# Patient Record
Sex: Female | Born: 1955
Health system: Southern US, Community
[De-identification: ages and names within clinical notes are randomized; demographics above are authoritative.]

## PROBLEM LIST (undated history)

## (undated) DIAGNOSIS — F32A Depression, unspecified: Secondary | ICD-10-CM

## (undated) DIAGNOSIS — I10 Essential (primary) hypertension: Secondary | ICD-10-CM

## (undated) DIAGNOSIS — R519 Headache, unspecified: Secondary | ICD-10-CM

## (undated) DIAGNOSIS — R011 Cardiac murmur, unspecified: Secondary | ICD-10-CM

## (undated) DIAGNOSIS — R0600 Dyspnea, unspecified: Secondary | ICD-10-CM

## (undated) DIAGNOSIS — F1910 Other psychoactive substance abuse, uncomplicated: Secondary | ICD-10-CM

## (undated) DIAGNOSIS — K219 Gastro-esophageal reflux disease without esophagitis: Secondary | ICD-10-CM

## (undated) DIAGNOSIS — T7840XA Allergy, unspecified, initial encounter: Secondary | ICD-10-CM

## (undated) DIAGNOSIS — M1990 Unspecified osteoarthritis, unspecified site: Secondary | ICD-10-CM

## (undated) DIAGNOSIS — F1010 Alcohol abuse, uncomplicated: Secondary | ICD-10-CM

## (undated) DIAGNOSIS — E785 Hyperlipidemia, unspecified: Secondary | ICD-10-CM

## (undated) DIAGNOSIS — D649 Anemia, unspecified: Secondary | ICD-10-CM

## (undated) DIAGNOSIS — G629 Polyneuropathy, unspecified: Secondary | ICD-10-CM

## (undated) DIAGNOSIS — R0902 Hypoxemia: Secondary | ICD-10-CM

## (undated) DIAGNOSIS — T884XXA Failed or difficult intubation, initial encounter: Secondary | ICD-10-CM

## (undated) DIAGNOSIS — R000 Tachycardia, unspecified: Secondary | ICD-10-CM

## (undated) DIAGNOSIS — G709 Myoneural disorder, unspecified: Secondary | ICD-10-CM

## (undated) DIAGNOSIS — Z9689 Presence of other specified functional implants: Secondary | ICD-10-CM

## (undated) DIAGNOSIS — H269 Unspecified cataract: Secondary | ICD-10-CM

## (undated) DIAGNOSIS — F419 Anxiety disorder, unspecified: Secondary | ICD-10-CM

## (undated) DIAGNOSIS — J449 Chronic obstructive pulmonary disease, unspecified: Secondary | ICD-10-CM

## (undated) DIAGNOSIS — M542 Cervicalgia: Secondary | ICD-10-CM

## (undated) DIAGNOSIS — F329 Major depressive disorder, single episode, unspecified: Secondary | ICD-10-CM

## (undated) HISTORY — DX: Depression, unspecified: F32.A

## (undated) HISTORY — DX: Tachycardia, unspecified: R00.0

## (undated) HISTORY — PX: ANTERIOR CERVICAL DECOMP/DISCECTOMY FUSION: SHX1161

## (undated) HISTORY — DX: Anemia, unspecified: D64.9

## (undated) HISTORY — PX: OTHER SURGICAL HISTORY: SHX169

## (undated) HISTORY — DX: Hyperlipidemia, unspecified: E78.5

## (undated) HISTORY — DX: Unspecified cataract: H26.9

## (undated) HISTORY — DX: Cervicalgia: M54.2

## (undated) HISTORY — DX: Myoneural disorder, unspecified: G70.9

## (undated) HISTORY — DX: Other psychoactive substance abuse, uncomplicated: F19.10

## (undated) HISTORY — PX: JOINT REPLACEMENT: SHX530

## (undated) HISTORY — DX: Major depressive disorder, single episode, unspecified: F32.9

## (undated) HISTORY — PX: SPINE SURGERY: SHX786

## (undated) HISTORY — DX: Gastro-esophageal reflux disease without esophagitis: K21.9

## (undated) HISTORY — PX: ESOPHAGOGASTRODUODENOSCOPY: SHX1529

## (undated) HISTORY — DX: Allergy, unspecified, initial encounter: T78.40XA

## (undated) HISTORY — DX: Hypoxemia: R09.02

## (undated) HISTORY — PX: ROTATOR CUFF REPAIR: SHX139

---
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
## 1979-08-18 HISTORY — PX: BREAST ENHANCEMENT SURGERY: SHX7

## 1980-08-17 HISTORY — PX: AUGMENTATION MAMMAPLASTY: SUR837

## 1992-08-17 DIAGNOSIS — K7460 Unspecified cirrhosis of liver: Secondary | ICD-10-CM

## 1992-08-17 HISTORY — DX: Unspecified cirrhosis of liver: K74.60

## 2004-05-26 ENCOUNTER — Ambulatory Visit: Payer: Self-pay | Admitting: Internal Medicine

## 2004-06-05 ENCOUNTER — Ambulatory Visit: Payer: Self-pay | Admitting: Internal Medicine

## 2005-04-03 ENCOUNTER — Ambulatory Visit: Payer: Self-pay

## 2006-06-17 ENCOUNTER — Ambulatory Visit: Payer: Self-pay

## 2007-06-26 DIAGNOSIS — F32A Depression, unspecified: Secondary | ICD-10-CM | POA: Insufficient documentation

## 2007-06-26 DIAGNOSIS — B001 Herpesviral vesicular dermatitis: Secondary | ICD-10-CM | POA: Insufficient documentation

## 2007-06-26 DIAGNOSIS — G4700 Insomnia, unspecified: Secondary | ICD-10-CM | POA: Insufficient documentation

## 2007-06-26 DIAGNOSIS — M19049 Primary osteoarthritis, unspecified hand: Secondary | ICD-10-CM | POA: Insufficient documentation

## 2007-06-26 DIAGNOSIS — F329 Major depressive disorder, single episode, unspecified: Secondary | ICD-10-CM

## 2007-06-26 DIAGNOSIS — N951 Menopausal and female climacteric states: Secondary | ICD-10-CM | POA: Insufficient documentation

## 2007-06-26 DIAGNOSIS — F1011 Alcohol abuse, in remission: Secondary | ICD-10-CM | POA: Insufficient documentation

## 2007-06-26 DIAGNOSIS — K5900 Constipation, unspecified: Secondary | ICD-10-CM | POA: Insufficient documentation

## 2007-06-26 DIAGNOSIS — R000 Tachycardia, unspecified: Secondary | ICD-10-CM | POA: Insufficient documentation

## 2007-09-07 ENCOUNTER — Ambulatory Visit: Payer: Self-pay | Admitting: Family Medicine

## 2007-09-09 DIAGNOSIS — E539 Vitamin B deficiency, unspecified: Secondary | ICD-10-CM | POA: Insufficient documentation

## 2007-10-03 ENCOUNTER — Ambulatory Visit: Payer: Self-pay | Admitting: Orthopaedic Surgery

## 2008-07-18 ENCOUNTER — Ambulatory Visit: Payer: Self-pay | Admitting: Anesthesiology

## 2008-07-31 ENCOUNTER — Ambulatory Visit: Payer: Self-pay | Admitting: Anesthesiology

## 2008-08-21 ENCOUNTER — Ambulatory Visit: Payer: Self-pay | Admitting: Anesthesiology

## 2008-09-19 ENCOUNTER — Ambulatory Visit: Payer: Self-pay | Admitting: Anesthesiology

## 2008-10-12 ENCOUNTER — Ambulatory Visit: Payer: Self-pay | Admitting: Anesthesiology

## 2008-12-05 ENCOUNTER — Ambulatory Visit: Payer: Self-pay | Admitting: Anesthesiology

## 2009-01-02 ENCOUNTER — Ambulatory Visit: Payer: Self-pay | Admitting: Anesthesiology

## 2009-03-04 ENCOUNTER — Ambulatory Visit (HOSPITAL_COMMUNITY): Admission: RE | Admit: 2009-03-04 | Discharge: 2009-03-04 | Payer: Self-pay | Admitting: Neurosurgery

## 2010-04-10 ENCOUNTER — Encounter: Admission: RE | Admit: 2010-04-10 | Discharge: 2010-04-10 | Payer: Self-pay | Admitting: Neurosurgery

## 2010-07-24 ENCOUNTER — Ambulatory Visit: Payer: Self-pay

## 2010-08-27 ENCOUNTER — Encounter: Payer: Self-pay | Admitting: Cardiovascular Disease

## 2010-08-27 ENCOUNTER — Encounter: Payer: Self-pay | Admitting: Internal Medicine

## 2010-08-28 ENCOUNTER — Encounter: Payer: Self-pay | Admitting: Cardiovascular Disease

## 2010-09-22 ENCOUNTER — Ambulatory Visit (INDEPENDENT_AMBULATORY_CARE_PROVIDER_SITE_OTHER): Payer: BC Managed Care – PPO | Admitting: Cardiovascular Disease

## 2010-09-22 ENCOUNTER — Encounter: Payer: Self-pay | Admitting: Cardiovascular Disease

## 2010-09-22 DIAGNOSIS — R9431 Abnormal electrocardiogram [ECG] [EKG]: Secondary | ICD-10-CM | POA: Insufficient documentation

## 2010-09-22 DIAGNOSIS — F17200 Nicotine dependence, unspecified, uncomplicated: Secondary | ICD-10-CM | POA: Insufficient documentation

## 2010-09-22 DIAGNOSIS — R000 Tachycardia, unspecified: Secondary | ICD-10-CM

## 2010-09-22 DIAGNOSIS — I471 Supraventricular tachycardia: Secondary | ICD-10-CM | POA: Insufficient documentation

## 2010-09-22 DIAGNOSIS — R079 Chest pain, unspecified: Secondary | ICD-10-CM | POA: Insufficient documentation

## 2010-09-22 DIAGNOSIS — E785 Hyperlipidemia, unspecified: Secondary | ICD-10-CM

## 2010-10-02 NOTE — Assessment & Plan Note (Signed)
Summary: NP6/CP/AMD   Visit Type:  Initial Consult Primary Provider:  Dr. Nilda Simmer  CC:  c/o fast heart rate and SOB 3 weeks ago. Denies chest pain and pt has not had any symptoms since.Marland Kitchen  History of Present Illness: Gabrielle Pineda is a very pleasant 55 old woman, patient of Dr. Katrinka Blazing and Sharen Hint, with a long history of smoking since age 55, hyperlipidemia, history of tachycardia arrhythmia who presents by referral for her chest pain.  She reports that she has had 3 episodes of tachyarrhythmia in the past. They are not frequent. The most recent episode occurred several weeks ago lasting for 3-1/2 hours. 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 She was able to see her heart beating out of her chest and difficulty breathing with chest pain radiating to her left arm.It went away without intervention.  In the past, she has had episodes are similar requiring an IV medication that I believe is adenosine given by EMTs. The shot helped to break her rhythm back to normal rhythm. Last episode was 6 years ago.  She is active at work though does not perform an exercise program.  The chest pain was described as a tightness in her left chest, radiating into his left arm, while she was having her fast rhythm. Since then she has felt well.  EKG shows normal sinus rhythm with a rate of 67 beats per minute with old septal infarct  Current Medications (verified): 1)  Klor-Con M20 20 Meq Cr-Tabs (Potassium Chloride Crys Cr) .Marland Kitchen.. 1 Tablet Once Daily 2)  Trazodone Hcl 50 Mg Tabs (Trazodone Hcl) .Marland Kitchen.. 1-2 Tablets At Bedtime 3)  Folic Acid 1 Mg Tabs (Folic Acid) .Marland Kitchen.. 1 Tablet Once Daily 4)  Multivitamins  Tabs (Multiple Vitamin) .Marland Kitchen.. 1 Tablet Once Daily 5)  Effexor Xr 75 Mg Xr24h-Cap (Venlafaxine Hcl) .... 3 Tablets Three Times A Day 6)  B Complex  Tabs (B Complex Vitamins) .Marland Kitchen.. 1 Tablet Once Daily 7)  Vitamins A & D 10000-400 Unit Caps (Vitamins  A & D) .Marland Kitchen.. 1 Tablet Once Daily 8)  Vegetable Laxative 63 % Powd (Psyllium) .... Every Day 9)  Fluticasone Propionate 50 Mcg/act Susp (Fluticasone Propionate) .... 2 Nasal Sprays Once Daily 10)  Xanax 0.5 Mg Tabs (Alprazolam) .Marland Kitchen.. 1 Tablet Every Six Hours As Needed For Muscle Spasms 11)  Hydrocodone-Acetaminophen 10-325 Mg Tabs (Hydrocodone-Acetaminophen) .Marland Kitchen.. 1 1/2 Tablet Every 4 To 6 Hours As Needed 12)  Valtrex 1 Gm Tabs (Valacyclovir Hcl) .... 2 Tablets Two Times A Day For 5 Days As Needed 13)  Mucinex Dm 30-600 Mg Xr12h-Tab (Dextromethorphan-Guaifenesin) .Marland Kitchen.. 1 Daily As Needed 14)  Acid Reducer 150mg  .... 1 Tablet As Needed 15)  Advil 200 Mg Tabs (Ibuprofen) .... As Needed 16)  Extra Strength Acetaminophen 500 Mg Caps (Acetaminophen) .... As Needed  Allergies (verified): No Known Drug Allergies  Past History:  Past Medical History: Last updated: 10-19-2010 Cervicalgia Depression GERD Anemia Tachycardia  Past Surgical History: Last updated: 2010-10-19 Neck Disc Surgery-plates and screws in neck Breast augmentation-1981 EGD Tonsils and adenoids removed-1973  Family History: Last updated: 10/19/2010 Father: deceased-pancreatic cancer, substance abuse-67 Mother: deceased-suicide,substance abuse, bipolar,drug overdose-38  Social History: Last updated: 10/19/10 Full Time Married  Tobacco Use -yes-smoked for 30-35 years Alcohol Use - no-recovering alcoholic-since 1994 Regular Exercise - no Drug Use - no  Risk Factors: Exercise: no (October 19, 2010)  Risk Factors: Smoking Status: quit (10/19/2010)  Social History: Full Time Married  Tobacco Use -yes-smoked for 30-35 years Alcohol Use - no-recovering alcoholic-since 1994 Regular Exercise - no Drug Use - no  Review of Systems       The patient complains of chest pain.  The patient denies fever, weight loss, weight gain, vision loss, decreased hearing, hoarseness, syncope, dyspnea on exertion, peripheral edema,  prolonged cough, abdominal pain, incontinence, muscle weakness, depression, and enlarged lymph nodes.         tachycardia  Vital Signs:  Patient profile:   55 year old female Height:      69 inches Weight:      145.50 pounds BMI:     21.56 Pulse rate:   67 / minute BP sitting:   148 / 86  (left arm) Cuff size:   regular  Vitals Entered By: Lysbeth Galas CMA (September 22, 2010 2:32 PM)  Physical Exam  General:  Well developed, well nourished, in no acute distress. Head:  normocephalic and atraumatic Neck:  Neck supple, no JVD. No masses, thyromegaly or abnormal cervical nodes. Lungs:  Clear bilaterally to auscultation and percussion. Heart:  Non-displaced PMI, chest non-tender; regular rate and rhythm, S1, S2 without murmurs, rubs or gallops. Carotid upstroke normal, no bruit. Pedals normal pulses. No edema, no varicosities. Abdomen:  Bowel sounds positive; abdomen soft and non-tender without masses Msk:  Back normal, normal gait. Muscle strength and tone normal. Pulses:  pulses normal in all 4 extremities Extremities:  No clubbing or cyanosis. Neurologic:  Alert and oriented x 3. Skin:  Intact without lesions or rashes. Psych:  Normal affect.   Impression & Recommendations:  Problem # 1:  CHEST PAIN UNSPECIFIED (ICD-786.50)  etiology for chest pain is concerning for underlying coronary artery disease. It is uncertain if this was angina exacerbated by her rapid heart rate. She may have underlying coronary artery disease as she does have significant risk factors with close to 40 years of smoking and very elevated cholesterol. Only recently started on a cholesterol medication.  She has chest pain, abnormal EKG, hyperlipidemia and long smoking history. we will add aspirin 81 mg daily.  We will schedule her for a treadmill Myoview to be done at her convenience. She would like to do this on the week that she is off in March. I suggested that she contact us if she has further  symptoms and we would set up the test prior to then.  Her updated medication list for this problem includes:    Aspir-low 81 Mg Tbec (Aspirin) .Marland Kitchen... Take one tablet once daily  Her updated medication list for this problem includes:    Aspir-low 81 Mg Tbec (Aspirin) .Marland Kitchen... Take one tablet once daily  Orders: Nuclear Stress Test (Nuc Stress Test)  Problem # 2:  TACHYCARDIA (ICD-785) I suspect her tachycardia could be secondary to an SVT or atrial tachycardia. She reports previous episodes terminated by injection of the medication which I suspect is the adenosine.  She does not want to start on a medication for rhythm control at this time. We did teach her maneuvers such as carotid sinus massage and Valsalva to help break her rhythm. I suggested she try to capture her rhythm on a telemetry strip either by EMTs, fire department or emergency room the next time she has arrhythmia.  Problem # 3:  HYPERLIPIDEMIA (ICD-272.4)  I suggested that she stay on her cholesterol medication, recheck her cholesterol in 2 months time with further medication adjustment. Ideal goal LDL should be less than 100.  Orders: Nuclear  Stress Test (Nuc Stress Test)  Problem # 4:  SMOKER (ICD-305.1) we have talked to her about her smoking. I suggested she followup with Dr. Katrinka Blazing. Options include Chantix, nicotine patches, electronic cigarette. I did discuss the complications of smoking, including higher heart attack and stroke risk.  Other Orders: EKG w/ Interpretation (93000)  Patient Instructions: 1)  Your physician recommends that you schedule a follow-up appointment in: You will be called with Myoview results. 2)  Your physician has requested that you have an exercise stress myoview.  For further information please visit [email or web address]  Please follow instruction sheet, as given. 3)  Your physician recommends that you continue on your current medications as directed. Please refer to the Current Medication  list given to you today.

## 2010-10-28 ENCOUNTER — Telehealth (INDEPENDENT_AMBULATORY_CARE_PROVIDER_SITE_OTHER): Payer: Self-pay | Admitting: *Deleted

## 2010-10-28 NOTE — Letter (Signed)
Summary: [facility] Office Visit Note   [facility] Office Visit Note   Imported By: Roderic Ovens 10/20/2010 15:13:55  _____________________________________________________________________  External Attachment:    Type:   Image     Comment:   External Document

## 2010-10-29 ENCOUNTER — Ambulatory Visit (HOSPITAL_COMMUNITY): Payer: BC Managed Care – PPO | Attending: Cardiology

## 2010-10-29 ENCOUNTER — Encounter: Payer: Self-pay | Admitting: *Deleted

## 2010-10-29 ENCOUNTER — Encounter: Payer: Self-pay | Admitting: Cardiology

## 2010-10-29 ENCOUNTER — Encounter (INDEPENDENT_AMBULATORY_CARE_PROVIDER_SITE_OTHER): Payer: Self-pay | Admitting: *Deleted

## 2010-10-29 DIAGNOSIS — R0609 Other forms of dyspnea: Secondary | ICD-10-CM

## 2010-10-29 DIAGNOSIS — R9431 Abnormal electrocardiogram [ECG] [EKG]: Secondary | ICD-10-CM | POA: Insufficient documentation

## 2010-10-29 DIAGNOSIS — R0989 Other specified symptoms and signs involving the circulatory and respiratory systems: Secondary | ICD-10-CM

## 2010-10-29 DIAGNOSIS — R079 Chest pain, unspecified: Secondary | ICD-10-CM | POA: Insufficient documentation

## 2010-10-29 DIAGNOSIS — I498 Other specified cardiac arrhythmias: Secondary | ICD-10-CM | POA: Insufficient documentation

## 2010-10-29 DIAGNOSIS — E785 Hyperlipidemia, unspecified: Secondary | ICD-10-CM | POA: Insufficient documentation

## 2010-10-29 DIAGNOSIS — R0602 Shortness of breath: Secondary | ICD-10-CM

## 2010-11-04 NOTE — Assessment & Plan Note (Addendum)
Summary: Cardiology Nuclear Testing  Nuclear Med Background Indications for Stress Test: Evaluation for Ischemia, Abnormal EKG     Symptoms: Chest Pain, Chest Tightness, DOE, Fatigue, Palpitations, Rapid HR    Nuclear Pre-Procedure Cardiac Risk Factors: Lipids, Smoker Caffeine/Decaff Intake: None NPO After: 8:30 PM Lungs: clear IV 0.9% NS with Angio Cath: 18g     IV Site: R Antecubital IV Started by: Stanton Kidney, EMT-P Chest Size (in) 38     Cup Size B     Height (in): 69 Weight (lb): 136 BMI: 20.16  Nuclear Med Study 1 or 2 day study:  1 day     Stress Test Type:  Stress Reading MD:  Cassell Clement, MD     Referring MD:  T.Gollan Resting Radionuclide:  Technetium 56m Tetrofosmin     Resting Radionuclide Dose:  11 mCi  Stress Radionuclide:  Technetium 37m Tetrofosmin     Stress Radionuclide Dose:  33 mCi   Stress Protocol Exercise Time (min):  5:30 min     Max HR:  150 bpm     Predicted Max HR:  166 bpm  Max Systolic BP: 175 mm Hg     Percent Max HR:  90.36 %     METS: 7.0 Rate Pressure Product:  16109    Stress Test Technologist:  Milana Na, EMT-P     Nuclear Technologist:  Domenic Polite, CNMT  Rest Procedure  Myocardial perfusion imaging was performed at rest 45 minutes following the intravenous administration of Technetium 68m Tetrofosmin.  Stress Procedure  The patient exercised for 5:30. The patient stopped due to fatigue and denied any chest pain.  There were non specific ST-T wave changes.  Technetium 69m Tetrofosmin was injected at peak exercise and myocardial perfusion imaging was performed after a brief delay.  QPS Raw Data Images:  Normal; no motion artifact; normal heart/lung ratio. Stress Images:  Normal homogeneous uptake in all areas of the myocardium. Rest Images:  Normal homogeneous uptake in all areas of the myocardium. Subtraction (SDS):  No evidence of ischemia. Transient Ischemic Dilatation:  1.08  (Normal <1.22)  Lung/Heart Ratio:  .31   (Normal <0.45)  Quantitative Gated Spect Images QGS EDV:  68 ml QGS ESV:  31 ml QGS EF:  55 % QGS cine images:  Normal wall motion.  Findings Normal nuclear study      Overall Impression  Exercise Capacity: Fair exercise capacity. BP Response: Normal blood pressure response. Clinical Symptoms: No chest pain ECG Impression: No significant ST segment change suggestive of ischemia. Overall Impression: Normal stress nuclear study.  Appended Document: Cardiology Nuclear Testing Normal stress test  Appended Document: Cardiology Nuclear Testing [facility] TCB/sab  Appended Document: Cardiology Nuclear Testing pt notified/sab

## 2010-11-04 NOTE — Progress Notes (Signed)
Summary: Nuclear Pre-Procedure  Phone Note Outgoing Call Call back at [facility] Phone [phone]   Call placed by: Stanton Kidney, EMT-P,  October 28, 2010 2:48 PM Call placed to: Patient Action Taken: Phone Call Completed Summary of Call: Reviewed information on Myoview Information Sheet (see scanned document for further details).  Spoke with the patient. Stanton Kidney, EMT-P  October 28, 2010 2:51 PM      Nuclear Med Background Indications for Stress Test: Evaluation for Ischemia, Abnormal EKG     Symptoms: Chest Pain, Chest Tightness, Rapid HR    Nuclear Pre-Procedure Cardiac Risk Factors: Lipids, Smoker Height (in): 69

## 2011-02-12 ENCOUNTER — Ambulatory Visit: Payer: Self-pay | Admitting: Family Medicine

## 2011-03-02 ENCOUNTER — Encounter: Payer: Self-pay | Admitting: Cardiovascular Disease

## 2011-03-24 ENCOUNTER — Ambulatory Visit: Payer: Self-pay | Admitting: Family Medicine

## 2011-03-26 ENCOUNTER — Ambulatory Visit: Payer: Self-pay | Admitting: Family Medicine

## 2011-04-17 ENCOUNTER — Ambulatory Visit: Payer: Self-pay | Admitting: Unknown Physician Specialty

## 2011-04-23 LAB — PATHOLOGY REPORT

## 2011-05-21 ENCOUNTER — Ambulatory Visit: Payer: Self-pay | Admitting: Family Medicine

## 2011-08-18 HISTORY — PX: CARPAL TUNNEL RELEASE: SHX101

## 2011-11-11 ENCOUNTER — Other Ambulatory Visit: Payer: Self-pay

## 2011-11-11 ENCOUNTER — Encounter (HOSPITAL_COMMUNITY): Payer: Self-pay | Admitting: *Deleted

## 2011-11-11 ENCOUNTER — Encounter (HOSPITAL_COMMUNITY): Payer: Self-pay | Admitting: Pharmacy Technician

## 2011-11-11 ENCOUNTER — Ambulatory Visit (HOSPITAL_COMMUNITY)
Admission: AD | Admit: 2011-11-11 | Discharge: 2011-11-11 | Disposition: A | Discharge status: Home / Self Care | Payer: BC Managed Care – PPO | Source: Ambulatory Visit | Attending: Neurosurgery | Admitting: Neurosurgery

## 2011-11-11 ENCOUNTER — Encounter (HOSPITAL_COMMUNITY)
Admission: AD | Disposition: A | Discharge status: Home / Self Care | Payer: Self-pay | Source: Ambulatory Visit | Attending: Neurosurgery

## 2011-11-11 ENCOUNTER — Ambulatory Visit (HOSPITAL_COMMUNITY): Payer: BC Managed Care – PPO | Admitting: Anesthesiology

## 2011-11-11 ENCOUNTER — Other Ambulatory Visit: Payer: Self-pay | Admitting: Neurosurgery

## 2011-11-11 ENCOUNTER — Encounter (HOSPITAL_COMMUNITY): Payer: Self-pay | Admitting: Anesthesiology

## 2011-11-11 ENCOUNTER — Encounter (HOSPITAL_COMMUNITY): Payer: BC Managed Care – PPO

## 2011-11-11 DIAGNOSIS — G5600 Carpal tunnel syndrome, unspecified upper limb: Secondary | ICD-10-CM | POA: Insufficient documentation

## 2011-11-11 DIAGNOSIS — Z01812 Encounter for preprocedural laboratory examination: Secondary | ICD-10-CM | POA: Insufficient documentation

## 2011-11-11 DIAGNOSIS — Z01810 Encounter for preprocedural cardiovascular examination: Secondary | ICD-10-CM | POA: Insufficient documentation

## 2011-11-11 DIAGNOSIS — F329 Major depressive disorder, single episode, unspecified: Secondary | ICD-10-CM | POA: Insufficient documentation

## 2011-11-11 DIAGNOSIS — K219 Gastro-esophageal reflux disease without esophagitis: Secondary | ICD-10-CM | POA: Insufficient documentation

## 2011-11-11 DIAGNOSIS — F3289 Other specified depressive episodes: Secondary | ICD-10-CM | POA: Insufficient documentation

## 2011-11-11 DIAGNOSIS — Z01818 Encounter for other preprocedural examination: Secondary | ICD-10-CM | POA: Insufficient documentation

## 2011-11-11 DIAGNOSIS — J449 Chronic obstructive pulmonary disease, unspecified: Secondary | ICD-10-CM | POA: Insufficient documentation

## 2011-11-11 DIAGNOSIS — I499 Cardiac arrhythmia, unspecified: Secondary | ICD-10-CM | POA: Insufficient documentation

## 2011-11-11 DIAGNOSIS — J4489 Other specified chronic obstructive pulmonary disease: Secondary | ICD-10-CM | POA: Insufficient documentation

## 2011-11-11 HISTORY — PX: CARPAL TUNNEL RELEASE: SHX101

## 2011-11-11 HISTORY — DX: Cardiac murmur, unspecified: R01.1

## 2011-11-11 LAB — CBC
HCT: 34.5 % — ABNORMAL LOW (ref 36.0–46.0)
Hemoglobin: 11.5 g/dL — ABNORMAL LOW (ref 12.0–15.0)
MCHC: 33.3 g/dL (ref 30.0–36.0)
MCV: 97.7 fL (ref 78.0–100.0)
RDW: 12.3 % (ref 11.5–15.5)
WBC: 7.1 10*3/uL (ref 4.0–10.5)

## 2011-11-11 LAB — BASIC METABOLIC PANEL
BUN: 5 mg/dL — ABNORMAL LOW (ref 6–23)
CO2: 24 mEq/L (ref 19–32)
Chloride: 108 mEq/L (ref 96–112)
Creatinine, Ser: 0.62 mg/dL (ref 0.50–1.10)

## 2011-11-11 SURGERY — CARPAL TUNNEL RELEASE
Anesthesia: Monitor Anesthesia Care | Site: Arm Lower | Laterality: Right | Wound class: Clean

## 2011-11-11 MED ORDER — CEFAZOLIN SODIUM 1-5 GM-% IV SOLN: 1.0000 g | INTRAVENOUS | Status: DC

## 2011-11-11 MED ORDER — CEFAZOLIN SODIUM 1-5 GM-% IV SOLN
INTRAVENOUS | Status: AC
  Filled 2011-11-11: qty 50

## 2011-11-11 MED ORDER — MORPHINE SULFATE 2 MG/ML IJ SOLN: 0.0500 mg/kg | INTRAMUSCULAR | Status: DC | PRN

## 2011-11-11 MED ORDER — MIDAZOLAM HCL 5 MG/5ML IJ SOLN
INTRAMUSCULAR | Status: DC | PRN
  Administered 2011-11-11: 2 mg via INTRAVENOUS

## 2011-11-11 MED ORDER — PROPOFOL 10 MG/ML IV EMUL
INTRAVENOUS | Status: DC | PRN
  Administered 2011-11-11: 80 mg via INTRAVENOUS

## 2011-11-11 MED ORDER — BUPIVACAINE-EPINEPHRINE 0.5% -1:200000 IJ SOLN
INTRAMUSCULAR | Status: DC | PRN
  Administered 2011-11-11: 20 mL

## 2011-11-11 MED ORDER — LACTATED RINGERS IV SOLN
INTRAVENOUS | Status: DC | PRN
  Administered 2011-11-11: 15:00:00 via INTRAVENOUS

## 2011-11-11 MED ORDER — CEFAZOLIN SODIUM 1-5 GM-% IV SOLN
INTRAVENOUS | Status: DC | PRN
  Administered 2011-11-11: 1 g via INTRAVENOUS

## 2011-11-11 MED ORDER — FENTANYL CITRATE 0.05 MG/ML IJ SOLN
INTRAMUSCULAR | Status: DC | PRN
  Administered 2011-11-11 (×2): 50 ug via INTRAVENOUS

## 2011-11-11 MED ORDER — MUPIROCIN 2 % EX OINT
TOPICAL_OINTMENT | Freq: Two times a day (BID) | CUTANEOUS | Status: DC
  Administered 2011-11-11: 1 via NASAL
  Filled 2011-11-11: qty 22

## 2011-11-11 MED ORDER — LIDOCAINE HCL (CARDIAC) 20 MG/ML IV SOLN
INTRAVENOUS | Status: DC | PRN
  Administered 2011-11-11: 80 mg via INTRAVENOUS

## 2011-11-11 MED ORDER — BACITRACIN ZINC 500 UNIT/GM EX OINT
TOPICAL_OINTMENT | CUTANEOUS | Status: DC | PRN
  Administered 2011-11-11: 1 via TOPICAL

## 2011-11-11 MED ORDER — MUPIROCIN 2 % EX OINT
TOPICAL_OINTMENT | CUTANEOUS | Status: AC
  Administered 2011-11-11: 1 via NASAL
  Filled 2011-11-11: qty 22

## 2011-11-11 MED ORDER — HYDROMORPHONE HCL PF 1 MG/ML IJ SOLN: 0.2500 mg | INTRAMUSCULAR | Status: DC | PRN

## 2011-11-11 MED ORDER — LACTATED RINGERS IV SOLN: INTRAVENOUS | Status: DC

## 2011-11-11 SURGICAL SUPPLY — 48 items
BANDAGE GAUZE ELAST BULKY 4 IN (GAUZE/BANDAGES/DRESSINGS) ×2 IMPLANT
BLADE SURG 15 STRL LF DISP TIS (BLADE) ×1 IMPLANT
BLADE SURG 15 STRL SS (BLADE) ×1
CLOTH BEACON ORANGE TIMEOUT ST (SAFETY) ×2 IMPLANT
CORDS BIPOLAR (ELECTRODE) ×2 IMPLANT
DECANTER SPIKE VIAL GLASS SM (MISCELLANEOUS) ×2 IMPLANT
DRAPE EXTREMITY T 121X128X90 (DRAPE) ×2 IMPLANT
DURAPREP 26ML APPLICATOR (WOUND CARE) ×4 IMPLANT
GAUZE SPONGE 4X4 16PLY XRAY LF (GAUZE/BANDAGES/DRESSINGS) ×2 IMPLANT
GLOVE BIO SURGEON STRL SZ 6.5 (GLOVE) IMPLANT
GLOVE BIO SURGEON STRL SZ7 (GLOVE) IMPLANT
GLOVE BIO SURGEON STRL SZ7.5 (GLOVE) IMPLANT
GLOVE BIO SURGEON STRL SZ8 (GLOVE) IMPLANT
GLOVE BIO SURGEON STRL SZ8.5 (GLOVE) IMPLANT
GLOVE BIOGEL M 8.0 STRL (GLOVE) ×2 IMPLANT
GLOVE BIOGEL PI IND STRL 8 (GLOVE) ×1 IMPLANT
GLOVE BIOGEL PI INDICATOR 8 (GLOVE) ×1
GLOVE ECLIPSE 6.5 STRL STRAW (GLOVE) IMPLANT
GLOVE ECLIPSE 7.0 STRL STRAW (GLOVE) IMPLANT
GLOVE ECLIPSE 7.5 STRL STRAW (GLOVE) ×4 IMPLANT
GLOVE ECLIPSE 8.0 STRL XLNG CF (GLOVE) IMPLANT
GLOVE ECLIPSE 8.5 STRL (GLOVE) IMPLANT
GLOVE EXAM NITRILE LRG STRL (GLOVE) IMPLANT
GLOVE EXAM NITRILE MD LF STRL (GLOVE) IMPLANT
GLOVE EXAM NITRILE XL STR (GLOVE) IMPLANT
GLOVE EXAM NITRILE XS STR PU (GLOVE) IMPLANT
GLOVE INDICATOR 6.5 STRL GRN (GLOVE) IMPLANT
GLOVE INDICATOR 7.0 STRL GRN (GLOVE) IMPLANT
GLOVE INDICATOR 7.5 STRL GRN (GLOVE) IMPLANT
GLOVE INDICATOR 8.0 STRL GRN (GLOVE) IMPLANT
GLOVE INDICATOR 8.5 STRL (GLOVE) IMPLANT
GLOVE OPTIFIT SS 8.0 STRL (GLOVE) IMPLANT
GLOVE SURG SS PI 6.5 STRL IVOR (GLOVE) IMPLANT
GOWN BRE IMP SLV AUR LG STRL (GOWN DISPOSABLE) ×2 IMPLANT
GOWN BRE IMP SLV AUR XL STRL (GOWN DISPOSABLE) IMPLANT
GOWN STRL REIN 2XL LVL4 (GOWN DISPOSABLE) ×2 IMPLANT
KIT BASIN OR (CUSTOM PROCEDURE TRAY) ×2 IMPLANT
KIT ROOM TURNOVER OR (KITS) ×2 IMPLANT
NEEDLE HYPO 25X1 1.5 SAFETY (NEEDLE) ×2 IMPLANT
NS IRRIG 1000ML POUR BTL (IV SOLUTION) ×2 IMPLANT
PACK SURGICAL SETUP 50X90 (CUSTOM PROCEDURE TRAY) ×2 IMPLANT
PAD ARMBOARD 7.5X6 YLW CONV (MISCELLANEOUS) ×2 IMPLANT
SPONGE GAUZE 4X4 12PLY (GAUZE/BANDAGES/DRESSINGS) ×2 IMPLANT
SUT ETHILON 3 0 PS 1 (SUTURE) ×2 IMPLANT
SYR BULB 3OZ (MISCELLANEOUS) ×2 IMPLANT
SYR CONTROL 10ML LL (SYRINGE) ×2 IMPLANT
TOWEL OR 17X24 6PK STRL BLUE (TOWEL DISPOSABLE) ×6 IMPLANT
WATER STERILE IRR 1000ML POUR (IV SOLUTION) ×2 IMPLANT

## 2011-11-11 NOTE — OR Nursing (Signed)
Patient's ring unable to come off.  Patient explained risks associated with keeping ring on.

## 2011-11-11 NOTE — Progress Notes (Signed)
DR Jeral Fruit IN TO SEE CLIENT AND NOTIFIED RIGHT HAND NUMB AND PAIN RIGHT UPPER ARM AND NO NEW ORDERS NOTED AND OK TO D/C HOME PER DR Jeral Fruit

## 2011-11-11 NOTE — Anesthesia Postprocedure Evaluation (Signed)
  Anesthesia Post-op Note  Patient: Gabrielle Pineda  Procedure(s) Performed: Procedure(s) (LRB): CARPAL TUNNEL RELEASE (Right)  Patient Location: PACU  Anesthesia Type: General  Level of Consciousness: awake  Airway and Oxygen Therapy: Patient Spontanous Breathing  Post-op Pain: mild  Post-op Assessment: Post-op Vital signs reviewed  Post-op Vital Signs: stable  Complications: No apparent anesthesia complications

## 2011-11-11 NOTE — Discharge Summary (Signed)
  Pre and post surgery dx: CTS  PROCEDURE decompression of right median nerve. MEDICATION PERCOCET. Diet normal. Condition at dc, stable. Follow up in two weeks. Activity, no restrictions.

## 2011-11-11 NOTE — Anesthesia Postprocedure Evaluation (Signed)
  Anesthesia Post-op Note  Patient: Gabrielle Pineda  Procedure(s) Performed: Procedure(s) (LRB): CARPAL TUNNEL RELEASE (Right)  Patient Location: PACU  Anesthesia Type: MAC  Level of Consciousness: awake  Airway and Oxygen Therapy: Patient Spontanous Breathing  Post-op Pain: mild  Post-op Assessment: Post-op Vital signs reviewed  Post-op Vital Signs: stable  Complications: No apparent anesthesia complications

## 2011-11-11 NOTE — Transfer of Care (Signed)
Immediate Anesthesia Transfer of Care Note  Patient: Gabrielle Pineda  Procedure(s) Performed: Procedure(s) (LRB): CARPAL TUNNEL RELEASE (Right)  Patient Location: PACU  Anesthesia Type: MAC  Level of Consciousness: awake, alert  and oriented  Airway & Oxygen Therapy: Patient Spontanous Breathing  Post-op Assessment: Report given to PACU RN  Post vital signs: Reviewed and stable  Complications: No apparent anesthesia complications

## 2011-11-11 NOTE — H&P (Signed)
Gabrielle Pineda is an 55 y.o. female.   Chief Complaint:pain right hand. HPI  Pain and weakness in right hand associated with tingling . Past Medical History  Diagnosis Date  . Cervicalgia   . Depression   . GERD (gastroesophageal reflux disease)   . Anemia   . Tachycardia     d/t questionable anxiety happens every 5- 10 years, sees [facility]  . Heart murmur     on heard when pt is lying    Past Surgical History  Procedure Date  . Neck disc surgery     plates and screws in neck   . Breast enhancement surgery 1981  . Esophagogastroduodenoscopy   . Tonsillectomy and adenoidectomy 1973     Family History  Problem Relation Age of Onset  . Anesthesia problems Neg Hx    Social History:  reports that she has been smoking.  She does not have any smokeless tobacco history on file. She reports that she does not drink alcohol or use illicit drugs.  Allergies: No Known Allergies  Medications Prior to Admission  Medication Dose Route Frequency Provider Last Rate Last Dose  . ceFAZolin (ANCEF) IVPB 1 g/50 mL premix  1 g Intravenous 60 min Pre-Op [name], MD      . mupirocin ointment (BACTROBAN) 2 %   Nasal BID [name], MD   1 application at 11/11/11 1350   Medications Prior to Admission  Medication Sig Dispense Refill  . ALPRAZolam (XANAX) 0.5 MG tablet Take 0.5 mg by mouth every 6 (six) hours as needed. For anxiety      . aspirin 81 MG EC tablet Take 81 mg by mouth daily.        Marland Kitchen b complex vitamins tablet Take 1 tablet by mouth daily.        . fluticasone (FLONASE) 50 MCG/ACT nasal spray Place 2 sprays into the nose daily.        Marland Kitchen ibuprofen (ADVIL,MOTRIN) 200 MG tablet Take 200 mg by mouth every 6 (six) hours as needed. For pain      . Multiple Vitamin (MULTIVITAMIN) tablet Take 1 tablet by mouth daily.        . ranitidine (ZANTAC) 150 MG tablet Take 150 mg by mouth as needed. For indegestion      . rosuvastatin (CRESTOR) 5 MG tablet Take 5 mg by  mouth daily.        . traZODone (DESYREL) 50 MG tablet Take 25-100 mg by mouth at bedtime.       Marland Kitchen venlafaxine (EFFEXOR-XR) 75 MG 24 hr capsule Take 225 mg by mouth 3 (three) times daily.        . Vitamins A & D (VITAMIN A & D) 10000-400 UNITS CAPS Take 1 capsule by mouth daily.          Results for orders placed during the hospital encounter of 11/11/11 (from the past 48 hour(s))  BASIC METABOLIC PANEL     Status: Abnormal   Collection Time   11/11/11  1:40 PM      Component Value Range Comment   Sodium 141  135 - 145 (mEq/L)    Potassium 3.6  3.5 - 5.1 (mEq/L)    Chloride 108  96 - 112 (mEq/L)    CO2 24  19 - 32 (mEq/L)    Glucose, Bld 87  70 - 99 (mg/dL)    BUN 5 (*) 6 - 23 (mg/dL)    Creatinine, Ser 2.95  0.50 -  1.10 (mg/dL)    Calcium 9.5  8.4 - 10.5 (mg/dL)    GFR calc non Af Amer >90  >90 (mL/min)    GFR calc Af Amer >90  >90 (mL/min)   CBC     Status: Abnormal   Collection Time   11/11/11  1:40 PM      Component Value Range Comment   WBC 7.1  4.0 - 10.5 (K/uL)    RBC 3.53 (*) 3.87 - 5.11 (MIL/uL)    Hemoglobin 11.5 (*) 12.0 - 15.0 (g/dL)    HCT 45.4 (*) 09.8 - 46.0 (%)    MCV 97.7  78.0 - 100.0 (fL)    MCH 32.6  26.0 - 34.0 (pg)    MCHC 33.3  30.0 - 36.0 (g/dL)    RDW 11.9  14.7 - 82.9 (%)    Platelets 221  150 - 400 (K/uL)    Dg Chest 2 View  11/11/2011  *RADIOLOGY REPORT*  Clinical Data: Tachycardia.  CHEST - 2 VIEW  Comparison: None.  Findings: Trachea is midline.  Heart size normal.  Lungs are hyperinflated but clear.  No pleural fluid.  IMPRESSION: No acute findings.  Original Report Authenticated By: Reyes Ivan, M.D.    Review of Systems  Constitutional: Negative.   HENT: Positive for neck pain.   Eyes: Negative.   Respiratory: Negative.   Cardiovascular: Negative.   Gastrointestinal: Negative.   Genitourinary: Negative.   Skin: Negative.   Neurological: Positive for tingling.  Endo/Heme/Allergies: Negative.   Psychiatric/Behavioral: Negative.      Blood pressure 122/77, pulse 59, temperature 97.8 F (36.6 C), temperature source Oral, resp. rate 20, SpO2 97.00%. Physical Exam hent, nl. Neck, anterior scar. Lungs, nl. Cv, nl. Abdomen, nl. Extremities  Right hand with tinnel sign positive, weakness of thenar muscle. ncv posirtive for CTS  Assessment/Plan  DECOMPRESSION OF RIGHT MEDIAN NERVE  Rohil Lesch M 11/11/2011, 3:01 PM

## 2011-11-11 NOTE — Discharge Instructions (Signed)
Carpal Tunnel Release (Repair) Care After Refer to this sheet in the next few weeks. These discharge instructions provide you with general information on caring for yourself after you leave the hospital. Your caregiver may also give you specific instructions. Your treatment has been planned according to the most current medical practices available, but unavoidable complications sometimes occur. If you have any problems or questions after discharge, please call your caregiver. HOME CARE INSTRUCTIONS   Have a responsible person with you for 24 hours.   Do not drive a car or take public transportation for 24 hours.   Only take over-the-counter or prescription medicines for pain, discomfort, or fever as directed by your caregiver. Take them as directed.   You may put ice on the palm side of the affected wrist.   Put ice in a plastic bag.   Place a towel between your skin and the bag.   Leave the ice on for 15 to 20 minutes, 3 to 4 times per day.   If you were given a splint to keep your wrist from bending, use it as directed. It is important to wear the splint at night or as directed. Use the splint for as long as you have pain or numbness in your hand, arm, or wrist. This may take 1 to 2 months.   Keep your hand raised (elevated) above the level of your heart as much as possible. This keeps swelling down and helps with discomfort.   Change bandages (dressings) as directed.   Keep the wound clean and dry.  SEEK MEDICAL CARE IF:   You develop pain not relieved with medicines.   You develop numbness of your hand.   You develop bleeding from your surgical site.   You have an oral temperature above 102 F (38.9 C).   You develop redness or swelling of the surgical site.   You develop new, unexplained problems.  SEEK IMMEDIATE MEDICAL CARE IF:   You develop a rash.   You have difficulty breathing.   You develop any reaction or side effects to medicines given.  MAKE SURE YOU:    Understand these instructions.   Will watch your condition.   Will get help right away if you are not doing well or get worse.  Document Released: 02/20/2005 Document Revised: 07/23/2011 Document Reviewed: 06/09/2007 [facility] Patient Information 2012 [place].

## 2011-11-11 NOTE — Progress Notes (Signed)
Patient ID: Gabrielle Pineda, female   DOB: 03-15-1956, 56 y.o.   MRN: [id number] Decompression of the median nerve was done. Op note [phone]

## 2011-11-11 NOTE — Anesthesia Preprocedure Evaluation (Addendum)
Anesthesia Evaluation  Patient identified by MRN, date of birth, ID band Patient awake    Airway Mallampati: II      Dental   Pulmonary COPD breath sounds clear to auscultation        Cardiovascular + dysrhythmias - Valvular Problems/MurmursRhythm:Regular Rate:Normal  Patient history of tachycardia in past. Asymtomatic with no symtoms recently. Work up negative as per patient by a cardiologist that included a stress test.   Neuro/Psych Depression negative neurological ROS     GI/Hepatic negative GI ROS, Neg liver ROS, GERD-  ,  Endo/Other  negative endocrine ROS  Renal/GU negative Renal ROS     Musculoskeletal   Abdominal   Peds  Hematology negative hematology ROS (+)   Anesthesia Other Findings   Reproductive/Obstetrics                           Anesthesia Physical Anesthesia Plan  ASA: II  Anesthesia Plan: MAC and Regional   Post-op Pain Management:    Induction: Intravenous  Airway Management Planned: LMA and Mask  Additional Equipment:   Intra-op Plan:   Post-operative Plan: Extubation in OR  Informed Consent: I have reviewed the patients History and Physical, chart, labs and discussed the procedure including the risks, benefits and alternatives for the proposed anesthesia with the patient or authorized representative who has indicated his/her understanding and acceptance.     Plan Discussed with: CRNA  Anesthesia Plan Comments:        Anesthesia Quick Evaluation

## 2011-11-12 ENCOUNTER — Encounter (HOSPITAL_COMMUNITY): Payer: Self-pay | Admitting: Neurosurgery

## 2011-11-12 NOTE — Op Note (Signed)
NAMEKYLEIGH, NANNINI             ACCOUNT NO.:  [id number]  MEDICAL RECORD NO.:  [id number]  LOCATION:  [facility]                         FACILITY:  [facility]  PHYSICIAN:  Hilda Lias, M.D.   DATE OF BIRTH:  1955-09-22  DATE OF PROCEDURE:  11/11/2011 DATE OF DISCHARGE:  11/11/2011                              OPERATIVE REPORT   PREOPERATIVE DIAGNOSIS:  Right carpal tunnel syndrome.  POSTOPERATIVE DIAGNOSIS:  Right carpal tunnel syndrome.  PROCEDURE:  Decompression of the right median nerve.  SURGEON:  Hilda Lias, MD  CLINICAL HISTORY:  Ms. Mozingo has been complaining of weakness of the right hand associated with pain and _numbness_________.  The patient had nerve conduction test which showed right carpal tunnel syndrome _in the right_________ secondary to compression of the right median nerve.  Because the pain is getting worse and keeping her awake at nighttime, she wants to proceed with surgery.  The surgery was fully explained to her including the__________ risk of no improvement whatsoever and need for more surgery__________.  PROCEDURE:  The patient was taken to the OR and the right hand was prepped with DuraPrep.  Drapes were applied.  Infiltration with__________ Xylocaine ___along_______ base of the thumb was done__________.  Then, incision _following thwe _________ base of the thumb __thru________ skin, subcutaneous tissue, volar ligament _and a_________ thick calcified ligament was done.  Decompression was _achieved_________.  Because of the calcification of the ligament, we _resected part of the _________ the ligament _to prevent_________ reattachment.  At the end, we had plenty of room for the median nerve.  Hemostasis was done__________ bipolar. The area was irrigated and the wound was closed with nylon.  The patient is going to go to PACU _and discharge when stable_________.          ______________________________ Hilda Lias, M.D.     EB/MEDQ  D:   11/11/2011  T:  11/12/2011  Job:  [id number]

## 2011-12-18 ENCOUNTER — Encounter: Payer: Self-pay | Admitting: *Deleted

## 2011-12-24 ENCOUNTER — Ambulatory Visit (INDEPENDENT_AMBULATORY_CARE_PROVIDER_SITE_OTHER): Payer: BC Managed Care – PPO | Admitting: Cardiovascular Disease

## 2011-12-24 ENCOUNTER — Encounter: Payer: Self-pay | Admitting: Cardiovascular Disease

## 2011-12-24 DIAGNOSIS — R0989 Other specified symptoms and signs involving the circulatory and respiratory systems: Secondary | ICD-10-CM

## 2011-12-24 DIAGNOSIS — E785 Hyperlipidemia, unspecified: Secondary | ICD-10-CM

## 2011-12-24 DIAGNOSIS — F17200 Nicotine dependence, unspecified, uncomplicated: Secondary | ICD-10-CM

## 2011-12-24 DIAGNOSIS — R079 Chest pain, unspecified: Secondary | ICD-10-CM

## 2011-12-24 NOTE — Assessment & Plan Note (Signed)
We have encouraged her to continue to work on weaning her cigarettes and smoking cessation. She will continue to work on this and does not want any assistance with chantix.  

## 2011-12-24 NOTE — Assessment & Plan Note (Signed)
No recent chest pain  

## 2011-12-24 NOTE — Progress Notes (Signed)
Patient ID: Gabrielle Pineda, female    DOB: November 22, 1955, 56 y.o.   MRN: [id number]  HPI Comments: Gabrielle Pineda is a very pleasant 56 old woman, patient of Dr. Nilda Pineda and Gabrielle Pineda, with a long history of smoking since age 22, hyperlipidemia, history of tachycardia/possible SVT  at times requiring adenosine? (details not available), and chest pain who presents for routine follow up.   She denies any recent tachycardia or chest pain. She feels well with no complaints. She is trying to quit smoking and has bought something over the counter to help her quit. She still smokes 1 ppd. She has had recent carpel Tunnel release surgery on the right. She is active at work though does not perform an exercise program.   EKG shows normal sinus rhythm with a rate of 64 beats per minute with old septal infarct       Outpatient Encounter Prescriptions as of 12/24/2011  Medication Sig Dispense Refill  . ALPRAZolam (XANAX) 0.5 MG tablet Take 0.5 mg by mouth every 6 (six) hours as needed. For anxiety      . aspirin 81 MG EC tablet Take 81 mg by mouth daily.        Marland Kitchen b complex vitamins tablet Take 1 tablet by mouth daily.        . diazepam (VALIUM) 5 MG tablet Take 5 mg by mouth every 6 (six) hours as needed.      . fluticasone (FLONASE) 50 MCG/ACT nasal spray Place 2 sprays into the nose daily.        Marland Kitchen guaiFENesin (MUCINEX) 600 MG 12 hr tablet Take 1,200 mg by mouth 2 (two) times daily.      Marland Kitchen ibuprofen (ADVIL,MOTRIN) 200 MG tablet Take 200 mg by mouth every 6 (six) hours as needed. For pain      . Multiple Vitamin (MULTIVITAMIN) tablet Take 1 tablet by mouth daily.        . Nutritional Supplements (ESTROVEN ENERGY PO) Take by mouth daily.      Marland Kitchen omeprazole (PRILOSEC) 20 MG capsule Take 20 mg by mouth daily.      . phenazopyridine (PYRIDIUM) 95 MG tablet Take 95 mg by mouth 2 (two) times daily.      . rosuvastatin (CRESTOR) 5 MG tablet Take 5 mg by mouth daily.        . traZODone (DESYREL) 50  MG tablet Take 25-100 mg by mouth at bedtime.       . ValACYclovir HCl (VALTREX PO) Take 1 g by mouth.      . venlafaxine (EFFEXOR-XR) 75 MG 24 hr capsule Take 225 mg by mouth 3 (three) times daily.        . Vitamins A & D (VITAMIN A & D) 10000-400 UNITS CAPS Take 1 capsule by mouth daily.         Review of Systems  Constitutional: Negative.   HENT: Negative.   Eyes: Negative.   Respiratory: Negative.   Cardiovascular: Negative.   Gastrointestinal: Negative.   Musculoskeletal: Positive for arthralgias.  Skin: Negative.   Neurological: Negative.   Hematological: Negative.   Psychiatric/Behavioral: Negative.   All other systems reviewed and are negative.     BP 124/79  Pulse 67  Ht 5\' 9"  (1.753 m)  Wt 140 lb (63.504 kg)  BMI 20.67 kg/m2  Physical Exam  Nursing note and vitals reviewed. Constitutional: She is oriented to person, place, and time. She appears well-developed and well-nourished.  HENT:  Head: Normocephalic.  Nose: Nose normal.  Mouth/Throat: Oropharynx is clear and moist.  Eyes: Conjunctivae are normal. Pupils are equal, round, and reactive to light.  Neck: Normal range of motion. Neck supple. No JVD present.  Cardiovascular: Normal rate, regular rhythm, S1 normal, S2 normal, normal heart sounds and intact distal pulses.  Exam reveals no gallop and no friction rub.   No murmur heard. Pulmonary/Chest: Effort normal and breath sounds normal. No respiratory distress. She has no wheezes. She has no rales. She exhibits no tenderness.  Abdominal: Soft. Bowel sounds are normal. She exhibits no distension. There is no tenderness.  Musculoskeletal: Normal range of motion. She exhibits no edema and no tenderness.  Lymphadenopathy:    She has no cervical adenopathy.  Neurological: She is alert and oriented to person, place, and time. Coordination normal.  Skin: Skin is warm and dry. No rash noted. No erythema.  Psychiatric: She has a normal mood and affect. Her behavior  is normal. Judgment and thought content normal.         Assessment and Plan

## 2011-12-24 NOTE — Patient Instructions (Signed)
You are doing well. No medication changes were made.  Please call us if you have new issues that need to be addressed before your next appt.  Your physician wants you to follow-up in: 12 months.  You will receive a reminder letter in the mail two months in advance. If you don't receive a letter, please call our office to schedule the follow-up appointment. 

## 2011-12-24 NOTE — Assessment & Plan Note (Signed)
Encouraged her to stay on crestor. If lipitor is less expensive, she could possible change over.

## 2011-12-24 NOTE — Assessment & Plan Note (Signed)
No recent tachycardia over the past year. Rare short runs. No further workup at this time. I have suggested she call the office if symptoms get worse.

## 2012-12-13 ENCOUNTER — Other Ambulatory Visit: Payer: Self-pay | Admitting: Neurosurgery

## 2012-12-13 DIAGNOSIS — M542 Cervicalgia: Secondary | ICD-10-CM

## 2012-12-13 DIAGNOSIS — M5410 Radiculopathy, site unspecified: Secondary | ICD-10-CM

## 2012-12-20 ENCOUNTER — Ambulatory Visit
Admission: RE | Admit: 2012-12-20 | Discharge: 2012-12-20 | Disposition: A | Discharge status: Home / Self Care | Payer: BC Managed Care – PPO | Source: Ambulatory Visit | Attending: Neurosurgery | Admitting: Neurosurgery

## 2012-12-20 VITALS — BP 142/84 | HR 66

## 2012-12-20 DIAGNOSIS — M542 Cervicalgia: Secondary | ICD-10-CM

## 2012-12-20 DIAGNOSIS — M5410 Radiculopathy, site unspecified: Secondary | ICD-10-CM

## 2012-12-20 MED ORDER — ONDANSETRON HCL 4 MG/2ML IJ SOLN
4.0000 mg | Freq: Once | INTRAMUSCULAR | Status: AC
  Administered 2012-12-20: 4 mg via INTRAMUSCULAR

## 2012-12-20 MED ORDER — DIAZEPAM 5 MG PO TABS
10.0000 mg | ORAL_TABLET | Freq: Once | ORAL | Status: AC
  Administered 2012-12-20: 10 mg via ORAL

## 2012-12-20 MED ORDER — IOHEXOL 300 MG/ML  SOLN
10.0000 mL | Freq: Once | INTRAMUSCULAR | Status: AC | PRN
  Administered 2012-12-20: 10 mL via INTRATHECAL

## 2012-12-20 MED ORDER — HYDROMORPHONE HCL PF 1 MG/ML IJ SOLN
1.0000 mg | Freq: Once | INTRAMUSCULAR | Status: AC
  Administered 2012-12-20: 1 mg via INTRAMUSCULAR

## 2012-12-20 NOTE — Progress Notes (Signed)
Pt states she has been off effexor and trazodone for the past 3 days.

## 2013-02-21 ENCOUNTER — Encounter: Payer: Self-pay | Admitting: Cardiovascular Disease

## 2013-02-21 ENCOUNTER — Ambulatory Visit (INDEPENDENT_AMBULATORY_CARE_PROVIDER_SITE_OTHER): Payer: BC Managed Care – PPO | Admitting: Cardiovascular Disease

## 2013-02-21 VITALS — BP 128/80 | HR 71 | Ht 69.0 in | Wt 135.5 lb

## 2013-02-21 DIAGNOSIS — F17200 Nicotine dependence, unspecified, uncomplicated: Secondary | ICD-10-CM

## 2013-02-21 DIAGNOSIS — I471 Supraventricular tachycardia: Secondary | ICD-10-CM

## 2013-02-21 DIAGNOSIS — R9431 Abnormal electrocardiogram [ECG] [EKG]: Secondary | ICD-10-CM

## 2013-02-21 DIAGNOSIS — E785 Hyperlipidemia, unspecified: Secondary | ICD-10-CM

## 2013-02-21 NOTE — Progress Notes (Signed)
Patient ID: Gabrielle Pineda, female    DOB: 1956/04/21, 57 y.o.   MRN: [id number]  HPI Comments: [name] is a very pleasant 57 old woman with a long history of smoking since age 57, hyperlipidemia, history of tachycardia/possible SVT  at times requiring adenosine? (details not available), and chest pain who presents for routine follow up.  She reports that she continues to smoke one pack per day. She denies any recent tachycardia or palpitations. She is scheduled for neck fusion with plate placement in several days' time in [place]. She has had similar surgery in the past but she has recurrent pain. She is active, works at [employer] without any chest pain or shortness of breath. She is trying to quit smoking and has bought something over the counter to help her quit.  EKG shows normal sinus rhythm with a rate of 71 beats per minute with no significant ST or T wave changes Lab work from January 2014 shows total cholesterol 196, LDL 114, HDL 59    Outpatient Encounter Prescriptions as of 02/21/2013  Medication Sig Dispense Refill  . b complex vitamins tablet Take 1 tablet by mouth daily.        . diazepam (VALIUM) 5 MG tablet Take 5 mg by mouth every 6 (six) hours as needed.      Marland Kitchen guaiFENesin (MUCINEX) 600 MG 12 hr tablet Take 1,200 mg by mouth 2 (two) times daily.      Marland Kitchen ibuprofen (ADVIL,MOTRIN) 200 MG tablet Take 200 mg by mouth every 6 (six) hours as needed. For pain      . Multiple Vitamin (MULTIVITAMIN) tablet Take 1 tablet by mouth daily.        . Nutritional Supplements (ESTROVEN ENERGY PO) Take by mouth daily.      Marland Kitchen omeprazole (PRILOSEC) 20 MG capsule Take 20 mg by mouth daily.      Marland Kitchen oxyCODONE (ROXICODONE) 15 MG immediate release tablet Take 15 mg by mouth every 4 (four) hours as needed.       . rosuvastatin (CRESTOR) 5 MG tablet Take 5 mg by mouth daily.        . traZODone (DESYREL) 50 MG tablet Take 25-100 mg by mouth at bedtime.       . ValACYclovir HCl (VALTREX PO)  Take 1 g by mouth.      . venlafaxine (EFFEXOR-XR) 75 MG 24 hr capsule Take 225 mg by mouth 3 (three) times daily.        . VENTOLIN HFA 108 (90 BASE) MCG/ACT inhaler Inhale 2 puffs into the lungs every 4 (four) hours as needed.       . Vitamins A & D (VITAMIN A & D) 10000-400 UNITS CAPS Take 1 capsule by mouth daily.          Review of Systems  Constitutional: Negative.   HENT: Negative.   Eyes: Negative.   Respiratory: Negative.   Cardiovascular: Negative.   Gastrointestinal: Negative.   Musculoskeletal: Positive for arthralgias.  Skin: Negative.   Neurological: Negative.   Psychiatric/Behavioral: Negative.   All other systems reviewed and are negative.     BP 128/80  Pulse 71  Ht 5\' 9"  (1.753 m)  Wt 135 lb 8 oz (61.462 kg)  BMI 20 kg/m2  Physical Exam  Nursing note and vitals reviewed. Constitutional: She is oriented to person, place, and time. She appears well-developed and well-nourished.  HENT:  Head: Normocephalic.  Nose: Nose normal.  Mouth/Throat: Oropharynx is clear and moist.  Eyes: Conjunctivae are normal. Pupils are equal, round, and reactive to light.  Neck: Normal range of motion. Neck supple. No JVD present.  Cardiovascular: Normal rate, regular rhythm, S1 normal, S2 normal, normal heart sounds and intact distal pulses.  Exam reveals no gallop and no friction rub.   No murmur heard. Pulmonary/Chest: Effort normal and breath sounds normal. No respiratory distress. She has no wheezes. She has no rales. She exhibits no tenderness.  Abdominal: Soft. Bowel sounds are normal. She exhibits no distension. There is no tenderness.  Musculoskeletal: Normal range of motion. She exhibits no edema and no tenderness.  Lymphadenopathy:    She has no cervical adenopathy.  Neurological: She is alert and oriented to person, place, and time. Coordination normal.  Skin: Skin is warm and dry. No rash noted. No erythema.  Psychiatric: She has a normal mood and affect. Her  behavior is normal. Judgment and thought content normal.    Assessment and Plan

## 2013-02-21 NOTE — Patient Instructions (Addendum)
You are doing well. No medication changes were made.  Please call us if you have new issues that need to be addressed before your next appt.  Your physician wants you to follow-up in: 12 months.  You will receive a reminder letter in the mail two months in advance. If you don't receive a letter, please call our office to schedule the follow-up appointment. 

## 2013-02-21 NOTE — Assessment & Plan Note (Signed)
We have encouraged her to stay on Crestor given she is high risk for coronary artery disease and PAD. Ideally we would push the LDL lower given her continued heavy smoking.

## 2013-02-21 NOTE — Assessment & Plan Note (Signed)
No recent episodes of tachycardia. No medication changes made. No further testing.

## 2013-02-21 NOTE — Assessment & Plan Note (Signed)
We have encouraged her to continue to work on weaning her cigarettes and smoking cessation. She will continue to work on this and does not want any assistance with chantix.  

## 2013-02-27 ENCOUNTER — Encounter: Payer: Self-pay | Admitting: *Deleted

## 2013-06-08 ENCOUNTER — Ambulatory Visit: Payer: Self-pay | Admitting: Internal Medicine

## 2013-06-16 ENCOUNTER — Ambulatory Visit: Payer: Self-pay | Admitting: Internal Medicine

## 2013-06-16 LAB — LACTATE DEHYDROGENASE: LDH: 195 U/L (ref 81–246)

## 2013-06-16 LAB — CBC CANCER CENTER
Basophil: 1 %
Comment - H1-Com1: NORMAL
Comment - H1-Com2: NORMAL
HCT: 41.4 % (ref 35.0–47.0)
HGB: 13.8 g/dL (ref 12.0–16.0)
Lymphocytes: 28 %
MCH: 32.7 pg (ref 26.0–34.0)
MCHC: 33.2 g/dL (ref 32.0–36.0)
MCV: 99 fL (ref 80–100)
Platelet: 276 x10 3/mm (ref 150–440)
RBC: 4.2 10*6/uL (ref 3.80–5.20)
RDW: 13.1 % (ref 11.5–14.5)
Segmented Neutrophils: 61 %
WBC: 5.8 x10 3/mm (ref 3.6–11.0)

## 2013-06-16 LAB — RETICULOCYTES: Reticulocyte: 1.09 % (ref 0.4–3.1)

## 2013-06-17 ENCOUNTER — Ambulatory Visit: Payer: Self-pay | Admitting: Internal Medicine

## 2013-06-19 LAB — PROT IMMUNOELECTROPHORES(ARMC)

## 2013-06-20 LAB — URINE IEP, RANDOM

## 2013-06-22 ENCOUNTER — Other Ambulatory Visit: Payer: Self-pay

## 2013-07-06 ENCOUNTER — Ambulatory Visit: Payer: Self-pay | Admitting: Unknown Physician Specialty

## 2013-07-10 ENCOUNTER — Ambulatory Visit: Payer: Self-pay | Admitting: Family Medicine

## 2013-07-11 LAB — PATHOLOGY REPORT

## 2013-08-09 ENCOUNTER — Encounter: Payer: Self-pay | Admitting: Cardiology

## 2013-09-28 ENCOUNTER — Ambulatory Visit: Payer: Self-pay | Admitting: Unknown Physician Specialty

## 2013-09-28 LAB — HM COLONOSCOPY

## 2013-10-02 LAB — PATHOLOGY REPORT

## 2013-12-22 ENCOUNTER — Ambulatory Visit (INDEPENDENT_AMBULATORY_CARE_PROVIDER_SITE_OTHER): Payer: BC Managed Care – PPO | Admitting: Cardiovascular Disease

## 2013-12-22 ENCOUNTER — Encounter: Payer: Self-pay | Admitting: Cardiovascular Disease

## 2013-12-22 VITALS — BP 100/60 | HR 76 | Ht 69.0 in | Wt 136.2 lb

## 2013-12-22 DIAGNOSIS — R0602 Shortness of breath: Secondary | ICD-10-CM | POA: Insufficient documentation

## 2013-12-22 DIAGNOSIS — I471 Supraventricular tachycardia: Secondary | ICD-10-CM

## 2013-12-22 DIAGNOSIS — F17200 Nicotine dependence, unspecified, uncomplicated: Secondary | ICD-10-CM

## 2013-12-22 DIAGNOSIS — E785 Hyperlipidemia, unspecified: Secondary | ICD-10-CM

## 2013-12-22 DIAGNOSIS — R079 Chest pain, unspecified: Secondary | ICD-10-CM

## 2013-12-22 NOTE — Patient Instructions (Signed)
You are doing well. No medication changes were made.  Please call us if you have new issues that need to be addressed before your next appt.  Your physician wants you to follow-up in: 12 months.  You will receive a reminder letter in the mail two months in advance. If you don't receive a letter, please call our office to schedule the follow-up appointment. 

## 2013-12-22 NOTE — Assessment & Plan Note (Signed)
No recent chest pain symptoms.

## 2013-12-22 NOTE — Assessment & Plan Note (Signed)
We have encouraged her to continue to work on weaning her cigarettes and smoking cessation. She will continue to work on this and does not want any assistance with chantix.  

## 2013-12-22 NOTE — Assessment & Plan Note (Signed)
She has chronic cough, likely chronic bronchitis. Worsening shortness of breath last week I suspect from possible COPD exacerbation. She has a mild wheeze. No productive sputum. She is taking albuterol as needed for her symptoms. We have suggested she followup with Dr. Caryn Section as she may need other inhalers such as Symbicort. Suggested she call our office or Dr. Caryn Section if symptoms get worse. She might need prednisone taper

## 2013-12-22 NOTE — Progress Notes (Signed)
Patient ID: Gabrielle Pineda, female    DOB: 10-26-1955, 58 y.o.   MRN: [id number]  HPI Comments: Ms. Melendrez is a very pleasant 58 yo old woman with a long history of smoking since age 41, hyperlipidemia, history of tachycardia/possible SVT  at times requiring adenosine? (details not available), and chest pain who presents for routine follow up. She has chronic neck and shoulder pain. Prior surgeries.  In followup today, she reports that she has had recent episodes of shortness of breath sometimes associated with fast heart rate. She has taken albuterol with mild relief of her symptoms. She reports a light wheeze in her upper airway. Symptoms mildly better this week. She has a chronic cough, no significant change, no productive sputum. Denies having any chest pain with exertion.  She does report having chronic neck and shoulder pain. She requested a cortisone shot from Dr. Jefm Bryant. She was unable to get an appointment She reports that she continues to smoke one pack per day. She did not like the electronic cigarette  She is active, works at [employer]   EKG shows normal sinus rhythm with a rate of 76 beats per minute with no significant ST or T wave changes Lab work from January 2014 shows total cholesterol 196, LDL 114, HDL 59 Lab work from February 2015 shows total cholesterol 212, LDL 136, HDL 66   Outpatient Encounter Prescriptions as of 12/22/2013  Medication Sig  . b complex vitamins tablet Take 1 tablet by mouth daily.    Marland Kitchen guaiFENesin (MUCINEX) 600 MG 12 hr tablet Take 1,200 mg by mouth 2 (two) times daily.  Marland Kitchen HYDROXYCHLOROQUINE SULFATE PO Take 2 tablets daily.  Marland Kitchen ibuprofen (ADVIL,MOTRIN) 200 MG tablet Take 200 mg by mouth every 6 (six) hours as needed. For pain  . meloxicam (MOBIC) 7.5 MG tablet Take 7.5 mg by mouth daily.  . Multiple Vitamin (MULTIVITAMIN) tablet Take 1 tablet by mouth daily.    . Nutritional Supplements (ESTROVEN ENERGY PO) Take by mouth daily.  Marland Kitchen omeprazole  (PRILOSEC) 20 MG capsule Take 20 mg by mouth daily.  Marland Kitchen PRAVASTATIN SODIUM PO Take by mouth.  . sertraline (ZOLOFT) 100 MG tablet Take 100 mg by mouth daily.  . traZODone (DESYREL) 50 MG tablet Take 25-100 mg by mouth at bedtime.   . ValACYclovir HCl (VALTREX PO) Take 1 g by mouth.  . venlafaxine (EFFEXOR-XR) 75 MG 24 hr capsule Take 225 mg by mouth daily.   . VENTOLIN HFA 108 (90 BASE) MCG/ACT inhaler Inhale 2 puffs into the lungs every 4 (four) hours as needed.    Review of Systems  Constitutional: Negative.   HENT: Negative.   Eyes: Negative.   Respiratory: Positive for cough and shortness of breath.   Cardiovascular: Negative.   Gastrointestinal: Negative.   Endocrine: Negative.   Musculoskeletal: Positive for arthralgias and neck pain.  Skin: Negative.   Allergic/Immunologic: Negative.   Neurological: Negative.   Hematological: Negative.   Psychiatric/Behavioral: Negative.   All other systems reviewed and are negative.    BP 100/60  Pulse 76  Ht 5\' 9"  (1.753 m)  Wt 136 lb 4 oz (61.803 kg)  BMI 20.11 kg/m2  Physical Exam  Nursing note and vitals reviewed. Constitutional: She is oriented to person, place, and time. She appears well-developed and well-nourished.  HENT:  Head: Normocephalic.  Nose: Nose normal.  Mouth/Throat: Oropharynx is clear and moist.  Eyes: Conjunctivae are normal. Pupils are equal, round, and reactive to light.  Neck: Normal range  of motion. Neck supple. No JVD present.  Cardiovascular: Normal rate, regular rhythm, S1 normal, S2 normal, normal heart sounds and intact distal pulses.  Exam reveals no gallop and no friction rub.   No murmur heard. Pulmonary/Chest: Effort normal. No respiratory distress. She has decreased breath sounds. She has no wheezes. She has no rales. She exhibits no tenderness.  Abdominal: Soft. Bowel sounds are normal. She exhibits no distension. There is no tenderness.  Musculoskeletal: Normal range of motion. She exhibits no  edema and no tenderness.  Lymphadenopathy:    She has no cervical adenopathy.  Neurological: She is alert and oriented to person, place, and time. Coordination normal.  Skin: Skin is warm and dry. No rash noted. No erythema.  Psychiatric: She has a normal mood and affect. Her behavior is normal. Judgment and thought content normal.    Assessment and Plan

## 2013-12-22 NOTE — Assessment & Plan Note (Signed)
Recommend that she stay on her pravastatin. Most recent lipid panel after she started pravastatin not available. Prior lipids she reports were without any statin

## 2013-12-22 NOTE — Assessment & Plan Note (Signed)
She does report symptoms of fast heart rate. This is likely a sinus tachycardia, secondary to underlying shortness of breath. No changes in her medications made.

## 2014-01-30 DIAGNOSIS — M1990 Unspecified osteoarthritis, unspecified site: Secondary | ICD-10-CM | POA: Insufficient documentation

## 2014-01-30 DIAGNOSIS — M069 Rheumatoid arthritis, unspecified: Secondary | ICD-10-CM | POA: Insufficient documentation

## 2014-02-12 ENCOUNTER — Ambulatory Visit: Payer: Self-pay | Admitting: Rheumatology

## 2014-08-08 ENCOUNTER — Emergency Department: Payer: Self-pay | Admitting: Emergency Medicine

## 2014-08-15 DIAGNOSIS — S62523A Displaced fracture of distal phalanx of unspecified thumb, initial encounter for closed fracture: Secondary | ICD-10-CM | POA: Insufficient documentation

## 2014-10-11 DIAGNOSIS — K219 Gastro-esophageal reflux disease without esophagitis: Secondary | ICD-10-CM | POA: Insufficient documentation

## 2014-12-31 ENCOUNTER — Ambulatory Visit
Admission: RE | Admit: 2014-12-31 | Discharge: 2014-12-31 | Disposition: A | Discharge status: Home / Self Care | Payer: BLUE CROSS/BLUE SHIELD | Source: Ambulatory Visit | Attending: Physician Assistant | Admitting: Physician Assistant

## 2014-12-31 ENCOUNTER — Other Ambulatory Visit: Payer: Self-pay | Admitting: Physician Assistant

## 2014-12-31 DIAGNOSIS — J440 Chronic obstructive pulmonary disease with acute lower respiratory infection: Secondary | ICD-10-CM | POA: Insufficient documentation

## 2015-01-22 ENCOUNTER — Other Ambulatory Visit: Payer: Self-pay | Admitting: *Deleted

## 2015-01-22 MED ORDER — VENLAFAXINE HCL ER 75 MG PO CP24: 225.0000 mg | ORAL_CAPSULE | Freq: Every day | ORAL | Status: DC

## 2015-01-22 NOTE — Telephone Encounter (Signed)
Refill request for venlafaxine er 75mg  Last filled by MD on- 08/15/2014 #90 x4 Last Appt: 12/31/2014-[facility]  Next Appt: 04/04/2015-[facility] Please advise refill?

## 2015-01-30 ENCOUNTER — Other Ambulatory Visit: Payer: Self-pay

## 2015-02-01 ENCOUNTER — Encounter: Payer: Self-pay | Admitting: Cardiovascular Disease

## 2015-02-01 ENCOUNTER — Ambulatory Visit (INDEPENDENT_AMBULATORY_CARE_PROVIDER_SITE_OTHER): Payer: BLUE CROSS/BLUE SHIELD | Admitting: Cardiovascular Disease

## 2015-02-01 VITALS — BP 124/68 | HR 89 | Ht 69.0 in | Wt 139.0 lb

## 2015-02-01 DIAGNOSIS — I471 Supraventricular tachycardia, unspecified: Secondary | ICD-10-CM

## 2015-02-01 DIAGNOSIS — Z720 Tobacco use: Secondary | ICD-10-CM

## 2015-02-01 DIAGNOSIS — F17200 Nicotine dependence, unspecified, uncomplicated: Secondary | ICD-10-CM

## 2015-02-01 DIAGNOSIS — M069 Rheumatoid arthritis, unspecified: Secondary | ICD-10-CM | POA: Diagnosis not present

## 2015-02-01 NOTE — Assessment & Plan Note (Signed)
She has follow-up with rheumatology

## 2015-02-01 NOTE — Assessment & Plan Note (Signed)
No significant arrhythmia on today's visit. No changes to her medications If she has recurrent arrhythmia, could prescribe diltiazem 30 mg when necessary or propranolol when necessary. This was discussed with her

## 2015-02-01 NOTE — Progress Notes (Signed)
Patient ID: Gabrielle Pineda, female    DOB: 01-03-1956, 59 y.o.   MRN: [id number]  HPI Comments: Gabrielle Pineda is a very pleasant 59 yo old woman with a long history of smoking since age 62, hyperlipidemia, history of tachycardia/possible SVT  at times requiring adenosine? (details not available), and chest pain who presents for routine follow up of her tachycardia. She has chronic neck and shoulder pain. Prior surgeries.  In follow up today, she denies any recent episodes of tachycardia. She continues to smoke one half pack per day Works at [employer] She has follow-up with rheumatology for rheumatoid arthritis. Currently on methotrexate No further episodes of shortness of breath Denies having any chest pain with exertion.  EKG on today's visit shows normal sinus rhythm with rate 89 bpm, no significant ST or T-wave changes  Other past medical history  She does report having chronic neck and shoulder pain.   EKG shows normal sinus rhythm with a rate of 76 beats per minute with no significant ST or T wave changes Lab work from January 2014 shows total cholesterol 196, LDL 114, HDL 59 Lab work from February 2015 shows total cholesterol 212, LDL 136, HDL 66   Allergies  Allergen Reactions  . Hydroxychloroquine Sulfate Rash    Current Outpatient Prescriptions on File Prior to Visit  Medication Sig Dispense Refill  . b complex vitamins tablet Take 1 tablet by mouth daily.      . folic acid (FOLVITE) 1 MG tablet Take 1 tablet by mouth daily.    Marland Kitchen guaiFENesin (MUCINEX) 600 MG 12 hr tablet Take 1,200 mg by mouth 2 (two) times daily.    Marland Kitchen HYDROXYCHLOROQUINE SULFATE PO Take 2 tablets daily.    Marland Kitchen ibuprofen (ADVIL,MOTRIN) 200 MG tablet Take 200 mg by mouth every 6 (six) hours as needed. For pain    . meloxicam (MOBIC) 7.5 MG tablet Take 7.5 mg by mouth daily.    . Multiple Vitamin (MULTIVITAMIN) tablet Take 1 tablet by mouth daily.      . Nutritional Supplements (ESTROVEN ENERGY PO) Take by  mouth daily.    Marland Kitchen omeprazole (PRILOSEC) 20 MG capsule Take 20 mg by mouth daily.    Marland Kitchen PRAVASTATIN SODIUM PO Take by mouth.    . sertraline (ZOLOFT) 100 MG tablet Take 100 mg by mouth daily.    . traZODone (DESYREL) 50 MG tablet Take 25-100 mg by mouth at bedtime.     Marland Kitchen venlafaxine XR (EFFEXOR-XR) 75 MG 24 hr capsule Take 3 capsules (225 mg total) by mouth daily. 90 capsule 1  . [DISCONTINUED] potassium chloride SA (K-DUR,KLOR-CON) 20 MEQ tablet Take 20 mEq by mouth daily.       No current facility-administered medications on file prior to visit.    Past Medical History  Diagnosis Date  . Cervicalgia   . Depression   . GERD (gastroesophageal reflux disease)   . Anemia   . Tachycardia     d/t questionable anxiety happens every 5- 10 years, sees [facility]  . Heart murmur     on heard when pt is lying  . Other and unspecified hyperlipidemia     Past Surgical History  Procedure Laterality Date  . Neck disc surgery      plates and screws in neck x 2  . Breast enhancement surgery  1981  . Esophagogastroduodenoscopy    . Tonsillectomy and adenoidectomy  1973   . Carpal tunnel release  11/11/2011    Procedure: CARPAL TUNNEL  RELEASE;  Surgeon: Floyce Stakes, MD;  Location: [facility];  Service: Neurosurgery;  Laterality: Right;  Right Median Nerve Decompression  . Carpal tunnel release  2013    Social History  reports that she has been smoking.  She does not have any smokeless tobacco history on file. She reports that she does not drink alcohol or use illicit drugs.  Family History family history includes Alcohol abuse in her father and mother; Bipolar disorder in her mother; Pancreatic cancer in her father; Suicidality in her mother. There is no history of Anesthesia problems.       Review of Systems  Constitutional: Negative.   HENT: Negative.   Eyes: Negative.   Respiratory: Negative.   Cardiovascular: Negative.   Gastrointestinal: Negative.   Endocrine:  Negative.   Musculoskeletal: Positive for arthralgias and neck pain.  Skin: Negative.   Allergic/Immunologic: Negative.   Neurological: Negative.   Hematological: Negative.   Psychiatric/Behavioral: Negative.   All other systems reviewed and are negative.    BP 124/68 mmHg  Pulse 89  Ht 5\' 9"  (1.753 m)  Wt 139 lb (63.05 kg)  BMI 20.52 kg/m2  Physical Exam  Constitutional: She is oriented to person, place, and time. She appears well-developed and well-nourished.  HENT:  Head: Normocephalic.  Nose: Nose normal.  Mouth/Throat: Oropharynx is clear and moist.  Eyes: Conjunctivae are normal. Pupils are equal, round, and reactive to light.  Neck: Normal range of motion. Neck supple. No JVD present.  Cardiovascular: Normal rate, regular rhythm, S1 normal, S2 normal, normal heart sounds and intact distal pulses.  Exam reveals no gallop and no friction rub.   No murmur heard. Pulmonary/Chest: Effort normal. No respiratory distress. She has decreased breath sounds. She has no wheezes. She has no rales. She exhibits no tenderness.  Abdominal: Soft. Bowel sounds are normal. She exhibits no distension. There is no tenderness.  Musculoskeletal: Normal range of motion. She exhibits no edema or tenderness.  Lymphadenopathy:    She has no cervical adenopathy.  Neurological: She is alert and oriented to person, place, and time. Coordination normal.  Skin: Skin is warm and dry. No rash noted. No erythema.  Psychiatric: She has a normal mood and affect. Her behavior is normal. Judgment and thought content normal.    Assessment and Plan  Nursing note and vitals reviewed.

## 2015-02-01 NOTE — Assessment & Plan Note (Signed)
We have encouraged her to continue to work on weaning her cigarettes and smoking cessation. She will continue to work on this and does not want any assistance with chantix.  

## 2015-02-01 NOTE — Patient Instructions (Signed)
You are doing well. No medication changes were made.  Please call us if you have new issues that need to be addressed before your next appt.  Your physician wants you to follow-up in: 12 months.  You will receive a reminder letter in the mail two months in advance. If you don't receive a letter, please call our office to schedule the follow-up appointment. 

## 2015-02-18 ENCOUNTER — Other Ambulatory Visit: Payer: Self-pay | Admitting: Family Medicine

## 2015-03-19 ENCOUNTER — Other Ambulatory Visit: Payer: Self-pay | Admitting: Family Medicine

## 2015-03-28 ENCOUNTER — Other Ambulatory Visit: Payer: Self-pay | Admitting: Neurosurgery

## 2015-03-28 DIAGNOSIS — M542 Cervicalgia: Secondary | ICD-10-CM

## 2015-03-29 DIAGNOSIS — G5600 Carpal tunnel syndrome, unspecified upper limb: Secondary | ICD-10-CM | POA: Insufficient documentation

## 2015-04-03 ENCOUNTER — Other Ambulatory Visit: Payer: Self-pay

## 2015-04-03 DIAGNOSIS — R3911 Hesitancy of micturition: Secondary | ICD-10-CM | POA: Insufficient documentation

## 2015-04-03 DIAGNOSIS — E7800 Pure hypercholesterolemia, unspecified: Secondary | ICD-10-CM | POA: Insufficient documentation

## 2015-04-03 DIAGNOSIS — R634 Abnormal weight loss: Secondary | ICD-10-CM | POA: Insufficient documentation

## 2015-04-03 DIAGNOSIS — Z800 Family history of malignant neoplasm of digestive organs: Secondary | ICD-10-CM | POA: Insufficient documentation

## 2015-04-03 DIAGNOSIS — E28319 Asymptomatic premature menopause: Secondary | ICD-10-CM | POA: Insufficient documentation

## 2015-04-04 ENCOUNTER — Ambulatory Visit: Payer: Self-pay | Admitting: Physician Assistant

## 2015-04-04 ENCOUNTER — Ambulatory Visit
Admission: RE | Admit: 2015-04-04 | Discharge: 2015-04-04 | Disposition: A | Discharge status: Home / Self Care | Payer: BLUE CROSS/BLUE SHIELD | Source: Ambulatory Visit | Attending: Neurosurgery | Admitting: Neurosurgery

## 2015-04-04 DIAGNOSIS — M542 Cervicalgia: Secondary | ICD-10-CM

## 2015-04-04 MED ORDER — MEPERIDINE HCL 100 MG/ML IJ SOLN
75.0000 mg | Freq: Once | INTRAMUSCULAR | Status: AC
  Administered 2015-04-04: 75 mg via INTRAMUSCULAR

## 2015-04-04 MED ORDER — ONDANSETRON HCL 4 MG/2ML IJ SOLN
4.0000 mg | Freq: Once | INTRAMUSCULAR | Status: AC
  Administered 2015-04-04: 4 mg via INTRAMUSCULAR

## 2015-04-04 MED ORDER — DIAZEPAM 5 MG PO TABS
10.0000 mg | ORAL_TABLET | Freq: Once | ORAL | Status: AC
  Administered 2015-04-04: 10 mg via ORAL

## 2015-04-04 MED ORDER — IOHEXOL 300 MG/ML  SOLN
10.0000 mL | Freq: Once | INTRAMUSCULAR | Status: DC | PRN
  Administered 2015-04-04: 10 mL via INTRATHECAL

## 2015-04-04 NOTE — Progress Notes (Signed)
Pt states she has been off Sertraline, Trazodone and Effexor for the past 2 days. Discharge instructions explained to pt,

## 2015-04-04 NOTE — Discharge Instructions (Signed)

## 2015-04-08 ENCOUNTER — Ambulatory Visit: Payer: Self-pay | Admitting: Physician Assistant

## 2015-04-10 ENCOUNTER — Encounter: Payer: Self-pay | Admitting: Physician Assistant

## 2015-04-10 ENCOUNTER — Ambulatory Visit (INDEPENDENT_AMBULATORY_CARE_PROVIDER_SITE_OTHER): Payer: BLUE CROSS/BLUE SHIELD | Admitting: Physician Assistant

## 2015-04-10 VITALS — BP 116/70 | HR 83 | Temp 97.9°F | Resp 16 | Wt 140.2 lb

## 2015-04-10 DIAGNOSIS — Z87891 Personal history of nicotine dependence: Secondary | ICD-10-CM

## 2015-04-10 DIAGNOSIS — Z720 Tobacco use: Secondary | ICD-10-CM

## 2015-04-10 DIAGNOSIS — F17200 Nicotine dependence, unspecified, uncomplicated: Secondary | ICD-10-CM

## 2015-04-10 DIAGNOSIS — J441 Chronic obstructive pulmonary disease with (acute) exacerbation: Secondary | ICD-10-CM | POA: Insufficient documentation

## 2015-04-10 DIAGNOSIS — J418 Mixed simple and mucopurulent chronic bronchitis: Secondary | ICD-10-CM | POA: Diagnosis not present

## 2015-04-10 DIAGNOSIS — J449 Chronic obstructive pulmonary disease, unspecified: Secondary | ICD-10-CM | POA: Insufficient documentation

## 2015-04-10 MED ORDER — ALBUTEROL SULFATE HFA 108 (90 BASE) MCG/ACT IN AERS: 2.0000 | INHALATION_SPRAY | Freq: Four times a day (QID) | RESPIRATORY_TRACT | Status: DC | PRN

## 2015-04-10 NOTE — Patient Instructions (Signed)

## 2015-04-10 NOTE — Progress Notes (Signed)
Patient ID: Gabrielle Pineda, female   DOB: [birth year], 59 y.o.   MRN: [id number]    COPD, Follow up:  She was last seen for this 3 months ago. Changes made include adding spiriva inhaler.   She reports poor compliance with treatment.  She is not using spiriva as it caused her to have severe fatigue.  She also has not been using her albuterol inhaler, nor has she needed it. She is not having side effects.  she is/isnot using rescue inhaler. Typically 0 per days. She IS experiencing cough. She is NOT experiencing dyspnea, wheezing, fatigue, weight increase, weight loss, chills, fever, increased sputum or colored sputum. she report breathing is Improved.  Pulmonary Functions Testing Results:  No results found for: FEV1, FVC, FEV1FVC, TLC   REVIEW OF SYSTEMS: Negative for shortness of breath, wheezing, chest pain, palpitations, weight loss, fatigue, fever, chills, nausea, vomiting, headache, lightheadedness, syncope  PHYSICAL EXAM:  Right tympanic membrane is pearly gray with visual bony landmarks. Left tympanic membrane is pearly gray with a fluid level seen. No bulging or erythema. External ear canals are clear. Nasal mucosa is normal. Mucous membranes are moist and pink. Mouth\oropharynx is clear. Mucous membranes are moist and pink. Neck is supple. No thyromegaly or lymphadenopathy. Lungs were clear to auscultation bilaterally throughout all lung fields. No wheezes, rales or rhonchi heard. Heart regular rate and rhythm. No murmurs, gallops or rubs heard.  ASSESSMENT/PLAN: 1. Mixed simple and mucopurulent chronic bronchitis She has improved since May 2016. She is currently not using any inhalers or treatment for COPD. She does continue to use her Allegra and Mucinex daily. I did advise that I felt it would be best for her to keep albuterol inhaler on her in case she were to have an exacerbation. I refilled her albuterol as below. She is to call the office if she has any worsening  symptoms. If not I will see her back in 6 months to recheck. - albuterol (PROVENTIL HFA;VENTOLIN HFA) 108 (90 BASE) MCG/ACT inhaler; Inhale 2 puffs into the lungs every 6 (six) hours as needed for wheezing or shortness of breath.  Dispense: 1 Inhaler; Refill: 6  2. SMOKER We did discuss smoking cessation today in the office. She is not interested in quitting smoking but states she is trying to cut back on how many cigarettes she does smoke a day. She states she does smoke about one pack and a half a day. She is trying to cut back to one pack a day.  ------------------------------------------------------------------------

## 2015-04-10 NOTE — Progress Notes (Deleted)
Patient ID: Gabrielle Pineda, female   DOB: 08-28-1955, 59 y.o.   MRN: [id number] Patient: Gabrielle Pineda, Female    DOB: September 10, 1955, 59 y.o.   MRN: [id number] Visit Date: 04/10/2015  Today's Provider: [name], PA-C   Chief Complaint  Patient presents with  . Follow-up    COPD   Subjective:    Annual wellness visit Gabrielle Pineda is a 59 y.o. female who presents today for her Subsequent Annual Wellness Visit. She feels {DESC; WELL/FAIRLY WELL/POORLY:18703}. She reports exercising ***. She reports she is sleeping {DESC; WELL/FAIRLY WELL/POORLY:18703}.  -----------------------------------------------------------   Review of Systems  Social History   Social History  . Marital Status: Married    Spouse Name: N/A  . Number of Children: N/A  . Years of Education: N/A   Occupational History  . Not on file.   Social History Main Topics  . Smoking status: Current Every Day Smoker -- 0.50 packs/day for 40 years  . Smokeless tobacco: Not on file  . Alcohol Use: No     Comment: recovering alcoholic aince [id number]   . Drug Use: No  . Sexual Activity: Not on file   Other Topics Concern  . Not on file   Social History Narrative   Married; full time; does not get regular exercise.     Patient Active Problem List   Diagnosis Date Noted  . Family history of malignant neoplasm of pancreas 04/03/2015  . Hypercholesteremia 04/03/2015  . Disorder of iron metabolism 04/03/2015  . Premature menopause 04/03/2015  . Weight loss 04/03/2015  . Delayed onset of urination 04/03/2015  . Acid reflux 10/11/2014  . Closed fracture of distal phalanx of thumb 08/15/2014  . Arthritis, degenerative 01/30/2014  . Arthritis or polyarthritis, rheumatoid 01/30/2014  . Shortness of breath 12/22/2013  . Other and unspecified hyperlipidemia 09/22/2010  . SMOKER 09/22/2010  . Paroxysmal supraventricular tachycardia 09/22/2010  . CHEST PAIN UNSPECIFIED 09/22/2010  . ELECTROCARDIOGRAM,  ABNORMAL 09/22/2010  . B-complex deficiency 09/09/2007  . CN (constipation) 06/26/2007  . Clinical depression 06/26/2007  . Cold sore 06/26/2007  . H/O alcohol abuse 06/26/2007  . Cannot sleep 06/26/2007  . Localized osteoarthrosis, hand 06/26/2007  . Menopausal symptom 06/26/2007  . Fast heart beat 06/26/2007    Past Surgical History  Procedure Laterality Date  . Neck disc surgery      plates and screws in neck x 2  . Breast enhancement surgery  1981  . Esophagogastroduodenoscopy    . Tonsillectomy and adenoidectomy  1973   . Carpal tunnel release  11/11/2011    Procedure: CARPAL TUNNEL RELEASE;  Surgeon: Floyce Stakes, MD;  Location: [facility];  Service: Neurosurgery;  Laterality: Right;  Right Median Nerve Decompression  . Carpal tunnel release  2013    Her family history includes Alcohol abuse in her father and mother; Bipolar disorder in her mother; Pancreatic cancer in her father; Suicidality in her mother. There is no history of Anesthesia problems.    Previous Medications   B COMPLEX VITAMINS TABLET    Take 1 tablet by mouth daily.     FOLIC ACID (FOLVITE) 1 MG TABLET    Take 1 tablet by mouth daily.   GUAIFENESIN (MUCINEX) 600 MG 12 HR TABLET    Take 1,200 mg by mouth 2 (two) times daily.   IBUPROFEN (ADVIL,MOTRIN) 200 MG TABLET    Take 200 mg by mouth every 6 (six) hours as needed. For pain   MELOXICAM (MOBIC) 7.5 MG  TABLET    Take 7.5 mg by mouth daily.   METHOTREXATE (RHEUMATREX) 2.5 MG TABLET       MULTIPLE VITAMIN (MULTIVITAMIN) TABLET    Take 1 tablet by mouth daily.     NUTRITIONAL SUPPLEMENTS (ESTROVEN ENERGY PO)    Take by mouth daily.   OMEPRAZOLE (PRILOSEC) 20 MG CAPSULE    Take 20 mg by mouth daily.   PRAVASTATIN SODIUM PO    Take by mouth.   SERTRALINE (ZOLOFT) 100 MG TABLET    TAKE ONE TABLET BY MOUTH ONCE DAILY   TRAMADOL (ULTRAM) 50 MG TABLET       TRAZODONE (DESYREL) 50 MG TABLET    TAKE ONE TO TWO TABLETS BY MOUTH AT BEDTIME   VALACYCLOVIR  (VALTREX) 1000 MG TABLET    Take 2 tablets by mouth 2 (two) times daily. FOR 5 DAYS AS NEEDED   VENLAFAXINE XR (EFFEXOR-XR) 75 MG 24 HR CAPSULE    TAKE THREE CAPSULES BY MOUTH ONCE DAILY    Patient Care Team: Birdie Sons, MD as PCP - General (Family Medicine)     Objective:   Vitals: BP 116/70 mmHg  Pulse 83  Temp(Src) 97.9 F (36.6 C) (Oral)  Resp 16  Wt 140 lb 3.2 oz (63.594 kg)  SpO2 98%  Physical Exam  Activities of Daily Living No flowsheet data found.  Fall Risk Assessment No flowsheet data found.   Depression Screen No flowsheet data found.  Cognitive Testing - 6-CIT  Correct? Score   What year is it? {yes no:22349} {0-4:31231} 0 or 4  What month is it? {yes no:22349} {0-3:21082} 0 or 3  Memorize:    Pia Mau,  42,  [address]  [place],      What time is it? (within 1 hour) {yes no:22349} {0-3:21082} 0 or 3  Count backwards from 20 {yes no:22349} {0-4:31231} 0, 2, or 4  Name the months of the year {yes no:22349} {0-4:31231} 0, 2, or 4  Repeat name & address above {yes no:22349} {0-10:5044} 0, 2, 4, 6, 8, or 10       TOTAL SCORE  ***/28   Interpretation:  {normal/abnormal:11317::"Normal"}  Normal (0-7) Abnormal (8-28)       Assessment & Plan:     Annual Wellness Visit  Reviewed patient's Family Medical History Reviewed and updated list of patient's medical providers Assessment of cognitive impairment was done Assessed patient's functional ability Established a written schedule for health screening [place] Completed and Reviewed  Exercise Activities and Dietary recommendations Goals    None       There is no immunization history on file for this patient.  Health Maintenance  Topic Date Due  . Hepatitis C Screening  Apr 20, 1956  . HIV Screening  08/10/1971  . TETANUS/TDAP  08/10/1975  . MAMMOGRAM  08/09/2006  . COLONOSCOPY  08/09/2006  . INFLUENZA VACCINE  03/18/2015      Discussed health benefits of  physical activity, and encouraged her to engage in regular exercise appropriate for her age and condition.    ------------------------------------------------------------------------------------------------------------

## 2015-04-22 ENCOUNTER — Other Ambulatory Visit: Payer: Self-pay | Admitting: Family Medicine

## 2015-05-10 ENCOUNTER — Other Ambulatory Visit: Payer: Self-pay | Admitting: Family Medicine

## 2015-05-10 DIAGNOSIS — G4700 Insomnia, unspecified: Secondary | ICD-10-CM

## 2015-05-21 ENCOUNTER — Other Ambulatory Visit: Payer: Self-pay | Admitting: Family Medicine

## 2015-06-19 ENCOUNTER — Other Ambulatory Visit: Payer: Self-pay | Admitting: Family Medicine

## 2015-09-02 ENCOUNTER — Other Ambulatory Visit: Payer: Self-pay | Admitting: *Deleted

## 2015-09-02 MED ORDER — VALACYCLOVIR HCL 1 G PO TABS: 2000.0000 mg | ORAL_TABLET | Freq: Two times a day (BID) | ORAL | Status: DC

## 2015-09-17 ENCOUNTER — Other Ambulatory Visit: Payer: Self-pay | Admitting: Family Medicine

## 2015-10-11 ENCOUNTER — Ambulatory Visit (INDEPENDENT_AMBULATORY_CARE_PROVIDER_SITE_OTHER): Payer: BLUE CROSS/BLUE SHIELD | Admitting: Physician Assistant

## 2015-10-11 ENCOUNTER — Encounter: Payer: Self-pay | Admitting: Physician Assistant

## 2015-10-11 VITALS — BP 140/82 | HR 79 | Temp 98.0°F | Resp 16 | Wt 140.2 lb

## 2015-10-11 DIAGNOSIS — J418 Mixed simple and mucopurulent chronic bronchitis: Secondary | ICD-10-CM | POA: Diagnosis not present

## 2015-10-11 MED ORDER — ALBUTEROL SULFATE HFA 108 (90 BASE) MCG/ACT IN AERS: 2.0000 | INHALATION_SPRAY | Freq: Four times a day (QID) | RESPIRATORY_TRACT | Status: DC | PRN

## 2015-10-11 NOTE — Progress Notes (Signed)
Patient: Gabrielle Pineda Female    DOB: 1955/11/12   60 y.o.   MRN: [id number] Visit Date: 10/11/2015  Today's Provider: [name], PA-C   Chief Complaint  Patient presents with  . Follow-up    COPD   Subjective:    HPI COPD: Patient here for her 6 months follow-upCOPD. Symptoms cough productive of yellow sputum in small amounts does not worsen with exertion. Patient uses 2 pillows at night. Patient currently is not on home oxygen therapy.Marland Kitchen Respiratory history: COPD In the last office visit patient was not using any treatment or inhaler for her COPD, but was advised and given a refill on her Albuterol Inhaler in case she were to have an exacerbation. She had never had a spirometry per her memory.    Allergies  Allergen Reactions  . Plaquenil  [Hydroxychloroquine] Rash   Previous Medications   ALBUTEROL (PROVENTIL HFA;VENTOLIN HFA) 108 (90 BASE) MCG/ACT INHALER    Inhale 2 puffs into the lungs every 6 (six) hours as needed for wheezing or shortness of breath.   B COMPLEX VITAMINS TABLET    Take 1 tablet by mouth daily.     GUAIFENESIN (MUCINEX) 600 MG 12 HR TABLET    Take 1,200 mg by mouth 2 (two) times daily.   IBUPROFEN (ADVIL,MOTRIN) 200 MG TABLET    Take 200 mg by mouth every 6 (six) hours as needed. For pain   MELOXICAM (MOBIC) 7.5 MG TABLET    Take 7.5 mg by mouth daily.   METHOTREXATE (RHEUMATREX) 2.5 MG TABLET       MULTIPLE VITAMIN (MULTIVITAMIN) TABLET    Take 1 tablet by mouth daily.     NUTRITIONAL SUPPLEMENTS (ESTROVEN ENERGY PO)    Take by mouth daily.   OMEPRAZOLE (PRILOSEC) 20 MG CAPSULE    Take 20 mg by mouth daily.   PRAVASTATIN (PRAVACHOL) 40 MG TABLET    Take 1 tablet (40 mg total) by mouth daily.   SERTRALINE (ZOLOFT) 100 MG TABLET    TAKE ONE TABLET BY MOUTH ONCE DAILY   TRAMADOL (ULTRAM) 50 MG TABLET       TRAZODONE (DESYREL) 50 MG TABLET    TAKE ONE TO TWO TABLETS BY MOUTH AT BEDTIME   VALACYCLOVIR (VALTREX) 1000 MG TABLET    Take 2  tablets (2,000 mg total) by mouth 2 (two) times daily. FOR 5 DAYS AS NEEDED   VENLAFAXINE XR (EFFEXOR-XR) 75 MG 24 HR CAPSULE    TAKE THREE CAPSULES BY MOUTH ONCE DAILY    Review of Systems  Constitutional: Negative.   HENT: Positive for congestion. Negative for ear pain, nosebleeds, postnasal drip, rhinorrhea, sinus pressure, sneezing and sore throat.   Eyes: Negative.   Respiratory: Positive for cough. Negative for chest tightness, shortness of breath and wheezing.   Cardiovascular: Negative for chest pain, palpitations and leg swelling.  Gastrointestinal: Negative for nausea, vomiting and abdominal pain.    Social History  Substance Use Topics  . Smoking status: Current Every Day Smoker -- 0.50 packs/day for 40 years  . Smokeless tobacco: Not on file  . Alcohol Use: No     Comment: recovering alcoholic aince [id number]    Objective:   BP 140/82 mmHg  Pulse 79  Temp(Src) 98 F (36.7 C) (Oral)  Resp 16  Wt 140 lb 3.2 oz (63.594 kg)  SpO2 97%  Physical Exam  Constitutional: She appears well-developed and well-nourished. No distress.  Neck: Normal range of motion. Neck  supple. No JVD present. No tracheal deviation present. No thyromegaly present.  Cardiovascular: Normal rate, regular rhythm and normal heart sounds.  Exam reveals no gallop and no friction rub.   No murmur heard. Pulmonary/Chest: Effort normal and breath sounds normal. No respiratory distress. She has no wheezes. She has no rales.  Lymphadenopathy:    She has no cervical adenopathy.  Skin: She is not diaphoretic.  Vitals reviewed.       Assessment & Plan:     1. Mixed simple and mucopurulent chronic bronchitis (HCC) Spirometry showed mild restriction with FVC 81%, FEV1 71% and FVC/FEV1 ratio of 90%. Since she is stable and only using her rescue inhaler once weekly to every other week will continue current medical treatment plan.  Albuterol inhaler refilled as below. Advised to start allergy medications to prevent  infection. Discussed smoking cessation but she does not wish to quit at this time. Advised her to call if she develops any URI symptoms or exacerbations that cause her to significantly use her albuterol inhaler more. Also if no infection but increase use is noted she is to call.  At that time will add daily inhaler. I will see her back in 6 months for recheck as well as for her CPE.  - Spirometry with graph - albuterol (PROVENTIL HFA;VENTOLIN HFA) 108 (90 Base) MCG/ACT inhaler; Inhale 2 puffs into the lungs every 6 (six) hours as needed for wheezing or shortness of breath.  Dispense: 1 Inhaler; Refill: [place], PA-C  [facility]

## 2015-10-11 NOTE — Patient Instructions (Signed)
Chronic Obstructive Pulmonary Disease Chronic obstructive pulmonary disease (COPD) is a common lung condition in which airflow from the lungs is limited. COPD is a general term that can be used to describe many different lung problems that limit airflow, including both chronic bronchitis and emphysema. If you have COPD, your lung function will probably never return to normal, but there are measures you can take to improve lung function and make yourself feel better. CAUSES   Smoking (common).  Exposure to secondhand smoke.  Genetic problems.  Chronic inflammatory lung diseases or recurrent infections. SYMPTOMS  Shortness of breath, especially with physical activity.  Deep, persistent (chronic) cough with a large amount of thick mucus.  Wheezing.  Rapid breaths (tachypnea).  Gray or bluish discoloration (cyanosis) of the skin, especially in your fingers, toes, or lips.  Fatigue.  Weight loss.  Frequent infections or episodes when breathing symptoms become much worse (exacerbations).  Chest tightness. DIAGNOSIS Your health care provider will take a medical history and perform a physical examination to diagnose COPD. Additional tests for COPD may include:  Lung (pulmonary) function tests.  Chest X-ray.  CT scan.  Blood tests. TREATMENT  Treatment for COPD may include:  Inhaler and nebulizer medicines. These help manage the symptoms of COPD and make your breathing more comfortable.  Supplemental oxygen. Supplemental oxygen is only helpful if you have a low oxygen level in your blood.  Exercise and physical activity. These are beneficial for nearly all people with COPD.  Lung surgery or transplant.  Nutrition therapy to gain weight, if you are underweight.  Pulmonary rehabilitation. This may involve working with a team of health care providers and specialists, such as respiratory, occupational, and physical therapists. HOME CARE INSTRUCTIONS  Take all medicines  (inhaled or pills) as directed by your health care provider.  Avoid over-the-counter medicines or cough syrups that dry up your airway (such as antihistamines) and slow down the elimination of secretions unless instructed otherwise by your health care provider.  If you are a smoker, the most important thing that you can do is stop smoking. Continuing to smoke will cause further lung damage and breathing trouble. Ask your health care provider for help with quitting smoking. He or she can direct you to community resources or hospitals that provide support.  Avoid exposure to irritants such as smoke, chemicals, and fumes that aggravate your breathing.  Use oxygen therapy and pulmonary rehabilitation if directed by your health care provider. If you require home oxygen therapy, ask your health care provider whether you should purchase a pulse oximeter to measure your oxygen level at home.  Avoid contact with individuals who have a contagious illness.  Avoid extreme temperature and humidity changes.  Eat healthy foods. Eating smaller, more frequent meals and resting before meals may help you maintain your strength.  Stay active, but balance activity with periods of rest. Exercise and physical activity will help you maintain your ability to do things you want to do.  Preventing infection and hospitalization is very important when you have COPD. Make sure to receive all the vaccines your health care provider recommends, especially the pneumococcal and influenza vaccines. Ask your health care provider whether you need a pneumonia vaccine.  Learn and use relaxation techniques to manage stress.  Learn and use controlled breathing techniques as directed by your health care provider. Controlled breathing techniques include:  Pursed lip breathing. Start by breathing in (inhaling) through your nose for 1 second. Then, purse your lips as if you were   going to whistle and breathe out (exhale) through the  pursed lips for 2 seconds.  Diaphragmatic breathing. Start by putting one hand on your abdomen just above your waist. Inhale slowly through your nose. The hand on your abdomen should move out. Then purse your lips and exhale slowly. You should be able to feel the hand on your abdomen moving in as you exhale.  Learn and use controlled coughing to clear mucus from your lungs. Controlled coughing is a series of short, progressive coughs. The steps of controlled coughing are: 1. Lean your head slightly forward. 2. Breathe in deeply using diaphragmatic breathing. 3. Try to hold your breath for 3 seconds. 4. Keep your mouth slightly open while coughing twice. 5. Spit any mucus out into a tissue. 6. Rest and repeat the steps once or twice as needed. SEEK MEDICAL CARE IF:  You are coughing up more mucus than usual.  There is a change in the color or thickness of your mucus.  Your breathing is more labored than usual.  Your breathing is faster than usual. SEEK IMMEDIATE MEDICAL CARE IF:  You have shortness of breath while you are resting.  You have shortness of breath that prevents you from:  Being able to talk.  Performing your usual physical activities.  You have chest pain lasting longer than 5 minutes.  Your skin color is more cyanotic than usual.  You measure low oxygen saturations for longer than 5 minutes with a pulse oximeter. MAKE SURE YOU:  Understand these instructions.  Will watch your condition.  Will get help right away if you are not doing well or get worse.   This information is not intended to replace advice given to you by your health care provider. Make sure you discuss any questions you have with your health care provider.   Document Released: 05/13/2005 Document Revised: 08/24/2014 Document Reviewed: 03/30/2013 Elsevier Interactive Patient Education 2016 [employer]  

## 2016-01-24 ENCOUNTER — Other Ambulatory Visit: Payer: Self-pay | Admitting: *Deleted

## 2016-01-24 MED ORDER — VALACYCLOVIR HCL 1 G PO TABS: 2000.0000 mg | ORAL_TABLET | Freq: Two times a day (BID) | ORAL | Status: DC

## 2016-02-19 ENCOUNTER — Other Ambulatory Visit: Payer: Self-pay | Admitting: *Deleted

## 2016-02-19 MED ORDER — VENLAFAXINE HCL ER 75 MG PO CP24: 225.0000 mg | ORAL_CAPSULE | Freq: Every day | ORAL | Status: DC

## 2016-02-19 NOTE — Telephone Encounter (Signed)
Patient has ov appt with Fenton Malling 04/08/2016.

## 2016-03-04 ENCOUNTER — Other Ambulatory Visit: Payer: Self-pay | Admitting: Neurosurgery

## 2016-03-04 DIAGNOSIS — M5414 Radiculopathy, thoracic region: Secondary | ICD-10-CM

## 2016-03-10 ENCOUNTER — Ambulatory Visit
Admission: RE | Admit: 2016-03-10 | Discharge: 2016-03-10 | Disposition: A | Discharge status: Home / Self Care | Payer: BLUE CROSS/BLUE SHIELD | Source: Ambulatory Visit | Attending: Neurosurgery | Admitting: Neurosurgery

## 2016-03-10 DIAGNOSIS — M5414 Radiculopathy, thoracic region: Secondary | ICD-10-CM

## 2016-03-10 MED ORDER — OXYCODONE-ACETAMINOPHEN 5-325 MG PO TABS: 1.0000 | ORAL_TABLET | Freq: Once | ORAL | Status: DC

## 2016-03-10 MED ORDER — DIAZEPAM 5 MG PO TABS
10.0000 mg | ORAL_TABLET | Freq: Once | ORAL | Status: AC
  Administered 2016-03-10: 5 mg via ORAL

## 2016-03-10 MED ORDER — IOPAMIDOL (ISOVUE-M 300) INJECTION 61%
10.0000 mL | Freq: Once | INTRAMUSCULAR | Status: AC | PRN
  Administered 2016-03-10: 10 mL via INTRATHECAL

## 2016-03-10 MED ORDER — MEPERIDINE HCL 100 MG/ML IJ SOLN
75.0000 mg | Freq: Once | INTRAMUSCULAR | Status: AC
  Administered 2016-03-10: 75 mg via INTRAMUSCULAR

## 2016-03-10 MED ORDER — ONDANSETRON HCL 4 MG/2ML IJ SOLN
4.0000 mg | Freq: Once | INTRAMUSCULAR | Status: AC
  Administered 2016-03-10: 4 mg via INTRAMUSCULAR

## 2016-03-10 NOTE — Progress Notes (Signed)
Pt states she has been off Zoloft, Ultram, Deseryl, and Effexor for the past 2 days.

## 2016-03-10 NOTE — Discharge Instructions (Signed)
Myelogram Discharge Instructions  1. Go home and rest quietly for the next 24 hours.  It is important to lie flat for the next 24 hours.  Get up only to go to the restroom.  You may lie in the bed or on a couch on your back, your stomach, your left side or your right side.  You may have one pillow under your head.  You may have pillows between your knees while you are on your side or under your knees while you are on your back.  2. DO NOT drive today.  Recline the seat as far back as it will go, while still wearing your seat belt, on the way home.  3. You may get up to go to the bathroom as needed.  You may sit up for 10 minutes to eat.  You may resume your normal diet and medications unless otherwise indicated.  Drink lots of extra fluids today and tomorrow.  4. The incidence of headache, nausea, or vomiting is about 5% (one in 20 patients).  If you develop a headache, lie flat and drink plenty of fluids until the headache goes away.  Caffeinated beverages may be helpful.  If you develop severe nausea and vomiting or a headache that does not go away with flat bed rest, call [phone].  5. You may resume normal activities after your 24 hours of bed rest is over; however, do not exert yourself strongly or do any heavy lifting tomorrow. If when you get up you have a headache when standing, go back to bed and force fluids for another 24 hours.  6. Call your physician for a follow-up appointment.  The results of your myelogram will be sent directly to your physician by the following day.  7. If you have any questions or if complications develop after you arrive home, please call [phone].  Discharge instructions have been explained to the patient.  The patient, or the person responsible for the patient, fully understands these instructions.      May resume Zoloft, Ultram, Deseryl  and Effexor on March 11, 2016, after 1:00 pm

## 2016-04-08 ENCOUNTER — Encounter: Payer: Self-pay | Admitting: Physician Assistant

## 2016-04-08 ENCOUNTER — Ambulatory Visit (INDEPENDENT_AMBULATORY_CARE_PROVIDER_SITE_OTHER): Payer: BLUE CROSS/BLUE SHIELD | Admitting: Physician Assistant

## 2016-04-08 VITALS — BP 122/78 | HR 80 | Temp 98.1°F | Ht 67.5 in | Wt 137.2 lb

## 2016-04-08 DIAGNOSIS — E7800 Pure hypercholesterolemia, unspecified: Secondary | ICD-10-CM | POA: Diagnosis not present

## 2016-04-08 DIAGNOSIS — Z1159 Encounter for screening for other viral diseases: Secondary | ICD-10-CM | POA: Diagnosis not present

## 2016-04-08 DIAGNOSIS — Z833 Family history of diabetes mellitus: Secondary | ICD-10-CM

## 2016-04-08 DIAGNOSIS — Z0000 Encounter for general adult medical examination without abnormal findings: Secondary | ICD-10-CM

## 2016-04-08 DIAGNOSIS — Z124 Encounter for screening for malignant neoplasm of cervix: Secondary | ICD-10-CM

## 2016-04-08 DIAGNOSIS — Z1239 Encounter for other screening for malignant neoplasm of breast: Secondary | ICD-10-CM | POA: Diagnosis not present

## 2016-04-08 NOTE — Progress Notes (Addendum)
Patient: Gabrielle Pineda, Female    DOB: 1956/03/12, 60 y.o.   MRN: [id number] Visit Date: 04/08/2016  Today's Provider: [name], PA-C   Chief Complaint  Patient presents with  . Annual Exam   Subjective:    Annual physical exam Gabrielle Pineda is a 60 y.o. female who presents today for health maintenance and complete physical. She feels fairly well. She reports exercising none. She reports she is sleeping well (average 7-8 hours per night).  All review of systems that are positive are chronic. She did just recently undergo another surgery for her cervical spine approximately 2 weeks ago. She does follow-up with her surgeon next week. -----------------------------------------------------------------  Colonoscopy: 09/2015- high grade dysplasia polyp site; repeat in 3 years  Review of Systems  Constitutional: Positive for fatigue.  HENT: Positive for congestion, rhinorrhea, sinus pressure and trouble swallowing.   Eyes: Negative.   Respiratory: Positive for shortness of breath and wheezing.   Cardiovascular: Negative.   Gastrointestinal: Positive for constipation.  Endocrine: Positive for polydipsia.  Genitourinary: Negative.   Musculoskeletal: Positive for arthralgias, back pain, joint swelling, myalgias, neck pain and neck stiffness.  Allergic/Immunologic: Positive for environmental allergies.  Neurological: Positive for weakness, numbness and headaches.  Hematological: Negative.   Psychiatric/Behavioral: Positive for sleep disturbance. The patient is nervous/anxious.     Social History      She  reports that she has been smoking.  She has a 20.00 pack-year smoking history. She has never used smokeless tobacco. She reports that she does not drink alcohol or use drugs.       Social History   Social History  . Marital status: Married    Spouse name: N/A  . Number of children: N/A  . Years of education: N/A   Social History Main Topics  . Smoking  status: Current Every Day Smoker    Packs/day: 0.50    Years: 40.00  . Smokeless tobacco: Never Used  . Alcohol use No     Comment: recovering alcoholic aince [id number]   . Drug use: No  . Sexual activity: No   Other Topics Concern  . None   Social History Narrative   Married; full time; does not get regular exercise.     Past Medical History:  Diagnosis Date  . Anemia   . Cervicalgia   . Depression   . GERD (gastroesophageal reflux disease)   . Heart murmur    on heard when pt is lying  . Other and unspecified hyperlipidemia   . Tachycardia    d/t questionable anxiety happens every 5- 10 years, sees [facility]     Patient Active Problem List   Diagnosis Date Noted  . COPD (chronic obstructive pulmonary disease) ([place]) 04/10/2015  . Family history of malignant neoplasm of pancreas 04/03/2015  . Hypercholesteremia 04/03/2015  . Disorder of iron metabolism 04/03/2015  . Premature menopause 04/03/2015  . Weight loss 04/03/2015  . Delayed onset of urination 04/03/2015  . Acid reflux 10/11/2014  . Closed fracture of distal phalanx of thumb 08/15/2014  . Arthritis, degenerative 01/30/2014  . Arthritis or polyarthritis, rheumatoid ([place]) 01/30/2014  . Shortness of breath 12/22/2013  . Other and unspecified hyperlipidemia 09/22/2010  . SMOKER 09/22/2010  . Paroxysmal supraventricular tachycardia ([place]) 09/22/2010  . CHEST PAIN UNSPECIFIED 09/22/2010  . ELECTROCARDIOGRAM, ABNORMAL 09/22/2010  . B-complex deficiency 09/09/2007  . CN (constipation) 06/26/2007  . Clinical depression 06/26/2007  . Cold sore 06/26/2007  . H/O  alcohol abuse 06/26/2007  . Cannot sleep 06/26/2007  . Localized osteoarthrosis, hand 06/26/2007  . Menopausal symptom 06/26/2007  . Fast heart beat 06/26/2007    Past Surgical History:  Procedure Laterality Date  . BREAST ENHANCEMENT SURGERY  1981  . CARPAL TUNNEL RELEASE  11/11/2011   Procedure: CARPAL TUNNEL RELEASE;  Surgeon: Floyce Stakes, MD;  Location: [facility];  Service: Neurosurgery;  Laterality: Right;  Right Median Nerve Decompression  . CARPAL TUNNEL RELEASE  2013  . ESOPHAGOGASTRODUODENOSCOPY    . neck disc surgery     plates and screws in neck x 2  . TONSILLECTOMY AND ADENOIDECTOMY  1973     Family History        Family Status  Relation Status  . Father Deceased at age 52  . Mother Deceased at age 24   suicide, drug OD   . Neg Hx         Her family history includes Alcohol abuse in her father and mother; Bipolar disorder in her mother; Pancreatic cancer in her father; Suicidality in her mother.    Allergies  Allergen Reactions  . Plaquenil  [Hydroxychloroquine] Rash    Current Meds  Medication Sig  . diazepam (VALIUM) 5 MG tablet     Patient Care Team: Birdie Sons, MD as PCP - General (Family Medicine)     Objective:   Vitals: BP 122/78 (BP Location: Right Arm, Patient Position: Sitting, Cuff Size: Normal)   Pulse 80   Temp 98.1 F (36.7 C) (Oral)   Ht 5' 7.5" (1.715 m)   Wt 137 lb 3.2 oz (62.2 kg)   BMI 21.17 kg/m    Physical Exam  Constitutional: She is oriented to person, place, and time. She appears well-developed and well-nourished. No distress.  HENT:  Head: Normocephalic and atraumatic.  Right Ear: Hearing, tympanic membrane, external ear and ear canal normal.  Left Ear: Hearing, tympanic membrane, external ear and ear canal normal.  Nose: Nose normal.  Mouth/Throat: Uvula is midline, oropharynx is clear and moist and mucous membranes are normal. No oropharyngeal exudate.  Eyes: Conjunctivae and EOM are normal. Pupils are equal, round, and reactive to light. Right eye exhibits no discharge. Left eye exhibits no discharge. No scleral icterus.  Neck: Neck supple. No JVD present. No spinous process tenderness and no muscular tenderness present. Carotid bruit is not present. Decreased range of motion present. No tracheal deviation present. No thyromegaly present.     Cardiovascular: Normal rate, regular rhythm, normal heart sounds and intact distal pulses.  Exam reveals no gallop and no friction rub.   No murmur heard. Pulmonary/Chest: Effort normal and breath sounds normal. No respiratory distress. She has no wheezes. She has no rales. She exhibits no tenderness. Right breast exhibits no inverted nipple, no mass, no nipple discharge, no skin change and no tenderness. Left breast exhibits no inverted nipple, no mass, no nipple discharge, no skin change and no tenderness. Breasts are symmetrical.  Abdominal: Soft. Bowel sounds are normal. She exhibits no distension and no mass. There is no tenderness. There is no rebound and no guarding. Hernia confirmed negative in the right inguinal area and confirmed negative in the left inguinal area.  Genitourinary: Rectum normal, vagina normal and uterus normal. No breast swelling, tenderness, discharge or bleeding. Pelvic exam was performed with patient supine. There is no rash, tenderness, lesion or injury on the right labia. There is no rash, tenderness, lesion or injury on the left labia. Cervix  exhibits no motion tenderness, no discharge and no friability. Right adnexum displays no mass, no tenderness and no fullness. Left adnexum displays no mass, no tenderness and no fullness. No erythema, tenderness or bleeding in the vagina. No signs of injury around the vagina. No vaginal discharge found.  Musculoskeletal: She exhibits no edema or tenderness.  Lymphadenopathy:    She has no cervical adenopathy.       Right: No inguinal adenopathy present.       Left: No inguinal adenopathy present.  Neurological: She is alert and oriented to person, place, and time. She has normal reflexes. No cranial nerve deficit. Coordination normal.  Skin: Skin is warm and dry. No rash noted. She is not diaphoretic.  Psychiatric: She has a normal mood and affect. Her behavior is normal. Judgment and thought content normal.  Vitals  reviewed.  Depression Screen No flowsheet data found.  Assessment & Plan:     Routine Health Maintenance and Physical Exam  Exercise Activities and Dietary recommendations Goals    None      Immunization History  Administered Date(s) Administered  . Influenza,inj,Quad PF,36+ Mos 11/18/2015  . Influenza-Unspecified 09/24/2014  . Pneumococcal Polysaccharide-23 09/24/2014  . Tdap 11/15/2015    Health Maintenance  Topic Date Due  . Hepatitis C Screening  [birth year]1955  . HIV Screening  08/10/1971  . PAP SMEAR  08/09/1977  . MAMMOGRAM  08/09/2006  . COLONOSCOPY  08/09/2006  . INFLUENZA VACCINE  03/17/2016  . TETANUS/TDAP  11/14/2025      Discussed health benefits of physical activity, and encouraged her to engage in regular exercise appropriate for her age and condition.   1. Annual physical exam Normal physical exam today with exception of surgical scar and stiffness from RA. Will check labs as below and f/u pending lab results. If labs are stable and WNL she will not need to have these rechecked for one year at her next annual physical exam. She is to call the office in the meantime if she has any acute issue, questions or concerns. - CBC with Differential - Comprehensive metabolic panel - TSH  2. Breast cancer screening Breast exam today was normal. There is no family history of breast cancer. She does perform regular self breast exams. Mammogram was ordered as below. Information for [facility] was given to patient so she may schedule her mammogram at her convenience. - Mammogram Digital Screening; Future  3. Cervical cancer screening Pap collected today. Will send as below and f/u pending results. - Pap IG and HPV (high risk) DNA detection ([facility])  4. Hypercholesterolemia Will check labs as below and f/u pending results. - Lipid panel  5. Family history of diabetes mellitus (DM) Will check labs as below and f/u pending results. - Hemoglobin  A1c  6. Need for hepatitis C screening test - Hepatitis C antibody screen  --------------------------------------------------------------------    [name], PA-C  [facility]

## 2016-04-08 NOTE — Patient Instructions (Signed)
Health Maintenance, Female Adopting a healthy lifestyle and getting preventive care can go a long way to promote health and wellness. Talk with your health care provider about what schedule of regular examinations is right for you. This is a good chance for you to check in with your provider about disease prevention and staying healthy. In between checkups, there are plenty of things you can do on your own. Experts have done a lot of research about which lifestyle changes and preventive measures are most likely to keep you healthy. Ask your health care provider for more information. WEIGHT AND DIET  Eat a healthy diet  Be sure to include plenty of vegetables, fruits, low-fat dairy products, and lean protein.  Do not eat a lot of foods high in solid fats, added sugars, or salt.  Get regular exercise. This is one of the most important things you can do for your health.  Most adults should exercise for at least 150 minutes each week. The exercise should increase your heart rate and make you sweat (moderate-intensity exercise).  Most adults should also do strengthening exercises at least twice a week. This is in addition to the moderate-intensity exercise.  Maintain a healthy weight  Body mass index (BMI) is a measurement that can be used to identify possible weight problems. It estimates body fat based on height and weight. Your health care provider can help determine your BMI and help you achieve or maintain a healthy weight.  For females 60 years of age and older:   A BMI below 18.5 is considered underweight.  A BMI of 18.5 to 24.9 is normal.  A BMI of 25 to 29.9 is considered overweight.  A BMI of 30 and above is considered obese.  Watch levels of cholesterol and blood lipids  You should start having your blood tested for lipids and cholesterol at 60 years of age, then have this test every 5 years.  You may need to have your cholesterol levels checked more often if:  Your lipid  or cholesterol levels are high.  You are older than 60 years of age.  You are at high risk for heart disease.  CANCER SCREENING   Lung Cancer  Lung cancer screening is recommended for adults 60-66 years old who are at high risk for lung cancer because of a history of smoking.  A yearly low-dose CT scan of the lungs is recommended for people who:  Currently smoke.  Have quit within the past 15 years.  Have at least a 30-pack-year history of smoking. A pack year is smoking an average of one pack of cigarettes a day for 1 year.  Yearly screening should continue until it has been 15 years since you quit.  Yearly screening should stop if you develop a health problem that would prevent you from having lung cancer treatment.  Breast Cancer  Practice breast self-awareness. This means understanding how your breasts normally appear and feel.  It also means doing regular breast self-exams. Let your health care provider know about any changes, no matter how small.  If you are in your 60s or 30s, you should have a clinical breast exam (CBE) by a health care provider every 1-3 years as part of a regular health exam.  If you are 60 or older, have a CBE every year. Also consider having a breast X-ray (mammogram) every year.  If you have a family history of breast cancer, talk to your health care provider about genetic screening.  If you  are at high risk for breast cancer, talk to your health care provider about having an MRI and a mammogram every year.  Breast cancer gene (BRCA) assessment is recommended for women who have family members with BRCA-related cancers. BRCA-related cancers include:  Breast.  Ovarian.  Tubal.  Peritoneal cancers.  Results of the assessment will determine the need for genetic counseling and BRCA1 and BRCA2 testing. Cervical Cancer Your health care provider may recommend that you be screened regularly for cancer of the pelvic organs (ovaries, uterus, and  vagina). This screening involves a pelvic examination, including checking for microscopic changes to the surface of your cervix (Pap test). You may be encouraged to have this screening done every 3 years, beginning at age 21.  For women ages 30-65, health care providers may recommend pelvic exams and Pap testing every 3 years, or they may recommend the Pap and pelvic exam, combined with testing for human papilloma virus (HPV), every 5 years. Some types of HPV increase your risk of cervical cancer. Testing for HPV may also be done on women of any age with unclear Pap test results.  Other health care providers may not recommend any screening for nonpregnant women who are considered low risk for pelvic cancer and who do not have symptoms. Ask your health care provider if a screening pelvic exam is right for you.  If you have had past treatment for cervical cancer or a condition that could lead to cancer, you need Pap tests and screening for cancer for at least 20 years after your treatment. If Pap tests have been discontinued, your risk factors (such as having a new sexual partner) need to be reassessed to determine if screening should resume. Some women have medical problems that increase the chance of getting cervical cancer. In these cases, your health care provider may recommend more frequent screening and Pap tests. Colorectal Cancer  This type of cancer can be detected and often prevented.  Routine colorectal cancer screening usually begins at 60 years of age and continues through 60 years of age.  Your health care provider may recommend screening at an earlier age if you have risk factors for colon cancer.  Your health care provider may also recommend using home test kits to check for hidden blood in the stool.  A small camera at the end of a tube can be used to examine your colon directly (sigmoidoscopy or colonoscopy). This is done to check for the earliest forms of colorectal  cancer.  Routine screening usually begins at age 50.  Direct examination of the colon should be repeated every 5-10 years through 60 years of age. However, you may need to be screened more often if early forms of precancerous polyps or small growths are found. Skin Cancer  Check your skin from head to toe regularly.  Tell your health care provider about any new moles or changes in moles, especially if there is a change in a mole's shape or color.  Also tell your health care provider if you have a mole that is larger than the size of a pencil eraser.  Always use sunscreen. Apply sunscreen liberally and repeatedly throughout the day.  Protect yourself by wearing long sleeves, pants, a wide-brimmed hat, and sunglasses whenever you are outside. HEART DISEASE, DIABETES, AND HIGH BLOOD PRESSURE   High blood pressure causes heart disease and increases the risk of stroke. High blood pressure is more likely to develop in:  People who have blood pressure in the high end   of the normal range (130-139/85-89 mm Hg).  People who are overweight or obese.  People who are African American.  If you are 38-23 years of age, have your blood pressure checked every 3-5 years. If you are 61 years of age or older, have your blood pressure checked every year. You should have your blood pressure measured twice--once when you are at a hospital or clinic, and once when you are not at a hospital or clinic. Record the average of the two measurements. To check your blood pressure when you are not at a hospital or clinic, you can use:  An automated blood pressure machine at a pharmacy.  A home blood pressure monitor.  If you are between 45 years and 39 years old, ask your health care provider if you should take aspirin to prevent strokes.  Have regular diabetes screenings. This involves taking a blood sample to check your fasting blood sugar level.  If you are at a normal weight and have a low risk for diabetes,  have this test once every three years after 60 years of age.  If you are overweight and have a high risk for diabetes, consider being tested at a younger age or more often. PREVENTING INFECTION  Hepatitis B  If you have a higher risk for hepatitis B, you should be screened for this virus. You are considered at high risk for hepatitis B if:  You were born in a country where hepatitis B is common. Ask your health care provider which countries are considered high risk.  Your parents were born in a high-risk country, and you have not been immunized against hepatitis B (hepatitis B vaccine).  You have HIV or AIDS.  You use needles to inject street drugs.  You live with someone who has hepatitis B.  You have had sex with someone who has hepatitis B.  You get hemodialysis treatment.  You take certain medicines for conditions, including cancer, organ transplantation, and autoimmune conditions. Hepatitis C  Blood testing is recommended for:  Everyone born from [birth year] through 1965.  Anyone with known risk factors for hepatitis C. Sexually transmitted infections (STIs)  You should be screened for sexually transmitted infections (STIs) including gonorrhea and chlamydia if:  You are sexually active and are younger than 60 years of age.  You are older than 60 years of age and your health care provider tells you that you are at risk for this type of infection.  Your sexual activity has changed since you were last screened and you are at an increased risk for chlamydia or gonorrhea. Ask your health care provider if you are at risk.  If you do not have HIV, but are at risk, it may be recommended that you take a prescription medicine daily to prevent HIV infection. This is called pre-exposure prophylaxis (PrEP). You are considered at risk if:  You are sexually active and do not regularly use condoms or know the HIV status of your partner(s).  You take drugs by injection.  You are sexually  active with a partner who has HIV. Talk with your health care provider about whether you are at high risk of being infected with HIV. If you choose to begin PrEP, you should first be tested for HIV. You should then be tested every 3 months for as long as you are taking PrEP.  PREGNANCY   If you are premenopausal and you may become pregnant, ask your health care provider about preconception counseling.  If you may  become pregnant, take 400 to 800 micrograms (mcg) of folic acid every day.  If you want to prevent pregnancy, talk to your health care provider about birth control (contraception). OSTEOPOROSIS AND MENOPAUSE   Osteoporosis is a disease in which the bones lose minerals and strength with aging. This can result in serious bone fractures. Your risk for osteoporosis can be identified using a bone density scan.  If you are 50 years of age or older, or if you are at risk for osteoporosis and fractures, ask your health care provider if you should be screened.  Ask your health care provider whether you should take a calcium or vitamin D supplement to lower your risk for osteoporosis.  Menopause may have certain physical symptoms and risks.  Hormone replacement therapy may reduce some of these symptoms and risks. Talk to your health care provider about whether hormone replacement therapy is right for you.  HOME CARE INSTRUCTIONS   Schedule regular health, dental, and eye exams.  Stay current with your immunizations.   Do not use any tobacco products including cigarettes, chewing tobacco, or electronic cigarettes.  If you are pregnant, do not drink alcohol.  If you are breastfeeding, limit how much and how often you drink alcohol.  Limit alcohol intake to no more than 1 drink per day for nonpregnant women. One drink equals 12 ounces of beer, 5 ounces of wine, or 1 ounces of hard liquor.  Do not use street drugs.  Do not share needles.  Ask your health care provider for help if  you need support or information about quitting drugs.  Tell your health care provider if you often feel depressed.  Tell your health care provider if you have ever been abused or do not feel safe at home.   This information is not intended to replace advice given to you by your health care provider. Make sure you discuss any questions you have with your health care provider.   Document Released: 02/16/2011 Document Revised: 08/24/2014 Document Reviewed: 07/05/2013 Elsevier Interactive Patient Education [employer].

## 2016-04-09 ENCOUNTER — Telehealth: Payer: Self-pay

## 2016-04-09 LAB — CBC WITH DIFFERENTIAL/PLATELET
BASOS ABS: 0 10*3/uL (ref 0.0–0.2)
Basos: 1 %
EOS (ABSOLUTE): 0.1 10*3/uL (ref 0.0–0.4)
Eos: 2 %
Hematocrit: 33.4 % — ABNORMAL LOW (ref 34.0–46.6)
Hemoglobin: 10.9 g/dL — ABNORMAL LOW (ref 11.1–15.9)
IMMATURE GRANULOCYTES: 0 %
Immature Grans (Abs): 0 10*3/uL (ref 0.0–0.1)
LYMPHS ABS: 1.8 10*3/uL (ref 0.7–3.1)
Lymphs: 25 %
MCH: 35 pg — ABNORMAL HIGH (ref 26.6–33.0)
MCHC: 32.6 g/dL (ref 31.5–35.7)
MCV: 107 fL — ABNORMAL HIGH (ref 79–97)
MONOS ABS: 0.6 10*3/uL (ref 0.1–0.9)
Monocytes: 9 %
NEUTROS PCT: 63 %
Neutrophils Absolute: 4.6 10*3/uL (ref 1.4–7.0)
PLATELETS: 372 10*3/uL (ref 150–379)
RBC: 3.11 x10E6/uL — AB (ref 3.77–5.28)
RDW: 15.3 % (ref 12.3–15.4)
WBC: 7.3 10*3/uL (ref 3.4–10.8)

## 2016-04-09 LAB — TSH: TSH: 2.13 u[IU]/mL (ref 0.450–4.500)

## 2016-04-09 LAB — COMPREHENSIVE METABOLIC PANEL
ALK PHOS: 98 IU/L (ref 39–117)
ALT: 22 IU/L (ref 0–32)
AST: 20 IU/L (ref 0–40)
Albumin/Globulin Ratio: 1.8 (ref 1.2–2.2)
Albumin: 4.2 g/dL (ref 3.5–5.5)
BUN / CREAT RATIO: 7 — AB (ref 9–23)
BUN: 5 mg/dL — AB (ref 6–24)
Bilirubin Total: <0.2 mg/dL (ref 0.0–1.2)
CALCIUM: 9.9 mg/dL (ref 8.7–10.2)
CO2: 24 mmol/L (ref 18–29)
CREATININE: 0.69 mg/dL (ref 0.57–1.00)
Chloride: 101 mmol/L (ref 96–106)
GFR calc Af Amer: 110 mL/min/{1.73_m2} (ref >59)
GFR, EST NON AFRICAN AMERICAN: 96 mL/min/{1.73_m2} (ref >59)
GLOBULIN, TOTAL: 2.3 g/dL (ref 1.5–4.5)
GLUCOSE: 73 mg/dL (ref 65–99)
Potassium: 3.9 mmol/L (ref 3.5–5.2)
SODIUM: 142 mmol/L (ref 134–144)
Total Protein: 6.5 g/dL (ref 6.0–8.5)

## 2016-04-09 LAB — LIPID PANEL
CHOL/HDL RATIO: 5 ratio — AB (ref 0.0–4.4)
Cholesterol, Total: 248 mg/dL — ABNORMAL HIGH (ref 100–199)
HDL: 50 mg/dL (ref >39)
LDL CALC: 160 mg/dL — AB (ref 0–99)
TRIGLYCERIDES: 188 mg/dL — AB (ref 0–149)
VLDL Cholesterol Cal: 38 mg/dL (ref 5–40)

## 2016-04-09 LAB — HEPATITIS C ANTIBODY: Hep C Virus Ab: <0.1 s/co ratio (ref 0.0–0.9)

## 2016-04-09 LAB — HEMOGLOBIN A1C
Est. average glucose Bld gHb Est-mCnc: 100 mg/dL
HEMOGLOBIN A1C: 5.1 % (ref 4.8–5.6)

## 2016-04-09 NOTE — Telephone Encounter (Signed)
-----   Message from [name], [place] sent at 04/09/2016 12:41 PM EDT ----- Slight anemia noted. May benefit from ferrous sulfate (iron supp) 325mg  once daily with breakfast (may cause nausea initially but normally subsides, and will darken stools). Also cholesterol is elevated. Recommend working on healthy diet (limiting fats and carbs from diet and increasing exercise as tolerated with neck and arthritis). Will recheck in 6 months and if still elevated will recommend a cholesterol lowering medication to reduce cardiovascular event risks. All other labs are WNL and stable.

## 2016-04-09 NOTE — Telephone Encounter (Signed)
Pt is returning call.  CB#[phone]/MW

## 2016-04-09 NOTE — Telephone Encounter (Signed)
LMTCB

## 2016-04-10 ENCOUNTER — Other Ambulatory Visit: Payer: Self-pay | Admitting: Family Medicine

## 2016-04-10 NOTE — Telephone Encounter (Signed)
Had wellness 04/08/2016. Renaldo Fiddler, CMA

## 2016-04-10 NOTE — Telephone Encounter (Signed)
Patient advised as directed below. Patient verbalized understanding.  

## 2016-04-13 LAB — PAP IG AND HPV HIGH-RISK
HPV, high-risk: NEGATIVE
PAP SMEAR COMMENT: 0

## 2016-04-17 ENCOUNTER — Telehealth: Payer: Self-pay

## 2016-04-17 NOTE — Telephone Encounter (Signed)
Patient advised as directed below. Voiced understanding.  Thanks,  -Daliah Chaudoin 

## 2016-04-17 NOTE — Telephone Encounter (Signed)
-----   Message from [name], [place] sent at 04/17/2016 12:15 PM EDT ----- Pap was unsatisfactory but HPV negative. No need to repeat for 3 years per ACOG guidelines.

## 2016-05-13 ENCOUNTER — Telehealth: Payer: Self-pay | Admitting: Family Medicine

## 2016-05-13 DIAGNOSIS — F329 Major depressive disorder, single episode, unspecified: Secondary | ICD-10-CM

## 2016-05-13 DIAGNOSIS — F32A Depression, unspecified: Secondary | ICD-10-CM

## 2016-05-13 MED ORDER — VENLAFAXINE HCL 75 MG PO TABS: 75.0000 mg | ORAL_TABLET | Freq: Three times a day (TID) | ORAL | 1 refills | Status: DC

## 2016-05-13 NOTE — Telephone Encounter (Signed)
Please advise 

## 2016-05-13 NOTE — Telephone Encounter (Signed)
Patient advised.

## 2016-05-13 NOTE — Telephone Encounter (Signed)
It was in error. New rx sent and corrected. I apologize for the error and inconvenience.

## 2016-05-13 NOTE — Telephone Encounter (Signed)
Please review. It looks like patient was previously taking Venlafaxine 75mg , 3 pills daily. The directions were changed during the last refill on 04/10/2016. Please advise whether this change was made in error. Thanks

## 2016-05-13 NOTE — Telephone Encounter (Signed)
Pt states the Rx for venlafaxine XR (EFFEXOR-XR) 75 MG 24 hr capsule was sent in for 1 pill a day.  Pt states she takes 3 pills a day.  Pt is requesting a new Rx sent in.  [facility]  CB#[phone]/MW

## 2016-05-25 ENCOUNTER — Telehealth: Payer: Self-pay | Admitting: Family Medicine

## 2016-05-25 DIAGNOSIS — F3341 Major depressive disorder, recurrent, in partial remission: Secondary | ICD-10-CM

## 2016-05-25 MED ORDER — VENLAFAXINE HCL ER 75 MG PO TB24
75.0000 mg | ORAL_TABLET | Freq: Three times a day (TID) | ORAL | 1 refills | Status: DC
Start: 2016-05-25 — End: 2016-11-16

## 2016-05-25 NOTE — Telephone Encounter (Signed)
Pt call saying the last rx she picked up for the venlafaxine was different than the one she has been taking all along.    Please advise if this rx is correct or did Dr. Caryn Section change it .  The last Rx read venlafaxine 75mg  but before she had taken the ER.  Thank sTeri

## 2016-05-25 NOTE — Telephone Encounter (Signed)
Patient has been advised. KW 

## 2016-05-25 NOTE — Telephone Encounter (Signed)
Looking over medication list patient was previously on Effexor XR and on 05/15/16 you prescibed it again to patient put only Effexor, was there a reason for change? KW

## 2016-05-25 NOTE — Telephone Encounter (Signed)
It should be xr. I must have accidentally clicked the wrong one when changing it last time to change quantity. Updated and sent to [facility]

## 2016-05-28 ENCOUNTER — Telehealth: Payer: Self-pay | Admitting: Cardiovascular Disease

## 2016-05-28 NOTE — Telephone Encounter (Signed)
Received cardiac clearance request from [facility].   Name or date of procedure is not documented on form received.  They are requesting most recent physical exam, EKG, stress test, ECHO & cardiac clearance to be faxed to [phone]. Pt is overdue for 1 year f/u w/ Dr. Rockey Situ - sched for 06/05/16.

## 2016-06-02 ENCOUNTER — Ambulatory Visit: Payer: BLUE CROSS/BLUE SHIELD | Admitting: Cardiovascular Disease

## 2016-06-04 ENCOUNTER — Encounter: Payer: Self-pay | Admitting: Physician Assistant

## 2016-06-04 ENCOUNTER — Ambulatory Visit (INDEPENDENT_AMBULATORY_CARE_PROVIDER_SITE_OTHER): Payer: BLUE CROSS/BLUE SHIELD | Admitting: Physician Assistant

## 2016-06-04 VITALS — BP 130/82 | HR 76 | Ht 69.0 in | Wt 141.0 lb

## 2016-06-04 DIAGNOSIS — I471 Supraventricular tachycardia: Secondary | ICD-10-CM | POA: Diagnosis not present

## 2016-06-04 DIAGNOSIS — Z720 Tobacco use: Secondary | ICD-10-CM | POA: Diagnosis not present

## 2016-06-04 DIAGNOSIS — E782 Mixed hyperlipidemia: Secondary | ICD-10-CM | POA: Diagnosis not present

## 2016-06-04 DIAGNOSIS — R002 Palpitations: Secondary | ICD-10-CM

## 2016-06-04 DIAGNOSIS — Z01810 Encounter for preprocedural cardiovascular examination: Secondary | ICD-10-CM

## 2016-06-04 MED ORDER — ATORVASTATIN CALCIUM 40 MG PO TABS
40.0000 mg | ORAL_TABLET | Freq: Every day | ORAL | 3 refills | Status: DC
Start: 2016-06-04 — End: 2017-07-06

## 2016-06-04 NOTE — Patient Instructions (Addendum)
Medication Instructions: - Your physician has recommended you make the following change in your medication:  1) Start lipitor 40 mg once daily  Labwork: - Your physician recommends that you return for a FASTING lipid/ liver profile: in 6 weeks  Procedures/Testing: - none ordered  Follow-Up: - Your physician recommends that you schedule a follow-up appointment in: early December with Thurmond Butts, [place]  Any Additional Special Instructions Will Be Listed Below (If Applicable).     If you need a refill on your cardiac medications before your next appointment, please call your pharmacy.

## 2016-06-04 NOTE — Progress Notes (Signed)
Cardiology Office Note Date:  06/04/2016  Patient ID:  Gabrielle Pineda, Gabrielle Pineda 1956-06-15, MRN [id number] PCP:  Lelon Huh, MD  Cardiologist:  Dr. Rockey Situ, MD    Chief Complaint: Routine follow up for palpitations   History of Present Illness: Gabrielle Pineda is a 60 y.o. female with history of tachycardia/possible SVT at times requiring adenosine, long standing tobacco abuse since age 74, RA on methotrexate, HLD, and chronic neck and shoulder pain s/p multiple surgeries who presents for pre-operative evaluation for rotator cuff surgery.   She previously underwent a treadmill Myoview in 10/2010 in which she exercised for 5 minutes and 30 seconds without chest pain and acheieved 7.0 METs, exhibited a hypertenisve response with a peak SBP of 166 mmHg. Nuclear report not on file. She was most recently seen by Dr. Rockey Situ, MD on 02/01/2015 for routine follow up and was doing well at that time without any further episodes of tachycardia. The details regarding her tachycardia are not known as there are no records to review in Epic at time of her office visit. It was recommended shoud she have recurrent palpitations she could try prn short-acting diltiazem or propranolol.   She comes in today doing well. She is planning to have right-sided rotator cuff surgery on 06/16/2016 for long-standing shoulder pain. She has not had any chest pain, SOB, palpitations, nausea, vomiting, diaphoresis, dizziness, presyncope, or syncope. She recently underwent cervical spine fusion in August 2017 without any issues. She reports not having any episodes of palpitations for at least 10 years now. She continues to smoke at 1 pack daily. She is not yet ready to quit. She does not have any concerns today.    Past Medical History:  Diagnosis Date  . Anemia   . Cervicalgia   . Depression   . GERD (gastroesophageal reflux disease)   . Heart murmur    on heard when pt is lying  . Other and unspecified hyperlipidemia   .  Tachycardia    d/t questionable anxiety happens every 5- 10 years, sees [facility]    Past Surgical History:  Procedure Laterality Date  . BREAST ENHANCEMENT SURGERY  1981  . CARPAL TUNNEL RELEASE  11/11/2011   Procedure: CARPAL TUNNEL RELEASE;  Surgeon: Floyce Stakes, MD;  Location: [facility];  Service: Neurosurgery;  Laterality: Right;  Right Median Nerve Decompression  . CARPAL TUNNEL RELEASE  2013  . ESOPHAGOGASTRODUODENOSCOPY    . neck disc surgery     plates and screws in neck x 2  . TONSILLECTOMY AND ADENOIDECTOMY  1973     Current Outpatient Prescriptions  Medication Sig Dispense Refill  . albuterol (PROVENTIL HFA;VENTOLIN HFA) 108 (90 Base) MCG/ACT inhaler Inhale 2 puffs into the lungs every 6 (six) hours as needed for wheezing or shortness of breath. 1 Inhaler 6  . b complex vitamins tablet Take 1 tablet by mouth daily.      . cyclobenzaprine (FLEXERIL) 10 MG tablet     . diazepam (VALIUM) 5 MG tablet     . FOLIC ACID PO Take 10 mg tablet twice a day.    Marland Kitchen guaiFENesin (MUCINEX) 600 MG 12 hr tablet Take 1,200 mg by mouth 2 (two) times daily.    Marland Kitchen ibuprofen (ADVIL,MOTRIN) 200 MG tablet Take 200 mg by mouth every 6 (six) hours as needed. For pain    . meloxicam (MOBIC) 7.5 MG tablet Take 7.5 mg by mouth daily.    . Methotrexate, PF, 25 MG/0.5ML SOAJ 0.46mL  once weekly 0.5 mL 11  . Multiple Vitamin (MULTIVITAMIN) tablet Take 1 tablet by mouth daily.      . Nutritional Supplements (ESTROVEN ENERGY PO) Take by mouth daily.    Marland Kitchen omeprazole (PRILOSEC) 20 MG capsule Take 20 mg by mouth daily.    . Oxycodone HCl 10 MG TABS     . sertraline (ZOLOFT) 100 MG tablet TAKE ONE TABLET BY MOUTH ONCE DAILY 90 tablet 1  . traZODone (DESYREL) 50 MG tablet TAKE ONE TO TWO TABLETS BY MOUTH AT BEDTIME 60 tablet 5  . valACYclovir (VALTREX) 1000 MG tablet Take 2 tablets (2,000 mg total) by mouth 2 (two) times daily. FOR 5 DAYS AS NEEDED 40 tablet 3  . Venlafaxine HCl 75 MG TB24 Take 1  tablet (75 mg total) by mouth 3 (three) times daily. 270 each 1   No current facility-administered medications for this visit.     Allergies:   Plaquenil  [hydroxychloroquine]   Social History:  The patient  reports that she has been smoking.  She has a 20.00 pack-year smoking history. She has never used smokeless tobacco. She reports that she does not drink alcohol or use drugs.   Family History:  The patient's family history includes Alcohol abuse in her father and mother; Bipolar disorder in her mother; Pancreatic cancer in her father; Suicidality in her mother.  ROS:   Review of Systems  Constitutional: Negative for chills, diaphoresis, fever, malaise/fatigue and weight loss.  HENT: Negative for congestion.   Eyes: Negative for discharge and redness.  Respiratory: Negative for cough, hemoptysis, sputum production, shortness of breath and wheezing.   Cardiovascular: Negative for chest pain, palpitations, orthopnea, claudication, leg swelling and PND.  Gastrointestinal: Negative for abdominal pain, blood in stool, heartburn, melena, nausea and vomiting.  Genitourinary: Negative for hematuria.  Musculoskeletal: Positive for joint pain. Negative for falls and myalgias.  Skin: Negative for rash.  Neurological: Negative for dizziness, tingling, tremors, sensory change, speech change, focal weakness, loss of consciousness and weakness.  Endo/Heme/Allergies: Does not bruise/bleed easily.  Psychiatric/Behavioral: Negative for substance abuse. The patient is not nervous/anxious.   All other systems reviewed and are negative.    PHYSICAL EXAM:  VS:  BP 130/82 (BP Location: Left Arm, Patient Position: Sitting, Cuff Size: Normal)   Pulse 76   Ht 5\' 9"  (1.753 m)   Wt 141 lb (64 kg)   BMI 20.82 kg/m  BMI: Body mass index is 20.82 kg/m.  Physical Exam  Constitutional: She is oriented to person, place, and time. She appears well-developed and well-nourished.  HENT:  Head: Normocephalic and  atraumatic.  Eyes: Right eye exhibits no discharge. Left eye exhibits no discharge.  Neck: Normal range of motion. No JVD present.  Cardiovascular: Normal rate, regular rhythm, S1 normal, S2 normal and normal heart sounds.  Exam reveals no distant heart sounds, no friction rub, no midsystolic click and no opening snap.   No murmur heard. Pulmonary/Chest: Effort normal and breath sounds normal. No respiratory distress. She has no decreased breath sounds. She has no wheezes. She has no rales. She exhibits no tenderness.  Abdominal: Soft. She exhibits no distension. There is no tenderness.  Musculoskeletal: She exhibits no edema.  Neurological: She is alert and oriented to person, place, and time.  Skin: Skin is warm and dry. No cyanosis. Nails show no clubbing.  Psychiatric: She has a normal mood and affect. Her speech is normal and behavior is normal. Judgment and thought content normal.  EKG:  Was ordered and interpreted by me today. Shows NSR, 75 bpm, no significant st/t changes   Recent Labs: 04/08/2016: ALT 22; BUN 5; Creatinine, Ser 0.69; Platelets 372; Potassium 3.9; Sodium 142; TSH 2.130  04/08/2016: Chol/HDL Ratio 5.0; Cholesterol, Total 248; HDL 50; LDL Calculated 160; Triglycerides 188   CrCl cannot be calculated (Patient's most recent lab result is older than the maximum 21 days allowed.).   Wt Readings from Last 3 Encounters:  06/04/16 141 lb (64 kg)  04/08/16 137 lb 3.2 oz (62.2 kg)  10/11/15 140 lb 3.2 oz (63.6 kg)     Other studies reviewed: Additional studies/records reviewed today include: summarized above  ASSESSMENT AND PLAN:  1. Palpitations: No issues in at least 10 years. Potassium 3.9 in 03/2016, recommend goal of 4.0. Would benefit from foods high in potassium. TSH normal 03/2016. Recommend checking magnesium and replete to a goal of 2.0.   2. Pre-operative evaluation: No symptoms concerning for angina. She is cleared for non-cardiac surgery at a low-risk.  Limit peri-operative IV fluids given her history of presumed SVT. Monitor electrolytes as above.   3. Tobacco abuse: Cessation is advised. She is not yet ready to quit.   4. HLD: Followed by PCP. Lipids checked in August 2017 showed LDL of 160. Start Lipitor 40 mg daily with a recheck fasting lipid and LFT in 6 weeks.   5. Macrocytic anemia: Per PCP.   Disposition: F/u with me in 6 weeks.   Current medicines are reviewed at length with the patient today.  The patient did not have any concerns regarding medicines.  Melvern Banker PA-C 06/04/2016 3:19 PM     [address] [place], [place] [address] [phone]

## 2016-06-05 ENCOUNTER — Ambulatory Visit: Payer: BLUE CROSS/BLUE SHIELD | Admitting: Cardiovascular Disease

## 2016-06-12 ENCOUNTER — Other Ambulatory Visit: Payer: Self-pay | Admitting: Family Medicine

## 2016-06-12 DIAGNOSIS — G4700 Insomnia, unspecified: Secondary | ICD-10-CM

## 2016-06-15 ENCOUNTER — Other Ambulatory Visit: Payer: Self-pay

## 2016-06-15 MED ORDER — VALACYCLOVIR HCL 1 G PO TABS: 2000.0000 mg | ORAL_TABLET | Freq: Two times a day (BID) | ORAL | 3 refills | Status: DC

## 2016-06-15 NOTE — Telephone Encounter (Signed)
Pharmacy sent fax requesting refill.

## 2016-06-16 HISTORY — PX: ROTATOR CUFF REPAIR: SHX139

## 2016-06-18 ENCOUNTER — Ambulatory Visit: Payer: BLUE CROSS/BLUE SHIELD | Admitting: Cardiovascular Disease

## 2016-07-16 ENCOUNTER — Other Ambulatory Visit (INDEPENDENT_AMBULATORY_CARE_PROVIDER_SITE_OTHER): Payer: BLUE CROSS/BLUE SHIELD | Admitting: *Deleted

## 2016-07-16 ENCOUNTER — Other Ambulatory Visit: Payer: Self-pay | Admitting: *Deleted

## 2016-07-16 DIAGNOSIS — E782 Mixed hyperlipidemia: Secondary | ICD-10-CM | POA: Diagnosis not present

## 2016-07-16 DIAGNOSIS — Z1239 Encounter for other screening for malignant neoplasm of breast: Secondary | ICD-10-CM

## 2016-07-17 LAB — LIPID PANEL
CHOL/HDL RATIO: 3.5 ratio (ref 0.0–4.4)
CHOLESTEROL TOTAL: 168 mg/dL (ref 100–199)
HDL: 48 mg/dL (ref >39)
LDL Calculated: 90 mg/dL (ref 0–99)
TRIGLYCERIDES: 151 mg/dL — AB (ref 0–149)
VLDL Cholesterol Cal: 30 mg/dL (ref 5–40)

## 2016-07-17 LAB — HEPATIC FUNCTION PANEL
ALK PHOS: 97 IU/L (ref 39–117)
ALT: 12 IU/L (ref 0–32)
AST: 17 IU/L (ref 0–40)
Albumin: 4.2 g/dL (ref 3.5–5.5)
Bilirubin Total: 0.2 mg/dL (ref 0.0–1.2)
Bilirubin, Direct: 0.08 mg/dL (ref 0.00–0.40)
TOTAL PROTEIN: 6.2 g/dL (ref 6.0–8.5)

## 2016-07-23 ENCOUNTER — Ambulatory Visit (INDEPENDENT_AMBULATORY_CARE_PROVIDER_SITE_OTHER): Payer: BLUE CROSS/BLUE SHIELD | Admitting: Cardiovascular Disease

## 2016-07-23 ENCOUNTER — Encounter: Payer: Self-pay | Admitting: Cardiovascular Disease

## 2016-07-23 VITALS — BP 122/80 | HR 71 | Ht 69.0 in | Wt 144.5 lb

## 2016-07-23 DIAGNOSIS — Z23 Encounter for immunization: Secondary | ICD-10-CM | POA: Diagnosis not present

## 2016-07-23 DIAGNOSIS — I471 Supraventricular tachycardia: Secondary | ICD-10-CM | POA: Diagnosis not present

## 2016-07-23 DIAGNOSIS — R002 Palpitations: Secondary | ICD-10-CM | POA: Diagnosis not present

## 2016-07-23 DIAGNOSIS — F17200 Nicotine dependence, unspecified, uncomplicated: Secondary | ICD-10-CM

## 2016-07-23 DIAGNOSIS — E7800 Pure hypercholesterolemia, unspecified: Secondary | ICD-10-CM

## 2016-07-23 NOTE — Progress Notes (Signed)
Cardiology Office Note  Date:  07/23/2016   ID:  Gabrielle Pineda, Gabrielle Pineda 1955-10-13, MRN [id number]  PCP:  Lelon Huh, MD   Chief Complaint  Patient presents with  . other    1 month follow up. Meds reviewed by the pt. verbally. "doing well." Pt. has started physical therapy s/p neck and shoulder surgery.      HPI:  Gabrielle Pineda is a very pleasant 60 yo old woman with a long history of smoking since age 50, hyperlipidemia, history of tachycardia/possible SVT  at times requiring adenosine? (details not available), and chest pain who presents for routine follow up of her tachycardia. She has chronic neck and shoulder pain. Prior surgeries.  In follow-up she reports that she is doing well Since her last clinic visit she has had repeat Neck surgery for cervical disk  Also with Right rotator cuff surgery October 2017 Started PT on her shoulder Not working right now Was told by her neck surgeon that she should not work anymore  Denies any significant tachycardia, palpitations Denies any significant shortness of breath or chest discomfort Recently seen for preoperative evaluation by our clinic, started on cholesterol medication, tolerating Lipitor 40 mg daily Continues to smoke, not interested in Chantix at this time  Lab work reviewed with her HBA1C 5.1, Total chol 248 down to 168  Previously seen by rheumatology for rheumatoid arthritis. Currently on methotrexate  No EKG on today's visit  Other past medical history  On previous office visits, reported having chronic neck and shoulder pain.   Previous EKG shows normal sinus rhythm with a rate of 76 beats per minute with no significant ST or T wave changes  Lab work from January 2014 shows total cholesterol 196, LDL 114, HDL 59 Lab work from February 2015 shows total cholesterol 212, LDL 136, HDL 66  PMH:   has a past medical history of Anemia; Cervicalgia; Depression; GERD (gastroesophageal reflux disease); Heart murmur;  Other and unspecified hyperlipidemia; and Tachycardia.  PSH:    Past Surgical History:  Procedure Laterality Date  . BREAST ENHANCEMENT SURGERY  1981  . CARPAL TUNNEL RELEASE  11/11/2011   Procedure: CARPAL TUNNEL RELEASE;  Surgeon: Floyce Stakes, MD;  Location: [facility];  Service: Neurosurgery;  Laterality: Right;  Right Median Nerve Decompression  . CARPAL TUNNEL RELEASE  2013  . ESOPHAGOGASTRODUODENOSCOPY    . neck disc surgery     plates and screws in neck x 2  . ROTATOR CUFF REPAIR  06/16/2016   right shoulder   . TONSILLECTOMY AND ADENOIDECTOMY  1973     Current Outpatient Prescriptions  Medication Sig Dispense Refill  . albuterol (PROVENTIL HFA;VENTOLIN HFA) 108 (90 Base) MCG/ACT inhaler Inhale 2 puffs into the lungs every 6 (six) hours as needed for wheezing or shortness of breath. 1 Inhaler 6  . atorvastatin (LIPITOR) 40 MG tablet Take 1 tablet (40 mg total) by mouth daily. 90 tablet 3  . b complex vitamins tablet Take 1 tablet by mouth daily.      Marland Kitchen FOLIC ACID PO Take 10 mg tablet twice a day.    Marland Kitchen guaiFENesin (MUCINEX) 600 MG 12 hr tablet Take 1,200 mg by mouth 2 (two) times daily.    Marland Kitchen ibuprofen (ADVIL,MOTRIN) 200 MG tablet Take 200 mg by mouth every 6 (six) hours as needed. For pain    . meloxicam (MOBIC) 7.5 MG tablet Take 7.5 mg by mouth daily.    . Methotrexate, PF, 25 MG/0.5ML SOAJ 0.6mL  once weekly 0.5 mL 11  . Multiple Vitamin (MULTIVITAMIN) tablet Take 1 tablet by mouth daily.      . Nutritional Supplements (ESTROVEN ENERGY PO) Take by mouth daily.    Marland Kitchen omeprazole (PRILOSEC) 20 MG capsule Take 20 mg by mouth daily.    . sertraline (ZOLOFT) 100 MG tablet TAKE ONE TABLET BY MOUTH ONCE DAILY 90 tablet 1  . traMADol (ULTRAM) 50 MG tablet Take by mouth every 6 (six) hours as needed.    . traZODone (DESYREL) 50 MG tablet TAKE ONE TO TWO TABLETS BY MOUTH AT BEDTIME 180 tablet 1  . valACYclovir (VALTREX) 1000 MG tablet Take 2 tablets (2,000 mg total) by mouth 2  (two) times daily. FOR 5 DAYS AS NEEDED 40 tablet 3  . Venlafaxine HCl 75 MG TB24 Take 1 tablet (75 mg total) by mouth 3 (three) times daily. 270 each 1   No current facility-administered medications for this visit.      Allergies:   Plaquenil  [hydroxychloroquine]   Social History:  The patient  reports that she has been smoking.  She has a 20.00 pack-year smoking history. She has never used smokeless tobacco. She reports that she does not drink alcohol or use drugs.   Family History:   family history includes Alcohol abuse in her father and mother; Bipolar disorder in her mother; Pancreatic cancer in her father; Suicidality in her mother.    Review of Systems: Review of Systems  Constitutional: Negative.   Respiratory: Negative.   Cardiovascular: Negative.   Gastrointestinal: Negative.   Musculoskeletal: Positive for joint pain.  Neurological: Negative.   Psychiatric/Behavioral: Negative.   All other systems reviewed and are negative.    PHYSICAL EXAM: VS:  BP 122/80 (BP Location: Left Arm, Patient Position: Sitting, Cuff Size: Normal)   Pulse 71   Ht 5\' 9"  (1.753 m)   Wt 144 lb 8 oz (65.5 kg)   BMI 21.34 kg/m  , BMI Body mass index is 21.34 kg/m. GEN: Well nourished, well developed, in no acute distress  HEENT: normal  Neck: no JVD, carotid bruits, or masses Cardiac: RRR; no murmurs, rubs, or gallops,no edema  Respiratory:  clear to auscultation bilaterally, normal work of breathing GI: soft, nontender, nondistended, + BS MS: no deformity or atrophy  Skin: warm and dry, no rash Neuro:  Strength and sensation are intact Psych: euthymic mood, full affect    Recent Labs: 04/08/2016: BUN 5; Creatinine, Ser 0.69; Platelets 372; Potassium 3.9; Sodium 142; TSH 2.130 07/16/2016: ALT 12    Lipid Panel Lab Results  Component Value Date   CHOL 168 07/16/2016   HDL 48 07/16/2016   LDLCALC 90 07/16/2016   TRIG 151 (H) 07/16/2016      Wt Readings from Last 3  Encounters:  07/23/16 144 lb 8 oz (65.5 kg)  06/04/16 141 lb (64 kg)  04/08/16 137 lb 3.2 oz (62.2 kg)       ASSESSMENT AND PLAN:  Palpitations - Plan: CANCELED: EKG 12-Lead Denies any recent episodes of palpitations. No further workup needed  Paroxysmal supraventricular tachycardia ([place]) - Plan: CANCELED: EKG 12-Lead Denies any recent episodes on today's visit, also on her last clinic visit with myself No further workup No other active cardiac issues  Encounter for immunization - Plan: Flu Vaccine QUAD 36+ mos IM Flu shot provided left arm  Hypercholesteremia Cholesterol is at goal on the current lipid regimen. No changes to the medications were made.  SMOKER We have encouraged her to  continue to work on weaning her cigarettes and smoking cessation. She will continue to work on this and does not want any assistance with chantix.    Total encounter time more than 15 minutes  Greater than 50% was spent in counseling and coordination of care with the patient   Disposition:   F/U  12 months if needed/when necessary   Orders Placed This Encounter  Procedures  . Flu Vaccine QUAD 36+ mos IM     Signed, Esmond Plants, M.D., Ph.D. 07/23/2016  [place], [place]

## 2016-07-23 NOTE — Patient Instructions (Addendum)
Medication Instructions:   No medication changes made  Labwork:  No new labs needed  Testing/Procedures:  No further testing at this time   I recommend watching educational videos on topics of interest to you at:       [email or web address]  Enter code: [email or web address]    Follow-Up: It was a pleasure seeing you in the office today. Please call us if you have new issues that need to be addressed before your next appt.  [phone]  Your physician wants you to follow-up in: 12 months.  You will receive a reminder letter in the mail two months in advance. If you don't receive a letter, please call our office to schedule the follow-up appointment.  If you need a refill on your cardiac medications before your next appointment, please call your pharmacy.     Steps to Quit Smoking Smoking tobacco can be bad for your health. It can also affect almost every organ in your body. Smoking puts you and people around you at risk for many serious long-lasting (chronic) diseases. Quitting smoking is hard, but it is one of the best things that you can do for your health. It is never too late to quit. What are the benefits of quitting smoking? When you quit smoking, you lower your risk for getting serious diseases and conditions. They can include:  Lung cancer or lung disease.  Heart disease.  Stroke.  Heart attack.  Not being able to have children (infertility).  Weak bones (osteoporosis) and broken bones (fractures). If you have coughing, wheezing, and shortness of breath, those symptoms may get better when you quit. You may also get sick less often. If you are pregnant, quitting smoking can help to lower your chances of having a baby of low birth weight. What can I do to help me quit smoking? Talk with your doctor about what can help you quit smoking. Some things you can do (strategies) include:  Quitting smoking totally, instead of slowly cutting back how much you smoke over a period of  time.  Going to in-person counseling. You are more likely to quit if you go to many counseling sessions.  Using resources and support systems, such as:  Online chats with a Social worker.  Phone quitlines.  Printed Furniture conservator/restorer.  Support groups or group counseling.  Text messaging programs.  Mobile phone apps or applications.  Taking medicines. Some of these medicines may have nicotine in them. If you are pregnant or breastfeeding, do not take any medicines to quit smoking unless your doctor says it is okay. Talk with your doctor about counseling or other things that can help you. Talk with your doctor about using more than one strategy at the same time, such as taking medicines while you are also going to in-person counseling. This can help make quitting easier. What things can I do to make it easier to quit? Quitting smoking might feel very hard at first, but there is a lot that you can do to make it easier. Take these steps:  Talk to your family and friends. Ask them to support and encourage you.  Call phone quitlines, reach out to support groups, or work with a Social worker.  Ask people who smoke to not smoke around you.  Avoid places that make you want (trigger) to smoke, such as:  Bars.  Parties.  Smoke-break areas at work.  Spend time with people who do not smoke.  Lower the stress in your life. Stress can make you want to smoke.  Try these things to help your stress:  Getting regular exercise.  Deep-breathing exercises.  Yoga.  Meditating.  Doing a body scan. To do this, close your eyes, focus on one area of your body at a time from head to toe, and notice which parts of your body are tense. Try to relax the muscles in those areas.  Download or buy apps on your mobile phone or tablet that can help you stick to your quit plan. There are many free apps, such as QuitGuide from the [employer] Office manager for Disease Control and Prevention). You can find more support from  smokefree.gov and other websites. This information is not intended to replace advice given to you by your health care provider. Make sure you discuss any questions you have with your health care provider. Document Released: 05/30/2009 Document Revised: 03/31/2016 Document Reviewed: 12/18/2014 Elsevier Interactive Patient Education  2017 [employer].  Smoking Hazards Smoking cigarettes is extremely bad for your health. Tobacco smoke has over 200 known poisons in it. It contains the poisonous gases nitrogen oxide and carbon monoxide. There are over 60 chemicals in tobacco smoke that cause cancer. Some of the chemicals found in cigarette smoke include:   Cyanide.   Benzene.   Formaldehyde.   Methanol (wood alcohol).   Acetylene (fuel used in welding torches).   Ammonia.  Even smoking lightly shortens your life expectancy by several years. You can greatly reduce the risk of medical problems for you and your family by stopping now. Smoking is the most preventable cause of death and disease in our society. Within days of quitting smoking, your circulation improves, you decrease the risk of having a heart attack, and your lung capacity improves. There may be some increased phlegm in the first few days after quitting, and it may take months for your lungs to clear up completely. Quitting for 10 years reduces your risk of developing lung cancer to almost that of a nonsmoker.  WHAT ARE THE RISKS OF SMOKING? Cigarette smokers have an increased risk of many serious medical problems, including:  Lung cancer.   Lung disease (such as pneumonia, bronchitis, and emphysema).   Heart attack and chest pain due to the heart not getting enough oxygen (angina).   Heart disease and peripheral blood vessel disease.   Hypertension.   Stroke.   Oral cancer (cancer of the lip, mouth, or voice box).   Bladder cancer.   Pancreatic cancer.   Cervical cancer.   Pregnancy complications,  including premature birth.   Stillbirths and smaller newborn babies, birth defects, and genetic damage to sperm.   Early menopause.   Lower estrogen level for women.   Infertility.   Facial wrinkles.   Blindness.   Increased risk of broken bones (fractures).   Senile dementia.   Stomach ulcers and internal bleeding.   Delayed wound healing and increased risk of complications during surgery. Because of secondhand smoke exposure, children of smokers have an increased risk of the following:   Sudden infant death syndrome (SIDS).   Respiratory infections.   Lung cancer.   Heart disease.   Ear infections.  WHY IS SMOKING ADDICTIVE? Nicotine is the chemical agent in tobacco that is capable of causing addiction or dependence. When you smoke and inhale, nicotine is absorbed rapidly into the bloodstream through your lungs. Both inhaled and noninhaled nicotine may be addictive.  WHAT ARE THE BENEFITS OF QUITTING?  There are many health benefits to quitting smoking. Some are:   The likelihood of developing  cancer and heart disease decreases. Health improvements are seen almost immediately.   Blood pressure, pulse rate, and breathing patterns start returning to normal soon after quitting.   People who quit may see an improvement in their overall quality of life.  HOW DO YOU QUIT SMOKING? Smoking is an addiction with both physical and psychological effects, and longtime habits can be hard to change. Your health care provider can recommend:  Programs and community resources, which may include group support, education, or therapy.  Replacement products, such as patches, gum, and nasal sprays. Use these products only as directed. Do not replace cigarette smoking with electronic cigarettes (commonly called e-cigarettes). The safety of e-cigarettes is unknown, and some may contain harmful chemicals. FOR MORE INFORMATION  American Lung Association:  www.lung.org  American Cancer Society: www.cancer.org This information is not intended to replace advice given to you by your health care provider. Make sure you discuss any questions you have with your health care provider. Document Released: 09/10/2004 Document Revised: 11/25/2015 Document Reviewed: 01/23/2013 Elsevier Interactive Patient Education  2017 [place] WHAT IS SECONDHAND SMOKE? Secondhand smoke is smoke that comes from burning tobacco. It could be the smoke from a cigarette, a pipe, or a cigar. Even if you are not the one smoking, secondhand smoke exposes you to the dangers of smoking. This is called involuntary, or passive, smoking. There are two types of secondhand smoke:  Sidestream smoke is the smoke that comes off the lighted end of a cigarette, pipe, or cigar.  This type of smoke has the highest amount of cancer-causing agents (carcinogens).  The particles in sidestream smoke are smaller. They get into your lungs more easily.  Mainstream smoke is the smoke that is exhaled by a person who is smoking.  This type of smoke is also dangerous to your health. HOW CAN SECONDHAND SMOKE AFFECT MY HEALTH? Studies show that there is no safe level of secondhand smoke. This smoke contains thousands of chemicals. At least 7 of them are known to cause cancer. Secondhand smoke can also cause many other health problems. It has been linked to:  Lung cancer.  Cancer of the voice box (larynx) or throat.  Cancer of the sinuses.  Brain cancer.  Bladder cancer.  Stomach cancer.  Breast cancer.  White blood cell cancers (lymphoma and leukemia).  Brain and liver tumors in children.  Heart disease and stroke in adults.  Pregnancy loss (miscarriage).  Diseases in children, such as:  Asthma.  Lung infections.  Ear infections.  Sudden infant death syndrome (SIDS).  Slow growth. WHERE CAN I BE AT RISK FOR EXPOSURE TO SECONDHAND SMOKE?  For adults,  the workplace is the main source of exposure to secondhand smoke.  Your workplace should have a policy separating smoking areas from nonsmoking areas.  Smoking areas should have a system for ventilating and cleaning the air.  For children, the home may be the most dangerous place for exposure to secondhand smoke.  Children who live in apartment buildings may be at risk from smoke drifting from hallways or other people's homes.  For everyone, many public places are possible sources of exposure to secondhand smoke.  These places include restaurants, shopping centers, and parks. HOW CAN I REDUCE MY RISK FOR EXPOSURE TO SECONDHAND SMOKE? The most important thing you can do is not smoke. Discourage family members from smoking. Other ways to reduce exposure for you and your family include the following:  Keep your home smoke free.  Make sure  your child care providers do not smoke.  Warn your child about the dangers of smoking and secondhand smoke.  Do not allow smoking in your car. When someone smokes in a car, all the damaging chemicals from the smoke are confined in a small area.  Avoid public places where smoking is allowed. This information is not intended to replace advice given to you by your health care provider. Make sure you discuss any questions you have with your health care provider. Document Released: 09/10/2004 Document Revised: 03/12/2016 Document Reviewed: 11/17/2013 Elsevier Interactive Patient Education  2017 [employer].

## 2016-09-23 ENCOUNTER — Ambulatory Visit (INDEPENDENT_AMBULATORY_CARE_PROVIDER_SITE_OTHER): Payer: BLUE CROSS/BLUE SHIELD | Admitting: Physician Assistant

## 2016-09-23 ENCOUNTER — Encounter: Payer: Self-pay | Admitting: Physician Assistant

## 2016-09-23 VITALS — BP 122/84 | HR 84 | Temp 98.6°F | Resp 20 | Wt 143.0 lb

## 2016-09-23 DIAGNOSIS — J441 Chronic obstructive pulmonary disease with (acute) exacerbation: Secondary | ICD-10-CM

## 2016-09-23 MED ORDER — AZITHROMYCIN 250 MG PO TABS
ORAL_TABLET | ORAL | 0 refills | Status: DC
Start: 2016-09-23 — End: 2017-04-09

## 2016-09-23 MED ORDER — PREDNISONE 20 MG PO TABS: 40.0000 mg | ORAL_TABLET | Freq: Every day | ORAL | 0 refills | Status: DC

## 2016-09-23 MED ORDER — IPRATROPIUM-ALBUTEROL 0.5-2.5 (3) MG/3ML IN SOLN: 3.0000 mL | Freq: Once | RESPIRATORY_TRACT | Status: DC

## 2016-09-23 NOTE — Patient Instructions (Signed)
Chronic Obstructive Pulmonary Disease Chronic obstructive pulmonary disease (COPD) is a common lung problem. In COPD, the flow of air from the lungs is limited. The way your lungs work will probably never return to normal, but there are things you can do to improve your lungs and make yourself feel better. Your doctor may treat your condition with:  Medicines.  Oxygen.  Lung surgery.  Changes to your diet.  Rehabilitation. This may involve a team of specialists. Follow these instructions at home:  Take all medicines as told by your doctor.  Avoid medicines or cough syrups that dry up your airway (such as antihistamines) and do not allow you to get rid of thick spit. You do not need to avoid them if told differently by your doctor.  If you smoke, stop. Smoking makes the problem worse.  Avoid being around things that make your breathing worse (like smoke, chemicals, and fumes).  Use oxygen therapy and therapy to help improve your lungs (pulmonary rehabilitation) if told by your doctor. If you need home oxygen therapy, ask your doctor if you should buy a tool to measure your oxygen level (oximeter).  Avoid people who have a sickness you can catch (contagious).  Avoid going outside when it is very hot, cold, or humid.  Eat healthy foods. Eat smaller meals more often. Rest before meals.  Stay active, but remember to also rest.  Make sure to get all the shots (vaccines) your doctor recommends. Ask your doctor if you need a pneumonia shot.  Learn and use tips on how to relax.  Learn and use tips on how to control your breathing as told by your doctor. Try: 1. Breathing in (inhaling) through your nose for 1 second. Then, pucker your lips and breath out (exhale) through your lips for 2 seconds. 2. Putting one hand on your belly (abdomen). Breathe in slowly through your nose for 1 second. Your hand on your belly should move out. Pucker your lips and breathe out slowly through your lips.  Your hand on your belly should move in as you breathe out.  Learn and use controlled coughing to clear thick spit from your lungs. The steps are: 1. Lean your head a little forward. 2. Breathe in deeply. 3. Try to hold your breath for 3 seconds. 4. Keep your mouth slightly open while coughing 2 times. 5. Spit any thick spit out into a tissue. 6. Rest and do the steps again 1 or 2 times as needed. Contact a doctor if:  You cough up more thick spit than usual.  There is a change in the color or thickness of the spit.  It is harder to breathe than usual.  Your breathing is faster than usual. Get help right away if:  You have shortness of breath while resting.  You have shortness of breath that stops you from:  Being able to talk.  Doing normal activities.  You chest hurts for longer than 5 minutes.  Your skin color is more blue than usual.  Your pulse oximeter shows that you have low oxygen for longer than 5 minutes. This information is not intended to replace advice given to you by your health care provider. Make sure you discuss any questions you have with your health care provider. Document Released: 01/20/2008 Document Revised: 01/09/2016 Document Reviewed: 03/30/2013 Elsevier Interactive Patient Education  2017 Elsevier Inc.  

## 2016-09-23 NOTE — Progress Notes (Signed)
[facility]  Chief Complaint  Patient presents with  . URI    Started a week ago.  . Sinusitis    Subjective:    Patient ID: Gabrielle Pineda, female    DOB: [birth year], 61 y.o.   MRN: [id number]  Upper Respiratory Infection: Gabrielle Pineda is a 61 y.o. female with a past medical history significant for COPD c/o symptoms of a URI, possible sinusitis. Symptoms include bilateral ear pain, congestion, cough and sore throat. Onset of symptoms was 1 week ago, gradually worsening since that time. She is having some SOB, increased sputum and sputum purulence. She also c/o congestion, cough described as productive and nasal congestion for the past 1 week .  She is drinking plenty of fluids. Evaluation to date: none. Treatment to date: antihistamines, cough suppressants and decongestants. The treatment has provided no relief.   Review of Systems  Constitutional: Positive for chills, diaphoresis, fatigue and fever. Negative for activity change, appetite change and unexpected weight change.  HENT: Positive for congestion, ear pain, nosebleeds, postnasal drip, rhinorrhea, sinus pain, sinus pressure, sore throat, trouble swallowing and voice change. Negative for ear discharge, hearing loss and tinnitus.   Eyes: Negative.   Respiratory: Positive for cough, chest tightness, shortness of breath and wheezing. Negative for apnea, choking and stridor.   Gastrointestinal: Positive for diarrhea. Negative for abdominal distention, abdominal pain, anal bleeding, blood in stool, constipation, nausea, rectal pain and vomiting.  Neurological: Positive for headaches. Negative for dizziness and light-headedness.       Objective:   BP 122/84 (BP Location: Right Arm, Patient Position: Sitting, Cuff Size: Normal)   Pulse 84   Temp 98.6 F (37 C) (Oral)   Resp 20   Wt 143 lb (64.9 kg)   SpO2 95%   BMI 21.12 kg/m   Patient Active Problem List   Diagnosis Date Noted   . COPD (chronic obstructive pulmonary disease) ([place]) 04/10/2015  . Family history of malignant neoplasm of pancreas 04/03/2015  . Hypercholesteremia 04/03/2015  . Disorder of iron metabolism 04/03/2015  . Premature menopause 04/03/2015  . Weight loss 04/03/2015  . Delayed onset of urination 04/03/2015  . Acid reflux 10/11/2014  . Closed fracture of distal phalanx of thumb 08/15/2014  . Arthritis, degenerative 01/30/2014  . Arthritis or polyarthritis, rheumatoid ([place]) 01/30/2014  . Shortness of breath 12/22/2013  . Other and unspecified hyperlipidemia 09/22/2010  . SMOKER 09/22/2010  . Paroxysmal supraventricular tachycardia ([place]) 09/22/2010  . CHEST PAIN UNSPECIFIED 09/22/2010  . ELECTROCARDIOGRAM, ABNORMAL 09/22/2010  . B-complex deficiency 09/09/2007  . CN (constipation) 06/26/2007  . Clinical depression 06/26/2007  . Cold sore 06/26/2007  . H/O alcohol abuse 06/26/2007  . Cannot sleep 06/26/2007  . Localized osteoarthrosis, hand 06/26/2007  . Menopausal symptom 06/26/2007  . Fast heart beat 06/26/2007    Outpatient Encounter Prescriptions as of 09/23/2016  Medication Sig  . albuterol (PROVENTIL HFA;VENTOLIN HFA) 108 (90 Base) MCG/ACT inhaler Inhale 2 puffs into the lungs every 6 (six) hours as needed for wheezing or shortness of breath.  Marland Kitchen atorvastatin (LIPITOR) 40 MG tablet Take 1 tablet (40 mg total) by mouth daily.  Marland Kitchen azithromycin (ZITHROMAX) 250 MG tablet 2 tabs on day 1, 1 tab on following 4 days  . b complex vitamins tablet Take 1 tablet by mouth daily.    Marland Kitchen FOLIC ACID PO Take 10 mg tablet twice a day.  Marland Kitchen guaiFENesin (MUCINEX) 600 MG 12 hr tablet Take 1,200 mg by mouth 2 (  two) times daily.  Marland Kitchen ibuprofen (ADVIL,MOTRIN) 200 MG tablet Take 200 mg by mouth every 6 (six) hours as needed. For pain  . meloxicam (MOBIC) 7.5 MG tablet Take 7.5 mg by mouth daily.  . Methotrexate, PF, 25 MG/0.5ML SOAJ 0.44mL once weekly  . Multiple Vitamin (MULTIVITAMIN) tablet Take 1 tablet by  mouth daily.    . Nutritional Supplements (ESTROVEN ENERGY PO) Take by mouth daily.  Marland Kitchen omeprazole (PRILOSEC) 20 MG capsule Take 20 mg by mouth daily.  . predniSONE (DELTASONE) 20 MG tablet Take 2 tablets (40 mg total) by mouth daily with breakfast.  . sertraline (ZOLOFT) 100 MG tablet TAKE ONE TABLET BY MOUTH ONCE DAILY  . traMADol (ULTRAM) 50 MG tablet Take by mouth every 6 (six) hours as needed.  . traZODone (DESYREL) 50 MG tablet TAKE ONE TO TWO TABLETS BY MOUTH AT BEDTIME  . valACYclovir (VALTREX) 1000 MG tablet Take 2 tablets (2,000 mg total) by mouth 2 (two) times daily. FOR 5 DAYS AS NEEDED  . Venlafaxine HCl 75 MG TB24 Take 1 tablet (75 mg total) by mouth 3 (three) times daily.   Facility-Administered Encounter Medications as of 09/23/2016  Medication  . ipratropium-albuterol (DUONEB) 0.5-2.5 (3) MG/3ML nebulizer solution 3 mL    Allergies  Allergen Reactions  . Plaquenil  [Hydroxychloroquine] Rash       Physical Exam  Constitutional: She appears well-developed and well-nourished. She appears ill.  HENT:  Right Ear: Tympanic membrane normal.  Left Ear: Tympanic membrane normal.  Mouth/Throat: Oropharynx is clear and moist. No oropharyngeal exudate.  Neck: Neck supple.  Cardiovascular: Normal rate and regular rhythm.   Pulmonary/Chest: Effort normal. No respiratory distress. She has wheezes.  Diffuse wheezes and ronchi in bilateral lung fields.  Lymphadenopathy:    She has no cervical adenopathy.  Skin: Skin is warm and dry.  Psychiatric: She has a normal mood and affect.       Assessment & Plan:   1. COPD exacerbation ([place])  Administered duoneb in office with marked resolution of wheezing upon re-examination. Patient will take abx and steroids as below and follow up in two days with Dr. Caryn Section before the weekend. Should use albuterol inhaler Q6H.  - ipratropium-albuterol (DUONEB) 0.5-2.5 (3) MG/3ML nebulizer solution 3 mL; Take 3 mLs by nebulization once. -  predniSONE (DELTASONE) 20 MG tablet; Take 2 tablets (40 mg total) by mouth daily with breakfast.  Dispense: 10 tablet; Refill: 0 - azithromycin (ZITHROMAX) 250 MG tablet; 2 tabs on day 1, 1 tab on following 4 days  Dispense: 6 tablet; Refill: 0  Return in about 2 days (around 09/25/2016) for COPD exacerbation w/ Dr. Caryn Section.  Patient Instructions  Chronic Obstructive Pulmonary Disease Chronic obstructive pulmonary disease (COPD) is a common lung problem. In COPD, the flow of air from the lungs is limited. The way your lungs work will probably never return to normal, but there are things you can do to improve your lungs and make yourself feel better. Your doctor may treat your condition with:  Medicines.  Oxygen.  Lung surgery.  Changes to your diet.  Rehabilitation. This may involve a team of specialists. Follow these instructions at home:  Take all medicines as told by your doctor.  Avoid medicines or cough syrups that dry up your airway (such as antihistamines) and do not allow you to get rid of thick spit. You do not need to avoid them if told differently by your doctor.  If you smoke, stop. Smoking makes the problem  worse.  Avoid being around things that make your breathing worse (like smoke, chemicals, and fumes).  Use oxygen therapy and therapy to help improve your lungs (pulmonary rehabilitation) if told by your doctor. If you need home oxygen therapy, ask your doctor if you should buy a tool to measure your oxygen level (oximeter).  Avoid people who have a sickness you can catch (contagious).  Avoid going outside when it is very hot, cold, or humid.  Eat healthy foods. Eat smaller meals more often. Rest before meals.  Stay active, but remember to also rest.  Make sure to get all the shots (vaccines) your doctor recommends. Ask your doctor if you need a pneumonia shot.  Learn and use tips on how to relax.  Learn and use tips on how to control your breathing as told by  your doctor. Try: 1. Breathing in (inhaling) through your nose for 1 second. Then, pucker your lips and breath out (exhale) through your lips for 2 seconds. 2. Putting one hand on your belly (abdomen). Breathe in slowly through your nose for 1 second. Your hand on your belly should move out. Pucker your lips and breathe out slowly through your lips. Your hand on your belly should move in as you breathe out.  Learn and use controlled coughing to clear thick spit from your lungs. The steps are: 1. Lean your head a little forward. 2. Breathe in deeply. 3. Try to hold your breath for 3 seconds. 4. Keep your mouth slightly open while coughing 2 times. 5. Spit any thick spit out into a tissue. 6. Rest and do the steps again 1 or 2 times as needed. Contact a doctor if:  You cough up more thick spit than usual.  There is a change in the color or thickness of the spit.  It is harder to breathe than usual.  Your breathing is faster than usual. Get help right away if:  You have shortness of breath while resting.  You have shortness of breath that stops you from:  Being able to talk.  Doing normal activities.  You chest hurts for longer than 5 minutes.  Your skin color is more blue than usual.  Your pulse oximeter shows that you have low oxygen for longer than 5 minutes. This information is not intended to replace advice given to you by your health care provider. Make sure you discuss any questions you have with your health care provider. Document Released: 01/20/2008 Document Revised: 01/09/2016 Document Reviewed: 03/30/2013 Elsevier Interactive Patient Education  2017 [employer].     The entirety of the information documented in the History of Present Illness, Review of Systems and Physical Exam were personally obtained by me. Portions of this information were initially documented by Ashley Royalty, CMA and reviewed by me for thoroughness and accuracy.

## 2016-09-25 ENCOUNTER — Ambulatory Visit (INDEPENDENT_AMBULATORY_CARE_PROVIDER_SITE_OTHER): Payer: BLUE CROSS/BLUE SHIELD | Admitting: Family Medicine

## 2016-09-25 ENCOUNTER — Encounter: Payer: Self-pay | Admitting: Family Medicine

## 2016-09-25 DIAGNOSIS — J441 Chronic obstructive pulmonary disease with (acute) exacerbation: Secondary | ICD-10-CM

## 2016-09-25 MED ORDER — HYDROCODONE-HOMATROPINE 5-1.5 MG/5ML PO SYRP: 5.0000 mL | ORAL_SOLUTION | Freq: Three times a day (TID) | ORAL | 0 refills | Status: DC | PRN

## 2016-09-25 MED ORDER — PREDNISONE 20 MG PO TABS: 40.0000 mg | ORAL_TABLET | Freq: Every day | ORAL | 0 refills | Status: AC

## 2016-09-25 NOTE — Progress Notes (Signed)
Patient: Gabrielle Pineda Female    DOB: 18-Jul-1956   61 y.o.   MRN: [id number] Visit Date: 09/25/2016  Today's Provider: Lelon Huh, MD   Chief Complaint  Patient presents with  . Follow-up    COPD exacerbation   Subjective:    HPI Pt is here today for a 2 day follow up per Adriana. Pt was seen on 09/23/16 for COPD exacerbation administered Nebulizer in the office. Was prescribed Z-pak and prednisone. Pt advised to use albuterol inhaler every 6 hours. Pt reports that she is feeling about 70% better. She still has sweats, shortness of breath and cough. She has also started having a headache across her forehead that starts in the evening and last until she goes to bed, nothing seems to help this.     Allergies  Allergen Reactions  . Plaquenil  [Hydroxychloroquine] Rash     Current Outpatient Prescriptions:  .  albuterol (PROVENTIL HFA;VENTOLIN HFA) 108 (90 Base) MCG/ACT inhaler, Inhale 2 puffs into the lungs every 6 (six) hours as needed for wheezing or shortness of breath., Disp: 1 Inhaler, Rfl: 6 .  azithromycin (ZITHROMAX) 250 MG tablet, 2 tabs on day 1, 1 tab on following 4 days, Disp: 6 tablet, Rfl: 0 .  b complex vitamins tablet, Take 1 tablet by mouth daily.  , Disp: , Rfl:  .  FOLIC ACID PO, Take 10 mg tablet twice a day., Disp: , Rfl:  .  guaiFENesin (MUCINEX) 600 MG 12 hr tablet, Take 1,200 mg by mouth 2 (two) times daily., Disp: , Rfl:  .  ibuprofen (ADVIL,MOTRIN) 200 MG tablet, Take 200 mg by mouth every 6 (six) hours as needed. For pain, Disp: , Rfl:  .  meloxicam (MOBIC) 7.5 MG tablet, Take 7.5 mg by mouth daily., Disp: , Rfl:  .  Methotrexate, PF, 25 MG/0.5ML SOAJ, 0.55mL once weekly, Disp: 0.5 mL, Rfl: 11 .  Multiple Vitamin (MULTIVITAMIN) tablet, Take 1 tablet by mouth daily.  , Disp: , Rfl:  .  Nutritional Supplements (ESTROVEN ENERGY PO), Take by mouth daily., Disp: , Rfl:  .  omeprazole (PRILOSEC) 20 MG capsule, Take 20 mg by mouth daily., Disp: , Rfl:    .  predniSONE (DELTASONE) 20 MG tablet, Take 2 tablets (40 mg total) by mouth daily with breakfast., Disp: 10 tablet, Rfl: 0 .  sertraline (ZOLOFT) 100 MG tablet, TAKE ONE TABLET BY MOUTH ONCE DAILY, Disp: 90 tablet, Rfl: 1 .  traMADol (ULTRAM) 50 MG tablet, Take by mouth every 6 (six) hours as needed., Disp: , Rfl:  .  traZODone (DESYREL) 50 MG tablet, TAKE ONE TO TWO TABLETS BY MOUTH AT BEDTIME, Disp: 180 tablet, Rfl: 1 .  valACYclovir (VALTREX) 1000 MG tablet, Take 2 tablets (2,000 mg total) by mouth 2 (two) times daily. FOR 5 DAYS AS NEEDED, Disp: 40 tablet, Rfl: 3 .  Venlafaxine HCl 75 MG TB24, Take 1 tablet (75 mg total) by mouth 3 (three) times daily., Disp: 270 each, Rfl: 1 .  atorvastatin (LIPITOR) 40 MG tablet, Take 1 tablet (40 mg total) by mouth daily., Disp: 90 tablet, Rfl: 3  Current Facility-Administered Medications:  .  ipratropium-albuterol (DUONEB) 0.5-2.5 (3) MG/3ML nebulizer solution 3 mL, 3 mL, Nebulization, Once, Adriana M Pollak, PA-C  Review of Systems  Constitutional: Positive for diaphoresis and fatigue.  HENT: Positive for congestion.   Eyes: Negative.   Respiratory: Positive for cough and shortness of breath.   Cardiovascular: Negative.   Gastrointestinal:  Negative.   Endocrine: Negative.   Genitourinary: Negative.   Musculoskeletal: Negative.   Skin: Negative.   Allergic/Immunologic: Negative.   Neurological: Positive for headaches.  Hematological: Negative.   Psychiatric/Behavioral: Negative.     Social History  Substance Use Topics  . Smoking status: Current Every Day Smoker    Packs/day: 0.50    Years: 40.00  . Smokeless tobacco: Never Used  . Alcohol use No     Comment: recovering alcoholic Since [id number]    Objective:   BP 128/72 (BP Location: Left Arm, Patient Position: Sitting, Cuff Size: Normal)   Pulse 82   Temp 97.8 F (36.6 C) (Oral)   Resp 18   Wt 140 lb (63.5 kg)   SpO2 94%   BMI 20.67 kg/m   Physical Exam  General Appearance:     Alert, cooperative, no distress  HENT:   bilateral TM normal without fluid or infection, neck without nodes, sinuses nontender and nasal mucosa congested  Eyes:    PERRL, conjunctiva/corneas clear, EOM's intact       Lungs:     Occasional expiratory wheeze, no rales, , respirations unlabored  Heart:    Regular rate and rhythm  Neurologic:   Awake, alert, oriented x 3. No apparent focal neurological           defect.           Assessment & Plan:     1. COPD exacerbation ([place]) Improved since starting azithromycin, but is wheezing and cough is disrupting sleep.  - HYDROcodone-homatropine (HYCODAN) 5-1.5 MG/5ML syrup; Take 5 mLs by mouth every 8 (eight) hours as needed for cough.  Dispense: 120 mL; Refill: 0 - predniSONE (DELTASONE) 20 MG tablet; Take 2 tablets (40 mg total) by mouth daily with breakfast.  Dispense: 10 tablet; Refill: 0       Lelon Huh, MD  [facility]

## 2016-10-15 ENCOUNTER — Other Ambulatory Visit: Payer: Self-pay | Admitting: Neurosurgery

## 2016-10-15 DIAGNOSIS — M5416 Radiculopathy, lumbar region: Secondary | ICD-10-CM

## 2016-10-15 DIAGNOSIS — M5412 Radiculopathy, cervical region: Secondary | ICD-10-CM

## 2016-10-20 ENCOUNTER — Other Ambulatory Visit: Payer: Self-pay | Admitting: Physician Assistant

## 2016-10-22 ENCOUNTER — Ambulatory Visit
Admission: RE | Admit: 2016-10-22 | Discharge: 2016-10-22 | Disposition: A | Discharge status: Home / Self Care | Payer: BLUE CROSS/BLUE SHIELD | Source: Ambulatory Visit | Attending: Neurosurgery | Admitting: Neurosurgery

## 2016-10-22 DIAGNOSIS — M5416 Radiculopathy, lumbar region: Secondary | ICD-10-CM

## 2016-10-22 DIAGNOSIS — M5412 Radiculopathy, cervical region: Secondary | ICD-10-CM

## 2016-10-22 MED ORDER — IOPAMIDOL (ISOVUE-M 300) INJECTION 61%
10.0000 mL | Freq: Once | INTRAMUSCULAR | Status: AC | PRN
  Administered 2016-10-22: 10 mL via INTRATHECAL

## 2016-10-22 MED ORDER — HYDROXYZINE HCL 50 MG/ML IM SOLN
25.0000 mg | Freq: Once | INTRAMUSCULAR | Status: AC
  Administered 2016-10-22: 25 mg via INTRAMUSCULAR

## 2016-10-22 MED ORDER — MEPERIDINE HCL 100 MG/ML IJ SOLN
75.0000 mg | Freq: Once | INTRAMUSCULAR | Status: AC
  Administered 2016-10-22: 75 mg via INTRAMUSCULAR

## 2016-10-22 MED ORDER — DIAZEPAM 5 MG PO TABS
10.0000 mg | ORAL_TABLET | Freq: Once | ORAL | Status: AC
  Administered 2016-10-22: 10 mg via ORAL

## 2016-10-22 NOTE — Discharge Instructions (Signed)
Myelogram Discharge Instructions  1. Go home and rest quietly for the next 24 hours.  It is important to lie flat for the next 24 hours.  Get up only to go to the restroom.  You may lie in the bed or on a couch on your back, your stomach, your left side or your right side.  You may have one pillow under your head.  You may have pillows between your knees while you are on your side or under your knees while you are on your back.  2. DO NOT drive today.  Recline the seat as far back as it will go, while still wearing your seat belt, on the way home.  3. You may get up to go to the bathroom as needed.  You may sit up for 10 minutes to eat.  You may resume your normal diet and medications unless otherwise indicated.  Drink plenty of extra fluids today and tomorrow.  4. The incidence of a spinal headache with nausea and/or vomiting is about 5% (one in 20 patients).  If you develop a headache, lie flat and drink plenty of fluids until the headache goes away.  Caffeinated beverages may be helpful.  If you develop severe nausea and vomiting or a headache that does not go away with flat bed rest, call [phone].  5. You may resume normal activities after your 24 hours of bed rest is over; however, do not exert yourself strongly or do any heavy lifting tomorrow.  6. Call your physician for a follow-up appointment.    You may resume Sertraline, Trazodone and Venlafaxine on Friday, October 23, 2016 after 1:00p.m.

## 2016-11-16 ENCOUNTER — Other Ambulatory Visit: Payer: Self-pay | Admitting: Physician Assistant

## 2016-11-16 DIAGNOSIS — F3341 Major depressive disorder, recurrent, in partial remission: Secondary | ICD-10-CM

## 2016-12-15 ENCOUNTER — Other Ambulatory Visit: Payer: Self-pay | Admitting: Physician Assistant

## 2016-12-15 DIAGNOSIS — G4700 Insomnia, unspecified: Secondary | ICD-10-CM

## 2016-12-30 ENCOUNTER — Other Ambulatory Visit: Payer: Self-pay | Admitting: Family Medicine

## 2017-02-12 ENCOUNTER — Telehealth: Payer: Self-pay | Admitting: Cardiovascular Disease

## 2017-02-12 NOTE — Telephone Encounter (Signed)
Received records request Disability Determination Services, forwarded to [employer] for processing.  

## 2017-04-09 ENCOUNTER — Ambulatory Visit (INDEPENDENT_AMBULATORY_CARE_PROVIDER_SITE_OTHER): Payer: BLUE CROSS/BLUE SHIELD | Admitting: Physician Assistant

## 2017-04-09 ENCOUNTER — Encounter: Payer: Self-pay | Admitting: Physician Assistant

## 2017-04-09 ENCOUNTER — Encounter: Payer: Self-pay | Admitting: Family Medicine

## 2017-04-09 VITALS — BP 130/80 | HR 78 | Temp 98.2°F | Resp 16 | Ht 69.0 in | Wt 145.8 lb

## 2017-04-09 DIAGNOSIS — Z833 Family history of diabetes mellitus: Secondary | ICD-10-CM

## 2017-04-09 DIAGNOSIS — F331 Major depressive disorder, recurrent, moderate: Secondary | ICD-10-CM

## 2017-04-09 DIAGNOSIS — M069 Rheumatoid arthritis, unspecified: Secondary | ICD-10-CM | POA: Diagnosis not present

## 2017-04-09 DIAGNOSIS — J418 Mixed simple and mucopurulent chronic bronchitis: Secondary | ICD-10-CM

## 2017-04-09 DIAGNOSIS — Z1239 Encounter for other screening for malignant neoplasm of breast: Secondary | ICD-10-CM

## 2017-04-09 DIAGNOSIS — E7800 Pure hypercholesterolemia, unspecified: Secondary | ICD-10-CM | POA: Diagnosis not present

## 2017-04-09 DIAGNOSIS — I471 Supraventricular tachycardia: Secondary | ICD-10-CM | POA: Diagnosis not present

## 2017-04-09 DIAGNOSIS — Z0000 Encounter for general adult medical examination without abnormal findings: Secondary | ICD-10-CM | POA: Diagnosis not present

## 2017-04-09 DIAGNOSIS — G4701 Insomnia due to medical condition: Secondary | ICD-10-CM | POA: Diagnosis not present

## 2017-04-09 DIAGNOSIS — Z1231 Encounter for screening mammogram for malignant neoplasm of breast: Secondary | ICD-10-CM

## 2017-04-09 DIAGNOSIS — Z1211 Encounter for screening for malignant neoplasm of colon: Secondary | ICD-10-CM | POA: Diagnosis not present

## 2017-04-09 NOTE — Progress Notes (Signed)
Patient: Gabrielle Pineda, Female    DOB: July 08, 1956, 61 y.o.   MRN: [id number] Visit Date: 04/09/2017  Today's Provider: [name], PA-C   Chief Complaint  Patient presents with  . Annual Exam   Subjective:    Annual physical exam Gabrielle Pineda is a 61 y.o. female who presents today for health maintenance and complete physical. She feels fairly well. She reports exercising daily (walk 60 mins). She reports she is sleeping fairly well.  04/08/16 CPE 04/08/16 Pap-neg; HPV-neg -----------------------------------------------------------------   Review of Systems  Constitutional: Positive for fatigue.       Crying, irritability  HENT: Positive for congestion, ear pain, mouth sores, sinus pressure, sneezing, tinnitus and trouble swallowing.   Eyes: Negative.   Respiratory: Positive for shortness of breath and wheezing.   Cardiovascular: Positive for palpitations.  Gastrointestinal: Positive for abdominal distention and abdominal pain.  Endocrine: Positive for polydipsia.  Genitourinary: Positive for difficulty urinating.  Musculoskeletal: Positive for arthralgias, back pain, joint swelling, myalgias, neck pain and neck stiffness.  Skin: Negative.   Allergic/Immunologic: Positive for environmental allergies.  Neurological: Positive for headaches.  Hematological: Negative.   Psychiatric/Behavioral: Positive for decreased concentration and sleep disturbance.    Social History      She  reports that she has been smoking.  She has a 20.00 pack-year smoking history. She has never used smokeless tobacco. She reports that she does not drink alcohol or use drugs.       Social History   Social History  . Marital status: Married    Spouse name: N/A  . Number of children: N/A  . Years of education: N/A   Social History Main Topics  . Smoking status: Current Every Day Smoker    Packs/day: 0.50    Years: 40.00  . Smokeless tobacco: Never Used  . Alcohol use  No     Comment: recovering alcoholic Since 5462   . Drug use: No  . Sexual activity: No   Other Topics Concern  . None   Social History Narrative   Married; full time; does not get regular exercise.     Past Medical History:  Diagnosis Date  . Anemia   . Cervicalgia   . Depression   . GERD (gastroesophageal reflux disease)   . Heart murmur    on heard when pt is lying  . Other and unspecified hyperlipidemia   . Tachycardia    d/t questionable anxiety happens every 5- 10 years, sees [facility]     Patient Active Problem List   Diagnosis Date Noted  . COPD (chronic obstructive pulmonary disease) ([place]) 04/10/2015  . Family history of malignant neoplasm of pancreas 04/03/2015  . Hypercholesteremia 04/03/2015  . Disorder of iron metabolism 04/03/2015  . Premature menopause 04/03/2015  . Weight loss 04/03/2015  . Delayed onset of urination 04/03/2015  . Acid reflux 10/11/2014  . Closed fracture of distal phalanx of thumb 08/15/2014  . Arthritis, degenerative 01/30/2014  . Arthritis or polyarthritis, rheumatoid ([place]) 01/30/2014  . Shortness of breath 12/22/2013  . Other and unspecified hyperlipidemia 09/22/2010  . SMOKER 09/22/2010  . Paroxysmal supraventricular tachycardia ([place]) 09/22/2010  . CHEST PAIN UNSPECIFIED 09/22/2010  . ELECTROCARDIOGRAM, ABNORMAL 09/22/2010  . B-complex deficiency 09/09/2007  . CN (constipation) 06/26/2007  . Clinical depression 06/26/2007  . Cold sore 06/26/2007  . H/O alcohol abuse 06/26/2007  . Cannot sleep 06/26/2007  . Localized osteoarthrosis, hand 06/26/2007  . Menopausal symptom 06/26/2007  .  Fast heart beat 06/26/2007    Past Surgical History:  Procedure Laterality Date  . BREAST ENHANCEMENT SURGERY  1981  . CARPAL TUNNEL RELEASE  11/11/2011   Procedure: CARPAL TUNNEL RELEASE;  Surgeon: Floyce Stakes, MD;  Location: [facility];  Service: Neurosurgery;  Laterality: Right;  Right Median Nerve Decompression  . CARPAL  TUNNEL RELEASE  2013  . ESOPHAGOGASTRODUODENOSCOPY    . neck disc surgery     plates and screws in neck x 2  . ROTATOR CUFF REPAIR  06/16/2016   right shoulder   . TONSILLECTOMY AND ADENOIDECTOMY  1973     Family History        Family Status  Relation Status  . Father Deceased at age 59  . Mother Deceased at age 97       suicide, drug OD   . Neg Hx (Not Specified)        Her family history includes Alcohol abuse in her father and mother; Bipolar disorder in her mother; Pancreatic cancer in her father; Suicidality in her mother.     Allergies  Allergen Reactions  . Plaquenil [Hydroxychloroquine] Rash     Current Outpatient Prescriptions:  .  albuterol (PROVENTIL HFA;VENTOLIN HFA) 108 (90 Base) MCG/ACT inhaler, Inhale 2 puffs into the lungs every 6 (six) hours as needed for wheezing or shortness of breath., Disp: 1 Inhaler, Rfl: 6 .  b complex vitamins tablet, Take 1 tablet by mouth daily. B12, Disp: , Rfl:  .  FOLIC ACID PO, Take 10 mg tablet twice a day., Disp: , Rfl:  .  guaiFENesin (MUCINEX) 600 MG 12 hr tablet, Take 1,200 mg by mouth 2 (two) times daily., Disp: , Rfl:  .  ibuprofen (ADVIL,MOTRIN) 200 MG tablet, Take 200 mg by mouth every 6 (six) hours as needed. For pain, Disp: , Rfl:  .  meloxicam (MOBIC) 7.5 MG tablet, Take 7.5 mg by mouth daily., Disp: , Rfl:  .  Methotrexate, PF, 25 MG/0.5ML SOAJ, 0.51mL once weekly, Disp: 0.5 mL, Rfl: 11 .  Multiple Vitamin (MULTIVITAMIN) tablet, Take 1 tablet by mouth daily.  , Disp: , Rfl:  .  Nutritional Supplements (ESTROVEN ENERGY PO), Take by mouth daily., Disp: , Rfl:  .  omeprazole (PRILOSEC) 20 MG capsule, Take 20 mg by mouth daily., Disp: , Rfl:  .  sertraline (ZOLOFT) 100 MG tablet, TAKE ONE TABLET BY MOUTH ONCE DAILY, Disp: 90 tablet, Rfl: 1 .  traZODone (DESYREL) 50 MG tablet, TAKE ONE TO TWO TABLETS BY MOUTH AT BEDTIME, Disp: 180 tablet, Rfl: 1 .  valACYclovir (VALTREX) 1000 MG tablet, TAKE TWO TABLETS BY MOUTH TWICE DAILY  FOR 5 DAYS AS NEEDED, Disp: 40 tablet, Rfl: 3 .  Venlafaxine HCl 75 MG TB24, TAKE ONE TABLET BY MOUTH THREE TIMES DAILY, Disp: 270 tablet, Rfl: 1 .  atorvastatin (LIPITOR) 40 MG tablet, Take 1 tablet (40 mg total) by mouth daily., Disp: 90 tablet, Rfl: 3  Current Facility-Administered Medications:  .  ipratropium-albuterol (DUONEB) 0.5-2.5 (3) MG/3ML nebulizer solution 3 mL, 3 mL, Nebulization, Once, Terrilee Croak, Adriana M, PA-C   Patient Care Team: Birdie Sons, MD as PCP - General (Family Medicine)      Objective:   Vitals: BP 130/80 (BP Location: Left Arm, Patient Position: Sitting, Cuff Size: Large)   Pulse 78   Temp 98.2 F (36.8 C) (Oral)   Resp 16   Ht 5\' 9"  (1.753 m)   Wt 145 lb 12.8 oz (66.1 kg)  SpO2 98%   BMI 21.53 kg/m    Vitals:   04/09/17 1416  BP: 130/80  Pulse: 78  Resp: 16  Temp: 98.2 F (36.8 C)  TempSrc: Oral  SpO2: 98%  Weight: 145 lb 12.8 oz (66.1 kg)  Height: 5\' 9"  (1.753 m)     Physical Exam  Constitutional: She is oriented to person, place, and time. She appears well-developed and well-nourished. No distress.  HENT:  Head: Normocephalic and atraumatic.  Right Ear: Hearing, tympanic membrane, external ear and ear canal normal.  Left Ear: Hearing, tympanic membrane, external ear and ear canal normal.  Nose: Nose normal.  Mouth/Throat: Uvula is midline, oropharynx is clear and moist and mucous membranes are normal. No oropharyngeal exudate.  Eyes: Pupils are equal, round, and reactive to light. Conjunctivae and EOM are normal. Right eye exhibits no discharge. Left eye exhibits no discharge. No scleral icterus.  Neck: Normal range of motion. Neck supple. No JVD present. No tracheal deviation present. No thyromegaly present.  Cardiovascular: Normal rate, regular rhythm, normal heart sounds and intact distal pulses.  Exam reveals no gallop and no friction rub.   No murmur heard. Pulmonary/Chest: Effort normal and breath sounds normal. No respiratory  distress. She has no wheezes. She has no rales. She exhibits no tenderness. Right breast exhibits no inverted nipple, no mass, no nipple discharge, no skin change and no tenderness. Left breast exhibits no inverted nipple, no mass, no nipple discharge, no skin change and no tenderness. Breasts are symmetrical.  Implants bilaterally  Abdominal: Soft. Bowel sounds are normal. She exhibits no distension and no mass. There is no tenderness. There is no rebound and no guarding.  Musculoskeletal: Normal range of motion. She exhibits no edema or tenderness.  Lymphadenopathy:    She has no cervical adenopathy.  Neurological: She is alert and oriented to person, place, and time.  Skin: Skin is warm and dry. No rash noted. She is not diaphoretic.  Psychiatric: She has a normal mood and affect. Her behavior is normal. Judgment and thought content normal.  Vitals reviewed.    Depression Screen PHQ 2/9 Scores 04/09/2017  PHQ - 2 Score 4  PHQ- 9 Score 15      Assessment & Plan:     Routine Health Maintenance and Physical Exam  Exercise Activities and Dietary recommendations Goals    None      Immunization History  Administered Date(s) Administered  . Influenza,inj,Quad PF,6+ Mos 11/18/2015, 07/23/2016  . Influenza-Unspecified 09/24/2014  . Pneumococcal Polysaccharide-23 09/24/2014  . Tdap 11/15/2015    Health Maintenance  Topic Date Due  . HIV Screening  08/10/1971  . MAMMOGRAM  08/09/2006  . INFLUENZA VACCINE  03/17/2017  . COLONOSCOPY  09/17/2018  . PAP SMEAR  04/09/2019  . TETANUS/TDAP  11/14/2025  . Hepatitis C Screening  Completed     Discussed health benefits of physical activity, and encouraged her to engage in regular exercise appropriate for her age and condition.    1. Annual physical exam Normal physical exam today. Will check labs as below and f/u pending lab results. If labs are stable and WNL she will not need to have these rechecked for one year at her next  annual physical exam. She is to call the office in the meantime if she has any acute issue, questions or concerns. - CBC with Differential/Platelet - Comprehensive metabolic panel - Hemoglobin A1c - TSH  2. Breast cancer screening Breast exam today was normal. There is no family history  of breast cancer. She does perform regular self breast exams. Mammogram was ordered as below. Information for [facility] clinic was given to patient so she may schedule her mammogram at her convenience. - MM DIGITAL SCREENING BILATERAL; Future  3. Colon cancer screening Due for repeat colonoscopy. Referral placed as below. - Ambulatory referral to Gastroenterology  4. Family history of diabetes mellitus (DM) Will check labs as below and f/u pending results. - Hemoglobin A1c  5. Hypercholesterolemia Stable on Atorvastatin 40mg . Will check labs as below and f/u pending results. - CBC with Differential/Platelet - Comprehensive metabolic panel - Lipid Panel With LDL/HDL Ratio  6. Mixed simple and mucopurulent chronic bronchitis (HCC) Currently stable. Continue Albuterol prn.  7. Moderate episode of recurrent major depressive disorder ([place]) Exacerbated currently. Discussed coping and counseling. She feels she is maintaining ok at this time and has her niece with whom she feels close to and confident to share with. She has made a pact with her niece to not commit suicide. She reports she has thoughts some days about death but would never act and has no plan. She reports she has lived through her mother's suicide and would not want that for anyone. She reports she will reach out if symptoms worsen. Continue venlafaxine, sertraline and trazodone.   8. Insomnia due to medical condition Patient has trouble sleeping secondary to arthritic pains. She is using trazodone 100mg  q hs prn. She does not use nightly.   9. Paroxysmal supraventricular tachycardia (HCC) Stable.   10. Rheumatoid arthritis involving  multiple sites, unspecified rheumatoid factor presence ([place]) Followed by rheumatology.  --------------------------------------------------------------------    [name], PA-C  [facility]

## 2017-04-09 NOTE — Patient Instructions (Signed)
Health Maintenance for Postmenopausal Women Menopause is a normal process in which your reproductive ability comes to an end. This process happens gradually over a span of months to years, usually between the ages of 22 and 9. Menopause is complete when you have missed 12 consecutive menstrual periods. It is important to talk with your health care provider about some of the most common conditions that affect postmenopausal women, such as heart disease, cancer, and bone loss (osteoporosis). Adopting a healthy lifestyle and getting preventive care can help to promote your health and wellness. Those actions can also lower your chances of developing some of these common conditions. What should I know about menopause? During menopause, you may experience a number of symptoms, such as:  Moderate-to-severe hot flashes.  Night sweats.  Decrease in sex drive.  Mood swings.  Headaches.  Tiredness.  Irritability.  Memory problems.  Insomnia.  Choosing to treat or not to treat menopausal changes is an individual decision that you make with your health care provider. What should I know about hormone replacement therapy and supplements? Hormone therapy products are effective for treating symptoms that are associated with menopause, such as hot flashes and night sweats. Hormone replacement carries certain risks, especially as you become older. If you are thinking about using estrogen or estrogen with progestin treatments, discuss the benefits and risks with your health care provider. What should I know about heart disease and stroke? Heart disease, heart attack, and stroke become more likely as you age. This may be due, in part, to the hormonal changes that your body experiences during menopause. These can affect how your body processes dietary fats, triglycerides, and cholesterol. Heart attack and stroke are both medical emergencies. There are many things that you can do to help prevent heart disease  and stroke:  Have your blood pressure checked at least every 1-2 years. High blood pressure causes heart disease and increases the risk of stroke.  If you are 53-22 years old, ask your health care provider if you should take aspirin to prevent a heart attack or a stroke.  Do not use any tobacco products, including cigarettes, chewing tobacco, or electronic cigarettes. If you need help quitting, ask your health care provider.  It is important to eat a healthy diet and maintain a healthy weight. ? Be sure to include plenty of vegetables, fruits, low-fat dairy products, and lean protein. ? Avoid eating foods that are high in solid fats, added sugars, or salt (sodium).  Get regular exercise. This is one of the most important things that you can do for your health. ? Try to exercise for at least 150 minutes each week. The type of exercise that you do should increase your heart rate and make you sweat. This is known as moderate-intensity exercise. ? Try to do strengthening exercises at least twice each week. Do these in addition to the moderate-intensity exercise.  Know your numbers.Ask your health care provider to check your cholesterol and your blood glucose. Continue to have your blood tested as directed by your health care provider.  What should I know about cancer screening? There are several types of cancer. Take the following steps to reduce your risk and to catch any cancer development as early as possible. Breast Cancer  Practice breast self-awareness. ? This means understanding how your breasts normally appear and feel. ? It also means doing regular breast self-exams. Let your health care provider know about any changes, no matter how small.  If you are 40  or older, have a clinician do a breast exam (clinical breast exam or CBE) every year. Depending on your age, family history, and medical history, it may be recommended that you also have a yearly breast X-ray (mammogram).  If you  have a family history of breast cancer, talk with your health care provider about genetic screening.  If you are at high risk for breast cancer, talk with your health care provider about having an MRI and a mammogram every year.  Breast cancer (BRCA) gene test is recommended for women who have family members with BRCA-related cancers. Results of the assessment will determine the need for genetic counseling and BRCA1 and for BRCA2 testing. BRCA-related cancers include these types: ? Breast. This occurs in males or females. ? Ovarian. ? Tubal. This may also be called fallopian tube cancer. ? Cancer of the abdominal or pelvic lining (peritoneal cancer). ? Prostate. ? Pancreatic.  Cervical, Uterine, and Ovarian Cancer Your health care provider may recommend that you be screened regularly for cancer of the pelvic organs. These include your ovaries, uterus, and vagina. This screening involves a pelvic exam, which includes checking for microscopic changes to the surface of your cervix (Pap test).  For women ages 21-65, health care providers may recommend a pelvic exam and a Pap test every three years. For women ages 79-65, they may recommend the Pap test and pelvic exam, combined with testing for human papilloma virus (HPV), every five years. Some types of HPV increase your risk of cervical cancer. Testing for HPV may also be done on women of any age who have unclear Pap test results.  Other health care providers may not recommend any screening for nonpregnant women who are considered low risk for pelvic cancer and have no symptoms. Ask your health care provider if a screening pelvic exam is right for you.  If you have had past treatment for cervical cancer or a condition that could lead to cancer, you need Pap tests and screening for cancer for at least 20 years after your treatment. If Pap tests have been discontinued for you, your risk factors (such as having a new sexual partner) need to be  reassessed to determine if you should start having screenings again. Some women have medical problems that increase the chance of getting cervical cancer. In these cases, your health care provider may recommend that you have screening and Pap tests more often.  If you have a family history of uterine cancer or ovarian cancer, talk with your health care provider about genetic screening.  If you have vaginal bleeding after reaching menopause, tell your health care provider.  There are currently no reliable tests available to screen for ovarian cancer.  Lung Cancer Lung cancer screening is recommended for adults 69-62 years old who are at high risk for lung cancer because of a history of smoking. A yearly low-dose CT scan of the lungs is recommended if you:  Currently smoke.  Have a history of at least 30 pack-years of smoking and you currently smoke or have quit within the past 15 years. A pack-year is smoking an average of one pack of cigarettes per day for one year.  Yearly screening should:  Continue until it has been 15 years since you quit.  Stop if you develop a health problem that would prevent you from having lung cancer treatment.  Colorectal Cancer  This type of cancer can be detected and can often be prevented.  Routine colorectal cancer screening usually begins at  age 42 and continues through age 45.  If you have risk factors for colon cancer, your health care provider may recommend that you be screened at an earlier age.  If you have a family history of colorectal cancer, talk with your health care provider about genetic screening.  Your health care provider may also recommend using home test kits to check for hidden blood in your stool.  A small camera at the end of a tube can be used to examine your colon directly (sigmoidoscopy or colonoscopy). This is done to check for the earliest forms of colorectal cancer.  Direct examination of the colon should be repeated every  5-10 years until age 71. However, if early forms of precancerous polyps or small growths are found or if you have a family history or genetic risk for colorectal cancer, you may need to be screened more often.  Skin Cancer  Check your skin from head to toe regularly.  Monitor any moles. Be sure to tell your health care provider: ? About any new moles or changes in moles, especially if there is a change in a mole's shape or color. ? If you have a mole that is larger than the size of a pencil eraser.  If any of your family members has a history of skin cancer, especially at a young age, talk with your health care provider about genetic screening.  Always use sunscreen. Apply sunscreen liberally and repeatedly throughout the day.  Whenever you are outside, protect yourself by wearing long sleeves, pants, a wide-brimmed hat, and sunglasses.  What should I know about osteoporosis? Osteoporosis is a condition in which bone destruction happens more quickly than new bone creation. After menopause, you may be at an increased risk for osteoporosis. To help prevent osteoporosis or the bone fractures that can happen because of osteoporosis, the following is recommended:  If you are 46-71 years old, get at least 1,000 mg of calcium and at least 600 mg of vitamin D per day.  If you are older than age 55 but younger than age 65, get at least 1,200 mg of calcium and at least 600 mg of vitamin D per day.  If you are older than age 54, get at least 1,200 mg of calcium and at least 800 mg of vitamin D per day.  Smoking and excessive alcohol intake increase the risk of osteoporosis. Eat foods that are rich in calcium and vitamin D, and do weight-bearing exercises several times each week as directed by your health care provider. What should I know about how menopause affects my mental health? Depression may occur at any age, but it is more common as you become older. Common symptoms of depression  include:  Low or sad mood.  Changes in sleep patterns.  Changes in appetite or eating patterns.  Feeling an overall lack of motivation or enjoyment of activities that you previously enjoyed.  Frequent crying spells.  Talk with your health care provider if you think that you are experiencing depression. What should I know about immunizations? It is important that you get and maintain your immunizations. These include:  Tetanus, diphtheria, and pertussis (Tdap) booster vaccine.  Influenza every year before the flu season begins.  Pneumonia vaccine.  Shingles vaccine.  Your health care provider may also recommend other immunizations. This information is not intended to replace advice given to you by your health care provider. Make sure you discuss any questions you have with your health care provider. Document Released: 09/25/2005  Document Revised: 02/21/2016 Document Reviewed: 05/07/2015 Elsevier Interactive Patient Education  2018 [employer]  

## 2017-04-17 LAB — COMPREHENSIVE METABOLIC PANEL
ALBUMIN: 4.3 g/dL (ref 3.6–4.8)
ALK PHOS: 99 IU/L (ref 39–117)
ALT: 12 IU/L (ref 0–32)
AST: 18 IU/L (ref 0–40)
Albumin/Globulin Ratio: 2 (ref 1.2–2.2)
BILIRUBIN TOTAL: 0.3 mg/dL (ref 0.0–1.2)
BUN / CREAT RATIO: 14 (ref 12–28)
BUN: 10 mg/dL (ref 8–27)
CHLORIDE: 110 mmol/L — AB (ref 96–106)
CO2: 21 mmol/L (ref 20–29)
Calcium: 9.3 mg/dL (ref 8.7–10.3)
Creatinine, Ser: 0.73 mg/dL (ref 0.57–1.00)
GFR calc Af Amer: 104 mL/min/{1.73_m2} (ref >59)
GFR calc non Af Amer: 90 mL/min/{1.73_m2} (ref >59)
GLOBULIN, TOTAL: 2.2 g/dL (ref 1.5–4.5)
GLUCOSE: 89 mg/dL (ref 65–99)
Potassium: 3.8 mmol/L (ref 3.5–5.2)
SODIUM: 144 mmol/L (ref 134–144)
Total Protein: 6.5 g/dL (ref 6.0–8.5)

## 2017-04-17 LAB — CBC WITH DIFFERENTIAL/PLATELET
BASOS ABS: 0 10*3/uL (ref 0.0–0.2)
Basos: 0 %
EOS (ABSOLUTE): 0.1 10*3/uL (ref 0.0–0.4)
Eos: 3 %
HEMATOCRIT: 35.1 % (ref 34.0–46.6)
HEMOGLOBIN: 12 g/dL (ref 11.1–15.9)
Immature Grans (Abs): 0 10*3/uL (ref 0.0–0.1)
Immature Granulocytes: 0 %
LYMPHS ABS: 1.6 10*3/uL (ref 0.7–3.1)
Lymphs: 37 %
MCH: 35.3 pg — AB (ref 26.6–33.0)
MCHC: 34.2 g/dL (ref 31.5–35.7)
MCV: 103 fL — ABNORMAL HIGH (ref 79–97)
MONOCYTES: 5 %
MONOS ABS: 0.2 10*3/uL (ref 0.1–0.9)
NEUTROS ABS: 2.4 10*3/uL (ref 1.4–7.0)
Neutrophils: 55 %
Platelets: 297 10*3/uL (ref 150–379)
RBC: 3.4 x10E6/uL — ABNORMAL LOW (ref 3.77–5.28)
RDW: 14.8 % (ref 12.3–15.4)
WBC: 4.4 10*3/uL (ref 3.4–10.8)

## 2017-04-17 LAB — LIPID PANEL WITH LDL/HDL RATIO
Cholesterol, Total: 195 mg/dL (ref 100–199)
HDL: 46 mg/dL (ref >39)
LDL Calculated: 121 mg/dL — ABNORMAL HIGH (ref 0–99)
LDL/HDL RATIO: 2.6 ratio (ref 0.0–3.2)
Triglycerides: 138 mg/dL (ref 0–149)
VLDL Cholesterol Cal: 28 mg/dL (ref 5–40)

## 2017-04-17 LAB — HEMOGLOBIN A1C
Est. average glucose Bld gHb Est-mCnc: 103 mg/dL
HEMOGLOBIN A1C: 5.2 % (ref 4.8–5.6)

## 2017-04-17 LAB — TSH: TSH: 2.16 u[IU]/mL (ref 0.450–4.500)

## 2017-04-20 ENCOUNTER — Telehealth: Payer: Self-pay

## 2017-04-20 NOTE — Telephone Encounter (Signed)
-----   Message from [name], [place] sent at 04/20/2017  8:24 AM EDT ----- All labs are within normal limits and/or stable.  Thanks! -JB

## 2017-04-20 NOTE — Telephone Encounter (Signed)
Patient advised as directed below.  Thanks,  -Marlys Stegmaier 

## 2017-05-03 ENCOUNTER — Other Ambulatory Visit: Payer: Self-pay | Admitting: Physician Assistant

## 2017-05-03 DIAGNOSIS — F3341 Major depressive disorder, recurrent, in partial remission: Secondary | ICD-10-CM

## 2017-05-04 NOTE — Telephone Encounter (Signed)
Please review for [employer], RMA

## 2017-05-05 ENCOUNTER — Encounter: Payer: Self-pay | Admitting: Family Medicine

## 2017-05-30 ENCOUNTER — Other Ambulatory Visit: Payer: Self-pay | Admitting: Physician Assistant

## 2017-05-30 DIAGNOSIS — G4700 Insomnia, unspecified: Secondary | ICD-10-CM

## 2017-06-01 ENCOUNTER — Encounter: Payer: Self-pay | Admitting: Physician Assistant

## 2017-07-06 ENCOUNTER — Other Ambulatory Visit: Payer: Self-pay | Admitting: Physician Assistant

## 2017-07-07 ENCOUNTER — Other Ambulatory Visit: Payer: Self-pay | Admitting: Internal Medicine

## 2017-07-07 ENCOUNTER — Other Ambulatory Visit: Payer: Self-pay

## 2017-07-07 ENCOUNTER — Encounter: Payer: Self-pay | Admitting: Physician Assistant

## 2017-07-07 ENCOUNTER — Ambulatory Visit (INDEPENDENT_AMBULATORY_CARE_PROVIDER_SITE_OTHER): Payer: BLUE CROSS/BLUE SHIELD | Admitting: Physician Assistant

## 2017-07-07 VITALS — BP 140/82 | HR 87 | Temp 98.7°F | Resp 16 | Wt 149.8 lb

## 2017-07-07 DIAGNOSIS — Z87898 Personal history of other specified conditions: Secondary | ICD-10-CM | POA: Diagnosis not present

## 2017-07-07 DIAGNOSIS — F331 Major depressive disorder, recurrent, moderate: Secondary | ICD-10-CM

## 2017-07-07 DIAGNOSIS — M8448XD Pathological fracture, other site, subsequent encounter for fracture with routine healing: Secondary | ICD-10-CM

## 2017-07-07 DIAGNOSIS — S32050A Wedge compression fracture of fifth lumbar vertebra, initial encounter for closed fracture: Secondary | ICD-10-CM | POA: Insufficient documentation

## 2017-07-07 DIAGNOSIS — J418 Mixed simple and mucopurulent chronic bronchitis: Secondary | ICD-10-CM

## 2017-07-07 DIAGNOSIS — M150 Primary generalized (osteo)arthritis: Secondary | ICD-10-CM

## 2017-07-07 DIAGNOSIS — S32050D Wedge compression fracture of fifth lumbar vertebra, subsequent encounter for fracture with routine healing: Secondary | ICD-10-CM

## 2017-07-07 DIAGNOSIS — M069 Rheumatoid arthritis, unspecified: Secondary | ICD-10-CM | POA: Diagnosis not present

## 2017-07-07 DIAGNOSIS — M159 Polyosteoarthritis, unspecified: Secondary | ICD-10-CM

## 2017-07-07 DIAGNOSIS — J449 Chronic obstructive pulmonary disease, unspecified: Secondary | ICD-10-CM

## 2017-07-07 DIAGNOSIS — F1011 Alcohol abuse, in remission: Secondary | ICD-10-CM

## 2017-07-07 MED ORDER — ALBUTEROL SULFATE HFA 108 (90 BASE) MCG/ACT IN AERS: 2.0000 | INHALATION_SPRAY | Freq: Four times a day (QID) | RESPIRATORY_TRACT | 6 refills | Status: DC | PRN

## 2017-07-07 MED ORDER — CYCLOBENZAPRINE HCL 10 MG PO TABS: 10.0000 mg | ORAL_TABLET | Freq: Three times a day (TID) | ORAL | 0 refills | Status: DC | PRN

## 2017-07-07 MED ORDER — GABAPENTIN 300 MG PO CAPS: ORAL_CAPSULE | ORAL | 0 refills | Status: DC

## 2017-07-07 MED ORDER — OXYCODONE HCL 5 MG PO TABS: ORAL_TABLET | ORAL | 0 refills | Status: DC

## 2017-07-07 NOTE — Addendum Note (Signed)
Addended by: [name] on: 07/07/2017 11:58 AM   Modules accepted: Level of Service

## 2017-07-07 NOTE — Patient Instructions (Signed)
Spinal Compression Fracture A spinal compression fracture is a collapse of the bones that form the spine (vertebrae). With this type of fracture, the vertebrae become squashed (compressed) into a wedge shape. Most compression fractures happen in the middle or lower part of the spine. What are the causes? This condition may be caused by:  Thinning and loss of density in the bones (osteoporosis). This is the most common cause.  A fall.  A car or motorcycle accident.  Cancer.  Trauma, such as a heavy, direct hit to the head.  What increases the risk? You may be at greater risk for a spinal compression fracture if you:  Are 31 years old or older.  Have osteoporosis.  Have certain types of cancer, including: ? Multiple myeloma. ? Lymphoma. ? Prostate cancer. ? Lung cancer. ? Breast cancer.  What are the signs or symptoms? Symptoms of this condition include:  Severe pain.  Pain that gets worse over time.  Pain that is worse when you stand, walk, sit, or bend.  Sudden pain that is so bad that it is hard for you to move.  Bending or humping of the spine.  Gradual loss of height.  Numbness, tingling, or weakness in the back and legs.  Trouble walking.  Your symptoms will depend on the cause of the fracture and how quickly it develops. For example, fractures that are caused by osteoporosis can cause few symptoms, no symptoms, or symptoms that develop slowly over time. How is this diagnosed? This condition may be diagnosed based on symptoms, medical history, and a physical exam. During the physical exam, your health care provider may tap along the length of your spine to check for tenderness. Tests may be done to confirm the diagnosis. They may include:  A bone density test to check for osteoporosis.  Imaging tests, such as a spine X-ray, a CT scan, or MRI.  How is this treated? Treatment for this condition depends on the cause and severity of the condition.Some  fractures, such as those that are caused by osteoporosis, may heal on their own with supportive care. This may include:  Pain medicine.  Rest.  A back brace.  Physical therapy exercises.  Medicine that reduces bone pain.  Calcium and vitamin D supplements.  Fractures that cause the back to become misshapen, cause nerve pain or weakness, or do not respond to other treatment may be treated with a surgical procedure, such as:  Vertebroplasty. In this procedure, bone cement is injected into the collapsed vertebrae to stabilize them.  Balloon kyphoplasty. In this procedure, the collapsed vertebrae are expanded with a balloon and then bone cement is injected into them.  Spinal fusion. In this procedure, the collapsed vertebrae are connected (fused) to normal vertebrae.  Follow these instructions at home: General instructions  Take medicines only as directed by your health care provider.  Do not drive or operate heavy machinery while taking pain medicine.  If directed, apply ice to the injured area: ? Put ice in a plastic bag. ? Place a towel between your skin and the bag. ? Leave the ice on for 30 minutes every two hours at first. Then apply the ice as needed.  Wear your neck brace or back brace as directed by your health care provider.  Do not drink alcohol. Alcohol can interfere with your treatment.  Keep all follow-up visits as directed by your health care provider. This is important. It can help to prevent permanent injury, disability, and long-lasting (chronic) pain.  Activity  Stay in bed (on bed rest) only as directed by your health care provider. Being on bed rest for too long can make your condition worse.  Return to your normal activities as directed by your health care provider. Ask what activities are safe for you.  Do exercises to improve motion and strength in your back (physical therapy), as recommended by your health care provider.  Exercise regularly as  directed by your health care provider. Contact a health care provider if:  You have a fever.  You develop a cough that makes your pain worse.  Your pain medicine is not helping.  Your pain does not get better over time.  You cannot return to your normal activities as planned or expected. Get help right away if:  Your pain is very bad and it suddenly gets worse.  You are unable to move any body part (paralysis) that is below the level of your injury.  You have numbness, tingling, or weakness in any body part that is below the level of your injury.  You cannot control your bladder or bowels. This information is not intended to replace advice given to you by your health care provider. Make sure you discuss any questions you have with your health care provider. Document Released: 08/03/2005 Document Revised: 03/31/2016 Document Reviewed: 08/07/2014 Elsevier Interactive Patient Education  [employer].

## 2017-07-07 NOTE — Progress Notes (Signed)
Patient: Gabrielle Pineda Female    DOB: 03/16/1956   61 y.o.   MRN: [id number] Visit Date: 07/07/2017  Today's Provider: [name], PA-C   Chief Complaint  Patient presents with  . Medication Refill   Subjective:    HPI  Depression: Patient here to follow-up Depression. She complains of depressed mood, difficulty concentrating, fatigue, feelings of worthlessness/guilt, hopelessness and insomnia.   She denies current suicidal and homicidal plan or intent.   Family history significant for alcoholism and substance abuse.  Risk factors: positive family history in  mother Previous treatment includes Zoloft and Venlafaxine and Trazadone.    Follow up Hospitalization Patient went to [facility] ED in [facility] for Hip pain. Patient was admitted to  on 06/29/2017 and discharged on 07/02/2017. She was treated for Right Hip pain and back pain. Treatment for this included imaging-Right Sacral ala insufficiency fracture and mild L5 compression Fracture. Patient was also started on Vitamin D due to her increased fractures and history of prednisone use.She also reports that she was given a B12 injection.  She was prescribed/instructed to start taking these medications   cholecalciferol (vitamin D3) 1,000 unit tablet  Take 1 tablet (1,000 Units total) by mouth daily.   cyclobenzaprine 5 MG tablet  Commonly known as: FLEXERIL  Take 1 tablet (5 mg total) by mouth two (2) times a day as needed for muscle spasms.   folic acid 008 MCG tablet  Commonly known as: FOLVITE  Take 1 tablet (400 mcg total) by mouth daily.   oxyCODONE 5 MG immediate release tablet  Commonly known as: ROXICODONE  1-2 tabs every 6 hours as needed   polyethylene glycol 17 gram packet  Commonly known as: MIRALAX  Take 17 g by mouth daily as needed. for up to 3 days  Telephone follow up was done on 07/04/2017 She reports excellent compliance with treatment. She reports this condition is Improved. She  reports that she has a follow-up with a Psychologist, sport and exercise on the [facility] clinic in [place]. Appointment scheduled 07/18/2017 ------------------------------------------------------------------------------------  Patient reports she received the Flu Vaccine at [facility] while she was admitted.     Allergies  Allergen Reactions  . Plaquenil [Hydroxychloroquine] Rash     Current Outpatient Medications:  .  albuterol (PROVENTIL HFA;VENTOLIN HFA) 108 (90 Base) MCG/ACT inhaler, Inhale 2 puffs into the lungs every 6 (six) hours as needed for wheezing or shortness of breath., Disp: 1 Inhaler, Rfl: 6 .  atorvastatin (LIPITOR) 40 MG tablet, TAKE ONE TABLET BY MOUTH ONCE DAILY, Disp: 90 tablet, Rfl: 0 .  b complex vitamins tablet, Take 1 tablet by mouth daily. B12, Disp: , Rfl:  .  FOLIC ACID PO, Take 10 mg tablet twice a day., Disp: , Rfl:  .  guaiFENesin (MUCINEX) 600 MG 12 hr tablet, Take 1,200 mg by mouth 2 (two) times daily., Disp: , Rfl:  .  ibuprofen (ADVIL,MOTRIN) 200 MG tablet, Take 200 mg by mouth every 6 (six) hours as needed. For pain, Disp: , Rfl:  .  Methotrexate, PF, 25 MG/0.5ML SOAJ, 0.44mL once weekly, Disp: 0.5 mL, Rfl: 11 .  Multiple Vitamin (MULTIVITAMIN) tablet, Take 1 tablet by mouth daily.  , Disp: , Rfl:  .  Nutritional Supplements (ESTROVEN ENERGY PO), Take by mouth daily., Disp: , Rfl:  .  omeprazole (PRILOSEC) 20 MG capsule, Take 20 mg by mouth daily., Disp: , Rfl:  .  oxyCODONE (OXY IR/ROXICODONE) 5 MG immediate release tablet, 1-2 tabs  every 6 hours as needed, Disp: , Rfl:  .  sertraline (ZOLOFT) 100 MG tablet, TAKE 1 TABLET BY MOUTH ONCE DAILY, Disp: 90 tablet, Rfl: 1 .  traZODone (DESYREL) 50 MG tablet, TAKE 1 TO 2 TABLETS BY MOUTH AT BEDTIME, Disp: 180 tablet, Rfl: 1 .  valACYclovir (VALTREX) 1000 MG tablet, TAKE TWO TABLETS BY MOUTH TWICE DAILY FOR 5 DAYS AS NEEDED, Disp: 40 tablet, Rfl: 3 .  Venlafaxine HCl 75 MG TB24, TAKE 1 TABLET BY MOUTH THREE TIMES DAILY, Disp: 270  tablet, Rfl: 3 .  meloxicam (MOBIC) 7.5 MG tablet, Take 7.5 mg by mouth daily., Disp: , Rfl:   Current Facility-Administered Medications:  .  ipratropium-albuterol (DUONEB) 0.5-2.5 (3) MG/3ML nebulizer solution 3 mL, 3 mL, Nebulization, Once, Pollak, Adriana M, PA-C  Review of Systems  Constitutional: Positive for activity change. Negative for appetite change and fever.  HENT: Negative.   Respiratory: Negative for cough, chest tightness, shortness of breath and wheezing.   Cardiovascular: Negative for chest pain, palpitations and leg swelling.  Gastrointestinal: Negative for abdominal pain, constipation and diarrhea.  Musculoskeletal: Positive for arthralgias, back pain, gait problem and myalgias. Negative for joint swelling.  Neurological: Positive for weakness (right leg) and numbness (in right leg).  Psychiatric/Behavioral: Positive for agitation, decreased concentration, dysphoric mood and sleep disturbance. Negative for self-injury and suicidal ideas. The patient is nervous/anxious.     Social History   Tobacco Use  . Smoking status: Current Every Day Smoker    Packs/day: 0.50    Years: 40.00    Pack years: 20.00  . Smokeless tobacco: Never Used  Substance Use Topics  . Alcohol use: No    Comment: recovering alcoholic Since [id number]    Objective:   BP 140/82 (BP Location: Left Arm, Patient Position: Sitting, Cuff Size: Normal)   Pulse 87   Temp 98.7 F (37.1 C) (Oral)   Resp 16   Wt 149 lb 12.8 oz (67.9 kg)   SpO2 94%   BMI 22.12 kg/m    Physical Exam  Constitutional: She appears well-developed and well-nourished. No distress.  Patient unable to sit in one position, had to keep sitting then standing.  Neck: Normal range of motion. Neck supple.  Cardiovascular: Normal rate, regular rhythm and normal heart sounds. Exam reveals no gallop and no friction rub.  No murmur heard. Pulmonary/Chest: Effort normal and breath sounds normal. No respiratory distress. She has no  wheezes. She has no rales.  Skin: She is not diaphoretic.  Vitals reviewed.      Assessment & Plan:     1. Moderate episode of recurrent major depressive disorder (HCC) Continue Venlafaxine, zoloft, and trazodone. Will be seeing counseling at facility in [place] as well.   2. Closed compression fracture of L5 lumbar vertebra with routine healing, subsequent encounter Will continue oxycodone as below (initially given in hospital), flexeril and add gabapentin for nerve pain. Had a very long discussion on the risk of these medications and for her to not take at the same time. She voiced understanding. Hope is to control nerve pain more with gabapentin and decrease use of oxycodone over time. Patient in agreement. Discussed addictive capabilities of these medications and with her history of alcohol abuse she is higher risk to develop addiction. She is agreeable to pain management after all treatments are completed at the facility in [place] if pain still hard to control. I will see her back in 3-4 weeks so we can discuss what the plan is from  the treatment facility in [place].  - oxyCODONE (OXY IR/ROXICODONE) 5 MG immediate release tablet; 1-2 tabs every 6 hours as needed  Dispense: 240 tablet; Refill: 0 - cyclobenzaprine (FLEXERIL) 10 MG tablet; Take 1 tablet (10 mg total) by mouth 3 (three) times daily as needed for muscle spasms.  Dispense: 90 tablet; Refill: 0 - gabapentin (NEURONTIN) 300 MG capsule; Start with one capsule PO q hs x 1 week, then increase to 2 capsules PO q hs x 1 week, and if needed may increase to 3 capsules PO q hs  Dispense: 90 capsule; Refill: 0  3. Sacral insufficiency fracture with routine healing See above medical treatment plan. - oxyCODONE (OXY IR/ROXICODONE) 5 MG immediate release tablet; 1-2 tabs every 6 hours as needed  Dispense: 240 tablet; Refill: 0 - cyclobenzaprine (FLEXERIL) 10 MG tablet; Take 1 tablet (10 mg total) by mouth 3 (three) times daily as needed for  muscle spasms.  Dispense: 90 tablet; Refill: 0 - gabapentin (NEURONTIN) 300 MG capsule; Start with one capsule PO q hs x 1 week, then increase to 2 capsules PO q hs x 1 week, and if needed may increase to 3 capsules PO q hs  Dispense: 90 capsule; Refill: 0  4. Rheumatoid arthritis involving multiple sites, unspecified rheumatoid factor presence (Bernie) See above medical treatment plan. - oxyCODONE (OXY IR/ROXICODONE) 5 MG immediate release tablet; 1-2 tabs every 6 hours as needed  Dispense: 240 tablet; Refill: 0 - cyclobenzaprine (FLEXERIL) 10 MG tablet; Take 1 tablet (10 mg total) by mouth 3 (three) times daily as needed for muscle spasms.  Dispense: 90 tablet; Refill: 0 - gabapentin (NEURONTIN) 300 MG capsule; Start with one capsule PO q hs x 1 week, then increase to 2 capsules PO q hs x 1 week, and if needed may increase to 3 capsules PO q hs  Dispense: 90 capsule; Refill: 0  5. Primary osteoarthritis involving multiple joints See above medical treatment plan. - oxyCODONE (OXY IR/ROXICODONE) 5 MG immediate release tablet; 1-2 tabs every 6 hours as needed  Dispense: 240 tablet; Refill: 0 - cyclobenzaprine (FLEXERIL) 10 MG tablet; Take 1 tablet (10 mg total) by mouth 3 (three) times daily as needed for muscle spasms.  Dispense: 90 tablet; Refill: 0 - gabapentin (NEURONTIN) 300 MG capsule; Start with one capsule PO q hs x 1 week, then increase to 2 capsules PO q hs x 1 week, and if needed may increase to 3 capsules PO q hs  Dispense: 90 capsule; Refill: 0  6. H/O alcohol abuse See above medical treatment plan.       [name], PA-C  [facility]

## 2017-07-22 ENCOUNTER — Encounter: Payer: Self-pay | Admitting: Physician Assistant

## 2017-07-22 DIAGNOSIS — J0140 Acute pansinusitis, unspecified: Secondary | ICD-10-CM

## 2017-07-23 ENCOUNTER — Other Ambulatory Visit: Payer: Self-pay | Admitting: Physician Assistant

## 2017-07-23 DIAGNOSIS — J0140 Acute pansinusitis, unspecified: Secondary | ICD-10-CM

## 2017-07-23 MED ORDER — DOXYCYCLINE HYCLATE 100 MG PO TABS: 100.0000 mg | ORAL_TABLET | Freq: Two times a day (BID) | ORAL | 0 refills | Status: DC

## 2017-07-23 NOTE — Progress Notes (Signed)
Resent Doxycycline to [facility] since not previously received.

## 2017-07-26 ENCOUNTER — Ambulatory Visit: Payer: BLUE CROSS/BLUE SHIELD | Admitting: Cardiovascular Disease

## 2017-07-27 NOTE — Progress Notes (Signed)
Cardiology Office Note  Date:  07/28/2017   ID:  Gabrielle Pineda, Gabrielle Pineda [birth year], MRN [id number]  PCP:  Birdie Sons, MD   Chief Complaint  Patient presents with  . other    12 month f/u c/o hacking coughing causing chest discomfort. Meds reviewed verbally with pt.    HPI:  Gabrielle Pineda is a very pleasant 61 yo old woman with a  long history of smoking since age 59,  hyperlipidemia,  history of tachycardia/possible SVT  at times requiring adenosine? (details not available), and chest pain  chronic neck and shoulder pain. Prior surgeries. who presents for routine follow up of her tachycardia.   Tailbone broke, severe pain Neck cervical fusion, "did not take" "bones are soft  Chest pain with coughing, PND  Trying to quit smoking Blood pressure running high, was high in [place]  Right rotator cuff surgery October 2017  Denies any significant tachycardia, palpitations Denies any significant shortness of breath or chest discomfort   tolerating Lipitor 40 mg daily Continues to smoke, interested in Chantix at this time Nicotine patches did not work for her  Lab work reviewed with her HBA1C 5.1, Total chol  168  Previously seen by rheumatology for rheumatoid arthritis. Currently on methotrexate  No EKG on today's visit  Other past medical history  On previous office visits, reported having chronic neck and shoulder pain.   Previous EKG shows normal sinus rhythm with a rate of 85 beats per minute with no significant ST or T wave changes  Lab work from January 2014 shows total cholesterol 196, LDL 114, HDL 59 Lab work from February 2015 shows total cholesterol 212, LDL 136, HDL 66  PMH:   has a past medical history of Anemia, Cervicalgia, Depression, GERD (gastroesophageal reflux disease), Heart murmur, Other and unspecified hyperlipidemia, and Tachycardia.  PSH:    Past Surgical History:  Procedure Laterality Date  . BREAST ENHANCEMENT SURGERY  1981  .  CARPAL TUNNEL RELEASE  11/11/2011   Procedure: CARPAL TUNNEL RELEASE;  Surgeon: Floyce Stakes, MD;  Location: [facility];  Service: Neurosurgery;  Laterality: Right;  Right Median Nerve Decompression  . CARPAL TUNNEL RELEASE  2013  . ESOPHAGOGASTRODUODENOSCOPY    . neck disc surgery     plates and screws in neck x 2  . ROTATOR CUFF REPAIR  06/16/2016   right shoulder   . ROTATOR CUFF REPAIR    . TONSILLECTOMY AND ADENOIDECTOMY  1973     Current Outpatient Medications  Medication Sig Dispense Refill  . albuterol (PROVENTIL HFA;VENTOLIN HFA) 108 (90 Base) MCG/ACT inhaler Inhale 2 puffs into the lungs every 6 (six) hours as needed for wheezing or shortness of breath. 1 Inhaler 6  . atorvastatin (LIPITOR) 40 MG tablet TAKE ONE TABLET BY MOUTH ONCE DAILY 90 tablet 0  . b complex vitamins tablet Take 1 tablet by mouth daily. B12    . cyclobenzaprine (FLEXERIL) 10 MG tablet Take 1 tablet (10 mg total) by mouth 3 (three) times daily as needed for muscle spasms. 90 tablet 0  . doxycycline (VIBRA-TABS) 100 MG tablet Take 1 tablet (100 mg total) by mouth 2 (two) times daily. 20 tablet 0  . FOLIC ACID PO Take 10 mg tablet twice a day.    . gabapentin (NEURONTIN) 300 MG capsule Start with one capsule PO q hs x 1 week, then increase to 2 capsules PO q hs x 1 week, and if needed may increase to 3 capsules PO q  hs 90 capsule 0  . guaiFENesin (MUCINEX) 600 MG 12 hr tablet Take 1,200 mg by mouth 2 (two) times daily.    Marland Kitchen ibuprofen (ADVIL,MOTRIN) 200 MG tablet Take 200 mg by mouth every 6 (six) hours as needed. For pain    . meloxicam (MOBIC) 7.5 MG tablet Take 7.5 mg by mouth daily.    . Methotrexate, PF, 25 MG/0.5ML SOAJ 0.33mL once weekly 0.5 mL 11  . Multiple Vitamin (MULTIVITAMIN) tablet Take 1 tablet by mouth daily.      . Nutritional Supplements (ESTROVEN ENERGY PO) Take by mouth daily.    Marland Kitchen omeprazole (PRILOSEC) 20 MG capsule Take 20 mg by mouth daily.    Marland Kitchen oxyCODONE (OXY IR/ROXICODONE) 5 MG  immediate release tablet 1-2 tabs every 6 hours as needed 240 tablet 0  . sertraline (ZOLOFT) 100 MG tablet TAKE 1 TABLET BY MOUTH ONCE DAILY 90 tablet 1  . traZODone (DESYREL) 50 MG tablet TAKE 1 TO 2 TABLETS BY MOUTH AT BEDTIME 180 tablet 1  . valACYclovir (VALTREX) 1000 MG tablet TAKE TWO TABLETS BY MOUTH TWICE DAILY FOR 5 DAYS AS NEEDED 40 tablet 3  . Venlafaxine HCl 75 MG TB24 TAKE 1 TABLET BY MOUTH THREE TIMES DAILY (Patient taking differently: TAKE 3 TABLET BY MOUTH DAILY) 270 tablet 3   Current Facility-Administered Medications  Medication Dose Route Frequency Provider Last Rate Last Dose  . ipratropium-albuterol (DUONEB) 0.5-2.5 (3) MG/3ML nebulizer solution 3 mL  3 mL Nebulization Once Pollak, Adriana M, PA-C         Allergies:   Plaquenil [hydroxychloroquine]   Social History:  The patient  reports that she has been smoking.  She has a 20.00 pack-year smoking history. she has never used smokeless tobacco. She reports that she does not drink alcohol or use drugs.   Family History:   family history includes Alcohol abuse in her father and mother; Bipolar disorder in her mother; Pancreatic cancer in her father; Suicidality in her mother.    Review of Systems: Review of Systems  Constitutional: Negative.   Respiratory: Negative.   Cardiovascular: Negative.   Gastrointestinal: Negative.   Musculoskeletal: Positive for back pain, joint pain and neck pain.  Neurological: Negative.   Psychiatric/Behavioral: Negative.   All other systems reviewed and are negative.    PHYSICAL EXAM: VS:  BP (!) 146/90 (BP Location: Left Arm, Patient Position: Sitting, Cuff Size: Normal)   Pulse 85   Ht 5\' 7"  (1.702 m)   Wt 149 lb (67.6 kg)   BMI 23.34 kg/m  , BMI Body mass index is 23.34 kg/m. GEN: Well nourished, well developed, in no acute distress  HEENT: normal  Neck: no JVD, carotid bruits, or masses Cardiac: RRR; no murmurs, rubs, or gallops,no edema  Respiratory:  clear to  auscultation bilaterally, normal work of breathing GI: soft, nontender, nondistended, + BS MS: no deformity or atrophy  Skin: warm and dry, no rash Neuro:  Strength and sensation are intact Psych: euthymic mood, full affect    Recent Labs: 04/16/2017: ALT 12; BUN 10; Creatinine, Ser 0.73; Hemoglobin 12.0; Platelets 297; Potassium 3.8; Sodium 144; TSH 2.160    Lipid Panel Lab Results  Component Value Date   CHOL 195 04/16/2017   HDL 46 04/16/2017   LDLCALC 121 (H) 04/16/2017   TRIG 138 04/16/2017      Wt Readings from Last 3 Encounters:  07/28/17 149 lb (67.6 kg)  07/07/17 149 lb 12.8 oz (67.9 kg)  04/09/17 145 lb 12.8  oz (66.1 kg)       ASSESSMENT AND PLAN:  Palpitations -  Denies any recent episodes of palpitations. No further workup needed No further workup  Hypertension She will monitor blood pressures at home for now and call us with numbers If blood pressure does continue to run high we could consider starting diltiazem which would assist with arrhythmia and blood pressure control  Paroxysmal supraventricular tachycardia (HCC) - No further workup Will consider diltiazem if needed for hypertension  Hypercholesteremia Cholesterol is at goal on the current lipid regimen. No changes to the medications were made. Encourage compliance with her Lipitor  SMOKER Prescription for Chantix provided   Total encounter time more than 25 minutes  Greater than 50% was spent in counseling and coordination of care with the patient   Disposition:   F/U  12 months    Orders Placed This Encounter  Procedures  . EKG 12-Lead     Signed, Esmond Plants, M.D., Ph.D. 07/28/2017  [place], [place]

## 2017-07-28 ENCOUNTER — Encounter: Payer: Self-pay | Admitting: Cardiovascular Disease

## 2017-07-28 ENCOUNTER — Ambulatory Visit (INDEPENDENT_AMBULATORY_CARE_PROVIDER_SITE_OTHER): Payer: BLUE CROSS/BLUE SHIELD | Admitting: Cardiovascular Disease

## 2017-07-28 VITALS — BP 146/90 | HR 85 | Ht 67.0 in | Wt 149.0 lb

## 2017-07-28 DIAGNOSIS — J418 Mixed simple and mucopurulent chronic bronchitis: Secondary | ICD-10-CM

## 2017-07-28 DIAGNOSIS — E782 Mixed hyperlipidemia: Secondary | ICD-10-CM

## 2017-07-28 DIAGNOSIS — F17200 Nicotine dependence, unspecified, uncomplicated: Secondary | ICD-10-CM | POA: Diagnosis not present

## 2017-07-28 DIAGNOSIS — I471 Supraventricular tachycardia, unspecified: Secondary | ICD-10-CM

## 2017-07-28 DIAGNOSIS — R0602 Shortness of breath: Secondary | ICD-10-CM | POA: Diagnosis not present

## 2017-07-28 DIAGNOSIS — E7800 Pure hypercholesterolemia, unspecified: Secondary | ICD-10-CM

## 2017-07-28 DIAGNOSIS — I10 Essential (primary) hypertension: Secondary | ICD-10-CM

## 2017-07-28 MED ORDER — VARENICLINE TARTRATE 1 MG PO TABS: 1.0000 mg | ORAL_TABLET | Freq: Two times a day (BID) | ORAL | 4 refills | Status: DC

## 2017-07-28 NOTE — Patient Instructions (Addendum)
Please monitor BP at home Call with numbers  Ideally <140/<90 130s/80s good   Medication Instructions:   We will call in a script for chantix  Labwork:  No new labs needed  Testing/Procedures:  No further testing at this time   Follow-Up: It was a pleasure seeing you in the office today. Please call us if you have new issues that need to be addressed before your next appt.  [phone]  Your physician wants you to follow-up in: 12 months.  You will receive a reminder letter in the mail two months in advance. If you don't receive a letter, please call our office to schedule the follow-up appointment.  If you need a refill on your cardiac medications before your next appointment, please call your pharmacy.

## 2017-07-29 ENCOUNTER — Ambulatory Visit: Payer: Disability Insurance | Attending: Internal Medicine

## 2017-07-29 DIAGNOSIS — J449 Chronic obstructive pulmonary disease, unspecified: Secondary | ICD-10-CM

## 2017-08-02 ENCOUNTER — Ambulatory Visit (INDEPENDENT_AMBULATORY_CARE_PROVIDER_SITE_OTHER): Payer: BLUE CROSS/BLUE SHIELD | Admitting: Physician Assistant

## 2017-08-02 ENCOUNTER — Encounter: Payer: Self-pay | Admitting: Physician Assistant

## 2017-08-02 VITALS — BP 120/80 | HR 76 | Temp 98.4°F | Resp 16 | Wt 151.0 lb

## 2017-08-02 DIAGNOSIS — M5412 Radiculopathy, cervical region: Secondary | ICD-10-CM | POA: Insufficient documentation

## 2017-08-02 DIAGNOSIS — M8448XD Pathological fracture, other site, subsequent encounter for fracture with routine healing: Secondary | ICD-10-CM

## 2017-08-02 DIAGNOSIS — G629 Polyneuropathy, unspecified: Secondary | ICD-10-CM

## 2017-08-02 DIAGNOSIS — S32050D Wedge compression fracture of fifth lumbar vertebra, subsequent encounter for fracture with routine healing: Secondary | ICD-10-CM

## 2017-08-02 DIAGNOSIS — J0140 Acute pansinusitis, unspecified: Secondary | ICD-10-CM | POA: Diagnosis not present

## 2017-08-02 MED ORDER — AMOXICILLIN-POT CLAVULANATE 875-125 MG PO TABS: 1.0000 | ORAL_TABLET | Freq: Two times a day (BID) | ORAL | 0 refills | Status: DC

## 2017-08-02 MED ORDER — GABAPENTIN 600 MG PO TABS: 1200.0000 mg | ORAL_TABLET | Freq: Every day | ORAL | 0 refills | Status: DC

## 2017-08-02 NOTE — Progress Notes (Signed)
Patient: Gabrielle Pineda Female    DOB: 1956-02-24   61 y.o.   MRN: [id number] Visit Date: 08/02/2017  Today's Provider: [name], PA-C   No chief complaint on file.  Subjective:    HPI  Depression, Follow-up  She  was last seen for this 1 months ago. Changes made at last visit include no changes.   She reports excellent compliance with treatment. She is not having side effects.   She reports excellent tolerance of treatment. Current symptoms include: difficulty concentrating She feels she is Unchanged since last visit.  ------------------------------------------------------------------------   Follow up for neck pain  The patient was last seen for this 1 months ago. Changes made at last visit include start Gabapentin.  She reports excellent compliance with treatment. She feels that condition is Unchanged. Patient reports she is taking 3 capsules of Gabapentin every night, reports mild improvement. She is not having side effects.  Patient reports taking Oxycodone a couple a tablets every day. Patient reports that at times she will take 2 tablets first in the morning and then one tablet later on the day. ------------------------------------------------------------------------------------  Patient C/O persistent URI symptoms. Patient was started on Doxy on 07/22/17 pt reports good tolerance and compliance with medication. Patient reports that symptoms did get better, but have not completely resolved.  Patient reports taking Trazodone 50 mg 1 tablet every night to help with sleep. Patient reports that since starting Gabapentin she has decreased to one tablet at bed time.      Allergies  Allergen Reactions  . Plaquenil [Hydroxychloroquine] Rash     Current Outpatient Medications:  .  albuterol (PROVENTIL HFA;VENTOLIN HFA) 108 (90 Base) MCG/ACT inhaler, Inhale 2 puffs into the lungs every 6 (six) hours as needed for wheezing or shortness of breath.,  Disp: 1 Inhaler, Rfl: 6 .  atorvastatin (LIPITOR) 40 MG tablet, TAKE ONE TABLET BY MOUTH ONCE DAILY, Disp: 90 tablet, Rfl: 0 .  b complex vitamins tablet, Take 1 tablet by mouth daily. B12, Disp: , Rfl:  .  cyclobenzaprine (FLEXERIL) 10 MG tablet, Take 1 tablet (10 mg total) by mouth 3 (three) times daily as needed for muscle spasms., Disp: 90 tablet, Rfl: 0 .  FOLIC ACID PO, Take 10 mg tablet twice a day., Disp: , Rfl:  .  gabapentin (NEURONTIN) 300 MG capsule, Start with one capsule PO q hs x 1 week, then increase to 2 capsules PO q hs x 1 week, and if needed may increase to 3 capsules PO q hs, Disp: 90 capsule, Rfl: 0 .  guaiFENesin (MUCINEX) 600 MG 12 hr tablet, Take 1,200 mg by mouth 2 (two) times daily., Disp: , Rfl:  .  ibuprofen (ADVIL,MOTRIN) 200 MG tablet, Take 200 mg by mouth every 6 (six) hours as needed. For pain, Disp: , Rfl:  .  meloxicam (MOBIC) 7.5 MG tablet, Take 7.5 mg by mouth daily., Disp: , Rfl:  .  Methotrexate, PF, 25 MG/0.5ML SOAJ, 0.29mL once weekly, Disp: 0.5 mL, Rfl: 11 .  Multiple Vitamin (MULTIVITAMIN) tablet, Take 1 tablet by mouth daily.  , Disp: , Rfl:  .  Nutritional Supplements (ESTROVEN ENERGY PO), Take by mouth daily., Disp: , Rfl:  .  omeprazole (PRILOSEC) 20 MG capsule, Take 20 mg by mouth daily., Disp: , Rfl:  .  oxyCODONE (OXY IR/ROXICODONE) 5 MG immediate release tablet, 1-2 tabs every 6 hours as needed, Disp: 240 tablet, Rfl: 0 .  sertraline (ZOLOFT) 100 MG tablet,  TAKE 1 TABLET BY MOUTH ONCE DAILY, Disp: 90 tablet, Rfl: 1 .  traZODone (DESYREL) 50 MG tablet, TAKE 1 TO 2 TABLETS BY MOUTH AT BEDTIME, Disp: 180 tablet, Rfl: 1 .  valACYclovir (VALTREX) 1000 MG tablet, TAKE TWO TABLETS BY MOUTH TWICE DAILY FOR 5 DAYS AS NEEDED, Disp: 40 tablet, Rfl: 3 .  Venlafaxine HCl 75 MG TB24, TAKE 1 TABLET BY MOUTH THREE TIMES DAILY (Patient taking differently: TAKE 3 TABLET BY MOUTH DAILY), Disp: 270 tablet, Rfl: 3 .  varenicline (CHANTIX CONTINUING MONTH PAK) 1 MG  tablet, Take 1 tablet (1 mg total) by mouth 2 (two) times daily. (Patient not taking: Reported on 08/02/2017), Disp: 60 tablet, Rfl: 4  Current Facility-Administered Medications:  .  ipratropium-albuterol (DUONEB) 0.5-2.5 (3) MG/3ML nebulizer solution 3 mL, 3 mL, Nebulization, Once, Pollak, Adriana M, PA-C  Review of Systems  HENT: Positive for congestion, postnasal drip, rhinorrhea, sinus pressure, sinus pain and sore throat. Negative for ear pain and trouble swallowing.   Respiratory: Positive for cough, shortness of breath and wheezing. Negative for chest tightness.   Cardiovascular: Negative for chest pain, palpitations and leg swelling.  Gastrointestinal: Negative for abdominal pain and nausea.  Musculoskeletal: Positive for arthralgias, neck pain and neck stiffness.  Neurological: Positive for weakness, numbness and headaches. Negative for dizziness and light-headedness.  Psychiatric/Behavioral: Positive for sleep disturbance.    Social History   Tobacco Use  . Smoking status: Current Every Day Smoker    Packs/day: 0.50    Years: 40.00    Pack years: 20.00  . Smokeless tobacco: Never Used  Substance Use Topics  . Alcohol use: No    Comment: recovering alcoholic Since 1610    Objective:   BP 120/80 (BP Location: Left Arm, Patient Position: Sitting, Cuff Size: Normal)   Pulse 76   Temp 98.4 F (36.9 C) (Oral)   Resp 16   Wt 151 lb (68.5 kg)   SpO2 95%   BMI 23.65 kg/m  Vitals:   08/02/17 1129  BP: 120/80  Pulse: 76  Resp: 16  Temp: 98.4 F (36.9 C)  TempSrc: Oral  SpO2: 95%  Weight: 151 lb (68.5 kg)     Physical Exam  Constitutional: She appears well-developed and well-nourished. No distress.  HENT:  Head: Normocephalic and atraumatic.  Right Ear: Hearing, tympanic membrane, external ear and ear canal normal.  Left Ear: Hearing, tympanic membrane, external ear and ear canal normal.  Nose: Mucosal edema present. No rhinorrhea. Right sinus exhibits maxillary  sinus tenderness and frontal sinus tenderness. Left sinus exhibits maxillary sinus tenderness and frontal sinus tenderness.  Mouth/Throat: Uvula is midline and mucous membranes are normal. Posterior oropharyngeal erythema present. No oropharyngeal exudate or posterior oropharyngeal edema.  Eyes: Conjunctivae are normal. Pupils are equal, round, and reactive to light. Right eye exhibits no discharge. Left eye exhibits no discharge. No scleral icterus.  Neck: Neck supple. Spinous process tenderness and muscular tenderness present. Decreased range of motion present. No tracheal deviation present. No thyromegaly present.  Cardiovascular: Normal rate, regular rhythm and normal heart sounds. Exam reveals no gallop and no friction rub.  No murmur heard. Pulmonary/Chest: Effort normal and breath sounds normal. No stridor. No respiratory distress. She has no wheezes. She has no rales.  Lymphadenopathy:    She has no cervical adenopathy.  Skin: Skin is warm and dry. She is not diaphoretic.  Vitals reviewed.      Assessment & Plan:     1. Cervical radiculopathy Somewhat improved with  gabapentin. Has been taking 900mg  nightly. Will increase to 1200mg  nightly as below. Referral also placed to pain clinic. Had ESI at [facility] in [place] through worker's comp claim just this month that has been effective. She is interested in establishing with a pain clinic that performs ESIs to hopefully be able to continue to get prn. Referral has been placed as below. - gabapentin (NEURONTIN) 600 MG tablet; Take 2 tablets (1,200 mg total) by mouth at bedtime.  Dispense: 60 tablet; Refill: 0 - Ambulatory referral to Pain Clinic  2. Neuropathy See above medical treatment plan. - gabapentin (NEURONTIN) 600 MG tablet; Take 2 tablets (1,200 mg total) by mouth at bedtime.  Dispense: 60 tablet; Refill: 0 - Ambulatory referral to Pain Clinic  3. Acute pansinusitis, recurrence not specified Worsening symptoms that have not  responded to OTC medications. Will give augmentin as below. Continue allergy medications. Stay well hydrated and get plenty of rest. Call if no symptom improvement or if symptoms worsen. - amoxicillin-clavulanate (AUGMENTIN) 875-125 MG tablet; Take 1 tablet by mouth 2 (two) times daily.  Dispense: 20 tablet; Refill: 0  4. Sacral insufficiency fracture with routine healing See above medical treatment plan for #1. - Ambulatory referral to Pain Clinic  5. Closed compression fracture of L5 lumbar vertebra with routine healing, subsequent encounter See above medical treatment plan for #1. - Ambulatory referral to [facility], PA-C  [facility]

## 2017-08-04 ENCOUNTER — Encounter: Payer: Self-pay | Admitting: Physician Assistant

## 2017-08-25 ENCOUNTER — Telehealth: Payer: Self-pay | Admitting: Family Medicine

## 2017-08-25 ENCOUNTER — Other Ambulatory Visit: Payer: Self-pay | Admitting: Family Medicine

## 2017-08-25 ENCOUNTER — Encounter: Payer: Self-pay | Admitting: Physician Assistant

## 2017-08-25 DIAGNOSIS — M069 Rheumatoid arthritis, unspecified: Secondary | ICD-10-CM

## 2017-08-25 DIAGNOSIS — S32050D Wedge compression fracture of fifth lumbar vertebra, subsequent encounter for fracture with routine healing: Secondary | ICD-10-CM

## 2017-08-25 DIAGNOSIS — M150 Primary generalized (osteo)arthritis: Secondary | ICD-10-CM

## 2017-08-25 DIAGNOSIS — M8448XD Pathological fracture, other site, subsequent encounter for fracture with routine healing: Secondary | ICD-10-CM

## 2017-08-25 DIAGNOSIS — M159 Polyosteoarthritis, unspecified: Secondary | ICD-10-CM

## 2017-08-25 MED ORDER — OXYCODONE HCL 5 MG PO TABS: ORAL_TABLET | ORAL | 0 refills | Status: DC

## 2017-08-25 MED ORDER — VALACYCLOVIR HCL 1 G PO TABS: ORAL_TABLET | ORAL | 3 refills | Status: DC

## 2017-08-25 NOTE — Telephone Encounter (Signed)
Rx printed

## 2017-08-25 NOTE — Telephone Encounter (Signed)
Patient is requesting refill on her oxyCODONE (OXY IR/ROXICODONE) 5 MG immediate release tablet

## 2017-08-25 NOTE — Telephone Encounter (Signed)
[facility] faxed a refill request for the following medication. Thanks CC  valACYclovir (VALTREX) 1000 MG tablet

## 2017-08-25 NOTE — Telephone Encounter (Signed)
Patient advised prescription is placed up front ready for pick up.  Thanks,  -Thayer Inabinet

## 2017-08-30 ENCOUNTER — Other Ambulatory Visit: Payer: Self-pay | Admitting: Physician Assistant

## 2017-08-30 DIAGNOSIS — G629 Polyneuropathy, unspecified: Secondary | ICD-10-CM

## 2017-08-30 DIAGNOSIS — M5412 Radiculopathy, cervical region: Secondary | ICD-10-CM

## 2017-09-07 ENCOUNTER — Encounter: Payer: Self-pay | Admitting: Student in an Organized Health Care Education/Training Program

## 2017-09-07 ENCOUNTER — Ambulatory Visit
Payer: BLUE CROSS/BLUE SHIELD | Attending: Student in an Organized Health Care Education/Training Program | Admitting: Student in an Organized Health Care Education/Training Program

## 2017-09-07 VITALS — BP 149/85 | HR 95 | Temp 97.8°F | Resp 16 | Ht 69.0 in | Wt 147.0 lb

## 2017-09-07 DIAGNOSIS — M2578 Osteophyte, vertebrae: Secondary | ICD-10-CM | POA: Diagnosis not present

## 2017-09-07 DIAGNOSIS — G894 Chronic pain syndrome: Secondary | ICD-10-CM

## 2017-09-07 DIAGNOSIS — G5603 Carpal tunnel syndrome, bilateral upper limbs: Secondary | ICD-10-CM | POA: Diagnosis not present

## 2017-09-07 DIAGNOSIS — M5412 Radiculopathy, cervical region: Secondary | ICD-10-CM | POA: Diagnosis not present

## 2017-09-07 DIAGNOSIS — I10 Essential (primary) hypertension: Secondary | ICD-10-CM | POA: Insufficient documentation

## 2017-09-07 DIAGNOSIS — Q761 Klippel-Feil syndrome: Secondary | ICD-10-CM | POA: Diagnosis not present

## 2017-09-07 DIAGNOSIS — Z79899 Other long term (current) drug therapy: Secondary | ICD-10-CM | POA: Diagnosis not present

## 2017-09-07 DIAGNOSIS — M542 Cervicalgia: Secondary | ICD-10-CM

## 2017-09-07 DIAGNOSIS — D649 Anemia, unspecified: Secondary | ICD-10-CM | POA: Diagnosis not present

## 2017-09-07 DIAGNOSIS — M4802 Spinal stenosis, cervical region: Secondary | ICD-10-CM | POA: Diagnosis not present

## 2017-09-07 DIAGNOSIS — M069 Rheumatoid arthritis, unspecified: Secondary | ICD-10-CM | POA: Diagnosis not present

## 2017-09-07 DIAGNOSIS — J449 Chronic obstructive pulmonary disease, unspecified: Secondary | ICD-10-CM | POA: Insufficient documentation

## 2017-09-07 DIAGNOSIS — M4722 Other spondylosis with radiculopathy, cervical region: Secondary | ICD-10-CM | POA: Diagnosis not present

## 2017-09-07 DIAGNOSIS — F329 Major depressive disorder, single episode, unspecified: Secondary | ICD-10-CM | POA: Insufficient documentation

## 2017-09-07 DIAGNOSIS — K219 Gastro-esophageal reflux disease without esophagitis: Secondary | ICD-10-CM | POA: Diagnosis not present

## 2017-09-07 DIAGNOSIS — M48061 Spinal stenosis, lumbar region without neurogenic claudication: Secondary | ICD-10-CM | POA: Diagnosis not present

## 2017-09-07 DIAGNOSIS — Z9889 Other specified postprocedural states: Secondary | ICD-10-CM

## 2017-09-07 DIAGNOSIS — M5124 Other intervertebral disc displacement, thoracic region: Secondary | ICD-10-CM | POA: Insufficient documentation

## 2017-09-07 DIAGNOSIS — G629 Polyneuropathy, unspecified: Secondary | ICD-10-CM | POA: Diagnosis not present

## 2017-09-07 NOTE — Progress Notes (Signed)
Safety precautions to be maintained throughout the outpatient stay will include: orient to surroundings, keep bed in low position, maintain call bell within reach at all times, provide assistance with transfer out of bed and ambulation.  

## 2017-09-07 NOTE — Progress Notes (Signed)
Patient's Name: Gabrielle Pineda  MRN: [id number]  Referring Provider: [name], [place]*  DOB: 06-Aug-1956  PCP: Birdie Sons, MD  DOS: 09/07/2017  Note by: Gabrielle Santa, MD  Service setting: Ambulatory outpatient  Specialty: Interventional Pain Management  Location: [facility] (AMB) Pain Management Facility  Visit type: Initial Patient Evaluation  Patient type: New Patient   Primary Reason(s) for Visit: Encounter for initial evaluation of one or more chronic problems (new to examiner) potentially causing chronic pain, and posing a threat to normal musculoskeletal function. (Level of risk: High) CC: Neck Pain (bilateral s/p 2012, 2014, 2017 surgical procedures) and Wrist Pain (carpal tunnel syndrome s/p surgery 2015)  HPI  [name] is a 62 y.o. year old, female patient, who comes today to see [place] for the first time for an initial evaluation of her chronic pain. She has Hyperlipidemia; SMOKER; Paroxysmal supraventricular tachycardia ([place]); CHEST PAIN UNSPECIFIED; ELECTROCARDIOGRAM, ABNORMAL; Shortness of breath; Closed fracture of distal phalanx of thumb; Acid reflux; Arthritis, degenerative; Arthritis or polyarthritis, rheumatoid ([place]); B-complex deficiency; CN (constipation); Clinical depression; Family history of malignant neoplasm of pancreas; Cold sore; H/O alcohol abuse; Hypercholesteremia; Disorder of iron metabolism; Cannot sleep; Localized osteoarthrosis, hand; Menopausal symptom; Weight loss; Delayed onset of urination; COPD (chronic obstructive pulmonary disease) ([place]); Closed compression fracture of L5 lumbar vertebra ([place]); Sacral insufficiency fracture with routine healing; Essential hypertension; Cervical radiculopathy; and Neuropathy on their problem list. Today she comes in for evaluation of her Neck Pain (bilateral s/p 2012, 2014, 2017 surgical procedures) and Wrist Pain (carpal tunnel syndrome s/p surgery 2015)  Pain Assessment: Location: Right, Left Neck Radiating: into both  shoulders and down arms  Onset: More than a month ago Duration: Chronic pain Quality: Aching, Constant, Discomfort, Spasm Severity: 7 /10 (self-reported pain score)  Note: Reported level is inconsistent with clinical observations. Clinically the patient looks like a 3/10 A 3/10 is viewed as "Moderate" and described as significantly interfering with activities of daily living (ADL). It becomes difficult to feed, bathe, get dressed, get on and off the toilet or to perform personal hygiene functions. Difficult to get in and out of bed or a chair without assistance. Very distracting. With effort, it can be ignored when deeply involved in activities.       When using our objective Pain Scale, levels between 6 and 10/10 are said to belong in an emergency room, as it progressively worsens from a 6/10, described as severely limiting, requiring emergency care not usually available at an outpatient pain management facility. At a 6/10 level, communication becomes difficult and requires great effort. Assistance to reach the emergency department may be required. Facial flushing and profuse sweating along with potentially dangerous increases in heart rate and blood pressure will be evident. Effect on ADL: patient is in the midst of applying for Disability.  ROM limited  Timing: Constant Modifying factors: medications  Onset and Duration: Gradual and Date of onset: 2011-2012 Cause of pain: Unknown Severity: Getting worse, NAS-11 at its worse: 10/10, NAS-11 at its best: 5/10, NAS-11 now: 8/10 and NAS-11 on the average: 8/10 Timing: Not influenced by the time of the day Aggravating Factors: Motion Alleviating Factors: Medications Associated Problems: Constipation, Depression, Fatigue, Inability to concentrate, Numbness, Sadness, Spasms, Sweating, Tingling, Weakness, Pain that wakes patient up and Pain that does not allow patient to sleep Quality of Pain: Aching, Distressing, Horrible, Throbbing, Tiring and  Uncomfortable Previous Examinations or Tests: Ct-Myelogram, MRI scan, Myelogram, Spinal tap, X-rays and Nerve conduction test Previous Treatments:  Epidural steroid injections and Narcotic medications  The patient comes into the clinics today for the first time for a chronic pain management evaluation.   62 year old female with a primary complaint of neck pain that radiates down into bilateral hands.  Patient's past medical history significant for rheumatoid arthritis, 2012 C3-4 fusion, 2014 C4-6 fusion and 2017 C6-T1 fusion.  Patient recently went to the [facility] in [facility] for a comprehensive cervical spine evaluation.  There she met with neurosurgery, physical medicine, pain medicine to help with her symptoms.  At that time she received a left C7-T1 ESI at the [facility] in [facility] which was effective for her upper extremity radicular symptoms.  In regards to medication management, patient takes oxycodone 5-10 mg up to 4 times a day as needed but states that she generally takes it twice a day 1-2 tablets as needed.  Patient is also had an EMG completed which shows length dependent peripheral neuropathy.  Patient also has carpal tunnel syndrome status post carpal tunnel surgery 2015.  Patient has previously tried Flexeril, ibuprofen, meloxicam which are only somewhat helpful.  She takes Zoloft 100 mg daily.  Patient also takes gabapentin 1200 mg nightly.  This was a recent increase by her PCP.  Patient is motivated to decrease her opioid intake.  She states that she is still getting benefit from her cervical epidural steroid injection but is interested in repeating them in the future when her pain returns.  Today I took the time to provide the patient with information regarding my pain practice. The patient was informed that my practice is divided into two sections: an interventional pain management section, as well as a completely separate and distinct medication management  section. I explained that I have procedure days for my interventional therapies, and evaluation days for follow-ups and medication management. Because of the amount of documentation required during both, they are kept separated. This means that there is the possibility that she may be scheduled for a procedure on one day, and medication management the next. I have also informed her that because of staffing and facility limitations, I no longer take patients for medication management only. To illustrate the reasons for this, I gave the patient the example of surgeons, and how inappropriate it would be to refer a patient to his/her care, just to write for the post-surgical antibiotics on a surgery done by a different surgeon.   Because interventional pain management is my board-certified specialty, the patient was informed that joining my practice means that they are open to any and all interventional therapies. I made it clear that this does not mean that they will be forced to have any procedures done. What this means is that I believe interventional therapies to be essential part of the diagnosis and proper management of chronic pain conditions. Therefore, patients not interested in these interventional alternatives will be better served under the care of a different practitioner.  The patient was also made aware of my Comprehensive Pain Management Safety Guidelines where by joining my practice, they limit all of their nerve blocks and joint injections to those done by our practice, for as long as we are retained to manage their care.   Historic Controlled Substance Pharmacotherapy Review  PMP and historical list of controlled substances: Oxycodone 5 mg, 1-2 tablets up to 4 times daily as needed.,  Quantity 69-month  MME equals 60.  Last fill 08/31/2017, quantity 240 Medications: The patient did not bring the medication(s) to  the appointment, as requested in our "New Patient  Package" Pharmacodynamics: Desired effects: Analgesia: The patient reports >50% benefit. Reported improvement in function: The patient reports medication allows her to accomplish basic ADLs. Clinically meaningful improvement in function (CMIF): Sustained CMIF goals met Perceived effectiveness: Described as relatively effective, allowing for increase in activities of daily living (ADL) Undesirable effects: Side-effects or Adverse reactions: None reported Historical Monitoring: The patient  reports that she does not use drugs. List of all UDS Test(s): No results found for: MDMA, COCAINSCRNUR, [place], [place], CANNABQUANT, [place], [place] List of other Serum/Urine Drug Screening Test(s):  No results found for: AMPHSCRSER, BARBSCRSER, BENZOSCRSER, COCAINSCRSER, COCAINSCRNUR, PCPSCRSER, PCPQUANT, THCSCRSER, THCU, CANNABQUANT, OPIATESCRSER, OXYSCRSER, PROPOXSCRSER, ETH Historical Background Evaluation: [place] PMP: Six (6) year initial data search conducted.             [place] Department of public safety, offender search: Editor, commissioning Information) Non-contributory Risk Assessment Profile: Aberrant behavior: None observed or detected today Fatal overdose hazard ratio (HR): Calculation deferred Non-fatal overdose hazard ratio (HR): Calculation deferred Risk of opioid abuse or dependence: 0.7-3.0% with doses ? 36 MME/day and 6.1-26% with doses ? 120 MME/day. Substance use disorder (SUD) risk level: Low Opioid risk tool (ORT) (Total Score): 6 Opioid Risk Tool - 09/07/17 1350      Family History of Substance Abuse   Alcohol  Positive Female mother and father both deceased   mother and father both deceased   Illegal Drugs  Positive Female oldest sister, deceased   oldest sister, deceased   Rx Drugs  Negative      Personal History of Substance Abuse   Alcohol  -- sober since 35   sober since 1994   Illegal Drugs  Negative    Rx Drugs  Negative      Psychological Disease   Psychological Disease   Positive    ADD  Negative    OCD  Negative    Bipolar  Negative    Schizophrenia  Negative    Depression  Positive takes zoloft and effexor and feels for the most part they are managing depression    takes zoloft and effexor and feels for the most part they are managing depression      Total Score   Opioid Risk Tool Scoring  6    Opioid Risk Interpretation  Moderate Risk      ORT Scoring interpretation table:  Score <3 = Low Risk for SUD  Score between 4-7 = Moderate Risk for SUD  Score >8 = High Risk for Opioid Abuse   PHQ-2 Depression Scale:  Total score: 0  PHQ-2 Scoring interpretation table: (Score and probability of major depressive disorder)  Score 0 = No depression  Score 1 = 15.4% Probability  Score 2 = 21.1% Probability  Score 3 = 38.4% Probability  Score 4 = 45.5% Probability  Score 5 = 56.4% Probability  Score 6 = 78.6% Probability   PHQ-9 Depression Scale:  Total score: 0  PHQ-9 Scoring interpretation table:  Score 0-4 = No depression  Score 5-9 = Mild depression  Score 10-14 = Moderate depression  Score 15-19 = Moderately severe depression  Score 20-27 = Severe depression (2.4 times higher risk of SUD and 2.89 times higher risk of overuse)   Pharmacologic Plan: As per protocol, I have not taken over any controlled substance management, pending the results of ordered tests and/or consults.            Initial impression: Pending review of available data and  ordered tests.  Meds   Current Outpatient Medications:  .  albuterol (PROVENTIL HFA;VENTOLIN HFA) 108 (90 Base) MCG/ACT inhaler, Inhale 2 puffs into the lungs every 6 (six) hours as needed for wheezing or shortness of breath., Disp: 1 Inhaler, Rfl: 6 .  atorvastatin (LIPITOR) 40 MG tablet, TAKE ONE TABLET BY MOUTH ONCE DAILY, Disp: 90 tablet, Rfl: 0 .  b complex vitamins tablet, Take 1 tablet by mouth daily. B12, Disp: , Rfl:  .  FOLIC ACID PO, Take 10 mg tablet twice a day., Disp: , Rfl:  .  gabapentin  (NEURONTIN) 600 MG tablet, TAKE 2 TABLETS BY MOUTH AT BEDTIME, Disp: 60 tablet, Rfl: 5 .  guaiFENesin (MUCINEX) 600 MG 12 hr tablet, Take 1,200 mg by mouth 2 (two) times daily., Disp: , Rfl:  .  ibuprofen (ADVIL,MOTRIN) 200 MG tablet, Take 200 mg by mouth every 6 (six) hours as needed. For pain, Disp: , Rfl:  .  meloxicam (MOBIC) 7.5 MG tablet, Take 7.5 mg by mouth daily., Disp: , Rfl:  .  Methotrexate, PF, 25 MG/0.5ML SOAJ, 0.76m once weekly, Disp: 0.5 mL, Rfl: 11 .  Multiple Vitamin (MULTIVITAMIN) tablet, Take 1 tablet by mouth daily.  , Disp: , Rfl:  .  Nutritional Supplements (ESTROVEN ENERGY PO), Take by mouth daily., Disp: , Rfl:  .  omeprazole (PRILOSEC) 20 MG capsule, Take 20 mg by mouth daily., Disp: , Rfl:  .  oxyCODONE (OXY IR/ROXICODONE) 5 MG immediate release tablet, 1-2 tabs every 6 hours as needed, Disp: 240 tablet, Rfl: 0 .  sertraline (ZOLOFT) 100 MG tablet, TAKE 1 TABLET BY MOUTH ONCE DAILY, Disp: 90 tablet, Rfl: 1 .  traZODone (DESYREL) 50 MG tablet, TAKE 1 TO 2 TABLETS BY MOUTH AT BEDTIME, Disp: 180 tablet, Rfl: 1 .  valACYclovir (VALTREX) 1000 MG tablet, TAKE TWO TABLETS BY MOUTH TWICE DAILY FOR 5 DAYS AS NEEDED, Disp: 40 tablet, Rfl: 3 .  Venlafaxine HCl 75 MG TB24, TAKE 1 TABLET BY MOUTH THREE TIMES DAILY (Patient taking differently: TAKE 3 TABLET BY MOUTH DAILY), Disp: 270 tablet, Rfl: 3 .  amoxicillin-clavulanate (AUGMENTIN) 875-125 MG tablet, Take 1 tablet by mouth 2 (two) times daily. (Patient not taking: Reported on 09/07/2017), Disp: 20 tablet, Rfl: 0 .  cyclobenzaprine (FLEXERIL) 10 MG tablet, Take 1 tablet (10 mg total) by mouth 3 (three) times daily as needed for muscle spasms. (Patient not taking: Reported on 09/07/2017), Disp: 90 tablet, Rfl: 0 .  varenicline (CHANTIX CONTINUING MONTH PAK) 1 MG tablet, Take 1 tablet (1 mg total) by mouth 2 (two) times daily. (Patient not taking: Reported on 08/02/2017), Disp: 60 tablet, Rfl: 4  Current Facility-Administered  Medications:  .  ipratropium-albuterol (DUONEB) 0.5-2.5 (3) MG/3ML nebulizer solution 3 mL, 3 mL, Nebulization, Once, [place], Adriana M, PA-C  Imaging Review  Cervical Imaging:  Cervical CT w contrast:  Results for orders placed during the hospital encounter of 10/22/16  CT CERVICAL SPINE W CONTRAST   Narrative CLINICAL DATA:  Central posterior neck pain extending to the right shoulder. Numbness in the left hand. Low back pain and numbness extending into the lower extremities bilaterally, right greater than left.  FLUOROSCOPY TIME:  Radiation Exposure Index (as provided by the fluoroscopic device): 515.01 uGy*m2  If the device does not provide the exposure index:Fluoroscopy Time: 1 minutes 29 seconds  Number of Acquired Images:  23  PROCEDURE: LUMBAR PUNCTURE FOR CERVICAL AND LUMBAR MYELOGRAM  CERVICAL AND LUMBAR MYELOGRAM  CT CERVICAL MYELOGRAM  CT LUMBAR  MYELOGRAM  After thorough discussion of risks and benefits of the procedure including bleeding, infection, injury to nerves, blood vessels, adjacent structures as well as headache and CSF leak, written and oral informed consent was obtained. Consent was obtained by Dr. San Morelle.  Patient was positioned prone on the fluoroscopy table. Local anesthesia was provided with 1% lidocaine without epinephrine after prepped and draped in the usual sterile fashion. Puncture was performed at L2-3 using a 3 1/2 inch 22-gauge spinal needle via left paramedian approach. Using a single pass through the dura, the needle was placed within the thecal sac, with return of clear CSF. 10 mL of Isovue-M 300 was injected into the thecal sac, with normal opacification of the nerve roots and cauda equina consistent with free flow within the subarachnoid space. The patient was then moved to the trendelenburg position and contrast flowed into the Cervical spine region.  I personally performed the lumbar puncture and administered  the intrathecal contrast. I also personally supervised acquisition of the myelogram images.  TECHNIQUE: Contiguous axial images were obtained through the Cervical and Lumbar spine after the intrathecal infusion of infusion. Coronal and sagittal reconstructions were obtained of the axial image sets.  FINDINGS: CERVICAL AND LUMBAR MYELOGRAM FINDINGS:  A a broad-based disc protrusion is present at L5-S1 with subarticular narrowing bilaterally, left greater than right. There is early truncation of the L5 nerve roots, also worse on the left. The distal S1 nerve roots are normal. Grade 1 anterolisthesis is present at L4-5 with uncovering of a broad-based disc protrusion. Minimal subarticular narrowing is present. Mild disc bulging is present at L3-4.  There is no significant change in the anterolisthesis at L4-5 with standing, flexion, or extension. The disc protrusion at L3-4 is exaggerated with standing.  Contrast is poorly seen within the cervical spine. Fusion is solid. Cervical fusion is evident C3-4 through T1-2. AP alignment is present.  CT CERVICAL MYELOGRAM FINDINGS:  Cervical spine is imaged from the skullbase through T2-3.  Solid fusion at C3-4, C4-5, and C5-6 is again noted. There is lucency surrounding disc spacer at C6-7 without evidence of bridging bone. There is bridging bone across the C7-T1 level. The plate is separated from the T1 vertebral body anteriorly without significant interval change.  The soft tissues of the neck are otherwise unremarkable.  C2-3: A leftward disc osteophyte complex and asymmetric facet spurring results in mild left central and moderate left foraminal stenosis.  C3-4: Left-sided uncovertebral and facet disease results in moderate low foraminal narrowing.  C4-5: Solid anterior fusion is present. No residual recurrent stenosis is evident.  C5-6: Solid anterior fusion is present. Mild facet hypertrophy is noted. There is no  residual recurrent stenosis.  C6-7: There is no bridging bone. Lucency remains across the disc spacer. Facet hypertrophy and uncovertebral spurring contribute to moderate residual foraminal stenosis left greater than right.  C7-T1: There is bridging bone across the disc space. Facet hypertrophy contributes to mild foraminal narrowing bilaterally, left greater than right.  The lung apices are clear.  CT LUMBAR MYELOGRAM FINDINGS:  The lumbar spine is imaged from the midbody of T12 through S3-4. Slight anterolisthesis at L4-5 is reduced in the supine position. AP alignment is otherwise anatomic. Vertebral body heights are normal. Mild rightward curvature is centered at L1. There is leftward curvature at L5-S1.  Atherosclerotic calcifications are present within the aorta without aneurysm. No other focal lesions are present. There is no significant adenopathy.  L1-2:  Negative.  L2-3:  Negative.  L3-4:  A broad-based disc protrusion is present. Moderate facet hypertrophy and ligamentum flavum thickening is present this results in mild subarticular narrowing bilaterally. The foramina are patent.  L4-5: A broad-based disc protrusion is present. Moderate facet hypertrophy and ligamentum flavum thickening is noted. Moderate subarticular narrowing is present bilaterally. Mild foraminal narrowing is present bilaterally.  L5-S1: There is chronic loss of disc height. A vacuum disc is present. A broad-based disc protrusion is present. Facet spurring is worse on the right. Moderate left and mild right subarticular narrowing is present. Moderate foraminal stenosis is present bilaterally.  IMPRESSION: 1. Moderate left and mild right subarticular narrowing at L5-S1 with moderate foraminal stenosis bilaterally. 2. Moderate subarticular and mild foraminal stenosis bilaterally at L4-5. This is more evident on CT than myelogram. Anterolisthesis is present on the myelogram but reduced in the  prone position on CT. 3. Broad-based disc protrusion with mild subarticular narrowing bilaterally at L3-4. 4. Solid fusion of the cervical spine at C3-4, C4-5, and C5-6. 5. Residual broad-based disc protrusion and facet hypertrophy with moderate foraminal narrowing bilaterally, left greater than right at C6-7. 6. Solid fusion at C7-T1 with mild residual foraminal stenosis bilaterally due to facet spurring. 7. Leftward disc osteophyte complex and facet spurring at C2-3 with mild left subarticular and moderate left foraminal stenosis.   Electronically Signed   By: San Morelle M.D.   On: 10/22/2016 15:36    Cervical DG Myelogram views:  Results for orders placed during the hospital encounter of 12/20/12  DG Myelogram Cervical   Narrative *RADIOLOGY REPORT*  Clinical Data:  Recurrent neck pain.  Left upper extremity radiculitis.  The patient's symptoms initially resolved after spine surgery in 2011.  The current symptoms have occurred since.  MYELOGRAM INJECTION  Technique:  Informed consent was obtained from the patient prior to the procedure, including potential complications of headache, allergy, infection and pain.  A timeout procedure was performed. With the patient prone, the lower back was prepped with Betadine. 1% Lidocaine was used for local anesthesia.  Lumbar puncture was performed at the right paramidline L1-2 level using a 22 gauge needle with return of clear CSF.  10 ml of Omnipaque 300was injected into the subarachnoid space .  IMPRESSION: Successful injection of  intrathecal contrast for myelography.  MYELOGRAM CERVICAL  Technique: I personally performed the lumbar puncture and administered the intrathecal contrast. I also personally supervised acquisition of the myelogram images. Following injection of intrathecal Omnipaque contrast, spine imaging in multiple projections was performed using fluoroscopy.  Fluoroscopy Time: 1 minute 37  seconds  Comparison:  MRI of the cervical spine 01/14/2010  Findings: The patient is now status post ACDF at C4-5 and C5-6. The fusion appears solid at these levels.  Disc osteophyte complex is evident at C6-7 with some blunting of the nerve roots bilaterally.  There is also disc osteophyte complex at C3-4 with right greater than left uncovertebral disease.  Anterolisthesis is present at to C3-4 upon standing.  This is exaggerated with flexion and partially reduced in extension.  More mild dynamic anterolisthesis is present at C2-3.  IMPRESSION:  1.  Status post anterior fusion at C4-5 and C5-6 without abnormal motion. 2.  Disc osteophyte complex at C6-7 with bilateral nerve root impact. 3.  Dynamic anterolisthesis at C3-4 and to a lesser extent at C2-3.  *RADIOLOGY REPORT*  CT MYELOGRAPHY CERVICAL SPINE  Technique:  CT imaging of the cervical spine was performed after intrathecal contrast administration. Multiplanar CT image reconstructions were also generated.  Findings:  The patient is status post ACDF at C4-5 and C5-6. Fusion is solid both levels.  Slight anterolisthesis is evident at C3-4.  Alignment is otherwise anatomic.  The soft tissues of the neck are unremarkable.  The lung apices are clear.  C2-3:  A broad-based disc osteophyte complex is asymmetric to the left.  Asymmetric left-sided uncovertebral spurring is noted as well.  Mild left foraminal narrowing is stable.  C3-4:  A broad-based disc osteophyte complex is present.  Bilateral facet hypertrophy has progressed, left greater than right. Uncovertebral spurring has worsened as well.  Mild to moderate central and moderate foraminal stenosis is present bilaterally.  C4-5:  The patient is fused anteriorly.  No residual disc herniation or stenosis is evident.  C5-6:  The patient is fused anteriorly.  There is no residual stenosis.  C6-7:  A broad-based disc osteophyte complex is present.  Mild  to moderate left and mild right foraminal narrowing is evident.  There is partial effacement of the ventral CSF.  C7-T1:  End plate spurring is evident.  There is chronic loss of disc height.  No significant stenosis is present.  IMPRESSION:  1.  Status post ACDF at C4-5 and C5-6 without evidence for residual or recurrent stenosis. 2.  Mild to moderate left and mild right foraminal narrowing at C6- 7 secondary to a broad-based disc osteophyte complex and uncovertebral spurring. 3.  Progressive facet hypertrophy and uncovertebral disease at C3-4 with mild to moderate central and moderate foraminal stenosis bilaterally. 4.  Mild left foraminal narrowing at C2-3. 5.  Endplate spurring at E2-A8 without significant stenosis.   Original Report Authenticated By: San Morelle, M.D.    Cervical DG Myelogram views:  Results for orders placed during the hospital encounter of 04/04/15  DG MYELOGRAPHY LUMBAR INJ CERVICAL   Narrative CLINICAL DATA:  Neck pain. Pain into the upper extremities. Tingling into the arms and bilateral hands, worse on the right. Symptoms were relieved by the most recent surgery, but have recurred.  FLUOROSCOPY TIME:  Fluoroscopy Time:  1 minutes 24 seconds  Number of Acquired Images:  12  PROCEDURE: LUMBAR PUNCTURE FOR CERVICAL MYELOGRAM  After thorough discussion of risks and benefits of the procedure including bleeding, infection, injury to nerves, blood vessels, adjacent structures as well as headache and CSF leak, written and oral informed consent was obtained. Consent was obtained by Dr. San Morelle. We discussed the high likelihood of obtaining a diagnostic study.  Patient was positioned prone on the fluoroscopy table. Local anesthesia was provided with 1% lidocaine without epinephrine after prepped and draped in the usual sterile fashion. Puncture was performed at L3-4 using a 3 1/2 inch 22-gauge spinal needle via a left paramedian  approach. Using a single pass through the dura, the needle was placed within the thecal sac, with return of clear CSF. 10 mL of Omnipaque-300 was injected into the thecal sac, with normal opacification of the nerve roots and cauda equina consistent with free flow within the subarachnoid space. The patient was then moved to the trendelenburg position and contrast flowed into the Cervical spine region.  I personally performed the lumbar puncture and administered the intrathecal contrast. I also personally supervised acquisition of the myelogram images.  TECHNIQUE: Contiguous axial images were obtained through the Cervical spine after the intrathecal infusion of infusion. Coronal and sagittal reconstructions were obtained of the axial image sets.  FINDINGS: CERVICAL MYELOGRAM FINDINGS:  The anterior fusion has been extended to include C3-4. No significant disc disease is evident  at C2-3. A prominent disc osteophyte complex at C6-7 has progressed. There is no abnormal motion with flexion or extension.  CT CERVICAL MYELOGRAM FINDINGS:  The cervical spine is imaged from the skullbase through T3-4. Anterior fusion is solid at C3-4, C4-5, and C5-6. Vertebral body heights alignment are maintained.  C2-3: A mild leftward disc osteophyte complex and left-sided facet hypertrophy is similar to the prior study. There is no significant stenosis.  C3-4: Anterior fusion is now present. The central canal is decompressed. Mild left foraminal narrowing is improved. There is some uncovertebral and facet disease. The posterior elements are now fused bilaterally.  C4-5: Anterior fusion is solid. No residual or recurrent stenosis is present.  C5-6: Anterior fusion is solid. No residual or recurrent stenosis is present.  C6-7: A broad-based disc osteophyte complex is present. There is partial effacement of ventral CSF. Moderate foraminal narrowing bilaterally demonstrate some progression,  still worse left than right.  C7-T1: Central osteophytes are present without significant central canal stenosis. There is some uncovertebral disease without significant change or stenosis.  IMPRESSION: 1. Extension of anterior fusion to include C3-4. The central canal is decompressed. Mild left foraminal narrowing is improved. 2. Solid anterior fusion at C4-5 and C5-6 without residual or recurrent stenosis. 3. Slight progression of broad-based disc osteophyte complex with partial effacement of the ventral CSF and moderate foraminal narrowing, left greater than right. 4. Mild central disc osteophyte and bilateral uncovertebral spurring at C7-T1 without significant stenosis.   Electronically Signed   By: San Morelle M.D.   On: 04/04/2015 13:27     Results for orders placed during the hospital encounter of 03/10/16  CT THORACIC SPINE W CONTRAST   Narrative CLINICAL DATA:  Cervical spondylosis without myelopathy. Neck pain, mid to upper back pain. Pain extends to both shoulders. FLUOROSCOPY TIME:  50 seconds corresponding to a Dose Area Product of 170.4 Gy*m2 PROCEDURE: LUMBAR PUNCTURE FOR CERVICAL AND THORACIC MYELOGRAM After thorough discussion of risks and benefits of the procedure including bleeding, infection, injury to nerves, blood vessels, adjacent structures as well as headache and CSF leak, written and oral informed consent was obtained. Consent was obtained by Dr. Rolla Flatten. Patient was positioned prone on the fluoroscopy table. Local anesthesia was provided with 1% lidocaine without epinephrine after prepped and draped in the usual sterile fashion. Puncture was performed at L3-L4 using a 3 1/2 inch 22-gauge spinal needle via midline approach. Using a single pass through the dura, the needle was placed within the thecal sac, with return of clear CSF. 10 mL of Isovue-M 300 was injected into the thecal sac, with normal opacification of the nerve roots and  cauda equina consistent with free flow within the subarachnoid space. The patient was then moved to the trendelenburg position and contrast flowed into the Thoracic spine region. I personally performed the lumbar puncture and administered the intrathecal contrast. I also personally supervised acquisition of the myelogram images. TECHNIQUE: Contiguous axial images were obtained through the Cervical and Thoracic spine after the intrathecal infusion of infusion. Coronal and sagittal reconstructions were obtained of the axial image sets. FINDINGS: CERVICAL AND THORACIC MYELOGRAM FINDINGS: Cervical: There is poor opacification of the cervical subarachnoid space due to difficulty with transit of the bolus. There has been previous ACDF at C4-6 with subsequent C3-4 ACDF. All levels appears solid. No spinal stenosis or cord compression. Disc osteophyte complex at C7-T1. No abnormal motion with flexion extension. Thoracic: Ventral defects are seen at T5-6, T8-9, and T11-12. No thoracic  compression fracture. No visible intradural mass lesion. Cord size normal throughout except for flattening at T8-9. CT CERVICAL MYELOGRAM FINDINGS: Alignment: Anatomic Vertebrae: Solid C3-C6 fusion. No malposition of hardware or fracture. Cord: No cord compression. Posterior Fossa: No tonsillar herniation. Vertebral Arteries: Not assessed. Paraspinal tissues: Atherosclerosis. Disc levels: The individual disc spaces were examined as follows: C2-3: LEFT-sided uncinate spurring and facet arthropathy potentially affecting the LEFT C3 nerve. C3-4:  Solid fusion.  No impingement. C4-5:  Solid fusion.  No impingement. C5-6:  Solid fusion.  No impingement. C6-7: Central protrusion. BILATERAL foraminal narrowing due to uncinate spurring could affect either C7 nerve root, but worse on the LEFT. C7-T1: Disc osteophyte complex centrally. BILATERAL facet arthropathy. BILATERAL foraminal narrowing could affect either  C8 nerve root. Compared to the prior myelogram in August 2016, similar appearance. CT THORACIC MYELOGRAM FINDINGS: Coarse trabecular markings suggesting osteopenia. No visible pulmonary mass or nodule. No mediastinal lesion. Platelike atelectasis LEFT base. No compression fracture. The individual disc spaces are examined as follows: T1-2:  Normal. T2-3:  Bulge.  No impingement. T3-4:  Bulge.  No impingement. T4-5:  Bulge.  No impingement. T5-6:  Leftward protrusion.  No definite impingement. T6-7:  Bulge.  No impingement. T7-8: Central protrusion. Slight effacement anterior subarachnoid space without significant stenosis or cord flattening. T8-9: Central extrusion. Moderate cord flattening. Correlate clinically for symptomatic myelopathy. T9-10:  Bulge.  No impingement. T10-11:  Unremarkable. T11-12:  Central and rightward protrusion.  No definite impingement. T12-L1: Bulge.  No impingement. IMPRESSION: Solid C3 through C6 ACDF. Moderate adjacent segment disease at C6-7 and C7-T1, but stable from August 2016. No dynamic instability. Multilevel thoracic degenerative disc disease, with the most significant abnormality at T8-9 where a central disc extrusion results in moderate cord flattening. Electronically Signed   By: Staci Righter M.D.   On: 03/10/2016 14:44    Lumbar CT w contrast:  Results for orders placed during the hospital encounter of 10/22/16  CT LUMBAR SPINE W CONTRAST   Narrative CLINICAL DATA:  Central posterior neck pain extending to the right shoulder. Numbness in the left hand. Low back pain and numbness extending into the lower extremities bilaterally, right greater than left.  FLUOROSCOPY TIME:  Radiation Exposure Index (as provided by the fluoroscopic device): 515.01 uGy*m2  If the device does not provide the exposure index:Fluoroscopy Time: 1 minutes 29 seconds  Number of Acquired Images:  23  PROCEDURE: LUMBAR PUNCTURE FOR CERVICAL AND LUMBAR  MYELOGRAM  CERVICAL AND LUMBAR MYELOGRAM  CT CERVICAL MYELOGRAM  CT LUMBAR MYELOGRAM  After thorough discussion of risks and benefits of the procedure including bleeding, infection, injury to nerves, blood vessels, adjacent structures as well as headache and CSF leak, written and oral informed consent was obtained. Consent was obtained by Dr. San Morelle.  Patient was positioned prone on the fluoroscopy table. Local anesthesia was provided with 1% lidocaine without epinephrine after prepped and draped in the usual sterile fashion. Puncture was performed at L2-3 using a 3 1/2 inch 22-gauge spinal needle via left paramedian approach. Using a single pass through the dura, the needle was placed within the thecal sac, with return of clear CSF. 10 mL of Isovue-M 300 was injected into the thecal sac, with normal opacification of the nerve roots and cauda equina consistent with free flow within the subarachnoid space. The patient was then moved to the trendelenburg position and contrast flowed into the Cervical spine region.  I personally performed the lumbar puncture and administered the intrathecal contrast. I  also personally supervised acquisition of the myelogram images.  TECHNIQUE: Contiguous axial images were obtained through the Cervical and Lumbar spine after the intrathecal infusion of infusion. Coronal and sagittal reconstructions were obtained of the axial image sets.  FINDINGS: CERVICAL AND LUMBAR MYELOGRAM FINDINGS:  A a broad-based disc protrusion is present at L5-S1 with subarticular narrowing bilaterally, left greater than right. There is early truncation of the L5 nerve roots, also worse on the left. The distal S1 nerve roots are normal. Grade 1 anterolisthesis is present at L4-5 with uncovering of a broad-based disc protrusion. Minimal subarticular narrowing is present. Mild disc bulging is present at L3-4.  There is no significant change in the  anterolisthesis at L4-5 with standing, flexion, or extension. The disc protrusion at L3-4 is exaggerated with standing.  Contrast is poorly seen within the cervical spine. Fusion is solid. Cervical fusion is evident C3-4 through T1-2. AP alignment is present.  CT CERVICAL MYELOGRAM FINDINGS:  Cervical spine is imaged from the skullbase through T2-3.  Solid fusion at C3-4, C4-5, and C5-6 is again noted. There is lucency surrounding disc spacer at C6-7 without evidence of bridging bone. There is bridging bone across the C7-T1 level. The plate is separated from the T1 vertebral body anteriorly without significant interval change.  The soft tissues of the neck are otherwise unremarkable.  C2-3: A leftward disc osteophyte complex and asymmetric facet spurring results in mild left central and moderate left foraminal stenosis.  C3-4: Left-sided uncovertebral and facet disease results in moderate low foraminal narrowing.  C4-5: Solid anterior fusion is present. No residual recurrent stenosis is evident.  C5-6: Solid anterior fusion is present. Mild facet hypertrophy is noted. There is no residual recurrent stenosis.  C6-7: There is no bridging bone. Lucency remains across the disc spacer. Facet hypertrophy and uncovertebral spurring contribute to moderate residual foraminal stenosis left greater than right.  C7-T1: There is bridging bone across the disc space. Facet hypertrophy contributes to mild foraminal narrowing bilaterally, left greater than right.  The lung apices are clear.  CT LUMBAR MYELOGRAM FINDINGS:  The lumbar spine is imaged from the midbody of T12 through S3-4. Slight anterolisthesis at L4-5 is reduced in the supine position. AP alignment is otherwise anatomic. Vertebral body heights are normal. Mild rightward curvature is centered at L1. There is leftward curvature at L5-S1.  Atherosclerotic calcifications are present within the aorta without aneurysm. No  other focal lesions are present. There is no significant adenopathy.  L1-2:  Negative.  L2-3:  Negative.  L3-4: A broad-based disc protrusion is present. Moderate facet hypertrophy and ligamentum flavum thickening is present this results in mild subarticular narrowing bilaterally. The foramina are patent.  L4-5: A broad-based disc protrusion is present. Moderate facet hypertrophy and ligamentum flavum thickening is noted. Moderate subarticular narrowing is present bilaterally. Mild foraminal narrowing is present bilaterally.  L5-S1: There is chronic loss of disc height. A vacuum disc is present. A broad-based disc protrusion is present. Facet spurring is worse on the right. Moderate left and mild right subarticular narrowing is present. Moderate foraminal stenosis is present bilaterally.  IMPRESSION: 1. Moderate left and mild right subarticular narrowing at L5-S1 with moderate foraminal stenosis bilaterally. 2. Moderate subarticular and mild foraminal stenosis bilaterally at L4-5. This is more evident on CT than myelogram. Anterolisthesis is present on the myelogram but reduced in the prone position on CT. 3. Broad-based disc protrusion with mild subarticular narrowing bilaterally at L3-4. 4. Solid fusion of the cervical spine at C3-4,  C4-5, and C5-6. 5. Residual broad-based disc protrusion and facet hypertrophy with moderate foraminal narrowing bilaterally, left greater than right at C6-7. 6. Solid fusion at C7-T1 with mild residual foraminal stenosis bilaterally due to facet spurring. 7. Leftward disc osteophyte complex and facet spurring at C2-3 with mild left subarticular and moderate left foraminal stenosis.   Electronically Signed   By: San Morelle M.D.   On: 10/22/2016 15:36     Lumbar DG Myelogram:  Results for orders placed during the hospital encounter of 04/04/15  DG MYELOGRAPHY LUMBAR INJ CERVICAL   Narrative CLINICAL DATA:  Neck pain. Pain into  the upper extremities. Tingling into the arms and bilateral hands, worse on the right. Symptoms were relieved by the most recent surgery, but have recurred.  FLUOROSCOPY TIME:  Fluoroscopy Time:  1 minutes 24 seconds  Number of Acquired Images:  12  PROCEDURE: LUMBAR PUNCTURE FOR CERVICAL MYELOGRAM  After thorough discussion of risks and benefits of the procedure including bleeding, infection, injury to nerves, blood vessels, adjacent structures as well as headache and CSF leak, written and oral informed consent was obtained. Consent was obtained by Dr. San Morelle. We discussed the high likelihood of obtaining a diagnostic study.  Patient was positioned prone on the fluoroscopy table. Local anesthesia was provided with 1% lidocaine without epinephrine after prepped and draped in the usual sterile fashion. Puncture was performed at L3-4 using a 3 1/2 inch 22-gauge spinal needle via a left paramedian approach. Using a single pass through the dura, the needle was placed within the thecal sac, with return of clear CSF. 10 mL of Omnipaque-300 was injected into the thecal sac, with normal opacification of the nerve roots and cauda equina consistent with free flow within the subarachnoid space. The patient was then moved to the trendelenburg position and contrast flowed into the Cervical spine region.  I personally performed the lumbar puncture and administered the intrathecal contrast. I also personally supervised acquisition of the myelogram images.  TECHNIQUE: Contiguous axial images were obtained through the Cervical spine after the intrathecal infusion of infusion. Coronal and sagittal reconstructions were obtained of the axial image sets.  FINDINGS: CERVICAL MYELOGRAM FINDINGS:  The anterior fusion has been extended to include C3-4. No significant disc disease is evident at C2-3. A prominent disc osteophyte complex at C6-7 has progressed. There is no  abnormal motion with flexion or extension.  CT CERVICAL MYELOGRAM FINDINGS:  The cervical spine is imaged from the skullbase through T3-4. Anterior fusion is solid at C3-4, C4-5, and C5-6. Vertebral body heights alignment are maintained.  C2-3: A mild leftward disc osteophyte complex and left-sided facet hypertrophy is similar to the prior study. There is no significant stenosis.  C3-4: Anterior fusion is now present. The central canal is decompressed. Mild left foraminal narrowing is improved. There is some uncovertebral and facet disease. The posterior elements are now fused bilaterally.  C4-5: Anterior fusion is solid. No residual or recurrent stenosis is present.  C5-6: Anterior fusion is solid. No residual or recurrent stenosis is present.  C6-7: A broad-based disc osteophyte complex is present. There is partial effacement of ventral CSF. Moderate foraminal narrowing bilaterally demonstrate some progression, still worse left than right.  C7-T1: Central osteophytes are present without significant central canal stenosis. There is some uncovertebral disease without significant change or stenosis.  IMPRESSION: 1. Extension of anterior fusion to include C3-4. The central canal is decompressed. Mild left foraminal narrowing is improved. 2. Solid anterior fusion at C4-5 and C5-6  without residual or recurrent stenosis. 3. Slight progression of broad-based disc osteophyte complex with partial effacement of the ventral CSF and moderate foraminal narrowing, left greater than right. 4. Mild central disc osteophyte and bilateral uncovertebral spurring at C7-T1 without significant stenosis.   Electronically Signed   By: San Morelle M.D.   On: 04/04/2015 13:27    Lumbar DG Myelogram:  Results for orders placed during the hospital encounter of 10/22/16  DG MYELOGRAPHY LUMBAR INJ [place]   Narrative CLINICAL DATA:  Central posterior neck pain extending to the  right shoulder. Numbness in the left hand. Low back pain and numbness extending into the lower extremities bilaterally, right greater than left.  FLUOROSCOPY TIME:  Radiation Exposure Index (as provided by the fluoroscopic device): 515.01 uGy*m2  If the device does not provide the exposure index:Fluoroscopy Time: 1 minutes 29 seconds  Number of Acquired Images:  23  PROCEDURE: LUMBAR PUNCTURE FOR CERVICAL AND LUMBAR MYELOGRAM  CERVICAL AND LUMBAR MYELOGRAM  CT CERVICAL MYELOGRAM  CT LUMBAR MYELOGRAM  After thorough discussion of risks and benefits of the procedure including bleeding, infection, injury to nerves, blood vessels, adjacent structures as well as headache and CSF leak, written and oral informed consent was obtained. Consent was obtained by Dr. San Morelle.  Patient was positioned prone on the fluoroscopy table. Local anesthesia was provided with 1% lidocaine without epinephrine after prepped and draped in the usual sterile fashion. Puncture was performed at L2-3 using a 3 1/2 inch 22-gauge spinal needle via left paramedian approach. Using a single pass through the dura, the needle was placed within the thecal sac, with return of clear CSF. 10 mL of Isovue-M 300 was injected into the thecal sac, with normal opacification of the nerve roots and cauda equina consistent with free flow within the subarachnoid space. The patient was then moved to the trendelenburg position and contrast flowed into the Cervical spine region.  I personally performed the lumbar puncture and administered the intrathecal contrast. I also personally supervised acquisition of the myelogram images.  TECHNIQUE: Contiguous axial images were obtained through the Cervical and Lumbar spine after the intrathecal infusion of infusion. Coronal and sagittal reconstructions were obtained of the axial image sets.  FINDINGS: CERVICAL AND LUMBAR MYELOGRAM FINDINGS:  A a broad-based disc  protrusion is present at L5-S1 with subarticular narrowing bilaterally, left greater than right. There is early truncation of the L5 nerve roots, also worse on the left. The distal S1 nerve roots are normal. Grade 1 anterolisthesis is present at L4-5 with uncovering of a broad-based disc protrusion. Minimal subarticular narrowing is present. Mild disc bulging is present at L3-4.  There is no significant change in the anterolisthesis at L4-5 with standing, flexion, or extension. The disc protrusion at L3-4 is exaggerated with standing.  Contrast is poorly seen within the cervical spine. Fusion is solid. Cervical fusion is evident C3-4 through T1-2. AP alignment is present.  CT CERVICAL MYELOGRAM FINDINGS:  Cervical spine is imaged from the skullbase through T2-3.  Solid fusion at C3-4, C4-5, and C5-6 is again noted. There is lucency surrounding disc spacer at C6-7 without evidence of bridging bone. There is bridging bone across the C7-T1 level. The plate is separated from the T1 vertebral body anteriorly without significant interval change.  The soft tissues of the neck are otherwise unremarkable.  C2-3: A leftward disc osteophyte complex and asymmetric facet spurring results in mild left central and moderate left foraminal stenosis.  C3-4: Left-sided uncovertebral and facet disease  results in moderate low foraminal narrowing.  C4-5: Solid anterior fusion is present. No residual recurrent stenosis is evident.  C5-6: Solid anterior fusion is present. Mild facet hypertrophy is noted. There is no residual recurrent stenosis.  C6-7: There is no bridging bone. Lucency remains across the disc spacer. Facet hypertrophy and uncovertebral spurring contribute to moderate residual foraminal stenosis left greater than right.  C7-T1: There is bridging bone across the disc space. Facet hypertrophy contributes to mild foraminal narrowing bilaterally, left greater than right.  The  lung apices are clear.  CT LUMBAR MYELOGRAM FINDINGS:  The lumbar spine is imaged from the midbody of T12 through S3-4. Slight anterolisthesis at L4-5 is reduced in the supine position. AP alignment is otherwise anatomic. Vertebral body heights are normal. Mild rightward curvature is centered at L1. There is leftward curvature at L5-S1.  Atherosclerotic calcifications are present within the aorta without aneurysm. No other focal lesions are present. There is no significant adenopathy.  L1-2:  Negative.  L2-3:  Negative.  L3-4: A broad-based disc protrusion is present. Moderate facet hypertrophy and ligamentum flavum thickening is present this results in mild subarticular narrowing bilaterally. The foramina are patent.  L4-5: A broad-based disc protrusion is present. Moderate facet hypertrophy and ligamentum flavum thickening is noted. Moderate subarticular narrowing is present bilaterally. Mild foraminal narrowing is present bilaterally.  L5-S1: There is chronic loss of disc height. A vacuum disc is present. A broad-based disc protrusion is present. Facet spurring is worse on the right. Moderate left and mild right subarticular narrowing is present. Moderate foraminal stenosis is present bilaterally.  IMPRESSION: 1. Moderate left and mild right subarticular narrowing at L5-S1 with moderate foraminal stenosis bilaterally. 2. Moderate subarticular and mild foraminal stenosis bilaterally at L4-5. This is more evident on CT than myelogram. Anterolisthesis is present on the myelogram but reduced in the prone position on CT. 3. Broad-based disc protrusion with mild subarticular narrowing bilaterally at L3-4. 4. Solid fusion of the cervical spine at C3-4, C4-5, and C5-6. 5. Residual broad-based disc protrusion and facet hypertrophy with moderate foraminal narrowing bilaterally, left greater than right at C6-7. 6. Solid fusion at C7-T1 with mild residual foraminal  stenosis bilaterally due to facet spurring. 7. Leftward disc osteophyte complex and facet spurring at C2-3 with mild left subarticular and moderate left foraminal stenosis.   Electronically Signed   By: San Morelle M.D.   On: 10/22/2016 15:36    Lumbar DG Epidurogram OP:  Results for orders placed during the hospital encounter of 04/10/10  DG Epidurography   Narrative EPIDUROGRAM S+I   Clinical Data: Displacement of cervical intervertebral disc without myelopathy.   Procedure: Right C7-T1 Cervical Interlaminar Epidural Steroid Injection   Fully informed written consent was obtained on the first visit. The patient was placed prone on the fluoroscopic table, the C7-T1 interspace was visualized, and the skin was marked.  After thorough povidone-iodine scrubbing, the site was draped in sterile fashion. 1% lidocaine was used to anesthetize the skin and deeper soft tissues.  A 20-gauge Crawford epidural needle was inserted and advanced into the epidural space using loss of resistance technique with normal saline.  The epidural space was identified on first pass without evidence of blood, cerebrospinal fluid, or paresthesias.  Needle tip placement within the epidural space was confirmed using 1 mL of non-ionic contrast.  There was no intravascular or subarachnoid opacification.  Next, 60 mg of Kenalog was administered and flushed using 2 mL of normal saline. The needle was restyletted  and withdrawn.  The patient tolerated the procedure well and there was no evidence of procedural complication.   The patient was able to ambulate to the recovery area and observed for an appropriate length of time as outlined on the nursing notes. The patient was discharged home with a driver in good condition with detailed patient instructions.   Fluoroscopy time:  24 seconds   IMPRESSION:   Satisfactory interlaminar cervical epidural steroid injection, or right C7-T1.  Provider:  Alfredo Batty    Spine Imaging: Whole Spine DG Myelogram views:  Results for orders placed during the hospital encounter of 10/22/16  DG [place]   Narrative CLINICAL DATA:  Central posterior neck pain extending to the right shoulder. Numbness in the left hand. Low back pain and numbness extending into the lower extremities bilaterally, right greater than left.  FLUOROSCOPY TIME:  Radiation Exposure Index (as provided by the fluoroscopic device): 515.01 uGy*m2  If the device does not provide the exposure index:Fluoroscopy Time: 1 minutes 29 seconds  Number of Acquired Images:  23  PROCEDURE: LUMBAR PUNCTURE FOR CERVICAL AND LUMBAR MYELOGRAM  CERVICAL AND LUMBAR MYELOGRAM  CT CERVICAL MYELOGRAM  CT LUMBAR MYELOGRAM  After thorough discussion of risks and benefits of the procedure including bleeding, infection, injury to nerves, blood vessels, adjacent structures as well as headache and CSF leak, written and oral informed consent was obtained. Consent was obtained by Dr. San Morelle.  Patient was positioned prone on the fluoroscopy table. Local anesthesia was provided with 1% lidocaine without epinephrine after prepped and draped in the usual sterile fashion. Puncture was performed at L2-3 using a 3 1/2 inch 22-gauge spinal needle via left paramedian approach. Using a single pass through the dura, the needle was placed within the thecal sac, with return of clear CSF. 10 mL of Isovue-M 300 was injected into the thecal sac, with normal opacification of the nerve roots and cauda equina consistent with free flow within the subarachnoid space. The patient was then moved to the trendelenburg position and contrast flowed into the Cervical spine region.  I personally performed the lumbar puncture and administered the intrathecal contrast. I also personally supervised acquisition of the myelogram images.  TECHNIQUE: Contiguous axial images  were obtained through the Cervical and Lumbar spine after the intrathecal infusion of infusion. Coronal and sagittal reconstructions were obtained of the axial image sets.  FINDINGS: CERVICAL AND LUMBAR MYELOGRAM FINDINGS:  A a broad-based disc protrusion is present at L5-S1 with subarticular narrowing bilaterally, left greater than right. There is early truncation of the L5 nerve roots, also worse on the left. The distal S1 nerve roots are normal. Grade 1 anterolisthesis is present at L4-5 with uncovering of a broad-based disc protrusion. Minimal subarticular narrowing is present. Mild disc bulging is present at L3-4.  There is no significant change in the anterolisthesis at L4-5 with standing, flexion, or extension. The disc protrusion at L3-4 is exaggerated with standing.  Contrast is poorly seen within the cervical spine. Fusion is solid. Cervical fusion is evident C3-4 through T1-2. AP alignment is present.  CT CERVICAL MYELOGRAM FINDINGS:  Cervical spine is imaged from the skullbase through T2-3.  Solid fusion at C3-4, C4-5, and C5-6 is again noted. There is lucency surrounding disc spacer at C6-7 without evidence of bridging bone. There is bridging bone across the C7-T1 level. The plate is separated from the T1 vertebral body anteriorly without significant interval change.  The soft tissues of the neck are otherwise  unremarkable.  C2-3: A leftward disc osteophyte complex and asymmetric facet spurring results in mild left central and moderate left foraminal stenosis.  C3-4: Left-sided uncovertebral and facet disease results in moderate low foraminal narrowing.  C4-5: Solid anterior fusion is present. No residual recurrent stenosis is evident.  C5-6: Solid anterior fusion is present. Mild facet hypertrophy is noted. There is no residual recurrent stenosis.  C6-7: There is no bridging bone. Lucency remains across the disc spacer. Facet hypertrophy and  uncovertebral spurring contribute to moderate residual foraminal stenosis left greater than right.  C7-T1: There is bridging bone across the disc space. Facet hypertrophy contributes to mild foraminal narrowing bilaterally, left greater than right.  The lung apices are clear.  CT LUMBAR MYELOGRAM FINDINGS:  The lumbar spine is imaged from the midbody of T12 through S3-4. Slight anterolisthesis at L4-5 is reduced in the supine position. AP alignment is otherwise anatomic. Vertebral body heights are normal. Mild rightward curvature is centered at L1. There is leftward curvature at L5-S1.  Atherosclerotic calcifications are present within the aorta without aneurysm. No other focal lesions are present. There is no significant adenopathy.  L1-2:  Negative.  L2-3:  Negative.  L3-4: A broad-based disc protrusion is present. Moderate facet hypertrophy and ligamentum flavum thickening is present this results in mild subarticular narrowing bilaterally. The foramina are patent.  L4-5: A broad-based disc protrusion is present. Moderate facet hypertrophy and ligamentum flavum thickening is noted. Moderate subarticular narrowing is present bilaterally. Mild foraminal narrowing is present bilaterally.  L5-S1: There is chronic loss of disc height. A vacuum disc is present. A broad-based disc protrusion is present. Facet spurring is worse on the right. Moderate left and mild right subarticular narrowing is present. Moderate foraminal stenosis is present bilaterally.  IMPRESSION: 1. Moderate left and mild right subarticular narrowing at L5-S1 with moderate foraminal stenosis bilaterally. 2. Moderate subarticular and mild foraminal stenosis bilaterally at L4-5. This is more evident on CT than myelogram. Anterolisthesis is present on the myelogram but reduced in the prone position on CT. 3. Broad-based disc protrusion with mild subarticular narrowing bilaterally at L3-4. 4. Solid fusion  of the cervical spine at C3-4, C4-5, and C5-6. 5. Residual broad-based disc protrusion and facet hypertrophy with moderate foraminal narrowing bilaterally, left greater than right at C6-7. 6. Solid fusion at C7-T1 with mild residual foraminal stenosis bilaterally due to facet spurring. 7. Leftward disc osteophyte complex and facet spurring at C2-3 with mild left subarticular and moderate left foraminal stenosis.   Electronically Signed   By: San Morelle M.D.   On: 10/22/2016 15:36      Complexity Note: Imaging results reviewed. Results shared with Ms. Roda Shutters, using Layman's terms.                         ROS  Cardiovascular History: Needs antibiotics prior to dental procedures Pulmonary or Respiratory History: Shortness of breath and Smoking Neurological History: Abnormal skin sensations (Peripheral Neuropathy) Review of Past Neurological Studies: No results found for this or any previous visit. Psychological-Psychiatric History: Anxiousness, Depressed and Difficulty sleeping and or falling asleep Gastrointestinal History: Reflux or heatburn Genitourinary History: No reported renal or genitourinary signs or symptoms such as difficulty voiding or producing urine, peeing blood, non-functioning kidney, kidney stones, difficulty emptying the bladder, difficulty controlling the flow of urine, or chronic kidney disease Hematological History: Weakness due to low blood hemoglobin or red blood cell count (Anemia) Endocrine History: No reported endocrine signs or  symptoms such as high or low blood sugar, rapid heart rate due to high thyroid levels, obesity or weight gain due to slow thyroid or thyroid disease Rheumatologic History: Joint aches and or swelling due to excess weight (Osteoarthritis) and Rheumatoid arthritis Musculoskeletal History: Negative for myasthenia gravis, muscular dystrophy, multiple sclerosis or malignant hyperthermia Work History: Out of work due to  pain  Allergies  Ms. Solano is allergic to plaquenil [hydroxychloroquine].  Laboratory Chemistry  Inflammation Markers (CRP: Acute Phase) (ESR: Chronic Phase) No results found for: CRP, ESRSEDRATE, LATICACIDVEN               Rheumatology Markers No results found for: RF, ANA, Therisa Doy[place], [facility]              Renal Function Markers Lab Results  Component Value Date   BUN 10 04/16/2017   CREATININE 0.73 04/16/2017   GFRAA 104 04/16/2017   GFRNONAA 90 04/16/2017                 Hepatic Function Markers Lab Results  Component Value Date   AST 18 04/16/2017   ALT 12 04/16/2017   ALBUMIN 4.3 04/16/2017   ALKPHOS 99 04/16/2017                 Electrolytes Lab Results  Component Value Date   NA 144 04/16/2017   K 3.8 04/16/2017   CL 110 (H) 04/16/2017   CALCIUM 9.3 04/16/2017                 Neuropathy Markers Lab Results  Component Value Date   HGBA1C 5.2 04/16/2017                 Bone Pathology Markers No results found for: [place], [id number], [id number], [id number], 25OHVITD1, 25OHVITD2, 25OHVITD3, TESTOFREE, TESTOSTERONE               Coagulation Parameters Lab Results  Component Value Date   PLT 297 04/16/2017                 Cardiovascular Markers Lab Results  Component Value Date   HGB 12.0 04/16/2017   HCT 35.1 04/16/2017                 CA Markers No results found for: CEA, CA125, LABCA2               Note: Lab results reviewed.  [place]  Drug: Ms. Checo  reports that she does not use drugs. Alcohol:  reports that she does not drink alcohol. Tobacco:  reports that she has been smoking.  She has a 20.00 pack-year smoking history. she has never used smokeless tobacco. Medical:  has a past medical history of Anemia, Cervicalgia, Depression, GERD (gastroesophageal reflux disease), Heart murmur, Other and unspecified hyperlipidemia, and Tachycardia. Family: family history includes Alcohol abuse in her father and mother;  Bipolar disorder in her mother; Pancreatic cancer in her father; Suicidality in her mother.  Past Surgical History:  Procedure Laterality Date  . BREAST ENHANCEMENT SURGERY  1981  . CARPAL TUNNEL RELEASE  11/11/2011   Procedure: CARPAL TUNNEL RELEASE;  Surgeon: Floyce Stakes, MD;  Location: [facility];  Service: Neurosurgery;  Laterality: Right;  Right Median Nerve Decompression  . CARPAL TUNNEL RELEASE  2013  . ESOPHAGOGASTRODUODENOSCOPY    . neck disc surgery     plates and screws in neck x 2  . ROTATOR CUFF REPAIR  06/16/2016   right shoulder   .  ROTATOR CUFF REPAIR    . TONSILLECTOMY AND ADENOIDECTOMY  1973    Active Ambulatory Problems    Diagnosis Date Noted  . Hyperlipidemia 09/22/2010  . SMOKER 09/22/2010  . Paroxysmal supraventricular tachycardia ([place]) 09/22/2010  . CHEST PAIN UNSPECIFIED 09/22/2010  . ELECTROCARDIOGRAM, ABNORMAL 09/22/2010  . Shortness of breath 12/22/2013  . Closed fracture of distal phalanx of thumb 08/15/2014  . Acid reflux 10/11/2014  . Arthritis, degenerative 01/30/2014  . Arthritis or polyarthritis, rheumatoid ([place]) 01/30/2014  . B-complex deficiency 09/09/2007  . CN (constipation) 06/26/2007  . Clinical depression 06/26/2007  . Family history of malignant neoplasm of pancreas 04/03/2015  . Cold sore 06/26/2007  . H/O alcohol abuse 06/26/2007  . Hypercholesteremia 04/03/2015  . Disorder of iron metabolism 04/03/2015  . Cannot sleep 06/26/2007  . Localized osteoarthrosis, hand 06/26/2007  . Menopausal symptom 06/26/2007  . Weight loss 04/03/2015  . Delayed onset of urination 04/03/2015  . COPD (chronic obstructive pulmonary disease) ([place]) 04/10/2015  . Closed compression fracture of L5 lumbar vertebra ([place]) 07/07/2017  . Sacral insufficiency fracture with routine healing 07/07/2017  . Essential hypertension 07/28/2017  . Cervical radiculopathy 08/02/2017  . Neuropathy 08/02/2017   Resolved Ambulatory Problems    Diagnosis Date Noted   . Premature menopause 04/03/2015  . Fast heart beat 06/26/2007   Past Medical History:  Diagnosis Date  . Anemia   . Cervicalgia   . Depression   . GERD (gastroesophageal reflux disease)   . Heart murmur   . Other and unspecified hyperlipidemia   . Tachycardia    Constitutional Exam  General appearance: Well nourished, well developed, and well hydrated. In no apparent acute distress Vitals:   09/07/17 1341  BP: (!) 149/85  Pulse: 95  Resp: 16  Temp: 97.8 F (36.6 C)  TempSrc: Oral  SpO2: 98%  Weight: 147 lb (66.7 kg)  Height: '5\' 9"'$  (1.753 m)   BMI Assessment: Estimated body mass index is 21.71 kg/m as calculated from the following:   Height as of this encounter: '5\' 9"'$  (1.753 m).   Weight as of this encounter: 147 lb (66.7 kg).  BMI interpretation table: BMI level Category Range association with higher incidence of chronic pain  <18 kg/m2 Underweight   18.5-24.9 kg/m2 Ideal body weight   25-29.9 kg/m2 Overweight Increased incidence by 20%  30-34.9 kg/m2 Obese (Class I) Increased incidence by 68%  35-39.9 kg/m2 Severe obesity (Class II) Increased incidence by 136%  >40 kg/m2 Extreme obesity (Class III) Increased incidence by 254%   BMI Readings from Last 4 Encounters:  09/07/17 21.71 kg/m  08/02/17 23.65 kg/m  07/28/17 23.34 kg/m  07/07/17 22.12 kg/m   Wt Readings from Last 4 Encounters:  09/07/17 147 lb (66.7 kg)  08/02/17 151 lb (68.5 kg)  07/28/17 149 lb (67.6 kg)  07/07/17 149 lb 12.8 oz (67.9 kg)  Psych/Mental status: Alert, oriented x 3 (person, place, & time)       Eyes: PERLA Respiratory: No evidence of acute respiratory distress  Cervical Spine Area Exam  Skin & Axial Inspection: Well healed scar from previous spine surgery detected Alignment: Symmetrical Functional ROM: Decreased ROM, bilaterally Stability: No instability detected Muscle Tone/Strength: Functionally intact. No obvious neuro-muscular anomalies detected. Sensory (Neurological):  Dermatomal pain pattern Palpation: Complains of area being tender to palpation              Upper Extremity (UE) Exam    Side: Right upper extremity  Side: Left upper extremity  Skin & Extremity  Inspection: Skin color, temperature, and hair growth are WNL. No peripheral edema or cyanosis. No masses, redness, swelling, asymmetry, or associated skin lesions. No contractures.  Skin & Extremity Inspection: Skin color, temperature, and hair growth are WNL. No peripheral edema or cyanosis. No masses, redness, swelling, asymmetry, or associated skin lesions. No contractures.  Functional ROM: Unrestricted ROM          Functional ROM: Unrestricted ROM          Muscle Tone/Strength: Functionally intact. No obvious neuro-muscular anomalies detected.  Muscle Tone/Strength: Functionally intact. No obvious neuro-muscular anomalies detected.  Sensory (Neurological): Paresthesia (Tingling sensation)          Sensory (Neurological): Paresthesia (Tingling sensation)          Palpation: No palpable anomalies              Palpation: No palpable anomalies              Specialized Test(s): Deferred         Specialized Test(s): Deferred          Thoracic Spine Area Exam  Skin & Axial Inspection: No masses, redness, or swelling Alignment: Symmetrical Functional ROM: Unrestricted ROM Stability: No instability detected Muscle Tone/Strength: Functionally intact. No obvious neuro-muscular anomalies detected. Sensory (Neurological): Unimpaired Muscle strength & Tone: No palpable anomalies  Lumbar Spine Area Exam  Skin & Axial Inspection: No masses, redness, or swelling Alignment: Symmetrical Functional ROM: Unrestricted ROM      Stability: No instability detected Muscle Tone/Strength: Functionally intact. No obvious neuro-muscular anomalies detected. Sensory (Neurological): Unimpaired Palpation: No palpable anomalies       Provocative Tests: Lumbar Hyperextension and rotation test: evaluation deferred today        Lumbar Lateral bending test: evaluation deferred today       Patrick's Maneuver: evaluation deferred today                    Gait & Posture Assessment  Ambulation: Unassisted Gait: Relatively normal for age and body habitus Posture: WNL   Lower Extremity Exam    Side: Right lower extremity  Side: Left lower extremity  Skin & Extremity Inspection: Skin color, temperature, and hair growth are WNL. No peripheral edema or cyanosis. No masses, redness, swelling, asymmetry, or associated skin lesions. No contractures.  Skin & Extremity Inspection: Skin color, temperature, and hair growth are WNL. No peripheral edema or cyanosis. No masses, redness, swelling, asymmetry, or associated skin lesions. No contractures.  Functional ROM: Unrestricted ROM          Functional ROM: Unrestricted ROM          Muscle Tone/Strength: Functionally intact. No obvious neuro-muscular anomalies detected.  Muscle Tone/Strength: Functionally intact. No obvious neuro-muscular anomalies detected.  Sensory (Neurological): Unimpaired  Sensory (Neurological): Unimpaired  Palpation: No palpable anomalies  Palpation: No palpable anomalies   Assessment  Primary Diagnosis & Pertinent Problem List: The primary encounter diagnosis was Cervical fusion syndrome. Diagnoses of Cervical radiculopathy, Cervicalgia, Hx of repair of right rotator cuff, History of carpal tunnel surgery, and Chronic pain syndrome were also pertinent to this visit.  Visit Diagnosis (New problems to examiner): 1. Cervical fusion syndrome   2. Cervical radiculopathy   3. Cervicalgia   4. Hx of repair of right rotator cuff   5. History of carpal tunnel surgery   6. Chronic pain syndrome   General Recommendations: The pain condition that the patient suffers from is best treated with a multidisciplinary approach that  involves an increase in physical activity to prevent de-conditioning and worsening of the pain cycle, as well as psychological counseling  (formal and/or informal) to address the co-morbid psychological affects of pain. Treatment will often involve judicious use of pain medications and interventional procedures to decrease the pain, allowing the patient to participate in the physical activity that will ultimately produce long-lasting pain reductions. The goal of the multidisciplinary approach is to return the patient to a higher level of overall function and to restore their ability to perform activities of daily living.   62 year old female with a primary complaint of neck pain that radiates down into bilateral hands.  Patient's past medical history significant for rheumatoid arthritis, cervical radiculopathy and fusion with extensive cervical spine surgery hx as follow: 2012 C3-4 fusion, 2014 C4-6 fusion and 2017 C6-T1 fusion.  Patient recently went to the [facility] in [facility] for a comprehensive cervical spine evaluation.  There she met with neurosurgery, physical medicine, pain medicine to help with her symptoms.  At that time she received a left C7-T1 ESI at the [facility] in [facility] which was effective for her upper extremity radicular symptoms.  In regards to medication management, patient takes oxycodone 5-10 mg up to 4 times a day as needed but states that she generally takes it twice a day 1-2 tablets as needed.  Patient is also had an EMG completed which shows length dependent peripheral neuropathy.  Patient also has carpal tunnel syndrome status post carpal tunnel surgery 2015.  Patient has previously tried Flexeril, ibuprofen, meloxicam which are only somewhat helpful.  She takes Zoloft 100 mg daily.  Patient also takes gabapentin 1200 mg nightly.  This was a recent increase by her PCP.  Patient is motivated to decrease her opioid intake.  She states that she is still getting benefit from her cervical epidural steroid injection but is interested in repeating them in the future when her pain returns.  Since  the patient just had a medication refill August 31, 2016, there is no need for an upcoming medication refill visit.  Today I will obtain a urine drug screen and have the patient follow-up in approximately 5 weeks at which point I will take over her opioid medications pending UDS screen.  We will hold off on performing another cervical epidural steroid injection since the patient is still getting benefit from her previous cervical ESI performed December 2018 at the [facility] in [place].  She also met with a neurosurgeon at that time and surgery was not recommended given that it would be a very long and complex surgery from a posterior approach with a long recovery process.  Plan: -UDS today -Continue oxycodone as currently prescribed.  Have discussed with patient that if I take over her opioid medications, we will plan on weaning her dose.  She is in agreement with this plan. -Continue gabapentin at increased dose of 1200 mill grams nightly. -Continue venlafaxine 225 mg daily.  Ordered Lab-work, Procedure(s), Referral(s), & Consult(s): Orders Placed This Encounter  Procedures  . Compliance Drug Analysis, Ur   Pharmacotherapy (current): Medications ordered:  No orders of the defined types were placed in this encounter.  Medications administered during this visit: Bitha Z. Rother had no medications administered during this visit.   Pharmacological management options:  Opioid Analgesics: The patient was informed that there is no guarantee that she would be a candidate for opioid analgesics. The decision will be made following CDC guidelines. This decision will be based  on the results of diagnostic studies, as well as Ms. Longie's risk profile.   Membrane stabilizer: To be determined at a later time  Muscle relaxant: To be determined at a later time  NSAID: To be determined at a later time  Other analgesic(s): To be determined at a later time   Interventional management  options: Ms. Stalzer was informed that there is no guarantee that she would be a candidate for interventional therapies. The decision will be based on the results of diagnostic studies, as well as Ms. Romig's risk profile.  Procedure(s) under consideration:  Cervical ESI Cervical TPI   Provider-requested follow-up: Return in about 6 weeks (around 10/19/2017) for Medication Management.  Future Appointments  Date Time Provider [place]  10/19/2017 11:30 AM Gabrielle Santa, MD [facility] None    Primary Care Physician: Birdie Sons, MD Location: [facility] Note by: Gabrielle Pineda, M.D, Date: 09/07/2017; Time: 3:39 PM  There are no Patient Instructions on file for this visit.

## 2017-09-12 ENCOUNTER — Encounter: Payer: Self-pay | Admitting: Physician Assistant

## 2017-09-12 DIAGNOSIS — M533 Sacrococcygeal disorders, not elsewhere classified: Secondary | ICD-10-CM

## 2017-09-12 DIAGNOSIS — M5136 Other intervertebral disc degeneration, lumbar region: Secondary | ICD-10-CM

## 2017-09-14 LAB — COMPLIANCE DRUG ANALYSIS, UR

## 2017-09-22 ENCOUNTER — Encounter: Payer: Self-pay | Admitting: Physician Assistant

## 2017-09-22 DIAGNOSIS — F419 Anxiety disorder, unspecified: Secondary | ICD-10-CM

## 2017-09-22 MED ORDER — BUSPIRONE HCL 10 MG PO TABS: 10.0000 mg | ORAL_TABLET | Freq: Two times a day (BID) | ORAL | 0 refills | Status: DC

## 2017-10-08 ENCOUNTER — Ambulatory Visit (INDEPENDENT_AMBULATORY_CARE_PROVIDER_SITE_OTHER): Payer: BLUE CROSS/BLUE SHIELD | Admitting: Family Medicine

## 2017-10-08 ENCOUNTER — Encounter: Payer: Self-pay | Admitting: Family Medicine

## 2017-10-08 DIAGNOSIS — J0140 Acute pansinusitis, unspecified: Secondary | ICD-10-CM

## 2017-10-08 MED ORDER — AMOXICILLIN-POT CLAVULANATE 875-125 MG PO TABS: 1.0000 | ORAL_TABLET | Freq: Two times a day (BID) | ORAL | 0 refills | Status: AC

## 2017-10-08 NOTE — Progress Notes (Signed)
Patient: Gabrielle Pineda Female    DOB: 10-03-1955   62 y.o.   MRN: [id number] Visit Date: 10/08/2017  Today's Provider: Lelon Huh, MD   Chief Complaint  Patient presents with  . Cough    x 1 week   Subjective:    Cough  This is a new problem. Episode onset: 1 week ago. The problem has been gradually worsening. The cough is productive of sputum (green/ brown colored sputum). Associated symptoms include chest pain (when climbing stairs), ear congestion, headaches, nasal congestion, postnasal drip, rhinorrhea, shortness of breath and wheezing. Pertinent negatives include no chills, ear pain, fever, hemoptysis, myalgias, sore throat or sweats. Treatments tried: Prescription Inhalers, Mucinex, Tylenol Severe Sinus, Robitussin, and other cough syrups. The treatment provided no relief.       Allergies  Allergen Reactions  . Plaquenil [Hydroxychloroquine] Rash     Current Outpatient Medications:  .  albuterol (PROVENTIL HFA;VENTOLIN HFA) 108 (90 Base) MCG/ACT inhaler, Inhale 2 puffs into the lungs every 6 (six) hours as needed for wheezing or shortness of breath., Disp: 1 Inhaler, Rfl: 6 .  atorvastatin (LIPITOR) 40 MG tablet, TAKE ONE TABLET BY MOUTH ONCE DAILY, Disp: 90 tablet, Rfl: 0 .  b complex vitamins tablet, Take 1 tablet by mouth daily. B12, Disp: , Rfl:  .  busPIRone (BUSPAR) 10 MG tablet, Take 1 tablet (10 mg total) by mouth 2 (two) times daily., Disp: 60 tablet, Rfl: 0 .  FOLIC ACID PO, Take 10 mg tablet twice a day., Disp: , Rfl:  .  gabapentin (NEURONTIN) 600 MG tablet, TAKE 2 TABLETS BY MOUTH AT BEDTIME, Disp: 60 tablet, Rfl: 5 .  guaiFENesin (MUCINEX) 600 MG 12 hr tablet, Take 1,200 mg by mouth 2 (two) times daily., Disp: , Rfl:  .  ibuprofen (ADVIL,MOTRIN) 200 MG tablet, Take 200 mg by mouth every 6 (six) hours as needed. For pain, Disp: , Rfl:  .  meloxicam (MOBIC) 7.5 MG tablet, Take 7.5 mg by mouth daily., Disp: , Rfl:  .  Methotrexate, PF, 25 MG/0.5ML  SOAJ, 0.4mL once weekly, Disp: 0.5 mL, Rfl: 11 .  Multiple Vitamin (MULTIVITAMIN) tablet, Take 1 tablet by mouth daily.  , Disp: , Rfl:  .  Nutritional Supplements (ESTROVEN ENERGY PO), Take by mouth daily., Disp: , Rfl:  .  omeprazole (PRILOSEC) 20 MG capsule, Take 20 mg by mouth daily., Disp: , Rfl:  .  sertraline (ZOLOFT) 100 MG tablet, TAKE 1 TABLET BY MOUTH ONCE DAILY, Disp: 90 tablet, Rfl: 1 .  traZODone (DESYREL) 50 MG tablet, TAKE 1 TO 2 TABLETS BY MOUTH AT BEDTIME, Disp: 180 tablet, Rfl: 1 .  valACYclovir (VALTREX) 1000 MG tablet, TAKE TWO TABLETS BY MOUTH TWICE DAILY FOR 5 DAYS AS NEEDED, Disp: 40 tablet, Rfl: 3 .  Venlafaxine HCl 75 MG TB24, TAKE 1 TABLET BY MOUTH THREE TIMES DAILY (Patient taking differently: TAKE 3 TABLET BY MOUTH DAILY), Disp: 270 tablet, Rfl: 3 .  amoxicillin-clavulanate (AUGMENTIN) 875-125 MG tablet, Take 1 tablet by mouth 2 (two) times daily. (Patient not taking: Reported on 09/07/2017), Disp: 20 tablet, Rfl: 0 .  cyclobenzaprine (FLEXERIL) 10 MG tablet, Take 1 tablet (10 mg total) by mouth 3 (three) times daily as needed for muscle spasms. (Patient not taking: Reported on 09/07/2017), Disp: 90 tablet, Rfl: 0 .  oxyCODONE (OXY IR/ROXICODONE) 5 MG immediate release tablet, 1-2 tabs every 6 hours as needed (Patient not taking: Reported on 10/08/2017), Disp: 240 tablet, Rfl: 0 .  varenicline (CHANTIX CONTINUING MONTH PAK) 1 MG tablet, Take 1 tablet (1 mg total) by mouth 2 (two) times daily. (Patient not taking: Reported on 08/02/2017), Disp: 60 tablet, Rfl: 4  Current Facility-Administered Medications:  .  ipratropium-albuterol (DUONEB) 0.5-2.5 (3) MG/3ML nebulizer solution 3 mL, 3 mL, Nebulization, Once, Pollak, Adriana M, PA-C  Review of Systems  Constitutional: Positive for fatigue. Negative for appetite change, chills and fever.  HENT: Positive for congestion (chest and sinus congestion), nosebleeds, postnasal drip, rhinorrhea, sinus pressure, sinus pain and  sneezing. Negative for ear pain, sore throat and voice change.   Respiratory: Positive for cough, shortness of breath and wheezing. Negative for hemoptysis and chest tightness.   Cardiovascular: Positive for chest pain (when climbing stairs). Negative for palpitations.  Gastrointestinal: Negative for abdominal pain, nausea and vomiting.  Musculoskeletal: Negative for myalgias.  Neurological: Positive for headaches. Negative for dizziness and weakness.    Social History   Tobacco Use  . Smoking status: Current Every Day Smoker    Packs/day: 0.50    Years: 40.00    Pack years: 20.00  . Smokeless tobacco: Never Used  Substance Use Topics  . Alcohol use: No    Comment: recovering alcoholic Since [id number]    Objective:   BP (!) 140/92 (BP Location: Left Arm, Patient Position: Sitting, Cuff Size: Normal)   Pulse 85   Temp 97.9 F (36.6 C) (Oral)   Resp 20   Wt 149 lb (67.6 kg)   SpO2 98% Comment: room air  BMI 22.00 kg/m  There were no vitals filed for this visit.   Physical Exam  General Appearance:    Alert, cooperative, no distress  HENT:   bilateral TM normal without fluid or infection, frontal and maxillary sinus tender and nasal mucosa congested.   Eyes:    PERRL, conjunctiva/corneas clear, EOM's intact       Lungs:     Clear to auscultation bilaterally, respirations unlabored  Heart:    Regular rate and rhythm  Neurologic:   Awake, alert, oriented x 3. No apparent focal neurological           defect.           Assessment & Plan:     1. Acute pansinusitis, recurrence not specified  - amoxicillin-clavulanate (AUGMENTIN) 875-125 MG tablet; Take 1 tablet by mouth 2 (two) times daily for 10 days.  Dispense: 20 tablet; Refill: 0  Call if symptoms change or if not rapidly improving.          Lelon Huh, MD  [facility]

## 2017-10-12 ENCOUNTER — Other Ambulatory Visit: Payer: Self-pay | Admitting: Cardiovascular Disease

## 2017-10-19 ENCOUNTER — Other Ambulatory Visit: Payer: Self-pay

## 2017-10-19 ENCOUNTER — Ambulatory Visit
Payer: BLUE CROSS/BLUE SHIELD | Attending: Student in an Organized Health Care Education/Training Program | Admitting: Student in an Organized Health Care Education/Training Program

## 2017-10-19 ENCOUNTER — Encounter: Payer: Self-pay | Admitting: Student in an Organized Health Care Education/Training Program

## 2017-10-19 VITALS — BP 141/66 | HR 67 | Temp 98.3°F | Resp 16 | Ht 69.0 in | Wt 145.0 lb

## 2017-10-19 DIAGNOSIS — M4802 Spinal stenosis, cervical region: Secondary | ICD-10-CM | POA: Insufficient documentation

## 2017-10-19 DIAGNOSIS — S3210XD Unspecified fracture of sacrum, subsequent encounter for fracture with routine healing: Secondary | ICD-10-CM | POA: Diagnosis not present

## 2017-10-19 DIAGNOSIS — E539 Vitamin B deficiency, unspecified: Secondary | ICD-10-CM | POA: Insufficient documentation

## 2017-10-19 DIAGNOSIS — G894 Chronic pain syndrome: Secondary | ICD-10-CM | POA: Insufficient documentation

## 2017-10-19 DIAGNOSIS — E785 Hyperlipidemia, unspecified: Secondary | ICD-10-CM | POA: Diagnosis not present

## 2017-10-19 DIAGNOSIS — J449 Chronic obstructive pulmonary disease, unspecified: Secondary | ICD-10-CM | POA: Insufficient documentation

## 2017-10-19 DIAGNOSIS — I471 Supraventricular tachycardia: Secondary | ICD-10-CM | POA: Insufficient documentation

## 2017-10-19 DIAGNOSIS — M542 Cervicalgia: Secondary | ICD-10-CM

## 2017-10-19 DIAGNOSIS — K219 Gastro-esophageal reflux disease without esophagitis: Secondary | ICD-10-CM | POA: Diagnosis not present

## 2017-10-19 DIAGNOSIS — X58XXXD Exposure to other specified factors, subsequent encounter: Secondary | ICD-10-CM | POA: Diagnosis not present

## 2017-10-19 DIAGNOSIS — M2578 Osteophyte, vertebrae: Secondary | ICD-10-CM | POA: Diagnosis not present

## 2017-10-19 DIAGNOSIS — Z981 Arthrodesis status: Secondary | ICD-10-CM | POA: Insufficient documentation

## 2017-10-19 DIAGNOSIS — I700 Atherosclerosis of aorta: Secondary | ICD-10-CM | POA: Insufficient documentation

## 2017-10-19 DIAGNOSIS — Q761 Klippel-Feil syndrome: Secondary | ICD-10-CM | POA: Insufficient documentation

## 2017-10-19 DIAGNOSIS — M5412 Radiculopathy, cervical region: Secondary | ICD-10-CM | POA: Insufficient documentation

## 2017-10-19 DIAGNOSIS — E7800 Pure hypercholesterolemia, unspecified: Secondary | ICD-10-CM | POA: Diagnosis not present

## 2017-10-19 DIAGNOSIS — X58XXXA Exposure to other specified factors, initial encounter: Secondary | ICD-10-CM | POA: Diagnosis not present

## 2017-10-19 DIAGNOSIS — M5127 Other intervertebral disc displacement, lumbosacral region: Secondary | ICD-10-CM | POA: Diagnosis not present

## 2017-10-19 DIAGNOSIS — M5126 Other intervertebral disc displacement, lumbar region: Secondary | ICD-10-CM | POA: Diagnosis not present

## 2017-10-19 DIAGNOSIS — F17200 Nicotine dependence, unspecified, uncomplicated: Secondary | ICD-10-CM | POA: Diagnosis not present

## 2017-10-19 DIAGNOSIS — Z79899 Other long term (current) drug therapy: Secondary | ICD-10-CM | POA: Diagnosis not present

## 2017-10-19 DIAGNOSIS — M069 Rheumatoid arthritis, unspecified: Secondary | ICD-10-CM | POA: Diagnosis not present

## 2017-10-19 DIAGNOSIS — M48061 Spinal stenosis, lumbar region without neurogenic claudication: Secondary | ICD-10-CM | POA: Diagnosis not present

## 2017-10-19 DIAGNOSIS — G8929 Other chronic pain: Secondary | ICD-10-CM | POA: Diagnosis present

## 2017-10-19 DIAGNOSIS — M4856XA Collapsed vertebra, not elsewhere classified, lumbar region, initial encounter for fracture: Secondary | ICD-10-CM | POA: Insufficient documentation

## 2017-10-19 DIAGNOSIS — M4722 Other spondylosis with radiculopathy, cervical region: Secondary | ICD-10-CM | POA: Insufficient documentation

## 2017-10-19 DIAGNOSIS — F329 Major depressive disorder, single episode, unspecified: Secondary | ICD-10-CM | POA: Insufficient documentation

## 2017-10-19 NOTE — Progress Notes (Signed)
Safety precautions to be maintained throughout the outpatient stay will include: orient to surroundings, keep bed in low position, maintain call bell within reach at all times, provide assistance with transfer out of bed and ambulation.  

## 2017-10-19 NOTE — Patient Instructions (Signed)
Epidural Steroid Injection Patient Information  Description: The epidural space surrounds the nerves as they exit the spinal cord.  In some patients, the nerves can be compressed and inflamed by a bulging disc or a tight spinal canal (spinal stenosis).  By injecting steroids into the epidural space, we can bring irritated nerves into direct contact with a potentially helpful medication.  These steroids act directly on the irritated nerves and can reduce swelling and inflammation which often leads to decreased pain.  Epidural steroids may be injected anywhere along the spine and from the neck to the low back depending upon the location of your pain.   After numbing the skin with local anesthetic (like Novocaine), a small needle is passed into the epidural space slowly.  You may experience a sensation of pressure while this is being done.  The entire block usually last less than 10 minutes.  Conditions which may be treated by epidural steroids:   Low back and leg pain  Neck and arm pain  Spinal stenosis  Post-laminectomy syndrome  Herpes zoster (shingles) pain  Pain from compression fractures  Preparation for the injection:  1. Do not eat any solid food or dairy products within 8 hours of your appointment.  2. You may drink clear liquids up to 3 hours before appointment.  Clear liquids include water, black coffee, juice or soda.  No milk or cream please. 3. You may take your regular medication, including pain medications, with a sip of water before your appointment  Diabetics should hold regular insulin (if taken separately) and take 1/2 normal NPH dos the morning of the procedure.  Carry some sugar containing items with you to your appointment. 4. A driver must accompany you and be prepared to drive you home after your procedure.  5. Bring all your current medications with your. 6. An IV may be inserted and sedation may be given at the discretion of the physician.   7. A blood pressure  cuff, EKG and other monitors will often be applied during the procedure.  Some patients may need to have extra oxygen administered for a short period. 8. You will be asked to provide medical information, including your allergies, prior to the procedure.  We must know immediately if you are taking blood thinners (like Coumadin/Warfarin)  Or if you are allergic to IV iodine contrast (dye). We must know if you could possible be pregnant.  Possible side-effects:  Bleeding from needle site  Infection (rare, may require surgery)  Nerve injury (rare)  Numbness & tingling (temporary)  Difficulty urinating (rare, temporary)  Spinal headache ( a headache worse with upright posture)  Light -headedness (temporary)  Pain at injection site (several days)  Decreased blood pressure (temporary)  Weakness in arm/leg (temporary)  Pressure sensation in back/neck (temporary)  Call if you experience:  Fever/chills associated with headache or increased back/neck pain.  Headache worsened by an upright position.  New onset weakness or numbness of an extremity below the injection site  Hives or difficulty breathing (go to the emergency room)  Inflammation or drainage at the infection site  Severe back/neck pain  Any new symptoms which are concerning to you  Please note:  Although the local anesthetic injected can often make your back or neck feel good for several hours after the injection, the pain will likely return.  It takes 3-7 days for steroids to work in the epidural space.  You may not notice any pain relief for at least that one week.    If effective, we will often do a series of three injections spaced 3-6 weeks apart to maximally decrease your pain.  After the initial series, we generally will wait several months before considering a repeat injection of the same type.  If you have any questions, please call [phone] [facility] 

## 2017-10-19 NOTE — Progress Notes (Signed)
Patient's Name: Gabrielle Pineda  MRN: [id number]  Referring Provider: Birdie Sons, MD  DOB: 06/17/1956  PCP: Birdie Sons, MD  DOS: 10/19/2017  Note by: [name], MD  Service setting: Ambulatory outpatient  Specialty: Interventional Pain Management  Location: [facility] (AMB) Pain Management Facility    Patient type: Established   Primary Reason(s) for Visit: Encounter for evaluation before starting new chronic pain management plan of care (Level of risk: moderate) CC: Neck Pain  HPI  Gabrielle Pineda is a 62 y.o. year old, female patient, who comes today for a follow-up evaluation to review the test results and decide on a treatment plan. She has Hyperlipidemia; SMOKER; Paroxysmal supraventricular tachycardia ([place]); CHEST PAIN UNSPECIFIED; ELECTROCARDIOGRAM, ABNORMAL; Shortness of breath; Closed fracture of distal phalanx of thumb; Acid reflux; Arthritis, degenerative; Arthritis or polyarthritis, rheumatoid ([place]); B-complex deficiency; CN (constipation); Clinical depression; Family history of malignant neoplasm of pancreas; Cold sore; H/O alcohol abuse; Hypercholesteremia; Disorder of iron metabolism; Cannot sleep; Localized osteoarthrosis, hand; Menopausal symptom; Weight loss; Delayed onset of urination; COPD (chronic obstructive pulmonary disease) ([place]); Closed compression fracture of L5 lumbar vertebra ([place]); Sacral insufficiency fracture with routine healing; Essential hypertension; Cervical radiculopathy; and Neuropathy on their problem list. Her primarily concern today is the Neck Pain  Pain Assessment: Location:   Neck Radiating: head and both shoulders Onset: More than a month ago Duration: Chronic pain Quality: Constant(lingering) Severity: 6 /10 (self-reported pain score)  Note: Reported level is inconsistent with clinical observations. Clinically the patient looks like a 2/10 A 2/10 is viewed as "Mild to Moderate" and described as noticeable and distracting. Impossible to hide  from other people. More frequent flare-ups. Still possible to adapt and function close to normal. It can be very annoying and may have occasional stronger flare-ups. With discipline, patients may get used to it and adapt.       When using our objective Pain Scale, levels between 6 and 10/10 are said to belong in an emergency room, as it progressively worsens from a 6/10, described as severely limiting, requiring emergency care not usually available at an outpatient pain management facility. At a 6/10 level, communication becomes difficult and requires great effort. Assistance to reach the emergency department may be required. Facial flushing and profuse sweating along with potentially dangerous increases in heart rate and blood pressure will be evident. Effect on ADL:   Timing: Constant Modifying factors: nothing  Gabrielle Pineda comes in today for a follow-up visit after her initial evaluation on 09/07/2017. Today we went over the results of her tests. These were explained in "Layman's terms". During today's appointment we went over my diagnostic impression, as well as the proposed treatment plan.  Patient follows up today continuing to endorse right neck pain that radiates down her right arm.  Patient is status post 3 cervical spine surgeries with the most recent one being a C5-T1 fusion.  She has had cervical epidural steroid injections in the past, most recent one was at [facility] in [place].  She is requesting a repeat of cervical epidural steroid injections.  Of note patient has weaned herself off of oxycodone over the last 3 weeks.  I commended her on this.  She states that her clarity and energy levels are improved although she does continue to have moderate pain that she is trying to cope with.  Patient's total opioid dose was approximately 60 MME's- Oxycodone 5 mg, 1-2 tablets up to 4 times daily as needed.,  Quantity 27-month  MME equals 60.  Last fill 08/31/2017, quantity 240  We  will focus on non-opioid management.  In considering the treatment plan options, Gabrielle Pineda was reminded that I no longer take patients for medication management only. I asked her to let me know if she had no intention of taking advantage of the interventional therapies, so that we could make arrangements to provide this space to someone interested. I also made it clear that undergoing interventional therapies for the purpose of getting pain medications is very inappropriate on the part of a patient, and it will not be tolerated in this practice. This type of behavior would suggest true addiction and therefore it requires referral to an addiction specialist.   Further details on both, my assessment(s), as well as the proposed treatment plan, please see below.  Controlled Substance Pharmacotherapy Assessment REMS (Risk Evaluation and Mitigation Strategy)    Monitoring: [place] PMP: Online review of the past 74-monthperiod previously conducted. Not applicable at this point since we have not taken over the patient's medication management yet. List of other Serum/Urine Drug Screening Test(s):  No results found for: AMPHSCRSER, BARBSCRSER, BENZOSCRSER, COCAINSCRSER, COCAINSCRNUR, [place] THCSCRSER, THCU, [place] PMarcus ESimpsonList of all UDS test(s) done:  Lab Results  Component Value Date   SUMMARY FINAL 09/07/2017   Last UDS on record: Summary  Date Value Ref Range Status  09/07/2017 FINAL  Final    Comment:    ==================================================================== TOXASSURE COMP DRUG ANALYSIS,UR ==================================================================== Specimen Alert Note:  Urinary creatinine is low; ability to detect some drugs may be compromised.  Interpret results with caution. ==================================================================== Test                             Result       Flag       Units Drug Present  and Declared for Prescription Verification   Oxycodone                      493          EXPECTED   ng/mg creat   Oxymorphone                    433          EXPECTED   ng/mg creat   Noroxycodone                   3433         EXPECTED   ng/mg creat    Sources of oxycodone include scheduled prescription medications.    Oxymorphone and noroxycodone are expected metabolites of    oxycodone. Oxymorphone is also available as a scheduled    prescription medication.   Gabapentin                     PRESENT      EXPECTED   Desmethylcyclobenzaprine       PRESENT      EXPECTED    Desmethylcyclobenzaprine is an expected metabolite of    cyclobenzaprine.   Trazodone                      PRESENT      EXPECTED   1,3 chlorophenyl piperazine    PRESENT      EXPECTED    1,3-chlorophenyl piperazine is an expected metabolite of    trazodone.   Venlafaxine  PRESENT      EXPECTED   Desmethylvenlafaxine           PRESENT      EXPECTED    Desmethylvenlafaxine is an expected metabolite of venlafaxine. Drug Present not Declared for Prescription Verification   Acetaminophen                  PRESENT      UNEXPECTED   Naproxen                       PRESENT      UNEXPECTED   Diphenhydramine                PRESENT      UNEXPECTED   Doxylamine                     PRESENT      UNEXPECTED   Dextromethorphan               PRESENT      UNEXPECTED   Dextrorphan/Levorphanol        PRESENT      UNEXPECTED    Dextrorphan is an expected metabolite of dextromethorphan, an    over-the-counter or prescription cough suppressant. Dextrorphan    cannot be distinguished from the scheduled prescription    medication levorphanol by the method used for analysis. Drug Absent but Declared for Prescription Verification   Sertraline                     Not Detected UNEXPECTED   Ibuprofen                      Not Detected UNEXPECTED    Ibuprofen, as indicated in the declared medication list, is not    always detected  even when used as directed.   Guaifenesin                    Not Detected UNEXPECTED ==================================================================== Test                      Result    Flag   Units      Ref Range   Creatinine              15        L      mg/dL      >=20 ==================================================================== Declared Medications:  The flagging and interpretation on this report are based on the  following declared medications.  Unexpected results may arise from  inaccuracies in the declared medications.  **Note: The testing scope of this panel includes these medications:  Cyclobenzaprine (Flexeril)  Gabapentin  Guaifenesin (Mucinex)  Oxycodone  Sertraline  Trazodone  Venlafaxine  **Note: The testing scope of this panel does not include small to  moderate amounts of these reported medications:  Ibuprofen  **Note: The testing scope of this panel does not include following  reported medications:  Albuterol (Proventil)  Amoxicillin (Augmentin)  Atorvastatin (Lipitor)  Clavulinate (Augmentin)  Folic acid  Meloxicam  Methotrexate  Multivitamin  Omeprazole (Prilosec)  Valacyclovir (Valtrex)  Varenicline (Chantix)  Vitamin B ==================================================================== For clinical consultation, please call [phone]. ====================================================================    UDS interpretation: No unexpected findings.          Medication Assessment Form: Not applicable. No opioids. Treatment compliance: Not applicable Risk Assessment Profile: Aberrant behavior: See initial evaluations. None observed or  detected today Comorbid factors increasing risk of overdose: See initial evaluation. No additional risks detected today Medical Psychology Evaluation: Please see scanned results in medical record. Opioid Risk Tool - 09/07/17 1350      Family History of Substance Abuse   Alcohol  Positive Female  mother and father both deceased   mother and father both deceased   Illegal Drugs  Positive Female oldest sister, deceased   oldest sister, deceased   Rx Drugs  Negative      Personal History of Substance Abuse   Alcohol  -- sober since 29   sober since 1994   Illegal Drugs  Negative    Rx Drugs  Negative      Psychological Disease   Psychological Disease  Positive    ADD  Negative    OCD  Negative    Bipolar  Negative    Schizophrenia  Negative    Depression  Positive takes zoloft and effexor and feels for the most part they are managing depression    takes zoloft and effexor and feels for the most part they are managing depression      Total Score   Opioid Risk Tool Scoring  6    Opioid Risk Interpretation  Moderate Risk      ORT Scoring interpretation table:  Score <3 = Low Risk for SUD  Score between 4-7 = Moderate Risk for SUD  Score >8 = High Risk for Opioid Abuse   Risk Mitigation Strategies:  Patient opioid safety counseling: Opioid therapy will not be included in the treatment plan. Patient-Prescriber Agreement (PPA): No agreement signed.  Controlled substance notification to other providers: None required. No opioid therapy.  Pharmacologic Plan: Gabrielle Pineda has voiced her wishes to stay away from opioid analgesics. Gabrielle Pineda states being against the use of "opioid analgesics", as part of her treatment plan.  Laboratory Chemistry  Inflammation Markers (CRP: Acute Phase) (ESR: Chronic Phase) No results found for: CRP, ESRSEDRATE, LATICACIDVEN                       Rheumatology Markers No results found for: RF, ANA, Therisa Doyne, [facility]              Renal Function Markers Lab Results  Component Value Date   BUN 10 04/16/2017   CREATININE 0.73 04/16/2017   GFRAA 104 04/16/2017   GFRNONAA 90 04/16/2017                 Hepatic Function Markers Lab Results  Component Value Date   AST 18 04/16/2017   ALT 12 04/16/2017    ALBUMIN 4.3 04/16/2017   ALKPHOS 99 04/16/2017                 Electrolytes Lab Results  Component Value Date   NA 144 04/16/2017   K 3.8 04/16/2017   CL 110 (H) 04/16/2017   CALCIUM 9.3 04/16/2017                        Neuropathy Markers Lab Results  Component Value Date   HGBA1C 5.2 04/16/2017                 Bone Pathology Markers No results found for: [place], [id number], TR3202BX4, DH6861UO3, 25OHVITD1, 25OHVITD2, 25OHVITD3, TESTOFREE, TESTOSTERONE                       Coagulation Parameters Lab Results  Component Value Date   PLT 297 04/16/2017                 Cardiovascular Markers Lab Results  Component Value Date   HGB 12.0 04/16/2017   HCT 35.1 04/16/2017                 CA Markers No results found for: CEA, CA125, LABCA2               Note: Lab results reviewed.  Recent Diagnostic Imaging Review  Cervical Imaging:  Cervical CT w contrast:  Results for orders placed during the hospital encounter of 10/22/16  CT CERVICAL SPINE W CONTRAST   Narrative CLINICAL DATA:  Central posterior neck pain extending to the right shoulder. Numbness in the left hand. Low back pain and numbness extending into the lower extremities bilaterally, right greater than left.  FLUOROSCOPY TIME:  Radiation Exposure Index (as provided by the fluoroscopic device): 515.01 uGy*m2  If the device does not provide the exposure index:Fluoroscopy Time: 1 minutes 29 seconds  Number of Acquired Images:  23  PROCEDURE: LUMBAR PUNCTURE FOR CERVICAL AND LUMBAR MYELOGRAM  CERVICAL AND LUMBAR MYELOGRAM  CT CERVICAL MYELOGRAM  CT LUMBAR MYELOGRAM  After thorough discussion of risks and benefits of the procedure including bleeding, infection, injury to nerves, blood vessels, adjacent structures as well as headache and CSF leak, written and oral informed consent was obtained. Consent was obtained by Dr. San Morelle.  Patient was positioned prone on the fluoroscopy  table. Local anesthesia was provided with 1% lidocaine without epinephrine after prepped and draped in the usual sterile fashion. Puncture was performed at L2-3 using a 3 1/2 inch 22-gauge spinal needle via left paramedian approach. Using a single pass through the dura, the needle was placed within the thecal sac, with return of clear CSF. 10 mL of Isovue-M 300 was injected into the thecal sac, with normal opacification of the nerve roots and cauda equina consistent with free flow within the subarachnoid space. The patient was then moved to the trendelenburg position and contrast flowed into the Cervical spine region.  I personally performed the lumbar puncture and administered the intrathecal contrast. I also personally supervised acquisition of the myelogram images.  TECHNIQUE: Contiguous axial images were obtained through the Cervical and Lumbar spine after the intrathecal infusion of infusion. Coronal and sagittal reconstructions were obtained of the axial image sets.  FINDINGS: CERVICAL AND LUMBAR MYELOGRAM FINDINGS:  A a broad-based disc protrusion is present at L5-S1 with subarticular narrowing bilaterally, left greater than right. There is early truncation of the L5 nerve roots, also worse on the left. The distal S1 nerve roots are normal. Grade 1 anterolisthesis is present at L4-5 with uncovering of a broad-based disc protrusion. Minimal subarticular narrowing is present. Mild disc bulging is present at L3-4.  There is no significant change in the anterolisthesis at L4-5 with standing, flexion, or extension. The disc protrusion at L3-4 is exaggerated with standing.  Contrast is poorly seen within the cervical spine. Fusion is solid. Cervical fusion is evident C3-4 through T1-2. AP alignment is present.  CT CERVICAL MYELOGRAM FINDINGS:  Cervical spine is imaged from the skullbase through T2-3.  Solid fusion at C3-4, C4-5, and C5-6 is again noted. There is lucency  surrounding disc spacer at C6-7 without evidence of bridging bone. There is bridging bone across the C7-T1 level. The plate is separated from the T1 vertebral body anteriorly without significant interval change.  The soft tissues of  the neck are otherwise unremarkable.  C2-3: A leftward disc osteophyte complex and asymmetric facet spurring results in mild left central and moderate left foraminal stenosis.  C3-4: Left-sided uncovertebral and facet disease results in moderate low foraminal narrowing.  C4-5: Solid anterior fusion is present. No residual recurrent stenosis is evident.  C5-6: Solid anterior fusion is present. Mild facet hypertrophy is noted. There is no residual recurrent stenosis.  C6-7: There is no bridging bone. Lucency remains across the disc spacer. Facet hypertrophy and uncovertebral spurring contribute to moderate residual foraminal stenosis left greater than right.  C7-T1: There is bridging bone across the disc space. Facet hypertrophy contributes to mild foraminal narrowing bilaterally, left greater than right.  The lung apices are clear.  CT LUMBAR MYELOGRAM FINDINGS:  The lumbar spine is imaged from the midbody of T12 through S3-4. Slight anterolisthesis at L4-5 is reduced in the supine position. AP alignment is otherwise anatomic. Vertebral body heights are normal. Mild rightward curvature is centered at L1. There is leftward curvature at L5-S1.  Atherosclerotic calcifications are present within the aorta without aneurysm. No other focal lesions are present. There is no significant adenopathy.  L1-2:  Negative.  L2-3:  Negative.  L3-4: A broad-based disc protrusion is present. Moderate facet hypertrophy and ligamentum flavum thickening is present this results in mild subarticular narrowing bilaterally. The foramina are patent.  L4-5: A broad-based disc protrusion is present. Moderate facet hypertrophy and ligamentum flavum thickening is  noted. Moderate subarticular narrowing is present bilaterally. Mild foraminal narrowing is present bilaterally.  L5-S1: There is chronic loss of disc height. A vacuum disc is present. A broad-based disc protrusion is present. Facet spurring is worse on the right. Moderate left and mild right subarticular narrowing is present. Moderate foraminal stenosis is present bilaterally.  IMPRESSION: 1. Moderate left and mild right subarticular narrowing at L5-S1 with moderate foraminal stenosis bilaterally. 2. Moderate subarticular and mild foraminal stenosis bilaterally at L4-5. This is more evident on CT than myelogram. Anterolisthesis is present on the myelogram but reduced in the prone position on CT. 3. Broad-based disc protrusion with mild subarticular narrowing bilaterally at L3-4. 4. Solid fusion of the cervical spine at C3-4, C4-5, and C5-6. 5. Residual broad-based disc protrusion and facet hypertrophy with moderate foraminal narrowing bilaterally, left greater than right at C6-7. 6. Solid fusion at C7-T1 with mild residual foraminal stenosis bilaterally due to facet spurring. 7. Leftward disc osteophyte complex and facet spurring at C2-3 with mild left subarticular and moderate left foraminal stenosis.   Electronically Signed   By: San Morelle M.D.   On: 10/22/2016 15:36    Cervical DG Myelogram views:  Results for orders placed during the hospital encounter of 12/20/12  DG Myelogram Cervical   Narrative *RADIOLOGY REPORT*  Clinical Data:  Recurrent neck pain.  Left upper extremity radiculitis.  The patient's symptoms initially resolved after spine surgery in 2011.  The current symptoms have occurred since.  MYELOGRAM INJECTION  Technique:  Informed consent was obtained from the patient prior to the procedure, including potential complications of headache, allergy, infection and pain.  A timeout procedure was performed. With the patient prone, the lower back  was prepped with Betadine. 1% Lidocaine was used for local anesthesia.  Lumbar puncture was performed at the right paramidline L1-2 level using a 22 gauge needle with return of clear CSF.  10 ml of Omnipaque 300was injected into the subarachnoid space .  IMPRESSION: Successful injection of  intrathecal contrast for myelography.  MYELOGRAM  CERVICAL  Technique: I personally performed the lumbar puncture and administered the intrathecal contrast. I also personally supervised acquisition of the myelogram images. Following injection of intrathecal Omnipaque contrast, spine imaging in multiple projections was performed using fluoroscopy.  Fluoroscopy Time: 1 minute 37 seconds  Comparison:  MRI of the cervical spine 01/14/2010  Findings: The patient is now status post ACDF at C4-5 and C5-6. The fusion appears solid at these levels.  Disc osteophyte complex is evident at C6-7 with some blunting of the nerve roots bilaterally.  There is also disc osteophyte complex at C3-4 with right greater than left uncovertebral disease.  Anterolisthesis is present at to C3-4 upon standing.  This is exaggerated with flexion and partially reduced in extension.  More mild dynamic anterolisthesis is present at C2-3.  IMPRESSION:  1.  Status post anterior fusion at C4-5 and C5-6 without abnormal motion. 2.  Disc osteophyte complex at C6-7 with bilateral nerve root impact. 3.  Dynamic anterolisthesis at C3-4 and to a lesser extent at C2-3.  *RADIOLOGY REPORT*  CT MYELOGRAPHY CERVICAL SPINE  Technique:  CT imaging of the cervical spine was performed after intrathecal contrast administration. Multiplanar CT image reconstructions were also generated.  Findings:  The patient is status post ACDF at C4-5 and C5-6. Fusion is solid both levels.  Slight anterolisthesis is evident at C3-4.  Alignment is otherwise anatomic.  The soft tissues of the neck are unremarkable.  The lung apices  are clear.  C2-3:  A broad-based disc osteophyte complex is asymmetric to the left.  Asymmetric left-sided uncovertebral spurring is noted as well.  Mild left foraminal narrowing is stable.  C3-4:  A broad-based disc osteophyte complex is present.  Bilateral facet hypertrophy has progressed, left greater than right. Uncovertebral spurring has worsened as well.  Mild to moderate central and moderate foraminal stenosis is present bilaterally.  C4-5:  The patient is fused anteriorly.  No residual disc herniation or stenosis is evident.  C5-6:  The patient is fused anteriorly.  There is no residual stenosis.  C6-7:  A broad-based disc osteophyte complex is present.  Mild to moderate left and mild right foraminal narrowing is evident.  There is partial effacement of the ventral CSF.  C7-T1:  End plate spurring is evident.  There is chronic loss of disc height.  No significant stenosis is present.  IMPRESSION:  1.  Status post ACDF at C4-5 and C5-6 without evidence for residual or recurrent stenosis. 2.  Mild to moderate left and mild right foraminal narrowing at C6- 7 secondary to a broad-based disc osteophyte complex and uncovertebral spurring. 3.  Progressive facet hypertrophy and uncovertebral disease at C3-4 with mild to moderate central and moderate foraminal stenosis bilaterally. 4.  Mild left foraminal narrowing at C2-3. 5.  Endplate spurring at Z3-G6 without significant stenosis.   Original Report Authenticated By: San Morelle, M.D.    Cervical DG Myelogram views:  Results for orders placed during the hospital encounter of 04/04/15  DG MYELOGRAPHY LUMBAR INJ CERVICAL   Narrative CLINICAL DATA:  Neck pain. Pain into the upper extremities. Tingling into the arms and bilateral hands, worse on the right. Symptoms were relieved by the most recent surgery, but have recurred.  FLUOROSCOPY TIME:  Fluoroscopy Time:  1 minutes 24 seconds  Number of Acquired Images:   12  PROCEDURE: LUMBAR PUNCTURE FOR CERVICAL MYELOGRAM  After thorough discussion of risks and benefits of the procedure including bleeding, infection, injury to nerves, blood vessels, adjacent structures as well  as headache and CSF leak, written and oral informed consent was obtained. Consent was obtained by Dr. San Morelle. We discussed the high likelihood of obtaining a diagnostic study.  Patient was positioned prone on the fluoroscopy table. Local anesthesia was provided with 1% lidocaine without epinephrine after prepped and draped in the usual sterile fashion. Puncture was performed at L3-4 using a 3 1/2 inch 22-gauge spinal needle via a left paramedian approach. Using a single pass through the dura, the needle was placed within the thecal sac, with return of clear CSF. 10 mL of Omnipaque-300 was injected into the thecal sac, with normal opacification of the nerve roots and cauda equina consistent with free flow within the subarachnoid space. The patient was then moved to the trendelenburg position and contrast flowed into the Cervical spine region.  I personally performed the lumbar puncture and administered the intrathecal contrast. I also personally supervised acquisition of the myelogram images.  TECHNIQUE: Contiguous axial images were obtained through the Cervical spine after the intrathecal infusion of infusion. Coronal and sagittal reconstructions were obtained of the axial image sets.  FINDINGS: CERVICAL MYELOGRAM FINDINGS:  The anterior fusion has been extended to include C3-4. No significant disc disease is evident at C2-3. A prominent disc osteophyte complex at C6-7 has progressed. There is no abnormal motion with flexion or extension.  CT CERVICAL MYELOGRAM FINDINGS:  The cervical spine is imaged from the skullbase through T3-4. Anterior fusion is solid at C3-4, C4-5, and C5-6. Vertebral body heights alignment are maintained.  C2-3: A mild  leftward disc osteophyte complex and left-sided facet hypertrophy is similar to the prior study. There is no significant stenosis.  C3-4: Anterior fusion is now present. The central canal is decompressed. Mild left foraminal narrowing is improved. There is some uncovertebral and facet disease. The posterior elements are now fused bilaterally.  C4-5: Anterior fusion is solid. No residual or recurrent stenosis is present.  C5-6: Anterior fusion is solid. No residual or recurrent stenosis is present.  C6-7: A broad-based disc osteophyte complex is present. There is partial effacement of ventral CSF. Moderate foraminal narrowing bilaterally demonstrate some progression, still worse left than right.  C7-T1: Central osteophytes are present without significant central canal stenosis. There is some uncovertebral disease without significant change or stenosis.  IMPRESSION: 1. Extension of anterior fusion to include C3-4. The central canal is decompressed. Mild left foraminal narrowing is improved. 2. Solid anterior fusion at C4-5 and C5-6 without residual or recurrent stenosis. 3. Slight progression of broad-based disc osteophyte complex with partial effacement of the ventral CSF and moderate foraminal narrowing, left greater than right. 4. Mild central disc osteophyte and bilateral uncovertebral spurring at C7-T1 without significant stenosis.   Electronically Signed   By: San Morelle M.D.   On: 04/04/2015 13:27     Thoracic CT w contrast:  Results for orders placed during the hospital encounter of 03/10/16  CT THORACIC SPINE W CONTRAST   Narrative CLINICAL DATA:  Cervical spondylosis without myelopathy. Neck pain, mid to upper back pain. Pain extends to both shoulders. FLUOROSCOPY TIME:  50 seconds corresponding to a Dose Area Product of 170.4 Gy*m2 PROCEDURE: LUMBAR PUNCTURE FOR CERVICAL AND THORACIC MYELOGRAM After thorough discussion of risks and benefits of  the procedure including bleeding, infection, injury to nerves, blood vessels, adjacent structures as well as headache and CSF leak, written and oral informed consent was obtained. Consent was obtained by Dr. Rolla Flatten. Patient was positioned prone on the fluoroscopy table. Local anesthesia was  provided with 1% lidocaine without epinephrine after prepped and draped in the usual sterile fashion. Puncture was performed at L3-L4 using a 3 1/2 inch 22-gauge spinal needle via midline approach. Using a single pass through the dura, the needle was placed within the thecal sac, with return of clear CSF. 10 mL of Isovue-M 300 was injected into the thecal sac, with normal opacification of the nerve roots and cauda equina consistent with free flow within the subarachnoid space. The patient was then moved to the trendelenburg position and contrast flowed into the Thoracic spine region. I personally performed the lumbar puncture and administered the intrathecal contrast. I also personally supervised acquisition of the myelogram images. TECHNIQUE: Contiguous axial images were obtained through the Cervical and Thoracic spine after the intrathecal infusion of infusion. Coronal and sagittal reconstructions were obtained of the axial image sets. FINDINGS: CERVICAL AND THORACIC MYELOGRAM FINDINGS: Cervical: There is poor opacification of the cervical subarachnoid space due to difficulty with transit of the bolus. There has been previous ACDF at C4-6 with subsequent C3-4 ACDF. All levels appears solid. No spinal stenosis or cord compression. Disc osteophyte complex at C7-T1. No abnormal motion with flexion extension. Thoracic: Ventral defects are seen at T5-6, T8-9, and T11-12. No thoracic compression fracture. No visible intradural mass lesion. Cord size normal throughout except for flattening at T8-9. CT CERVICAL MYELOGRAM FINDINGS: Alignment: Anatomic Vertebrae: Solid C3-C6 fusion. No malposition  of hardware or fracture. Cord: No cord compression. Posterior Fossa: No tonsillar herniation. Vertebral Arteries: Not assessed. Paraspinal tissues: Atherosclerosis. Disc levels: The individual disc spaces were examined as follows: C2-3: LEFT-sided uncinate spurring and facet arthropathy potentially affecting the LEFT C3 nerve. C3-4:  Solid fusion.  No impingement. C4-5:  Solid fusion.  No impingement. C5-6:  Solid fusion.  No impingement. C6-7: Central protrusion. BILATERAL foraminal narrowing due to uncinate spurring could affect either C7 nerve root, but worse on the LEFT. C7-T1: Disc osteophyte complex centrally. BILATERAL facet arthropathy. BILATERAL foraminal narrowing could affect either C8 nerve root. Compared to the prior myelogram in August 2016, similar appearance. CT THORACIC MYELOGRAM FINDINGS: Coarse trabecular markings suggesting osteopenia. No visible pulmonary mass or nodule. No mediastinal lesion. Platelike atelectasis LEFT base. No compression fracture. The individual disc spaces are examined as follows: T1-2:  Normal. T2-3:  Bulge.  No impingement. T3-4:  Bulge.  No impingement. T4-5:  Bulge.  No impingement. T5-6:  Leftward protrusion.  No definite impingement. T6-7:  Bulge.  No impingement. T7-8: Central protrusion. Slight effacement anterior subarachnoid space without significant stenosis or cord flattening. T8-9: Central extrusion. Moderate cord flattening. Correlate clinically for symptomatic myelopathy. T9-10:  Bulge.  No impingement. T10-11:  Unremarkable. T11-12:  Central and rightward protrusion.  No definite impingement. T12-L1: Bulge.  No impingement. IMPRESSION: Solid C3 through C6 ACDF. Moderate adjacent segment disease at C6-7 and C7-T1, but stable from August 2016. No dynamic instability. Multilevel thoracic degenerative disc disease, with the most significant abnormality at T8-9 where a central disc extrusion results in moderate cord  flattening. Electronically Signed   By: Staci Righter M.D.   On: 03/10/2016 14:44    Lumbar CT w contrast:  Results for orders placed during the hospital encounter of 10/22/16  CT LUMBAR SPINE W CONTRAST   Narrative CLINICAL DATA:  Central posterior neck pain extending to the right shoulder. Numbness in the left hand. Low back pain and numbness extending into the lower extremities bilaterally, right greater than left.  FLUOROSCOPY TIME:  Radiation Exposure Index (as provided by  the fluoroscopic device): 515.01 uGy*m2  If the device does not provide the exposure index:Fluoroscopy Time: 1 minutes 29 seconds  Number of Acquired Images:  23  PROCEDURE: LUMBAR PUNCTURE FOR CERVICAL AND LUMBAR MYELOGRAM  CERVICAL AND LUMBAR MYELOGRAM  CT CERVICAL MYELOGRAM  CT LUMBAR MYELOGRAM  After thorough discussion of risks and benefits of the procedure including bleeding, infection, injury to nerves, blood vessels, adjacent structures as well as headache and CSF leak, written and oral informed consent was obtained. Consent was obtained by Dr. San Morelle.  Patient was positioned prone on the fluoroscopy table. Local anesthesia was provided with 1% lidocaine without epinephrine after prepped and draped in the usual sterile fashion. Puncture was performed at L2-3 using a 3 1/2 inch 22-gauge spinal needle via left paramedian approach. Using a single pass through the dura, the needle was placed within the thecal sac, with return of clear CSF. 10 mL of Isovue-M 300 was injected into the thecal sac, with normal opacification of the nerve roots and cauda equina consistent with free flow within the subarachnoid space. The patient was then moved to the trendelenburg position and contrast flowed into the Cervical spine region.  I personally performed the lumbar puncture and administered the intrathecal contrast. I also personally supervised acquisition of the myelogram  images.  TECHNIQUE: Contiguous axial images were obtained through the Cervical and Lumbar spine after the intrathecal infusion of infusion. Coronal and sagittal reconstructions were obtained of the axial image sets.  FINDINGS: CERVICAL AND LUMBAR MYELOGRAM FINDINGS:  A a broad-based disc protrusion is present at L5-S1 with subarticular narrowing bilaterally, left greater than right. There is early truncation of the L5 nerve roots, also worse on the left. The distal S1 nerve roots are normal. Grade 1 anterolisthesis is present at L4-5 with uncovering of a broad-based disc protrusion. Minimal subarticular narrowing is present. Mild disc bulging is present at L3-4.  There is no significant change in the anterolisthesis at L4-5 with standing, flexion, or extension. The disc protrusion at L3-4 is exaggerated with standing.  Contrast is poorly seen within the cervical spine. Fusion is solid. Cervical fusion is evident C3-4 through T1-2. AP alignment is present.  CT CERVICAL MYELOGRAM FINDINGS:  Cervical spine is imaged from the skullbase through T2-3.  Solid fusion at C3-4, C4-5, and C5-6 is again noted. There is lucency surrounding disc spacer at C6-7 without evidence of bridging bone. There is bridging bone across the C7-T1 level. The plate is separated from the T1 vertebral body anteriorly without significant interval change.  The soft tissues of the neck are otherwise unremarkable.  C2-3: A leftward disc osteophyte complex and asymmetric facet spurring results in mild left central and moderate left foraminal stenosis.  C3-4: Left-sided uncovertebral and facet disease results in moderate low foraminal narrowing.  C4-5: Solid anterior fusion is present. No residual recurrent stenosis is evident.  C5-6: Solid anterior fusion is present. Mild facet hypertrophy is noted. There is no residual recurrent stenosis.  C6-7: There is no bridging bone. Lucency remains across the  disc spacer. Facet hypertrophy and uncovertebral spurring contribute to moderate residual foraminal stenosis left greater than right.  C7-T1: There is bridging bone across the disc space. Facet hypertrophy contributes to mild foraminal narrowing bilaterally, left greater than right.  The lung apices are clear.  CT LUMBAR MYELOGRAM FINDINGS:  The lumbar spine is imaged from the midbody of T12 through S3-4. Slight anterolisthesis at L4-5 is reduced in the supine position. AP alignment is otherwise anatomic. Vertebral  body heights are normal. Mild rightward curvature is centered at L1. There is leftward curvature at L5-S1.  Atherosclerotic calcifications are present within the aorta without aneurysm. No other focal lesions are present. There is no significant adenopathy.  L1-2:  Negative.  L2-3:  Negative.  L3-4: A broad-based disc protrusion is present. Moderate facet hypertrophy and ligamentum flavum thickening is present this results in mild subarticular narrowing bilaterally. The foramina are patent.  L4-5: A broad-based disc protrusion is present. Moderate facet hypertrophy and ligamentum flavum thickening is noted. Moderate subarticular narrowing is present bilaterally. Mild foraminal narrowing is present bilaterally.  L5-S1: There is chronic loss of disc height. A vacuum disc is present. A broad-based disc protrusion is present. Facet spurring is worse on the right. Moderate left and mild right subarticular narrowing is present. Moderate foraminal stenosis is present bilaterally.  IMPRESSION: 1. Moderate left and mild right subarticular narrowing at L5-S1 with moderate foraminal stenosis bilaterally. 2. Moderate subarticular and mild foraminal stenosis bilaterally at L4-5. This is more evident on CT than myelogram. Anterolisthesis is present on the myelogram but reduced in the prone position on CT. 3. Broad-based disc protrusion with mild subarticular  narrowing bilaterally at L3-4. 4. Solid fusion of the cervical spine at C3-4, C4-5, and C5-6. 5. Residual broad-based disc protrusion and facet hypertrophy with moderate foraminal narrowing bilaterally, left greater than right at C6-7. 6. Solid fusion at C7-T1 with mild residual foraminal stenosis bilaterally due to facet spurring. 7. Leftward disc osteophyte complex and facet spurring at C2-3 with mild left subarticular and moderate left foraminal stenosis.   Electronically Signed   By: San Morelle M.D.   On: 10/22/2016 15:36    Lumbar DG Myelogram:  Results for orders placed during the hospital encounter of 04/04/15  DG MYELOGRAPHY LUMBAR INJ CERVICAL   Narrative CLINICAL DATA:  Neck pain. Pain into the upper extremities. Tingling into the arms and bilateral hands, worse on the right. Symptoms were relieved by the most recent surgery, but have recurred.  FLUOROSCOPY TIME:  Fluoroscopy Time:  1 minutes 24 seconds  Number of Acquired Images:  12  PROCEDURE: LUMBAR PUNCTURE FOR CERVICAL MYELOGRAM  After thorough discussion of risks and benefits of the procedure including bleeding, infection, injury to nerves, blood vessels, adjacent structures as well as headache and CSF leak, written and oral informed consent was obtained. Consent was obtained by Dr. San Morelle. We discussed the high likelihood of obtaining a diagnostic study.  Patient was positioned prone on the fluoroscopy table. Local anesthesia was provided with 1% lidocaine without epinephrine after prepped and draped in the usual sterile fashion. Puncture was performed at L3-4 using a 3 1/2 inch 22-gauge spinal needle via a left paramedian approach. Using a single pass through the dura, the needle was placed within the thecal sac, with return of clear CSF. 10 mL of Omnipaque-300 was injected into the thecal sac, with normal opacification of the nerve roots and cauda equina consistent with free  flow within the subarachnoid space. The patient was then moved to the trendelenburg position and contrast flowed into the Cervical spine region.  I personally performed the lumbar puncture and administered the intrathecal contrast. I also personally supervised acquisition of the myelogram images.  TECHNIQUE: Contiguous axial images were obtained through the Cervical spine after the intrathecal infusion of infusion. Coronal and sagittal reconstructions were obtained of the axial image sets.  FINDINGS: CERVICAL MYELOGRAM FINDINGS:  The anterior fusion has been extended to include C3-4. No significant disc  disease is evident at C2-3. A prominent disc osteophyte complex at C6-7 has progressed. There is no abnormal motion with flexion or extension.  CT CERVICAL MYELOGRAM FINDINGS:  The cervical spine is imaged from the skullbase through T3-4. Anterior fusion is solid at C3-4, C4-5, and C5-6. Vertebral body heights alignment are maintained.  C2-3: A mild leftward disc osteophyte complex and left-sided facet hypertrophy is similar to the prior study. There is no significant stenosis.  C3-4: Anterior fusion is now present. The central canal is decompressed. Mild left foraminal narrowing is improved. There is some uncovertebral and facet disease. The posterior elements are now fused bilaterally.  C4-5: Anterior fusion is solid. No residual or recurrent stenosis is present.  C5-6: Anterior fusion is solid. No residual or recurrent stenosis is present.  C6-7: A broad-based disc osteophyte complex is present. There is partial effacement of ventral CSF. Moderate foraminal narrowing bilaterally demonstrate some progression, still worse left than right.  C7-T1: Central osteophytes are present without significant central canal stenosis. There is some uncovertebral disease without significant change or stenosis.  IMPRESSION: 1. Extension of anterior fusion to include C3-4. The  central canal is decompressed. Mild left foraminal narrowing is improved. 2. Solid anterior fusion at C4-5 and C5-6 without residual or recurrent stenosis. 3. Slight progression of broad-based disc osteophyte complex with partial effacement of the ventral CSF and moderate foraminal narrowing, left greater than right. 4. Mild central disc osteophyte and bilateral uncovertebral spurring at C7-T1 without significant stenosis.   Electronically Signed   By: San Morelle M.D.   On: 04/04/2015 13:27    Lumbar DG Myelogram:  Results for orders placed during the hospital encounter of 10/22/16  DG MYELOGRAPHY LUMBAR INJ [place]   Narrative CLINICAL DATA:  Central posterior neck pain extending to the right shoulder. Numbness in the left hand. Low back pain and numbness extending into the lower extremities bilaterally, right greater than left.  FLUOROSCOPY TIME:  Radiation Exposure Index (as provided by the fluoroscopic device): 515.01 uGy*m2  If the device does not provide the exposure index:Fluoroscopy Time: 1 minutes 29 seconds  Number of Acquired Images:  23  PROCEDURE: LUMBAR PUNCTURE FOR CERVICAL AND LUMBAR MYELOGRAM  CERVICAL AND LUMBAR MYELOGRAM  CT CERVICAL MYELOGRAM  CT LUMBAR MYELOGRAM  After thorough discussion of risks and benefits of the procedure including bleeding, infection, injury to nerves, blood vessels, adjacent structures as well as headache and CSF leak, written and oral informed consent was obtained. Consent was obtained by Dr. San Morelle.  Patient was positioned prone on the fluoroscopy table. Local anesthesia was provided with 1% lidocaine without epinephrine after prepped and draped in the usual sterile fashion. Puncture was performed at L2-3 using a 3 1/2 inch 22-gauge spinal needle via left paramedian approach. Using a single pass through the dura, the needle was placed within the thecal sac, with return of clear CSF. 10 mL  of Isovue-M 300 was injected into the thecal sac, with normal opacification of the nerve roots and cauda equina consistent with free flow within the subarachnoid space. The patient was then moved to the trendelenburg position and contrast flowed into the Cervical spine region.  I personally performed the lumbar puncture and administered the intrathecal contrast. I also personally supervised acquisition of the myelogram images.  TECHNIQUE: Contiguous axial images were obtained through the Cervical and Lumbar spine after the intrathecal infusion of infusion. Coronal and sagittal reconstructions were obtained of the axial image sets.  FINDINGS: CERVICAL AND LUMBAR MYELOGRAM  FINDINGS:  A a broad-based disc protrusion is present at L5-S1 with subarticular narrowing bilaterally, left greater than right. There is early truncation of the L5 nerve roots, also worse on the left. The distal S1 nerve roots are normal. Grade 1 anterolisthesis is present at L4-5 with uncovering of a broad-based disc protrusion. Minimal subarticular narrowing is present. Mild disc bulging is present at L3-4.  There is no significant change in the anterolisthesis at L4-5 with standing, flexion, or extension. The disc protrusion at L3-4 is exaggerated with standing.  Contrast is poorly seen within the cervical spine. Fusion is solid. Cervical fusion is evident C3-4 through T1-2. AP alignment is present.  CT CERVICAL MYELOGRAM FINDINGS:  Cervical spine is imaged from the skullbase through T2-3.  Solid fusion at C3-4, C4-5, and C5-6 is again noted. There is lucency surrounding disc spacer at C6-7 without evidence of bridging bone. There is bridging bone across the C7-T1 level. The plate is separated from the T1 vertebral body anteriorly without significant interval change.  The soft tissues of the neck are otherwise unremarkable.  C2-3: A leftward disc osteophyte complex and asymmetric facet spurring  results in mild left central and moderate left foraminal stenosis.  C3-4: Left-sided uncovertebral and facet disease results in moderate low foraminal narrowing.  C4-5: Solid anterior fusion is present. No residual recurrent stenosis is evident.  C5-6: Solid anterior fusion is present. Mild facet hypertrophy is noted. There is no residual recurrent stenosis.  C6-7: There is no bridging bone. Lucency remains across the disc spacer. Facet hypertrophy and uncovertebral spurring contribute to moderate residual foraminal stenosis left greater than right.  C7-T1: There is bridging bone across the disc space. Facet hypertrophy contributes to mild foraminal narrowing bilaterally, left greater than right.  The lung apices are clear.  CT LUMBAR MYELOGRAM FINDINGS:  The lumbar spine is imaged from the midbody of T12 through S3-4. Slight anterolisthesis at L4-5 is reduced in the supine position. AP alignment is otherwise anatomic. Vertebral body heights are normal. Mild rightward curvature is centered at L1. There is leftward curvature at L5-S1.  Atherosclerotic calcifications are present within the aorta without aneurysm. No other focal lesions are present. There is no significant adenopathy.  L1-2:  Negative.  L2-3:  Negative.  L3-4: A broad-based disc protrusion is present. Moderate facet hypertrophy and ligamentum flavum thickening is present this results in mild subarticular narrowing bilaterally. The foramina are patent.  L4-5: A broad-based disc protrusion is present. Moderate facet hypertrophy and ligamentum flavum thickening is noted. Moderate subarticular narrowing is present bilaterally. Mild foraminal narrowing is present bilaterally.  L5-S1: There is chronic loss of disc height. A vacuum disc is present. A broad-based disc protrusion is present. Facet spurring is worse on the right. Moderate left and mild right subarticular narrowing is present. Moderate foraminal  stenosis is present bilaterally.  IMPRESSION: 1. Moderate left and mild right subarticular narrowing at L5-S1 with moderate foraminal stenosis bilaterally. 2. Moderate subarticular and mild foraminal stenosis bilaterally at L4-5. This is more evident on CT than myelogram. Anterolisthesis is present on the myelogram but reduced in the prone position on CT. 3. Broad-based disc protrusion with mild subarticular narrowing bilaterally at L3-4. 4. Solid fusion of the cervical spine at C3-4, C4-5, and C5-6. 5. Residual broad-based disc protrusion and facet hypertrophy with moderate foraminal narrowing bilaterally, left greater than right at C6-7. 6. Solid fusion at C7-T1 with mild residual foraminal stenosis bilaterally due to facet spurring. 7. Leftward disc osteophyte complex and facet  spurring at C2-3 with mild left subarticular and moderate left foraminal stenosis.   Electronically Signed   By: San Morelle M.D.   On: 10/22/2016 15:36    Lumbar DG Epidurogram OP:  Results for orders placed during the hospital encounter of 04/10/10  DG Epidurography   Narrative EPIDUROGRAM S+I   Clinical Data: Displacement of cervical intervertebral disc without myelopathy.   Procedure: Right C7-T1 Cervical Interlaminar Epidural Steroid Injection   Fully informed written consent was obtained on the first visit. The patient was placed prone on the fluoroscopic table, the C7-T1 interspace was visualized, and the skin was marked.  After thorough povidone-iodine scrubbing, the site was draped in sterile fashion. 1% lidocaine was used to anesthetize the skin and deeper soft tissues.  A 20-gauge Crawford epidural needle was inserted and advanced into the epidural space using loss of resistance technique with normal saline.  The epidural space was identified on first pass without evidence of blood, cerebrospinal fluid, or paresthesias.  Needle tip placement within the epidural space  was confirmed using 1 mL of non-ionic contrast.  There was no intravascular or subarachnoid opacification.  Next, 60 mg of Kenalog was administered and flushed using 2 mL of normal saline. The needle was restyletted and withdrawn.  The patient tolerated the procedure well and there was no evidence of procedural complication.   The patient was able to ambulate to the recovery area and observed for an appropriate length of time as outlined on the nursing notes. The patient was discharged home with a driver in good condition with detailed patient instructions.   Fluoroscopy time:  24 seconds   IMPRESSION:   Satisfactory interlaminar cervical epidural steroid injection, or right C7-T1.  Provider: Alfredo Batty   Spine Imaging: Whole Spine DG Myelogram views:  Results for orders placed during the hospital encounter of 10/22/16  DG [place]   Narrative CLINICAL DATA:  Central posterior neck pain extending to the right shoulder. Numbness in the left hand. Low back pain and numbness extending into the lower extremities bilaterally, right greater than left.  FLUOROSCOPY TIME:  Radiation Exposure Index (as provided by the fluoroscopic device): 515.01 uGy*m2  If the device does not provide the exposure index:Fluoroscopy Time: 1 minutes 29 seconds  Number of Acquired Images:  23  PROCEDURE: LUMBAR PUNCTURE FOR CERVICAL AND LUMBAR MYELOGRAM  CERVICAL AND LUMBAR MYELOGRAM  CT CERVICAL MYELOGRAM  CT LUMBAR MYELOGRAM  After thorough discussion of risks and benefits of the procedure including bleeding, infection, injury to nerves, blood vessels, adjacent structures as well as headache and CSF leak, written and oral informed consent was obtained. Consent was obtained by Dr. San Morelle.  Patient was positioned prone on the fluoroscopy table. Local anesthesia was provided with 1% lidocaine without epinephrine after prepped and draped in the  usual sterile fashion. Puncture was performed at L2-3 using a 3 1/2 inch 22-gauge spinal needle via left paramedian approach. Using a single pass through the dura, the needle was placed within the thecal sac, with return of clear CSF. 10 mL of Isovue-M 300 was injected into the thecal sac, with normal opacification of the nerve roots and cauda equina consistent with free flow within the subarachnoid space. The patient was then moved to the trendelenburg position and contrast flowed into the Cervical spine region.  I personally performed the lumbar puncture and administered the intrathecal contrast. I also personally supervised acquisition of the myelogram images.  TECHNIQUE: Contiguous axial images were obtained through  the Cervical and Lumbar spine after the intrathecal infusion of infusion. Coronal and sagittal reconstructions were obtained of the axial image sets.  FINDINGS: CERVICAL AND LUMBAR MYELOGRAM FINDINGS:  A a broad-based disc protrusion is present at L5-S1 with subarticular narrowing bilaterally, left greater than right. There is early truncation of the L5 nerve roots, also worse on the left. The distal S1 nerve roots are normal. Grade 1 anterolisthesis is present at L4-5 with uncovering of a broad-based disc protrusion. Minimal subarticular narrowing is present. Mild disc bulging is present at L3-4.  There is no significant change in the anterolisthesis at L4-5 with standing, flexion, or extension. The disc protrusion at L3-4 is exaggerated with standing.  Contrast is poorly seen within the cervical spine. Fusion is solid. Cervical fusion is evident C3-4 through T1-2. AP alignment is present.  CT CERVICAL MYELOGRAM FINDINGS:  Cervical spine is imaged from the skullbase through T2-3.  Solid fusion at C3-4, C4-5, and C5-6 is again noted. There is lucency surrounding disc spacer at C6-7 without evidence of bridging bone. There is bridging bone across the C7-T1  level. The plate is separated from the T1 vertebral body anteriorly without significant interval change.  The soft tissues of the neck are otherwise unremarkable.  C2-3: A leftward disc osteophyte complex and asymmetric facet spurring results in mild left central and moderate left foraminal stenosis.  C3-4: Left-sided uncovertebral and facet disease results in moderate low foraminal narrowing.  C4-5: Solid anterior fusion is present. No residual recurrent stenosis is evident.  C5-6: Solid anterior fusion is present. Mild facet hypertrophy is noted. There is no residual recurrent stenosis.  C6-7: There is no bridging bone. Lucency remains across the disc spacer. Facet hypertrophy and uncovertebral spurring contribute to moderate residual foraminal stenosis left greater than right.  C7-T1: There is bridging bone across the disc space. Facet hypertrophy contributes to mild foraminal narrowing bilaterally, left greater than right.  The lung apices are clear.  CT LUMBAR MYELOGRAM FINDINGS:  The lumbar spine is imaged from the midbody of T12 through S3-4. Slight anterolisthesis at L4-5 is reduced in the supine position. AP alignment is otherwise anatomic. Vertebral body heights are normal. Mild rightward curvature is centered at L1. There is leftward curvature at L5-S1.  Atherosclerotic calcifications are present within the aorta without aneurysm. No other focal lesions are present. There is no significant adenopathy.  L1-2:  Negative.  L2-3:  Negative.  L3-4: A broad-based disc protrusion is present. Moderate facet hypertrophy and ligamentum flavum thickening is present this results in mild subarticular narrowing bilaterally. The foramina are patent.  L4-5: A broad-based disc protrusion is present. Moderate facet hypertrophy and ligamentum flavum thickening is noted. Moderate subarticular narrowing is present bilaterally. Mild foraminal narrowing is present  bilaterally.  L5-S1: There is chronic loss of disc height. A vacuum disc is present. A broad-based disc protrusion is present. Facet spurring is worse on the right. Moderate left and mild right subarticular narrowing is present. Moderate foraminal stenosis is present bilaterally.  IMPRESSION: 1. Moderate left and mild right subarticular narrowing at L5-S1 with moderate foraminal stenosis bilaterally. 2. Moderate subarticular and mild foraminal stenosis bilaterally at L4-5. This is more evident on CT than myelogram. Anterolisthesis is present on the myelogram but reduced in the prone position on CT. 3. Broad-based disc protrusion with mild subarticular narrowing bilaterally at L3-4. 4. Solid fusion of the cervical spine at C3-4, C4-5, and C5-6. 5. Residual broad-based disc protrusion and facet hypertrophy with moderate foraminal narrowing bilaterally,  left greater than right at C6-7. 6. Solid fusion at C7-T1 with mild residual foraminal stenosis bilaterally due to facet spurring. 7. Leftward disc osteophyte complex and facet spurring at C2-3 with mild left subarticular and moderate left foraminal stenosis.   Electronically Signed   By: San Morelle M.D.   On: 10/22/2016 15:36     Epidurography 1:  Results for orders placed during the hospital encounter of 04/10/10  DG Epidurography   Narrative EPIDUROGRAM S+I   Clinical Data: Displacement of cervical intervertebral disc without myelopathy.   Procedure: Right C7-T1 Cervical Interlaminar Epidural Steroid Injection   Fully informed written consent was obtained on the first visit. The patient was placed prone on the fluoroscopic table, the C7-T1 interspace was visualized, and the skin was marked.  After thorough povidone-iodine scrubbing, the site was draped in sterile fashion. 1% lidocaine was used to anesthetize the skin and deeper soft tissues.  A 20-gauge Crawford epidural needle was inserted and advanced into  the epidural space using loss of resistance technique with normal saline.  The epidural space was identified on first pass without evidence of blood, cerebrospinal fluid, or paresthesias.  Needle tip placement within the epidural space was confirmed using 1 mL of non-ionic contrast.  There was no intravascular or subarachnoid opacification.  Next, 60 mg of Kenalog was administered and flushed using 2 mL of normal saline. The needle was restyletted and withdrawn.  The patient tolerated the procedure well and there was no evidence of procedural complication.   The patient was able to ambulate to the recovery area and observed for an appropriate length of time as outlined on the nursing notes. The patient was discharged home with a driver in good condition with detailed patient instructions.   Fluoroscopy time:  24 seconds   IMPRESSION:   Satisfactory interlaminar cervical epidural steroid injection, or right C7-T1.  Provider: Alfredo Batty    Complexity Note: Imaging results reviewed. Results shared with Gabrielle Pineda, using Layman's terms.                         Meds   Current Outpatient Medications:  .  albuterol (PROVENTIL HFA;VENTOLIN HFA) 108 (90 Base) MCG/ACT inhaler, Inhale 2 puffs into the lungs every 6 (six) hours as needed for wheezing or shortness of breath., Disp: 1 Inhaler, Rfl: 6 .  atorvastatin (LIPITOR) 40 MG tablet, TAKE 1 TABLET BY MOUTH ONCE DAILY, Disp: 90 tablet, Rfl: 3 .  b complex vitamins tablet, Take 1 tablet by mouth daily. B12, Disp: , Rfl:  .  FOLIC ACID PO, Take 10 mg tablet twice a day., Disp: , Rfl:  .  gabapentin (NEURONTIN) 600 MG tablet, TAKE 2 TABLETS BY MOUTH AT BEDTIME, Disp: 60 tablet, Rfl: 5 .  guaiFENesin (MUCINEX) 600 MG 12 hr tablet, Take 1,200 mg by mouth 2 (two) times daily., Disp: , Rfl:  .  ibuprofen (ADVIL,MOTRIN) 200 MG tablet, Take 200 mg by mouth every 6 (six) hours as needed. For pain, Disp: , Rfl:  .  meloxicam (MOBIC) 7.5 MG  tablet, Take 7.5 mg by mouth daily., Disp: , Rfl:  .  Multiple Vitamin (MULTIVITAMIN) tablet, Take 1 tablet by mouth daily.  , Disp: , Rfl:  .  Nutritional Supplements (ESTROVEN ENERGY PO), Take by mouth daily., Disp: , Rfl:  .  omeprazole (PRILOSEC) 20 MG capsule, Take 20 mg by mouth daily., Disp: , Rfl:  .  sertraline (ZOLOFT) 100 MG tablet, TAKE 1 TABLET  BY MOUTH ONCE DAILY, Disp: 90 tablet, Rfl: 1 .  traZODone (DESYREL) 50 MG tablet, TAKE 1 TO 2 TABLETS BY MOUTH AT BEDTIME, Disp: 180 tablet, Rfl: 1 .  valACYclovir (VALTREX) 1000 MG tablet, TAKE TWO TABLETS BY MOUTH TWICE DAILY FOR 5 DAYS AS NEEDED, Disp: 40 tablet, Rfl: 3 .  Venlafaxine HCl 75 MG TB24, TAKE 1 TABLET BY MOUTH THREE TIMES DAILY (Patient taking differently: TAKE 3 TABLET BY MOUTH DAILY), Disp: 270 tablet, Rfl: 3 .  amoxicillin-clavulanate (AUGMENTIN) 875-125 MG tablet, Take 1 tablet by mouth 2 (two) times daily. (Patient not taking: Reported on 09/07/2017), Disp: 20 tablet, Rfl: 0 .  busPIRone (BUSPAR) 10 MG tablet, Take 1 tablet (10 mg total) by mouth 2 (two) times daily. (Patient not taking: Reported on 10/19/2017), Disp: 60 tablet, Rfl: 0 .  cyclobenzaprine (FLEXERIL) 10 MG tablet, Take 1 tablet (10 mg total) by mouth 3 (three) times daily as needed for muscle spasms. (Patient not taking: Reported on 09/07/2017), Disp: 90 tablet, Rfl: 0 .  Methotrexate, PF, 25 MG/0.5ML SOAJ, 0.29m once weekly, Disp: 0.5 mL, Rfl: 11 .  varenicline (CHANTIX CONTINUING MONTH PAK) 1 MG tablet, Take 1 tablet (1 mg total) by mouth 2 (two) times daily. (Patient not taking: Reported on 08/02/2017), Disp: 60 tablet, Rfl: 4  Current Facility-Administered Medications:  .  ipratropium-albuterol (DUONEB) 0.5-2.5 (3) MG/3ML nebulizer solution 3 mL, 3 mL, Nebulization, Once, Pollak, Adriana M, PA-C  ROS  Constitutional: Denies any fever or chills Gastrointestinal: No reported hemesis, hematochezia, vomiting, or acute GI distress Musculoskeletal: Denies any  acute onset joint swelling, redness, loss of ROM, or weakness Neurological: No reported episodes of acute onset apraxia, aphasia, dysarthria, agnosia, amnesia, paralysis, loss of coordination, or loss of consciousness  Allergies  Gabrielle Pineda allergic to plaquenil [hydroxychloroquine].  PSunset Drug: Gabrielle Pineda reports that she does not use drugs. Alcohol:  reports that she does not drink alcohol. Tobacco:  reports that she has been smoking.  She has a 20.00 pack-year smoking history. she has never used smokeless tobacco. Medical:  has a past medical history of Anemia, Cervicalgia, Depression, GERD (gastroesophageal reflux disease), Heart murmur, Other and unspecified hyperlipidemia, and Tachycardia. Surgical: Ms. CRodger has a past surgical history that includes neck disc surgery; Breast enhancement surgery (1981); Esophagogastroduodenoscopy; Tonsillectomy and adenoidectomy (1973 ); Carpal tunnel release (11/11/2011); Carpal tunnel release (2013); Rotator cuff repair (06/16/2016); and Rotator cuff repair. Family: family history includes Alcohol abuse in her father and mother; Bipolar disorder in her mother; Pancreatic cancer in her father; Suicidality in her mother.  Constitutional Exam  General appearance: Well nourished, well developed, and well hydrated. In no apparent acute distress Vitals:   10/19/17 1142  BP: (!) 141/66  Pulse: 67  Resp: 16  Temp: 98.3 F (36.8 C)  TempSrc: Oral  SpO2: 100%  Weight: 145 lb (65.8 kg)  Height: 5' 9"  (1.753 m)   BMI Assessment: Estimated body mass index is 21.41 kg/m as calculated from the following:   Height as of this encounter: 5' 9"  (1.753 m).   Weight as of this encounter: 145 lb (65.8 kg).  BMI interpretation table: BMI level Category Range association with higher incidence of chronic pain  <18 kg/m2 Underweight   18.5-24.9 kg/m2 Ideal body weight   25-29.9 kg/m2 Overweight Increased incidence by 20%  30-34.9 kg/m2 Obese (Class  I) Increased incidence by 68%  35-39.9 kg/m2 Severe obesity (Class II) Increased incidence by 136%  >40 kg/m2 Extreme obesity (  Class III) Increased incidence by 254%   BMI Readings from Last 4 Encounters:  10/19/17 21.41 kg/m  10/08/17 22.00 kg/m  09/07/17 21.71 kg/m  08/02/17 23.65 kg/m   Wt Readings from Last 4 Encounters:  10/19/17 145 lb (65.8 kg)  10/08/17 149 lb (67.6 kg)  09/07/17 147 lb (66.7 kg)  08/02/17 151 lb (68.5 kg)  Psych/Mental status: Alert, oriented x 3 (person, place, & time)       Eyes: PERLA Respiratory: No evidence of acute respiratory distress  Cervical Spine Area Exam  Skin & Axial Inspection: Well healed scar from previous spine surgery detected Alignment: Symmetrical Functional ROM: Diminished ROM, bilaterally Stability: No instability detected Muscle Tone/Strength: Functionally intact. No obvious neuro-muscular anomalies detected. Sensory (Neurological): Dermatomal pain pattern Palpation: Complains of area being tender to palpation Positive provocative maneuver for +spurlings on right  Upper Extremity (UE) Exam    Side: Right upper extremity  Side: Left upper extremity  Skin & Extremity Inspection: Skin color, temperature, and hair growth are WNL. No peripheral edema or cyanosis. No masses, redness, swelling, asymmetry, or associated skin lesions. No contractures.  Skin & Extremity Inspection: Skin color, temperature, and hair growth are WNL. No peripheral edema or cyanosis. No masses, redness, swelling, asymmetry, or associated skin lesions. No contractures.  Functional ROM: Unrestricted ROM          Functional ROM: Unrestricted ROM          Muscle Tone/Strength: Functionally intact. No obvious neuro-muscular anomalies detected.  Muscle Tone/Strength: Functionally intact. No obvious neuro-muscular anomalies detected.  Sensory (Neurological): Unimpaired          Sensory (Neurological): Unimpaired          Palpation: No palpable anomalies               Palpation: No palpable anomalies              Specialized Test(s): Deferred         Specialized Test(s): Deferred          Thoracic Spine Area Exam  Skin & Axial Inspection: No masses, redness, or swelling Alignment: Symmetrical Functional ROM: Unrestricted ROM Stability: No instability detected Muscle Tone/Strength: Functionally intact. No obvious neuro-muscular anomalies detected. Sensory (Neurological): Unimpaired Muscle strength & Tone: No palpable anomalies  Lumbar Spine Area Exam  Skin & Axial Inspection: No masses, redness, or swelling Alignment: Symmetrical Functional ROM: Unrestricted ROM      Stability: No instability detected Muscle Tone/Strength: Functionally intact. No obvious neuro-muscular anomalies detected. Sensory (Neurological): Unimpaired Palpation: No palpable anomalies       Provocative Tests: Lumbar Hyperextension and rotation test: evaluation deferred today       Lumbar Lateral bending test: evaluation deferred today       Patrick's Maneuver: evaluation deferred today                    Gait & Posture Assessment  Ambulation: Unassisted Gait: Relatively normal for age and body habitus Posture: WNL   Lower Extremity Exam    Side: Right lower extremity  Side: Left lower extremity  Skin & Extremity Inspection: Skin color, temperature, and hair growth are WNL. No peripheral edema or cyanosis. No masses, redness, swelling, asymmetry, or associated skin lesions. No contractures.  Skin & Extremity Inspection: Skin color, temperature, and hair growth are WNL. No peripheral edema or cyanosis. No masses, redness, swelling, asymmetry, or associated skin lesions. No contractures.  Functional ROM: Unrestricted ROM  Functional ROM: Unrestricted ROM          Muscle Tone/Strength: Functionally intact. No obvious neuro-muscular anomalies detected.  Muscle Tone/Strength: Functionally intact. No obvious neuro-muscular anomalies detected.  Sensory (Neurological):  Unimpaired  Sensory (Neurological): Unimpaired  Palpation: No palpable anomalies  Palpation: No palpable anomalies   Assessment & Plan  Primary Diagnosis & Pertinent Problem List: The primary encounter diagnosis was Cervical radiculopathy. Diagnoses of Cervicalgia, Cervical fusion syndrome, and Chronic pain syndrome were also pertinent to this visit.  Visit Diagnosis: 1. Cervical radiculopathy   2. Cervicalgia   3. Cervical fusion syndrome   4. Chronic pain syndrome    General Recommendations: The pain condition that the patient suffers from is best treated with a multidisciplinary approach that involves an increase in physical activity to prevent de-conditioning and worsening of the pain cycle, as well as psychological counseling (formal and/or informal) to address the co-morbid psychological affects of pain. Treatment will often involve judicious use of pain medications and interventional procedures to decrease the pain, allowing the patient to participate in the physical activity that will ultimately produce long-lasting pain reductions. The goal of the multidisciplinary approach is to return the patient to a higher level of overall function and to restore their ability to perform activities of daily living.  62 year old female with a primary complaint of neck pain that radiates down into bilateral hands.  Patient's past medical history significant for rheumatoid arthritis, cervical radiculopathy and fusion with extensive cervical spine surgery hx as follow: 2012 C3-4 fusion, 2014 C4-6 fusion and 2017 C6-T1 fusion.  Patient recently went to the [facility] in [facility] for a comprehensive cervical spine evaluation.  There she met with neurosurgery, physical medicine, pain medicine to help with her symptoms.  At that time she received a left C7-T1 ESI at the [facility] in [facility] which was effective for her upper extremity radicular symptoms. Patient is also had an EMG  completed which shows length dependent peripheral neuropathy.  Patient also has carpal tunnel syndrome status post carpal tunnel surgery 2015.  Patient has previously tried Flexeril, ibuprofen, meloxicam which are only somewhat helpful.  She takes Zoloft 100 mg daily.  Patient also takes gabapentin 1200 mg nightly. Of note patient has weaned herself off of oxycodone over the last 3 weeks.  I commended her on this.  She states that her clarity and energy levels are improved although she does continue to have moderate pain that she is trying to cope with.  Patient's total opioid dose was approximately 60 MME's- Oxycodone 5 mg, 1-2 tablets up to 4 times daily as needed.,  Quantity 35-month  MME equals 60.  Last fill 08/31/2017, quantity 240.  We discussed cervical epidural steroid injection on the right side for her cervical radicular symptoms.  She does have a positive Spurling's on the right as well as the left but is more pronounced on the right.  Risks and benefits of cervical epidural steroid injection were discussed and patient would like to proceed.  Plan: -Right C7-T1 ESI for cervical radiculopathy with sedation and fluoro  -Continue gabapentin and Effexor as prescribed.  Lab-work, procedure(s), and/or referral(s): Orders Placed This Encounter  Procedures  . Cervical Epidural Injection    Provider-requested follow-up: Return in about 1 week (around 10/26/2017). Time Note: Greater than 50% of the 25 minute(s) of face-to-face time spent with Gabrielle Pineda was spent in counseling/coordination of care regarding: Ms. CSikkemaprimary cause of pain, the treatment plan, treatment alternatives, the risks and  possible complications of proposed treatment and realistic expectations.   Future Appointments  Date Time Provider Page  10/27/2017 10:15 AM [name], MD [facility] None    Primary Care Physician: Birdie Sons, MD Location: [facility] Out[facility] Pain Management  Facility Note by: [name], M.D Date: 10/19/2017; Time: 2:17 PM  Patient Instructions  Epidural Steroid Injection Patient Information  Description: The epidural space surrounds the nerves as they exit the spinal cord.  In some patients, the nerves can be compressed and inflamed by a bulging disc or a tight spinal canal (spinal stenosis).  By injecting steroids into the epidural space, we can bring irritated nerves into direct contact with a potentially helpful medication.  These steroids act directly on the irritated nerves and can reduce swelling and inflammation which often leads to decreased pain.  Epidural steroids may be injected anywhere along the spine and from the neck to the low back depending upon the location of your pain.   After numbing the skin with local anesthetic (like Novocaine), a small needle is passed into the epidural space slowly.  You may experience a sensation of pressure while this is being done.  The entire block usually last less than 10 minutes.  Conditions which may be treated by epidural steroids:   Low back and leg pain  Neck and arm pain  Spinal stenosis  Post-laminectomy syndrome  Herpes zoster (shingles) pain  Pain from compression fractures  Preparation for the injection:  1. Do not eat any solid food or dairy products within 8 hours of your appointment.  2. You may drink clear liquids up to 3 hours before appointment.  Clear liquids include water, black coffee, juice or soda.  No milk or cream please. 3. You may take your regular medication, including pain medications, with a sip of water before your appointment  Diabetics should hold regular insulin (if taken separately) and take 1/2 normal NPH dos the morning of the procedure.  Carry some sugar containing items with you to your appointment. 4. A driver must accompany you and be prepared to drive you home after your procedure.  5. Bring all your current medications with your. 6. An IV may be  inserted and sedation may be given at the discretion of the physician.   7. A blood pressure cuff, EKG and other monitors will often be applied during the procedure.  Some patients may need to have extra oxygen administered for a short period. 8. You will be asked to provide medical information, including your allergies, prior to the procedure.  We must know immediately if you are taking blood thinners (like Coumadin/Warfarin)  Or if you are allergic to IV iodine contrast (dye). We must know if you could possible be pregnant.  Possible side-effects:  Bleeding from needle site  Infection (rare, may require surgery)  Nerve injury (rare)  Numbness & tingling (temporary)  Difficulty urinating (rare, temporary)  Spinal headache ( a headache worse with upright posture)  Light -headedness (temporary)  Pain at injection site (several days)  Decreased blood pressure (temporary)  Weakness in arm/leg (temporary)  Pressure sensation in back/neck (temporary)  Call if you experience:  Fever/chills associated with headache or increased back/neck pain.  Headache worsened by an upright position.  New onset weakness or numbness of an extremity below the injection site  Hives or difficulty breathing (go to the emergency room)  Inflammation or drainage at the infection site  Severe back/neck pain  Any new symptoms which are concerning to  you  Please note:  Although the local anesthetic injected can often make your back or neck feel good for several hours after the injection, the pain will likely return.  It takes 3-7 days for steroids to work in the epidural space.  You may not notice any pain relief for at least that one week.  If effective, we will often do a series of three injections spaced 3-6 weeks apart to maximally decrease your pain.  After the initial series, we generally will wait several months before considering a repeat injection of the same type.  If you have any  questions, please call [phone] [facility]

## 2017-10-27 ENCOUNTER — Ambulatory Visit (HOSPITAL_BASED_OUTPATIENT_CLINIC_OR_DEPARTMENT_OTHER): Payer: BLUE CROSS/BLUE SHIELD | Admitting: Student in an Organized Health Care Education/Training Program

## 2017-10-27 ENCOUNTER — Encounter: Payer: Self-pay | Admitting: Student in an Organized Health Care Education/Training Program

## 2017-10-27 ENCOUNTER — Other Ambulatory Visit: Payer: Self-pay

## 2017-10-27 ENCOUNTER — Ambulatory Visit
Admission: RE | Admit: 2017-10-27 | Discharge: 2017-10-27 | Disposition: A | Discharge status: Home / Self Care | Payer: BLUE CROSS/BLUE SHIELD | Source: Ambulatory Visit | Attending: Student in an Organized Health Care Education/Training Program | Admitting: Student in an Organized Health Care Education/Training Program

## 2017-10-27 VITALS — BP 138/84 | HR 90 | Temp 98.6°F | Resp 18 | Ht 69.0 in | Wt 145.0 lb

## 2017-10-27 DIAGNOSIS — M5412 Radiculopathy, cervical region: Secondary | ICD-10-CM | POA: Diagnosis present

## 2017-10-27 DIAGNOSIS — M25512 Pain in left shoulder: Secondary | ICD-10-CM | POA: Diagnosis not present

## 2017-10-27 DIAGNOSIS — Z79899 Other long term (current) drug therapy: Secondary | ICD-10-CM | POA: Insufficient documentation

## 2017-10-27 DIAGNOSIS — M25511 Pain in right shoulder: Secondary | ICD-10-CM | POA: Diagnosis not present

## 2017-10-27 MED ORDER — FENTANYL CITRATE (PF) 100 MCG/2ML IJ SOLN
25.0000 ug | INTRAMUSCULAR | Status: DC | PRN
  Administered 2017-10-27: 50 ug via INTRAVENOUS
  Filled 2017-10-27: qty 2

## 2017-10-27 MED ORDER — DEXAMETHASONE SODIUM PHOSPHATE 10 MG/ML IJ SOLN
10.0000 mg | Freq: Once | INTRAMUSCULAR | Status: AC
  Administered 2017-10-27: 10 mg
  Filled 2017-10-27: qty 1

## 2017-10-27 MED ORDER — ROPIVACAINE HCL 2 MG/ML IJ SOLN
1.0000 mL | Freq: Once | INTRAMUSCULAR | Status: AC
  Administered 2017-10-27: 10 mL via EPIDURAL
  Filled 2017-10-27: qty 10

## 2017-10-27 MED ORDER — LACTATED RINGERS IV SOLN
1000.0000 mL | Freq: Once | INTRAVENOUS | Status: AC
  Administered 2017-10-27: 1000 mL via INTRAVENOUS

## 2017-10-27 MED ORDER — SODIUM CHLORIDE 0.9% FLUSH
1.0000 mL | Freq: Once | INTRAVENOUS | Status: AC
  Administered 2017-10-27: 10 mL

## 2017-10-27 MED ORDER — IOPAMIDOL (ISOVUE-M 200) INJECTION 41%
10.0000 mL | Freq: Once | INTRAMUSCULAR | Status: AC
  Administered 2017-10-27: 10 mL via EPIDURAL
  Filled 2017-10-27: qty 10

## 2017-10-27 MED ORDER — LIDOCAINE HCL (PF) 1 % IJ SOLN
4.5000 mL | Freq: Once | INTRAMUSCULAR | Status: AC
  Administered 2017-10-27: 5 mL
  Filled 2017-10-27: qty 5

## 2017-10-27 NOTE — Progress Notes (Signed)
Patient's Name: Gabrielle Pineda  MRN: [id number]  Referring Provider: Birdie Sons, MD  DOB: August 23, 1955  PCP: Birdie Sons, MD  DOS: 10/27/2017  Note by: [name], MD  Service setting: Ambulatory outpatient  Specialty: Interventional Pain Management  Patient type: Established  Location: [facility] (AMB) Pain Management Facility  Visit type: Interventional Procedure   Primary Reason for Visit: Interventional Pain Management Treatment. CC: Neck Pain and Shoulder Pain (bilaterally; right is worse)  Procedure:       Anesthesia, Analgesia, Anxiolysis:  Type: Diagnostic, Inter-Laminar, Epidural Steroid Injection #1  Region: Posterior Cervico-thoracic Region Level: C7-T1 Laterality: Right-Sided Paramedial  Type: Moderate (Conscious) Sedation combined with Local Anesthesia Indication(s): Analgesia and Anxiety Route: Intravenous (IV) IV Access: Secured Sedation: Meaningful verbal contact was maintained at all times during the procedure  Local Anesthetic: Lidocaine 1-2%   Indications: 1. Cervical radiculopathy    Pain Score: Pre-procedure: 8 /10 Post-procedure: 0-No pain/10  Pre-op Assessment:  Gabrielle Pineda is a 62 y.o. (year old), female patient, seen today for interventional treatment. She  has a past surgical history that includes neck disc surgery; Breast enhancement surgery (1981); Esophagogastroduodenoscopy; Tonsillectomy and adenoidectomy (1973 ); Carpal tunnel release (11/11/2011); Carpal tunnel release (2013); Rotator cuff repair (06/16/2016); and Rotator cuff repair. Gabrielle Pineda has a current medication list which includes the following prescription(s): albuterol, atorvastatin, b complex vitamins, gabapentin, guaifenesin, ibuprofen, meloxicam, multivitamin, misc natural products, sertraline, trazodone, valacyclovir, venlafaxine hcl, amoxicillin-clavulanate, buspirone, cyclobenzaprine, folic acid, methotrexate (pf), omeprazole, varenicline, and potassium chloride sa, and the  following Facility-Administered Medications: fentanyl and ipratropium-albuterol. Her primarily concern today is the Neck Pain and Shoulder Pain (bilaterally; right is worse)  5 out of 5 strength bilateral upper extremity: Shoulder abduction, elbow flexion, elbow extension, thumb extension.   Initial Vital Signs:  Pulse Rate: 90 Temp: 98.6 F (37 C) Resp: 16 BP: 116/83 SpO2: 94 %  BMI: Estimated body mass index is 21.41 kg/m as calculated from the following:   Height as of this encounter: 5\' 9"  (1.753 m).   Weight as of this encounter: 145 lb (65.8 kg).  Risk Assessment: Allergies: Reviewed. She is allergic to plaquenil [hydroxychloroquine].  Allergy Precautions: None required Coagulopathies: Reviewed. None identified.  Blood-thinner therapy: None at this time Active Infection(s): Reviewed. None identified. Gabrielle Pineda is afebrile  Site Confirmation: Gabrielle Pineda was asked to confirm the procedure and laterality before marking the site Procedure checklist: Completed Consent: Before the procedure and under the influence of no sedative(s), amnesic(s), or anxiolytics, the patient was informed of the treatment options, risks and possible complications. To fulfill our ethical and legal obligations, as recommended by the American Medical Association's Code of Ethics, I have informed the patient of my clinical impression; the nature and purpose of the treatment or procedure; the risks, benefits, and possible complications of the intervention; the alternatives, including doing nothing; the risk(s) and benefit(s) of the alternative treatment(s) or procedure(s); and the risk(s) and benefit(s) of doing nothing. The patient was provided information about the general risks and possible complications associated with the procedure. These may include, but are not limited to: failure to achieve desired goals, infection, bleeding, organ or nerve damage, allergic reactions, paralysis, and death. In  addition, the patient was informed of those risks and complications associated to Spine-related procedures, such as failure to decrease pain; infection (i.e.: Meningitis, epidural or intraspinal abscess); bleeding (i.e.: epidural hematoma, subarachnoid hemorrhage, or any other type of intraspinal or peri-dural bleeding); organ or nerve damage (i.e.: Any type of  peripheral nerve, nerve root, or spinal cord injury) with subsequent damage to sensory, motor, and/or autonomic systems, resulting in permanent pain, numbness, and/or weakness of one or several areas of the body; allergic reactions; (i.e.: anaphylactic reaction); and/or death. Furthermore, the patient was informed of those risks and complications associated with the medications. These include, but are not limited to: allergic reactions (i.e.: anaphylactic or anaphylactoid reaction(s)); adrenal axis suppression; blood sugar elevation that in diabetics may result in ketoacidosis or comma; water retention that in patients with history of congestive heart failure may result in shortness of breath, pulmonary edema, and decompensation with resultant heart failure; weight gain; swelling or edema; medication-induced neural toxicity; particulate matter embolism and blood vessel occlusion with resultant organ, and/or nervous system infarction; and/or aseptic necrosis of one or more joints. Finally, the patient was informed that Medicine is not an exact science; therefore, there is also the possibility of unforeseen or unpredictable risks and/or possible complications that may result in a catastrophic outcome. The patient indicated having understood very clearly. We have given the patient no guarantees and we have made no promises. Enough time was given to the patient to ask questions, all of which were answered to the patient's satisfaction. Gabrielle Pineda has indicated that she wanted to continue with the procedure. Attestation: I, the ordering provider, attest that  I have discussed with the patient the benefits, risks, side-effects, alternatives, likelihood of achieving goals, and potential problems during recovery for the procedure that I have provided informed consent. Date  Time: 10/27/2017 10:36 AM  Pre-Procedure Preparation:  Monitoring: As per clinic protocol. Respiration, ETCO2, SpO2, BP, heart rate and rhythm monitor placed and checked for adequate function Safety Precautions: Patient was assessed for positional comfort and pressure points before starting the procedure. Time-out: I initiated and conducted the "Time-out" before starting the procedure, as per protocol. The patient was asked to participate by confirming the accuracy of the "Time Out" information. Verification of the correct person, site, and procedure were performed and confirmed by me, the nursing staff, and the patient. "Time-out" conducted as per Joint Commission's Universal Protocol (UP.01.01.01). Time: 1113  Description of Procedure Process:   Position: Prone with head of the table was raised to facilitate breathing. Target Area: For Epidural Steroid injections the target is the interlaminar space, initially targeting the lower border of the superior vertebral body lamina. Approach: Paramedial approach. Area Prepped: Entire PosteriorCervical Region Prepping solution: ChloraPrep (2% chlorhexidine gluconate and 70% isopropyl alcohol) Safety Precautions: Aspiration looking for blood return was conducted prior to all injections. At no point did we inject any substances, as a needle was being advanced. No attempts were made at seeking any paresthesias. Safe injection practices and needle disposal techniques used. Medications properly checked for expiration dates. SDV (single dose vial) medications used. Description of the Procedure: Protocol guidelines were followed. The procedure needle was introduced through the skin, ipsilateral to the reported pain, and advanced to the target area.  Bone was contacted and the needle walked caudad, until the lamina was cleared. The epidural space was identified using "loss-of-resistance technique" with 2-3 ml of PF-NaCl (0.9% NSS), in a 5cc LOR glass syringe. Vitals:   10/27/17 1125 10/27/17 1135 10/27/17 1144 10/27/17 1154  BP: (!) 138/100 (!) 144/80 (!) 141/84 138/84  Pulse:      Resp: 20 19 18 18   Temp:      TempSrc:      SpO2: 97% 96% 95% 96%  Weight:      Height:  Start Time: 1113 hrs. End Time: 1123 hrs. Materials:  Needle(s) Type: Epidural needle Gauge: 17G Length: 3.5-in Medication(s): Please see orders for medications and dosing details. 3.5 cc solution made of 2 cc of PF NS, 0.5 cc of 0.2% Ropivacaine, and 1 cc of Decadron 10 mg/cc  Imaging Guidance (Spinal):  Type of Imaging Technique: Fluoroscopy Guidance (Spinal) Indication(s): Assistance in needle guidance and placement for procedures requiring needle placement in or near specific anatomical locations not easily accessible without such assistance. Exposure Time: Please see nurses notes. Contrast: Before injecting any contrast, we confirmed that the patient did not have an allergy to iodine, shellfish, or radiological contrast. Once satisfactory needle placement was completed at the desired level, radiological contrast was injected. Contrast injected under live fluoroscopy. No contrast complications. See chart for type and volume of contrast used. Fluoroscopic Guidance: I was personally present during the use of fluoroscopy. "Tunnel Vision Technique" used to obtain the best possible view of the target area. Parallax error corrected before commencing the procedure. "Direction-depth-direction" technique used to introduce the needle under continuous pulsed fluoroscopy. Once target was reached, antero-posterior, oblique, and lateral fluoroscopic projection used confirm needle placement in all planes. Images permanently stored in EMR. Interpretation: I personally  interpreted the imaging intraoperatively. Adequate needle placement confirmed in multiple planes. Appropriate spread of contrast into desired area was observed. No evidence of afferent or efferent intravascular uptake. No intrathecal or subarachnoid spread observed. Permanent images saved into the patient's record.  Antibiotic Prophylaxis:   Anti-infectives (From admission, onward)   None     Indication(s): None identified  Post-operative Assessment:  Post-procedure Vital Signs:  Pulse Rate: 90 Temp: 98.6 F (37 C) Resp: 18 BP: 138/84 SpO2: 96 %  EBL: None  Complications: No immediate post-treatment complications observed by team, or reported by patient.  Note: The patient tolerated the entire procedure well. A repeat set of vitals were taken after the procedure and the patient was kept under observation following institutional policy, for this type of procedure. Post-procedural neurological assessment was performed, showing return to baseline, prior to discharge. The patient was provided with post-procedure discharge instructions, including a section on how to identify potential problems. Should any problems arise concerning this procedure, the patient was given instructions to immediately contact us, at any time, without hesitation. In any case, we plan to contact the patient by telephone for a follow-up status report regarding this interventional procedure.  Comments:  No additional relevant information. 5 out of 5 strength bilateral upper extremity: Shoulder abduction, elbow flexion, elbow extension, thumb extension. Improved cervical ROM Plan of Care    Imaging Orders     DG C-Arm 1-60 Min-No Report Procedure Orders    No procedure(s) ordered today    Medications ordered for procedure: Meds ordered this encounter  Medications  . lactated ringers infusion 1,000 mL  . fentaNYL (SUBLIMAZE) injection 25-100 mcg    Make sure Narcan is available in the pyxis when using this  medication. In the event of respiratory depression (RR< 8/min): Titrate NARCAN (naloxone) in increments of 0.1 to 0.2 mg IV at 2-3 minute intervals, until desired degree of reversal.  . iopamidol (ISOVUE-M) 41 % intrathecal injection 10 mL  . dexamethasone (DECADRON) injection 10 mg  . ropivacaine (PF) 2 mg/mL (0.2%) (NAROPIN) injection 1 mL  . sodium chloride flush (NS) 0.9 % injection 1 mL  . lidocaine (PF) (XYLOCAINE) 1 % injection 4.5 mL   Medications administered: We administered lactated ringers, fentaNYL, iopamidol, dexamethasone, ropivacaine (PF)  2 mg/mL (0.2%), sodium chloride flush, and lidocaine (PF).  See the medical record for exact dosing, route, and time of administration.  New Prescriptions   No medications on file   Disposition: Discharge home  Discharge Date & Time: 10/27/2017; 1200 hrs.   Physician-requested Follow-up: Return in about 4 weeks (around 11/24/2017) for Post Procedure Evaluation.  Future Appointments  Date Time Provider [place]  11/23/2017 10:30 AM [name], MD [facility] None   Primary Care Physician: Birdie Sons, MD Location: [facility] Note by: [name], MD Date: 10/27/2017; Time: 1:16 PM  Disclaimer:  Medicine is not an exact science. The only guarantee in medicine is that nothing is guaranteed. It is important to note that the decision to proceed with this intervention was based on the information collected from the patient. The Data and conclusions were drawn from the patient's questionnaire, the interview, and the physical examination. Because the information was provided in large part by the patient, it cannot be guaranteed that it has not been purposely or unconsciously manipulated. Every effort has been made to obtain as much relevant data as possible for this evaluation. It is important to note that the conclusions that lead to this procedure are derived in large part from the available data.  Always take into account that the treatment will also be dependent on availability of resources and existing treatment guidelines, considered by other Pain Management Practitioners as being common knowledge and practice, at the time of the intervention. For Medico-Legal purposes, it is also important to point out that variation in procedural techniques and pharmacological choices are the acceptable norm. The indications, contraindications, technique, and results of the above procedure should only be interpreted and judged by a Board-Certified Interventional Pain Specialist with extensive familiarity and expertise in the same exact procedure and technique.

## 2017-10-27 NOTE — Progress Notes (Signed)
Safety precautions to be maintained throughout the outpatient stay will include: orient to surroundings, keep bed in low position, maintain call bell within reach at all times, provide assistance with transfer out of bed and ambulation.  

## 2017-10-27 NOTE — Patient Instructions (Signed)
Epidural Steroid Injection Patient Information  Description: The epidural space surrounds the nerves as they exit the spinal cord.  In some patients, the nerves can be compressed and inflamed by a bulging disc or a tight spinal canal (spinal stenosis).  By injecting steroids into the epidural space, we can bring irritated nerves into direct contact with a potentially helpful medication.  These steroids act directly on the irritated nerves and can reduce swelling and inflammation which often leads to decreased pain.  Epidural steroids may be injected anywhere along the spine and from the neck to the low back depending upon the location of your pain.   After numbing the skin with local anesthetic (like Novocaine), a small needle is passed into the epidural space slowly.  You may experience a sensation of pressure while this is being done.  The entire block usually last less than 10 minutes.  Conditions which may be treated by epidural steroids:   Low back and leg pain  Neck and arm pain  Spinal stenosis  Post-laminectomy syndrome  Herpes zoster (shingles) pain  Pain from compression fractures  Preparation for the injection:  1. Do not eat any solid food or dairy products within 8 hours of your appointment.  2. You may drink clear liquids up to 3 hours before appointment.  Clear liquids include water, black coffee, juice or soda.  No milk or cream please. 3. You may take your regular medication, including pain medications, with a sip of water before your appointment  Diabetics should hold regular insulin (if taken separately) and take 1/2 normal NPH dos the morning of the procedure.  Carry some sugar containing items with you to your appointment. 4. A driver must accompany you and be prepared to drive you home after your procedure.  5. Bring all your current medications with your. 6. An IV may be inserted and sedation may be given at the discretion of the physician.   7. A blood pressure  cuff, EKG and other monitors will often be applied during the procedure.  Some patients may need to have extra oxygen administered for a short period. 8. You will be asked to provide medical information, including your allergies, prior to the procedure.  We must know immediately if you are taking blood thinners (like Coumadin/Warfarin)  Or if you are allergic to IV iodine contrast (dye). We must know if you could possible be pregnant.  Possible side-effects:  Bleeding from needle site  Infection (rare, may require surgery)  Nerve injury (rare)  Numbness & tingling (temporary)  Difficulty urinating (rare, temporary)  Spinal headache ( a headache worse with upright posture)  Light -headedness (temporary)  Pain at injection site (several days)  Decreased blood pressure (temporary)  Weakness in arm/leg (temporary)  Pressure sensation in back/neck (temporary)  Call if you experience:  Fever/chills associated with headache or increased back/neck pain.  Headache worsened by an upright position.  New onset weakness or numbness of an extremity below the injection site  Hives or difficulty breathing (go to the emergency room)  Inflammation or drainage at the infection site  Severe back/neck pain  Any new symptoms which are concerning to you  Please note:  Although the local anesthetic injected can often make your back or neck feel good for several hours after the injection, the pain will likely return.  It takes 3-7 days for steroids to work in the epidural space.  You may not notice any pain relief for at least that one week.    If effective, we will often do a series of three injections spaced 3-6 weeks apart to maximally decrease your pain.  After the initial series, we generally will wait several months before considering a repeat injection of the same type.  If you have any questions, please call [phone] [facility]  Discharge Instructions  General Discharge Instructions :  If you need to reach your doctor call: Monday-Friday 8:00 am - 4:00 pm at [phone] or toll free [phone].  After clinic hours [phone] to have operator reach doctor.  Bring all of your medication bottles to all your appointments in the pain clinic.  To cancel or reschedule your appointment with Pain Management please remember to call 24 hours in advance to avoid a fee.  Refer to the educational materials which you have been given on: General Risks, I had my Procedure. Discharge Instructions, Post Sedation.  Post Procedure Instructions:  The drugs you were given will stay in your system until tomorrow, so for the next 24 hours you should not drive, make any legal decisions or drink any alcoholic beverages.  You may eat anything you prefer, but it is better to start with liquids then soups and crackers, and gradually work up to solid foods.  Please notify your doctor immediately if you have any unusual bleeding, trouble breathing or pain that is not related to your normal pain.  Depending on the type of procedure that was done, some parts of your body may feel week and/or numb.  This usually clears up by tonight or the next day.  Walk with the use of an assistive device or accompanied by an adult for the 24 hours.  You may use ice on the affected area for the first 24 hours.  Put ice in a Ziploc bag and cover with a towel and place against area 15 minutes on 15 minutes off.  You may switch to heat after 24 hours. 

## 2017-10-28 ENCOUNTER — Telehealth: Payer: Self-pay | Admitting: *Deleted

## 2017-10-28 NOTE — Telephone Encounter (Signed)
Denies complications post procedure. 

## 2017-10-29 ENCOUNTER — Encounter: Payer: Self-pay | Admitting: Student

## 2017-11-01 ENCOUNTER — Encounter
Admission: RE | Disposition: A | Discharge status: Home / Self Care | Payer: Self-pay | Source: Ambulatory Visit | Attending: Unknown Physician Specialty

## 2017-11-01 ENCOUNTER — Ambulatory Visit: Payer: BLUE CROSS/BLUE SHIELD | Admitting: Anesthesiology

## 2017-11-01 ENCOUNTER — Encounter: Payer: Self-pay | Admitting: *Deleted

## 2017-11-01 ENCOUNTER — Ambulatory Visit
Admission: RE | Admit: 2017-11-01 | Discharge: 2017-11-01 | Disposition: A | Discharge status: Home / Self Care | Payer: BLUE CROSS/BLUE SHIELD | Source: Ambulatory Visit | Attending: Unknown Physician Specialty | Admitting: Unknown Physician Specialty

## 2017-11-01 DIAGNOSIS — M1990 Unspecified osteoarthritis, unspecified site: Secondary | ICD-10-CM | POA: Insufficient documentation

## 2017-11-01 DIAGNOSIS — K648 Other hemorrhoids: Secondary | ICD-10-CM | POA: Diagnosis not present

## 2017-11-01 DIAGNOSIS — F17210 Nicotine dependence, cigarettes, uncomplicated: Secondary | ICD-10-CM | POA: Insufficient documentation

## 2017-11-01 DIAGNOSIS — J449 Chronic obstructive pulmonary disease, unspecified: Secondary | ICD-10-CM | POA: Insufficient documentation

## 2017-11-01 DIAGNOSIS — D125 Benign neoplasm of sigmoid colon: Secondary | ICD-10-CM | POA: Insufficient documentation

## 2017-11-01 DIAGNOSIS — K219 Gastro-esophageal reflux disease without esophagitis: Secondary | ICD-10-CM | POA: Insufficient documentation

## 2017-11-01 DIAGNOSIS — Z1211 Encounter for screening for malignant neoplasm of colon: Secondary | ICD-10-CM | POA: Insufficient documentation

## 2017-11-01 DIAGNOSIS — Z79899 Other long term (current) drug therapy: Secondary | ICD-10-CM | POA: Diagnosis not present

## 2017-11-01 DIAGNOSIS — E785 Hyperlipidemia, unspecified: Secondary | ICD-10-CM | POA: Insufficient documentation

## 2017-11-01 DIAGNOSIS — Z86010 Personal history of colonic polyps: Secondary | ICD-10-CM | POA: Diagnosis not present

## 2017-11-01 HISTORY — PX: COLONOSCOPY WITH PROPOFOL: SHX5780

## 2017-11-01 HISTORY — DX: Chronic obstructive pulmonary disease, unspecified: J44.9

## 2017-11-01 HISTORY — DX: Unspecified osteoarthritis, unspecified site: M19.90

## 2017-11-01 HISTORY — DX: Alcohol abuse, uncomplicated: F10.10

## 2017-11-01 SURGERY — COLONOSCOPY WITH PROPOFOL
Anesthesia: General

## 2017-11-01 MED ORDER — SODIUM CHLORIDE 0.9 % IV SOLN
INTRAVENOUS | Status: DC
  Administered 2017-11-01: 14:00:00 via INTRAVENOUS

## 2017-11-01 MED ORDER — MIDAZOLAM HCL 2 MG/2ML IJ SOLN
INTRAMUSCULAR | Status: AC
  Filled 2017-11-01: qty 2

## 2017-11-01 MED ORDER — IPRATROPIUM-ALBUTEROL 0.5-2.5 (3) MG/3ML IN SOLN
3.0000 mL | Freq: Four times a day (QID) | RESPIRATORY_TRACT | Status: DC
  Administered 2017-11-01: 3 mL via RESPIRATORY_TRACT

## 2017-11-01 MED ORDER — IPRATROPIUM-ALBUTEROL 0.5-2.5 (3) MG/3ML IN SOLN
RESPIRATORY_TRACT | Status: AC
  Administered 2017-11-01: 3 mL via RESPIRATORY_TRACT
  Filled 2017-11-01: qty 3

## 2017-11-01 MED ORDER — PROPOFOL 500 MG/50ML IV EMUL
INTRAVENOUS | Status: AC
  Filled 2017-11-01: qty 50

## 2017-11-01 MED ORDER — FENTANYL CITRATE (PF) 100 MCG/2ML IJ SOLN
INTRAMUSCULAR | Status: AC
  Filled 2017-11-01: qty 2

## 2017-11-01 MED ORDER — FENTANYL CITRATE (PF) 100 MCG/2ML IJ SOLN
INTRAMUSCULAR | Status: DC | PRN
  Administered 2017-11-01 (×2): 50 ug via INTRAVENOUS

## 2017-11-01 MED ORDER — PROPOFOL 500 MG/50ML IV EMUL
INTRAVENOUS | Status: DC | PRN
  Administered 2017-11-01: 140 ug/kg/min via INTRAVENOUS

## 2017-11-01 MED ORDER — MIDAZOLAM HCL 2 MG/2ML IJ SOLN
INTRAMUSCULAR | Status: DC | PRN
  Administered 2017-11-01: 2 mg via INTRAVENOUS

## 2017-11-01 NOTE — H&P (Signed)
Primary Care Physician:  Birdie Sons, MD Primary Gastroenterologist:  Dr. Vira Agar  Pre-Procedure History & Physical: HPI:  Gabrielle Pineda is a 62 y.o. female is here for an colonoscopy for personal history of colon polyps.   Past Medical History:  Diagnosis Date  . Alcohol abuse   . Anemia   . Arthritis   . Cervicalgia   . COPD (chronic obstructive pulmonary disease) ([place])   . Depression   . GERD (gastroesophageal reflux disease)   . Heart murmur    on heard when pt is lying  . Other and unspecified hyperlipidemia   . Tachycardia    d/t questionable anxiety happens every 5- 10 years, sees [facility]    Past Surgical History:  Procedure Laterality Date  . BREAST ENHANCEMENT SURGERY  1981  . CARPAL TUNNEL RELEASE  11/11/2011   Procedure: CARPAL TUNNEL RELEASE;  Surgeon: Floyce Stakes, MD;  Location: [facility];  Service: Neurosurgery;  Laterality: Right;  Right Median Nerve Decompression  . CARPAL TUNNEL RELEASE  2013  . ESOPHAGOGASTRODUODENOSCOPY    . neck disc surgery     plates and screws in neck x 2  . ROTATOR CUFF REPAIR  06/16/2016   right shoulder   . ROTATOR CUFF REPAIR    . TONSILLECTOMY AND ADENOIDECTOMY  1973     Prior to Admission medications   Medication Sig Start Date End Date Taking? Authorizing Provider  albuterol (PROVENTIL HFA;VENTOLIN HFA) 108 (90 Base) MCG/ACT inhaler Inhale 2 puffs into the lungs every 6 (six) hours as needed for wheezing or shortness of breath. 07/07/17  Yes [name], PA-C  atorvastatin (LIPITOR) 40 MG tablet TAKE 1 TABLET BY MOUTH ONCE DAILY 10/12/17  Yes Minna Merritts, MD  b complex vitamins tablet Take 1 tablet by mouth daily. B12   Yes [provider]  diclofenac sodium (VOLTAREN) 1 % GEL Apply topically 4 (four) times daily.   Yes [provider]  FOLIC ACID PO Take 10 mg tablet twice a day.   Yes [provider]  gabapentin (NEURONTIN) 600 MG tablet TAKE 2 TABLETS  BY MOUTH AT BEDTIME 08/31/17  Yes [name], PA-C  guaiFENesin (MUCINEX) 600 MG 12 hr tablet Take 1,200 mg by mouth 2 (two) times daily.   Yes [provider]  ibuprofen (ADVIL,MOTRIN) 200 MG tablet Take 200 mg by mouth every 6 (six) hours as needed. For pain   Yes [provider]  meloxicam (MOBIC) 7.5 MG tablet Take 7.5 mg by mouth daily.   Yes [provider]  Multiple Vitamin (MULTIVITAMIN) tablet Take 1 tablet by mouth daily.     Yes [provider]  Nutritional Supplements (ESTROVEN ENERGY PO) Take by mouth daily.   Yes [provider]  omeprazole (PRILOSEC) 20 MG capsule Take 20 mg by mouth daily.   Yes [provider]  pravastatin (PRAVACHOL) 40 MG tablet Take 40 mg by mouth daily.   Yes [provider]  sertraline (ZOLOFT) 100 MG tablet TAKE 1 TABLET BY MOUTH ONCE DAILY 05/31/17  Yes [name], PA-C  traZODone (DESYREL) 50 MG tablet TAKE 1 TO 2 TABLETS BY MOUTH AT BEDTIME 05/31/17  Yes Burnette, Jennifer M, PA-C  valACYclovir (VALTREX) 1000 MG tablet TAKE TWO TABLETS BY MOUTH TWICE DAILY FOR 5 DAYS AS NEEDED 08/25/17  Yes Birdie Sons, MD  Venlafaxine HCl 75 MG TB24 TAKE 1 TABLET BY MOUTH THREE TIMES DAILY Patient taking differently: TAKE 3  TABLET BY MOUTH DAILY 05/04/17  Yes Birdie Sons, MD  amoxicillin-clavulanate (AUGMENTIN) 875-125 MG tablet Take 1 tablet by mouth 2 (two) times daily. Patient not taking: Reported on 09/07/2017 08/02/17   [name], PA-C  busPIRone (BUSPAR) 10 MG tablet Take 1 tablet (10 mg total) by mouth 2 (two) times daily. Patient not taking: Reported on 10/19/2017 09/22/17   [name], PA-C  cyclobenzaprine (FLEXERIL) 10 MG tablet Take 1 tablet (10 mg total) by mouth 3 (three) times daily as needed for muscle spasms. Patient not taking: Reported on 09/07/2017 07/07/17   [name], PA-C  fluocinonide ointment (LIDEX) 6.56 % Apply 1 application  topically 2 (two) times daily.    [provider]  methocarbamol (ROBAXIN) 500 MG tablet Take 500 mg by mouth 4 (four) times daily.    [provider]  Methotrexate, PF, 25 MG/0.5ML SOAJ 0.47mL once weekly 04/08/16   [name], PA-C  varenicline (CHANTIX CONTINUING MONTH PAK) 1 MG tablet Take 1 tablet (1 mg total) by mouth 2 (two) times daily. Patient not taking: Reported on 08/02/2017 07/28/17   Minna Merritts, MD  potassium chloride SA (K-DUR,KLOR-CON) 20 MEQ tablet Take 20 mEq by mouth daily.    11/11/11  [provider]    Allergies as of 09/03/2017 - Review Complete 08/02/2017  Allergen Reaction Noted  . Plaquenil [hydroxychloroquine] Rash 04/03/2015    Family History  Problem Relation Age of Onset  . Pancreatic cancer Father   . Alcohol abuse Father   . Alcohol abuse Mother   . Bipolar disorder Mother   . Suicidality Mother   . Anesthesia problems Neg Hx     Social History   Socioeconomic History  . Marital status: Married    Spouse name: Not on file  . Number of children: Not on file  . Years of education: Not on file  . Highest education level: Not on file  Social Needs  . Financial resource strain: Not on file  . Food insecurity - worry: Not on file  . Food insecurity - inability: Not on file  . Transportation needs - medical: Not on file  . Transportation needs - non-medical: Not on file  Occupational History  . Not on file  Tobacco Use  . Smoking status: Current Every Day Smoker    Packs/day: 0.50    Years: 40.00    Pack years: 20.00  . Smokeless tobacco: Never Used  Substance and Sexual Activity  . Alcohol use: No    Comment: recovering alcoholic Since 8127   . Drug use: No  . Sexual activity: No  Other Topics Concern  . Not on file  Social History Narrative   Married; full time; does not get regular exercise.     Review of Systems: See HPI, otherwise negative ROS  Physical Exam: BP 127/88   Pulse 72    Temp 97.6 F (36.4 C) (Tympanic)   Resp 14   Ht 5\' 9"  (1.753 m)   Wt 64 kg (141 lb)   SpO2 98%   BMI 20.82 kg/m  General:   Alert,  pleasant and cooperative in NAD Head:  Normocephalic and atraumatic. Neck:  Supple; no masses or thyromegaly. Lungs:  Clear throughout to auscultation.    Heart:  Regular rate and rhythm. Abdomen:  Soft, nontender and nondistended. Normal bowel sounds, without guarding, and without rebound.   Neurologic:  Alert and  oriented x4;  grossly normal neurologically.  Impression/Plan: Trannie Z  Susman is here for an colonoscopy to be performed for [facility] colon polyps.  Risks, benefits, limitations, and alternatives regarding  colonoscopy have been reviewed with the patient.  Questions have been answered.  All parties agreeable.   Gaylyn Cheers, MD  11/01/2017, 2:19 PM

## 2017-11-01 NOTE — Op Note (Signed)
[facility] Patient Name: Gabrielle Pineda Procedure Date: 11/01/2017 2:26 PM MRN: [id number] Account #: [id number] Date of Birth: [birth year] Admit Type: Outpatient Age: 62 Room: [facility] ENDO ROOM 1 Gender: Female Note Status: Finalized Procedure:            Colonoscopy Indications:          Screening for colorectal malignant neoplasm Providers:            Manya Silvas, MD Referring MD:         Kirstie Peri. Caryn Section, MD (Referring MD) Medicines:            Propofol per Anesthesia Complications:        No immediate complications. Procedure:            Pre-Anesthesia Assessment:                       - After reviewing the risks and benefits, the patient                        was deemed in satisfactory condition to undergo the                        procedure.                       After obtaining informed consent, the colonoscope was                        passed under direct vision. Throughout the procedure,                        the patient's blood pressure, pulse, and oxygen                        saturations were monitored continuously. The                        Colonoscope was introduced through the anus and                        advanced to the the cecum, identified by appendiceal                        orifice and ileocecal valve. The colonoscopy was                        performed without difficulty. The patient tolerated the                        procedure well. The quality of the bowel preparation                        was excellent. Findings:      A small polyp was found in the recto-sigmoid colon. The polyp was       sessile. The polyp was removed with a hot snare. Resection and retrieval       were complete.      Internal hemorrhoids were found during endoscopy. The hemorrhoids were       small and Grade I (internal hemorrhoids that do not prolapse).      The exam was  otherwise without abnormality. Impression:           - One  small polyp at the recto-sigmoid colon, removed                        with a hot snare. Resected and retrieved.                       - Internal hemorrhoids. Recommendation:       - Await pathology results. Manya Silvas, MD 11/01/2017 2:59:58 PM This report has been signed electronically. Number of Addenda: 0 Note Initiated On: 11/01/2017 2:26 PM Scope Withdrawal Time: 0 hours 12 minutes 19 seconds  Total Procedure Duration: 0 hours 23 minutes 28 seconds       [facility]

## 2017-11-01 NOTE — Anesthesia Preprocedure Evaluation (Signed)
Anesthesia Evaluation  Patient identified by MRN, date of birth, ID band Patient awake    Reviewed: Allergy & Precautions, NPO status , Patient's Chart, lab work & pertinent test results, reviewed documented beta blocker date and time   Airway Mallampati: II  TM Distance: >3 FB     Dental  (+) Chipped   Pulmonary shortness of breath, COPD, Current Smoker,           Cardiovascular hypertension, + dysrhythmias Supra Ventricular Tachycardia + Valvular Problems/Murmurs      Neuro/Psych PSYCHIATRIC DISORDERS Depression  Neuromuscular disease    GI/Hepatic GERD  ,  Endo/Other    Renal/GU      Musculoskeletal  (+) Arthritis , Rheumatoid disorders,    Abdominal   Peds  Hematology  (+) anemia ,   Anesthesia Other Findings ETOH.Bronchitis.  Reproductive/Obstetrics                             Anesthesia Physical Anesthesia Plan  ASA: III  Anesthesia Plan: General   Post-op Pain Management:    Induction: Intravenous  PONV Risk Score and Plan:   Airway Management Planned:   Additional Equipment:   Intra-op Plan:   Post-operative Plan:   Informed Consent: I have reviewed the patients History and Physical, chart, labs and discussed the procedure including the risks, benefits and alternatives for the proposed anesthesia with the patient or authorized representative who has indicated his/her understanding and acceptance.     Plan Discussed with: CRNA  Anesthesia Plan Comments:         Anesthesia Quick Evaluation

## 2017-11-01 NOTE — Anesthesia Postprocedure Evaluation (Signed)
Anesthesia Post Note  Patient: Gabrielle Pineda  Procedure(Pineda) Performed: COLONOSCOPY WITH PROPOFOL (N/A )  Patient location during evaluation: Endoscopy Anesthesia Type: General Level of consciousness: awake and alert Pain management: pain level controlled Vital Signs Assessment: post-procedure vital signs reviewed and stable Respiratory status: spontaneous breathing, nonlabored ventilation, respiratory function stable and patient connected to nasal cannula oxygen Cardiovascular status: blood pressure returned to baseline and stable Postop Assessment: no apparent nausea or vomiting Anesthetic complications: no     Last Vitals:  Vitals:   11/01/17 1520 11/01/17 1530  BP: (!) 148/81 137/77  Pulse:    Resp:    Temp:    SpO2:      Last Pain:  Vitals:   11/01/17 1500  TempSrc: Tympanic  PainSc:                  Gabrielle Pineda

## 2017-11-01 NOTE — Transfer of Care (Signed)
Immediate Anesthesia Transfer of Care Note  Patient: Gabrielle Pineda  Procedure(s) Performed: COLONOSCOPY WITH PROPOFOL (N/A )  Patient Location: PACU  Anesthesia Type:General  Level of Consciousness: awake and sedated  Airway & Oxygen Therapy: Patient Spontanous Breathing and Patient connected to nasal cannula oxygen  Post-op Assessment: Report given to RN and Post -op Vital signs reviewed and stable  Post vital signs: Reviewed and stable  Last Vitals:  Vitals:   11/01/17 1349  BP: 127/88  Pulse: 72  Resp: 14  Temp: 36.4 C  SpO2: 98%    Last Pain:  Vitals:   11/01/17 1349  TempSrc: Tympanic  PainSc: 6          Complications: No apparent anesthesia complications

## 2017-11-01 NOTE — Anesthesia Post-op Follow-up Note (Signed)
Anesthesia [facility] form completed.        

## 2017-11-02 ENCOUNTER — Encounter: Payer: Self-pay | Admitting: Unknown Physician Specialty

## 2017-11-03 LAB — SURGICAL PATHOLOGY

## 2017-11-04 ENCOUNTER — Encounter: Payer: Self-pay | Admitting: Family Medicine

## 2017-11-04 DIAGNOSIS — Z86010 Personal history of colonic polyps: Secondary | ICD-10-CM | POA: Insufficient documentation

## 2017-11-17 ENCOUNTER — Encounter: Payer: Self-pay | Admitting: Physician Assistant

## 2017-11-23 ENCOUNTER — Ambulatory Visit: Payer: Disability Insurance | Admitting: Student in an Organized Health Care Education/Training Program

## 2017-11-23 ENCOUNTER — Telehealth: Payer: Self-pay | Admitting: Cardiovascular Disease

## 2017-11-23 NOTE — Telephone Encounter (Signed)
Revied [facility] medical Records request via mail.  Fwd to [employer] via interoffice Mail.

## 2017-11-24 ENCOUNTER — Other Ambulatory Visit: Payer: Self-pay

## 2017-11-24 ENCOUNTER — Ambulatory Visit
Payer: BLUE CROSS/BLUE SHIELD | Attending: Student in an Organized Health Care Education/Training Program | Admitting: Student in an Organized Health Care Education/Training Program

## 2017-11-24 ENCOUNTER — Encounter: Payer: Self-pay | Admitting: Student in an Organized Health Care Education/Training Program

## 2017-11-24 VITALS — BP 143/88 | HR 76 | Temp 97.7°F | Resp 16 | Ht 69.0 in | Wt 144.0 lb

## 2017-11-24 DIAGNOSIS — J449 Chronic obstructive pulmonary disease, unspecified: Secondary | ICD-10-CM | POA: Diagnosis not present

## 2017-11-24 DIAGNOSIS — R079 Chest pain, unspecified: Secondary | ICD-10-CM | POA: Insufficient documentation

## 2017-11-24 DIAGNOSIS — E7800 Pure hypercholesterolemia, unspecified: Secondary | ICD-10-CM | POA: Diagnosis not present

## 2017-11-24 DIAGNOSIS — K219 Gastro-esophageal reflux disease without esophagitis: Secondary | ICD-10-CM | POA: Insufficient documentation

## 2017-11-24 DIAGNOSIS — Z800 Family history of malignant neoplasm of digestive organs: Secondary | ICD-10-CM | POA: Insufficient documentation

## 2017-11-24 DIAGNOSIS — G629 Polyneuropathy, unspecified: Secondary | ICD-10-CM | POA: Insufficient documentation

## 2017-11-24 DIAGNOSIS — G894 Chronic pain syndrome: Secondary | ICD-10-CM | POA: Diagnosis not present

## 2017-11-24 DIAGNOSIS — M5030 Other cervical disc degeneration, unspecified cervical region: Secondary | ICD-10-CM | POA: Diagnosis not present

## 2017-11-24 DIAGNOSIS — F17210 Nicotine dependence, cigarettes, uncomplicated: Secondary | ICD-10-CM | POA: Diagnosis not present

## 2017-11-24 DIAGNOSIS — M79641 Pain in right hand: Secondary | ICD-10-CM | POA: Diagnosis not present

## 2017-11-24 DIAGNOSIS — M79642 Pain in left hand: Secondary | ICD-10-CM | POA: Insufficient documentation

## 2017-11-24 DIAGNOSIS — F329 Major depressive disorder, single episode, unspecified: Secondary | ICD-10-CM | POA: Diagnosis not present

## 2017-11-24 DIAGNOSIS — M542 Cervicalgia: Secondary | ICD-10-CM | POA: Insufficient documentation

## 2017-11-24 DIAGNOSIS — K5900 Constipation, unspecified: Secondary | ICD-10-CM | POA: Insufficient documentation

## 2017-11-24 DIAGNOSIS — Q761 Klippel-Feil syndrome: Secondary | ICD-10-CM | POA: Insufficient documentation

## 2017-11-24 DIAGNOSIS — M4722 Other spondylosis with radiculopathy, cervical region: Secondary | ICD-10-CM | POA: Insufficient documentation

## 2017-11-24 DIAGNOSIS — M5010 Cervical disc disorder with radiculopathy, unspecified cervical region: Secondary | ICD-10-CM | POA: Diagnosis not present

## 2017-11-24 DIAGNOSIS — I471 Supraventricular tachycardia: Secondary | ICD-10-CM | POA: Insufficient documentation

## 2017-11-24 DIAGNOSIS — M47812 Spondylosis without myelopathy or radiculopathy, cervical region: Secondary | ICD-10-CM

## 2017-11-24 MED ORDER — ORPHENADRINE CITRATE 30 MG/ML IJ SOLN
60.0000 mg | Freq: Once | INTRAMUSCULAR | Status: AC
  Administered 2017-11-24: 60 mg via INTRAMUSCULAR
  Filled 2017-11-24: qty 2

## 2017-11-24 MED ORDER — KETOROLAC TROMETHAMINE 60 MG/2ML IM SOLN
60.0000 mg | Freq: Once | INTRAMUSCULAR | Status: AC
  Administered 2017-11-24: 60 mg via INTRAMUSCULAR
  Filled 2017-11-24: qty 2

## 2017-11-24 NOTE — Patient Instructions (Signed)
GENERAL RISKS AND COMPLICATIONS  What are the risk, side effects and possible complications? Generally speaking, most procedures are safe.  However, with any procedure there are risks, side effects, and the possibility of complications.  The risks and complications are dependent upon the sites that are lesioned, or the type of nerve block to be performed.  The closer the procedure is to the spine, the more serious the risks are.  Great care is taken when placing the radio frequency needles, block needles or lesioning probes, but sometimes complications can occur. 1. Infection: Any time there is an injection through the skin, there is a risk of infection.  This is why sterile conditions are used for these blocks.  There are four possible types of infection. 1. Localized skin infection. 2. Central Nervous System Infection-This can be in the form of Meningitis, which can be deadly. 3. Epidural Infections-This can be in the form of an epidural abscess, which can cause pressure inside of the spine, causing compression of the spinal cord with subsequent paralysis. This would require an emergency surgery to decompress, and there are no guarantees that the patient would recover from the paralysis. 4. Discitis-This is an infection of the intervertebral discs.  It occurs in about 1% of discography procedures.  It is difficult to treat and it may lead to surgery.        2. Pain: the needles have to go through skin and soft tissues, will cause soreness.       3. Damage to internal structures:  The nerves to be lesioned may be near blood vessels or    other nerves which can be potentially damaged.       4. Bleeding: Bleeding is more common if the patient is taking blood thinners such as  aspirin, Coumadin, Ticiid, Plavix, etc., or if he/she have some genetic predisposition  such as hemophilia. Bleeding into the spinal canal can cause compression of the spinal  cord with subsequent paralysis.  This would require an  emergency surgery to  decompress and there are no guarantees that the patient would recover from the  paralysis.       5. Pneumothorax:  Puncturing of a lung is a possibility, every time a needle is introduced in  the area of the chest or upper back.  Pneumothorax refers to free air around the  collapsed lung(s), inside of the thoracic cavity (chest cavity).  Another two possible  complications related to a similar event would include: Hemothorax and Chylothorax.   These are variations of the Pneumothorax, where instead of air around the collapsed  lung(s), you may have blood or chyle, respectively.       6. Spinal headaches: They may occur with any procedures in the area of the spine.       7. Persistent CSF (Cerebro-Spinal Fluid) leakage: This is a rare problem, but may occur  with prolonged intrathecal or epidural catheters either due to the formation of a fistulous  track or a dural tear.       8. Nerve damage: By working so close to the spinal cord, there is always a possibility of  nerve damage, which could be as serious as a permanent spinal cord injury with  paralysis.       9. Death:  Although rare, severe deadly allergic reactions known as "Anaphylactic  reaction" can occur to any of the medications used.      10. Worsening of the symptoms:  We can always make thing worse.    What are the chances of something like this happening? Chances of any of this occuring are extremely low.  By statistics, you have more of a chance of getting killed in a motor vehicle accident: while driving to the hospital than any of the above occurring .  Nevertheless, you should be aware that they are possibilities.  In general, it is similar to taking a shower.  Everybody knows that you can slip, hit your head and get killed.  Does that mean that you should not shower again?  Nevertheless always keep in mind that statistics do not mean anything if you happen to be on the wrong side of them.  Even if a procedure has a 1  (one) in a 1,000,000 (million) chance of going wrong, it you happen to be that one..Also, keep in mind that by statistics, you have more of a chance of having something go wrong when taking medications.  Who should not have this procedure? If you are on a blood thinning medication (e.g. Coumadin, Plavix, see list of "Blood Thinners"), or if you have an active infection going on, you should not have the procedure.  If you are taking any blood thinners, please inform your physician.  How should I prepare for this procedure?  Do not eat or drink anything at least six hours prior to the procedure.  Bring a driver with you .  It cannot be a taxi.  Come accompanied by an adult that can drive you back, and that is strong enough to help you if your legs get weak or numb from the local anesthetic.  Take all of your medicines the morning of the procedure with just enough water to swallow them.  If you have diabetes, make sure that you are scheduled to have your procedure done first thing in the morning, whenever possible.  If you have diabetes, take only half of your insulin dose and notify our nurse that you have done so as soon as you arrive at the clinic.  If you are diabetic, but only take blood sugar pills (oral hypoglycemic), then do not take them on the morning of your procedure.  You may take them after you have had the procedure.  Do not take aspirin or any aspirin-containing medications, at least eleven (11) days prior to the procedure.  They may prolong bleeding.  Wear loose fitting clothing that may be easy to take off and that you would not mind if it got stained with Betadine or blood.  Do not wear any jewelry or perfume  Remove any nail coloring.  It will interfere with some of our monitoring equipment.  NOTE: Remember that this is not meant to be interpreted as a complete list of all possible complications.  Unforeseen problems may occur.  BLOOD THINNERS The following drugs  contain aspirin or other products, which can cause increased bleeding during surgery and should not be taken for 2 weeks prior to and 1 week after surgery.  If you should need take something for relief of minor pain, you may take acetaminophen which is found in Tylenol,m Datril, Anacin-3 and Panadol. It is not blood thinner. The products listed below are.  Do not take any of the products listed below in addition to any listed on your instruction sheet.  A.P.C or A.P.C with Codeine Codeine Phosphate Capsules #3 Ibuprofen Ridaura  ABC compound Congesprin Imuran rimadil  Advil Cope Indocin Robaxisal  Alka-Seltzer Effervescent Pain Reliever and Antacid Coricidin or Coricidin-D  Indomethacin Rufen    Alka-Seltzer plus Cold Medicine Cosprin Ketoprofen S-A-C Tablets  Anacin Analgesic Tablets or Capsules Coumadin Korlgesic Salflex  Anacin Extra Strength Analgesic tablets or capsules CP-2 Tablets Lanoril Salicylate  Anaprox Cuprimine Capsules Levenox Salocol  Anexsia-D Dalteparin Magan Salsalate  Anodynos Darvon compound Magnesium Salicylate Sine-off  Ansaid Dasin Capsules Magsal Sodium Salicylate  Anturane Depen Capsules Marnal Soma  APF Arthritis pain formula Dewitt's Pills Measurin Stanback  Argesic Dia-Gesic Meclofenamic Sulfinpyrazone  Arthritis Bayer Timed Release Aspirin Diclofenac Meclomen Sulindac  Arthritis pain formula Anacin Dicumarol Medipren Supac  Analgesic (Safety coated) Arthralgen Diffunasal Mefanamic Suprofen  Arthritis Strength Bufferin Dihydrocodeine Mepro Compound Suprol  Arthropan liquid Dopirydamole Methcarbomol with Aspirin Synalgos  ASA tablets/Enseals Disalcid Micrainin Tagament  Ascriptin Doan's Midol Talwin  Ascriptin A/D Dolene Mobidin Tanderil  Ascriptin Extra Strength Dolobid Moblgesic Ticlid  Ascriptin with Codeine Doloprin or Doloprin with Codeine Momentum Tolectin  Asperbuf Duoprin Mono-gesic Trendar  Aspergum Duradyne Motrin or Motrin IB Triminicin  Aspirin  plain, buffered or enteric coated Durasal Myochrisine Trigesic  Aspirin Suppositories Easprin Nalfon Trillsate  Aspirin with Codeine Ecotrin Regular or Extra Strength Naprosyn Uracel  Atromid-S Efficin Naproxen Ursinus  Auranofin Capsules Elmiron Neocylate Vanquish  Axotal Emagrin Norgesic Verin  Azathioprine Empirin or Empirin with Codeine Normiflo Vitamin E  Azolid Emprazil Nuprin Voltaren  Bayer Aspirin plain, buffered or children's or timed BC Tablets or powders Encaprin Orgaran Warfarin Sodium  Buff-a-Comp Enoxaparin Orudis Zorpin  Buff-a-Comp with Codeine Equegesic Os-Cal-Gesic   Buffaprin Excedrin plain, buffered or Extra Strength Oxalid   Bufferin Arthritis Strength Feldene Oxphenbutazone   Bufferin plain or Extra Strength Feldene Capsules Oxycodone with Aspirin   Bufferin with Codeine Fenoprofen Fenoprofen Pabalate or Pabalate-SF   Buffets II Flogesic Panagesic   Buffinol plain or Extra Strength Florinal or Florinal with Codeine Panwarfarin   Buf-Tabs Flurbiprofen Penicillamine   Butalbital Compound Four-way cold tablets Penicillin   Butazolidin Fragmin Pepto-Bismol   Carbenicillin Geminisyn Percodan   Carna Arthritis Reliever Geopen Persantine   Carprofen Gold's salt Persistin   Chloramphenicol Goody's Phenylbutazone   Chloromycetin Haltrain Piroxlcam   Clmetidine heparin Plaquenil   Cllnoril Hyco-pap Ponstel   Clofibrate Hydroxy chloroquine Propoxyphen         Before stopping any of these medications, be sure to consult the physician who ordered them.  Some, such as Coumadin (Warfarin) are ordered to prevent or treat serious conditions such as "deep thrombosis", "pumonary embolisms", and other heart problems.  The amount of time that you may need off of the medication may also vary with the medication and the reason for which you were taking it.  If you are taking any of these medications, please make sure you notify your pain physician before you undergo any  procedures.         Facet Blocks Patient Information  Description: The facets are joints in the spine between the vertebrae.  Like any joints in the body, facets can become irritated and painful.  Arthritis can also effect the facets.  By injecting steroids and local anesthetic in and around these joints, we can temporarily block the nerve supply to them.  Steroids act directly on irritated nerves and tissues to reduce selling and inflammation which often leads to decreased pain.  Facet blocks may be done anywhere along the spine from the neck to the low back depending upon the location of your pain.   After numbing the skin with local anesthetic (like Novocaine), a small needle is passed onto the facet joints under x-ray guidance.    You may experience a sensation of pressure while this is being done.  The entire block usually lasts about 15-25 minutes.   Conditions which may be treated by facet blocks:   Low back/buttock pain  Neck/shoulder pain  Certain types of headaches  Preparation for the injection:  1. Do not eat any solid food or dairy products within 8 hours of your appointment. 2. You may drink clear liquid up to 3 hours before appointment.  Clear liquids include water, black coffee, juice or soda.  No milk or cream please. 3. You may take your regular medication, including pain medications, with a sip of water before your appointment.  Diabetics should hold regular insulin (if taken separately) and take 1/2 normal NPH dose the morning of the procedure.  Carry some sugar containing items with you to your appointment. 4. A driver must accompany you and be prepared to drive you home after your procedure. 5. Bring all your current medications with you. 6. An IV may be inserted and sedation may be given at the discretion of the physician. 7. A blood pressure cuff, EKG and other monitors will often be applied during the procedure.  Some patients may need to have extra oxygen  administered for a short period. 8. You will be asked to provide medical information, including your allergies and medications, prior to the procedure.  We must know immediately if you are taking blood thinners (like Coumadin/Warfarin) or if you are allergic to IV iodine contrast (dye).  We must know if you could possible be pregnant.  Possible side-effects:   Bleeding from needle site  Infection (rare, may require surgery)  Nerve injury (rare)  Numbness & tingling (temporary)  Difficulty urinating (rare, temporary)  Spinal headache (a headache worse with upright posture)  Light-headedness (temporary)  Pain at injection site (serveral days)  Decreased blood pressure (rare, temporary)  Weakness in arm/leg (temporary)  Pressure sensation in back/neck (temporary)   Call if you experience:   Fever/chills associated with headache or increased back/neck pain  Headache worsened by an upright position  New onset, weakness or numbness of an extremity below the injection site  Hives or difficulty breathing (go to the emergency room)  Inflammation or drainage at the injection site(s)  Severe back/neck pain greater than usual  New symptoms which are concerning to you  Please note:  Although the local anesthetic injected can often make your back or neck feel good for several hours after the injection, the pain will likely return. It takes 3-7 days for steroids to work.  You may not notice any pain relief for at least one week.  If effective, we will often do a series of 2-3 injections spaced 3-6 weeks apart to maximally decrease your pain.  After the initial series, you may be a candidate for a more permanent nerve block of the facets.  If you have any questions, please call [phone] [facility] 

## 2017-11-24 NOTE — Progress Notes (Signed)
Patient's Name: Gabrielle Pineda  MRN: [id number]  Referring Provider: Birdie Sons, MD  DOB: [birth year]  PCP: Birdie Sons, MD  DOS: 11/24/2017  Note by: [name], MD  Service setting: Ambulatory outpatient  Specialty: Interventional Pain Management  Location: [facility] (AMB) Pain Management Facility    Patient type: Established   Primary Reason(s) for Visit: Encounter for post-procedure evaluation of chronic illness with mild to moderate exacerbation CC: Back Pain and Neck Pain  HPI  Gabrielle Pineda is a 62 y.o. year old, female patient, who comes today for a post-procedure evaluation. She has Hyperlipidemia; SMOKER; Paroxysmal supraventricular tachycardia ([place]); CHEST PAIN UNSPECIFIED; ELECTROCARDIOGRAM, ABNORMAL; Shortness of breath; Closed fracture of distal phalanx of thumb; Acid reflux; Arthritis, degenerative; Arthritis or polyarthritis, rheumatoid ([place]); B-complex deficiency; CN (constipation); Clinical depression; Family history of malignant neoplasm of pancreas; Cold sore; H/O alcohol abuse; Hypercholesteremia; Disorder of iron metabolism; Cannot sleep; Localized osteoarthrosis, hand; Menopausal symptom; Weight loss; Delayed onset of urination; COPD (chronic obstructive pulmonary disease) ([place]); Closed compression fracture of L5 lumbar vertebra; Sacral insufficiency fracture with routine healing; Essential hypertension; Cervical radiculopathy; Neuropathy; and History of adenomatous polyp of colon on their problem list. Her primarily concern today is the Back Pain and Neck Pain  Pain Assessment: Location: Lower Back(neck) Radiating: back pain radiates up to neck.  Onset: More than a month ago Duration: Chronic pain Quality: Aching, Constant Severity: 10-Worst pain ever/10 (self-reported pain score)  Note: Reported level is inconsistent with clinical observations.                         When using our objective Pain Scale, levels between 6 and 10/10 are said to belong in an  emergency room, as it progressively worsens from a 6/10, described as severely limiting, requiring emergency care not usually available at an outpatient pain management facility. At a 6/10 level, communication becomes difficult and requires great effort. Assistance to reach the emergency department may be required. Facial flushing and profuse sweating along with potentially dangerous increases in heart rate and blood pressure will be evident. Effect on ADL:   Timing: Constant Modifying factors: nothing  Gabrielle Pineda comes in today for post-procedure evaluation after the treatment done on 10/27/2017.  Further details on both, my assessment(s), as well as the proposed treatment plan, please see below.  Post-Procedure Assessment  10/27/2017 Procedure: Right C7-T1 ESI Pre-procedure pain score:  8/10 Post-procedure pain score: 0/10         Influential Factors: BMI: 21.27 kg/m Intra-procedural challenges: None observed.         Assessment challenges: None detected.              Reported side-effects: None.        Post-procedural adverse reactions or complications: None reported         Sedation: Please see nurses note. When no sedatives are used, the analgesic levels obtained are directly associated to the effectiveness of the local anesthetics. However, when sedation is provided, the level of analgesia obtained during the initial 1 hour following the intervention, is believed to be the result of a combination of factors. These factors may include, but are not limited to: 1. The effectiveness of the local anesthetics used. 2. The effects of the analgesic(s) and/or anxiolytic(s) used. 3. The degree of discomfort experienced by the patient at the time of the procedure. 4. The patients ability and reliability in recalling and recording the events. 5. The presence and influence  of possible secondary gains and/or psychosocial factors. Reported result: Relief experienced during the 1st hour after the  procedure: 100 % (Ultra-Short Term Relief)            Interpretative annotation: Clinically appropriate result. Analgesia during this period is likely to be Local Anesthetic and/or IV Sedative (Analgesic/Anxiolytic) related.          Effects of local anesthetic: The analgesic effects attained during this period are directly associated to the localized infiltration of local anesthetics and therefore cary significant diagnostic value as to the etiological location, or anatomical origin, of the pain. Expected duration of relief is directly dependent on the pharmacodynamics of the local anesthetic used. Long-acting (4-6 hours) anesthetics used.  Reported result: Relief during the next 4 to 6 hour after the procedure: 100 % (Short-Term Relief)            Interpretative annotation: Clinically appropriate result. Analgesia during this period is likely to be Local Anesthetic-related.          Long-term benefit: Defined as the period of time past the expected duration of local anesthetics (1 hour for short-acting and 4-6 hours for long-acting). With the possible exception of prolonged sympathetic blockade from the local anesthetics, benefits during this period are typically attributed to, or associated with, other factors such as analgesic sensory neuropraxia, antiinflammatory effects, or beneficial biochemical changes provided by agents other than the local anesthetics.  Reported result: Extended relief following procedure: 0 % (Long-Term Relief)            Interpretative annotation: Unexpected result. No long-term benefit. Therapeutic failure. Pain appears to be refractory to this treatment modality.          Current benefits: Defined as reported results that persistent at this point in time.   Analgesia: 0 %            Function: No benefit  ROM: No benefit Interpretative annotation: Recurrence of symptoms. Therapeutic failure. Results would argue against repeating therapy.          Interpretation: Results  would suggest failure of therapy in achieving desired goal(s).                  Plan:  Please see "Plan of Care" for details.                Laboratory Chemistry  Inflammation Markers (CRP: Acute Phase) (ESR: Chronic Phase) No results found for: CRP, ESRSEDRATE, LATICACIDVEN                       Rheumatology Markers No results found for: RF, ANA, Therisa Doy[place], [facility]                      Renal Function Markers Lab Results  Component Value Date   BUN 10 04/16/2017   CREATININE 0.73 04/16/2017   GFRAA 104 04/16/2017   GFRNONAA 90 04/16/2017                              Hepatic Function Markers Lab Results  Component Value Date   AST 18 04/16/2017   ALT 12 04/16/2017   ALBUMIN 4.3 04/16/2017   ALKPHOS 99 04/16/2017                        Electrolytes Lab Results  Component Value Date   NA 144 04/16/2017  K 3.8 04/16/2017   CL 110 (H) 04/16/2017   CALCIUM 9.3 04/16/2017                        Neuropathy Markers Lab Results  Component Value Date   HGBA1C 5.2 04/16/2017                        Bone Pathology Markers No results found for: [place], [id number], [id number], IW8032ZY2, 25OHVITD1, 25OHVITD2, 25OHVITD3, TESTOFREE, TESTOSTERONE                       Coagulation Parameters Lab Results  Component Value Date   PLT 297 04/16/2017                        Cardiovascular Markers Lab Results  Component Value Date   HGB 12.0 04/16/2017   HCT 35.1 04/16/2017                         CA Markers No results found for: CEA, CA125, LABCA2                      Note: Lab results reviewed.   Meds   Current Outpatient Medications:  .  atorvastatin (LIPITOR) 40 MG tablet, TAKE 1 TABLET BY MOUTH ONCE DAILY, Disp: 90 tablet, Rfl: 3 .  b complex vitamins tablet, Take 1 tablet by mouth daily. B12, Disp: , Rfl:  .  FOLIC ACID PO, Take 10 mg tablet twice a day., Disp: , Rfl:  .  ibuprofen (ADVIL,MOTRIN) 200 MG tablet, Take 200 mg by mouth every 6  (six) hours as needed. For pain, Disp: , Rfl:  .  Multiple Vitamin (MULTIVITAMIN) tablet, Take 1 tablet by mouth daily.  , Disp: , Rfl:  .  omeprazole (PRILOSEC) 20 MG capsule, Take 20 mg by mouth daily., Disp: , Rfl:  .  sertraline (ZOLOFT) 100 MG tablet, TAKE 1 TABLET BY MOUTH ONCE DAILY, Disp: 90 tablet, Rfl: 1 .  traZODone (DESYREL) 50 MG tablet, TAKE 1 TO 2 TABLETS BY MOUTH AT BEDTIME, Disp: 180 tablet, Rfl: 1 .  valACYclovir (VALTREX) 1000 MG tablet, TAKE TWO TABLETS BY MOUTH TWICE DAILY FOR 5 DAYS AS NEEDED, Disp: 40 tablet, Rfl: 3 .  Venlafaxine HCl 75 MG TB24, TAKE 1 TABLET BY MOUTH THREE TIMES DAILY (Patient taking differently: TAKE 3 TABLET BY MOUTH DAILY), Disp: 270 tablet, Rfl: 3 .  diclofenac sodium (VOLTAREN) 1 % GEL, Apply topically 4 (four) times daily., Disp: , Rfl:  .  fluocinonide ointment (LIDEX) 4.82 %, Apply 1 application topically 2 (two) times daily., Disp: , Rfl:  .  guaiFENesin (MUCINEX) 600 MG 12 hr tablet, Take 1,200 mg by mouth 2 (two) times daily., Disp: , Rfl:  .  meloxicam (MOBIC) 7.5 MG tablet, Take 7.5 mg by mouth daily., Disp: , Rfl:  .  methocarbamol (ROBAXIN) 500 MG tablet, Take 500 mg by mouth 4 (four) times daily., Disp: , Rfl:  .  Methotrexate, PF, 25 MG/0.5ML SOAJ, 0.66m once weekly, Disp: 0.5 mL, Rfl: 11 .  Nutritional Supplements (ESTROVEN ENERGY PO), Take by mouth daily., Disp: , Rfl:  .  pravastatin (PRAVACHOL) 40 MG tablet, Take 40 mg by mouth daily., Disp: , Rfl:  .  varenicline (CHANTIX CONTINUING MONTH PAK) 1 MG tablet, Take 1 tablet (1 mg total) by mouth 2 (two)  times daily. (Patient not taking: Reported on 08/02/2017), Disp: 60 tablet, Rfl: 4  Current Facility-Administered Medications:  .  ipratropium-albuterol (DUONEB) 0.5-2.5 (3) MG/3ML nebulizer solution 3 mL, 3 mL, Nebulization, Once, Pollak, Adriana M, PA-C  ROS  Constitutional: Denies any fever or chills Gastrointestinal: No reported hemesis, hematochezia, vomiting, or acute GI  distress Musculoskeletal: Denies any acute onset joint swelling, redness, loss of ROM, or weakness Neurological: No reported episodes of acute onset apraxia, aphasia, dysarthria, agnosia, amnesia, paralysis, loss of coordination, or loss of consciousness  Allergies  Gabrielle Pineda is allergic to plaquenil [hydroxychloroquine].  [place]  Drug: Gabrielle Pineda  reports that she does not use drugs. Alcohol:  reports that she does not drink alcohol. Tobacco:  reports that she has been smoking.  She has a 20.00 pack-year smoking history. She has never used smokeless tobacco. Medical:  has a past medical history of Alcohol abuse, Anemia, Arthritis, Cervicalgia, COPD (chronic obstructive pulmonary disease) ([place]), Depression, GERD (gastroesophageal reflux disease), Heart murmur, Other and unspecified hyperlipidemia, and Tachycardia. Surgical: Gabrielle Pineda  has a past surgical history that includes neck disc surgery; Breast enhancement surgery (1981); Esophagogastroduodenoscopy; Tonsillectomy and adenoidectomy (1973 ); Carpal tunnel release (11/11/2011); Carpal tunnel release (2013); Rotator cuff repair (06/16/2016); Rotator cuff repair; and Colonoscopy with propofol (N/A, 11/01/2017). Family: family history includes Alcohol abuse in her father and mother; Bipolar disorder in her mother; Pancreatic cancer in her father; Suicidality in her mother.  Constitutional Exam  General appearance: Well nourished, well developed, and well hydrated. In no apparent acute distress Vitals:   11/24/17 1257 11/24/17 1258  BP:  (!) 143/88  Pulse:  76  Resp:  16  Temp:  97.7 F (36.5 C)  SpO2:  98%  Weight: 144 lb (65.3 kg)   Height: _0  (1.753 m)    BMI Assessment: Estimated body mass index is 21.27 kg/m as calculated from the following:   Height as of this encounter: _1  (1.753 m).   Weight as of this encounter: 144 lb (65.3 kg).  BMI interpretation table: BMI level Category Range association with higher  incidence of chronic pain  <18 kg/m2 Underweight   18.5-24.9 kg/m2 Ideal body weight   25-29.9 kg/m2 Overweight Increased incidence by 20%  30-34.9 kg/m2 Obese (Class I) Increased incidence by 68%  35-39.9 kg/m2 Severe obesity (Class II) Increased incidence by 136%  >40 kg/m2 Extreme obesity (Class III) Increased incidence by 254%   BMI Readings from Last 4 Encounters:  11/24/17 21.27 kg/m  11/01/17 20.82 kg/m  10/27/17 21.41 kg/m  10/19/17 21.41 kg/m   Wt Readings from Last 4 Encounters:  11/24/17 144 lb (65.3 kg)  11/01/17 141 lb (64 kg)  10/27/17 145 lb (65.8 kg)  10/19/17 145 lb (65.8 kg)  Psych/Mental status: Alert, oriented x 3 (person, place, & time)       Eyes: PERLA Respiratory: No evidence of acute respiratory distress  Cervical Spine Area Exam  Skin & Axial Inspection: Well healed scar from previous spine surgery detected Alignment: Asymmetric Functional ROM: Decreased ROM, to the right Stability: No instability detected Muscle Tone/Strength: Functionally intact. No obvious neuro-muscular anomalies detected. Sensory (Neurological): Musculoskeletal pain pattern on the right Palpation: Complains of area being tender to palpation Positive provocative maneuver for for cervical facet disease on the right  Upper Extremity (UE) Exam    Side: Right upper extremity  Side: Left upper extremity  Skin & Extremity Inspection: Skin color, temperature, and hair growth are WNL. No peripheral edema or cyanosis.  No masses, redness, swelling, asymmetry, or associated skin lesions. No contractures.  Skin & Extremity Inspection: Skin color, temperature, and hair growth are WNL. No peripheral edema or cyanosis. No masses, redness, swelling, asymmetry, or associated skin lesions. No contractures.  Functional ROM: Unrestricted ROM          Functional ROM: Unrestricted ROM          Muscle Tone/Strength: Functionally intact. No obvious neuro-muscular anomalies detected.  Muscle  Tone/Strength: Functionally intact. No obvious neuro-muscular anomalies detected.  Sensory (Neurological): Unimpaired          Sensory (Neurological): Unimpaired          Palpation: No palpable anomalies              Palpation: No palpable anomalies              Specialized Test(s): Deferred         Specialized Test(s): Deferred          Thoracic Spine Area Exam  Skin & Axial Inspection: No masses, redness, or swelling Alignment: Symmetrical Functional ROM: Unrestricted ROM Stability: No instability detected Muscle Tone/Strength: Functionally intact. No obvious neuro-muscular anomalies detected. Sensory (Neurological): Unimpaired Muscle strength & Tone: No palpable anomalies  Lumbar Spine Area Exam  Skin & Axial Inspection: No masses, redness, or swelling Alignment: Symmetrical Functional ROM: Unrestricted ROM      Stability: No instability detected Muscle Tone/Strength: Functionally intact. No obvious neuro-muscular anomalies detected. Sensory (Neurological): Unimpaired Palpation: No palpable anomalies       Provocative Tests: Lumbar Hyperextension and rotation test: evaluation deferred today       Lumbar Lateral bending test: evaluation deferred today       Patrick's Maneuver: evaluation deferred today                    Gait & Posture Assessment  Ambulation: Unassisted Gait: Relatively normal for age and body habitus Posture: WNL   Lower Extremity Exam    Side: Right lower extremity  Side: Left lower extremity  Skin & Extremity Inspection: Skin color, temperature, and hair growth are WNL. No peripheral edema or cyanosis. No masses, redness, swelling, asymmetry, or associated skin lesions. No contractures.  Skin & Extremity Inspection: Skin color, temperature, and hair growth are WNL. No peripheral edema or cyanosis. No masses, redness, swelling, asymmetry, or associated skin lesions. No contractures.  Functional ROM: Unrestricted ROM          Functional ROM: Unrestricted ROM           Muscle Tone/Strength: Functionally intact. No obvious neuro-muscular anomalies detected.  Muscle Tone/Strength: Functionally intact. No obvious neuro-muscular anomalies detected.  Sensory (Neurological): Unimpaired  Sensory (Neurological): Unimpaired  Palpation: No palpable anomalies  Palpation: No palpable anomalies   Assessment  Primary Diagnosis & Pertinent Problem List: The primary encounter diagnosis was Spondylosis of cervical region without myelopathy or radiculopathy. Diagnoses of DDD (degenerative disc disease), cervical, Cervical fusion syndrome, Chronic pain syndrome, and Cervicalgia were also pertinent to this visit.  Status Diagnosis  Persistent Persistent Persistent 1. Spondylosis of cervical region without myelopathy or radiculopathy   2. DDD (degenerative disc disease), cervical   3. Cervical fusion syndrome   4. Chronic pain syndrome   5. Cervicalgia      General Recommendations: The pain condition that the patient suffers from is best treated with a multidisciplinary approach that involves an increase in physical activity to prevent de-conditioning and worsening of the pain cycle, as well as psychological  counseling (formal and/or informal) to address the co-morbid psychological affects of pain. Treatment will often involve judicious use of pain medications and interventional procedures to decrease the pain, allowing the patient to participate in the physical activity that will ultimately produce long-lasting pain reductions. The goal of the multidisciplinary approach is to return the patient to a higher level of overall function and to restore their ability to perform activities of daily living.  62 year old female with a primary complaint of neck pain that radiates down into bilateral hands. Patient's past medical history significant for rheumatoid arthritis,cervical radiculopathy and fusion with extensive cervical spine surgery hx as follow:2012 C3-4 fusion, 2014  C4-6 fusion and 2017 C6-T1 fusion.Patient recently went to the [facility] in [facility] for a comprehensive cervical spine evaluation. There she met with neurosurgery, physical medicine, pain medicine to help with her symptoms. At that time she received right C7-T1 ESI at the [facility] in [facility] which was effective for her upper extremity radicular symptoms. Patient is also had an EMG completed which shows length dependent peripheral neuropathy. Patient also has carpal tunnel syndrome status post carpal tunnel surgery 2015. Patient has previously tried Flexeril, ibuprofen, meloxicam which are only somewhat helpful. She takes Zoloft 100 mg daily. Patient also takes gabapentin 1200 mg nightly.   Patient returns today for follow-up status post right C7-T1 epidural steroid injection.  Unfortunately the patient did not obtain any significant pain relief from her epidural steroid injection which is somewhat unexpected given that she did have benefits with her previous right C7-T1 ESI done at the [facility] in [place].  Patient is endorsing severe right shoulder and neck pain and associated disability. Gabrielle Pineda has a history of greater than 3 months of moderate to severe pain which is resulted in functional impairment.  The patient has tried various conservative therapeutic options such as NSAIDs, Tylenol, muscle relaxants, physical therapy which was inadequately effective.  Patient's pain is predominantly axial with physical exam findings suggestive of facet arthropathy.We discussed cervical facet medial branch nerve blocks, diagnostic at C4, 5, 6, 7 on the right for cervical spondylosis and facet arthropathy.  Risks and benefits of right C4, C5, C6, C7 cervical facet medial branch nerve block were discussed and patient would like to proceed.  Given that the patient is having an acute on chronic pain exacerbation, also administer intramuscular Norflex and Toradol  60 mg respectively.  Plan: -Right C4, 5, 6, 7 cervical facet medial branch nerve block for cervical spondylosis and facet arthropathy #1 -Intramuscular Norflex and Toradol 60 mg respectively -Continue gabapentin and Effexor as prescribed.  Plan of Care  Pharmacotherapy (Medications Ordered): Meds ordered this encounter  Medications  . orphenadrine (NORFLEX) injection 60 mg  . ketorolac (TORADOL) injection 60 mg   Lab-work, procedure(s), and/or referral(s): Orders Placed This Encounter  Procedures  . CERVICAL FACET (MEDIAL BRANCH NERVE BLOCK)     Previous interventions Cervical epidural steroid injection (right C7-T1), 10/27/2017: Not effective  Provider-requested follow-up: Return in about 2 weeks (around 12/08/2017) for Procedure. Time Note: Greater than 50% of the 25 minute(s) of face-to-face time spent with Gabrielle Pineda, was spent in counseling/coordination of care regarding: Gabrielle Pineda primary cause of pain, the treatment plan, treatment alternatives, the risks and possible complications of proposed treatment, going over the informed consent, the results, interpretation and significance of  her recent diagnostic interventional treatment(s) and realistic expectations. Future Appointments  Date Time Provider [place]  12/15/2017  9:00 AM [name], MD  Learned None    Primary Care Physician: Birdie Sons, MD Location: [facility] Out[facility] Management Facility Note by: [name], M.D Date: 11/24/2017; Time: 2:57 PM  Patient Instructions   GENERAL RISKS AND COMPLICATIONS  What are the risk, side effects and possible complications? Generally speaking, most procedures are safe.  However, with any procedure there are risks, side effects, and the possibility of complications.  The risks and complications are dependent upon the sites that are lesioned, or the type of nerve block to be performed.  The closer the procedure is to the spine, the more serious the  risks are.  Great care is taken when placing the radio frequency needles, block needles or lesioning probes, but sometimes complications can occur. 1. Infection: Any time there is an injection through the skin, there is a risk of infection.  This is why sterile conditions are used for these blocks.  There are four possible types of infection. 1. Localized skin infection. 2. Central Nervous System Infection-This can be in the form of Meningitis, which can be deadly. 3. Epidural Infections-This can be in the form of an epidural abscess, which can cause pressure inside of the spine, causing compression of the spinal cord with subsequent paralysis. This would require an emergency surgery to decompress, and there are no guarantees that the patient would recover from the paralysis. 4. Discitis-This is an infection of the intervertebral discs.  It occurs in about 1% of discography procedures.  It is difficult to treat and it may lead to surgery.        2. Pain: the needles have to go through skin and soft tissues, will cause soreness.       3. Damage to internal structures:  The nerves to be lesioned may be near blood vessels or    other nerves which can be potentially damaged.       4. Bleeding: Bleeding is more common if the patient is taking blood thinners such as  aspirin, Coumadin, Ticiid, Plavix, etc., or if he/she have some genetic predisposition  such as hemophilia. Bleeding into the spinal canal can cause compression of the spinal  cord with subsequent paralysis.  This would require an emergency surgery to  decompress and there are no guarantees that the patient would recover from the  paralysis.       5. Pneumothorax:  Puncturing of a lung is a possibility, every time a needle is introduced in  the area of the chest or upper back.  Pneumothorax refers to free air around the  collapsed lung(s), inside of the thoracic cavity (chest cavity).  Another two possible  complications related to a similar event  would include: Hemothorax and Chylothorax.   These are variations of the Pneumothorax, where instead of air around the collapsed  lung(s), you may have blood or chyle, respectively.       6. Spinal headaches: They may occur with any procedures in the area of the spine.       7. Persistent CSF (Cerebro-Spinal Fluid) leakage: This is a rare problem, but may occur  with prolonged intrathecal or epidural catheters either due to the formation of a fistulous  track or a dural tear.       8. Nerve damage: By working so close to the spinal cord, there is always a possibility of  nerve damage, which could be as serious as a permanent spinal cord injury with  paralysis.       9. Death:  Although rare,  severe deadly allergic reactions known as "Anaphylactic  reaction" can occur to any of the medications used.      10. Worsening of the symptoms:  We can always make thing worse.  What are the chances of something like this happening? Chances of any of this occuring are extremely low.  By statistics, you have more of a chance of getting killed in a motor vehicle accident: while driving to the hospital than any of the above occurring .  Nevertheless, you should be aware that they are possibilities.  In general, it is similar to taking a shower.  Everybody knows that you can slip, hit your head and get killed.  Does that mean that you should not shower again?  Nevertheless always keep in mind that statistics do not mean anything if you happen to be on the wrong side of them.  Even if a procedure has a 1 (one) in a 1,000,000 (million) chance of going wrong, it you happen to be that one..Also, keep in mind that by statistics, you have more of a chance of having something go wrong when taking medications.  Who should not have this procedure? If you are on a blood thinning medication (e.g. Coumadin, Plavix, see list of "Blood Thinners"), or if you have an active infection going on, you should not have the procedure.  If you  are taking any blood thinners, please inform your physician.  How should I prepare for this procedure?  Do not eat or drink anything at least six hours prior to the procedure.  Bring a driver with you .  It cannot be a taxi.  Come accompanied by an adult that can drive you back, and that is strong enough to help you if your legs get weak or numb from the local anesthetic.  Take all of your medicines the morning of the procedure with just enough water to swallow them.  If you have diabetes, make sure that you are scheduled to have your procedure done first thing in the morning, whenever possible.  If you have diabetes, take only half of your insulin dose and notify our nurse that you have done so as soon as you arrive at the clinic.  If you are diabetic, but only take blood sugar pills (oral hypoglycemic), then do not take them on the morning of your procedure.  You may take them after you have had the procedure.  Do not take aspirin or any aspirin-containing medications, at least eleven (11) days prior to the procedure.  They may prolong bleeding.  Wear loose fitting clothing that may be easy to take off and that you would not mind if it got stained with Betadine or blood.  Do not wear any jewelry or perfume  Remove any nail coloring.  It will interfere with some of our monitoring equipment.  NOTE: Remember that this is not meant to be interpreted as a complete list of all possible complications.  Unforeseen problems may occur.  BLOOD THINNERS The following drugs contain aspirin or other products, which can cause increased bleeding during surgery and should not be taken for 2 weeks prior to and 1 week after surgery.  If you should need take something for relief of minor pain, you may take acetaminophen which is found in Tylenol,m Datril, Anacin-3 and Panadol. It is not blood thinner. The products listed below are.  Do not take any of the products listed below in addition to any listed on  your instruction sheet.  A.P.C  or A.P.C with Codeine Codeine Phosphate Capsules #3 Ibuprofen Ridaura  ABC compound Congesprin Imuran rimadil  Advil Cope Indocin Robaxisal  Alka-Seltzer Effervescent Pain Reliever and Antacid Coricidin or Coricidin-D  Indomethacin Rufen  Alka-Seltzer plus Cold Medicine Cosprin Ketoprofen S-A-C Tablets  Anacin Analgesic Tablets or Capsules Coumadin Korlgesic Salflex  Anacin Extra Strength Analgesic tablets or capsules CP-2 Tablets Lanoril Salicylate  Anaprox Cuprimine Capsules Levenox Salocol  Anexsia-D Dalteparin Magan Salsalate  Anodynos Darvon compound Magnesium Salicylate Sine-off  Ansaid Dasin Capsules Magsal Sodium Salicylate  Anturane Depen Capsules Marnal Soma  APF Arthritis pain formula Dewitt's Pills Measurin Stanback  Argesic Dia-Gesic Meclofenamic Sulfinpyrazone  Arthritis Bayer Timed Release Aspirin Diclofenac Meclomen Sulindac  Arthritis pain formula Anacin Dicumarol Medipren Supac  Analgesic (Safety coated) Arthralgen Diffunasal Mefanamic Suprofen  Arthritis Strength Bufferin Dihydrocodeine Mepro Compound Suprol  Arthropan liquid Dopirydamole Methcarbomol with Aspirin Synalgos  ASA tablets/Enseals Disalcid Micrainin Tagament  Ascriptin Doan's Midol Talwin  Ascriptin A/D Dolene Mobidin Tanderil  Ascriptin Extra Strength Dolobid Moblgesic Ticlid  Ascriptin with Codeine Doloprin or Doloprin with Codeine Momentum Tolectin  Asperbuf Duoprin Mono-gesic Trendar  Aspergum Duradyne Motrin or Motrin IB Triminicin  Aspirin plain, buffered or enteric coated Durasal Myochrisine Trigesic  Aspirin Suppositories Easprin Nalfon Trillsate  Aspirin with Codeine Ecotrin Regular or Extra Strength Naprosyn Uracel  Atromid-S Efficin Naproxen Ursinus  Auranofin Capsules Elmiron Neocylate Vanquish  Axotal Emagrin Norgesic Verin  Azathioprine Empirin or Empirin with Codeine Normiflo Vitamin E  Azolid Emprazil Nuprin Voltaren  Bayer Aspirin plain, buffered or  children's or timed BC Tablets or powders Encaprin Orgaran Warfarin Sodium  Buff-a-Comp Enoxaparin Orudis Zorpin  Buff-a-Comp with Codeine Equegesic Os-Cal-Gesic   Buffaprin Excedrin plain, buffered or Extra Strength Oxalid   Bufferin Arthritis Strength Feldene Oxphenbutazone   Bufferin plain or Extra Strength Feldene Capsules Oxycodone with Aspirin   Bufferin with Codeine Fenoprofen Fenoprofen Pabalate or Pabalate-SF   Buffets II Flogesic Panagesic   Buffinol plain or Extra Strength Florinal or Florinal with Codeine Panwarfarin   Buf-Tabs Flurbiprofen Penicillamine   Butalbital Compound Four-way cold tablets Penicillin   Butazolidin Fragmin Pepto-Bismol   Carbenicillin Geminisyn Percodan   Carna Arthritis Reliever Geopen Persantine   Carprofen Gold's salt Persistin   Chloramphenicol Goody's Phenylbutazone   Chloromycetin Haltrain Piroxlcam   Clmetidine heparin Plaquenil   Cllnoril Hyco-pap Ponstel   Clofibrate Hydroxy chloroquine Propoxyphen         Before stopping any of these medications, be sure to consult the physician who ordered them.  Some, such as Coumadin (Warfarin) are ordered to prevent or treat serious conditions such as "deep thrombosis", "pumonary embolisms", and other heart problems.  The amount of time that you may need off of the medication may also vary with the medication and the reason for which you were taking it.  If you are taking any of these medications, please make sure you notify your pain physician before you undergo any procedures.         Facet Blocks Patient Information  Description: The facets are joints in the spine between the vertebrae.  Like any joints in the body, facets can become irritated and painful.  Arthritis can also effect the facets.  By injecting steroids and local anesthetic in and around these joints, we can temporarily block the nerve supply to them.  Steroids act directly on irritated nerves and tissues to reduce selling and  inflammation which often leads to decreased pain.  Facet blocks may be done anywhere along the spine from the  neck to the low back depending upon the location of your pain.   After numbing the skin with local anesthetic (like Novocaine), a small needle is passed onto the facet joints under x-ray guidance.  You may experience a sensation of pressure while this is being done.  The entire block usually lasts about 15-25 minutes.   Conditions which may be treated by facet blocks:   Low back/buttock pain  Neck/shoulder pain  Certain types of headaches  Preparation for the injection:  1. Do not eat any solid food or dairy products within 8 hours of your appointment. 2. You may drink clear liquid up to 3 hours before appointment.  Clear liquids include water, black coffee, juice or soda.  No milk or cream please. 3. You may take your regular medication, including pain medications, with a sip of water before your appointment.  Diabetics should hold regular insulin (if taken separately) and take 1/2 normal NPH dose the morning of the procedure.  Carry some sugar containing items with you to your appointment. 4. A driver must accompany you and be prepared to drive you home after your procedure. 5. Bring all your current medications with you. 6. An IV may be inserted and sedation may be given at the discretion of the physician. 7. A blood pressure cuff, EKG and other monitors will often be applied during the procedure.  Some patients may need to have extra oxygen administered for a short period. 8. You will be asked to provide medical information, including your allergies and medications, prior to the procedure.  We must know immediately if you are taking blood thinners (like Coumadin/Warfarin) or if you are allergic to IV iodine contrast (dye).  We must know if you could possible be pregnant.  Possible side-effects:   Bleeding from needle site  Infection (rare, may require surgery)  Nerve injury  (rare)  Numbness & tingling (temporary)  Difficulty urinating (rare, temporary)  Spinal headache (a headache worse with upright posture)  Light-headedness (temporary)  Pain at injection site (serveral days)  Decreased blood pressure (rare, temporary)  Weakness in arm/leg (temporary)  Pressure sensation in back/neck (temporary)   Call if you experience:   Fever/chills associated with headache or increased back/neck pain  Headache worsened by an upright position  New onset, weakness or numbness of an extremity below the injection site  Hives or difficulty breathing (go to the emergency room)  Inflammation or drainage at the injection site(s)  Severe back/neck pain greater than usual  New symptoms which are concerning to you  Please note:  Although the local anesthetic injected can often make your back or neck feel good for several hours after the injection, the pain will likely return. It takes 3-7 days for steroids to work.  You may not notice any pain relief for at least one week.  If effective, we will often do a series of 2-3 injections spaced 3-6 weeks apart to maximally decrease your pain.  After the initial series, you may be a candidate for a more permanent nerve block of the facets.  If you have any questions, please call #[phone] [facility]

## 2017-11-30 ENCOUNTER — Other Ambulatory Visit: Payer: Self-pay | Admitting: Physician Assistant

## 2017-11-30 DIAGNOSIS — G4700 Insomnia, unspecified: Secondary | ICD-10-CM

## 2017-12-01 ENCOUNTER — Telehealth: Payer: Self-pay | Admitting: Physician Assistant

## 2017-12-01 NOTE — Telephone Encounter (Signed)
Received [employer] Claims/Disability form. Placed form in providers box. Thanks CC

## 2017-12-02 NOTE — Telephone Encounter (Signed)
Form faxed

## 2017-12-02 NOTE — Telephone Encounter (Signed)
Completed and given to Rachelle to fax today 12/02/17.

## 2017-12-07 NOTE — Telephone Encounter (Signed)
The original form is up front for patient ( if patient wants it) a copy was made for patient's chart. Thanks CC

## 2017-12-08 ENCOUNTER — Encounter: Payer: Self-pay | Admitting: Physician Assistant

## 2017-12-12 ENCOUNTER — Encounter: Payer: Self-pay | Admitting: Physician Assistant

## 2017-12-15 ENCOUNTER — Ambulatory Visit (HOSPITAL_BASED_OUTPATIENT_CLINIC_OR_DEPARTMENT_OTHER): Payer: BLUE CROSS/BLUE SHIELD | Admitting: Student in an Organized Health Care Education/Training Program

## 2017-12-15 ENCOUNTER — Other Ambulatory Visit: Payer: Self-pay

## 2017-12-15 ENCOUNTER — Ambulatory Visit
Admission: RE | Admit: 2017-12-15 | Discharge: 2017-12-15 | Disposition: A | Discharge status: Home / Self Care | Payer: BLUE CROSS/BLUE SHIELD | Source: Ambulatory Visit | Attending: Student in an Organized Health Care Education/Training Program | Admitting: Student in an Organized Health Care Education/Training Program

## 2017-12-15 ENCOUNTER — Encounter: Payer: Self-pay | Admitting: Student in an Organized Health Care Education/Training Program

## 2017-12-15 VITALS — BP 140/68 | HR 79 | Temp 98.1°F | Resp 16 | Ht 69.0 in | Wt 145.0 lb

## 2017-12-15 DIAGNOSIS — Z791 Long term (current) use of non-steroidal anti-inflammatories (NSAID): Secondary | ICD-10-CM | POA: Diagnosis not present

## 2017-12-15 DIAGNOSIS — M47812 Spondylosis without myelopathy or radiculopathy, cervical region: Secondary | ICD-10-CM | POA: Insufficient documentation

## 2017-12-15 DIAGNOSIS — Z79899 Other long term (current) drug therapy: Secondary | ICD-10-CM | POA: Insufficient documentation

## 2017-12-15 DIAGNOSIS — Z9889 Other specified postprocedural states: Secondary | ICD-10-CM | POA: Insufficient documentation

## 2017-12-15 DIAGNOSIS — Z888 Allergy status to other drugs, medicaments and biological substances status: Secondary | ICD-10-CM | POA: Diagnosis not present

## 2017-12-15 MED ORDER — FENTANYL CITRATE (PF) 100 MCG/2ML IJ SOLN
INTRAMUSCULAR | Status: AC
  Filled 2017-12-15: qty 2

## 2017-12-15 MED ORDER — ROPIVACAINE HCL 2 MG/ML IJ SOLN
INTRAMUSCULAR | Status: AC
  Filled 2017-12-15: qty 10

## 2017-12-15 MED ORDER — FENTANYL CITRATE (PF) 100 MCG/2ML IJ SOLN
25.0000 ug | INTRAMUSCULAR | Status: DC | PRN
  Administered 2017-12-15: 100 ug via INTRAVENOUS

## 2017-12-15 MED ORDER — DEXAMETHASONE SODIUM PHOSPHATE 10 MG/ML IJ SOLN
INTRAMUSCULAR | Status: AC
  Filled 2017-12-15: qty 1

## 2017-12-15 MED ORDER — LIDOCAINE HCL 1 % IJ SOLN
10.0000 mL | Freq: Once | INTRAMUSCULAR | Status: AC
  Administered 2017-12-15: 5 mL
  Filled 2017-12-15: qty 10

## 2017-12-15 MED ORDER — ROPIVACAINE HCL 2 MG/ML IJ SOLN
10.0000 mL | Freq: Once | INTRAMUSCULAR | Status: AC
  Administered 2017-12-15: 10 mL

## 2017-12-15 MED ORDER — DEXAMETHASONE SODIUM PHOSPHATE 10 MG/ML IJ SOLN
10.0000 mg | Freq: Once | INTRAMUSCULAR | Status: AC
  Administered 2017-12-15: 10 mg

## 2017-12-15 MED ORDER — LACTATED RINGERS IV SOLN
1000.0000 mL | Freq: Once | INTRAVENOUS | Status: AC
  Administered 2017-12-15: 1000 mL via INTRAVENOUS

## 2017-12-15 MED ORDER — LIDOCAINE HCL (PF) 1 % IJ SOLN
INTRAMUSCULAR | Status: AC
  Filled 2017-12-15: qty 5

## 2017-12-15 NOTE — Patient Instructions (Signed)

## 2017-12-15 NOTE — Progress Notes (Signed)
Safety precautions to be maintained throughout the outpatient stay will include: orient to surroundings, keep bed in low position, maintain call bell within reach at all times, provide assistance with transfer out of bed and ambulation.  

## 2017-12-15 NOTE — Progress Notes (Signed)
For for patient's Name: Gabrielle Pineda  MRN: [id number]  Referring Provider: Birdie Sons, MD  DOB: 1956-01-18  PCP: Birdie Sons, MD  DOS: 12/15/2017  Note by: [name], MD  Service setting: Ambulatory outpatient  Specialty: Interventional Pain Management  Patient type: Established  Location: [facility] (AMB) Pain Management Facility  Visit type: Interventional Procedure   Primary Reason for Visit: Interventional Pain Management Treatment. AS:NKNL pain  Procedure:       Anesthesia, Analgesia, Anxiolysis:  Type: Cervical Facet Medial Branch Block(s) #1  Primary Purpose: Diagnostic Region: Posterolateral cervical spine Level: C4, C5, C6, & C7 Medial Branch Level(s). Injecting these levels blocks the C4-5, C5-6, and C6-7 cervical facet joints. Laterality: Right-Sided Paraspinal  Type: Moderate (Conscious) Sedation combined with Local Anesthesia Indication(s): Analgesia and Anxiety Route: Intravenous (IV) IV Access: Secured Sedation: Meaningful verbal contact was maintained at all times during the procedure  Local Anesthetic: Lidocaine 1-2%   Indications: 1. Spondylosis of cervical region without myelopathy or radiculopathy    Pain Score: Pre-procedure: 10-Worst pain ever/10 Post-procedure: 3 /10  Pre-op Assessment:  Gabrielle Pineda is a 62 y.o. (year old), female patient, seen today for interventional treatment. She  has a past surgical history that includes neck disc surgery; Breast enhancement surgery (1981); Esophagogastroduodenoscopy; Tonsillectomy and adenoidectomy (1973 ); Carpal tunnel release (11/11/2011); Carpal tunnel release (2013); Rotator cuff repair (06/16/2016); Rotator cuff repair; and Colonoscopy with propofol (N/A, 11/01/2017). Gabrielle Pineda has a current medication list which includes the following prescription(s): atorvastatin, b complex vitamins, diclofenac sodium, fluocinonide ointment, folic acid, guaifenesin, ibuprofen, meloxicam, methocarbamol, methotrexate  (pf), multivitamin, misc natural products, omeprazole, pravastatin, sertraline, trazodone, valacyclovir, venlafaxine hcl, varenicline, and potassium chloride sa, and the following Facility-Administered Medications: fentanyl, ipratropium-albuterol, and lactated ringers. Her primarily concern today is the No chief complaint on file.  Initial Vital Signs:  Pulse/HCG Rate: 79ECG Heart Rate: 78 Temp: 98.1 F (36.7 C) Resp: (!) 22 BP: (!) 129/98 SpO2: 94 %  BMI: Estimated body mass index is 21.41 kg/m as calculated from the following:   Height as of this encounter: 5\' 9"  (1.753 m).   Weight as of this encounter: 145 lb (65.8 kg).  Risk Assessment: Allergies: Reviewed. She is allergic to plaquenil [hydroxychloroquine].  Allergy Precautions: None required Coagulopathies: Reviewed. None identified.  Blood-thinner therapy: None at this time Active Infection(s): Reviewed. None identified. Gabrielle Pineda is afebrile  Site Confirmation: Gabrielle Pineda was asked to confirm the procedure and laterality before marking the site Procedure checklist: Completed Consent: Before the procedure and under the influence of no sedative(s), amnesic(s), or anxiolytics, the patient was informed of the treatment options, risks and possible complications. To fulfill our ethical and legal obligations, as recommended by the American Medical Association's Code of Ethics, I have informed the patient of my clinical impression; the nature and purpose of the treatment or procedure; the risks, benefits, and possible complications of the intervention; the alternatives, including doing nothing; the risk(s) and benefit(s) of the alternative treatment(s) or procedure(s); and the risk(s) and benefit(s) of doing nothing. The patient was provided information about the general risks and possible complications associated with the procedure. These may include, but are not limited to: failure to achieve desired goals, infection, bleeding,  organ or nerve damage, allergic reactions, paralysis, and death. In addition, the patient was informed of those risks and complications associated to Spine-related procedures, such as failure to decrease pain; infection (i.e.: Meningitis, epidural or intraspinal abscess); bleeding (i.e.: epidural hematoma, subarachnoid hemorrhage, or any other  type of intraspinal or peri-dural bleeding); organ or nerve damage (i.e.: Any type of peripheral nerve, nerve root, or spinal cord injury) with subsequent damage to sensory, motor, and/or autonomic systems, resulting in permanent pain, numbness, and/or weakness of one or several areas of the body; allergic reactions; (i.e.: anaphylactic reaction); and/or death. Furthermore, the patient was informed of those risks and complications associated with the medications. These include, but are not limited to: allergic reactions (i.e.: anaphylactic or anaphylactoid reaction(s)); adrenal axis suppression; blood sugar elevation that in diabetics may result in ketoacidosis or comma; water retention that in patients with history of congestive heart failure may result in shortness of breath, pulmonary edema, and decompensation with resultant heart failure; weight gain; swelling or edema; medication-induced neural toxicity; particulate matter embolism and blood vessel occlusion with resultant organ, and/or nervous system infarction; and/or aseptic necrosis of one or more joints. Finally, the patient was informed that Medicine is not an exact science; therefore, there is also the possibility of unforeseen or unpredictable risks and/or possible complications that may result in a catastrophic outcome. The patient indicated having understood very clearly. We have given the patient no guarantees and we have made no promises. Enough time was given to the patient to ask questions, all of which were answered to the patient's satisfaction. Gabrielle Pineda has indicated that she wanted to continue  with the procedure. Attestation: I, the ordering provider, attest that I have discussed with the patient the benefits, risks, side-effects, alternatives, likelihood of achieving goals, and potential problems during recovery for the procedure that I have provided informed consent. Date  Time: 12/15/2017  9:09 AM  Pre-Procedure Preparation:  Monitoring: As per clinic protocol. Respiration, ETCO2, SpO2, BP, heart rate and rhythm monitor placed and checked for adequate function Safety Precautions: Patient was assessed for positional comfort and pressure points before starting the procedure. Time-out: I initiated and conducted the "Time-out" before starting the procedure, as per protocol. The patient was asked to participate by confirming the accuracy of the "Time Out" information. Verification of the correct person, site, and procedure were performed and confirmed by me, the nursing staff, and the patient. "Time-out" conducted as per Joint Commission's Universal Protocol (UP.01.01.01). Time: 0952  Description of Procedure:       Position: Prone with head of the table raised to facilitate breathing. Laterality: Right Level: C4, C5, C6, & C7 Medial Branch Level(s). Area Prepped: Posterior Cervico-thoracic Region Prepping solution: ChloraPrep (2% chlorhexidine gluconate and 70% isopropyl alcohol) Safety Precautions: Aspiration looking for blood return was conducted prior to all injections. At no point did we inject any substances, as a needle was being advanced. Before injecting, the patient was told to immediately notify me if she was experiencing any new onset of "ringing in the ears, or metallic taste in the mouth". No attempts were made at seeking any paresthesias. Safe injection practices and needle disposal techniques used. Medications properly checked for expiration dates. SDV (single dose vial) medications used. After the completion of the procedure, all disposable equipment used was discarded in the  proper designated medical waste containers. Local Anesthesia: Protocol guidelines were followed. The patient was positioned over the fluoroscopy table. The area was prepped in the usual manner. The time-out was completed. The target area was identified using fluoroscopy. A 12-in long, straight, sterile hemostat was used with fluoroscopic guidance to locate the targets for each level blocked. Once located, the skin was marked with an approved surgical skin marker. Once all sites were marked, the skin (epidermis,  dermis, and hypodermis), as well as deeper tissues (fat, connective tissue and muscle) were infiltrated with a small amount of a short-acting local anesthetic, loaded on a 10cc syringe with a 25G, 1.5-in  Needle. An appropriate amount of time was allowed for local anesthetics to take effect before proceeding to the next step. Local Anesthetic: Lidocaine 1.0% The unused portion of the local anesthetic was discarded in the proper designated containers. Technical explanation of process:   C4 Medial Branch Nerve Block (MBB): The target area for the C4 dorsal medial articular branch is the lateral concave waist of the articular pillar of C4. Under fluoroscopic guidance, a Quincke needle was inserted until contact was made with os over the postero-lateral aspect of the articular pillar of C4 (target area). After negative aspiration for blood, 29mL of the nerve block solution was injected without difficulty or complication. The needle was removed intact. C5 Medial Branch Nerve Block (MBB): The target area for the C5 dorsal medial articular branch is the lateral concave waist of the articular pillar of C5. Under fluoroscopic guidance, a Quincke needle was inserted until contact was made with os over the postero-lateral aspect of the articular pillar of C5 (target area). After negative aspiration for blood, 1 mL of the nerve block solution was injected without difficulty or complication. The needle was removed  intact. C6 Medial Branch Nerve Block (MBB): The target area for the C6 dorsal medial articular branch is the lateral concave waist of the articular pillar of C6. Under fluoroscopic guidance, a Quincke needle was inserted until contact was made with os over the postero-lateral aspect of the articular pillar of C6 (target area). After negative aspiration for blood, 34mL of the nerve block solution was injected without difficulty or complication. The needle was removed intact. C7 Medial Branch Nerve Block (MBB): The target for the C7 dorsal medial articular branch lies on the superior-medial tip of the C7 transverse process. Under fluoroscopic guidance, a Quincke needle was inserted until contact was made with os over the postero-lateral aspect of the articular pillar of C7 (target area). After negative aspiration for blood, 73mL of the nerve block solution was injected without difficulty or complication. The needle was removed intact.  Procedural Needles: 22-gauge, 3.5-inch, Quincke needles used for all levels. Nerve block solution: 5 cc solution made of 4 cc of 0.2% ropivacaine, 1 cc of Decadron 10 mg/cc.  1 cc injected at each level above.  The unused portion of the solution was discarded in the proper designated containers.  Once the entire procedure was completed, the treated area was cleaned, making sure to leave some of the prepping solution back to take advantage of its long term bactericidal properties.  Vitals:   12/15/17 1000 12/15/17 1006 12/15/17 1015 12/15/17 1025  BP: (!) 155/89 134/75 135/89 140/68  Pulse:      Resp: (!) 22 19 18 16   Temp:      TempSrc:      SpO2: 97% 98% 99% 98%  Weight:      Height:        Start Time: 0952 hrs. End Time: 1003 hrs.  Imaging Guidance (Spinal):  Type of Imaging Technique: Fluoroscopy Guidance (Spinal) Indication(s): Assistance in needle guidance and placement for procedures requiring needle placement in or near specific anatomical locations not  easily accessible without such assistance. Exposure Time: Please see nurses notes. Contrast: None used. Fluoroscopic Guidance: I was personally present during the use of fluoroscopy. "Tunnel Vision Technique" used to obtain the best possible  view of the target area. Parallax error corrected before commencing the procedure. "Direction-depth-direction" technique used to introduce the needle under continuous pulsed fluoroscopy. Once target was reached, antero-posterior, oblique, and lateral fluoroscopic projection used confirm needle placement in all planes. Images permanently stored in EMR. Interpretation: No contrast injected. I personally interpreted the imaging intraoperatively. Adequate needle placement confirmed in multiple planes. Permanent images saved into the patient's record.  Antibiotic Prophylaxis:   Anti-infectives (From admission, onward)   None     Indication(s): None identified  Post-operative Assessment:  Post-procedure Vital Signs:  Pulse/HCG Rate: 7967 Temp: 98.1 F (36.7 C) Resp: 16 BP: 140/68 SpO2: 98 %  EBL: None  Complications: No immediate post-treatment complications observed by team, or reported by patient.  Note: The patient tolerated the entire procedure well. A repeat set of vitals were taken after the procedure and the patient was kept under observation following institutional policy, for this type of procedure. Post-procedural neurological assessment was performed, showing return to baseline, prior to discharge. The patient was provided with post-procedure discharge instructions, including a section on how to identify potential problems. Should any problems arise concerning this procedure, the patient was given instructions to immediately contact us, at any time, without hesitation. In any case, we plan to contact the patient by telephone for a follow-up status report regarding this interventional procedure.  Comments:  No additional relevant  information.  Plan of Care   Imaging Orders     DG C-Arm 1-60 Min-No Report Procedure Orders    No procedure(s) ordered today    Medications ordered for procedure: Meds ordered this encounter  Medications  . lactated ringers infusion 1,000 mL  . fentaNYL (SUBLIMAZE) injection 25-100 mcg    Make sure Narcan is available in the pyxis when using this medication. In the event of respiratory depression (RR< 8/min): Titrate NARCAN (naloxone) in increments of 0.1 to 0.2 mg IV at 2-3 minute intervals, until desired degree of reversal.  . lidocaine (XYLOCAINE) 1 % (with pres) injection 10 mL  . ropivacaine (PF) 2 mg/mL (0.2%) (NAROPIN) injection 10 mL  . dexamethasone (DECADRON) injection 10 mg   Medications administered: We administered lactated ringers, fentaNYL, lidocaine, ropivacaine (PF) 2 mg/mL (0.2%), and dexamethasone.  See the medical record for exact dosing, route, and time of administration.  New Prescriptions   No medications on file   Disposition: Discharge home  Discharge Date & Time: 12/15/2017; 1030 hrs.   Physician-requested Follow-up: Return in about 3 weeks (around 01/05/2018) for Post Procedure Evaluation.  Future Appointments  Date Time Provider [place]  01/03/2018 12:15 PM [name], MD [facility] None   Primary Care Physician: Birdie Sons, MD Location: [facility] Note by: [name], MD Date: 12/15/2017; Time: 10:41 AM  Disclaimer:  Medicine is not an exact science. The only guarantee in medicine is that nothing is guaranteed. It is important to note that the decision to proceed with this intervention was based on the information collected from the patient. The Data and conclusions were drawn from the patient's questionnaire, the interview, and the physical examination. Because the information was provided in large part by the patient, it cannot be guaranteed that it has not been purposely or unconsciously  manipulated. Every effort has been made to obtain as much relevant data as possible for this evaluation. It is important to note that the conclusions that lead to this procedure are derived in large part from the available data. Always take into account that the  treatment will also be dependent on availability of resources and existing treatment guidelines, considered by other Pain Management Practitioners as being common knowledge and practice, at the time of the intervention. For Medico-Legal purposes, it is also important to point out that variation in procedural techniques and pharmacological choices are the acceptable norm. The indications, contraindications, technique, and results of the above procedure should only be interpreted and judged by a Board-Certified Interventional Pain Specialist with extensive familiarity and expertise in the same exact procedure and technique.

## 2017-12-16 ENCOUNTER — Telehealth: Payer: Self-pay | Admitting: *Deleted

## 2017-12-16 NOTE — Telephone Encounter (Signed)
Spoke with patient and she denies any questions or concerns re; procedure.

## 2017-12-28 ENCOUNTER — Encounter: Payer: Self-pay | Admitting: Student in an Organized Health Care Education/Training Program

## 2018-01-03 ENCOUNTER — Other Ambulatory Visit: Payer: Self-pay

## 2018-01-03 ENCOUNTER — Encounter: Payer: Self-pay | Admitting: Student in an Organized Health Care Education/Training Program

## 2018-01-03 ENCOUNTER — Ambulatory Visit
Payer: BLUE CROSS/BLUE SHIELD | Attending: Student in an Organized Health Care Education/Training Program | Admitting: Student in an Organized Health Care Education/Training Program

## 2018-01-03 VITALS — BP 139/97 | HR 78 | Temp 98.8°F | Resp 16 | Ht 69.0 in | Wt 145.0 lb

## 2018-01-03 DIAGNOSIS — Z79899 Other long term (current) drug therapy: Secondary | ICD-10-CM | POA: Insufficient documentation

## 2018-01-03 DIAGNOSIS — Z800 Family history of malignant neoplasm of digestive organs: Secondary | ICD-10-CM | POA: Diagnosis not present

## 2018-01-03 DIAGNOSIS — Q761 Klippel-Feil syndrome: Secondary | ICD-10-CM | POA: Diagnosis not present

## 2018-01-03 DIAGNOSIS — K219 Gastro-esophageal reflux disease without esophagitis: Secondary | ICD-10-CM | POA: Diagnosis not present

## 2018-01-03 DIAGNOSIS — M5412 Radiculopathy, cervical region: Secondary | ICD-10-CM | POA: Diagnosis not present

## 2018-01-03 DIAGNOSIS — M5010 Cervical disc disorder with radiculopathy, unspecified cervical region: Secondary | ICD-10-CM | POA: Diagnosis not present

## 2018-01-03 DIAGNOSIS — Z87891 Personal history of nicotine dependence: Secondary | ICD-10-CM | POA: Diagnosis not present

## 2018-01-03 DIAGNOSIS — G43C0 Periodic headache syndromes in child or adult, not intractable: Secondary | ICD-10-CM | POA: Diagnosis not present

## 2018-01-03 DIAGNOSIS — M542 Cervicalgia: Secondary | ICD-10-CM | POA: Diagnosis present

## 2018-01-03 DIAGNOSIS — M47812 Spondylosis without myelopathy or radiculopathy, cervical region: Secondary | ICD-10-CM | POA: Insufficient documentation

## 2018-01-03 DIAGNOSIS — J449 Chronic obstructive pulmonary disease, unspecified: Secondary | ICD-10-CM | POA: Insufficient documentation

## 2018-01-03 DIAGNOSIS — F329 Major depressive disorder, single episode, unspecified: Secondary | ICD-10-CM | POA: Diagnosis not present

## 2018-01-03 DIAGNOSIS — G894 Chronic pain syndrome: Secondary | ICD-10-CM | POA: Insufficient documentation

## 2018-01-03 DIAGNOSIS — M5030 Other cervical disc degeneration, unspecified cervical region: Secondary | ICD-10-CM | POA: Diagnosis not present

## 2018-01-03 DIAGNOSIS — Z9889 Other specified postprocedural states: Secondary | ICD-10-CM | POA: Diagnosis not present

## 2018-01-03 DIAGNOSIS — D649 Anemia, unspecified: Secondary | ICD-10-CM | POA: Insufficient documentation

## 2018-01-03 MED ORDER — TOPIRAMATE 25 MG PO TABS: ORAL_TABLET | ORAL | 2 refills | Status: DC

## 2018-01-03 NOTE — Progress Notes (Signed)
Patient's Name: Gabrielle Pineda  MRN: [id number]  Referring Provider: Birdie Sons, MD  DOB: 1956-01-12  PCP: Birdie Sons, MD  DOS: 01/03/2018  Note by: [name], MD  Service setting: Ambulatory outpatient  Specialty: Interventional Pain Management  Location: [facility] (AMB) Pain Management Facility    Patient type: Established   Primary Reason(s) for Visit: Encounter for post-procedure evaluation of chronic illness with mild to moderate exacerbation CC: Neck Pain  HPI  Gabrielle Pineda is a 62 y.o. year old, female patient, who comes today for a post-procedure evaluation. She has Hyperlipidemia; SMOKER; Paroxysmal supraventricular tachycardia ([place]); CHEST PAIN UNSPECIFIED; ELECTROCARDIOGRAM, ABNORMAL; Shortness of breath; Closed fracture of distal phalanx of thumb; Acid reflux; Arthritis, degenerative; Arthritis or polyarthritis, rheumatoid ([place]); B-complex deficiency; CN (constipation); Clinical depression; Family history of malignant neoplasm of pancreas; Cold sore; H/O alcohol abuse; Hypercholesteremia; Disorder of iron metabolism; Cannot sleep; Localized osteoarthrosis, hand; Menopausal symptom; Weight loss; Delayed onset of urination; COPD (chronic obstructive pulmonary disease) ([place]); Closed compression fracture of L5 lumbar vertebra; Sacral insufficiency fracture with routine healing; Essential hypertension; Cervical radiculopathy; Neuropathy; and History of adenomatous polyp of colon on their problem list. Her primarily concern today is the Neck Pain  Pain Assessment: Location: Right Neck Radiating: across the shoulders Onset: More than a month ago Duration: Chronic pain Quality: Aching, Constant Severity: 4 /10 (subjective, self-reported pain score)  Note: Reported level is compatible with observation.                         When using our objective Pain Scale, levels between 6 and 10/10 are said to belong in an emergency room, as it progressively worsens from a 6/10, described as  severely limiting, requiring emergency care not usually available at an outpatient pain management facility. At a 6/10 level, communication becomes difficult and requires great effort. Assistance to reach the emergency department may be required. Facial flushing and profuse sweating along with potentially dangerous increases in heart rate and blood pressure will be evident. Effect on ADL: none Timing: Constant Modifying factors: procedure BP: (!) 139/97  HR: 78  Gabrielle Pineda comes in today for post-procedure evaluation after the treatment done on 12/16/2017.  Further details on both, my assessment(s), as well as the proposed treatment plan, please see below.  Post-Procedure Assessment  12/15/2017 Procedure: Right C4, 5, 6, 7 facet medial branch nerve block Pre-procedure pain score:  10/10 Post-procedure pain score: 3/10         Influential Factors: BMI: 21.41 kg/m Intra-procedural challenges: None observed.         Assessment challenges: None detected.              Reported side-effects: None.        Post-procedural adverse reactions or complications: None reported         Sedation: Please see nurses note. When no sedatives are used, the analgesic levels obtained are directly associated to the effectiveness of the local anesthetics. However, when sedation is provided, the level of analgesia obtained during the initial 1 hour following the intervention, is believed to be the result of a combination of factors. These factors may include, but are not limited to: 1. The effectiveness of the local anesthetics used. 2. The effects of the analgesic(s) and/or anxiolytic(s) used. 3. The degree of discomfort experienced by the patient at the time of the procedure. 4. The patients ability and reliability in recalling and recording the events. 5. The presence and  influence of possible secondary gains and/or psychosocial factors. Reported result: Relief experienced during the 1st hour after the  procedure: 80 % (Ultra-Short Term Relief)            Interpretative annotation: Clinically appropriate result. Analgesia during this period is likely to be Local Anesthetic and/or IV Sedative (Analgesic/Anxiolytic) related.          Effects of local anesthetic: The analgesic effects attained during this period are directly associated to the localized infiltration of local anesthetics and therefore cary significant diagnostic value as to the etiological location, or anatomical origin, of the pain. Expected duration of relief is directly dependent on the pharmacodynamics of the local anesthetic used. Long-acting (4-6 hours) anesthetics used.  Reported result: Relief during the next 4 to 6 hour after the procedure: 90 % (Short-Term Relief)            Interpretative annotation: Clinically appropriate result. Analgesia during this period is likely to be Local Anesthetic-related.          Long-term benefit: Defined as the period of time past the expected duration of local anesthetics (1 hour for short-acting and 4-6 hours for long-acting). With the possible exception of prolonged sympathetic blockade from the local anesthetics, benefits during this period are typically attributed to, or associated with, other factors such as analgesic sensory neuropraxia, antiinflammatory effects, or beneficial biochemical changes provided by agents other than the local anesthetics.  Reported result: Extended relief following procedure: 70 % (Long-Term Relief)            Interpretative annotation: Clinically appropriate result. Good relief. Therapeutic success. Inflammation plays a part in the etiology to the pain.          Current benefits: Defined as reported results that persistent at this point in time.   Analgesia: 50-75 %            Function: Gabrielle Pineda reports improvement in function ROM: Gabrielle Pineda reports improvement in ROM Interpretative annotation: Ongoing benefit. Limited therapeutic benefit. Effective  diagnostic intervention.          Interpretation: Results would suggest a successful diagnostic and therapeutic intervention.                  Plan:  Please see "Plan of Care" for details.                Laboratory Chemistry  Inflammation Markers (CRP: Acute Phase) (ESR: Chronic Phase) No results found for: CRP, ESRSEDRATE, LATICACIDVEN                       Rheumatology Markers No results found for: RF, ANA, Therisa D[place], [facility]                      Renal Function Markers Lab Results  Component Value Date   BUN 10 04/16/2017   CREATININE 0.73 04/16/2017   BCR 14 04/16/2017   GFRAA 104 04/16/2017   GFRNONAA 90 04/16/2017                              Hepatic Function Markers Lab Results  Component Value Date   AST 18 04/16/2017   ALT 12 04/16/2017   ALBUMIN 4.3 04/16/2017   ALKPHOS 99 04/16/2017                        Electrolytes Lab Results  Component Value Date   NA 144 04/16/2017   K 3.8 04/16/2017   CL 110 (H) 04/16/2017   CALCIUM 9.3 04/16/2017                        Neuropathy Markers Lab Results  Component Value Date   HGBA1C 5.2 04/16/2017                        Bone Pathology Markers No results found for: [place], [id number], [id number], [id number], 25OHVITD1, 25OHVITD2, 25OHVITD3, TESTOFREE, TESTOSTERONE                       Coagulation Parameters Lab Results  Component Value Date   PLT 297 04/16/2017                        Cardiovascular Markers Lab Results  Component Value Date   HGB 12.0 04/16/2017   HCT 35.1 04/16/2017                         CA Markers No results found for: CEA, CA125, LABCA2                      Note: Lab results reviewed.  Recent Diagnostic Imaging Results  DG C-Arm 1-60 Min-No Report Fluoroscopy was utilized by the requesting physician.  No radiographic  interpretation.   Complexity Note: Imaging results reviewed. Results shared with Gabrielle Pineda, using Layman's terms.                          Meds   Current Outpatient Medications:  .  atorvastatin (LIPITOR) 40 MG tablet, TAKE 1 TABLET BY MOUTH ONCE DAILY, Disp: 90 tablet, Rfl: 3 .  b complex vitamins tablet, Take 1 tablet by mouth daily. B12, Disp: , Rfl:  .  diclofenac sodium (VOLTAREN) 1 % GEL, Apply topically 4 (four) times daily., Disp: , Rfl:  .  fluocinonide ointment (LIDEX) 0.73 %, Apply 1 application topically 2 (two) times daily., Disp: , Rfl:  .  FOLIC ACID PO, Take 10 mg tablet twice a day., Disp: , Rfl:  .  guaiFENesin (MUCINEX) 600 MG 12 hr tablet, Take 1,200 mg by mouth 2 (two) times daily., Disp: , Rfl:  .  ibuprofen (ADVIL,MOTRIN) 200 MG tablet, Take 200 mg by mouth every 6 (six) hours as needed. For pain, Disp: , Rfl:  .  meloxicam (MOBIC) 7.5 MG tablet, Take 7.5 mg by mouth daily., Disp: , Rfl:  .  methocarbamol (ROBAXIN) 500 MG tablet, Take 500 mg by mouth 4 (four) times daily., Disp: , Rfl:  .  Methotrexate, PF, 25 MG/0.5ML SOAJ, 0.80m once weekly, Disp: 0.5 mL, Rfl: 11 .  Multiple Vitamin (MULTIVITAMIN) tablet, Take 1 tablet by mouth daily.  , Disp: , Rfl:  .  Nutritional Supplements (ESTROVEN ENERGY PO), Take by mouth daily., Disp: , Rfl:  .  omeprazole (PRILOSEC) 20 MG capsule, Take 20 mg by mouth daily., Disp: , Rfl:  .  pravastatin (PRAVACHOL) 40 MG tablet, Take 40 mg by mouth daily., Disp: , Rfl:  .  sertraline (ZOLOFT) 100 MG tablet, TAKE 1 TABLET BY MOUTH ONCE DAILY, Disp: 90 tablet, Rfl: 1 .  traZODone (DESYREL) 50 MG tablet, TAKE 1 TO 2 TABLETS BY MOUTH AT BEDTIME, Disp: 180 tablet, Rfl: 1 .  valACYclovir (  VALTREX) 1000 MG tablet, TAKE TWO TABLETS BY MOUTH TWICE DAILY FOR 5 DAYS AS NEEDED, Disp: 40 tablet, Rfl: 3 .  Venlafaxine HCl 75 MG TB24, TAKE 1 TABLET BY MOUTH THREE TIMES DAILY (Patient taking differently: TAKE 3 TABLET BY MOUTH DAILY), Disp: 270 tablet, Rfl: 3 .  topiramate (TOPAMAX) 25 MG tablet, 25 mg qhs x 2 weeks and then increase to 25 mg BID, Disp: 60 tablet, Rfl: 2 .  varenicline  (CHANTIX CONTINUING MONTH PAK) 1 MG tablet, Take 1 tablet (1 mg total) by mouth 2 (two) times daily. (Patient not taking: Reported on 08/02/2017), Disp: 60 tablet, Rfl: 4  Current Facility-Administered Medications:  .  ipratropium-albuterol (DUONEB) 0.5-2.5 (3) MG/3ML nebulizer solution 3 mL, 3 mL, Nebulization, Once, [place], Adriana M, PA-C  ROS  Constitutional: Denies any fever or chills Gastrointestinal: No reported hemesis, hematochezia, vomiting, or acute GI distress Musculoskeletal: Denies any acute onset joint swelling, redness, loss of ROM, or weakness Neurological: No reported episodes of acute onset apraxia, aphasia, dysarthria, agnosia, amnesia, paralysis, loss of coordination, or loss of consciousness  Allergies  Gabrielle Pineda is allergic to plaquenil [hydroxychloroquine].  [place]  Drug: Gabrielle Pineda  reports that she does not use drugs. Alcohol:  reports that she does not drink alcohol. Tobacco:  reports that she has been smoking.  She has a 20.00 pack-year smoking history. She has never used smokeless tobacco. Medical:  has a past medical history of Alcohol abuse, Anemia, Arthritis, Cervicalgia, COPD (chronic obstructive pulmonary disease) ([place]), Depression, GERD (gastroesophageal reflux disease), Heart murmur, Other and unspecified hyperlipidemia, and Tachycardia. Surgical: Gabrielle Pineda  has a past surgical history that includes neck disc surgery; Breast enhancement surgery (1981); Esophagogastroduodenoscopy; Tonsillectomy and adenoidectomy (1973 ); Carpal tunnel release (11/11/2011); Carpal tunnel release (2013); Rotator cuff repair (06/16/2016); Rotator cuff repair; and Colonoscopy with propofol (N/A, 11/01/2017). Family: family history includes Alcohol abuse in her father and mother; Bipolar disorder in her mother; Pancreatic cancer in her father; Suicidality in her mother.  Constitutional Exam  General appearance: Well nourished, well developed, and well hydrated. In no  apparent acute distress Vitals:   01/03/18 1250  BP: (!) 139/97  Pulse: 78  Resp: 16  Temp: 98.8 F (37.1 C)  TempSrc: Oral  SpO2: 97%  Weight: 145 lb (65.8 kg)  Height: _0  (1.753 m)   BMI Assessment: Estimated body mass index is 21.41 kg/m as calculated from the following:   Height as of this encounter: _1  (1.753 m).   Weight as of this encounter: 145 lb (65.8 kg).  BMI interpretation table: BMI level Category Range association with higher incidence of chronic pain  <18 kg/m2 Underweight   18.5-24.9 kg/m2 Ideal body weight   25-29.9 kg/m2 Overweight Increased incidence by 20%  30-34.9 kg/m2 Obese (Class I) Increased incidence by 68%  35-39.9 kg/m2 Severe obesity (Class II) Increased incidence by 136%  >40 kg/m2 Extreme obesity (Class III) Increased incidence by 254%   Patient's current BMI Ideal Body weight  Body mass index is 21.41 kg/m. Ideal body weight: 66.2 kg (145 lb 15.1 oz)   BMI Readings from Last 4 Encounters:  01/03/18 21.41 kg/m  12/15/17 21.41 kg/m  11/24/17 21.27 kg/m  11/01/17 20.82 kg/m   Wt Readings from Last 4 Encounters:  01/03/18 145 lb (65.8 kg)  12/15/17 145 lb (65.8 kg)  11/24/17 144 lb (65.3 kg)  11/01/17 141 lb (64 kg)  Psych/Mental status: Alert, oriented x 3 (person, place, & time)  Eyes: PERLA Respiratory: No evidence of acute respiratory distress  Cervical Spine Area Exam  Skin & Axial Inspection: Well healed scar from previous spine surgery detected Alignment: Symmetrical Functional ROM: Improved after treatment, bilaterally specifically with lateral rotation, less so with flexion extension Stability: No instability detected Muscle Tone/Strength: Functionally intact. No obvious neuro-muscular anomalies detected. Sensory (Neurological): Articular pain pattern, improved after treatment Palpation: Complains of area being tender to palpation Positive provocative maneuver for for bilateral occipital neuralgia and cervical  facet disease  Upper Extremity (UE) Exam    Side: Right upper extremity  Side: Left upper extremity  Skin & Extremity Inspection: Skin color, temperature, and hair growth are WNL. No peripheral edema or cyanosis. No masses, redness, swelling, asymmetry, or associated skin lesions. No contractures.  Skin & Extremity Inspection: Skin color, temperature, and hair growth are WNL. No peripheral edema or cyanosis. No masses, redness, swelling, asymmetry, or associated skin lesions. No contractures.  Functional ROM: Unrestricted ROM          Functional ROM: Unrestricted ROM          Muscle Tone/Strength: Functionally intact. No obvious neuro-muscular anomalies detected.  Muscle Tone/Strength: Functionally intact. No obvious neuro-muscular anomalies detected.  Sensory (Neurological): Unimpaired          Sensory (Neurological): Unimpaired          Palpation: No palpable anomalies              Palpation: No palpable anomalies              Provocative Test(s):  Phalen's test: deferred Tinel's test: deferred Apley scratch test (Rotator Cuff ROM): deferred  Provocative Test(s):  Phalen's test: deferred Tinel's test: deferred Apley scratch test (Rotator Cuff ROM): deferred   Thoracic Spine Area Exam  Skin & Axial Inspection: No masses, redness, or swelling Alignment: Symmetrical Functional ROM: Unrestricted ROM Stability: No instability detected Muscle Tone/Strength: Functionally intact. No obvious neuro-muscular anomalies detected. Sensory (Neurological): Unimpaired Muscle strength & Tone: No palpable anomalies  Lumbar Spine Area Exam  Skin & Axial Inspection: No masses, redness, or swelling Alignment: Symmetrical Functional ROM: Unrestricted ROM       Stability: No instability detected Muscle Tone/Strength: Functionally intact. No obvious neuro-muscular anomalies detected. Sensory (Neurological): Unimpaired Palpation: No palpable anomalies       Provocative Tests: Lumbar  Hyperextension/rotation test: deferred today       Lumbar quadrant test (Kemp's test): deferred today       Lumbar Lateral bending test: deferred today       Patrick's Maneuver: deferred today                   FABER test: deferred today       Thigh-thrust test: deferred today       S-I compression test: deferred today       S-I distraction test: deferred today        Gait & Posture Assessment  Ambulation: Unassisted Gait: Relatively normal for age and body habitus Posture: WNL   Lower Extremity Exam    Side: Right lower extremity  Side: Left lower extremity  Stability: No instability observed          Stability: No instability observed          Skin & Extremity Inspection: Skin color, temperature, and hair growth are WNL. No peripheral edema or cyanosis. No masses, redness, swelling, asymmetry, or associated skin lesions. No contractures.  Skin & Extremity Inspection: Skin color, temperature, and hair  growth are WNL. No peripheral edema or cyanosis. No masses, redness, swelling, asymmetry, or associated skin lesions. No contractures.  Functional ROM: Unrestricted ROM                  Functional ROM: Unrestricted ROM                  Muscle Tone/Strength: Functionally intact. No obvious neuro-muscular anomalies detected.  Muscle Tone/Strength: Functionally intact. No obvious neuro-muscular anomalies detected.  Sensory (Neurological): Unimpaired  Sensory (Neurological): Unimpaired  Palpation: No palpable anomalies  Palpation: No palpable anomalies   Assessment  Primary Diagnosis & Pertinent Problem List: The primary encounter diagnosis was Spondylosis of cervical region without myelopathy or radiculopathy. Diagnoses of DDD (degenerative disc disease), cervical, Cervical fusion syndrome, Chronic pain syndrome, Cervicalgia, Cervical radiculopathy, and Periodic headache syndrome, not intractable were also pertinent to this visit.  Status Diagnosis  Responding Persistent Persistent 1.  Spondylosis of cervical region without myelopathy or radiculopathy   2. DDD (degenerative disc disease), cervical   3. Cervical fusion syndrome   4. Chronic pain syndrome   5. Cervicalgia   6. Cervical radiculopathy   7. Periodic headache syndrome, not intractable     General Recommendations: The pain condition that the patient suffers from is best treated with a multidisciplinary approach that involves an increase in physical activity to prevent de-conditioning and worsening of the pain cycle, as well as psychological counseling (formal and/or informal) to address the co-morbid psychological affects of pain. Treatment will often involve judicious use of pain medications and interventional procedures to decrease the pain, allowing the patient to participate in the physical activity that will ultimately produce long-lasting pain reductions. The goal of the multidisciplinary approach is to return the patient to a higher level of overall function and to restore their ability to perform activities of daily living.  62 year old female with a primary complaint of neck pain that radiates down into bilateral hands. Patient's past medical history significant for rheumatoid arthritis,cervical radiculopathy and fusion with extensive cervical spine surgery hx as follow:2012 C3-4 fusion, 2014 C4-6 fusion and 2017 C6-T1 fusion.  Patient is status post right C7-T1 epidural steroid injection done on 10/27/2017 which was not beneficial for her neck and arm symptoms.  She returns today for postprocedural follow-up status post right C4, 5, 6, 7 facet medial branch nerve block which has provided her with significant pain relief for the first 2 weeks approximately which is still ongoing to a moderate extent.  She currently describes her pain relief as 70% with improvement in lateral cervical range of motion.  We discussed repeating this procedure in the future when her neck pain symptoms return.  At this point she wants to  hold off on repeating the procedure as she is still experiencing moderate to significant benefit from it.  Patient is endorsing persistent headaches that could either be secondary to migraines versus occipital neuralgia.  Patient denies any history of glaucoma or kidney stones.  I recommended Topamax 25 mg nightly for 2 weeks and increase to 25 mill grams twice daily to help with migraine prophylaxis.  If this is not effective, we discussed occipital nerve blocks with possible radiofrequency ablation (pulsed) of these nerves.  Plan: -Consideration of repeat right C4, 5, 6, 7 cervical facet medial branch nerve block, #2 -Topamax 25 mg nightly x2 weeks then 25 mill grams twice daily for migraine prophylaxis -Consideration of occipital nerve blocks for occipital neuralgia. -Continue gabapentin and Effexor as previously prescribed.  Plan of Care  Pharmacotherapy (Medications Ordered): Meds ordered this encounter  Medications  . topiramate (TOPAMAX) 25 MG tablet    Sig: 25 mg qhs x 2 weeks and then increase to 25 mg BID    Dispense:  60 tablet    Refill:  2   Lab-work, procedure(s), and/or referral(s): Orders Placed This Encounter  Procedures  . CERVICAL FACET (MEDIAL BRANCH NERVE BLOCK)     Previous interventions Cervical epidural steroid injection (right C7-T1), 10/27/2017: Not effective Right C4, 5, 6, 7 cervical facet medial branch nerve block, 12/15/2017, moderate to significant benefit along with improvement in lateral cervical range of motion  Time Note: Greater than 50% of the 25 minute(s) of face-to-face time spent with Gabrielle Pineda, was spent in counseling/coordination of care regarding: Gabrielle Pineda primary cause of pain, the treatment plan, treatment alternatives, the risks and possible complications of proposed treatment, medication side effects, the results, interpretation and significance of  her recent diagnostic interventional treatment(s), the appropriate use of her  medications and realistic expectations.  Provider-requested follow-up: Return in about 6 weeks (around 02/14/2018) for Medication Management.  Future Appointments  Date Time Provider [place]  02/21/2018  1:45 PM [name], MD [facility] None    Primary Care Physician: Birdie Sons, MD Location: [facility] Note by: [name], M.D Date: 01/03/2018; Time: 2:29 PM  There are no Patient Instructions on file for this visit.

## 2018-01-18 ENCOUNTER — Encounter: Payer: Self-pay | Admitting: Physician Assistant

## 2018-01-18 DIAGNOSIS — F321 Major depressive disorder, single episode, moderate: Secondary | ICD-10-CM

## 2018-01-18 DIAGNOSIS — F411 Generalized anxiety disorder: Secondary | ICD-10-CM

## 2018-01-19 MED ORDER — BUSPIRONE HCL 15 MG PO TABS: 15.0000 mg | ORAL_TABLET | Freq: Three times a day (TID) | ORAL | 0 refills | Status: DC

## 2018-02-03 ENCOUNTER — Other Ambulatory Visit: Payer: Self-pay | Admitting: *Deleted

## 2018-02-03 MED ORDER — VALACYCLOVIR HCL 1 G PO TABS: ORAL_TABLET | ORAL | 3 refills | Status: DC

## 2018-02-11 ENCOUNTER — Encounter: Payer: Self-pay | Admitting: Physician Assistant

## 2018-02-21 ENCOUNTER — Ambulatory Visit
Payer: BLUE CROSS/BLUE SHIELD | Attending: Student in an Organized Health Care Education/Training Program | Admitting: Student in an Organized Health Care Education/Training Program

## 2018-02-21 ENCOUNTER — Encounter: Payer: Self-pay | Admitting: Student in an Organized Health Care Education/Training Program

## 2018-02-21 ENCOUNTER — Other Ambulatory Visit: Payer: Self-pay

## 2018-02-21 VITALS — BP 147/92 | HR 82 | Temp 98.3°F | Resp 18 | Ht 69.0 in | Wt 145.0 lb

## 2018-02-21 DIAGNOSIS — Q761 Klippel-Feil syndrome: Secondary | ICD-10-CM | POA: Diagnosis not present

## 2018-02-21 DIAGNOSIS — Z791 Long term (current) use of non-steroidal anti-inflammatories (NSAID): Secondary | ICD-10-CM | POA: Diagnosis not present

## 2018-02-21 DIAGNOSIS — Z888 Allergy status to other drugs, medicaments and biological substances status: Secondary | ICD-10-CM | POA: Insufficient documentation

## 2018-02-21 DIAGNOSIS — M47812 Spondylosis without myelopathy or radiculopathy, cervical region: Secondary | ICD-10-CM

## 2018-02-21 DIAGNOSIS — M19049 Primary osteoarthritis, unspecified hand: Secondary | ICD-10-CM | POA: Diagnosis not present

## 2018-02-21 DIAGNOSIS — K219 Gastro-esophageal reflux disease without esophagitis: Secondary | ICD-10-CM | POA: Insufficient documentation

## 2018-02-21 DIAGNOSIS — M069 Rheumatoid arthritis, unspecified: Secondary | ICD-10-CM | POA: Diagnosis not present

## 2018-02-21 DIAGNOSIS — M542 Cervicalgia: Secondary | ICD-10-CM

## 2018-02-21 DIAGNOSIS — J449 Chronic obstructive pulmonary disease, unspecified: Secondary | ICD-10-CM | POA: Diagnosis not present

## 2018-02-21 DIAGNOSIS — Z79899 Other long term (current) drug therapy: Secondary | ICD-10-CM | POA: Diagnosis not present

## 2018-02-21 DIAGNOSIS — F329 Major depressive disorder, single episode, unspecified: Secondary | ICD-10-CM | POA: Insufficient documentation

## 2018-02-21 DIAGNOSIS — E785 Hyperlipidemia, unspecified: Secondary | ICD-10-CM | POA: Insufficient documentation

## 2018-02-21 DIAGNOSIS — Z86010 Personal history of colonic polyps: Secondary | ICD-10-CM | POA: Diagnosis not present

## 2018-02-21 DIAGNOSIS — M4722 Other spondylosis with radiculopathy, cervical region: Secondary | ICD-10-CM | POA: Insufficient documentation

## 2018-02-21 DIAGNOSIS — Z818 Family history of other mental and behavioral disorders: Secondary | ICD-10-CM | POA: Insufficient documentation

## 2018-02-21 DIAGNOSIS — M5030 Other cervical disc degeneration, unspecified cervical region: Secondary | ICD-10-CM | POA: Diagnosis not present

## 2018-02-21 DIAGNOSIS — E539 Vitamin B deficiency, unspecified: Secondary | ICD-10-CM | POA: Diagnosis not present

## 2018-02-21 DIAGNOSIS — M4322 Fusion of spine, cervical region: Secondary | ICD-10-CM | POA: Insufficient documentation

## 2018-02-21 DIAGNOSIS — G629 Polyneuropathy, unspecified: Secondary | ICD-10-CM | POA: Diagnosis not present

## 2018-02-21 DIAGNOSIS — G894 Chronic pain syndrome: Secondary | ICD-10-CM | POA: Diagnosis not present

## 2018-02-21 DIAGNOSIS — Z800 Family history of malignant neoplasm of digestive organs: Secondary | ICD-10-CM | POA: Diagnosis not present

## 2018-02-21 DIAGNOSIS — K5900 Constipation, unspecified: Secondary | ICD-10-CM | POA: Diagnosis not present

## 2018-02-21 DIAGNOSIS — F17210 Nicotine dependence, cigarettes, uncomplicated: Secondary | ICD-10-CM | POA: Insufficient documentation

## 2018-02-21 DIAGNOSIS — M5010 Cervical disc disorder with radiculopathy, unspecified cervical region: Secondary | ICD-10-CM | POA: Diagnosis not present

## 2018-02-21 DIAGNOSIS — Z811 Family history of alcohol abuse and dependence: Secondary | ICD-10-CM | POA: Diagnosis not present

## 2018-02-21 NOTE — Progress Notes (Signed)
Safety precautions to be maintained throughout the outpatient stay will include: orient to surroundings, keep bed in low position, maintain call bell within reach at all times, provide assistance with transfer out of bed and ambulation.  

## 2018-02-21 NOTE — Progress Notes (Signed)
Patient's Name: Gabrielle Pineda  MRN: [id number]  Referring Provider: Birdie Sons, MD  DOB: 12/29/1955  PCP: Birdie Sons, MD  DOS: 02/21/2018  Note by: [name], MD  Service setting: Ambulatory outpatient  Specialty: Interventional Pain Management  Location: [facility] (AMB) Pain Management Facility    Patient type: Established   Primary Reason(s) for Visit: Evaluation of chronic illnesses with exacerbation, or progression (Level of risk: moderate) CC: Follow-up  HPI  Ms. Guzy is a 62 y.o. year old, female patient, who comes today for a follow-up evaluation. She has Hyperlipidemia; SMOKER; Paroxysmal supraventricular tachycardia ([place]); CHEST PAIN UNSPECIFIED; ELECTROCARDIOGRAM, ABNORMAL; Shortness of breath; Closed fracture of distal phalanx of thumb; Acid reflux; Arthritis, degenerative; Arthritis or polyarthritis, rheumatoid ([place]); B-complex deficiency; CN (constipation); Clinical depression; Family history of malignant neoplasm of pancreas; Cold sore; H/O alcohol abuse; Hypercholesteremia; Disorder of iron metabolism; Cannot sleep; Localized osteoarthrosis, hand; Menopausal symptom; Weight loss; Delayed onset of urination; COPD (chronic obstructive pulmonary disease) ([place]); Closed compression fracture of L5 lumbar vertebra; Sacral insufficiency fracture with routine healing; Essential hypertension; Cervical radiculopathy; Neuropathy; and History of adenomatous polyp of colon on their problem list. Ms. Sakamoto was last seen on 01/03/2018. Her primarily concern today is the Follow-up  Pain Assessment: Location: Right, Left, Anterior Head Radiating: Radiates from back of neck up to back of head. Pain worse head anterior and bilateral  Onset: More than a month ago Duration: Chronic pain Quality: Aching, Constant Severity: 8 /10 (subjective, self-reported pain score)  Note: Reported level is inconsistent with clinical observations.                         When using our objective Pain  Scale, levels between 6 and 10/10 are said to belong in an emergency room, as it progressively worsens from a 6/10, described as severely limiting, requiring emergency care not usually available at an outpatient pain management facility. At a 6/10 level, communication becomes difficult and requires great effort. Assistance to reach the emergency department may be required. Facial flushing and profuse sweating along with potentially dangerous increases in heart rate and blood pressure will be evident. Effect on ADL: "Limits what I do and not as active"  Timing: Constant Modifying factors: Procedure  BP: (!) 147/92  HR: 82  Further details on both, my assessment(s), as well as the proposed treatment plan, please see below.  Of note patient underwent a right C4/5/6/7 facet medial branch nerve block on 12/15/2017 which improved her pain by approximately 50 to 60% for about 6 to 7 weeks.  Patient now notes worsening right neck pain that radiates into her shoulder and feels that she is due for a repeat right-sided cervical facet block.   Laboratory Chemistry  Inflammation Markers (CRP: Acute Phase) (ESR: Chronic Phase) No results found for: CRP, ESRSEDRATE, LATICACIDVEN                       Rheumatology Markers No results found for: RF, ANA, LABURIC, URICUR, LYMEIGGIGMAB, LYMEABIGMQN, HLAB27                      Renal Function Markers Lab Results  Component Value Date   BUN 10 04/16/2017   CREATININE 0.73 04/16/2017   BCR 14 04/16/2017   GFRAA 104 04/16/2017   GFRNONAA 90 04/16/2017  Hepatic Function Markers Lab Results  Component Value Date   AST 18 04/16/2017   ALT 12 04/16/2017   ALBUMIN 4.3 04/16/2017   ALKPHOS 99 04/16/2017                        Electrolytes Lab Results  Component Value Date   NA 144 04/16/2017   K 3.8 04/16/2017   CL 110 (H) 04/16/2017   CALCIUM 9.3 04/16/2017                        Neuropathy Markers Lab Results  Component  Value Date   HGBA1C 5.2 04/16/2017                        Bone Pathology Markers No results found for: [place], [id number], PJ8250NL9, [id number], 25OHVITD1, 25OHVITD2, 25OHVITD3, TESTOFREE, TESTOSTERONE                       Coagulation Parameters Lab Results  Component Value Date   PLT 297 04/16/2017                        Cardiovascular Markers Lab Results  Component Value Date   HGB 12.0 04/16/2017   HCT 35.1 04/16/2017                         CA Markers No results found for: CEA, CA125, LABCA2                      Note: Lab results reviewed.  Recent Diagnostic Imaging Review  Cervical Imaging:  Cervical CT w contrast:  Results for orders placed during the hospital encounter of 10/22/16  CT CERVICAL SPINE W CONTRAST   Narrative CLINICAL DATA:  Central posterior neck pain extending to the right shoulder. Numbness in the left hand. Low back pain and numbness extending into the lower extremities bilaterally, right greater than left.  FLUOROSCOPY TIME:  Radiation Exposure Index (as provided by the fluoroscopic device): 515.01 uGy*m2  If the device does not provide the exposure index:Fluoroscopy Time: 1 minutes 29 seconds  Number of Acquired Images:  23  PROCEDURE: LUMBAR PUNCTURE FOR CERVICAL AND LUMBAR MYELOGRAM  CERVICAL AND LUMBAR MYELOGRAM  CT CERVICAL MYELOGRAM  CT LUMBAR MYELOGRAM  After thorough discussion of risks and benefits of the procedure including bleeding, infection, injury to nerves, blood vessels, adjacent structures as well as headache and CSF leak, written and oral informed consent was obtained. Consent was obtained by Dr. San Morelle.  Patient was positioned prone on the fluoroscopy table. Local anesthesia was provided with 1% lidocaine without epinephrine after prepped and draped in the usual sterile fashion. Puncture was performed at L2-3 using a 3 1/2 inch 22-gauge spinal needle via left paramedian approach. Using a single  pass through the dura, the needle was placed within the thecal sac, with return of clear CSF. 10 mL of Isovue-M 300 was injected into the thecal sac, with normal opacification of the nerve roots and cauda equina consistent with free flow within the subarachnoid space. The patient was then moved to the trendelenburg position and contrast flowed into the Cervical spine region.  I personally performed the lumbar puncture and administered the intrathecal contrast. I also personally supervised acquisition of the myelogram images.  TECHNIQUE: Contiguous axial images were obtained through the Cervical and Lumbar  spine after the intrathecal infusion of infusion. Coronal and sagittal reconstructions were obtained of the axial image sets.  FINDINGS: CERVICAL AND LUMBAR MYELOGRAM FINDINGS:  A a broad-based disc protrusion is present at L5-S1 with subarticular narrowing bilaterally, left greater than right. There is early truncation of the L5 nerve roots, also worse on the left. The distal S1 nerve roots are normal. Grade 1 anterolisthesis is present at L4-5 with uncovering of a broad-based disc protrusion. Minimal subarticular narrowing is present. Mild disc bulging is present at L3-4.  There is no significant change in the anterolisthesis at L4-5 with standing, flexion, or extension. The disc protrusion at L3-4 is exaggerated with standing.  Contrast is poorly seen within the cervical spine. Fusion is solid. Cervical fusion is evident C3-4 through T1-2. AP alignment is present.  CT CERVICAL MYELOGRAM FINDINGS:  Cervical spine is imaged from the skullbase through T2-3.  Solid fusion at C3-4, C4-5, and C5-6 is again noted. There is lucency surrounding disc spacer at C6-7 without evidence of bridging bone. There is bridging bone across the C7-T1 level. The plate is separated from the T1 vertebral body anteriorly without significant interval change.  The soft tissues of the neck are  otherwise unremarkable.  C2-3: A leftward disc osteophyte complex and asymmetric facet spurring results in mild left central and moderate left foraminal stenosis.  C3-4: Left-sided uncovertebral and facet disease results in moderate low foraminal narrowing.  C4-5: Solid anterior fusion is present. No residual recurrent stenosis is evident.  C5-6: Solid anterior fusion is present. Mild facet hypertrophy is noted. There is no residual recurrent stenosis.  C6-7: There is no bridging bone. Lucency remains across the disc spacer. Facet hypertrophy and uncovertebral spurring contribute to moderate residual foraminal stenosis left greater than right.  C7-T1: There is bridging bone across the disc space. Facet hypertrophy contributes to mild foraminal narrowing bilaterally, left greater than right.  The lung apices are clear.  CT LUMBAR MYELOGRAM FINDINGS:  The lumbar spine is imaged from the midbody of T12 through S3-4. Slight anterolisthesis at L4-5 is reduced in the supine position. AP alignment is otherwise anatomic. Vertebral body heights are normal. Mild rightward curvature is centered at L1. There is leftward curvature at L5-S1.  Atherosclerotic calcifications are present within the aorta without aneurysm. No other focal lesions are present. There is no significant adenopathy.  L1-2:  Negative.  L2-3:  Negative.  L3-4: A broad-based disc protrusion is present. Moderate facet hypertrophy and ligamentum flavum thickening is present this results in mild subarticular narrowing bilaterally. The foramina are patent.  L4-5: A broad-based disc protrusion is present. Moderate facet hypertrophy and ligamentum flavum thickening is noted. Moderate subarticular narrowing is present bilaterally. Mild foraminal narrowing is present bilaterally.  L5-S1: There is chronic loss of disc height. A vacuum disc is present. A broad-based disc protrusion is present. Facet spurring is worse  on the right. Moderate left and mild right subarticular narrowing is present. Moderate foraminal stenosis is present bilaterally.  IMPRESSION: 1. Moderate left and mild right subarticular narrowing at L5-S1 with moderate foraminal stenosis bilaterally. 2. Moderate subarticular and mild foraminal stenosis bilaterally at L4-5. This is more evident on CT than myelogram. Anterolisthesis is present on the myelogram but reduced in the prone position on CT. 3. Broad-based disc protrusion with mild subarticular narrowing bilaterally at L3-4. 4. Solid fusion of the cervical spine at C3-4, C4-5, and C5-6. 5. Residual broad-based disc protrusion and facet hypertrophy with moderate foraminal narrowing bilaterally, left greater than right  at C6-7. 6. Solid fusion at C7-T1 with mild residual foraminal stenosis bilaterally due to facet spurring. 7. Leftward disc osteophyte complex and facet spurring at C2-3 with mild left subarticular and moderate left foraminal stenosis.   Electronically Signed   By: San Morelle M.D.   On: 10/22/2016 15:36     Results for orders placed during the hospital encounter of 12/20/12  DG Myelogram Cervical   Narrative *RADIOLOGY REPORT*  Clinical Data:  Recurrent neck pain.  Left upper extremity radiculitis.  The patient's symptoms initially resolved after spine surgery in 2011.  The current symptoms have occurred since.  MYELOGRAM INJECTION  Technique:  Informed consent was obtained from the patient prior to the procedure, including potential complications of headache, allergy, infection and pain.  A timeout procedure was performed. With the patient prone, the lower back was prepped with Betadine. 1% Lidocaine was used for local anesthesia.  Lumbar puncture was performed at the right paramidline L1-2 level using a 22 gauge needle with return of clear CSF.  10 ml of Omnipaque 300was injected into the subarachnoid space .  IMPRESSION: Successful  injection of  intrathecal contrast for myelography.  MYELOGRAM CERVICAL  Technique: I personally performed the lumbar puncture and administered the intrathecal contrast. I also personally supervised acquisition of the myelogram images. Following injection of intrathecal Omnipaque contrast, spine imaging in multiple projections was performed using fluoroscopy.  Fluoroscopy Time: 1 minute 37 seconds  Comparison:  MRI of the cervical spine 01/14/2010  Findings: The patient is now status post ACDF at C4-5 and C5-6. The fusion appears solid at these levels.  Disc osteophyte complex is evident at C6-7 with some blunting of the nerve roots bilaterally.  There is also disc osteophyte complex at C3-4 with right greater than left uncovertebral disease.  Anterolisthesis is present at to C3-4 upon standing.  This is exaggerated with flexion and partially reduced in extension.  More mild dynamic anterolisthesis is present at C2-3.  IMPRESSION:  1.  Status post anterior fusion at C4-5 and C5-6 without abnormal motion. 2.  Disc osteophyte complex at C6-7 with bilateral nerve root impact. 3.  Dynamic anterolisthesis at C3-4 and to a lesser extent at C2-3.  *RADIOLOGY REPORT*  CT MYELOGRAPHY CERVICAL SPINE  Technique:  CT imaging of the cervical spine was performed after intrathecal contrast administration. Multiplanar CT image reconstructions were also generated.  Findings:  The patient is status post ACDF at C4-5 and C5-6. Fusion is solid both levels.  Slight anterolisthesis is evident at C3-4.  Alignment is otherwise anatomic.  The soft tissues of the neck are unremarkable.  The lung apices are clear.  C2-3:  A broad-based disc osteophyte complex is asymmetric to the left.  Asymmetric left-sided uncovertebral spurring is noted as well.  Mild left foraminal narrowing is stable.  C3-4:  A broad-based disc osteophyte complex is present.  Bilateral facet hypertrophy has progressed,  left greater than right. Uncovertebral spurring has worsened as well.  Mild to moderate central and moderate foraminal stenosis is present bilaterally.  C4-5:  The patient is fused anteriorly.  No residual disc herniation or stenosis is evident.  C5-6:  The patient is fused anteriorly.  There is no residual stenosis.  C6-7:  A broad-based disc osteophyte complex is present.  Mild to moderate left and mild right foraminal narrowing is evident.  There is partial effacement of the ventral CSF.  C7-T1:  End plate spurring is evident.  There is chronic loss of disc height.  No  significant stenosis is present.  IMPRESSION:  1.  Status post ACDF at C4-5 and C5-6 without evidence for residual or recurrent stenosis. 2.  Mild to moderate left and mild right foraminal narrowing at C6- 7 secondary to a broad-based disc osteophyte complex and uncovertebral spurring. 3.  Progressive facet hypertrophy and uncovertebral disease at C3-4 with mild to moderate central and moderate foraminal stenosis bilaterally. 4.  Mild left foraminal narrowing at C2-3. 5.  Endplate spurring at Z6-X0 without significant stenosis.   Original Report Authenticated By: San Morelle, M.D.    Cervical DG Myelogram views:  Results for orders placed during the hospital encounter of 04/04/15  DG MYELOGRAPHY LUMBAR INJ CERVICAL   Narrative CLINICAL DATA:  Neck pain. Pain into the upper extremities. Tingling into the arms and bilateral hands, worse on the right. Symptoms were relieved by the most recent surgery, but have recurred.  FLUOROSCOPY TIME:  Fluoroscopy Time:  1 minutes 24 seconds  Number of Acquired Images:  12  PROCEDURE: LUMBAR PUNCTURE FOR CERVICAL MYELOGRAM  After thorough discussion of risks and benefits of the procedure including bleeding, infection, injury to nerves, blood vessels, adjacent structures as well as headache and CSF leak, written and oral informed consent was obtained.  Consent was obtained by Dr. San Morelle. We discussed the high likelihood of obtaining a diagnostic study.  Patient was positioned prone on the fluoroscopy table. Local anesthesia was provided with 1% lidocaine without epinephrine after prepped and draped in the usual sterile fashion. Puncture was performed at L3-4 using a 3 1/2 inch 22-gauge spinal needle via a left paramedian approach. Using a single pass through the dura, the needle was placed within the thecal sac, with return of clear CSF. 10 mL of Omnipaque-300 was injected into the thecal sac, with normal opacification of the nerve roots and cauda equina consistent with free flow within the subarachnoid space. The patient was then moved to the trendelenburg position and contrast flowed into the Cervical spine region.  I personally performed the lumbar puncture and administered the intrathecal contrast. I also personally supervised acquisition of the myelogram images.  TECHNIQUE: Contiguous axial images were obtained through the Cervical spine after the intrathecal infusion of infusion. Coronal and sagittal reconstructions were obtained of the axial image sets.  FINDINGS: CERVICAL MYELOGRAM FINDINGS:  The anterior fusion has been extended to include C3-4. No significant disc disease is evident at C2-3. A prominent disc osteophyte complex at C6-7 has progressed. There is no abnormal motion with flexion or extension.  CT CERVICAL MYELOGRAM FINDINGS:  The cervical spine is imaged from the skullbase through T3-4. Anterior fusion is solid at C3-4, C4-5, and C5-6. Vertebral body heights alignment are maintained.  C2-3: A mild leftward disc osteophyte complex and left-sided facet hypertrophy is similar to the prior study. There is no significant stenosis.  C3-4: Anterior fusion is now present. The central canal is decompressed. Mild left foraminal narrowing is improved. There is some uncovertebral and facet  disease. The posterior elements are now fused bilaterally.  C4-5: Anterior fusion is solid. No residual or recurrent stenosis is present.  C5-6: Anterior fusion is solid. No residual or recurrent stenosis is present.  C6-7: A broad-based disc osteophyte complex is present. There is partial effacement of ventral CSF. Moderate foraminal narrowing bilaterally demonstrate some progression, still worse left than right.  C7-T1: Central osteophytes are present without significant central canal stenosis. There is some uncovertebral disease without significant change or stenosis.  IMPRESSION: 1. Extension of anterior fusion to  include C3-4. The central canal is decompressed. Mild left foraminal narrowing is improved. 2. Solid anterior fusion at C4-5 and C5-6 without residual or recurrent stenosis. 3. Slight progression of broad-based disc osteophyte complex with partial effacement of the ventral CSF and moderate foraminal narrowing, left greater than right. 4. Mild central disc osteophyte and bilateral uncovertebral spurring at C7-T1 without significant stenosis.   Electronically Signed   By: San Morelle M.D.   On: 04/04/2015 13:27     Thoracic CT w contrast:  Results for orders placed during the hospital encounter of 03/10/16  CT THORACIC SPINE W CONTRAST   Narrative CLINICAL DATA:  Cervical spondylosis without myelopathy. Neck pain, mid to upper back pain. Pain extends to both shoulders. FLUOROSCOPY TIME:  50 seconds corresponding to a Dose Area Product of 170.4 Gy*m2 PROCEDURE: LUMBAR PUNCTURE FOR CERVICAL AND THORACIC MYELOGRAM After thorough discussion of risks and benefits of the procedure including bleeding, infection, injury to nerves, blood vessels, adjacent structures as well as headache and CSF leak, written and oral informed consent was obtained. Consent was obtained by Dr. Rolla Flatten. Patient was positioned prone on the fluoroscopy table.  Local anesthesia was provided with 1% lidocaine without epinephrine after prepped and draped in the usual sterile fashion. Puncture was performed at L3-L4 using a 3 1/2 inch 22-gauge spinal needle via midline approach. Using a single pass through the dura, the needle was placed within the thecal sac, with return of clear CSF. 10 mL of Isovue-M 300 was injected into the thecal sac, with normal opacification of the nerve roots and cauda equina consistent with free flow within the subarachnoid space. The patient was then moved to the trendelenburg position and contrast flowed into the Thoracic spine region. I personally performed the lumbar puncture and administered the intrathecal contrast. I also personally supervised acquisition of the myelogram images. TECHNIQUE: Contiguous axial images were obtained through the Cervical and Thoracic spine after the intrathecal infusion of infusion. Coronal and sagittal reconstructions were obtained of the axial image sets. FINDINGS: CERVICAL AND THORACIC MYELOGRAM FINDINGS: Cervical: There is poor opacification of the cervical subarachnoid space due to difficulty with transit of the bolus. There has been previous ACDF at C4-6 with subsequent C3-4 ACDF. All levels appears solid. No spinal stenosis or cord compression. Disc osteophyte complex at C7-T1. No abnormal motion with flexion extension. Thoracic: Ventral defects are seen at T5-6, T8-9, and T11-12. No thoracic compression fracture. No visible intradural mass lesion. Cord size normal throughout except for flattening at T8-9. CT CERVICAL MYELOGRAM FINDINGS: Alignment: Anatomic Vertebrae: Solid C3-C6 fusion. No malposition of hardware or fracture. Cord: No cord compression. Posterior Fossa: No tonsillar herniation. Vertebral Arteries: Not assessed. Paraspinal tissues: Atherosclerosis. Disc levels: The individual disc spaces were examined as follows: C2-3: LEFT-sided uncinate spurring and  facet arthropathy potentially affecting the LEFT C3 nerve. C3-4:  Solid fusion.  No impingement. C4-5:  Solid fusion.  No impingement. C5-6:  Solid fusion.  No impingement. C6-7: Central protrusion. BILATERAL foraminal narrowing due to uncinate spurring could affect either C7 nerve root, but worse on the LEFT. C7-T1: Disc osteophyte complex centrally. BILATERAL facet arthropathy. BILATERAL foraminal narrowing could affect either C8 nerve root. Compared to the prior myelogram in August 2016, similar appearance. CT THORACIC MYELOGRAM FINDINGS: Coarse trabecular markings suggesting osteopenia. No visible pulmonary mass or nodule. No mediastinal lesion. Platelike atelectasis LEFT base. No compression fracture. The individual disc spaces are examined as follows: T1-2:  Normal. T2-3:  Bulge.  No impingement. T3-4:  Bulge.  No impingement. T4-5:  Bulge.  No impingement. T5-6:  Leftward protrusion.  No definite impingement. T6-7:  Bulge.  No impingement. T7-8: Central protrusion. Slight effacement anterior subarachnoid space without significant stenosis or cord flattening. T8-9: Central extrusion. Moderate cord flattening. Correlate clinically for symptomatic myelopathy. T9-10:  Bulge.  No impingement. T10-11:  Unremarkable. T11-12:  Central and rightward protrusion.  No definite impingement. T12-L1: Bulge.  No impingement. IMPRESSION: Solid C3 through C6 ACDF. Moderate adjacent segment disease at C6-7 and C7-T1, but stable from August 2016. No dynamic instability. Multilevel thoracic degenerative disc disease, with the most significant abnormality at T8-9 where a central disc extrusion results in moderate cord flattening. Electronically Signed   By: Staci Righter M.D.   On: 03/10/2016 14:44    Lumbar CT w/wo contrast: No results found for this or any previous visit. Lumbar CT w contrast:  Results for orders placed during the hospital encounter of 10/22/16  CT LUMBAR SPINE W  CONTRAST   Narrative CLINICAL DATA:  Central posterior neck pain extending to the right shoulder. Numbness in the left hand. Low back pain and numbness extending into the lower extremities bilaterally, right greater than left.  FLUOROSCOPY TIME:  Radiation Exposure Index (as provided by the fluoroscopic device): 515.01 uGy*m2  If the device does not provide the exposure index:Fluoroscopy Time: 1 minutes 29 seconds  Number of Acquired Images:  23  PROCEDURE: LUMBAR PUNCTURE FOR CERVICAL AND LUMBAR MYELOGRAM  CERVICAL AND LUMBAR MYELOGRAM  CT CERVICAL MYELOGRAM  CT LUMBAR MYELOGRAM  After thorough discussion of risks and benefits of the procedure including bleeding, infection, injury to nerves, blood vessels, adjacent structures as well as headache and CSF leak, written and oral informed consent was obtained. Consent was obtained by Dr. San Morelle.  Patient was positioned prone on the fluoroscopy table. Local anesthesia was provided with 1% lidocaine without epinephrine after prepped and draped in the usual sterile fashion. Puncture was performed at L2-3 using a 3 1/2 inch 22-gauge spinal needle via left paramedian approach. Using a single pass through the dura, the needle was placed within the thecal sac, with return of clear CSF. 10 mL of Isovue-M 300 was injected into the thecal sac, with normal opacification of the nerve roots and cauda equina consistent with free flow within the subarachnoid space. The patient was then moved to the trendelenburg position and contrast flowed into the Cervical spine region.  I personally performed the lumbar puncture and administered the intrathecal contrast. I also personally supervised acquisition of the myelogram images.  TECHNIQUE: Contiguous axial images were obtained through the Cervical and Lumbar spine after the intrathecal infusion of infusion. Coronal and sagittal reconstructions were obtained of the axial image  sets.  FINDINGS: CERVICAL AND LUMBAR MYELOGRAM FINDINGS:  A a broad-based disc protrusion is present at L5-S1 with subarticular narrowing bilaterally, left greater than right. There is early truncation of the L5 nerve roots, also worse on the left. The distal S1 nerve roots are normal. Grade 1 anterolisthesis is present at L4-5 with uncovering of a broad-based disc protrusion. Minimal subarticular narrowing is present. Mild disc bulging is present at L3-4.  There is no significant change in the anterolisthesis at L4-5 with standing, flexion, or extension. The disc protrusion at L3-4 is exaggerated with standing.  Contrast is poorly seen within the cervical spine. Fusion is solid. Cervical fusion is evident C3-4 through T1-2. AP alignment is present.  CT CERVICAL MYELOGRAM FINDINGS:  Cervical spine is imaged from the skullbase  through T2-3.  Solid fusion at C3-4, C4-5, and C5-6 is again noted. There is lucency surrounding disc spacer at C6-7 without evidence of bridging bone. There is bridging bone across the C7-T1 level. The plate is separated from the T1 vertebral body anteriorly without significant interval change.  The soft tissues of the neck are otherwise unremarkable.  C2-3: A leftward disc osteophyte complex and asymmetric facet spurring results in mild left central and moderate left foraminal stenosis.  C3-4: Left-sided uncovertebral and facet disease results in moderate low foraminal narrowing.  C4-5: Solid anterior fusion is present. No residual recurrent stenosis is evident.  C5-6: Solid anterior fusion is present. Mild facet hypertrophy is noted. There is no residual recurrent stenosis.  C6-7: There is no bridging bone. Lucency remains across the disc spacer. Facet hypertrophy and uncovertebral spurring contribute to moderate residual foraminal stenosis left greater than right.  C7-T1: There is bridging bone across the disc space. Facet hypertrophy  contributes to mild foraminal narrowing bilaterally, left greater than right.  The lung apices are clear.  CT LUMBAR MYELOGRAM FINDINGS:  The lumbar spine is imaged from the midbody of T12 through S3-4. Slight anterolisthesis at L4-5 is reduced in the supine position. AP alignment is otherwise anatomic. Vertebral body heights are normal. Mild rightward curvature is centered at L1. There is leftward curvature at L5-S1.  Atherosclerotic calcifications are present within the aorta without aneurysm. No other focal lesions are present. There is no significant adenopathy.  L1-2:  Negative.  L2-3:  Negative.  L3-4: A broad-based disc protrusion is present. Moderate facet hypertrophy and ligamentum flavum thickening is present this results in mild subarticular narrowing bilaterally. The foramina are patent.  L4-5: A broad-based disc protrusion is present. Moderate facet hypertrophy and ligamentum flavum thickening is noted. Moderate subarticular narrowing is present bilaterally. Mild foraminal narrowing is present bilaterally.  L5-S1: There is chronic loss of disc height. A vacuum disc is present. A broad-based disc protrusion is present. Facet spurring is worse on the right. Moderate left and mild right subarticular narrowing is present. Moderate foraminal stenosis is present bilaterally.  IMPRESSION: 1. Moderate left and mild right subarticular narrowing at L5-S1 with moderate foraminal stenosis bilaterally. 2. Moderate subarticular and mild foraminal stenosis bilaterally at L4-5. This is more evident on CT than myelogram. Anterolisthesis is present on the myelogram but reduced in the prone position on CT. 3. Broad-based disc protrusion with mild subarticular narrowing bilaterally at L3-4. 4. Solid fusion of the cervical spine at C3-4, C4-5, and C5-6. 5. Residual broad-based disc protrusion and facet hypertrophy with moderate foraminal narrowing bilaterally, left greater than  right at C6-7. 6. Solid fusion at C7-T1 with mild residual foraminal stenosis bilaterally due to facet spurring. 7. Leftward disc osteophyte complex and facet spurring at C2-3 with mild left subarticular and moderate left foraminal stenosis.   Electronically Signed   By: San Morelle M.D.   On: 10/22/2016 15:36     Lumbar DG Myelogram:  Results for orders placed during the hospital encounter of 04/04/15  DG MYELOGRAPHY LUMBAR INJ CERVICAL   Narrative CLINICAL DATA:  Neck pain. Pain into the upper extremities. Tingling into the arms and bilateral hands, worse on the right. Symptoms were relieved by the most recent surgery, but have recurred.  FLUOROSCOPY TIME:  Fluoroscopy Time:  1 minutes 24 seconds  Number of Acquired Images:  12  PROCEDURE: LUMBAR PUNCTURE FOR CERVICAL MYELOGRAM  After thorough discussion of risks and benefits of the procedure including bleeding, infection, injury  to nerves, blood vessels, adjacent structures as well as headache and CSF leak, written and oral informed consent was obtained. Consent was obtained by Dr. San Morelle. We discussed the high likelihood of obtaining a diagnostic study.  Patient was positioned prone on the fluoroscopy table. Local anesthesia was provided with 1% lidocaine without epinephrine after prepped and draped in the usual sterile fashion. Puncture was performed at L3-4 using a 3 1/2 inch 22-gauge spinal needle via a left paramedian approach. Using a single pass through the dura, the needle was placed within the thecal sac, with return of clear CSF. 10 mL of Omnipaque-300 was injected into the thecal sac, with normal opacification of the nerve roots and cauda equina consistent with free flow within the subarachnoid space. The patient was then moved to the trendelenburg position and contrast flowed into the Cervical spine region.  I personally performed the lumbar puncture and administered the intrathecal  contrast. I also personally supervised acquisition of the myelogram images.  TECHNIQUE: Contiguous axial images were obtained through the Cervical spine after the intrathecal infusion of infusion. Coronal and sagittal reconstructions were obtained of the axial image sets.  FINDINGS: CERVICAL MYELOGRAM FINDINGS:  The anterior fusion has been extended to include C3-4. No significant disc disease is evident at C2-3. A prominent disc osteophyte complex at C6-7 has progressed. There is no abnormal motion with flexion or extension.  CT CERVICAL MYELOGRAM FINDINGS:  The cervical spine is imaged from the skullbase through T3-4. Anterior fusion is solid at C3-4, C4-5, and C5-6. Vertebral body heights alignment are maintained.  C2-3: A mild leftward disc osteophyte complex and left-sided facet hypertrophy is similar to the prior study. There is no significant stenosis.  C3-4: Anterior fusion is now present. The central canal is decompressed. Mild left foraminal narrowing is improved. There is some uncovertebral and facet disease. The posterior elements are now fused bilaterally.  C4-5: Anterior fusion is solid. No residual or recurrent stenosis is present.  C5-6: Anterior fusion is solid. No residual or recurrent stenosis is present.  C6-7: A broad-based disc osteophyte complex is present. There is partial effacement of ventral CSF. Moderate foraminal narrowing bilaterally demonstrate some progression, still worse left than right.  C7-T1: Central osteophytes are present without significant central canal stenosis. There is some uncovertebral disease without significant change or stenosis.  IMPRESSION: 1. Extension of anterior fusion to include C3-4. The central canal is decompressed. Mild left foraminal narrowing is improved. 2. Solid anterior fusion at C4-5 and C5-6 without residual or recurrent stenosis. 3. Slight progression of broad-based disc osteophyte complex  with partial effacement of the ventral CSF and moderate foraminal narrowing, left greater than right. 4. Mild central disc osteophyte and bilateral uncovertebral spurring at C7-T1 without significant stenosis.   Electronically Signed   By: San Morelle M.D.   On: 04/04/2015 13:27    Lumbar DG Myelogram:  Results for orders placed during the hospital encounter of 10/22/16  DG MYELOGRAPHY LUMBAR INJ [place]   Narrative CLINICAL DATA:  Central posterior neck pain extending to the right shoulder. Numbness in the left hand. Low back pain and numbness extending into the lower extremities bilaterally, right greater than left.  FLUOROSCOPY TIME:  Radiation Exposure Index (as provided by the fluoroscopic device): 515.01 uGy*m2  If the device does not provide the exposure index:Fluoroscopy Time: 1 minutes 29 seconds  Number of Acquired Images:  23  PROCEDURE: LUMBAR PUNCTURE FOR CERVICAL AND LUMBAR MYELOGRAM  CERVICAL AND LUMBAR MYELOGRAM  CT CERVICAL  MYELOGRAM  CT LUMBAR MYELOGRAM  After thorough discussion of risks and benefits of the procedure including bleeding, infection, injury to nerves, blood vessels, adjacent structures as well as headache and CSF leak, written and oral informed consent was obtained. Consent was obtained by Dr. San Morelle.  Patient was positioned prone on the fluoroscopy table. Local anesthesia was provided with 1% lidocaine without epinephrine after prepped and draped in the usual sterile fashion. Puncture was performed at L2-3 using a 3 1/2 inch 22-gauge spinal needle via left paramedian approach. Using a single pass through the dura, the needle was placed within the thecal sac, with return of clear CSF. 10 mL of Isovue-M 300 was injected into the thecal sac, with normal opacification of the nerve roots and cauda equina consistent with free flow within the subarachnoid space. The patient was then moved to the trendelenburg  position and contrast flowed into the Cervical spine region.  I personally performed the lumbar puncture and administered the intrathecal contrast. I also personally supervised acquisition of the myelogram images.  TECHNIQUE: Contiguous axial images were obtained through the Cervical and Lumbar spine after the intrathecal infusion of infusion. Coronal and sagittal reconstructions were obtained of the axial image sets.  FINDINGS: CERVICAL AND LUMBAR MYELOGRAM FINDINGS:  A a broad-based disc protrusion is present at L5-S1 with subarticular narrowing bilaterally, left greater than right. There is early truncation of the L5 nerve roots, also worse on the left. The distal S1 nerve roots are normal. Grade 1 anterolisthesis is present at L4-5 with uncovering of a broad-based disc protrusion. Minimal subarticular narrowing is present. Mild disc bulging is present at L3-4.  There is no significant change in the anterolisthesis at L4-5 with standing, flexion, or extension. The disc protrusion at L3-4 is exaggerated with standing.  Contrast is poorly seen within the cervical spine. Fusion is solid. Cervical fusion is evident C3-4 through T1-2. AP alignment is present.  CT CERVICAL MYELOGRAM FINDINGS:  Cervical spine is imaged from the skullbase through T2-3.  Solid fusion at C3-4, C4-5, and C5-6 is again noted. There is lucency surrounding disc spacer at C6-7 without evidence of bridging bone. There is bridging bone across the C7-T1 level. The plate is separated from the T1 vertebral body anteriorly without significant interval change.  The soft tissues of the neck are otherwise unremarkable.  C2-3: A leftward disc osteophyte complex and asymmetric facet spurring results in mild left central and moderate left foraminal stenosis.  C3-4: Left-sided uncovertebral and facet disease results in moderate low foraminal narrowing.  C4-5: Solid anterior fusion is present. No residual  recurrent stenosis is evident.  C5-6: Solid anterior fusion is present. Mild facet hypertrophy is noted. There is no residual recurrent stenosis.  C6-7: There is no bridging bone. Lucency remains across the disc spacer. Facet hypertrophy and uncovertebral spurring contribute to moderate residual foraminal stenosis left greater than right.  C7-T1: There is bridging bone across the disc space. Facet hypertrophy contributes to mild foraminal narrowing bilaterally, left greater than right.  The lung apices are clear.  CT LUMBAR MYELOGRAM FINDINGS:  The lumbar spine is imaged from the midbody of T12 through S3-4. Slight anterolisthesis at L4-5 is reduced in the supine position. AP alignment is otherwise anatomic. Vertebral body heights are normal. Mild rightward curvature is centered at L1. There is leftward curvature at L5-S1.  Atherosclerotic calcifications are present within the aorta without aneurysm. No other focal lesions are present. There is no significant adenopathy.  L1-2:  Negative.  L2-3:  Negative.  L3-4: A broad-based disc protrusion is present. Moderate facet hypertrophy and ligamentum flavum thickening is present this results in mild subarticular narrowing bilaterally. The foramina are patent.  L4-5: A broad-based disc protrusion is present. Moderate facet hypertrophy and ligamentum flavum thickening is noted. Moderate subarticular narrowing is present bilaterally. Mild foraminal narrowing is present bilaterally.  L5-S1: There is chronic loss of disc height. A vacuum disc is present. A broad-based disc protrusion is present. Facet spurring is worse on the right. Moderate left and mild right subarticular narrowing is present. Moderate foraminal stenosis is present bilaterally.  IMPRESSION: 1. Moderate left and mild right subarticular narrowing at L5-S1 with moderate foraminal stenosis bilaterally. 2. Moderate subarticular and mild foraminal stenosis  bilaterally at L4-5. This is more evident on CT than myelogram. Anterolisthesis is present on the myelogram but reduced in the prone position on CT. 3. Broad-based disc protrusion with mild subarticular narrowing bilaterally at L3-4. 4. Solid fusion of the cervical spine at C3-4, C4-5, and C5-6. 5. Residual broad-based disc protrusion and facet hypertrophy with moderate foraminal narrowing bilaterally, left greater than right at C6-7. 6. Solid fusion at C7-T1 with mild residual foraminal stenosis bilaterally due to facet spurring. 7. Leftward disc osteophyte complex and facet spurring at C2-3 with mild left subarticular and moderate left foraminal stenosis.   Electronically Signed   By: San Morelle M.D.   On: 10/22/2016 15:36     Lumbar DG Epidurogram OP:  Results for orders placed during the hospital encounter of 04/10/10  DG Epidurography   Narrative EPIDUROGRAM S+I   Clinical Data: Displacement of cervical intervertebral disc without myelopathy.   Procedure: Right C7-T1 Cervical Interlaminar Epidural Steroid Injection   Fully informed written consent was obtained on the first visit. The patient was placed prone on the fluoroscopic table, the C7-T1 interspace was visualized, and the skin was marked.  After thorough povidone-iodine scrubbing, the site was draped in sterile fashion. 1% lidocaine was used to anesthetize the skin and deeper soft tissues.  A 20-gauge Crawford epidural needle was inserted and advanced into the epidural space using loss of resistance technique with normal saline.  The epidural space was identified on first pass without evidence of blood, cerebrospinal fluid, or paresthesias.  Needle tip placement within the epidural space was confirmed using 1 mL of non-ionic contrast.  There was no intravascular or subarachnoid opacification.  Next, 60 mg of Kenalog was administered and flushed using 2 mL of normal saline. The needle was restyletted  and withdrawn.  The patient tolerated the procedure well and there was no evidence of procedural complication.   The patient was able to ambulate to the recovery area and observed for an appropriate length of time as outlined on the nursing notes. The patient was discharged home with a driver in good condition with detailed patient instructions.   Fluoroscopy time:  24 seconds   IMPRESSION:   Satisfactory interlaminar cervical epidural steroid injection, or right C7-T1.  Provider: Alfredo Batty    Spine Imaging: Whole Spine DG Myelogram views:  Results for orders placed during the hospital encounter of 10/22/16  DG [place]   Narrative CLINICAL DATA:  Central posterior neck pain extending to the right shoulder. Numbness in the left hand. Low back pain and numbness extending into the lower extremities bilaterally, right greater than left.  FLUOROSCOPY TIME:  Radiation Exposure Index (as provided by the fluoroscopic device): 515.01 uGy*m2  If the device does not provide the exposure index:Fluoroscopy  Time: 1 minutes 29 seconds  Number of Acquired Images:  23  PROCEDURE: LUMBAR PUNCTURE FOR CERVICAL AND LUMBAR MYELOGRAM  CERVICAL AND LUMBAR MYELOGRAM  CT CERVICAL MYELOGRAM  CT LUMBAR MYELOGRAM  After thorough discussion of risks and benefits of the procedure including bleeding, infection, injury to nerves, blood vessels, adjacent structures as well as headache and CSF leak, written and oral informed consent was obtained. Consent was obtained by Dr. San Morelle.  Patient was positioned prone on the fluoroscopy table. Local anesthesia was provided with 1% lidocaine without epinephrine after prepped and draped in the usual sterile fashion. Puncture was performed at L2-3 using a 3 1/2 inch 22-gauge spinal needle via left paramedian approach. Using a single pass through the dura, the needle was placed within the thecal sac, with  return of clear CSF. 10 mL of Isovue-M 300 was injected into the thecal sac, with normal opacification of the nerve roots and cauda equina consistent with free flow within the subarachnoid space. The patient was then moved to the trendelenburg position and contrast flowed into the Cervical spine region.  I personally performed the lumbar puncture and administered the intrathecal contrast. I also personally supervised acquisition of the myelogram images.  TECHNIQUE: Contiguous axial images were obtained through the Cervical and Lumbar spine after the intrathecal infusion of infusion. Coronal and sagittal reconstructions were obtained of the axial image sets.  FINDINGS: CERVICAL AND LUMBAR MYELOGRAM FINDINGS:  A a broad-based disc protrusion is present at L5-S1 with subarticular narrowing bilaterally, left greater than right. There is early truncation of the L5 nerve roots, also worse on the left. The distal S1 nerve roots are normal. Grade 1 anterolisthesis is present at L4-5 with uncovering of a broad-based disc protrusion. Minimal subarticular narrowing is present. Mild disc bulging is present at L3-4.  There is no significant change in the anterolisthesis at L4-5 with standing, flexion, or extension. The disc protrusion at L3-4 is exaggerated with standing.  Contrast is poorly seen within the cervical spine. Fusion is solid. Cervical fusion is evident C3-4 through T1-2. AP alignment is present.  CT CERVICAL MYELOGRAM FINDINGS:  Cervical spine is imaged from the skullbase through T2-3.  Solid fusion at C3-4, C4-5, and C5-6 is again noted. There is lucency surrounding disc spacer at C6-7 without evidence of bridging bone. There is bridging bone across the C7-T1 level. The plate is separated from the T1 vertebral body anteriorly without significant interval change.  The soft tissues of the neck are otherwise unremarkable.  C2-3: A leftward disc osteophyte complex and  asymmetric facet spurring results in mild left central and moderate left foraminal stenosis.  C3-4: Left-sided uncovertebral and facet disease results in moderate low foraminal narrowing.  C4-5: Solid anterior fusion is present. No residual recurrent stenosis is evident.  C5-6: Solid anterior fusion is present. Mild facet hypertrophy is noted. There is no residual recurrent stenosis.  C6-7: There is no bridging bone. Lucency remains across the disc spacer. Facet hypertrophy and uncovertebral spurring contribute to moderate residual foraminal stenosis left greater than right.  C7-T1: There is bridging bone across the disc space. Facet hypertrophy contributes to mild foraminal narrowing bilaterally, left greater than right.  The lung apices are clear.  CT LUMBAR MYELOGRAM FINDINGS:  The lumbar spine is imaged from the midbody of T12 through S3-4. Slight anterolisthesis at L4-5 is reduced in the supine position. AP alignment is otherwise anatomic. Vertebral body heights are normal. Mild rightward curvature is centered at L1. There is leftward curvature  at L5-S1.  Atherosclerotic calcifications are present within the aorta without aneurysm. No other focal lesions are present. There is no significant adenopathy.  L1-2:  Negative.  L2-3:  Negative.  L3-4: A broad-based disc protrusion is present. Moderate facet hypertrophy and ligamentum flavum thickening is present this results in mild subarticular narrowing bilaterally. The foramina are patent.  L4-5: A broad-based disc protrusion is present. Moderate facet hypertrophy and ligamentum flavum thickening is noted. Moderate subarticular narrowing is present bilaterally. Mild foraminal narrowing is present bilaterally.  L5-S1: There is chronic loss of disc height. A vacuum disc is present. A broad-based disc protrusion is present. Facet spurring is worse on the right. Moderate left and mild right subarticular narrowing is  present. Moderate foraminal stenosis is present bilaterally.  IMPRESSION: 1. Moderate left and mild right subarticular narrowing at L5-S1 with moderate foraminal stenosis bilaterally. 2. Moderate subarticular and mild foraminal stenosis bilaterally at L4-5. This is more evident on CT than myelogram. Anterolisthesis is present on the myelogram but reduced in the prone position on CT. 3. Broad-based disc protrusion with mild subarticular narrowing bilaterally at L3-4. 4. Solid fusion of the cervical spine at C3-4, C4-5, and C5-6. 5. Residual broad-based disc protrusion and facet hypertrophy with moderate foraminal narrowing bilaterally, left greater than right at C6-7. 6. Solid fusion at C7-T1 with mild residual foraminal stenosis bilaterally due to facet spurring. 7. Leftward disc osteophyte complex and facet spurring at C2-3 with mild left subarticular and moderate left foraminal stenosis.   Electronically Signed   By: San Morelle M.D.   On: 10/22/2016 15:36     Epidurography 1:  Results for orders placed during the hospital encounter of 04/10/10  DG Epidurography   Narrative EPIDUROGRAM S+I   Clinical Data: Displacement of cervical intervertebral disc without myelopathy.   Procedure: Right C7-T1 Cervical Interlaminar Epidural Steroid Injection   Fully informed written consent was obtained on the first visit. The patient was placed prone on the fluoroscopic table, the C7-T1 interspace was visualized, and the skin was marked.  After thorough povidone-iodine scrubbing, the site was draped in sterile fashion. 1% lidocaine was used to anesthetize the skin and deeper soft tissues.  A 20-gauge Crawford epidural needle was inserted and advanced into the epidural space using loss of resistance technique with normal saline.  The epidural space was identified on first pass without evidence of blood, cerebrospinal fluid, or paresthesias.  Needle tip placement within the  epidural space was confirmed using 1 mL of non-ionic contrast.  There was no intravascular or subarachnoid opacification.  Next, 60 mg of Kenalog was administered and flushed using 2 mL of normal saline. The needle was restyletted and withdrawn.  The patient tolerated the procedure well and there was no evidence of procedural complication.   The patient was able to ambulate to the recovery area and observed for an appropriate length of time as outlined on the nursing notes. The patient was discharged home with a driver in good condition with detailed patient instructions.   Fluoroscopy time:  24 seconds   IMPRESSION:   Satisfactory interlaminar cervical epidural steroid injection, or right C7-T1.  Provider: Alfredo Batty    Complexity Note: Imaging results reviewed. Results shared with Ms. Roda Shutters, using Layman's terms.                         Meds   Current Outpatient Medications:  .  atorvastatin (LIPITOR) 40 MG tablet, TAKE 1 TABLET BY MOUTH ONCE  DAILY, Disp: 90 tablet, Rfl: 3 .  b complex vitamins tablet, Take 1 tablet by mouth daily. B12, Disp: , Rfl:  .  busPIRone (BUSPAR) 15 MG tablet, Take 1 tablet (15 mg total) by mouth 3 (three) times daily., Disp: 90 tablet, Rfl: 0 .  diclofenac sodium (VOLTAREN) 1 % GEL, Apply topically 4 (four) times daily., Disp: , Rfl:  .  fluocinonide ointment (LIDEX) 5.09 %, Apply 1 application topically 2 (two) times daily., Disp: , Rfl:  .  FOLIC ACID PO, Take 10 mg tablet twice a day., Disp: , Rfl:  .  guaiFENesin (MUCINEX) 600 MG 12 hr tablet, Take 1,200 mg by mouth 2 (two) times daily., Disp: , Rfl:  .  ibuprofen (ADVIL,MOTRIN) 200 MG tablet, Take 200 mg by mouth every 6 (six) hours as needed. For pain, Disp: , Rfl:  .  meloxicam (MOBIC) 7.5 MG tablet, Take 7.5 mg by mouth daily., Disp: , Rfl:  .  methocarbamol (ROBAXIN) 500 MG tablet, Take 500 mg by mouth 4 (four) times daily., Disp: , Rfl:  .  Multiple Vitamin (MULTIVITAMIN) tablet,  Take 1 tablet by mouth daily.  , Disp: , Rfl:  .  Nutritional Supplements (ESTROVEN ENERGY PO), Take by mouth daily., Disp: , Rfl:  .  omeprazole (PRILOSEC) 20 MG capsule, Take 20 mg by mouth daily., Disp: , Rfl:  .  pravastatin (PRAVACHOL) 40 MG tablet, Take 40 mg by mouth daily., Disp: , Rfl:  .  sertraline (ZOLOFT) 100 MG tablet, TAKE 1 TABLET BY MOUTH ONCE DAILY, Disp: 90 tablet, Rfl: 1 .  topiramate (TOPAMAX) 25 MG tablet, 25 mg qhs x 2 weeks and then increase to 25 mg BID, Disp: 60 tablet, Rfl: 2 .  traZODone (DESYREL) 50 MG tablet, TAKE 1 TO 2 TABLETS BY MOUTH AT BEDTIME, Disp: 180 tablet, Rfl: 1 .  valACYclovir (VALTREX) 1000 MG tablet, TAKE TWO TABLETS BY MOUTH TWICE DAILY FOR 5 DAYS AS NEEDED, Disp: 40 tablet, Rfl: 3 .  Venlafaxine HCl 75 MG TB24, TAKE 1 TABLET BY MOUTH THREE TIMES DAILY (Patient taking differently: TAKE 3 TABLET BY MOUTH DAILY), Disp: 270 tablet, Rfl: 3 .  Methotrexate, PF, 25 MG/0.5ML SOAJ, 0.71m once weekly, Disp: 0.5 mL, Rfl: 11  Current Facility-Administered Medications:  .  ipratropium-albuterol (DUONEB) 0.5-2.5 (3) MG/3ML nebulizer solution 3 mL, 3 mL, Nebulization, Once, Pollak, Adriana M, PA-C  ROS  Constitutional: Denies any fever or chills Gastrointestinal: No reported hemesis, hematochezia, vomiting, or acute GI distress Musculoskeletal: Denies any acute onset joint swelling, redness, loss of ROM, or weakness Neurological: No reported episodes of acute onset apraxia, aphasia, dysarthria, agnosia, amnesia, paralysis, loss of coordination, or loss of consciousness  Allergies  Ms. CLecountis allergic to plaquenil [hydroxychloroquine].  [place] Drug: Ms. CZellers reports that she does not use drugs. Alcohol:  reports that she does not drink alcohol. Tobacco:  reports that she has been smoking.  She has a 20.00 pack-year smoking history. She has never used smokeless tobacco. Medical:  has a past medical history of Alcohol abuse, Anemia, Arthritis,  Cervicalgia, [place], Depression, GERD (gastroesophageal reflux disease), Heart murmur, Other and unspecified hyperlipidemia, and Tachycardia. Surgical: Ms. COboyle has a past surgical history that includes neck disc surgery; Breast enhancement surgery (1981); Esophagogastroduodenoscopy; Tonsillectomy and adenoidectomy (1973 ); Carpal tunnel release (11/11/2011); Carpal tunnel release (2013); Rotator cuff repair (06/16/2016); Rotator cuff repair; and Colonoscopy with propofol (N/A, 11/01/2017). Family: family history includes Alcohol abuse in  her father and mother; Bipolar disorder in her mother; Pancreatic cancer in her father; Suicidality in her mother.  Constitutional Exam  General appearance: Well nourished, well developed, and well hydrated. In no apparent acute distress Vitals:   02/21/18 1402  BP: (!) 147/92  Pulse: 82  Resp: 18  Temp: 98.3 F (36.8 C)  TempSrc: Oral  SpO2: 97%  Weight: 145 lb (65.8 kg)  Height: _0  (1.753 m)   BMI Assessment: Estimated body mass index is 21.41 kg/m as calculated from the following:   Height as of this encounter: _1  (1.753 m).   Weight as of this encounter: 145 lb (65.8 kg).  BMI interpretation table: BMI level Category Range association with higher incidence of chronic pain  <18 kg/m2 Underweight   18.5-24.9 kg/m2 Ideal body weight   25-29.9 kg/m2 Overweight Increased incidence by 20%  30-34.9 kg/m2 Obese (Class I) Increased incidence by 68%  35-39.9 kg/m2 Severe obesity (Class II) Increased incidence by 136%  >40 kg/m2 Extreme obesity (Class III) Increased incidence by 254%   Patient's current BMI Ideal Body weight  Body mass index is 21.41 kg/m. Ideal body weight: 66.2 kg (145 lb 15.1 oz)   BMI Readings from Last 4 Encounters:  02/21/18 21.41 kg/m  01/03/18 21.41 kg/m  12/15/17 21.41 kg/m  11/24/17 21.27 kg/m   Wt Readings from Last 4 Encounters:  02/21/18 145 lb (65.8 kg)   01/03/18 145 lb (65.8 kg)  12/15/17 145 lb (65.8 kg)  11/24/17 144 lb (65.3 kg)  Psych/Mental status: Alert, oriented x 3 (person, place, & time)       Eyes: PERLA Respiratory: No evidence of acute respiratory distress   Cervical Spine Area Exam  Skin & Axial Inspection: Well healed scar from previous spine surgery detected Alignment: Asymmetric Functional ROM: Decreased ROM, to the right Stability: No instability detected Muscle Tone/Strength: Functionally intact. No obvious neuro-muscular anomalies detected. Sensory (Neurological): Musculoskeletal pain pattern on the right Palpation: Complains of area being tender to palpation Positive provocative maneuver for for cervical facet disease on the right  Upper Extremity (UE) Exam    Side: Right upper extremity  Side: Left upper extremity  Skin & Extremity Inspection: Skin color, temperature, and hair growth are WNL. No peripheral edema or cyanosis. No masses, redness, swelling, asymmetry, or associated skin lesions. No contractures.  Skin & Extremity Inspection: Skin color, temperature, and hair growth are WNL. No peripheral edema or cyanosis. No masses, redness, swelling, asymmetry, or associated skin lesions. No contractures.  Functional ROM: Unrestricted ROM          Functional ROM: Unrestricted ROM          Muscle Tone/Strength: Functionally intact. No obvious neuro-muscular anomalies detected.  Muscle Tone/Strength: Functionally intact. No obvious neuro-muscular anomalies detected.  Sensory (Neurological): Unimpaired          Sensory (Neurological): Unimpaired          Palpation: No palpable anomalies              Palpation: No palpable anomalies              Provocative Test(s):  Phalen's test: deferred Tinel's test: deferred Apley's scratch test (touch opposite shoulder):  Action 1 (Across chest): deferred Action 2 (Overhead): deferred Action 3 (LB reach): deferred   Provocative Test(s):  Phalen's test: deferred Tinel's test:  deferred Apley's scratch test (touch opposite shoulder):  Action 1 (Across chest): deferred Action 2 (Overhead): deferred Action 3 (LB reach): deferred  Thoracic Spine Area Exam  Skin & Axial Inspection: No masses, redness, or swelling Alignment: Symmetrical Functional ROM: Unrestricted ROM Stability: No instability detected Muscle Tone/Strength: Functionally intact. No obvious neuro-muscular anomalies detected. Sensory (Neurological): Unimpaired Muscle strength & Tone: No palpable anomalies  Lumbar Spine Area Exam  Skin & Axial Inspection: No masses, redness, or swelling Alignment: Symmetrical Functional ROM: Unrestricted ROM       Stability: No instability detected Muscle Tone/Strength: Functionally intact. No obvious neuro-muscular anomalies detected. Sensory (Neurological): Unimpaired Palpation: No palpable anomalies       Provocative Tests: Lumbar Hyperextension/rotation test: deferred today       Lumbar quadrant test (Kemp's test): deferred today       Lumbar Lateral bending test: deferred today       Patrick's Maneuver: deferred today                   FABER test: deferred today       Thigh-thrust test: deferred today       S-I compression test: deferred today       S-I distraction test: deferred today        Gait & Posture Assessment  Ambulation: Unassisted Gait: Relatively normal for age and body habitus Posture: WNL   Lower Extremity Exam    Side: Right lower extremity  Side: Left lower extremity  Stability: No instability observed          Stability: No instability observed          Skin & Extremity Inspection: Skin color, temperature, and hair growth are WNL. No peripheral edema or cyanosis. No masses, redness, swelling, asymmetry, or associated skin lesions. No contractures.  Skin & Extremity Inspection: Skin color, temperature, and hair growth are WNL. No peripheral edema or cyanosis. No masses, redness, swelling, asymmetry, or associated skin lesions. No  contractures.  Functional ROM: Unrestricted ROM                  Functional ROM: Unrestricted ROM                  Muscle Tone/Strength: Functionally intact. No obvious neuro-muscular anomalies detected.  Muscle Tone/Strength: Functionally intact. No obvious neuro-muscular anomalies detected.  Sensory (Neurological): Unimpaired  Sensory (Neurological): Unimpaired  Palpation: No palpable anomalies  Palpation: No palpable anomalies   Assessment  Primary Diagnosis & Pertinent Problem List: The primary encounter diagnosis was Spondylosis of cervical region without myelopathy or radiculopathy. Diagnoses of DDD (degenerative disc disease), cervical, Cervical fusion syndrome, Chronic pain syndrome, and Cervicalgia were also pertinent to this visit.  Status Diagnosis  Worsening Persistent Persistent 1. Spondylosis of cervical region without myelopathy or radiculopathy   2. DDD (degenerative disc disease), cervical   3. Cervical fusion syndrome   4. Chronic pain syndrome   5. Cervicalgia     General Recommendations: The pain condition that the patient suffers from is best treated with a multidisciplinary approach that involves an increase in physical activity to prevent de-conditioning and worsening of the pain cycle, as well as psychological counseling (formal and/or informal) to address the co-morbid psychological affects of pain. Treatment will often involve judicious use of pain medications and interventional procedures to decrease the pain, allowing the patient to participate in the physical activity that will ultimately produce long-lasting pain reductions. The goal of the multidisciplinary approach is to return the patient to a higher level of overall function and to restore their ability to perform activities of daily living.  62 year old female with  a primary complaint of neck pain that radiates down into bilateral hands. Patient's past medical history significant for rheumatoid  arthritis,cervical radiculopathy and fusion with extensive cervical spine surgery hx as follow:2012 C3-4 fusion, 2014 C4-6 fusion and 2017 C6-T1 fusion.Patient is also had an EMG completed which shows length dependent peripheral neuropathy. Patient also has carpal tunnel syndrome status post carpal tunnel surgery 2015. Patient has previously tried Flexeril, ibuprofen, meloxicam which are only somewhat helpful. She takes Zoloft 100 mg daily. Patient also takes gabapentin 1200 mg nightly. Patient is status post right C7-T1 epidural steroid injection done on 10/27/2017 which was not beneficial for her neck and arm symptoms.  Patient did have significant benefit after right C4, 5, 6, 7 facet medial branch nerve block performed on 12/15/2017 but states that the block has worn off and is requesting repeat block.  This is reasonable.  We will proceed with right C4, 5, 6, 7 for cervical facet medial branch nerve block #2.  Plan: -repeat right C4, 5, 6, 7 cervical facet medial branch nerve block, #2 -Consideration of occipital nerve blocks for occipital neuralgia. -Continue gabapentin and Effexor as previously prescribed  Lab-work, procedure(s), and/or referral(s): Orders Placed This Encounter  Procedures  . CERVICAL FACET (MEDIAL BRANCH NERVE BLOCK)     Provider-requested follow-up: Return in about 1 week (around 02/28/2018) for Procedure.  Time Note: Greater than 50% of the 25 minute(s) of face-to-face time spent with Ms. Zullo, was spent in counseling/coordination of care regarding: the appropriate use of the pain scale, Ms. Zandi's primary cause of pain, the treatment plan, treatment alternatives, the risks and possible complications of proposed treatment, going over the informed consent, realistic expectations and the goals of pain management (increased in functionality).  Future Appointments  Date Time Provider [place]  03/02/2018  9:00 AM [name], MD [facility] None     Primary Care Physician: Birdie Sons, MD Location: [facility] Facility Note by: [name], M.D Date: 02/21/2018; Time: 4:25 PM  Patient Instructions  ____________________________________________________________________________________________  Preparing for Procedure with Sedation  Instructions: . Oral Intake: Do not eat or drink anything for at least 8 hours prior to your procedure. . Transportation: Public transportation is not allowed. Bring an adult driver. The driver must be physically present in our waiting room before any procedure can be started. Marland Kitchen Physical Assistance: Bring an adult physically capable of assisting you, in the event you need help. This adult should keep you company at home for at least 6 hours after the procedure. . Blood Pressure Medicine: Take your blood pressure medicine with a sip of water the morning of the procedure. . Blood thinners:  . Diabetics on insulin: Notify the staff so that you can be scheduled 1st case in the morning. If your diabetes requires high dose insulin, take only  of your normal insulin dose the morning of the procedure and notify the staff that you have done so. . Preventing infections: Shower with an antibacterial soap the morning of your procedure. . Build-up your immune system: Take 1000 mg of Vitamin C with every meal (3 times a day) the day prior to your procedure. Marland Kitchen Antibiotics: Inform the staff if you have a condition or reason that requires you to take antibiotics before dental procedures. . Pregnancy: If you are pregnant, call and cancel the procedure. . Sickness: If you have a cold, fever, or any active infections, call and cancel the procedure. . Arrival: You must be in the facility at least  30 minutes prior to your scheduled procedure. . Children: Do not bring children with you. . Dress appropriately: Bring dark clothing that you would not mind if they get stained. . Valuables: Do not bring any  jewelry or valuables.  Procedure appointments are reserved for interventional treatments only. Marland Kitchen No Prescription Refills. . No medication changes will be discussed during procedure appointments. . No disability issues will be discussed.  Remember:  Regular Business hours are:  Monday to Thursday 8:00 AM to 4:00 PM  Provider's Schedule: Milinda Pointer, MD:  Procedure days: Tuesday and Thursday 7:30 AM to 4:00 PM  [name], MD:  Procedure days: Monday and Wednesday 7:30 AM to 4:00 PM ____________________________________________________________________________________________   Facet Blocks Patient Information  Description: The facets are joints in the spine between the vertebrae.  Like any joints in the body, facets can become irritated and painful.  Arthritis can also effect the facets.  By injecting steroids and local anesthetic in and around these joints, we can temporarily block the nerve supply to them.  Steroids act directly on irritated nerves and tissues to reduce selling and inflammation which often leads to decreased pain.  Facet blocks may be done anywhere along the spine from the neck to the low back depending upon the location of your pain.   After numbing the skin with local anesthetic (like Novocaine), a small needle is passed onto the facet joints under x-ray guidance.  You may experience a sensation of pressure while this is being done.  The entire block usually lasts about 15-25 minutes.   Conditions which may be treated by facet blocks:   Low back/buttock pain  Neck/shoulder pain  Certain types of headaches  Preparation for the injection:  1. Do not eat any solid food or dairy products within 8 hours of your appointment. 2. You may drink clear liquid up to 3 hours before appointment.  Clear liquids include water, black coffee, juice or soda.  No milk or cream please. 3. You may take your regular medication, including pain medications, with a sip of water  before your appointment.  Diabetics should hold regular insulin (if taken separately) and take 1/2 normal NPH dose the morning of the procedure.  Carry some sugar containing items with you to your appointment. 4. A driver must accompany you and be prepared to drive you home after your procedure. 5. Bring all your current medications with you. 6. An IV may be inserted and sedation may be given at the discretion of the physician. 7. A blood pressure cuff, EKG and other monitors will often be applied during the procedure.  Some patients may need to have extra oxygen administered for a short period. 8. You will be asked to provide medical information, including your allergies and medications, prior to the procedure.  We must know immediately if you are taking blood thinners (like Coumadin/Warfarin) or if you are allergic to IV iodine contrast (dye).  We must know if you could possible be pregnant.  Possible side-effects:   Bleeding from needle site  Infection (rare, may require surgery)  Nerve injury (rare)  Numbness & tingling (temporary)  Difficulty urinating (rare, temporary)  Spinal headache (a headache worse with upright posture)  Light-headedness (temporary)  Pain at injection site (serveral days)  Decreased blood pressure (rare, temporary)  Weakness in arm/leg (temporary)  Pressure sensation in back/neck (temporary)   Call if you experience:   Fever/chills associated with headache or increased back/neck pain  Headache worsened by an upright position  New onset, weakness or numbness of an extremity below the injection site  Hives or difficulty breathing (go to the emergency room)  Inflammation or drainage at the injection site(s)  Severe back/neck pain greater than usual  New symptoms which are concerning to you  Please note:  Although the local anesthetic injected can often make your back or neck feel good for several hours after the injection, the pain will  likely return. It takes 3-7 days for steroids to work.  You may not notice any pain relief for at least one week.  If effective, we will often do a series of 2-3 injections spaced 3-6 weeks apart to maximally decrease your pain.  After the initial series, you may be a candidate for a more permanent nerve block of the facets.  If you have any questions, please call #[phone]) [facility]

## 2018-02-21 NOTE — Patient Instructions (Signed)
____________________________________________________________________________________________  Preparing for Procedure with Sedation  Instructions: . Oral Intake: Do not eat or drink anything for at least 8 hours prior to your procedure. . Transportation: Public transportation is not allowed. Bring an adult driver. The driver must be physically present in our waiting room before any procedure can be started. Marland Kitchen Physical Assistance: Bring an adult physically capable of assisting you, in the event you need help. This adult should keep you company at home for at least 6 hours after the procedure. . Blood Pressure Medicine: Take your blood pressure medicine with a sip of water the morning of the procedure. . Blood thinners:  . Diabetics on insulin: Notify the staff so that you can be scheduled 1st case in the morning. If your diabetes requires high dose insulin, take only  of your normal insulin dose the morning of the procedure and notify the staff that you have done so. . Preventing infections: Shower with an antibacterial soap the morning of your procedure. . Build-up your immune system: Take 1000 mg of Vitamin C with every meal (3 times a day) the day prior to your procedure. Marland Kitchen Antibiotics: Inform the staff if you have a condition or reason that requires you to take antibiotics before dental procedures. . Pregnancy: If you are pregnant, call and cancel the procedure. . Sickness: If you have a cold, fever, or any active infections, call and cancel the procedure. . Arrival: You must be in the facility at least 30 minutes prior to your scheduled procedure. . Children: Do not bring children with you. . Dress appropriately: Bring dark clothing that you would not mind if they get stained. . Valuables: Do not bring any jewelry or valuables.  Procedure appointments are reserved for interventional treatments only. Marland Kitchen No Prescription Refills. . No medication changes will be discussed during procedure  appointments. . No disability issues will be discussed.  Remember:  Regular Business hours are:  Monday to Thursday 8:00 AM to 4:00 PM  Provider's Schedule: Milinda Pointer, MD:  Procedure days: Tuesday and Thursday 7:30 AM to 4:00 PM  [name], MD:  Procedure days: Monday and Wednesday 7:30 AM to 4:00 PM ____________________________________________________________________________________________   Facet Blocks Patient Information  Description: The facets are joints in the spine between the vertebrae.  Like any joints in the body, facets can become irritated and painful.  Arthritis can also effect the facets.  By injecting steroids and local anesthetic in and around these joints, we can temporarily block the nerve supply to them.  Steroids act directly on irritated nerves and tissues to reduce selling and inflammation which often leads to decreased pain.  Facet blocks may be done anywhere along the spine from the neck to the low back depending upon the location of your pain.   After numbing the skin with local anesthetic (like Novocaine), a small needle is passed onto the facet joints under x-ray guidance.  You may experience a sensation of pressure while this is being done.  The entire block usually lasts about 15-25 minutes.   Conditions which may be treated by facet blocks:   Low back/buttock pain  Neck/shoulder pain  Certain types of headaches  Preparation for the injection:  1. Do not eat any solid food or dairy products within 8 hours of your appointment. 2. You may drink clear liquid up to 3 hours before appointment.  Clear liquids include water, black coffee, juice or soda.  No milk or cream please. 3. You may take your regular medication,  including pain medications, with a sip of water before your appointment.  Diabetics should hold regular insulin (if taken separately) and take 1/2 normal NPH dose the morning of the procedure.  Carry some sugar containing items with  you to your appointment. 4. A driver must accompany you and be prepared to drive you home after your procedure. 5. Bring all your current medications with you. 6. An IV may be inserted and sedation may be given at the discretion of the physician. 7. A blood pressure cuff, EKG and other monitors will often be applied during the procedure.  Some patients may need to have extra oxygen administered for a short period. 8. You will be asked to provide medical information, including your allergies and medications, prior to the procedure.  We must know immediately if you are taking blood thinners (like Coumadin/Warfarin) or if you are allergic to IV iodine contrast (dye).  We must know if you could possible be pregnant.  Possible side-effects:   Bleeding from needle site  Infection (rare, may require surgery)  Nerve injury (rare)  Numbness & tingling (temporary)  Difficulty urinating (rare, temporary)  Spinal headache (a headache worse with upright posture)  Light-headedness (temporary)  Pain at injection site (serveral days)  Decreased blood pressure (rare, temporary)  Weakness in arm/leg (temporary)  Pressure sensation in back/neck (temporary)   Call if you experience:   Fever/chills associated with headache or increased back/neck pain  Headache worsened by an upright position  New onset, weakness or numbness of an extremity below the injection site  Hives or difficulty breathing (go to the emergency room)  Inflammation or drainage at the injection site(s)  Severe back/neck pain greater than usual  New symptoms which are concerning to you  Please note:  Although the local anesthetic injected can often make your back or neck feel good for several hours after the injection, the pain will likely return. It takes 3-7 days for steroids to work.  You may not notice any pain relief for at least one week.  If effective, we will often do a series of 2-3 injections spaced 3-6  weeks apart to maximally decrease your pain.  After the initial series, you may be a candidate for a more permanent nerve block of the facets.  If you have any questions, please call #[phone]) [facility]

## 2018-02-25 ENCOUNTER — Encounter: Payer: Self-pay | Admitting: Physician Assistant

## 2018-02-25 MED ORDER — GABAPENTIN 400 MG PO CAPS
1200.00 | ORAL_CAPSULE | ORAL | Status: DC
Start: 2018-02-26 — End: 2018-02-25

## 2018-02-25 MED ORDER — TRAZODONE HCL 100 MG PO TABS
100.00 | ORAL_TABLET | ORAL | Status: DC
Start: ? — End: 2018-02-25

## 2018-02-25 MED ORDER — GUAIFENESIN 400 MG PO TABS
400.00 | ORAL_TABLET | ORAL | Status: DC
Start: ? — End: 2018-02-25

## 2018-02-25 MED ORDER — POLYETHYLENE GLYCOL 3350 17 G PO PACK
17.00 | PACK | ORAL | Status: DC
Start: ? — End: 2018-02-25

## 2018-02-25 MED ORDER — BUSPIRONE HCL 15 MG PO TABS
15.00 | ORAL_TABLET | ORAL | Status: DC
Start: 2018-02-26 — End: 2018-02-25

## 2018-02-25 MED ORDER — VALACYCLOVIR HCL 500 MG PO TABS
1000.00 | ORAL_TABLET | ORAL | Status: DC
Start: 2018-02-25 — End: 2018-02-25

## 2018-02-25 MED ORDER — DICLOFENAC SODIUM 1 % TD GEL
2.00 | TRANSDERMAL | Status: DC
Start: 2018-02-25 — End: 2018-02-25

## 2018-02-25 MED ORDER — IBUPROFEN 200 MG PO TABS
400.00 | ORAL_TABLET | ORAL | Status: DC
Start: 2018-02-25 — End: 2018-02-25

## 2018-02-25 MED ORDER — LEFLUNOMIDE 10 MG PO TABS
10.00 | ORAL_TABLET | ORAL | Status: DC
Start: 2018-02-26 — End: 2018-02-25

## 2018-02-25 MED ORDER — ATORVASTATIN CALCIUM 40 MG PO TABS
40.00 | ORAL_TABLET | ORAL | Status: DC
Start: 2018-02-26 — End: 2018-02-25

## 2018-02-25 MED ORDER — CYCLOBENZAPRINE HCL 10 MG PO TABS
10.00 | ORAL_TABLET | ORAL | Status: DC
Start: 2018-02-25 — End: 2018-02-25

## 2018-02-25 MED ORDER — TOPIRAMATE 25 MG PO TABS
25.00 | ORAL_TABLET | ORAL | Status: DC
Start: 2018-02-25 — End: 2018-02-25

## 2018-02-25 MED ORDER — ALBUTEROL SULFATE HFA 108 (90 BASE) MCG/ACT IN AERS
2.00 | INHALATION_SPRAY | RESPIRATORY_TRACT | Status: DC
Start: ? — End: 2018-02-25

## 2018-02-25 MED ORDER — ACETAMINOPHEN 500 MG PO TABS
1000.00 | ORAL_TABLET | ORAL | Status: DC
Start: 2018-02-25 — End: 2018-02-25

## 2018-02-25 MED ORDER — SERTRALINE HCL 100 MG PO TABS
100.00 | ORAL_TABLET | ORAL | Status: DC
Start: 2018-02-26 — End: 2018-02-25

## 2018-02-25 MED ORDER — DOCUSATE SODIUM 100 MG PO CAPS
100.00 | ORAL_CAPSULE | ORAL | Status: DC
Start: 2018-02-26 — End: 2018-02-25

## 2018-02-25 MED ORDER — GENERIC EXTERNAL MEDICATION
Status: DC
Start: ? — End: 2018-02-25

## 2018-02-25 MED ORDER — ENOXAPARIN SODIUM 30 MG/0.3ML ~~LOC~~ SOLN
30.00 | SUBCUTANEOUS | Status: DC
Start: 2018-02-25 — End: 2018-02-25

## 2018-02-25 MED ORDER — NICOTINE 21 MG/24HR TD PT24
1.00 | MEDICATED_PATCH | TRANSDERMAL | Status: DC
Start: ? — End: 2018-02-25

## 2018-02-25 MED ORDER — ONDANSETRON HCL 4 MG/2ML IJ SOLN
4.00 | INTRAMUSCULAR | Status: DC
Start: ? — End: 2018-02-25

## 2018-02-25 MED ORDER — PANTOPRAZOLE SODIUM 20 MG PO TBEC
20.00 | DELAYED_RELEASE_TABLET | ORAL | Status: DC
Start: 2018-02-26 — End: 2018-02-25

## 2018-02-25 MED ORDER — LIDOCAINE 5 % EX PTCH
3.00 | MEDICATED_PATCH | CUTANEOUS | Status: DC
Start: 2018-02-26 — End: 2018-02-25

## 2018-02-25 MED ORDER — VENLAFAXINE HCL 75 MG PO TABS
75.00 | ORAL_TABLET | ORAL | Status: DC
Start: 2018-02-25 — End: 2018-02-25

## 2018-02-25 MED ORDER — GABAPENTIN 300 MG PO CAPS
600.00 | ORAL_CAPSULE | ORAL | Status: DC
Start: 2018-02-25 — End: 2018-02-25

## 2018-03-02 ENCOUNTER — Ambulatory Visit: Payer: BLUE CROSS/BLUE SHIELD | Admitting: Student in an Organized Health Care Education/Training Program

## 2018-03-04 ENCOUNTER — Encounter: Payer: Self-pay | Admitting: Physician Assistant

## 2018-03-04 ENCOUNTER — Ambulatory Visit (INDEPENDENT_AMBULATORY_CARE_PROVIDER_SITE_OTHER): Payer: BLUE CROSS/BLUE SHIELD | Admitting: Physician Assistant

## 2018-03-04 VITALS — BP 110/80 | HR 83 | Temp 98.0°F | Resp 16 | Wt 144.8 lb

## 2018-03-04 DIAGNOSIS — F3341 Major depressive disorder, recurrent, in partial remission: Secondary | ICD-10-CM

## 2018-03-04 DIAGNOSIS — F411 Generalized anxiety disorder: Secondary | ICD-10-CM

## 2018-03-04 DIAGNOSIS — S2242XD Multiple fractures of ribs, left side, subsequent encounter for fracture with routine healing: Secondary | ICD-10-CM | POA: Diagnosis not present

## 2018-03-04 DIAGNOSIS — W19XXXD Unspecified fall, subsequent encounter: Secondary | ICD-10-CM

## 2018-03-04 DIAGNOSIS — S62102D Fracture of unspecified carpal bone, left wrist, subsequent encounter for fracture with routine healing: Secondary | ICD-10-CM

## 2018-03-04 DIAGNOSIS — F321 Major depressive disorder, single episode, moderate: Secondary | ICD-10-CM | POA: Diagnosis not present

## 2018-03-04 DIAGNOSIS — F5101 Primary insomnia: Secondary | ICD-10-CM

## 2018-03-04 DIAGNOSIS — R339 Retention of urine, unspecified: Secondary | ICD-10-CM

## 2018-03-04 DIAGNOSIS — B001 Herpesviral vesicular dermatitis: Secondary | ICD-10-CM

## 2018-03-04 MED ORDER — TRAZODONE HCL 50 MG PO TABS: 50.0000 mg | ORAL_TABLET | Freq: Every day | ORAL | 1 refills | Status: DC

## 2018-03-04 MED ORDER — POTASSIUM CHLORIDE CRYS ER 20 MEQ PO TBCR: 20.0000 meq | EXTENDED_RELEASE_TABLET | Freq: Every day | ORAL | 3 refills | Status: DC

## 2018-03-04 MED ORDER — BUSPIRONE HCL 15 MG PO TABS: 15.0000 mg | ORAL_TABLET | Freq: Three times a day (TID) | ORAL | 1 refills | Status: DC

## 2018-03-04 MED ORDER — FUROSEMIDE 20 MG PO TABS: 20.0000 mg | ORAL_TABLET | Freq: Every day | ORAL | 3 refills | Status: DC | PRN

## 2018-03-04 MED ORDER — VALACYCLOVIR HCL 1 G PO TABS: ORAL_TABLET | ORAL | 3 refills | Status: DC

## 2018-03-04 MED ORDER — SERTRALINE HCL 100 MG PO TABS: 100.0000 mg | ORAL_TABLET | Freq: Every day | ORAL | 1 refills | Status: DC

## 2018-03-04 NOTE — Progress Notes (Signed)
Patient: Gabrielle Pineda Female    DOB: [birth year]   62 y.o.   MRN: [id number] Visit Date: 03/04/2018  Today's Provider: [name], PA-C   No chief complaint on file.  Subjective:    HPI  Follow Up ER Visit  Patient is here for ER follow up.  She was recently seen at [facility] for fall on 02/23/2018. She was later transferred to [facility]. Treatment for this included X-ray of the left wrist showed Minimally posteriorly displaced impacted left distal radius and ulna fractures with extension of the distal radius fracture into the radiocarpal joint. Osteoarthrosis of the first carpometacarpal joint and scaphotrapezial joint.  X-ray of the Ribs Fractures of the left posterolateral 8th through 10th ribs. No pneumothorax. Widening of the right [facility] joint with partial resorption of the distal right clavicle  She reports good compliance with treatment. She reports this condition is Improved. She reports she saw the Orthopedic physician yesterday. She was put in a hard cast for her left wrist fracture and sling. ------------------------------------------------------------------------------------     Allergies  Allergen Reactions  . Plaquenil [Hydroxychloroquine] Rash     Current Outpatient Medications:  .  atorvastatin (LIPITOR) 40 MG tablet, TAKE 1 TABLET BY MOUTH ONCE DAILY, Disp: 90 tablet, Rfl: 3 .  b complex vitamins tablet, Take 1 tablet by mouth daily. B12, Disp: , Rfl:  .  busPIRone (BUSPAR) 15 MG tablet, Take 1 tablet (15 mg total) by mouth 3 (three) times daily., Disp: 90 tablet, Rfl: 0 .  cyclobenzaprine (FLEXERIL) 10 MG tablet, Take by mouth., Disp: , Rfl:  .  diclofenac sodium (VOLTAREN) 1 % GEL, Apply topically 4 (four) times daily., Disp: , Rfl:  .  fluocinonide ointment (LIDEX) 4.40 %, Apply 1 application topically 2 (two) times daily., Disp: , Rfl:  .  FOLIC ACID PO, Take 10 mg tablet twice a day., Disp: , Rfl:  .  guaiFENesin  (MUCINEX) 600 MG 12 hr tablet, Take 1,200 mg by mouth 2 (two) times daily., Disp: , Rfl:  .  ibuprofen (ADVIL,MOTRIN) 200 MG tablet, Take 200 mg by mouth every 6 (six) hours as needed. For pain, Disp: , Rfl:  .  meloxicam (MOBIC) 7.5 MG tablet, Take 7.5 mg by mouth daily., Disp: , Rfl:  .  Multiple Vitamin (MULTIVITAMIN) tablet, Take 1 tablet by mouth daily.  , Disp: , Rfl:  .  Nutritional Supplements (ESTROVEN ENERGY PO), Take by mouth daily., Disp: , Rfl:  .  omeprazole (PRILOSEC) 20 MG capsule, Take 20 mg by mouth daily., Disp: , Rfl:  .  oxyCODONE (OXY IR/ROXICODONE) 5 MG immediate release tablet, TAKE 1 TABLET BY MOUTH EVERY 4 HOURS AS NEEDED FOR PAIN FORUP TO 5 DAYS, Disp: , Rfl: 0 .  sertraline (ZOLOFT) 100 MG tablet, TAKE 1 TABLET BY MOUTH ONCE DAILY, Disp: 90 tablet, Rfl: 1 .  topiramate (TOPAMAX) 25 MG tablet, 25 mg qhs x 2 weeks and then increase to 25 mg BID, Disp: 60 tablet, Rfl: 2 .  traZODone (DESYREL) 50 MG tablet, TAKE 1 TO 2 TABLETS BY MOUTH AT BEDTIME, Disp: 180 tablet, Rfl: 1 .  valACYclovir (VALTREX) 1000 MG tablet, TAKE TWO TABLETS BY MOUTH TWICE DAILY FOR 5 DAYS AS NEEDED, Disp: 40 tablet, Rfl: 3 .  Venlafaxine HCl 75 MG TB24, TAKE 1 TABLET BY MOUTH THREE TIMES DAILY (Patient taking differently: TAKE 3 TABLET BY MOUTH DAILY), Disp: 270 tablet, Rfl: 3 .  Methotrexate, PF, 25 MG/0.5ML  SOAJ, 0.69mL once weekly, Disp: 0.5 mL, Rfl: 11  Current Facility-Administered Medications:  .  ipratropium-albuterol (DUONEB) 0.5-2.5 (3) MG/3ML nebulizer solution 3 mL, 3 mL, Nebulization, Once, Pollak, Adriana M, PA-C  Review of Systems  Constitutional: Negative.   Respiratory: Negative.   Cardiovascular: Negative.   Gastrointestinal: Negative.   Musculoskeletal: Positive for arthralgias, gait problem, joint swelling, myalgias and neck stiffness.  Neurological: Positive for weakness and numbness.    Social History   Tobacco Use  . Smoking status: Current Every Day Smoker     Packs/day: 0.50    Years: 40.00    Pack years: 20.00  . Smokeless tobacco: Never Used  Substance Use Topics  . Alcohol use: No    Comment: recovering alcoholic Since 5956    Objective:   BP 110/80 (BP Location: Right Arm, Patient Position: Sitting, Cuff Size: Large)   Pulse 83   Temp 98 F (36.7 C) (Oral)   Resp 16   Wt 144 lb 12.8 oz (65.7 kg)   BMI 21.38 kg/m  Vitals:   03/04/18 1541  BP: 110/80  Pulse: 83  Resp: 16  Temp: 98 F (36.7 C)  TempSrc: Oral  Weight: 144 lb 12.8 oz (65.7 kg)     Physical Exam  Constitutional: She appears well-developed and well-nourished. No distress.  Neck: Neck supple. Decreased range of motion present.  Cardiovascular: Normal rate, regular rhythm and normal heart sounds. Exam reveals no gallop and no friction rub.  No murmur heard. Pulmonary/Chest: Effort normal and breath sounds normal. No respiratory distress. She has no wheezes. She has no rales. She exhibits tenderness.    Musculoskeletal:       Left wrist: She exhibits decreased range of motion (in hard cast).  Skin: She is not diaphoretic.  Vitals reviewed.       Assessment & Plan:     1. Fall, subsequent encounter Doing well. Fall precautions discussed.  2. Closed fracture of left wrist with routine healing, subsequent encounter Followed by Ortho. In cast currently. Has f/u in 1-2 weeks.  3. Closed fracture of multiple ribs of left side with routine healing, subsequent encounter Doing well. Discussed deep breathing exercises to prevent pneumonia.   4. GAD (generalized anxiety disorder) Stable. Diagnosis pulled for medication refill. Continue current medical treatment plan. - busPIRone (BUSPAR) 15 MG tablet; Take 1 tablet (15 mg total) by mouth 3 (three) times daily.  Dispense: 270 tablet; Refill: 1  5. Depression, major, single episode, moderate (HCC) Stable. Diagnosis pulled for medication refill. Continue current medical treatment plan. - sertraline (ZOLOFT) 100  MG tablet; Take 1 tablet (100 mg total) by mouth daily.  Dispense: 90 tablet; Refill: 1 - busPIRone (BUSPAR) 15 MG tablet; Take 1 tablet (15 mg total) by mouth 3 (three) times daily.  Dispense: 270 tablet; Refill: 1  6. Primary insomnia Stable. Diagnosis pulled for medication refill. Continue current medical treatment plan. - traZODone (DESYREL) 50 MG tablet; Take 1-2 tablets (50-100 mg total) by mouth at bedtime.  Dispense: 180 tablet; Refill: 1  7. Cold sore Stable. Diagnosis pulled for medication refill. Continue current medical treatment plan. - valACYclovir (VALTREX) 1000 MG tablet; TAKE TWO TABLETS BY MOUTH TWICE DAILY FOR 5 DAYS AS NEEDED  Dispense: 180 tablet; Refill: 3  8. Urinary retention Stable. Diagnosis pulled for medication refill. Continue current medical treatment plan.  - furosemide (LASIX) 20 MG tablet; Take 1 tablet (20 mg total) by mouth daily as needed for fluid.  Dispense: 30 tablet; Refill: 3 -  potassium chloride SA (K-DUR,KLOR-CON) 20 MEQ tablet; Take 1 tablet (20 mEq total) by mouth daily. Take with furosemide  Dispense: 30 tablet; Refill: [place], PA-C  [facility]

## 2018-03-10 ENCOUNTER — Encounter: Payer: Self-pay | Admitting: Physician Assistant

## 2018-03-11 ENCOUNTER — Encounter: Payer: Self-pay | Admitting: Physician Assistant

## 2018-03-11 NOTE — Telephone Encounter (Signed)
Gabrielle Pineda,  Can you refax [place] form?  Thanks.

## 2018-03-14 ENCOUNTER — Encounter: Payer: Self-pay | Admitting: Student in an Organized Health Care Education/Training Program

## 2018-03-14 NOTE — Telephone Encounter (Signed)
Form and Letter refaxed.  Thanks,  -Joseline

## 2018-03-15 ENCOUNTER — Encounter: Payer: Self-pay | Admitting: Physician Assistant

## 2018-03-18 ENCOUNTER — Telehealth: Payer: Self-pay | Admitting: Physician Assistant

## 2018-03-18 NOTE — Telephone Encounter (Signed)
Patient scheduled     08/07

## 2018-03-18 NOTE — Telephone Encounter (Signed)
I received a note from [facility] stating they want her to get an [place] of her gallbladder, MRCP, some labs and referral.   Does she want to come see me so we can order these things for her or has she already started to get these things with Dr. Vira Agar?

## 2018-03-22 ENCOUNTER — Other Ambulatory Visit: Payer: Self-pay | Admitting: Physician Assistant

## 2018-03-22 ENCOUNTER — Ambulatory Visit: Payer: BLUE CROSS/BLUE SHIELD

## 2018-03-22 ENCOUNTER — Ambulatory Visit
Admission: RE | Admit: 2018-03-22 | Discharge: 2018-03-22 | Disposition: A | Discharge status: Home / Self Care | Payer: BLUE CROSS/BLUE SHIELD | Source: Ambulatory Visit | Attending: Physician Assistant | Admitting: Physician Assistant

## 2018-03-22 DIAGNOSIS — Z1231 Encounter for screening mammogram for malignant neoplasm of breast: Secondary | ICD-10-CM | POA: Insufficient documentation

## 2018-03-22 DIAGNOSIS — Z1239 Encounter for other screening for malignant neoplasm of breast: Secondary | ICD-10-CM

## 2018-03-23 ENCOUNTER — Encounter: Payer: Self-pay | Admitting: Physician Assistant

## 2018-03-23 ENCOUNTER — Ambulatory Visit (INDEPENDENT_AMBULATORY_CARE_PROVIDER_SITE_OTHER): Payer: BLUE CROSS/BLUE SHIELD | Admitting: Physician Assistant

## 2018-03-23 ENCOUNTER — Telehealth: Payer: Self-pay

## 2018-03-23 VITALS — BP 140/82 | HR 80 | Temp 97.9°F | Resp 16 | Wt 138.6 lb

## 2018-03-23 DIAGNOSIS — Z800 Family history of malignant neoplasm of digestive organs: Secondary | ICD-10-CM | POA: Diagnosis not present

## 2018-03-23 DIAGNOSIS — R935 Abnormal findings on diagnostic imaging of other abdominal regions, including retroperitoneum: Secondary | ICD-10-CM

## 2018-03-23 DIAGNOSIS — R1012 Left upper quadrant pain: Secondary | ICD-10-CM | POA: Diagnosis not present

## 2018-03-23 DIAGNOSIS — K8689 Other specified diseases of pancreas: Secondary | ICD-10-CM

## 2018-03-23 NOTE — Telephone Encounter (Signed)
-----   Message from [name], [place] sent at 03/22/2018  5:14 PM EDT ----- Normal mammogram. Repeat screening in one year.

## 2018-03-23 NOTE — Progress Notes (Signed)
Patient: Gabrielle Pineda Female    DOB: August 25, 1955   62 y.o.   MRN: [id number] Visit Date: 03/23/2018  Today's Provider: [name], PA-C   Chief Complaint  Patient presents with  . Follow-up   Subjective:    HPI Patient here today to discuss a note that we received from [facility] stating they want her to get an [place] of her gallbladder, MRCP, some labs and referral if MRCP is positive. She has a CT abd/pelvis due to chronic abdominal pain, what she had felt was chronic left rib pain from her rib fracture.   EXAM: CT ABDOMEN PELVIS W CONTRAST DATE: 02/25/2018 9:50 PM ACCESSION: [id number] UN DICTATED: 02/25/2018 10:15 PM INTERPRETATION LOCATION: [place]  CLINICAL INDICATION: trauma  COMPARISON: Concurrent CTA chest  TECHNIQUE: A spiral CT scan of the abdomen and pelvis was obtained with IV contrast from the lung bases through the pubic symphysis. Images were reconstructed in the axial plane. Coronal and sagittal reformatted images were also provided for further evaluation.  FINDINGS:   LOWER CHEST: Please see separately dictated report for concurrent CTA chest.  ABDOMEN:  HEPATOBILIARY: Unremarkable liver. Mild intrahepatic biliary ductal dilatation. The common bile duct is dilated to 1 cm. No obstructing stone or lesion is seen. Gallbladder is unremarkable. PANCREAS: Pancreatic duct is dilated to 6 mm in the region of the pancreatic head. SPLEEN: Unremarkable. ADRENAL GLANDS: Unremarkable. KIDNEYS/URETERS: Unremarkable. BLADDER: Unremarkable. BOWEL/PERITONEUM/RETROPERITONEUM: No bowel obstruction. No acute inflammatory process. No free fluid or free air. VASCULATURE: Normal caliber abdominal aorta. Patent portal vasculature. Unremarkable inferior vena cava. LYMPH NODES: No adenopathy. REPRODUCTIVE: Unremarkable.  BONES/SOFT TISSUES: Bones are osteopenic. Left rib fractures, better characterized by concurrent CTA chest.  Other Result Information  Interface,  Rad Results In - 02/26/2018 10:16 AM EDT EXAM: CT ABDOMEN PELVIS W CONTRAST DATE: 02/25/2018 9:50 PM ACCESSION: [id number] UN DICTATED: 02/25/2018 10:15 PM INTERPRETATION LOCATION: [place]: trauma    COMPARISON: Concurrent CTA chest  TECHNIQUE: A spiral CT scan of the abdomen and pelvis was obtained with IV contrast from the lung bases through the pubic symphysis. Images were reconstructed in the axial plane. Coronal and sagittal reformatted images were also provided for further evaluation.  FINDINGS:   LOWER CHEST: Please see separately dictated report for concurrent CTA chest.  ABDOMEN:  HEPATOBILIARY: Unremarkable liver. Mild intrahepatic biliary ductal dilatation. The common bile duct is dilated to 1 cm. No obstructing stone or lesion is seen. Gallbladder is unremarkable. PANCREAS: Pancreatic duct is dilated to 6 mm in the region of the pancreatic head. SPLEEN: Unremarkable. ADRENAL GLANDS: Unremarkable. KIDNEYS/URETERS: Unremarkable. BLADDER: Unremarkable. BOWEL/PERITONEUM/RETROPERITONEUM: No bowel obstruction. No acute inflammatory process. No free fluid or free air. VASCULATURE: Normal caliber abdominal aorta. Patent portal vasculature. Unremarkable inferior vena cava. LYMPH NODES: No adenopathy. REPRODUCTIVE: Unremarkable.  BONES/SOFT TISSUES: Bones are osteopenic. Left rib fractures, better characterized by concurrent CTA chest.  IMPRESSION: - No evidence of acute trauma to the abdomen or pelvis.  - Biliary and pancreatic ductal dilatation without obstructing stone or lesion identified. Further catheterization with MRCP is recommended.       Allergies  Allergen Reactions  . Plaquenil [Hydroxychloroquine] Rash     Current Outpatient Medications:  .  atorvastatin (LIPITOR) 40 MG tablet, TAKE 1 TABLET BY MOUTH ONCE DAILY, Disp: 90 tablet, Rfl: 3 .  b complex vitamins tablet, Take 1 tablet by mouth daily. B12, Disp: , Rfl:  .  busPIRone  (BUSPAR) 15 MG tablet, Take 1  tablet (15 mg total) by mouth 3 (three) times daily., Disp: 270 tablet, Rfl: 1 .  cyclobenzaprine (FLEXERIL) 10 MG tablet, Take by mouth., Disp: , Rfl:  .  diclofenac sodium (VOLTAREN) 1 % GEL, Apply topically 4 (four) times daily., Disp: , Rfl:  .  fluocinonide ointment (LIDEX) 8.11 %, Apply 1 application topically 2 (two) times daily., Disp: , Rfl:  .  FOLIC ACID PO, Take 10 mg tablet twice a day., Disp: , Rfl:  .  furosemide (LASIX) 20 MG tablet, Take 1 tablet (20 mg total) by mouth daily as needed for fluid., Disp: 30 tablet, Rfl: 3 .  guaiFENesin (MUCINEX) 600 MG 12 hr tablet, Take 1,200 mg by mouth 2 (two) times daily., Disp: , Rfl:  .  ibuprofen (ADVIL,MOTRIN) 200 MG tablet, Take 200 mg by mouth every 6 (six) hours as needed. For pain, Disp: , Rfl:  .  Multiple Vitamin (MULTIVITAMIN) tablet, Take 1 tablet by mouth daily.  , Disp: , Rfl:  .  Nutritional Supplements (ESTROVEN ENERGY PO), Take by mouth daily., Disp: , Rfl:  .  omeprazole (PRILOSEC) 20 MG capsule, Take 20 mg by mouth daily., Disp: , Rfl:  .  oxyCODONE (OXY IR/ROXICODONE) 5 MG immediate release tablet, TAKE 1 TABLET BY MOUTH EVERY 4 HOURS AS NEEDED FOR PAIN FORUP TO 5 DAYS, Disp: , Rfl: 0 .  potassium chloride SA (K-DUR,KLOR-CON) 20 MEQ tablet, Take 1 tablet (20 mEq total) by mouth daily. Take with furosemide, Disp: 30 tablet, Rfl: 3 .  sertraline (ZOLOFT) 100 MG tablet, Take 1 tablet (100 mg total) by mouth daily., Disp: 90 tablet, Rfl: 1 .  topiramate (TOPAMAX) 25 MG tablet, 25 mg qhs x 2 weeks and then increase to 25 mg BID, Disp: 60 tablet, Rfl: 2 .  traZODone (DESYREL) 50 MG tablet, Take 1-2 tablets (50-100 mg total) by mouth at bedtime., Disp: 180 tablet, Rfl: 1 .  valACYclovir (VALTREX) 1000 MG tablet, TAKE TWO TABLETS BY MOUTH TWICE DAILY FOR 5 DAYS AS NEEDED, Disp: 180 tablet, Rfl: 3  Current Facility-Administered Medications:  .  ipratropium-albuterol (DUONEB) 0.5-2.5 (3) MG/3ML nebulizer  solution 3 mL, 3 mL, Nebulization, Once, Pollak, Adriana M, PA-C  Review of Systems  Constitutional: Negative.   Respiratory: Negative.   Cardiovascular: Negative.   Gastrointestinal: Positive for abdominal pain and nausea. Negative for abdominal distention, constipation, diarrhea and vomiting.  Musculoskeletal: Positive for arthralgias and myalgias.  Neurological: Negative.     Social History   Tobacco Use  . Smoking status: Current Every Day Smoker    Packs/day: 0.50    Years: 40.00    Pack years: 20.00  . Smokeless tobacco: Never Used  Substance Use Topics  . Alcohol use: No    Comment: recovering alcoholic Since 9147    Objective:   BP 140/82 (BP Location: Right Arm, Patient Position: Sitting, Cuff Size: Normal)   Pulse 80   Temp 97.9 F (36.6 C) (Oral)   Resp 16   Wt 138 lb 9.6 oz (62.9 kg)   SpO2 98%   BMI 20.47 kg/m  Vitals:   03/23/18 1335  BP: 140/82  Pulse: 80  Resp: 16  Temp: 97.9 F (36.6 C)  TempSrc: Oral  SpO2: 98%  Weight: 138 lb 9.6 oz (62.9 kg)     Physical Exam  Constitutional: She appears well-developed and well-nourished. No distress.  Neck: Normal range of motion. Neck supple. No JVD present. No tracheal deviation present. No thyromegaly present.  Cardiovascular: Normal rate, regular  rhythm and normal heart sounds. Exam reveals no gallop and no friction rub.  No murmur heard. Pulmonary/Chest: Effort normal and breath sounds normal. No respiratory distress. She has no wheezes. She has no rales.  Abdominal: Soft. Bowel sounds are normal. She exhibits no mass. There is tenderness in the epigastric area and left upper quadrant. There is no guarding.  Lymphadenopathy:    She has no cervical adenopathy.  Skin: She is not diaphoretic.  Vitals reviewed.      Assessment & Plan:     1. Abnormal CT of the abdomen Testing ordered as below. I will f/u pending results.  - CBC with Differential - Hepatic function panel - Lipase - US Abdomen  Limited RUQ; Future - MR ABDOMEN WITH MRCP W CONTRAST; Future - Cancer antigen 19-9  2. Pancreatic duct dilated See above medical treatment plan. - CBC with Differential - Hepatic function panel - Lipase - US Abdomen Limited RUQ; Future - MR ABDOMEN WITH MRCP W CONTRAST; Future - Cancer antigen 19-9  3. Family history of pancreatic cancer Father passed from pancreatic cancer around age 47 she believes.  - CBC with Differential - Hepatic function panel - Lipase - US Abdomen Limited RUQ; Future - MR ABDOMEN WITH MRCP W CONTRAST; Future - Cancer antigen 19-9  4. Left upper quadrant pain See above medical treatment plan. - CBC with Differential - Hepatic function panel - Lipase - US Abdomen Limited RUQ; Future - MR ABDOMEN WITH MRCP W CONTRAST; Future - Cancer antigen 19-9       [name], PA-C  [facility]

## 2018-03-23 NOTE — Telephone Encounter (Signed)
Viewed by [name] on 03/22/2018 5:24 PM

## 2018-03-24 ENCOUNTER — Telehealth: Payer: Self-pay | Admitting: Physician Assistant

## 2018-03-24 LAB — HEPATIC FUNCTION PANEL
ALT: 11 IU/L (ref 0–32)
AST: 18 IU/L (ref 0–40)
Albumin: 4.4 g/dL (ref 3.6–4.8)
Alkaline Phosphatase: 117 IU/L (ref 39–117)
Bilirubin Total: <0.2 mg/dL (ref 0.0–1.2)
Bilirubin, Direct: 0.08 mg/dL (ref 0.00–0.40)
TOTAL PROTEIN: 6.6 g/dL (ref 6.0–8.5)

## 2018-03-24 LAB — CBC WITH DIFFERENTIAL/PLATELET
BASOS ABS: 0 10*3/uL (ref 0.0–0.2)
Basos: 1 %
EOS (ABSOLUTE): 0.2 10*3/uL (ref 0.0–0.4)
Eos: 3 %
Hematocrit: 36.9 % (ref 34.0–46.6)
Hemoglobin: 12.1 g/dL (ref 11.1–15.9)
Immature Grans (Abs): 0 10*3/uL (ref 0.0–0.1)
Immature Granulocytes: 0 %
Lymphocytes Absolute: 2.1 10*3/uL (ref 0.7–3.1)
Lymphs: 37 %
MCH: 33.6 pg — ABNORMAL HIGH (ref 26.6–33.0)
MCHC: 32.8 g/dL (ref 31.5–35.7)
MCV: 103 fL — ABNORMAL HIGH (ref 79–97)
MONOS ABS: 0.5 10*3/uL (ref 0.1–0.9)
Monocytes: 8 %
NEUTROS PCT: 51 %
Neutrophils Absolute: 3 10*3/uL (ref 1.4–7.0)
PLATELETS: 268 10*3/uL (ref 150–450)
RBC: 3.6 x10E6/uL — ABNORMAL LOW (ref 3.77–5.28)
RDW: 14.5 % (ref 12.3–15.4)
WBC: 5.8 10*3/uL (ref 3.4–10.8)

## 2018-03-24 LAB — CANCER ANTIGEN 19-9: CA 19-9: 41 U/mL — ABNORMAL HIGH (ref 0–35)

## 2018-03-24 LAB — LIPASE: Lipase: 63 U/L (ref 14–72)

## 2018-03-24 NOTE — Telephone Encounter (Signed)
Forms signed. Given to Joseline to fax.

## 2018-03-24 NOTE — Telephone Encounter (Signed)
Pleas send orders for MR ABDOMEN WITH MRCP W CONTRAST and US ABDOMEN LIMITED RUQ to [facility]. Fax # [phone].Pt's appointment is tomorrow,Thanks

## 2018-03-28 ENCOUNTER — Encounter: Payer: Self-pay | Admitting: Physician Assistant

## 2018-03-28 NOTE — Addendum Note (Signed)
Addended by: [name] on: 03/28/2018 05:41 PM   Modules accepted: Orders

## 2018-03-31 ENCOUNTER — Encounter: Payer: Self-pay | Admitting: Physician Assistant

## 2018-04-08 ENCOUNTER — Telehealth: Payer: Self-pay | Admitting: Physician Assistant

## 2018-04-08 NOTE — Telephone Encounter (Signed)
Tiffany with [facility] stated that she had received a call about referring this pt. Tiffany stated she tried to contact but no answer. Tiffany stated their office closes at 12 pm today. Thanks TNP

## 2018-04-13 ENCOUNTER — Other Ambulatory Visit: Payer: Self-pay | Admitting: Student in an Organized Health Care Education/Training Program

## 2018-04-14 ENCOUNTER — Encounter: Payer: Self-pay | Admitting: Physician Assistant

## 2018-04-14 ENCOUNTER — Ambulatory Visit (INDEPENDENT_AMBULATORY_CARE_PROVIDER_SITE_OTHER): Payer: BLUE CROSS/BLUE SHIELD | Admitting: Physician Assistant

## 2018-04-14 VITALS — BP 130/80 | HR 75 | Temp 97.5°F | Resp 16 | Ht 69.0 in | Wt 138.0 lb

## 2018-04-14 DIAGNOSIS — Z23 Encounter for immunization: Secondary | ICD-10-CM | POA: Diagnosis not present

## 2018-04-14 DIAGNOSIS — J418 Mixed simple and mucopurulent chronic bronchitis: Secondary | ICD-10-CM | POA: Diagnosis not present

## 2018-04-14 DIAGNOSIS — E782 Mixed hyperlipidemia: Secondary | ICD-10-CM

## 2018-04-14 DIAGNOSIS — Z0000 Encounter for general adult medical examination without abnormal findings: Secondary | ICD-10-CM | POA: Diagnosis not present

## 2018-04-14 DIAGNOSIS — I10 Essential (primary) hypertension: Secondary | ICD-10-CM

## 2018-04-14 DIAGNOSIS — I471 Supraventricular tachycardia: Secondary | ICD-10-CM | POA: Diagnosis not present

## 2018-04-14 DIAGNOSIS — M069 Rheumatoid arthritis, unspecified: Secondary | ICD-10-CM

## 2018-04-14 DIAGNOSIS — M5412 Radiculopathy, cervical region: Secondary | ICD-10-CM

## 2018-04-14 NOTE — Patient Instructions (Signed)
Health Maintenance for Postmenopausal Women Menopause is a normal process in which your reproductive ability comes to an end. This process happens gradually over a span of months to years, usually between the ages of 22 and 9. Menopause is complete when you have missed 12 consecutive menstrual periods. It is important to talk with your health care provider about some of the most common conditions that affect postmenopausal women, such as heart disease, cancer, and bone loss (osteoporosis). Adopting a healthy lifestyle and getting preventive care can help to promote your health and wellness. Those actions can also lower your chances of developing some of these common conditions. What should I know about menopause? During menopause, you may experience a number of symptoms, such as:  Moderate-to-severe hot flashes.  Night sweats.  Decrease in sex drive.  Mood swings.  Headaches.  Tiredness.  Irritability.  Memory problems.  Insomnia.  Choosing to treat or not to treat menopausal changes is an individual decision that you make with your health care provider. What should I know about hormone replacement therapy and supplements? Hormone therapy products are effective for treating symptoms that are associated with menopause, such as hot flashes and night sweats. Hormone replacement carries certain risks, especially as you become older. If you are thinking about using estrogen or estrogen with progestin treatments, discuss the benefits and risks with your health care provider. What should I know about heart disease and stroke? Heart disease, heart attack, and stroke become more likely as you age. This may be due, in part, to the hormonal changes that your body experiences during menopause. These can affect how your body processes dietary fats, triglycerides, and cholesterol. Heart attack and stroke are both medical emergencies. There are many things that you can do to help prevent heart disease  and stroke:  Have your blood pressure checked at least every 1-2 years. High blood pressure causes heart disease and increases the risk of stroke.  If you are 53-22 years old, ask your health care provider if you should take aspirin to prevent a heart attack or a stroke.  Do not use any tobacco products, including cigarettes, chewing tobacco, or electronic cigarettes. If you need help quitting, ask your health care provider.  It is important to eat a healthy diet and maintain a healthy weight. ? Be sure to include plenty of vegetables, fruits, low-fat dairy products, and lean protein. ? Avoid eating foods that are high in solid fats, added sugars, or salt (sodium).  Get regular exercise. This is one of the most important things that you can do for your health. ? Try to exercise for at least 150 minutes each week. The type of exercise that you do should increase your heart rate and make you sweat. This is known as moderate-intensity exercise. ? Try to do strengthening exercises at least twice each week. Do these in addition to the moderate-intensity exercise.  Know your numbers.Ask your health care provider to check your cholesterol and your blood glucose. Continue to have your blood tested as directed by your health care provider.  What should I know about cancer screening? There are several types of cancer. Take the following steps to reduce your risk and to catch any cancer development as early as possible. Breast Cancer  Practice breast self-awareness. ? This means understanding how your breasts normally appear and feel. ? It also means doing regular breast self-exams. Let your health care provider know about any changes, no matter how small.  If you are 40  or older, have a clinician do a breast exam (clinical breast exam or CBE) every year. Depending on your age, family history, and medical history, it may be recommended that you also have a yearly breast X-ray (mammogram).  If you  have a family history of breast cancer, talk with your health care provider about genetic screening.  If you are at high risk for breast cancer, talk with your health care provider about having an MRI and a mammogram every year.  Breast cancer (BRCA) gene test is recommended for women who have family members with BRCA-related cancers. Results of the assessment will determine the need for genetic counseling and BRCA1 and for BRCA2 testing. BRCA-related cancers include these types: ? Breast. This occurs in males or females. ? Ovarian. ? Tubal. This may also be called fallopian tube cancer. ? Cancer of the abdominal or pelvic lining (peritoneal cancer). ? Prostate. ? Pancreatic.  Cervical, Uterine, and Ovarian Cancer Your health care provider may recommend that you be screened regularly for cancer of the pelvic organs. These include your ovaries, uterus, and vagina. This screening involves a pelvic exam, which includes checking for microscopic changes to the surface of your cervix (Pap test).  For women ages 21-65, health care providers may recommend a pelvic exam and a Pap test every three years. For women ages 79-65, they may recommend the Pap test and pelvic exam, combined with testing for human papilloma virus (HPV), every five years. Some types of HPV increase your risk of cervical cancer. Testing for HPV may also be done on women of any age who have unclear Pap test results.  Other health care providers may not recommend any screening for nonpregnant women who are considered low risk for pelvic cancer and have no symptoms. Ask your health care provider if a screening pelvic exam is right for you.  If you have had past treatment for cervical cancer or a condition that could lead to cancer, you need Pap tests and screening for cancer for at least 20 years after your treatment. If Pap tests have been discontinued for you, your risk factors (such as having a new sexual partner) need to be  reassessed to determine if you should start having screenings again. Some women have medical problems that increase the chance of getting cervical cancer. In these cases, your health care provider may recommend that you have screening and Pap tests more often.  If you have a family history of uterine cancer or ovarian cancer, talk with your health care provider about genetic screening.  If you have vaginal bleeding after reaching menopause, tell your health care provider.  There are currently no reliable tests available to screen for ovarian cancer.  Lung Cancer Lung cancer screening is recommended for adults 69-62 years old who are at high risk for lung cancer because of a history of smoking. A yearly low-dose CT scan of the lungs is recommended if you:  Currently smoke.  Have a history of at least 30 pack-years of smoking and you currently smoke or have quit within the past 15 years. A pack-year is smoking an average of one pack of cigarettes per day for one year.  Yearly screening should:  Continue until it has been 15 years since you quit.  Stop if you develop a health problem that would prevent you from having lung cancer treatment.  Colorectal Cancer  This type of cancer can be detected and can often be prevented.  Routine colorectal cancer screening usually begins at  age 42 and continues through age 45.  If you have risk factors for colon cancer, your health care provider may recommend that you be screened at an earlier age.  If you have a family history of colorectal cancer, talk with your health care provider about genetic screening.  Your health care provider may also recommend using home test kits to check for hidden blood in your stool.  A small camera at the end of a tube can be used to examine your colon directly (sigmoidoscopy or colonoscopy). This is done to check for the earliest forms of colorectal cancer.  Direct examination of the colon should be repeated every  5-10 years until age 71. However, if early forms of precancerous polyps or small growths are found or if you have a family history or genetic risk for colorectal cancer, you may need to be screened more often.  Skin Cancer  Check your skin from head to toe regularly.  Monitor any moles. Be sure to tell your health care provider: ? About any new moles or changes in moles, especially if there is a change in a mole's shape or color. ? If you have a mole that is larger than the size of a pencil eraser.  If any of your family members has a history of skin cancer, especially at a young age, talk with your health care provider about genetic screening.  Always use sunscreen. Apply sunscreen liberally and repeatedly throughout the day.  Whenever you are outside, protect yourself by wearing long sleeves, pants, a wide-brimmed hat, and sunglasses.  What should I know about osteoporosis? Osteoporosis is a condition in which bone destruction happens more quickly than new bone creation. After menopause, you may be at an increased risk for osteoporosis. To help prevent osteoporosis or the bone fractures that can happen because of osteoporosis, the following is recommended:  If you are 46-71 years old, get at least 1,000 mg of calcium and at least 600 mg of vitamin D per day.  If you are older than age 55 but younger than age 65, get at least 1,200 mg of calcium and at least 600 mg of vitamin D per day.  If you are older than age 54, get at least 1,200 mg of calcium and at least 800 mg of vitamin D per day.  Smoking and excessive alcohol intake increase the risk of osteoporosis. Eat foods that are rich in calcium and vitamin D, and do weight-bearing exercises several times each week as directed by your health care provider. What should I know about how menopause affects my mental health? Depression may occur at any age, but it is more common as you become older. Common symptoms of depression  include:  Low or sad mood.  Changes in sleep patterns.  Changes in appetite or eating patterns.  Feeling an overall lack of motivation or enjoyment of activities that you previously enjoyed.  Frequent crying spells.  Talk with your health care provider if you think that you are experiencing depression. What should I know about immunizations? It is important that you get and maintain your immunizations. These include:  Tetanus, diphtheria, and pertussis (Tdap) booster vaccine.  Influenza every year before the flu season begins.  Pneumonia vaccine.  Shingles vaccine.  Your health care provider may also recommend other immunizations. This information is not intended to replace advice given to you by your health care provider. Make sure you discuss any questions you have with your health care provider. Document Released: 09/25/2005  Document Revised: 02/21/2016 Document Reviewed: 05/07/2015 Elsevier Interactive Patient Education  2018 [employer]  

## 2018-04-14 NOTE — Progress Notes (Signed)
Patient: Gabrielle Pineda, Female    DOB: 1956/04/26, 62 y.o.   MRN: [id number] Visit Date: 04/14/2018  Today's Provider: [name], PA-C   Chief Complaint  Patient presents with  . Annual Exam   Subjective:    Annual physical exam Gabrielle Pineda is a 62 y.o. female who presents today for health maintenance and complete physical. She feels fairly well. She reports exercising none. She reports she is sleeping poorly.  Last CPE:04/09/2017 Pap:04/08/16 Negative and HPV-Negative Mammogram:03/22/18 BI-RADS 1 Colonoscopy:11/01/17 Polyp, Pathology-Tubular Adenoma. -----------------------------------------------------------------   Review of Systems  Constitutional: Negative.        "crying"  HENT: Positive for congestion and sinus pressure.   Eyes: Negative.   Respiratory: Negative.   Cardiovascular: Negative.   Gastrointestinal: Positive for abdominal distention and abdominal pain.  Endocrine: Negative.   Genitourinary: Negative.   Musculoskeletal: Positive for arthralgias, neck pain and neck stiffness.  Skin: Negative.   Allergic/Immunologic: Negative.   Neurological: Positive for headaches.  Hematological: Negative.   Psychiatric/Behavioral: Positive for sleep disturbance.    Social History      She  reports that she has been smoking. She has a 20.00 pack-year smoking history. She has never used smokeless tobacco. She reports that she does not drink alcohol or use drugs.       Social History   Socioeconomic History  . Marital status: Married    Spouse name: Not on file  . Number of children: Not on file  . Years of education: Not on file  . Highest education level: Not on file  Occupational History  . Not on file  Social Needs  . Financial resource strain: Not on file  . Food insecurity:    Worry: Not on file    Inability: Not on file  . Transportation needs:    Medical: Not on file    Non-medical: Not on file  Tobacco Use  . Smoking  status: Current Every Day Smoker    Packs/day: 0.50    Years: 40.00    Pack years: 20.00  . Smokeless tobacco: Never Used  Substance and Sexual Activity  . Alcohol use: No    Comment: recovering alcoholic Since [id number]   . Drug use: No  . Sexual activity: Never  Lifestyle  . Physical activity:    Days per week: Not on file    Minutes per session: Not on file  . Stress: Not on file  Relationships  . Social connections:    Talks on phone: Not on file    Gets together: Not on file    Attends religious service: Not on file    Active member of club or organization: Not on file    Attends meetings of clubs or organizations: Not on file    Relationship status: Not on file  Other Topics Concern  . Not on file  Social History Narrative   Married; full time; does not get regular exercise.     Past Medical History:  Diagnosis Date  . Alcohol abuse   . Anemia   . Arthritis   . Cervicalgia   . COPD (chronic obstructive pulmonary disease) ([place])   . Depression   . GERD (gastroesophageal reflux disease)   . Heart murmur    on heard when pt is lying  . Other and unspecified hyperlipidemia   . Tachycardia    d/t questionable anxiety happens every 5- 10 years, sees [facility]     Patient  Active Problem List   Diagnosis Date Noted  . History of adenomatous polyp of colon 11/04/2017  . Cervical radiculopathy 08/02/2017  . Neuropathy 08/02/2017  . Essential hypertension 07/28/2017  . Closed compression fracture of L5 lumbar vertebra 07/07/2017  . Sacral insufficiency fracture with routine healing 07/07/2017  . COPD (chronic obstructive pulmonary disease) ([place]) 04/10/2015  . Family history of malignant neoplasm of pancreas 04/03/2015  . Hypercholesteremia 04/03/2015  . Disorder of iron metabolism 04/03/2015  . Weight loss 04/03/2015  . Delayed onset of urination 04/03/2015  . Acid reflux 10/11/2014  . Closed fracture of distal phalanx of thumb 08/15/2014  . Arthritis,  degenerative 01/30/2014  . Arthritis or polyarthritis, rheumatoid ([place]) 01/30/2014  . Shortness of breath 12/22/2013  . Hyperlipidemia 09/22/2010  . SMOKER 09/22/2010  . Paroxysmal supraventricular tachycardia ([place]) 09/22/2010  . CHEST PAIN UNSPECIFIED 09/22/2010  . ELECTROCARDIOGRAM, ABNORMAL 09/22/2010  . B-complex deficiency 09/09/2007  . CN (constipation) 06/26/2007  . Clinical depression 06/26/2007  . Cold sore 06/26/2007  . H/O alcohol abuse 06/26/2007  . Cannot sleep 06/26/2007  . Localized osteoarthrosis, hand 06/26/2007  . Menopausal symptom 06/26/2007    Past Surgical History:  Procedure Laterality Date  . AUGMENTATION MAMMAPLASTY Bilateral 1982  . BREAST ENHANCEMENT SURGERY  1981  . CARPAL TUNNEL RELEASE  11/11/2011   Procedure: CARPAL TUNNEL RELEASE;  Surgeon: Floyce Stakes, MD;  Location: [facility];  Service: Neurosurgery;  Laterality: Right;  Right Median Nerve Decompression  . CARPAL TUNNEL RELEASE  2013  . COLONOSCOPY WITH PROPOFOL N/A 11/01/2017   Procedure: COLONOSCOPY WITH PROPOFOL;  Surgeon: Manya Silvas, MD;  Location: [facility] ENDOSCOPY;  Service: Endoscopy;  Laterality: N/A;  . ESOPHAGOGASTRODUODENOSCOPY    . neck disc surgery     plates and screws in neck x 2  . ROTATOR CUFF REPAIR  06/16/2016   right shoulder   . ROTATOR CUFF REPAIR    . TONSILLECTOMY AND ADENOIDECTOMY  1973     Family History        Family Status  Relation Name Status  . Father  Deceased at age 35  . Mother  Deceased at age 47       suicide, drug OD   . Neg Hx  (Not Specified)        Her family history includes Alcohol abuse in her father and mother; Bipolar disorder in her mother; Pancreatic cancer in her father; Suicidality in her mother. There is no history of Anesthesia problems or Breast cancer.      Allergies  Allergen Reactions  . Plaquenil [Hydroxychloroquine] Rash     Current Outpatient Medications:  .  atorvastatin (LIPITOR) 40 MG tablet, TAKE 1 TABLET BY  MOUTH ONCE DAILY, Disp: 90 tablet, Rfl: 3 .  b complex vitamins tablet, Take 1 tablet by mouth daily. B12, Disp: , Rfl:  .  busPIRone (BUSPAR) 15 MG tablet, Take 1 tablet (15 mg total) by mouth 3 (three) times daily., Disp: 270 tablet, Rfl: 1 .  cyclobenzaprine (FLEXERIL) 10 MG tablet, Take by mouth., Disp: , Rfl:  .  diclofenac sodium (VOLTAREN) 1 % GEL, Apply topically 4 (four) times daily., Disp: , Rfl:  .  fluocinonide ointment (LIDEX) 5.17 %, Apply 1 application topically 2 (two) times daily., Disp: , Rfl:  .  FOLIC ACID PO, Take 10 mg tablet twice a day., Disp: , Rfl:  .  furosemide (LASIX) 20 MG tablet, Take 1 tablet (20 mg total) by mouth daily as needed for fluid.,  Disp: 30 tablet, Rfl: 3 .  guaiFENesin (MUCINEX) 600 MG 12 hr tablet, Take 1,200 mg by mouth 2 (two) times daily., Disp: , Rfl:  .  ibuprofen (ADVIL,MOTRIN) 200 MG tablet, Take 200 mg by mouth every 6 (six) hours as needed. For pain, Disp: , Rfl:  .  Multiple Vitamin (MULTIVITAMIN) tablet, Take 1 tablet by mouth daily.  , Disp: , Rfl:  .  Nutritional Supplements (ESTROVEN ENERGY PO), Take by mouth daily., Disp: , Rfl:  .  omeprazole (PRILOSEC) 20 MG capsule, Take 20 mg by mouth daily., Disp: , Rfl:  .  oxyCODONE (OXY IR/ROXICODONE) 5 MG immediate release tablet, TAKE 1 TABLET BY MOUTH EVERY 4 HOURS AS NEEDED FOR PAIN FORUP TO 5 DAYS, Disp: , Rfl: 0 .  potassium chloride SA (K-DUR,KLOR-CON) 20 MEQ tablet, Take 1 tablet (20 mEq total) by mouth daily. Take with furosemide, Disp: 30 tablet, Rfl: 3 .  sertraline (ZOLOFT) 100 MG tablet, Take 1 tablet (100 mg total) by mouth daily., Disp: 90 tablet, Rfl: 1 .  topiramate (TOPAMAX) 25 MG tablet, 25 mg qhs x 2 weeks and then increase to 25 mg BID, Disp: 60 tablet, Rfl: 2 .  traZODone (DESYREL) 50 MG tablet, Take 1-2 tablets (50-100 mg total) by mouth at bedtime., Disp: 180 tablet, Rfl: 1 .  valACYclovir (VALTREX) 1000 MG tablet, TAKE TWO TABLETS BY MOUTH TWICE DAILY FOR 5 DAYS AS NEEDED,  Disp: 180 tablet, Rfl: 3  Current Facility-Administered Medications:  .  ipratropium-albuterol (DUONEB) 0.5-2.5 (3) MG/3ML nebulizer solution 3 mL, 3 mL, Nebulization, Once, Trinna Post, PA-C   Patient Care Team: [name], PA-C as PCP - General (Family Medicine) Manya Silvas, MD (Gastroenterology)      Objective:   Vitals: BP 130/80 (BP Location: Right Arm, Patient Position: Sitting, Cuff Size: Normal)   Pulse 75   Temp (!) 97.5 F (36.4 C) (Oral)   Resp 16   Ht 5\' 9"  (1.753 m)   Wt 138 lb (62.6 kg)   BMI 20.38 kg/m    Vitals:   04/14/18 1015  BP: 130/80  Pulse: 75  Resp: 16  Temp: (!) 97.5 F (36.4 C)  TempSrc: Oral  Weight: 138 lb (62.6 kg)  Height: 5\' 9"  (1.753 m)     Physical Exam  Constitutional: She is oriented to person, place, and time. She appears well-developed and well-nourished. No distress.  HENT:  Head: Normocephalic and atraumatic.  Right Ear: Hearing, tympanic membrane, external ear and ear canal normal.  Left Ear: Hearing, tympanic membrane, external ear and ear canal normal.  Nose: Nose normal.  Mouth/Throat: Uvula is midline, oropharynx is clear and moist and mucous membranes are normal. No oropharyngeal exudate.  Eyes: Pupils are equal, round, and reactive to light. Conjunctivae and EOM are normal. Right eye exhibits no discharge. Left eye exhibits no discharge. No scleral icterus.  Neck: Normal range of motion. Neck supple. No JVD present. Carotid bruit is not present. No tracheal deviation present. No thyromegaly present.  Cardiovascular: Normal rate, regular rhythm, normal heart sounds and intact distal pulses. Exam reveals no gallop and no friction rub.  No murmur heard. Pulmonary/Chest: Effort normal and breath sounds normal. No respiratory distress. She has no wheezes. She has no rales. She exhibits no tenderness.  Abdominal: Soft. Bowel sounds are normal. She exhibits no distension and no mass. There is no tenderness.  There is no rebound and no guarding.  Musculoskeletal: Normal range of motion. She exhibits no edema  or tenderness.  Lymphadenopathy:    She has no cervical adenopathy.  Neurological: She is alert and oriented to person, place, and time.  Skin: Skin is warm and dry. No rash noted. She is not diaphoretic.  Psychiatric: She has a normal mood and affect. Her behavior is normal. Judgment and thought content normal.  Vitals reviewed.    Depression Screen PHQ 2/9 Scores 04/14/2018 02/21/2018 01/03/2018 12/15/2017  PHQ - 2 Score 2 1 3  0  PHQ- 9 Score 7 - 5 0      Assessment & Plan:     Routine Health Maintenance and Physical Exam  Exercise Activities and Dietary recommendations Goals   None     Immunization History  Administered Date(s) Administered  . Influenza,inj,Quad PF,6+ Mos 11/18/2015, 07/23/2016, 07/02/2017  . Influenza-Unspecified 09/24/2014  . Pneumococcal Polysaccharide-23 09/24/2014  . Tdap 11/15/2015    Health Maintenance  Topic Date Due  . INFLUENZA VACCINE  03/17/2018  . PAP SMEAR  04/09/2019  . MAMMOGRAM  03/22/2020  . COLONOSCOPY  11/02/2022  . TETANUS/TDAP  11/14/2025  . Hepatitis C Screening  Completed  . HIV Screening  Discontinued     Discussed health benefits of physical activity, and encouraged her to engage in regular exercise appropriate for her age and condition.    1. Annual physical exam Normal physical exam today. Will check labs as below and f/u pending lab results. If labs are stable and WNL she will not need to have these rechecked for one year at her next annual physical exam. She is to call the office in the meantime if she has any acute issue, questions or concerns. - Comprehensive metabolic panel - Hemoglobin A1c - Lipid panel - TSH - CBC w/Diff/Platelet  2. Mixed simple and mucopurulent chronic bronchitis (HCC) Stable. Patient continues to smoke, does not desire to quit. Has Duoneb at home for exacerbations. Has not required daily  inhaler yet.  - Comprehensive metabolic panel - Hemoglobin A1c - Lipid panel - TSH - CBC w/Diff/Platelet  3. Paroxysmal supraventricular tachycardia (HCC) Stable. Will check labs as below and f/u pending results. - Comprehensive metabolic panel - Hemoglobin A1c - Lipid panel - TSH - CBC w/Diff/Platelet  4. Essential hypertension Stable. Will check labs as below and f/u pending results. - Comprehensive metabolic panel - Hemoglobin A1c - Lipid panel - TSH - CBC w/Diff/Platelet  5. Cervical radiculopathy Followed by Dr. Holley Raring, pain management. Is due for appt. Patient will call to schedule. Will check labs as below and f/u pending results. - Comprehensive metabolic panel - Hemoglobin A1c - Lipid panel - TSH - CBC w/Diff/Platelet  6. Mixed hyperlipidemia Stable. Continue atorvastatin 40mg . Will check labs as below and f/u pending results. - Comprehensive metabolic panel - Hemoglobin A1c - Lipid panel - TSH - CBC w/Diff/Platelet  7. Rheumatoid arthritis involving multiple sites, unspecified rheumatoid factor presence (HCC) Stable. Followed by Rheumatology. Will check labs as below and f/u pending results. - Comprehensive metabolic panel - Hemoglobin A1c - Lipid panel - TSH - CBC w/Diff/Platelet  8. Influenza vaccine needed Flu vaccine given today without complication. Patient sat upright for 15 minutes to check for adverse reaction before being released. - Flu Vaccine QUAD 36+ mos IM  --------------------------------------------------------------------    [name], PA-C  [facility]

## 2018-04-15 LAB — CBC WITH DIFFERENTIAL/PLATELET
BASOS: 1 %
Basophils Absolute: 0.1 10*3/uL (ref 0.0–0.2)
EOS (ABSOLUTE): 0.1 10*3/uL (ref 0.0–0.4)
Eos: 2 %
Hematocrit: 35.8 % (ref 34.0–46.6)
Hemoglobin: 12.2 g/dL (ref 11.1–15.9)
IMMATURE GRANS (ABS): 0 10*3/uL (ref 0.0–0.1)
Immature Granulocytes: 0 %
LYMPHS ABS: 1 10*3/uL (ref 0.7–3.1)
Lymphs: 20 %
MCH: 33.6 pg — AB (ref 26.6–33.0)
MCHC: 34.1 g/dL (ref 31.5–35.7)
MCV: 99 fL — AB (ref 79–97)
MONOS ABS: 0.4 10*3/uL (ref 0.1–0.9)
Monocytes: 8 %
NEUTROS ABS: 3.4 10*3/uL (ref 1.4–7.0)
Neutrophils: 69 %
PLATELETS: 268 10*3/uL (ref 150–450)
RBC: 3.63 x10E6/uL — ABNORMAL LOW (ref 3.77–5.28)
RDW: 12.7 % (ref 12.3–15.4)
WBC: 4.9 10*3/uL (ref 3.4–10.8)

## 2018-04-15 LAB — COMPREHENSIVE METABOLIC PANEL
ALT: 14 IU/L (ref 0–32)
AST: 20 IU/L (ref 0–40)
Albumin/Globulin Ratio: 1.7 (ref 1.2–2.2)
Albumin: 4.2 g/dL (ref 3.6–4.8)
Alkaline Phosphatase: 107 IU/L (ref 39–117)
BILIRUBIN TOTAL: 0.3 mg/dL (ref 0.0–1.2)
BUN / CREAT RATIO: 7 — AB (ref 12–28)
BUN: 5 mg/dL — AB (ref 8–27)
CO2: 21 mmol/L (ref 20–29)
CREATININE: 0.71 mg/dL (ref 0.57–1.00)
Calcium: 9.4 mg/dL (ref 8.7–10.3)
Chloride: 105 mmol/L (ref 96–106)
GFR calc non Af Amer: 92 mL/min/{1.73_m2} (ref >59)
GFR, EST AFRICAN AMERICAN: 106 mL/min/{1.73_m2} (ref >59)
GLOBULIN, TOTAL: 2.5 g/dL (ref 1.5–4.5)
Glucose: 95 mg/dL (ref 65–99)
Potassium: 3.8 mmol/L (ref 3.5–5.2)
Sodium: 140 mmol/L (ref 134–144)
TOTAL PROTEIN: 6.7 g/dL (ref 6.0–8.5)

## 2018-04-15 LAB — TSH: TSH: 1.7 u[IU]/mL (ref 0.450–4.500)

## 2018-04-15 LAB — LIPID PANEL
Chol/HDL Ratio: 3.8 ratio (ref 0.0–4.4)
Cholesterol, Total: 238 mg/dL — ABNORMAL HIGH (ref 100–199)
HDL: 63 mg/dL (ref >39)
LDL CALC: 130 mg/dL — AB (ref 0–99)
TRIGLYCERIDES: 223 mg/dL — AB (ref 0–149)
VLDL Cholesterol Cal: 45 mg/dL — ABNORMAL HIGH (ref 5–40)

## 2018-04-15 LAB — HEMOGLOBIN A1C
Est. average glucose Bld gHb Est-mCnc: 97 mg/dL
Hgb A1c MFr Bld: 5 % (ref 4.8–5.6)

## 2018-04-26 ENCOUNTER — Telehealth: Payer: Self-pay

## 2018-04-26 NOTE — Telephone Encounter (Signed)
-----   Message from [name], PA-C sent at 04/26/2018 10:07 AM EDT ----- Cholesterol up from last year. All other labs are stable and/or normal. Make sure to be taking atorvastatin.

## 2018-04-26 NOTE — Telephone Encounter (Signed)
Patient advised as directed below.  Thanks,  -Joseline 

## 2018-05-03 ENCOUNTER — Encounter: Payer: Self-pay | Admitting: Physician Assistant

## 2018-05-03 DIAGNOSIS — G629 Polyneuropathy, unspecified: Secondary | ICD-10-CM

## 2018-05-03 MED ORDER — GABAPENTIN 600 MG PO TABS: 1200.0000 mg | ORAL_TABLET | Freq: Every day | ORAL | 1 refills | Status: DC

## 2018-05-04 ENCOUNTER — Ambulatory Visit
Admission: RE | Admit: 2018-05-04 | Discharge: 2018-05-04 | Disposition: A | Discharge status: Home / Self Care | Payer: BLUE CROSS/BLUE SHIELD | Source: Ambulatory Visit | Attending: Student in an Organized Health Care Education/Training Program | Admitting: Student in an Organized Health Care Education/Training Program

## 2018-05-04 ENCOUNTER — Encounter: Payer: Self-pay | Admitting: Student in an Organized Health Care Education/Training Program

## 2018-05-04 ENCOUNTER — Other Ambulatory Visit: Payer: Self-pay

## 2018-05-04 ENCOUNTER — Ambulatory Visit (HOSPITAL_BASED_OUTPATIENT_CLINIC_OR_DEPARTMENT_OTHER): Payer: BLUE CROSS/BLUE SHIELD | Admitting: Student in an Organized Health Care Education/Training Program

## 2018-05-04 VITALS — BP 153/82 | HR 88 | Temp 98.2°F | Resp 22 | Ht 69.0 in | Wt 135.0 lb

## 2018-05-04 DIAGNOSIS — M47812 Spondylosis without myelopathy or radiculopathy, cervical region: Secondary | ICD-10-CM

## 2018-05-04 DIAGNOSIS — M5030 Other cervical disc degeneration, unspecified cervical region: Secondary | ICD-10-CM | POA: Insufficient documentation

## 2018-05-04 MED ORDER — DEXAMETHASONE SODIUM PHOSPHATE 10 MG/ML IJ SOLN
10.0000 mg | Freq: Once | INTRAMUSCULAR | Status: AC
  Administered 2018-05-04: 10 mg

## 2018-05-04 MED ORDER — FENTANYL CITRATE (PF) 100 MCG/2ML IJ SOLN
25.0000 ug | INTRAMUSCULAR | Status: DC | PRN
  Administered 2018-05-04: 100 ug via INTRAVENOUS

## 2018-05-04 MED ORDER — DEXAMETHASONE SODIUM PHOSPHATE 10 MG/ML IJ SOLN
INTRAMUSCULAR | Status: AC
  Filled 2018-05-04: qty 1

## 2018-05-04 MED ORDER — ROPIVACAINE HCL 2 MG/ML IJ SOLN
INTRAMUSCULAR | Status: AC
  Filled 2018-05-04: qty 10

## 2018-05-04 MED ORDER — ROPIVACAINE HCL 2 MG/ML IJ SOLN
10.0000 mL | Freq: Once | INTRAMUSCULAR | Status: AC
  Administered 2018-05-04: 10 mL

## 2018-05-04 MED ORDER — LACTATED RINGERS IV SOLN
1000.0000 mL | Freq: Once | INTRAVENOUS | Status: AC
  Administered 2018-05-04: 1000 mL via INTRAVENOUS

## 2018-05-04 MED ORDER — LIDOCAINE HCL 2 % IJ SOLN
20.0000 mL | Freq: Once | INTRAMUSCULAR | Status: AC
  Administered 2018-05-04: 400 mg

## 2018-05-04 MED ORDER — LIDOCAINE HCL 2 % IJ SOLN
INTRAMUSCULAR | Status: AC
  Filled 2018-05-04: qty 20

## 2018-05-04 MED ORDER — TOPIRAMATE 25 MG PO TABS: 25.0000 mg | ORAL_TABLET | Freq: Two times a day (BID) | ORAL | 5 refills | Status: DC

## 2018-05-04 MED ORDER — FENTANYL CITRATE (PF) 100 MCG/2ML IJ SOLN
INTRAMUSCULAR | Status: AC
  Filled 2018-05-04: qty 2

## 2018-05-04 NOTE — Progress Notes (Signed)
For for patient's Name: Gabrielle Pineda  MRN: [id number]  Referring Provider: Birdie Sons, MD  DOB: Nov 16, 1955  PCP: [name], PA-C  DOS: 05/04/2018  Note by: [name], MD  Service setting: Ambulatory outpatient  Specialty: Interventional Pain Management  Patient type: Established  Location: [facility] (AMB) Pain Management Facility  Visit type: Interventional Procedure   Primary Reason for Visit: Interventional Pain Management Treatment. QI:[facility] pain  Procedure:       Anesthesia, Analgesia, Anxiolysis:  Type: Cervical Facet Medial Branch Block(s) #2  Primary Purpose: Diagnostic Region: Posterolateral cervical spine Level: C4, C5, C6, & C7 Medial Branch Level(s). Injecting these levels blocks the C4-5, C5-6, and C6-7 cervical facet joints. Laterality: Right-Sided Paraspinal  Type: Moderate (Conscious) Sedation combined with Local Anesthesia Indication(s): Analgesia and Anxiety Route: Intravenous (IV) IV Access: Secured Sedation: Meaningful verbal contact was maintained at all times during the procedure  Local Anesthetic: Lidocaine 1-2%   Indications: 1. Spondylosis of cervical region without myelopathy or radiculopathy   2. DDD (degenerative disc disease), cervical    Pain Score: Pre-procedure: 9 /10 Post-procedure: 0-No pain/10  Pre-op Assessment:  Gabrielle Pineda is a 62 y.o. (year old), female patient, seen today for interventional treatment. She  has a past surgical history that includes neck disc surgery; Breast enhancement surgery (1981); Esophagogastroduodenoscopy; Tonsillectomy and adenoidectomy (1973 ); Carpal tunnel release (11/11/2011); Carpal tunnel release (2013); Rotator cuff repair (06/16/2016); Rotator cuff repair; Colonoscopy with propofol (N/A, 11/01/2017); and Augmentation mammaplasty (Bilateral, 1982). Ms. Boesch has a current medication list which includes the following prescription(s): atorvastatin, b complex vitamins, buspirone, cyclobenzaprine,  diclofenac sodium, fluocinonide ointment, folic acid, furosemide, gabapentin, guaifenesin, ibuprofen, multivitamin, misc natural products, omeprazole, oxycodone, potassium chloride sa, sertraline, topiramate, trazodone, and valacyclovir, and the following Facility-Administered Medications: fentanyl, ipratropium-albuterol, and lactated ringers. Her primarily concern today is the Procedure and Medication Refill (topomax )  Initial Vital Signs:  Pulse/HCG Rate: 88ECG Heart Rate: 90 Temp: 98.2 F (36.8 C) Resp: 18 BP: (!) 143/79 SpO2: 98 %  BMI: Estimated body mass index is 19.94 kg/m as calculated from the following:   Height as of this encounter: 5\' 9"  (1.753 m).   Weight as of this encounter: 135 lb (61.2 kg).  Risk Assessment: Allergies: Reviewed. She is allergic to plaquenil [hydroxychloroquine].  Allergy Precautions: None required Coagulopathies: Reviewed. None identified.  Blood-thinner therapy: None at this time Active Infection(s): Reviewed. None identified. Gabrielle Pineda is afebrile  Site Confirmation: Gabrielle Pineda was asked to confirm the procedure and laterality before marking the site Procedure checklist: Completed Consent: Before the procedure and under the influence of no sedative(s), amnesic(s), or anxiolytics, the patient was informed of the treatment options, risks and possible complications. To fulfill our ethical and legal obligations, as recommended by the American Medical Association's Code of Ethics, I have informed the patient of my clinical impression; the nature and purpose of the treatment or procedure; the risks, benefits, and possible complications of the intervention; the alternatives, including doing nothing; the risk(s) and benefit(s) of the alternative treatment(s) or procedure(s); and the risk(s) and benefit(s) of doing nothing. The patient was provided information about the general risks and possible complications associated with the procedure. These may  include, but are not limited to: failure to achieve desired goals, infection, bleeding, organ or nerve damage, allergic reactions, paralysis, and death. In addition, the patient was informed of those risks and complications associated to Spine-related procedures, such as failure to decrease pain; infection (i.e.: Meningitis, epidural or intraspinal abscess);  bleeding (i.e.: epidural hematoma, subarachnoid hemorrhage, or any other type of intraspinal or peri-dural bleeding); organ or nerve damage (i.e.: Any type of peripheral nerve, nerve root, or spinal cord injury) with subsequent damage to sensory, motor, and/or autonomic systems, resulting in permanent pain, numbness, and/or weakness of one or several areas of the body; allergic reactions; (i.e.: anaphylactic reaction); and/or death. Furthermore, the patient was informed of those risks and complications associated with the medications. These include, but are not limited to: allergic reactions (i.e.: anaphylactic or anaphylactoid reaction(s)); adrenal axis suppression; blood sugar elevation that in diabetics may result in ketoacidosis or comma; water retention that in patients with history of congestive heart failure may result in shortness of breath, pulmonary edema, and decompensation with resultant heart failure; weight gain; swelling or edema; medication-induced neural toxicity; particulate matter embolism and blood vessel occlusion with resultant organ, and/or nervous system infarction; and/or aseptic necrosis of one or more joints. Finally, the patient was informed that Medicine is not an exact science; therefore, there is also the possibility of unforeseen or unpredictable risks and/or possible complications that may result in a catastrophic outcome. The patient indicated having understood very clearly. We have given the patient no guarantees and we have made no promises. Enough time was given to the patient to ask questions, all of which were answered  to the patient's satisfaction. Gabrielle Pineda has indicated that she wanted to continue with the procedure. Attestation: I, the ordering provider, attest that I have discussed with the patient the benefits, risks, side-effects, alternatives, likelihood of achieving goals, and potential problems during recovery for the procedure that I have provided informed consent. Date  Time:   Pre-Procedure Preparation:  Monitoring: As per clinic protocol. Respiration, ETCO2, SpO2, BP, heart rate and rhythm monitor placed and checked for adequate function Safety Precautions: Patient was assessed for positional comfort and pressure points before starting the procedure. Time-out: I initiated and conducted the "Time-out" before starting the procedure, as per protocol. The patient was asked to participate by confirming the accuracy of the "Time Out" information. Verification of the correct person, site, and procedure were performed and confirmed by me, the nursing staff, and the patient. "Time-out" conducted as per Joint Commission's Universal Protocol (UP.01.01.01). Time: 1014  Description of Procedure:       Position: Prone with head of the table raised to facilitate breathing. Laterality: Right Level: C4, C5, C6, & C7 Medial Branch Level(s). Area Prepped: Posterior Cervico-thoracic Region Prepping solution: ChloraPrep (2% chlorhexidine gluconate and 70% isopropyl alcohol) Safety Precautions: Aspiration looking for blood return was conducted prior to all injections. At no point did we inject any substances, as a needle was being advanced. Before injecting, the patient was told to immediately notify me if she was experiencing any new onset of "ringing in the ears, or metallic taste in the mouth". No attempts were made at seeking any paresthesias. Safe injection practices and needle disposal techniques used. Medications properly checked for expiration dates. SDV (single dose vial) medications used. After the  completion of the procedure, all disposable equipment used was discarded in the proper designated medical waste containers. Local Anesthesia: Protocol guidelines were followed. The patient was positioned over the fluoroscopy table. The area was prepped in the usual manner. The time-out was completed. The target area was identified using fluoroscopy. A 12-in long, straight, sterile hemostat was used with fluoroscopic guidance to locate the targets for each level blocked. Once located, the skin was marked with an approved surgical skin marker. Once all  sites were marked, the skin (epidermis, dermis, and hypodermis), as well as deeper tissues (fat, connective tissue and muscle) were infiltrated with a small amount of a short-acting local anesthetic, loaded on a 10cc syringe with a 25G, 1.5-in  Needle. An appropriate amount of time was allowed for local anesthetics to take effect before proceeding to the next step. Local Anesthetic: Lidocaine 2.0% The unused portion of the local anesthetic was discarded in the proper designated containers. Technical explanation of process:   C4 Medial Branch Nerve Block (MBB): The target area for the C4 dorsal medial articular branch is the lateral concave waist of the articular pillar of C4. Under fluoroscopic guidance, a Quincke needle was inserted until contact was made with os over the postero-lateral aspect of the articular pillar of C4 (target area). After negative aspiration for blood, 62mL of the nerve block solution was injected without difficulty or complication. The needle was removed intact. C5 Medial Branch Nerve Block (MBB): The target area for the C5 dorsal medial articular branch is the lateral concave waist of the articular pillar of C5. Under fluoroscopic guidance, a Quincke needle was inserted until contact was made with os over the postero-lateral aspect of the articular pillar of C5 (target area). After negative aspiration for blood, 1 mL of the nerve block  solution was injected without difficulty or complication. The needle was removed intact. C6 Medial Branch Nerve Block (MBB): The target area for the C6 dorsal medial articular branch is the lateral concave waist of the articular pillar of C6. Under fluoroscopic guidance, a Quincke needle was inserted until contact was made with os over the postero-lateral aspect of the articular pillar of C6 (target area). After negative aspiration for blood, 59mL of the nerve block solution was injected without difficulty or complication. The needle was removed intact. C7 Medial Branch Nerve Block (MBB): The target for the C7 dorsal medial articular branch lies on the superior-medial tip of the C7 transverse process. Under fluoroscopic guidance, a Quincke needle was inserted until contact was made with os over the postero-lateral aspect of the articular pillar of C7 (target area). After negative aspiration for blood, 71mL of the nerve block solution was injected without difficulty or complication. The needle was removed intact.  Procedural Needles: 22-gauge, 3.5-inch, Quincke needles used for all levels. Nerve block solution: 5 cc solution made of 4 cc of 0.2% ropivacaine, 1 cc of Decadron 10 mg/cc.  1 cc injected at each level above.  The unused portion of the solution was discarded in the proper designated containers.  Once the entire procedure was completed, the treated area was cleaned, making sure to leave some of the prepping solution back to take advantage of its long term bactericidal properties.  Vitals:   05/04/18 1026 05/04/18 1035 05/04/18 1045 05/04/18 1055  BP: 139/86 (!) 147/89 (!) 141/84 (!) 153/82  Pulse:      Resp: 20 (!) 21 (!) 24 (!) 22  Temp:      SpO2: 96% 99% 99% 100%  Weight:      Height:        Start Time: 1014 hrs. End Time: 1026 hrs.  Imaging Guidance (Spinal):  Type of Imaging Technique: Fluoroscopy Guidance (Spinal) Indication(s): Assistance in needle guidance and placement for  procedures requiring needle placement in or near specific anatomical locations not easily accessible without such assistance. Exposure Time: Please see nurses notes. Contrast: None used. Fluoroscopic Guidance: I was personally present during the use of fluoroscopy. "Tunnel Vision Technique" used to  obtain the best possible view of the target area. Parallax error corrected before commencing the procedure. "Direction-depth-direction" technique used to introduce the needle under continuous pulsed fluoroscopy. Once target was reached, antero-posterior, oblique, and lateral fluoroscopic projection used confirm needle placement in all planes. Images permanently stored in EMR. Interpretation: No contrast injected. I personally interpreted the imaging intraoperatively. Adequate needle placement confirmed in multiple planes. Permanent images saved into the patient's record.  Antibiotic Prophylaxis:   Anti-infectives (From admission, onward)   None     Indication(s): None identified  Post-operative Assessment:  Post-procedure Vital Signs:  Pulse/HCG Rate: 8877 Temp: 98.2 F (36.8 C) Resp: (!) 22 BP: (!) 153/82 SpO2: 100 %  EBL: None  Complications: No immediate post-treatment complications observed by team, or reported by patient.  Note: The patient tolerated the entire procedure well. A repeat set of vitals were taken after the procedure and the patient was kept under observation following institutional policy, for this type of procedure. Post-procedural neurological assessment was performed, showing return to baseline, prior to discharge. The patient was provided with post-procedure discharge instructions, including a section on how to identify potential problems. Should any problems arise concerning this procedure, the patient was given instructions to immediately contact us, at any time, without hesitation. In any case, we plan to contact the patient by telephone for a follow-up status report  regarding this interventional procedure.  Comments:  No additional relevant information. 5 out of 5 strength bilateral upper extremity: Shoulder abduction, elbow flexion, elbow extension, thumb extension.  Plan of Care    Imaging Orders     DG C-Arm 1-60 Min-No Report Procedure Orders    No procedure(s) ordered today    Medications ordered for procedure: Meds ordered this encounter  Medications  . lactated ringers infusion 1,000 mL  . fentaNYL (SUBLIMAZE) injection 25-100 mcg    Make sure Narcan is available in the pyxis when using this medication. In the event of respiratory depression (RR< 8/min): Titrate NARCAN (naloxone) in increments of 0.1 to 0.2 mg IV at 2-3 minute intervals, until desired degree of reversal.  . ropivacaine (PF) 2 mg/mL (0.2%) (NAROPIN) injection 10 mL  . lidocaine (XYLOCAINE) 2 % (with pres) injection 400 mg  . dexamethasone (DECADRON) injection 10 mg  . topiramate (TOPAMAX) 25 MG tablet    Sig: Take 1 tablet (25 mg total) by mouth 2 (two) times daily.    Dispense:  60 tablet    Refill:  5   Medications administered: We administered lactated ringers, fentaNYL, ropivacaine (PF) 2 mg/mL (0.2%), lidocaine, and dexamethasone.  See the medical record for exact dosing, route, and time of administration.  New Prescriptions   No medications on file   Disposition: Discharge home  Discharge Date & Time: 05/04/2018; 1058 hrs.   Physician-requested Follow-up: Return in about 6 weeks (around 06/15/2018) for Post Procedure Evaluation.  Future Appointments  Date Time Provider [place]  06/15/2018  1:45 PM [name], MD [facility] ([place] None   Primary Care Physician: Rubye Beach Location: [facility] Note by: [name], MD Date: 05/04/2018; Time: 10:59 AM  Disclaimer:  Medicine is not an exact science. The only guarantee in medicine is that nothing is guaranteed. It is important to note that the decision to  proceed with this intervention was based on the information collected from the patient. The Data and conclusions were drawn from the patient's questionnaire, the interview, and the physical examination. Because the information was provided in large part  by the patient, it cannot be guaranteed that it has not been purposely or unconsciously manipulated. Every effort has been made to obtain as much relevant data as possible for this evaluation. It is important to note that the conclusions that lead to this procedure are derived in large part from the available data. Always take into account that the treatment will also be dependent on availability of resources and existing treatment guidelines, considered by other Pain Management Practitioners as being common knowledge and practice, at the time of the intervention. For Medico-Legal purposes, it is also important to point out that variation in procedural techniques and pharmacological choices are the acceptable norm. The indications, contraindications, technique, and results of the above procedure should only be interpreted and judged by a Board-Certified Interventional Pain Specialist with extensive familiarity and expertise in the same exact procedure and technique.

## 2018-05-04 NOTE — Patient Instructions (Addendum)
A prescrioption for Topomax was sent to your pharmacy.____________________________________________________________________________________________  Post-procedure Information What to expect: Most procedures involve the use of a local anesthetic (numbing medicine), and a steroid (anti-inflammatory medicine).  The local anesthetics may cause temporary numbness and weakness of the legs or arms, depending on the location of the block. This numbness/weakness may last 4-6 hours, depending on the local anesthetic used. In rare instances, it can last up to 24 hours. While numb, you must be very careful not to injure the extremity.  After any procedure, you could expect the pain to get better within 15-20 minutes. This relief is temporary and may last 4-6 hours. Once the local anesthetics wears off, you could experience discomfort, possibly more than usual, for up to 10 (ten) days. In the case of radiofrequencies, it may last up to 6 weeks. Surgeries may take up to 8 weeks for the healing process. The discomfort is due to the irritation caused by needles going through skin and muscle. To minimize the discomfort, we recommend using ice the first day, and heat from then on. The ice should be applied for 15 minutes on, and 15 minutes off. Keep repeating this cycle until bedtime. Avoid applying the ice directly to the skin, to prevent frostbite. Heat should be used daily, until the pain improves (4-10 days). Be careful not to burn yourself.  Occasionally you may experience muscle spasms or cramps. These occur as a consequence of the irritation caused by the needle sticks to the muscle and the blood that will inevitably be lost into the surrounding muscle tissue. Blood tends to be very irritating to tissues, which tend to react by going into spasm. These spasms may start the same day of your procedure, but they may also take days to develop. This late onset type of spasm or cramp is usually caused by electrolyte  imbalances triggered by the steroids, at the level of the kidney. Cramps and spasms tend to respond well to muscle relaxants, multivitamins (some are triggered by the procedure, but may have their origins in vitamin deficiencies), and "Gatorade", or any sports drinks that can replenish any electrolyte imbalances. (If you are a diabetic, ask your pharmacist to get you a sugar-free brand.) Warm showers or baths may also be helpful. Stretching exercises are highly recommended.  General Instructions:  Be alert for signs of possible infection: redness, swelling, heat, red streaks, elevated temperature, and/or fever. These typically appear 4 to 6 days after the procedure. Immediately notify your doctor if you experience unusual bleeding, difficulty breathing, or loss of bowel or bladder control. If you experience increased pain, do not increase your pain medicine intake, unless instructed by your pain physician.  Post-Procedure Care:  Be careful in moving about. Muscle spasms in the area of the injection may occur. Applying ice or heat to the area is often helpful. The incidence of spinal headaches after epidural injections ranges between 1.4% and 6%. If you develop a headache that does not seem to respond to conservative therapy, please let your physician know. This can be treated with an epidural blood patch.   Post-procedure numbness or redness is to be expected, however it should average 4 to 6 hours. If numbness and weakness of your extremities begins to develop 4 to 6 hours after your procedure, and is felt to be progressing and worsening, immediately contact your physician.  Diet:  If you experience nausea, do not eat until this sensation goes away. If you had a "Stellate Ganglion Block" for upper  extremity "Reflex Sympathetic Dystrophy", do not eat or drink until your hoarseness goes away. In any case, always start with liquids first and if you tolerate them well, then slowly progress to more solid  foods.  Activity:  For the first 4 to 6 hours after the procedure, use caution in moving about as you may experience numbness and/or weakness. Use caution in cooking, using household electrical appliances, and climbing steps. If you need to reach your Doctor call our office: [phone] (During business hours) or [phone] (After business hours).  Business Hours: Monday-Thursday 8:00 am - 4:00 PM    Fridays: Closed     In case of an emergency: In case of emergency, call 911 or go to the nearest emergency room and have the physician there call us.  Interpretation of Procedure Every nerve block has two components: a diagnostic component, and a treatment component. Unrealistic expectations are the most common causes of "perceived failure".  In a perfect world, a single nerve block should be able to completely and permanently eliminate the pain. Sadly, the world is not perfect.  Most pain management nerve blocks are performed using local anesthetics and steroids. Steroids are responsible for any long-term benefit that you may experience. Their purpose is to decrease any chronic swelling that may exist in the area. Steroids begin to work immediately after being injected. However, most patients will not experience any benefits until 5 to 10 days after the injection, when the swelling has come down to the point where they can tell a difference. Steroids will only help if there is swelling to be treated. As such, they can assist with the diagnosis. If effective, they suggest an inflammatory component to the pain, and if ineffective, they rule out inflammation as the main cause or component of the problem. If the problem is one of mechanical compression, you will get no benefit from those steroids.   In the case of local anesthetics, they have a crucial role in the diagnosis of your condition. Most will begin to work within15 to 20 minutes after injection. The duration will depend on the type used  (short- vs. Long-acting). It is of outmost importance that patients keep tract of their pain, after the procedure. To assist with this matter, a "Post-procedure Pain Diary" is provided. Make sure to complete it and to bring it back to your follow-up appointment.  As long as the patient keeps accurate, detailed records of their symptoms after every procedure, and returns to have those interpreted, every procedure will provide [place] with invaluable information. Even a block that does not provide the patient with any relief, will always provide [place] with information about the mechanism and the origin of the pain. The only time a nerve block can be considered a waste of time is when patients do not keep track of the results, or do not keep their post-procedure appointment.  Reporting the results back to your physician The Pain Score  Pain is a subjective complaint. It cannot be seen, touched, or measured. We depend entirely on the patient's report of the pain in order to assess your condition and treatment. To evaluate the pain, we use a pain scale, where "0" means "No Pain", and a "10" is "the worst possible pain that you can even imagine" (i.e. something like been eaten alive by a shark or being torn apart by a lion).   Use the Pain Scale provided. You will frequently be asked to rate your pain. Please be accurate, remember  that medical decisions will be based on your responses. Please do not rate your pain above a 10. Doing so is actually interpreted as "symptom magnification" (exaggeration). To put this into perspective, when you tell us that your pain is at a 10 (ten), what you are saying is that there is nothing we can do to make this pain any worse. (Carefully think about that.) ____________________________________________________________________________________________  ____________________________________________________________________________________________  Post-Procedure Discharge  Instructions  Instructions:  Apply ice: Fill a plastic sandwich bag with crushed ice. Cover it with a small towel and apply to injection site. Apply for 15 minutes then remove x 15 minutes. Repeat sequence on day of procedure, until you go to bed. The purpose is to minimize swelling and discomfort after procedure.  Apply heat: Apply heat to procedure site starting the day following the procedure. The purpose is to treat any soreness and discomfort from the procedure.  Food intake: Start with clear liquids (like water) and advance to regular food, as tolerated.   Physical activities: Keep activities to a minimum for the first 8 hours after the procedure.   Driving: If you have received any sedation, you are not allowed to drive for 24 hours after your procedure.  Blood thinner: Restart your blood thinner 6 hours after your procedure. (Only for those taking blood thinners)  Insulin: As soon as you can eat, you may resume your normal dosing schedule. (Only for those taking insulin)  Infection prevention: Keep procedure site clean and dry.  Post-procedure Pain Diary: Extremely important that this be done correctly and accurately. Recorded information will be used to determine the next step in treatment.  Pain evaluated is that of treated area only. Do not include pain from an untreated area.  Complete every hour, on the hour, for the initial 8 hours. Set an alarm to help you do this part accurately.  Do not go to sleep and have it completed later. It will not be accurate.  Follow-up appointment: Keep your follow-up appointment after the procedure. Usually 2 weeks for most procedures. (6 weeks in the case of radiofrequency.) Bring you pain diary.   Expect:  From numbing medicine (AKA: Local Anesthetics): Numbness or decrease in pain.  Onset: Full effect within 15 minutes of injected.  Duration: It will depend on the type of local anesthetic used. On the average, 1 to 8 hours.   From  steroids: Decrease in swelling or inflammation. Once inflammation is improved, relief of the pain will follow.  Onset of benefits: Depends on the amount of swelling present. The more swelling, the longer it will take for the benefits to be seen. In some cases, up to 10 days.  Duration: Steroids will stay in the system x 2 weeks. Duration of benefits will depend on multiple posibilities including persistent irritating factors.  Occasional side-effects: Facial flushing, cramps (if present, drink Gatorade and take over-the-counter Magnesium 450-500 mg once to twice a day).  From procedure: Some discomfort is to be expected once the numbing medicine wears off. This should be minimal if ice and heat are applied as instructed.  Call if:  You experience numbness and weakness that gets worse with time, as opposed to wearing off.  New onset bowel or bladder incontinence. (This applies to Spinal procedures only)  Emergency Numbers:  [place] business hours (Monday - Thursday, 8:00 AM - 4:00 PM) (Friday, 9:00 AM - 12:00 Noon): [phone]  After hours: [phone] ____________________________________________________________________________________________

## 2018-05-04 NOTE — Progress Notes (Signed)
Safety precautions to be maintained throughout the outpatient stay will include: orient to surroundings, keep bed in low position, maintain call bell within reach at all times, provide assistance with transfer out of bed and ambulation.  

## 2018-05-05 ENCOUNTER — Telehealth: Payer: Self-pay | Admitting: *Deleted

## 2018-05-05 NOTE — Telephone Encounter (Signed)
Attempted to call for post procedure follow-up. Message left. 

## 2018-05-13 ENCOUNTER — Encounter: Payer: Self-pay | Admitting: Physician Assistant

## 2018-05-23 ENCOUNTER — Other Ambulatory Visit: Payer: Self-pay | Admitting: Family Medicine

## 2018-05-23 ENCOUNTER — Encounter: Payer: Self-pay | Admitting: Physician Assistant

## 2018-05-23 DIAGNOSIS — F3341 Major depressive disorder, recurrent, in partial remission: Secondary | ICD-10-CM

## 2018-05-23 DIAGNOSIS — F339 Major depressive disorder, recurrent, unspecified: Secondary | ICD-10-CM

## 2018-05-24 MED ORDER — VENLAFAXINE HCL ER 75 MG PO CP24: 225.0000 mg | ORAL_CAPSULE | Freq: Every day | ORAL | 1 refills | Status: DC

## 2018-06-14 ENCOUNTER — Encounter: Payer: Self-pay | Admitting: Physician Assistant

## 2018-06-14 ENCOUNTER — Ambulatory Visit (INDEPENDENT_AMBULATORY_CARE_PROVIDER_SITE_OTHER): Payer: BLUE CROSS/BLUE SHIELD | Admitting: Physician Assistant

## 2018-06-14 VITALS — BP 166/86 | HR 83 | Temp 98.2°F | Resp 16 | Wt 139.0 lb

## 2018-06-14 DIAGNOSIS — J441 Chronic obstructive pulmonary disease with (acute) exacerbation: Secondary | ICD-10-CM | POA: Diagnosis not present

## 2018-06-14 DIAGNOSIS — I10 Essential (primary) hypertension: Secondary | ICD-10-CM | POA: Diagnosis not present

## 2018-06-14 MED ORDER — DOXYCYCLINE HYCLATE 100 MG PO TABS: 100.0000 mg | ORAL_TABLET | Freq: Two times a day (BID) | ORAL | 0 refills | Status: AC

## 2018-06-14 MED ORDER — DILTIAZEM HCL ER COATED BEADS 120 MG PO CP24: 120.0000 mg | ORAL_CAPSULE | Freq: Every day | ORAL | 0 refills | Status: DC

## 2018-06-14 NOTE — Progress Notes (Signed)
Patient: SAMI Pineda Female    DOB: 1955-12-13   62 y.o.   MRN: [id number] Visit Date: 06/17/2018  Today's Provider: Trinna Post, PA-C   Chief Complaint  Patient presents with  . Hypertension   Subjective:    HPI Patient here today to follow up on her BP, reports that she was seen at [facility] on Saturday and was advised to follow up. Patient reports BP was 151/100 on Saturday.  Patient is taking Prednisone 20 mg daily x's 5 days from Saturday. She reports she has a productive cough.   She has been seen most recently by Dr. Rockey Situ in  cardiology last December for paroxysmal SVT. Her blood pressure was borderline at that time and she was instructed to monitor Bps and call back if increased, at which time Dr. Rockey Situ wrote about starting diltiazem to help with rhythm and BP.   She is not currently on any blood pressure medications. She denies chest pain and blurred vision. She denies SOB.   BP Readings from Last 3 Encounters:  06/14/18 (!) 166/86  05/04/18 (!) 153/82  04/14/18 130/80       Allergies  Allergen Reactions  . Plaquenil [Hydroxychloroquine] Rash     Current Outpatient Medications:  .  atorvastatin (LIPITOR) 40 MG tablet, TAKE 1 TABLET BY MOUTH ONCE DAILY, Disp: 90 tablet, Rfl: 3 .  b complex vitamins tablet, Take 1 tablet by mouth daily. B12, Disp: , Rfl:  .  busPIRone (BUSPAR) 15 MG tablet, Take 1 tablet (15 mg total) by mouth 3 (three) times daily., Disp: 270 tablet, Rfl: 1 .  furosemide (LASIX) 20 MG tablet, Take 1 tablet (20 mg total) by mouth daily as needed for fluid., Disp: 30 tablet, Rfl: 3 .  gabapentin (NEURONTIN) 600 MG tablet, Take 2 tablets (1,200 mg total) by mouth at bedtime., Disp: 180 tablet, Rfl: 1 .  guaiFENesin (MUCINEX) 600 MG 12 hr tablet, Take 1,200 mg by mouth 2 (two) times daily., Disp: , Rfl:  .  ibuprofen (ADVIL,MOTRIN) 200 MG tablet, Take 200 mg by mouth every 6 (six) hours as needed. For pain, Disp: , Rfl:  .   Multiple Vitamin (MULTIVITAMIN) tablet, Take 1 tablet by mouth daily.  , Disp: , Rfl:  .  Nutritional Supplements (ESTROVEN ENERGY PO), Take by mouth daily., Disp: , Rfl:  .  omeprazole (PRILOSEC) 20 MG capsule, Take 20 mg by mouth daily., Disp: , Rfl:  .  potassium chloride SA (K-DUR,KLOR-CON) 20 MEQ tablet, Take 1 tablet (20 mEq total) by mouth daily. Take with furosemide, Disp: 30 tablet, Rfl: 3 .  sertraline (ZOLOFT) 100 MG tablet, Take 1 tablet (100 mg total) by mouth daily., Disp: 90 tablet, Rfl: 1 .  topiramate (TOPAMAX) 25 MG tablet, Take 1 tablet (25 mg total) by mouth 2 (two) times daily., Disp: 60 tablet, Rfl: 5 .  traZODone (DESYREL) 50 MG tablet, Take 1-2 tablets (50-100 mg total) by mouth at bedtime., Disp: 180 tablet, Rfl: 1 .  valACYclovir (VALTREX) 1000 MG tablet, TAKE TWO TABLETS BY MOUTH TWICE DAILY FOR 5 DAYS AS NEEDED, Disp: 180 tablet, Rfl: 3 .  venlafaxine XR (EFFEXOR-XR) 75 MG 24 hr capsule, Take 3 capsules (225 mg total) by mouth daily with breakfast., Disp: 270 capsule, Rfl: 1 .  cyclobenzaprine (FLEXERIL) 10 MG tablet, Take by mouth., Disp: , Rfl:  .  diclofenac sodium (VOLTAREN) 1 % GEL, Apply topically 4 (four) times daily., Disp: , Rfl:  .  diltiazem (DILTIAZEM CD) 120 MG 24 hr capsule, Take 1 capsule (120 mg total) by mouth daily., Disp: 90 capsule, Rfl: 0 .  doxycycline (VIBRA-TABS) 100 MG tablet, Take 1 tablet (100 mg total) by mouth 2 (two) times daily for 7 days., Disp: 14 tablet, Rfl: 0 .  fluocinonide ointment (LIDEX) 4.07 %, Apply 1 application topically 2 (two) times daily., Disp: , Rfl:  .  FOLIC ACID PO, Take 10 mg tablet twice a day., Disp: , Rfl:  .  oxyCODONE (OXY IR/ROXICODONE) 5 MG immediate release tablet, TAKE 1 TABLET BY MOUTH EVERY 4 HOURS AS NEEDED FOR PAIN FORUP TO 5 DAYS, Disp: , Rfl: 0  Current Facility-Administered Medications:  .  ipratropium-albuterol (DUONEB) 0.5-2.5 (3) MG/3ML nebulizer solution 3 mL, 3 mL, Nebulization, Once, Kiki Bivens,  Maisee Vollman M, PA-C  Review of Systems  Eyes: Negative for visual disturbance.  Respiratory: Positive for cough and shortness of breath.   Cardiovascular: Negative for chest pain and leg swelling.  Neurological: Positive for headaches.    Social History   Tobacco Use  . Smoking status: Current Every Day Smoker    Packs/day: 0.50    Years: 40.00    Pack years: 20.00  . Smokeless tobacco: Never Used  Substance Use Topics  . Alcohol use: No    Comment: recovering alcoholic Since 6808    Objective:   BP (!) 166/86 (BP Location: Left Arm, Patient Position: Sitting, Cuff Size: Normal)   Pulse 83   Temp 98.2 F (36.8 C) (Oral)   Resp 16   Wt 139 lb (63 kg)   SpO2 97%   BMI 20.53 kg/m  Vitals:   06/14/18 1220  BP: (!) 166/86  Pulse: 83  Resp: 16  Temp: 98.2 F (36.8 C)  TempSrc: Oral  SpO2: 97%  Weight: 139 lb (63 kg)     Physical Exam  Constitutional: She is oriented to person, place, and time. She appears well-developed and well-nourished.  Cardiovascular: Normal rate and regular rhythm.  Pulmonary/Chest: Effort normal and breath sounds normal.  Neurological: She is alert and oriented to person, place, and time.  Skin: Skin is warm and dry.  Psychiatric: She has a normal mood and affect. Her behavior is normal.        Assessment & Plan:     1. Essential hypertension  Will start blood pressure medication as below. Will forward this record to her cardiologist as she has recently submitted medical clearance for surgery. Follow up with Tawanna Sat in a couple of weeks.   - diltiazem (DILTIAZEM CD) 120 MG 24 hr capsule; Take 1 capsule (120 mg total) by mouth daily.  Dispense: 90 capsule; Refill: 0  2. COPD exacerbation (HCC)  - doxycycline (VIBRA-TABS) 100 MG tablet; Take 1 tablet (100 mg total) by mouth 2 (two) times daily for 7 days.  Dispense: 14 tablet; Refill: 0  Return if symptoms worsen or fail to improve.  The entirety of the information documented in the  History of Present Illness, Review of Systems and Physical Exam were personally obtained by me. Portions of this information were initially documented by Lynford Humphrey, CMA and reviewed by me for thoroughness and accuracy.        Trinna Post, PA-C  [facility]

## 2018-06-15 ENCOUNTER — Encounter: Payer: Self-pay | Admitting: Student in an Organized Health Care Education/Training Program

## 2018-06-15 ENCOUNTER — Telehealth: Payer: Self-pay | Admitting: Cardiovascular Disease

## 2018-06-15 NOTE — Telephone Encounter (Signed)
° °  [facility] Pre-operative Risk Assessment    Request for surgical clearance:  1. What type of surgery is being performed? EUS   2. When is this surgery scheduled? 06-29-18  3. What type of clearance is required (medical clearance vs. Pharmacy clearance to hold med vs. Both)? both  4. Are there any medications that need to be held prior to surgery and how long? See form for review   5. Practice name and name of physician performing surgery? [facility] Dr. Su Ley   6. What is your office phone number [phone]   7.   What is your office fax number  [phone]  8.   Anesthesia type (None, local, MAC, general) ?  Not noted    Gabrielle Pineda 06/15/2018, 4:42 PM  _________________________________________________________________   (provider comments below)

## 2018-06-16 NOTE — Telephone Encounter (Signed)
I spoke with the patient.  She confirms she has not had any change in her cardiac status. She reports her only change is that her PCP recently increased her BP medication.   I advised I will forward to Dr. Rockey Situ to review and we will call her back if any further questions/ concerns.   The patient voices understanding and is agreeable.

## 2018-06-16 NOTE — Telephone Encounter (Signed)
Reviewed form with Dr. Rockey Situ. Patient last seen 07/2017. Per Dr. Rockey Situ, please call the patient to make sure she has had no changes in her cardiac status.  I left a message for the patient to call back on her identified voice mail- cell #. Home # rang busy x multiple attempts.

## 2018-06-17 NOTE — Patient Instructions (Signed)

## 2018-06-18 NOTE — Telephone Encounter (Signed)
Acceptable risk for surgery/procedure No further testing needed

## 2018-06-20 NOTE — Telephone Encounter (Signed)
Routed to number provided via EPIC fax.  

## 2018-06-21 ENCOUNTER — Ambulatory Visit
Payer: BLUE CROSS/BLUE SHIELD | Attending: Student in an Organized Health Care Education/Training Program | Admitting: Student in an Organized Health Care Education/Training Program

## 2018-06-21 ENCOUNTER — Encounter: Payer: Self-pay | Admitting: Student in an Organized Health Care Education/Training Program

## 2018-06-21 ENCOUNTER — Other Ambulatory Visit: Payer: Self-pay

## 2018-06-21 VITALS — BP 143/99 | HR 81 | Temp 98.6°F | Resp 16 | Ht 69.0 in | Wt 135.0 lb

## 2018-06-21 DIAGNOSIS — F17210 Nicotine dependence, cigarettes, uncomplicated: Secondary | ICD-10-CM | POA: Insufficient documentation

## 2018-06-21 DIAGNOSIS — E539 Vitamin B deficiency, unspecified: Secondary | ICD-10-CM | POA: Insufficient documentation

## 2018-06-21 DIAGNOSIS — M5030 Other cervical disc degeneration, unspecified cervical region: Secondary | ICD-10-CM | POA: Insufficient documentation

## 2018-06-21 DIAGNOSIS — G894 Chronic pain syndrome: Secondary | ICD-10-CM | POA: Diagnosis not present

## 2018-06-21 DIAGNOSIS — K219 Gastro-esophageal reflux disease without esophagitis: Secondary | ICD-10-CM | POA: Diagnosis not present

## 2018-06-21 DIAGNOSIS — M19049 Primary osteoarthritis, unspecified hand: Secondary | ICD-10-CM | POA: Diagnosis not present

## 2018-06-21 DIAGNOSIS — J449 Chronic obstructive pulmonary disease, unspecified: Secondary | ICD-10-CM | POA: Insufficient documentation

## 2018-06-21 DIAGNOSIS — M542 Cervicalgia: Secondary | ICD-10-CM | POA: Diagnosis not present

## 2018-06-21 DIAGNOSIS — F329 Major depressive disorder, single episode, unspecified: Secondary | ICD-10-CM | POA: Insufficient documentation

## 2018-06-21 DIAGNOSIS — G629 Polyneuropathy, unspecified: Secondary | ICD-10-CM | POA: Insufficient documentation

## 2018-06-21 DIAGNOSIS — I471 Supraventricular tachycardia: Secondary | ICD-10-CM | POA: Diagnosis not present

## 2018-06-21 DIAGNOSIS — I10 Essential (primary) hypertension: Secondary | ICD-10-CM | POA: Insufficient documentation

## 2018-06-21 DIAGNOSIS — M47812 Spondylosis without myelopathy or radiculopathy, cervical region: Secondary | ICD-10-CM | POA: Insufficient documentation

## 2018-06-21 DIAGNOSIS — E7800 Pure hypercholesterolemia, unspecified: Secondary | ICD-10-CM | POA: Insufficient documentation

## 2018-06-21 DIAGNOSIS — Z79899 Other long term (current) drug therapy: Secondary | ICD-10-CM | POA: Insufficient documentation

## 2018-06-21 DIAGNOSIS — E785 Hyperlipidemia, unspecified: Secondary | ICD-10-CM | POA: Insufficient documentation

## 2018-06-21 MED ORDER — GABAPENTIN 600 MG PO TABS: ORAL_TABLET | ORAL | 3 refills | Status: DC

## 2018-06-21 NOTE — Progress Notes (Signed)
Safety precautions to be maintained throughout the outpatient stay will include: orient to surroundings, keep bed in low position, maintain call bell within reach at all times, provide assistance with transfer out of bed and ambulation.  

## 2018-06-21 NOTE — Progress Notes (Signed)
Patient's Name: Gabrielle Pineda  MRN: [id number]  Referring Provider: [name], [place]*  DOB: 09/07/1955  PCP: [name], PA-C  DOS: 06/21/2018  Note by: [name], MD  Service setting: Ambulatory outpatient  Specialty: Interventional Pain Management  Location: [facility] (AMB) Pain Management Facility    Patient type: Established   Primary Reason(s) for Visit: Encounter for post-procedure evaluation of chronic illness with mild to moderate exacerbation CC: Neck Pain  HPI  [name] is a 62 y.o. year old, female patient, who comes today for a post-procedure evaluation. She has Hyperlipidemia; SMOKER; Paroxysmal supraventricular tachycardia ([place]); CHEST PAIN UNSPECIFIED; ELECTROCARDIOGRAM, ABNORMAL; Shortness of breath; Closed fracture of distal phalanx of thumb; Acid reflux; Arthritis, degenerative; Arthritis or polyarthritis, rheumatoid ([place]); B-complex deficiency; CN (constipation); Clinical depression; Family history of malignant neoplasm of pancreas; Cold sore; H/O alcohol abuse; Hypercholesteremia; Disorder of iron metabolism; Cannot sleep; Localized osteoarthrosis, hand; Menopausal symptom; Weight loss; Delayed onset of urination; COPD (chronic obstructive pulmonary disease) ([place]); Closed compression fracture of L5 lumbar vertebra; Sacral insufficiency fracture with routine healing; Essential hypertension; Cervical radiculopathy; Neuropathy; and History of adenomatous polyp of colon on their problem list. Her primarily concern today is the Neck Pain  Pain Assessment: Location: Mid Neck Radiating: radiates into right shoudler Onset: More than a month ago Duration: Chronic pain Quality: Nagging, Constant Severity: 8 /10 (subjective, self-reported pain score)  Note: Reported level is compatible with observation.                         When using our objective Pain Scale, levels between 6 and 10/10 are said to belong in an emergency room, as it progressively worsens from a  6/10, described as severely limiting, requiring emergency care not usually available at an outpatient pain management facility. At a 6/10 level, communication becomes difficult and requires great effort. Assistance to reach the emergency department may be required. Facial flushing and profuse sweating along with potentially dangerous increases in heart rate and blood pressure will be evident. Effect on ADL: "I cant do alot" Timing: Constant Modifying factors: injections BP: (!) 143/99  HR: 81  [name] comes in today for post-procedure evaluation.  Further details on both, my assessment(s), as well as the proposed treatment plan, please see below.  Post-Procedure Assessment  05/04/2018 Procedure: Right C4-C5-C6-C7 cervical facet medial branch block Pre-procedure pain score:  9/10 Post-procedure pain score: 0/10         Influential Factors: BMI: 19.94 kg/m Intra-procedural challenges: None observed.         Assessment challenges: None detected.              Reported side-effects: None.        Post-procedural adverse reactions or complications: None reported         Sedation: Please see nurses note. When no sedatives are used, the analgesic levels obtained are directly associated to the effectiveness of the local anesthetics. However, when sedation is provided, the level of analgesia obtained during the initial 1 hour following the intervention, is believed to be the result of a combination of factors. These factors may include, but are not limited to: 1. The effectiveness of the local anesthetics used. 2. The effects of the analgesic(s) and/or anxiolytic(s) used. 3. The degree of discomfort experienced by the patient at the time of the procedure. 4. The patients ability and reliability in recalling and recording the events. 5. The presence and influence of possible secondary gains  and/or psychosocial factors. Reported result: Relief experienced during the 1st hour after the procedure:  100 % (Ultra-Short Term Relief)            Interpretative annotation: Clinically appropriate result. Analgesia during this period is likely to be Local Anesthetic and/or IV Sedative (Analgesic/Anxiolytic) related.          Effects of local anesthetic: The analgesic effects attained during this period are directly associated to the localized infiltration of local anesthetics and therefore cary significant diagnostic value as to the etiological location, or anatomical origin, of the pain. Expected duration of relief is directly dependent on the pharmacodynamics of the local anesthetic used. Long-acting (4-6 hours) anesthetics used.  Reported result: Relief during the next 4 to 6 hour after the procedure: 100 % (Short-Term Relief)            Interpretative annotation: Clinically appropriate result. Analgesia during this period is likely to be Local Anesthetic-related.          Long-term benefit: Defined as the period of time past the expected duration of local anesthetics (1 hour for short-acting and 4-6 hours for long-acting). With the possible exception of prolonged sympathetic blockade from the local anesthetics, benefits during this period are typically attributed to, or associated with, other factors such as analgesic sensory neuropraxia, antiinflammatory effects, or beneficial biochemical changes provided by agents other than the local anesthetics.  Reported result: Extended relief following procedure: 50 % (Long-Term Relief)            Interpretative annotation: Clinically possible results. Good relief. No permanent benefit expected. Inflammation plays a part in the etiology to the pain.          Current benefits: Defined as reported results that persistent at this point in time.   Analgesia: 25-50 %            Function: Somewhat improved ROM: Somewhat improved Interpretative annotation: Recurrence of symptoms. No permanent benefit expected. Effective diagnostic intervention.           Interpretation: Results would suggest a successful diagnostic intervention.                  Plan:  Repeat treatment or therapy and compare extent and duration of benefits.                Laboratory Chemistry  Inflammation Markers (CRP: Acute Phase) (ESR: Chronic Phase) No results found for: CRP, ESRSEDRATE, LATICACIDVEN                       Rheumatology Markers No results found for: RF, ANA, LABURIC, URICUR, LYMEIGGIGMAB, LYMEABIGMQN, HLAB27                      Renal Function Markers Lab Results  Component Value Date   BUN 5 (L) 04/14/2018   CREATININE 0.71 04/14/2018   BCR 7 (L) 04/14/2018   GFRAA 106 04/14/2018   GFRNONAA 92 04/14/2018                             Hepatic Function Markers Lab Results  Component Value Date   AST 20 04/14/2018   ALT 14 04/14/2018   ALBUMIN 4.2 04/14/2018   ALKPHOS 107 04/14/2018   LIPASE 63 03/23/2018                        Electrolytes Lab Results  Component Value Date   NA 140 04/14/2018   K 3.8 04/14/2018   CL 105 04/14/2018   CALCIUM 9.4 04/14/2018                        Neuropathy Markers Lab Results  Component Value Date   HGBA1C 5.0 04/14/2018                        CNS Tests No results found for: COLORCSF, APPEARCSF, RBCCOUNTCSF, WBCCSF, POLYSCSF, LYMPHSCSF, EOSCSF, PROTEINCSF, GLUCCSF, JCVIRUS, CSFOLI, IGGCSF                      Bone Pathology Markers No results found for: VD25OH, [id number], [id number], KG2542HC6, 25OHVITD1, 25OHVITD2, 25OHVITD3, TESTOFREE, TESTOSTERONE                       Coagulation Parameters Lab Results  Component Value Date   PLT 268 04/14/2018                        Cardiovascular Markers Lab Results  Component Value Date   HGB 12.2 04/14/2018   HCT 35.8 04/14/2018                         CA Markers No results found for: CEA, CA125, LABCA2                      Note: Lab results reviewed.  Recent Diagnostic Imaging Results  DG C-Arm 1-60 Min-No Report Fluoroscopy was  utilized by the requesting physician.  No radiographic  interpretation.   Complexity Note: Imaging results reviewed. Results shared with Ms. Roda Shutters, using Layman's terms.                         Meds   Current Outpatient Medications:  .  atorvastatin (LIPITOR) 40 MG tablet, TAKE 1 TABLET BY MOUTH ONCE DAILY, Disp: 90 tablet, Rfl: 3 .  b complex vitamins tablet, Take 1 tablet by mouth daily. B12, Disp: , Rfl:  .  busPIRone (BUSPAR) 15 MG tablet, Take 1 tablet (15 mg total) by mouth 3 (three) times daily., Disp: 270 tablet, Rfl: 1 .  diltiazem (DILTIAZEM CD) 120 MG 24 hr capsule, Take 1 capsule (120 mg total) by mouth daily., Disp: 90 capsule, Rfl: 0 .  fluocinonide ointment (LIDEX) 2.37 %, Apply 1 application topically 2 (two) times daily., Disp: , Rfl:  .  furosemide (LASIX) 20 MG tablet, Take 1 tablet (20 mg total) by mouth daily as needed for fluid., Disp: 30 tablet, Rfl: 3 .  gabapentin (NEURONTIN) 600 MG tablet, 600 mg 3 times daily.  Okay to increase nighttime dose to 1200 mg., Disp: 120 tablet, Rfl: 3 .  guaiFENesin (MUCINEX) 600 MG 12 hr tablet, Take 1,200 mg by mouth 2 (two) times daily., Disp: , Rfl:  .  ibuprofen (ADVIL,MOTRIN) 200 MG tablet, Take 200 mg by mouth every 6 (six) hours as needed. For pain, Disp: , Rfl:  .  Nutritional Supplements (ESTROVEN ENERGY PO), Take by mouth daily., Disp: , Rfl:  .  omeprazole (PRILOSEC) 20 MG capsule, Take 20 mg by mouth daily., Disp: , Rfl:  .  potassium chloride SA (K-DUR,KLOR-CON) 20 MEQ tablet, Take 1 tablet (20 mEq total) by mouth daily. Take with furosemide, Disp: 30 tablet, Rfl: 3 .  sertraline (ZOLOFT) 100 MG tablet, Take 1 tablet (100 mg total) by mouth daily., Disp: 90 tablet, Rfl: 1 .  topiramate (TOPAMAX) 25 MG tablet, Take 1 tablet (25 mg total) by mouth 2 (two) times daily., Disp: 60 tablet, Rfl: 5 .  traZODone (DESYREL) 50 MG tablet, Take 1-2 tablets (50-100 mg total) by mouth at bedtime., Disp: 180 tablet, Rfl: 1 .   valACYclovir (VALTREX) 1000 MG tablet, TAKE TWO TABLETS BY MOUTH TWICE DAILY FOR 5 DAYS AS NEEDED, Disp: 180 tablet, Rfl: 3 .  venlafaxine XR (EFFEXOR-XR) 75 MG 24 hr capsule, Take 3 capsules (225 mg total) by mouth daily with breakfast., Disp: 270 capsule, Rfl: 1 .  cyclobenzaprine (FLEXERIL) 10 MG tablet, Take by mouth., Disp: , Rfl:  .  diclofenac sodium (VOLTAREN) 1 % GEL, Apply topically 4 (four) times daily., Disp: , Rfl:  .  doxycycline (VIBRA-TABS) 100 MG tablet, Take 1 tablet (100 mg total) by mouth 2 (two) times daily for 7 days. (Patient not taking: Reported on 06/21/2018), Disp: 14 tablet, Rfl: 0 .  FOLIC ACID PO, Take 10 mg tablet twice a day., Disp: , Rfl:  .  Multiple Vitamin (MULTIVITAMIN) tablet, Take 1 tablet by mouth daily.  , Disp: , Rfl:  .  oxyCODONE (OXY IR/ROXICODONE) 5 MG immediate release tablet, TAKE 1 TABLET BY MOUTH EVERY 4 HOURS AS NEEDED FOR PAIN FORUP TO 5 DAYS, Disp: , Rfl: 0  Current Facility-Administered Medications:  .  ipratropium-albuterol (DUONEB) 0.5-2.5 (3) MG/3ML nebulizer solution 3 mL, 3 mL, Nebulization, Once, Pollak, Adriana M, PA-C  ROS  Constitutional: Denies any fever or chills Gastrointestinal: No reported hemesis, hematochezia, vomiting, or acute GI distress Musculoskeletal: Denies any acute onset joint swelling, redness, loss of ROM, or weakness Neurological: No reported episodes of acute onset apraxia, aphasia, dysarthria, agnosia, amnesia, paralysis, loss of coordination, or loss of consciousness  Allergies  Ms. Hooser is allergic to plaquenil [hydroxychloroquine].  [place]  Drug: Ms. Szwed  reports that she does not use drugs. Alcohol:  reports that she does not drink alcohol. Tobacco:  reports that she has been smoking. She has a 20.00 pack-year smoking history. She has never used smokeless tobacco. Medical:  has a past medical history of Alcohol abuse, Anemia, Arthritis, Cervicalgia, COPD (chronic obstructive pulmonary disease)  ([place]), Depression, GERD (gastroesophageal reflux disease), Heart murmur, Other and unspecified hyperlipidemia, and Tachycardia. Surgical: Ms. Rama  has a past surgical history that includes neck disc surgery; Breast enhancement surgery (1981); Esophagogastroduodenoscopy; Tonsillectomy and adenoidectomy (1973 ); Carpal tunnel release (11/11/2011); Carpal tunnel release (2013); Rotator cuff repair (06/16/2016); Rotator cuff repair; Colonoscopy with propofol (N/A, 11/01/2017); and Augmentation mammaplasty (Bilateral, 1982). Family: family history includes Alcohol abuse in her father and mother; Bipolar disorder in her mother; Pancreatic cancer in her father; Suicidality in her mother.  Constitutional Exam  General appearance: Well nourished, well developed, and well hydrated. In no apparent acute distress Vitals:   06/21/18 1210  BP: (!) 143/99  Pulse: 81  Resp: 16  Temp: 98.6 F (37 C)  SpO2: 98%  Weight: 135 lb (61.2 kg)  Height: _0  (1.753 m)   BMI Assessment: Estimated body mass index is 19.94 kg/m as calculated from the following:   Height as of this encounter: _1  (1.753 m).   Weight as of this encounter: 135 lb (61.2 kg).  BMI interpretation table: BMI level Category Range association with higher incidence of chronic pain  <18 kg/m2 Underweight   18.5-24.9 kg/m2 Ideal body weight  25-29.9 kg/m2 Overweight Increased incidence by 20%  30-34.9 kg/m2 Obese (Class I) Increased incidence by 68%  35-39.9 kg/m2 Severe obesity (Class II) Increased incidence by 136%  >40 kg/m2 Extreme obesity (Class III) Increased incidence by 254%   Patient's current BMI Ideal Body weight  Body mass index is 19.94 kg/m. Ideal body weight: 66.2 kg (145 lb 15.1 oz)   BMI Readings from Last 4 Encounters:  06/21/18 19.94 kg/m  06/14/18 20.53 kg/m  05/04/18 19.94 kg/m  04/14/18 20.38 kg/m   Wt Readings from Last 4 Encounters:  06/21/18 135 lb (61.2 kg)  06/14/18 139 lb (63 kg)  05/04/18  135 lb (61.2 kg)  04/14/18 138 lb (62.6 kg)  Psych/Mental status: Alert, oriented x 3 (person, place, & time)       Eyes: PERLA Respiratory: No evidence of acute respiratory distress  Cervical Spine Area Exam  Skin & Axial Inspection: Well healed scar from previous spine surgery detected Alignment: Symmetrical Functional ROM: Decreased ROM      Stability: No instability detected Muscle Tone/Strength: Functionally intact. No obvious neuro-muscular anomalies detected. Sensory (Neurological): Musculoskeletal pain pattern Palpation: Complains of area being tender to palpation Positive provocative maneuver for for cervical facet disease on the right  Upper Extremity (UE) Exam    Side: Right upper extremity  Side: Left upper extremity  Skin & Extremity Inspection: Skin color, temperature, and hair growth are WNL. No peripheral edema or cyanosis. No masses, redness, swelling, asymmetry, or associated skin lesions. No contractures.  Skin & Extremity Inspection: Skin color, temperature, and hair growth are WNL. No peripheral edema or cyanosis. No masses, redness, swelling, asymmetry, or associated skin lesions. No contractures.  Functional ROM: Unrestricted ROM          Functional ROM: Unrestricted ROM          Muscle Tone/Strength: Functionally intact. No obvious neuro-muscular anomalies detected.  Muscle Tone/Strength: Functionally intact. No obvious neuro-muscular anomalies detected.  Sensory (Neurological): Unimpaired          Sensory (Neurological): Unimpaired          Palpation: No palpable anomalies              Palpation: No palpable anomalies              Provocative Test(s):  Phalen's test: deferred Tinel's test: deferred Apley's scratch test (touch opposite shoulder):  Action 1 (Across chest): deferred Action 2 (Overhead): deferred Action 3 (LB reach): deferred   Provocative Test(s):  Phalen's test: deferred Tinel's test: deferred Apley's scratch test (touch opposite shoulder):    Action 1 (Across chest): deferred Action 2 (Overhead): deferred Action 3 (LB reach): deferred    Thoracic Spine Area Exam  Skin & Axial Inspection: No masses, redness, or swelling Alignment: Symmetrical Functional ROM: Unrestricted ROM Stability: No instability detected Muscle Tone/Strength: Functionally intact. No obvious neuro-muscular anomalies detected. Sensory (Neurological): Unimpaired Muscle strength & Tone: No palpable anomalies  Lumbar Spine Area Exam  Skin & Axial Inspection: No masses, redness, or swelling Alignment: Symmetrical Functional ROM: Unrestricted ROM       Stability: No instability detected Muscle Tone/Strength: Functionally intact. No obvious neuro-muscular anomalies detected. Sensory (Neurological): Unimpaired Palpation: No palpable anomalies       Provocative Tests: Hyperextension/rotation test: deferred today       Lumbar quadrant test (Kemp's test): deferred today       Lateral bending test: deferred today       Patrick's Maneuver: deferred today  FABER test: deferred today                   S-I anterior distraction/compression test: deferred today         S-I lateral compression test: deferred today         S-I Thigh-thrust test: deferred today         S-I Gaenslen's test: deferred today          Gait & Posture Assessment  Ambulation: Unassisted Gait: Relatively normal for age and body habitus Posture: WNL   Lower Extremity Exam    Side: Right lower extremity  Side: Left lower extremity  Stability: No instability observed          Stability: No instability observed          Skin & Extremity Inspection: Skin color, temperature, and hair growth are WNL. No peripheral edema or cyanosis. No masses, redness, swelling, asymmetry, or associated skin lesions. No contractures.  Skin & Extremity Inspection: Skin color, temperature, and hair growth are WNL. No peripheral edema or cyanosis. No masses, redness, swelling, asymmetry, or  associated skin lesions. No contractures.  Functional ROM: Unrestricted ROM                  Functional ROM: Unrestricted ROM                  Muscle Tone/Strength: Functionally intact. No obvious neuro-muscular anomalies detected.  Muscle Tone/Strength: Functionally intact. No obvious neuro-muscular anomalies detected.  Sensory (Neurological): Unimpaired  Sensory (Neurological): Unimpaired  Palpation: No palpable anomalies  Palpation: No palpable anomalies   Assessment  Primary Diagnosis & Pertinent Problem List: The primary encounter diagnosis was Spondylosis of cervical region without myelopathy or radiculopathy. Diagnoses of DDD (degenerative disc disease), cervical, Cervicalgia, Chronic pain syndrome, and Neuropathy were also pertinent to this visit.  Status Diagnosis  Persistent Persistent Persistent 1. Spondylosis of cervical region without myelopathy or radiculopathy   2. DDD (degenerative disc disease), cervical   3. Cervicalgia   4. Chronic pain syndrome   5. Neuropathy     General Recommendations: The pain condition that the patient suffers from is best treated with a multidisciplinary approach that involves an increase in physical activity to prevent de-conditioning and worsening of the pain cycle, as well as psychological counseling (formal and/or informal) to address the co-morbid psychological affects of pain. Treatment will often involve judicious use of pain medications and interventional procedures to decrease the pain, allowing the patient to participate in the physical activity that will ultimately produce long-lasting pain reductions. The goal of the multidisciplinary approach is to return the patient to a higher level of overall function and to restore their ability to perform activities of daily living.  62 year old female with a history of cervical spondylosis who follows up status post right C4, C5, C6, C7 cervical facet medial branch nerve block #2.  Patient endorses  benefit in her pain as well as range of motion after the block.  She is endorsing ongoing benefit is approximately 25 to 30%.  Patient was very tearful during our encounter today.  She states that her disability claim has been denied for the second time.  She is attempting to hire an attorney for appeal.  We discussed repeating cervical facet medial branch nerve block in the next 3 to 4 weeks.  Regards to medication management, I recommended the patient increase her gabapentin to 600 mill grams 3 times a day to stay on this dose for  2 to 3 weeks.  She is not having any side effect she can increase her nighttime dose to 1200 mg nightly.  No further dose escalation beyond this.  We will avoid opioids given history of alcohol abuse in the past and patient's preference to stay away from these medications.  Plan: -Repeat right C4, C5, C6, C7 cervical facet medial branch nerve block #3 -Increase gabapentin as below  Plan of Care  Pharmacotherapy (Medications Ordered): Meds ordered this encounter  Medications  . gabapentin (NEURONTIN) 600 MG tablet    Sig: 600 mg 3 times daily.  Okay to increase nighttime dose to 1200 mg.    Dispense:  120 tablet    Refill:  3   Lab-work, procedure(s), and/or referral(s): Orders Placed This Encounter  Procedures  . CERVICAL FACET (MEDIAL BRANCH NERVE BLOCK)    Time Note: Greater than 50% of the 25 minute(s) of face-to-face time spent with Ms. Consolo, was spent in counseling/coordination of care regarding: Ms. Coombs primary cause of pain, the treatment plan, treatment alternatives, the risks and possible complications of proposed treatment, going over the informed consent, the results, interpretation and significance of  her recent diagnostic interventional treatment(s), realistic expectations and the goals of pain management (increased in functionality).  Provider-requested follow-up: Return in about 4 weeks (around 07/19/2018) for Procedure.  Future  Appointments  Date Time Provider [place]  06/27/2018 11:40 AM [name], PA-C [facility] None  07/18/2018 11:00 AM [name], MD [facility] None  07/28/2018  2:40 PM Gollan, Kathlene November, MD [id number]    Primary Care Physician: Rubye Beach Location: [facility] Out[facility] Facility Note by: [name], M.D Date: 06/21/2018; Time: 2:28 PM  Patient Instructions   Gabapentin has been escribed to your pharmacy.  GENERAL RISKS AND COMPLICATIONS  What are the risk, side effects and possible complications? Generally speaking, most procedures are safe.  However, with any procedure there are risks, side effects, and the possibility of complications.  The risks and complications are dependent upon the sites that are lesioned, or the type of nerve block to be performed.  The closer the procedure is to the spine, the more serious the risks are.  Great care is taken when placing the radio frequency needles, block needles or lesioning probes, but sometimes complications can occur. 1. Infection: Any time there is an injection through the skin, there is a risk of infection.  This is why sterile conditions are used for these blocks.  There are four possible types of infection. 1. Localized skin infection. 2. Central Nervous System Infection-This can be in the form of Meningitis, which can be deadly. 3. Epidural Infections-This can be in the form of an epidural abscess, which can cause pressure inside of the spine, causing compression of the spinal cord with subsequent paralysis. This would require an emergency surgery to decompress, and there are no guarantees that the patient would recover from the paralysis. 4. Discitis-This is an infection of the intervertebral discs.  It occurs in about 1% of discography procedures.  It is difficult to treat and it may lead to surgery.        2. Pain: the needles have to go through skin and soft tissues, will cause  soreness.       3. Damage to internal structures:  The nerves to be lesioned may be near blood vessels or    other nerves which can be potentially damaged.       4. Bleeding: Bleeding  is more common if the patient is taking blood thinners such as  aspirin, Coumadin, Ticiid, Plavix, etc., or if he/she have some genetic predisposition  such as hemophilia. Bleeding into the spinal canal can cause compression of the spinal  cord with subsequent paralysis.  This would require an emergency surgery to  decompress and there are no guarantees that the patient would recover from the  paralysis.       5. Pneumothorax:  Puncturing of a lung is a possibility, every time a needle is introduced in  the area of the chest or upper back.  Pneumothorax refers to free air around the  collapsed lung(s), inside of the thoracic cavity (chest cavity).  Another two possible  complications related to a similar event would include: Hemothorax and Chylothorax.   These are variations of the Pneumothorax, where instead of air around the collapsed  lung(s), you may have blood or chyle, respectively.       6. Spinal headaches: They may occur with any procedures in the area of the spine.       7. Persistent CSF (Cerebro-Spinal Fluid) leakage: This is a rare problem, but may occur  with prolonged intrathecal or epidural catheters either due to the formation of a fistulous  track or a dural tear.       8. Nerve damage: By working so close to the spinal cord, there is always a possibility of  nerve damage, which could be as serious as a permanent spinal cord injury with  paralysis.       9. Death:  Although rare, severe deadly allergic reactions known as "Anaphylactic  reaction" can occur to any of the medications used.      10. Worsening of the symptoms:  We can always make thing worse.  What are the chances of something like this happening? Chances of any of this occuring are extremely low.  By statistics, you have more of a chance of  getting killed in a motor vehicle accident: while driving to the hospital than any of the above occurring .  Nevertheless, you should be aware that they are possibilities.  In general, it is similar to taking a shower.  Everybody knows that you can slip, hit your head and get killed.  Does that mean that you should not shower again?  Nevertheless always keep in mind that statistics do not mean anything if you happen to be on the wrong side of them.  Even if a procedure has a 1 (one) in a 1,000,000 (million) chance of going wrong, it you happen to be that one..Also, keep in mind that by statistics, you have more of a chance of having something go wrong when taking medications.  Who should not have this procedure? If you are on a blood thinning medication (e.g. Coumadin, Plavix, see list of "Blood Thinners"), or if you have an active infection going on, you should not have the procedure.  If you are taking any blood thinners, please inform your physician.  How should I prepare for this procedure?  Do not eat or drink anything at least six hours prior to the procedure.  Bring a driver with you .  It cannot be a taxi.  Come accompanied by an adult that can drive you back, and that is strong enough to help you if your legs get weak or numb from the local anesthetic.  Take all of your medicines the morning of the procedure with just enough water to swallow them.  If you have diabetes, make sure that you are scheduled to have your procedure done first thing in the morning, whenever possible.  If you have diabetes, take only half of your insulin dose and notify our nurse that you have done so as soon as you arrive at the clinic.  If you are diabetic, but only take blood sugar pills (oral hypoglycemic), then do not take them on the morning of your procedure.  You may take them after you have had the procedure.  Do not take aspirin or any aspirin-containing medications, at least eleven (11) days prior to  the procedure.  They may prolong bleeding.  Wear loose fitting clothing that may be easy to take off and that you would not mind if it got stained with Betadine or blood.  Do not wear any jewelry or perfume  Remove any nail coloring.  It will interfere with some of our monitoring equipment.  NOTE: Remember that this is not meant to be interpreted as a complete list of all possible complications.  Unforeseen problems may occur.  BLOOD THINNERS The following drugs contain aspirin or other products, which can cause increased bleeding during surgery and should not be taken for 2 weeks prior to and 1 week after surgery.  If you should need take something for relief of minor pain, you may take acetaminophen which is found in Tylenol,m Datril, Anacin-3 and Panadol. It is not blood thinner. The products listed below are.  Do not take any of the products listed below in addition to any listed on your instruction sheet.  A.P.C or A.P.C with Codeine Codeine Phosphate Capsules #3 Ibuprofen Ridaura  ABC compound Congesprin Imuran rimadil  Advil Cope Indocin Robaxisal  Alka-Seltzer Effervescent Pain Reliever and Antacid Coricidin or Coricidin-D  Indomethacin Rufen  Alka-Seltzer plus Cold Medicine Cosprin Ketoprofen S-A-C Tablets  Anacin Analgesic Tablets or Capsules Coumadin Korlgesic Salflex  Anacin Extra Strength Analgesic tablets or capsules CP-2 Tablets Lanoril Salicylate  Anaprox Cuprimine Capsules Levenox Salocol  Anexsia-D Dalteparin Magan Salsalate  Anodynos Darvon compound Magnesium Salicylate Sine-off  Ansaid Dasin Capsules Magsal Sodium Salicylate  Anturane Depen Capsules Marnal Soma  APF Arthritis pain formula Dewitt's Pills Measurin Stanback  Argesic Dia-Gesic Meclofenamic Sulfinpyrazone  Arthritis Bayer Timed Release Aspirin Diclofenac Meclomen Sulindac  Arthritis pain formula Anacin Dicumarol Medipren Supac  Analgesic (Safety coated) Arthralgen Diffunasal Mefanamic Suprofen  Arthritis  Strength Bufferin Dihydrocodeine Mepro Compound Suprol  Arthropan liquid Dopirydamole Methcarbomol with Aspirin Synalgos  ASA tablets/Enseals Disalcid Micrainin Tagament  Ascriptin Doan's Midol Talwin  Ascriptin A/D Dolene Mobidin Tanderil  Ascriptin Extra Strength Dolobid Moblgesic Ticlid  Ascriptin with Codeine Doloprin or Doloprin with Codeine Momentum Tolectin  Asperbuf Duoprin Mono-gesic Trendar  Aspergum Duradyne Motrin or Motrin IB Triminicin  Aspirin plain, buffered or enteric coated Durasal Myochrisine Trigesic  Aspirin Suppositories Easprin Nalfon Trillsate  Aspirin with Codeine Ecotrin Regular or Extra Strength Naprosyn Uracel  Atromid-S Efficin Naproxen Ursinus  Auranofin Capsules Elmiron Neocylate Vanquish  Axotal Emagrin Norgesic Verin  Azathioprine Empirin or Empirin with Codeine Normiflo Vitamin E  Azolid Emprazil Nuprin Voltaren  Bayer Aspirin plain, buffered or children's or timed BC Tablets or powders Encaprin Orgaran Warfarin Sodium  Buff-a-Comp Enoxaparin Orudis Zorpin  Buff-a-Comp with Codeine Equegesic Os-Cal-Gesic   Buffaprin Excedrin plain, buffered or Extra Strength Oxalid   Bufferin Arthritis Strength Feldene Oxphenbutazone   Bufferin plain or Extra Strength Feldene Capsules Oxycodone with Aspirin   Bufferin with Codeine Fenoprofen Fenoprofen Pabalate or Pabalate-SF   Buffets II  Flogesic Panagesic   Buffinol plain or Extra Strength Florinal or Florinal with Codeine Panwarfarin   Buf-Tabs Flurbiprofen Penicillamine   Butalbital Compound Four-way cold tablets Penicillin   Butazolidin Fragmin Pepto-Bismol   Carbenicillin Geminisyn Percodan   Carna Arthritis Reliever Geopen Persantine   Carprofen Gold's salt Persistin   Chloramphenicol Goody's Phenylbutazone   Chloromycetin Haltrain Piroxlcam   Clmetidine heparin Plaquenil   Cllnoril Hyco-pap Ponstel   Clofibrate Hydroxy chloroquine Propoxyphen         Before stopping any of these medications, be sure to  consult the physician who ordered them.  Some, such as Coumadin (Warfarin) are ordered to prevent or treat serious conditions such as "deep thrombosis", "pumonary embolisms", and other heart problems.  The amount of time that you may need off of the medication may also vary with the medication and the reason for which you were taking it.  If you are taking any of these medications, please make sure you notify your pain physician before you undergo any procedures.   Moderate Conscious Sedation, Adult Sedation is the use of medicines to promote relaxation and relieve discomfort and anxiety. Moderate conscious sedation is a type of sedation. Under moderate conscious sedation, you are less alert than normal, but you are still able to respond to instructions, touch, or both. Moderate conscious sedation is used during short medical and dental procedures. It is milder than deep sedation, which is a type of sedation under which you cannot be easily woken up. It is also milder than general anesthesia, which is the use of medicines to make you unconscious. Moderate conscious sedation allows you to return to your regular activities sooner. Tell a health care provider about:  Any allergies you have.  All medicines you are taking, including vitamins, herbs, eye drops, creams, and over-the-counter medicines.  Use of steroids (by mouth or creams).  Any problems you or family members have had with sedatives and anesthetic medicines.  Any blood disorders you have.  Any surgeries you have had.  Any medical conditions you have, such as sleep apnea.  Whether you are pregnant or may be pregnant.  Any use of cigarettes, alcohol, marijuana, or street drugs. What are the risks? Generally, this is a safe procedure. However, problems may occur, including:  Getting too much medicine (oversedation).  Nausea.  Allergic reaction to medicines.  Trouble breathing. If this happens, a breathing tube may be used to  help with breathing. It will be removed when you are awake and breathing on your own.  Heart trouble.  Lung trouble.  What happens before the procedure? Staying hydrated Follow instructions from your health care provider about hydration, which may include:  Up to 2 hours before the procedure - you may continue to drink clear liquids, such as water, clear fruit juice, black coffee, and plain tea.  Eating and drinking restrictions Follow instructions from your health care provider about eating and drinking, which may include:  8 hours before the procedure - stop eating heavy meals or foods such as meat, fried foods, or fatty foods.  6 hours before the procedure - stop eating light meals or foods, such as toast or cereal.  6 hours before the procedure - stop drinking milk or drinks that contain milk.  2 hours before the procedure - stop drinking clear liquids.  Medicine  Ask your health care provider about:  Changing or stopping your regular medicines. This is especially important if you are taking diabetes medicines or blood thinners.  Taking  medicines such as aspirin and ibuprofen. These medicines can thin your blood. Do not take these medicines before your procedure if your health care provider instructs you not to.  Tests and exams  You will have a physical exam.  You may have blood tests done to show: ? How well your kidneys and liver are working. ? How well your blood can clot. General instructions  Plan to have someone take you home from the hospital or clinic.  If you will be going home right after the procedure, plan to have someone with you for 24 hours. What happens during the procedure?  An IV tube will be inserted into one of your veins.  Medicine to help you relax (sedative) will be given through the IV tube.  The medical or dental procedure will be performed. What happens after the procedure?  Your blood pressure, heart rate, breathing rate, and blood  oxygen level will be monitored often until the medicines you were given have worn off.  Do not drive for 24 hours. This information is not intended to replace advice given to you by your health care provider. Make sure you discuss any questions you have with your health care provider. Document Released: 04/28/2001 Document Revised: 01/07/2016 Document Reviewed: 11/23/2015 Elsevier Interactive Patient Education  2018 [facility] [employer] Patient Information  Description: The facets are joints in the spine between the vertebrae.  Like any joints in the body, facets can become irritated and painful.  Arthritis can also effect the facets.  By injecting steroids and local anesthetic in and around these joints, we can temporarily block the nerve supply to them.  Steroids act directly on irritated nerves and tissues to reduce selling and inflammation which often leads to decreased pain.  Facet blocks may be done anywhere along the spine from the neck to the low back depending upon the location of your pain.   After numbing the skin with local anesthetic (like Novocaine), a small needle is passed onto the facet joints under x-ray guidance.  You may experience a sensation of pressure while this is being done.  The entire block usually lasts about 15-25 minutes.   Conditions which may be treated by facet blocks:   Low back/buttock pain  Neck/shoulder pain  Certain types of headaches  Preparation for the injection:  1. Do not eat any solid food or dairy products within 8 hours of your appointment. 2. You may drink clear liquid up to 3 hours before appointment.  Clear liquids include water, black coffee, juice or soda.  No milk or cream please. 3. You may take your regular medication, including pain medications, with a sip of water before your appointment.  Diabetics should hold regular insulin (if taken separately) and take 1/2 normal NPH dose the morning of the procedure.  Carry some sugar  containing items with you to your appointment. 4. A driver must accompany you and be prepared to drive you home after your procedure. 5. Bring all your current medications with you. 6. An IV may be inserted and sedation may be given at the discretion of the physician. 7. A blood pressure cuff, EKG and other monitors will often be applied during the procedure.  Some patients may need to have extra oxygen administered for a short period. 8. You will be asked to provide medical information, including your allergies and medications, prior to the procedure.  We must know immediately if you are taking blood thinners (like Coumadin/Warfarin) or if you are allergic  to IV iodine contrast (dye).  We must know if you could possible be pregnant.  Possible side-effects:   Bleeding from needle site  Infection (rare, may require surgery)  Nerve injury (rare)  Numbness & tingling (temporary)  Difficulty urinating (rare, temporary)  Spinal headache (a headache worse with upright posture)  Light-headedness (temporary)  Pain at injection site (serveral days)  Decreased blood pressure (rare, temporary)  Weakness in arm/leg (temporary)  Pressure sensation in back/neck (temporary)   Call if you experience:   Fever/chills associated with headache or increased back/neck pain  Headache worsened by an upright position  New onset, weakness or numbness of an extremity below the injection site  Hives or difficulty breathing (go to the emergency room)  Inflammation or drainage at the injection site(s)  Severe back/neck pain greater than usual  New symptoms which are concerning to you  Please note:  Although the local anesthetic injected can often make your back or neck feel good for several hours after the injection, the pain will likely return. It takes 3-7 days for steroids to work.  You may not notice any pain relief for at least one week.  If effective, we will often do a series of 2-3  injections spaced 3-6 weeks apart to maximally decrease your pain.  After the initial series, you may be a candidate for a more permanent nerve block of the facets.  If you have any questions, please call #[phone]) [facility]

## 2018-06-21 NOTE — Patient Instructions (Addendum)
Gabapentin has been escribed to your pharmacy.  GENERAL RISKS AND COMPLICATIONS  What are the risk, side effects and possible complications? Generally speaking, most procedures are safe.  However, with any procedure there are risks, side effects, and the possibility of complications.  The risks and complications are dependent upon the sites that are lesioned, or the type of nerve block to be performed.  The closer the procedure is to the spine, the more serious the risks are.  Great care is taken when placing the radio frequency needles, block needles or lesioning probes, but sometimes complications can occur. 1. Infection: Any time there is an injection through the skin, there is a risk of infection.  This is why sterile conditions are used for these blocks.  There are four possible types of infection. 1. Localized skin infection. 2. Central Nervous System Infection-This can be in the form of Meningitis, which can be deadly. 3. Epidural Infections-This can be in the form of an epidural abscess, which can cause pressure inside of the spine, causing compression of the spinal cord with subsequent paralysis. This would require an emergency surgery to decompress, and there are no guarantees that the patient would recover from the paralysis. 4. Discitis-This is an infection of the intervertebral discs.  It occurs in about 1% of discography procedures.  It is difficult to treat and it may lead to surgery.        2. Pain: the needles have to go through skin and soft tissues, will cause soreness.       3. Damage to internal structures:  The nerves to be lesioned may be near blood vessels or    other nerves which can be potentially damaged.       4. Bleeding: Bleeding is more common if the patient is taking blood thinners such as  aspirin, Coumadin, Ticiid, Plavix, etc., or if he/she have some genetic predisposition  such as hemophilia. Bleeding into the spinal canal can cause compression of the spinal  cord  with subsequent paralysis.  This would require an emergency surgery to  decompress and there are no guarantees that the patient would recover from the  paralysis.       5. Pneumothorax:  Puncturing of a lung is a possibility, every time a needle is introduced in  the area of the chest or upper back.  Pneumothorax refers to free air around the  collapsed lung(s), inside of the thoracic cavity (chest cavity).  Another two possible  complications related to a similar event would include: Hemothorax and Chylothorax.   These are variations of the Pneumothorax, where instead of air around the collapsed  lung(s), you may have blood or chyle, respectively.       6. Spinal headaches: They may occur with any procedures in the area of the spine.       7. Persistent CSF (Cerebro-Spinal Fluid) leakage: This is a rare problem, but may occur  with prolonged intrathecal or epidural catheters either due to the formation of a fistulous  track or a dural tear.       8. Nerve damage: By working so close to the spinal cord, there is always a possibility of  nerve damage, which could be as serious as a permanent spinal cord injury with  paralysis.       9. Death:  Although rare, severe deadly allergic reactions known as "Anaphylactic  reaction" can occur to any of the medications used.      10. Worsening of the  symptoms:  We can always make thing worse.  What are the chances of something like this happening? Chances of any of this occuring are extremely low.  By statistics, you have more of a chance of getting killed in a motor vehicle accident: while driving to the hospital than any of the above occurring .  Nevertheless, you should be aware that they are possibilities.  In general, it is similar to taking a shower.  Everybody knows that you can slip, hit your head and get killed.  Does that mean that you should not shower again?  Nevertheless always keep in mind that statistics do not mean anything if you happen to be on the  wrong side of them.  Even if a procedure has a 1 (one) in a 1,000,000 (million) chance of going wrong, it you happen to be that one..Also, keep in mind that by statistics, you have more of a chance of having something go wrong when taking medications.  Who should not have this procedure? If you are on a blood thinning medication (e.g. Coumadin, Plavix, see list of "Blood Thinners"), or if you have an active infection going on, you should not have the procedure.  If you are taking any blood thinners, please inform your physician.  How should I prepare for this procedure?  Do not eat or drink anything at least six hours prior to the procedure.  Bring a driver with you .  It cannot be a taxi.  Come accompanied by an adult that can drive you back, and that is strong enough to help you if your legs get weak or numb from the local anesthetic.  Take all of your medicines the morning of the procedure with just enough water to swallow them.  If you have diabetes, make sure that you are scheduled to have your procedure done first thing in the morning, whenever possible.  If you have diabetes, take only half of your insulin dose and notify our nurse that you have done so as soon as you arrive at the clinic.  If you are diabetic, but only take blood sugar pills (oral hypoglycemic), then do not take them on the morning of your procedure.  You may take them after you have had the procedure.  Do not take aspirin or any aspirin-containing medications, at least eleven (11) days prior to the procedure.  They may prolong bleeding.  Wear loose fitting clothing that may be easy to take off and that you would not mind if it got stained with Betadine or blood.  Do not wear any jewelry or perfume  Remove any nail coloring.  It will interfere with some of our monitoring equipment.  NOTE: Remember that this is not meant to be interpreted as a complete list of all possible complications.  Unforeseen problems may  occur.  BLOOD THINNERS The following drugs contain aspirin or other products, which can cause increased bleeding during surgery and should not be taken for 2 weeks prior to and 1 week after surgery.  If you should need take something for relief of minor pain, you may take acetaminophen which is found in Tylenol,m Datril, Anacin-3 and Panadol. It is not blood thinner. The products listed below are.  Do not take any of the products listed below in addition to any listed on your instruction sheet.  A.P.C or A.P.C with Codeine Codeine Phosphate Capsules #3 Ibuprofen Ridaura  ABC compound Congesprin Imuran rimadil  Advil Cope Indocin Robaxisal  Alka-Seltzer Effervescent Pain Reliever  and Antacid Coricidin or Coricidin-D  Indomethacin Rufen  Alka-Seltzer plus Cold Medicine Cosprin Ketoprofen S-A-C Tablets  Anacin Analgesic Tablets or Capsules Coumadin Korlgesic Salflex  Anacin Extra Strength Analgesic tablets or capsules CP-2 Tablets Lanoril Salicylate  Anaprox Cuprimine Capsules Levenox Salocol  Anexsia-D Dalteparin Magan Salsalate  Anodynos Darvon compound Magnesium Salicylate Sine-off  Ansaid Dasin Capsules Magsal Sodium Salicylate  Anturane Depen Capsules Marnal Soma  APF Arthritis pain formula Dewitt's Pills Measurin Stanback  Argesic Dia-Gesic Meclofenamic Sulfinpyrazone  Arthritis Bayer Timed Release Aspirin Diclofenac Meclomen Sulindac  Arthritis pain formula Anacin Dicumarol Medipren Supac  Analgesic (Safety coated) Arthralgen Diffunasal Mefanamic Suprofen  Arthritis Strength Bufferin Dihydrocodeine Mepro Compound Suprol  Arthropan liquid Dopirydamole Methcarbomol with Aspirin Synalgos  ASA tablets/Enseals Disalcid Micrainin Tagament  Ascriptin Doan's Midol Talwin  Ascriptin A/D Dolene Mobidin Tanderil  Ascriptin Extra Strength Dolobid Moblgesic Ticlid  Ascriptin with Codeine Doloprin or Doloprin with Codeine Momentum Tolectin  Asperbuf Duoprin Mono-gesic Trendar  Aspergum Duradyne  Motrin or Motrin IB Triminicin  Aspirin plain, buffered or enteric coated Durasal Myochrisine Trigesic  Aspirin Suppositories Easprin Nalfon Trillsate  Aspirin with Codeine Ecotrin Regular or Extra Strength Naprosyn Uracel  Atromid-S Efficin Naproxen Ursinus  Auranofin Capsules Elmiron Neocylate Vanquish  Axotal Emagrin Norgesic Verin  Azathioprine Empirin or Empirin with Codeine Normiflo Vitamin E  Azolid Emprazil Nuprin Voltaren  Bayer Aspirin plain, buffered or children's or timed BC Tablets or powders Encaprin Orgaran Warfarin Sodium  Buff-a-Comp Enoxaparin Orudis Zorpin  Buff-a-Comp with Codeine Equegesic Os-Cal-Gesic   Buffaprin Excedrin plain, buffered or Extra Strength Oxalid   Bufferin Arthritis Strength Feldene Oxphenbutazone   Bufferin plain or Extra Strength Feldene Capsules Oxycodone with Aspirin   Bufferin with Codeine Fenoprofen Fenoprofen Pabalate or Pabalate-SF   Buffets II Flogesic Panagesic   Buffinol plain or Extra Strength Florinal or Florinal with Codeine Panwarfarin   Buf-Tabs Flurbiprofen Penicillamine   Butalbital Compound Four-way cold tablets Penicillin   Butazolidin Fragmin Pepto-Bismol   Carbenicillin Geminisyn Percodan   Carna Arthritis Reliever Geopen Persantine   Carprofen Gold's salt Persistin   Chloramphenicol Goody's Phenylbutazone   Chloromycetin Haltrain Piroxlcam   Clmetidine heparin Plaquenil   Cllnoril Hyco-pap Ponstel   Clofibrate Hydroxy chloroquine Propoxyphen         Before stopping any of these medications, be sure to consult the physician who ordered them.  Some, such as Coumadin (Warfarin) are ordered to prevent or treat serious conditions such as "deep thrombosis", "pumonary embolisms", and other heart problems.  The amount of time that you may need off of the medication may also vary with the medication and the reason for which you were taking it.  If you are taking any of these medications, please make sure you notify your pain  physician before you undergo any procedures.   Moderate Conscious Sedation, Adult Sedation is the use of medicines to promote relaxation and relieve discomfort and anxiety. Moderate conscious sedation is a type of sedation. Under moderate conscious sedation, you are less alert than normal, but you are still able to respond to instructions, touch, or both. Moderate conscious sedation is used during short medical and dental procedures. It is milder than deep sedation, which is a type of sedation under which you cannot be easily woken up. It is also milder than general anesthesia, which is the use of medicines to make you unconscious. Moderate conscious sedation allows you to return to your regular activities sooner. Tell a health care provider about:  Any allergies you have.  All  medicines you are taking, including vitamins, herbs, eye drops, creams, and over-the-counter medicines.  Use of steroids (by mouth or creams).  Any problems you or family members have had with sedatives and anesthetic medicines.  Any blood disorders you have.  Any surgeries you have had.  Any medical conditions you have, such as sleep apnea.  Whether you are pregnant or may be pregnant.  Any use of cigarettes, alcohol, marijuana, or street drugs. What are the risks? Generally, this is a safe procedure. However, problems may occur, including:  Getting too much medicine (oversedation).  Nausea.  Allergic reaction to medicines.  Trouble breathing. If this happens, a breathing tube may be used to help with breathing. It will be removed when you are awake and breathing on your own.  Heart trouble.  Lung trouble.  What happens before the procedure? Staying hydrated Follow instructions from your health care provider about hydration, which may include:  Up to 2 hours before the procedure - you may continue to drink clear liquids, such as water, clear fruit juice, black coffee, and plain tea.  Eating and  drinking restrictions Follow instructions from your health care provider about eating and drinking, which may include:  8 hours before the procedure - stop eating heavy meals or foods such as meat, fried foods, or fatty foods.  6 hours before the procedure - stop eating light meals or foods, such as toast or cereal.  6 hours before the procedure - stop drinking milk or drinks that contain milk.  2 hours before the procedure - stop drinking clear liquids.  Medicine  Ask your health care provider about:  Changing or stopping your regular medicines. This is especially important if you are taking diabetes medicines or blood thinners.  Taking medicines such as aspirin and ibuprofen. These medicines can thin your blood. Do not take these medicines before your procedure if your health care provider instructs you not to.  Tests and exams  You will have a physical exam.  You may have blood tests done to show: ? How well your kidneys and liver are working. ? How well your blood can clot. General instructions  Plan to have someone take you home from the hospital or clinic.  If you will be going home right after the procedure, plan to have someone with you for 24 hours. What happens during the procedure?  An IV tube will be inserted into one of your veins.  Medicine to help you relax (sedative) will be given through the IV tube.  The medical or dental procedure will be performed. What happens after the procedure?  Your blood pressure, heart rate, breathing rate, and blood oxygen level will be monitored often until the medicines you were given have worn off.  Do not drive for 24 hours. This information is not intended to replace advice given to you by your health care provider. Make sure you discuss any questions you have with your health care provider. Document Released: 04/28/2001 Document Revised: 01/07/2016 Document Reviewed: 11/23/2015 Elsevier Interactive Patient Education   2018 [facility] Patient Information  Description: The facets are joints in the spine between the vertebrae.  Like any joints in the body, facets can become irritated and painful.  Arthritis can also effect the facets.  By injecting steroids and local anesthetic in and around these joints, we can temporarily block the nerve supply to them.  Steroids act directly on irritated nerves and tissues to reduce selling and inflammation which often leads  to decreased pain.  Facet blocks may be done anywhere along the spine from the neck to the low back depending upon the location of your pain.   After numbing the skin with local anesthetic (like Novocaine), a small needle is passed onto the facet joints under x-ray guidance.  You may experience a sensation of pressure while this is being done.  The entire block usually lasts about 15-25 minutes.   Conditions which may be treated by facet blocks:   Low back/buttock pain  Neck/shoulder pain  Certain types of headaches  Preparation for the injection:  1. Do not eat any solid food or dairy products within 8 hours of your appointment. 2. You may drink clear liquid up to 3 hours before appointment.  Clear liquids include water, black coffee, juice or soda.  No milk or cream please. 3. You may take your regular medication, including pain medications, with a sip of water before your appointment.  Diabetics should hold regular insulin (if taken separately) and take 1/2 normal NPH dose the morning of the procedure.  Carry some sugar containing items with you to your appointment. 4. A driver must accompany you and be prepared to drive you home after your procedure. 5. Bring all your current medications with you. 6. An IV may be inserted and sedation may be given at the discretion of the physician. 7. A blood pressure cuff, EKG and other monitors will often be applied during the procedure.  Some patients may need to have extra oxygen administered  for a short period. 8. You will be asked to provide medical information, including your allergies and medications, prior to the procedure.  We must know immediately if you are taking blood thinners (like Coumadin/Warfarin) or if you are allergic to IV iodine contrast (dye).  We must know if you could possible be pregnant.  Possible side-effects:   Bleeding from needle site  Infection (rare, may require surgery)  Nerve injury (rare)  Numbness & tingling (temporary)  Difficulty urinating (rare, temporary)  Spinal headache (a headache worse with upright posture)  Light-headedness (temporary)  Pain at injection site (serveral days)  Decreased blood pressure (rare, temporary)  Weakness in arm/leg (temporary)  Pressure sensation in back/neck (temporary)   Call if you experience:   Fever/chills associated with headache or increased back/neck pain  Headache worsened by an upright position  New onset, weakness or numbness of an extremity below the injection site  Hives or difficulty breathing (go to the emergency room)  Inflammation or drainage at the injection site(s)  Severe back/neck pain greater than usual  New symptoms which are concerning to you  Please note:  Although the local anesthetic injected can often make your back or neck feel good for several hours after the injection, the pain will likely return. It takes 3-7 days for steroids to work.  You may not notice any pain relief for at least one week.  If effective, we will often do a series of 2-3 injections spaced 3-6 weeks apart to maximally decrease your pain.  After the initial series, you may be a candidate for a more permanent nerve block of the facets.  If you have any questions, please call #[phone]) [facility]

## 2018-06-23 ENCOUNTER — Telehealth: Payer: Self-pay | Admitting: *Deleted

## 2018-06-27 ENCOUNTER — Encounter: Payer: Self-pay | Admitting: Physician Assistant

## 2018-06-27 ENCOUNTER — Ambulatory Visit (INDEPENDENT_AMBULATORY_CARE_PROVIDER_SITE_OTHER): Payer: BLUE CROSS/BLUE SHIELD | Admitting: Physician Assistant

## 2018-06-27 VITALS — BP 139/85 | HR 106 | Temp 97.8°F | Wt 123.8 lb

## 2018-06-27 DIAGNOSIS — J0140 Acute pansinusitis, unspecified: Secondary | ICD-10-CM | POA: Diagnosis not present

## 2018-06-27 DIAGNOSIS — F411 Generalized anxiety disorder: Secondary | ICD-10-CM

## 2018-06-27 DIAGNOSIS — I10 Essential (primary) hypertension: Secondary | ICD-10-CM

## 2018-06-27 MED ORDER — BUSPIRONE HCL 30 MG PO TABS: 30.0000 mg | ORAL_TABLET | Freq: Three times a day (TID) | ORAL | 1 refills | Status: DC

## 2018-06-27 MED ORDER — DOXYCYCLINE HYCLATE 100 MG PO TABS: 100.0000 mg | ORAL_TABLET | Freq: Two times a day (BID) | ORAL | 0 refills | Status: DC

## 2018-06-27 NOTE — Progress Notes (Signed)
Patient: Gabrielle Pineda Female    DOB: 01-Dec-1955   62 y.o.   MRN: [id number] Visit Date: 06/27/2018  Today's Provider: [name], PA-C   Chief Complaint  Patient presents with  . Hypertension   Subjective:    HPI  Hypertension, follow-up:  BP Readings from Last 3 Encounters:  06/27/18 139/85  06/21/18 (!) 143/99  06/14/18 (!) 166/86    She was last seen for hypertension 2 weeks ago.  BP at that visit was 139/85. Management since that visit includes Diltazem She reports good compliance with treatment. She is not having side effects.  She is not exercising. She is adherent to low salt diet.   Outside blood pressures are 150-160/92-100. She is experiencing palpitations.  Patient denies chest pain, chest pressure/discomfort, claudication, dyspnea, exertional chest pressure/discomfort, fatigue, irregular heart beat, lower extremity edema and tachypnea.   Cardiovascular risk factors include hypertension.  Use of agents associated with hypertension: none.    Weight trend: stable Wt Readings from Last 3 Encounters:  06/27/18 123 lb 12.8 oz (56.2 kg)  06/21/18 135 lb (61.2 kg)  06/14/18 139 lb (63 kg)   Current diet: well balanced, low salt  ------------------------------------------------------------------------     Allergies  Allergen Reactions  . Plaquenil [Hydroxychloroquine] Rash     Current Outpatient Medications:  .  atorvastatin (LIPITOR) 40 MG tablet, TAKE 1 TABLET BY MOUTH ONCE DAILY, Disp: 90 tablet, Rfl: 3 .  b complex vitamins tablet, Take 1 tablet by mouth daily. B12, Disp: , Rfl:  .  busPIRone (BUSPAR) 15 MG tablet, Take 1 tablet (15 mg total) by mouth 3 (three) times daily., Disp: 270 tablet, Rfl: 1 .  cyclobenzaprine (FLEXERIL) 10 MG tablet, Take by mouth., Disp: , Rfl:  .  diclofenac sodium (VOLTAREN) 1 % GEL, Apply topically 4 (four) times daily., Disp: , Rfl:  .  diltiazem (DILTIAZEM CD) 120 MG 24 hr capsule, Take 1 capsule  (120 mg total) by mouth daily., Disp: 90 capsule, Rfl: 0 .  fluocinonide ointment (LIDEX) 6.01 %, Apply 1 application topically 2 (two) times daily., Disp: , Rfl:  .  FOLIC ACID PO, Take 10 mg tablet twice a day., Disp: , Rfl:  .  furosemide (LASIX) 20 MG tablet, Take 1 tablet (20 mg total) by mouth daily as needed for fluid., Disp: 30 tablet, Rfl: 3 .  gabapentin (NEURONTIN) 600 MG tablet, 600 mg 3 times daily.  Okay to increase nighttime dose to 1200 mg., Disp: 120 tablet, Rfl: 3 .  guaiFENesin (MUCINEX) 600 MG 12 hr tablet, Take 1,200 mg by mouth 2 (two) times daily., Disp: , Rfl:  .  ibuprofen (ADVIL,MOTRIN) 200 MG tablet, Take 200 mg by mouth every 6 (six) hours as needed. For pain, Disp: , Rfl:  .  Multiple Vitamin (MULTIVITAMIN) tablet, Take 1 tablet by mouth daily.  , Disp: , Rfl:  .  Nutritional Supplements (ESTROVEN ENERGY PO), Take by mouth daily., Disp: , Rfl:  .  omeprazole (PRILOSEC) 20 MG capsule, Take 20 mg by mouth daily., Disp: , Rfl:  .  oxyCODONE (OXY IR/ROXICODONE) 5 MG immediate release tablet, TAKE 1 TABLET BY MOUTH EVERY 4 HOURS AS NEEDED FOR PAIN FORUP TO 5 DAYS, Disp: , Rfl: 0 .  potassium chloride SA (K-DUR,KLOR-CON) 20 MEQ tablet, Take 1 tablet (20 mEq total) by mouth daily. Take with furosemide, Disp: 30 tablet, Rfl: 3 .  sertraline (ZOLOFT) 100 MG tablet, Take 1 tablet (100 mg total) by  mouth daily., Disp: 90 tablet, Rfl: 1 .  topiramate (TOPAMAX) 25 MG tablet, Take 1 tablet (25 mg total) by mouth 2 (two) times daily., Disp: 60 tablet, Rfl: 5 .  traZODone (DESYREL) 50 MG tablet, Take 1-2 tablets (50-100 mg total) by mouth at bedtime., Disp: 180 tablet, Rfl: 1 .  valACYclovir (VALTREX) 1000 MG tablet, TAKE TWO TABLETS BY MOUTH TWICE DAILY FOR 5 DAYS AS NEEDED, Disp: 180 tablet, Rfl: 3 .  venlafaxine XR (EFFEXOR-XR) 75 MG 24 hr capsule, Take 3 capsules (225 mg total) by mouth daily with breakfast., Disp: 270 capsule, Rfl: 1  Current Facility-Administered Medications:  .   ipratropium-albuterol (DUONEB) 0.5-2.5 (3) MG/3ML nebulizer solution 3 mL, 3 mL, Nebulization, Once, Pollak, Adriana M, PA-C  Review of Systems  Constitutional: Negative.   HENT: Negative.   Respiratory: Negative.   Gastrointestinal: Negative.   Genitourinary: Negative.     Social History   Tobacco Use  . Smoking status: Current Every Day Smoker    Packs/day: 0.50    Years: 40.00    Pack years: 20.00  . Smokeless tobacco: Never Used  Substance Use Topics  . Alcohol use: No    Comment: recovering alcoholic Since 0272    Objective:   BP 139/85 (BP Location: Left Arm, Patient Position: Sitting, Cuff Size: Normal)   Pulse (!) 106   Temp 97.8 F (36.6 C) (Oral)   Wt 123 lb 12.8 oz (56.2 kg)   SpO2 97%   BMI 18.28 kg/m  Vitals:   06/27/18 1146  BP: 139/85  Pulse: (!) 106  Temp: 97.8 F (36.6 C)  TempSrc: Oral  SpO2: 97%  Weight: 123 lb 12.8 oz (56.2 kg)     Physical Exam  Constitutional: She appears well-developed and well-nourished. No distress.  HENT:  Head: Normocephalic and atraumatic.  Right Ear: Hearing, tympanic membrane, external ear and ear canal normal.  Left Ear: Hearing, tympanic membrane, external ear and ear canal normal.  Nose: Right sinus exhibits maxillary sinus tenderness and frontal sinus tenderness. Left sinus exhibits maxillary sinus tenderness and frontal sinus tenderness.  Mouth/Throat: Uvula is midline, oropharynx is clear and moist and mucous membranes are normal. No oropharyngeal exudate.  Neck: Normal range of motion. Neck supple. No tracheal deviation present. No thyromegaly present.  Cardiovascular: Normal rate, regular rhythm and normal heart sounds. Exam reveals no gallop and no friction rub.  No murmur heard. Pulmonary/Chest: Effort normal and breath sounds normal. No stridor. No respiratory distress. She has no wheezes. She has no rales.  Musculoskeletal: She exhibits no edema.  Lymphadenopathy:    She has no cervical adenopathy.    Skin: She is not diaphoretic.  Psychiatric: Her speech is normal and behavior is normal. Judgment and thought content normal. Cognition and memory are normal. She exhibits a depressed mood (tearful when discussing disability case).  Vitals reviewed.      Assessment & Plan:     1. Essential hypertension BP doing better with increase in diltiazem. Continue diltiazem XR 120mg  daily.   2. Acute non-recurrent pansinusitis Worsening symptoms that have not responded to OTC medications. Will give doxycycline as below. Continue allergy medications. Stay well hydrated and get plenty of rest. Call if no symptom improvement or if symptoms worsen. - doxycycline (VIBRA-TABS) 100 MG tablet; Take 1 tablet (100 mg total) by mouth 2 (two) times daily.  Dispense: 20 tablet; Refill: 0  3. GAD (generalized anxiety disorder) Worsening due to having disability case denied again. Will increase Buspar to 30mg   TID as below. Call if worsening still. - busPIRone (BUSPAR) 30 MG tablet; Take 1 tablet (30 mg total) by mouth 3 (three) times daily.  Dispense: 90 tablet; Refill: Staples, PA-C  [facility]

## 2018-06-30 ENCOUNTER — Encounter: Payer: Self-pay | Admitting: Physician Assistant

## 2018-07-18 ENCOUNTER — Ambulatory Visit
Admission: RE | Admit: 2018-07-18 | Discharge: 2018-07-18 | Disposition: A | Discharge status: Home / Self Care | Payer: BLUE CROSS/BLUE SHIELD | Source: Ambulatory Visit | Attending: Student in an Organized Health Care Education/Training Program | Admitting: Student in an Organized Health Care Education/Training Program

## 2018-07-18 ENCOUNTER — Ambulatory Visit (HOSPITAL_BASED_OUTPATIENT_CLINIC_OR_DEPARTMENT_OTHER): Payer: BLUE CROSS/BLUE SHIELD | Admitting: Student in an Organized Health Care Education/Training Program

## 2018-07-18 ENCOUNTER — Encounter: Payer: Self-pay | Admitting: Student in an Organized Health Care Education/Training Program

## 2018-07-18 VITALS — BP 151/81 | HR 80 | Temp 97.6°F | Resp 16 | Ht 69.0 in | Wt 145.0 lb

## 2018-07-18 DIAGNOSIS — M47812 Spondylosis without myelopathy or radiculopathy, cervical region: Secondary | ICD-10-CM | POA: Insufficient documentation

## 2018-07-18 MED ORDER — FENTANYL CITRATE (PF) 100 MCG/2ML IJ SOLN
INTRAMUSCULAR | Status: AC
  Filled 2018-07-18: qty 2

## 2018-07-18 MED ORDER — ROPIVACAINE HCL 2 MG/ML IJ SOLN
10.0000 mL | Freq: Once | INTRAMUSCULAR | Status: AC
  Administered 2018-07-18: 10 mL

## 2018-07-18 MED ORDER — ROPIVACAINE HCL 2 MG/ML IJ SOLN
INTRAMUSCULAR | Status: AC
  Filled 2018-07-18: qty 10

## 2018-07-18 MED ORDER — DEXAMETHASONE SODIUM PHOSPHATE 10 MG/ML IJ SOLN
10.0000 mg | Freq: Once | INTRAMUSCULAR | Status: AC
  Administered 2018-07-18: 10 mg

## 2018-07-18 MED ORDER — LACTATED RINGERS IV SOLN
1000.0000 mL | Freq: Once | INTRAVENOUS | Status: AC
  Administered 2018-07-18: 1000 mL via INTRAVENOUS

## 2018-07-18 MED ORDER — DEXAMETHASONE SODIUM PHOSPHATE 10 MG/ML IJ SOLN
INTRAMUSCULAR | Status: AC
  Filled 2018-07-18: qty 1

## 2018-07-18 MED ORDER — LIDOCAINE HCL 2 % IJ SOLN
INTRAMUSCULAR | Status: AC
  Filled 2018-07-18: qty 20

## 2018-07-18 MED ORDER — LIDOCAINE HCL 2 % IJ SOLN
20.0000 mL | Freq: Once | INTRAMUSCULAR | Status: AC
  Administered 2018-07-18: 400 mg

## 2018-07-18 MED ORDER — FENTANYL CITRATE (PF) 100 MCG/2ML IJ SOLN
25.0000 ug | INTRAMUSCULAR | Status: DC | PRN
  Administered 2018-07-18: 100 ug via INTRAVENOUS

## 2018-07-18 NOTE — Patient Instructions (Signed)

## 2018-07-18 NOTE — Progress Notes (Signed)
For for patient's Name: Gabrielle Pineda  MRN: [id number]  Referring Provider: [name], [place]*  DOB: Feb 11, 1956  PCP: [name], PA-C  DOS: 07/18/2018  Note by: [name], MD  Service setting: Ambulatory outpatient  Specialty: Interventional Pain Management  Patient type: Established  Location: [facility] (AMB) Pain Management Facility  Visit type: Interventional Procedure   Primary Reason for Visit: Interventional Pain Management Treatment. GY:IRSW pain  Procedure:       Anesthesia, Analgesia, Anxiolysis:  Type: Cervical Facet Medial Branch Block(s) #3  Primary Purpose: Diagnostic Region: Posterolateral cervical spine Level: C4, C5, C6, & C7 Medial Branch Level(s). Injecting these levels blocks the C4-5, C5-6, and C6-7 cervical facet joints. Laterality: Right-Sided Paraspinal  Type: Moderate (Conscious) Sedation combined with Local Anesthesia Indication(s): Analgesia and Anxiety Route: Intravenous (IV) IV Access: Secured Sedation: Meaningful verbal contact was maintained at all times during the procedure  Local Anesthetic: Lidocaine 1-2%   Indications: 1. Spondylosis of cervical region without myelopathy or radiculopathy    Pain Score: Pre-procedure: 9 /10 Post-procedure: 0-No pain/10  Pre-op Assessment:  Gabrielle Pineda is a 62 y.o. (year old), female patient, seen today for interventional treatment. She  has a past surgical history that includes neck disc surgery; Breast enhancement surgery (1981); Esophagogastroduodenoscopy; Tonsillectomy and adenoidectomy (1973 ); Carpal tunnel release (11/11/2011); Carpal tunnel release (2013); Rotator cuff repair (06/16/2016); Rotator cuff repair; Colonoscopy with propofol (N/A, 11/01/2017); and Augmentation mammaplasty (Bilateral, 1982). Ms. Desantiago has a current medication list which includes the following prescription(s): atorvastatin, b complex vitamins, buspirone, cyclobenzaprine, diclofenac sodium, diltiazem, diltiazem,  fluocinonide ointment, folic acid, furosemide, gabapentin, guaifenesin, ibuprofen, leflunomide, multivitamin, misc natural products, omeprazole, oxycodone, potassium chloride sa, sertraline, topiramate, trazodone, valacyclovir, venlafaxine xr, xiidra, and doxycycline, and the following Facility-Administered Medications: fentanyl and ipratropium-albuterol. Her primarily concern today is the Neck Pain (middle )  Initial Vital Signs:  Pulse/HCG Rate: 81ECG Heart Rate: 77 Temp: 97.8 F (36.6 C) Resp: 16 BP: 128/70 SpO2: 97 %  BMI: Estimated body mass index is 21.41 kg/m as calculated from the following:   Height as of this encounter: 5\' 9"  (1.753 m).   Weight as of this encounter: 145 lb (65.8 kg).  Risk Assessment: Allergies: Reviewed. She is allergic to plaquenil [hydroxychloroquine].  Allergy Precautions: None required Coagulopathies: Reviewed. None identified.  Blood-thinner therapy: None at this time Active Infection(s): Reviewed. None identified. Gabrielle Pineda is afebrile  Site Confirmation: Gabrielle Pineda was asked to confirm the procedure and laterality before marking the site Procedure checklist: Completed Consent: Before the procedure and under the influence of no sedative(s), amnesic(s), or anxiolytics, the patient was informed of the treatment options, risks and possible complications. To fulfill our ethical and legal obligations, as recommended by the American Medical Association's Code of Ethics, I have informed the patient of my clinical impression; the nature and purpose of the treatment or procedure; the risks, benefits, and possible complications of the intervention; the alternatives, including doing nothing; the risk(s) and benefit(s) of the alternative treatment(s) or procedure(s); and the risk(s) and benefit(s) of doing nothing. The patient was provided information about the general risks and possible complications associated with the procedure. These may include, but are not  limited to: failure to achieve desired goals, infection, bleeding, organ or nerve damage, allergic reactions, paralysis, and death. In addition, the patient was informed of those risks and complications associated to Spine-related procedures, such as failure to decrease pain; infection (i.e.: Meningitis, epidural or intraspinal abscess); bleeding (i.e.: epidural hematoma, subarachnoid hemorrhage,  or any other type of intraspinal or peri-dural bleeding); organ or nerve damage (i.e.: Any type of peripheral nerve, nerve root, or spinal cord injury) with subsequent damage to sensory, motor, and/or autonomic systems, resulting in permanent pain, numbness, and/or weakness of one or several areas of the body; allergic reactions; (i.e.: anaphylactic reaction); and/or death. Furthermore, the patient was informed of those risks and complications associated with the medications. These include, but are not limited to: allergic reactions (i.e.: anaphylactic or anaphylactoid reaction(s)); adrenal axis suppression; blood sugar elevation that in diabetics may result in ketoacidosis or comma; water retention that in patients with history of congestive heart failure may result in shortness of breath, pulmonary edema, and decompensation with resultant heart failure; weight gain; swelling or edema; medication-induced neural toxicity; particulate matter embolism and blood vessel occlusion with resultant organ, and/or nervous system infarction; and/or aseptic necrosis of one or more joints. Finally, the patient was informed that Medicine is not an exact science; therefore, there is also the possibility of unforeseen or unpredictable risks and/or possible complications that may result in a catastrophic outcome. The patient indicated having understood very clearly. We have given the patient no guarantees and we have made no promises. Enough time was given to the patient to ask questions, all of which were answered to the patient's  satisfaction. [name] has indicated that she wanted to continue with the procedure. Attestation: I, the ordering provider, attest that I have discussed with the patient the benefits, risks, side-effects, alternatives, likelihood of achieving goals, and potential problems during recovery for the procedure that I have provided informed consent. Date  Time:   Pre-Procedure Preparation:  Monitoring: As per clinic protocol. Respiration, ETCO2, SpO2, BP, heart rate and rhythm monitor placed and checked for adequate function Safety Precautions: Patient was assessed for positional comfort and pressure points before starting the procedure. Time-out: I initiated and conducted the "Time-out" before starting the procedure, as per protocol. The patient was asked to participate by confirming the accuracy of the "Time Out" information. Verification of the correct person, site, and procedure were performed and confirmed by me, the nursing staff, and the patient. "Time-out" conducted as per Joint Commission's Universal Protocol (UP.01.01.01). Time: 1115  Description of Procedure:       Position: Prone with head of the table raised to facilitate breathing. Laterality: Right Level: C4, C5, C6, & C7 Medial Branch Level(s). Area Prepped: Posterior Cervico-thoracic Region Prepping solution: ChloraPrep (2% chlorhexidine gluconate and 70% isopropyl alcohol) Safety Precautions: Aspiration looking for blood return was conducted prior to all injections. At no point did we inject any substances, as a needle was being advanced. Before injecting, the patient was told to immediately notify me if she was experiencing any new onset of "ringing in the ears, or metallic taste in the mouth". No attempts were made at seeking any paresthesias. Safe injection practices and needle disposal techniques used. Medications properly checked for expiration dates. SDV (single dose vial) medications used. After the completion of the  procedure, all disposable equipment used was discarded in the proper designated medical waste containers. Local Anesthesia: Protocol guidelines were followed. The patient was positioned over the fluoroscopy table. The area was prepped in the usual manner. The time-out was completed. The target area was identified using fluoroscopy. A 12-in long, straight, sterile hemostat was used with fluoroscopic guidance to locate the targets for each level blocked. Once located, the skin was marked with an approved surgical skin marker. Once all sites were marked, the skin (epidermis,  dermis, and hypodermis), as well as deeper tissues (fat, connective tissue and muscle) were infiltrated with a small amount of a short-acting local anesthetic, loaded on a 10cc syringe with a 25G, 1.5-in  Needle. An appropriate amount of time was allowed for local anesthetics to take effect before proceeding to the next step. Local Anesthetic: Lidocaine 2.0% The unused portion of the local anesthetic was discarded in the proper designated containers. Technical explanation of process:   C4 Medial Branch Nerve Block (MBB): The target area for the C4 dorsal medial articular branch is the lateral concave waist of the articular pillar of C4. Under fluoroscopic guidance, a Quincke needle was inserted until contact was made with os over the postero-lateral aspect of the articular pillar of C4 (target area). After negative aspiration for blood, 76mL of the nerve block solution was injected without difficulty or complication. The needle was removed intact. C5 Medial Branch Nerve Block (MBB): The target area for the C5 dorsal medial articular branch is the lateral concave waist of the articular pillar of C5. Under fluoroscopic guidance, a Quincke needle was inserted until contact was made with os over the postero-lateral aspect of the articular pillar of C5 (target area). After negative aspiration for blood, 1 mL of the nerve block solution was injected  without difficulty or complication. The needle was removed intact. C6 Medial Branch Nerve Block (MBB): The target area for the C6 dorsal medial articular branch is the lateral concave waist of the articular pillar of C6. Under fluoroscopic guidance, a Quincke needle was inserted until contact was made with os over the postero-lateral aspect of the articular pillar of C6 (target area). After negative aspiration for blood, 69mL of the nerve block solution was injected without difficulty or complication. The needle was removed intact. C7 Medial Branch Nerve Block (MBB): The target for the C7 dorsal medial articular branch lies on the superior-medial tip of the C7 transverse process. Under fluoroscopic guidance, a Quincke needle was inserted until contact was made with os over the postero-lateral aspect of the articular pillar of C7 (target area). After negative aspiration for blood, 25mL of the nerve block solution was injected without difficulty or complication. The needle was removed intact.  Procedural Needles: 22-gauge, 3.5-inch, Quincke needles used for all levels. Nerve block solution: 5 cc solution made of 4 cc of 0.2% ropivacaine, 1 cc of Decadron 10 mg/cc.  1 cc injected at each level above.  The unused portion of the solution was discarded in the proper designated containers.  Once the entire procedure was completed, the treated area was cleaned, making sure to leave some of the prepping solution back to take advantage of its long term bactericidal properties.  Vitals:   07/18/18 1140 07/18/18 1144 07/18/18 1154 07/18/18 1204  BP:  (!) 133/95 133/85 (!) 151/81  Pulse:      Resp:  16 18 16   Temp: 97.9 F (36.6 C)  97.6 F (36.4 C)   TempSrc:      SpO2:  98% 98% 100%  Weight:      Height:        Start Time: 1115 hrs. End Time: 1132 hrs.  Imaging Guidance (Spinal):  Type of Imaging Technique: Fluoroscopy Guidance (Spinal) Indication(s): Assistance in needle guidance and placement for  procedures requiring needle placement in or near specific anatomical locations not easily accessible without such assistance. Exposure Time: Please see nurses notes. Contrast: None used. Fluoroscopic Guidance: I was personally present during the use of fluoroscopy. "Tunnel Vision Technique"  used to obtain the best possible view of the target area. Parallax error corrected before commencing the procedure. "Direction-depth-direction" technique used to introduce the needle under continuous pulsed fluoroscopy. Once target was reached, antero-posterior, oblique, and lateral fluoroscopic projection used confirm needle placement in all planes. Images permanently stored in EMR. Interpretation: No contrast injected. I personally interpreted the imaging intraoperatively. Adequate needle placement confirmed in multiple planes. Permanent images saved into the patient's record.  Antibiotic Prophylaxis:   Anti-infectives (From admission, onward)   None     Indication(s): None identified  Post-operative Assessment:  Post-procedure Vital Signs:  Pulse/HCG Rate: 8077 Temp: 97.6 F (36.4 C) Resp: 16 BP: (!) 151/81 SpO2: 100 %  EBL: None  Complications: No immediate post-treatment complications observed by team, or reported by patient.  Note: The patient tolerated the entire procedure well. A repeat set of vitals were taken after the procedure and the patient was kept under observation following institutional policy, for this type of procedure. Post-procedural neurological assessment was performed, showing return to baseline, prior to discharge. The patient was provided with post-procedure discharge instructions, including a section on how to identify potential problems. Should any problems arise concerning this procedure, the patient was given instructions to immediately contact us, at any time, without hesitation. In any case, we plan to contact the patient by telephone for a follow-up status report  regarding this interventional procedure.  Comments:  No additional relevant information. 5 out of 5 strength bilateral upper extremity: Shoulder abduction, elbow flexion, elbow extension, thumb extension.  Plan of Care    Imaging Orders     DG C-Arm 1-60 Min-No Report Procedure Orders    No procedure(s) ordered today    Medications ordered for procedure: Meds ordered this encounter  Medications  . lactated ringers infusion 1,000 mL  . fentaNYL (SUBLIMAZE) injection 25-100 mcg    Make sure Narcan is available in the pyxis when using this medication. In the event of respiratory depression (RR< 8/min): Titrate NARCAN (naloxone) in increments of 0.1 to 0.2 mg IV at 2-3 minute intervals, until desired degree of reversal.  . ropivacaine (PF) 2 mg/mL (0.2%) (NAROPIN) injection 10 mL  . lidocaine (XYLOCAINE) 2 % (with pres) injection 400 mg  . dexamethasone (DECADRON) injection 10 mg   Medications administered: We administered lactated ringers, fentaNYL, ropivacaine (PF) 2 mg/mL (0.2%), lidocaine, and dexamethasone.  See the medical record for exact dosing, route, and time of administration.  New Prescriptions   No medications on file   Disposition: Discharge home  Discharge Date & Time: 07/18/2018; 1206 hrs.   Physician-requested Follow-up: Return in about 8 weeks (around 09/12/2018) for Post Procedure Evaluation.  Future Appointments  Date Time Provider [place]  07/28/2018  2:40 PM Minna Merritts, MD [id number]  09/13/2018  9:30 AM [name], MD [facility] None   Primary Care Physician: Rubye Beach Location: [facility] Note by: [name], MD Date: 07/18/2018; Time: 12:39 PM  Disclaimer:  Medicine is not an exact science. The only guarantee in medicine is that nothing is guaranteed. It is important to note that the decision to proceed with this intervention was based on the information collected from the  patient. The Data and conclusions were drawn from the patient's questionnaire, the interview, and the physical examination. Because the information was provided in large part by the patient, it cannot be guaranteed that it has not been purposely or unconsciously manipulated. Every effort has been made to obtain as  much relevant data as possible for this evaluation. It is important to note that the conclusions that lead to this procedure are derived in large part from the available data. Always take into account that the treatment will also be dependent on availability of resources and existing treatment guidelines, considered by other Pain Management Practitioners as being common knowledge and practice, at the time of the intervention. For Medico-Legal purposes, it is also important to point out that variation in procedural techniques and pharmacological choices are the acceptable norm. The indications, contraindications, technique, and results of the above procedure should only be interpreted and judged by a Board-Certified Interventional Pain Specialist with extensive familiarity and expertise in the same exact procedure and technique.

## 2018-07-19 NOTE — Telephone Encounter (Signed)
Post procedure phone call.  LM 

## 2018-07-27 NOTE — Progress Notes (Signed)
Cardiology Office Note  Date:  07/28/2018   ID:  Gabrielle Pineda, DOB 10-31-1955, MRN [id number]  PCP:  [name], PA-C   Chief Complaint  Patient presents with  . other    12 month f/u no complaints today. Meds reviewed verbally with pt.    HPI:  Gabrielle Pineda is a very pleasant 62 yo old woman with a  long history of smoking since age 55,  hyperlipidemia,  history of tachycardia/possible SVT  at times requiring adenosine? (details not available), and chest pain  chronic neck and shoulder pain. Prior surgeries.  rheumatoid arthritis. who presents for routine follow up of her tachycardia.   In follow-up today she reports having numerous traumatic accidents Fall July 2019, broke ribs, wrist Wrist healed "bent"  Previously Tailbone broke, severe pain Neck cervical fusion, "did not take" Going to pain clinic, Has shots  Trying to quit smoking, has not been able to stop Unable to afford Chantix Nicotine patches do not work Blood pressure has been running high at home, thinks medications need to be adjusted  Denies tachycardia, palpitations, no shortness of breath or chest pain  Lab work reviewed with her Hemoglobin A1c of 5 Total chol 258, reports compliance with her Lipitor Numbers are well up from her previous measurements 160  EKG personally reviewed by myself on todays visit Shows normal sinus rhythm with rate 88 bpm no significant ST or T wave changes  Other past medical history  On previous office visits, reported having chronic neck and shoulder pain.   Lab work from January 2014 shows total cholesterol 196, LDL 114, HDL 59 Lab work from February 2015 shows total cholesterol 212, LDL 136, HDL 66  PMH:   has a past medical history of Alcohol abuse, Anemia, Arthritis, Cervicalgia, COPD (chronic obstructive pulmonary disease) ([place]), Depression, GERD (gastroesophageal reflux disease), Heart murmur, Other and unspecified hyperlipidemia, and  Tachycardia.  PSH:    Past Surgical History:  Procedure Laterality Date  . AUGMENTATION MAMMAPLASTY Bilateral 1982  . BREAST ENHANCEMENT SURGERY  1981  . CARPAL TUNNEL RELEASE  11/11/2011   Procedure: CARPAL TUNNEL RELEASE;  Surgeon: Floyce Stakes, MD;  Location: [facility];  Service: Neurosurgery;  Laterality: Right;  Right Median Nerve Decompression  . CARPAL TUNNEL RELEASE  2013  . COLONOSCOPY WITH PROPOFOL N/A 11/01/2017   Procedure: COLONOSCOPY WITH PROPOFOL;  Surgeon: Manya Silvas, MD;  Location: [facility] ENDOSCOPY;  Service: Endoscopy;  Laterality: N/A;  . ESOPHAGOGASTRODUODENOSCOPY    . neck disc surgery     plates and screws in neck x 2  . ROTATOR CUFF REPAIR  06/16/2016   right shoulder   . ROTATOR CUFF REPAIR    . TONSILLECTOMY AND ADENOIDECTOMY  1973     Current Outpatient Medications  Medication Sig Dispense Refill  . atorvastatin (LIPITOR) 40 MG tablet TAKE 1 TABLET BY MOUTH ONCE DAILY 90 tablet 3  . b complex vitamins tablet Take 1 tablet by mouth daily. B12    . busPIRone (BUSPAR) 30 MG tablet Take 1 tablet (30 mg total) by mouth 3 (three) times daily. 90 tablet 1  . diclofenac sodium (VOLTAREN) 1 % GEL Apply topically 4 (four) times daily.    Marland Kitchen diltiazem (DILTIAZEM CD) 120 MG 24 hr capsule Take 1 capsule (120 mg total) by mouth daily. 90 capsule 0  . fluocinonide ointment (LIDEX) 8.85 % Apply 1 application topically 2 (two) times daily.    . furosemide (LASIX) 20 MG tablet Take 1  tablet (20 mg total) by mouth daily as needed for fluid. 30 tablet 3  . gabapentin (NEURONTIN) 600 MG tablet 600 mg 3 times daily.  Okay to increase nighttime dose to 1200 mg. 120 tablet 3  . guaiFENesin (MUCINEX) 600 MG 12 hr tablet Take 1,200 mg by mouth 2 (two) times daily.    Marland Kitchen ibuprofen (ADVIL,MOTRIN) 200 MG tablet Take 200 mg by mouth every 6 (six) hours as needed. For pain    . leflunomide (ARAVA) 10 MG tablet Take 10 mg by mouth daily.    . Multiple Vitamin (MULTIVITAMIN) tablet  Take 1 tablet by mouth daily.      . Nutritional Supplements (ESTROVEN ENERGY PO) Take by mouth daily.    Marland Kitchen omeprazole (PRILOSEC) 20 MG capsule Take 20 mg by mouth daily.    . potassium chloride SA (K-DUR,KLOR-CON) 20 MEQ tablet Take 1 tablet (20 mEq total) by mouth daily. Take with furosemide 30 tablet 3  . sertraline (ZOLOFT) 100 MG tablet Take 1 tablet (100 mg total) by mouth daily. 90 tablet 1  . topiramate (TOPAMAX) 25 MG tablet Take 1 tablet (25 mg total) by mouth 2 (two) times daily. 60 tablet 5  . traZODone (DESYREL) 50 MG tablet Take 1-2 tablets (50-100 mg total) by mouth at bedtime. 180 tablet 1  . valACYclovir (VALTREX) 1000 MG tablet TAKE TWO TABLETS BY MOUTH TWICE DAILY FOR 5 DAYS AS NEEDED 180 tablet 3  . venlafaxine XR (EFFEXOR-XR) 75 MG 24 hr capsule Take 3 capsules (225 mg total) by mouth daily with breakfast. 270 capsule 1  . XIIDRA 5 % SOLN Place 1 drop into both eyes daily.    Marland Kitchen oxyCODONE (OXY IR/ROXICODONE) 5 MG immediate release tablet TAKE 1 TABLET BY MOUTH EVERY 4 HOURS AS NEEDED FOR PAIN FORUP TO 5 DAYS  0   Current Facility-Administered Medications  Medication Dose Route Frequency Provider Last Rate Last Dose  . ipratropium-albuterol (DUONEB) 0.5-2.5 (3) MG/3ML nebulizer solution 3 mL  3 mL Nebulization Once Pollak, Adriana M, PA-C         Allergies:   Plaquenil [hydroxychloroquine]   Social History:  The patient  reports that she has been smoking. She has a 40.00 pack-year smoking history. She has never used smokeless tobacco. She reports that she does not drink alcohol or use drugs.   Family History:   family history includes Alcohol abuse in her father and mother; Bipolar disorder in her mother; Pancreatic cancer in her father; Suicidality in her mother.    Review of Systems: Review of Systems  Constitutional: Negative.   HENT: Negative.   Respiratory: Negative.   Cardiovascular: Negative.   Gastrointestinal: Negative.   Musculoskeletal: Positive for back  pain, joint pain and neck pain.       Left wrist pain  Neurological: Negative.   Psychiatric/Behavioral: Negative.   All other systems reviewed and are negative.    PHYSICAL EXAM: VS:  BP (!) 142/90 (BP Location: Left Arm, Patient Position: Sitting, Cuff Size: Normal)   Pulse 88   Ht 5\' 9"  (1.753 m)   Wt 137 lb 4 oz (62.3 kg)   BMI 20.27 kg/m  , BMI Body mass index is 20.27 kg/m. Constitutional:  oriented to person, place, and time. No distress.  HENT:  Head: Grossly normal Eyes:  no discharge. No scleral icterus.  Neck: No JVD, no carotid bruits  Cardiovascular: Regular rate and rhythm, no murmurs appreciated Pulmonary/Chest: Clear to auscultation bilaterally, no wheezes or rails Abdominal: Soft.  no distension.  no tenderness.  Musculoskeletal: Normal range of motion Neurological:  normal muscle tone. Coordination normal. No atrophy Skin: Skin warm and dry Psychiatric: normal affect, pleasant   Recent Labs: 04/14/2018: ALT 14; BUN 5; Creatinine, Ser 0.71; Hemoglobin 12.2; Platelets 268; Potassium 3.8; Sodium 140; TSH 1.700    Lipid Panel Lab Results  Component Value Date   CHOL 238 (H) 04/14/2018   HDL 63 04/14/2018   LDLCALC 130 (H) 04/14/2018   TRIG 223 (H) 04/14/2018      Wt Readings from Last 3 Encounters:  07/28/18 137 lb 4 oz (62.3 kg)  07/18/18 145 lb (65.8 kg)  06/27/18 123 lb 12.8 oz (56.2 kg)      ASSESSMENT AND PLAN:   Hypertension Increase diltiazem up to 180 mg daily Reports blood pressure has been running high at home  Paroxysmal supraventricular tachycardia (HCC) - Diltiazem 180 mg daily  Hypercholesteremia Encourage compliance with her Lipitor She reports that she is taking Lipitor daily and does not miss any doses Numbers are markedly higher on recent check compared to previous measures of cholesterol We will add Zetia 10 mg daily  SMOKER Unable to afford Chantix She will try other modalities   Total encounter time more than 25  minutes  Greater than 50% was spent in counseling and coordination of care with the patient   Disposition:   F/U  12 months    Orders Placed This Encounter  Procedures  . EKG 12-Lead     Signed, Esmond Plants, M.D., Ph.D. 07/28/2018  [place], [place]

## 2018-07-28 ENCOUNTER — Ambulatory Visit (INDEPENDENT_AMBULATORY_CARE_PROVIDER_SITE_OTHER): Payer: BLUE CROSS/BLUE SHIELD | Admitting: Cardiovascular Disease

## 2018-07-28 ENCOUNTER — Encounter: Payer: Self-pay | Admitting: Cardiovascular Disease

## 2018-07-28 VITALS — BP 142/90 | HR 88 | Ht 69.0 in | Wt 137.2 lb

## 2018-07-28 DIAGNOSIS — E782 Mixed hyperlipidemia: Secondary | ICD-10-CM | POA: Diagnosis not present

## 2018-07-28 DIAGNOSIS — E7800 Pure hypercholesterolemia, unspecified: Secondary | ICD-10-CM

## 2018-07-28 DIAGNOSIS — F17200 Nicotine dependence, unspecified, uncomplicated: Secondary | ICD-10-CM

## 2018-07-28 DIAGNOSIS — I471 Supraventricular tachycardia: Secondary | ICD-10-CM | POA: Diagnosis not present

## 2018-07-28 DIAGNOSIS — R0602 Shortness of breath: Secondary | ICD-10-CM

## 2018-07-28 DIAGNOSIS — I10 Essential (primary) hypertension: Secondary | ICD-10-CM

## 2018-07-28 MED ORDER — EZETIMIBE 10 MG PO TABS: 10.0000 mg | ORAL_TABLET | Freq: Every day | ORAL | 3 refills | Status: DC

## 2018-07-28 MED ORDER — DILTIAZEM HCL ER COATED BEADS 180 MG PO CP24: 180.0000 mg | ORAL_CAPSULE | Freq: Every day | ORAL | 3 refills | Status: DC

## 2018-07-28 NOTE — Patient Instructions (Addendum)
Medication Instructions:   Please increase the diltiazem ER up to 180 mg daily  Please start zetia 10 mg daily for cholesterol  If you need a refill on your cardiac medications before your next appointment, please call your pharmacy.    Lab work: No new labs needed   If you have labs (blood work) drawn today and your tests are completely normal, you will receive your results only by: Marland Kitchen MyChart Message (if you have MyChart) OR . A paper copy in the mail If you have any lab test that is abnormal or we need to change your treatment, we will call you to review the results.   Testing/Procedures: No new testing needed   Follow-Up: At [facility], you and your health needs are our priority.  As part of our continuing mission to provide you with exceptional heart care, we have created designated Provider Care Teams.  These Care Teams include your primary Cardiologist (physician) and Advanced Practice Providers (APPs -  Physician Assistants and Nurse Practitioners) who all work together to provide you with the care you need, when you need it.  . You will need a follow up appointment in 12 months .   Please call our office 2 months in advance to schedule this appointment.    . Providers on your designated Care Team:   . Murray Hodgkins, NP . Christell Faith, PA-C . Marrianne Mood, PA-C  Any Other Special Instructions Will Be Listed Below (If Applicable).  For educational health videos Log in to : [email or web address] Or : [email or web address], password : [email or web address]

## 2018-08-04 ENCOUNTER — Encounter: Payer: Self-pay | Admitting: Student in an Organized Health Care Education/Training Program

## 2018-08-12 ENCOUNTER — Encounter: Payer: Self-pay | Admitting: Physician Assistant

## 2018-08-12 DIAGNOSIS — J209 Acute bronchitis, unspecified: Secondary | ICD-10-CM

## 2018-08-12 DIAGNOSIS — J440 Chronic obstructive pulmonary disease with acute lower respiratory infection: Principal | ICD-10-CM

## 2018-08-12 MED ORDER — AZITHROMYCIN 250 MG PO TABS: ORAL_TABLET | ORAL | 0 refills | Status: DC

## 2018-08-19 NOTE — Progress Notes (Signed)
Patient: Gabrielle Pineda Female    DOB: [birth year]   63 y.o.   MRN: [id number] Visit Date: 08/22/2018  Today's Provider: [name], PA-C   Chief Complaint  Patient presents with  . Cough   Subjective:     Cough  This is a new problem. The current episode started 1 to 4 weeks ago (3 weeks). The cough is productive of sputum. Associated symptoms include ear pain, headaches, myalgias, nasal congestion, rhinorrhea, a sore throat, shortness of breath and sweats. Pertinent negatives include no chest pain, chills, ear congestion, fever, heartburn, hemoptysis, postnasal drip or rash. Treatments tried: nebulizer, Alka selzer tabs, The treatment provided mild relief.   Patient has had a productive cough for 3 1/2 weeks. Patient states she went to a walk in clinic 1 week ago and was given a nebulizer, prednisone. Patient states she is not any better. Patient still has symptoms of shortness of breath, wheezing, ear pain, nasal congestion, and sore throat.   Allergies  Allergen Reactions  . Plaquenil [Hydroxychloroquine] Rash     Current Outpatient Medications:  .  atorvastatin (LIPITOR) 40 MG tablet, TAKE 1 TABLET BY MOUTH ONCE DAILY, Disp: 90 tablet, Rfl: 3 .  azithromycin (ZITHROMAX) 250 MG tablet, Take 2 tablets PO on day one, and one tablet PO daily thereafter until completed., Disp: 6 tablet, Rfl: 0 .  b complex vitamins tablet, Take 1 tablet by mouth daily. B12, Disp: , Rfl:  .  busPIRone (BUSPAR) 30 MG tablet, Take 1 tablet (30 mg total) by mouth 3 (three) times daily., Disp: 90 tablet, Rfl: 1 .  diclofenac sodium (VOLTAREN) 1 % GEL, Apply topically 4 (four) times daily., Disp: , Rfl:  .  diltiazem (CARDIZEM CD) 180 MG 24 hr capsule, Take 1 capsule (180 mg total) by mouth daily., Disp: 90 capsule, Rfl: 3 .  ezetimibe (ZETIA) 10 MG tablet, Take 1 tablet (10 mg total) by mouth daily., Disp: 90 tablet, Rfl: 3 .  fluocinonide ointment (LIDEX) 0.32 %, Apply 1 application  topically 2 (two) times daily., Disp: , Rfl:  .  furosemide (LASIX) 20 MG tablet, Take 1 tablet (20 mg total) by mouth daily as needed for fluid., Disp: 30 tablet, Rfl: 3 .  gabapentin (NEURONTIN) 600 MG tablet, 600 mg 3 times daily.  Okay to increase nighttime dose to 1200 mg., Disp: 120 tablet, Rfl: 3 .  guaiFENesin (MUCINEX) 600 MG 12 hr tablet, Take 1,200 mg by mouth 2 (two) times daily., Disp: , Rfl:  .  ibuprofen (ADVIL,MOTRIN) 200 MG tablet, Take 200 mg by mouth every 6 (six) hours as needed. For pain, Disp: , Rfl:  .  leflunomide (ARAVA) 10 MG tablet, Take 10 mg by mouth daily., Disp: , Rfl:  .  Multiple Vitamin (MULTIVITAMIN) tablet, Take 1 tablet by mouth daily.  , Disp: , Rfl:  .  Nutritional Supplements (ESTROVEN ENERGY PO), Take by mouth daily., Disp: , Rfl:  .  omeprazole (PRILOSEC) 20 MG capsule, Take 20 mg by mouth daily., Disp: , Rfl:  .  potassium chloride SA (K-DUR,KLOR-CON) 20 MEQ tablet, Take 1 tablet (20 mEq total) by mouth daily. Take with furosemide, Disp: 30 tablet, Rfl: 3 .  sertraline (ZOLOFT) 100 MG tablet, Take 1 tablet (100 mg total) by mouth daily., Disp: 90 tablet, Rfl: 1 .  topiramate (TOPAMAX) 25 MG tablet, Take 1 tablet (25 mg total) by mouth 2 (two) times daily., Disp: 60 tablet, Rfl: 5 .  traZODone (  DESYREL) 50 MG tablet, Take 1-2 tablets (50-100 mg total) by mouth at bedtime., Disp: 180 tablet, Rfl: 1 .  valACYclovir (VALTREX) 1000 MG tablet, TAKE TWO TABLETS BY MOUTH TWICE DAILY FOR 5 DAYS AS NEEDED, Disp: 180 tablet, Rfl: 3 .  venlafaxine XR (EFFEXOR-XR) 75 MG 24 hr capsule, Take 3 capsules (225 mg total) by mouth daily with breakfast., Disp: 270 capsule, Rfl: 1 .  XIIDRA 5 % SOLN, Place 1 drop into both eyes daily., Disp: , Rfl:  .  oxyCODONE (OXY IR/ROXICODONE) 5 MG immediate release tablet, TAKE 1 TABLET BY MOUTH EVERY 4 HOURS AS NEEDED FOR PAIN FORUP TO 5 DAYS, Disp: , Rfl: 0  Current Facility-Administered Medications:  .  ipratropium-albuterol (DUONEB)  0.5-2.5 (3) MG/3ML nebulizer solution 3 mL, 3 mL, Nebulization, Once, Pollak, Adriana M, PA-C  Review of Systems  Constitutional: Negative for appetite change, chills, fatigue and fever.  HENT: Positive for congestion, ear pain, rhinorrhea, sore throat and voice change. Negative for postnasal drip.   Respiratory: Positive for cough, chest tightness and shortness of breath. Negative for hemoptysis.   Cardiovascular: Negative for chest pain and palpitations.  Gastrointestinal: Negative for abdominal pain, heartburn, nausea and vomiting.  Musculoskeletal: Positive for myalgias.  Skin: Negative for rash.  Neurological: Positive for headaches. Negative for dizziness and weakness.    Social History   Tobacco Use  . Smoking status: Current Every Day Smoker    Packs/day: 1.00    Years: 40.00    Pack years: 40.00  . Smokeless tobacco: Never Used  Substance Use Topics  . Alcohol use: No    Comment: recovering alcoholic Since 1610       Objective:   BP 134/88 (BP Location: Left Arm, Patient Position: Sitting, Cuff Size: Normal)   Pulse (!) 102   Temp 98.2 F (36.8 C) (Oral)   Resp 16   Wt 136 lb (61.7 kg)   SpO2 93%   BMI 20.08 kg/m  Vitals:   08/22/18 0800  BP: 134/88  Pulse: (!) 102  Resp: 16  Temp: 98.2 F (36.8 C)  TempSrc: Oral  SpO2: 93%  Weight: 136 lb (61.7 kg)     Physical Exam Vitals signs reviewed.  Constitutional:      General: She is not in acute distress.    Appearance: She is well-developed. She is not diaphoretic.  HENT:     Head: Normocephalic and atraumatic.     Right Ear: Hearing, tympanic membrane, ear canal and external ear normal.     Left Ear: Hearing, tympanic membrane, ear canal and external ear normal.     Nose:     Right Sinus: Maxillary sinus tenderness and frontal sinus tenderness present.     Left Sinus: Maxillary sinus tenderness and frontal sinus tenderness present.     Mouth/Throat:     Pharynx: Uvula midline. No oropharyngeal  exudate.  Neck:     Musculoskeletal: Normal range of motion and neck supple.     Thyroid: No thyromegaly.     Trachea: No tracheal deviation.  Cardiovascular:     Rate and Rhythm: Normal rate and regular rhythm.     Heart sounds: Normal heart sounds. No murmur. No friction rub. No gallop.   Pulmonary:     Effort: Pulmonary effort is normal. No respiratory distress.     Breath sounds: No stridor or decreased air movement. Examination of the left-upper field reveals wheezing. Examination of the right-middle field reveals wheezing. Examination of the right-lower field reveals decreased  breath sounds and wheezing. Examination of the left-lower field reveals wheezing. Decreased breath sounds and wheezing present. No rales.  Lymphadenopathy:     Cervical: No cervical adenopathy.    CLINICAL DATA:  Pt states she has productive cough, congestion, fever, headache, body aches, SOB for 3.5 weeks. History of COPD, HTN, smoker. shielded  EXAM: CHEST - 2 VIEW  COMPARISON:  12/31/2014  FINDINGS: Lungs are hyperinflated. Minimal linear scarring or subsegmental atelectasis in the lingula. Lungs otherwise clear.  Heart size normal.  No effusion.  Cervical fixation hardware partially seen.  IMPRESSION: Stable hyperinflation. Minimal lingular scarring or subsegmental atelectasis.   Electronically Signed   By: Lucrezia Europe M.D.   On: 08/22/2018 09:36    Assessment & Plan     1. Bronchitis Worsening. Will treat with doxycycline, prednisone and tessalon perles. Push fluids. Rest. Call if worsening.  - doxycycline (VIBRA-TABS) 100 MG tablet; Take 1 tablet (100 mg total) by mouth 2 (two) times daily.  Dispense: 20 tablet; Refill: 0 - predniSONE (DELTASONE) 10 MG tablet; Take 6 tabs PO on day 1&2, 5 tabs PO on day 3&4, 4 tabs PO on day 5&6, 3 tabs PO on day 7&8, 2 tabs PO on day 9&10, 1 tab PO on day 11&12.  Dispense: 42 tablet; Refill: 0 - benzonatate (TESSALON) 200 MG capsule; Take 1  capsule (200 mg total) by mouth 2 (two) times daily as needed for cough.  Dispense: 20 capsule; Refill: 0  2. Acute non-recurrent pansinusitis See above medical treatment plan.  3. SOB (shortness of breath) CXR negative for pneumonia. Does show some atelectasis and hyperinflation most likely consistent with emphysema from smoking. - DG Chest 2 View; Future  4. Adventitious breath sounds See above medical treatment plan. - DG Chest 2 View; Future  5. Cough See above medical treatment plan. - DG Chest 2 View; Future - benzonatate (TESSALON) 200 MG capsule; Take 1 capsule (200 mg total) by mouth 2 (two) times daily as needed for cough.  Dispense: 20 capsule; Refill: 0     [name], PA-C  [facility]

## 2018-08-22 ENCOUNTER — Encounter: Payer: Self-pay | Admitting: Physician Assistant

## 2018-08-22 ENCOUNTER — Ambulatory Visit
Admission: RE | Admit: 2018-08-22 | Discharge: 2018-08-22 | Disposition: A | Discharge status: Home / Self Care | Payer: BLUE CROSS/BLUE SHIELD | Attending: Physician Assistant | Admitting: Physician Assistant

## 2018-08-22 ENCOUNTER — Ambulatory Visit (INDEPENDENT_AMBULATORY_CARE_PROVIDER_SITE_OTHER): Payer: BLUE CROSS/BLUE SHIELD | Admitting: Physician Assistant

## 2018-08-22 ENCOUNTER — Ambulatory Visit
Admission: RE | Admit: 2018-08-22 | Discharge: 2018-08-22 | Disposition: A | Discharge status: Home / Self Care | Payer: BLUE CROSS/BLUE SHIELD | Source: Ambulatory Visit | Attending: Physician Assistant | Admitting: Physician Assistant

## 2018-08-22 ENCOUNTER — Telehealth: Payer: Self-pay

## 2018-08-22 VITALS — BP 134/88 | HR 102 | Temp 98.2°F | Resp 16 | Wt 136.0 lb

## 2018-08-22 DIAGNOSIS — R0602 Shortness of breath: Secondary | ICD-10-CM | POA: Insufficient documentation

## 2018-08-22 DIAGNOSIS — J0140 Acute pansinusitis, unspecified: Secondary | ICD-10-CM

## 2018-08-22 DIAGNOSIS — R0689 Other abnormalities of breathing: Secondary | ICD-10-CM

## 2018-08-22 DIAGNOSIS — R05 Cough: Secondary | ICD-10-CM | POA: Diagnosis present

## 2018-08-22 DIAGNOSIS — R059 Cough, unspecified: Secondary | ICD-10-CM

## 2018-08-22 DIAGNOSIS — J40 Bronchitis, not specified as acute or chronic: Secondary | ICD-10-CM | POA: Diagnosis not present

## 2018-08-22 MED ORDER — BENZONATATE 200 MG PO CAPS: 200.0000 mg | ORAL_CAPSULE | Freq: Two times a day (BID) | ORAL | 0 refills | Status: DC | PRN

## 2018-08-22 MED ORDER — PREDNISONE 10 MG PO TABS: ORAL_TABLET | ORAL | 0 refills | Status: DC

## 2018-08-22 MED ORDER — DOXYCYCLINE HYCLATE 100 MG PO TABS: 100.0000 mg | ORAL_TABLET | Freq: Two times a day (BID) | ORAL | 0 refills | Status: DC

## 2018-08-22 NOTE — Telephone Encounter (Signed)
-----   Message from [name], PA-C sent at 08/22/2018 10:16 AM EST ----- No pneumonia noted. Will send in Doxycycline and steroid for bronchitis and sinus infection

## 2018-08-22 NOTE — Telephone Encounter (Signed)
Patient has been advised. KW 

## 2018-09-13 ENCOUNTER — Encounter: Payer: Self-pay | Admitting: Physician Assistant

## 2018-09-13 ENCOUNTER — Ambulatory Visit
Payer: BLUE CROSS/BLUE SHIELD | Attending: Student in an Organized Health Care Education/Training Program | Admitting: Student in an Organized Health Care Education/Training Program

## 2018-09-13 ENCOUNTER — Encounter: Payer: Self-pay | Admitting: Student in an Organized Health Care Education/Training Program

## 2018-09-13 ENCOUNTER — Other Ambulatory Visit: Payer: Self-pay

## 2018-09-13 VITALS — BP 152/83 | HR 84 | Temp 98.5°F | Ht 69.0 in | Wt 143.0 lb

## 2018-09-13 DIAGNOSIS — G894 Chronic pain syndrome: Secondary | ICD-10-CM | POA: Diagnosis present

## 2018-09-13 DIAGNOSIS — M542 Cervicalgia: Secondary | ICD-10-CM | POA: Diagnosis present

## 2018-09-13 DIAGNOSIS — M5030 Other cervical disc degeneration, unspecified cervical region: Secondary | ICD-10-CM | POA: Diagnosis present

## 2018-09-13 DIAGNOSIS — G629 Polyneuropathy, unspecified: Secondary | ICD-10-CM | POA: Diagnosis present

## 2018-09-13 DIAGNOSIS — M47812 Spondylosis without myelopathy or radiculopathy, cervical region: Secondary | ICD-10-CM | POA: Insufficient documentation

## 2018-09-13 MED ORDER — GABAPENTIN 600 MG PO TABS: ORAL_TABLET | ORAL | 5 refills | Status: DC

## 2018-09-13 MED ORDER — TOPIRAMATE 25 MG PO TABS: 25.0000 mg | ORAL_TABLET | Freq: Two times a day (BID) | ORAL | 5 refills | Status: DC

## 2018-09-13 NOTE — Patient Instructions (Signed)
Preparing for Procedure with Sedation Instructions: . Oral Intake: Do not eat or drink anything for at least 8 hours prior to your procedure. . Transportation: Public transportation is not allowed. Bring an adult driver. The driver must be physically present in our waiting room before any procedure can be started. . Physical Assistance: Bring an adult capable of physically assisting you, in the event you need help. . Blood Pressure Medicine: Take your blood pressure medicine with a sip of water the morning of the procedure. . Insulin: Take only  of your normal insulin dose. . Preventing infections: Shower with an antibacterial soap the morning of your procedure. . Build-up your immune system: Take 1000 mg of Vitamin C with every meal (3 times a day) the day prior to your procedure. . Pregnancy: If you are pregnant, call and cancel the procedure. . Sickness: If you have a cold, fever, or any active infections, call and cancel the procedure. . Arrival: You must be in the facility at least 30 minutes prior to your scheduled procedure. . Children: Do not bring children with you. . Dress appropriately: Bring dark clothing that you would not mind if they get stained. . Valuables: Do not bring any jewelry or valuables. Procedure appointments are reserved for interventional treatments only. . No Prescription Refills. . No medication changes will be discussed during procedure appointments. No disability issues will be discussed.Post-procedure Information What to expect: Most procedures involve the use of a local anesthetic (numbing medicine), and a steroid (anti-inflammatory medicine).  The local anesthetics may cause temporary numbness and weakness of the legs or arms, depending on the location of the block. This numbness/weakness may last 4-6 hours, depending on the local anesthetic used. In rare instances, it can last up to 24 hours. While numb, you must be very careful not to injure the  extremity.  After any procedure, you could expect the pain to get better within 15-20 minutes. This relief is temporary and may last 4-6 hours. Once the local anesthetics wears off, you could experience discomfort, possibly more than usual, for up to 10 (ten) days. In the case of radiofrequencies, it may last up to 6 weeks. Surgeries may take up to 8 weeks for the healing process. The discomfort is due to the irritation caused by needles going through skin and muscle. To minimize the discomfort, we recommend using ice the first day, and heat from then on. The ice should be applied for 15 minutes on, and 15 minutes off. Keep repeating this cycle until bedtime. Avoid applying the ice directly to the skin, to prevent frostbite. Heat should be used daily, until the pain improves (4-10 days). Be careful not to burn yourself.  Occasionally you may experience muscle spasms or cramps. These occur as a consequence of the irritation caused by the needle sticks to the muscle and the blood that will inevitably be lost into the surrounding muscle tissue. Blood tends to be very irritating to tissues, which tend to react by going into spasm. These spasms may start the same day of your procedure, but they may also take days to develop. This late onset type of spasm or cramp is usually caused by electrolyte imbalances triggered by the steroids, at the level of the kidney. Cramps and spasms tend to respond well to muscle relaxants, multivitamins (some are triggered by the procedure, but may have their origins in vitamin deficiencies), and "Gatorade", or any sports drinks that can replenish any electrolyte imbalances. (If you are a diabetic, ask   your pharmacist to get you a sugar-free brand.) Warm showers or baths may also be helpful. Stretching exercises are highly recommended. General Instructions:  Be alert for signs of possible infection: redness, swelling, heat, red streaks, elevated temperature, and/or fever. These  typically appear 4 to 6 days after the procedure. Immediately notify your doctor if you experience unusual bleeding, difficulty breathing, or loss of bowel or bladder control. If you experience increased pain, do not increase your pain medicine intake, unless instructed by your pain physician. Post-Procedure Care:  Be careful in moving about. Muscle spasms in the area of the injection may occur. Applying ice or heat to the area is often helpful. The incidence of spinal headaches after epidural injections ranges between 1.4% and 6%. If you develop a headache that does not seem to respond to conservative therapy, please let your physician know. This can be treated with an epidural blood patch.   Post-procedure numbness or redness is to be expected, however it should average 4 to 6 hours. If numbness and weakness of your extremities begins to develop 4 to 6 hours after your procedure, and is felt to be progressing and worsening, immediately contact your physician.   Diet:  If you experience nausea, do not eat until this sensation goes away. If you had a "Stellate Ganglion Block" for upper extremity "Reflex Sympathetic Dystrophy", do not eat or drink until your hoarseness goes away. In any case, always start with liquids first and if you tolerate them well, then slowly progress to more solid foods. Activity:  For the first 4 to 6 hours after the procedure, use caution in moving about as you may experience numbness and/or weakness. Use caution in cooking, using household electrical appliances, and climbing steps. If you need to reach your Doctor call our office: [phone]) [phone] Monday-Thursday 8:00 am - 4:00 PM    Fridays: Closed     In case of an emergency: In case of emergency, call 911 or go to the nearest emergency room and have the physician there call us.  Interpretation of Procedure Every nerve block has two components: a diagnostic component, and a treatment component. Unrealistic expectations are  the most common causes of "perceived failure".  In a perfect world, a single nerve block should be able to completely and permanently eliminate the pain. Sadly, the world is not perfect.  Most pain management nerve blocks are performed using local anesthetics and steroids. Steroids are responsible for any long-term benefit that you may experience. Their purpose is to decrease any chronic swelling that may exist in the area. Steroids begin to work immediately after being injected. However, most patients will not experience any benefits until 5 to 10 days after the injection, when the swelling has come down to the point where they can tell a difference. Steroids will only help if there is swelling to be treated. As such, they can assist with the diagnosis. If effective, they suggest an inflammatory component to the pain, and if ineffective, they rule out inflammation as the main cause or component of the problem. If the problem is one of mechanical compression, you will get no benefit from those steroids.   In the case of local anesthetics, they have a crucial role in the diagnosis of your condition. Most will begin to work within15 to 20 minutes after injection. The duration will depend on the type used (short- vs. Long-acting). It is of outmost importance that patients keep tract of their pain, after the procedure. To assist with  this matter, a "Post-procedure Pain Diary" is provided. Make sure to complete it and to bring it back to your follow-up appointment.  As long as the patient keeps accurate, detailed records of their symptoms after every procedure, and returns to have those interpreted, every procedure will provide [place] with invaluable information. Even a block that does not provide the patient with any relief, will always provide [place] with information about the mechanism and the origin of the pain. The only time a nerve block can be considered a waste of time is when patients do not keep track of the  results, or do not keep their post-procedure appointment.  Reporting the results back to your physician The Pain Score  Pain is a subjective complaint. It cannot be seen, touched, or measured. We depend entirely on the patient's report of the pain in order to assess your condition and treatment. To evaluate the pain, we use a pain scale, where "0" means "No Pain", and a "10" is "the worst possible pain that you can even imagine" (i.e. something like been eaten alive by a shark or being torn apart by a lion).   You will frequently be asked to rate your pain. Please be as accurate, remember that medical decisions will be based on your responses. Please do not rate your pain above a 10. Doing so is actually interpreted as "symptom magnification" (exaggeration), as well as lack of understanding with regards to the scale. To put this into perspective, when you tell us that your pain is at a 10 (ten), what you are saying is that there is nothing we can do to make this pain any worse. (Carefully think about that.)Facet Blocks Patient Information  Description: The facets are joints in the spine between the vertebrae.  Like any joints in the body, facets can become irritated and painful.  Arthritis can also effect the facets.  By injecting steroids and local anesthetic in and around these joints, we can temporarily block the nerve supply to them.  Steroids act directly on irritated nerves and tissues to reduce selling and inflammation which often leads to decreased pain.  Facet blocks may be done anywhere along the spine from the neck to the low back depending upon the location of your pain.   After numbing the skin with local anesthetic (like Novocaine), a small needle is passed onto the facet joints under x-ray guidance.  You may experience a sensation of pressure while this is being done.  The entire block usually lasts about 15-25 minutes.   Conditions which may be treated by facet blocks:  Low back/buttock  pain Neck/shoulder pain Certain types of headaches  Preparation for the injection:  Do not eat any solid food or dairy products within 8 hours of your appointment. You may drink clear liquid up to 3 hours before appointment.  Clear liquids include water, black coffee, juice or soda.  No milk or cream please. You may take your regular medication, including pain medications, with a sip of water before your appointment.  Diabetics should hold regular insulin (if taken separately) and take 1/2 normal NPH dose the morning of the procedure.  Carry some sugar containing items with you to your appointment. A driver must accompany you and be prepared to drive you home after your procedure. Bring all your current medications with you. An IV may be inserted and sedation may be given at the discretion of the physician. A blood pressure cuff, EKG and other monitors will often be applied  during the procedure.  Some patients may need to have extra oxygen administered for a short period. You will be asked to provide medical information, including your allergies and medications, prior to the procedure.  We must know immediately if you are taking blood thinners (like Coumadin/Warfarin) or if you are allergic to IV iodine contrast (dye).  We must know if you could possible be pregnant.  Possible side-effects:  Bleeding from needle site Infection (rare, may require surgery) Nerve injury (rare) Numbness & tingling (temporary) Difficulty urinating (rare, temporary) Spinal headache (a headache worse with upright posture) Light-headedness (temporary) Pain at injection site (serveral days) Decreased blood pressure (rare, temporary) Weakness in arm/leg (temporary) Pressure sensation in back/neck (temporary)   Call if you experience:  Fever/chills associated with headache or increased back/neck pain Headache worsened by an upright position New onset, weakness or numbness of an extremity below the injection  site Hives or difficulty breathing (go to the emergency room) Inflammation or drainage at the injection site(s) Severe back/neck pain greater than usual New symptoms which are concerning to you  Please note:  Although the local anesthetic injected can often make your back or neck feel good for several hours after the injection, the pain will likely return. It takes 3-7 days for steroids to work.  You may not notice any pain relief for at least one week.  If effective, we will often do a series of 2-3 injections spaced 3-6 weeks apart to maximally decrease your pain.  After the initial series, you may be a candidate for a more permanent nerve block of the facets.  If you have any questions, please call #[phone] . [facility]

## 2018-09-13 NOTE — Progress Notes (Signed)
Safety precautions to be maintained throughout the outpatient stay will include: orient to surroundings, keep bed in low position, maintain call bell within reach at all times, provide assistance with transfer out of bed and ambulation.  

## 2018-09-13 NOTE — Progress Notes (Signed)
Patient's Name: Gabrielle Pineda  MRN: [id number]  Referring Provider: [name], [place]*  DOB: August 08, 1956  PCP: [name], PA-C  DOS: 09/13/2018  Note by: [name], MD  Service setting: Ambulatory outpatient  Specialty: Interventional Pain Management  Location: [facility] (AMB) Pain Management Facility    Patient type: Established   Primary Reason(s) for Visit: Encounter for post-procedure evaluation of chronic illness with mild to moderate exacerbation CC: Shoulder Pain (right) and Neck Pain  HPI  Gabrielle Pineda is a 62 y.o. year old, female patient, who comes today for a post-procedure evaluation. She has Hyperlipidemia; SMOKER; Paroxysmal supraventricular tachycardia ([place]); CHEST PAIN UNSPECIFIED; ELECTROCARDIOGRAM, ABNORMAL; Shortness of breath; Closed fracture of distal phalanx of thumb; Acid reflux; Arthritis, degenerative; Arthritis or polyarthritis, rheumatoid ([place]); B-complex deficiency; CN (constipation); Clinical depression; Family history of malignant neoplasm of pancreas; Cold sore; H/O alcohol abuse; Hypercholesteremia; Disorder of iron metabolism; Cannot sleep; Localized osteoarthrosis, hand; Menopausal symptom; Weight loss; Delayed onset of urination; COPD (chronic obstructive pulmonary disease) ([place]); Closed compression fracture of L5 lumbar vertebra; Sacral insufficiency fracture with routine healing; Essential hypertension; Cervical radiculopathy; Neuropathy; and History of adenomatous polyp of colon on their problem list. Her primarily concern today is the Shoulder Pain (right) and Neck Pain  Pain Assessment: Location: Right Neck Radiating: radiates into right shoulder Onset: More than a month ago Duration: Chronic pain Quality: Discomfort, Constant Severity: 9 /10 (subjective, self-reported pain score)  Note: Reported level is inconsistent with clinical observations. Clinically the patient looks like a 3/10 A 3/10 is viewed as "Moderate" and described as  significantly interfering with activities of daily living (ADL). It becomes difficult to feed, bathe, get dressed, get on and off the toilet or to perform personal hygiene functions. Difficult to get in and out of bed or a chair without assistance. Very distracting. With effort, it can be ignored when deeply involved in activities. Information on the proper use of the pain scale provided to the patient today. When using our objective Pain Scale, levels between 6 and 10/10 are said to belong in an emergency room, as it progressively worsens from a 6/10, described as severely limiting, requiring emergency care not usually available at an outpatient pain management facility. At a 6/10 level, communication becomes difficult and requires great effort. Assistance to reach the emergency department may be required. Facial flushing and profuse sweating along with potentially dangerous increases in heart rate and blood pressure will be evident. Effect on ADL: Limits activities Timing: Constant Modifying factors: injections BP: (!) 152/83  HR: 84  Gabrielle Pineda comes in today for post-procedure evaluation.  Further details on both, my assessment(s), as well as the proposed treatment plan, please see below.  Post-Procedure Assessment  07/18/2018 Procedure: Right C4, C5, C6, C7 cervical facet medial branch nerve block Pre-procedure pain score:  9/10 Post-procedure pain score: 0/10         Influential Factors: BMI: 21.12 kg/m Intra-procedural challenges: None observed.         Assessment challenges: None detected.              Reported side-effects: None.        Post-procedural adverse reactions or complications: None reported         Sedation: Please see nurses note. When no sedatives are used, the analgesic levels obtained are directly associated to the effectiveness of the local anesthetics. However, when sedation is provided, the level of analgesia obtained during the initial 1 hour following the  intervention, is  believed to be the result of a combination of factors. These factors may include, but are not limited to: 1. The effectiveness of the local anesthetics used. 2. The effects of the analgesic(s) and/or anxiolytic(s) used. 3. The degree of discomfort experienced by the patient at the time of the procedure. 4. The patients ability and reliability in recalling and recording the events. 5. The presence and influence of possible secondary gains and/or psychosocial factors. Reported result: Relief experienced during the 1st hour after the procedure:100%   (Ultra-Short Term Relief)            Interpretative annotation: Clinically appropriate result. Analgesia during this period is likely to be Local Anesthetic and/or IV Sedative (Analgesic/Anxiolytic) related.          Effects of local anesthetic: The analgesic effects attained during this period are directly associated to the localized infiltration of local anesthetics and therefore cary significant diagnostic value as to the etiological location, or anatomical origin, of the pain. Expected duration of relief is directly dependent on the pharmacodynamics of the local anesthetic used. Long-acting (4-6 hours) anesthetics used.  Reported result: Relief during the next 4 to 6 hour after the procedure:100%   (Short-Term Relief)            Interpretative annotation: Clinically appropriate result. Analgesia during this period is likely to be Local Anesthetic-related.          Long-term benefit: Defined as the period of time past the expected duration of local anesthetics (1 hour for short-acting and 4-6 hours for long-acting). With the possible exception of prolonged sympathetic blockade from the local anesthetics, benefits during this period are typically attributed to, or associated with, other factors such as analgesic sensory neuropraxia, antiinflammatory effects, or beneficial biochemical changes provided by agents other than the local  anesthetics.  Reported result: Extended relief following procedure:80% for approximately 6 weeks   (Long-Term Relief)            Interpretative annotation: Clinically possible results. Good relief. No permanent benefit expected. Inflammation plays a part in the etiology to the pain.          Current benefits: Defined as reported results that persistent at this point in time.   Analgesia: 25-50 %            Function: Somewhat improved ROM: Somewhat improved Interpretative annotation: Recurrence of symptoms. No permanent benefit expected. Effective diagnostic intervention.          Interpretation: Results would suggest a successful diagnostic and therapeutic intervention.                  Plan:  Repeat treatment or therapy and compare extent and duration of benefits.                Laboratory Chemistry  Inflammation Markers (CRP: Acute Phase) (ESR: Chronic Phase) No results found for: CRP, ESRSEDRATE, LATICACIDVEN                       Rheumatology Markers No results found for: RF, ANA, LABURIC, URICUR, LYMEIGGIGMAB, LYMEABIGMQN, HLAB27                      Renal Function Markers Lab Results  Component Value Date   BUN 5 (L) 04/14/2018   CREATININE 0.71 04/14/2018   BCR 7 (L) 04/14/2018   GFRAA 106 04/14/2018   GFRNONAA 92 04/14/2018  Hepatic Function Markers Lab Results  Component Value Date   AST 20 04/14/2018   ALT 14 04/14/2018   ALBUMIN 4.2 04/14/2018   ALKPHOS 107 04/14/2018   LIPASE 63 03/23/2018                        Electrolytes Lab Results  Component Value Date   NA 140 04/14/2018   K 3.8 04/14/2018   CL 105 04/14/2018   CALCIUM 9.4 04/14/2018                        Neuropathy Markers Lab Results  Component Value Date   HGBA1C 5.0 04/14/2018                        CNS Tests No results found for: COLORCSF, APPEARCSF, RBCCOUNTCSF, WBCCSF, POLYSCSF, LYMPHSCSF, EOSCSF, PROTEINCSF, GLUCCSF, JCVIRUS, CSFOLI, IGGCSF                       Bone Pathology Markers No results found for: VD25OH, VD125OH2TOT, [id number], [id number], 25OHVITD1, 25OHVITD2, 25OHVITD3, TESTOFREE, TESTOSTERONE                       Coagulation Parameters Lab Results  Component Value Date   PLT 268 04/14/2018                        Cardiovascular Markers Lab Results  Component Value Date   HGB 12.2 04/14/2018   HCT 35.8 04/14/2018                         CA Markers No results found for: CEA, CA125, LABCA2                      Note: Lab results reviewed.  Recent Diagnostic Imaging Results  DG Chest 2 View CLINICAL DATA:  Pt states she has productive cough, congestion, fever, headache, body aches, SOB for 3.5 weeks. History of COPD, HTN, smoker. shielded  EXAM: CHEST - 2 VIEW  COMPARISON:  12/31/2014  FINDINGS: Lungs are hyperinflated. Minimal linear scarring or subsegmental atelectasis in the lingula. Lungs otherwise clear.  Heart size normal.  No effusion.  Cervical fixation hardware partially seen.  IMPRESSION: Stable hyperinflation. Minimal lingular scarring or subsegmental atelectasis.  Electronically Signed   By: Lucrezia Europe M.D.   On: 08/22/2018 09:36  Complexity Note: Imaging results reviewed. Results shared with Ms. Roda Shutters, using Layman's terms.                         Meds   Current Outpatient Medications:  .  ANORO ELLIPTA 62.5-25 MCG/INH AEPB, , Disp: , Rfl:  .  atorvastatin (LIPITOR) 40 MG tablet, TAKE 1 TABLET BY MOUTH ONCE DAILY, Disp: 90 tablet, Rfl: 3 .  b complex vitamins tablet, Take 1 tablet by mouth daily. B12, Disp: , Rfl:  .  busPIRone (BUSPAR) 30 MG tablet, Take 1 tablet (30 mg total) by mouth 3 (three) times daily., Disp: 90 tablet, Rfl: 1 .  diclofenac sodium (VOLTAREN) 1 % GEL, Apply topically 4 (four) times daily., Disp: , Rfl:  .  diltiazem (CARDIZEM CD) 180 MG 24 hr capsule, Take 1 capsule (180 mg total) by mouth daily., Disp: 90 capsule, Rfl: 3 .  ezetimibe (ZETIA) 10 MG tablet,  Take  1 tablet (10 mg total) by mouth daily., Disp: 90 tablet, Rfl: 3 .  fluocinonide ointment (LIDEX) 1.58 %, Apply 1 application topically 2 (two) times daily., Disp: , Rfl:  .  furosemide (LASIX) 20 MG tablet, Take 1 tablet (20 mg total) by mouth daily as needed for fluid., Disp: 30 tablet, Rfl: 3 .  gabapentin (NEURONTIN) 600 MG tablet, 600 mg 3 times daily.  Okay to increase nighttime dose to 1200 mg., Disp: 120 tablet, Rfl: 5 .  guaiFENesin (MUCINEX) 600 MG 12 hr tablet, Take 1,200 mg by mouth 2 (two) times daily., Disp: , Rfl:  .  ibuprofen (ADVIL,MOTRIN) 200 MG tablet, Take 200 mg by mouth every 6 (six) hours as needed. For pain, Disp: , Rfl:  .  leflunomide (ARAVA) 10 MG tablet, Take 10 mg by mouth daily., Disp: , Rfl:  .  Multiple Vitamin (MULTIVITAMIN) tablet, Take 1 tablet by mouth daily.  , Disp: , Rfl:  .  Nutritional Supplements (ESTROVEN ENERGY PO), Take by mouth daily., Disp: , Rfl:  .  omeprazole (PRILOSEC) 20 MG capsule, Take 20 mg by mouth daily., Disp: , Rfl:  .  sertraline (ZOLOFT) 100 MG tablet, Take 1 tablet (100 mg total) by mouth daily., Disp: 90 tablet, Rfl: 1 .  topiramate (TOPAMAX) 25 MG tablet, Take 1 tablet (25 mg total) by mouth 2 (two) times daily., Disp: 60 tablet, Rfl: 5 .  traZODone (DESYREL) 50 MG tablet, Take 1-2 tablets (50-100 mg total) by mouth at bedtime., Disp: 180 tablet, Rfl: 1 .  valACYclovir (VALTREX) 1000 MG tablet, TAKE TWO TABLETS BY MOUTH TWICE DAILY FOR 5 DAYS AS NEEDED, Disp: 180 tablet, Rfl: 3 .  venlafaxine XR (EFFEXOR-XR) 75 MG 24 hr capsule, Take 3 capsules (225 mg total) by mouth daily with breakfast., Disp: 270 capsule, Rfl: 1 .  XIIDRA 5 % SOLN, Place 1 drop into both eyes daily., Disp: , Rfl:  .  atorvastatin (LIPITOR) 40 MG tablet, , Disp: , Rfl:  .  busPIRone (BUSPAR) 30 MG tablet, , Disp: , Rfl:  .  CARTIA XT 180 MG 24 hr capsule, , Disp: , Rfl:  .  gabapentin (NEURONTIN) 600 MG tablet, , Disp: , Rfl:  .  oxyCODONE (OXY IR/ROXICODONE)  5 MG immediate release tablet, TAKE 1 TABLET BY MOUTH EVERY 4 HOURS AS NEEDED FOR PAIN FORUP TO 5 DAYS, Disp: , Rfl: 0  ROS  Constitutional: Denies any fever or chills Gastrointestinal: No reported hemesis, hematochezia, vomiting, or acute GI distress Musculoskeletal: Denies any acute onset joint swelling, redness, loss of ROM, or weakness Neurological: No reported episodes of acute onset apraxia, aphasia, dysarthria, agnosia, amnesia, paralysis, loss of coordination, or loss of consciousness  Allergies  Ms. Cottam is allergic to plaquenil [hydroxychloroquine].  PFSH  Drug: Ms. Yum  reports no history of drug use. Alcohol:  reports no history of alcohol use. Tobacco:  reports that she has been smoking. She has a 40.00 pack-year smoking history. She has never used smokeless tobacco. Medical:  has a past medical history of Alcohol abuse, Anemia, Arthritis, Cervicalgia, COPD (chronic obstructive pulmonary disease) ([place]), Depression, GERD (gastroesophageal reflux disease), Heart murmur, Other and unspecified hyperlipidemia, and Tachycardia. Surgical: Ms. Jolin  has a past surgical history that includes neck disc surgery; Breast enhancement surgery (1981); Esophagogastroduodenoscopy; Tonsillectomy and adenoidectomy (1973 ); Carpal tunnel release (11/11/2011); Carpal tunnel release (2013); Rotator cuff repair (06/16/2016); Rotator cuff repair; Colonoscopy with propofol (N/A, 11/01/2017); and Augmentation mammaplasty (Bilateral, 1982). Family: family history includes  Alcohol abuse in her father and mother; Bipolar disorder in her mother; Pancreatic cancer in her father; Suicidality in her mother.  Constitutional Exam  General appearance: Well nourished, well developed, and well hydrated. In no apparent acute distress Vitals:   09/13/18 0934 09/13/18 0936  BP: (!) 152/83 (!) 152/83  Pulse: 84   Temp: 98.5 F (36.9 C)   SpO2:  100%  Weight: 143 lb (64.9 kg)   Height: 5' 9"  (1.753 m)     BMI Assessment: Estimated body mass index is 21.12 kg/m as calculated from the following:   Height as of this encounter: 5' 9"  (1.753 m).   Weight as of this encounter: 143 lb (64.9 kg).  BMI interpretation table: BMI level Category Range association with higher incidence of chronic pain  <18 kg/m2 Underweight   18.5-24.9 kg/m2 Ideal body weight   25-29.9 kg/m2 Overweight Increased incidence by 20%  30-34.9 kg/m2 Obese (Class I) Increased incidence by 68%  35-39.9 kg/m2 Severe obesity (Class II) Increased incidence by 136%  >40 kg/m2 Extreme obesity (Class III) Increased incidence by 254%   Patient's current BMI Ideal Body weight  Body mass index is 21.12 kg/m. Ideal body weight: 66.2 kg (145 lb 15.1 oz)   BMI Readings from Last 4 Encounters:  09/13/18 21.12 kg/m  08/22/18 20.08 kg/m  07/28/18 20.27 kg/m  07/18/18 21.41 kg/m   Wt Readings from Last 4 Encounters:  09/13/18 143 lb (64.9 kg)  08/22/18 136 lb (61.7 kg)  07/28/18 137 lb 4 oz (62.3 kg)  07/18/18 145 lb (65.8 kg)  Psych/Mental status: Alert, oriented x 3 (person, place, & time)       Eyes: PERLA Respiratory: No evidence of acute respiratory distress  Cervical Spine Area Exam  Skin & Axial Inspection: Well healed scar from previous spine surgery detected Alignment: Symmetrical Functional ROM: Improved after treatment      Stability: No instability detected Muscle Tone/Strength: Functionally intact. No obvious neuro-muscular anomalies detected. Sensory (Neurological): Dermatomal pain pattern R C6,7,C8 Palpation: No palpable anomalies              Upper Extremity (UE) Exam    Side: Right upper extremity  Side: Left upper extremity  Skin & Extremity Inspection: Skin color, temperature, and hair growth are WNL. No peripheral edema or cyanosis. No masses, redness, swelling, asymmetry, or associated skin lesions. No contractures.  Skin & Extremity Inspection: Skin color, temperature, and hair growth are WNL. No  peripheral edema or cyanosis. No masses, redness, swelling, asymmetry, or associated skin lesions. No contractures.  Functional ROM: Decreased ROM for shoulder and elbow  Functional ROM: Unrestricted ROM          Muscle Tone/Strength: Functionally intact. No obvious neuro-muscular anomalies detected.  Muscle Tone/Strength: Functionally intact. No obvious neuro-muscular anomalies detected.  Sensory (Neurological): Arthropathic arthralgia          Sensory (Neurological): Unimpaired          Palpation: No palpable anomalies              Palpation: No palpable anomalies              Provocative Test(s):  Phalen's test: deferred Tinel's test: deferred Apley's scratch test (touch opposite shoulder):  Action 1 (Across chest): Decreased ROM Action 2 (Overhead): Decreased ROM Action 3 (LB reach): Decreased ROM   Provocative Test(s):  Phalen's test: deferred Tinel's test: deferred Apley's scratch test (touch opposite shoulder):  Action 1 (Across chest): deferred Action 2 (Overhead): deferred Action  3 (LB reach): deferred    Thoracic Spine Area Exam  Skin & Axial Inspection: No masses, redness, or swelling Alignment: Symmetrical Functional ROM: Unrestricted ROM Stability: No instability detected Muscle Tone/Strength: Functionally intact. No obvious neuro-muscular anomalies detected. Sensory (Neurological): Unimpaired Muscle strength & Tone: No palpable anomalies  Lumbar Spine Area Exam  Skin & Axial Inspection: No masses, redness, or swelling Alignment: Symmetrical Functional ROM: Unrestricted ROM       Stability: No instability detected Muscle Tone/Strength: Functionally intact. No obvious neuro-muscular anomalies detected. Sensory (Neurological): Unimpaired Palpation: No palpable anomalies       Provocative Tests: Hyperextension/rotation test: deferred today       Lumbar quadrant test (Kemp's test): deferred today       Lateral bending test: deferred today       Patrick's Maneuver:  deferred today                   FABER* test: deferred today                   S-I anterior distraction/compression test: deferred today         S-I lateral compression test: deferred today         S-I Thigh-thrust test: deferred today         S-I Gaenslen's test: deferred today         *(Flexion, ABduction and External Rotation)  Gait & Posture Assessment  Ambulation: Unassisted Gait: Relatively normal for age and body habitus Posture: WNL   Lower Extremity Exam    Side: Right lower extremity  Side: Left lower extremity  Stability: No instability observed          Stability: No instability observed          Skin & Extremity Inspection: Skin color, temperature, and hair growth are WNL. No peripheral edema or cyanosis. No masses, redness, swelling, asymmetry, or associated skin lesions. No contractures.  Skin & Extremity Inspection: Skin color, temperature, and hair growth are WNL. No peripheral edema or cyanosis. No masses, redness, swelling, asymmetry, or associated skin lesions. No contractures.  Functional ROM: Unrestricted ROM                  Functional ROM: Unrestricted ROM                  Muscle Tone/Strength: Functionally intact. No obvious neuro-muscular anomalies detected.  Muscle Tone/Strength: Functionally intact. No obvious neuro-muscular anomalies detected.  Sensory (Neurological): Unimpaired        Sensory (Neurological): Unimpaired        DTR: Patellar: deferred today Achilles: deferred today Plantar: deferred today  DTR: Patellar: deferred today Achilles: deferred today Plantar: deferred today  Palpation: No palpable anomalies  Palpation: No palpable anomalies   Assessment  Primary Diagnosis & Pertinent Problem List: The primary encounter diagnosis was Spondylosis of cervical region without myelopathy or radiculopathy. Diagnoses of Neuropathy, DDD (degenerative disc disease), cervical, Cervicalgia, and Chronic pain syndrome were also pertinent to this  visit.  Status Diagnosis  Having a Flare-up Controlled Controlled 1. Spondylosis of cervical region without myelopathy or radiculopathy   2. Neuropathy   3. DDD (degenerative disc disease), cervical   4. Cervicalgia   5. Chronic pain syndrome      Repeat Right C4,5,6,7 cervical facet medial branch nerve block.  Refill of Topamax and gabapentin as below.  Time Note: Greater than 50% of the 25 minute(s) of face-to-face time spent with Ms. Chandran, was spent  in counseling/coordination of care regarding: the appropriate use of the pain scale, Ms. Johndrow's primary cause of pain, the treatment plan, treatment alternatives, the risks and possible complications of proposed treatment, the appropriate use of her medications and realistic expectations.  Plan of Care  Pharmacotherapy (Medications Ordered): Meds ordered this encounter  Medications  . topiramate (TOPAMAX) 25 MG tablet    Sig: Take 1 tablet (25 mg total) by mouth 2 (two) times daily.    Dispense:  60 tablet    Refill:  5  . gabapentin (NEURONTIN) 600 MG tablet    Sig: 600 mg 3 times daily.  Okay to increase nighttime dose to 1200 mg.    Dispense:  120 tablet    Refill:  5   Lab-work, procedure(s), and/or referral(s): Orders Placed This Encounter  Procedures  . CERVICAL FACET (MEDIAL BRANCH NERVE BLOCK)    Provider-requested follow-up: Return for Procedure.  Future Appointments  Date Time Provider [place]  02/09/2019 11:30 AM [name], MD [facility] None    Primary Care Physician: Rubye Beach Location: [facility] Note by: [name], M.D Date: 09/13/2018; Time: 10:32 AM  Patient Instructions  Preparing for Procedure with Sedation Instructions: . Oral Intake: Do not eat or drink anything for at least 8 hours prior to your procedure. . Transportation: Public transportation is not allowed. Bring an adult driver. The driver must be physically present in our  waiting room before any procedure can be started. Marland Kitchen Physical Assistance: Bring an adult capable of physically assisting you, in the event you need help. . Blood Pressure Medicine: Take your blood pressure medicine with a sip of water the morning of the procedure. . Insulin: Take only  of your normal insulin dose. . Preventing infections: Shower with an antibacterial soap the morning of your procedure. . Build-up your immune system: Take 1000 mg of Vitamin C with every meal (3 times a day) the day prior to your procedure. . Pregnancy: If you are pregnant, call and cancel the procedure. . Sickness: If you have a cold, fever, or any active infections, call and cancel the procedure. . Arrival: You must be in the facility at least 30 minutes prior to your scheduled procedure. . Children: Do not bring children with you. . Dress appropriately: Bring dark clothing that you would not mind if they get stained. . Valuables: Do not bring any jewelry or valuables. Procedure appointments are reserved for interventional treatments only. Marland Kitchen No Prescription Refills. . No medication changes will be discussed during procedure appointments. No disability issues will be discussed.Post-procedure Information What to expect: Most procedures involve the use of a local anesthetic (numbing medicine), and a steroid (anti-inflammatory medicine).  The local anesthetics may cause temporary numbness and weakness of the legs or arms, depending on the location of the block. This numbness/weakness may last 4-6 hours, depending on the local anesthetic used. In rare instances, it can last up to 24 hours. While numb, you must be very careful not to injure the extremity.  After any procedure, you could expect the pain to get better within 15-20 minutes. This relief is temporary and may last 4-6 hours. Once the local anesthetics wears off, you could experience discomfort, possibly more than usual, for up to 10 (ten) days. In the case  of radiofrequencies, it may last up to 6 weeks. Surgeries may take up to 8 weeks for the healing process. The discomfort is due to the irritation caused by needles going through  skin and muscle. To minimize the discomfort, we recommend using ice the first day, and heat from then on. The ice should be applied for 15 minutes on, and 15 minutes off. Keep repeating this cycle until bedtime. Avoid applying the ice directly to the skin, to prevent frostbite. Heat should be used daily, until the pain improves (4-10 days). Be careful not to burn yourself.  Occasionally you may experience muscle spasms or cramps. These occur as a consequence of the irritation caused by the needle sticks to the muscle and the blood that will inevitably be lost into the surrounding muscle tissue. Blood tends to be very irritating to tissues, which tend to react by going into spasm. These spasms may start the same day of your procedure, but they may also take days to develop. This late onset type of spasm or cramp is usually caused by electrolyte imbalances triggered by the steroids, at the level of the kidney. Cramps and spasms tend to respond well to muscle relaxants, multivitamins (some are triggered by the procedure, but may have their origins in vitamin deficiencies), and "Gatorade", or any sports drinks that can replenish any electrolyte imbalances. (If you are a diabetic, ask your pharmacist to get you a sugar-free brand.) Warm showers or baths may also be helpful. Stretching exercises are highly recommended. General Instructions:  Be alert for signs of possible infection: redness, swelling, heat, red streaks, elevated temperature, and/or fever. These typically appear 4 to 6 days after the procedure. Immediately notify your doctor if you experience unusual bleeding, difficulty breathing, or loss of bowel or bladder control. If you experience increased pain, do not increase your pain medicine intake, unless instructed by your pain  physician. Post-Procedure Care:  Be careful in moving about. Muscle spasms in the area of the injection may occur. Applying ice or heat to the area is often helpful. The incidence of spinal headaches after epidural injections ranges between 1.4% and 6%. If you develop a headache that does not seem to respond to conservative therapy, please let your physician know. This can be treated with an epidural blood patch.   Post-procedure numbness or redness is to be expected, however it should average 4 to 6 hours. If numbness and weakness of your extremities begins to develop 4 to 6 hours after your procedure, and is felt to be progressing and worsening, immediately contact your physician.   Diet:  If you experience nausea, do not eat until this sensation goes away. If you had a "Stellate Ganglion Block" for upper extremity "Reflex Sympathetic Dystrophy", do not eat or drink until your hoarseness goes away. In any case, always start with liquids first and if you tolerate them well, then slowly progress to more solid foods. Activity:  For the first 4 to 6 hours after the procedure, use caution in moving about as you may experience numbness and/or weakness. Use caution in cooking, using household electrical appliances, and climbing steps. If you need to reach your Doctor call our office: [phone] Monday-Thursday 8:00 am - 4:00 PM    Fridays: Closed     In case of an emergency: In case of emergency, call 911 or go to the nearest emergency room and have the physician there call us.  Interpretation of Procedure Every nerve block has two components: a diagnostic component, and a treatment component. Unrealistic expectations are the most common causes of "perceived failure".  In a perfect world, a single nerve block should be able to completely and permanently eliminate the  pain. Sadly, the world is not perfect.  Most pain management nerve blocks are performed using local anesthetics and steroids. Steroids  are responsible for any long-term benefit that you may experience. Their purpose is to decrease any chronic swelling that may exist in the area. Steroids begin to work immediately after being injected. However, most patients will not experience any benefits until 5 to 10 days after the injection, when the swelling has come down to the point where they can tell a difference. Steroids will only help if there is swelling to be treated. As such, they can assist with the diagnosis. If effective, they suggest an inflammatory component to the pain, and if ineffective, they rule out inflammation as the main cause or component of the problem. If the problem is one of mechanical compression, you will get no benefit from those steroids.   In the case of local anesthetics, they have a crucial role in the diagnosis of your condition. Most will begin to work within15 to 20 minutes after injection. The duration will depend on the type used (short- vs. Long-acting). It is of outmost importance that patients keep tract of their pain, after the procedure. To assist with this matter, a "Post-procedure Pain Diary" is provided. Make sure to complete it and to bring it back to your follow-up appointment.  As long as the patient keeps accurate, detailed records of their symptoms after every procedure, and returns to have those interpreted, every procedure will provide [place] with invaluable information. Even a block that does not provide the patient with any relief, will always provide [place] with information about the mechanism and the origin of the pain. The only time a nerve block can be considered a waste of time is when patients do not keep track of the results, or do not keep their post-procedure appointment.  Reporting the results back to your physician The Pain Score  Pain is a subjective complaint. It cannot be seen, touched, or measured. We depend entirely on the patient's report of the pain in order to assess your condition and  treatment. To evaluate the pain, we use a pain scale, where "0" means "No Pain", and a "10" is "the worst possible pain that you can even imagine" (i.e. something like been eaten alive by a shark or being torn apart by a lion).   You will frequently be asked to rate your pain. Please be as accurate, remember that medical decisions will be based on your responses. Please do not rate your pain above a 10. Doing so is actually interpreted as "symptom magnification" (exaggeration), as well as lack of understanding with regards to the scale. To put this into perspective, when you tell us that your pain is at a 10 (ten), what you are saying is that there is nothing we can do to make this pain any worse. (Carefully think about that.)Facet Blocks Patient Information  Description: The facets are joints in the spine between the vertebrae.  Like any joints in the body, facets can become irritated and painful.  Arthritis can also effect the facets.  By injecting steroids and local anesthetic in and around these joints, we can temporarily block the nerve supply to them.  Steroids act directly on irritated nerves and tissues to reduce selling and inflammation which often leads to decreased pain.  Facet blocks may be done anywhere along the spine from the neck to the low back depending upon the location of your pain.   After numbing the skin with  local anesthetic (like Novocaine), a small needle is passed onto the facet joints under x-ray guidance.  You may experience a sensation of pressure while this is being done.  The entire block usually lasts about 15-25 minutes.   Conditions which may be treated by facet blocks:  Low back/buttock pain Neck/shoulder pain Certain types of headaches  Preparation for the injection:  Do not eat any solid food or dairy products within 8 hours of your appointment. You may drink clear liquid up to 3 hours before appointment.  Clear liquids include water, black coffee, juice or  soda.  No milk or cream please. You may take your regular medication, including pain medications, with a sip of water before your appointment.  Diabetics should hold regular insulin (if taken separately) and take 1/2 normal NPH dose the morning of the procedure.  Carry some sugar containing items with you to your appointment. A driver must accompany you and be prepared to drive you home after your procedure. Bring all your current medications with you. An IV may be inserted and sedation may be given at the discretion of the physician. A blood pressure cuff, EKG and other monitors will often be applied during the procedure.  Some patients may need to have extra oxygen administered for a short period. You will be asked to provide medical information, including your allergies and medications, prior to the procedure.  We must know immediately if you are taking blood thinners (like Coumadin/Warfarin) or if you are allergic to IV iodine contrast (dye).  We must know if you could possible be pregnant.  Possible side-effects:  Bleeding from needle site Infection (rare, may require surgery) Nerve injury (rare) Numbness & tingling (temporary) Difficulty urinating (rare, temporary) Spinal headache (a headache worse with upright posture) Light-headedness (temporary) Pain at injection site (serveral days) Decreased blood pressure (rare, temporary) Weakness in arm/leg (temporary) Pressure sensation in back/neck (temporary)   Call if you experience:  Fever/chills associated with headache or increased back/neck pain Headache worsened by an upright position New onset, weakness or numbness of an extremity below the injection site Hives or difficulty breathing (go to the emergency room) Inflammation or drainage at the injection site(s) Severe back/neck pain greater than usual New symptoms which are concerning to you  Please note:  Although the local anesthetic injected can often make your back or  neck feel good for several hours after the injection, the pain will likely return. It takes 3-7 days for steroids to work.  You may not notice any pain relief for at least one week.  If effective, we will often do a series of 2-3 injections spaced 3-6 weeks apart to maximally decrease your pain.  After the initial series, you may be a candidate for a more permanent nerve block of the facets.  If you have any questions, please call [phone] . [facility]

## 2018-09-14 ENCOUNTER — Encounter: Payer: Self-pay | Admitting: Physician Assistant

## 2018-09-16 ENCOUNTER — Encounter: Payer: Self-pay | Admitting: Physician Assistant

## 2018-09-16 ENCOUNTER — Ambulatory Visit (INDEPENDENT_AMBULATORY_CARE_PROVIDER_SITE_OTHER): Payer: BLUE CROSS/BLUE SHIELD | Admitting: Physician Assistant

## 2018-09-16 VITALS — BP 147/111 | HR 88 | Temp 98.2°F | Resp 16 | Wt 142.0 lb

## 2018-09-16 DIAGNOSIS — B001 Herpesviral vesicular dermatitis: Secondary | ICD-10-CM

## 2018-09-16 DIAGNOSIS — J440 Chronic obstructive pulmonary disease with acute lower respiratory infection: Secondary | ICD-10-CM

## 2018-09-16 DIAGNOSIS — J0140 Acute pansinusitis, unspecified: Secondary | ICD-10-CM | POA: Diagnosis not present

## 2018-09-16 DIAGNOSIS — R05 Cough: Secondary | ICD-10-CM

## 2018-09-16 DIAGNOSIS — F1010 Alcohol abuse, uncomplicated: Secondary | ICD-10-CM | POA: Diagnosis not present

## 2018-09-16 DIAGNOSIS — H66001 Acute suppurative otitis media without spontaneous rupture of ear drum, right ear: Secondary | ICD-10-CM

## 2018-09-16 DIAGNOSIS — F321 Major depressive disorder, single episode, moderate: Secondary | ICD-10-CM

## 2018-09-16 DIAGNOSIS — R059 Cough, unspecified: Secondary | ICD-10-CM

## 2018-09-16 DIAGNOSIS — J209 Acute bronchitis, unspecified: Secondary | ICD-10-CM

## 2018-09-16 MED ORDER — ACYCLOVIR 5 % EX CREA: 1.0000 "application " | TOPICAL_CREAM | CUTANEOUS | 0 refills | Status: DC

## 2018-09-16 MED ORDER — ARIPIPRAZOLE 5 MG PO TABS: 5.0000 mg | ORAL_TABLET | Freq: Every day | ORAL | 0 refills | Status: DC

## 2018-09-16 MED ORDER — AMOXICILLIN-POT CLAVULANATE 875-125 MG PO TABS: 1.0000 | ORAL_TABLET | Freq: Two times a day (BID) | ORAL | 0 refills | Status: DC

## 2018-09-16 MED ORDER — PREDNISONE 10 MG PO TABS: ORAL_TABLET | ORAL | 0 refills | Status: DC

## 2018-09-16 MED ORDER — BENZONATATE 200 MG PO CAPS: 200.0000 mg | ORAL_CAPSULE | Freq: Two times a day (BID) | ORAL | 0 refills | Status: DC | PRN

## 2018-09-16 MED ORDER — FLUTICASONE PROPIONATE 50 MCG/ACT NA SUSP: 2.0000 | Freq: Every day | NASAL | 6 refills | Status: DC

## 2018-09-16 NOTE — Patient Instructions (Signed)
Aripiprazole tablets What is this medicine? ARIPIPRAZOLE (ay ri PIP ray zole) is an atypical antipsychotic. It is used to treat schizophrenia and bipolar disorder, also known as manic-depression. It is also used to treat Tourette's disorder and some symptoms of autism. This medicine may also be used in combination with antidepressants to treat major depressive disorder. This medicine may be used for other purposes; ask your health care provider or pharmacist if you have questions. COMMON BRAND NAME(S): Abilify What should I tell my health care provider before I take this medicine? They need to know if you have any of these conditions: -dehydration -dementia -diabetes -heart disease -history of stroke -low blood counts, like low white cell, platelet, or red cell counts -Parkinson's disease -seizures -suicidal thoughts, plans, or attempt; a previous suicide attempt by you or a family member -an unusual or allergic reaction to aripiprazole, other medicines, foods, dyes, or preservatives -pregnant or trying to get pregnant -breast-feeding How should I use this medicine? Take this medicine by mouth with a glass of water. Follow the directions on the prescription label. You can take this medicine with or without food. Take your doses at regular intervals. Do not take your medicine more often than directed. Do not stop taking except on the advice of your doctor or health care professional. A special MedGuide will be given to you by the pharmacist with each prescription and refill. Be sure to read this information carefully each time. Talk to your pediatrician regarding the use of this medicine in children. While this drug may be prescribed for children as young as 6 years of age for selected conditions, precautions do apply. Overdosage: If you think you have taken too much of this medicine contact a poison control center or emergency room at once. NOTE: This medicine is only for you. Do not share  this medicine with others. What if I miss a dose? If you miss a dose, take it as soon as you can. If it is almost time for your next dose, take only that dose. Do not take double or extra doses. What may interact with this medicine? Do not take this medicine with any of the following medications: -brexpiprazole -cisapride -dofetilide -dronedarone -metoclopramide -pimozide -thioridazine This medicine may also interact with the following medications: -alcohol -carbamazepine -certain medicines for anxiety or sleep -certain medicines for blood pressure -certain medicines for fungal infections like ketoconazole, fluconazole, posaconazole, and itraconazole -clarithromycin -fluoxetine -other medicines that prolong the QT interval (cause an abnormal heart rhythm) -paroxetine -quinidine -rifampin This list may not describe all possible interactions. Give your health care provider a list of all the medicines, herbs, non-prescription drugs, or dietary supplements you use. Also tell them if you smoke, drink alcohol, or use illegal drugs. Some items may interact with your medicine. What should I watch for while using this medicine? Visit your doctor or health care professional for regular checks on your progress. It may be several weeks before you see the full effects of this medicine. Do not suddenly stop taking this medicine. You may need to gradually reduce the dose. Patients and their families should watch out for worsening depression or thoughts of suicide. Also watch out for sudden changes in feelings such as feeling anxious, agitated, panicky, irritable, hostile, aggressive, impulsive, severely restless, overly excited and hyperactive, or not being able to sleep. If this happens, especially at the beginning of antidepressant treatment or after a change in dose, call your health care professional. You may get dizzy or drowsy. Do   not drive, use machinery, or do anything that needs mental  alertness until you know how this medicine affects you. Do not stand or sit up quickly, especially if you are an older patient. This reduces the risk of dizzy or fainting spells. Alcohol can increase dizziness and drowsiness. Avoid alcoholic drinks. This medicine can reduce the response of your body to heat or cold. Dress warmly in cold weather and stay hydrated in hot weather. If possible, avoid extreme temperatures like saunas, hot tubs, very hot or cold showers, or activities that can cause dehydration such as vigorous exercise. This medicine may cause dry eyes and blurred vision. If you wear contact lenses, you may feel some discomfort. Lubricating drops may help. See your eye doctor if the problem does not go away or is severe. If you notice an increased hunger or thirst, different from your normal hunger or thirst, or if you find that you have to urinate more frequently, you should contact your health care provider as soon as possible. You may need to have your blood sugar monitored. This medicine may cause changes in your blood sugar levels. You should monitor your blood sugar frequently if you have diabetes. There have been reports of uncontrollable and strong urges to gamble, binge eat, shop, and have sex while taking this medicine. If you experience any of these or other uncontrollable and strong urges while taking this medicine, you should report it to your health care provider as soon as possible. What side effects may I notice from receiving this medicine? Side effects that you should report to your doctor or health care professional as soon as possible: -allergic reactions like skin rash, itching or hives, swelling of the face, lips, or tongue -breathing problems -confusion -feeling faint or lightheaded, falls -fever or chills, sore throat -increased hunger or thirst -increased urination -joint pain -muscles pain, spasms -problems with balance, talking, walking -restlessness or need to  keep moving -seizures -suicidal thoughts or other mood changes -trouble swallowing -uncontrollable and excessive urges (examples: gambling, binge eating, shopping, having sex) -uncontrollable head, mouth, neck, arm, or leg movements -unusually weak or tired Side effects that usually do not require medical attention (report to your doctor or health care professional if they continue or are bothersome): -blurred vision -constipation -headache -nausea, vomiting -trouble sleeping -weight gain This list may not describe all possible side effects. Call your doctor for medical advice about side effects. You may report side effects to FDA at 1-800-FDA-1088. Where should I keep my medicine? Keep out of the reach of children. Store at room temperature between 15 and 30 degrees C (59 and 86 degrees F). Throw away any unused medicine after the expiration date. NOTE: This sheet is a summary. It may not cover all possible information. If you have questions about this medicine, talk to your doctor, pharmacist, or health care provider.  2019 [employer] (2018-01-27 09:13:39)  

## 2018-09-16 NOTE — Progress Notes (Signed)
Patient: Gabrielle Pineda Female    DOB: Jun 14, 1956   63 y.o.   MRN: [id number] Visit Date: 09/16/2018  Today's Provider: [name], PA-C   Chief Complaint  Patient presents with  . Personal Problem   Subjective:     HPI  Gabrielle Pineda is a 63 yr old female that comes to the office today with worsening depression and restarting drinking. She is a recovering alcoholic and had been sober for 25 years. She reports she started socially drinking again about 6 months ago. Since then she has progressively been increasing her consumption. Now she is drinking about 6 beers nightly.   She does have chronic pain syndrome and is followed by the pain clinic. She also has been applying for disability and has been denied twice. She has been unable to work due to her pain and is having financial concerns. These issues have all been contributing to worsening depression. 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    Allergies  Allergen Reactions  . Plaquenil [Hydroxychloroquine] Rash     Current Outpatient Medications:  .  ANORO ELLIPTA 62.5-25 MCG/INH AEPB, , Disp: , Rfl:  .  atorvastatin (LIPITOR) 40 MG tablet, TAKE 1 TABLET BY MOUTH ONCE DAILY, Disp: 90 tablet, Rfl: 3 .  atorvastatin (LIPITOR) 40 MG tablet, , Disp: , Rfl:  .  b complex vitamins tablet, Take 1 tablet by mouth daily. B12, Disp: , Rfl:  .  busPIRone (BUSPAR) 30 MG tablet, Take 1 tablet (30 mg total) by mouth 3 (three) times daily., Disp: 90 tablet, Rfl: 1 .  busPIRone (BUSPAR) 30 MG tablet, , Disp: , Rfl:  .  CARTIA XT 180 MG 24 hr capsule, , Disp: , Rfl:  .  diclofenac sodium (VOLTAREN) 1 % GEL, Apply topically 4 (four) times daily., Disp: , Rfl:  .  diltiazem (CARDIZEM CD) 180 MG 24 hr capsule, Take 1 capsule (180 mg total) by mouth daily., Disp: 90 capsule, Rfl: 3 .  ezetimibe (ZETIA) 10 MG tablet, Take 1 tablet (10 mg total) by mouth daily., Disp: 90 tablet, Rfl: 3 .  fluocinonide ointment (LIDEX) 6.31 %,  Apply 1 application topically 2 (two) times daily., Disp: , Rfl:  .  furosemide (LASIX) 20 MG tablet, Take 1 tablet (20 mg total) by mouth daily as needed for fluid., Disp: 30 tablet, Rfl: 3 .  gabapentin (NEURONTIN) 600 MG tablet, , Disp: , Rfl:  .  gabapentin (NEURONTIN) 600 MG tablet, 600 mg 3 times daily.  Okay to increase nighttime dose to 1200 mg., Disp: 120 tablet, Rfl: 5 .  guaiFENesin (MUCINEX) 600 MG 12 hr tablet, Take 1,200 mg by mouth 2 (two) times daily., Disp: , Rfl:  .  ibuprofen (ADVIL,MOTRIN) 200 MG tablet, Take 200 mg by mouth every 6 (six) hours as needed. For pain, Disp: , Rfl:  .  leflunomide (ARAVA) 10 MG tablet, Take 10 mg by mouth daily., Disp: , Rfl:  .  Multiple Vitamin (MULTIVITAMIN) tablet, Take 1 tablet by mouth daily.  , Disp: , Rfl:  .  Nutritional Supplements (ESTROVEN ENERGY PO), Take by mouth daily., Disp: , Rfl:  .  omeprazole (PRILOSEC) 20 MG capsule, Take 20 mg by mouth daily., Disp: , Rfl:  .  oxyCODONE (OXY IR/ROXICODONE) 5 MG immediate release tablet, TAKE 1 TABLET BY MOUTH EVERY 4 HOURS AS NEEDED FOR PAIN FORUP TO 5 DAYS, Disp: , Rfl: 0 .  sertraline (ZOLOFT) 100 MG tablet, Take 1  tablet (100 mg total) by mouth daily., Disp: 90 tablet, Rfl: 1 .  topiramate (TOPAMAX) 25 MG tablet, Take 1 tablet (25 mg total) by mouth 2 (two) times daily., Disp: 60 tablet, Rfl: 5 .  traZODone (DESYREL) 50 MG tablet, Take 1-2 tablets (50-100 mg total) by mouth at bedtime., Disp: 180 tablet, Rfl: 1 .  valACYclovir (VALTREX) 1000 MG tablet, TAKE TWO TABLETS BY MOUTH TWICE DAILY FOR 5 DAYS AS NEEDED, Disp: 180 tablet, Rfl: 3 .  venlafaxine XR (EFFEXOR-XR) 75 MG 24 hr capsule, Take 3 capsules (225 mg total) by mouth daily with breakfast., Disp: 270 capsule, Rfl: 1 .  XIIDRA 5 % SOLN, Place 1 drop into both eyes daily., Disp: , Rfl:   Review of Systems  Constitutional: Negative for appetite change, chills, fatigue and fever.  Respiratory: Negative for chest tightness and shortness  of breath.   Cardiovascular: Negative for chest pain and palpitations.  Gastrointestinal: Negative for abdominal pain, nausea and vomiting.  Neurological: Negative for dizziness and weakness.  Psychiatric/Behavioral: Positive for agitation, dysphoric mood and sleep disturbance. Negative for self-injury and suicidal ideas.    Social History   Tobacco Use  . Smoking status: Current Every Day Smoker    Packs/day: 1.00    Years: 40.00    Pack years: 40.00  . Smokeless tobacco: Never Used  Substance Use Topics  . Alcohol use: No    Comment: recovering alcoholic Since 1610       Objective:   BP (!) 147/111 (BP Location: Left Arm, Patient Position: Sitting, Cuff Size: Normal)   Pulse 88   Temp 98.2 F (36.8 C) (Oral)   Resp 16   Wt 142 lb (64.4 kg)   SpO2 98%   BMI 20.97 kg/m  Vitals:   09/16/18 1245  BP: (!) 147/111  Pulse: 88  Resp: 16  Temp: 98.2 F (36.8 C)  TempSrc: Oral  SpO2: 98%  Weight: 142 lb (64.4 kg)     Physical Exam Vitals signs reviewed.  Constitutional:      General: She is not in acute distress.    Appearance: She is well-developed. She is not diaphoretic.  HENT:     Head: Normocephalic and atraumatic.     Right Ear: Hearing, ear canal and external ear normal. Tenderness present. A middle ear effusion is present. Tympanic membrane is erythematous and bulging.     Left Ear: Hearing, tympanic membrane, ear canal and external ear normal.     Nose: Mucosal edema and congestion present.     Right Sinus: Maxillary sinus tenderness and frontal sinus tenderness present.     Left Sinus: Maxillary sinus tenderness and frontal sinus tenderness present.     Mouth/Throat:     Mouth: Mucous membranes are moist.     Pharynx: Oropharynx is clear.  Neck:     Musculoskeletal: Normal range of motion and neck supple.  Cardiovascular:     Rate and Rhythm: Normal rate and regular rhythm.     Heart sounds: Normal heart sounds. No murmur. No friction rub. No gallop.     Pulmonary:     Effort: Pulmonary effort is normal. No respiratory distress.     Breath sounds: Wheezing (throughout) present. No rales.  Psychiatric:        Attention and Perception: Attention normal.        Mood and Affect: Mood is depressed. Affect is flat.        Speech: Speech normal.  Behavior: Behavior normal.        Thought Content: Thought content normal.        Cognition and Memory: Cognition and memory normal.        Judgment: Judgment normal.        Assessment & Plan    1. Depression, major, single episode, moderate (HCC) Worsening symptoms. Discussed using wellbutrin, adding naloxone for alcohol abuse. Patient reports she has used before but desires to try to quit on her own. Will add Abilify as below. Continue her other medications. I will see her back in 4 weeks. Call if worsening. - ARIPiprazole (ABILIFY) 5 MG tablet; Take 1 tablet (5 mg total) by mouth daily.  Dispense: 30 tablet; Refill: 0  2. Alcohol abuse See above medical treatment plan.  3. Acute non-recurrent pansinusitis Worsening symptoms that have not responded to OTC medications. Will give augmentin as below. Continue allergy medications. Stay well hydrated and get plenty of rest. Call if no symptom improvement or if symptoms worsen. - amoxicillin-clavulanate (AUGMENTIN) 875-125 MG tablet; Take 1 tablet by mouth 2 (two) times daily.  Dispense: 20 tablet; Refill: 0 - fluticasone (FLONASE) 50 MCG/ACT nasal spray; Place 2 sprays into both nostrils daily.  Dispense: 16 g; Refill: 6  4. Cough Tessalon perles for cough as below. - benzonatate (TESSALON) 200 MG capsule; Take 1 capsule (200 mg total) by mouth 2 (two) times daily as needed for cough.  Dispense: 20 capsule; Refill: 0  5. Acute bronchitis with COPD ([place]) Prednisone added for wheezes.  - predniSONE (DELTASONE) 10 MG tablet; Take 6 tabs PO on day 1&2, 5 tabs PO on day 3&4, 4 tabs PO on day 5&6, 3 tabs PO on day 7&8, 2 tabs PO on day 9&10, 1 tab  PO on day 11&12.  Dispense: 42 tablet; Refill: 0 - amoxicillin-clavulanate (AUGMENTIN) 875-125 MG tablet; Take 1 tablet by mouth 2 (two) times daily.  Dispense: 20 tablet; Refill: 0  6. Non-recurrent acute suppurative otitis media of right ear without spontaneous rupture of tympanic membrane See above medical treatment plan. - amoxicillin-clavulanate (AUGMENTIN) 875-125 MG tablet; Take 1 tablet by mouth 2 (two) times daily.  Dispense: 20 tablet; Refill: 0 - fluticasone (FLONASE) 50 MCG/ACT nasal spray; Place 2 sprays into both nostrils daily.  Dispense: 16 g; Refill: 6  7. Herpes labialis Stable. Diagnosis pulled for medication refill. Continue current medical treatment plan. - acyclovir cream (ZOVIRAX) 5 %; Apply 1 application topically every 4 (four) hours.  Dispense: 15 g; Refill: 0     [name], PA-C  [facility]

## 2018-09-17 ENCOUNTER — Other Ambulatory Visit: Payer: Self-pay | Admitting: Physician Assistant

## 2018-09-17 DIAGNOSIS — F411 Generalized anxiety disorder: Secondary | ICD-10-CM

## 2018-09-21 ENCOUNTER — Ambulatory Visit: Payer: BLUE CROSS/BLUE SHIELD | Admitting: Student in an Organized Health Care Education/Training Program

## 2018-09-30 ENCOUNTER — Ambulatory Visit (INDEPENDENT_AMBULATORY_CARE_PROVIDER_SITE_OTHER): Payer: Medicare Other | Admitting: Physician Assistant

## 2018-09-30 ENCOUNTER — Encounter: Payer: Self-pay | Admitting: Physician Assistant

## 2018-09-30 VITALS — BP 133/89 | HR 91 | Temp 98.4°F | Resp 16 | Wt 136.0 lb

## 2018-09-30 DIAGNOSIS — R059 Cough, unspecified: Secondary | ICD-10-CM

## 2018-09-30 DIAGNOSIS — R05 Cough: Secondary | ICD-10-CM

## 2018-09-30 DIAGNOSIS — F321 Major depressive disorder, single episode, moderate: Secondary | ICD-10-CM

## 2018-09-30 DIAGNOSIS — H6983 Other specified disorders of Eustachian tube, bilateral: Secondary | ICD-10-CM

## 2018-09-30 MED ORDER — PREDNISONE 10 MG (21) PO TBPK: ORAL_TABLET | ORAL | 0 refills | Status: DC

## 2018-09-30 MED ORDER — BENZONATATE 200 MG PO CAPS: 200.0000 mg | ORAL_CAPSULE | Freq: Two times a day (BID) | ORAL | 0 refills | Status: DC | PRN

## 2018-09-30 MED ORDER — ARIPIPRAZOLE 5 MG PO TABS: 10.0000 mg | ORAL_TABLET | Freq: Every day | ORAL | 1 refills | Status: DC

## 2018-09-30 NOTE — Patient Instructions (Addendum)
Flonase and prednisone for ETD   Eustachian Tube Dysfunction  Eustachian tube dysfunction refers to a condition in which a blockage develops in the narrow passage that connects the middle ear to the back of the nose (eustachian tube). The eustachian tube regulates air pressure in the middle ear by letting air move between the ear and nose. It also helps to drain fluid from the middle ear space. Eustachian tube dysfunction can affect one or both ears. When the eustachian tube does not function properly, air pressure, fluid, or both can build up in the middle ear. What are the causes? This condition occurs when the eustachian tube becomes blocked or cannot open normally. Common causes of this condition include:  Ear infections.  Colds and other infections that affect the nose, mouth, and throat (upper respiratory tract).  Allergies.  Irritation from cigarette smoke.  Irritation from stomach acid coming up into the esophagus (gastroesophageal reflux). The esophagus is the tube that carries food from the mouth to the stomach.  Sudden changes in air pressure, such as from descending in an airplane or scuba diving.  Abnormal growths in the nose or throat, such as: ? Growths that line the nose (nasal polyps). ? Abnormal growth of cells (tumors). ? Enlarged tissue at the back of the throat (adenoids). What increases the risk? You are more likely to develop this condition if:  You smoke.  You are overweight.  You are a child who has: ? Certain birth defects of the mouth, such as cleft palate. ? Large tonsils or adenoids. What are the signs or symptoms? Common symptoms of this condition include:  A feeling of fullness in the ear.  Ear pain.  Clicking or popping noises in the ear.  Ringing in the ear.  Hearing loss.  Loss of balance.  Dizziness. Symptoms may get worse when the air pressure around you changes, such as when you travel to an area of high elevation, fly on an  airplane, or go scuba diving. How is this diagnosed? This condition may be diagnosed based on:  Your symptoms.  A physical exam of your ears, nose, and throat.  Tests, such as those that measure: ? The movement of your eardrum (tympanogram). ? Your hearing (audiometry). How is this treated? Treatment depends on the cause and severity of your condition.  In mild cases, you may relieve your symptoms by moving air into your ears. This is called "popping the ears."  In more severe cases, or if you have symptoms of fluid in your ears, treatment may include: ? Medicines to relieve congestion (decongestants). ? Medicines that treat allergies (antihistamines). ? Nasal sprays or ear drops that contain medicines that reduce swelling (steroids). ? A procedure to drain the fluid in your eardrum (myringotomy). In this procedure, a small tube is placed in the eardrum to:  Drain the fluid.  Restore the air in the middle ear space. ? A procedure to insert a balloon device through the nose to inflate the opening of the eustachian tube (balloon dilation). Follow these instructions at home: Lifestyle  Do not do any of the following until your health care provider approves: ? Travel to high altitudes. ? Fly in airplanes. ? Work in a Pension scheme manager or room. ? Scuba dive.  Do not use any products that contain nicotine or tobacco, such as cigarettes and e-cigarettes. If you need help quitting, ask your health care provider.  Keep your ears dry. Wear fitted earplugs during showering and bathing. Dry your ears  completely after. General instructions  Take over-the-counter and prescription medicines only as told by your health care provider.  Use techniques to help pop your ears as recommended by your health care provider. These may include: ? Chewing gum. ? Yawning. ? Frequent, forceful swallowing. ? Closing your mouth, holding your nose closed, and gently blowing as if you are trying to blow  air out of your nose.  Keep all follow-up visits as told by your health care provider. This is important. Contact a health care provider if:  Your symptoms do not go away after treatment.  Your symptoms come back after treatment.  You are unable to pop your ears.  You have: ? A fever. ? Pain in your ear. ? Pain in your head or neck. ? Fluid draining from your ear.  Your hearing suddenly changes.  You become very dizzy.  You lose your balance. Summary  Eustachian tube dysfunction refers to a condition in which a blockage develops in the eustachian tube.  It can be caused by ear infections, allergies, inhaled irritants, or abnormal growths in the nose or throat.  Symptoms include ear pain, hearing loss, or ringing in the ears.  Mild cases are treated with maneuvers to unblock the ears, such as yawning or ear popping.  Severe cases are treated with medicines. Surgery may also be done (rare). This information is not intended to replace advice given to you by your health care provider. Make sure you discuss any questions you have with your health care provider. Document Released: 08/30/2015 Document Revised: 11/23/2017 Document Reviewed: 11/23/2017 Elsevier Interactive Patient Education  2019 [employer].

## 2018-09-30 NOTE — Progress Notes (Signed)
Patient: Gabrielle Pineda Female    DOB: [birth year]   63 y.o.   MRN: [id number] Visit Date: 09/30/2018  Today's Provider: [name], PA-C   Chief Complaint  Patient presents with  . Follow-up    Depression and Alcohol   Subjective:     HPI  Depression, Follow up:  The patient was last seen for Depression 2 weeks ago. Changes made since that visit include Discussed using wellbutrin, adding naloxone for alcohol abuse.Patient reported she has used before but desires to try to quit on her own. Abilify 5 mg was added. Patient reports that she increase the Abilify two days ago. She is taking 10 mg daily. She does report she has not yet cut back that much with her drinking. She reports she may drink one less beer, but that is it. She also mentions that no one in her family is aware of her drinking issue because she ashamed of it and she is hiding it.   She reports excellent compliance with treatment. She is not having side effects. She does feel the medication is helping slowly.   She does also mention she is feeling much better form the URI she had last time, but still having some residual cough and ear pressure.  ------------------------------------------------------------------------    Allergies  Allergen Reactions  . Plaquenil [Hydroxychloroquine] Rash     Current Outpatient Medications:  .  ANORO ELLIPTA 62.5-25 MCG/INH AEPB, , Disp: , Rfl:  .  ARIPiprazole (ABILIFY) 5 MG tablet, Take 1 tablet (5 mg total) by mouth daily., Disp: 30 tablet, Rfl: 0 .  atorvastatin (LIPITOR) 40 MG tablet, TAKE 1 TABLET BY MOUTH ONCE DAILY, Disp: 90 tablet, Rfl: 3 .  b complex vitamins tablet, Take 1 tablet by mouth daily. B12, Disp: , Rfl:  .  busPIRone (BUSPAR) 30 MG tablet, TAKE 1 TABLET BY MOUTH THREE TIMES DAILY, Disp: 90 tablet, Rfl: 5 .  diclofenac sodium (VOLTAREN) 1 % GEL, Apply topically 4 (four) times daily., Disp: , Rfl:  .  diltiazem (CARDIZEM CD) 180 MG 24 hr  capsule, Take 1 capsule (180 mg total) by mouth daily., Disp: 90 capsule, Rfl: 3 .  ezetimibe (ZETIA) 10 MG tablet, Take 1 tablet (10 mg total) by mouth daily., Disp: 90 tablet, Rfl: 3 .  fluticasone (FLONASE) 50 MCG/ACT nasal spray, Place 2 sprays into both nostrils daily., Disp: 16 g, Rfl: 6 .  furosemide (LASIX) 20 MG tablet, Take 1 tablet (20 mg total) by mouth daily as needed for fluid., Disp: 30 tablet, Rfl: 3 .  gabapentin (NEURONTIN) 600 MG tablet, 600 mg 3 times daily.  Okay to increase nighttime dose to 1200 mg., Disp: 120 tablet, Rfl: 5 .  guaiFENesin (MUCINEX) 600 MG 12 hr tablet, Take 1,200 mg by mouth 2 (two) times daily., Disp: , Rfl:  .  ibuprofen (ADVIL,MOTRIN) 200 MG tablet, Take 200 mg by mouth every 6 (six) hours as needed. For pain, Disp: , Rfl:  .  leflunomide (ARAVA) 10 MG tablet, Take 10 mg by mouth daily., Disp: , Rfl:  .  Multiple Vitamin (MULTIVITAMIN) tablet, Take 1 tablet by mouth daily.  , Disp: , Rfl:  .  Nutritional Supplements (ESTROVEN ENERGY PO), Take by mouth daily., Disp: , Rfl:  .  omeprazole (PRILOSEC) 20 MG capsule, Take 20 mg by mouth daily., Disp: , Rfl:  .  sertraline (ZOLOFT) 100 MG tablet, Take 1 tablet (100 mg total) by mouth daily., Disp: 90 tablet,  Rfl: 1 .  topiramate (TOPAMAX) 25 MG tablet, Take 1 tablet (25 mg total) by mouth 2 (two) times daily., Disp: 60 tablet, Rfl: 5 .  traZODone (DESYREL) 50 MG tablet, Take 1-2 tablets (50-100 mg total) by mouth at bedtime., Disp: 180 tablet, Rfl: 1 .  valACYclovir (VALTREX) 1000 MG tablet, TAKE TWO TABLETS BY MOUTH TWICE DAILY FOR 5 DAYS AS NEEDED, Disp: 180 tablet, Rfl: 3 .  XIIDRA 5 % SOLN, Place 1 drop into both eyes daily., Disp: , Rfl:  .  acyclovir cream (ZOVIRAX) 5 %, Apply 1 application topically every 4 (four) hours., Disp: 15 g, Rfl: 0 .  venlafaxine XR (EFFEXOR-XR) 75 MG 24 hr capsule, Take 3 capsules (225 mg total) by mouth daily with breakfast., Disp: 270 capsule, Rfl: 1  Review of Systems    Constitutional: Negative.   Respiratory: Negative.   Cardiovascular: Negative.   Gastrointestinal: Negative.   Neurological: Negative.   Psychiatric/Behavioral: Positive for dysphoric mood. Negative for self-injury and suicidal ideas. The patient is nervous/anxious.     Social History   Tobacco Use  . Smoking status: Current Every Day Smoker    Packs/day: 1.00    Years: 40.00    Pack years: 40.00  . Smokeless tobacco: Never Used  Substance Use Topics  . Alcohol use: No    Comment: recovering alcoholic Since 3419       Objective:   BP 133/89 (BP Location: Left Arm, Patient Position: Sitting, Cuff Size: Normal)   Pulse 91   Temp 98.4 F (36.9 C) (Oral)   Resp 16   Wt 136 lb (61.7 kg)   BMI 20.08 kg/m  Vitals:   09/30/18 1228  BP: 133/89  Pulse: 91  Resp: 16  Temp: 98.4 F (36.9 C)  TempSrc: Oral  Weight: 136 lb (61.7 kg)     Physical Exam Vitals signs reviewed.  Constitutional:      Appearance: Normal appearance. She is well-developed and normal weight.  HENT:     Head: Normocephalic and atraumatic.     Right Ear: Tympanic membrane is bulging.     Left Ear: Tympanic membrane is bulging.  Neck:     Musculoskeletal: Normal range of motion and neck supple.  Pulmonary:     Effort: Pulmonary effort is normal. No respiratory distress.  Neurological:     Mental Status: She is alert.  Psychiatric:        Attention and Perception: Attention normal.        Mood and Affect: Mood is depressed. Affect is tearful.        Speech: Speech normal.        Behavior: Behavior normal.        Thought Content: Thought content normal.        Judgment: Judgment normal.         Assessment & Plan    1. Depression, major, single episode, moderate (HCC) Increase dose to 10mg , and then 15mg  if needed over the next 4 weeks. I will see her back in 2-4 weeks to see how she is doing and see if she has started to decrease her alcohol consumption.  - ARIPiprazole (ABILIFY) 5 MG  tablet; Take 2 tablets (10 mg total) by mouth daily.  Dispense: 180 tablet; Refill: 1  2. Dysfunction of both eustachian tubes Will add prednisone as below for the ETD that remains most likely from post-infectious inflammation. Call if no improvements.  - predniSONE (STERAPRED UNI-PAK 21 TAB) 10 MG (21) TBPK tablet;  6 day taper; take as directed on package instructions  Dispense: 21 tablet; Refill: 0  3. Cough Tessalon perles for cough. Push fluids.  - benzonatate (TESSALON) 200 MG capsule; Take 1 capsule (200 mg total) by mouth 2 (two) times daily as needed for cough.  Dispense: 20 capsule; Refill: 0     [name], PA-C  [facility]

## 2018-10-05 ENCOUNTER — Ambulatory Visit: Payer: BLUE CROSS/BLUE SHIELD | Admitting: Student in an Organized Health Care Education/Training Program

## 2018-10-05 ENCOUNTER — Telehealth: Payer: Self-pay | Admitting: Cardiovascular Disease

## 2018-10-05 NOTE — Telephone Encounter (Signed)
Received records request [place] forwarded to [facility] for processing.  Faxed to [facility] and scanned into HIM and sent to [facility]

## 2018-10-07 ENCOUNTER — Encounter: Payer: Self-pay | Admitting: Physician Assistant

## 2018-10-07 DIAGNOSIS — F321 Major depressive disorder, single episode, moderate: Secondary | ICD-10-CM

## 2018-10-07 MED ORDER — ARIPIPRAZOLE 10 MG PO TABS: 10.0000 mg | ORAL_TABLET | Freq: Every day | ORAL | 5 refills | Status: DC

## 2018-10-28 ENCOUNTER — Ambulatory Visit (INDEPENDENT_AMBULATORY_CARE_PROVIDER_SITE_OTHER): Payer: Medicare Other | Admitting: Physician Assistant

## 2018-10-28 ENCOUNTER — Other Ambulatory Visit: Payer: Self-pay

## 2018-10-28 ENCOUNTER — Encounter: Payer: Self-pay | Admitting: Physician Assistant

## 2018-10-28 ENCOUNTER — Telehealth: Payer: Self-pay

## 2018-10-28 ENCOUNTER — Ambulatory Visit
Admission: RE | Admit: 2018-10-28 | Discharge: 2018-10-28 | Disposition: A | Discharge status: Home / Self Care | Payer: BLUE CROSS/BLUE SHIELD | Attending: Physician Assistant | Admitting: Physician Assistant

## 2018-10-28 ENCOUNTER — Ambulatory Visit
Admission: RE | Admit: 2018-10-28 | Discharge: 2018-10-28 | Disposition: A | Discharge status: Home / Self Care | Payer: BLUE CROSS/BLUE SHIELD | Source: Ambulatory Visit | Attending: Physician Assistant | Admitting: Physician Assistant

## 2018-10-28 VITALS — BP 126/80 | HR 103 | Temp 98.3°F | Resp 16 | Wt 135.0 lb

## 2018-10-28 DIAGNOSIS — M5442 Lumbago with sciatica, left side: Secondary | ICD-10-CM | POA: Diagnosis not present

## 2018-10-28 DIAGNOSIS — M5441 Lumbago with sciatica, right side: Secondary | ICD-10-CM | POA: Insufficient documentation

## 2018-10-28 DIAGNOSIS — G8929 Other chronic pain: Secondary | ICD-10-CM | POA: Diagnosis not present

## 2018-10-28 DIAGNOSIS — M545 Low back pain: Secondary | ICD-10-CM | POA: Diagnosis not present

## 2018-10-28 DIAGNOSIS — F339 Major depressive disorder, recurrent, unspecified: Secondary | ICD-10-CM | POA: Diagnosis not present

## 2018-10-28 NOTE — Telephone Encounter (Signed)
-----   Message from [name], [place] sent at 10/28/2018  4:50 PM EDT ----- Degenerative disc disease in lumbar spine with noted arthritic changes, worsening when compared to lumbar CT done in 2018.

## 2018-10-28 NOTE — Telephone Encounter (Signed)
Patient advised as below. Patient does have an appt with Dr. Holley Raring on 11/02/2018

## 2018-10-28 NOTE — Progress Notes (Signed)
Patient: Gabrielle Pineda Female    DOB: 06/13/1956   63 y.o.   MRN: [id number] Visit Date: 10/28/2018  Today's Provider: [name], PA-C   Chief Complaint  Patient presents with   Depression   Subjective:    I, Philbert Riser. Dimas, CMA, am acting as a Education administrator for [employer], PA-C.   HPI  Depression, Follow-up  She  was last seen for this 1 months ago. Changes made at last visit include Abilify 10 mg daily.   She reports good compliance with treatment. She is not having side effects.   She reports excellent tolerance of treatment. Current symptoms include: depressed mood, difficulty concentrating, fatigue and insomnia She feels she is Improved since last visit. Drinking has cut back drastically and she is no longer drinking daily. Reports just a few days ago she had a drink and she poured it out because she decided she didn't want it any longer.  ------------------------------------------------------------------------    Allergies  Allergen Reactions   Plaquenil [Hydroxychloroquine] Rash     Current Outpatient Medications:    acyclovir cream (ZOVIRAX) 5 %, Apply 1 application topically every 4 (four) hours., Disp: 15 g, Rfl: 0   ANORO ELLIPTA 62.5-25 MCG/INH AEPB, , Disp: , Rfl:    ARIPiprazole (ABILIFY) 10 MG tablet, Take 1 tablet (10 mg total) by mouth daily., Disp: 30 tablet, Rfl: 5   atorvastatin (LIPITOR) 40 MG tablet, TAKE 1 TABLET BY MOUTH ONCE DAILY, Disp: 90 tablet, Rfl: 3   b complex vitamins tablet, Take 1 tablet by mouth daily. B12, Disp: , Rfl:    busPIRone (BUSPAR) 30 MG tablet, TAKE 1 TABLET BY MOUTH THREE TIMES DAILY, Disp: 90 tablet, Rfl: 5   diclofenac sodium (VOLTAREN) 1 % GEL, Apply topically 4 (four) times daily., Disp: , Rfl:    diltiazem (CARDIZEM CD) 180 MG 24 hr capsule, Take 1 capsule (180 mg total) by mouth daily., Disp: 90 capsule, Rfl: 3   ezetimibe (ZETIA) 10 MG tablet, Take 1 tablet (10 mg total) by mouth  daily., Disp: 90 tablet, Rfl: 3   fluticasone (FLONASE) 50 MCG/ACT nasal spray, Place 2 sprays into both nostrils daily., Disp: 16 g, Rfl: 6   furosemide (LASIX) 20 MG tablet, Take 1 tablet (20 mg total) by mouth daily as needed for fluid., Disp: 30 tablet, Rfl: 3   gabapentin (NEURONTIN) 600 MG tablet, 600 mg 3 times daily.  Okay to increase nighttime dose to 1200 mg., Disp: 120 tablet, Rfl: 5   ibuprofen (ADVIL,MOTRIN) 200 MG tablet, Take 200 mg by mouth every 6 (six) hours as needed. For pain, Disp: , Rfl:    leflunomide (ARAVA) 10 MG tablet, Take 10 mg by mouth daily., Disp: , Rfl:    linaclotide (LINZESS) 72 MCG capsule, Take by mouth., Disp: , Rfl:    Multiple Vitamin (MULTIVITAMIN) tablet, Take 1 tablet by mouth daily.  , Disp: , Rfl:    Nutritional Supplements (ESTROVEN ENERGY PO), Take by mouth daily., Disp: , Rfl:    omeprazole (PRILOSEC) 20 MG capsule, Take 20 mg by mouth daily., Disp: , Rfl:    pantoprazole (PROTONIX) 40 MG tablet, Take by mouth., Disp: , Rfl:    sertraline (ZOLOFT) 100 MG tablet, Take 1 tablet (100 mg total) by mouth daily., Disp: 90 tablet, Rfl: 1   topiramate (TOPAMAX) 25 MG tablet, Take 1 tablet (25 mg total) by mouth 2 (two) times daily., Disp: 60 tablet, Rfl: 5   traZODone (  DESYREL) 50 MG tablet, Take 1-2 tablets (50-100 mg total) by mouth at bedtime., Disp: 180 tablet, Rfl: 1   valACYclovir (VALTREX) 1000 MG tablet, TAKE TWO TABLETS BY MOUTH TWICE DAILY FOR 5 DAYS AS NEEDED, Disp: 180 tablet, Rfl: 3   venlafaxine XR (EFFEXOR-XR) 75 MG 24 hr capsule, Take 3 capsules (225 mg total) by mouth daily with breakfast., Disp: 270 capsule, Rfl: 1   XIIDRA 5 % SOLN, Place 1 drop into both eyes daily., Disp: , Rfl:   Review of Systems  Constitutional: Negative.   Respiratory: Negative.   Cardiovascular: Negative.   Gastrointestinal: Negative.   Neurological: Negative.   Psychiatric/Behavioral: Negative.     Social History   Tobacco Use   Smoking  status: Current Every Day Smoker    Packs/day: 1.00    Years: 40.00    Pack years: 40.00   Smokeless tobacco: Never Used  Substance Use Topics   Alcohol use: No    Comment: recovering alcoholic Since 8338       Objective:   BP 126/80 (BP Location: Left Arm, Patient Position: Sitting, Cuff Size: Normal)    Pulse (!) 103    Temp 98.3 F (36.8 C) (Oral)    Resp 16    Wt 135 lb (61.2 kg)    BMI 19.94 kg/m  Vitals:   10/28/18 1226  BP: 126/80  Pulse: (!) 103  Resp: 16  Temp: 98.3 F (36.8 C)  TempSrc: Oral  Weight: 135 lb (61.2 kg)     Physical Exam Vitals signs reviewed.  Constitutional:      General: She is not in acute distress.    Appearance: She is well-developed. She is not diaphoretic.  Neck:     Musculoskeletal: Normal range of motion and neck supple.  Cardiovascular:     Rate and Rhythm: Normal rate and regular rhythm.     Heart sounds: Normal heart sounds. No murmur. No friction rub. No gallop.   Pulmonary:     Effort: Pulmonary effort is normal. No respiratory distress.     Breath sounds: Normal breath sounds. No wheezing or rales.  Psychiatric:        Mood and Affect: Mood normal.        Behavior: Behavior normal.        Thought Content: Thought content normal.        Judgment: Judgment normal.         Assessment & Plan    1. Depression, recurrent ([place]) Improving. Continue Abilify 10mg , Venlafaxine 225mg  daily, Buspar 30mg  TID, and Trazodone 50-100mg  for sleep. I will see her back in 3-4 months.   2. Chronic bilateral low back pain with bilateral sciatica Worsening. Has chronic upper back DDD with radiculopathy, followed by Dr. Holley Raring, pain management. Now with new lower back pain and sciatica type symptoms radiating bilaterally. Will get xray as below and forward to Dr. Holley Raring for his review.  - DG Lumbar Spine Complete; Future     [name], PA-C  [facility]

## 2018-10-28 NOTE — Patient Instructions (Signed)

## 2018-11-02 ENCOUNTER — Ambulatory Visit (HOSPITAL_BASED_OUTPATIENT_CLINIC_OR_DEPARTMENT_OTHER): Payer: Medicare Other | Admitting: Student in an Organized Health Care Education/Training Program

## 2018-11-02 ENCOUNTER — Ambulatory Visit
Admission: RE | Admit: 2018-11-02 | Discharge: 2018-11-02 | Disposition: A | Discharge status: Home / Self Care | Payer: Medicare Other | Source: Ambulatory Visit | Attending: Student in an Organized Health Care Education/Training Program | Admitting: Student in an Organized Health Care Education/Training Program

## 2018-11-02 ENCOUNTER — Other Ambulatory Visit: Payer: Self-pay

## 2018-11-02 ENCOUNTER — Encounter: Payer: Self-pay | Admitting: Student in an Organized Health Care Education/Training Program

## 2018-11-02 VITALS — BP 144/91 | HR 79 | Temp 98.0°F | Resp 20 | Ht 69.0 in | Wt 135.0 lb

## 2018-11-02 DIAGNOSIS — M47812 Spondylosis without myelopathy or radiculopathy, cervical region: Secondary | ICD-10-CM

## 2018-11-02 DIAGNOSIS — M5416 Radiculopathy, lumbar region: Secondary | ICD-10-CM

## 2018-11-02 MED ORDER — LIDOCAINE HCL 2 % IJ SOLN
20.0000 mL | Freq: Once | INTRAMUSCULAR | Status: AC
  Administered 2018-11-02: 400 mg
  Filled 2018-11-02: qty 20

## 2018-11-02 MED ORDER — DEXAMETHASONE SODIUM PHOSPHATE 10 MG/ML IJ SOLN
20.0000 mg | Freq: Once | INTRAMUSCULAR | Status: AC
  Administered 2018-11-02: 10 mg
  Filled 2018-11-02: qty 2

## 2018-11-02 MED ORDER — ROPIVACAINE HCL 2 MG/ML IJ SOLN
9.0000 mL | Freq: Once | INTRAMUSCULAR | Status: AC
  Administered 2018-11-02: 10 mL via PERINEURAL
  Filled 2018-11-02: qty 10

## 2018-11-02 MED ORDER — FENTANYL CITRATE (PF) 100 MCG/2ML IJ SOLN
25.0000 ug | INTRAMUSCULAR | Status: DC | PRN
  Administered 2018-11-02: 75 ug via INTRAVENOUS
  Filled 2018-11-02: qty 2

## 2018-11-02 NOTE — Progress Notes (Signed)
Safety precautions to be maintained throughout the outpatient stay will include: orient to surroundings, keep bed in low position, maintain call bell within reach at all times, provide assistance with transfer out of bed and ambulation.  

## 2018-11-02 NOTE — Patient Instructions (Signed)

## 2018-11-02 NOTE — Progress Notes (Signed)
For for patient's Name: Gabrielle Pineda  MRN: [id number]  Referring Provider: [name], [place]*  DOB: Aug 31, 1955  PCP: [name], PA-C  DOS: 11/02/2018  Note by: [name], MD  Service setting: Ambulatory outpatient  Specialty: Interventional Pain Management  Patient type: Established  Location: [facility] (AMB) Pain Management Facility  Visit type: Interventional Procedure   Primary Reason for Visit: Interventional Pain Management Treatment. JJ:HERD pain  Procedure:       Anesthesia, Analgesia, Anxiolysis:  Type: Cervical Facet Medial Branch Block(s) #1 (2020)  Primary Purpose: Diagnostic Region: Posterolateral cervical spine Level: C4, C5, C6, & C7 Medial Branch Level(s). Injecting these levels blocks the C4-5, C5-6, and C6-7 cervical facet joints. Laterality: Right-Sided Paraspinal  Type: Moderate (Conscious) Sedation combined with Local Anesthesia Indication(s): Analgesia and Anxiety Route: Intravenous (IV) IV Access: Secured Sedation: Meaningful verbal contact was maintained at all times during the procedure  Local Anesthetic: Lidocaine 1-2%   Indications: 1. Spondylosis of cervical region without myelopathy or radiculopathy   2. Lumbar radiculopathy (R>L)    Pain Score: Pre-procedure: 9 /10 Post-procedure: 0-No pain/10  Pre-op Assessment:  Gabrielle Pineda is a 63 y.o. (year old), female patient, seen today for interventional treatment. She  has a past surgical history that includes neck disc surgery; Breast enhancement surgery (1981); Esophagogastroduodenoscopy; Tonsillectomy and adenoidectomy (1973 ); Carpal tunnel release (11/11/2011); Carpal tunnel release (2013); Rotator cuff repair (06/16/2016); Rotator cuff repair; Colonoscopy with propofol (N/A, 11/01/2017); and Augmentation mammaplasty (Bilateral, 1982). Gabrielle Pineda has a current medication list which includes the following prescription(s): acyclovir cream, anoro ellipta, aripiprazole, atorvastatin, b complex  vitamins, buspirone, diclofenac sodium, diltiazem, ezetimibe, fluticasone, furosemide, gabapentin, ibuprofen, leflunomide, linaclotide, multivitamin, misc natural products, omeprazole, pantoprazole, sertraline, topiramate, trazodone, valacyclovir, venlafaxine xr, and xiidra, and the following Facility-Administered Medications: fentanyl. Her primarily concern today is the Procedure (Right side cervical facet ) and Neck Pain  Initial Vital Signs:  Pulse/HCG Rate: 82  Temp: 98 F (36.7 C) Resp: 18 BP: 132/90 SpO2: 100 %  BMI: Estimated body mass index is 19.94 kg/m as calculated from the following:   Height as of this encounter: 5\' 9"  (1.753 m).   Weight as of this encounter: 135 lb (61.2 kg).  Risk Assessment: Allergies: Reviewed. She is allergic to plaquenil [hydroxychloroquine].  Allergy Precautions: None required Coagulopathies: Reviewed. None identified.  Blood-thinner therapy: None at this time Active Infection(s): Reviewed. None identified. Gabrielle Pineda is afebrile  Site Confirmation: Gabrielle Pineda was asked to confirm the procedure and laterality before marking the site Procedure checklist: Completed Consent: Before the procedure and under the influence of no sedative(s), amnesic(s), or anxiolytics, the patient was informed of the treatment options, risks and possible complications. To fulfill our ethical and legal obligations, as recommended by the American Medical Association's Code of Ethics, I have informed the patient of my clinical impression; the nature and purpose of the treatment or procedure; the risks, benefits, and possible complications of the intervention; the alternatives, including doing nothing; the risk(s) and benefit(s) of the alternative treatment(s) or procedure(s); and the risk(s) and benefit(s) of doing nothing. The patient was provided information about the general risks and possible complications associated with the procedure. These may include, but are not  limited to: failure to achieve desired goals, infection, bleeding, organ or nerve damage, allergic reactions, paralysis, and death. In addition, the patient was informed of those risks and complications associated to Spine-related procedures, such as failure to decrease pain; infection (i.e.: Meningitis, epidural or intraspinal abscess); bleeding (  i.e.: epidural hematoma, subarachnoid hemorrhage, or any other type of intraspinal or peri-dural bleeding); organ or nerve damage (i.e.: Any type of peripheral nerve, nerve root, or spinal cord injury) with subsequent damage to sensory, motor, and/or autonomic systems, resulting in permanent pain, numbness, and/or weakness of one or several areas of the body; allergic reactions; (i.e.: anaphylactic reaction); and/or death. Furthermore, the patient was informed of those risks and complications associated with the medications. These include, but are not limited to: allergic reactions (i.e.: anaphylactic or anaphylactoid reaction(s)); adrenal axis suppression; blood sugar elevation that in diabetics may result in ketoacidosis or comma; water retention that in patients with history of congestive heart failure may result in shortness of breath, pulmonary edema, and decompensation with resultant heart failure; weight gain; swelling or edema; medication-induced neural toxicity; particulate matter embolism and blood vessel occlusion with resultant organ, and/or nervous system infarction; and/or aseptic necrosis of one or more joints. Finally, the patient was informed that Medicine is not an exact science; therefore, there is also the possibility of unforeseen or unpredictable risks and/or possible complications that may result in a catastrophic outcome. The patient indicated having understood very clearly. We have given the patient no guarantees and we have made no promises. Enough time was given to the patient to ask questions, all of which were answered to the patient's  satisfaction. Gabrielle Pineda has indicated that she wanted to continue with the procedure. Attestation: I, the ordering provider, attest that I have discussed with the patient the benefits, risks, side-effects, alternatives, likelihood of achieving goals, and potential problems during recovery for the procedure that I have provided informed consent. Date   Time:   Pre-Procedure Preparation:  Monitoring: As per clinic protocol. Respiration, ETCO2, SpO2, BP, heart rate and rhythm monitor placed and checked for adequate function Safety Precautions: Patient was assessed for positional comfort and pressure points before starting the procedure. Time-out: I initiated and conducted the "Time-out" before starting the procedure, as per protocol. The patient was asked to participate by confirming the accuracy of the "Time Out" information. Verification of the correct person, site, and procedure were performed and confirmed by me, the nursing staff, and the patient. "Time-out" conducted as per Joint Commission's Universal Protocol (UP.01.01.01). Time: 1121  Description of Procedure:       Position: Prone with head of the table raised to facilitate breathing. Laterality: Right Level: C4, C5, C6, & C7 Medial Branch Level(s). Area Prepped: Posterior Cervico-thoracic Region Prepping solution: ChloraPrep (2% chlorhexidine gluconate and 70% isopropyl alcohol) Safety Precautions: Aspiration looking for blood return was conducted prior to all injections. At no point did we inject any substances, as a needle was being advanced. Before injecting, the patient was told to immediately notify me if she was experiencing any new onset of "ringing in the ears, or metallic taste in the mouth". No attempts were made at seeking any paresthesias. Safe injection practices and needle disposal techniques used. Medications properly checked for expiration dates. SDV (single dose vial) medications used. After the completion of the  procedure, all disposable equipment used was discarded in the proper designated medical waste containers. Local Anesthesia: Protocol guidelines were followed. The patient was positioned over the fluoroscopy table. The area was prepped in the usual manner. The time-out was completed. The target area was identified using fluoroscopy. A 12-in long, straight, sterile hemostat was used with fluoroscopic guidance to locate the targets for each level blocked. Once located, the skin was marked with an approved surgical skin marker. Once all  sites were marked, the skin (epidermis, dermis, and hypodermis), as well as deeper tissues (fat, connective tissue and muscle) were infiltrated with a small amount of a short-acting local anesthetic, loaded on a 10cc syringe with a 25G, 1.5-in  Needle. An appropriate amount of time was allowed for local anesthetics to take effect before proceeding to the next step. Local Anesthetic: Lidocaine 2.0% The unused portion of the local anesthetic was discarded in the proper designated containers. Technical explanation of process:   C4 Medial Branch Nerve Block (MBB): The target area for the C4 dorsal medial articular branch is the lateral concave waist of the articular pillar of C4. Under fluoroscopic guidance, a Quincke needle was inserted until contact was made with os over the postero-lateral aspect of the articular pillar of C4 (target area). After negative aspiration for blood, 84mL of the nerve block solution was injected without difficulty or complication. The needle was removed intact. C5 Medial Branch Nerve Block (MBB): The target area for the C5 dorsal medial articular branch is the lateral concave waist of the articular pillar of C5. Under fluoroscopic guidance, a Quincke needle was inserted until contact was made with os over the postero-lateral aspect of the articular pillar of C5 (target area). After negative aspiration for blood, 1 mL of the nerve block solution was injected  without difficulty or complication. The needle was removed intact. C6 Medial Branch Nerve Block (MBB): The target area for the C6 dorsal medial articular branch is the lateral concave waist of the articular pillar of C6. Under fluoroscopic guidance, a Quincke needle was inserted until contact was made with os over the postero-lateral aspect of the articular pillar of C6 (target area). After negative aspiration for blood, 48mL of the nerve block solution was injected without difficulty or complication. The needle was removed intact. C7 Medial Branch Nerve Block (MBB): The target for the C7 dorsal medial articular branch lies on the superior-medial tip of the C7 transverse process. Under fluoroscopic guidance, a Quincke needle was inserted until contact was made with os over the postero-lateral aspect of the articular pillar of C7 (target area). After negative aspiration for blood, 50mL of the nerve block solution was injected without difficulty or complication. The needle was removed intact.  Procedural Needles: 22-gauge, 3.5-inch, Quincke needles used for all levels. Nerve block solution: 5 cc solution made of 4 cc of 0.2% ropivacaine, 1 cc of Decadron 10 mg/cc.  1 cc injected at each level above.  The unused portion of the solution was discarded in the proper designated containers.  Once the entire procedure was completed, the treated area was cleaned, making sure to leave some of the prepping solution back to take advantage of its long term bactericidal properties.  Vitals:   11/02/18 1135 11/02/18 1145 11/02/18 1155 11/02/18 1205  BP: (!) 151/88 (!) 145/95 139/79 (!) 144/91  Pulse: 83 78 78 79  Resp: (!) 31 19 (!) 21 20  Temp:  98.3 F (36.8 C)  98 F (36.7 C)  TempSrc:  Temporal    SpO2: 99% 99% 98% 98%  Weight:      Height:        Start Time: 1121 hrs. End Time: 1135 hrs.  Imaging Guidance (Spinal):  Type of Imaging Technique: Fluoroscopy Guidance (Spinal) Indication(s): Assistance in  needle guidance and placement for procedures requiring needle placement in or near specific anatomical locations not easily accessible without such assistance. Exposure Time: Please see nurses notes. Contrast: None used. Fluoroscopic Guidance: I was personally  present during the use of fluoroscopy. "Tunnel Vision Technique" used to obtain the best possible view of the target area. Parallax error corrected before commencing the procedure. "Direction-depth-direction" technique used to introduce the needle under continuous pulsed fluoroscopy. Once target was reached, antero-posterior, oblique, and lateral fluoroscopic projection used confirm needle placement in all planes. Images permanently stored in EMR. Interpretation: No contrast injected. I personally interpreted the imaging intraoperatively. Adequate needle placement confirmed in multiple planes. Permanent images saved into the patient's record.  Antibiotic Prophylaxis:   Anti-infectives (From admission, onward)   None     Indication(s): None identified  Post-operative Assessment:  Post-procedure Vital Signs:  Pulse/HCG Rate: 79  Temp: 98 F (36.7 C) Resp: 20 BP: (!) 144/91 SpO2: 98 %  EBL: None  Complications: After the procedure, upon transitioning from prone to sitting, patient developed a headache.  She was monitored in the recovery room.  She stated that her headache was improving at the time of discharge.  No nausea.  Endorses improvement in her right-sided neck pain.  Patient does have a history of chronic migraines.  Informed patient that if she develops any vision changes, if headache worsens or develops nausea vomiting to contact our clinic.  Patient endorsed understanding.  Note: The patient tolerated the entire procedure well. A repeat set of vitals were taken after the procedure and the patient was kept under observation following institutional policy, for this type of procedure. Post-procedural neurological assessment was  performed, showing return to baseline, prior to discharge. The patient was provided with post-procedure discharge instructions, including a section on how to identify potential problems. Should any problems arise concerning this procedure, the patient was given instructions to immediately contact us, at any time, without hesitation. In any case, we plan to contact the patient by telephone for a follow-up status report regarding this interventional procedure.  Comments:  No additional relevant information. 5 out of 5 strength bilateral upper extremity: Shoulder abduction, elbow flexion, elbow extension, thumb extension.  Plan of Care  I also reviewed the patient's recent lumbar spine x-ray which shows lumbar degenerative disc disease and facet arthropathy.  Patient is endorsing low back pain that radiates into bilateral hips and down to her posterior calf in a dermatomal fashion.  We discussed a diagnostic lumbar epidural steroid injection for what appears to be radicular symptoms that she is describing and experiencing.  Risks and benefits of this procedure were discussed and patient would like to proceed.  Order placed for right lumbar epidural steroid injection in 3 to 4 weeks.  Imaging Orders     DG C-Arm 1-60 Min-No Report  Procedure Orders     Lumbar Epidural Injection  Medications ordered for procedure: Meds ordered this encounter  Medications   lidocaine (XYLOCAINE) 2 % (with pres) injection 400 mg   fentaNYL (SUBLIMAZE) injection 25-50 mcg    Make sure Narcan is available in the pyxis when using this medication. In the event of respiratory depression (RR< 8/min): Titrate NARCAN (naloxone) in increments of 0.1 to 0.2 mg IV at 2-3 minute intervals, until desired degree of reversal.   dexamethasone (DECADRON) injection 20 mg   ropivacaine (PF) 2 mg/mL (0.2%) (NAROPIN) injection 9 mL   Medications administered: We administered lidocaine, fentaNYL, dexamethasone, and ropivacaine (PF)  2 mg/mL (0.2%).  See the medical record for exact dosing, route, and time of administration.  New Prescriptions   No medications on file   Disposition: Discharge home  Discharge Date & Time: 11/02/2018; 1220 hrs.  Physician-requested Follow-up: Return in about 4 weeks (around 11/30/2018) for Procedure.  Future Appointments  Date Time Provider [place]  01/26/2019 10:30 AM [name], MD [facility] None  02/09/2019 11:30 AM [name], MD [facility] None   Primary Care Physician: Rubye Beach Location: [facility] Note by: [name], MD Date: 11/02/2018; Time: 12:25 PM  Disclaimer:  Medicine is not an exact science. The only guarantee in medicine is that nothing is guaranteed. It is important to note that the decision to proceed with this intervention was based on the information collected from the patient. The Data and conclusions were drawn from the patient's questionnaire, the interview, and the physical examination. Because the information was provided in large part by the patient, it cannot be guaranteed that it has not been purposely or unconsciously manipulated. Every effort has been made to obtain as much relevant data as possible for this evaluation. It is important to note that the conclusions that lead to this procedure are derived in large part from the available data. Always take into account that the treatment will also be dependent on availability of resources and existing treatment guidelines, considered by other Pain Management Practitioners as being common knowledge and practice, at the time of the intervention. For Medico-Legal purposes, it is also important to point out that variation in procedural techniques and pharmacological choices are the acceptable norm. The indications, contraindications, technique, and results of the above procedure should only be interpreted and judged by a Board-Certified Interventional Pain  Specialist with extensive familiarity and expertise in the same exact procedure and technique.

## 2018-11-03 ENCOUNTER — Telehealth: Payer: Self-pay

## 2018-11-03 NOTE — Telephone Encounter (Signed)
Denies any needs at this time. I asked about her headache and she states that it is much better and that her neck pain is better. Instructed to call if needed.

## 2018-11-07 ENCOUNTER — Encounter: Payer: Self-pay | Admitting: Physician Assistant

## 2018-11-12 ENCOUNTER — Other Ambulatory Visit: Payer: Self-pay | Admitting: Physician Assistant

## 2018-11-12 DIAGNOSIS — R339 Retention of urine, unspecified: Secondary | ICD-10-CM

## 2018-11-23 ENCOUNTER — Other Ambulatory Visit: Payer: Self-pay | Admitting: Physician Assistant

## 2018-11-23 ENCOUNTER — Ambulatory Visit: Payer: BLUE CROSS/BLUE SHIELD | Admitting: Student in an Organized Health Care Education/Training Program

## 2018-11-23 DIAGNOSIS — F5101 Primary insomnia: Secondary | ICD-10-CM

## 2018-11-23 DIAGNOSIS — F339 Major depressive disorder, recurrent, unspecified: Secondary | ICD-10-CM

## 2018-11-30 ENCOUNTER — Ambulatory Visit: Payer: BLUE CROSS/BLUE SHIELD | Admitting: Student in an Organized Health Care Education/Training Program

## 2018-12-06 ENCOUNTER — Other Ambulatory Visit: Payer: Self-pay | Admitting: Physician Assistant

## 2018-12-06 DIAGNOSIS — F321 Major depressive disorder, single episode, moderate: Secondary | ICD-10-CM

## 2018-12-09 ENCOUNTER — Other Ambulatory Visit: Payer: Self-pay | Admitting: Physician Assistant

## 2018-12-12 ENCOUNTER — Other Ambulatory Visit: Payer: Self-pay | Admitting: Physician Assistant

## 2018-12-12 DIAGNOSIS — F321 Major depressive disorder, single episode, moderate: Secondary | ICD-10-CM

## 2018-12-14 ENCOUNTER — Other Ambulatory Visit: Payer: Self-pay | Admitting: *Deleted

## 2018-12-14 DIAGNOSIS — F321 Major depressive disorder, single episode, moderate: Secondary | ICD-10-CM

## 2018-12-15 NOTE — Telephone Encounter (Signed)
I have that therapy was changed to venlafaxine. Can we see what she is taking please?  Thanks!

## 2018-12-15 NOTE — Telephone Encounter (Signed)
I called and spoke with patient. She says that she has not seen therapy or psychiatry for several years. Patient is currently taking Venlafaxine ER 75mg  three times daily, Abilify 10mg  daily and Sertraline 100mg  daily. She says all three medications are prescribed by [facility].

## 2018-12-16 MED ORDER — SERTRALINE HCL 100 MG PO TABS: 100.0000 mg | ORAL_TABLET | Freq: Every day | ORAL | 1 refills | Status: DC

## 2018-12-21 ENCOUNTER — Ambulatory Visit: Payer: BLUE CROSS/BLUE SHIELD | Admitting: Student in an Organized Health Care Education/Training Program

## 2018-12-28 ENCOUNTER — Ambulatory Visit: Payer: BLUE CROSS/BLUE SHIELD | Admitting: Student in an Organized Health Care Education/Training Program

## 2019-01-03 ENCOUNTER — Other Ambulatory Visit: Payer: Self-pay | Admitting: Cardiovascular Disease

## 2019-01-03 DIAGNOSIS — Z79899 Other long term (current) drug therapy: Secondary | ICD-10-CM | POA: Diagnosis not present

## 2019-01-03 DIAGNOSIS — M0579 Rheumatoid arthritis with rheumatoid factor of multiple sites without organ or systems involvement: Secondary | ICD-10-CM | POA: Diagnosis not present

## 2019-01-04 DIAGNOSIS — R1013 Epigastric pain: Secondary | ICD-10-CM | POA: Diagnosis not present

## 2019-01-16 DIAGNOSIS — M545 Low back pain: Secondary | ICD-10-CM | POA: Diagnosis not present

## 2019-01-16 DIAGNOSIS — G8929 Other chronic pain: Secondary | ICD-10-CM | POA: Diagnosis not present

## 2019-01-16 DIAGNOSIS — M0579 Rheumatoid arthritis with rheumatoid factor of multiple sites without organ or systems involvement: Secondary | ICD-10-CM | POA: Diagnosis not present

## 2019-01-16 DIAGNOSIS — Z79899 Other long term (current) drug therapy: Secondary | ICD-10-CM | POA: Diagnosis not present

## 2019-01-16 DIAGNOSIS — M542 Cervicalgia: Secondary | ICD-10-CM | POA: Diagnosis not present

## 2019-01-16 DIAGNOSIS — M8949 Other hypertrophic osteoarthropathy, multiple sites: Secondary | ICD-10-CM | POA: Diagnosis not present

## 2019-01-20 ENCOUNTER — Other Ambulatory Visit
Admission: RE | Admit: 2019-01-20 | Discharge: 2019-01-20 | Disposition: A | Discharge status: Home / Self Care | Payer: Medicare Other | Source: Ambulatory Visit | Attending: Student in an Organized Health Care Education/Training Program | Admitting: Student in an Organized Health Care Education/Training Program

## 2019-01-20 ENCOUNTER — Other Ambulatory Visit: Payer: Self-pay

## 2019-01-20 DIAGNOSIS — Z1159 Encounter for screening for other viral diseases: Secondary | ICD-10-CM | POA: Insufficient documentation

## 2019-01-20 DIAGNOSIS — Z01812 Encounter for preprocedural laboratory examination: Secondary | ICD-10-CM | POA: Diagnosis present

## 2019-01-21 LAB — NOVEL CORONAVIRUS, NAA (HOSP ORDER, SEND-OUT TO REF LAB; TAT 18-24 HRS): SARS-CoV-2, NAA: NOT DETECTED

## 2019-01-25 ENCOUNTER — Ambulatory Visit (HOSPITAL_BASED_OUTPATIENT_CLINIC_OR_DEPARTMENT_OTHER): Payer: BC Managed Care – PPO | Admitting: Student in an Organized Health Care Education/Training Program

## 2019-01-25 ENCOUNTER — Ambulatory Visit
Admission: RE | Admit: 2019-01-25 | Discharge: 2019-01-25 | Disposition: A | Discharge status: Home / Self Care | Payer: BC Managed Care – PPO | Source: Ambulatory Visit | Attending: Student in an Organized Health Care Education/Training Program | Admitting: Student in an Organized Health Care Education/Training Program

## 2019-01-25 ENCOUNTER — Encounter: Payer: Self-pay | Admitting: Student in an Organized Health Care Education/Training Program

## 2019-01-25 ENCOUNTER — Other Ambulatory Visit: Payer: Self-pay

## 2019-01-25 VITALS — BP 132/87 | HR 78 | Temp 98.0°F | Resp 17 | Ht 69.0 in | Wt 140.0 lb

## 2019-01-25 DIAGNOSIS — Z01818 Encounter for other preprocedural examination: Secondary | ICD-10-CM | POA: Insufficient documentation

## 2019-01-25 DIAGNOSIS — M5416 Radiculopathy, lumbar region: Secondary | ICD-10-CM | POA: Diagnosis present

## 2019-01-25 MED ORDER — ROPIVACAINE HCL 2 MG/ML IJ SOLN
2.0000 mL | Freq: Once | INTRAMUSCULAR | Status: AC
  Administered 2019-01-25: 10 mL via EPIDURAL
  Filled 2019-01-25: qty 10

## 2019-01-25 MED ORDER — SODIUM CHLORIDE 0.9% FLUSH
2.0000 mL | Freq: Once | INTRAVENOUS | Status: AC
  Administered 2019-01-25: 10 mL

## 2019-01-25 MED ORDER — IOHEXOL 180 MG/ML  SOLN
10.0000 mL | Freq: Once | INTRAMUSCULAR | Status: AC
  Administered 2019-01-25: 10 mL via EPIDURAL

## 2019-01-25 MED ORDER — LIDOCAINE HCL 2 % IJ SOLN
20.0000 mL | Freq: Once | INTRAMUSCULAR | Status: AC
  Administered 2019-01-25: 11:00:00 400 mg
  Filled 2019-01-25: qty 20

## 2019-01-25 MED ORDER — FENTANYL CITRATE (PF) 100 MCG/2ML IJ SOLN
25.0000 ug | INTRAMUSCULAR | Status: DC | PRN
  Administered 2019-01-25: 75 ug via INTRAVENOUS
  Filled 2019-01-25: qty 2

## 2019-01-25 MED ORDER — DEXAMETHASONE SODIUM PHOSPHATE 10 MG/ML IJ SOLN
10.0000 mg | Freq: Once | INTRAMUSCULAR | Status: AC
  Administered 2019-01-25: 10 mg
  Filled 2019-01-25: qty 1

## 2019-01-25 NOTE — Progress Notes (Signed)
Safety precautions to be maintained throughout the outpatient stay will include: orient to surroundings, keep bed in low position, maintain call bell within reach at all times, provide assistance with transfer out of bed and ambulation.  

## 2019-01-25 NOTE — Progress Notes (Signed)
Patient's Name: Gabrielle Pineda  MRN: [id number]  Referring Provider: [name], [place]*  DOB: 06-Sep-1955  PCP: [name], PA-C  DOS: 01/25/2019  Note by: [name], MD  Service setting: Ambulatory outpatient  Specialty: Interventional Pain Management  Patient type: Established  Location: [facility] (AMB) Pain Management Facility  Visit type: Interventional Procedure   Primary Reason for Visit: Interventional Pain Management Treatment. CC: Low back pain, bilateral leg pain Procedure:          Anesthesia, Analgesia, Anxiolysis:  Type: Diagnostic Inter-Laminar Epidural Steroid Injection  #1  Region: Lumbar Level: L5-S1 Level. Laterality: Midline         Type: Moderate (Conscious) Sedation combined with Local Anesthesia Indication(s): Analgesia and Anxiety Route: Intravenous (IV) IV Access: Secured Sedation: Meaningful verbal contact was maintained at all times during the procedure  Local Anesthetic: Lidocaine 1-2%  Position: Prone with head of the table was raised to facilitate breathing.   Indications: 1. Lumbar radiculopathy (R>L)   2. Preoperative testing    Pain Score: Pre-procedure: 9 /10 Post-procedure: 0-No pain/10  Pre-op Assessment:  Gabrielle Pineda is a 63 y.o. (year old), female patient, seen today for interventional treatment. She  has a past surgical history that includes neck disc surgery; Breast enhancement surgery (1981); Esophagogastroduodenoscopy; Tonsillectomy and adenoidectomy (1973 ); Carpal tunnel release (11/11/2011); Carpal tunnel release (2013); Rotator cuff repair (06/16/2016); Rotator cuff repair; Colonoscopy with propofol (N/A, 11/01/2017); and Augmentation mammaplasty (Bilateral, 1982). Gabrielle Pineda has a current medication list which includes the following prescription(s): acyclovir cream, anoro ellipta, aripiprazole, atorvastatin, b complex vitamins, buspirone, diclofenac sodium, diltiazem, ezetimibe, fluticasone, furosemide, gabapentin,  ibuprofen, leflunomide, multivitamin, misc natural products, omeprazole, pantoprazole, sertraline, topiramate, trazodone, valacyclovir, venlafaxine xr, xiidra, and linaclotide, and the following Facility-Administered Medications: fentanyl. Her primarily concern today is the No chief complaint on file.  Initial Vital Signs:  Pulse/HCG Rate: 78ECG Heart Rate: 65 Temp: 98.4 F (36.9 C) Resp: 14 BP: (!) 133/91 SpO2: 98 %  BMI: Estimated body mass index is 20.67 kg/m as calculated from the following:   Height as of this encounter: 5\' 9"  (1.753 m).   Weight as of this encounter: 140 lb (63.5 kg).  Risk Assessment: Allergies: Reviewed. She is allergic to plaquenil [hydroxychloroquine].  Allergy Precautions: None required Coagulopathies: Reviewed. None identified.  Blood-thinner therapy: None at this time Active Infection(s): Reviewed. None identified. Gabrielle Pineda is afebrile  Site Confirmation: Gabrielle Pineda was asked to confirm the procedure and laterality before marking the site Procedure checklist: Completed Consent: Before the procedure and under the influence of no sedative(s), amnesic(s), or anxiolytics, the patient was informed of the treatment options, risks and possible complications. To fulfill our ethical and legal obligations, as recommended by the American Medical Association's Code of Ethics, I have informed the patient of my clinical impression; the nature and purpose of the treatment or procedure; the risks, benefits, and possible complications of the intervention; the alternatives, including doing nothing; the risk(s) and benefit(s) of the alternative treatment(s) or procedure(s); and the risk(s) and benefit(s) of doing nothing. The patient was provided information about the general risks and possible complications associated with the procedure. These may include, but are not limited to: failure to achieve desired goals, infection, bleeding, organ or nerve damage, allergic  reactions, paralysis, and death. In addition, the patient was informed of those risks and complications associated to Spine-related procedures, such as failure to decrease pain; infection (i.e.: Meningitis, epidural or intraspinal abscess); bleeding (i.e.: epidural hematoma, subarachnoid hemorrhage, or  any other type of intraspinal or peri-dural bleeding); organ or nerve damage (i.e.: Any type of peripheral nerve, nerve root, or spinal cord injury) with subsequent damage to sensory, motor, and/or autonomic systems, resulting in permanent pain, numbness, and/or weakness of one or several areas of the body; allergic reactions; (i.e.: anaphylactic reaction); and/or death. Furthermore, the patient was informed of those risks and complications associated with the medications. These include, but are not limited to: allergic reactions (i.e.: anaphylactic or anaphylactoid reaction(s)); adrenal axis suppression; blood sugar elevation that in diabetics may result in ketoacidosis or comma; water retention that in patients with history of congestive heart failure may result in shortness of breath, pulmonary edema, and decompensation with resultant heart failure; weight gain; swelling or edema; medication-induced neural toxicity; particulate matter embolism and blood vessel occlusion with resultant organ, and/or nervous system infarction; and/or aseptic necrosis of one or more joints. Finally, the patient was informed that Medicine is not an exact science; therefore, there is also the possibility of unforeseen or unpredictable risks and/or possible complications that may result in a catastrophic outcome. The patient indicated having understood very clearly. We have given the patient no guarantees and we have made no promises. Enough time was given to the patient to ask questions, all of which were answered to the patient's satisfaction. Gabrielle Pineda has indicated that she wanted to continue with the procedure. Attestation:  I, the ordering provider, attest that I have discussed with the patient the benefits, risks, side-effects, alternatives, likelihood of achieving goals, and potential problems during recovery for the procedure that I have provided informed consent. Date  Time: 01/25/2019  9:34 AM  Pre-Procedure Preparation:  Monitoring: As per clinic protocol. Respiration, ETCO2, SpO2, BP, heart rate and rhythm monitor placed and checked for adequate function Safety Precautions: Patient was assessed for positional comfort and pressure points before starting the procedure. Time-out: I initiated and conducted the "Time-out" before starting the procedure, as per protocol. The patient was asked to participate by confirming the accuracy of the "Time Out" information. Verification of the correct person, site, and procedure were performed and confirmed by me, the nursing staff, and the patient. "Time-out" conducted as per Joint Commission's Universal Protocol (UP.01.01.01). Time: 1113  Description of Procedure:          Target Area: The interlaminar space, initially targeting the lower laminar border of the superior vertebral body. Approach: Paramedial approach. Area Prepped: Entire Posterior Lumbar Region Prepping solution: DuraPrep (Iodine Povacrylex [0.7% available iodine] and Isopropyl Alcohol, 74% w/w) Safety Precautions: Aspiration looking for blood return was conducted prior to all injections. At no point did we inject any substances, as a needle was being advanced. No attempts were made at seeking any paresthesias. Safe injection practices and needle disposal techniques used. Medications properly checked for expiration dates. SDV (single dose vial) medications used. Description of the Procedure: Protocol guidelines were followed. The procedure needle was introduced through the skin, ipsilateral to the reported pain, and advanced to the target area. Bone was contacted and the needle walked caudad, until the lamina was  cleared. The epidural space was identified using "loss-of-resistance technique" with 2-3 ml of PF-NaCl (0.9% NSS), in a 5cc LOR glass syringe.  Vitals:   01/25/19 1133 01/25/19 1143 01/25/19 1153 01/25/19 1203  BP: 137/75 129/77 133/64 132/87  Pulse:      Resp:  19 (!) 23 17  Temp:  98.1 F (36.7 C)  98 F (36.7 C)  TempSrc:  Temporal  Temporal  SpO2: 98%  99% 99% 100%  Weight:      Height:        Start Time: 1115 hrs. End Time: 1131 hrs.  Materials:  Needle(s) Type: Epidural needle Gauge: 17G Length: 3.5-in Medication(s): Please see orders for medications and dosing details. 8 cc solution made of 5 cc of preservative-free saline, 2 cc of 0.2% ropivacaine, 1 cc of Decadron 10 mg/cc. Imaging Guidance (Spinal):          Type of Imaging Technique: Fluoroscopy Guidance (Spinal) Indication(s): Assistance in needle guidance and placement for procedures requiring needle placement in or near specific anatomical locations not easily accessible without such assistance. Exposure Time: Please see nurses notes. Contrast: Before injecting any contrast, we confirmed that the patient did not have an allergy to iodine, shellfish, or radiological contrast. Once satisfactory needle placement was completed at the desired level, radiological contrast was injected. Contrast injected under live fluoroscopy. No contrast complications. See chart for type and volume of contrast used. Fluoroscopic Guidance: I was personally present during the use of fluoroscopy. "Tunnel Vision Technique" used to obtain the best possible view of the target area. Parallax error corrected before commencing the procedure. "Direction-depth-direction" technique used to introduce the needle under continuous pulsed fluoroscopy. Once target was reached, antero-posterior, oblique, and lateral fluoroscopic projection used confirm needle placement in all planes. Images permanently stored in EMR. Interpretation: I personally interpreted the  imaging intraoperatively. Adequate needle placement confirmed in multiple planes. Appropriate spread of contrast into desired area was observed. No evidence of afferent or efferent intravascular uptake. No intrathecal or subarachnoid spread observed. Permanent images saved into the patient's record.  Antibiotic Prophylaxis:   Anti-infectives (From admission, onward)   None     Indication(s): None identified  Post-operative Assessment:  Post-procedure Vital Signs:  Pulse/HCG Rate: 7871 Temp: 98 F (36.7 C) Resp: 17 BP: 132/87 SpO2: 100 %  EBL: None  Complications: No immediate post-treatment complications observed by team, or reported by patient.  Note: The patient tolerated the entire procedure well. A repeat set of vitals were taken after the procedure and the patient was kept under observation following institutional policy, for this type of procedure. Post-procedural neurological assessment was performed, showing return to baseline, prior to discharge. The patient was provided with post-procedure discharge instructions, including a section on how to identify potential problems. Should any problems arise concerning this procedure, the patient was given instructions to immediately contact us, at any time, without hesitation. In any case, we plan to contact the patient by telephone for a follow-up status report regarding this interventional procedure.  Comments:  No additional relevant information. 5 out of 5 strength bilateral lower extremity: Plantar flexion, dorsiflexion, knee flexion, knee extension.  Plan of Care  Orders:  Orders Placed This Encounter  Procedures  . Novel Coronavirus, NAA ([facility])    Send patient to pre-admission testing for collection. Estimated turn-around time: 72 hrs.    Standing Status:   Future    Standing Expiration Date:   03/14/2019  . [facility] C-ARM 1-60 MIN NO REPORT    Intraoperative interpretation by procedural physician at [place].    Standing Status:   Standing    Number of Occurrences:   1    Order Specific Question:   Reason for exam:    Answer:   Assistance in needle guidance and placement for procedures requiring needle placement in or near specific anatomical locations not easily accessible without such assistance.   Medications ordered for procedure: Meds  ordered this encounter  Medications  . lidocaine (XYLOCAINE) 2 % (with pres) injection 400 mg  . iohexol (OMNIPAQUE) 180 MG/ML injection 10 mL    Must be Myelogram-compatible. If not available, you may substitute with a water-soluble, non-ionic, hypoallergenic, myelogram-compatible radiological contrast medium.  . sodium chloride flush (NS) 0.9 % injection 2 mL  . ropivacaine (PF) 2 mg/mL (0.2%) (NAROPIN) injection 2 mL  . dexamethasone (DECADRON) injection 10 mg  . fentaNYL (SUBLIMAZE) injection 25-50 mcg    Make sure Narcan is available in the pyxis when using this medication. In the event of respiratory depression (RR< 8/min): Titrate NARCAN (naloxone) in increments of 0.1 to 0.2 mg IV at 2-3 minute intervals, until desired degree of reversal.   Medications administered: We administered lidocaine, iohexol, sodium chloride flush, ropivacaine (PF) 2 mg/mL (0.2%), dexamethasone, and fentaNYL.  See the medical record for exact dosing, route, and time of administration.  Disposition: Discharge home  Discharge Date & Time: 01/25/2019; 1207 hrs.   Follow-up plan:   Return in about 5 weeks (around 03/01/2019) for Post Procedure Evaluation.     Future Appointments  Date Time Provider [place]  02/09/2019 11:30 AM [name], MD [facility] None  03/02/2019  8:45 AM [name], MD [facility] None   Primary Care Physician: Rubye Beach Location: [facility] Outpatient Pain Management Facility Note by: [name], MD Date: 01/25/2019; Time: 3:46 PM  Disclaimer:  Medicine is not an exact science. The only guarantee in medicine is  that nothing is guaranteed. It is important to note that the decision to proceed with this intervention was based on the information collected from the patient. The Data and conclusions were drawn from the patient's questionnaire, the interview, and the physical examination. Because the information was provided in large part by the patient, it cannot be guaranteed that it has not been purposely or unconsciously manipulated. Every effort has been made to obtain as much relevant data as possible for this evaluation. It is important to note that the conclusions that lead to this procedure are derived in large part from the available data. Always take into account that the treatment will also be dependent on availability of resources and existing treatment guidelines, considered by other Pain Management Practitioners as being common knowledge and practice, at the time of the intervention. For Medico-Legal purposes, it is also important to point out that variation in procedural techniques and pharmacological choices are the acceptable norm. The indications, contraindications, technique, and results of the above procedure should only be interpreted and judged by a Board-Certified Interventional Pain Specialist with extensive familiarity and expertise in the same exact procedure and technique.

## 2019-01-25 NOTE — Patient Instructions (Signed)

## 2019-01-26 ENCOUNTER — Ambulatory Visit: Payer: BLUE CROSS/BLUE SHIELD | Admitting: Student in an Organized Health Care Education/Training Program

## 2019-01-26 ENCOUNTER — Telehealth: Payer: Self-pay | Admitting: *Deleted

## 2019-01-26 NOTE — Telephone Encounter (Signed)
No problems post procedure. 

## 2019-01-30 DIAGNOSIS — Z800 Family history of malignant neoplasm of digestive organs: Secondary | ICD-10-CM | POA: Diagnosis not present

## 2019-01-30 DIAGNOSIS — R1011 Right upper quadrant pain: Secondary | ICD-10-CM | POA: Diagnosis not present

## 2019-01-30 DIAGNOSIS — Z1211 Encounter for screening for malignant neoplasm of colon: Secondary | ICD-10-CM | POA: Diagnosis not present

## 2019-01-30 DIAGNOSIS — K5900 Constipation, unspecified: Secondary | ICD-10-CM | POA: Diagnosis not present

## 2019-02-09 ENCOUNTER — Encounter: Payer: BLUE CROSS/BLUE SHIELD | Admitting: Student in an Organized Health Care Education/Training Program

## 2019-02-09 ENCOUNTER — Telehealth: Payer: Self-pay | Admitting: Physician Assistant

## 2019-02-09 NOTE — Telephone Encounter (Signed)
Dr. Albertine Patricia, of [facility] [phone]  Is looking over a disability claim and some questions.  Regarding Restriction Form in March.  Please call Dr. Levy Sjogren back.  Thanks, [employer]

## 2019-02-09 NOTE — Telephone Encounter (Signed)
Please Review

## 2019-02-10 NOTE — Telephone Encounter (Signed)
Spoke with Dr. Levy Sjogren and he advised to change restrictions that there is no lifting, pulling, etc for upper body. For lower body it should be documented that she could have a sedentary job that requires/would allow frequent position changes of sitting/standing and walking as needed.

## 2019-02-13 ENCOUNTER — Other Ambulatory Visit: Payer: Self-pay | Admitting: Physician Assistant

## 2019-02-13 DIAGNOSIS — Z1231 Encounter for screening mammogram for malignant neoplasm of breast: Secondary | ICD-10-CM

## 2019-02-15 ENCOUNTER — Other Ambulatory Visit: Payer: Self-pay | Admitting: Physician Assistant

## 2019-02-15 DIAGNOSIS — F5101 Primary insomnia: Secondary | ICD-10-CM

## 2019-02-22 ENCOUNTER — Encounter: Payer: Self-pay | Admitting: Student in an Organized Health Care Education/Training Program

## 2019-02-22 ENCOUNTER — Other Ambulatory Visit: Payer: Self-pay | Admitting: Physician Assistant

## 2019-02-22 DIAGNOSIS — R339 Retention of urine, unspecified: Secondary | ICD-10-CM

## 2019-02-23 ENCOUNTER — Ambulatory Visit
Payer: Medicare Other | Attending: Student in an Organized Health Care Education/Training Program | Admitting: Student in an Organized Health Care Education/Training Program

## 2019-02-23 ENCOUNTER — Telehealth: Payer: Self-pay | Admitting: Student in an Organized Health Care Education/Training Program

## 2019-02-23 ENCOUNTER — Other Ambulatory Visit: Payer: Self-pay

## 2019-02-23 ENCOUNTER — Encounter: Payer: Self-pay | Admitting: Student in an Organized Health Care Education/Training Program

## 2019-02-23 VITALS — BP 127/86 | HR 86 | Temp 98.1°F | Resp 16 | Ht 69.0 in | Wt 140.0 lb

## 2019-02-23 DIAGNOSIS — M5136 Other intervertebral disc degeneration, lumbar region: Secondary | ICD-10-CM | POA: Insufficient documentation

## 2019-02-23 DIAGNOSIS — M47816 Spondylosis without myelopathy or radiculopathy, lumbar region: Secondary | ICD-10-CM | POA: Insufficient documentation

## 2019-02-23 DIAGNOSIS — M5416 Radiculopathy, lumbar region: Secondary | ICD-10-CM | POA: Diagnosis present

## 2019-02-23 DIAGNOSIS — G894 Chronic pain syndrome: Secondary | ICD-10-CM | POA: Diagnosis present

## 2019-02-23 DIAGNOSIS — G8929 Other chronic pain: Secondary | ICD-10-CM | POA: Diagnosis present

## 2019-02-23 DIAGNOSIS — M48062 Spinal stenosis, lumbar region with neurogenic claudication: Secondary | ICD-10-CM | POA: Diagnosis present

## 2019-02-23 MED ORDER — METHYLPREDNISOLONE 4 MG PO TBPK: ORAL_TABLET | ORAL | 0 refills | Status: AC

## 2019-02-23 NOTE — Progress Notes (Signed)
Safety precautions to be maintained throughout the outpatient stay will include: orient to surroundings, keep bed in low position, maintain call bell within reach at all times, provide assistance with transfer out of bed and ambulation.  

## 2019-02-23 NOTE — Telephone Encounter (Signed)
[employer] did not approve MRI   You will need to do a peer to peer  Have it scheduled on Monday at 12:30   Call  [facility] [phone]   Reference : sean 02-23-19 1:17 CST  IF any questions call Juliann Pulse

## 2019-02-23 NOTE — Progress Notes (Signed)
Patient's Name: Gabrielle Pineda  MRN: [id number]  Referring Provider: [name], [place]*  DOB: 05/26/1956  PCP: [name], PA-C  DOS: 02/23/2019  Note by: [name], MD  Service setting: Ambulatory outpatient  Attending: [name], MD  Location: [facility] (AMB) Pain Management Facility  Specialty: Interventional Pain Management  Patient type: Established   Primary Reason(s) for Visit: Encounter for post-procedure evaluation of chronic illness with mild to moderate exacerbation CC: Back Pain (lower)  HPI  Gabrielle Pineda is a 63 y.o. year old, female patient, who comes today for a post-procedure evaluation. She has Hyperlipidemia; SMOKER; Paroxysmal supraventricular tachycardia ([place]); CHEST PAIN UNSPECIFIED; ELECTROCARDIOGRAM, ABNORMAL; Shortness of breath; Closed fracture of distal phalanx of thumb; Acid reflux; Arthritis, degenerative; Arthritis or polyarthritis, rheumatoid ([place]); B-complex deficiency; CN (constipation); Clinical depression; Family history of malignant neoplasm of pancreas; Cold sore; H/O alcohol abuse; Hypercholesteremia; Disorder of iron metabolism; Cannot sleep; Localized osteoarthrosis, hand; Menopausal symptom; Weight loss; Delayed onset of urination; COPD (chronic obstructive pulmonary disease) ([place]); Closed compression fracture of L5 lumbar vertebra; Sacral insufficiency fracture with routine healing; Essential hypertension; Cervical radiculopathy; Neuropathy; and History of adenomatous polyp of colon on their problem list. Her primarily concern today is the Back Pain (lower)  Pain Assessment: Location: Lower Back Radiating: down sides of both legs to knees Onset: More than a month ago Duration: Chronic pain Quality: Aching, Constant, Shooting, Throbbing Severity: 10-Worst pain ever/10 (subjective, self-reported pain score)  Note: Reported level is inconsistent with clinical observations.                         When using our objective Pain Scale, levels  between 6 and 10/10 are said to belong in an emergency room, as it progressively worsens from a 6/10, described as severely limiting, requiring emergency care not usually available at an outpatient pain management facility. At a 6/10 level, communication becomes difficult and requires great effort. Assistance to reach the emergency department may be required. Facial flushing and profuse sweating along with potentially dangerous increases in heart rate and blood pressure will be evident. Effect on ADL: limits daily activities Timing: Constant Modifying factors: nothing helps BP: 127/86  HR: 86  Gabrielle Pineda comes in today for post-procedure evaluation.  Further details on both, my assessment(s), as well as the proposed treatment plan, please see below.  Post-Procedure Assessment  01/25/2019 Procedure: L5-S1 ESI 1 Pre-procedure pain score:  9/10 Post-procedure pain score: 0/10         Influential Factors: BMI: 20.67 kg/m Intra-procedural challenges: None observed.         Assessment challenges: None detected.              Reported side-effects: None.        Post-procedural adverse reactions or complications: None reported         Initial Analgesic Effects (1st post-procedure hour): 100 %            Sedation: Please see nurses note. When none is used, analgesia during this period is strictly due to the local anesthetic. When sedation is administered, analgesia may be a combination of the IV analgesic/anxiolytic, plus the effect of the local anesthetics used. Interpretative annotation: Clinically appropriate result. Analgesia during this period is likely to be Local Anesthetic and/or IV Sedative (Analgesic/Anxiolytic) related.          Persistent Analgesic Response (subsequent 4-6 hours post-procedure): 100 %  Analgesic effects during this period is associated to the localized infiltration of local anesthetics and therefore caries significant diagnostic value as to the etiological  location, or anatomical origin, of the pain. Expected duration of relief is directly dependent on the pharmacodynamics of the local anesthetic used. Long-acting (4-6 hours) anesthetics used.  Interpretative annotation: Clinically appropriate result. Analgesia during this period is likely to be Local Anesthetic-related.          Analgesic Response past initial 6 hours post-procedure: 100 %(pain begain to return on the second day progressing steadily no" long tem relief")           Defined as the period of time past the expected duration of local anesthetics (1 hour for short-acting and 4-6 hours for long-acting). With the possible exception of prolonged sympathetic blockade from the local anesthetics, benefits during this period are typically attributed to, or associated with, other factors such as analgesic sensory neuropraxia, antiinflammatory effects, or beneficial biochemical changes provided by agents other than the local anesthetics.  Interpretative annotation: Clinically possible results. No long-term benefit. No permanent benefit expected. Inflammation plays a part in the etiology to the pain.          Current benefits: Defined as reported results that persistent at this point in time.   Analgesia: 0 %            Function: No benefit ROM: No benefit Interpretative annotation: Recurrence of symptoms. No permanent benefit expected. Results would argue against repeating therapy.          Interpretation: Results would suggest therapy to have a limited impact on the patient's condition.                  Plan:  Please see "Plan of Care" for details.                Laboratory Chemistry   SAFETY SCREENING Profile Lab Results  Component Value Date   SARSCOV2NAA NOT DETECTED 01/20/2019   COVIDSOURCE NASOPHARYNGEAL 01/20/2019   STAPHAUREUS NEGATIVE 11/11/2011   MRSAPCR NEGATIVE 11/11/2011    Inflammation Markers (CRP: Acute Phase) (ESR: Chronic Phase) No results found for: CRP, ESRSEDRATE,  LATICACIDVEN                       Renal Markers Lab Results  Component Value Date   BUN 5 (L) 04/14/2018   CREATININE 0.71 04/14/2018   BCR 7 (L) 04/14/2018   GFRAA 106 04/14/2018   GFRNONAA 92 04/14/2018                             Hepatic Markers Lab Results  Component Value Date   AST 20 04/14/2018   ALT 14 04/14/2018   ALBUMIN 4.2 04/14/2018                        Note: Lab results reviewed.  Recent Imaging Results   Results for orders placed in visit on 11/02/18  DG C-Arm 1-60 Min-No Report   Narrative Fluoroscopy was utilized by the requesting physician.  No radiographic  interpretation.    Interpretation Report: Fluoroscopy was used during the procedure to assist with needle guidance. The images were interpreted intraoperatively by the requesting physician.        Meds   Current Outpatient Medications:  .  acyclovir cream (ZOVIRAX) 5 %, Apply 1 application topically every 4 (four) hours., Disp: 15  g, Rfl: 0 .  ANORO ELLIPTA 62.5-25 MCG/INH AEPB, , Disp: , Rfl:  .  ARIPiprazole (ABILIFY) 10 MG tablet, Take 1 tablet (10 mg total) by mouth daily., Disp: 30 tablet, Rfl: 5 .  atorvastatin (LIPITOR) 40 MG tablet, Take 1 tablet by mouth once daily, Disp: 90 tablet, Rfl: 0 .  b complex vitamins tablet, Take 1 tablet by mouth daily. B12, Disp: , Rfl:  .  busPIRone (BUSPAR) 30 MG tablet, TAKE 1 TABLET BY MOUTH THREE TIMES DAILY, Disp: 90 tablet, Rfl: 5 .  diclofenac sodium (VOLTAREN) 1 % GEL, Apply topically 4 (four) times daily., Disp: , Rfl:  .  diltiazem (CARDIZEM CD) 180 MG 24 hr capsule, Take 1 capsule (180 mg total) by mouth daily., Disp: 90 capsule, Rfl: 3 .  ezetimibe (ZETIA) 10 MG tablet, Take 1 tablet (10 mg total) by mouth daily., Disp: 90 tablet, Rfl: 3 .  fluticasone (FLONASE) 50 MCG/ACT nasal spray, Place 2 sprays into both nostrils daily., Disp: 16 g, Rfl: 6 .  furosemide (LASIX) 20 MG tablet, TAKE 1 TABLET BY MOUTH DAILY AS NEEDED FOR  FLUID, Disp: 90 tablet,  Rfl: 1 .  gabapentin (NEURONTIN) 600 MG tablet, 600 mg 3 times daily.  Okay to increase nighttime dose to 1200 mg., Disp: 120 tablet, Rfl: 5 .  ibuprofen (ADVIL,MOTRIN) 200 MG tablet, Take 200 mg by mouth every 6 (six) hours as needed. For pain, Disp: , Rfl:  .  leflunomide (ARAVA) 10 MG tablet, Take 10 mg by mouth daily., Disp: , Rfl:  .  linaclotide (LINZESS) 72 MCG capsule, Take by mouth., Disp: , Rfl:  .  Multiple Vitamin (MULTIVITAMIN) tablet, Take 1 tablet by mouth daily.  , Disp: , Rfl:  .  Nutritional Supplements (ESTROVEN ENERGY PO), Take by mouth daily., Disp: , Rfl:  .  omeprazole (PRILOSEC) 20 MG capsule, Take 20 mg by mouth daily., Disp: , Rfl:  .  pantoprazole (PROTONIX) 40 MG tablet, Take by mouth., Disp: , Rfl:  .  potassium chloride SA (K-DUR) 20 MEQ tablet, TAKE 1 TABLET BY MOUTH DAILY. TAKE WITH FUROSEMIDE, Disp: 30 tablet, Rfl: 0 .  sertraline (ZOLOFT) 100 MG tablet, Take 1 tablet (100 mg total) by mouth daily., Disp: 90 tablet, Rfl: 1 .  topiramate (TOPAMAX) 25 MG tablet, Take 1 tablet (25 mg total) by mouth 2 (two) times daily., Disp: 60 tablet, Rfl: 5 .  traZODone (DESYREL) 50 MG tablet, TAKE 1 TO 2 TABLETS BY MOUTH AT BEDTIME, Disp: 180 tablet, Rfl: 1 .  valACYclovir (VALTREX) 1000 MG tablet, TAKE TWO TABLETS BY MOUTH TWICE DAILY FOR 5 DAYS AS NEEDED, Disp: 180 tablet, Rfl: 3 .  venlafaxine XR (EFFEXOR-XR) 75 MG 24 hr capsule, TAKE 3 CAPSULES BY MOUTH ONCE DAILY WITH BREAKFAST, Disp: 270 capsule, Rfl: 1 .  XIIDRA 5 % SOLN, Place 1 drop into both eyes daily., Disp: , Rfl:  .  methylPREDNISolone (MEDROL) 4 MG TBPK tablet, Follow package instructions., Disp: 21 tablet, Rfl: 0  ROS  Constitutional: Denies any fever or chills Gastrointestinal: No reported hemesis, hematochezia, vomiting, or acute GI distress Musculoskeletal: Denies any acute onset joint swelling, redness, loss of ROM, or weakness Neurological: No reported episodes of acute onset apraxia, aphasia, dysarthria,  agnosia, amnesia, paralysis, loss of coordination, or loss of consciousness  Allergies  Gabrielle Pineda is allergic to plaquenil [hydroxychloroquine].  PFSH  Drug: Gabrielle Pineda  reports no history of drug use. Alcohol:  reports no history of alcohol use. Tobacco:  reports that she has been smoking. She has a 40.00 pack-year smoking history. She has never used smokeless tobacco. Medical:  has a past medical history of Alcohol abuse, Anemia, Arthritis, Cervicalgia, COPD (chronic obstructive pulmonary disease) ([place]), Depression, GERD (gastroesophageal reflux disease), Heart murmur, Other and unspecified hyperlipidemia, and Tachycardia. Surgical: Gabrielle Pineda  has a past surgical history that includes neck disc surgery; Breast enhancement surgery (1981); Esophagogastroduodenoscopy; Tonsillectomy and adenoidectomy (1973 ); Carpal tunnel release (11/11/2011); Carpal tunnel release (2013); Rotator cuff repair (06/16/2016); Rotator cuff repair; Colonoscopy with propofol (N/A, 11/01/2017); and Augmentation mammaplasty (Bilateral, 1982). Family: family history includes Alcohol abuse in her father and mother; Bipolar disorder in her mother; Pancreatic cancer in her father; Suicidality in her mother.  Constitutional Exam  General appearance: Well nourished, well developed, and well hydrated. In no apparent acute distress Vitals:   02/23/19 1010  BP: 127/86  Pulse: 86  Resp: 16  Temp: 98.1 F (36.7 C)  TempSrc: Oral  SpO2: 98%  Weight: 140 lb (63.5 kg)  Height: 5' 9" (1.753 m)   BMI Assessment: Estimated body mass index is 20.67 kg/m as calculated from the following:   Height as of this encounter: 5' 9" (1.753 m).   Weight as of this encounter: 140 lb (63.5 kg).  BMI interpretation table: BMI level Category Range association with higher incidence of chronic pain  <18 kg/m2 Underweight   18.5-24.9 kg/m2 Ideal body weight   25-29.9 kg/m2 Overweight Increased incidence by 20%  30-34.9 kg/m2 Obese  (Class I) Increased incidence by 68%  35-39.9 kg/m2 Severe obesity (Class II) Increased incidence by 136%  >40 kg/m2 Extreme obesity (Class III) Increased incidence by 254%   Patient's current BMI Ideal Body weight  Body mass index is 20.67 kg/m. Ideal body weight: 66.2 kg (145 lb 15.1 oz)   BMI Readings from Last 4 Encounters:  02/23/19 20.67 kg/m  01/25/19 20.67 kg/m  11/02/18 19.94 kg/m  10/28/18 19.94 kg/m   Wt Readings from Last 4 Encounters:  02/23/19 140 lb (63.5 kg)  01/25/19 140 lb (63.5 kg)  11/02/18 135 lb (61.2 kg)  10/28/18 135 lb (61.2 kg)  Psych/Mental status: Alert, oriented x 3 (person, place, & time)       Eyes: PERLA Respiratory: No evidence of acute respiratory distress Cervical Spine Area Exam  Skin & Axial Inspection: Well healed scar from previous spine surgery detected Alignment: Asymmetric Functional ROM: Decreased ROM, to the right Stability: No instability detected Muscle Tone/Strength: Functionally intact. No obvious neuro-muscular anomalies detected. Sensory (Neurological): Musculoskeletal pain pattern on the right Palpation: Complains of area being tender to palpation Positive provocative maneuver for for cervical facet disease on the right         Upper Extremity (UE) Exam    Side: Right upper extremity  Side: Left upper extremity   Skin & Extremity Inspection: Skin color, temperature, and hair growth are WNL. No peripheral edema or cyanosis. No masses, redness, swelling, asymmetry, or associated skin lesions. No contractures.  Skin & Extremity Inspection: Skin color, temperature, and hair growth are WNL. No peripheral edema or cyanosis. No masses, redness, swelling, asymmetry, or associated skin lesions. No contractures.   Functional ROM: Unrestricted ROM          Functional ROM: Unrestricted ROM           Muscle Tone/Strength: Functionally intact. No obvious neuro-muscular anomalies detected.  Muscle Tone/Strength: Functionally intact.  No obvious neuro-muscular anomalies detected.   Sensory (Neurological): Unimpaired  Sensory (Neurological): Unimpaired           Palpation: No palpable anomalies              Palpation: No palpable anomalies               Specialized Test(s): Deferred         Specialized Test(s): Deferred           Thoracic Spine Area Exam  Skin & Axial Inspection: No masses, redness, or swelling Alignment: Symmetrical Functional ROM: Unrestricted ROM Stability: No instability detected Muscle Tone/Strength: Functionally intact. No obvious neuro-muscular anomalies detected. Sensory (Neurological): Unimpaired Muscle strength & Tone: No palpable anomalies  Lumbar Spine Area Exam  Skin & Axial Inspection: No masses, redness, or swelling Alignment: Symmetrical Functional ROM: Decreased ROM       Stability: No instability detected Muscle Tone/Strength: Functionally intact. No obvious neuro-muscular anomalies detected. Sensory (Neurological): Dermatomal pain pattern Palpation: No palpable anomalies       Provocative Tests: Hyperextension/rotation test: (+) bilaterally for facet joint pain. Lumbar quadrant test (Kemp's test): (+) bilaterally for facet joint pain. Lateral bending test: (+) due to pain. Patrick's Maneuver: (+) for bilateral S-I arthralgia             FABER* test: deferred today                   S-I anterior distraction/compression test: deferred today         S-I lateral compression test: deferred today         S-I Thigh-thrust test: deferred today         S-I Gaenslen's test: deferred today         *(Flexion, ABduction and External Rotation)  Gait & Posture Assessment  Ambulation: Unassisted Gait: Relatively normal for age and body habitus Posture: WNL   Lower Extremity Exam    Side: Right lower extremity  Side: Left lower extremity  Stability: No instability observed          Stability: No instability observed          Skin & Extremity Inspection: Skin color,  temperature, and hair growth are WNL. No peripheral edema or cyanosis. No masses, redness, swelling, asymmetry, or associated skin lesions. No contractures.  Skin & Extremity Inspection: Skin color, temperature, and hair growth are WNL. No peripheral edema or cyanosis. No masses, redness, swelling, asymmetry, or associated skin lesions. No contractures.  Functional ROM: Pain restricted ROM for all joints of the lower extremity          Functional ROM: Pain restricted ROM for all joints of the lower extremity          Muscle Tone/Strength: Functionally intact. No obvious neuro-muscular anomalies detected.  Muscle Tone/Strength: Functionally intact. No obvious neuro-muscular anomalies detected.  Sensory (Neurological): Dermatomal pain pattern        Sensory (Neurological): Dermatomal pain pattern        DTR: Patellar: deferred today Achilles: deferred today Plantar: deferred today  DTR: Patellar: deferred today Achilles: deferred today Plantar: deferred today       Assessment   Status Diagnosis  Having a Flare-up Worsening Persistent 1. Lumbar radiculopathy (R>L)   2. Chronic radicular lumbar pain   3. Lumbar facet arthropathy   4. Lumbar degenerative disc disease   5. Spinal stenosis, lumbar region, with neurogenic claudication   6. Chronic pain syndrome   7. Other intervertebral disc degeneration, lumbar region      Worsening low back  and leg pain likely related to lumbar radiculopathy.  Lumbar ESI not helpful.  Having increased pain with walking.  Also feels heaviness in the back of her legs, could be consistent with spinal stenosis with neurogenic claudication.  CT myelogram of lumbar spine done over 3 years ago.  Recommend lumbar MRI to evaluate for new pathology accounting for symptoms.   Plan of Care  Pharmacotherapy (Medications Ordered): Meds ordered this encounter  Medications  . methylPREDNISolone (MEDROL) 4 MG TBPK tablet    Sig: Follow package instructions.     Dispense:  21 tablet    Refill:  0    Do not add to the "Automatic Refill" notification system.   Orders:  Orders Placed This Encounter  Procedures  . MR LUMBAR SPINE WO CONTRAST    In addition to any acute findings, please report on degenerative changes related to: (Please specify level(s)) (1) ROM & instability (>24m displacement) (2) Facet joint (Zygoapophyseal Joint) (3) DDD and/or IVDD (4) Pars defects (5) Previous surgical changes (Include description of hardware and hardware status, if present) (6) Presence and degree of spondylolisthesis, spondylosis, and/or spondyloarthropathies)  (7) Old Fractures (8) Demineralization (9) Additional bone pathology (10) Stenosis (Central, Lateral Recess, Foraminal) (11) If at all possible, please provide AP diameter (mm) of foraminal and/or central canal.    Standing Status:   Future    Standing Expiration Date:   05/26/2019    Order Specific Question:   What is the patient's sedation requirement?    Answer:   No Sedation    Order Specific Question:   Does the patient have a pacemaker or implanted devices?    Answer:   No    Order Specific Question:   Preferred imaging location?    Answer:   [facility] (table limit-350lbs)    Order Specific Question:   Call Results- Best Contact Number?    Answer:   [phone]([facility]    Order Specific Question:   Radiology Contrast Protocol - do NOT remove file path    Answer:   _[email or web address]    Imaging Orders     MR LUMBAR SPINE WO CONTRAST Planned follow-up:   Return for After Imaging, will call patient to discuss treatment plan.        Recent Visits Date Type Provider Dept  01/25/19 Procedure visit LGillis Santa MD [facility]  Showing recent visits within past 90 days and meeting all other requirements   Today's Visits Date Type Provider Dept  02/23/19 Office Visit LGillis Santa MD [facility]  Showing today's  visits and meeting all other requirements   Future Appointments No visits were found meeting these conditions.  Showing future appointments within next 90 days and meeting all other requirements   Primary Care Physician: BMar Daring PA-C [facility] Note by: BGillis Santa MD Date: 02/23/2019; Time: 11:13 AM  Note: This dictation was prepared with Dragon dictation. Any transcriptional errors that may result from this process are unintentional.

## 2019-02-23 NOTE — Patient Instructions (Signed)
Methylprednisolone has been escribed to your pharmacy. You will have MRI prior to next appt. at pain clinic.

## 2019-03-02 ENCOUNTER — Ambulatory Visit: Payer: BC Managed Care – PPO | Admitting: Student in an Organized Health Care Education/Training Program

## 2019-03-03 ENCOUNTER — Encounter: Payer: Self-pay | Admitting: Student in an Organized Health Care Education/Training Program

## 2019-03-07 DIAGNOSIS — Z1211 Encounter for screening for malignant neoplasm of colon: Secondary | ICD-10-CM | POA: Diagnosis not present

## 2019-03-07 DIAGNOSIS — R1011 Right upper quadrant pain: Secondary | ICD-10-CM | POA: Diagnosis not present

## 2019-03-07 DIAGNOSIS — Z800 Family history of malignant neoplasm of digestive organs: Secondary | ICD-10-CM | POA: Diagnosis not present

## 2019-03-07 DIAGNOSIS — Z01818 Encounter for other preprocedural examination: Secondary | ICD-10-CM | POA: Diagnosis not present

## 2019-03-07 DIAGNOSIS — K5900 Constipation, unspecified: Secondary | ICD-10-CM | POA: Diagnosis not present

## 2019-03-09 ENCOUNTER — Other Ambulatory Visit: Payer: Self-pay | Admitting: Physician Assistant

## 2019-03-09 DIAGNOSIS — B001 Herpesviral vesicular dermatitis: Secondary | ICD-10-CM

## 2019-03-18 ENCOUNTER — Other Ambulatory Visit: Payer: BC Managed Care – PPO

## 2019-03-20 DIAGNOSIS — Z9889 Other specified postprocedural states: Secondary | ICD-10-CM | POA: Diagnosis not present

## 2019-03-20 DIAGNOSIS — K219 Gastro-esophageal reflux disease without esophagitis: Secondary | ICD-10-CM | POA: Diagnosis not present

## 2019-03-20 DIAGNOSIS — D124 Benign neoplasm of descending colon: Secondary | ICD-10-CM | POA: Diagnosis not present

## 2019-03-20 DIAGNOSIS — J449 Chronic obstructive pulmonary disease, unspecified: Secondary | ICD-10-CM | POA: Diagnosis not present

## 2019-03-20 DIAGNOSIS — K621 Rectal polyp: Secondary | ICD-10-CM | POA: Diagnosis not present

## 2019-03-20 DIAGNOSIS — F17210 Nicotine dependence, cigarettes, uncomplicated: Secondary | ICD-10-CM | POA: Diagnosis not present

## 2019-03-20 DIAGNOSIS — K5900 Constipation, unspecified: Secondary | ICD-10-CM | POA: Diagnosis not present

## 2019-03-20 DIAGNOSIS — G894 Chronic pain syndrome: Secondary | ICD-10-CM | POA: Diagnosis not present

## 2019-03-20 DIAGNOSIS — F329 Major depressive disorder, single episode, unspecified: Secondary | ICD-10-CM | POA: Diagnosis not present

## 2019-03-20 DIAGNOSIS — M069 Rheumatoid arthritis, unspecified: Secondary | ICD-10-CM | POA: Diagnosis not present

## 2019-03-20 DIAGNOSIS — Z1211 Encounter for screening for malignant neoplasm of colon: Secondary | ICD-10-CM | POA: Diagnosis not present

## 2019-03-20 DIAGNOSIS — Z791 Long term (current) use of non-steroidal anti-inflammatories (NSAID): Secondary | ICD-10-CM | POA: Diagnosis not present

## 2019-03-20 DIAGNOSIS — R1011 Right upper quadrant pain: Secondary | ICD-10-CM | POA: Diagnosis not present

## 2019-03-20 DIAGNOSIS — D638 Anemia in other chronic diseases classified elsewhere: Secondary | ICD-10-CM | POA: Diagnosis not present

## 2019-03-20 DIAGNOSIS — Z79899 Other long term (current) drug therapy: Secondary | ICD-10-CM | POA: Diagnosis not present

## 2019-03-20 DIAGNOSIS — I10 Essential (primary) hypertension: Secondary | ICD-10-CM | POA: Diagnosis not present

## 2019-03-20 DIAGNOSIS — Z800 Family history of malignant neoplasm of digestive organs: Secondary | ICD-10-CM | POA: Diagnosis not present

## 2019-03-20 DIAGNOSIS — D128 Benign neoplasm of rectum: Secondary | ICD-10-CM | POA: Diagnosis not present

## 2019-03-20 DIAGNOSIS — K6389 Other specified diseases of intestine: Secondary | ICD-10-CM | POA: Diagnosis not present

## 2019-03-27 ENCOUNTER — Other Ambulatory Visit: Payer: Self-pay

## 2019-03-27 ENCOUNTER — Ambulatory Visit
Admission: RE | Admit: 2019-03-27 | Discharge: 2019-03-27 | Disposition: A | Discharge status: Home / Self Care | Payer: Medicare Other | Source: Ambulatory Visit | Attending: Physician Assistant | Admitting: Physician Assistant

## 2019-03-27 DIAGNOSIS — Z1231 Encounter for screening mammogram for malignant neoplasm of breast: Secondary | ICD-10-CM | POA: Diagnosis not present

## 2019-03-29 ENCOUNTER — Other Ambulatory Visit: Payer: Self-pay | Admitting: Physician Assistant

## 2019-03-29 ENCOUNTER — Other Ambulatory Visit: Payer: Self-pay | Admitting: Cardiovascular Disease

## 2019-03-29 DIAGNOSIS — B001 Herpesviral vesicular dermatitis: Secondary | ICD-10-CM

## 2019-04-09 ENCOUNTER — Other Ambulatory Visit: Payer: Self-pay | Admitting: Physician Assistant

## 2019-04-09 DIAGNOSIS — F321 Major depressive disorder, single episode, moderate: Secondary | ICD-10-CM

## 2019-04-15 ENCOUNTER — Ambulatory Visit
Admission: RE | Admit: 2019-04-15 | Discharge: 2019-04-15 | Disposition: A | Discharge status: Home / Self Care | Payer: Medicare Other | Source: Ambulatory Visit | Attending: Student in an Organized Health Care Education/Training Program | Admitting: Student in an Organized Health Care Education/Training Program

## 2019-04-15 ENCOUNTER — Other Ambulatory Visit: Payer: Self-pay

## 2019-04-15 DIAGNOSIS — G8929 Other chronic pain: Secondary | ICD-10-CM

## 2019-04-15 DIAGNOSIS — M48061 Spinal stenosis, lumbar region without neurogenic claudication: Secondary | ICD-10-CM | POA: Diagnosis not present

## 2019-04-15 DIAGNOSIS — M5416 Radiculopathy, lumbar region: Secondary | ICD-10-CM

## 2019-04-17 ENCOUNTER — Other Ambulatory Visit: Payer: Self-pay

## 2019-04-17 ENCOUNTER — Other Ambulatory Visit (HOSPITAL_COMMUNITY)
Admission: RE | Admit: 2019-04-17 | Discharge: 2019-04-17 | Disposition: A | Discharge status: Home / Self Care | Payer: Medicare Other | Source: Ambulatory Visit | Attending: Physician Assistant | Admitting: Physician Assistant

## 2019-04-17 ENCOUNTER — Encounter: Payer: Self-pay | Admitting: Physician Assistant

## 2019-04-17 ENCOUNTER — Ambulatory Visit (INDEPENDENT_AMBULATORY_CARE_PROVIDER_SITE_OTHER): Payer: Medicare Other | Admitting: Physician Assistant

## 2019-04-17 VITALS — BP 120/76 | HR 80 | Temp 97.5°F | Resp 16 | Ht 69.0 in | Wt 149.0 lb

## 2019-04-17 DIAGNOSIS — Z780 Asymptomatic menopausal state: Secondary | ICD-10-CM | POA: Diagnosis not present

## 2019-04-17 DIAGNOSIS — K120 Recurrent oral aphthae: Secondary | ICD-10-CM

## 2019-04-17 DIAGNOSIS — F331 Major depressive disorder, recurrent, moderate: Secondary | ICD-10-CM

## 2019-04-17 DIAGNOSIS — M069 Rheumatoid arthritis, unspecified: Secondary | ICD-10-CM

## 2019-04-17 DIAGNOSIS — Z124 Encounter for screening for malignant neoplasm of cervix: Secondary | ICD-10-CM | POA: Insufficient documentation

## 2019-04-17 DIAGNOSIS — F5101 Primary insomnia: Secondary | ICD-10-CM

## 2019-04-17 DIAGNOSIS — R87615 Unsatisfactory cytologic smear of cervix: Secondary | ICD-10-CM | POA: Diagnosis not present

## 2019-04-17 DIAGNOSIS — Z0000 Encounter for general adult medical examination without abnormal findings: Secondary | ICD-10-CM | POA: Diagnosis not present

## 2019-04-17 DIAGNOSIS — I471 Supraventricular tachycardia: Secondary | ICD-10-CM | POA: Diagnosis not present

## 2019-04-17 DIAGNOSIS — Z1151 Encounter for screening for human papillomavirus (HPV): Secondary | ICD-10-CM | POA: Diagnosis not present

## 2019-04-17 DIAGNOSIS — J418 Mixed simple and mucopurulent chronic bronchitis: Secondary | ICD-10-CM

## 2019-04-17 DIAGNOSIS — E7800 Pure hypercholesterolemia, unspecified: Secondary | ICD-10-CM

## 2019-04-17 DIAGNOSIS — Z23 Encounter for immunization: Secondary | ICD-10-CM

## 2019-04-17 DIAGNOSIS — I10 Essential (primary) hypertension: Secondary | ICD-10-CM

## 2019-04-17 MED ORDER — FIRST-DUKES MOUTHWASH MT SUSP: 5.0000 mL | Freq: Three times a day (TID) | OROMUCOSAL | 0 refills | Status: DC

## 2019-04-17 MED ORDER — TRAZODONE HCL 100 MG PO TABS: 200.0000 mg | ORAL_TABLET | Freq: Every day | ORAL | 1 refills | Status: DC

## 2019-04-17 NOTE — Progress Notes (Signed)
Patient: Gabrielle Pineda, Female    DOB: February 28, 1956, 63 y.o.   MRN: [id number] Visit Date: 04/17/2019  Today's Provider: [name], PA-C   Chief Complaint  Patient presents with  . Medicare Wellness   Subjective:     Annual wellness visit Gabrielle Pineda is a 63 y.o. female. She feels well. She reports exercising some. She reports she is sleeping poorly.  She is taking Trazodone 150mg  currently to help sleep. She reports this helps her get approx 4-5 hours of sleep. She does not take the 150mg  nightly due to she wanted to check to make sure that is ok.   She also reports having a "sore/bump" on the tip of her tongue. She has tried OTC mouth rinses without relief. Patient is still smoking currently.  ----------------------------------------------------------   Review of Systems  Constitutional: Positive for appetite change and unexpected weight change (weight gain of 15 pounds).  HENT: Positive for congestion, mouth sores, postnasal drip, rhinorrhea and sinus pressure.   Eyes: Positive for discharge and redness.  Respiratory: Positive for shortness of breath and wheezing.   Cardiovascular: Negative.   Gastrointestinal: Negative.   Endocrine: Positive for polyphagia.  Genitourinary: Positive for decreased urine volume and difficulty urinating.  Musculoskeletal: Positive for arthralgias, back pain, neck pain and neck stiffness.  Skin: Negative.   Allergic/Immunologic: Positive for environmental allergies.  Neurological: Positive for headaches.  Hematological: Negative.   Psychiatric/Behavioral: Positive for agitation and sleep disturbance. The patient is nervous/anxious.     Social History   Socioeconomic History  . Marital status: Married    Spouse name: Not on file  . Number of children: Not on file  . Years of education: Not on file  . Highest education level: Not on file  Occupational History  . Not on file  Social Needs  . Financial resource  strain: Not on file  . Food insecurity    Worry: Not on file    Inability: Not on file  . Transportation needs    Medical: Not on file    Non-medical: Not on file  Tobacco Use  . Smoking status: Current Every Day Smoker    Packs/day: 1.00    Years: 40.00    Pack years: 40.00  . Smokeless tobacco: Never Used  Substance and Sexual Activity  . Alcohol use: No    Comment: recovering alcoholic Since [id number]   . Drug use: No  . Sexual activity: Never  Lifestyle  . Physical activity    Days per week: Not on file    Minutes per session: Not on file  . Stress: Not on file  Relationships  . Social Herbalist on phone: Not on file    Gets together: Not on file    Attends religious service: Not on file    Active member of club or organization: Not on file    Attends meetings of clubs or organizations: Not on file    Relationship status: Not on file  . Intimate partner violence    Fear of current or ex partner: Not on file    Emotionally abused: Not on file    Physically abused: Not on file    Forced sexual activity: Not on file  Other Topics Concern  . Not on file  Social History Narrative   Married; full time; does not get regular exercise.     Past Medical History:  Diagnosis Date  . Alcohol abuse   .  Anemia   . Arthritis   . Cervicalgia   . COPD (chronic obstructive pulmonary disease) ([place])   . Depression   . GERD (gastroesophageal reflux disease)   . Heart murmur    on heard when pt is lying  . Other and unspecified hyperlipidemia   . Tachycardia    d/t questionable anxiety happens every 5- 10 years, sees [facility]     Patient Active Problem List   Diagnosis Date Noted  . History of adenomatous polyp of colon 11/04/2017  . Cervical radiculopathy 08/02/2017  . Neuropathy 08/02/2017  . Essential hypertension 07/28/2017  . Closed compression fracture of L5 lumbar vertebra 07/07/2017  . Sacral insufficiency fracture with routine healing 07/07/2017   . COPD (chronic obstructive pulmonary disease) ([place]) 04/10/2015  . Family history of malignant neoplasm of pancreas 04/03/2015  . Hypercholesteremia 04/03/2015  . Disorder of iron metabolism 04/03/2015  . Weight loss 04/03/2015  . Delayed onset of urination 04/03/2015  . Acid reflux 10/11/2014  . Closed fracture of distal phalanx of thumb 08/15/2014  . Arthritis, degenerative 01/30/2014  . Arthritis or polyarthritis, rheumatoid ([place]) 01/30/2014  . Shortness of breath 12/22/2013  . Hyperlipidemia 09/22/2010  . SMOKER 09/22/2010  . Paroxysmal supraventricular tachycardia ([place]) 09/22/2010  . CHEST PAIN UNSPECIFIED 09/22/2010  . ELECTROCARDIOGRAM, ABNORMAL 09/22/2010  . B-complex deficiency 09/09/2007  . CN (constipation) 06/26/2007  . Clinical depression 06/26/2007  . Cold sore 06/26/2007  . H/O alcohol abuse 06/26/2007  . Cannot sleep 06/26/2007  . Localized osteoarthrosis, hand 06/26/2007  . Menopausal symptom 06/26/2007    Past Surgical History:  Procedure Laterality Date  . AUGMENTATION MAMMAPLASTY Bilateral 1982  . BREAST ENHANCEMENT SURGERY  1981  . CARPAL TUNNEL RELEASE  11/11/2011   Procedure: CARPAL TUNNEL RELEASE;  Surgeon: Floyce Stakes, MD;  Location: [facility];  Service: Neurosurgery;  Laterality: Right;  Right Median Nerve Decompression  . CARPAL TUNNEL RELEASE  2013  . COLONOSCOPY WITH PROPOFOL N/A 11/01/2017   Procedure: COLONOSCOPY WITH PROPOFOL;  Surgeon: Manya Silvas, MD;  Location: [facility];  Service: Endoscopy;  Laterality: N/A;  . ESOPHAGOGASTRODUODENOSCOPY    . neck disc surgery     plates and screws in neck x 2  . ROTATOR CUFF REPAIR  06/16/2016   right shoulder   . ROTATOR CUFF REPAIR    . TONSILLECTOMY AND ADENOIDECTOMY  1973     Her family history includes Alcohol abuse in her father and mother; Bipolar disorder in her mother; Pancreatic cancer in her father; Suicidality in her mother. There is no history of Anesthesia problems or  Breast cancer.   Current Outpatient Medications:  .  acyclovir cream (ZOVIRAX) 5 %, APPLY TOPICALLY EVERY 4 HOURS, Disp: 15 g, Rfl: 0 .  ANORO ELLIPTA 62.5-25 MCG/INH AEPB, , Disp: , Rfl:  .  ARIPiprazole (ABILIFY) 10 MG tablet, Take 1 tablet by mouth once daily, Disp: 90 tablet, Rfl: 1 .  atorvastatin (LIPITOR) 40 MG tablet, Take 1 tablet by mouth once daily, Disp: 90 tablet, Rfl: 0 .  busPIRone (BUSPAR) 30 MG tablet, TAKE 1 TABLET BY MOUTH THREE TIMES DAILY, Disp: 90 tablet, Rfl: 5 .  diclofenac sodium (VOLTAREN) 1 % GEL, Apply topically 4 (four) times daily., Disp: , Rfl:  .  diltiazem (CARDIZEM CD) 180 MG 24 hr capsule, Take 1 capsule (180 mg total) by mouth daily., Disp: 90 capsule, Rfl: 3 .  ezetimibe (ZETIA) 10 MG tablet, Take 1 tablet (10 mg total) by mouth daily., Disp:  90 tablet, Rfl: 3 .  fluticasone (FLONASE) 50 MCG/ACT nasal spray, Place 2 sprays into both nostrils daily., Disp: 16 g, Rfl: 6 .  furosemide (LASIX) 20 MG tablet, TAKE 1 TABLET BY MOUTH DAILY AS NEEDED FOR  FLUID, Disp: 90 tablet, Rfl: 1 .  gabapentin (NEURONTIN) 600 MG tablet, 600 mg 3 times daily.  Okay to increase nighttime dose to 1200 mg., Disp: 120 tablet, Rfl: 5 .  ibuprofen (ADVIL,MOTRIN) 200 MG tablet, Take 200 mg by mouth every 6 (six) hours as needed. For pain, Disp: , Rfl:  .  leflunomide (ARAVA) 10 MG tablet, Take 10 mg by mouth daily., Disp: , Rfl:  .  linaclotide (LINZESS) 72 MCG capsule, Take by mouth., Disp: , Rfl:  .  Multiple Vitamin (MULTIVITAMIN) tablet, Take 1 tablet by mouth daily.  , Disp: , Rfl:  .  Nutritional Supplements (ESTROVEN ENERGY PO), Take by mouth daily., Disp: , Rfl:  .  omeprazole (PRILOSEC) 20 MG capsule, Take 20 mg by mouth daily., Disp: , Rfl:  .  pantoprazole (PROTONIX) 40 MG tablet, Take by mouth., Disp: , Rfl:  .  potassium chloride SA (K-DUR) 20 MEQ tablet, TAKE 1 TABLET BY MOUTH DAILY. TAKE WITH FUROSEMIDE, Disp: 30 tablet, Rfl: 0 .  sertraline (ZOLOFT) 100 MG tablet, Take  1 tablet (100 mg total) by mouth daily., Disp: 90 tablet, Rfl: 1 .  topiramate (TOPAMAX) 25 MG tablet, Take 1 tablet (25 mg total) by mouth 2 (two) times daily., Disp: 60 tablet, Rfl: 5 .  traZODone (DESYREL) 50 MG tablet, TAKE 1 TO 2 TABLETS BY MOUTH AT BEDTIME, Disp: 180 tablet, Rfl: 1 .  valACYclovir (VALTREX) 1000 MG tablet, TAKE 2 TABLETS BY MOUTH TWICE DAILY FOR 5 DAYS AS NEEDED, Disp: 30 tablet, Rfl: 11 .  venlafaxine XR (EFFEXOR-XR) 75 MG 24 hr capsule, TAKE 3 CAPSULES BY MOUTH ONCE DAILY WITH BREAKFAST, Disp: 270 capsule, Rfl: 1 .  XIIDRA 5 % SOLN, Place 1 drop into both eyes daily., Disp: , Rfl:  .  b complex vitamins tablet, Take 1 tablet by mouth daily. B12, Disp: , Rfl:   Patient Care Team: [name], PA-C as PCP - General (Family Medicine) Manya Silvas, MD (Gastroenterology)    Objective:    Vitals: Resp 16   Ht 5\' 9"  (1.753 m)   Wt 149 lb (67.6 kg)   BMI 22.00 kg/m   Physical Exam Vitals signs reviewed.  Constitutional:      General: She is not in acute distress.    Appearance: Normal appearance. She is well-developed and normal weight. She is not ill-appearing or diaphoretic.  HENT:     Head: Normocephalic and atraumatic.     Right Ear: Hearing, tympanic membrane, ear canal and external ear normal.     Left Ear: Hearing, tympanic membrane, ear canal and external ear normal.     Nose: Nose normal.     Mouth/Throat:     Mouth: Mucous membranes are moist.     Tongue: Lesions present.     Pharynx: Uvula midline. No oropharyngeal exudate.   Eyes:     General: No scleral icterus.       Right eye: No discharge.        Left eye: No discharge.     Extraocular Movements: Extraocular movements intact.     Conjunctiva/sclera: Conjunctivae normal.     Pupils: Pupils are equal, round, and reactive to light.  Neck:     Musculoskeletal: Normal range of  motion and neck supple.     Thyroid: No thyromegaly.     Vascular: No carotid bruit or JVD.     Trachea:  No tracheal deviation.  Cardiovascular:     Rate and Rhythm: Normal rate and regular rhythm.     Pulses: Normal pulses.     Heart sounds: Normal heart sounds. No murmur. No friction rub. No gallop.   Pulmonary:     Effort: Pulmonary effort is normal. No respiratory distress.     Breath sounds: Normal breath sounds. No wheezing or rales.  Chest:     Chest wall: No tenderness.     Breasts: Breasts are symmetrical.        Right: No inverted nipple, mass, nipple discharge, skin change or tenderness.        Left: No inverted nipple, mass, nipple discharge, skin change or tenderness.  Abdominal:     General: Bowel sounds are normal. There is no distension.     Palpations: Abdomen is soft. There is no mass.     Tenderness: There is no abdominal tenderness. There is no guarding or rebound.     Hernia: There is no hernia in the left inguinal area.  Genitourinary:    General: Normal vulva.     Exam position: Supine.     Labia:        Right: No rash, tenderness, lesion or injury.        Left: No rash, tenderness, lesion or injury.      Vagina: Normal. No signs of injury. No vaginal discharge, erythema, tenderness or bleeding.     Cervix: No cervical motion tenderness, discharge or friability.     Adnexa:        Right: No mass, tenderness or fullness.         Left: No mass, tenderness or fullness.       Rectum: Normal.  Musculoskeletal: Normal range of motion.        General: No tenderness.     Right lower leg: No edema.     Left lower leg: No edema.  Lymphadenopathy:     Cervical: No cervical adenopathy.  Skin:    General: Skin is warm and dry.     Capillary Refill: Capillary refill takes less than 2 seconds.     Findings: No rash.  Neurological:     General: No focal deficit present.     Mental Status: She is alert and oriented to person, place, and time. Mental status is at baseline.     Cranial Nerves: No cranial nerve deficit.     Coordination: Coordination normal.     Deep  Tendon Reflexes: Reflexes are normal and symmetric.  Psychiatric:        Mood and Affect: Mood normal.        Behavior: Behavior normal.        Thought Content: Thought content normal.        Judgment: Judgment normal.     Activities of Daily Living In your present state of health, do you have any difficulty performing the following activities: 04/17/2019  Hearing? N  Vision? N  Difficulty concentrating or making decisions? N  Walking or climbing stairs? N  Dressing or bathing? N  Doing errands, shopping? N  Some recent data might be hidden    Fall Risk Assessment Fall Risk  02/23/2019 01/25/2019 11/02/2018 09/13/2018 07/18/2018  Falls in the past year? 0 0 0 0 0  Comment - - - - -  Number falls in past yr: - - 0 - -  Injury with Fall? - - 0 - -  Risk for fall due to : - - - - Medication side effect  Follow up - - - - -     Depression Screen PHQ 2/9 Scores 04/17/2019 01/25/2019 11/02/2018 09/13/2018  PHQ - 2 Score 2 0 1 0  PHQ- 9 Score 14 - - -    No flowsheet data found.    Assessment & Plan:     Annual Wellness Visit  Reviewed patient's Family Medical History Reviewed and updated list of patient's medical providers Assessment of cognitive impairment was done Assessed patient's functional ability Established a written schedule for health screening [place] Completed and Reviewed  Exercise Activities and Dietary recommendations Goals   None     Immunization History  Administered Date(s) Administered  . Influenza,inj,Quad PF,6+ Mos 11/18/2015, 07/23/2016, 07/02/2017, 04/14/2018  . Influenza-Unspecified 09/24/2014  . Pneumococcal Polysaccharide-23 09/24/2014  . Tdap 11/15/2015    Health Maintenance  Topic Date Due  . INFLUENZA VACCINE  03/18/2019  . PAP SMEAR-Modifier  04/09/2019  . MAMMOGRAM  03/26/2021  . COLONOSCOPY  11/02/2022  . TETANUS/TDAP  11/14/2025  . Hepatitis C Screening  Completed  . HIV Screening  Discontinued      Discussed health benefits of physical activity, and encouraged her to engage in regular exercise appropriate for her age and condition.    1. Initial Medicare annual wellness visit Normal exam. Up to date on screenings and vaccinations. EKG today shows NSR rate of 71 with no ST segment changes.  - EKG 12-Lead  2. Cervical cancer screening Pap collected today. Will send as below and f/u pending results. - Cytology - PAP  3. Rheumatoid arthritis involving multiple sites, unspecified rheumatoid factor presence (HCC) Stable. Followed by Dr. Precious Reel, rheumatology. - CBC with Differential/Platelet - Comprehensive metabolic panel  4. Paroxysmal supraventricular tachycardia (HCC) Stable. Continue Diltiazem CR 180mg . Will check labs as below and f/u pending results. - CBC with Differential/Platelet - Comprehensive metabolic panel - TSH - EKG 12-Lead  5. Essential hypertension Stable. Continue diltiazem daily. Will check labs as below and f/u pending results. - CBC with Differential/Platelet - Comprehensive metabolic panel - Lipid panel - TSH  6. Mixed simple and mucopurulent chronic bronchitis (HCC) Newly on Ipratropium nebulizer treatments and Anoro. Followed by Pulmonology. Will check labs as below and f/u pending results. - CBC with Differential/Platelet - Comprehensive metabolic panel - ipratropium (ATROVENT) 0.02 % nebulizer solution; Take 2.5 mLs (0.5 mg total) by nebulization 4 (four) times daily.  Dispense: 75 mL; Refill: 12  7. Hypercholesteremia Stable. Continue Atorvastatin 40mg  and Zetia 10mg . Will check labs as below and f/u pending results. - CBC with Differential/Platelet - Comprehensive metabolic panel - Lipid panel  8. Moderate episode of recurrent major depressive disorder (HCC) Stable. Continue Abilify 10mg , buspar 30mg , sertraline 100mg , and trazodone 200mg . Will check labs as below and f/u pending results. - CBC with Differential/Platelet -  Comprehensive metabolic panel - TSH  9. Primary insomnia Trazodone increased to 200mg  as below. Call if not effective.  - CBC with Differential/Platelet - Comprehensive metabolic panel - TSH - traZODone (DESYREL) 100 MG tablet; Take 2 tablets (200 mg total) by mouth at bedtime.  Dispense: 180 tablet; Refill: 1  10. Aphthous ulcer of tongue Present x 1 month. Failed OTC mouth solutions. Will try [facility] as below. If no improvement may refer to ENT for further evaluation since patient is  a smoker.  - Diphenhyd-Hydrocort-Nystatin (FIRST-DUKES MOUTHWASH) SUSP; Take 5 mLs by mouth 3 (three) times daily. Swish and spit  Dispense: 240 mL; Refill: 0  11. Need for influenza vaccination Flu vaccine given today without complication. Patient sat upright for 15 minutes to check for adverse reaction before being released. - Flu Vaccine QUAD 36+ mos IM  ------------------------------------------------------------------------------------------------------------    [name], PA-C  [facility]

## 2019-04-17 NOTE — Patient Instructions (Signed)

## 2019-04-18 ENCOUNTER — Telehealth: Payer: Self-pay

## 2019-04-18 LAB — CBC WITH DIFFERENTIAL/PLATELET
Basophils Absolute: 0.1 10*3/uL (ref 0.0–0.2)
Basos: 1 %
EOS (ABSOLUTE): 0.2 10*3/uL (ref 0.0–0.4)
Eos: 3 %
Hematocrit: 36.4 % (ref 34.0–46.6)
Hemoglobin: 12 g/dL (ref 11.1–15.9)
Immature Grans (Abs): 0 10*3/uL (ref 0.0–0.1)
Immature Granulocytes: 0 %
Lymphocytes Absolute: 1.6 10*3/uL (ref 0.7–3.1)
Lymphs: 28 %
MCH: 33.9 pg — ABNORMAL HIGH (ref 26.6–33.0)
MCHC: 33 g/dL (ref 31.5–35.7)
MCV: 103 fL — ABNORMAL HIGH (ref 79–97)
Monocytes Absolute: 0.5 10*3/uL (ref 0.1–0.9)
Monocytes: 10 %
Neutrophils Absolute: 3.3 10*3/uL (ref 1.4–7.0)
Neutrophils: 58 %
Platelets: 230 10*3/uL (ref 150–450)
RBC: 3.54 x10E6/uL — ABNORMAL LOW (ref 3.77–5.28)
RDW: 12.5 % (ref 11.7–15.4)
WBC: 5.6 10*3/uL (ref 3.4–10.8)

## 2019-04-18 LAB — LIPID PANEL
Chol/HDL Ratio: 2.9 ratio (ref 0.0–4.4)
Cholesterol, Total: 158 mg/dL (ref 100–199)
HDL: 54 mg/dL (ref >39)
LDL Chol Calc (NIH): 80 mg/dL (ref 0–99)
Triglycerides: 135 mg/dL (ref 0–149)
VLDL Cholesterol Cal: 24 mg/dL (ref 5–40)

## 2019-04-18 LAB — COMPREHENSIVE METABOLIC PANEL
ALT: 17 IU/L (ref 0–32)
AST: 19 IU/L (ref 0–40)
Albumin/Globulin Ratio: 2.3 — ABNORMAL HIGH (ref 1.2–2.2)
Albumin: 4.3 g/dL (ref 3.8–4.8)
Alkaline Phosphatase: 97 IU/L (ref 39–117)
BUN/Creatinine Ratio: 16 (ref 12–28)
BUN: 14 mg/dL (ref 8–27)
Bilirubin Total: 0.2 mg/dL (ref 0.0–1.2)
CO2: 21 mmol/L (ref 20–29)
Calcium: 9.2 mg/dL (ref 8.7–10.3)
Chloride: 108 mmol/L — ABNORMAL HIGH (ref 96–106)
Creatinine, Ser: 0.88 mg/dL (ref 0.57–1.00)
GFR calc Af Amer: 81 mL/min/{1.73_m2} (ref >59)
GFR calc non Af Amer: 71 mL/min/{1.73_m2} (ref >59)
Globulin, Total: 1.9 g/dL (ref 1.5–4.5)
Glucose: 88 mg/dL (ref 65–99)
Potassium: 3.6 mmol/L (ref 3.5–5.2)
Sodium: 147 mmol/L — ABNORMAL HIGH (ref 134–144)
Total Protein: 6.2 g/dL (ref 6.0–8.5)

## 2019-04-18 LAB — TSH: TSH: 1.31 u[IU]/mL (ref 0.450–4.500)

## 2019-04-18 NOTE — Telephone Encounter (Signed)
Patient advised.

## 2019-04-18 NOTE — Telephone Encounter (Signed)
-----   Message from [name], PA-C sent at 04/18/2019  2:23 PM EDT ----- Blood count is stable. Kidney and liver function are normal. Sodium and chloride are borderline high. Cut back on salt in diet some. Watch processed foods as they have a lot of hidden sodium. Potassium and calcium are normal. Thyroid is normal. Cholesterol is great!

## 2019-04-19 ENCOUNTER — Telehealth: Payer: Self-pay

## 2019-04-19 ENCOUNTER — Encounter: Payer: Self-pay | Admitting: Physician Assistant

## 2019-04-19 DIAGNOSIS — Z79899 Other long term (current) drug therapy: Secondary | ICD-10-CM | POA: Diagnosis not present

## 2019-04-19 DIAGNOSIS — M0579 Rheumatoid arthritis with rheumatoid factor of multiple sites without organ or systems involvement: Secondary | ICD-10-CM | POA: Diagnosis not present

## 2019-04-19 LAB — CYTOLOGY - PAP: HPV: NOT DETECTED

## 2019-04-19 NOTE — Telephone Encounter (Signed)
-----   Message from [name], [place] sent at 04/19/2019 10:06 AM EDT ----- Pap is unsatisfactory due to inflammation. May need to return for a vaginal swab to check for BV or yeast. We can treat then repeat pap after treatment.

## 2019-04-19 NOTE — Telephone Encounter (Signed)
LMTCB or to view it in mychart. 

## 2019-04-19 NOTE — Telephone Encounter (Signed)
Pt returned call. She said you may be able to reach her at [phone]  [facility]

## 2019-04-19 NOTE — Telephone Encounter (Signed)
Patient sent mychart message to pcp, concerning message below.

## 2019-04-20 NOTE — Progress Notes (Signed)
Patient: Gabrielle Pineda Female    DOB: July 09, 1956   63 y.o.   MRN: [id number] Visit Date: 04/21/2019  Today's Provider: [name], PA-C   No chief complaint on file.  Subjective:     HPI   Patient had pap on 04/17/19 and found to be unsatisfactory due to inflammation. Patient returns today for vaginal swab to see if infection may be causing this so she can be treated appropriately and pap can then be repeated.   Allergies  Allergen Reactions  . Plaquenil [Hydroxychloroquine] Rash     Current Outpatient Medications:  .  acyclovir cream (ZOVIRAX) 5 %, APPLY TOPICALLY EVERY 4 HOURS, Disp: 15 g, Rfl: 0 .  ANORO ELLIPTA 62.5-25 MCG/INH AEPB, , Disp: , Rfl:  .  ARIPiprazole (ABILIFY) 10 MG tablet, Take 1 tablet by mouth once daily, Disp: 90 tablet, Rfl: 1 .  atorvastatin (LIPITOR) 40 MG tablet, Take 1 tablet by mouth once daily, Disp: 90 tablet, Rfl: 0 .  b complex vitamins tablet, Take 1 tablet by mouth daily. B12, Disp: , Rfl:  .  busPIRone (BUSPAR) 30 MG tablet, TAKE 1 TABLET BY MOUTH THREE TIMES DAILY, Disp: 90 tablet, Rfl: 5 .  diclofenac sodium (VOLTAREN) 1 % GEL, Apply topically 4 (four) times daily., Disp: , Rfl:  .  diltiazem (CARDIZEM CD) 180 MG 24 hr capsule, Take 1 capsule (180 mg total) by mouth daily., Disp: 90 capsule, Rfl: 3 .  Diphenhyd-Hydrocort-Nystatin (FIRST-DUKES MOUTHWASH) SUSP, Take 5 mLs by mouth 3 (three) times daily. Swish and spit, Disp: 240 mL, Rfl: 0 .  ezetimibe (ZETIA) 10 MG tablet, Take 1 tablet (10 mg total) by mouth daily., Disp: 90 tablet, Rfl: 3 .  fluticasone (FLONASE) 50 MCG/ACT nasal spray, Place 2 sprays into both nostrils daily., Disp: 16 g, Rfl: 6 .  furosemide (LASIX) 20 MG tablet, TAKE 1 TABLET BY MOUTH DAILY AS NEEDED FOR  FLUID, Disp: 90 tablet, Rfl: 1 .  gabapentin (NEURONTIN) 600 MG tablet, 600 mg 3 times daily.  Okay to increase nighttime dose to 1200 mg., Disp: 120 tablet, Rfl: 5 .  ibuprofen (ADVIL,MOTRIN) 200 MG  tablet, Take 200 mg by mouth every 6 (six) hours as needed. For pain, Disp: , Rfl:  .  ipratropium (ATROVENT) 0.02 % nebulizer solution, Take 2.5 mLs (0.5 mg total) by nebulization 4 (four) times daily., Disp: 75 mL, Rfl: 12 .  leflunomide (ARAVA) 10 MG tablet, Take 10 mg by mouth daily., Disp: , Rfl:  .  linaclotide (LINZESS) 72 MCG capsule, Take by mouth., Disp: , Rfl:  .  Multiple Vitamin (MULTIVITAMIN) tablet, Take 1 tablet by mouth daily.  , Disp: , Rfl:  .  Nutritional Supplements (ESTROVEN ENERGY PO), Take by mouth daily., Disp: , Rfl:  .  omeprazole (PRILOSEC) 20 MG capsule, Take 20 mg by mouth daily., Disp: , Rfl:  .  pantoprazole (PROTONIX) 40 MG tablet, Take by mouth., Disp: , Rfl:  .  potassium chloride SA (K-DUR) 20 MEQ tablet, TAKE 1 TABLET BY MOUTH DAILY. TAKE WITH FUROSEMIDE, Disp: 30 tablet, Rfl: 0 .  sertraline (ZOLOFT) 100 MG tablet, Take 1 tablet (100 mg total) by mouth daily., Disp: 90 tablet, Rfl: 1 .  topiramate (TOPAMAX) 25 MG tablet, Take 1 tablet (25 mg total) by mouth 2 (two) times daily., Disp: 60 tablet, Rfl: 5 .  traZODone (DESYREL) 100 MG tablet, Take 2 tablets (200 mg total) by mouth at bedtime., Disp: 180 tablet, Rfl:  1 .  valACYclovir (VALTREX) 1000 MG tablet, TAKE 2 TABLETS BY MOUTH TWICE DAILY FOR 5 DAYS AS NEEDED, Disp: 30 tablet, Rfl: 11 .  venlafaxine XR (EFFEXOR-XR) 75 MG 24 hr capsule, TAKE 3 CAPSULES BY MOUTH ONCE DAILY WITH BREAKFAST, Disp: 270 capsule, Rfl: 1 .  XIIDRA 5 % SOLN, Place 1 drop into both eyes daily., Disp: , Rfl:   Review of Systems  Constitutional: Negative for appetite change, chills, fatigue and fever.  Respiratory: Negative for chest tightness and shortness of breath.   Cardiovascular: Negative for chest pain and palpitations.  Gastrointestinal: Negative for abdominal pain, nausea and vomiting.  Neurological: Negative for dizziness and weakness.    Social History   Tobacco Use  . Smoking status: Current Every Day Smoker     Packs/day: 1.00    Years: 40.00    Pack years: 40.00  . Smokeless tobacco: Never Used  Substance Use Topics  . Alcohol use: No    Comment: recovering alcoholic Since [id number]       Objective:   There were no vitals taken for this visit. There were no vitals filed for this visit.There is no height or weight on file to calculate BMI.   Physical Exam Vitals signs reviewed.  Neurological:     Mental Status: She is alert.     No results found for any visits on 04/21/19.     Assessment & Plan    1. Vaginal discharge Swab collected today by patient. Patient not seen by provider, but I was available if issues arose. I will f/u pending results.  - Cervicovaginal ancillary only     [name], PA-C  [facility]

## 2019-04-21 ENCOUNTER — Other Ambulatory Visit: Payer: Self-pay

## 2019-04-21 ENCOUNTER — Ambulatory Visit (INDEPENDENT_AMBULATORY_CARE_PROVIDER_SITE_OTHER): Payer: Medicare Other | Admitting: Physician Assistant

## 2019-04-21 ENCOUNTER — Other Ambulatory Visit (HOSPITAL_COMMUNITY)
Admission: RE | Admit: 2019-04-21 | Discharge: 2019-04-21 | Disposition: A | Discharge status: Home / Self Care | Payer: PPO | Source: Ambulatory Visit | Attending: Physician Assistant | Admitting: Physician Assistant

## 2019-04-21 DIAGNOSIS — N898 Other specified noninflammatory disorders of vagina: Secondary | ICD-10-CM | POA: Insufficient documentation

## 2019-04-24 ENCOUNTER — Other Ambulatory Visit: Payer: Self-pay | Admitting: Physician Assistant

## 2019-04-24 DIAGNOSIS — R339 Retention of urine, unspecified: Secondary | ICD-10-CM

## 2019-04-24 DIAGNOSIS — F411 Generalized anxiety disorder: Secondary | ICD-10-CM

## 2019-04-26 LAB — CERVICOVAGINAL ANCILLARY ONLY
Bacterial vaginitis: NEGATIVE
Candida vaginitis: NEGATIVE
Chlamydia: NEGATIVE
Neisseria Gonorrhea: NEGATIVE
Trichomonas: NEGATIVE

## 2019-04-27 ENCOUNTER — Telehealth: Payer: Self-pay

## 2019-04-27 NOTE — Telephone Encounter (Signed)
-----   Message from [name], PA-C sent at 04/27/2019  8:42 AM EDT ----- Vaginal swab was negative. If not having any vaginal symptoms, discharge, etc we can repeat the pap in 2-4 weeks. Unsure of cause of inflammation. May have been an infection that your body has cleared on its own. This is common for the body to clear them.

## 2019-04-27 NOTE — Telephone Encounter (Signed)
Patient advised as directed below. 

## 2019-05-05 ENCOUNTER — Encounter: Payer: Self-pay | Admitting: Physician Assistant

## 2019-05-05 DIAGNOSIS — K148 Other diseases of tongue: Secondary | ICD-10-CM

## 2019-05-18 ENCOUNTER — Encounter: Payer: Self-pay | Admitting: Physician Assistant

## 2019-05-18 ENCOUNTER — Encounter: Payer: Self-pay | Admitting: Student in an Organized Health Care Education/Training Program

## 2019-05-18 ENCOUNTER — Telehealth: Payer: Self-pay | Admitting: Student in an Organized Health Care Education/Training Program

## 2019-05-18 ENCOUNTER — Other Ambulatory Visit: Payer: Self-pay

## 2019-05-18 ENCOUNTER — Ambulatory Visit
Payer: PPO | Attending: Student in an Organized Health Care Education/Training Program | Admitting: Student in an Organized Health Care Education/Training Program

## 2019-05-18 DIAGNOSIS — G8929 Other chronic pain: Secondary | ICD-10-CM | POA: Diagnosis not present

## 2019-05-18 DIAGNOSIS — J0140 Acute pansinusitis, unspecified: Secondary | ICD-10-CM

## 2019-05-18 DIAGNOSIS — M5136 Other intervertebral disc degeneration, lumbar region: Secondary | ICD-10-CM

## 2019-05-18 DIAGNOSIS — M48062 Spinal stenosis, lumbar region with neurogenic claudication: Secondary | ICD-10-CM | POA: Diagnosis not present

## 2019-05-18 DIAGNOSIS — M5416 Radiculopathy, lumbar region: Secondary | ICD-10-CM | POA: Diagnosis not present

## 2019-05-18 DIAGNOSIS — G894 Chronic pain syndrome: Secondary | ICD-10-CM | POA: Diagnosis not present

## 2019-05-18 MED ORDER — AMOXICILLIN-POT CLAVULANATE 875-125 MG PO TABS: 1.0000 | ORAL_TABLET | Freq: Two times a day (BID) | ORAL | 0 refills | Status: DC

## 2019-05-18 MED ORDER — PREDNISONE 20 MG PO TABS: ORAL_TABLET | ORAL | 0 refills | Status: AC

## 2019-05-18 NOTE — Progress Notes (Signed)
Patient: Gabrielle Pineda Female    DOB: 1955-11-16   63 y.o.   MRN: [id number] Visit Date: 05/19/2019  Today's Provider: [name], PA-C   Chief Complaint  Patient presents with  . Follow-up   Subjective:     HPI   Patient is here for repeat pap.   Allergies  Allergen Reactions  . Plaquenil [Hydroxychloroquine] Rash     Current Outpatient Medications:  .  acyclovir cream (ZOVIRAX) 5 %, APPLY TOPICALLY EVERY 4 HOURS, Disp: 15 g, Rfl: 0 .  amoxicillin-clavulanate (AUGMENTIN) 875-125 MG tablet, Take 1 tablet by mouth 2 (two) times daily., Disp: 20 tablet, Rfl: 0 .  ANORO ELLIPTA 62.5-25 MCG/INH AEPB, , Disp: , Rfl:  .  ARIPiprazole (ABILIFY) 10 MG tablet, Take 1 tablet by mouth once daily, Disp: 90 tablet, Rfl: 1 .  atorvastatin (LIPITOR) 40 MG tablet, Take 1 tablet by mouth once daily, Disp: 90 tablet, Rfl: 0 .  b complex vitamins tablet, Take 1 tablet by mouth daily. B12, Disp: , Rfl:  .  busPIRone (BUSPAR) 30 MG tablet, TAKE 1 TABLET BY MOUTH THREE TIMES DAILY, Disp: 90 tablet, Rfl: 5 .  diclofenac sodium (VOLTAREN) 1 % GEL, Apply topically 4 (four) times daily., Disp: , Rfl:  .  diltiazem (CARDIZEM CD) 180 MG 24 hr capsule, Take 1 capsule (180 mg total) by mouth daily., Disp: 90 capsule, Rfl: 3 .  Diphenhyd-Hydrocort-Nystatin (FIRST-DUKES MOUTHWASH) SUSP, Take 5 mLs by mouth 3 (three) times daily. Swish and spit, Disp: 240 mL, Rfl: 0 .  ezetimibe (ZETIA) 10 MG tablet, Take 1 tablet (10 mg total) by mouth daily., Disp: 90 tablet, Rfl: 3 .  fluticasone (FLONASE) 50 MCG/ACT nasal spray, Place 2 sprays into both nostrils daily., Disp: 16 g, Rfl: 6 .  furosemide (LASIX) 20 MG tablet, TAKE 1 TABLET BY MOUTH DAILY AS NEEDED FOR  FLUID, Disp: 90 tablet, Rfl: 1 .  gabapentin (NEURONTIN) 600 MG tablet, 600 mg 3 times daily.  Okay to increase nighttime dose to 1200 mg., Disp: 120 tablet, Rfl: 5 .  ibuprofen (ADVIL,MOTRIN) 200 MG tablet, Take 200 mg by mouth every 6  (six) hours as needed. For pain, Disp: , Rfl:  .  ipratropium (ATROVENT) 0.02 % nebulizer solution, Take 2.5 mLs (0.5 mg total) by nebulization 4 (four) times daily., Disp: 75 mL, Rfl: 12 .  leflunomide (ARAVA) 10 MG tablet, Take 10 mg by mouth daily., Disp: , Rfl:  .  Multiple Vitamin (MULTIVITAMIN) tablet, Take 1 tablet by mouth daily.  , Disp: , Rfl:  .  Nutritional Supplements (ESTROVEN ENERGY PO), Take by mouth daily., Disp: , Rfl:  .  omeprazole (PRILOSEC) 20 MG capsule, Take 20 mg by mouth daily., Disp: , Rfl:  .  pantoprazole (PROTONIX) 40 MG tablet, Take by mouth., Disp: , Rfl:  .  potassium chloride SA (K-DUR) 20 MEQ tablet, TAKE 1  BY MOUTH ONCE DAILY TAKE  WITH  FUROSEMIDE, Disp: 30 tablet, Rfl: 5 .  predniSONE (DELTASONE) 20 MG tablet, Take 3 tablets (60 mg total) by mouth daily with breakfast for 3 days, THEN 2 tablets (40 mg total) daily with breakfast for 3 days, THEN 1 tablet (20 mg total) daily with breakfast for 3 days., Disp: 18 tablet, Rfl: 0 .  sertraline (ZOLOFT) 100 MG tablet, Take 1 tablet (100 mg total) by mouth daily., Disp: 90 tablet, Rfl: 1 .  topiramate (TOPAMAX) 25 MG tablet, Take 1 tablet (25 mg total)  by mouth 2 (two) times daily., Disp: 60 tablet, Rfl: 5 .  traZODone (DESYREL) 100 MG tablet, Take 2 tablets (200 mg total) by mouth at bedtime., Disp: 180 tablet, Rfl: 1 .  valACYclovir (VALTREX) 1000 MG tablet, TAKE 2 TABLETS BY MOUTH TWICE DAILY FOR 5 DAYS AS NEEDED, Disp: 30 tablet, Rfl: 11 .  venlafaxine XR (EFFEXOR-XR) 75 MG 24 hr capsule, TAKE 3 CAPSULES BY MOUTH ONCE DAILY WITH BREAKFAST, Disp: 270 capsule, Rfl: 1 .  XIIDRA 5 % SOLN, Place 1 drop into both eyes daily., Disp: , Rfl:  .  linaclotide (LINZESS) 72 MCG capsule, Take by mouth., Disp: , Rfl:   Review of Systems  Constitutional: Negative for appetite change, chills, fatigue and fever.  Respiratory: Negative for chest tightness and shortness of breath.   Cardiovascular: Negative for chest pain and  palpitations.  Gastrointestinal: Negative for abdominal pain, nausea and vomiting.  Neurological: Negative for dizziness and weakness.    Social History   Tobacco Use  . Smoking status: Current Every Day Smoker    Packs/day: 1.00    Years: 40.00    Pack years: 40.00  . Smokeless tobacco: Never Used  Substance Use Topics  . Alcohol use: No    Comment: recovering alcoholic Since [id number]       Objective:   BP 118/75 (BP Location: Left Arm, Patient Position: Sitting, Cuff Size: Normal)   Pulse 79   Temp (!) 96.9 F (36.1 C) (Other (Comment))   Resp 16   Ht 5\' 9"  (1.753 m)   Wt 149 lb (67.6 kg)   SpO2 98%   BMI 22.00 kg/m  Vitals:   05/19/19 0842  BP: 118/75  Pulse: 79  Resp: 16  Temp: (!) 96.9 F (36.1 C)  TempSrc: Other (Comment)  SpO2: 98%  Weight: 149 lb (67.6 kg)  Height: 5\' 9"  (1.753 m)  Body mass index is 22 kg/m.   Physical Exam   No results found for any visits on 05/19/19.     Assessment & [place], PA-C  [facility]

## 2019-05-18 NOTE — Progress Notes (Signed)
Pain Management Virtual Encounter Note - Virtual Visit via Telephone Telehealth (real-time audio visits between healthcare provider and patient).   Patient's Phone No. & Preferred Pharmacy:  [phone] (home); [phone] (mobile); (Preferred) [phone] [email or web address]  [facility] (N), [place] [address]) [place] [address] Phone: [phone] Fax: [phone]    Pre-screening note:  Our staff contacted Gabrielle Pineda and offered her an "in person", "face-to-face" appointment versus a telephone encounter. She indicated preferring the telephone encounter, at this time.   Reason for Virtual Visit: COVID-19*  Social distancing based on CDC and AMA recommendations.   I contacted KIKO FICO on 05/18/2019 via telephone.      I clearly identified myself as [name], MD. I verified that I was speaking with the correct person using two identifiers (Name: Gabrielle Pineda, and date of birth: 08-Sep-1955).  Advanced Informed Consent I sought verbal advanced consent from Gabrielle Pineda for virtual visit interactions. I informed Gabrielle Pineda of possible security and privacy concerns, risks, and limitations associated with providing "not-in-person" medical evaluation and management services. I also informed Gabrielle Pineda of the availability of "in-person" appointments. Finally, I informed her that there would be a charge for the virtual visit and that she could be  personally, fully or partially, financially responsible for it. Gabrielle Pineda expressed understanding and agreed to proceed.   Historic Elements   Gabrielle Pineda is a 63 y.o. year old, female patient evaluated today after her last encounter by our practice on 05/18/2019. Ms. Breyer  has a past medical history of Alcohol abuse, Anemia, Arthritis, Cervicalgia, COPD (chronic obstructive pulmonary disease) ([place]), Depression, GERD  (gastroesophageal reflux disease), Heart murmur, Other and unspecified hyperlipidemia, and Tachycardia. She also  has a past surgical history that includes neck disc surgery; Breast enhancement surgery (1981); Esophagogastroduodenoscopy; Tonsillectomy and adenoidectomy (1973 ); Carpal tunnel release (11/11/2011); Carpal tunnel release (2013); Rotator cuff repair (06/16/2016); Rotator cuff repair; Colonoscopy with propofol (N/A, 11/01/2017); and Augmentation mammaplasty (Bilateral, 1982). Gabrielle Pineda has a current medication list which includes the following prescription(s): acyclovir cream, amoxicillin-clavulanate, anoro ellipta, aripiprazole, atorvastatin, b complex vitamins, buspirone, diclofenac sodium, diltiazem, first-dukes mouthwash, ezetimibe, fluticasone, furosemide, gabapentin, ibuprofen, ipratropium, leflunomide, linaclotide, multivitamin, misc natural products, omeprazole, pantoprazole, potassium chloride sa, prednisone, sertraline, topiramate, trazodone, valacyclovir, venlafaxine xr, and xiidra. She  reports that she has been smoking. She has a 40.00 pack-year smoking history. She has never used smokeless tobacco. She reports that she does not drink alcohol or use drugs. Gabrielle Pineda is allergic to plaquenil [hydroxychloroquine].   HPI  Today, she is being contacted for follow-up evaluation  Follow-up today to review lumbar MRI.  Results are below.  She states that she is having more difficulty with her low back pain.  We have tried lumbar epidural steroid injection which was not helpful.  We have also briefly discussed spinal cord stimulator trial but given her lumbar MRI and significant L4-L5 thecal sac impingement, I would like for her to consider evaluation with neurosurgery to determine if surgical decompression is warranted in her case.  Patient is in agreement with this plan.  We will also prescribe steroid taper as below.   UDS:  Summary  Date Value Ref Range Status  09/07/2017  FINAL  Final    Comment:    ==================================================================== [facility] COMP DRUG ANALYSIS,UR ==================================================================== Specimen Alert Note:  Urinary creatinine is low; ability to detect some drugs may be compromised.  Interpret results  with caution. ==================================================================== Test                             Result       Flag       Units Drug Present and Declared for Prescription Verification   Oxycodone                      493          EXPECTED   ng/mg creat   Oxymorphone                    433          EXPECTED   ng/mg creat   Noroxycodone                   3433         EXPECTED   ng/mg creat    Sources of oxycodone include scheduled prescription medications.    Oxymorphone and noroxycodone are expected metabolites of    oxycodone. Oxymorphone is also available as a scheduled    prescription medication.   Gabapentin                     PRESENT      EXPECTED   Desmethylcyclobenzaprine       PRESENT      EXPECTED    Desmethylcyclobenzaprine is an expected metabolite of    cyclobenzaprine.   Trazodone                      PRESENT      EXPECTED   1,3 chlorophenyl piperazine    PRESENT      EXPECTED    1,3-chlorophenyl piperazine is an expected metabolite of    trazodone.   Venlafaxine                    PRESENT      EXPECTED   Desmethylvenlafaxine           PRESENT      EXPECTED    Desmethylvenlafaxine is an expected metabolite of venlafaxine. Drug Present not Declared for Prescription Verification   Acetaminophen                  PRESENT      UNEXPECTED   Naproxen                       PRESENT      UNEXPECTED   Diphenhydramine                PRESENT      UNEXPECTED   Doxylamine                     PRESENT      UNEXPECTED   Dextromethorphan               PRESENT      UNEXPECTED   Dextrorphan/Levorphanol        PRESENT      UNEXPECTED    Dextrorphan is an  expected metabolite of dextromethorphan, an    over-the-counter or prescription cough suppressant. Dextrorphan    cannot be distinguished from the scheduled prescription    medication levorphanol by the method used for analysis. Drug Absent but Declared for Prescription Verification   Sertraline  Not Detected UNEXPECTED   Ibuprofen                      Not Detected UNEXPECTED    Ibuprofen, as indicated in the declared medication list, is not    always detected even when used as directed.   Guaifenesin                    Not Detected UNEXPECTED ==================================================================== Test                      Result    Flag   Units      Ref Range   Creatinine              15        L      mg/dL      >=20 ==================================================================== Declared Medications:  The flagging and interpretation on this report are based on the  following declared medications.  Unexpected results may arise from  inaccuracies in the declared medications.  **Note: The testing scope of this panel includes these medications:  Cyclobenzaprine (Flexeril)  Gabapentin  Guaifenesin (Mucinex)  Oxycodone  Sertraline  Trazodone  Venlafaxine  **Note: The testing scope of this panel does not include small to  moderate amounts of these reported medications:  Ibuprofen  **Note: The testing scope of this panel does not include following  reported medications:  Albuterol (Proventil)  Amoxicillin (Augmentin)  Atorvastatin (Lipitor)  Clavulinate (Augmentin)  Folic acid  Meloxicam  Methotrexate  Multivitamin  Omeprazole (Prilosec)  Valacyclovir (Valtrex)  Varenicline (Chantix)  Vitamin B ==================================================================== For clinical consultation, please call [phone]. ====================================================================    Laboratory Chemistry Profile (12 mo)  Renal:  04/17/2019: BUN 14; BUN/Creatinine Ratio 16; Creatinine, Ser 0.88  Lab Results  Component Value Date   GFRAA 81 04/17/2019   GFRNONAA 71 04/17/2019   Hepatic: 04/17/2019: Albumin 4.3 Lab Results  Component Value Date   AST 19 04/17/2019   ALT 17 04/17/2019   Other: No results found for requested labs within last 8760 hours. Note: Above Lab results reviewed.  Imaging  Last 90 days:  Mr Lumbar Spine Wo Contrast  Result Date: 04/15/2019 CLINICAL DATA:  Low back pain radiating into both hips for the last 3 months. EXAM: MRI LUMBAR SPINE WITHOUT CONTRAST TECHNIQUE: Multiplanar, multisequence MR imaging of the lumbar spine was performed. No intravenous contrast was administered. COMPARISON:  Multiple exams, including 10/22/2016 lumbar myelogram FINDINGS: Segmentation: The lowest lumbar type non-rib-bearing vertebra is labeled as L5. Alignment:  No vertebral subluxation is observed. Vertebrae: Type 1 degenerative endplate findings at 075-GRM. Mild degenerative facet edema bilaterally at L4-5. Disc desiccation at all levels between L2 and S1. Loss of intervertebral disc height most striking at L5-S1. Conus medullaris and cauda equina: Conus extends to the lower L2 level, mildly abnormally low. No tethering mass is identified. The cauda equina appears unremarkable. Paraspinal and other soft tissues: Unremarkable Disc levels: T12-L1: No impingement.  Mild disc bulge. L1-2: No impingement.  Mild disc bulge. L2-3: No impingement.  Mild disc bulge. L3-4: Mild right subarticular lateral recess stenosis and borderline central narrowing of the thecal sac due to a disc bulge. Central annular tear noted. L4-5: Prominent central narrowing of the thecal sac and moderate bilateral subarticular lateral recess stenosis is due to disc bulge, short pedicles, facet arthropathy, ligamentum flavum redundancy. AP diameter of the thecal sac is 0.5 cm. Impingement at this level is significantly  worsened from 10/22/2016. L5-S1:  Moderate bilateral foraminal stenosis with moderate right and mild left subarticular lateral recess stenosis and mild central narrowing of the thecal sac due to diffuse disc bulge, facet arthropathy, and ligamentum flavum redundancy. Impingement at this level is mildly worsened compared to 2018. IMPRESSION: 1. Lumbar spondylosis and degenerative disc disease causing prominent impingement at L4-5; moderate impingement at L5-S1; and mild impingement at L3-4, as detailed above. 2. Low lying conus at the lower L2 level, without tethering mass identified. Electronically Signed   By: Van Clines M.D.   On: 04/15/2019 11:38   Mm 3d Screen Breast W/implant Bilateral  Result Date: 03/27/2019 CLINICAL DATA:  Screening. EXAM: DIGITAL SCREENING BILATERAL MAMMOGRAM WITH IMPLANTS, CAD AND TOMO The patient has retropectoral implants. Standard and implant displaced views were performed. COMPARISON:  Previous exam(s). ACR Breast Density Category b: There are scattered areas of fibroglandular density. FINDINGS: There are no findings suspicious for malignancy. Images were processed with CAD. IMPRESSION: No mammographic evidence of malignancy. A result letter of this screening mammogram will be mailed directly to the patient. RECOMMENDATION: Screening mammogram in one year. (Code:SM-B-01Y) BI-RADS CATEGORY  1:  Negative. Electronically Signed   By: Lillia Mountain M.D.   On: 03/27/2019 16:34    Assessment  The primary encounter diagnosis was Lumbar radiculopathy (R>L). Diagnoses of Chronic radicular lumbar pain, Lumbar degenerative disc disease, Spinal stenosis, lumbar region, with neurogenic claudication, and Chronic pain syndrome were also pertinent to this visit.  Plan of Care  I am having Gabrielle Pineda start on predniSONE. I am also having her maintain her ibuprofen, b complex vitamins, multivitamin, omeprazole, Nutritional Supplements (ESTROVEN ENERGY PO), diclofenac sodium, leflunomide, Xiidra, diltiazem,  ezetimibe, Anoro Ellipta, topiramate, gabapentin, fluticasone, pantoprazole, linaclotide, furosemide, venlafaxine XR, sertraline, acyclovir cream, atorvastatin, valACYclovir, ARIPiprazole, traZODone, ipratropium, First-Dukes Mouthwash, busPIRone, and potassium chloride SA.  Given her lumbar MRI and significant L4-L5 thecal sac impingement, I would like for her to consider evaluation with neurosurgery to determine if surgical decompression is warranted in her case.  Patient is in agreement with this plan.  Patient has tried and failed conservative management with gabapentin, Lyrica, TCAs, physical therapy, TENS unit, epidural steroid injection.  We will also prescribe steroid taper as below.   Pharmacotherapy (Medications Ordered): Meds ordered this encounter  Medications  . predniSONE (DELTASONE) 20 MG tablet    Sig: Take 3 tablets (60 mg total) by mouth daily with breakfast for 3 days, THEN 2 tablets (40 mg total) daily with breakfast for 3 days, THEN 1 tablet (20 mg total) daily with breakfast for 3 days.    Dispense:  18 tablet    Refill:  0   Orders:  Orders Placed This Encounter  Procedures  . Ambulatory referral to Neurosurgery    Referral Priority:   Routine    Referral Type:   Surgical    Referral Reason:   Specialty Services Required    Referred to Provider:   Newman Pies, MD    Requested Specialty:   Neurosurgery    Number of Visits Requested:   1   Follow-up plan:   Return if symptoms worsen or fail to improve.    Recent Visits Date Type Provider Dept  02/23/19 Office Visit [name], MD [facility]  Showing recent visits within past 90 days and meeting all other requirements   Today's Visits Date Type Provider Dept  05/18/19 Office Visit [name], MD [facility]  Showing today's visits and meeting all  other requirements   Future Appointments No visits were found meeting these conditions.  Showing future appointments within next 90  days and meeting all other requirements   I discussed the assessment and treatment plan with the patient. The patient was provided an opportunity to ask questions and all were answered. The patient agreed with the plan and demonstrated an understanding of the instructions.  Patient advised to call back or seek an in-person evaluation if the symptoms or condition worsens.  Total duration of non-face-to-face encounter: 22 minutes.  Note by: [name], MD Date: 05/18/2019; Time: 2:30 PM  Note: This dictation was prepared with Dragon dictation. Any transcriptional errors that may result from this process are unintentional.  Disclaimer:  * Given the special circumstances of the COVID-19 pandemic, the federal government has announced that the Office for Civil Rights (OCR) will exercise its enforcement discretion and will not impose penalties on physicians using telehealth in the event of noncompliance with regulatory requirements under the [place] (HIPAA) in connection with the good faith provision of telehealth during the XX123456 national public health emergency. ([place])

## 2019-05-18 NOTE — Telephone Encounter (Signed)
Patient returned Nurse Call / lvmail at 3:[phone]

## 2019-05-18 NOTE — Progress Notes (Signed)
Patient: Gabrielle Pineda Female    DOB: [birth year]   63 y.o.   MRN: [id number] Visit Date: 05/19/2019  Today's Provider: [name], PA-C   Chief Complaint  Patient presents with  . Follow-up   Subjective:     HPI  Patient is here for repeat pap. Most recent pap was unable to read due to inflammatory changes.   Allergies  Allergen Reactions  . Plaquenil [Hydroxychloroquine] Rash     Current Outpatient Medications:  .  acyclovir cream (ZOVIRAX) 5 %, APPLY TOPICALLY EVERY 4 HOURS, Disp: 15 g, Rfl: 0 .  amoxicillin-clavulanate (AUGMENTIN) 875-125 MG tablet, Take 1 tablet by mouth 2 (two) times daily., Disp: 20 tablet, Rfl: 0 .  ANORO ELLIPTA 62.5-25 MCG/INH AEPB, , Disp: , Rfl:  .  ARIPiprazole (ABILIFY) 10 MG tablet, Take 1 tablet by mouth once daily, Disp: 90 tablet, Rfl: 1 .  atorvastatin (LIPITOR) 40 MG tablet, Take 1 tablet by mouth once daily, Disp: 90 tablet, Rfl: 0 .  b complex vitamins tablet, Take 1 tablet by mouth daily. B12, Disp: , Rfl:  .  busPIRone (BUSPAR) 30 MG tablet, TAKE 1 TABLET BY MOUTH THREE TIMES DAILY, Disp: 90 tablet, Rfl: 5 .  diclofenac sodium (VOLTAREN) 1 % GEL, Apply topically 4 (four) times daily., Disp: , Rfl:  .  diltiazem (CARDIZEM CD) 180 MG 24 hr capsule, Take 1 capsule (180 mg total) by mouth daily., Disp: 90 capsule, Rfl: 3 .  Diphenhyd-Hydrocort-Nystatin (FIRST-DUKES MOUTHWASH) SUSP, Take 5 mLs by mouth 3 (three) times daily. Swish and spit, Disp: 240 mL, Rfl: 0 .  ezetimibe (ZETIA) 10 MG tablet, Take 1 tablet (10 mg total) by mouth daily., Disp: 90 tablet, Rfl: 3 .  fluticasone (FLONASE) 50 MCG/ACT nasal spray, Place 2 sprays into both nostrils daily., Disp: 16 g, Rfl: 6 .  furosemide (LASIX) 20 MG tablet, TAKE 1 TABLET BY MOUTH DAILY AS NEEDED FOR  FLUID, Disp: 90 tablet, Rfl: 1 .  gabapentin (NEURONTIN) 600 MG tablet, 600 mg 3 times daily.  Okay to increase nighttime dose to 1200 mg., Disp: 120 tablet, Rfl: 5 .  ibuprofen  (ADVIL,MOTRIN) 200 MG tablet, Take 200 mg by mouth every 6 (six) hours as needed. For pain, Disp: , Rfl:  .  ipratropium (ATROVENT) 0.02 % nebulizer solution, Take 2.5 mLs (0.5 mg total) by nebulization 4 (four) times daily., Disp: 75 mL, Rfl: 12 .  leflunomide (ARAVA) 10 MG tablet, Take 10 mg by mouth daily., Disp: , Rfl:  .  Multiple Vitamin (MULTIVITAMIN) tablet, Take 1 tablet by mouth daily.  , Disp: , Rfl:  .  Nutritional Supplements (ESTROVEN ENERGY PO), Take by mouth daily., Disp: , Rfl:  .  omeprazole (PRILOSEC) 20 MG capsule, Take 20 mg by mouth daily., Disp: , Rfl:  .  pantoprazole (PROTONIX) 40 MG tablet, Take by mouth., Disp: , Rfl:  .  potassium chloride SA (K-DUR) 20 MEQ tablet, TAKE 1  BY MOUTH ONCE DAILY TAKE  WITH  FUROSEMIDE, Disp: 30 tablet, Rfl: 5 .  predniSONE (DELTASONE) 20 MG tablet, Take 3 tablets (60 mg total) by mouth daily with breakfast for 3 days, THEN 2 tablets (40 mg total) daily with breakfast for 3 days, THEN 1 tablet (20 mg total) daily with breakfast for 3 days., Disp: 18 tablet, Rfl: 0 .  sertraline (ZOLOFT) 100 MG tablet, Take 1 tablet (100 mg total) by mouth daily., Disp: 90 tablet, Rfl: 1 .  topiramate (  TOPAMAX) 25 MG tablet, Take 1 tablet (25 mg total) by mouth 2 (two) times daily., Disp: 60 tablet, Rfl: 5 .  traZODone (DESYREL) 100 MG tablet, Take 2 tablets (200 mg total) by mouth at bedtime., Disp: 180 tablet, Rfl: 1 .  valACYclovir (VALTREX) 1000 MG tablet, TAKE 2 TABLETS BY MOUTH TWICE DAILY FOR 5 DAYS AS NEEDED, Disp: 30 tablet, Rfl: 11 .  venlafaxine XR (EFFEXOR-XR) 75 MG 24 hr capsule, TAKE 3 CAPSULES BY MOUTH ONCE DAILY WITH BREAKFAST, Disp: 270 capsule, Rfl: 1 .  XIIDRA 5 % SOLN, Place 1 drop into both eyes daily., Disp: , Rfl:  .  linaclotide (LINZESS) 72 MCG capsule, Take by mouth., Disp: , Rfl:   Review of Systems  Constitutional: Negative.   Respiratory: Negative.   Cardiovascular: Negative.   Gastrointestinal: Negative.   Genitourinary:  Negative for dysuria, genital sores, menstrual problem, pelvic pain, vaginal bleeding, vaginal discharge and vaginal pain.    Social History   Tobacco Use  . Smoking status: Current Every Day Smoker    Packs/day: 1.00    Years: 40.00    Pack years: 40.00  . Smokeless tobacco: Never Used  Substance Use Topics  . Alcohol use: No    Comment: recovering alcoholic Since [id number]       Objective:   BP 118/75 (BP Location: Left Arm, Patient Position: Sitting, Cuff Size: Normal)   Pulse 79   Temp (!) 96.9 F (36.1 C) (Other (Comment))   Resp 16   Ht 5\' 9"  (1.753 m)   Wt 149 lb (67.6 kg)   SpO2 98%   BMI 22.00 kg/m  Vitals:   05/19/19 0842  BP: 118/75  Pulse: 79  Resp: 16  Temp: (!) 96.9 F (36.1 C)  TempSrc: Other (Comment)  SpO2: 98%  Weight: 149 lb (67.6 kg)  Height: 5\' 9"  (1.753 m)  Body mass index is 22 kg/m.   Physical Exam Vitals signs reviewed. Exam conducted with a chaperone present.  Constitutional:      General: She is not in acute distress.    Appearance: Normal appearance. She is not ill-appearing.  HENT:     Head: Normocephalic and atraumatic.  Eyes:     General: No scleral icterus.    Extraocular Movements: Extraocular movements intact.  Abdominal:     Hernia: There is no hernia in the left inguinal area or right inguinal area.  Genitourinary:    General: Normal vulva.     Exam position: Supine.     Pubic Area: No rash.      Labia:        Right: No rash, tenderness, lesion or injury.        Left: No rash, tenderness, lesion or injury.      Vagina: Normal. No vaginal discharge or tenderness.     Cervix: No cervical motion tenderness, discharge or erythema.     Uterus: Normal.      Adnexa: Right adnexa normal and left adnexa normal.  Lymphadenopathy:     Lower Body: No right inguinal adenopathy. No left inguinal adenopathy.  Neurological:     Mental Status: She is alert.      No results found for any visits on 05/19/19.     Assessment &  Palomas, PA-C  [facility]

## 2019-05-19 ENCOUNTER — Encounter: Payer: Self-pay | Admitting: Physician Assistant

## 2019-05-19 ENCOUNTER — Other Ambulatory Visit (HOSPITAL_COMMUNITY)
Admission: RE | Admit: 2019-05-19 | Discharge: 2019-05-19 | Disposition: A | Discharge status: Home / Self Care | Payer: PPO | Source: Ambulatory Visit | Attending: Physician Assistant | Admitting: Physician Assistant

## 2019-05-19 ENCOUNTER — Other Ambulatory Visit: Payer: Self-pay

## 2019-05-19 ENCOUNTER — Ambulatory Visit (INDEPENDENT_AMBULATORY_CARE_PROVIDER_SITE_OTHER): Payer: PPO | Admitting: Physician Assistant

## 2019-05-19 VITALS — BP 118/75 | HR 79 | Temp 96.9°F | Resp 16 | Ht 69.0 in | Wt 149.0 lb

## 2019-05-19 DIAGNOSIS — F17200 Nicotine dependence, unspecified, uncomplicated: Secondary | ICD-10-CM | POA: Diagnosis not present

## 2019-05-19 DIAGNOSIS — Z124 Encounter for screening for malignant neoplasm of cervix: Secondary | ICD-10-CM | POA: Diagnosis not present

## 2019-05-19 DIAGNOSIS — K120 Recurrent oral aphthae: Secondary | ICD-10-CM | POA: Diagnosis not present

## 2019-05-23 ENCOUNTER — Encounter: Payer: Self-pay | Admitting: Physician Assistant

## 2019-05-23 ENCOUNTER — Other Ambulatory Visit: Payer: Self-pay | Admitting: Student in an Organized Health Care Education/Training Program

## 2019-05-23 ENCOUNTER — Other Ambulatory Visit: Payer: Self-pay | Admitting: Physician Assistant

## 2019-05-23 DIAGNOSIS — K219 Gastro-esophageal reflux disease without esophagitis: Secondary | ICD-10-CM

## 2019-05-23 DIAGNOSIS — F339 Major depressive disorder, recurrent, unspecified: Secondary | ICD-10-CM

## 2019-05-23 MED ORDER — OMEPRAZOLE 40 MG PO CPDR: 40.0000 mg | DELAYED_RELEASE_CAPSULE | Freq: Two times a day (BID) | ORAL | 3 refills | Status: DC

## 2019-05-31 LAB — CYTOLOGY - PAP
Diagnosis: NEGATIVE
High risk HPV: NEGATIVE

## 2019-06-01 ENCOUNTER — Telehealth: Payer: Self-pay | Admitting: Student in an Organized Health Care Education/Training Program

## 2019-06-01 NOTE — Telephone Encounter (Signed)
Patient lvmail on 05-31-19 at 3:12 stating she did not get her Topiramate on her appt 05-18-19. Can this be sent to pharmacy ?

## 2019-06-01 NOTE — Telephone Encounter (Signed)
Topamax was last ordered 05-04-18 with 5 refills and was not reordered on 05-18-19 virtual visit.

## 2019-06-02 ENCOUNTER — Encounter: Payer: Self-pay | Admitting: Student in an Organized Health Care Education/Training Program

## 2019-06-05 MED ORDER — TOPIRAMATE 25 MG PO TABS: 25.0000 mg | ORAL_TABLET | Freq: Two times a day (BID) | ORAL | 5 refills | Status: DC

## 2019-06-05 NOTE — Addendum Note (Signed)
Addended by: [name] on: 06/05/2019 09:32 AM   Modules accepted: Orders

## 2019-06-07 DIAGNOSIS — M48062 Spinal stenosis, lumbar region with neurogenic claudication: Secondary | ICD-10-CM | POA: Diagnosis not present

## 2019-06-07 DIAGNOSIS — M5412 Radiculopathy, cervical region: Secondary | ICD-10-CM | POA: Diagnosis not present

## 2019-06-07 DIAGNOSIS — M5416 Radiculopathy, lumbar region: Secondary | ICD-10-CM | POA: Diagnosis not present

## 2019-06-07 DIAGNOSIS — M545 Low back pain: Secondary | ICD-10-CM | POA: Diagnosis not present

## 2019-06-08 ENCOUNTER — Telehealth: Payer: Self-pay | Admitting: Cardiovascular Disease

## 2019-06-08 NOTE — Telephone Encounter (Signed)
° °  [facility] Pre-operative Risk Assessment    Request for surgical clearance:  1. What type of surgery is being performed? L3 to S1 decompressive Lumbar Laminectomy    2. When is this surgery scheduled? TBD   3. What type of clearance is required (medical clearance vs. Pharmacy clearance to hold med vs. Both)? both  4. Are there any medications that need to be held prior to surgery and how long? None listed, please provider information if any medications will need to be held   5. Practice name and name of physician performing surgery? [facility], Dr Erline Levine  6. What is your office phone number [phone]    7.   What is your office fax number [phone]  8.   Anesthesia type (None, local, MAC, general) ? None listed    Gabrielle Pineda 06/08/2019, 4:11 PM  _________________________________________________________________   (provider comments below)

## 2019-06-09 DIAGNOSIS — F17200 Nicotine dependence, unspecified, uncomplicated: Secondary | ICD-10-CM | POA: Diagnosis not present

## 2019-06-09 DIAGNOSIS — K120 Recurrent oral aphthae: Secondary | ICD-10-CM | POA: Diagnosis not present

## 2019-06-09 NOTE — Telephone Encounter (Signed)
All assessments deferred until patient is evaluated.

## 2019-06-09 NOTE — Telephone Encounter (Signed)
   Primary Cardiologist:Timothy Rockey Situ, MD  Chart reviewed as part of pre-operative protocol coverage. Because of Gabrielle Pineda's past medical history and time since last visit, he/she will require a follow-up visit in order to better assess preoperative cardiovascular risk.  Pre-op covering staff: - Please schedule appointment and call patient to inform them. - Please contact requesting surgeon's office via preferred method (i.e, phone, fax) to inform them of need for appointment prior to surgery.  If applicable, this message will also be routed to pharmacy pool and/or primary cardiologist for input on holding anticoagulant/antiplatelet agent as requested below so that this information is available at time of patient's appointment.   Kathyrn Drown, NP  06/09/2019, 8:24 AM

## 2019-06-09 NOTE — Telephone Encounter (Signed)
Pt has been scheduled for 11/3 with Christell Faith, [facility] for surgery clearance. I will remove from the pre op call back pool. I will forward to Christell Faith, [facility] for appt.
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
## 2019-06-20 ENCOUNTER — Ambulatory Visit (INDEPENDENT_AMBULATORY_CARE_PROVIDER_SITE_OTHER): Payer: PPO | Admitting: Physician Assistant

## 2019-06-20 ENCOUNTER — Other Ambulatory Visit: Payer: Self-pay

## 2019-06-20 ENCOUNTER — Encounter: Payer: Self-pay | Admitting: Physician Assistant

## 2019-06-20 VITALS — BP 120/76 | HR 83 | Ht 69.0 in | Wt 149.8 lb

## 2019-06-20 DIAGNOSIS — I10 Essential (primary) hypertension: Secondary | ICD-10-CM | POA: Diagnosis not present

## 2019-06-20 DIAGNOSIS — J449 Chronic obstructive pulmonary disease, unspecified: Secondary | ICD-10-CM | POA: Diagnosis not present

## 2019-06-20 DIAGNOSIS — Z01810 Encounter for preprocedural cardiovascular examination: Secondary | ICD-10-CM

## 2019-06-20 DIAGNOSIS — E782 Mixed hyperlipidemia: Secondary | ICD-10-CM | POA: Diagnosis not present

## 2019-06-20 DIAGNOSIS — R002 Palpitations: Secondary | ICD-10-CM | POA: Diagnosis not present

## 2019-06-20 DIAGNOSIS — I471 Supraventricular tachycardia: Secondary | ICD-10-CM

## 2019-06-20 DIAGNOSIS — Z720 Tobacco use: Secondary | ICD-10-CM | POA: Diagnosis not present

## 2019-06-20 NOTE — Patient Instructions (Signed)
Medication Instructions:  Your physician recommends that you continue on your current medications as directed. Please refer to the Current Medication list given to you today.  *If you need a refill on your cardiac medications before your next appointment, please call your pharmacy*  Lab Work: None ordered  If you have labs (blood work) drawn today and your tests are completely normal, you will receive your results only by: Marland Kitchen MyChart Message (if you have MyChart) OR . A paper copy in the mail If you have any lab test that is abnormal or we need to change your treatment, we will call you to review the results.  Testing/Procedures: None ordered   Follow-Up: At [facility], you and your health needs are our priority.  As part of our continuing mission to provide you with exceptional heart care, we have created designated Provider Care Teams.  These Care Teams include your primary Cardiologist (physician) and Advanced Practice Providers (APPs -  Physician Assistants and Nurse Practitioners) who all work together to provide you with the care you need, when you need it.  Your next appointment:   6 months  The format for your next appointment:   In Person  Provider:    You may see Ida Rogue, MD or Christell Faith, PA-C.

## 2019-06-22 ENCOUNTER — Other Ambulatory Visit: Payer: Self-pay | Admitting: Cardiovascular Disease

## 2019-06-24 ENCOUNTER — Other Ambulatory Visit: Payer: Self-pay | Admitting: Physician Assistant

## 2019-06-24 DIAGNOSIS — F321 Major depressive disorder, single episode, moderate: Secondary | ICD-10-CM

## 2019-06-29 ENCOUNTER — Other Ambulatory Visit: Payer: Self-pay | Admitting: Neurosurgery

## 2019-07-07 DIAGNOSIS — K120 Recurrent oral aphthae: Secondary | ICD-10-CM | POA: Diagnosis not present

## 2019-07-07 DIAGNOSIS — K140 Glossitis: Secondary | ICD-10-CM | POA: Diagnosis not present

## 2019-07-12 ENCOUNTER — Encounter: Payer: Self-pay | Admitting: Physician Assistant

## 2019-07-12 DIAGNOSIS — J418 Mixed simple and mucopurulent chronic bronchitis: Secondary | ICD-10-CM

## 2019-07-12 MED ORDER — IPRATROPIUM BROMIDE 0.02 % IN SOLN: 0.5000 mg | Freq: Four times a day (QID) | RESPIRATORY_TRACT | 12 refills | Status: DC

## 2019-07-19 ENCOUNTER — Encounter: Payer: Self-pay | Admitting: Physician Assistant

## 2019-07-19 DIAGNOSIS — Z79899 Other long term (current) drug therapy: Secondary | ICD-10-CM | POA: Diagnosis not present

## 2019-07-19 DIAGNOSIS — J418 Mixed simple and mucopurulent chronic bronchitis: Secondary | ICD-10-CM

## 2019-07-19 DIAGNOSIS — M0579 Rheumatoid arthritis with rheumatoid factor of multiple sites without organ or systems involvement: Secondary | ICD-10-CM | POA: Diagnosis not present

## 2019-07-20 MED ORDER — ANORO ELLIPTA 62.5-25 MCG/INH IN AEPB: 1.0000 | INHALATION_SPRAY | Freq: Every day | RESPIRATORY_TRACT | 5 refills | Status: DC

## 2019-07-20 NOTE — H&P (Signed)
Patient ID:   [id number] Patient: Gabrielle Pineda  Date of Birth: 09-07-1955 Visit Type: Office Visit   Date: 06/07/2019 10:45 AM Provider: Marchia Meiers. Vertell Limber MD   This 63 year old female presents for back pain.  HISTORY OF PRESENT ILLNESS:  1.  back pain  Patient returns for evaluation of lumbar and bilateral leg pain increasing since April.  She recalls no injury.  Currently taking no pain medication.   Do MRI on canopy   the patient had cervical radiographs and lumbar radiographs which I reviewed today.  There is no acute pathology in her cervical spine and she has had multiple previous cervical spinal surgeries.  She does not appear to have instability on flexion and extension views of her cervical spine.  Her lumbar radiographs show no instability.    Lumbar MRI demonstrates significant spinal stenosis L3 through S1 levels.    The patient complains of neurogenic claudication which is not improving with conservative management including physical therapy  And injections.   the spinal stenosis is fairly marked.  The patient is increasingly limited in terms of exercise tolerance and has to sit down because her legs for after walking a short distance.  The patient is complaining of pain in both of her hips with weakness into both her legs.  Do she says that her neck is giving her trouble as well.  This has not progressed.  The patient notes a history of rheumatoid and osteoarthritis.  She smokes 1 pack per day of cigarettes.  She notes neuropathy due to both her feet.  The patient describes severe low back and bilateral buttock pain and is only able to be up for 5 minutes at a time.    Lumbar imaging demonstrates spinal stenosis which is moderately severe at the L5-S1 level and markedly severe at L4-5 level and mildly severe at the L3-4 level. her cervical imaging demonstrates previous cervical fusion surgery C3-4, C4 through C6, C6-T1 levels with questionable nonunion.    Her lumbar  images include plain radiographs which do not demonstrate any instability with flexion extension views.    On examination today the patient has bilateral sciatic notch discomfort.  She is able to bend within 6 in of floor with her upper extremities outstretched.  She has the ability to stand her toes and on her heels.  She appears to have full strength in both lower extremities.         Medical/Surgical/Interim History Reviewed, no change.  Last detailed document date:06/29/2016.     PAST MEDICAL HISTORY, SURGICAL HISTORY, FAMILY HISTORY, SOCIAL HISTORY AND REVIEW OF SYSTEMS I have reviewed the patient's past medical, surgical, family and social history as well as the comprehensive review of systems as included on the [facility] history form dated 06/07/2019, which I have signed.  Family History:  Reviewed, no changes.  Last detailed document date:03/08/2013.   Social History: Reviewed, no changes. Last detailed document date: 03/08/2013.    MEDICATIONS: (added, continued or stopped this visit) Started Medication Directions Instruction Stopped   acyclovir 5 % topical cream      Anoro Ellipta 62.5 mcg-25 mcg/actuation powder for inhalation      aripiprazole 10 mg tablet      atorvastatin 40 mg tablet      B-Complex tablet one tablet by mouth daily.     buspirone 30 mg tablet      diclofenac 1 % topical gel      diltiazem ER 180 mg  capsule,24 hr,extended release      [employer] 56 mg-40 mg-130 mg tablet      ezetimibe 10 mg tablet      fluticasone propionate 50 mcg/actuation nasal spray,suspension      folic acid 1 mg tablet take 1 tablet by oral route  every day     ibuprofen 200 mg tablet take 1 tablet by oral route  every 6 hours as needed with food     ipratropium bromide 0.02 % solution for inhalation      leflunomide 10 mg tablet      Lipitor  ORAL      meloxicam 15 mg tablet take 1 tablet by oral route  every day    01/07/2017  METHOCARBAM 500MG    TAB TAKE 1 TABLET BY MOUTH THREE TIMES DAILY AS NEEDED     methotrexate sodium 2.5 mg tablet take 1 tablet by oral route  every 12 hours for 3 doses given as a course once weekly     multivitamin tablet one tablet by mouth daily.     omeprazole 20 mg tablet,delayed release      pantoprazole 40 mg tablet,delayed release      potassium chloride ER 20 mEq tablet,extended release      sertraline 100 mg tablet take 1 tablet by oral route  every day    06/07/2019 tramadol 50 mg tablet take 1 tablet by oral route  every 6 hours as needed     trazodone 50 mg tablet take 1 tablet by oral route  every bedtime     Trokendi XR 25 mg capsule,extended release      Valtrex 500 mg tablet take 1 tablet by oral route 4 times every day     venlafaxine ER 75 mg tablet,extended release 24 hr take 1 tablet by oral route  every day in the morning at the same time each day with food     Xiidra 5 % eye drops in a dropperette        ALLERGIES: Ingredient Reaction Medication Name Comment  HYDROXYCHLOROQUINE     HYDROXYCHLOROQUINE SULFATE  Plaquenil    Reviewed, updated.    PHYSICAL EXAM:   Vitals Date Temp F BP Pulse Ht In Wt Lb BMI BSA Pain Score  06/07/2019  138/84 91 69 151 22.3  9/10      IMPRESSION:     Patient has neurogenic claudication and severe spinal stenosis refractory to conservative management.  She has severe spinal stenosis at L4-5 and to a lesser degree at L5-S1 and L3-4 levels.  She is only able to stand for 5 minutes at a time.  PLAN:   I have recommended proceeding with decompressive lumbar laminectomy L3 through S1 levels.  Patient wishes to proceed with surgery.  Details of the procedure and patient education was today.  The patient understands the risks and benefits of surgery and wishes to proceed  Orders: Diagnostic Procedures: Assessment Procedure  M54.12 Cervical Spine- AP/Lat/Flex/Ex  M54.16 Lumbar Spine- AP/Lat/Flex/Ex   Instruction(s)/Education: Assessment Instruction   Tobacco cessation counseling  I10 Lifestyle education   Completed Orders (this encounter) Order Details Reason Side Interpretation Result Initial Treatment Date Region  Lifestyle education Patient will follow up with primary care physician.        Cervical Spine- AP/Lat/Flex/Ex W/SWIMMER'S     06/07/2019   Lumbar Spine- AP/Lat/Flex/Ex      06/07/2019   Tobacco cessation counseling          Assessment/Plan   #  Detail Type Description   1. Assessment Low back pain, unspecified back pain laterality, with sciatica presence unspecified (M54.5).       2. Assessment Radiculopathy, lumbar region (M54.16).       3. Assessment Radiculopathy, cervical region (M54.12).       4. Assessment Lumbar stenosis with neurogenic claudication (M48.062).       5. Assessment Essential (primary) hypertension (I10).         Pain Management Plan Pain Scale: 9/10. Method: Numeric Pain Intensity Scale. Location: back. Onset: 03/02/2014. Duration: varies. Quality: discomforting. Pain management follow-up plan of care: Patient will continue medication management.     MEDICATIONS PRESCRIBED TODAY    Rx Quantity Refills  TRAMADOL HCL 50 mg  60 0            Provider:  Marchia Meiers. Vertell Limber MD  06/17/2019 02:58 PM    Dictation edited by: Marchia Meiers. Vertell Limber    CC Providers: Reginia Forts [facility] [place],  [place]  [id number]-   Don Noah  [address] [place], [place] [address]-               Electronically signed by Marchia Meiers Vertell Limber MD on 06/17/2019 02:58 PM

## 2019-07-24 ENCOUNTER — Other Ambulatory Visit: Payer: Self-pay | Admitting: Cardiovascular Disease

## 2019-07-25 DIAGNOSIS — M0579 Rheumatoid arthritis with rheumatoid factor of multiple sites without organ or systems involvement: Secondary | ICD-10-CM | POA: Diagnosis not present

## 2019-07-29 ENCOUNTER — Other Ambulatory Visit: Payer: Self-pay | Admitting: Physician Assistant

## 2019-07-29 ENCOUNTER — Other Ambulatory Visit: Payer: Self-pay | Admitting: Cardiovascular Disease

## 2019-07-29 DIAGNOSIS — B001 Herpesviral vesicular dermatitis: Secondary | ICD-10-CM

## 2019-07-29 DIAGNOSIS — I10 Essential (primary) hypertension: Secondary | ICD-10-CM

## 2019-08-03 DIAGNOSIS — H04123 Dry eye syndrome of bilateral lacrimal glands: Secondary | ICD-10-CM | POA: Diagnosis not present

## 2019-08-03 DIAGNOSIS — H524 Presbyopia: Secondary | ICD-10-CM | POA: Diagnosis not present

## 2019-08-03 DIAGNOSIS — H5203 Hypermetropia, bilateral: Secondary | ICD-10-CM | POA: Diagnosis not present

## 2019-08-08 NOTE — Progress Notes (Signed)
[place] ([place]), [place] [address] [place] ([place]) [place] [address] Phone: [phone] Fax: [phone]      Your procedure is scheduled on August 15, 2019.  Report to [facility] Main Entrance "A" at 5:30 A.M., and check in at the Admitting office.  Call this number if you have problems the morning of surgery:  [phone]  Call [phone] if you have any questions prior to your surgery date Monday-Friday 8am-4pm    Remember:  Do not eat or drink after midnight the night before your surgery   Take these medicines the morning of surgery with A SIP OF WATER: ARIPiprazole (ABILIFY)  busPIRone (BUSPAR) CARTIA XT ezetimibe (ZETIA)  fluticasone (FLONASE) gabapentin (NEURONTIN) pantoprazole (PROTONIX) omeprazole (PRILOSEC) topiramate (TOPAMAX) venlafaxine XR (EFFEXOR-XR) XIIDRA 5 % SOLN - eye drops Guaifenesin (MUCINEX MAXIMUM STRENGTH) ipratropium (ATROVENT) - as needed traMADol (ULTRAM) - as needed  As of today, STOP taking any Aspirin (unless otherwise instructed by your surgeon), Aleve, Naproxen, Ibuprofen, Motrin, Advil, Goody's, BC's, all herbal medications, fish oil, and all vitamins.    The Morning of Surgery  Do not wear jewelry, make-up or nail polish.  Do not wear lotions, powders, or perfumes or deodorant  Do not shave 48 hours prior to surgery.    Do not bring valuables to the hospital.  [facility] is not responsible for any belongings or valuables.  If you are a smoker, DO NOT Smoke 24 hours prior to surgery  If you wear a CPAP at night please bring your mask, tubing, and machine the morning of surgery   Remember that you must have someone to transport you home after your surgery, and remain with you for 24 hours if you are discharged the same day.   Please bring cases for contacts, glasses, hearing aids, dentures or bridgework because it cannot be worn into surgery.    Leave your  suitcase in the car.  After surgery it may be brought to your room.  For patients admitted to the hospital, discharge time will be determined by your treatment team.  Patients discharged the day of surgery will not be allowed to drive home.    Special instructions:   [facility]- Preparing For Surgery  Before surgery, you can play an important role. Because skin is not sterile, your skin needs to be as free of germs as possible. You can reduce the number of germs on your skin by washing with CHG (chlorahexidine gluconate) Soap before surgery.  CHG is an antiseptic cleaner which kills germs and bonds with the skin to continue killing germs even after washing.    Oral Hygiene is also important to reduce your risk of infection.  Remember - BRUSH YOUR TEETH THE MORNING OF SURGERY WITH YOUR REGULAR TOOTHPASTE  Please do not use if you have an allergy to CHG or antibacterial soaps. If your skin becomes reddened/irritated stop using the CHG.  Do not shave (including legs and underarms) for at least 48 hours prior to first CHG shower. It is OK to shave your face.  Please follow these instructions carefully.   1. Shower the NIGHT BEFORE SURGERY and the MORNING OF SURGERY with CHG Soap.   2. If you chose to wash your hair, wash your hair first as usual with your normal shampoo.  3. After you shampoo, rinse your hair and body thoroughly to remove the shampoo.  4. Use CHG as you would any other liquid soap. You  can apply CHG directly to the skin and wash gently with a scrungie or a clean washcloth.   5. Apply the CHG Soap to your body ONLY FROM THE NECK DOWN.  Do not use on open wounds or open sores. Avoid contact with your eyes, ears, mouth and genitals (private parts). Wash Face and genitals (private parts)  with your normal soap.   6. Wash thoroughly, paying special attention to the area where your surgery will be performed.  7. Thoroughly rinse your body with warm water from the neck  down.  8. DO NOT shower/wash with your normal soap after using and rinsing off the CHG Soap.  9. Pat yourself dry with a CLEAN TOWEL.  10. Wear CLEAN PAJAMAS to bed the night before surgery, wear comfortable clothes the morning of surgery  11. Place CLEAN SHEETS on your bed the night of your first shower and DO NOT SLEEP WITH PETS.    Day of Surgery:  Please shower the morning of surgery with the CHG soap Do not apply any deodorants/lotions. Please wear clean clothes to the hospital/surgery center.   Remember to brush your teeth WITH YOUR REGULAR TOOTHPASTE.   Please read over the following fact sheets that you were given.

## 2019-08-09 ENCOUNTER — Encounter (HOSPITAL_COMMUNITY): Payer: Self-pay

## 2019-08-09 ENCOUNTER — Encounter (HOSPITAL_COMMUNITY)
Admission: RE | Admit: 2019-08-09 | Discharge: 2019-08-09 | Disposition: A | Discharge status: Home / Self Care | Payer: PPO | Source: Ambulatory Visit | Attending: Neurosurgery | Admitting: Neurosurgery

## 2019-08-09 ENCOUNTER — Other Ambulatory Visit: Payer: Self-pay

## 2019-08-09 DIAGNOSIS — M069 Rheumatoid arthritis, unspecified: Secondary | ICD-10-CM | POA: Diagnosis not present

## 2019-08-09 DIAGNOSIS — Z01818 Encounter for other preprocedural examination: Secondary | ICD-10-CM | POA: Diagnosis not present

## 2019-08-09 DIAGNOSIS — I471 Supraventricular tachycardia: Secondary | ICD-10-CM | POA: Diagnosis not present

## 2019-08-09 DIAGNOSIS — Z79899 Other long term (current) drug therapy: Secondary | ICD-10-CM | POA: Insufficient documentation

## 2019-08-09 HISTORY — DX: Dyspnea, unspecified: R06.00

## 2019-08-09 HISTORY — DX: Headache, unspecified: R51.9

## 2019-08-09 HISTORY — DX: Anxiety disorder, unspecified: F41.9

## 2019-08-09 LAB — COMPREHENSIVE METABOLIC PANEL
ALT: 19 U/L (ref 0–44)
AST: 24 U/L (ref 15–41)
Albumin: 4 g/dL (ref 3.5–5.0)
Alkaline Phosphatase: 101 U/L (ref 38–126)
Anion gap: 8 (ref 5–15)
BUN: 11 mg/dL (ref 8–23)
CO2: 18 mmol/L — ABNORMAL LOW (ref 22–32)
Calcium: 8.9 mg/dL (ref 8.9–10.3)
Chloride: 115 mmol/L — ABNORMAL HIGH (ref 98–111)
Creatinine, Ser: 0.93 mg/dL (ref 0.44–1.00)
GFR calc Af Amer: >60 mL/min (ref >60)
GFR calc non Af Amer: >60 mL/min (ref >60)
Glucose, Bld: 100 mg/dL — ABNORMAL HIGH (ref 70–99)
Potassium: 3.5 mmol/L (ref 3.5–5.1)
Sodium: 141 mmol/L (ref 135–145)
Total Bilirubin: 0.3 mg/dL (ref 0.3–1.2)
Total Protein: 6.3 g/dL — ABNORMAL LOW (ref 6.5–8.1)

## 2019-08-09 LAB — CBC
HCT: 39.6 % (ref 36.0–46.0)
Hemoglobin: 12.8 g/dL (ref 12.0–15.0)
MCH: 33.4 pg (ref 26.0–34.0)
MCHC: 32.3 g/dL (ref 30.0–36.0)
MCV: 103.4 fL — ABNORMAL HIGH (ref 80.0–100.0)
Platelets: 188 10*3/uL (ref 150–400)
RBC: 3.83 MIL/uL — ABNORMAL LOW (ref 3.87–5.11)
RDW: 12.7 % (ref 11.5–15.5)
WBC: 6.7 10*3/uL (ref 4.0–10.5)
nRBC: 0 % (ref 0.0–0.2)

## 2019-08-09 LAB — SURGICAL PCR SCREEN
MRSA, PCR: POSITIVE — AB
Staphylococcus aureus: POSITIVE — AB

## 2019-08-09 NOTE — Progress Notes (Signed)
PCP - Fenton Malling, PA Cardiologist - [facility]: Dr. Cheral Almas. Gastroenterologist: Dr. Carlyle Dolly  PPM/ICD - denies  Chest x-ray - 08/22/2018 EKG - 06/20/2019 Stress Test - 10/29/2010 ECHO - Patient does not remember when/where echo was done. Cardiac Cath - Denies  Sleep Study - Denies   COVID TEST- Scheduled 08/14/2019   Anesthesia review: Yes, cardiac hx  Patient denies shortness of breath, fever, cough and chest pain at PAT appointment   All instructions explained to the patient, with a verbal understanding of the material. Patient agrees to go over the instructions while at home for a better understanding. Patient also instructed to self quarantine after being tested for COVID-19. The opportunity to ask questions was provided.

## 2019-08-09 NOTE — Progress Notes (Signed)
Called patient and left voicemail about positive MRSA and staph results. Prescription called to patient's [place] for Mupirocin ointment.

## 2019-08-09 NOTE — Progress Notes (Signed)
Called Dr. Melven Sartorius office and left voicemail about patient's PCR being positive for Staph and MRSA. [employer] also sent to Dr. Vertell Limber.

## 2019-08-10 NOTE — Anesthesia Preprocedure Evaluation (Deleted)
Anesthesia Evaluation    Airway        Dental   Pulmonary Current Smoker,           Cardiovascular      Neuro/Psych    GI/Hepatic   Endo/Other    Renal/GU      Musculoskeletal   Abdominal   Peds  Hematology   Anesthesia Other Findings   Reproductive/Obstetrics                                                              Anesthesia Evaluation  Patient identified by MRN, date of birth, ID band Patient awake    Reviewed: Allergy & Precautions, NPO status , Patient's Chart, lab work & pertinent test results, reviewed documented beta blocker date and time   Airway Mallampati: II  TM Distance: >3 FB     Dental  (+) Chipped   Pulmonary shortness of breath, COPD, Current Smoker,           Cardiovascular hypertension, + dysrhythmias Supra Ventricular Tachycardia + Valvular Problems/Murmurs      Neuro/Psych PSYCHIATRIC DISORDERS Depression  Neuromuscular disease    GI/Hepatic GERD  ,  Endo/Other    Renal/GU      Musculoskeletal  (+) Arthritis , Rheumatoid disorders,    Abdominal   Peds  Hematology  (+) anemia ,   Anesthesia Other Findings ETOH.Bronchitis.  Reproductive/Obstetrics                             Anesthesia Physical Anesthesia Plan  ASA: III  Anesthesia Plan: General   Post-op Pain Management:    Induction: Intravenous  PONV Risk Score and Plan:   Airway Management Planned:   Additional Equipment:   Intra-op Plan:   Post-operative Plan:   Informed Consent: I have reviewed the patients History and Physical, chart, labs and discussed the procedure including the risks, benefits and alternatives for the proposed anesthesia with the patient or authorized representative who has indicated his/her understanding and acceptance.     Plan Discussed with: CRNA  Anesthesia Plan Comments:         Anesthesia Quick  Evaluation  Anesthesia Physical Anesthesia Plan  ASA:   Anesthesia Plan:    Post-op Pain Management:    Induction:   PONV Risk Score and Plan:   Airway Management Planned:   Additional Equipment:   Intra-op Plan:   Post-operative Plan:   Informed Consent:   Plan Discussed with:   Anesthesia Plan Comments: (Follows with cardiology for history of paroxysmal atrial tachycardia/possible SVT. Last seen 06/20/19, per note palpitations well controled and stable on cardizem and she was cleared for surgery "Preoperative cardiac evaluation: She is able to achieve at least 4 METs without cardiac limitation.  She is scheduled for L3-S1 decompressive laminectomy.  Per Revised Cardiac Index, she is low risk for noncardiac surgery and may proceed without any further cardiac testing."  Patient underwent remote treadmill Myoview in 10/2010 in which she exercised for 5 minutes and 30 seconds without chest pain and achieved 7.0 METs and exhibited a hypertensive response to exercise.  No evidence of ischemia.  EF 55% with normal wall motion.  Overall, this was a normal nuclear study.  Follows with [facility]ology at [facility] for  hx of RA, maintained on leflunomide.  Preop labs reviewed, unremarkable.   EKG 06/20/19: NSR. Rate 83. RAE. Nonspecific ST and T wave abnormality.  )        Anesthesia Quick Evaluation

## 2019-08-10 NOTE — Progress Notes (Signed)
Anesthesia Chart Review:  Follows with cardiology for history of paroxysmal atrial tachycardia/possible SVT. Last seen 06/20/19, per note palpitations well controled and stable on cardizem and she was cleared for surgery "Preoperative cardiac evaluation: She is able to achieve at least 4 METs without cardiac limitation.  She is scheduled for L3-S1 decompressive laminectomy.  Per Revised Cardiac Index, she is low risk for noncardiac surgery and may proceed without any further cardiac testing."  Patient underwent remote treadmill Myoview in 10/2010 in which she exercised for 5 minutes and 30 seconds without chest pain and achieved 7.0 METs and exhibited a hypertensive response to exercise.  No evidence of ischemia.  EF 55% with normal wall motion.  Overall, this was a normal nuclear study.  Follows with rheumatology at [facility] for hx of RA, maintained on leflunomide.  Preop labs reviewed, unremarkable.   EKG 06/20/19: NSR. Rate 83. RAE. Nonspecific ST and T wave abnormality.     Wynonia Musty [facility] Short Stay Center/Anesthesiology Phone [phone] 08/10/2019 10:30 AM

## 2019-08-14 ENCOUNTER — Other Ambulatory Visit
Admission: RE | Admit: 2019-08-14 | Discharge: 2019-08-14 | Disposition: A | Discharge status: Home / Self Care | Payer: PPO | Source: Ambulatory Visit | Attending: Neurosurgery | Admitting: Neurosurgery

## 2019-08-14 ENCOUNTER — Other Ambulatory Visit: Payer: Self-pay

## 2019-08-14 DIAGNOSIS — Z01812 Encounter for preprocedural laboratory examination: Secondary | ICD-10-CM | POA: Diagnosis not present

## 2019-08-14 DIAGNOSIS — Z20828 Contact with and (suspected) exposure to other viral communicable diseases: Secondary | ICD-10-CM | POA: Insufficient documentation

## 2019-08-14 MED ORDER — VANCOMYCIN HCL IN DEXTROSE 1-5 GM/200ML-% IV SOLN
1000.0000 mg | INTRAVENOUS | Status: AC
  Administered 2019-08-15: 1000 mg via INTRAVENOUS
  Filled 2019-08-14: qty 200

## 2019-08-14 MED ORDER — CEFAZOLIN SODIUM-DEXTROSE 2-4 GM/100ML-% IV SOLN
2.0000 g | INTRAVENOUS | Status: AC
  Administered 2019-08-15: 1 g via INTRAVENOUS
  Filled 2019-08-14: qty 100

## 2019-08-14 NOTE — Anesthesia Preprocedure Evaluation (Addendum)
Anesthesia Evaluation  Patient identified by MRN, date of birth, ID band Patient awake    Reviewed: Allergy & Precautions, NPO status , Patient's Chart, lab work & pertinent test results  History of Anesthesia Complications Negative for: history of anesthetic complications  Airway Mallampati: III  TM Distance: >3 FB Neck ROM: Full    Dental no notable dental hx. (+) Dental Advisory Given   Pulmonary shortness of breath, COPD, Current Smoker and Patient abstained from smoking.,    Pulmonary exam normal        Cardiovascular hypertension, Pt. on medications Normal cardiovascular exam+ dysrhythmias Supra Ventricular Tachycardia + Valvular Problems/Murmurs   Follows with cardiology for history of paroxysmal atrial tachycardia/possible SVT. Last seen 06/20/19, per note palpitations well controled and stable on cardizem and she was cleared for surgery "Preoperative cardiac evaluation: She is able to achieve at least 4 METs without cardiac limitation.  She is scheduled for L3-S1 decompressive laminectomy.  Per Revised Cardiac Index, she is low risk for noncardiac surgery and may proceed without any further cardiac testing."  Patient underwent remote treadmill Myoview in 10/2010 in which she exercised for 5 minutes and 30 seconds without chest pain and achieved 7.0 METs and exhibited a hypertensive response to exercise.  No evidence of ischemia.  EF 55% with normal wall motion.  Overall, this was a normal nuclear study.  Follows with rheumatology at [facility] for hx of RA, maintained on leflunomide.  Preop labs reviewed, unremarkable.   EKG 06/20/19: NSR. Rate 83. RAE. Nonspecific ST and T wave abnormality.     Neuro/Psych PSYCHIATRIC DISORDERS Depression  Neuromuscular disease    GI/Hepatic Neg liver ROS, GERD  ,  Endo/Other  negative endocrine ROS  Renal/GU negative Renal ROS     Musculoskeletal  (+) Arthritis , Rheumatoid disorders,    Abdominal   Peds  Hematology  (+) anemia ,   Anesthesia Other Findings ETOH.Bronchitis.  Reproductive/Obstetrics                            Anesthesia Physical  Anesthesia Plan  ASA: III  Anesthesia Plan: General   Post-op Pain Management:    Induction: Intravenous  PONV Risk Score and Plan: 3 and Ondansetron, Dexamethasone and Midazolam  Airway Management Planned: Oral ETT  Additional Equipment:   Intra-op Plan:   Post-operative Plan: Extubation in OR  Informed Consent: I have reviewed the patients History and Physical, chart, labs and discussed the procedure including the risks, benefits and alternatives for the proposed anesthesia with the patient or authorized representative who has indicated his/her understanding and acceptance.     Dental advisory given  Plan Discussed with: CRNA and Anesthesiologist  Anesthesia Plan Comments:        Anesthesia Quick Evaluation

## 2019-08-15 ENCOUNTER — Encounter (HOSPITAL_COMMUNITY): Payer: Self-pay | Admitting: Neurosurgery

## 2019-08-15 ENCOUNTER — Ambulatory Visit (HOSPITAL_COMMUNITY): Payer: PPO | Admitting: Physician Assistant

## 2019-08-15 ENCOUNTER — Encounter (HOSPITAL_COMMUNITY)
Admission: RE | Disposition: A | Discharge status: Home / Self Care | Payer: Self-pay | Source: Home / Self Care | Attending: Neurosurgery

## 2019-08-15 ENCOUNTER — Ambulatory Visit (HOSPITAL_COMMUNITY): Payer: PPO | Admitting: Anesthesiology

## 2019-08-15 ENCOUNTER — Inpatient Hospital Stay (HOSPITAL_COMMUNITY)
Admission: RE | Admit: 2019-08-15 | Discharge: 2019-08-16 | DRG: 517 | Disposition: A | Discharge status: Home / Self Care | Payer: PPO | Attending: Neurosurgery | Admitting: Neurosurgery

## 2019-08-15 ENCOUNTER — Other Ambulatory Visit: Payer: Self-pay

## 2019-08-15 ENCOUNTER — Ambulatory Visit (HOSPITAL_COMMUNITY): Payer: PPO

## 2019-08-15 DIAGNOSIS — M48061 Spinal stenosis, lumbar region without neurogenic claudication: Secondary | ICD-10-CM | POA: Diagnosis present

## 2019-08-15 DIAGNOSIS — F17210 Nicotine dependence, cigarettes, uncomplicated: Secondary | ICD-10-CM | POA: Diagnosis present

## 2019-08-15 DIAGNOSIS — M069 Rheumatoid arthritis, unspecified: Secondary | ICD-10-CM | POA: Diagnosis present

## 2019-08-15 DIAGNOSIS — M47817 Spondylosis without myelopathy or radiculopathy, lumbosacral region: Secondary | ICD-10-CM | POA: Diagnosis not present

## 2019-08-15 DIAGNOSIS — Z888 Allergy status to other drugs, medicaments and biological substances status: Secondary | ICD-10-CM

## 2019-08-15 DIAGNOSIS — M4802 Spinal stenosis, cervical region: Secondary | ICD-10-CM | POA: Diagnosis not present

## 2019-08-15 DIAGNOSIS — I10 Essential (primary) hypertension: Secondary | ICD-10-CM | POA: Diagnosis present

## 2019-08-15 DIAGNOSIS — Z981 Arthrodesis status: Secondary | ICD-10-CM

## 2019-08-15 DIAGNOSIS — Z79899 Other long term (current) drug therapy: Secondary | ICD-10-CM | POA: Diagnosis not present

## 2019-08-15 DIAGNOSIS — M4804 Spinal stenosis, thoracic region: Secondary | ICD-10-CM | POA: Diagnosis present

## 2019-08-15 DIAGNOSIS — Z419 Encounter for procedure for purposes other than remedying health state, unspecified: Secondary | ICD-10-CM

## 2019-08-15 DIAGNOSIS — M48062 Spinal stenosis, lumbar region with neurogenic claudication: Secondary | ICD-10-CM | POA: Diagnosis not present

## 2019-08-15 DIAGNOSIS — M5116 Intervertebral disc disorders with radiculopathy, lumbar region: Secondary | ICD-10-CM | POA: Diagnosis not present

## 2019-08-15 DIAGNOSIS — M549 Dorsalgia, unspecified: Secondary | ICD-10-CM | POA: Diagnosis present

## 2019-08-15 DIAGNOSIS — M4807 Spinal stenosis, lumbosacral region: Secondary | ICD-10-CM | POA: Diagnosis not present

## 2019-08-15 DIAGNOSIS — M5416 Radiculopathy, lumbar region: Secondary | ICD-10-CM | POA: Diagnosis not present

## 2019-08-15 DIAGNOSIS — E7800 Pure hypercholesterolemia, unspecified: Secondary | ICD-10-CM | POA: Diagnosis not present

## 2019-08-15 DIAGNOSIS — J449 Chronic obstructive pulmonary disease, unspecified: Secondary | ICD-10-CM | POA: Diagnosis not present

## 2019-08-15 DIAGNOSIS — M4726 Other spondylosis with radiculopathy, lumbar region: Secondary | ICD-10-CM | POA: Diagnosis not present

## 2019-08-15 HISTORY — PX: LUMBAR LAMINECTOMY/DECOMPRESSION MICRODISCECTOMY: SHX5026

## 2019-08-15 LAB — SARS CORONAVIRUS 2 (TAT 6-24 HRS): SARS Coronavirus 2: NEGATIVE

## 2019-08-15 SURGERY — LUMBAR LAMINECTOMY/DECOMPRESSION MICRODISCECTOMY 3 LEVELS
Anesthesia: General

## 2019-08-15 MED ORDER — LACTATED RINGERS IV SOLN: INTRAVENOUS | Status: DC

## 2019-08-15 MED ORDER — LIDOCAINE-EPINEPHRINE 1 %-1:100000 IJ SOLN
INTRAMUSCULAR | Status: AC
  Filled 2019-08-15: qty 1

## 2019-08-15 MED ORDER — DEXAMETHASONE SODIUM PHOSPHATE 10 MG/ML IJ SOLN
INTRAMUSCULAR | Status: AC
  Filled 2019-08-15: qty 1

## 2019-08-15 MED ORDER — LIFITEGRAST 5 % OP SOLN: 1.0000 [drp] | Freq: Every day | OPHTHALMIC | Status: DC

## 2019-08-15 MED ORDER — ONDANSETRON HCL 4 MG/2ML IJ SOLN
INTRAMUSCULAR | Status: DC | PRN
  Administered 2019-08-15: 4 mg via INTRAVENOUS

## 2019-08-15 MED ORDER — TRAZODONE HCL 100 MG PO TABS
200.0000 mg | ORAL_TABLET | Freq: Every day | ORAL | Status: DC
  Administered 2019-08-15: 200 mg via ORAL
  Filled 2019-08-15 (×2): qty 2

## 2019-08-15 MED ORDER — PHENYLEPHRINE HCL-NACL 10-0.9 MG/250ML-% IV SOLN
INTRAVENOUS | Status: DC | PRN
  Administered 2019-08-15: 25 ug/min via INTRAVENOUS

## 2019-08-15 MED ORDER — FLEET ENEMA 7-19 GM/118ML RE ENEM: 1.0000 | ENEMA | Freq: Once | RECTAL | Status: DC | PRN

## 2019-08-15 MED ORDER — MENTHOL 3 MG MT LOZG: 1.0000 | LOZENGE | OROMUCOSAL | Status: DC | PRN

## 2019-08-15 MED ORDER — CHLORHEXIDINE GLUCONATE CLOTH 2 % EX PADS: 6.0000 | MEDICATED_PAD | Freq: Once | CUTANEOUS | Status: DC

## 2019-08-15 MED ORDER — ROCURONIUM BROMIDE 50 MG/5ML IV SOSY
PREFILLED_SYRINGE | INTRAVENOUS | Status: DC | PRN
  Administered 2019-08-15: 60 mg via INTRAVENOUS

## 2019-08-15 MED ORDER — VALACYCLOVIR HCL 500 MG PO TABS
2000.0000 mg | ORAL_TABLET | Freq: Two times a day (BID) | ORAL | Status: DC | PRN
  Filled 2019-08-15: qty 4

## 2019-08-15 MED ORDER — ROCURONIUM BROMIDE 10 MG/ML (PF) SYRINGE
PREFILLED_SYRINGE | INTRAVENOUS | Status: AC
  Filled 2019-08-15: qty 10

## 2019-08-15 MED ORDER — HYDROCODONE-ACETAMINOPHEN 10-325 MG PO TABS
1.0000 | ORAL_TABLET | ORAL | Status: DC | PRN
  Administered 2019-08-15 (×2): 2 via ORAL
  Filled 2019-08-15 (×2): qty 2

## 2019-08-15 MED ORDER — ACETAMINOPHEN 650 MG RE SUPP: 650.0000 mg | RECTAL | Status: DC | PRN

## 2019-08-15 MED ORDER — OXYCODONE HCL 5 MG PO TABS
5.0000 mg | ORAL_TABLET | ORAL | Status: DC | PRN
  Administered 2019-08-15 – 2019-08-16 (×4): 10 mg via ORAL
  Filled 2019-08-15 (×4): qty 2

## 2019-08-15 MED ORDER — LIDOCAINE-EPINEPHRINE 1 %-1:100000 IJ SOLN
INTRAMUSCULAR | Status: DC | PRN
  Administered 2019-08-15: 10 mL

## 2019-08-15 MED ORDER — EZETIMIBE 10 MG PO TABS
10.0000 mg | ORAL_TABLET | Freq: Every day | ORAL | Status: DC
  Administered 2019-08-16: 10 mg via ORAL
  Filled 2019-08-15: qty 1

## 2019-08-15 MED ORDER — FENTANYL CITRATE (PF) 100 MCG/2ML IJ SOLN
INTRAMUSCULAR | Status: AC
  Filled 2019-08-15: qty 2

## 2019-08-15 MED ORDER — EPHEDRINE SULFATE-NACL 50-0.9 MG/10ML-% IV SOSY
PREFILLED_SYRINGE | INTRAVENOUS | Status: DC | PRN
  Administered 2019-08-15 (×2): 5 mg via INTRAVENOUS

## 2019-08-15 MED ORDER — PROPOFOL 10 MG/ML IV BOLUS
INTRAVENOUS | Status: DC | PRN
  Administered 2019-08-15: 150 ug via INTRAVENOUS

## 2019-08-15 MED ORDER — KCL IN DEXTROSE-NACL 20-5-0.45 MEQ/L-%-% IV SOLN: INTRAVENOUS | Status: DC

## 2019-08-15 MED ORDER — MELOXICAM 7.5 MG PO TABS
15.0000 mg | ORAL_TABLET | Freq: Every day | ORAL | Status: DC
  Administered 2019-08-15: 20:00:00 15 mg via ORAL
  Filled 2019-08-15 (×2): qty 2

## 2019-08-15 MED ORDER — THROMBIN 5000 UNITS EX SOLR
OROMUCOSAL | Status: DC | PRN
  Administered 2019-08-15: 5 mL via TOPICAL

## 2019-08-15 MED ORDER — PANTOPRAZOLE SODIUM 40 MG PO TBEC: 40.0000 mg | DELAYED_RELEASE_TABLET | Freq: Every day | ORAL | Status: DC

## 2019-08-15 MED ORDER — VITAMIN E 180 MG (400 UNIT) PO CAPS: 1600.0000 [IU] | ORAL_CAPSULE | Freq: Every day | ORAL | Status: DC

## 2019-08-15 MED ORDER — GUAIFENESIN ER 1200 MG PO TB12: 1200.0000 mg | ORAL_TABLET | Freq: Every day | ORAL | Status: DC

## 2019-08-15 MED ORDER — DOCUSATE SODIUM 100 MG PO CAPS
100.0000 mg | ORAL_CAPSULE | Freq: Two times a day (BID) | ORAL | Status: DC
  Administered 2019-08-15 – 2019-08-16 (×2): 100 mg via ORAL
  Filled 2019-08-15 (×2): qty 1

## 2019-08-15 MED ORDER — DILTIAZEM HCL ER COATED BEADS 180 MG PO CP24
180.0000 mg | ORAL_CAPSULE | Freq: Every day | ORAL | Status: DC
  Administered 2019-08-16: 180 mg via ORAL
  Filled 2019-08-15: qty 1

## 2019-08-15 MED ORDER — LEFLUNOMIDE 20 MG PO TABS
10.0000 mg | ORAL_TABLET | Freq: Every day | ORAL | Status: DC
  Administered 2019-08-15: 10 mg via ORAL
  Filled 2019-08-15 (×2): qty 0.5

## 2019-08-15 MED ORDER — DEXAMETHASONE SODIUM PHOSPHATE 10 MG/ML IJ SOLN
INTRAMUSCULAR | Status: DC | PRN
  Administered 2019-08-15: 10 mg via INTRAVENOUS

## 2019-08-15 MED ORDER — PHENOL 1.4 % MT LIQD: 1.0000 | OROMUCOSAL | Status: DC | PRN

## 2019-08-15 MED ORDER — OXYCODONE HCL 5 MG PO TABS: 5.0000 mg | ORAL_TABLET | ORAL | Status: DC | PRN

## 2019-08-15 MED ORDER — FENTANYL CITRATE (PF) 250 MCG/5ML IJ SOLN
INTRAMUSCULAR | Status: AC
  Filled 2019-08-15: qty 5

## 2019-08-15 MED ORDER — SUGAMMADEX SODIUM 200 MG/2ML IV SOLN
INTRAVENOUS | Status: DC | PRN
  Administered 2019-08-15: 200 mg via INTRAVENOUS

## 2019-08-15 MED ORDER — ONDANSETRON HCL 4 MG/2ML IJ SOLN
INTRAMUSCULAR | Status: AC
  Filled 2019-08-15: qty 2

## 2019-08-15 MED ORDER — ATORVASTATIN CALCIUM 40 MG PO TABS
40.0000 mg | ORAL_TABLET | Freq: Every day | ORAL | Status: DC
  Administered 2019-08-16: 40 mg via ORAL
  Filled 2019-08-15: qty 1

## 2019-08-15 MED ORDER — METHOCARBAMOL 500 MG PO TABS
ORAL_TABLET | ORAL | Status: AC
  Filled 2019-08-15: qty 1

## 2019-08-15 MED ORDER — SODIUM CHLORIDE 0.9% FLUSH: 3.0000 mL | INTRAVENOUS | Status: DC | PRN

## 2019-08-15 MED ORDER — GABAPENTIN 600 MG PO TABS
600.0000 mg | ORAL_TABLET | Freq: Three times a day (TID) | ORAL | Status: DC
  Administered 2019-08-15 – 2019-08-16 (×3): 600 mg via ORAL
  Filled 2019-08-15 (×3): qty 1

## 2019-08-15 MED ORDER — ONDANSETRON HCL 4 MG/2ML IJ SOLN: 4.0000 mg | Freq: Four times a day (QID) | INTRAMUSCULAR | Status: DC | PRN

## 2019-08-15 MED ORDER — KETAMINE HCL 50 MG/5ML IJ SOSY
PREFILLED_SYRINGE | INTRAMUSCULAR | Status: AC
  Filled 2019-08-15: qty 5

## 2019-08-15 MED ORDER — UMECLIDINIUM-VILANTEROL 62.5-25 MCG/INH IN AEPB
1.0000 | INHALATION_SPRAY | Freq: Every day | RESPIRATORY_TRACT | Status: DC
  Administered 2019-08-16: 1 via RESPIRATORY_TRACT
  Filled 2019-08-15: qty 14

## 2019-08-15 MED ORDER — POTASSIUM CHLORIDE CRYS ER 20 MEQ PO TBCR: 20.0000 meq | EXTENDED_RELEASE_TABLET | Freq: Every day | ORAL | Status: DC | PRN

## 2019-08-15 MED ORDER — SODIUM CHLORIDE 0.9 % IV SOLN
250.0000 mL | INTRAVENOUS | Status: DC
  Administered 2019-08-15: 12:00:00 250 mL via INTRAVENOUS

## 2019-08-15 MED ORDER — PANTOPRAZOLE SODIUM 40 MG PO TBEC
40.0000 mg | DELAYED_RELEASE_TABLET | Freq: Every day | ORAL | Status: DC
  Administered 2019-08-16: 40 mg via ORAL
  Filled 2019-08-15: qty 1

## 2019-08-15 MED ORDER — CEFAZOLIN SODIUM-DEXTROSE 2-4 GM/100ML-% IV SOLN
2.0000 g | Freq: Three times a day (TID) | INTRAVENOUS | Status: AC
  Administered 2019-08-15 (×2): 2 g via INTRAVENOUS
  Filled 2019-08-15 (×2): qty 100

## 2019-08-15 MED ORDER — SERTRALINE HCL 50 MG PO TABS
100.0000 mg | ORAL_TABLET | Freq: Every day | ORAL | Status: DC
  Administered 2019-08-15: 100 mg via ORAL
  Filled 2019-08-15: qty 2

## 2019-08-15 MED ORDER — MIDAZOLAM HCL 5 MG/5ML IJ SOLN
INTRAMUSCULAR | Status: DC | PRN
  Administered 2019-08-15: 2 mg via INTRAVENOUS

## 2019-08-15 MED ORDER — UMECLIDINIUM-VILANTEROL 62.5-25 MCG/INH IN AEPB
1.0000 | INHALATION_SPRAY | Freq: Every day | RESPIRATORY_TRACT | Status: DC
  Filled 2019-08-15: qty 14

## 2019-08-15 MED ORDER — METHOCARBAMOL 1000 MG/10ML IJ SOLN
500.0000 mg | Freq: Four times a day (QID) | INTRAVENOUS | Status: DC | PRN
  Filled 2019-08-15: qty 5

## 2019-08-15 MED ORDER — VENLAFAXINE HCL ER 75 MG PO CP24
225.0000 mg | ORAL_CAPSULE | Freq: Every day | ORAL | Status: DC
  Administered 2019-08-16: 225 mg via ORAL
  Filled 2019-08-15: qty 3

## 2019-08-15 MED ORDER — ARIPIPRAZOLE 10 MG PO TABS
10.0000 mg | ORAL_TABLET | Freq: Every day | ORAL | Status: DC
  Administered 2019-08-16: 10 mg via ORAL
  Filled 2019-08-15: qty 1

## 2019-08-15 MED ORDER — POLYETHYLENE GLYCOL 3350 17 G PO PACK: 17.0000 g | PACK | Freq: Every day | ORAL | Status: DC | PRN

## 2019-08-15 MED ORDER — IPRATROPIUM BROMIDE 0.02 % IN SOLN: 0.5000 mg | Freq: Four times a day (QID) | RESPIRATORY_TRACT | Status: DC | PRN

## 2019-08-15 MED ORDER — HYDROCODONE-ACETAMINOPHEN 5-325 MG PO TABS: 2.0000 | ORAL_TABLET | ORAL | Status: DC | PRN

## 2019-08-15 MED ORDER — BUPIVACAINE HCL (PF) 0.5 % IJ SOLN
INTRAMUSCULAR | Status: DC | PRN
  Administered 2019-08-15: 10 mL

## 2019-08-15 MED ORDER — LIDOCAINE 2% (20 MG/ML) 5 ML SYRINGE
INTRAMUSCULAR | Status: AC
  Filled 2019-08-15: qty 5

## 2019-08-15 MED ORDER — FUROSEMIDE 20 MG PO TABS: 20.0000 mg | ORAL_TABLET | Freq: Every day | ORAL | Status: DC | PRN

## 2019-08-15 MED ORDER — 0.9 % SODIUM CHLORIDE (POUR BTL) OPTIME
TOPICAL | Status: DC | PRN
  Administered 2019-08-15: 1000 mL

## 2019-08-15 MED ORDER — CELECOXIB 200 MG PO CAPS
400.0000 mg | ORAL_CAPSULE | Freq: Once | ORAL | Status: AC
  Administered 2019-08-15: 400 mg via ORAL
  Filled 2019-08-15: qty 2

## 2019-08-15 MED ORDER — ZOLPIDEM TARTRATE 5 MG PO TABS: 5.0000 mg | ORAL_TABLET | Freq: Every evening | ORAL | Status: DC | PRN

## 2019-08-15 MED ORDER — LINACLOTIDE 145 MCG PO CAPS
145.0000 ug | ORAL_CAPSULE | Freq: Every day | ORAL | Status: DC
  Administered 2019-08-16: 145 ug via ORAL
  Filled 2019-08-15: qty 1

## 2019-08-15 MED ORDER — THROMBIN 5000 UNITS EX SOLR
CUTANEOUS | Status: AC
  Filled 2019-08-15: qty 5000

## 2019-08-15 MED ORDER — BUSPIRONE HCL 15 MG PO TABS
30.0000 mg | ORAL_TABLET | Freq: Three times a day (TID) | ORAL | Status: DC
  Administered 2019-08-15 – 2019-08-16 (×3): 30 mg via ORAL
  Filled 2019-08-15 (×5): qty 2

## 2019-08-15 MED ORDER — METHOCARBAMOL 500 MG PO TABS
500.0000 mg | ORAL_TABLET | Freq: Four times a day (QID) | ORAL | Status: DC | PRN
  Administered 2019-08-15 – 2019-08-16 (×3): 500 mg via ORAL
  Filled 2019-08-15 (×3): qty 1

## 2019-08-15 MED ORDER — FLUTICASONE PROPIONATE 50 MCG/ACT NA SUSP
2.0000 | Freq: Every day | NASAL | Status: DC
  Filled 2019-08-15: qty 16

## 2019-08-15 MED ORDER — PANTOPRAZOLE SODIUM 40 MG IV SOLR: 40.0000 mg | Freq: Every day | INTRAVENOUS | Status: DC

## 2019-08-15 MED ORDER — IBUPROFEN 200 MG PO TABS: 600.0000 mg | ORAL_TABLET | Freq: Four times a day (QID) | ORAL | Status: DC | PRN

## 2019-08-15 MED ORDER — FENTANYL CITRATE (PF) 100 MCG/2ML IJ SOLN
25.0000 ug | INTRAMUSCULAR | Status: DC | PRN
  Administered 2019-08-15 (×2): 50 ug via INTRAVENOUS

## 2019-08-15 MED ORDER — HYDROMORPHONE HCL 1 MG/ML IJ SOLN
0.5000 mg | INTRAMUSCULAR | Status: DC | PRN
  Administered 2019-08-15: 12:00:00 0.5 mg via INTRAVENOUS
  Filled 2019-08-15: qty 0.5

## 2019-08-15 MED ORDER — TRAMADOL HCL 50 MG PO TABS: 50.0000 mg | ORAL_TABLET | Freq: Four times a day (QID) | ORAL | Status: DC | PRN

## 2019-08-15 MED ORDER — LIDOCAINE 2% (20 MG/ML) 5 ML SYRINGE
INTRAMUSCULAR | Status: DC | PRN
  Administered 2019-08-15: 100 mg via INTRAVENOUS

## 2019-08-15 MED ORDER — KETAMINE HCL 10 MG/ML IJ SOLN
INTRAMUSCULAR | Status: DC | PRN
  Administered 2019-08-15: 10 mg via INTRAVENOUS
  Administered 2019-08-15: 30 mg via INTRAVENOUS
  Administered 2019-08-15: 10 mg via INTRAVENOUS

## 2019-08-15 MED ORDER — HYDROCODONE-ACETAMINOPHEN 5-325 MG PO TABS: 1.0000 | ORAL_TABLET | ORAL | Status: DC | PRN

## 2019-08-15 MED ORDER — ACETAMINOPHEN 325 MG PO TABS
650.0000 mg | ORAL_TABLET | ORAL | Status: DC | PRN
  Administered 2019-08-15: 650 mg via ORAL
  Filled 2019-08-15 (×2): qty 2

## 2019-08-15 MED ORDER — PHENYLEPHRINE 40 MCG/ML (10ML) SYRINGE FOR IV PUSH (FOR BLOOD PRESSURE SUPPORT)
PREFILLED_SYRINGE | INTRAVENOUS | Status: DC | PRN
  Administered 2019-08-15 (×2): 80 ug via INTRAVENOUS

## 2019-08-15 MED ORDER — FENTANYL CITRATE (PF) 100 MCG/2ML IJ SOLN
INTRAMUSCULAR | Status: DC | PRN
  Administered 2019-08-15: 100 ug via INTRAVENOUS
  Administered 2019-08-15: 50 ug via INTRAVENOUS

## 2019-08-15 MED ORDER — PROMETHAZINE HCL 25 MG/ML IJ SOLN: 6.2500 mg | INTRAMUSCULAR | Status: DC | PRN

## 2019-08-15 MED ORDER — BISACODYL 10 MG RE SUPP: 10.0000 mg | Freq: Every day | RECTAL | Status: DC | PRN

## 2019-08-15 MED ORDER — SODIUM CHLORIDE 0.9% FLUSH
3.0000 mL | Freq: Two times a day (BID) | INTRAVENOUS | Status: DC
  Administered 2019-08-15 (×2): 3 mL via INTRAVENOUS

## 2019-08-15 MED ORDER — TOPIRAMATE 25 MG PO TABS
25.0000 mg | ORAL_TABLET | Freq: Two times a day (BID) | ORAL | Status: DC
  Administered 2019-08-15 – 2019-08-16 (×2): 25 mg via ORAL
  Filled 2019-08-15 (×2): qty 1

## 2019-08-15 MED ORDER — ONDANSETRON HCL 4 MG PO TABS: 4.0000 mg | ORAL_TABLET | Freq: Four times a day (QID) | ORAL | Status: DC | PRN

## 2019-08-15 MED ORDER — FIRST-DUKES MOUTHWASH MT SUSP: 5.0000 mL | Freq: Three times a day (TID) | OROMUCOSAL | Status: DC

## 2019-08-15 MED ORDER — ACETAMINOPHEN 500 MG PO TABS
1000.0000 mg | ORAL_TABLET | Freq: Once | ORAL | Status: AC
  Administered 2019-08-15: 1000 mg via ORAL
  Filled 2019-08-15: qty 2

## 2019-08-15 MED ORDER — BUPIVACAINE HCL (PF) 0.5 % IJ SOLN
INTRAMUSCULAR | Status: AC
  Filled 2019-08-15: qty 30

## 2019-08-15 MED ORDER — ESTROVEN ENERGY PO TABS: ORAL_TABLET | Freq: Every day | ORAL | Status: DC

## 2019-08-15 MED ORDER — VITAMIN B-12 1000 MCG PO TABS
6000.0000 ug | ORAL_TABLET | Freq: Every day | ORAL | Status: DC
  Administered 2019-08-16: 6000 ug via ORAL
  Filled 2019-08-15: qty 6

## 2019-08-15 MED ORDER — NICOTINE 21 MG/24HR TD PT24
21.0000 mg | MEDICATED_PATCH | Freq: Every day | TRANSDERMAL | Status: DC
  Administered 2019-08-15: 21 mg via TRANSDERMAL
  Filled 2019-08-15: qty 1

## 2019-08-15 MED ORDER — ADULT MULTIVITAMIN W/MINERALS CH
1.0000 | ORAL_TABLET | Freq: Every day | ORAL | Status: DC
  Administered 2019-08-16: 1 via ORAL
  Filled 2019-08-15: qty 1

## 2019-08-15 MED ORDER — ACYCLOVIR 5 % EX OINT
1.0000 "application " | TOPICAL_OINTMENT | CUTANEOUS | Status: DC | PRN
  Filled 2019-08-15: qty 15

## 2019-08-15 MED ORDER — ALUM & MAG HYDROXIDE-SIMETH 200-200-20 MG/5ML PO SUSP: 30.0000 mL | Freq: Four times a day (QID) | ORAL | Status: DC | PRN

## 2019-08-15 MED ORDER — MIDAZOLAM HCL 2 MG/2ML IJ SOLN
INTRAMUSCULAR | Status: AC
  Filled 2019-08-15: qty 2

## 2019-08-15 MED ORDER — PROPOFOL 10 MG/ML IV BOLUS
INTRAVENOUS | Status: AC
  Filled 2019-08-15: qty 20

## 2019-08-15 SURGICAL SUPPLY — 55 items
BLADE CLIPPER SURG (BLADE) IMPLANT
BUR MATCHSTICK NEURO 3.0 LAGG (BURR) ×2 IMPLANT
BUR ROUND FLUTED 5 RND (BURR) ×2 IMPLANT
CANISTER SUCT 3000ML PPV (MISCELLANEOUS) ×2 IMPLANT
CARTRIDGE OIL MAESTRO DRILL (MISCELLANEOUS) ×1 IMPLANT
COVER WAND RF STERILE (DRAPES) ×2 IMPLANT
DECANTER SPIKE VIAL GLASS SM (MISCELLANEOUS) ×2 IMPLANT
DERMABOND ADVANCED (GAUZE/BANDAGES/DRESSINGS) ×1
DERMABOND ADVANCED .7 DNX12 (GAUZE/BANDAGES/DRESSINGS) ×1 IMPLANT
DIFFUSER DRILL AIR PNEUMATIC (MISCELLANEOUS) ×2 IMPLANT
DRAPE LAPAROTOMY 100X72X124 (DRAPES) ×2 IMPLANT
DRAPE MICROSCOPE LEICA (MISCELLANEOUS) ×2 IMPLANT
DRSG OPSITE POSTOP 4X6 (GAUZE/BANDAGES/DRESSINGS) ×1 IMPLANT
DURAPREP 26ML APPLICATOR (WOUND CARE) ×2 IMPLANT
ELECT REM PT RETURN 9FT ADLT (ELECTROSURGICAL) ×2
ELECTRODE REM PT RTRN 9FT ADLT (ELECTROSURGICAL) ×1 IMPLANT
GAUZE 4X4 16PLY RFD (DISPOSABLE) IMPLANT
GAUZE SPONGE 4X4 12PLY STRL (GAUZE/BANDAGES/DRESSINGS) IMPLANT
GLOVE BIO SURGEON STRL SZ7 (GLOVE) ×1 IMPLANT
GLOVE BIO SURGEON STRL SZ8 (GLOVE) ×2 IMPLANT
GLOVE BIOGEL PI IND STRL 6.5 (GLOVE) IMPLANT
GLOVE BIOGEL PI IND STRL 7.5 (GLOVE) IMPLANT
GLOVE BIOGEL PI IND STRL 8 (GLOVE) ×1 IMPLANT
GLOVE BIOGEL PI IND STRL 8.5 (GLOVE) ×1 IMPLANT
GLOVE BIOGEL PI INDICATOR 6.5 (GLOVE) ×1
GLOVE BIOGEL PI INDICATOR 7.5 (GLOVE) ×2
GLOVE BIOGEL PI INDICATOR 8 (GLOVE) ×1
GLOVE BIOGEL PI INDICATOR 8.5 (GLOVE) ×1
GLOVE ECLIPSE 8.0 STRL XLNG CF (GLOVE) ×2 IMPLANT
GLOVE EXAM NITRILE XL STR (GLOVE) IMPLANT
GOWN STRL REUS W/ TWL LRG LVL3 (GOWN DISPOSABLE) IMPLANT
GOWN STRL REUS W/ TWL XL LVL3 (GOWN DISPOSABLE) IMPLANT
GOWN STRL REUS W/TWL 2XL LVL3 (GOWN DISPOSABLE) ×2 IMPLANT
GOWN STRL REUS W/TWL LRG LVL3 (GOWN DISPOSABLE) ×1
GOWN STRL REUS W/TWL XL LVL3 (GOWN DISPOSABLE) ×1
HEMOSTAT POWDER KIT SURGIFOAM (HEMOSTASIS) ×2 IMPLANT
KIT BASIN OR (CUSTOM PROCEDURE TRAY) ×2 IMPLANT
KIT TURNOVER KIT B (KITS) ×2 IMPLANT
NDL HYPO 25X1 1.5 SAFETY (NEEDLE) ×1 IMPLANT
NDL SPNL 18GX3.5 QUINCKE PK (NEEDLE) ×1 IMPLANT
NEEDLE HYPO 25X1 1.5 SAFETY (NEEDLE) ×2 IMPLANT
NEEDLE SPNL 18GX3.5 QUINCKE PK (NEEDLE) ×2 IMPLANT
NS IRRIG 1000ML POUR BTL (IV SOLUTION) ×2 IMPLANT
OIL CARTRIDGE MAESTRO DRILL (MISCELLANEOUS) ×2
PACK LAMINECTOMY NEURO (CUSTOM PROCEDURE TRAY) ×2 IMPLANT
PAD ARMBOARD 7.5X6 YLW CONV (MISCELLANEOUS) ×3 IMPLANT
SPONGE SURGIFOAM ABS GEL SZ50 (HEMOSTASIS) IMPLANT
STAPLER SKIN PROX WIDE 3.9 (STAPLE) IMPLANT
SUT VIC AB 0 CT1 18XCR BRD8 (SUTURE) ×1 IMPLANT
SUT VIC AB 0 CT1 8-18 (SUTURE) ×1
SUT VIC AB 2-0 CT1 18 (SUTURE) ×2 IMPLANT
SUT VIC AB 3-0 SH 8-18 (SUTURE) ×2 IMPLANT
TOWEL GREEN STERILE (TOWEL DISPOSABLE) ×2 IMPLANT
TOWEL GREEN STERILE FF (TOWEL DISPOSABLE) ×2 IMPLANT
WATER STERILE IRR 1000ML POUR (IV SOLUTION) ×2 IMPLANT

## 2019-08-15 NOTE — Transfer of Care (Signed)
Immediate Anesthesia Transfer of Care Note  Patient: Gabrielle Pineda  Procedure(s) Performed: Lumbar three to Sacral one Decompressive lumbar laminectomy (N/A )  Patient Location: PACU  Anesthesia Type:General  Level of Consciousness: awake, alert , oriented and patient cooperative  Airway & Oxygen Therapy: Patient Spontanous Breathing and Patient connected to nasal cannula oxygen  Post-op Assessment: Report given to RN and Post -op Vital signs reviewed and stable  Post vital signs: Reviewed and stable  Last Vitals:  Vitals Value Taken Time  BP 109/65 08/15/19 1038  Temp 36.4 C 08/15/19 0938  Pulse 75 08/15/19 1043  Resp 15 08/15/19 1043  SpO2 89 % 08/15/19 1043  Vitals shown include unvalidated device data.  Last Pain:  Vitals:   08/15/19 1024  TempSrc:   PainSc: 9       Patients Stated Pain Goal: 4 ([id number])  Complications: No apparent anesthesia complications

## 2019-08-15 NOTE — Op Note (Signed)
08/15/2019  9:36 AM  PATIENT:  Gabrielle Pineda  63 y.o. female  PRE-OPERATIVE DIAGNOSIS: Degenerative Lumbar stenosis with neurogenic claudication, lumbar radiculopathy, lumbago, degererative disc disease, spondylosis L 3 - S 1 levels  POST-OPERATIVE DIAGNOSIS: Degenerative Lumbar stenosis with neurogenic claudication, lumbar radiculopathy, lumbago, degererative disc disease, spondylosis L 3 - S 1 levels  PROCEDURE:  Procedure(s): Lumbar three to Sacral one Decompressive lumbar laminectomy (N/A)  SURGEON:  Surgeon(s) and Role:    Erline Levine, MD - Primary  PHYSICIAN ASSISTANT:   ASSISTANTS: Poteat, RN   ANESTHESIA:   general  EBL:  125 mL   BLOOD ADMINISTERED:none  DRAINS: none   LOCAL MEDICATIONS USED:  MARCAINE    and LIDOCAINE   SPECIMEN:  No Specimen  DISPOSITION OF SPECIMEN:  N/A  COUNTS:  YES  TOURNIQUET:  * No tourniquets in log *  DICTATION: Patient has spinal stenosis at L 34, 45, L 5 S 1 with bilateral weakness and intractable pain. It was elected to take hier to surgery for L 3 through S 1 laminectomy.  Procedure: Patient was brought to the operating room and following the smooth and uncomplicated induction of general endotracheal anesthesia she was placed in a prone position on the Wilson frame. Low back was prepped and draped in the usual sterile fashion with betadine scrub and DuraPrep. Area of planned incision was infiltrated with local lidocaine. Incision was made in the midline and carried to the lumbodorsal fascia which was incised on the right side of midline. Subperiosteal dissection was performed exposing what was felt to be L45 level. Intraoperative x-ray demonstrated marker probes at L 34, L 45, L 5 S 1 levels. A total laminectomy of L 3, L 4 and L 5 was performed with leksell, then a high-speed drill and completed with Kerrison rongeurs and generous foraminotomies were performed to decompress the L 3, L 4, and L 5, S 1  nerves bilaterally.  Ligamentum flavum was detached and removed in a piecemeal fashion and the bilateral  L 4 and L 5 nerve roots were decompressed laterally with removal of the superior aspect of the facet and ligamentum causing nerve root compression. Angled curettes were used to detatch and then remove thickened compressive ligamentous material.  At this point it was felt that all neural elements were well decompressed. The wound was then irrigated with saline. Hemostasis was assured with Surgifoam. The lumbodorsal fascia was closed with 0 Vicryl sutures the subcutaneous tissues reapproximated 2-0 Vicryl inverted sutures and the skin edges were reapproximated with 3-0 Vicryl subcuticular stitch. The wound is dressed with Dermabond and an occlusive dressing. Patient was extubated in the operating room and taken to recovery in stable and satisfactory condition having tolerated his operation well counts were correct at the end of the case.    PLAN OF CARE: Admit to inpatient   PATIENT DISPOSITION:  PACU - hemodynamically stable.   Delay start of Pharmacological VTE agent (>24hrs) due to surgical blood loss or risk of bleeding: yes

## 2019-08-15 NOTE — Brief Op Note (Signed)
08/15/2019  9:36 AM  PATIENT:  Gabrielle Pineda  63 y.o. female  PRE-OPERATIVE DIAGNOSIS: Degenerative Lumbar stenosis with neurogenic claudication, lumbar radiculopathy, lumbago, degererative disc disease, spondylosis L 3 - S 1 levels  POST-OPERATIVE DIAGNOSIS: Degenerative Lumbar stenosis with neurogenic claudication, lumbar radiculopathy, lumbago, degererative disc disease, spondylosis L 3 - S 1 levels  PROCEDURE:  Procedure(s): Lumbar three to Sacral one Decompressive lumbar laminectomy (N/A)  SURGEON:  Surgeon(s) and Role:    Erline Levine, MD - Primary  PHYSICIAN ASSISTANT:   ASSISTANTS: Poteat, RN   ANESTHESIA:   general  EBL:  125 mL   BLOOD ADMINISTERED:none  DRAINS: none   LOCAL MEDICATIONS USED:  MARCAINE    and LIDOCAINE   SPECIMEN:  No Specimen  DISPOSITION OF SPECIMEN:  N/A  COUNTS:  YES  TOURNIQUET:  * No tourniquets in log *  DICTATION: Patient has spinal stenosis at L 34, 45, L 5 S 1 with bilateral weakness and intractable pain. It was elected to take hier to surgery for L 3 through S 1 laminectomy.  Procedure: Patient was brought to the operating room and following the smooth and uncomplicated induction of general endotracheal anesthesia she was placed in a prone position on the Wilson frame. Low back was prepped and draped in the usual sterile fashion with betadine scrub and DuraPrep. Area of planned incision was infiltrated with local lidocaine. Incision was made in the midline and carried to the lumbodorsal fascia which was incised on the right side of midline. Subperiosteal dissection was performed exposing what was felt to be L45 level. Intraoperative x-ray demonstrated marker probes at L 34, L 45, L 5 S 1 levels. A total laminectomy of L 3, L 4 and L 5 was performed with leksell, then a high-speed drill and completed with Kerrison rongeurs and generous foraminotomies were performed to decompress the L 3, L 4, and L 5, S 1  nerves bilaterally.  Ligamentum flavum was detached and removed in a piecemeal fashion and the bilateral  L 4 and L 5 nerve roots were decompressed laterally with removal of the superior aspect of the facet and ligamentum causing nerve root compression. Angled curettes were used to detatch and then remove thickened compressive ligamentous material.  At this point it was felt that all neural elements were well decompressed. The wound was then irrigated with saline. Hemostasis was assured with Surgifoam. The lumbodorsal fascia was closed with 0 Vicryl sutures the subcutaneous tissues reapproximated 2-0 Vicryl inverted sutures and the skin edges were reapproximated with 3-0 Vicryl subcuticular stitch. The wound is dressed with Dermabond and an occlusive dressing. Patient was extubated in the operating room and taken to recovery in stable and satisfactory condition having tolerated his operation well counts were correct at the end of the case.    PLAN OF CARE: Admit to inpatient   PATIENT DISPOSITION:  PACU - hemodynamically stable.   Delay start of Pharmacological VTE agent (>24hrs) due to surgical blood loss or risk of bleeding: yes

## 2019-08-15 NOTE — Progress Notes (Addendum)
Patient ID: Gabrielle Pineda, female   DOB: [birth year], 63 y.o.   MRN: [id number] Alert and conversant, reporting only lumbar incisional pain controlled with Norco. Has ambulated in hallway x2. Good strength BLE. No pain or numbness buttocks or legs. Incision without erythema, swelling, or drainage beneath honeycomb and Dermabond.  Patient is doing well.

## 2019-08-15 NOTE — Evaluation (Signed)
Physical Therapy Evaluation Patient Details Name: Gabrielle Pineda MRN: [id number] DOB: [birth year] Today's Date: 08/15/2019   History of Present Illness  63 yo female s/p L3-S1 decompressive lumbar laminectomy.  Clinical Impression   Pt presents with moderate to severe back pain during ambulation, decreased knowledge of back precautions, increased time and effort to mobilize, and decreased activity tolerance post-operatively. Pt to benefit from acute PT to address deficits. Pt ambulated hallway distance with min guard assist, with occasional steadying assist required. Pt with no equipment needs, will d/c home with assist of husband as needed. PT to progress mobility as tolerated, and will continue to follow acutely.      Follow Up Recommendations Follow surgeon's recommendation for DC plan and follow-up therapies;Supervision for mobility/OOB    Equipment Recommendations  None recommended by PT    Recommendations for Other Services       Precautions / Restrictions Precautions Precautions: Fall;Back Precaution Booklet Issued: Yes (comment) Precaution Comments: verbal cuing for no bending, lifting, twisting, arching; pt able to teach back no bending, lifting, twisting and demonstrated appropriate log roll technique Restrictions Weight Bearing Restrictions: No      Mobility  Bed Mobility Overal bed mobility: Needs Assistance Bed Mobility: Sit to Supine       Sit to supine: Min guard   General bed mobility comments: pt sitting EOB upon PT arrival to room; min guard for sit to supine for log roll technique and reinforcing back precautions.  Transfers Overall transfer level: Needs assistance Equipment used: None Transfers: Sit to/from Stand Sit to Stand: Min guard         General transfer comment: min gaurd for safety, pt with appropriate bending at hips/knees and not spine  Ambulation/Gait Ambulation/Gait assistance: Min guard Gait Distance (Feet): 250  Feet Assistive device: None Gait Pattern/deviations: Step-through pattern;Decreased stride length;Trunk flexed Gait velocity: decr   General Gait Details: min gaurd for safety, pt with occasional use of hallway rails to steady self. Verbal cuing for upright posture and turning as a unit during change in directions (to diminish twisting through spine)  Stairs            Wheelchair Mobility    Modified Rankin (Stroke Patients Only)       Balance Overall balance assessment: Mild deficits observed, not formally tested                                           Pertinent Vitals/Pain Pain Assessment: 0-10 Pain Score: 9  Pain Location: back Pain Descriptors / Indicators: Sore;Discomfort;Spasm Pain Intervention(s): Limited activity within patient's tolerance;Monitored during session;Repositioned;RN gave pain meds during session    Home Living Family/patient expects to be discharged to:: Private residence Living Arrangements: Spouse/significant other Available Help at Discharge: Family Type of Home: House Home Access: Stairs to enter   Technical brewer of Steps: 3 Home Layout: One level Home Equipment: Shower seat;Bedside commode      Prior Function Level of Independence: Independent               Hand Dominance   Dominant Hand: Right    Extremity/Trunk Assessment   Upper Extremity Assessment Upper Extremity Assessment: Overall WFL for tasks assessed    Lower Extremity Assessment Lower Extremity Assessment: Overall WFL for tasks assessed    Cervical / Trunk Assessment Cervical / Trunk Assessment: Normal  Communication   Communication: No difficulties  Cognition Arousal/Alertness: Awake/alert Behavior During Therapy: WFL for tasks assessed/performed Overall Cognitive Status: Within Functional Limits for tasks assessed                                        General Comments      Exercises Total Joint  Exercises Long Arc Quad: AROM;Both;5 reps;Seated   Assessment/Plan    PT Assessment Patient needs continued PT services  PT Problem List Decreased strength;Decreased mobility;Decreased safety awareness;Decreased knowledge of precautions;Decreased activity tolerance;Decreased balance;Decreased knowledge of use of DME;Pain       PT Treatment Interventions DME instruction;Therapeutic activities;Gait training;Therapeutic exercise;Patient/family education;Balance training;Stair training;Functional mobility training    PT Goals (Current goals can be found in the Care Plan section)  Acute Rehab PT Goals Patient Stated Goal: go home PT Goal Formulation: With patient Time For Goal Achievement: 08/29/19 Potential to Achieve Goals: Good    Frequency Min 5X/week   Barriers to discharge        Co-evaluation               AM-PAC PT "6 Clicks" Mobility  Outcome Measure Help needed turning from your back to your side while in a flat bed without using bedrails?: A Little Help needed moving from lying on your back to sitting on the side of a flat bed without using bedrails?: A Little Help needed moving to and from a bed to a chair (including a wheelchair)?: A Little Help needed standing up from a chair using your arms (e.g., wheelchair or bedside chair)?: A Little Help needed to walk in hospital room?: A Little Help needed climbing 3-5 steps with a railing? : A Little 6 Click Score: 18    End of Session Equipment Utilized During Treatment: Gait belt Activity Tolerance: Patient tolerated treatment well;Patient limited by pain Patient left: in bed;with call bell/phone within reach;with nursing/sitter in room;with SCD's reapplied Nurse Communication: Mobility status PT Visit Diagnosis: Other abnormalities of gait and mobility (R26.89);Pain Pain - Right/Left: (low) Pain - part of body: (back)    Time: [id number] PT Time Calculation (min) (ACUTE ONLY): 20 min   Charges:   PT  Evaluation $PT Eval Low Complexity: 1 Low         Jayshaun Phillips E, PT Acute Rehabilitation Services Pager [phone]  Office [phone]  Zackeriah Kissler D Elonda Husky 08/15/2019, 4:45 PM

## 2019-08-15 NOTE — Anesthesia Postprocedure Evaluation (Signed)
Anesthesia Post Note  Patient: Alyzae Z Gatchell  Procedure(s) Performed: Lumbar three to Sacral one Decompressive lumbar laminectomy (N/A )     Patient location during evaluation: PACU Anesthesia Type: General Level of consciousness: sedated Pain management: pain level controlled Vital Signs Assessment: post-procedure vital signs reviewed and stable Respiratory status: spontaneous breathing and respiratory function stable Cardiovascular status: stable Postop Assessment: no apparent nausea or vomiting Anesthetic complications: no    Last Vitals:  Vitals:   08/15/19 1038 08/15/19 1059  BP: 109/65 136/73  Pulse: 74 85  Resp: 15 16  Temp: (!) 36.3 C 36.6 C  SpO2: 92% 93%    Last Pain:  Vitals:   08/15/19 1059  TempSrc: Oral  PainSc:                  Guenevere Roorda DANIEL

## 2019-08-15 NOTE — H&P (Signed)
Patient ID:   [id number] Patient: Gabrielle Pineda  Date of Birth: [birth year] Visit Type: Office Visit   Date: 06/07/2019 10:45 AM Provider: Marchia Meiers. Vertell Limber MD   This 63 year old female presents for back pain.  HISTORY OF PRESENT ILLNESS:  1.  back pain  Patient returns for evaluation of lumbar and bilateral leg pain increasing since April.  She recalls no injury.  Currently taking no pain medication.   Do MRI on canopy   the patient had cervical radiographs and lumbar radiographs which I reviewed today.  There is no acute pathology in her cervical spine and she has had multiple previous cervical spinal surgeries.  She does not appear to have instability on flexion and extension views of her cervical spine.  Her lumbar radiographs show no instability.    Lumbar MRI demonstrates significant spinal stenosis L3 through S1 levels.    The patient complains of neurogenic claudication which is not improving with conservative management including physical therapy  And injections.   the spinal stenosis is fairly marked.  The patient is increasingly limited in terms of exercise tolerance and has to sit down because her legs for after walking a short distance.  The patient is complaining of pain in both of her hips with weakness into both her legs.  Do she says that her neck is giving her trouble as well.  This has not progressed.  The patient notes a history of rheumatoid and osteoarthritis.  She smokes 1 pack per day of cigarettes.  She notes neuropathy due to both her feet.  The patient describes severe low back and bilateral buttock pain and is only able to be up for 5 minutes at a time.    Lumbar imaging demonstrates spinal stenosis which is moderately severe at the L5-S1 level and markedly severe at L4-5 level and mildly severe at the L3-4 level. her cervical imaging demonstrates previous cervical fusion surgery C3-4, C4 through C6, C6-T1 levels with questionable nonunion.    Her lumbar  images include plain radiographs which do not demonstrate any instability with flexion extension views.    On examination today the patient has bilateral sciatic notch discomfort.  She is able to bend within 6 in of floor with her upper extremities outstretched.  She has the ability to stand her toes and on her heels.  She appears to have full strength in both lower extremities.         Medical/Surgical/Interim History Reviewed, no change.  Last detailed document date:06/29/2016.     PAST MEDICAL HISTORY, SURGICAL HISTORY, FAMILY HISTORY, SOCIAL HISTORY AND REVIEW OF SYSTEMS I have reviewed the patient's past medical, surgical, family and social history as well as the comprehensive review of systems as included on the [facility] history form dated 06/07/2019, which I have signed.  Family History:  Reviewed, no changes.  Last detailed document date:03/08/2013.   Social History: Reviewed, no changes. Last detailed document date: 03/08/2013.    MEDICATIONS: (added, continued or stopped this visit) Started Medication Directions Instruction Stopped   acyclovir 5 % topical cream      Anoro Ellipta 62.5 mcg-25 mcg/actuation powder for inhalation      aripiprazole 10 mg tablet      atorvastatin 40 mg tablet      B-Complex tablet one tablet by mouth daily.     buspirone 30 mg tablet      diclofenac 1 % topical gel      diltiazem ER 180 mg  capsule,24 hr,extended release      [employer] 56 mg-40 mg-130 mg tablet      ezetimibe 10 mg tablet      fluticasone propionate 50 mcg/actuation nasal spray,suspension      folic acid 1 mg tablet take 1 tablet by oral route  every day     ibuprofen 200 mg tablet take 1 tablet by oral route  every 6 hours as needed with food     ipratropium bromide 0.02 % solution for inhalation      leflunomide 10 mg tablet      Lipitor  ORAL      meloxicam 15 mg tablet take 1 tablet by oral route  every day    01/07/2017  METHOCARBAM 500MG    TAB TAKE 1 TABLET BY MOUTH THREE TIMES DAILY AS NEEDED     methotrexate sodium 2.5 mg tablet take 1 tablet by oral route  every 12 hours for 3 doses given as a course once weekly     multivitamin tablet one tablet by mouth daily.     omeprazole 20 mg tablet,delayed release      pantoprazole 40 mg tablet,delayed release      potassium chloride ER 20 mEq tablet,extended release      sertraline 100 mg tablet take 1 tablet by oral route  every day    06/07/2019 tramadol 50 mg tablet take 1 tablet by oral route  every 6 hours as needed     trazodone 50 mg tablet take 1 tablet by oral route  every bedtime     Trokendi XR 25 mg capsule,extended release      Valtrex 500 mg tablet take 1 tablet by oral route 4 times every day     venlafaxine ER 75 mg tablet,extended release 24 hr take 1 tablet by oral route  every day in the morning at the same time each day with food     Xiidra 5 % eye drops in a dropperette        ALLERGIES: Ingredient Reaction Medication Name Comment  HYDROXYCHLOROQUINE     HYDROXYCHLOROQUINE SULFATE  Plaquenil    Reviewed, updated.    PHYSICAL EXAM:   Vitals Date Temp F BP Pulse Ht In Wt Lb BMI BSA Pain Score  06/07/2019  138/84 91 69 151 22.3  9/10      IMPRESSION:     Patient has neurogenic claudication and severe spinal stenosis refractory to conservative management.  She has severe spinal stenosis at L4-5 and to a lesser degree at L5-S1 and L3-4 levels.  She is only able to stand for 5 minutes at a time.  PLAN:   I have recommended proceeding with decompressive lumbar laminectomy L3 through S1 levels.  Patient wishes to proceed with surgery.  Details of the procedure and patient education was today.  The patient understands the risks and benefits of surgery and wishes to proceed  Orders: Diagnostic Procedures: Assessment Procedure  M54.12 Cervical Spine- AP/Lat/Flex/Ex  M54.16 Lumbar Spine- AP/Lat/Flex/Ex   Instruction(s)/Education: Assessment Instruction   Tobacco cessation counseling  I10 Lifestyle education   Completed Orders (this encounter) Order Details Reason Side Interpretation Result Initial Treatment Date Region  Lifestyle education Patient will follow up with primary care physician.        Cervical Spine- AP/Lat/Flex/Ex W/SWIMMER'S     06/07/2019   Lumbar Spine- AP/Lat/Flex/Ex      06/07/2019   Tobacco cessation counseling          Assessment/Plan   #  Detail Type Description   1. Assessment Low back pain, unspecified back pain laterality, with sciatica presence unspecified (M54.5).       2. Assessment Radiculopathy, lumbar region (M54.16).       3. Assessment Radiculopathy, cervical region (M54.12).       4. Assessment Lumbar stenosis with neurogenic claudication (M48.062).       5. Assessment Essential (primary) hypertension (I10).         Pain Management Plan Pain Scale: 9/10. Method: Numeric Pain Intensity Scale. Location: back. Onset: 03/02/2014. Duration: varies. Quality: discomforting. Pain management follow-up plan of care: Patient will continue medication management.     MEDICATIONS PRESCRIBED TODAY    Rx Quantity Refills  TRAMADOL HCL 50 mg  60 0            Provider:  Marchia Meiers. Vertell Limber MD  06/17/2019 02:58 PM    Dictation edited by: Marchia Meiers. Vertell Limber    CC Providers: Reginia Forts [facility] [place],  [place]  [id number]-   Don Noah  [address] [place], [place] [address]-               Electronically signed by Marchia Meiers Vertell Limber MD on 06/17/2019 02:58 PM

## 2019-08-15 NOTE — Interval H&P Note (Signed)
History and Physical Interval Note:  08/15/2019 7:37 AM  Gabrielle Pineda  has presented today for surgery, with the diagnosis of Lumbar stenosis with neurogenic claudication.  The various methods of treatment have been discussed with the patient and family. After consideration of risks, benefits and other options for treatment, the patient has consented to  Procedure(s) with comments: Lumbar 3 to Sacral 1 Decompressive lumbar laminectomy (N/A) - Lumbar 3 to Sacral 1 Decompressive lumbar laminectomy as a surgical intervention.  The patient's history has been reviewed, patient examined, no change in status, stable for surgery.  I have reviewed the patient's chart and labs.  Questions were answered to the patient's satisfaction.     [name]

## 2019-08-15 NOTE — Plan of Care (Signed)
Adequately Progressing well

## 2019-08-15 NOTE — Progress Notes (Signed)
Awake, alert, conversant.  MAEW with good power (full PF/DF/EHL).  Minimal discomfort.  Doing well.

## 2019-08-16 MED ORDER — OXYCODONE HCL 5 MG PO TABS: 5.0000 mg | ORAL_TABLET | ORAL | 0 refills | Status: DC | PRN

## 2019-08-16 MED ORDER — MELOXICAM 15 MG PO TABS: 15.0000 mg | ORAL_TABLET | Freq: Every day | ORAL | Status: DC

## 2019-08-16 MED ORDER — METHOCARBAMOL 750 MG PO TABS: 750.0000 mg | ORAL_TABLET | Freq: Three times a day (TID) | ORAL | 1 refills | Status: DC | PRN

## 2019-08-16 MED ORDER — IBUPROFEN 200 MG PO TABS: 600.0000 mg | ORAL_TABLET | Freq: Three times a day (TID) | ORAL | 0 refills | Status: DC | PRN

## 2019-08-16 NOTE — Evaluation (Signed)
Occupational Therapy Evaluation Patient Details Name: Gabrielle Pineda MRN: [id number] DOB: Oct 27, 1955 Today's Date: 08/16/2019    History of Present Illness 63 yo female s/p L3-S1 decompressive lumbar laminectomy.   Clinical Impression   Pt PTA: Pt living with spouse and reports independent with ADL and mobility. Pt currently performing ADL tasks modified independent with use of figure 4 technique for LB ADL and no difficulty with UB ADL. Pt performing ADL routine with no verbal cues required to abide by back precautions.  Back handout provided and reviewed ADL in detail. Pt educated on:set an alarm at night for medication, avoid sitting for long periods of time, correct bed positioning for sleeping, correct sequence for bed mobility, avoiding lifting more than 5 pounds and never wash directly over incision. All education is complete and patient indicates understanding. Pt does not require continued OT skilled services. OT signing off.      Follow Up Recommendations  No OT follow up    Equipment Recommendations  None recommended by OT    Recommendations for Other Services       Precautions / Restrictions Precautions Precautions: Fall;Back Precaution Booklet Issued: Yes (comment) Precaution Comments: Pt able to state precautions Restrictions Weight Bearing Restrictions: No      Mobility Bed Mobility Overal bed mobility: Needs Assistance Bed Mobility: Sit to Supine;Supine to Sit     Supine to sit: Supervision Sit to supine: Supervision   General bed mobility comments: Pt supervisionA for log rolling  Transfers Overall transfer level: Needs assistance Equipment used: None Transfers: Sit to/from Stand Sit to Stand: Modified independent (Device/Increase time)         General transfer comment: no physical assist    Balance Overall balance assessment: Mild deficits observed, not formally tested                                         ADL  either performed or assessed with clinical judgement   ADL Overall ADL's : At baseline;Modified independent                                       General ADL Comments: Pt modified independent for self care with use of figure 4 technique for LB ADL and no difficulty with UB ADL.      Vision Baseline Vision/History: Wears glasses Wears Glasses: At all times Patient Visual Report: No change from baseline Vision Assessment?: No apparent visual deficits     Perception     Praxis      Pertinent Vitals/Pain Pain Assessment: 0-10 Pain Score: 3  Pain Location: back Pain Descriptors / Indicators: Sore;Discomfort Pain Intervention(s): Limited activity within patient's tolerance     Hand Dominance Right   Extremity/Trunk Assessment Upper Extremity Assessment Upper Extremity Assessment: Generalized weakness   Lower Extremity Assessment Lower Extremity Assessment: Generalized weakness;Defer to PT evaluation   Cervical / Trunk Assessment Cervical / Trunk Assessment: Other exceptions Cervical / Trunk Exceptions: s/p back sx   Communication Communication Communication: No difficulties   Cognition Arousal/Alertness: Awake/alert Behavior During Therapy: WFL for tasks assessed/performed Overall Cognitive Status: Within Functional Limits for tasks assessed  General Comments       Exercises     Shoulder Instructions      Home Living Family/patient expects to be discharged to:: Private residence Living Arrangements: Spouse/significant other Available Help at Discharge: Family Type of Home: House Home Access: Stairs to enter Technical brewer of Steps: 3   Home Layout: One level     Bathroom Shower/Tub: Occupational psychologist: Standard     Home Equipment: Shower seat;Bedside commode          Prior Functioning/Environment Level of Independence: Independent                 OT  Problem List: Decreased strength;Decreased activity tolerance;Impaired balance (sitting and/or standing);Decreased safety awareness;Pain      OT Treatment/Interventions:      OT Goals(Current goals can be found in the care plan section) Acute Rehab OT Goals Patient Stated Goal: go home OT Goal Formulation: With patient Time For Goal Achievement: 08/30/19 Potential to Achieve Goals: Good  OT Frequency:     Barriers to D/C:            Co-evaluation              AM-PAC OT "6 Clicks" Daily Activity     Outcome Measure Help from another person eating meals?: None Help from another person taking care of personal grooming?: None Help from another person toileting, which includes using toliet, bedpan, or urinal?: None Help from another person bathing (including washing, rinsing, drying)?: A Little Help from another person to put on and taking off regular upper body clothing?: None Help from another person to put on and taking off regular lower body clothing?: None 6 Click Score: 23   End of Session Nurse Communication: Mobility status  Activity Tolerance: Patient tolerated treatment well Patient left: in bed;with call bell/phone within reach  OT Visit Diagnosis: Muscle weakness (generalized) (M62.81);Pain Pain - part of body: (back)                Time: [id number] OT Time Calculation (min): 20 min Charges:  OT General Charges $OT Visit: 1 Visit OT Evaluation $OT Eval Moderate Complexity: 1 Mod  Darryl Nestle) Marsa Aris OTR/L Acute Rehabilitation Services Pager: [phone] Office: [place] 08/16/2019, 8:34 AM

## 2019-08-16 NOTE — Progress Notes (Signed)
Patient alert and oriented, mae's well, voiding adequate amount of urine, swallowing without difficulty, no c/o pain at time of discharge. Patient discharged home with family. Script and discharged instructions given to patient. Patient and family stated understanding of instructions given. Patient has an appointment with Dr.Stern    

## 2019-08-16 NOTE — Discharge Summary (Signed)
Physician Discharge Summary  Patient ID: Gabrielle Pineda MRN: [id number] DOB/AGE: 1955-11-27 63 y.o.  Admit date: 08/15/2019 Discharge date: 08/16/2019  Admission Diagnoses:  Lumbar spinal stenosis  Discharge Diagnoses:  Same Active Problems:   Degenerative lumbar spinal stenosis   Discharged Condition: Stable  Hospital Course:  Gabrielle Pineda is a 63 y.o. female who was admitted for the below procedure. There were no post operative complications. At time of discharge, pain was well controlled, ambulating with Pt/OT, tolerating po, voiding normal. Ready for discharge.  Treatments: Surgery Lumbar three to Sacral one Decompressive lumbar laminectomy   Discharge Exam: Blood pressure 98/60, pulse 74, temperature 98.4 F (36.9 C), temperature source Oral, resp. rate 18, height 5\' 9"  (1.753 m), weight 68 kg, SpO2 92 %. Awake, alert, oriented Speech fluent, appropriate CN grossly intact 5/5 BUE/BLE Wound c/d/i  Disposition: Discharge disposition: 01-Home or Self Care       Discharge Instructions    Call MD for:  difficulty breathing, headache or visual disturbances   Complete by: As directed    Call MD for:  persistant dizziness or light-headedness   Complete by: As directed    Call MD for:  redness, tenderness, or signs of infection (pain, swelling, redness, odor or green/yellow discharge around incision site)   Complete by: As directed    Call MD for:  severe uncontrolled pain   Complete by: As directed    Call MD for:  temperature >100.4   Complete by: As directed    Diet general   Complete by: As directed    Driving Restrictions   Complete by: As directed    Do not drive until given clearance.   Increase activity slowly   Complete by: As directed    Lifting restrictions   Complete by: As directed    Do not lift anything >10lbs. Avoid bending and twisting in awkward positions. Avoid bending at the back.   May shower / Bathe   Complete by: As directed    In 24 hours. Okay to wash wound with warm soapy water. Avoid scrubbing the wound. Pat dry.   Remove dressing in 24 hours   Complete by: As directed      Allergies as of 08/16/2019      Reactions   Plaquenil [hydroxychloroquine] Rash      Medication List    STOP taking these medications   traMADol 50 MG tablet Commonly known as: ULTRAM     TAKE these medications   acyclovir cream 5 % Commonly known as: ZOVIRAX APPLY TOPICALLY EVERY 4 HOURS What changed: See the new instructions.   Anoro Ellipta 62.5-25 MCG/INH Aepb Generic drug: umeclidinium-vilanterol Inhale 1 puff into the lungs daily. What changed: when to take this   ARIPiprazole 10 MG tablet Commonly known as: ABILIFY Take 1 tablet by mouth once daily   atorvastatin 40 MG tablet Commonly known as: LIPITOR Take 1 tablet by mouth once daily   busPIRone 30 MG tablet Commonly known as: BUSPAR TAKE 1 TABLET BY MOUTH THREE TIMES DAILY   Cartia XT 180 MG 24 hr capsule Generic drug: diltiazem Take 1 capsule by mouth once daily What changed: how much to take   ESTROVEN ENERGY PO Take 1 tablet by mouth daily.   ezetimibe 10 MG tablet Commonly known as: ZETIA Take 1 tablet by mouth once daily   First-Dukes Mouthwash Susp Take 5 mLs by mouth 3 (three) times daily. Swish and spit   fluticasone 50 MCG/ACT nasal spray Commonly  known as: FLONASE Place 2 sprays into both nostrils daily.   furosemide 20 MG tablet Commonly known as: LASIX TAKE 1 TABLET BY MOUTH DAILY AS NEEDED FOR  FLUID What changed: See the new instructions.   gabapentin 600 MG tablet Commonly known as: NEURONTIN 600 mg 3 times daily.  Okay to increase nighttime dose to 1200 mg. What changed:   how much to take  how to take this  when to take this   ibuprofen 200 MG tablet Commonly known as: ADVIL Take 3 tablets (600 mg total) by mouth every 8 (eight) hours as needed. For pain Start taking on: August 22, 2019 What changed: These  instructions start on August 22, 2019. If you are unsure what to do until then, ask your doctor or other care provider.   ipratropium 0.02 % nebulizer solution Commonly known as: ATROVENT Take 2.5 mLs (0.5 mg total) by nebulization 4 (four) times daily. What changed:   when to take this  reasons to take this   leflunomide 10 MG tablet Commonly known as: ARAVA Take 10 mg by mouth at bedtime.   Linzess 145 MCG Caps capsule Generic drug: linaclotide Take 145 mcg by mouth daily before breakfast.   meloxicam 15 MG tablet Commonly known as: MOBIC Take 1 tablet (15 mg total) by mouth at bedtime. Start taking on: August 22, 2019 What changed: These instructions start on August 22, 2019. If you are unsure what to do until then, ask your doctor or other care provider.   methocarbamol 750 MG tablet Commonly known as: Robaxin-750 Take 1 tablet (750 mg total) by mouth 3 (three) times daily as needed for muscle spasms.   Mucinex Maximum Strength 1200 MG Tb12 Generic drug: Guaifenesin Take 1,200 mg by mouth daily.   multivitamin with minerals Tabs tablet Take 1 tablet by mouth daily.   nicotine 21 mg/24hr patch Commonly known as: NICODERM CQ - dosed in mg/24 hours Place 21 mg onto the skin daily.   omeprazole 40 MG capsule Commonly known as: PRILOSEC Take 1 capsule (40 mg total) by mouth 2 (two) times daily.   oxyCODONE 5 MG immediate release tablet Commonly known as: Roxicodone Take 1 tablet (5 mg total) by mouth every 4 (four) hours as needed for severe pain.   pantoprazole 40 MG tablet Commonly known as: PROTONIX Take 40 mg by mouth daily before breakfast.   potassium chloride SA 20 MEQ tablet Commonly known as: KLOR-CON TAKE 1  BY MOUTH ONCE DAILY TAKE  WITH  FUROSEMIDE What changed: See the new instructions.   sertraline 100 MG tablet Commonly known as: ZOLOFT Take 1 tablet by mouth once daily What changed: when to take this   topiramate 25 MG tablet Commonly  known as: Topamax Take 1 tablet (25 mg total) by mouth 2 (two) times daily.   traZODone 100 MG tablet Commonly known as: DESYREL Take 2 tablets (200 mg total) by mouth at bedtime.   valACYclovir 1000 MG tablet Commonly known as: VALTREX TAKE 2 TABLETS BY MOUTH TWICE DAILY FOR 5 DAYS AS NEEDED What changed:   how much to take  how to take this  when to take this  reasons to take this  additional instructions   venlafaxine XR 75 MG 24 hr capsule Commonly known as: EFFEXOR-XR TAKE 3 CAPSULES BY MOUTH ONCE DAILY WITH BREAKFAST What changed: See the new instructions.   vitamin B-12 1000 MCG tablet Commonly known as: CYANOCOBALAMIN Take 6,000 mcg by mouth daily.   vitamin  E 400 UNIT capsule Take 1,600 Units by mouth daily.   Xiidra 5 % Soln Generic drug: Lifitegrast Place 1 drop into both eyes daily.      Follow-up Information    [name], MD. Schedule an appointment as soon as possible for a visit.   Specialty: Neurosurgery Contact information: [address] [place] [address] [phone]           Signed: Alyson Ingles 08/16/2019, 8:35 AM

## 2019-08-16 NOTE — Progress Notes (Signed)
  NEUROSURGERY PROGRESS NOTE   No issues overnight.  Complains of appropriate back soreness, although well controlled Pre op sx resolved Voiding normal. Tolerating po Ambulating well   EXAM:  BP 98/60 (BP Location: Right Arm)   Pulse 74   Temp 98.4 F (36.9 C) (Oral)   Resp 18   Ht 5\' 9"  (1.753 m)   Wt 68 kg   SpO2 92%   BMI 22.15 kg/m   Awake, alert, oriented  Speech fluent, appropriate  CN grossly intact  MAEW, stable motor Incision: c/d/i  IMPRESSION/PLAN 63 y.o. female POD s/p L3-S1 laminectomy. Doing well - d/c home

## 2019-08-16 NOTE — Progress Notes (Addendum)
Physical Therapy Treatment Patient Details Name: Gabrielle Pineda MRN: [id number] DOB: 10-19-1955 Today's Date: 08/16/2019    History of Present Illness 63 yo female s/p L3-S1 decompressive lumbar laminectomy.    PT Comments    Pt progressing well with post-op mobility. She was able to demonstrate transfers and ambulation with gross modified independence - supervision for maintenance of precautions during log roll. Education reinforced on precautions, appropriate activity progression, and car transfer. Pt anticipates d/c home today and has met acute PT goals. Will sign off at this time. If needs change, please reconsult.    Follow Up Recommendations  No PT follow up;Supervision - Intermittent     Equipment Recommendations  None recommended by PT    Recommendations for Other Services       Precautions / Restrictions Precautions Precautions: Fall;Back Precaution Booklet Issued: Yes (comment) Precaution Comments: Pt able to state precautions Restrictions Weight Bearing Restrictions: No    Mobility  Bed Mobility Overal bed mobility: Needs Assistance Bed Mobility: Sit to Supine     Supine to sit: Supervision Sit to supine: Supervision   General bed mobility comments: Pt not demonstrating proper log roll technique. Requires cues for safety and maintenance of precautions.   Transfers Overall transfer level: Modified independent Equipment used: None Transfers: Sit to/from Stand Sit to Stand: Modified independent (Device/Increase time)         General transfer comment: Good posture, no assist required.   Ambulation/Gait Ambulation/Gait assistance: Modified independent (Device/Increase time) Gait Distance (Feet): 350 Feet Assistive device: None Gait Pattern/deviations: Step-through pattern;Decreased stride length;Trunk flexed Gait velocity: Decreased Gait velocity interpretation: <1.8 ft/sec, indicate of risk for recurrent falls General Gait Details: Occasional  railing use in hall but overall able to ambulate well without unsteadiness or overt LOB.    Stairs Stairs: (Pt declined stair training - states she feels comfortable. )           Wheelchair Mobility    Modified Rankin (Stroke Patients Only)       Balance Overall balance assessment: Mild deficits observed, not formally tested                                          Cognition Arousal/Alertness: Awake/alert Behavior During Therapy: WFL for tasks assessed/performed Overall Cognitive Status: Within Functional Limits for tasks assessed                                        Exercises Total Joint Exercises Long Arc Quad: AROM;Both;5 reps;Seated    General Comments        Pertinent Vitals/Pain Pain Assessment: Faces Pain Score: 3  Faces Pain Scale: Hurts a little bit Pain Location: back Pain Descriptors / Indicators: Sore;Discomfort Pain Intervention(s): Limited activity within patient's tolerance;Monitored during session;Repositioned    Home Living Family/patient expects to be discharged to:: Private residence Living Arrangements: Spouse/significant other Available Help at Discharge: Family Type of Home: House Home Access: Stairs to enter   Home Layout: One level Home Equipment: Shower seat;Bedside commode      Prior Function Level of Independence: Independent          PT Goals (current goals can now be found in the care plan section) Acute Rehab PT Goals Patient Stated Goal: go home PT Goal Formulation: With patient Time For Goal  Achievement: 08/29/19 Potential to Achieve Goals: Good Progress towards PT goals: Progressing toward goals    Frequency    Min 5X/week      PT Plan Current plan remains appropriate    Co-evaluation              AM-PAC PT "6 Clicks" Mobility   Outcome Measure  Help needed turning from your back to your side while in a flat bed without using bedrails?: A Little Help needed  moving from lying on your back to sitting on the side of a flat bed without using bedrails?: A Little Help needed moving to and from a bed to a chair (including a wheelchair)?: A Little Help needed standing up from a chair using your arms (e.g., wheelchair or bedside chair)?: A Little Help needed to walk in hospital room?: A Little Help needed climbing 3-5 steps with a railing? : A Little 6 Click Score: 18    End of Session Equipment Utilized During Treatment: Gait belt Activity Tolerance: Patient tolerated treatment well;Patient limited by pain Patient left: in bed;with call bell/phone within reach;with nursing/sitter in room;with SCD's reapplied Nurse Communication: Mobility status PT Visit Diagnosis: Other abnormalities of gait and mobility (R26.89);Pain Pain - part of body: (back)     Time: 0912-0922 PT Time Calculation (min) (ACUTE ONLY): 10 min  Charges:  $Gait Training: 8-22 mins                     [name], PT, DPT Acute Rehabilitation Services Pager: [phone] Office: [phone]    [name] 08/16/2019, 10:24 AM

## 2019-08-21 ENCOUNTER — Other Ambulatory Visit: Payer: Self-pay | Admitting: Physician Assistant

## 2019-08-21 DIAGNOSIS — F339 Major depressive disorder, recurrent, unspecified: Secondary | ICD-10-CM

## 2019-09-10 ENCOUNTER — Other Ambulatory Visit: Payer: Self-pay | Admitting: Student in an Organized Health Care Education/Training Program

## 2019-09-10 DIAGNOSIS — G629 Polyneuropathy, unspecified: Secondary | ICD-10-CM

## 2019-09-21 ENCOUNTER — Encounter: Payer: Self-pay | Admitting: Student in an Organized Health Care Education/Training Program

## 2019-09-21 ENCOUNTER — Other Ambulatory Visit: Payer: Self-pay

## 2019-09-21 ENCOUNTER — Ambulatory Visit
Payer: PPO | Attending: Student in an Organized Health Care Education/Training Program | Admitting: Student in an Organized Health Care Education/Training Program

## 2019-09-21 DIAGNOSIS — M542 Cervicalgia: Secondary | ICD-10-CM | POA: Insufficient documentation

## 2019-09-21 DIAGNOSIS — G629 Polyneuropathy, unspecified: Secondary | ICD-10-CM

## 2019-09-21 DIAGNOSIS — Q761 Klippel-Feil syndrome: Secondary | ICD-10-CM | POA: Insufficient documentation

## 2019-09-21 DIAGNOSIS — M5030 Other cervical disc degeneration, unspecified cervical region: Secondary | ICD-10-CM | POA: Diagnosis not present

## 2019-09-21 DIAGNOSIS — M47812 Spondylosis without myelopathy or radiculopathy, cervical region: Secondary | ICD-10-CM | POA: Insufficient documentation

## 2019-09-21 DIAGNOSIS — G894 Chronic pain syndrome: Secondary | ICD-10-CM | POA: Diagnosis not present

## 2019-09-21 DIAGNOSIS — M4712 Other spondylosis with myelopathy, cervical region: Secondary | ICD-10-CM | POA: Insufficient documentation

## 2019-09-21 MED ORDER — GABAPENTIN 600 MG PO TABS: 600.0000 mg | ORAL_TABLET | Freq: Three times a day (TID) | ORAL | 1 refills | Status: DC

## 2019-09-21 NOTE — Progress Notes (Signed)
Patient: Gabrielle Pineda  Service Category: E/M  Provider: [name], MD  DOB: 1956-02-23  DOS: 09/21/2019  Location: Office  MRN: [id number]  Setting: Ambulatory outpatient  Referring Provider: Florian Buff*  Type: Established Patient  Specialty: Interventional Pain Management  PCP: [name], PA-C  Location: Home  Delivery: TeleHealth     Virtual Encounter - Pain Management PROVIDER NOTE: Information contained herein reflects review and annotations entered in association with encounter. Interpretation of such information and data should be left to medically-trained personnel. Information provided to patient can be located elsewhere in the medical record under "Patient Instructions". Document created using STT-dictation technology, any transcriptional errors that may result from process are unintentional.    Contact & Pharmacy Preferred: [place]: [phone] (home) Mobile: [phone] (mobile) E-mail: [email or web address]  [facility] [place]) [place] [address] Phone: [phone] Fax: [phone]   Pre-screening  Ms. Hata offered "in-person" vs "virtual" encounter. She indicated preferring virtual for this encounter.   Reason COVID-19*  Social distancing based on CDC and AMA recommendations.   I contacted KERENSA NICKLAS on 09/21/2019 via telephone.      I clearly identified myself as [name], MD. I verified that I was speaking with the correct person using two identifiers (Name: Gabrielle Pineda, and date of birth: 07/26/1956).  This visit was completed via telephone due to the restrictions of the COVID-19 pandemic. All issues as above were discussed and addressed but no physical exam was performed. If it was felt that the patient should be evaluated in the office, they were directed there. The patient verbally consented to this visit. Patient was  unable to complete an audio/visual visit due to Technical difficulties and/or Lack of internet. Due to the catastrophic nature of the COVID-19 pandemic, this visit was done through audio contact only.  Location of the patient: home address (see Epic for details)  Location of the provider: office Consent I sought verbal advanced consent from Gabrielle Pineda for virtual visit interactions. I informed Ms. Kerchner of possible security and privacy concerns, risks, and limitations associated with providing "not-in-person" medical evaluation and management services. I also informed Ms. Brester of the availability of "in-person" appointments. Finally, I informed her that there would be a charge for the virtual visit and that she could be  personally, fully or partially, financially responsible for it. Ms. Griffeth expressed understanding and agreed to proceed.   Historic Elements   [name] is a 64 y.o. year old, female patient evaluated today after her last contact with our practice on 09/10/2019. Ms. Knepp  has a past medical history of Alcohol abuse, Anemia, Anxiety, Arthritis, Cervicalgia, Cirrhosis ([place]) (1994), COPD (chronic obstructive pulmonary disease) ([place]), Depression, Dyspnea, GERD (gastroesophageal reflux disease), Headache, Heart murmur, Other and unspecified hyperlipidemia, and Tachycardia. She also  has a past surgical history that includes neck disc surgery; Breast enhancement surgery (1981); Esophagogastroduodenoscopy; Tonsillectomy and adenoidectomy (1973 ); Carpal tunnel release (11/11/2011); Carpal tunnel release (2013); Rotator cuff repair (06/16/2016); Rotator cuff repair; Colonoscopy with propofol (N/A, 11/01/2017); Augmentation mammaplasty (Bilateral, 1982); Anterior cervical decomp/discectomy fusion (2012, 2015, 2018); and Lumbar laminectomy/decompression microdiscectomy (N/A, 08/15/2019). Ms. Pastrana has a current medication list which includes the following  prescription(s): acyclovir cream, anoro ellipta, aripiprazole, atorvastatin, buspirone, cartia xt, diltiazem, ezetimibe, fluticasone, furosemide, gabapentin, guaifenesin, ibuprofen, ipratropium, leflunomide, linzess, meloxicam, methocarbamol, multivitamin with minerals, nicotine, misc natural products, omeprazole, oxycodone, pantoprazole, potassium  chloride sa, sertraline, topiramate, trazodone, valacyclovir, venlafaxine xr, vitamin b-12, vitamin e, xiidra, and [DISCONTINUED] sertraline. She  reports that she has been smoking. She has a 40.00 pack-year smoking history. She has never used smokeless tobacco. She reports that she does not drink alcohol or use drugs. Ms. Lanza is allergic to plaquenil [hydroxychloroquine].   HPI  Today, she is being contacted for follow-up evaluation   S/p L3- S1 decompression with laminectomy on 08/15/2019.  She is recovering from the surgery.  No hardware was placed.  She states that her postoperative pain is has enlarged heart improved and is returned back to her baseline pain.  She is requesting a refill of gabapentin today.  Patient utilizes 600 mg 3 times a day.  Patient is also endorsing bilateral neck pain related to cervical facet arthropathy, cervical spondylosis.  She is status post right C4, C5, C6, C7 cervical facet medial branch nerve block on 11/02/2018.  She found this block helpful, provided 75% pain relief for approximately 4 weeks with gradual return of pain thereafter.  She is interested in having both her left and right side done.  Risks and benefits were reviewed and patient would like to proceed.  Laboratory Chemistry Profile   Renal Lab Results  Component Value Date   BUN 11 08/09/2019   CREATININE 0.93 08/09/2019   BCR 16 04/17/2019   GFRAA >60 08/09/2019   GFRNONAA >60 08/09/2019    Hepatic Lab Results  Component Value Date   AST 24 08/09/2019   ALT 19 08/09/2019   ALBUMIN 4.0 08/09/2019   ALKPHOS 101 08/09/2019   LIPASE 63  03/23/2018    Electrolytes Lab Results  Component Value Date   NA 141 08/09/2019   K 3.5 08/09/2019   CL 115 (H) 08/09/2019   CALCIUM 8.9 08/09/2019    Bone No results found for: VD25OH, [id number], [id number], [id number], 25OHVITD1, 25OHVITD2, 25OHVITD3, TESTOFREE, TESTOSTERONE  Coagulation Lab Results  Component Value Date   PLT 188 08/09/2019    Cardiovascular Lab Results  Component Value Date   HGB 12.8 08/09/2019   HCT 39.6 08/09/2019    Inflammation (CRP: Acute Phase) (ESR: Chronic Phase) No results found for: CRP, ESRSEDRATE, LATICACIDVEN    Note: Above Lab results reviewed.  Imaging  DG Lumbar Spine 1 View CLINICAL DATA:  L3-S1 decompressive lumbar laminectomy  EXAM: LUMBAR SPINE - 1 VIEW  COMPARISON:  MRI lumbar spine dated 04/15/2019  FINDINGS: Single lateral view of the lumbar spine. Surgical probe posterior to the L4, L5, and S1 vertebral bodies.  IMPRESSION: Lumbar localization as above.  Electronically Signed   By: Julian Hy M.D.   On: 08/15/2019 10:43  Assessment  The primary encounter diagnosis was Spondylosis of cervical region without myelopathy or radiculopathy. Diagnoses of DDD (degenerative disc disease), cervical, Cervicalgia, Cervical fusion syndrome, Chronic pain syndrome, and Neuropathy were also pertinent to this visit.  Plan of Care  I have discontinued Khala Z. Negron's [employer] Mouthwash. I have also changed her gabapentin. Additionally, I am having her maintain her Nutritional Supplements (ESTROVEN ENERGY PO), leflunomide, Xiidra, fluticasone, furosemide, valACYclovir, ARIPiprazole, traZODone, busPIRone, potassium chloride SA, omeprazole, topiramate, atorvastatin, sertraline, ipratropium, Anoro Ellipta, ezetimibe, acyclovir cream, Cartia XT, vitamin B-12, multivitamin with minerals, Linzess, vitamin E, pantoprazole, nicotine, Guaifenesin, meloxicam, ibuprofen, oxyCODONE, methocarbamol, venlafaxine XR, and  diltiazem.  Pharmacotherapy (Medications Ordered): Meds ordered this encounter  Medications  . gabapentin (NEURONTIN) 600 MG tablet    Sig: Take 1 tablet (600 mg total) by mouth 3 (three) times  daily.    Dispense:  270 tablet    Refill:  1   Orders:  Orders Placed This Encounter  Procedures  . CERVICAL FACET (MEDIAL BRANCH NERVE BLOCK)     Standing Status:   Future    Standing Expiration Date:   10/19/2019    Scheduling Instructions:     Side: Bilateral     Level: C4-5, C5-6, C6-7 Facet joints (C4, C5, C6, & C7 Medial Branch Nerves)     Sedation: With Sedation.     Timeframe: As soon as schedule allows    Order Specific Question:   Where will this procedure be performed?    Answer:   [facility]   Follow-up plan:   Return in about 2 weeks (around 10/05/2019) for B/L C4-7 MBNB #2, with sedation (30 mins).   Recent Visits No visits were found meeting these conditions.  Showing recent visits within past 90 days and meeting all other requirements   Today's Visits Date Type Provider Dept  09/21/19 Office Visit [name], MD [facility]  Showing today's visits and meeting all other requirements   Future Appointments No visits were found meeting these conditions.  Showing future appointments within next 90 days and meeting all other requirements   I discussed the assessment and treatment plan with the patient. The patient was provided an opportunity to ask questions and all were answered. The patient agreed with the plan and demonstrated an understanding of the instructions.  Patient advised to call back or seek an in-person evaluation if the symptoms or condition worsens.  Duration of encounter: 35mnutes.  Note by: BGillis Santa MD Date: 09/21/2019; Time: 2:59 PM

## 2019-09-25 ENCOUNTER — Other Ambulatory Visit: Payer: Self-pay | Admitting: Physician Assistant

## 2019-09-25 DIAGNOSIS — F321 Major depressive disorder, single episode, moderate: Secondary | ICD-10-CM

## 2019-09-26 NOTE — Telephone Encounter (Signed)
Requested Prescriptions  Pending Prescriptions Disp Refills  . sertraline (ZOLOFT) 100 MG tablet [Pharmacy Med Name: Sertraline HCl 100 MG Oral Tablet] 90 tablet 0    Sig: Take 1 tablet by mouth once daily     Psychiatry:  Antidepressants - SSRI Passed - 09/25/2019  7:32 PM      Passed - Completed PHQ-2 or PHQ-9 in the last 360 days.      Passed - Valid encounter within last 6 months    Recent Outpatient Visits          4 months ago Encounter for Papanicolaou smear for cervical cancer screening   [place], [place], [place]   5 months ago Vaginal discharge   [place], [place]   5 months ago Initial Medicare annual wellness visit   [place], [place]   11 months ago Depression, recurrent [facility])   [place], [place]   12 months ago Depression, major, single episode, moderate [facility])   [facility], [place], [place]

## 2019-10-04 ENCOUNTER — Encounter: Payer: Self-pay | Admitting: Student in an Organized Health Care Education/Training Program

## 2019-10-04 ENCOUNTER — Other Ambulatory Visit: Payer: Self-pay

## 2019-10-04 ENCOUNTER — Ambulatory Visit
Admission: RE | Admit: 2019-10-04 | Discharge: 2019-10-04 | Disposition: A | Discharge status: Home / Self Care | Payer: PPO | Source: Ambulatory Visit | Attending: Student in an Organized Health Care Education/Training Program | Admitting: Student in an Organized Health Care Education/Training Program

## 2019-10-04 ENCOUNTER — Ambulatory Visit (HOSPITAL_BASED_OUTPATIENT_CLINIC_OR_DEPARTMENT_OTHER): Payer: PPO | Admitting: Student in an Organized Health Care Education/Training Program

## 2019-10-04 VITALS — BP 151/71 | HR 78 | Temp 97.7°F | Resp 18 | Ht 69.0 in | Wt 150.0 lb

## 2019-10-04 DIAGNOSIS — M47812 Spondylosis without myelopathy or radiculopathy, cervical region: Secondary | ICD-10-CM | POA: Insufficient documentation

## 2019-10-04 MED ORDER — LIDOCAINE HCL 2 % IJ SOLN
20.0000 mL | Freq: Once | INTRAMUSCULAR | Status: AC
  Administered 2019-10-04: 400 mg
  Filled 2019-10-04: qty 40

## 2019-10-04 MED ORDER — ROPIVACAINE HCL 2 MG/ML IJ SOLN
9.0000 mL | Freq: Once | INTRAMUSCULAR | Status: AC
  Administered 2019-10-04: 9 mL via PERINEURAL
  Filled 2019-10-04: qty 10

## 2019-10-04 MED ORDER — DEXAMETHASONE SODIUM PHOSPHATE 10 MG/ML IJ SOLN
10.0000 mg | Freq: Once | INTRAMUSCULAR | Status: DC
  Filled 2019-10-04: qty 1

## 2019-10-04 MED ORDER — DEXAMETHASONE SODIUM PHOSPHATE 10 MG/ML IJ SOLN
20.0000 mg | Freq: Once | INTRAMUSCULAR | Status: AC
  Administered 2019-10-04: 20 mg
  Filled 2019-10-04: qty 2

## 2019-10-04 MED ORDER — FENTANYL CITRATE (PF) 100 MCG/2ML IJ SOLN
25.0000 ug | INTRAMUSCULAR | Status: DC | PRN
  Administered 2019-10-04: 75 ug via INTRAVENOUS
  Filled 2019-10-04: qty 2

## 2019-10-04 NOTE — Patient Instructions (Signed)

## 2019-10-04 NOTE — Progress Notes (Signed)
For for patient's Name: Gabrielle Pineda  MRN: [id number]  Referring Provider: [name], [place]*  DOB: 1956-04-13  PCP: [name], PA-C  DOS: 10/04/2019  Note by: [name], MD  Service setting: Ambulatory outpatient  Specialty: Interventional Pain Management  Patient type: Established  Location: [facility] (AMB) Pain Management Facility  Visit type: Interventional Procedure   Primary Reason for Visit: Interventional Pain Management Treatment. [id number] pain  Procedure:       Anesthesia, Analgesia, Anxiolysis:  Type: Cervical Facet Medial Branch Block(s) #2  Primary Purpose: Diagnostic Region: Posterolateral cervical spine Level: C4, C5, C6, & C7 Medial Branch Level(s). Injecting these levels blocks the C4-5, C5-6, and C6-7 cervical facet joints. Laterality: Bilateral Paraspinal  Type: Moderate (Conscious) Sedation combined with Local Anesthesia Indication(s): Analgesia and Anxiety Route: Intravenous (IV) IV Access: Secured Sedation: Meaningful verbal contact was maintained at all times during the procedure  Local Anesthetic: Lidocaine 1-2%   Indications: 1. Spondylosis of cervical region without myelopathy or radiculopathy    Pain Score: Pre-procedure: 8 /10 Post-procedure: 0-No pain/10  Pre-op Assessment:  Gabrielle Pineda is a 64 y.o. (year old), female patient, seen today for interventional treatment. She  has a past surgical history that includes neck disc surgery; Breast enhancement surgery (1981); Esophagogastroduodenoscopy; Tonsillectomy and adenoidectomy (1973 ); Carpal tunnel release (11/11/2011); Carpal tunnel release (2013); Rotator cuff repair (06/16/2016); Rotator cuff repair; Colonoscopy with propofol (N/A, 11/01/2017); Augmentation mammaplasty (Bilateral, 1982); Anterior cervical decomp/discectomy fusion (2012, 2015, 2018); and Lumbar laminectomy/decompression microdiscectomy (N/A, 08/15/2019). Gabrielle Pineda has a current medication list which includes the  following prescription(s): acyclovir cream, anoro ellipta, aripiprazole, atorvastatin, buspirone, cartia xt, diltiazem, ezetimibe, fluticasone, furosemide, gabapentin, guaifenesin, ibuprofen, ipratropium, leflunomide, linzess, meloxicam, methocarbamol, multivitamin with minerals, nicotine, misc natural products, omeprazole, oxycodone, pantoprazole, potassium chloride sa, sertraline, topiramate, trazodone, valacyclovir, venlafaxine xr, vitamin b-12, vitamin e, xiidra, and [DISCONTINUED] sertraline, and the following Facility-Administered Medications: dexamethasone and fentanyl. Her primarily concern today is the Neck Pain (bilateral )  Initial Vital Signs:  Pulse/HCG Rate: 81ECG Heart Rate: 78 Temp: 99 F (37.2 C) Resp: 16 BP: 137/81 SpO2: 99 %  BMI: Estimated body mass index is 22.15 kg/m as calculated from the following:   Height as of this encounter: 5\' 9"  (1.753 m).   Weight as of this encounter: 150 lb (68 kg).  Risk Assessment: Allergies: Reviewed. She is allergic to plaquenil [hydroxychloroquine].  Allergy Precautions: None required Coagulopathies: Reviewed. None identified.  Blood-thinner therapy: None at this time Active Infection(s): Reviewed. None identified. Gabrielle Pineda is afebrile  Site Confirmation: Gabrielle Pineda was asked to confirm the procedure and laterality before marking the site Procedure checklist: Completed Consent: Before the procedure and under the influence of no sedative(s), amnesic(s), or anxiolytics, the patient was informed of the treatment options, risks and possible complications. To fulfill our ethical and legal obligations, as recommended by the American Medical Association's Code of Ethics, I have informed the patient of my clinical impression; the nature and purpose of the treatment or procedure; the risks, benefits, and possible complications of the intervention; the alternatives, including doing nothing; the risk(s) and benefit(s) of the alternative  treatment(s) or procedure(s); and the risk(s) and benefit(s) of doing nothing. The patient was provided information about the general risks and possible complications associated with the procedure. These may include, but are not limited to: failure to achieve desired goals, infection, bleeding, organ or nerve damage, allergic reactions, paralysis, and death. In addition, the patient was informed of those risks and complications  associated to Spine-related procedures, such as failure to decrease pain; infection (i.e.: Meningitis, epidural or intraspinal abscess); bleeding (i.e.: epidural hematoma, subarachnoid hemorrhage, or any other type of intraspinal or peri-dural bleeding); organ or nerve damage (i.e.: Any type of peripheral nerve, nerve root, or spinal cord injury) with subsequent damage to sensory, motor, and/or autonomic systems, resulting in permanent pain, numbness, and/or weakness of one or several areas of the body; allergic reactions; (i.e.: anaphylactic reaction); and/or death. Furthermore, the patient was informed of those risks and complications associated with the medications. These include, but are not limited to: allergic reactions (i.e.: anaphylactic or anaphylactoid reaction(s)); adrenal axis suppression; blood sugar elevation that in diabetics may result in ketoacidosis or comma; water retention that in patients with history of congestive heart failure may result in shortness of breath, pulmonary edema, and decompensation with resultant heart failure; weight gain; swelling or edema; medication-induced neural toxicity; particulate matter embolism and blood vessel occlusion with resultant organ, and/or nervous system infarction; and/or aseptic necrosis of one or more joints. Finally, the patient was informed that Medicine is not an exact science; therefore, there is also the possibility of unforeseen or unpredictable risks and/or possible complications that may result in a catastrophic  outcome. The patient indicated having understood very clearly. We have given the patient no guarantees and we have made no promises. Enough time was given to the patient to ask questions, all of which were answered to the patient's satisfaction. Ms. Gunner has indicated that she wanted to continue with the procedure. Attestation: I, the ordering provider, attest that I have discussed with the patient the benefits, risks, side-effects, alternatives, likelihood of achieving goals, and potential problems during recovery for the procedure that I have provided informed consent. Date  Time:   Pre-Procedure Preparation:  Monitoring: As per clinic protocol. Respiration, ETCO2, SpO2, BP, heart rate and rhythm monitor placed and checked for adequate function Safety Precautions: Patient was assessed for positional comfort and pressure points before starting the procedure. Time-out: I initiated and conducted the "Time-out" before starting the procedure, as per protocol. The patient was asked to participate by confirming the accuracy of the "Time Out" information. Verification of the correct person, site, and procedure were performed and confirmed by me, the nursing staff, and the patient. "Time-out" conducted as per Joint Commission's Universal Protocol (UP.01.01.01). Time: 1045  Description of Procedure:       Position: Prone with head of the table raised to facilitate breathing. Laterality: Bilateral. The procedure was performed in identical fashion on both sides. Level: C4, C5, C6, & C7 Medial Branch Level(s). Area Prepped: Posterior Cervico-thoracic Region Prepping solution: ChloraPrep (2% chlorhexidine gluconate and 70% isopropyl alcohol) Safety Precautions: Aspiration looking for blood return was conducted prior to all injections. At no point did we inject any substances, as a needle was being advanced. Before injecting, the patient was told to immediately notify me if she was experiencing any new  onset of "ringing in the ears, or metallic taste in the mouth". No attempts were made at seeking any paresthesias. Safe injection practices and needle disposal techniques used. Medications properly checked for expiration dates. SDV (single dose vial) medications used. After the completion of the procedure, all disposable equipment used was discarded in the proper designated medical waste containers. Local Anesthesia: Protocol guidelines were followed. The patient was positioned over the fluoroscopy table. The area was prepped in the usual manner. The time-out was completed. The target area was identified using fluoroscopy. A 12-in long, straight, sterile hemostat  was used with fluoroscopic guidance to locate the targets for each level blocked. Once located, the skin was marked with an approved surgical skin marker. Once all sites were marked, the skin (epidermis, dermis, and hypodermis), as well as deeper tissues (fat, connective tissue and muscle) were infiltrated with a small amount of a short-acting local anesthetic, loaded on a 10cc syringe with a 25G, 1.5-in  Needle. An appropriate amount of time was allowed for local anesthetics to take effect before proceeding to the next step. Local Anesthetic: Lidocaine 2.0% The unused portion of the local anesthetic was discarded in the proper designated containers. Technical explanation of process:   C4 Medial Branch Nerve Block (MBB): The target area for the C4 dorsal medial articular branch is the lateral concave waist of the articular pillar of C4. Under fluoroscopic guidance, a Quincke needle was inserted until contact was made with os over the postero-lateral aspect of the articular pillar of C4 (target area). After negative aspiration for blood, 99mL of the nerve block solution was injected without difficulty or complication. The needle was removed intact. C5 Medial Branch Nerve Block (MBB): The target area for the C5 dorsal medial articular branch is the  lateral concave waist of the articular pillar of C5. Under fluoroscopic guidance, a Quincke needle was inserted until contact was made with os over the postero-lateral aspect of the articular pillar of C5 (target area). After negative aspiration for blood, 1 mL of the nerve block solution was injected without difficulty or complication. The needle was removed intact. C6 Medial Branch Nerve Block (MBB): The target area for the C6 dorsal medial articular branch is the lateral concave waist of the articular pillar of C6. Under fluoroscopic guidance, a Quincke needle was inserted until contact was made with os over the postero-lateral aspect of the articular pillar of C6 (target area). After negative aspiration for blood, 56mL of the nerve block solution was injected without difficulty or complication. The needle was removed intact. C7 Medial Branch Nerve Block (MBB): The target for the C7 dorsal medial articular branch lies on the superior-medial tip of the C7 transverse process. Under fluoroscopic guidance, a Quincke needle was inserted until contact was made with os over the postero-lateral aspect of the articular pillar of C7 (target area). After negative aspiration for blood, 25mL of the nerve block solution was injected without difficulty or complication. The needle was removed intact.  Procedural Needles: 22-gauge, 3.5-inch, Quincke needles used for all levels. Nerve block solution: 5 cc solution made of 4 cc of 0.2% ropivacaine, 1 cc of Decadron 10 mg/cc.  1 cc injected at each level above for the right side.        5 cc solution made of 4 cc of 0.2% ropivacaine, 1 cc of Decadron 10 mg/cc.  1 cc injected at each level above for the LEFT side. The unused portion of the solution was discarded in the proper designated containers.  Once the entire procedure was completed, the treated area was cleaned, making sure to leave some of the prepping solution back to take advantage of its long term bactericidal  properties.  Vitals:   10/04/19 1105 10/04/19 1112 10/04/19 1123 10/04/19 1134  BP: 129/71 131/67 (!) 145/75 (!) 151/71  Pulse:      Resp: 16 16 18 18   Temp:  97.7 F (36.5 C)    TempSrc:      SpO2: 96% 97% 99% 98%  Weight:      Height:  Start Time: 1045 hrs. End Time: 1105 hrs.  Imaging Guidance (Spinal):  Type of Imaging Technique: Fluoroscopy Guidance (Spinal) Indication(s): Assistance in needle guidance and placement for procedures requiring needle placement in or near specific anatomical locations not easily accessible without such assistance. Exposure Time: Please see nurses notes. Contrast: None used. Fluoroscopic Guidance: I was personally present during the use of fluoroscopy. "Tunnel Vision Technique" used to obtain the best possible view of the target area. Parallax error corrected before commencing the procedure. "Direction-depth-direction" technique used to introduce the needle under continuous pulsed fluoroscopy. Once target was reached, antero-posterior, oblique, and lateral fluoroscopic projection used confirm needle placement in all planes. Images permanently stored in EMR. Interpretation: No contrast injected. I personally interpreted the imaging intraoperatively. Adequate needle placement confirmed in multiple planes. Permanent images saved into the patient's record.  Antibiotic Prophylaxis:   Anti-infectives (From admission, onward)   None     Indication(s): None identified  Post-operative Assessment:  Post-procedure Vital Signs:  Pulse/HCG Rate: 7872 Temp: 97.7 F (36.5 C) Resp: 18 BP: (!) 151/71 SpO2: 98 %  EBL: None  Complications: After the procedure, upon transitioning from prone to sitting, patient developed a headache.  She was monitored in the recovery room.  She stated that her headache was improving at the time of discharge.  No nausea.  Endorses improvement in her right-sided neck pain.  Patient does have a history of chronic migraines.   Informed patient that if she develops any vision changes, if headache worsens or develops nausea vomiting to contact our clinic.  Patient endorsed understanding.  Note: The patient tolerated the entire procedure well. A repeat set of vitals were taken after the procedure and the patient was kept under observation following institutional policy, for this type of procedure. Post-procedural neurological assessment was performed, showing return to baseline, prior to discharge. The patient was provided with post-procedure discharge instructions, including a section on how to identify potential problems. Should any problems arise concerning this procedure, the patient was given instructions to immediately contact us, at any time, without hesitation. In any case, we plan to contact the patient by telephone for a follow-up status report regarding this interventional procedure.  Comments:  No additional relevant information. 5 out of 5 strength bilateral upper extremity: Shoulder abduction, elbow flexion, elbow extension, thumb extension.  Plan of Care    Imaging Orders     DG PAIN CLINIC C-ARM 1-60 MIN NO REPORT  Medications ordered for procedure: Meds ordered this encounter  Medications  . lidocaine (XYLOCAINE) 2 % (with pres) injection 400 mg  . fentaNYL (SUBLIMAZE) injection 25-50 mcg    Make sure Narcan is available in the pyxis when using this medication. In the event of respiratory depression (RR< 8/min): Titrate NARCAN (naloxone) in increments of 0.1 to 0.2 mg IV at 2-3 minute intervals, until desired degree of reversal.  . ropivacaine (PF) 2 mg/mL (0.2%) (NAROPIN) injection 9 mL  . dexamethasone (DECADRON) injection 10 mg  . dexamethasone (DECADRON) injection 20 mg  . ropivacaine (PF) 2 mg/mL (0.2%) (NAROPIN) injection 9 mL   Medications administered: We administered lidocaine, fentaNYL, ropivacaine (PF) 2 mg/mL (0.2%), dexamethasone, and ropivacaine (PF) 2 mg/mL (0.2%).  See the medical  record for exact dosing, route, and time of administration.  Disposition: Discharge home  Discharge Date & Time: 10/04/2019; 1134 hrs.   Physician-requested Follow-up: Return in about 6 weeks (around 11/15/2019) for Post Procedure Evaluation, virtual.  Future Appointments  Date Time Provider [place]  11/14/2019  8:15 AM [name], MD [facility] None  12/18/2019 10:00 AM Gollan, Kathlene November, MD [facility]   Primary Care Physician: Rubye Beach Location: [facility] Note by: [name], MD Date: 10/04/2019; Time: 12:34 PM  Disclaimer:  Medicine is not an exact science. The only guarantee in medicine is that nothing is guaranteed. It is important to note that the decision to proceed with this intervention was based on the information collected from the patient. The Data and conclusions were drawn from the patient's questionnaire, the interview, and the physical examination. Because the information was provided in large part by the patient, it cannot be guaranteed that it has not been purposely or unconsciously manipulated. Every effort has been made to obtain as much relevant data as possible for this evaluation. It is important to note that the conclusions that lead to this procedure are derived in large part from the available data. Always take into account that the treatment will also be dependent on availability of resources and existing treatment guidelines, considered by other Pain Management Practitioners as being common knowledge and practice, at the time of the intervention. For Medico-Legal purposes, it is also important to point out that variation in procedural techniques and pharmacological choices are the acceptable norm. The indications, contraindications, technique, and results of the above procedure should only be interpreted and judged by a Board-Certified Interventional Pain Specialist with extensive familiarity and  expertise in the same exact procedure and technique.

## 2019-10-05 ENCOUNTER — Telehealth: Payer: Self-pay

## 2019-10-05 NOTE — Telephone Encounter (Signed)
Denies any needs at this time. Instructed to call if needed. 

## 2019-10-08 ENCOUNTER — Encounter: Payer: Self-pay | Admitting: Physician Assistant

## 2019-10-08 ENCOUNTER — Other Ambulatory Visit: Payer: Self-pay | Admitting: Physician Assistant

## 2019-10-08 DIAGNOSIS — F5101 Primary insomnia: Secondary | ICD-10-CM

## 2019-10-08 DIAGNOSIS — F321 Major depressive disorder, single episode, moderate: Secondary | ICD-10-CM

## 2019-10-08 NOTE — Telephone Encounter (Signed)
Requested medication (s) are due for refill today: yes  Requested medication (s) are on the active medication list: yes  Last refill:  04/10/2019   #90  1 refill  Future visit scheduled no  Notes to clinic:not delegated   Requested Prescriptions  Pending Prescriptions Disp Refills   ARIPiprazole (ABILIFY) 10 MG tablet [Pharmacy Med Name: ARIPiprazole 10 MG Oral Tablet] 90 tablet 0    Sig: Take 1 tablet by mouth once daily      Not Delegated - Psychiatry:  Antipsychotics - Second Generation (Atypical) - aripiprazole Failed - 10/08/2019  5:44 PM      Failed - This refill cannot be delegated      Passed - Valid encounter within last 6 months    Recent Outpatient Visits           4 months ago Encounter for Papanicolaou smear for cervical cancer screening   [facility] [place], Clearnce Sorrel, [place]   5 months ago Vaginal discharge   [place], Clearnce Sorrel, [place]   5 months ago Initial Medicare annual wellness visit   [facility] [place] M, [place]   11 months ago Depression, recurrent [facility])   [place], [place], [place]   1 year ago Depression, major, single episode, moderate [facility])   [place], Clearnce Sorrel, PA-C       Future Appointments             In 2 months Gollan, Kathlene November, MD [place], [facility]             Signed Prescriptions Disp Refills   traZODone (DESYREL) 100 MG tablet 120 tablet 0    Sig: TAKE 2 TABLETS BY MOUTH AT BEDTIME      Psychiatry: Antidepressants - Serotonin Modulator Passed - 10/08/2019  5:44 PM      Passed - Completed PHQ-2 or PHQ-9 in the last 360 days.      Passed - Valid encounter within last 6 months    Recent Outpatient Visits           4 months ago Encounter for Papanicolaou smear for cervical cancer screening   [place], Clearnce Sorrel, [place]   5 months ago Vaginal discharge   [place], [place]   5 months ago Initial Medicare annual wellness visit   [facility] [place] Malling M, [place]   11 months ago Depression, recurrent [facility])   [place], [place], [place]   1 year ago Depression, major, single episode, moderate [facility])   [place], PA-C       Future Appointments             In 2 months Gollan, Kathlene November, MD [facility], [facility]

## 2019-10-09 MED ORDER — TRAZODONE HCL 100 MG PO TABS: 100.0000 mg | ORAL_TABLET | Freq: Every day | ORAL | 1 refills | Status: DC

## 2019-10-09 NOTE — Telephone Encounter (Signed)
L.O.V. was on 05/19/2019 and no upcoming appointment.

## 2019-10-11 ENCOUNTER — Encounter: Payer: Self-pay | Admitting: Physician Assistant

## 2019-10-11 DIAGNOSIS — F5101 Primary insomnia: Secondary | ICD-10-CM

## 2019-10-12 MED ORDER — TRAZODONE HCL 100 MG PO TABS: 100.0000 mg | ORAL_TABLET | Freq: Every day | ORAL | 1 refills | Status: DC

## 2019-10-12 NOTE — Addendum Note (Signed)
Addended by: [name] on: 10/12/2019 10:27 AM   Modules accepted: Orders

## 2019-10-23 DIAGNOSIS — M0579 Rheumatoid arthritis with rheumatoid factor of multiple sites without organ or systems involvement: Secondary | ICD-10-CM | POA: Diagnosis not present

## 2019-11-05 ENCOUNTER — Other Ambulatory Visit: Payer: Self-pay | Admitting: Physician Assistant

## 2019-11-05 DIAGNOSIS — F411 Generalized anxiety disorder: Secondary | ICD-10-CM

## 2019-11-07 ENCOUNTER — Other Ambulatory Visit: Payer: Self-pay | Admitting: Physician Assistant

## 2019-11-07 DIAGNOSIS — F411 Generalized anxiety disorder: Secondary | ICD-10-CM

## 2019-11-07 NOTE — Telephone Encounter (Signed)
Requested Prescriptions  Pending Prescriptions Disp Refills  . busPIRone (BUSPAR) 30 MG tablet [Pharmacy Med Name: busPIRone HCl 30 MG Oral Tablet] 90 tablet 0    Sig: TAKE 1 TABLET BY MOUTH THREE TIMES DAILY     Psychiatry: Anxiolytics/Hypnotics - Non-controlled Passed - 11/07/2019  4:48 AM      Passed - Valid encounter within last 6 months    Recent Outpatient Visits          5 months ago Encounter for Papanicolaou smear for cervical cancer screening   [facility] [place], Clearnce Sorrel, [place]   6 months ago Vaginal discharge   [facility] [place] M[place], [place]   6 months ago Initial Medicare annual wellness visit   [place], [place], [place]   1 year ago Depression, recurrent [facility])   [place], [place], [place]   1 year ago Depression, major, single episode, moderate [facility])   [place], Clearnce Sorrel, PA-C      Future Appointments            In 1 month Gollan, Kathlene November, MD [facility], [facility]

## 2019-11-13 ENCOUNTER — Telehealth: Payer: Self-pay | Admitting: Student in an Organized Health Care Education/Training Program

## 2019-11-13 ENCOUNTER — Telehealth: Payer: Self-pay

## 2019-11-13 ENCOUNTER — Encounter: Payer: Self-pay | Admitting: Student in an Organized Health Care Education/Training Program

## 2019-11-13 NOTE — Telephone Encounter (Signed)
Patient returned nurse call for Tues appt.

## 2019-11-13 NOTE — Telephone Encounter (Signed)
Attempted to call patient, no answer, left message to call us back.

## 2019-11-13 NOTE — Telephone Encounter (Signed)
Attempted to call patient back. No answer, message left.

## 2019-11-13 NOTE — Telephone Encounter (Signed)
LM for patient to call office

## 2019-11-14 ENCOUNTER — Encounter: Payer: Self-pay | Admitting: Student in an Organized Health Care Education/Training Program

## 2019-11-14 ENCOUNTER — Other Ambulatory Visit: Payer: Self-pay

## 2019-11-14 ENCOUNTER — Ambulatory Visit
Payer: PPO | Attending: Student in an Organized Health Care Education/Training Program | Admitting: Student in an Organized Health Care Education/Training Program

## 2019-11-14 DIAGNOSIS — M47812 Spondylosis without myelopathy or radiculopathy, cervical region: Secondary | ICD-10-CM | POA: Diagnosis not present

## 2019-11-14 DIAGNOSIS — Q761 Klippel-Feil syndrome: Secondary | ICD-10-CM

## 2019-11-14 DIAGNOSIS — M5030 Other cervical disc degeneration, unspecified cervical region: Secondary | ICD-10-CM | POA: Diagnosis not present

## 2019-11-14 DIAGNOSIS — G894 Chronic pain syndrome: Secondary | ICD-10-CM | POA: Diagnosis not present

## 2019-11-14 DIAGNOSIS — G629 Polyneuropathy, unspecified: Secondary | ICD-10-CM

## 2019-11-14 NOTE — Progress Notes (Signed)
Patient: Gabrielle Pineda  Service Category: E/M  Provider: [name], MD  DOB: [birth year]  DOS: 11/14/2019  Location: Office  MRN: [id number]  Setting: Ambulatory outpatient  Referring Provider: Florian Buff*  Type: Established Patient  Specialty: Interventional Pain Management  PCP: [name], PA-C  Location: Home  Delivery: TeleHealth     Virtual Encounter - Pain Management PROVIDER NOTE: Information contained herein reflects review and annotations entered in association with encounter. Interpretation of such information and data should be left to medically-trained personnel. Information provided to patient can be located elsewhere in the medical record under "Patient Instructions". Document created using STT-dictation technology, any transcriptional errors that may result from process are unintentional.    Contact & Pharmacy Preferred: [place]: [phone] (home) Mobile: [phone] (mobile) E-mail: [email or web address]  [place] [address] [place] ([place])  [address] Phone: [phone] Fax: [phone]   Pre-screening  Gabrielle Pineda offered "in-person" vs "virtual" encounter. She indicated preferring virtual for this encounter.   Reason COVID-19*  Social distancing based on CDC and AMA recommendations.   I contacted Gabrielle Pineda on 11/14/2019 via telephone.      I clearly identified myself as [name], MD. I verified that I was speaking with the correct person using two identifiers (Name: Gabrielle Pineda, and date of birth: Dec 08, 1955).   Consent I sought verbal advanced consent from Gabrielle Pineda for virtual visit interactions. I informed Gabrielle Pineda of possible security and privacy concerns, risks, and limitations associated with providing "not-in-person" medical evaluation and management services. I also informed Gabrielle Pineda of the availability of  "in-person" appointments. Finally, I informed her that there would be a charge for the virtual visit and that she could be  personally, fully or partially, financially responsible for it. Gabrielle Pineda expressed understanding and agreed to proceed.   Historic Elements   Gabrielle Pineda is a 64 y.o. year old, female patient evaluated today after her last contact with our practice on 11/13/2019. Gabrielle Pineda  has a past medical history of Alcohol abuse, Anemia, Anxiety, Arthritis, Cervicalgia, Cirrhosis ([place]) (1994), COPD (chronic obstructive pulmonary disease) ([place]), Depression, Dyspnea, GERD (gastroesophageal reflux disease), Headache, Heart murmur, Other and unspecified hyperlipidemia, and Tachycardia. She also  has a past surgical history that includes neck disc surgery; Breast enhancement surgery (1981); Esophagogastroduodenoscopy; Tonsillectomy and adenoidectomy (1973 ); Carpal tunnel release (11/11/2011); Carpal tunnel release (2013); Rotator cuff repair (06/16/2016); Rotator cuff repair; Colonoscopy with propofol (N/A, 11/01/2017); Augmentation mammaplasty (Bilateral, 1982); Anterior cervical decomp/discectomy fusion (2012, 2015, 2018); and Lumbar laminectomy/decompression microdiscectomy (N/A, 08/15/2019). Gabrielle Pineda has a current medication list which includes the following prescription(s): acyclovir cream, anoro ellipta, aripiprazole, atorvastatin, buspirone, cartia xt, diltiazem, ezetimibe, fluticasone, furosemide, gabapentin, guaifenesin, ibuprofen, ipratropium, leflunomide, linzess, meloxicam, methocarbamol, multivitamin with minerals, nicotine, misc natural products, omeprazole, oxycodone, pantoprazole, potassium chloride sa, sertraline, topiramate, trazodone, valacyclovir, venlafaxine xr, vitamin b-12, vitamin e, xiidra, and [DISCONTINUED] sertraline. She  reports that she has been smoking. She has a 40.00 pack-year smoking history. She has never used smokeless tobacco. She reports that she  does not drink alcohol or use drugs. Gabrielle Pineda is allergic to plaquenil [hydroxychloroquine].   HPI  Today, she is being contacted for a post-procedure assessment.   Evaluation of last interventional procedure  11/13/2019 Procedure:   Type: Cervical Facet Medial Branch Block(s) #2  Primary Purpose: Diagnostic Region: Posterolateral cervical spine Level: C4, C5, C6, & C7  Medial Branch Level(s). Injecting these levels blocks the C4-5, C5-6, and C6-7 cervical facet joints. Laterality: Bilateral Paraspinal  Sedation: Please see nurses note for DOS. When no sedatives are used, the analgesic levels obtained are directly associated to the effectiveness of the local anesthetics. However, when sedation is provided, the level of analgesia obtained during the initial 1 hour following the intervention, is believed to be the result of a combination of factors. These factors may include, but are not limited to: 1. The effectiveness of the local anesthetics used. 2. The effects of the analgesic(s) and/or anxiolytic(s) used. 3. The degree of discomfort experienced by the patient at the time of the procedure. 4. The patients ability and reliability in recalling and recording the events. 5. The presence and influence of possible secondary gains and/or psychosocial factors. Reported result: Relief experienced during the 1st hour after the procedure: 100 % (Ultra-Short Term Relief)            Interpretative annotation: Clinically appropriate result. Analgesia during this period is likely to be Local Anesthetic and/or IV Sedative (Analgesic/Anxiolytic) related.          Effects of local anesthetic: The analgesic effects attained during this period are directly associated to the localized infiltration of local anesthetics and therefore cary significant diagnostic value as to the etiological location, or anatomical origin, of the pain. Expected duration of relief is directly dependent on the pharmacodynamics of  the local anesthetic used. Long-acting (4-6 hours) anesthetics used.  Reported result: Relief during the next 4 to 6 hour after the procedure: 100 % (Short-Term Relief)            Interpretative annotation: Clinically appropriate result. Analgesia during this period is likely to be Local Anesthetic-related.          Long-term benefit: Defined as the period of time past the expected duration of local anesthetics (1 hour for short-acting and 4-6 hours for long-acting). With the possible exception of prolonged sympathetic blockade from the local anesthetics, benefits during this period are typically attributed to, or associated with, other factors such as analgesic sensory neuropraxia, antiinflammatory effects, or beneficial biochemical changes provided by agents other than the local anesthetics.  Reported result: Extended relief following procedure: 65 % (Long-Term Relief)            Interpretative annotation: Clinically appropriate result. Good relief.  Inflammation plays a part in the etiology to the pain.           UDS:  Summary  Date Value Ref Range Status  09/07/2017 FINAL  Final    Comment:    ==================================================================== [facility] COMP DRUG ANALYSIS,UR ==================================================================== Specimen Alert Note:  Urinary creatinine is low; ability to detect some drugs may be compromised.  Interpret results with caution. ==================================================================== Test                             Result       Flag       Units Drug Present and Declared for Prescription Verification   Oxycodone                      493          EXPECTED   ng/mg creat   Oxymorphone                    433          EXPECTED   ng/mg  creat   Noroxycodone                   3433         EXPECTED   ng/mg creat    Sources of oxycodone include scheduled prescription medications.    Oxymorphone and noroxycodone are  expected metabolites of    oxycodone. Oxymorphone is also available as a scheduled    prescription medication.   Gabapentin                     PRESENT      EXPECTED   Desmethylcyclobenzaprine       PRESENT      EXPECTED    Desmethylcyclobenzaprine is an expected metabolite of    cyclobenzaprine.   Trazodone                      PRESENT      EXPECTED   1,3 chlorophenyl piperazine    PRESENT      EXPECTED    1,3-chlorophenyl piperazine is an expected metabolite of    trazodone.   Venlafaxine                    PRESENT      EXPECTED   Desmethylvenlafaxine           PRESENT      EXPECTED    Desmethylvenlafaxine is an expected metabolite of venlafaxine. Drug Present not Declared for Prescription Verification   Acetaminophen                  PRESENT      UNEXPECTED   Naproxen                       PRESENT      UNEXPECTED   Diphenhydramine                PRESENT      UNEXPECTED   Doxylamine                     PRESENT      UNEXPECTED   Dextromethorphan               PRESENT      UNEXPECTED   Dextrorphan/Levorphanol        PRESENT      UNEXPECTED    Dextrorphan is an expected metabolite of dextromethorphan, an    over-the-counter or prescription cough suppressant. Dextrorphan    cannot be distinguished from the scheduled prescription    medication levorphanol by the method used for analysis. Drug Absent but Declared for Prescription Verification   Sertraline                     Not Detected UNEXPECTED   Ibuprofen                      Not Detected UNEXPECTED    Ibuprofen, as indicated in the declared medication list, is not    always detected even when used as directed.   Guaifenesin                    Not Detected UNEXPECTED ==================================================================== Test                      Result    Flag   Units      Ref Range  Creatinine              15        L      mg/dL       >=20 ==================================================================== Declared Medications:  The flagging and interpretation on this report are based on the  following declared medications.  Unexpected results may arise from  inaccuracies in the declared medications.  **Note: The testing scope of this panel includes these medications:  Cyclobenzaprine (Flexeril)  Gabapentin  Guaifenesin (Mucinex)  Oxycodone  Sertraline  Trazodone  Venlafaxine  **Note: The testing scope of this panel does not include small to  moderate amounts of these reported medications:  Ibuprofen  **Note: The testing scope of this panel does not include following  reported medications:  Albuterol (Proventil)  Amoxicillin (Augmentin)  Atorvastatin (Lipitor)  Clavulinate (Augmentin)  Folic acid  Meloxicam  Methotrexate  Multivitamin  Omeprazole (Prilosec)  Valacyclovir (Valtrex)  Varenicline (Chantix)  Vitamin B ==================================================================== For clinical consultation, please call [phone]. ====================================================================    Laboratory Chemistry Profile   Renal Lab Results  Component Value Date   BUN 11 08/09/2019   CREATININE 0.93 08/09/2019   BCR 16 04/17/2019   GFRAA >60 08/09/2019   GFRNONAA >60 08/09/2019     Hepatic Lab Results  Component Value Date   AST 24 08/09/2019   ALT 19 08/09/2019   ALBUMIN 4.0 08/09/2019   ALKPHOS 101 08/09/2019   LIPASE 63 03/23/2018     Electrolytes Lab Results  Component Value Date   NA 141 08/09/2019   K 3.5 08/09/2019   CL 115 (H) 08/09/2019   CALCIUM 8.9 08/09/2019     Bone No results found for: VD25OH, VD125OH2TOT, HD6222LN9, GX2119ER7, 25OHVITD1, 25OHVITD2, 25OHVITD3, TESTOFREE, TESTOSTERONE   Inflammation (CRP: Acute Phase) (ESR: Chronic Phase) No results found for: CRP, ESRSEDRATE, LATICACIDVEN     Note: Above Lab results reviewed.  Assessment  The  primary encounter diagnosis was Spondylosis of cervical region without myelopathy or radiculopathy. Diagnoses of DDD (degenerative disc disease), cervical, Neuropathy, Cervical fusion syndrome, and Chronic pain syndrome were also pertinent to this visit.  Plan of Care   Gabrielle Pineda has a current medication list which includes the following long-term medication(s): aripiprazole, atorvastatin, cartia xt, diltiazem, ezetimibe, fluticasone, furosemide, gabapentin, ipratropium, linzess, omeprazole, potassium chloride sa, sertraline, topiramate, trazodone, venlafaxine xr, and [DISCONTINUED] sertraline.  Good relief s/p cervical facets, endorsing pain relief and improvement in neck ROM. Repeat PRN.   Orders:  Orders Placed This Encounter  Procedures  . CERVICAL FACET (MEDIAL BRANCH NERVE BLOCK)     CLINICAL INDICATIONS: Neck pain. Indications: Cervical Facet Syndrome.    Standing Status:   Standing    Number of Occurrences:   2    Standing Expiration Date:   05/16/2021    Scheduling Instructions:     LATERALITY: TBD     Level: C3-4, C4-5, C5-6 Facet joints (C4, C5, C6, & C7 Medial Branch Nerves)     SEDATION: with     TIMEFRAME: PRN procedure. (Gabrielle Pineda will call when needed.)    Order Specific Question:   Where will this procedure be performed?    Answer:   [facility]   Follow-up plan:   Return if symptoms worsen or fail to improve.    Recent Visits Date Type Provider Dept  10/04/19 Procedure visit [name], MD [facility]  09/21/19 Office Visit [name], MD [facility]  Showing recent visits within past 90 days  and meeting all other requirements   Today's Visits Date Type Provider Dept  11/14/19 Office Visit [name], MD [facility]  Showing today's visits and meeting all other requirements   Future Appointments No visits were found meeting these conditions.  Showing future appointments within next 90 days and  meeting all other requirements   I discussed the assessment and treatment plan with the patient. The patient was provided an opportunity to ask questions and all were answered. The patient agreed with the plan and demonstrated an understanding of the instructions.  Patient advised to call back or seek an in-person evaluation if the symptoms or condition worsens.  Duration of encounter: 15 minutes.  Note by: [name], MD Date: 11/14/2019; Time: 8:32 AM

## 2019-11-20 ENCOUNTER — Other Ambulatory Visit: Payer: Self-pay | Admitting: Physician Assistant

## 2019-11-20 DIAGNOSIS — F339 Major depressive disorder, recurrent, unspecified: Secondary | ICD-10-CM

## 2019-12-04 ENCOUNTER — Encounter: Payer: Self-pay | Admitting: Physician Assistant

## 2019-12-04 ENCOUNTER — Other Ambulatory Visit: Payer: Self-pay

## 2019-12-04 ENCOUNTER — Ambulatory Visit (INDEPENDENT_AMBULATORY_CARE_PROVIDER_SITE_OTHER): Payer: PPO | Admitting: Physician Assistant

## 2019-12-04 DIAGNOSIS — I471 Supraventricular tachycardia: Secondary | ICD-10-CM

## 2019-12-04 DIAGNOSIS — F411 Generalized anxiety disorder: Secondary | ICD-10-CM

## 2019-12-04 DIAGNOSIS — J449 Chronic obstructive pulmonary disease, unspecified: Secondary | ICD-10-CM | POA: Diagnosis not present

## 2019-12-04 DIAGNOSIS — F321 Major depressive disorder, single episode, moderate: Secondary | ICD-10-CM

## 2019-12-04 DIAGNOSIS — F331 Major depressive disorder, recurrent, moderate: Secondary | ICD-10-CM | POA: Insufficient documentation

## 2019-12-04 DIAGNOSIS — F339 Major depressive disorder, recurrent, unspecified: Secondary | ICD-10-CM

## 2019-12-04 DIAGNOSIS — M0579 Rheumatoid arthritis with rheumatoid factor of multiple sites without organ or systems involvement: Secondary | ICD-10-CM | POA: Diagnosis not present

## 2019-12-04 MED ORDER — BUSPIRONE HCL 30 MG PO TABS: 30.0000 mg | ORAL_TABLET | Freq: Three times a day (TID) | ORAL | 1 refills | Status: DC

## 2019-12-04 MED ORDER — VENLAFAXINE HCL ER 75 MG PO CP24: ORAL_CAPSULE | ORAL | 3 refills | Status: DC

## 2019-12-04 MED ORDER — SERTRALINE HCL 100 MG PO TABS: 100.0000 mg | ORAL_TABLET | Freq: Every day | ORAL | 3 refills | Status: DC

## 2019-12-04 NOTE — Progress Notes (Signed)
Established patient visit    Patient: Gabrielle Pineda   DOB: [birth year]   64 y.o. Female  MRN: [id number] Visit Date: 12/04/2019  Today's healthcare provider: [name], PA-C   Chief Complaint  Patient presents with   Anxiety   Subjective    HPI  Patient here for refill on Buspar anxiety medication. She reports that she feels stable on this medicine.  GAD 7 : Generalized Anxiety Score 12/04/2019  Nervous, Anxious, on Edge 1  Control/stop worrying 2  Worry too much - different things 2  Trouble relaxing 1  Restless 3  Easily annoyed or irritable 0  Afraid - awful might happen 0  Total GAD 7 Score 9  Anxiety Difficulty Not difficult at all   Depression screen [facility] 2/9 12/04/2019 04/17/2019 01/25/2019 11/02/2018 09/13/2018  Decreased Interest 1 1 0 1 0  Down, Depressed, Hopeless 0 1 0 0 0  PHQ - 2 Score 1 2 0 1 0  Altered sleeping 3 3 - - -  Tired, decreased energy 2 3 - - -  Change in appetite 2 3 - - -  Feeling bad or failure about yourself  0 1 - - -  Trouble concentrating 1 1 - - -  Moving slowly or fidgety/restless 2 1 - - -  Suicidal thoughts 0 0 - - -  PHQ-9 Score 11 14 - - -  Difficult doing work/chores Not difficult at all Not difficult at all - - -  Some recent data might be hidden    Patient Active Problem List   Diagnosis Date Noted   Moderate episode of recurrent major depressive disorder ([place]) 12/04/2019   Spondylosis of cervical region without myelopathy or radiculopathy 09/21/2019   DDD (degenerative disc disease), cervical 09/21/2019   Cervicalgia 09/21/2019   Cervical fusion syndrome 09/21/2019   Chronic pain syndrome 09/21/2019   Degenerative lumbar spinal stenosis 08/15/2019   History of adenomatous polyp of colon 11/04/2017   Cervical radiculopathy 08/02/2017   Neuropathy 08/02/2017   Essential hypertension 07/28/2017   Closed compression fracture of L5 lumbar vertebra 07/07/2017   Sacral insufficiency fracture with  routine healing 07/07/2017   COPD (chronic obstructive pulmonary disease) ([place]) 04/10/2015   Family history of malignant neoplasm of pancreas 04/03/2015   Hypercholesteremia 04/03/2015   Disorder of iron metabolism 04/03/2015   Weight loss 04/03/2015   Delayed onset of urination 04/03/2015   Acid reflux 10/11/2014   Closed fracture of distal phalanx of thumb 08/15/2014   Arthritis, degenerative 01/30/2014   Arthritis or polyarthritis, rheumatoid ([place]) 01/30/2014   Shortness of breath 12/22/2013   SMOKER 09/22/2010   Paroxysmal supraventricular tachycardia (HCC) 09/22/2010   CHEST PAIN UNSPECIFIED 09/22/2010   ELECTROCARDIOGRAM, ABNORMAL 09/22/2010   B-complex deficiency 09/09/2007   CN (constipation) 06/26/2007   Clinical depression 06/26/2007   Cold sore 06/26/2007   H/O alcohol abuse 06/26/2007   Cannot sleep 06/26/2007   Localized osteoarthrosis, hand 06/26/2007   Menopausal symptom 06/26/2007   Past Medical History:  Diagnosis Date   Alcohol abuse    Anemia    Anxiety    Arthritis    Cervicalgia    Cirrhosis ([place]) 1994   COPD (chronic obstructive pulmonary disease) (HCC)    Depression    Dyspnea    GERD (gastroesophageal reflux disease)    Headache    Heart murmur    on heard when pt is lying   Other and unspecified hyperlipidemia    Tachycardia    d/t  questionable anxiety happens every 5- 10 years, sees [facility]   Allergies  Allergen Reactions   Plaquenil [Hydroxychloroquine] Rash       Medications: Outpatient Medications Prior to Visit  Medication Sig   acyclovir cream (ZOVIRAX) 5 % APPLY TOPICALLY EVERY 4 HOURS (Patient taking differently: Apply 1 application topically every 4 (four) hours as needed (fever blisters.). )   ANORO ELLIPTA 62.5-25 MCG/INH AEPB Inhale 1 puff into the lungs daily. (Patient taking differently: Inhale 1 puff into the lungs daily in the afternoon. )   ARIPiprazole (ABILIFY) 10 MG  tablet Take 1 tablet by mouth once daily   atorvastatin (LIPITOR) 40 MG tablet Take 1 tablet by mouth once daily (Patient taking differently: Take 40 mg by mouth daily. )   busPIRone (BUSPAR) 30 MG tablet TAKE 1 TABLET BY MOUTH THREE TIMES DAILY   CARTIA XT 180 MG 24 hr capsule Take 1 capsule by mouth once daily (Patient taking differently: Take 180 mg by mouth daily. )   diltiazem (TIAZAC) 180 MG 24 hr capsule Take 180 mg by mouth daily.   ezetimibe (ZETIA) 10 MG tablet Take 1 tablet by mouth once daily (Patient taking differently: Take 10 mg by mouth daily. )   fluticasone (FLONASE) 50 MCG/ACT nasal spray Place 2 sprays into both nostrils daily.   furosemide (LASIX) 20 MG tablet TAKE 1 TABLET BY MOUTH DAILY AS NEEDED FOR  FLUID (Patient taking differently: Take 20 mg by mouth daily as needed (fluid retention.). )   gabapentin (NEURONTIN) 600 MG tablet Take 1 tablet (600 mg total) by mouth 3 (three) times daily.   Guaifenesin (MUCINEX MAXIMUM STRENGTH) 1200 MG TB12 Take 1,200 mg by mouth daily.   ibuprofen (ADVIL) 200 MG tablet Take 3 tablets (600 mg total) by mouth every 8 (eight) hours as needed. For pain   ipratropium (ATROVENT) 0.02 % nebulizer solution Take 2.5 mLs (0.5 mg total) by nebulization 4 (four) times daily. (Patient taking differently: Take 0.5 mg by nebulization 4 (four) times daily as needed for wheezing or shortness of breath. )   leflunomide (ARAVA) 10 MG tablet Take 10 mg by mouth at bedtime.    LINZESS 145 MCG CAPS capsule Take 145 mcg by mouth daily before breakfast.   meloxicam (MOBIC) 15 MG tablet Take 1 tablet (15 mg total) by mouth at bedtime.   methocarbamol (ROBAXIN-750) 750 MG tablet Take 1 tablet (750 mg total) by mouth 3 (three) times daily as needed for muscle spasms.   Multiple Vitamin (MULTIVITAMIN WITH MINERALS) TABS tablet Take 1 tablet by mouth daily.   nicotine (NICODERM CQ - DOSED IN MG/24 HOURS) 21 mg/24hr patch Place 21 mg onto the skin  daily.   Nutritional Supplements (ESTROVEN ENERGY PO) Take 1 tablet by mouth daily.    omeprazole (PRILOSEC) 40 MG capsule Take 1 capsule (40 mg total) by mouth 2 (two) times daily.   oxyCODONE (ROXICODONE) 5 MG immediate release tablet Take 1 tablet (5 mg total) by mouth every 4 (four) hours as needed for severe pain.   pantoprazole (PROTONIX) 40 MG tablet Take 40 mg by mouth daily before breakfast.   potassium chloride SA (K-DUR) 20 MEQ tablet TAKE 1  BY MOUTH ONCE DAILY TAKE  WITH  FUROSEMIDE (Patient taking differently: Take 20 mEq by mouth daily as needed (take with furosemide (fluid retention)). )   sertraline (ZOLOFT) 100 MG tablet Take 1 tablet by mouth once daily   topiramate (TOPAMAX) 25 MG tablet Take 1 tablet (25  mg total) by mouth 2 (two) times daily.   traZODone (DESYREL) 100 MG tablet Take 1-3 tablets (100-300 mg total) by mouth at bedtime.   valACYclovir (VALTREX) 1000 MG tablet TAKE 2 TABLETS BY MOUTH TWICE DAILY FOR 5 DAYS AS NEEDED (Patient taking differently: Take 2 g by mouth 2 (two) times daily as needed (fever blisters (use for 5 days whenever needed)). )   venlafaxine XR (EFFEXOR-XR) 75 MG 24 hr capsule TAKE 3 CAPSULES BY MOUTH ONCE DAILY WITH BREAKFAST   vitamin B-12 (CYANOCOBALAMIN) 1000 MCG tablet Take 6,000 mcg by mouth daily.   vitamin E 400 UNIT capsule Take 1,600 Units by mouth daily.   XIIDRA 5 % SOLN Place 1 drop into both eyes daily.   No facility-administered medications prior to visit.    Review of Systems  Constitutional: Negative.   Respiratory: Negative.   Cardiovascular: Negative.   Musculoskeletal: Positive for arthralgias (RA).  Psychiatric/Behavioral: Positive for dysphoric mood and sleep disturbance. The patient is nervous/anxious.     Last CBC Lab Results  Component Value Date   WBC 6.7 08/09/2019   HGB 12.8 08/09/2019   HCT 39.6 08/09/2019   MCV 103.4 (H) 08/09/2019   MCH 33.4 08/09/2019   RDW 12.7 08/09/2019   PLT 188  0000000   Last metabolic panel Lab Results  Component Value Date   GLUCOSE 100 (H) 08/09/2019   NA 141 08/09/2019   K 3.5 08/09/2019   CL 115 (H) 08/09/2019   CO2 18 (L) 08/09/2019   BUN 11 08/09/2019   CREATININE 0.93 08/09/2019   GFRNONAA >60 08/09/2019   GFRAA >60 08/09/2019   CALCIUM 8.9 08/09/2019   PROT 6.3 (L) 08/09/2019   ALBUMIN 4.0 08/09/2019   LABGLOB 1.9 04/17/2019   AGRATIO 2.3 (H) 04/17/2019   BILITOT 0.3 08/09/2019   ALKPHOS 101 08/09/2019   AST 24 08/09/2019   ALT 19 08/09/2019   ANIONGAP 8 08/09/2019   Last hemoglobin A1c Lab Results  Component Value Date   HGBA1C 5.0 04/14/2018   Last thyroid functions Lab Results  Component Value Date   TSH 1.310 04/17/2019       Objective    BP 134/83 (BP Location: Left Arm, Patient Position: Sitting, Cuff Size: Large)    Pulse 73    Temp (!) 97 F (36.1 C) (Temporal)    Resp 16    Wt 153 lb (69.4 kg)    BMI 22.59 kg/m  BP Readings from Last 3 Encounters:  12/04/19 134/83  10/04/19 (!) 151/71  08/16/19 98/60   Wt Readings from Last 3 Encounters:  12/04/19 153 lb (69.4 kg)  10/04/19 150 lb (68 kg)  08/15/19 150 lb (68 kg)      Physical Exam Vitals reviewed.  Constitutional:      General: She is not in acute distress.    Appearance: Normal appearance. She is well-developed and normal weight. She is not ill-appearing or diaphoretic.  Eyes:     General: No scleral icterus. Neck:     Thyroid: No thyromegaly.     Vascular: No JVD.     Trachea: No tracheal deviation.  Cardiovascular:     Rate and Rhythm: Normal rate and regular rhythm.     Heart sounds: Normal heart sounds. No murmur. No friction rub. No gallop.   Pulmonary:     Effort: Pulmonary effort is normal. No respiratory distress.     Breath sounds: Normal breath sounds. No wheezing or rales.  Musculoskeletal:  Cervical back: Normal range of motion and neck supple.  Lymphadenopathy:     Cervical: No cervical adenopathy.    Neurological:     Mental Status: She is alert.  Psychiatric:        Mood and Affect: Mood normal.        Behavior: Behavior normal.        Thought Content: Thought content normal.        Judgment: Judgment normal.       No results found for any visits on 12/04/19.   Assessment & Plan     1. GAD (generalized anxiety disorder) Stable. Diagnosis pulled for medication refill. Continue current medical treatment plan. - busPIRone (BUSPAR) 30 MG tablet; Take 1 tablet (30 mg total) by mouth 3 (three) times daily.  Dispense: 270 tablet; Refill: 1  2. Rheumatoid arthritis involving multiple sites with positive rheumatoid factor (HCC) Stable. Followed by Rheumatology.  3. Chronic obstructive pulmonary disease, unspecified COPD type ([place]) Currently stable. No issues. Continue Anoro daily, Atrovent neb QID prn  4. Moderate episode of recurrent major depressive disorder (HCC) Stable. Continue Sertraline 100mg  daily, Venlafaxine XR 225mg  daily, Abilify 10mg  daily.  - venlafaxine XR (EFFEXOR-XR) 75 MG 24 hr capsule; TAKE 3 CAPSULES BY MOUTH ONCE DAILY WITH BREAKFAST  Dispense: 270 capsule; Refill: 3 - sertraline (ZOLOFT) 100 MG tablet; Take 1 tablet (100 mg total) by mouth daily.  Dispense: 90 tablet; Refill: 3  5. Paroxysmal supraventricular tachycardia (HCC) Stable. No medication required.    No follow-ups on file.      Reynolds Bowl, PA-C, have reviewed all documentation for this visit. The documentation on 12/06/19 for the exam, diagnosis, procedures, and orders are all accurate and complete.   Rubye Beach  [facility] [phone] (phone) [phone] (fax)  [place]

## 2019-12-06 ENCOUNTER — Telehealth: Payer: Self-pay

## 2019-12-06 ENCOUNTER — Encounter: Payer: Self-pay | Admitting: Student in an Organized Health Care Education/Training Program

## 2019-12-06 NOTE — Telephone Encounter (Signed)
Attempted to call patient about VV 12/07/19. No answer, Left message on AM to call our office.

## 2019-12-07 ENCOUNTER — Ambulatory Visit
Payer: PPO | Attending: Student in an Organized Health Care Education/Training Program | Admitting: Student in an Organized Health Care Education/Training Program

## 2019-12-07 ENCOUNTER — Encounter: Payer: Self-pay | Admitting: Student in an Organized Health Care Education/Training Program

## 2019-12-07 ENCOUNTER — Other Ambulatory Visit: Payer: Self-pay

## 2019-12-07 DIAGNOSIS — M47812 Spondylosis without myelopathy or radiculopathy, cervical region: Secondary | ICD-10-CM

## 2019-12-07 DIAGNOSIS — Q761 Klippel-Feil syndrome: Secondary | ICD-10-CM

## 2019-12-07 DIAGNOSIS — M5416 Radiculopathy, lumbar region: Secondary | ICD-10-CM

## 2019-12-07 DIAGNOSIS — M5030 Other cervical disc degeneration, unspecified cervical region: Secondary | ICD-10-CM | POA: Diagnosis not present

## 2019-12-07 DIAGNOSIS — M542 Cervicalgia: Secondary | ICD-10-CM

## 2019-12-07 DIAGNOSIS — G629 Polyneuropathy, unspecified: Secondary | ICD-10-CM | POA: Diagnosis not present

## 2019-12-07 DIAGNOSIS — G894 Chronic pain syndrome: Secondary | ICD-10-CM

## 2019-12-07 MED ORDER — TOPIRAMATE 25 MG PO TABS: 25.0000 mg | ORAL_TABLET | Freq: Two times a day (BID) | ORAL | 11 refills | Status: DC

## 2019-12-07 NOTE — Progress Notes (Signed)
Patient: Gabrielle Pineda  Service Category: E/M  Provider: [name], MD  DOB: 12/07/1955  DOS: 12/07/2019  Location: Office  MRN: [id number]  Setting: Ambulatory outpatient  Referring Provider: Florian Buff*  Type: Established Patient  Specialty: Interventional Pain Management  PCP: [name], PA-C  Location: Home  Delivery: TeleHealth     Virtual Encounter - Pain Management PROVIDER NOTE: Information contained herein reflects review and annotations entered in association with encounter. Interpretation of such information and data should be left to medically-trained personnel. Information provided to patient can be located elsewhere in the medical record under "Patient Instructions". Document created using STT-dictation technology, any transcriptional errors that may result from process are unintentional.    Contact & Pharmacy Preferred: [facility]: [phone] (home) Mobile: [phone] (mobile) E-mail: [email or web address]  [facility] (N), [place] ([place]) [place] [address] Phone: [phone] Fax: [phone]   Pre-screening  Gabrielle Pineda offered "in-person" vs "virtual" encounter. She indicated preferring virtual for this encounter.   Reason COVID-19*  Social distancing based on CDC and AMA recommendations.   I contacted Gabrielle Pineda on 12/07/2019 via telephone.      I clearly identified myself as [name], MD. I verified that I was speaking with the correct person using two identifiers (Name: Gabrielle Pineda, and date of birth: 1955/11/10).  This visit was completed via telephone due to the restrictions of the COVID-19 pandemic. All issues as above were discussed and addressed but no physical exam was performed. If it was felt that the patient should be evaluated in the office, they were directed there. The patient verbally consented to this visit. Patient was  unable to complete an audio/visual visit due to Technical difficulties and/or Lack of internet. Due to the catastrophic nature of the COVID-19 pandemic, this visit was done through audio contact only.  Location of the patient: home address (see Epic for details)  Location of the provider: office  Consent I sought verbal advanced consent from Gabrielle Pineda for virtual visit interactions. I informed Gabrielle Pineda of possible security and privacy concerns, risks, and limitations associated with providing "not-in-person" medical evaluation and management services. I also informed Gabrielle Pineda of the availability of "in-person" appointments. Finally, I informed her that there would be a charge for the virtual visit and that she could be  personally, fully or partially, financially responsible for it. Gabrielle Pineda expressed understanding and agreed to proceed.   Historic Elements   Gabrielle Pineda is a 64 y.o. year old, female patient evaluated today after her last contact with our practice on 12/06/2019. Gabrielle Pineda  has a past medical history of Alcohol abuse, Anemia, Anxiety, Arthritis, Cervicalgia, Cirrhosis ([place]) (1994), COPD (chronic obstructive pulmonary disease) ([place]), Depression, Dyspnea, GERD (gastroesophageal reflux disease), Headache, Heart murmur, Other and unspecified hyperlipidemia, and Tachycardia. She also  has a past surgical history that includes neck disc surgery; Breast enhancement surgery (1981); Esophagogastroduodenoscopy; Tonsillectomy and adenoidectomy (1973 ); Carpal tunnel release (11/11/2011); Carpal tunnel release (2013); Rotator cuff repair (06/16/2016); Rotator cuff repair; Colonoscopy with propofol (N/A, 11/01/2017); Augmentation mammaplasty (Bilateral, 1982); Anterior cervical decomp/discectomy fusion (2012, 2015, 2018); and Lumbar laminectomy/decompression microdiscectomy (N/A, 08/15/2019). Gabrielle Pineda has a current medication list which includes the following  prescription(s): acyclovir cream, anoro ellipta, aripiprazole, atorvastatin, buspirone, cartia xt, diltiazem, ezetimibe, fluticasone, furosemide, gabapentin, guaifenesin, ibuprofen, ipratropium, leflunomide, linzess, meloxicam, methocarbamol, multivitamin with minerals, nicotine, misc natural products, omeprazole, oxycodone, pantoprazole,  potassium chloride sa, sertraline, trazodone, valacyclovir, venlafaxine xr, vitamin b-12, vitamin e, xiidra, topiramate, and [DISCONTINUED] sertraline. She  reports that she has been smoking. She has a 40.00 pack-year smoking history. She has never used smokeless tobacco. She reports that she does not drink alcohol or use drugs. Gabrielle Pineda is allergic to plaquenil [hydroxychloroquine].   HPI  Today, she is being contacted for medication management.  Overall, pt is doing well. No change in her medical history since last visit. Neck pain is managed well, no flares recently. Refill topamax as below for migraine Ppx/manaagement.   UDS:  Summary  Date Value Ref Range Status  09/07/2017 FINAL  Final    Comment:    ==================================================================== TOX[facility]RE COMP DRUG ANALYSIS,UR ==================================================================== Specimen Alert Note:  Urinary creatinine is low; ability to detect some drugs may be compromised.  Interpret results with caution. ==================================================================== Test                             Result       Flag       Units Drug Present and Declared for Prescription Verification   Oxycodone                      493          EXPECTED   ng/mg creat   Oxymorphone                    433          EXPECTED   ng/mg creat   Noroxycodone                   3433         EXPECTED   ng/mg creat    Sources of oxycodone include scheduled prescription medications.    Oxymorphone and noroxycodone are expected metabolites of    oxycodone. Oxymorphone is also  available as a scheduled    prescription medication.   Gabapentin                     PRESENT      EXPECTED   Desmethylcyclobenzaprine       PRESENT      EXPECTED    Desmethylcyclobenzaprine is an expected metabolite of    cyclobenzaprine.   Trazodone                      PRESENT      EXPECTED   1,3 chlorophenyl piperazine    PRESENT      EXPECTED    1,3-chlorophenyl piperazine is an expected metabolite of    trazodone.   Venlafaxine                    PRESENT      EXPECTED   Desmethylvenlafaxine           PRESENT      EXPECTED    Desmethylvenlafaxine is an expected metabolite of venlafaxine. Drug Present not Declared for Prescription Verification   Acetaminophen                  PRESENT      UNEXPECTED   Naproxen                       PRESENT      UNEXPECTED   Diphenhydramine  PRESENT      UNEXPECTED   Doxylamine                     PRESENT      UNEXPECTED   Dextromethorphan               PRESENT      UNEXPECTED   Dextrorphan/Levorphanol        PRESENT      UNEXPECTED    Dextrorphan is an expected metabolite of dextromethorphan, an    over-the-counter or prescription cough suppressant. Dextrorphan    cannot be distinguished from the scheduled prescription    medication levorphanol by the method used for analysis. Drug Absent but Declared for Prescription Verification   Sertraline                     Not Detected UNEXPECTED   Ibuprofen                      Not Detected UNEXPECTED    Ibuprofen, as indicated in the declared medication list, is not    always detected even when used as directed.   Guaifenesin                    Not Detected UNEXPECTED ==================================================================== Test                      Result    Flag   Units      Ref Range   Creatinine              15        L      mg/dL      >=20 ==================================================================== Declared Medications:  The flagging and interpretation on this  report are based on the  following declared medications.  Unexpected results may arise from  inaccuracies in the declared medications.  **Note: The testing scope of this panel includes these medications:  Cyclobenzaprine (Flexeril)  Gabapentin  Guaifenesin (Mucinex)  Oxycodone  Sertraline  Trazodone  Venlafaxine  **Note: The testing scope of this panel does not include small to  moderate amounts of these reported medications:  Ibuprofen  **Note: The testing scope of this panel does not include following  reported medications:  Albuterol (Proventil)  Amoxicillin (Augmentin)  Atorvastatin (Lipitor)  Clavulinate (Augmentin)  Folic acid  Meloxicam  Methotrexate  Multivitamin  Omeprazole (Prilosec)  Valacyclovir (Valtrex)  Varenicline (Chantix)  Vitamin B ==================================================================== For clinical consultation, please call [phone]. ====================================================================    Laboratory Chemistry Profile   Renal Lab Results  Component Value Date   BUN 11 08/09/2019   CREATININE 0.93 08/09/2019   BCR 16 04/17/2019   GFRAA >60 08/09/2019   GFRNONAA >60 08/09/2019     Hepatic Lab Results  Component Value Date   AST 24 08/09/2019   ALT 19 08/09/2019   ALBUMIN 4.0 08/09/2019   ALKPHOS 101 08/09/2019   LIPASE 63 03/23/2018     Electrolytes Lab Results  Component Value Date   NA 141 08/09/2019   K 3.5 08/09/2019   CL 115 (H) 08/09/2019   CALCIUM 8.9 08/09/2019     Bone No results found for: VD25OH, VD125OH2TOT, KD9833AS5, KN3976BH4, 25OHVITD1, 25OHVITD2, 25OHVITD3, TESTOFREE, TESTOSTERONE   Inflammation (CRP: Acute Phase) (ESR: Chronic Phase) No results found for: CRP, ESRSEDRATE, LATICACIDVEN     Note: Above Lab results reviewed.   Assessment  The primary encounter diagnosis was  Spondylosis of cervical region without myelopathy or radiculopathy. Diagnoses of DDD (degenerative disc  disease), cervical, Neuropathy, Cervical fusion syndrome, Chronic pain syndrome, Cervicalgia, and Lumbar radiculopathy (R>L) were also pertinent to this visit.  Plan of Care  Gabrielle Pineda has a current medication list which includes the following long-term medication(s): aripiprazole, atorvastatin, cartia xt, diltiazem, ezetimibe, fluticasone, furosemide, gabapentin, ipratropium, linzess, omeprazole, potassium chloride sa, sertraline, trazodone, venlafaxine xr, topiramate, and [DISCONTINUED] sertraline.  Pharmacotherapy (Medications Ordered): Meds ordered this encounter  Medications  . topiramate (TOPAMAX) 25 MG tablet    Sig: Take 1 tablet (25 mg total) by mouth 2 (two) times daily.    Dispense:  60 tablet    Refill:  11   Follow-up plan:   Return if symptoms worsen or fail to improve.   Recent Visits Date Type Provider Dept  11/14/19 Office Visit [name], MD [facility]  10/04/19 Procedure visit [name], MD [facility]  09/21/19 Office Visit [name], MD [facility]  Showing recent visits within past 90 days and meeting all other requirements   Today's Visits Date Type Provider Dept  12/07/19 Telemedicine [name], MD [facility]  Showing today's visits and meeting all other requirements   Future Appointments No visits were found meeting these conditions.  Showing future appointments within next 90 days and meeting all other requirements   I discussed the assessment and treatment plan with the patient. The patient was provided an opportunity to ask questions and all were answered. The patient agreed with the plan and demonstrated an understanding of the instructions.  Patient advised to call back or seek an in-person evaluation if the symptoms or condition worsens.  Duration of encounter: 26mnutes.  Note by: BGillis Santa MD Date: 12/07/2019; Time: 12:08 PM

## 2019-12-16 NOTE — Progress Notes (Signed)
Cardiology Office Note  Date:  12/18/2019   ID:  Gabrielle Pineda, DOB 06-May-1956, MRN [id number]  PCP:  Gabrielle Daring, PA-C   Chief Complaint  Patient presents with  . OTHER    6 month f/u no complaints today. Meds reviewed verbally with pt.    HPI:  Gabrielle Pineda is a very pleasant 64 yo old woman with a  long history of smoking since age 39,  hyperlipidemia,  history of tachycardia/possible SVT  at times requiring adenosine? (details not available), and chest pain  chronic neck and shoulder pain. Prior surgeries.  rheumatoid arthritis. COPD who presents for routine follow up of her tachycardia.   In follow-up today reports she is doing relatively well Underwent a L3-S1 decompressive lumbar laminectomy.  Recovered well  No covid over the past year Got the vaccine Smokes, 1 ppd Discussed various strategies she has used for smoking cessation  Lasix and potassium sparingly, for ankle swelling On lipitor and zetia  Denies tachycardia, palpitations, no shortness of breath or chest pain  EKG personally reviewed by myself on todays visit NSR rate 80 bpm, no ST or T changes  Other past medical history  On previous office visits, reported having chronic neck and shoulder pain.   2019: numerous traumatic accidents Fall July 2019, broke ribs, wrist Wrist healed "bent"  Previously Tailbone broke, severe pain Neck cervical fusion, "did not take" Going to pain clinic, Has shots   PMH:   has a past medical history of Alcohol abuse, Anemia, Anxiety, Arthritis, Cervicalgia, Cirrhosis ([place]) (1994), COPD (chronic obstructive pulmonary disease) ([place]), Depression, Dyspnea, GERD (gastroesophageal reflux disease), Headache, Heart murmur, Other and unspecified hyperlipidemia, and Tachycardia.  PSH:    Past Surgical History:  Procedure Laterality Date  . ANTERIOR CERVICAL DECOMP/DISCECTOMY FUSION  2012, 2015, 2018   x3  . AUGMENTATION MAMMAPLASTY Bilateral 1982  . BREAST  ENHANCEMENT SURGERY  1981  . CARPAL TUNNEL RELEASE  11/11/2011   Procedure: CARPAL TUNNEL RELEASE;  Surgeon: Gabrielle Stakes, MD;  Location: [facility];  Service: Neurosurgery;  Laterality: Right;  Right Median Nerve Decompression  . CARPAL TUNNEL RELEASE  2013  . COLONOSCOPY WITH PROPOFOL N/A 11/01/2017   Procedure: COLONOSCOPY WITH PROPOFOL;  Surgeon: Gabrielle Silvas, MD;  Location: [facility] ENDOSCOPY;  Service: Endoscopy;  Laterality: N/A;  . ESOPHAGOGASTRODUODENOSCOPY    . LUMBAR LAMINECTOMY/DECOMPRESSION MICRODISCECTOMY N/A 08/15/2019   Procedure: Lumbar three to Sacral one Decompressive lumbar laminectomy;  Surgeon: Gabrielle Levine, MD;  Location: [place];  Service: Neurosurgery;  Laterality: N/A;  . neck disc surgery     plates and screws in neck x 2  . ROTATOR CUFF REPAIR  06/16/2016   right shoulder   . ROTATOR CUFF REPAIR    . TONSILLECTOMY AND ADENOIDECTOMY  1973     Current Outpatient Medications  Medication Sig Dispense Refill  . acyclovir cream (ZOVIRAX) 5 % APPLY TOPICALLY EVERY 4 HOURS (Patient taking differently: Apply 1 application topically every 4 (four) hours as needed (fever blisters.). ) 15 g 0  . ANORO ELLIPTA 62.5-25 MCG/INH AEPB Inhale 1 puff into the lungs daily. (Patient taking differently: Inhale 1 puff into the lungs daily in the afternoon. ) 60 each 5  . ARIPiprazole (ABILIFY) 10 MG tablet Take 1 tablet by mouth once daily 90 tablet 1  . atorvastatin (LIPITOR) 40 MG tablet Take 1 tablet by mouth once daily (Patient taking differently: Take 40 mg by mouth daily. ) 90 tablet 1  . busPIRone (BUSPAR)  30 MG tablet Take 1 tablet (30 mg total) by mouth 3 (three) times daily. 270 tablet 1  . ezetimibe (ZETIA) 10 MG tablet Take 1 tablet by mouth once daily (Patient taking differently: Take 10 mg by mouth daily. ) 90 tablet 1  . fluticasone (FLONASE) 50 MCG/ACT nasal spray Place 2 sprays into both nostrils daily. 16 g 6  . furosemide (LASIX) 20 MG tablet TAKE 1 TABLET BY  MOUTH DAILY AS NEEDED FOR  FLUID (Patient taking differently: Take 20 mg by mouth daily as needed (fluid retention.). ) 90 tablet 1  . gabapentin (NEURONTIN) 600 MG tablet Take 1 tablet (600 mg total) by mouth 3 (three) times daily. 270 tablet 1  . Guaifenesin (MUCINEX MAXIMUM STRENGTH) 1200 MG TB12 Take 1,200 mg by mouth daily.    Marland Kitchen ibuprofen (ADVIL) 200 MG tablet Take 3 tablets (600 mg total) by mouth every 8 (eight) hours as needed. For pain 30 tablet 0  . ipratropium (ATROVENT) 0.02 % nebulizer solution Take 2.5 mLs (0.5 mg total) by nebulization 4 (four) times daily. (Patient taking differently: Take 0.5 mg by nebulization 4 (four) times daily as needed for wheezing or shortness of breath. ) 75 mL 12  . leflunomide (ARAVA) 10 MG tablet Take 10 mg by mouth at bedtime.     Marland Kitchen LINZESS 145 MCG CAPS capsule Take 145 mcg by mouth daily before breakfast.    . meloxicam (MOBIC) 15 MG tablet Take 1 tablet (15 mg total) by mouth at bedtime.    . methocarbamol (ROBAXIN-750) 750 MG tablet Take 1 tablet (750 mg total) by mouth 3 (three) times daily as needed for muscle spasms. 90 tablet 1  . Multiple Vitamin (MULTIVITAMIN WITH MINERALS) TABS tablet Take 1 tablet by mouth daily.    . nicotine (NICODERM CQ - DOSED IN MG/24 HOURS) 21 mg/24hr patch Place 21 mg onto the skin daily.    . Nutritional Supplements (ESTROVEN ENERGY PO) Take 1 tablet by mouth daily.     Marland Kitchen omeprazole (PRILOSEC) 40 MG capsule Take 1 capsule (40 mg total) by mouth 2 (two) times daily. 180 capsule 3  . oxyCODONE (ROXICODONE) 5 MG immediate release tablet Take 1 tablet (5 mg total) by mouth every 4 (four) hours as needed for severe pain. 60 tablet 0  . pantoprazole (PROTONIX) 40 MG tablet Take 40 mg by mouth daily before breakfast.    . potassium chloride SA (K-DUR) 20 MEQ tablet TAKE 1  BY MOUTH ONCE DAILY TAKE  WITH  FUROSEMIDE (Patient taking differently: Take 20 mEq by mouth daily as needed (take with furosemide (fluid retention)). ) 30  tablet 5  . sertraline (ZOLOFT) 100 MG tablet Take 1 tablet (100 mg total) by mouth daily. 90 tablet 3  . topiramate (TOPAMAX) 25 MG tablet Take 1 tablet (25 mg total) by mouth 2 (two) times daily. 60 tablet 11  . traZODone (DESYREL) 100 MG tablet Take 1-3 tablets (100-300 mg total) by mouth at bedtime. 270 tablet 1  . valACYclovir (VALTREX) 1000 MG tablet TAKE 2 TABLETS BY MOUTH TWICE DAILY FOR 5 DAYS AS NEEDED (Patient taking differently: Take 2 g by mouth 2 (two) times daily as needed (fever blisters (use for 5 days whenever needed)). ) 30 tablet 11  . venlafaxine XR (EFFEXOR-XR) 75 MG 24 hr capsule TAKE 3 CAPSULES BY MOUTH ONCE DAILY WITH BREAKFAST 270 capsule 3  . vitamin B-12 (CYANOCOBALAMIN) 1000 MCG tablet Take 6,000 mcg by mouth daily.    . vitamin  E 400 UNIT capsule Take 1,600 Units by mouth daily.    Marland Kitchen XIIDRA 5 % SOLN Place 1 drop into both eyes daily.    Marland Kitchen diltiazem (CARDIZEM CD) 180 MG 24 hr capsule Take 1 capsule (180 mg total) by mouth daily. 90 capsule 3   No current facility-administered medications for this visit.     Allergies:   Plaquenil [hydroxychloroquine]   Social History:  The patient  reports that she has been smoking. She has a 40.00 pack-year smoking history. She has never used smokeless tobacco. She reports that she does not drink alcohol or use drugs.   Family History:   family history includes Alcohol abuse in her father and mother; Bipolar disorder in her mother; Pancreatic cancer in her father; Suicidality in her mother.    Review of Systems: Review of Systems  Constitutional: Negative.   HENT: Negative.   Respiratory: Negative.   Cardiovascular: Negative.   Gastrointestinal: Negative.   Musculoskeletal: Positive for back pain and joint pain.  Neurological: Negative.   Psychiatric/Behavioral: Negative.   All other systems reviewed and are negative.    PHYSICAL EXAM: VS:  BP 130/90 (BP Location: Left Arm, Patient Position: Sitting, Cuff Size:  Normal)   Pulse 80   Ht 5\' 9"  (1.753 m)   Wt 148 lb (67.1 kg)   SpO2 99%   BMI 21.86 kg/m  , BMI Body mass index is 21.86 kg/m. Constitutional:  oriented to person, place, and time. No distress.  HENT:  Head: Grossly normal Eyes:  no discharge. No scleral icterus.  Neck: No JVD, no carotid bruits  Cardiovascular: Regular rate and rhythm, no murmurs appreciated Pulmonary/Chest: Clear to auscultation bilaterally, no wheezes or rails Abdominal: Soft.  no distension.  no tenderness.  Musculoskeletal: Normal range of motion Neurological:  normal muscle tone. Coordination normal. No atrophy Skin: Skin warm and dry Psychiatric: normal affect, pleasant  Recent Labs: 04/17/2019: TSH 1.310 08/09/2019: ALT 19; BUN 11; Creatinine, Ser 0.93; Hemoglobin 12.8; Platelets 188; Potassium 3.5; Sodium 141    Lipid Panel Lab Results  Component Value Date   CHOL 158 04/17/2019   HDL 54 04/17/2019   LDLCALC 80 04/17/2019   TRIG 135 04/17/2019      Wt Readings from Last 3 Encounters:  12/18/19 148 lb (67.1 kg)  12/04/19 153 lb (69.4 kg)  10/04/19 150 lb (68 kg)      ASSESSMENT AND PLAN:  Hypertension New prescription sent in for diltiazem extended release 180 daily Pressure relatively well controlled, she monitors at home  Paroxysmal supraventricular tachycardia (HCC) - Diltiazem 180 mg daily Symptoms well controlled  Hypercholesteremia Lipitor Zetia LDL above goal  SMOKER We have encouraged her to continue to work on weaning her cigarettes and smoking cessation. She will continue to work on this and does not want any assistance with chantix.    Total encounter time more than 25 minutes  Greater than 50% was spent in counseling and coordination of care with the patient   Disposition:   F/U  12 months    Orders Placed This Encounter  Procedures  . EKG 12-Lead     Signed, Esmond Plants, M.D., Ph.D. 12/18/2019  [facility] [place],  [place] [phone]

## 2019-12-18 ENCOUNTER — Other Ambulatory Visit: Payer: Self-pay

## 2019-12-18 ENCOUNTER — Encounter: Payer: Self-pay | Admitting: Cardiovascular Disease

## 2019-12-18 ENCOUNTER — Ambulatory Visit (INDEPENDENT_AMBULATORY_CARE_PROVIDER_SITE_OTHER): Payer: PPO | Admitting: Cardiovascular Disease

## 2019-12-18 VITALS — BP 130/90 | HR 80 | Ht 69.0 in | Wt 148.0 lb

## 2019-12-18 DIAGNOSIS — R002 Palpitations: Secondary | ICD-10-CM

## 2019-12-18 DIAGNOSIS — E782 Mixed hyperlipidemia: Secondary | ICD-10-CM | POA: Diagnosis not present

## 2019-12-18 DIAGNOSIS — I471 Supraventricular tachycardia: Secondary | ICD-10-CM | POA: Diagnosis not present

## 2019-12-18 DIAGNOSIS — Z720 Tobacco use: Secondary | ICD-10-CM

## 2019-12-18 DIAGNOSIS — I10 Essential (primary) hypertension: Secondary | ICD-10-CM | POA: Diagnosis not present

## 2019-12-18 DIAGNOSIS — J449 Chronic obstructive pulmonary disease, unspecified: Secondary | ICD-10-CM | POA: Diagnosis not present

## 2019-12-18 MED ORDER — DILTIAZEM HCL ER COATED BEADS 180 MG PO CP24: 180.0000 mg | ORAL_CAPSULE | Freq: Every day | ORAL | 3 refills | Status: DC

## 2019-12-18 NOTE — Patient Instructions (Signed)

## 2019-12-28 ENCOUNTER — Other Ambulatory Visit: Payer: Self-pay | Admitting: Cardiovascular Disease

## 2020-01-08 DIAGNOSIS — M1610 Unilateral primary osteoarthritis, unspecified hip: Secondary | ICD-10-CM | POA: Diagnosis not present

## 2020-01-08 DIAGNOSIS — M545 Low back pain: Secondary | ICD-10-CM | POA: Diagnosis not present

## 2020-01-08 DIAGNOSIS — M48062 Spinal stenosis, lumbar region with neurogenic claudication: Secondary | ICD-10-CM | POA: Diagnosis not present

## 2020-01-08 DIAGNOSIS — M5416 Radiculopathy, lumbar region: Secondary | ICD-10-CM | POA: Diagnosis not present

## 2020-01-23 DIAGNOSIS — M0579 Rheumatoid arthritis with rheumatoid factor of multiple sites without organ or systems involvement: Secondary | ICD-10-CM | POA: Diagnosis not present

## 2020-01-23 DIAGNOSIS — M8949 Other hypertrophic osteoarthropathy, multiple sites: Secondary | ICD-10-CM | POA: Diagnosis not present

## 2020-01-28 ENCOUNTER — Other Ambulatory Visit: Payer: Self-pay | Admitting: Cardiovascular Disease

## 2020-02-12 ENCOUNTER — Other Ambulatory Visit: Payer: Self-pay | Admitting: Physician Assistant

## 2020-02-12 DIAGNOSIS — R339 Retention of urine, unspecified: Secondary | ICD-10-CM

## 2020-02-12 NOTE — Telephone Encounter (Signed)
Requested medication (s) are due for refill today: Yes  Requested medication (s) are on the active medication list: Yes  Last refill:  11/14/18  Future visit scheduled: Yes  Notes to clinic:  Prescription has expired.    Requested Prescriptions  Pending Prescriptions Disp Refills   furosemide (LASIX) 20 MG tablet [Pharmacy Med Name: Furosemide 20 MG Oral Tablet] 90 tablet 0    Sig: TAKE 1 TABLET BY MOUTH ONCE DAILY AS NEEDED FOR FLUID      Cardiovascular:  Diuretics - Loop Failed - 02/12/2020  5:49 PM      Failed - Last BP in normal range    BP Readings from Last 1 Encounters:  12/18/19 130/90          Passed - K in normal range and within 360 days    Potassium  Date Value Ref Range Status  08/09/2019 3.5 3.5 - 5.1 mmol/L Final          Passed - Ca in normal range and within 360 days    Calcium  Date Value Ref Range Status  08/09/2019 8.9 8.9 - 10.3 mg/dL Final          Passed - Na in normal range and within 360 days    Sodium  Date Value Ref Range Status  08/09/2019 141 135 - 145 mmol/L Final  04/17/2019 147 (H) 134 - 144 mmol/L Final          Passed - Cr in normal range and within 360 days    Creatinine, Ser  Date Value Ref Range Status  08/09/2019 0.93 0.44 - 1.00 mg/dL Final          Passed - Valid encounter within last 6 months    Recent Outpatient Visits           2 months ago GAD (generalized anxiety disorder)   [place], [place], PA-C   8 months ago Encounter for Papanicolaou smear for cervical cancer screening   [facility] [place] M, [place]   9 months ago Vaginal discharge   [facility] [place] M, [place]   10 months ago Initial Medicare annual wellness visit   [place], Luis M. [place], [place]   1 year ago Depression, recurrent [facility])   [place], [place], [place]

## 2020-03-04 ENCOUNTER — Encounter: Payer: Self-pay | Admitting: Physician Assistant

## 2020-03-04 ENCOUNTER — Telehealth (INDEPENDENT_AMBULATORY_CARE_PROVIDER_SITE_OTHER): Payer: PPO | Admitting: Physician Assistant

## 2020-03-04 ENCOUNTER — Ambulatory Visit: Payer: Self-pay

## 2020-03-04 ENCOUNTER — Other Ambulatory Visit: Payer: Self-pay

## 2020-03-04 DIAGNOSIS — R05 Cough: Secondary | ICD-10-CM

## 2020-03-04 DIAGNOSIS — J209 Acute bronchitis, unspecified: Secondary | ICD-10-CM | POA: Diagnosis not present

## 2020-03-04 DIAGNOSIS — R509 Fever, unspecified: Secondary | ICD-10-CM | POA: Diagnosis not present

## 2020-03-04 DIAGNOSIS — J440 Chronic obstructive pulmonary disease with acute lower respiratory infection: Secondary | ICD-10-CM

## 2020-03-04 DIAGNOSIS — R059 Cough, unspecified: Secondary | ICD-10-CM

## 2020-03-04 DIAGNOSIS — J0140 Acute pansinusitis, unspecified: Secondary | ICD-10-CM | POA: Diagnosis not present

## 2020-03-04 MED ORDER — PREDNISONE 10 MG PO TABS: ORAL_TABLET | ORAL | 0 refills | Status: DC

## 2020-03-04 MED ORDER — AMOXICILLIN-POT CLAVULANATE 875-125 MG PO TABS: 1.0000 | ORAL_TABLET | Freq: Two times a day (BID) | ORAL | 0 refills | Status: DC

## 2020-03-04 MED ORDER — BENZONATATE 200 MG PO CAPS: 200.0000 mg | ORAL_CAPSULE | Freq: Three times a day (TID) | ORAL | 0 refills | Status: DC | PRN

## 2020-03-04 NOTE — Progress Notes (Signed)
MyChart Video Visit    Virtual Visit via Video Note   This visit type was conducted due to national recommendations for restrictions regarding the COVID-19 Pandemic (e.g. social distancing) in an effort to limit this patient's exposure and mitigate transmission in our community. This patient is at least at moderate risk for complications without adequate follow up. This format is felt to be most appropriate for this patient at this time. Physical exam was limited by quality of the video and audio technology used for the visit.   I connected with Gabrielle Pineda on 03/06/20 at  9:00 AM EDT by a video enabled telemedicine application and verified that I am speaking with the correct person using two identifiers.  I discussed the limitations of evaluation and management by telemedicine and the availability of in person appointments. The patient expressed understanding and agreed to proceed.  Patient location: Home Provider location: [facility]   Patient: Gabrielle Pineda   DOB: 1955/10/27   64 y.o. Female  MRN: [id number] Visit Date: 03/04/2020  Today's healthcare provider: [name], PA-C   Chief Complaint  Patient presents with  . Fever   Subjective    Fever  This is a new problem. The current episode started yesterday. The problem occurs daily (more in the evening). The problem has been unchanged. Her temperature was unmeasured prior to arrival. Associated symptoms include congestion, coughing, headaches, muscle aches, a sore throat and wheezing. Pertinent negatives include no chest pain, diarrhea, nausea or vomiting. Associated symptoms comments: Patient with COPD. She has tried acetaminophen (Alka-Seltzer cold and plus, Nebulizers treatment, inhalers,and Chloraseptic spray) for the symptoms. The treatment provided no relief.     Patient Active Problem List   Diagnosis Date Noted  . Moderate episode of recurrent major depressive disorder ([place]) 12/04/2019  . Spondylosis of  cervical region without myelopathy or radiculopathy 09/21/2019  . DDD (degenerative disc disease), cervical 09/21/2019  . Cervicalgia 09/21/2019  . Cervical fusion syndrome 09/21/2019  . Chronic pain syndrome 09/21/2019  . Degenerative lumbar spinal stenosis 08/15/2019  . History of adenomatous polyp of colon 11/04/2017  . Cervical radiculopathy 08/02/2017  . Neuropathy 08/02/2017  . Essential hypertension 07/28/2017  . Closed compression fracture of L5 lumbar vertebra 07/07/2017  . Sacral insufficiency fracture with routine healing 07/07/2017  . COPD (chronic obstructive pulmonary disease) ([place]) 04/10/2015  . Family history of malignant neoplasm of pancreas 04/03/2015  . Hypercholesteremia 04/03/2015  . Disorder of iron metabolism 04/03/2015  . Weight loss 04/03/2015  . Delayed onset of urination 04/03/2015  . Acid reflux 10/11/2014  . Closed fracture of distal phalanx of thumb 08/15/2014  . Arthritis, degenerative 01/30/2014  . Arthritis or polyarthritis, rheumatoid ([place]) 01/30/2014  . Shortness of breath 12/22/2013  . SMOKER 09/22/2010  . Paroxysmal supraventricular tachycardia ([place]) 09/22/2010  . CHEST PAIN UNSPECIFIED 09/22/2010  . ELECTROCARDIOGRAM, ABNORMAL 09/22/2010  . B-complex deficiency 09/09/2007  . CN (constipation) 06/26/2007  . Clinical depression 06/26/2007  . Cold sore 06/26/2007  . H/O alcohol abuse 06/26/2007  . Cannot sleep 06/26/2007  . Localized osteoarthrosis, hand 06/26/2007  . Menopausal symptom 06/26/2007   Past Medical History:  Diagnosis Date  . Alcohol abuse   . Anemia   . Anxiety   . Arthritis   . Cervicalgia   . Cirrhosis ([place]) 1994  . COPD (chronic obstructive pulmonary disease) ([place])   . Depression   . Dyspnea   . GERD (gastroesophageal reflux disease)   . Headache   . Heart  murmur    on heard when pt is lying  . Other and unspecified hyperlipidemia   . Tachycardia    d/t questionable anxiety happens every 5- 10 years, sees  [facility]      Medications: Outpatient Medications Prior to Visit  Medication Sig  . ANORO ELLIPTA 62.5-25 MCG/INH AEPB Inhale 1 puff into the lungs daily. (Patient taking differently: Inhale 1 puff into the lungs daily in the afternoon. )  . ARIPiprazole (ABILIFY) 10 MG tablet Take 1 tablet by mouth once daily  . atorvastatin (LIPITOR) 40 MG tablet Take 1 tablet by mouth once daily  . busPIRone (BUSPAR) 30 MG tablet Take 1 tablet (30 mg total) by mouth 3 (three) times daily.  Marland Kitchen diltiazem (CARDIZEM CD) 180 MG 24 hr capsule Take 1 capsule (180 mg total) by mouth daily.  Marland Kitchen ezetimibe (ZETIA) 10 MG tablet Take 1 tablet (10 mg total) by mouth daily.  . fluticasone (FLONASE) 50 MCG/ACT nasal spray Place 2 sprays into both nostrils daily.  . furosemide (LASIX) 20 MG tablet TAKE 1 TABLET BY MOUTH ONCE DAILY AS NEEDED FOR FLUID  . gabapentin (NEURONTIN) 600 MG tablet Take 1 tablet (600 mg total) by mouth 3 (three) times daily.  . Guaifenesin (MUCINEX MAXIMUM STRENGTH) 1200 MG TB12 Take 1,200 mg by mouth daily.  Marland Kitchen ibuprofen (ADVIL) 200 MG tablet Take 3 tablets (600 mg total) by mouth every 8 (eight) hours as needed. For pain  . ipratropium (ATROVENT) 0.02 % nebulizer solution Take 2.5 mLs (0.5 mg total) by nebulization 4 (four) times daily. (Patient taking differently: Take 0.5 mg by nebulization 4 (four) times daily as needed for wheezing or shortness of breath. )  . leflunomide (ARAVA) 10 MG tablet Take 10 mg by mouth at bedtime.   Marland Kitchen LINZESS 145 MCG CAPS capsule Take 145 mcg by mouth daily before breakfast.  . meloxicam (MOBIC) 15 MG tablet Take 1 tablet (15 mg total) by mouth at bedtime.  . Multiple Vitamin (MULTIVITAMIN WITH MINERALS) TABS tablet Take 1 tablet by mouth daily.  . Nutritional Supplements (ESTROVEN ENERGY PO) Take 1 tablet by mouth daily.   Marland Kitchen omeprazole (PRILOSEC) 40 MG capsule Take 1 capsule (40 mg total) by mouth 2 (two) times daily.  Marland Kitchen oxyCODONE (ROXICODONE) 5 MG  immediate release tablet Take 1 tablet (5 mg total) by mouth every 4 (four) hours as needed for severe pain.  . pantoprazole (PROTONIX) 40 MG tablet Take 40 mg by mouth daily before breakfast.  . potassium chloride SA (K-DUR) 20 MEQ tablet TAKE 1  BY MOUTH ONCE DAILY TAKE  WITH  FUROSEMIDE (Patient taking differently: Take 20 mEq by mouth daily as needed (take with furosemide (fluid retention)). )  . sertraline (ZOLOFT) 100 MG tablet Take 1 tablet (100 mg total) by mouth daily.  Marland Kitchen topiramate (TOPAMAX) 25 MG tablet Take 1 tablet (25 mg total) by mouth 2 (two) times daily.  . traZODone (DESYREL) 100 MG tablet Take 1-3 tablets (100-300 mg total) by mouth at bedtime.  . valACYclovir (VALTREX) 1000 MG tablet TAKE 2 TABLETS BY MOUTH TWICE DAILY FOR 5 DAYS AS NEEDED (Patient taking differently: Take 2 g by mouth 2 (two) times daily as needed (fever blisters (use for 5 days whenever needed)). )  . venlafaxine XR (EFFEXOR-XR) 75 MG 24 hr capsule TAKE 3 CAPSULES BY MOUTH ONCE DAILY WITH BREAKFAST  . vitamin B-12 (CYANOCOBALAMIN) 1000 MCG tablet Take 6,000 mcg by mouth daily.  . vitamin E 400 UNIT capsule Take  1,600 Units by mouth daily.  Marland Kitchen XIIDRA 5 % SOLN Place 1 drop into both eyes daily.  Marland Kitchen acyclovir cream (ZOVIRAX) 5 % APPLY TOPICALLY EVERY 4 HOURS (Patient not taking: Reported on 03/04/2020)  . methocarbamol (ROBAXIN-750) 750 MG tablet Take 1 tablet (750 mg total) by mouth 3 (three) times daily as needed for muscle spasms. (Patient not taking: Reported on 03/04/2020)  . nicotine (NICODERM CQ - DOSED IN MG/24 HOURS) 21 mg/24hr patch Place 21 mg onto the skin daily. (Patient not taking: Reported on 03/04/2020)   No facility-administered medications prior to visit.    Review of Systems  Constitutional: Positive for fatigue and fever.  HENT: Positive for congestion, rhinorrhea, sinus pain and sore throat.   Respiratory: Positive for cough, chest tightness, shortness of breath and wheezing.   Cardiovascular:  Negative for chest pain, palpitations and leg swelling.  Gastrointestinal: Negative for diarrhea, nausea and vomiting.  Neurological: Positive for headaches.    Last CBC Lab Results  Component Value Date   WBC 6.7 08/09/2019   HGB 12.8 08/09/2019   HCT 39.6 08/09/2019   MCV 103.4 (H) 08/09/2019   MCH 33.4 08/09/2019   RDW 12.7 08/09/2019   PLT 188 19/75/8832   Last metabolic panel Lab Results  Component Value Date   GLUCOSE 100 (H) 08/09/2019   NA 141 08/09/2019   K 3.5 08/09/2019   CL 115 (H) 08/09/2019   CO2 18 (L) 08/09/2019   BUN 11 08/09/2019   CREATININE 0.93 08/09/2019   GFRNONAA >60 08/09/2019   GFRAA >60 08/09/2019   CALCIUM 8.9 08/09/2019   PROT 6.3 (L) 08/09/2019   ALBUMIN 4.0 08/09/2019   LABGLOB 1.9 04/17/2019   AGRATIO 2.3 (H) 04/17/2019   BILITOT 0.3 08/09/2019   ALKPHOS 101 08/09/2019   AST 24 08/09/2019   ALT 19 08/09/2019   ANIONGAP 8 08/09/2019      Objective    There were no vitals taken for this visit. BP Readings from Last 3 Encounters:  12/18/19 130/90  12/04/19 134/83  10/04/19 (!) 151/71   Wt Readings from Last 3 Encounters:  12/18/19 148 lb (67.1 kg)  12/04/19 153 lb (69.4 kg)  10/04/19 150 lb (68 kg)      Physical Exam     Assessment & Plan     1. Acute non-recurrent pansinusitis Advised patient to isolate and be tested for covid-19. Ordered as below. Possibly sinusitis and bronchitis. Patient high risk for pneumonia due to COPD. Will give Augmentin as below. Push fluids. Continue medications as prescribed. Added prednisone and tessalon perles for bronchitis. Call if worsening. - amoxicillin-clavulanate (AUGMENTIN) 875-125 MG tablet; Take 1 tablet by mouth 2 (two) times daily.  Dispense: 20 tablet; Refill: 0  2. Acute bronchitis with COPD ([place]) See above medical treatment plan. - predniSONE (DELTASONE) 10 MG tablet; Take 6 tablets PO on day 1 and day 2, take 5 tablets PO on day 3 and day 4, take 4 tablets PO on day 5 and  day 6, take 3 tablets PO on day 7 and day 8, take 2 tablets PO on day 9 and day 10, take one tablet PO on day 11 and day 12.  Dispense: 42 tablet; Refill: 0 - benzonatate (TESSALON) 200 MG capsule; Take 1 capsule (200 mg total) by mouth 3 (three) times daily as needed.  Dispense: 30 capsule; Refill: 0  3. Fever, unspecified fever cause See above medical treatment plan. - Novel Coronavirus, NAA ([facility])  4. Cough See above medical  treatment plan. - Novel Coronavirus, NAA ([facility])   No follow-ups on file.     I discussed the assessment and treatment plan with the patient. The patient was provided an opportunity to ask questions and all were answered. The patient agreed with the plan and demonstrated an understanding of the instructions.   The patient was advised to call back or seek an in-person evaluation if the symptoms worsen or if the condition fails to improve as anticipated.  I provided 17 minutes of non-face-to-face time during this encounter.  Reynolds [place], PA-C, have reviewed all documentation for this visit. The documentation on 03/06/20 for the exam, diagnosis, procedures, and orders are all accurate and complete.  [place] [facility] [phone] (phone) [phone] (fax)  [place]

## 2020-03-04 NOTE — Telephone Encounter (Signed)
Pt. Reports she started coughing Friday. Has yellow mucus. Nasal congestion with "blood in my nose." Shortness of breath. "I have COPD." "I feel like I have a fever, but my thermometer is broken." Virtual visit. Made per [facility] agent.  Reason for Disposition  Wheezing is present  Answer Assessment - Initial Assessment Questions 1. ONSET: "When did the cough begin?"      Friday 2. SEVERITY: "How bad is the cough today?"      Severe 3. SPUTUM: "Describe the color of your sputum" (none, dry cough; clear, white, yellow, green)     Yellow 4. HEMOPTYSIS: "Are you coughing up any blood?" If so ask: "How much?" (flecks, streaks, tablespoons, etc.)     Yes - nose 5. DIFFICULTY BREATHING: "Are you having difficulty breathing?" If Yes, ask: "How bad is it?" (e.g., mild, moderate, severe)    - MILD: No SOB at rest, mild SOB with walking, speaks normally in sentences, can lay down, no retractions, pulse < 100.    - MODERATE: SOB at rest, SOB with minimal exertion and prefers to sit, cannot lie down flat, speaks in phrases, mild retractions, audible wheezing, pulse 100-120.    - SEVERE: Very SOB at rest, speaks in single words, struggling to breathe, sitting hunched forward, retractions, pulse > 120      Moderate 6. FEVER: "Do you have a fever?" If Yes, ask: "What is your temperature, how was it measured, and when did it start?"     Yes - thermometer 7. CARDIAC HISTORY: "Do you have any history of heart disease?" (e.g., heart attack, congestive heart failure)      No 8. LUNG HISTORY: "Do you have any history of lung disease?"  (e.g., pulmonary embolus, asthma, emphysema)     COPD 9. PE RISK FACTORS: "Do you have a history of blood clots?" (or: recent major surgery, recent prolonged travel, bedridden)     No 10. OTHER SYMPTOMS: "Do you have any other symptoms?" (e.g., runny nose, wheezing, chest pain)       Runny nose, wheezing 11. PREGNANCY: "Is there any chance you are pregnant?" "When was your last  menstrual period?"       No 12. TRAVEL: "Have you traveled out of the country in the last month?" (e.g., travel history, exposures)       No  Protocols used: [place]

## 2020-03-04 NOTE — Patient Instructions (Signed)
COVID-19 COVID-19 is a respiratory infection that is caused by a virus called severe acute respiratory syndrome coronavirus 2 (SARS-CoV-2). The disease is also known as coronavirus disease or novel coronavirus. In some people, the virus may not cause any symptoms. In others, it may cause a serious infection. The infection can get worse quickly and can lead to complications, such as:  Pneumonia, or infection of the lungs.  Acute respiratory distress syndrome or ARDS. This is a condition in which fluid build-up in the lungs prevents the lungs from filling with air and passing oxygen into the blood.  Acute respiratory failure. This is a condition in which there is not enough oxygen passing from the lungs to the body or when carbon dioxide is not passing from the lungs out of the body.  Sepsis or septic shock. This is a serious bodily reaction to an infection.  Blood clotting problems.  Secondary infections due to bacteria or fungus.  Organ failure. This is when your body's organs stop working. The virus that causes COVID-19 is contagious. This means that it can spread from person to person through droplets from coughs and sneezes (respiratory secretions). What are the causes? This illness is caused by a virus. You may catch the virus by:  Breathing in droplets from an infected person. Droplets can be spread by a person breathing, speaking, singing, coughing, or sneezing.  Touching something, like a table or a doorknob, that was exposed to the virus (contaminated) and then touching your mouth, nose, or eyes. What increases the risk? Risk for infection You are more likely to be infected with this virus if you:  Are within 6 feet (2 meters) of a person with COVID-19.  Provide care for or live with a person who is infected with COVID-19.  Spend time in crowded indoor spaces or live in shared housing. Risk for serious illness You are more likely to become seriously ill from the virus if you:   Are 50 years of age or older. The higher your age, the more you are at risk for serious illness.  Live in a nursing home or long-term care facility.  Have cancer.  Have a long-term (chronic) disease such as: ? Chronic lung disease, including chronic obstructive pulmonary disease or asthma. ? A long-term disease that lowers your body's ability to fight infection (immunocompromised). ? Heart disease, including heart failure, a condition in which the arteries that lead to the heart become narrow or blocked (coronary artery disease), a disease which makes the heart muscle thick, weak, or stiff (cardiomyopathy). ? Diabetes. ? Chronic kidney disease. ? Sickle cell disease, a condition in which red blood cells have an abnormal "sickle" shape. ? Liver disease.  Are obese. What are the signs or symptoms? Symptoms of this condition can range from mild to severe. Symptoms may appear any time from 2 to 14 days after being exposed to the virus. They include:  A fever or chills.  A cough.  Difficulty breathing.  Headaches, body aches, or muscle aches.  Runny or stuffy (congested) nose.  A sore throat.  New loss of taste or smell. Some people may also have stomach problems, such as nausea, vomiting, or diarrhea. Other people may not have any symptoms of COVID-19. How is this diagnosed? This condition may be diagnosed based on:  Your signs and symptoms, especially if: ? You live in an area with a COVID-19 outbreak. ? You recently traveled to or from an area where the virus is common. ? You   provide care for or live with a person who was diagnosed with COVID-19. ? You were exposed to a person who was diagnosed with COVID-19.  A physical exam.  Lab tests, which may include: ? Taking a sample of fluid from the back of your nose and throat (nasopharyngeal fluid), your nose, or your throat using a swab. ? A sample of mucus from your lungs (sputum). ? Blood tests.  Imaging tests, which  may include, X-rays, CT scan, or ultrasound. How is this treated? At present, there is no medicine to treat COVID-19. Medicines that treat other diseases are being used on a trial basis to see if they are effective against COVID-19. Your health care provider will talk with you about ways to treat your symptoms. For most people, the infection is mild and can be managed at home with rest, fluids, and over-the-counter medicines. Treatment for a serious infection usually takes places in a hospital intensive care unit (ICU). It may include one or more of the following treatments. These treatments are given until your symptoms improve.  Receiving fluids and medicines through an IV.  Supplemental oxygen. Extra oxygen is given through a tube in the nose, a face mask, or a hood.  Positioning you to lie on your stomach (prone position). This makes it easier for oxygen to get into the lungs.  Continuous positive airway pressure (CPAP) or bi-level positive airway pressure (BPAP) machine. This treatment uses mild air pressure to keep the airways open. A tube that is connected to a motor delivers oxygen to the body.  Ventilator. This treatment moves air into and out of the lungs by using a tube that is placed in your windpipe.  Tracheostomy. This is a procedure to create a hole in the neck so that a breathing tube can be inserted.  Extracorporeal membrane oxygenation (ECMO). This procedure gives the lungs a chance to recover by taking over the functions of the heart and lungs. It supplies oxygen to the body and removes carbon dioxide. Follow these instructions at home: Lifestyle  If you are sick, stay home except to get medical care. Your health care provider will tell you how long to stay home. Call your health care provider before you go for medical care.  Rest at home as told by your health care provider.  Do not use any products that contain nicotine or tobacco, such as cigarettes, e-cigarettes, and  chewing tobacco. If you need help quitting, ask your health care provider.  Return to your normal activities as told by your health care provider. Ask your health care provider what activities are safe for you. General instructions  Take over-the-counter and prescription medicines only as told by your health care provider.  Drink enough fluid to keep your urine pale yellow.  Keep all follow-up visits as told by your health care provider. This is important. How is this prevented?  There is no vaccine to help prevent COVID-19 infection. However, there are steps you can take to protect yourself and others from this virus. To protect yourself:   Do not travel to areas where COVID-19 is a risk. The areas where COVID-19 is reported change often. To identify high-risk areas and travel restrictions, check the CDC travel website: wwwnc.cdc.gov/travel/notices  If you live in, or must travel to, an area where COVID-19 is a risk, take precautions to avoid infection. ? Stay away from people who are sick. ? Wash your hands often with soap and water for 20 seconds. If soap and water   are not available, use an alcohol-based hand sanitizer. ? Avoid touching your mouth, face, eyes, or nose. ? Avoid going out in public, follow guidance from your state and local health authorities. ? If you must go out in public, wear a cloth face covering or face mask. Make sure your mask covers your nose and mouth. ? Avoid crowded indoor spaces. Stay at least 6 feet (2 meters) away from others. ? Disinfect objects and surfaces that are frequently touched every day. This may include:  Counters and tables.  Doorknobs and light switches.  Sinks and faucets.  Electronics, such as phones, remote controls, keyboards, computers, and tablets. To protect others: If you have symptoms of COVID-19, take steps to prevent the virus from spreading to others.  If you think you have a COVID-19 infection, contact your health care  provider right away. Tell your health care team that you think you may have a COVID-19 infection.  Stay home. Leave your house only to seek medical care. Do not use public transport.  Do not travel while you are sick.  Wash your hands often with soap and water for 20 seconds. If soap and water are not available, use alcohol-based hand sanitizer.  Stay away from other members of your household. Let healthy household members care for children and pets, if possible. If you have to care for children or pets, wash your hands often and wear a mask. If possible, stay in your own room, separate from others. Use a different bathroom.  Make sure that all people in your household wash their hands well and often.  Cough or sneeze into a tissue or your sleeve or elbow. Do not cough or sneeze into your hand or into the air.  Wear a cloth face covering or face mask. Make sure your mask covers your nose and mouth. Where to find more information  Centers for Disease Control and Prevention: www.cdc.gov/coronavirus/2019-ncov/index.html  World Health Organization: www.who.int/health-topics/coronavirus Contact a health care provider if:  You live in or have traveled to an area where COVID-19 is a risk and you have symptoms of the infection.  You have had contact with someone who has COVID-19 and you have symptoms of the infection. Get help right away if:  You have trouble breathing.  You have pain or pressure in your chest.  You have confusion.  You have bluish lips and fingernails.  You have difficulty waking from sleep.  You have symptoms that get worse. These symptoms may represent a serious problem that is an emergency. Do not wait to see if the symptoms will go away. Get medical help right away. Call your local emergency services (911 in the [place] Do not drive yourself to the hospital. Let the emergency medical personnel know if you think you have COVID-19. Summary  COVID-19 is a  respiratory infection that is caused by a virus. It is also known as coronavirus disease or novel coronavirus. It can cause serious infections, such as pneumonia, acute respiratory distress syndrome, acute respiratory failure, or sepsis.  The virus that causes COVID-19 is contagious. This means that it can spread from person to person through droplets from breathing, speaking, singing, coughing, or sneezing.  You are more likely to develop a serious illness if you are 50 years of age or older, have a weak immune system, live in a nursing home, or have chronic disease.  There is no medicine to treat COVID-19. Your health care provider will talk with you about ways to treat your symptoms.    Take steps to protect yourself and others from infection. Wash your hands often and disinfect objects and surfaces that are frequently touched every day. Stay away from people who are sick and wear a mask if you are sick. This information is not intended to replace advice given to you by your health care provider. Make sure you discuss any questions you have with your health care provider. Document Revised: 06/02/2019 Document Reviewed: 09/08/2018 [employer] Patient Education  2020 [employer]  

## 2020-03-06 ENCOUNTER — Telehealth: Payer: Self-pay

## 2020-03-06 NOTE — Telephone Encounter (Signed)
Copied from [place] [id number]. Topic: General - Inquiry >> Mar 06, 2020 12:03 PM Mathis Bud wrote: Reason for CRM: Patient called to let PCP know that her covid test was negative Call back [phone]

## 2020-03-07 ENCOUNTER — Other Ambulatory Visit: Payer: PPO

## 2020-03-08 ENCOUNTER — Telehealth (INDEPENDENT_AMBULATORY_CARE_PROVIDER_SITE_OTHER): Payer: PPO | Admitting: Physician Assistant

## 2020-03-08 ENCOUNTER — Encounter: Payer: Self-pay | Admitting: Physician Assistant

## 2020-03-08 DIAGNOSIS — J0140 Acute pansinusitis, unspecified: Secondary | ICD-10-CM

## 2020-03-08 MED ORDER — DOXYCYCLINE HYCLATE 100 MG PO TABS: 100.0000 mg | ORAL_TABLET | Freq: Two times a day (BID) | ORAL | 0 refills | Status: DC

## 2020-03-08 NOTE — Progress Notes (Signed)
MyChart Video Visit    Virtual Visit via Video Note   This visit type was conducted due to national recommendations for restrictions regarding the COVID-19 Pandemic (e.g. social distancing) in an effort to limit this patient's exposure and mitigate transmission in our community. This patient is at least at moderate risk for complications without adequate follow up. This format is felt to be most appropriate for this patient at this time. Physical exam was limited by quality of the video and audio technology used for the visit.   I connected with Gabrielle Pineda on 03/12/20 at  1:20 PM EDT by a video enabled telemedicine application and verified that I am speaking with the correct person using two identifiers.  I discussed the limitations of evaluation and management by telemedicine and the availability of in person appointments. The patient expressed understanding and agreed to proceed.  Patient location: Home Provider location: [facility]   Patient: Gabrielle Pineda   DOB: Feb 28, 1956   64 y.o. Female  MRN: [id number] Visit Date: 03/08/2020  Today's healthcare provider: [name], PA-C   Chief Complaint  Patient presents with  . Cough   Subjective    HPI  Follow up for Bronchitis:  The patient was last seen for this 4 days ago. Changes made at last visit include prescribing prednisone, Augmentin and  benzonatate (TESSALON) 200 MG capsule.  She reports good compliance with treatment. She feels that condition is Unchanged. Patient states she doesn't feel any better since taking Augmentin, prednisone. She still has a productive cough, fever, headaches, runny nose, sore throat and now hoarseness.  She is not having side effects.  Covid testing was negative.  -----------------------------------------------------------------------------------------   Patient Active Problem List   Diagnosis Date Noted  . Moderate episode of recurrent major depressive disorder ([place])  12/04/2019  . Spondylosis of cervical region without myelopathy or radiculopathy 09/21/2019  . DDD (degenerative disc disease), cervical 09/21/2019  . Cervicalgia 09/21/2019  . Cervical fusion syndrome 09/21/2019  . Chronic pain syndrome 09/21/2019  . Degenerative lumbar spinal stenosis 08/15/2019  . History of adenomatous polyp of colon 11/04/2017  . Cervical radiculopathy 08/02/2017  . Neuropathy 08/02/2017  . Essential hypertension 07/28/2017  . Closed compression fracture of L5 lumbar vertebra 07/07/2017  . Sacral insufficiency fracture with routine healing 07/07/2017  . COPD (chronic obstructive pulmonary disease) ([place]) 04/10/2015  . Family history of malignant neoplasm of pancreas 04/03/2015  . Hypercholesteremia 04/03/2015  . Disorder of iron metabolism 04/03/2015  . Weight loss 04/03/2015  . Delayed onset of urination 04/03/2015  . Acid reflux 10/11/2014  . Closed fracture of distal phalanx of thumb 08/15/2014  . Arthritis, degenerative 01/30/2014  . Arthritis or polyarthritis, rheumatoid ([place]) 01/30/2014  . Shortness of breath 12/22/2013  . SMOKER 09/22/2010  . Paroxysmal supraventricular tachycardia ([place]) 09/22/2010  . CHEST PAIN UNSPECIFIED 09/22/2010  . ELECTROCARDIOGRAM, ABNORMAL 09/22/2010  . B-complex deficiency 09/09/2007  . CN (constipation) 06/26/2007  . Clinical depression 06/26/2007  . Cold sore 06/26/2007  . H/O alcohol abuse 06/26/2007  . Cannot sleep 06/26/2007  . Localized osteoarthrosis, hand 06/26/2007  . Menopausal symptom 06/26/2007   Past Medical History:  Diagnosis Date  . Alcohol abuse   . Anemia   . Anxiety   . Arthritis   . Cervicalgia   . Cirrhosis ([place]) 1994  . COPD (chronic obstructive pulmonary disease) ([place])   . Depression   . Dyspnea   . GERD (gastroesophageal reflux disease)   . Headache   .  Heart murmur    on heard when pt is lying  . Other and unspecified hyperlipidemia   . Tachycardia    d/t questionable anxiety happens  every 5- 10 years, sees [facility]      Medications: Outpatient Medications Prior to Visit  Medication Sig  . amoxicillin-clavulanate (AUGMENTIN) 875-125 MG tablet Take 1 tablet by mouth 2 (two) times daily.  Jearl Klinefelter ELLIPTA 62.5-25 MCG/INH AEPB Inhale 1 puff into the lungs daily. (Patient taking differently: Inhale 1 puff into the lungs daily in the afternoon. )  . ARIPiprazole (ABILIFY) 10 MG tablet Take 1 tablet by mouth once daily  . atorvastatin (LIPITOR) 40 MG tablet Take 1 tablet by mouth once daily  . benzonatate (TESSALON) 200 MG capsule Take 1 capsule (200 mg total) by mouth 3 (three) times daily as needed.  . busPIRone (BUSPAR) 30 MG tablet Take 1 tablet (30 mg total) by mouth 3 (three) times daily.  Marland Kitchen diltiazem (CARDIZEM CD) 180 MG 24 hr capsule Take 1 capsule (180 mg total) by mouth daily.  Marland Kitchen ezetimibe (ZETIA) 10 MG tablet Take 1 tablet (10 mg total) by mouth daily.  . fluticasone (FLONASE) 50 MCG/ACT nasal spray Place 2 sprays into both nostrils daily.  . furosemide (LASIX) 20 MG tablet TAKE 1 TABLET BY MOUTH ONCE DAILY AS NEEDED FOR FLUID  . gabapentin (NEURONTIN) 600 MG tablet Take 1 tablet (600 mg total) by mouth 3 (three) times daily.  . Guaifenesin (MUCINEX MAXIMUM STRENGTH) 1200 MG TB12 Take 1,200 mg by mouth daily.  Marland Kitchen ibuprofen (ADVIL) 200 MG tablet Take 3 tablets (600 mg total) by mouth every 8 (eight) hours as needed. For pain  . ipratropium (ATROVENT) 0.02 % nebulizer solution Take 2.5 mLs (0.5 mg total) by nebulization 4 (four) times daily. (Patient taking differently: Take 0.5 mg by nebulization 4 (four) times daily as needed for wheezing or shortness of breath. )  . leflunomide (ARAVA) 10 MG tablet Take 10 mg by mouth at bedtime.   Marland Kitchen LINZESS 145 MCG CAPS capsule Take 145 mcg by mouth daily before breakfast.  . meloxicam (MOBIC) 15 MG tablet Take 1 tablet (15 mg total) by mouth at bedtime.  . Multiple Vitamin (MULTIVITAMIN WITH MINERALS) TABS tablet Take 1  tablet by mouth daily.  . Nutritional Supplements (ESTROVEN ENERGY PO) Take 1 tablet by mouth daily.   Marland Kitchen omeprazole (PRILOSEC) 40 MG capsule Take 1 capsule (40 mg total) by mouth 2 (two) times daily.  Marland Kitchen oxyCODONE (ROXICODONE) 5 MG immediate release tablet Take 1 tablet (5 mg total) by mouth every 4 (four) hours as needed for severe pain.  . pantoprazole (PROTONIX) 40 MG tablet Take 40 mg by mouth daily before breakfast.  . potassium chloride SA (K-DUR) 20 MEQ tablet TAKE 1  BY MOUTH ONCE DAILY TAKE  WITH  FUROSEMIDE (Patient taking differently: Take 20 mEq by mouth daily as needed (take with furosemide (fluid retention)). )  . predniSONE (DELTASONE) 10 MG tablet Take 6 tablets PO on day 1 and day 2, take 5 tablets PO on day 3 and day 4, take 4 tablets PO on day 5 and day 6, take 3 tablets PO on day 7 and day 8, take 2 tablets PO on day 9 and day 10, take one tablet PO on day 11 and day 12.  . sertraline (ZOLOFT) 100 MG tablet Take 1 tablet (100 mg total) by mouth daily.  Marland Kitchen topiramate (TOPAMAX) 25 MG tablet Take 1 tablet (25 mg total) by  mouth 2 (two) times daily.  . traZODone (DESYREL) 100 MG tablet Take 1-3 tablets (100-300 mg total) by mouth at bedtime.  . valACYclovir (VALTREX) 1000 MG tablet TAKE 2 TABLETS BY MOUTH TWICE DAILY FOR 5 DAYS AS NEEDED (Patient taking differently: Take 2 g by mouth 2 (two) times daily as needed (fever blisters (use for 5 days whenever needed)). )  . venlafaxine XR (EFFEXOR-XR) 75 MG 24 hr capsule TAKE 3 CAPSULES BY MOUTH ONCE DAILY WITH BREAKFAST  . vitamin B-12 (CYANOCOBALAMIN) 1000 MCG tablet Take 6,000 mcg by mouth daily.  . vitamin E 400 UNIT capsule Take 1,600 Units by mouth daily.  Marland Kitchen XIIDRA 5 % SOLN Place 1 drop into both eyes daily.  . methocarbamol (ROBAXIN-750) 750 MG tablet Take 1 tablet (750 mg total) by mouth 3 (three) times daily as needed for muscle spasms. (Patient not taking: Reported on 03/04/2020)  . nicotine (NICODERM CQ - DOSED IN MG/24 HOURS) 21  mg/24hr patch Place 21 mg onto the skin daily. (Patient not taking: Reported on 03/04/2020)  . [DISCONTINUED] acyclovir cream (ZOVIRAX) 5 % APPLY TOPICALLY EVERY 4 HOURS (Patient not taking: Reported on 03/04/2020)   No facility-administered medications prior to visit.    Review of Systems  Constitutional: Positive for fever. Negative for appetite change, chills and fatigue.  HENT: Positive for rhinorrhea and sore throat.   Respiratory: Positive for cough and shortness of breath. Negative for chest tightness.   Cardiovascular: Negative for chest pain and palpitations.  Gastrointestinal: Negative for abdominal pain, nausea and vomiting.  Neurological: Positive for headaches. Negative for dizziness and weakness.    Last CBC Lab Results  Component Value Date   WBC 6.7 08/09/2019   HGB 12.8 08/09/2019   HCT 39.6 08/09/2019   MCV 103.4 (H) 08/09/2019   MCH 33.4 08/09/2019   RDW 12.7 08/09/2019   PLT 188 93/81/8299   Last metabolic panel Lab Results  Component Value Date   GLUCOSE 100 (H) 08/09/2019   NA 141 08/09/2019   K 3.5 08/09/2019   CL 115 (H) 08/09/2019   CO2 18 (L) 08/09/2019   BUN 11 08/09/2019   CREATININE 0.93 08/09/2019   GFRNONAA >60 08/09/2019   GFRAA >60 08/09/2019   CALCIUM 8.9 08/09/2019   PROT 6.3 (L) 08/09/2019   ALBUMIN 4.0 08/09/2019   LABGLOB 1.9 04/17/2019   AGRATIO 2.3 (H) 04/17/2019   BILITOT 0.3 08/09/2019   ALKPHOS 101 08/09/2019   AST 24 08/09/2019   ALT 19 08/09/2019   ANIONGAP 8 08/09/2019      Objective    There were no vitals taken for this visit. BP Readings from Last 3 Encounters:  12/18/19 130/90  12/04/19 134/83  10/04/19 (!) 151/71   Wt Readings from Last 3 Encounters:  12/18/19 148 lb (67.1 kg)  12/04/19 153 lb (69.4 kg)  10/04/19 150 lb (68 kg)      Physical Exam Vitals reviewed.  Constitutional:      General: She is not in acute distress.    Appearance: Normal appearance. She is well-developed. She is ill-appearing.   HENT:     Head: Normocephalic and atraumatic.     Comments: Hoarse voice Pulmonary:     Effort: Pulmonary effort is normal. No respiratory distress.  Musculoskeletal:     Cervical back: Normal range of motion and neck supple.  Neurological:     Mental Status: She is alert.  Psychiatric:        Mood and Affect: Mood normal.  Behavior: Behavior normal.        Thought Content: Thought content normal.        Judgment: Judgment normal.        Assessment & Plan     Problem List Items Addressed This Visit      Respiratory   Acute non-recurrent pansinusitis - Primary    Failed Augmentin, but had only been taking for 3-4 days Change therapy to Doxycycline Continue prednisone, on a 12 day taper Continue Tessaloon perles for cough Salt water gargles for sore throat Tylenol and IBU alternating every 3-4 hours as needed for body aches and fever Push fluids Rest Call if worsening      Relevant Medications   doxycycline (VIBRA-TABS) 100 MG tablet      No follow-ups on file.     I discussed the assessment and treatment plan with the patient. The patient was provided an opportunity to ask questions and all were answered. The patient agreed with the plan and demonstrated an understanding of the instructions.   The patient was advised to call back or seek an in-person evaluation if the symptoms worsen or if the condition fails to improve as anticipated.  I provided 12 minutes of non-face-to-face time during this encounter.  Reynolds Bowl, PA-C, have reviewed all documentation for this visit. The documentation on 03/12/20 for the exam, diagnosis, procedures, and orders are all accurate and complete.  [place] [facility] [phone] (phone) [phone] (fax)  [place]

## 2020-03-12 ENCOUNTER — Encounter: Payer: Self-pay | Admitting: Physician Assistant

## 2020-03-12 DIAGNOSIS — J0140 Acute pansinusitis, unspecified: Secondary | ICD-10-CM | POA: Insufficient documentation

## 2020-03-12 NOTE — Assessment & Plan Note (Signed)
Failed Augmentin, but had only been taking for 3-4 days Change therapy to Doxycycline Continue prednisone, on a 12 day taper Continue Tessaloon perles for cough Salt water gargles for sore throat Tylenol and IBU alternating every 3-4 hours as needed for body aches and fever Push fluids Rest Call if worsening

## 2020-03-13 ENCOUNTER — Other Ambulatory Visit: Payer: Self-pay | Admitting: Student in an Organized Health Care Education/Training Program

## 2020-03-13 DIAGNOSIS — G629 Polyneuropathy, unspecified: Secondary | ICD-10-CM

## 2020-03-14 ENCOUNTER — Other Ambulatory Visit: Payer: Self-pay | Admitting: Physician Assistant

## 2020-03-14 DIAGNOSIS — J418 Mixed simple and mucopurulent chronic bronchitis: Secondary | ICD-10-CM

## 2020-03-15 ENCOUNTER — Telehealth: Payer: Self-pay | Admitting: Student in an Organized Health Care Education/Training Program

## 2020-03-15 NOTE — Telephone Encounter (Signed)
I added patient to Monday virtual schedule med mgmt Dr. Holley Raring She will need Nurse Chart Call please.

## 2020-03-18 ENCOUNTER — Other Ambulatory Visit: Payer: Self-pay

## 2020-03-18 ENCOUNTER — Ambulatory Visit
Payer: PPO | Attending: Student in an Organized Health Care Education/Training Program | Admitting: Student in an Organized Health Care Education/Training Program

## 2020-03-18 ENCOUNTER — Encounter: Payer: Self-pay | Admitting: Student in an Organized Health Care Education/Training Program

## 2020-03-18 DIAGNOSIS — G894 Chronic pain syndrome: Secondary | ICD-10-CM

## 2020-03-18 DIAGNOSIS — M5030 Other cervical disc degeneration, unspecified cervical region: Secondary | ICD-10-CM | POA: Diagnosis not present

## 2020-03-18 DIAGNOSIS — G629 Polyneuropathy, unspecified: Secondary | ICD-10-CM

## 2020-03-18 DIAGNOSIS — M5416 Radiculopathy, lumbar region: Secondary | ICD-10-CM | POA: Diagnosis not present

## 2020-03-18 DIAGNOSIS — M47812 Spondylosis without myelopathy or radiculopathy, cervical region: Secondary | ICD-10-CM

## 2020-03-18 DIAGNOSIS — Q761 Klippel-Feil syndrome: Secondary | ICD-10-CM

## 2020-03-18 MED ORDER — GABAPENTIN 600 MG PO TABS: 600.0000 mg | ORAL_TABLET | Freq: Three times a day (TID) | ORAL | 2 refills | Status: DC

## 2020-03-18 NOTE — Progress Notes (Signed)
Patient: Gabrielle Pineda  Service Category: E/M  Provider: [name], MD  DOB: 02-Jan-1956  DOS: 03/18/2020  Location: Office  MRN: [id number]  Setting: Ambulatory outpatient  Referring Provider: Florian Buff*  Type: Established Patient  Specialty: Interventional Pain Management  PCP: [name], PA-C  Location: Home  Delivery: TeleHealth     Virtual Encounter - Pain Management PROVIDER NOTE: Information contained herein reflects review and annotations entered in association with encounter. Interpretation of such information and data should be left to medically-trained personnel. Information provided to patient can be located elsewhere in the medical record under "Patient Instructions". Document created using STT-dictation technology, any transcriptional errors that may result from process are unintentional.    Contact & Pharmacy Preferred: [place]: [phone] (home) Mobile: [phone] (mobile) E-mail: [email or web address]  [facility] [address] ([address]), [place] [employer] [place]) [place] [address] Phone: [phone] Fax: [phone]   Pre-screening  Gabrielle Pineda offered "in-person" vs "virtual" encounter. She indicated preferring virtual for this encounter.   Reason COVID-19*  Social distancing based on CDC and AMA recommendations.   I contacted KIMBLERY DIOP on 03/18/2020 via video conference.      I clearly identified myself as 21/09/2019, MD. I verified that I was speaking with the correct person using two identifiers (Name: Gabrielle Pineda, and date of birth: [birth year]).  Consent I sought verbal advanced consent from 08/22/1956 for virtual visit interactions. I informed Gabrielle Pineda of possible security and privacy concerns, risks, and limitations associated with providing "not-in-person" medical evaluation and management services. I also informed Gabrielle Pineda of the availability  of "in-person" appointments. Finally, I informed her that there would be a charge for the virtual visit and that she could be  personally, fully or partially, financially responsible for it. Gabrielle Pineda expressed understanding and agreed to proceed.   Historic Elements   Gabrielle Pineda is a 64 y.o. year old, female patient evaluated today after her last contact with our practice on 03/15/2020. Gabrielle Pineda  has a past medical history of Alcohol abuse, Anemia, Anxiety, Arthritis, Cervicalgia, Cirrhosis ([place]) (1994), COPD (chronic obstructive pulmonary disease) ([place]), Depression, Dyspnea, GERD (gastroesophageal reflux disease), Headache, Heart murmur, Other and unspecified hyperlipidemia, and Tachycardia. She also  has a past surgical history that includes neck disc surgery; Breast enhancement surgery (1981); Esophagogastroduodenoscopy; Tonsillectomy and adenoidectomy (1973 ); Carpal tunnel release (11/11/2011); Carpal tunnel release (2013); Rotator cuff repair (06/16/2016); Rotator cuff repair; Colonoscopy with propofol (N/A, 11/01/2017); Augmentation mammaplasty (Bilateral, 1982); Anterior cervical decomp/discectomy fusion (2012, 2015, 2018); and Lumbar laminectomy/decompression microdiscectomy (N/A, 08/15/2019). Gabrielle Pineda has a current medication list which includes the following prescription(s): anoro ellipta, aripiprazole, atorvastatin, buspirone, diltiazem, doxycycline, ezetimibe, fluticasone, furosemide, gabapentin, guaifenesin, ibuprofen, ipratropium, leflunomide, linzess, meloxicam, multivitamin with minerals, misc natural products, omeprazole, oxycodone, potassium chloride sa, sertraline, topiramate, trazodone, valacyclovir, venlafaxine xr, vitamin b-12, vitamin e, xiidra, benzonatate, pantoprazole, prednisone, and [DISCONTINUED] sertraline. She  reports that she has been smoking. She has a 40.00 pack-year smoking history. She has never used smokeless tobacco. She reports that she does not  drink alcohol and does not use drugs. Gabrielle Pineda is allergic to plaquenil [hydroxychloroquine].   HPI  Today, she is being contacted for medication management.   No significant change in medical history other than the patient has been dealing with laryngitis for the last 3 weeks which she states is getting better.  Continues Topamax and gabapentin as I  prescribed.  Is requesting refill on gabapentin.  Current dose of 600 mg 3 times a day.  Provides analgesic benefit and improvement in range of motion especially in regards to her neck.  No side effects of sedation, weight gain, nausea, swelling.  We will refill as below.  Laboratory Chemistry Profile   Renal Lab Results  Component Value Date   BUN 11 08/09/2019   CREATININE 0.93 08/09/2019   BCR 16 04/17/2019   GFRAA >60 08/09/2019   GFRNONAA >60 08/09/2019     Hepatic Lab Results  Component Value Date   AST 24 08/09/2019   ALT 19 08/09/2019   ALBUMIN 4.0 08/09/2019   ALKPHOS 101 08/09/2019   LIPASE 63 03/23/2018     Electrolytes Lab Results  Component Value Date   NA 141 08/09/2019   K 3.5 08/09/2019   CL 115 (H) 08/09/2019   CALCIUM 8.9 08/09/2019     Bone No results found for: VD25OH, VD125OH2TOT, YW7371GG2, IR4854OE7, 25OHVITD1, 25OHVITD2, 25OHVITD3, TESTOFREE, TESTOSTERONE   Inflammation (CRP: Acute Phase) (ESR: Chronic Phase) No results found for: CRP, ESRSEDRATE, LATICACIDVEN     Note: Above Lab results reviewed.    Assessment  The primary encounter diagnosis was DDD (degenerative disc disease), cervical. Diagnoses of Neuropathy, Spondylosis of cervical region without myelopathy or radiculopathy, Cervical fusion syndrome, Lumbar radiculopathy (R>L), and Chronic pain syndrome were also pertinent to this visit.  Plan of Care  Gabrielle Pineda has a current medication list which includes the following long-term medication(s): aripiprazole, atorvastatin, diltiazem, ezetimibe, fluticasone, furosemide,  gabapentin, ipratropium, linzess, omeprazole, potassium chloride sa, sertraline, topiramate, trazodone, venlafaxine xr, and [DISCONTINUED] sertraline.  Pharmacotherapy (Medications Ordered): Meds ordered this encounter  Medications  . gabapentin (NEURONTIN) 600 MG tablet    Sig: Take 1 tablet (600 mg total) by mouth 3 (three) times daily.    Dispense:  270 tablet    Refill:  2   Follow-up plan:   Return if symptoms worsen or fail to improve.   Recent Visits No visits were found meeting these conditions. Showing recent visits within past 90 days and meeting all other requirements Today's Visits Date Type Provider Dept  03/18/20 Telemedicine [name], MD [facility]  Showing today's visits and meeting all other requirements Future Appointments No visits were found meeting these conditions. Showing future appointments within next 90 days and meeting all other requirements  I discussed the assessment and treatment plan with the patient. The patient was provided an opportunity to ask questions and all were answered. The patient agreed with the plan and demonstrated an understanding of the instructions.  Patient advised to call back or seek an in-person evaluation if the symptoms or condition worsens.  Duration of encounter: 30mnutes.  Note by: BGillis Santa MD Date: 03/18/2020; Time: 10:05 AM

## 2020-03-22 ENCOUNTER — Encounter: Payer: Self-pay | Admitting: Physician Assistant

## 2020-03-22 DIAGNOSIS — J209 Acute bronchitis, unspecified: Secondary | ICD-10-CM

## 2020-03-22 MED ORDER — PREDNISONE 10 MG PO TABS: ORAL_TABLET | ORAL | 0 refills | Status: DC

## 2020-03-22 NOTE — Addendum Note (Signed)
Addended by: [name] on: 03/22/2020 01:30 PM   Modules accepted: Orders

## 2020-03-26 ENCOUNTER — Encounter: Payer: Self-pay | Admitting: Physician Assistant

## 2020-03-26 DIAGNOSIS — J040 Acute laryngitis: Secondary | ICD-10-CM

## 2020-03-29 ENCOUNTER — Encounter: Payer: Self-pay | Admitting: Physician Assistant

## 2020-04-03 ENCOUNTER — Other Ambulatory Visit: Payer: Self-pay | Admitting: Physician Assistant

## 2020-04-03 DIAGNOSIS — B001 Herpesviral vesicular dermatitis: Secondary | ICD-10-CM

## 2020-04-03 DIAGNOSIS — F321 Major depressive disorder, single episode, moderate: Secondary | ICD-10-CM

## 2020-04-03 DIAGNOSIS — J209 Acute bronchitis, unspecified: Secondary | ICD-10-CM | POA: Diagnosis not present

## 2020-04-03 DIAGNOSIS — J019 Acute sinusitis, unspecified: Secondary | ICD-10-CM | POA: Diagnosis not present

## 2020-04-03 DIAGNOSIS — J040 Acute laryngitis: Secondary | ICD-10-CM | POA: Diagnosis not present

## 2020-04-03 DIAGNOSIS — K219 Gastro-esophageal reflux disease without esophagitis: Secondary | ICD-10-CM | POA: Diagnosis not present

## 2020-04-03 NOTE — Telephone Encounter (Signed)
Requested medications are due for refill today?  Yes - This refill cannot be delegated.    Requested medications are on active medication list?  Yes   Last Refill:  10/09/2019  # 90 with one refill   Future visit scheduled?  No   Notes to Clinic:  This refill cannot be delegated.

## 2020-04-03 NOTE — Telephone Encounter (Signed)
Requested Prescriptions  Pending Prescriptions Disp Refills   valACYclovir (VALTREX) 1000 MG tablet [Pharmacy Med Name: valACYclovir HCl 1 GM Oral Tablet] 30 tablet 0    Sig: TAKE 2 TABLETS BY MOUTH TWICE DAILY AS NEEDED FOR 5 DAYS     Antimicrobials:  Antiviral Agents - Anti-Herpetic Passed - 04/03/2020  5:30 AM      Passed - Valid encounter within last 12 months    Recent Outpatient Visits          3 weeks ago Acute non-recurrent pansinusitis   [place], [facility]   1 month ago Acute non-recurrent pansinusitis   [place], [place]   4 months ago GAD (generalized anxiety disorder)   [place], [place]   10 months ago Encounter for Papanicolaou smear for cervical cancer screening   [facility] [place], [place]   11 months ago Vaginal discharge   [facility] [place], [place], [place]

## 2020-04-14 ENCOUNTER — Other Ambulatory Visit: Payer: Self-pay | Admitting: Physician Assistant

## 2020-04-14 DIAGNOSIS — J418 Mixed simple and mucopurulent chronic bronchitis: Secondary | ICD-10-CM

## 2020-04-18 NOTE — Progress Notes (Signed)
Annual Wellness Visit     Patient: Gabrielle Pineda, Female    DOB: [birth year], 64 y.o.   MRN: [id number] Visit Date: 04/19/2020  Today's Provider: [name], PA-C   Chief Complaint  Patient presents with  . Medicare Wellness   Subjective    Gabrielle Pineda is a 64 y.o. female who presents today for her Annual Wellness Visit. She reports consuming a general diet. Home exercise routine includes walks 20 minutes 3-4 times a week. She generally feels well. She reports sleeping fairly well. She does have additional problems to discuss today.   HPI Difficulty urinating: reports that she has been having this problem for the past 3-4 months. She feels like she needs to push to be able to urinate. No dysuria. No other symptoms.  Patient Active Problem List   Diagnosis Date Noted  . Acute non-recurrent pansinusitis 03/12/2020  . Moderate episode of recurrent major depressive disorder ([place]) 12/04/2019  . Spondylosis of cervical region without myelopathy or radiculopathy 09/21/2019  . DDD (degenerative disc disease), cervical 09/21/2019  . Cervicalgia 09/21/2019  . Cervical fusion syndrome 09/21/2019  . Chronic pain syndrome 09/21/2019  . Degenerative lumbar spinal stenosis 08/15/2019  . History of adenomatous polyp of colon 11/04/2017  . Cervical radiculopathy 08/02/2017  . Neuropathy 08/02/2017  . Essential hypertension 07/28/2017  . Closed compression fracture of L5 lumbar vertebra 07/07/2017  . Sacral insufficiency fracture with routine healing 07/07/2017  . COPD (chronic obstructive pulmonary disease) ([place]) 04/10/2015  . Family history of malignant neoplasm of pancreas 04/03/2015  . Hypercholesteremia 04/03/2015  . Disorder of iron metabolism 04/03/2015  . Weight loss 04/03/2015  . Delayed onset of urination 04/03/2015  . Acid reflux 10/11/2014  . Closed fracture of distal phalanx of thumb 08/15/2014  . Arthritis, degenerative 01/30/2014  . Arthritis or  polyarthritis, rheumatoid ([place]) 01/30/2014  . Shortness of breath 12/22/2013  . SMOKER 09/22/2010  . Paroxysmal supraventricular tachycardia ([place]) 09/22/2010  . CHEST PAIN UNSPECIFIED 09/22/2010  . ELECTROCARDIOGRAM, ABNORMAL 09/22/2010  . B-complex deficiency 09/09/2007  . CN (constipation) 06/26/2007  . Clinical depression 06/26/2007  . Cold sore 06/26/2007  . H/O alcohol abuse 06/26/2007  . Cannot sleep 06/26/2007  . Localized osteoarthrosis, hand 06/26/2007  . Menopausal symptom 06/26/2007   Past Medical History:  Diagnosis Date  . Alcohol abuse   . Anemia   . Anxiety   . Arthritis   . Cervicalgia   . Cirrhosis ([place]) 1994  . COPD (chronic obstructive pulmonary disease) ([place])   . Depression   . Dyspnea   . GERD (gastroesophageal reflux disease)   . Headache   . Heart murmur    on heard when pt is lying  . Other and unspecified hyperlipidemia   . Tachycardia    d/t questionable anxiety happens every 5- 10 years, sees [facility]       Medications: Outpatient Medications Prior to Visit  Medication Sig  . ANORO ELLIPTA 62.5-25 MCG/INH AEPB Inhale 1 puff by mouth once daily  . ARIPiprazole (ABILIFY) 10 MG tablet Take 1 tablet by mouth once daily  . atorvastatin (LIPITOR) 40 MG tablet Take 1 tablet by mouth once daily  . benzonatate (TESSALON) 200 MG capsule Take 1 capsule (200 mg total) by mouth 3 (three) times daily as needed.  . busPIRone (BUSPAR) 30 MG tablet Take 1 tablet (30 mg total) by mouth 3 (three) times daily.  Marland Kitchen diltiazem (CARDIZEM CD) 180 MG 24 hr capsule Take 1 capsule (180  mg total) by mouth daily.  Marland Kitchen doxycycline (VIBRA-TABS) 100 MG tablet Take 1 tablet (100 mg total) by mouth 2 (two) times daily.  Marland Kitchen ezetimibe (ZETIA) 10 MG tablet Take 1 tablet (10 mg total) by mouth daily.  . fluticasone (FLONASE) 50 MCG/ACT nasal spray Place 2 sprays into both nostrils daily.  . furosemide (LASIX) 20 MG tablet TAKE 1 TABLET BY MOUTH ONCE DAILY AS NEEDED FOR FLUID   . gabapentin (NEURONTIN) 600 MG tablet Take 1 tablet (600 mg total) by mouth 3 (three) times daily.  . Guaifenesin (MUCINEX MAXIMUM STRENGTH) 1200 MG TB12 Take 1,200 mg by mouth daily.  Marland Kitchen ibuprofen (ADVIL) 200 MG tablet Take 3 tablets (600 mg total) by mouth every 8 (eight) hours as needed. For pain  . ipratropium (ATROVENT) 0.02 % nebulizer solution Take 2.5 mLs (0.5 mg total) by nebulization 4 (four) times daily. (Patient taking differently: Take 0.5 mg by nebulization 4 (four) times daily as needed for wheezing or shortness of breath. )  . leflunomide (ARAVA) 10 MG tablet Take 10 mg by mouth at bedtime.   Marland Kitchen LINZESS 145 MCG CAPS capsule Take 145 mcg by mouth daily before breakfast.  . meloxicam (MOBIC) 15 MG tablet Take 1 tablet (15 mg total) by mouth at bedtime.  . Multiple Vitamin (MULTIVITAMIN WITH MINERALS) TABS tablet Take 1 tablet by mouth daily.  . Nutritional Supplements (ESTROVEN ENERGY PO) Take 1 tablet by mouth daily.   Marland Kitchen omeprazole (PRILOSEC) 40 MG capsule Take 1 capsule (40 mg total) by mouth 2 (two) times daily.  Marland Kitchen oxyCODONE (ROXICODONE) 5 MG immediate release tablet Take 1 tablet (5 mg total) by mouth every 4 (four) hours as needed for severe pain.  . pantoprazole (PROTONIX) 40 MG tablet Take 40 mg by mouth daily before breakfast.  . potassium chloride SA (K-DUR) 20 MEQ tablet TAKE 1  BY MOUTH ONCE DAILY TAKE  WITH  FUROSEMIDE (Patient taking differently: Take 20 mEq by mouth daily as needed (take with furosemide (fluid retention)). )  . predniSONE (DELTASONE) 10 MG tablet Take 6 tablets PO on day 1 and day 2, take 5 tablets PO on day 3 and day 4, take 4 tablets PO on day 5 and day 6, take 3 tablets PO on day 7 and day 8, take 2 tablets PO on day 9 and day 10, take one tablet PO on day 11 and day 12.  . sertraline (ZOLOFT) 100 MG tablet Take 1 tablet (100 mg total) by mouth daily.  Marland Kitchen topiramate (TOPAMAX) 25 MG tablet Take 1 tablet (25 mg total) by mouth 2 (two) times daily.  .  traZODone (DESYREL) 100 MG tablet Take 1-3 tablets (100-300 mg total) by mouth at bedtime.  . valACYclovir (VALTREX) 1000 MG tablet TAKE 2 TABLETS BY MOUTH TWICE DAILY AS NEEDED FOR 5 DAYS  . venlafaxine XR (EFFEXOR-XR) 75 MG 24 hr capsule TAKE 3 CAPSULES BY MOUTH ONCE DAILY WITH BREAKFAST  . vitamin B-12 (CYANOCOBALAMIN) 1000 MCG tablet Take 6,000 mcg by mouth daily.  . vitamin E 400 UNIT capsule Take 1,600 Units by mouth daily.  Marland Kitchen XIIDRA 5 % SOLN Place 1 drop into both eyes daily.   No facility-administered medications prior to visit.    Allergies  Allergen Reactions  . Plaquenil [Hydroxychloroquine] Rash    Patient Care Team: [name], PA-C as PCP - General (Family Medicine) Minna Merritts, MD as PCP - Cardiology (Cardiology) Manya Silvas, MD (Inactive) (Gastroenterology)  Review of Systems  Constitutional: Positive for appetite change and fatigue.  HENT: Positive for congestion, sinus pressure, sore throat and voice change.   Eyes: Positive for pain.  Respiratory: Positive for cough and wheezing.   Genitourinary: Positive for difficulty urinating.  Musculoskeletal: Positive for back pain and neck pain.  Allergic/Immunologic: Positive for environmental allergies.  Neurological: Positive for headaches.  Psychiatric/Behavioral: Positive for sleep disturbance.    Last CBC Lab Results  Component Value Date   WBC 6.7 04/19/2020   HGB 12.4 04/19/2020   HCT 36.8 04/19/2020   MCV 101 (H) 04/19/2020   MCH 34.2 (H) 04/19/2020   RDW 11.9 04/19/2020   PLT 212 34/19/3790   Last metabolic panel Lab Results  Component Value Date   GLUCOSE 96 04/19/2020   NA 144 04/19/2020   K 3.6 04/19/2020   CL 114 (H) 04/19/2020   CO2 17 (L) 04/19/2020   BUN 8 04/19/2020   CREATININE 1.10 (H) 04/19/2020   GFRNONAA 54 (L) 04/19/2020   GFRAA 62 04/19/2020   CALCIUM 9.3 04/19/2020   PROT 6.4 04/19/2020   ALBUMIN 4.3 04/19/2020   LABGLOB 2.1 04/19/2020   AGRATIO 2.0  04/19/2020   BILITOT <0.2 04/19/2020   ALKPHOS 115 04/19/2020   AST 28 04/19/2020   ALT 25 04/19/2020   ANIONGAP 8 08/09/2019      Objective    Vitals: BP 127/79 (BP Location: Left Arm, Patient Position: Sitting, Cuff Size: Normal)   Pulse 73   Temp 97.7 F (36.5 C) (Oral)   Resp 16   Ht 5\' 9"  (1.753 m)   Wt 152 lb (68.9 kg)   BMI 22.45 kg/m  BP Readings from Last 3 Encounters:  04/19/20 127/79  12/18/19 130/90  12/04/19 134/83   Wt Readings from Last 3 Encounters:  04/19/20 152 lb (68.9 kg)  12/18/19 148 lb (67.1 kg)  12/04/19 153 lb (69.4 kg)      Physical Exam Vitals reviewed.  Constitutional:      General: She is not in acute distress.    Appearance: Normal appearance. She is well-developed. She is not ill-appearing or diaphoretic.  HENT:     Head: Normocephalic and atraumatic.     Right Ear: Tympanic membrane, ear canal and external ear normal.     Left Ear: Tympanic membrane, ear canal and external ear normal.  Eyes:     General: No scleral icterus.       Right eye: No discharge.        Left eye: No discharge.     Extraocular Movements: Extraocular movements intact.     Conjunctiva/sclera: Conjunctivae normal.     Pupils: Pupils are equal, round, and reactive to light.  Neck:     Thyroid: No thyromegaly.     Vascular: No carotid bruit or JVD.     Trachea: No tracheal deviation.  Cardiovascular:     Rate and Rhythm: Normal rate and regular rhythm.     Pulses: Normal pulses.     Heart sounds: Normal heart sounds. No murmur heard.  No friction rub. No gallop.   Pulmonary:     Effort: Pulmonary effort is normal. No respiratory distress.     Breath sounds: Normal breath sounds. No wheezing or rales.  Chest:     Chest wall: No tenderness.  Abdominal:     General: Abdomen is flat. Bowel sounds are normal. There is no distension.     Palpations: Abdomen is soft. There is no mass.     Tenderness: There is  no abdominal tenderness. There is no guarding or  rebound.  Musculoskeletal:        General: No tenderness.     Cervical back: Neck supple. Decreased range of motion.     Right lower leg: No edema.     Left lower leg: No edema.  Lymphadenopathy:     Cervical: No cervical adenopathy.  Skin:    General: Skin is warm and dry.     Capillary Refill: Capillary refill takes less than 2 seconds.     Findings: No rash.  Neurological:     Mental Status: She is alert and oriented to person, place, and time. Mental status is at baseline.     Gait: Gait abnormal (antalgic).  Psychiatric:        Mood and Affect: Mood normal.        Behavior: Behavior normal.        Thought Content: Thought content normal.        Judgment: Judgment normal.      Most recent functional status assessment: In your present state of health, do you have any difficulty performing the following activities: 04/19/2020  Hearing? N  Vision? N  Difficulty concentrating or making decisions? N  Walking or climbing stairs? N  Dressing or bathing? N  Doing errands, shopping? N  Some recent data might be hidden   Most recent fall risk assessment: Fall Risk  04/19/2020  Falls in the past year? 0  Comment -  Number falls in past yr: 0  Injury with Fall? 0  Risk for fall due to : -  Follow up Falls evaluation completed    Most recent depression screenings: PHQ 2/9 Scores 04/19/2020 12/04/2019  PHQ - 2 Score 0 1  PHQ- 9 Score - 11   Most recent cognitive screening: No flowsheet data found. Most recent Audit-C alcohol use screening Alcohol Use Disorder Test (AUDIT) 04/19/2020  1. How often do you have a drink containing alcohol? 0  2. How many drinks containing alcohol do you have on a typical day when you are drinking? 0  3. How often do you have six or more drinks on one occasion? 0  AUDIT-C Score 0  Alcohol Brief Interventions/Follow-up AUDIT Score <7 follow-up not indicated   A score of 3 or more in women, and 4 or more in men indicates increased risk for alcohol  abuse, EXCEPT if all of the points are from question 1   No results found for any visits on 04/19/20.  Assessment & Plan     Annual wellness visit done today including the all of the following: Reviewed patient's Family Medical History Reviewed and updated list of patient's medical providers Assessment of cognitive impairment was done Assessed patient's functional ability Established a written schedule for health screening [place] Completed and Reviewed  Exercise Activities and Dietary recommendations Goals   None     Immunization History  Administered Date(s) Administered  . Influenza Split 09/07/2012  . Influenza,inj,Quad PF,6+ Mos 11/18/2015, 07/23/2016, 07/02/2017, 04/14/2018, 04/17/2019  . Influenza-Unspecified 09/24/2014  . PFIZER SARS-COV-2 Vaccination 11/21/2019, 12/12/2019  . Pneumococcal Polysaccharide-23 02/12/2011, 09/24/2014  . Tdap 02/12/2011, 11/15/2015    Health Maintenance  Topic Date Due  . INFLUENZA VACCINE  03/17/2020  . MAMMOGRAM  03/26/2021  . PAP SMEAR-Modifier  05/18/2022  . COLONOSCOPY  11/02/2022  . TETANUS/TDAP  11/14/2025  . COVID-19 Vaccine  Completed  . Hepatitis C Screening  Completed  . HIV Screening  Discontinued  Discussed health benefits of physical activity, and encouraged her to engage in regular exercise appropriate for her age and condition.    1. Annual physical exam Normal physical exam today. Will check labs as below and f/u pending lab results. If labs are stable and WNL she will not need to have these rechecked for one year at her next annual physical exam. She is to call the office in the meantime if she has any acute issue, questions or concerns.  2. Encounter for breast cancer screening using non-mammogram modality There is no family history of breast cancer. She does perform regular self breast exams. Mammogram was ordered as below. Information for [facility] Breast clinic was given to patient so  she may schedule her mammogram at her convenience. - MM 3D SCREEN BREAST BILATERAL; Future  3. Mixed simple and mucopurulent chronic bronchitis (HCC) Stable. Diagnosis pulled for medication refill. Continue current medical treatment plan. Pneumonia vaccine updated. Will check labs as below and f/u pending results. - CBC with Differential/Platelet - Comprehensive metabolic panel - Lipid panel - Fluticasone-Umeclidin-Vilant (TRELEGY ELLIPTA) 100-62.5-25 MCG/INH AEPB; Inhale 1 puff into the lungs daily.  Dispense: 60 each; Refill: 11 - Pneumococcal polysaccharide vaccine 23-valent greater than or equal to 2yo subcutaneous/IM  4. Essential hypertension Stable. Continue diltiazem 180mg  daily. Will check labs as below and f/u pending results. - CBC with Differential/Platelet - Comprehensive metabolic panel - Lipid panel  5. Hypercholesteremia Stable. Continue Atorvastatin and Zetia. Will check labs as below and f/u pending results. - CBC with Differential/Platelet - Comprehensive metabolic panel - Lipid panel  6. Need for influenza vaccination Flu vaccine given today without complication. Patient sat upright for 15 minutes to check for adverse reaction before being released. - Flu Vaccine QUAD 36+ mos IM  7. Need for 23-polyvalent pneumococcal polysaccharide vaccine Pneumococcal 23 Vaccine given to patient without complications. Patient sat for 15 minutes after administration and was tolerated well without adverse effects. - Pneumococcal polysaccharide vaccine 23-valent greater than or equal to 2yo subcutaneous/IM  8. Difficulty urinating Discussed Urology referral but patient declines at this time. Advised to push fluids, particularly water, and stay well hydrated.    No follow-ups on file.     Reynolds Bowl, PA-C, have reviewed all documentation for this visit. The documentation on 04/30/20 for the exam, diagnosis, procedures, and orders are all accurate and  complete.   Rubye Beach  [facility] [phone] (phone) [phone] (fax)  [place]

## 2020-04-19 ENCOUNTER — Other Ambulatory Visit: Payer: Self-pay

## 2020-04-19 ENCOUNTER — Ambulatory Visit (INDEPENDENT_AMBULATORY_CARE_PROVIDER_SITE_OTHER): Payer: PPO | Admitting: Physician Assistant

## 2020-04-19 ENCOUNTER — Encounter: Payer: Self-pay | Admitting: Physician Assistant

## 2020-04-19 VITALS — BP 127/79 | HR 73 | Temp 97.7°F | Resp 16 | Ht 69.0 in | Wt 152.0 lb

## 2020-04-19 DIAGNOSIS — R39198 Other difficulties with micturition: Secondary | ICD-10-CM

## 2020-04-19 DIAGNOSIS — J418 Mixed simple and mucopurulent chronic bronchitis: Secondary | ICD-10-CM | POA: Diagnosis not present

## 2020-04-19 DIAGNOSIS — I10 Essential (primary) hypertension: Secondary | ICD-10-CM

## 2020-04-19 DIAGNOSIS — Z23 Encounter for immunization: Secondary | ICD-10-CM | POA: Diagnosis not present

## 2020-04-19 DIAGNOSIS — Z1239 Encounter for other screening for malignant neoplasm of breast: Secondary | ICD-10-CM

## 2020-04-19 DIAGNOSIS — Z0000 Encounter for general adult medical examination without abnormal findings: Secondary | ICD-10-CM

## 2020-04-19 DIAGNOSIS — E7800 Pure hypercholesterolemia, unspecified: Secondary | ICD-10-CM | POA: Diagnosis not present

## 2020-04-19 MED ORDER — TRELEGY ELLIPTA 100-62.5-25 MCG/INH IN AEPB: 1.0000 | INHALATION_SPRAY | Freq: Every day | RESPIRATORY_TRACT | 11 refills | Status: DC

## 2020-04-19 NOTE — Patient Instructions (Signed)
[facility] [place],  [place]  [id number] [address]: [phone]   Health Maintenance for Postmenopausal Women Menopause is a normal process in which your ability to get pregnant comes to an end. This process happens slowly over many months or years, usually between the ages of 41 and 46. Menopause is complete when you have missed your menstrual periods for 12 months. It is important to talk with your health care provider about some of the most common conditions that affect women after menopause (postmenopausal women). These include heart disease, cancer, and bone loss (osteoporosis). Adopting a healthy lifestyle and getting preventive care can help to promote your health and wellness. The actions you take can also lower your chances of developing some of these common conditions. What should I know about menopause? During menopause, you may get a number of symptoms, such as:  Hot flashes. These can be moderate or severe.  Night sweats.  Decrease in sex drive.  Mood swings.  Headaches.  Tiredness.  Irritability.  Memory problems.  Insomnia. Choosing to treat or not to treat these symptoms is a decision that you make with your health care provider. Do I need hormone replacement therapy?  Hormone replacement therapy is effective in treating symptoms that are caused by menopause, such as hot flashes and night sweats.  Hormone replacement carries certain risks, especially as you become older. If you are thinking about using estrogen or estrogen with progestin, discuss the benefits and risks with your health care provider. What is my risk for heart disease and stroke? The risk of heart disease, heart attack, and stroke increases as you age. One of the causes may be a change in the body's hormones during menopause. This can affect how your body uses dietary fats, triglycerides, and cholesterol. Heart attack and stroke are medical  emergencies. There are many things that you can do to help prevent heart disease and stroke. Watch your blood pressure  High blood pressure causes heart disease and increases the risk of stroke. This is more likely to develop in people who have high blood pressure readings, are of African descent, or are overweight.  Have your blood pressure checked: ? Every 3-5 years if you are 66-79 years of age. ? Every year if you are 22 years old or older. Eat a healthy diet   Eat a diet that includes plenty of vegetables, fruits, low-fat dairy products, and lean protein.  Do not eat a lot of foods that are high in solid fats, added sugars, or sodium. Get regular exercise Get regular exercise. This is one of the most important things you can do for your health. Most adults should:  Try to exercise for at least 150 minutes each week. The exercise should increase your heart rate and make you sweat (moderate-intensity exercise).  Try to do strengthening exercises at least twice each week. Do these in addition to the moderate-intensity exercise.  Spend less time sitting. Even light physical activity can be beneficial. Other tips  Work with your health care provider to achieve or maintain a healthy weight.  Do not use any products that contain nicotine or tobacco, such as cigarettes, e-cigarettes, and chewing tobacco. If you need help quitting, ask your health care provider.  Know your numbers. Ask your health care provider to check your cholesterol and your blood sugar (glucose). Continue to have your blood tested as directed by your health care provider. Do I need screening for cancer? Depending on  your health history and family history, you may need to have cancer screening at different stages of your life. This may include screening for:  Breast cancer.  Cervical cancer.  Lung cancer.  Colorectal cancer. What is my risk for osteoporosis? After menopause, you may be at increased risk for  osteoporosis. Osteoporosis is a condition in which bone destruction happens more quickly than new bone creation. To help prevent osteoporosis or the bone fractures that can happen because of osteoporosis, you may take the following actions:  If you are 61-55 years old, get at least 1,000 mg of calcium and at least 600 mg of vitamin D per day.  If you are older than age 74 but younger than age 32, get at least 1,200 mg of calcium and at least 600 mg of vitamin D per day.  If you are older than age 28, get at least 1,200 mg of calcium and at least 800 mg of vitamin D per day. Smoking and drinking excessive alcohol increase the risk of osteoporosis. Eat foods that are rich in calcium and vitamin D, and do weight-bearing exercises several times each week as directed by your health care provider. How does menopause affect my mental health? Depression may occur at any age, but it is more common as you become older. Common symptoms of depression include:  Low or sad mood.  Changes in sleep patterns.  Changes in appetite or eating patterns.  Feeling an overall lack of motivation or enjoyment of activities that you previously enjoyed.  Frequent crying spells. Talk with your health care provider if you think that you are experiencing depression. General instructions See your health care provider for regular wellness exams and vaccines. This may include:  Scheduling regular health, dental, and eye exams.  Getting and maintaining your vaccines. These include: ? Influenza vaccine. Get this vaccine each year before the flu season begins. ? Pneumonia vaccine. ? Shingles vaccine. ? Tetanus, diphtheria, and pertussis (Tdap) booster vaccine. Your health care provider may also recommend other immunizations. Tell your health care provider if you have ever been abused or do not feel safe at home. Summary  Menopause is a normal process in which your ability to get pregnant comes to an end.  This  condition causes hot flashes, night sweats, decreased interest in sex, mood swings, headaches, or lack of sleep.  Treatment for this condition may include hormone replacement therapy.  Take actions to keep yourself healthy, including exercising regularly, eating a healthy diet, watching your weight, and checking your blood pressure and blood sugar levels.  Get screened for cancer and depression. Make sure that you are up to date with all your vaccines. This information is not intended to replace advice given to you by your health care provider. Make sure you discuss any questions you have with your health care provider. Document Revised: 07/27/2018 Document Reviewed: 07/27/2018 Elsevier Patient Education  2020 [employer].

## 2020-04-20 LAB — COMPREHENSIVE METABOLIC PANEL
ALT: 25 IU/L (ref 0–32)
AST: 28 IU/L (ref 0–40)
Albumin/Globulin Ratio: 2 (ref 1.2–2.2)
Albumin: 4.3 g/dL (ref 3.8–4.8)
Alkaline Phosphatase: 115 IU/L (ref 48–121)
BUN/Creatinine Ratio: 7 — ABNORMAL LOW (ref 12–28)
BUN: 8 mg/dL (ref 8–27)
Bilirubin Total: <0.2 mg/dL (ref 0.0–1.2)
CO2: 17 mmol/L — ABNORMAL LOW (ref 20–29)
Calcium: 9.3 mg/dL (ref 8.7–10.3)
Chloride: 114 mmol/L — ABNORMAL HIGH (ref 96–106)
Creatinine, Ser: 1.1 mg/dL — ABNORMAL HIGH (ref 0.57–1.00)
GFR calc Af Amer: 62 mL/min/{1.73_m2} (ref >59)
GFR calc non Af Amer: 54 mL/min/{1.73_m2} — ABNORMAL LOW (ref >59)
Globulin, Total: 2.1 g/dL (ref 1.5–4.5)
Glucose: 96 mg/dL (ref 65–99)
Potassium: 3.6 mmol/L (ref 3.5–5.2)
Sodium: 144 mmol/L (ref 134–144)
Total Protein: 6.4 g/dL (ref 6.0–8.5)

## 2020-04-20 LAB — LIPID PANEL
Chol/HDL Ratio: 3.1 ratio (ref 0.0–4.4)
Cholesterol, Total: 172 mg/dL (ref 100–199)
HDL: 56 mg/dL (ref >39)
LDL Chol Calc (NIH): 98 mg/dL (ref 0–99)
Triglycerides: 102 mg/dL (ref 0–149)
VLDL Cholesterol Cal: 18 mg/dL (ref 5–40)

## 2020-04-20 LAB — CBC WITH DIFFERENTIAL/PLATELET
Basophils Absolute: 0.1 10*3/uL (ref 0.0–0.2)
Basos: 1 %
EOS (ABSOLUTE): 0.2 10*3/uL (ref 0.0–0.4)
Eos: 3 %
Hematocrit: 36.8 % (ref 34.0–46.6)
Hemoglobin: 12.4 g/dL (ref 11.1–15.9)
Immature Grans (Abs): 0 10*3/uL (ref 0.0–0.1)
Immature Granulocytes: 0 %
Lymphocytes Absolute: 1.4 10*3/uL (ref 0.7–3.1)
Lymphs: 21 %
MCH: 34.2 pg — ABNORMAL HIGH (ref 26.6–33.0)
MCHC: 33.7 g/dL (ref 31.5–35.7)
MCV: 101 fL — ABNORMAL HIGH (ref 79–97)
Monocytes Absolute: 0.4 10*3/uL (ref 0.1–0.9)
Monocytes: 6 %
Neutrophils Absolute: 4.6 10*3/uL (ref 1.4–7.0)
Neutrophils: 69 %
Platelets: 212 10*3/uL (ref 150–450)
RBC: 3.63 x10E6/uL — ABNORMAL LOW (ref 3.77–5.28)
RDW: 11.9 % (ref 11.7–15.4)
WBC: 6.7 10*3/uL (ref 3.4–10.8)

## 2020-04-21 ENCOUNTER — Encounter: Payer: Self-pay | Admitting: Physician Assistant

## 2020-04-21 DIAGNOSIS — J418 Mixed simple and mucopurulent chronic bronchitis: Secondary | ICD-10-CM

## 2020-04-23 ENCOUNTER — Telehealth: Payer: Self-pay

## 2020-04-23 DIAGNOSIS — R748 Abnormal levels of other serum enzymes: Secondary | ICD-10-CM

## 2020-04-23 NOTE — Telephone Encounter (Signed)
-----   Message from [name], [place] sent at 04/23/2020 12:05 PM EDT ----- Blood count is stable and overall normal. Kidney function has declined just slightly. This can sometimes be due to dehydration. Would recommend to push fluids and [place] recheck non-fasting. If still lower, could be from chronic NSAID use for pain. May have to re-evaluate medications used if the kidney function is still showing some decline after pushing fluids and rechecking. Liver enzymes are normal. Sodium, potassium, and calcium are normal. Cholesterol is normal.

## 2020-04-23 NOTE — Addendum Note (Signed)
Addended by: [name] on: 04/23/2020 01:17 PM   Modules accepted: Orders

## 2020-04-24 ENCOUNTER — Telehealth: Payer: Self-pay | Admitting: *Deleted

## 2020-04-24 DIAGNOSIS — M0579 Rheumatoid arthritis with rheumatoid factor of multiple sites without organ or systems involvement: Secondary | ICD-10-CM | POA: Diagnosis not present

## 2020-04-24 NOTE — Chronic Care Management (AMB) (Signed)
  Chronic Care Management   Note  04/24/2020 Name: Gabrielle Pineda MRN: [id number] DOB: 05/07/1956  [name] is a 64 y.o. year old female who is a primary care patient of Rubye Beach. I reached out to [name] by phone today in response to a referral sent by [name] PCP, [name], PA-C.     [name] was given information about Chronic Care Management services today including:  1. CCM service includes personalized support from designated clinical staff supervised by her physician, including individualized plan of care and coordination with other care providers 2. 24/7 contact phone numbers for assistance for urgent and routine care needs. 3. Service will only be billed when office clinical staff spend 20 minutes or more in a month to coordinate care. 4. Only one practitioner may furnish and bill the service in a calendar month. 5. The patient may stop CCM services at any time (effective at the end of the month) by phone call to the office staff. 6. The patient will be responsible for cost sharing (co-pay) of up to 20% of the service fee (after annual deductible is met).  Patient agreed to services and verbal consent obtained.   Follow up plan: Telephone appointment with care management team member scheduled for: 05/06/2020  [place] [facility]

## 2020-04-25 DIAGNOSIS — K219 Gastro-esophageal reflux disease without esophagitis: Secondary | ICD-10-CM | POA: Diagnosis not present

## 2020-04-25 DIAGNOSIS — B379 Candidiasis, unspecified: Secondary | ICD-10-CM | POA: Diagnosis not present

## 2020-04-25 DIAGNOSIS — J040 Acute laryngitis: Secondary | ICD-10-CM | POA: Diagnosis not present

## 2020-04-30 ENCOUNTER — Encounter: Payer: Self-pay | Admitting: Physician Assistant

## 2020-05-06 ENCOUNTER — Ambulatory Visit: Payer: PPO | Admitting: Pharmacist

## 2020-05-06 ENCOUNTER — Other Ambulatory Visit: Payer: Self-pay

## 2020-05-06 DIAGNOSIS — R748 Abnormal levels of other serum enzymes: Secondary | ICD-10-CM | POA: Diagnosis not present

## 2020-05-06 DIAGNOSIS — E7800 Pure hypercholesterolemia, unspecified: Secondary | ICD-10-CM

## 2020-05-06 DIAGNOSIS — I10 Essential (primary) hypertension: Secondary | ICD-10-CM

## 2020-05-06 NOTE — Chronic Care Management (AMB) (Signed)
Chronic Care Management Pharmacy  Name: Gabrielle Pineda  MRN: [id number] DOB: 20-Apr-1956  Chief Complaint/ HPI  Gabrielle Pineda,  64 y.o. , female presents for their Initial CCM visit with the clinical pharmacist via telephone due to COVID-19 Pandemic.  PCP : [name], PA-C  Their chronic conditions include: HTN, HLD, Tobacco  Office Visits: 9/3 AWV, [place], BP 127/79 P 73 Wt 152, exercise 3-4 qwk, trouble urinating, push fluids, mucopurulent bronchitis 7/23 sinusitis, [place], prednisone, Augmentin, Tessalon 217m  Consult Visit: 8/2 DDD, LHolley Raring Cirrhosis, tachycardia,  Anoro Ellipta, active smoker, Topamax, gabapentin for neuropathic pain  Medications: Outpatient Encounter Medications as of 05/06/2020  Medication Sig  . ARIPiprazole (ABILIFY) 10 MG tablet Take 1 tablet by mouth once daily  . atorvastatin (LIPITOR) 40 MG tablet Take 1 tablet by mouth once daily  . busPIRone (BUSPAR) 30 MG tablet Take 1 tablet (30 mg total) by mouth 3 (three) times daily.  .Marland Kitchendiltiazem (CARDIZEM CD) 180 MG 24 hr capsule Take 1 capsule (180 mg total) by mouth daily.  .Marland Kitchenezetimibe (ZETIA) 10 MG tablet Take 1 tablet (10 mg total) by mouth daily.  . fluticasone (FLONASE) 50 MCG/ACT nasal spray Place 2 sprays into both nostrils daily.  . Fluticasone-Umeclidin-Vilant (TRELEGY ELLIPTA) 100-62.5-25 MCG/INH AEPB Inhale 1 puff into the lungs daily.  . furosemide (LASIX) 20 MG tablet TAKE 1 TABLET BY MOUTH ONCE DAILY AS NEEDED FOR FLUID  . gabapentin (NEURONTIN) 600 MG tablet Take 1 tablet (600 mg total) by mouth 3 (three) times daily.  . Guaifenesin (MUCINEX MAXIMUM STRENGTH) 1200 MG TB12 Take 1,200 mg by mouth daily.  .Marland Kitchenibuprofen (ADVIL) 200 MG tablet Take 3 tablets (600 mg total) by mouth every 8 (eight) hours as needed. For pain  . ipratropium (ATROVENT) 0.02 % nebulizer solution Take 2.5 mLs (0.5 mg total) by nebulization 4 (four) times daily. (Patient taking differently: Take 0.5 mg  by nebulization 4 (four) times daily as needed for wheezing or shortness of breath. )  . leflunomide (ARAVA) 10 MG tablet Take 10 mg by mouth at bedtime.   .Marland KitchenLINZESS 145 MCG CAPS capsule Take 145 mcg by mouth daily before breakfast.  . meloxicam (MOBIC) 15 MG tablet Take 1 tablet (15 mg total) by mouth at bedtime.  . Multiple Vitamin (MULTIVITAMIN WITH MINERALS) TABS tablet Take 1 tablet by mouth daily.  . Nutritional Supplements (ESTROVEN ENERGY PO) Take 1 tablet by mouth daily.   .Marland Kitchenomeprazole (PRILOSEC) 40 MG capsule Take 1 capsule (40 mg total) by mouth 2 (two) times daily.  .Marland KitchenoxyCODONE (ROXICODONE) 5 MG immediate release tablet Take 1 tablet (5 mg total) by mouth every 4 (four) hours as needed for severe pain.  . potassium chloride SA (K-DUR) 20 MEQ tablet TAKE 1  BY MOUTH ONCE DAILY TAKE  WITH  FUROSEMIDE (Patient taking differently: Take 20 mEq by mouth daily as needed (take with furosemide (fluid retention)). )  . sertraline (ZOLOFT) 100 MG tablet Take 1 tablet (100 mg total) by mouth daily.  .Marland Kitchentopiramate (TOPAMAX) 25 MG tablet Take 1 tablet (25 mg total) by mouth 2 (two) times daily.  . traZODone (DESYREL) 100 MG tablet Take 1-3 tablets (100-300 mg total) by mouth at bedtime. (Patient taking differently: Take 300 mg by mouth at bedtime. )  . valACYclovir (VALTREX) 1000 MG tablet TAKE 2 TABLETS BY MOUTH TWICE DAILY AS NEEDED FOR 5 DAYS  . venlafaxine XR (EFFEXOR-XR) 75 MG 24 hr capsule TAKE 3 CAPSULES BY  MOUTH ONCE DAILY WITH BREAKFAST  . vitamin B-12 (CYANOCOBALAMIN) 1000 MCG tablet Take 6,000 mcg by mouth daily.  . vitamin E 400 UNIT capsule Take 1,600 Units by mouth daily.  Marland Kitchen XIIDRA 5 % SOLN Place 1 drop into both eyes daily.  . pantoprazole (PROTONIX) 40 MG tablet Take 40 mg by mouth daily before breakfast. (Patient not taking: Reported on 05/06/2020)  . [DISCONTINUED] sertraline (ZOLOFT) 100 MG tablet Take 1 tablet (100 mg total) by mouth daily.   No facility-administered encounter  medications on file as of 05/06/2020.      Financial Resource Strain: High Risk  . Difficulty of Paying Living Expenses: Hard    Current Diagnosis/Assessment:  Goals Addressed            This Visit's Progress   . Chronic Care Management       CARE PLAN ENTRY (see longitudinal plan of care for additional care plan information)  Current Barriers:  . Chronic Disease Management support, education, and care coordination needs related to Hypertension, Hyperlipidemia, and Tobacco use   Hypertension BP Readings from Last 3 Encounters:  04/19/20 127/79  12/18/19 130/90  12/04/19 134/83   . Pharmacist Clinical Goal(s): o Over the next 90 days, patient will work with PharmD and providers to maintain BP goal <140/90 . Current regimen:  o Diltiazem CD 134m daily . Interventions: o None . Patient self care activities - Over the next 90 days, patient will: o Check BP weekly, document, and provide at future appointments o Ensure daily salt intake < 2300 mg/day  Hyperlipidemia Lab Results  Component Value Date/Time   LDLCALC 98 04/19/2020 10:26 AM   . Pharmacist Clinical Goal(s): o Over the next 90 days, patient will work with PharmD and providers to maintain LDL goal < 100 . Current regimen:  o Lipitor 457mdaily o Zetia 1069maily . Interventions: o None . Patient self care activities - Over the next 90 days, patient will: o Continue to take atorvastatin and ezetimibe every day  Tobacco Abuse . Pharmacist Clinical Goal(s) o Over the next 90 days, patient will work with PharmD and providers to stop smoking . Current regimen:  o Approximately 20 cigarettes daily . Interventions: o [place] QuitLine o 1800QUITNOW o Recommended 1m19mtch and nicotine gum 4mg 33matient self care activities - Over the next 90 days, patient will: o [facility] when and participate in counseling  Medication management . Pharmacist Clinical Goal(s): o Over the next 90 days, patient  will work with PharmD and providers to maintain optimal medication adherence . Current pharmacy: [facility] . Interventions o Comprehensive medication review performed. o Utilize [facility] for medication synchronization, packaging and delivery . Patient self care activities - Over the next 90 days, patient will: o Focus on medication adherence by helping [facility] to make her medication deliveries 100% accurate o Take medications as prescribed o Report any questions or concerns to PharmD and/or provider(s)  Initial goal documentation       Hypertension   BP goal is:  <140/90  Office blood pressures are  BP Readings from Last 3 Encounters:  04/19/20 127/79  12/18/19 130/90  12/04/19 134/83   Patient checks BP at home daily Patient home BP readings are ranging: pulse in 80s, 152/72  Patient has failed these meds in the past: NA Patient is currently controlled on the following medications:  . Diltiazem CD 180mg 23my  We discussed: At goal Diltiazem mostly for tachycardia Denies hypotension  Plan  Continue current medications   Hyperlipidemia   LDL goal < 100  Lipid Panel     Component Value Date/Time   CHOL 172 04/19/2020 1026   TRIG 102 04/19/2020 1026   HDL 56 04/19/2020 1026   LDLCALC 98 04/19/2020 1026    Hepatic Function Latest Ref Rng & Units 05/06/2020 04/19/2020 08/09/2019  Total Protein 6.0 - 8.5 g/dL - 6.4 6.3(L)  Albumin 3.8 - 4.8 g/dL 4.2 4.3 4.0  AST 0 - 40 IU/L - 28 24  ALT 0 - 32 IU/L - 25 19  Alk Phosphatase 48 - 121 IU/L - 115 101  Total Bilirubin 0.0 - 1.2 mg/dL - <0.2 0.3  Bilirubin, Direct 0.00 - 0.40 mg/dL - - -     The 10-year ASCVD risk score Mikey Bussing DC Jr., et al., 2013) is: 10.3%   Values used to calculate the score:     Age: 20 years     Sex: Female     Is Non-Hispanic African American: No     Diabetic: No     Tobacco smoker: Yes     Systolic Blood Pressure: 962 mmHg     Is BP treated: Yes     HDL Cholesterol: 56  mg/dL     Total Cholesterol: 172 mg/dL   Patient has failed these meds in past: NA Patient is currently controlled on the following medications:  . Lipitor 87m daily . Zetia 169mdaily  We discussed:  At goal Denies myalgias  Plan  Continue current medications  Tobacco Abuse   Tobacco Status:  Social History   Tobacco Use  Smoking Status Current Every Day Smoker  . Packs/day: 1.00  . Years: 40.00  . Pack years: 40.00  Smokeless Tobacco Never Used   On a scale of 1-10, reports MOTIVATION to quit is 5 On a scale of 1-10, reports CONFIDENCE in quitting is 5  Previous quit attempts included: 2 Patient is currently uncontrolled on the following medications:  . None  We discussed:  Provided contact information for [place] Quit Line (1-800-QUIT-NOW) and encouraged patient to reach out to this group for support.  Trelegy superior to Anoro No rescue inhaler Uses Atrovent SVN twice daily, sometimes once Quit 1.5 years ago 20 cigarettes daily  Plan   Increase Flonase use Call [place] Quitline when ready Continue current medications  Medication Management   Pt uses [place]raperor all medications Uses pill box? Yes Pt endorses 100% compliance  Interested in [facility]  We discussed:  Busbar morning, midday, bedtime Pain 5 Max 7 Retroactive SSI disability Combined income near upper limits for PAP  Linzess $118/month Trelegy $325 for 3 months All meds in morning leflunomide $108/month Plaquenil rash only One a day for women Topamax 2574mor migraine prevention Valtrex - last outbreak 3 weeks ago Biotin 10000 mcg daily Xiidra 5% Oxycodone 2 -3 times weekly Ibuprofen 2 - 3 times weekly  All the rest in the morning before breakfast Buspar, gabapentin, omeprazaole, topamax around noon (2pm) Gabapentin, meloxicam, sertraline, leflotomide, trazodone, Buspar at bedtime Doesn't want supplements packaged  Plan  Utilize [facility] for medication  synchronization, packaging and delivery   Verbal consent obtained for [facility] enhanced pharmacy services (medication synchronization, adherence packaging, delivery coordination). A medication sync plan was created to allow patient to get all medications delivered once every 30 to 90 days per patient preference. Patient understands they have freedom to choose pharmacy and clinical pharmacist will coordinate care between all prescribers and [facility].  Follow up: 3 month phone visit  Milus Height, PharmD, [place], [place] [phone]

## 2020-05-07 ENCOUNTER — Telehealth: Payer: Self-pay

## 2020-05-07 LAB — RENAL FUNCTION PANEL
Albumin: 4.2 g/dL (ref 3.8–4.8)
BUN/Creatinine Ratio: 9 — ABNORMAL LOW (ref 12–28)
BUN: 9 mg/dL (ref 8–27)
CO2: 16 mmol/L — ABNORMAL LOW (ref 20–29)
Calcium: 9.1 mg/dL (ref 8.7–10.3)
Chloride: 112 mmol/L — ABNORMAL HIGH (ref 96–106)
Creatinine, Ser: 0.97 mg/dL (ref 0.57–1.00)
GFR calc Af Amer: 72 mL/min/{1.73_m2} (ref >59)
GFR calc non Af Amer: 62 mL/min/{1.73_m2} (ref >59)
Glucose: 80 mg/dL (ref 65–99)
Phosphorus: 3.2 mg/dL (ref 3.0–4.3)
Potassium: 3.3 mmol/L — ABNORMAL LOW (ref 3.5–5.2)
Sodium: 141 mmol/L (ref 134–144)

## 2020-05-07 NOTE — Telephone Encounter (Signed)
Written by [name], PA-C on 05/07/2020 3:19 PM EDT Seen by patient Gabrielle Pineda on 05/07/2020 4:52 PM

## 2020-05-07 NOTE — Patient Instructions (Addendum)
Visit Information  Goals Addressed            This Visit's Progress   . Chronic Care Management       CARE PLAN ENTRY (see longitudinal plan of care for additional care plan information)  Current Barriers:  . Chronic Disease Management support, education, and care coordination needs related to Hypertension, Hyperlipidemia, and Tobacco use   Hypertension BP Readings from Last 3 Encounters:  04/19/20 127/79  12/18/19 130/90  12/04/19 134/83   . Pharmacist Clinical Goal(s): o Over the next 90 days, patient will work with PharmD and providers to maintain BP goal <140/90 . Current regimen:  o Diltiazem CD 180mg  daily . Interventions: o None . Patient self care activities - Over the next 90 days, patient will: o Check BP weekly, document, and provide at future appointments o Ensure daily salt intake < 2300 mg/day  Hyperlipidemia Lab Results  Component Value Date/Time   LDLCALC 98 04/19/2020 10:26 AM   . Pharmacist Clinical Goal(s): o Over the next 90 days, patient will work with PharmD and providers to maintain LDL goal < 100 . Current regimen:  o Lipitor 40mg  daily o Zetia 10mg  daily . Interventions: o None . Patient self care activities - Over the next 90 days, patient will: o Continue to take atorvastatin and ezetimibe every day  Tobacco Abuse . Pharmacist Clinical Goal(s) o Over the next 90 days, patient will work with PharmD and providers to stop smoking . Current regimen:  o Approximately 20 cigarettes daily . Interventions: o [place] QuitLine o 1800QUITNOW o Recommended 21mg  patch and nicotine gum 4mg  . Patient self care activities - Over the next 90 days, patient will: o Call the [facility] Quitline when and participate in counseling  Medication management . Pharmacist Clinical Goal(s): o Over the next 90 days, patient will work with PharmD and providers to maintain optimal medication adherence . Current pharmacy: [facility] . Interventions o Comprehensive medication  review performed. o Utilize [facility] for medication synchronization, packaging and delivery . Patient self care activities - Over the next 90 days, patient will: o Focus on medication adherence by helping [facility] to make her medication deliveries 100% accurate o Take medications as prescribed o Report any questions or concerns to PharmD and/or provider(s)  Initial goal documentation        Gabrielle Pineda was given information about Chronic Care Management services today including:  1. CCM service includes personalized support from designated clinical staff supervised by her physician, including individualized plan of care and coordination with other care providers 2. 24/7 contact phone numbers for assistance for urgent and routine care needs. 3. Standard insurance, coinsurance, copays and deductibles apply for chronic care management only during months in which we provide at least 20 minutes of these services. Most insurances cover these services at 100%, however patients may be responsible for any copay, coinsurance and/or deductible if applicable. This service may help you avoid the need for more expensive face-to-face services. 4. Only one practitioner may furnish and bill the service in a calendar month. 5. The patient may stop CCM services at any time (effective at the end of the month) by phone call to the office staff.  Patient agreed to services and verbal consent obtained.   Print copy of patient instructions provided.  Telephone follow up appointment with pharmacy team member scheduled for: 3 months  Milus Height, PharmD, [place], [place] [phone]    Steps to Quit Smoking Smoking tobacco  is the leading cause of preventable death. It can affect almost every organ in the body. Smoking puts you and people around you at risk for many serious, long-lasting (chronic) diseases. Quitting smoking can be hard, but it is one  of the best things that you can do for your health. It is never too late to quit. How do I get ready to quit? When you decide to quit smoking, make a plan to help you succeed. Before you quit:  Pick a date to quit. Set a date within the next 2 weeks to give you time to prepare.  Write down the reasons why you are quitting. Keep this list in places where you will see it often.  Tell your family, friends, and co-workers that you are quitting. Their support is important.  Talk with your doctor about the choices that may help you quit.  Find out if your health insurance will pay for these treatments.  Know the people, places, things, and activities that make you want to smoke (triggers). Avoid them. What first steps can I take to quit smoking?  Throw away all cigarettes at home, at work, and in your car.  Throw away the things that you use when you smoke, such as ashtrays and lighters.  Clean your car. Make sure to empty the ashtray.  Clean your home, including curtains and carpets. What can I do to help me quit smoking? Talk with your doctor about taking medicines and seeing a counselor at the same time. You are more likely to succeed when you do both.  If you are pregnant or breastfeeding, talk with your doctor about counseling or other ways to quit smoking. Do not take medicine to help you quit smoking unless your doctor tells you to do so. To quit smoking: Quit right away  Quit smoking totally, instead of slowly cutting back on how much you smoke over a period of time.  Go to counseling. You are more likely to quit if you go to counseling sessions regularly. Take medicine You may take medicines to help you quit. Some medicines need a prescription, and some you can buy over-the-counter. Some medicines may contain a drug called nicotine to replace the nicotine in cigarettes. Medicines may:  Help you to stop having the desire to smoke (cravings).  Help to stop the problems that  come when you stop smoking (withdrawal symptoms). Your doctor may ask you to use:  Nicotine patches, gum, or lozenges.  Nicotine inhalers or sprays.  Non-nicotine medicine that is taken by mouth. Find resources Find resources and other ways to help you quit smoking and remain smoke-free after you quit. These resources are most helpful when you use them often. They include:  Online chats with a Social worker.  Phone quitlines.  Printed Furniture conservator/restorer.  Support groups or group counseling.  Text messaging programs.  Mobile phone apps. Use apps on your mobile phone or tablet that can help you stick to your quit plan. There are many free apps for mobile phones and tablets as well as websites. Examples include Quit Guide from the [employer] and smokefree.gov  What things can I do to make it easier to quit?   Talk to your family and friends. Ask them to support and encourage you.  Call a phone quitline (1-800-QUIT-NOW), reach out to support groups, or work with a Social worker.  Ask people who smoke to not smoke around you.  Avoid places that make you want to smoke, such as: ? Bars. ?  Parties. ? Smoke-break areas at work.  Spend time with people who do not smoke.  Lower the stress in your life. Stress can make you want to smoke. Try these things to help your stress: ? Getting regular exercise. ? Doing deep-breathing exercises. ? Doing yoga. ? Meditating. ? Doing a body scan. To do this, close your eyes, focus on one area of your body at a time from head to toe. Notice which parts of your body are tense. Try to relax the muscles in those areas. How will I feel when I quit smoking? Day 1 to 3 weeks Within the first 24 hours, you may start to have some problems that come from quitting tobacco. These problems are very bad 2-3 days after you quit, but they do not often last for more than 2-3 weeks. You may get these symptoms:  Mood swings.  Feeling restless, nervous, angry, or  annoyed.  Trouble concentrating.  Dizziness.  Strong desire for high-sugar foods and nicotine.  Weight gain.  Trouble pooping (constipation).  Feeling like you may vomit (nausea).  Coughing or a sore throat.  Changes in how the medicines that you take for other issues work in your body.  Depression.  Trouble sleeping (insomnia). Week 3 and afterward After the first 2-3 weeks of quitting, you may start to notice more positive results, such as:  Better sense of smell and taste.  Less coughing and sore throat.  Slower heart rate.  Lower blood pressure.  Clearer skin.  Better breathing.  Fewer sick days. Quitting smoking can be hard. Do not give up if you fail the first time. Some people need to try a few times before they succeed. Do your best to stick to your quit plan, and talk with your doctor if you have any questions or concerns. Summary  Smoking tobacco is the leading cause of preventable death. Quitting smoking can be hard, but it is one of the best things that you can do for your health.  When you decide to quit smoking, make a plan to help you succeed.  Quit smoking right away, not slowly over a period of time.  When you start quitting, seek help from your doctor, family, or friends. This information is not intended to replace advice given to you by your health care provider. Make sure you discuss any questions you have with your health care provider. Document Revised: 04/28/2019 Document Reviewed: 10/22/2018 Elsevier Patient Education  [place].

## 2020-05-07 NOTE — Telephone Encounter (Signed)
-----   Message from [name], [place] sent at 05/07/2020  3:19 PM EDT ----- Kidney function is back to normal now. However, potassium is borderline low. Can increase potassium rich foods in diet such as sweet potatoes, broccoli, bananas, beans, avocado, leafy greens like spinach and kale.

## 2020-05-08 ENCOUNTER — Telehealth: Payer: Self-pay

## 2020-05-08 DIAGNOSIS — M48062 Spinal stenosis, lumbar region with neurogenic claudication: Secondary | ICD-10-CM | POA: Diagnosis not present

## 2020-05-08 DIAGNOSIS — M5416 Radiculopathy, lumbar region: Secondary | ICD-10-CM | POA: Diagnosis not present

## 2020-05-08 DIAGNOSIS — M545 Low back pain: Secondary | ICD-10-CM | POA: Diagnosis not present

## 2020-05-08 NOTE — Progress Notes (Signed)
Patient not at home. Unable to go over medications. Will call back later

## 2020-05-16 DIAGNOSIS — K219 Gastro-esophageal reflux disease without esophagitis: Secondary | ICD-10-CM | POA: Diagnosis not present

## 2020-05-16 DIAGNOSIS — J310 Chronic rhinitis: Secondary | ICD-10-CM | POA: Diagnosis not present

## 2020-05-16 DIAGNOSIS — R070 Pain in throat: Secondary | ICD-10-CM | POA: Diagnosis not present

## 2020-05-17 ENCOUNTER — Other Ambulatory Visit: Payer: Self-pay | Admitting: Cardiovascular Disease

## 2020-05-17 ENCOUNTER — Other Ambulatory Visit: Payer: Self-pay | Admitting: Physician Assistant

## 2020-05-17 DIAGNOSIS — F331 Major depressive disorder, recurrent, moderate: Secondary | ICD-10-CM

## 2020-05-17 DIAGNOSIS — F5101 Primary insomnia: Secondary | ICD-10-CM

## 2020-05-17 DIAGNOSIS — J418 Mixed simple and mucopurulent chronic bronchitis: Secondary | ICD-10-CM

## 2020-05-17 DIAGNOSIS — F321 Major depressive disorder, single episode, moderate: Secondary | ICD-10-CM

## 2020-05-17 DIAGNOSIS — K219 Gastro-esophageal reflux disease without esophagitis: Secondary | ICD-10-CM

## 2020-05-17 DIAGNOSIS — F411 Generalized anxiety disorder: Secondary | ICD-10-CM

## 2020-05-17 DIAGNOSIS — R339 Retention of urine, unspecified: Secondary | ICD-10-CM

## 2020-05-17 MED ORDER — ATORVASTATIN CALCIUM 40 MG PO TABS
40.0000 mg | ORAL_TABLET | Freq: Every day | ORAL | 3 refills | Status: DC
Start: 2020-05-17 — End: 2020-05-17

## 2020-05-17 MED ORDER — DILTIAZEM HCL ER COATED BEADS 180 MG PO CP24: 180.0000 mg | ORAL_CAPSULE | Freq: Every day | ORAL | 3 refills | Status: DC

## 2020-05-17 MED ORDER — ARIPIPRAZOLE 10 MG PO TABS: 10.0000 mg | ORAL_TABLET | Freq: Every day | ORAL | 1 refills | Status: DC

## 2020-05-17 MED ORDER — BUSPIRONE HCL 30 MG PO TABS: 30.0000 mg | ORAL_TABLET | Freq: Three times a day (TID) | ORAL | 1 refills | Status: DC

## 2020-05-17 MED ORDER — IPRATROPIUM BROMIDE 0.02 % IN SOLN: 0.5000 mg | Freq: Four times a day (QID) | RESPIRATORY_TRACT | 5 refills | Status: DC | PRN

## 2020-05-17 MED ORDER — SERTRALINE HCL 100 MG PO TABS: 100.0000 mg | ORAL_TABLET | Freq: Every day | ORAL | 3 refills | Status: DC

## 2020-05-17 MED ORDER — EZETIMIBE 10 MG PO TABS
10.0000 mg | ORAL_TABLET | Freq: Every day | ORAL | 3 refills | Status: DC
Start: 2020-05-17 — End: 2020-05-17

## 2020-05-17 MED ORDER — TRELEGY ELLIPTA 100-62.5-25 MCG/INH IN AEPB: 1.0000 | INHALATION_SPRAY | Freq: Every day | RESPIRATORY_TRACT | 11 refills | Status: DC

## 2020-05-17 MED ORDER — TRAZODONE HCL 100 MG PO TABS: 300.0000 mg | ORAL_TABLET | Freq: Every day | ORAL | 3 refills | Status: DC

## 2020-05-17 MED ORDER — ATORVASTATIN CALCIUM 40 MG PO TABS: 40.0000 mg | ORAL_TABLET | Freq: Every day | ORAL | 3 refills | Status: DC

## 2020-05-17 MED ORDER — FUROSEMIDE 20 MG PO TABS: ORAL_TABLET | ORAL | 3 refills | Status: DC

## 2020-05-17 MED ORDER — VENLAFAXINE HCL ER 75 MG PO CP24: ORAL_CAPSULE | ORAL | 3 refills | Status: DC

## 2020-05-17 MED ORDER — EZETIMIBE 10 MG PO TABS: 10.0000 mg | ORAL_TABLET | Freq: Every day | ORAL | 3 refills | Status: DC

## 2020-05-17 MED ORDER — OMEPRAZOLE 40 MG PO CPDR: 40.0000 mg | DELAYED_RELEASE_CAPSULE | Freq: Two times a day (BID) | ORAL | 3 refills | Status: DC

## 2020-05-17 MED ORDER — POTASSIUM CHLORIDE CRYS ER 20 MEQ PO TBCR: 20.0000 meq | EXTENDED_RELEASE_TABLET | Freq: Every day | ORAL | 1 refills | Status: DC | PRN

## 2020-05-17 NOTE — Telephone Encounter (Signed)
*  STAT* If patient is at the pharmacy, call can be transferred to refill team.   1. Which medications need to be refilled? (please list name of each medication and dose if known)   All cardiac meds    2. Which pharmacy/location (including street and city if local pharmacy) is medication to be sent to?  [facility]    3. Do they need a 30 day or 90 day supply? 90  Having issues with [facility] please change and res[facility] to [facility]

## 2020-05-17 NOTE — Telephone Encounter (Signed)
Atorvastatin 40 mg, Ezetimibe 10 mg, and Diltiazem 180 mg all sent to [facility] for 90 days with 3 refills per pt request. Furosemide, Potassium, and Effexor are prescribed by PCP.

## 2020-05-21 ENCOUNTER — Telehealth: Payer: Self-pay | Admitting: *Deleted

## 2020-05-21 ENCOUNTER — Telehealth: Payer: Self-pay | Admitting: Student in an Organized Health Care Education/Training Program

## 2020-05-21 DIAGNOSIS — G629 Polyneuropathy, unspecified: Secondary | ICD-10-CM

## 2020-05-21 DIAGNOSIS — M47812 Spondylosis without myelopathy or radiculopathy, cervical region: Secondary | ICD-10-CM

## 2020-05-21 MED ORDER — TOPIRAMATE 25 MG PO TABS: 25.0000 mg | ORAL_TABLET | Freq: Two times a day (BID) | ORAL | 11 refills | Status: DC

## 2020-05-21 MED ORDER — GABAPENTIN 600 MG PO TABS: 600.0000 mg | ORAL_TABLET | Freq: Three times a day (TID) | ORAL | 2 refills | Status: DC

## 2020-05-21 NOTE — Telephone Encounter (Signed)
Elenora Fender, [facility], [employer] ,  states patient has transferred her gabapentin to them. They need to have scripts for Oct and Nov sent to them please. Phone [phone] Fax [phone]

## 2020-05-21 NOTE — Telephone Encounter (Signed)
Requested Prescriptions   Signed Prescriptions Disp Refills  . gabapentin (NEURONTIN) 600 MG tablet 270 tablet 2    Sig: Take 1 tablet (600 mg total) by mouth 3 (three) times daily.    Authorizing Provider: [name]  . topiramate (TOPAMAX) 25 MG tablet 60 tablet 11    Sig: Take 1 tablet (25 mg total) by mouth 2 (two) times daily.    Authorizing Provider: [name]

## 2020-05-21 NOTE — Telephone Encounter (Signed)
Called patient to confirm that she in fact needs these transferred.  She states that she has transferred her medications and [facility] was supposed to transfer the Rx to [facility].  I will f/up with [facility] to confirm what they need.

## 2020-05-22 NOTE — Telephone Encounter (Signed)
Called patient to let her know Rx have been sent.

## 2020-05-26 ENCOUNTER — Encounter: Payer: Self-pay | Admitting: Physician Assistant

## 2020-05-26 DIAGNOSIS — L509 Urticaria, unspecified: Secondary | ICD-10-CM

## 2020-05-27 MED ORDER — HYDROXYZINE HCL 10 MG PO TABS: 10.0000 mg | ORAL_TABLET | Freq: Three times a day (TID) | ORAL | 1 refills | Status: DC | PRN

## 2020-06-03 ENCOUNTER — Encounter: Payer: Self-pay | Admitting: Student in an Organized Health Care Education/Training Program

## 2020-06-03 ENCOUNTER — Ambulatory Visit
Admission: RE | Admit: 2020-06-03 | Discharge: 2020-06-03 | Disposition: A | Discharge status: Home / Self Care | Payer: PPO | Source: Ambulatory Visit | Attending: Student in an Organized Health Care Education/Training Program | Admitting: Student in an Organized Health Care Education/Training Program

## 2020-06-03 ENCOUNTER — Ambulatory Visit: Payer: PPO | Admitting: Student in an Organized Health Care Education/Training Program

## 2020-06-03 ENCOUNTER — Other Ambulatory Visit: Payer: Self-pay

## 2020-06-03 VITALS — BP 139/87 | HR 83 | Temp 97.2°F | Resp 18 | Ht 69.0 in | Wt 153.0 lb

## 2020-06-03 DIAGNOSIS — M5136 Other intervertebral disc degeneration, lumbar region: Secondary | ICD-10-CM | POA: Diagnosis not present

## 2020-06-03 DIAGNOSIS — G894 Chronic pain syndrome: Secondary | ICD-10-CM | POA: Diagnosis not present

## 2020-06-03 DIAGNOSIS — G8929 Other chronic pain: Secondary | ICD-10-CM | POA: Insufficient documentation

## 2020-06-03 DIAGNOSIS — M48062 Spinal stenosis, lumbar region with neurogenic claudication: Secondary | ICD-10-CM | POA: Insufficient documentation

## 2020-06-03 DIAGNOSIS — M5416 Radiculopathy, lumbar region: Secondary | ICD-10-CM | POA: Diagnosis not present

## 2020-06-03 DIAGNOSIS — M961 Postlaminectomy syndrome, not elsewhere classified: Secondary | ICD-10-CM

## 2020-06-03 NOTE — Progress Notes (Signed)
PROVIDER NOTE: Information contained herein reflects review and annotations entered in association with encounter. Interpretation of such information and data should be left to medically-trained personnel. Information provided to patient can be located elsewhere in the medical record under "Patient Instructions". Document created using STT-dictation technology, any transcriptional errors that may result from process are unintentional.    Patient: Gabrielle Pineda  Service Category: E/M  Provider: [name], MD  DOB: 08/28/1955  DOS: 06/03/2020  Specialty: Interventional Pain Management  MRN: [id number]  Setting: Ambulatory outpatient  PCP: [name], PA-C  Type: Established Patient    Referring Provider: Florian Buff*  Location: Office  Delivery: Face-to-face     HPI  Gabrielle Pineda, a 64 y.o. year old female, is here today because of her Lumbar radiculopathy [M54.16]. Gabrielle Pineda primary complain today is Neck Pain Last encounter: My last encounter with her was on 05/21/2020. Pertinent problems: Gabrielle Pineda does not have any pertinent problems on file. Pain Assessment: Severity of Chronic pain is reported as a 8 /10. Location: Neck  /both arms, worse on right side. Onset: More than a month ago. Quality: Constant, Other (Comment) (jabbing). Timing: Constant. Modifying factor(s): facet blocks. Vitals:  height is 5' 9"  (1.753 m) and weight is 153 lb (69.4 kg). Her temporal temperature is 97.2 F (36.2 C) (abnormal). Her blood pressure is 139/87 and her pulse is 83. Her respiration is 18 and oxygen saturation is 99%.   Reason for encounter: worsening of previously known (established) problem   Patient presents today to discuss spinal cord stimulation.  Of note she is status post lumbar spinal decompression without any hardware that was done in December 2020.  She endorses low back pain as well as bilateral leg pain that is worse on the right side.  We had an  extensive discussion regarding the risks and benefits of spinal cord stimulation including a trial which is what I would perform.  I was able to evaluate the patient's interlaminar windows under live fluoroscopy in her T12-L1 and L1-L2 interlaminar windows appear patent for a percutaneous trial.  Of note, patient has not had a follow-up MRI since her lumbar decompression surgery in 2020.  I would also like to obtain a thoracic MRI to rule out thoracic canal stenosis for spinal cord stimulator trial/implant planning.  I will also send the patient to psychology for spinal cord stimulator implant evaluation.   ROS  Constitutional: Denies any fever or chills Gastrointestinal: No reported hemesis, hematochezia, vomiting, or acute GI distress Musculoskeletal: Low back, bilateral leg pain, right greater than left Neurological: No reported episodes of acute onset apraxia, aphasia, dysarthria, agnosia, amnesia, paralysis, loss of coordination, or loss of consciousness  Medication Review  ARIPiprazole, Fluticasone-Umeclidin-Vilant, Guaifenesin, Lifitegrast, Misc Natural Products, atorvastatin, busPIRone, diltiazem, ezetimibe, fluticasone, furosemide, gabapentin, hydrOXYzine, ibuprofen, ipratropium, leflunomide, linaclotide, meloxicam, multivitamin with minerals, omeprazole, oxyCODONE, potassium chloride SA, sertraline, topiramate, traZODone, valACYclovir, venlafaxine XR, vitamin B-12, and vitamin E  History Review  Allergy: Gabrielle Pineda is allergic to plaquenil [hydroxychloroquine]. Drug: Gabrielle Pineda  reports no history of drug use. Alcohol:  reports no history of alcohol use. Tobacco:  reports that she has been smoking. She has a 40.00 pack-year smoking history. She has never used smokeless tobacco. Social: Gabrielle Pineda  reports that she has been smoking. She has a 40.00 pack-year smoking history. She has never used smokeless tobacco. She reports that she does not drink alcohol and does not use  drugs. Medical:  has a  past medical history of Alcohol abuse, Anemia, Anxiety, Arthritis, Cervicalgia, Cirrhosis ([place]) (1994), COPD (chronic obstructive pulmonary disease) ([place]), Depression, Dyspnea, GERD (gastroesophageal reflux disease), Headache, Heart murmur, Other and unspecified hyperlipidemia, and Tachycardia. Surgical: Gabrielle Pineda  has a past surgical history that includes neck disc surgery; Breast enhancement surgery (1981); Esophagogastroduodenoscopy; Tonsillectomy and adenoidectomy (1973 ); Carpal tunnel release (11/11/2011); Carpal tunnel release (2013); Rotator cuff repair (06/16/2016); Rotator cuff repair; Colonoscopy with propofol (N/A, 11/01/2017); Augmentation mammaplasty (Bilateral, 1982); Anterior cervical decomp/discectomy fusion (2012, 2015, 2018); and Lumbar laminectomy/decompression microdiscectomy (N/A, 08/15/2019). Family: family history includes Alcohol abuse in her father and mother; Bipolar disorder in her mother; Pancreatic cancer in her father; Suicidality in her mother.  Laboratory Chemistry Profile   Renal Lab Results  Component Value Date   BUN 9 05/06/2020   CREATININE 0.97 05/06/2020   BCR 9 (L) 05/06/2020   GFRAA 72 05/06/2020   GFRNONAA 62 05/06/2020     Hepatic Lab Results  Component Value Date   AST 28 04/19/2020   ALT 25 04/19/2020   ALBUMIN 4.2 05/06/2020   ALKPHOS 115 04/19/2020   LIPASE 63 03/23/2018     Electrolytes Lab Results  Component Value Date   NA 141 05/06/2020   K 3.3 (L) 05/06/2020   CL 112 (H) 05/06/2020   CALCIUM 9.1 05/06/2020   PHOS 3.2 05/06/2020     Bone No results found for: VD25OH, VD125OH2TOT, IW9798XQ1, JH4174YC1, 25OHVITD1, 25OHVITD2, 25OHVITD3, TESTOFREE, TESTOSTERONE   Inflammation (CRP: Acute Phase) (ESR: Chronic Phase) No results found for: CRP, ESRSEDRATE, LATICACIDVEN     Note: Above Lab results reviewed.   Physical Exam  General appearance: Well nourished, well developed, and well hydrated. In no  apparent acute distress Mental status: Alert, oriented x 3 (person, place, & time)       Respiratory: No evidence of acute respiratory distress Eyes: PERLA Vitals: BP 139/87   Pulse 83   Temp (!) 97.2 F (36.2 C) (Temporal)   Resp 18   Ht 5' 9"  (1.753 m)   Wt 153 lb (69.4 kg)   SpO2 99%   BMI 22.59 kg/m  BMI: Estimated body mass index is 22.59 kg/m as calculated from the following:   Height as of this encounter: 5' 9"  (1.753 m).   Weight as of this encounter: 153 lb (69.4 kg). Ideal: Ideal body weight: 66.2 kg (145 lb 15.1 oz) Adjusted ideal body weight: 67.5 kg (148 lb 12.3 oz)   Lumbar Spine Area Exam  Skin & Axial Inspection: Well healed scar from previous spine surgery detected Alignment: Symmetrical Functional ROM: Pain restricted ROM affecting both sides Stability: No instability detected Muscle Tone/Strength: Functionally intact. No obvious neuro-muscular anomalies detected. Sensory (Neurological): Dermatomal pain pattern Palpation: No palpable anomalies       Provocative Tests: Hyperextension/rotation test: deferred today       Lumbar quadrant test (Kemp's test): (+) bilateral for foraminal stenosis right greater than left Lateral bending test: (+) ipsilateral radicular pain, bilaterally. Positive for bilateral foraminal stenosis.  Right greater than left Patrick's Maneuver: deferred today                   FABER* test: deferred today                   S-I anterior distraction/compression test: deferred today         S-I lateral compression test: deferred today         S-I Thigh-thrust test: deferred today  S-I Gaenslen's test: deferred today         *(Flexion, ABduction and External Rotation) Gait & Posture Assessment  Ambulation: Limited Gait: Antalgic Posture: Difficulty standing up straight, due to pain  Lower Extremity Exam    Side: Right lower extremity  Side: Left lower extremity  Stability: No instability observed          Stability: No instability  observed          Skin & Extremity Inspection: Skin color, temperature, and hair growth are WNL. No peripheral edema or cyanosis. No masses, redness, swelling, asymmetry, or associated skin lesions. No contractures.  Skin & Extremity Inspection: Skin color, temperature, and hair growth are WNL. No peripheral edema or cyanosis. No masses, redness, swelling, asymmetry, or associated skin lesions. No contractures.  Functional ROM: Pain restricted ROM for hip and knee joints Limited SLR (straight leg raise)  Functional ROM: Pain restricted ROM for hip and knee joints Limited SLR (straight leg raise)  Muscle Tone/Strength: Functionally intact. No obvious neuro-muscular anomalies detected.  Muscle Tone/Strength: Functionally intact. No obvious neuro-muscular anomalies detected.  Sensory (Neurological): Dermatomal pain pattern        Sensory (Neurological): Dermatomal pain pattern        DTR: Patellar: deferred today Achilles: deferred today Plantar: deferred today  DTR: Patellar: deferred today Achilles: deferred today Plantar: deferred today  Palpation: No palpable anomalies  Palpation: No palpable anomalies    Assessment   Status Diagnosis  Worsening Worsening Worsening 1. Lumbar radiculopathy (R>L)   2. Chronic radicular lumbar pain   3. Spinal stenosis, lumbar region, with neurogenic claudication   4. Chronic pain syndrome   5. Other intervertebral disc degeneration, lumbar region   6. Failed back surgical syndrome   7. Postlaminectomy syndrome, lumbar region      Updated Problems: Problem  Failed Back Surgical Syndrome    Plan of Care   I discussed  percutaneous spinal cord stimulator trial with the patient in detail. I explained to the patient that they will have an external power source and programmer which the patient will use for 7 days. There will be daily communication with the stimulator company and the patient. A possible need for a mid-trial clinic visit to give the  patient the best chance of success.   Patient will need to have a thorough psychosocial behavioral evaluation. Our office will be happy to help facilitate this. Will place referral to Dr Lyman Speller.  Some of patient's pain does seem to be mechanical in nature, with some component of neurogenic pain as well. We discussed the indications for spinal cord stimulation, specifically stating that it is typically better for neuropathic and appendicular pain, but that we have had some success in the treatment of low back and hip pain.   Patient is interested in proceeding with spinal cord stimulation trial workup. She understands that this may not be successful, and that spinal cord stimulation in general is not a "magic bullet."   We had a lengthy and very detailed discussion of all the risks, benefits, alternatives, and rationale of surgery as well as the option of continuing nonsurgical therapies. We specifically discussed the risks of temporary or permanent worsened neurologic injury, no symptomatic relief or pain made worse after procedure, and also the need for future surgery (due to infection, CSF leak, bleeding, adjacent segment issues, bone-healing difficulties, and other related issues). No guarantees of outcome were made or implied and he is eager to proceed and presents for definitive treatment.  Pt  told me that all of her questions were answered thoroughly and to his satisfaction. Confidence and understanding of the discussed risks and consequences of  treatment was expressed and she accepted these risks and was eager to proceed with procedure.   Issues concerning treatment and diagnosis were discussed with the patient. There are no barriers to understanding the plan of treatment. Explanation was well received by patient and/or family who then verbalized understanding.    Plan: Spinal cord stimulator trial work-up to include lumbar MRI, thoracic MRI and psychological evaluation.  Patient will follow  up in 4 weeks after she has completed this to review and finalize a spinal cord stimulator trial date.  Orders:  Orders Placed This Encounter  Procedures  . DG PAIN CLINIC C-ARM 1-60 MIN NO REPORT    Intraoperative interpretation by procedural physician at [facility].    Standing Status:   Standing    Number of Occurrences:   1    Order Specific Question:   Reason for exam:    Answer:   Assistance in needle guidance and placement for procedures requiring needle placement in or near specific anatomical locations not easily accessible without such assistance.  . MR LUMBAR SPINE WO CONTRAST    Patient presents with axial pain with possible radicular component.  In addition to any acute findings, please report on:  1. Facet (Zygapophyseal) joint DJD (Hypertrophy, space narrowing, subchondral sclerosis, and/or osteophyte formation) 2. DDD and/or IVDD (Loss of disc height, desiccation or "Black disc disease") 3. Pars defects 4. Spondylolisthesis, spondylosis, and/or spondyloarthropathies (include Degree/Grade of displacement in mm) 5. Vertebral body Fractures, including age (old, new/acute) 61. Modic Type Changes 7. Demineralization 8. Bone pathology 9. Central, Lateral Recess, and/or Foraminal Stenosis (include AP diameter of stenosis in mm) 10. Surgical changes (hardware type, status, and presence of fibrosis)  NOTE: Please specify level(s) and laterality.    Standing Status:   Future    Standing Expiration Date:   09/03/2020    Order Specific Question:   What is the patient's sedation requirement?    Answer:   No Sedation    Order Specific Question:   Does the patient have a pacemaker or implanted devices?    Answer:   No    Order Specific Question:   Preferred imaging location?    Answer:   [facility] (table limit-350lbs)    Order Specific Question:   Call Results- Best Contact Number?    Answer:   [phone] ([facility])    Order Specific Question:    Radiology Contrast Protocol - do NOT remove file path    Answer:   \\[email or web address]  . MR THORACIC SPINE WO CONTRAST    Patient presents with axial pain with possible radicular component.  In addition to any acute findings, please report on:  1. Facet (Zygapophyseal) joint DJD (Hypertrophy, space narrowing, subchondral sclerosis, and/or osteophyte formation) 2. DDD and/or IVDD (Loss of disc height, desiccation or "Black disc disease") 3. Pars defects 4. Spondylolisthesis, spondylosis, and/or spondyloarthropathies (include Degree/Grade of displacement in mm) 5. Vertebral body Fractures, including age (old, new/acute) 79. Modic Type Changes 7. Demineralization 8. Bone pathology 9. Central, Lateral Recess, and/or Foraminal Stenosis (include AP diameter of stenosis in mm) 10. Surgical changes (hardware type, status, and presence of fibrosis) NOTE: Please specify level(s) and laterality.    Standing Status:   Future    Standing Expiration Date:   09/03/2020    Order Specific Question:   What is  the patient's sedation requirement?    Answer:   No Sedation    Order Specific Question:   Does the patient have a pacemaker or implanted devices?    Answer:   No    Order Specific Question:   Preferred imaging location?    Answer:   [facility] (table limit-350lbs)    Order Specific Question:   Call Results- Best Contact Number?    Answer:   [phone] ([facility])    Order Specific Question:   Radiology Contrast Protocol - do NOT remove file path    Answer:   \\[email or web address]  . Ambulatory referral to Psychology    Referral Priority:   Routine    Referral Type:   Psychiatric    Referral Reason:   Specialty Services Required    Referred to Provider:   Renaee Munda, PhD    Requested Specialty:   Psychology    Number of Visits Requested:   1   Follow-up plan:   Return in about 4 weeks (around 07/01/2020) for After  Imaging and Psych for SCS planning.   Recent Visits Date Type Provider Dept  03/18/20 Telemedicine [name], MD [facility]  Showing recent visits within past 90 days and meeting all other requirements Today's Visits Date Type Provider Dept  06/03/20 Office Visit [name], MD [facility]  Showing today's visits and meeting all other requirements Future Appointments No visits were found meeting these conditions. Showing future appointments within next 90 days and meeting all other requirements  I discussed the assessment and treatment plan with the patient. The patient was provided an opportunity to ask questions and all were answered. The patient agreed with the plan and demonstrated an understanding of the instructions.  Patient advised to call back or seek an in-person evaluation if the symptoms or condition worsens.  Duration of encounter:5mnutes.  Note by: BGillis Santa MD Date: 06/03/2020; Time: 2:14 PM

## 2020-06-03 NOTE — Patient Instructions (Signed)
You will complete psych referral, MRI X 2 and XRays as ordered prior to next appt. with pain clinic.

## 2020-06-03 NOTE — Progress Notes (Signed)
Safety precautions to be maintained throughout the outpatient stay will include: orient to surroundings, keep bed in low position, maintain call bell within reach at all times, provide assistance with transfer out of bed and ambulation.  

## 2020-06-11 DIAGNOSIS — M25551 Pain in right hip: Secondary | ICD-10-CM | POA: Diagnosis not present

## 2020-06-11 DIAGNOSIS — M545 Low back pain, unspecified: Secondary | ICD-10-CM | POA: Diagnosis not present

## 2020-06-11 DIAGNOSIS — M2539 Other instability, other specified joint: Secondary | ICD-10-CM | POA: Diagnosis not present

## 2020-06-11 DIAGNOSIS — M6281 Muscle weakness (generalized): Secondary | ICD-10-CM | POA: Diagnosis not present

## 2020-06-13 DIAGNOSIS — M2539 Other instability, other specified joint: Secondary | ICD-10-CM | POA: Diagnosis not present

## 2020-06-13 DIAGNOSIS — M25551 Pain in right hip: Secondary | ICD-10-CM | POA: Diagnosis not present

## 2020-06-13 DIAGNOSIS — K219 Gastro-esophageal reflux disease without esophagitis: Secondary | ICD-10-CM | POA: Diagnosis not present

## 2020-06-13 DIAGNOSIS — M6281 Muscle weakness (generalized): Secondary | ICD-10-CM | POA: Diagnosis not present

## 2020-06-13 DIAGNOSIS — M545 Low back pain, unspecified: Secondary | ICD-10-CM | POA: Diagnosis not present

## 2020-06-17 DIAGNOSIS — M25551 Pain in right hip: Secondary | ICD-10-CM | POA: Diagnosis not present

## 2020-06-17 DIAGNOSIS — M545 Low back pain, unspecified: Secondary | ICD-10-CM | POA: Diagnosis not present

## 2020-06-17 DIAGNOSIS — M2539 Other instability, other specified joint: Secondary | ICD-10-CM | POA: Diagnosis not present

## 2020-06-17 DIAGNOSIS — M6281 Muscle weakness (generalized): Secondary | ICD-10-CM | POA: Diagnosis not present

## 2020-06-19 ENCOUNTER — Telehealth: Payer: Self-pay | Admitting: *Deleted

## 2020-06-19 NOTE — Chronic Care Management (AMB) (Signed)
  Care Management   Note  06/19/2020 Name: Gabrielle Pineda MRN: [id number] DOB: [birth year]  Gabrielle Pineda is a 65 y.o. year old female who is a primary care patient of [facility] and is actively engaged with the care management team. I reached out to [name] by phone today to assist with canceling  a follow up visit with the Pharmacist.  Follow up plan: Unsuccessful telephone outreach attempt made. A HIPAA compliant phone message was left for the patient providing contact information and requesting a return call.  If patient returns call to provider office, please advise to call [facility] at [phone].  [facility]  Direct Dial: [phone]

## 2020-06-20 ENCOUNTER — Other Ambulatory Visit: Payer: Self-pay

## 2020-06-20 ENCOUNTER — Ambulatory Visit
Admission: RE | Admit: 2020-06-20 | Discharge: 2020-06-20 | Disposition: A | Discharge status: Home / Self Care | Payer: PPO | Source: Ambulatory Visit | Attending: Student in an Organized Health Care Education/Training Program | Admitting: Student in an Organized Health Care Education/Training Program

## 2020-06-20 DIAGNOSIS — G894 Chronic pain syndrome: Secondary | ICD-10-CM | POA: Insufficient documentation

## 2020-06-20 DIAGNOSIS — M545 Low back pain, unspecified: Secondary | ICD-10-CM | POA: Diagnosis not present

## 2020-06-20 DIAGNOSIS — M5416 Radiculopathy, lumbar region: Secondary | ICD-10-CM

## 2020-06-20 DIAGNOSIS — M5136 Other intervertebral disc degeneration, lumbar region: Secondary | ICD-10-CM

## 2020-06-20 DIAGNOSIS — M4804 Spinal stenosis, thoracic region: Secondary | ICD-10-CM | POA: Diagnosis not present

## 2020-06-20 DIAGNOSIS — M961 Postlaminectomy syndrome, not elsewhere classified: Secondary | ICD-10-CM | POA: Diagnosis not present

## 2020-06-20 DIAGNOSIS — G8929 Other chronic pain: Secondary | ICD-10-CM

## 2020-06-20 DIAGNOSIS — R200 Anesthesia of skin: Secondary | ICD-10-CM | POA: Diagnosis not present

## 2020-06-20 DIAGNOSIS — M48062 Spinal stenosis, lumbar region with neurogenic claudication: Secondary | ICD-10-CM | POA: Diagnosis not present

## 2020-06-20 DIAGNOSIS — M4314 Spondylolisthesis, thoracic region: Secondary | ICD-10-CM | POA: Diagnosis not present

## 2020-06-20 DIAGNOSIS — M5124 Other intervertebral disc displacement, thoracic region: Secondary | ICD-10-CM | POA: Diagnosis not present

## 2020-06-21 DIAGNOSIS — M6281 Muscle weakness (generalized): Secondary | ICD-10-CM | POA: Diagnosis not present

## 2020-06-21 DIAGNOSIS — M25551 Pain in right hip: Secondary | ICD-10-CM | POA: Diagnosis not present

## 2020-06-21 DIAGNOSIS — M545 Low back pain, unspecified: Secondary | ICD-10-CM | POA: Diagnosis not present

## 2020-06-21 DIAGNOSIS — M2539 Other instability, other specified joint: Secondary | ICD-10-CM | POA: Diagnosis not present

## 2020-06-25 DIAGNOSIS — M25551 Pain in right hip: Secondary | ICD-10-CM | POA: Diagnosis not present

## 2020-06-25 DIAGNOSIS — M6281 Muscle weakness (generalized): Secondary | ICD-10-CM | POA: Diagnosis not present

## 2020-06-25 DIAGNOSIS — M545 Low back pain, unspecified: Secondary | ICD-10-CM | POA: Diagnosis not present

## 2020-06-25 DIAGNOSIS — M2539 Other instability, other specified joint: Secondary | ICD-10-CM | POA: Diagnosis not present

## 2020-06-27 ENCOUNTER — Telehealth: Payer: Self-pay

## 2020-06-27 DIAGNOSIS — M2539 Other instability, other specified joint: Secondary | ICD-10-CM | POA: Diagnosis not present

## 2020-06-27 DIAGNOSIS — M25551 Pain in right hip: Secondary | ICD-10-CM | POA: Diagnosis not present

## 2020-06-27 DIAGNOSIS — M6281 Muscle weakness (generalized): Secondary | ICD-10-CM | POA: Diagnosis not present

## 2020-06-27 DIAGNOSIS — M545 Low back pain, unspecified: Secondary | ICD-10-CM | POA: Diagnosis not present

## 2020-06-27 NOTE — Progress Notes (Signed)
Chronic Care Management Pharmacy Assistant   Name: Gabrielle Pineda  MRN: [id number] DOB: 12-Jul-1956  Reason for Encounter: Medication Review     Gabrielle Pineda,  64 y.o. , female .  PCP : [name], PA-C  Allergies:   Allergies  Allergen Reactions  . Plaquenil [Hydroxychloroquine] Rash    Medications: Outpatient Encounter Medications as of 06/27/2020  Medication Sig  . ARIPiprazole (ABILIFY) 10 MG tablet Take 1 tablet (10 mg total) by mouth daily.  Marland Kitchen atorvastatin (LIPITOR) 40 MG tablet Take 1 tablet (40 mg total) by mouth daily.  . busPIRone (BUSPAR) 30 MG tablet Take 1 tablet (30 mg total) by mouth 3 (three) times daily.  Marland Kitchen diltiazem (CARDIZEM CD) 180 MG 24 hr capsule Take 1 capsule (180 mg total) by mouth daily.  Marland Kitchen ezetimibe (ZETIA) 10 MG tablet Take 1 tablet (10 mg total) by mouth daily.  . fluticasone (FLONASE) 50 MCG/ACT nasal spray Place 2 sprays into both nostrils daily.  . Fluticasone-Umeclidin-Vilant (TRELEGY ELLIPTA) 100-62.5-25 MCG/INH AEPB Inhale 1 puff into the lungs daily.  . furosemide (LASIX) 20 MG tablet TAKE 1 TABLET BY MOUTH ONCE DAILY AS NEEDED FOR FLUID  . gabapentin (NEURONTIN) 600 MG tablet Take 1 tablet (600 mg total) by mouth 3 (three) times daily.  . Guaifenesin (MUCINEX MAXIMUM STRENGTH) 1200 MG TB12 Take 1,200 mg by mouth daily.  . hydrOXYzine (ATARAX/VISTARIL) 10 MG tablet Take 1 tablet (10 mg total) by mouth 3 (three) times daily as needed (hives).  Marland Kitchen ibuprofen (ADVIL) 200 MG tablet Take 3 tablets (600 mg total) by mouth every 8 (eight) hours as needed. For pain  . ipratropium (ATROVENT) 0.02 % nebulizer solution Take 2.5 mLs (0.5 mg total) by nebulization 4 (four) times daily as needed for wheezing or shortness of breath.  . leflunomide (ARAVA) 10 MG tablet Take 10 mg by mouth at bedtime.   Marland Kitchen LINZESS 145 MCG CAPS capsule Take 145 mcg by mouth daily before breakfast.  . meloxicam (MOBIC) 15 MG tablet Take 1 tablet (15 mg total)  by mouth at bedtime.  . Multiple Vitamin (MULTIVITAMIN WITH MINERALS) TABS tablet Take 1 tablet by mouth daily.  . Nutritional Supplements (ESTROVEN ENERGY PO) Take 1 tablet by mouth daily.   Marland Kitchen omeprazole (PRILOSEC) 40 MG capsule Take 1 capsule (40 mg total) by mouth 2 (two) times daily.  Marland Kitchen oxyCODONE (ROXICODONE) 5 MG immediate release tablet Take 1 tablet (5 mg total) by mouth every 4 (four) hours as needed for severe pain.  . potassium chloride SA (KLOR-CON) 20 MEQ tablet Take 1 tablet (20 mEq total) by mouth daily as needed (take with furosemide (fluid retention)).  Marland Kitchen sertraline (ZOLOFT) 100 MG tablet Take 1 tablet (100 mg total) by mouth daily.  Marland Kitchen topiramate (TOPAMAX) 25 MG tablet Take 1 tablet (25 mg total) by mouth 2 (two) times daily.  . traZODone (DESYREL) 100 MG tablet Take 3 tablets (300 mg total) by mouth at bedtime.  . valACYclovir (VALTREX) 1000 MG tablet TAKE 2 TABLETS BY MOUTH TWICE DAILY AS NEEDED FOR 5 DAYS  . venlafaxine XR (EFFEXOR-XR) 75 MG 24 hr capsule TAKE 3 CAPSULES BY MOUTH ONCE DAILY WITH BREAKFAST  . vitamin B-12 (CYANOCOBALAMIN) 1000 MCG tablet Take 6,000 mcg by mouth daily.  . vitamin E 400 UNIT capsule Take 1,600 Units by mouth daily.  Marland Kitchen XIIDRA 5 % SOLN Place 1 drop into both eyes daily.  . [DISCONTINUED] sertraline (ZOLOFT) 100 MG tablet Take 1 tablet (100  mg total) by mouth daily.   No facility-administered encounter medications on file as of 06/27/2020.    Current Diagnosis: Patient Active Problem List   Diagnosis Date Noted  . Failed back surgical syndrome 06/03/2020  . Acute non-recurrent pansinusitis 03/12/2020  . Moderate episode of recurrent major depressive disorder ([place]) 12/04/2019  . Spondylosis of cervical region without myelopathy or radiculopathy 09/21/2019  . DDD (degenerative disc disease), cervical 09/21/2019  . Cervicalgia 09/21/2019  . Cervical fusion syndrome 09/21/2019  . Chronic pain syndrome 09/21/2019  . Degenerative lumbar spinal  stenosis 08/15/2019  . History of adenomatous polyp of colon 11/04/2017  . Cervical radiculopathy 08/02/2017  . Neuropathy 08/02/2017  . Essential hypertension 07/28/2017  . Closed compression fracture of L5 lumbar vertebra 07/07/2017  . Sacral insufficiency fracture with routine healing 07/07/2017  . COPD (chronic obstructive pulmonary disease) ([place]) 04/10/2015  . Family history of malignant neoplasm of pancreas 04/03/2015  . Hypercholesteremia 04/03/2015  . Disorder of iron metabolism 04/03/2015  . Weight loss 04/03/2015  . Delayed onset of urination 04/03/2015  . Acid reflux 10/11/2014  . Closed fracture of distal phalanx of thumb 08/15/2014  . Arthritis, degenerative 01/30/2014  . Arthritis or polyarthritis, rheumatoid ([place]) 01/30/2014  . Shortness of breath 12/22/2013  . SMOKER 09/22/2010  . Paroxysmal supraventricular tachycardia ([place]) 09/22/2010  . CHEST PAIN UNSPECIFIED 09/22/2010  . ELECTROCARDIOGRAM, ABNORMAL 09/22/2010  . B-complex deficiency 09/09/2007  . CN (constipation) 06/26/2007  . Clinical depression 06/26/2007  . Cold sore 06/26/2007  . H/O alcohol abuse 06/26/2007  . Cannot sleep 06/26/2007  . Localized osteoarthrosis, hand 06/26/2007  . Menopausal symptom 06/26/2007    Goals Addressed   None     Follow-Up:  Care Coordination with Outside Provider and Coordination of [place] office for refill of Meloxicam 15 mg Tablet

## 2020-07-01 DIAGNOSIS — M25475 Effusion, left foot: Secondary | ICD-10-CM | POA: Diagnosis not present

## 2020-07-01 DIAGNOSIS — M7989 Other specified soft tissue disorders: Secondary | ICD-10-CM | POA: Diagnosis not present

## 2020-07-01 DIAGNOSIS — S92322A Displaced fracture of second metatarsal bone, left foot, initial encounter for closed fracture: Secondary | ICD-10-CM | POA: Diagnosis not present

## 2020-07-01 DIAGNOSIS — M79672 Pain in left foot: Secondary | ICD-10-CM | POA: Diagnosis not present

## 2020-07-05 DIAGNOSIS — S92322A Displaced fracture of second metatarsal bone, left foot, initial encounter for closed fracture: Secondary | ICD-10-CM | POA: Diagnosis not present

## 2020-07-19 ENCOUNTER — Encounter: Payer: Self-pay | Admitting: Student in an Organized Health Care Education/Training Program

## 2020-07-23 ENCOUNTER — Telehealth: Payer: Self-pay

## 2020-07-23 NOTE — Progress Notes (Signed)
07/23/2020 LVM    Chronic Care Management Pharmacy Assistant   Name: Gabrielle Pineda  MRN: [id number] DOB: [birth year]  Reason for Encounter: Medication Review  Patient Questions:  1.  Have you seen any other providers since your last visit? Yes, Gabrielle Pineda (Internal Medicine)  and Gabrielle Pineda (Podiatry)  2.  Any changes in your medicines or health? No   PCP : [name], PA-C  Allergies:   Allergies  Allergen Reactions   Plaquenil [Hydroxychloroquine] Rash    Medications: Outpatient Encounter Medications as of 07/23/2020  Medication Sig   ARIPiprazole (ABILIFY) 10 MG tablet Take 1 tablet (10 mg total) by mouth daily.   atorvastatin (LIPITOR) 40 MG tablet Take 1 tablet (40 mg total) by mouth daily.   busPIRone (BUSPAR) 30 MG tablet Take 1 tablet (30 mg total) by mouth 3 (three) times daily.   diltiazem (CARDIZEM CD) 180 MG 24 hr capsule Take 1 capsule (180 mg total) by mouth daily.   ezetimibe (ZETIA) 10 MG tablet Take 1 tablet (10 mg total) by mouth daily.   fluticasone (FLONASE) 50 MCG/ACT nasal spray Place 2 sprays into both nostrils daily.   Fluticasone-Umeclidin-Vilant (TRELEGY ELLIPTA) 100-62.5-25 MCG/INH AEPB Inhale 1 puff into the lungs daily.   furosemide (LASIX) 20 MG tablet TAKE 1 TABLET BY MOUTH ONCE DAILY AS NEEDED FOR FLUID   gabapentin (NEURONTIN) 600 MG tablet Take 1 tablet (600 mg total) by mouth 3 (three) times daily.   Guaifenesin (MUCINEX MAXIMUM STRENGTH) 1200 MG TB12 Take 1,200 mg by mouth daily.   hydrOXYzine (ATARAX/VISTARIL) 10 MG tablet Take 1 tablet (10 mg total) by mouth 3 (three) times daily as needed (hives).   ibuprofen (ADVIL) 200 MG tablet Take 3 tablets (600 mg total) by mouth every 8 (eight) hours as needed. For pain   ipratropium (ATROVENT) 0.02 % nebulizer solution Take 2.5 mLs (0.5 mg total) by nebulization 4 (four) times daily as needed for wheezing or shortness of breath.   leflunomide (ARAVA) 10 MG tablet Take  10 mg by mouth at bedtime.    LINZESS 145 MCG CAPS capsule Take 145 mcg by mouth daily before breakfast.   meloxicam (MOBIC) 15 MG tablet Take 1 tablet (15 mg total) by mouth at bedtime.   Multiple Vitamin (MULTIVITAMIN WITH MINERALS) TABS tablet Take 1 tablet by mouth daily.   Nutritional Supplements (ESTROVEN ENERGY PO) Take 1 tablet by mouth daily.    omeprazole (PRILOSEC) 40 MG capsule Take 1 capsule (40 mg total) by mouth 2 (two) times daily.   oxyCODONE (ROXICODONE) 5 MG immediate release tablet Take 1 tablet (5 mg total) by mouth every 4 (four) hours as needed for severe pain.   potassium chloride SA (KLOR-CON) 20 MEQ tablet Take 1 tablet (20 mEq total) by mouth daily as needed (take with furosemide (fluid retention)).   sertraline (ZOLOFT) 100 MG tablet Take 1 tablet (100 mg total) by mouth daily.   topiramate (TOPAMAX) 25 MG tablet Take 1 tablet (25 mg total) by mouth 2 (two) times daily.   traZODone (DESYREL) 100 MG tablet Take 3 tablets (300 mg total) by mouth at bedtime.   valACYclovir (VALTREX) 1000 MG tablet TAKE 2 TABLETS BY MOUTH TWICE DAILY AS NEEDED FOR 5 DAYS   venlafaxine XR (EFFEXOR-XR) 75 MG 24 hr capsule TAKE 3 CAPSULES BY MOUTH ONCE DAILY WITH BREAKFAST   vitamin B-12 (CYANOCOBALAMIN) 1000 MCG tablet Take 6,000 mcg by mouth daily.   vitamin E 400 UNIT capsule Take 1,600 Units  by mouth daily.   XIIDRA 5 % SOLN Place 1 drop into both eyes daily.   [DISCONTINUED] sertraline (ZOLOFT) 100 MG tablet Take 1 tablet (100 mg total) by mouth daily.   No facility-administered encounter medications on file as of 07/23/2020.    Current Diagnosis: Patient Active Problem List   Diagnosis Date Noted   Failed back surgical syndrome 06/03/2020   Acute non-recurrent pansinusitis 03/12/2020   Moderate episode of recurrent major depressive disorder ([place]) 12/04/2019   Spondylosis of cervical region without myelopathy or radiculopathy 09/21/2019   DDD (degenerative  disc disease), cervical 09/21/2019   Cervicalgia 09/21/2019   Cervical fusion syndrome 09/21/2019   Chronic pain syndrome 09/21/2019   Degenerative lumbar spinal stenosis 08/15/2019   History of adenomatous polyp of colon 11/04/2017   Cervical radiculopathy 08/02/2017   Neuropathy 08/02/2017   Essential hypertension 07/28/2017   Closed compression fracture of L5 lumbar vertebra 07/07/2017   Sacral insufficiency fracture with routine healing 07/07/2017   COPD (chronic obstructive pulmonary disease) ([place]) 04/10/2015   Family history of malignant neoplasm of pancreas 04/03/2015   Hypercholesteremia 04/03/2015   Disorder of iron metabolism 04/03/2015   Weight loss 04/03/2015   Delayed onset of urination 04/03/2015   Acid reflux 10/11/2014   Closed fracture of distal phalanx of thumb 08/15/2014   Arthritis, degenerative 01/30/2014   Arthritis or polyarthritis, rheumatoid ([place]) 01/30/2014   Shortness of breath 12/22/2013   SMOKER 09/22/2010   Paroxysmal supraventricular tachycardia ([place]) 09/22/2010   CHEST PAIN UNSPECIFIED 09/22/2010   ELECTROCARDIOGRAM, ABNORMAL 09/22/2010   B-complex deficiency 09/09/2007   CN (constipation) 06/26/2007   Clinical depression 06/26/2007   Cold sore 06/26/2007   H/O alcohol abuse 06/26/2007   Cannot sleep 06/26/2007   Localized osteoarthrosis, hand 06/26/2007   Menopausal symptom 06/26/2007    Goals Addressed   None     Follow-Up:  Coordination of Enhanced Pharmacy Services and Pharmacist Review  Reviewed chart for medication changes ahead of medication coordination call.  No OVs, Consults, or hospital visits since last care coordination call/Pharmacist visit. (If appropriate, list visit date, provider name)  No medication changes indicated OR if recent visit, treatment plan here.  BP Readings from Last 3 Encounters:  06/03/20 139/87  04/19/20 127/79  12/18/19 130/90    Lab Results  Component Value Date    HGBA1C 5.0 04/14/2018     Patient obtains medications through Adherence Packaging  30 Days   This is pt's first adherence delivery.  Patient is due for next adherence delivery on: 07/30/2020. Called patient and reviewed medications and coordinated delivery.  This delivery to include: Packs  Meloxicam 15 mg tablet- Take one tablet by mouth once daily (bedtime) refill requested  Leflunomide 10 mg tablet- Take one tablet by mouth once daily (bedtime)  Ezetimibe 10 mg tablet Take one tablet by mouth daily (breakfast)  Sertraline 100 mg tablet- Take one tablet by mouth daily (breakfast)  Atorvastatin 40 mg tablet- Take one tablet by mouth daily (breakfast)  Trazodone 100 mg tablet Take three tablets by mouth everyday (bedtime)  Buspirone 20 mg tablet- Take one tablet by mouth three times daily (breakfast, lunch, bedtime)  Diltiazem Cd 180 mg capsule ER- Take one tablet by mouth daily (breakfast)  Gabapentin 600 mg tablet- Take one tablet by mouth three times daily (breakfast, lunch bedtime)  Topiramate 25 mg tablet-Take one tablet by mouth twice daily (breakfast, bedtime)   Aripiprazole 10 mg tablet Take one tablet by mouth daily (breakfast)  Venlafaxine ER 74  mg capsule- Take three capsules by mouth daily (breakfast)  Omeprazole 40 my- Take one capsule by mouth two times a day (breakfast, bedtime)  Linzess 145 mcg- Take one tablet by mouth daily (before breakfast)  Vials  Trelegy Aer 100 mcg Ellipta- Inhale 1 puff by mouth into lungs daily (breakfast)  Patient declined the following medications   Ipratropium Bromide 0.02 solution- Inhale 2.44mls via nebulizer four times daily (PRN)  Hydroxyzine Hcl 10 mg tablet- Take one table by mouth three times daily (PRN)  Furosemide20 mg tablet-Take one tablet by mouth once daily (PRN)  Potassium Cl Er 20 Meq tablet- Take one tablet by mouth daily (take with furosemide) (prn) Patient needs refills for meloxicam 15  tablet. Confirmed delivery date of 07/30/2020, advised patient that pharmacy will contact them the morning of delivery.  Pt's recent home blood pressure's reported as :  07/24/2020   138/92 07/22/2020   128/79 She is not keeping a log at this time and doesn't remember any blood pressures prior to 07/22/2020.

## 2020-07-23 NOTE — Chronic Care Management (AMB) (Signed)
  Care Management   Note  07/23/2020 Name: MAYCEE BLASCO MRN: [id number] DOB: 11-08-1955  Adair Patter Luba is a 64 y.o. year old female who is a primary care patient of Rubye Beach and is actively engaged with the care management team. I reached out to [name] by phone today to assist with re-scheduling a follow up visit with the Pharmacist  Follow up plan: Telephone appointment with care management team member scheduled for:08/23/2020  [place] [facility]  Direct Dial [phone].

## 2020-07-26 DIAGNOSIS — S92332D Displaced fracture of third metatarsal bone, left foot, subsequent encounter for fracture with routine healing: Secondary | ICD-10-CM | POA: Diagnosis not present

## 2020-07-26 DIAGNOSIS — S92322D Displaced fracture of second metatarsal bone, left foot, subsequent encounter for fracture with routine healing: Secondary | ICD-10-CM | POA: Diagnosis not present

## 2020-07-30 DIAGNOSIS — M8949 Other hypertrophic osteoarthropathy, multiple sites: Secondary | ICD-10-CM | POA: Diagnosis not present

## 2020-07-30 DIAGNOSIS — G8929 Other chronic pain: Secondary | ICD-10-CM | POA: Diagnosis not present

## 2020-07-30 DIAGNOSIS — M0579 Rheumatoid arthritis with rheumatoid factor of multiple sites without organ or systems involvement: Secondary | ICD-10-CM | POA: Diagnosis not present

## 2020-07-30 DIAGNOSIS — M542 Cervicalgia: Secondary | ICD-10-CM | POA: Diagnosis not present

## 2020-07-30 DIAGNOSIS — M545 Low back pain, unspecified: Secondary | ICD-10-CM | POA: Diagnosis not present

## 2020-08-05 ENCOUNTER — Telehealth: Payer: Self-pay

## 2020-08-19 DIAGNOSIS — M2041 Other hammer toe(s) (acquired), right foot: Secondary | ICD-10-CM | POA: Diagnosis not present

## 2020-08-19 DIAGNOSIS — S92332D Displaced fracture of third metatarsal bone, left foot, subsequent encounter for fracture with routine healing: Secondary | ICD-10-CM | POA: Diagnosis not present

## 2020-08-19 DIAGNOSIS — S92322D Displaced fracture of second metatarsal bone, left foot, subsequent encounter for fracture with routine healing: Secondary | ICD-10-CM | POA: Diagnosis not present

## 2020-08-19 DIAGNOSIS — M79672 Pain in left foot: Secondary | ICD-10-CM | POA: Diagnosis not present

## 2020-08-19 DIAGNOSIS — Z87891 Personal history of nicotine dependence: Secondary | ICD-10-CM | POA: Diagnosis not present

## 2020-08-19 DIAGNOSIS — M25475 Effusion, left foot: Secondary | ICD-10-CM | POA: Diagnosis not present

## 2020-08-19 DIAGNOSIS — M21622 Bunionette of left foot: Secondary | ICD-10-CM | POA: Diagnosis not present

## 2020-08-19 DIAGNOSIS — M2042 Other hammer toe(s) (acquired), left foot: Secondary | ICD-10-CM | POA: Diagnosis not present

## 2020-08-19 DIAGNOSIS — R262 Difficulty in walking, not elsewhere classified: Secondary | ICD-10-CM | POA: Diagnosis not present

## 2020-08-22 ENCOUNTER — Telehealth: Payer: Self-pay

## 2020-08-22 NOTE — Chronic Care Management (AMB) (Signed)
08/22/2020- Called patient to remind of appointment with Angelena Sole, CPP on 08/23/20 at 830 am. No answer,left message of appointment details.   Billee Cashing, CMA Clinical Pharmacist Assistant [phone]

## 2020-08-23 ENCOUNTER — Ambulatory Visit: Payer: PPO

## 2020-08-23 ENCOUNTER — Other Ambulatory Visit: Payer: Self-pay | Admitting: Physician Assistant

## 2020-08-23 DIAGNOSIS — I10 Essential (primary) hypertension: Secondary | ICD-10-CM

## 2020-08-23 DIAGNOSIS — B001 Herpesviral vesicular dermatitis: Secondary | ICD-10-CM

## 2020-08-23 DIAGNOSIS — E7800 Pure hypercholesterolemia, unspecified: Secondary | ICD-10-CM

## 2020-08-23 MED ORDER — VALACYCLOVIR HCL 1 G PO TABS: ORAL_TABLET | ORAL | 11 refills | Status: DC

## 2020-08-23 MED ORDER — ACYCLOVIR 5 % EX OINT: 1.0000 "application " | TOPICAL_OINTMENT | CUTANEOUS | 3 refills | Status: DC

## 2020-08-23 NOTE — Chronic Care Management (AMB) (Signed)
Chronic Care Management Pharmacy  Name: Gabrielle Pineda  MRN: [id number] DOB: [birth year]  Chief Complaint/ HPI  Gabrielle Pineda,  65 y.o. , female presents for their Follow-Up CCM visit with the clinical pharmacist via telephone.  PCP : [name], PA-C  Their chronic conditions include: Hypertension, Hyperlipidemia, Atrial Fibrillation, COPD, Depression, Tobacco use, and Chronic Pain, Rheumatoid Arthritis  Office Visits: 9/3 AWV, [place], BP 127/79 P 73 Wt 152, exercise 3-4 qwk, trouble urinating, push fluids, mucopurulent bronchitis  Consult Visit: 07/30/20: Patient presented to Dr. Jefm Bryant (Rheumatology) for follow-up. Discussed Humara, but patient hesitant because of cost. 8/2 DDD, [place], Cirrhosis, tachycardia,  Anoro Ellipta, active smoker, Topamax, gabapentin for neuropathic pain  Medications: Outpatient Encounter Medications as of 08/23/2020  Medication Sig  . ARIPiprazole (ABILIFY) 10 MG tablet Take 1 tablet (10 mg total) by mouth daily.  Marland Kitchen atorvastatin (LIPITOR) 40 MG tablet Take 1 tablet (40 mg total) by mouth daily.  . busPIRone (BUSPAR) 30 MG tablet Take 1 tablet (30 mg total) by mouth 3 (three) times daily.  Marland Kitchen diltiazem (CARDIZEM CD) 180 MG 24 hr capsule Take 1 capsule (180 mg total) by mouth daily.  Marland Kitchen ezetimibe (ZETIA) 10 MG tablet Take 1 tablet (10 mg total) by mouth daily.  . fluticasone (FLONASE) 50 MCG/ACT nasal spray Place 2 sprays into both nostrils daily.  . Fluticasone-Umeclidin-Vilant (TRELEGY ELLIPTA) 100-62.5-25 MCG/INH AEPB Inhale 1 puff into the lungs daily.  . furosemide (LASIX) 20 MG tablet TAKE 1 TABLET BY MOUTH ONCE DAILY AS NEEDED FOR FLUID  . gabapentin (NEURONTIN) 600 MG tablet Take 1 tablet (600 mg total) by mouth 3 (three) times daily.  . Guaifenesin (MUCINEX MAXIMUM STRENGTH) 1200 MG TB12 Take 1,200 mg by mouth daily.  . hydrOXYzine (ATARAX/VISTARIL) 10 MG tablet Take 1 tablet (10 mg total) by mouth 3 (three) times daily as  needed (hives).  Marland Kitchen ibuprofen (ADVIL) 200 MG tablet Take 3 tablets (600 mg total) by mouth every 8 (eight) hours as needed. For pain  . ipratropium (ATROVENT) 0.02 % nebulizer solution Take 2.5 mLs (0.5 mg total) by nebulization 4 (four) times daily as needed for wheezing or shortness of breath.  . leflunomide (ARAVA) 10 MG tablet Take 10 mg by mouth at bedtime.   Marland Kitchen LINZESS 145 MCG CAPS capsule Take 145 mcg by mouth daily before breakfast.  . meloxicam (MOBIC) 15 MG tablet Take 1 tablet (15 mg total) by mouth at bedtime.  . Multiple Vitamin (MULTIVITAMIN WITH MINERALS) TABS tablet Take 1 tablet by mouth daily.  . Nutritional Supplements (ESTROVEN ENERGY PO) Take 1 tablet by mouth daily.   Marland Kitchen omeprazole (PRILOSEC) 40 MG capsule Take 1 capsule (40 mg total) by mouth 2 (two) times daily.  Marland Kitchen oxyCODONE (ROXICODONE) 5 MG immediate release tablet Take 1 tablet (5 mg total) by mouth every 4 (four) hours as needed for severe pain.  . potassium chloride SA (KLOR-CON) 20 MEQ tablet Take 1 tablet (20 mEq total) by mouth daily as needed (take with furosemide (fluid retention)).  Marland Kitchen sertraline (ZOLOFT) 100 MG tablet Take 1 tablet (100 mg total) by mouth daily.  Marland Kitchen topiramate (TOPAMAX) 25 MG tablet Take 1 tablet (25 mg total) by mouth 2 (two) times daily.  . traZODone (DESYREL) 100 MG tablet Take 3 tablets (300 mg total) by mouth at bedtime.  . valACYclovir (VALTREX) 1000 MG tablet TAKE 2 TABLETS BY MOUTH TWICE DAILY AS NEEDED FOR 5 DAYS  . venlafaxine XR (EFFEXOR-XR) 75 MG 24 hr  capsule TAKE 3 CAPSULES BY MOUTH ONCE DAILY WITH BREAKFAST  . vitamin B-12 (CYANOCOBALAMIN) 1000 MCG tablet Take 6,000 mcg by mouth daily.  . vitamin E 400 UNIT capsule Take 1,600 Units by mouth daily.  Marland Kitchen XIIDRA 5 % SOLN Place 1 drop into both eyes daily.   No facility-administered encounter medications on file as of 08/23/2020.   Current Diagnosis/Assessment:  SDOH Interventions   Flowsheet Row Most Recent Value  SDOH Interventions    Financial Strain Interventions Intervention Not Indicated  Transportation Interventions Intervention Not Indicated     Goals Addressed            This Visit's Progress   . Chronic Care Management       CARE PLAN ENTRY (see longitudinal plan of care for additional care plan information)  Current Barriers:  . Chronic Disease Management support, education, and care coordination needs related to Hypertension, Hyperlipidemia, Atrial Fibrillation, COPD, Depression, Tobacco use, and Chronic Pain, Rheumatoid Arthritis   Hypertension BP Readings from Last 3 Encounters:  04/19/20 127/79  12/18/19 130/90  12/04/19 134/83   . Pharmacist Clinical Goal(s): o Over the next 90 days, patient will work with PharmD and providers to maintain BP goal <140/90 . Current regimen:  o Diltiazem CD 138m daily . Interventions: o None . Patient self care activities - Over the next 90 days, patient will: o Check BP weekly, document, and provide at future appointments o Ensure daily salt intake < 2300 mg/day  Hyperlipidemia Lab Results  Component Value Date/Time   LDLCALC 98 04/19/2020 10:26 AM   . Pharmacist Clinical Goal(s): o Over the next 90 days, patient will work with PharmD and providers to maintain LDL goal < 100 . Current regimen:  o Lipitor 437mdaily o Zetia 1059maily . Interventions: o None . Patient self care activities - Over the next 90 days, patient will: o Continue to take atorvastatin and ezetimibe every day  Tobacco Abuse . Pharmacist Clinical Goal(s) o Over the next 90 days, patient will work with PharmD and providers to stop smoking . Current regimen:  o Approximately 20 cigarettes daily . Interventions: o [place] QuitLine o 1800QUITNOW o Recommended 40m45mtch and nicotine gum 4mg 48matient self care activities - Over the next 90 days, patient will: o Call the [place] [facility] when and participate in counseling  Medication management . Pharmacist Clinical  Goal(s): o Over the next 90 days, patient will work with PharmD and providers to maintain optimal medication adherence . Current pharmacy: [facility] . Interventions o Comprehensive medication review performed. o Utilize [facility] pharmacy for medication synchronization, packaging and delivery . Patient self care activities - Over the next 90 days, patient will: o Take medications as prescribed o Report any questions or concerns to PharmD and/or provider(s)      Hypertension   BP goal is:  <140/90  Office blood pressures are  BP Readings from Last 3 Encounters:  06/03/20 139/87  04/19/20 127/79  12/18/19 130/90   Patient checks BP at home 1-2x per week Patient home BP readings are ranging:NA  Patient has failed these meds in the past: NA Patient is currently controlled on the following medications:  . Diltiazem CD 180mg 65my . Furosemide 20 mg daily PRN   We discussed: blood pressure has been stable, denies tachycardia, arrhthymias, or chest pain. Also denies dizziness or hypotension   Plan  Continue current medications   Hyperlipidemia   LDL goal < 100  Lipid Panel  Component Value Date/Time   CHOL 172 04/19/2020 1026   TRIG 102 04/19/2020 1026   HDL 56 04/19/2020 1026   LDLCALC 98 04/19/2020 1026    Hepatic Function Latest Ref Rng & Units 05/06/2020 04/19/2020 08/09/2019  Total Protein 6.0 - 8.5 g/dL - 6.4 6.3(L)  Albumin 3.8 - 4.8 g/dL 4.2 4.3 4.0  AST 0 - 40 IU/L - 28 24  ALT 0 - 32 IU/L - 25 19  Alk Phosphatase 48 - 121 IU/L - 115 101  Total Bilirubin 0.0 - 1.2 mg/dL - <0.2 0.3  Bilirubin, Direct 0.00 - 0.40 mg/dL - - -     The 10-year ASCVD risk score Mikey Bussing DC Jr., et al., 2013) is: 12%   Values used to calculate the score:     Age: 65 years     Sex: Female     Is Non-Hispanic African American: No     Diabetic: No     Tobacco smoker: Yes     Systolic Blood Pressure: 263 mmHg     Is BP treated: Yes     HDL Cholesterol: 56 mg/dL     Total  Cholesterol: 172 mg/dL   Patient has failed these meds in past: NA Patient is currently controlled on the following medications:  . Atorvastatin 51m daily . Ezetimibe 128mdaily  We discussed:  At goal Denies myalgias  Plan  Continue current medications  COPD   Eosinophil count:  No results found for: EOSPCT%                               Eos (Absolute):  Lab Results  Component Value Date/Time   EOSABS 0.2 04/19/2020 10:26 AM    Tobacco Status:  Social History   Tobacco Use  Smoking Status Current Every Day Smoker  . Packs/day: 1.00  . Years: 40.00  . Pack years: 40.00  Smokeless Tobacco Never Used    Patient has failed these meds in past: NA Patient is currently controlled on the following medications:   . Trelegy 1 puff daily   Using maintenance inhaler regularly? Yes Frequency of rescue inhaler use:  never  We discussed:  proper inhaler technique. PAP for 2022 is in process.   Plan  Continue current medications   Tobacco Abuse   Tobacco Status:  Social History   Tobacco Use  Smoking Status Current Every Day Smoker  . Packs/day: 1.00  . Years: 40.00  . Pack years: 40.00  Smokeless Tobacco Never Used   On a scale of 1-10, reports MOTIVATION to quit is 5 On a scale of 1-10, reports CONFIDENCE in quitting is 5  Previous quit attempts included: 2 Patient is currently uncontrolled on the following medications:  . None  We discussed:  Provided contact information for [place] Quit Line (1-800-QUIT-NOW) and encouraged patient to reach out to this group for support.  Trelegy superior to Anoro No rescue inhaler Uses Atrovent SVN twice daily, sometimes once Quit 1.5 years ago 20 cigarettes daily  Plan   Increase Flonase use Call [place] Quitline when ready Continue current medications  Depression / Anxiety   PHQ9 Score:  PHQ9 SCORE ONLY 08/23/2020 06/03/2020 04/19/2020  PHQ-9 Total Score 0 0 0   GAD7 Score: GAD 7 : Generalized Anxiety Score 12/04/2019   Nervous, Anxious, on Edge 1  Control/stop worrying 2  Worry too much - different things 2  Trouble relaxing 1  Restless 3  Easily annoyed  or irritable 0  Afraid - awful might happen 0  Total GAD 7 Score 9  Anxiety Difficulty Not difficult at all    Patient has failed these meds in past: NA Patient is currently controlled on the following medications:  . Aripiprazole 10 mg daily  . Buspirone 30 mg 1 tablet 3 times daily  . Sertraline 100 mg daily  . Venlafaxine XR 75 mg daily   We discussed:  Mood much improved and stable since   Plan  Continue current medications  If patient continues to be stable, recommend stopping venlafaxine or sertraline due to overlap in MOA and increased risk of serotonin syndrome.   Insomnia   Patient has failed these meds in past: NA Patient is currently uncontrolled on the following medications:  . Trazodone 100 mg 3 tabs QHS  We discussed:  No troubles falling asleep, but restless throughout the night. She feels it has improved somewhat with increased trazodone. When she wakes up, she will turn on the TV. Counseled patient to avoid bright lights or electronic lights in the evening, and recommended she try a white noise machine or music instead.   Plan  Continue current medications  Medication Management   Reviewed chart for medication changes ahead of medication coordination call.  BP Readings from Last 3 Encounters:  06/03/20 139/87  04/19/20 127/79  12/18/19 130/90    Lab Results  Component Value Date   HGBA1C 5.0 04/14/2018     Patient obtains medications through Adherence Packaging  30 Days   Patient is due for next adherence delivery on: 09/04/19. Called patient and reviewed medications and coordinated delivery.  This delivery to include: . Aripiprazole 10 mg tablet Take one tablet by mouth daily (breakfast) . Atorvastatin 40 mg tablet- Take one tablet by mouth daily (breakfast) . Buspirone 30 mg tablet- Take one tablet by  mouth three times daily (breakfast, lunch, bedtime) . Diltiazem Cd 180 mg capsule ER- Take one tablet by mouth daily (breakfast) . Ezetimibe 10 mg tablet Take one tablet by mouth daily (breakfast) . Gabapentin 600 mg tablet- Take one tablet by mouth three times daily (breakfast, lunch bedtime) . Leflunomide 10 mg tablet- Take one tablet by mouth once daily (bedtime) . Linzess 145 mcg- Take one tablet by mouth daily (before breakfast) . Meloxicam 15 mg tablet- Take one tablet by mouth once daily (bedtime)  . Omeprazole 40 my- Take one capsule by mouth two times a day (breakfast, bedtime) . Sertraline 100 mg tablet- Take one tablet by mouth daily (bedtime) . Topiramate 25 mg tablet-Take one tablet by mouth twice daily (breakfast, bedtime)  . Trazodone 100 mg tablet Take three tablets by mouth everyday (bedtime) . Venlafaxine ER 75 mg capsule- Take three capsules by mouth daily (breakfast) Patient requests an acute fill for facial blisters:  Valacyclovir 1g 2 tablets by mouth twice daily for 5 days  Acyclovir 5% cream  Patient declined the following medications:  . Ipratropium Bromide 0.02 solution- Inhale 2.73ms via nebulizer four times daily (PRN) . Hydroxyzine Hcl 10 mg tablet- Take one table by mouth three times daily (PRN) . Furosemide20 mg tablet-Take one tablet by mouth once daily (PRN) . Potassium Cl Er 20 Meq tablet- Take one tablet by mouth daily (take with furosemide) (prn) . Trelegy - Adequate Supply   Patient needs refills for none ID'd.  Confirmed delivery date of 09/04/19, advised patient that pharmacy will contact them the morning of delivery.  Follow up: 3 month phone visit  ADoristine Section  [place] [phone]

## 2020-08-23 NOTE — Patient Instructions (Signed)
Visit Information It was great speaking with you today!  Please let me know if you have any questions about our visit. Goals Addressed            This Visit's Progress   . Chronic Care Management       CARE PLAN ENTRY (see longitudinal plan of care for additional care plan information)  Current Barriers:  . Chronic Disease Management support, education, and care coordination needs related to Hypertension, Hyperlipidemia, Atrial Fibrillation, COPD, Depression, Tobacco use, and Chronic Pain, Rheumatoid Arthritis   Hypertension BP Readings from Last 3 Encounters:  04/19/20 127/79  12/18/19 130/90  12/04/19 134/83   . Pharmacist Clinical Goal(s): o Over the next 90 days, patient will work with PharmD and providers to maintain BP goal <140/90 . Current regimen:  o Diltiazem CD 180mg  daily . Interventions: o None . Patient self care activities - Over the next 90 days, patient will: o Check BP weekly, document, and provide at future appointments o Ensure daily salt intake < 2300 mg/day  Hyperlipidemia Lab Results  Component Value Date/Time   LDLCALC 98 04/19/2020 10:26 AM   . Pharmacist Clinical Goal(s): o Over the next 90 days, patient will work with PharmD and providers to maintain LDL goal < 100 . Current regimen:  o Lipitor 40mg  daily o Zetia 10mg  daily . Interventions: o None . Patient self care activities - Over the next 90 days, patient will: o Continue to take atorvastatin and ezetimibe every day  Tobacco Abuse . Pharmacist Clinical Goal(s) o Over the next 90 days, patient will work with PharmD and providers to stop smoking . Current regimen:  o Approximately 20 cigarettes daily . Interventions: o [place] QuitLine o [id number] o Recommended 21mg  patch and nicotine gum 4mg  . Patient self care activities - Over the next 90 days, patient will: o Call the [facility] Quitline when and participate in counseling  Medication management . Pharmacist Clinical Goal(s): o Over  the next 90 days, patient will work with PharmD and providers to maintain optimal medication adherence . Current pharmacy: [facility] . Interventions o Comprehensive medication review performed. o Utilize [facility] pharmacy for medication synchronization, packaging and delivery . Patient self care activities - Over the next 90 days, patient will: o Take medications as prescribed o Report any questions or concerns to PharmD and/or provider(s)       The patient verbalized understanding of instructions, educational materials, and care plan provided today and declined offer to receive copy of patient instructions, educational materials, and care plan.   Telephone follow up appointment with pharmacy team member scheduled for: 11/25/20 at 1:00 PM  [place] [phone]

## 2020-08-23 NOTE — Progress Notes (Signed)
Valtrex sent to [facility].

## 2020-08-30 ENCOUNTER — Other Ambulatory Visit: Payer: Self-pay | Admitting: Physician Assistant

## 2020-08-30 DIAGNOSIS — J418 Mixed simple and mucopurulent chronic bronchitis: Secondary | ICD-10-CM

## 2020-08-30 MED ORDER — TRELEGY ELLIPTA 100-62.5-25 MCG/INH IN AEPB: 1.0000 | INHALATION_SPRAY | Freq: Every day | RESPIRATORY_TRACT | 11 refills | Status: DC

## 2020-08-30 NOTE — Progress Notes (Signed)
Trelegy printed

## 2020-09-04 DIAGNOSIS — H524 Presbyopia: Secondary | ICD-10-CM | POA: Diagnosis not present

## 2020-09-04 DIAGNOSIS — H5203 Hypermetropia, bilateral: Secondary | ICD-10-CM | POA: Diagnosis not present

## 2020-09-04 DIAGNOSIS — H04123 Dry eye syndrome of bilateral lacrimal glands: Secondary | ICD-10-CM | POA: Diagnosis not present

## 2020-09-09 DIAGNOSIS — G894 Chronic pain syndrome: Secondary | ICD-10-CM | POA: Diagnosis not present

## 2020-09-09 DIAGNOSIS — F17200 Nicotine dependence, unspecified, uncomplicated: Secondary | ICD-10-CM | POA: Diagnosis not present

## 2020-09-09 DIAGNOSIS — M5116 Intervertebral disc disorders with radiculopathy, lumbar region: Secondary | ICD-10-CM | POA: Diagnosis not present

## 2020-09-17 ENCOUNTER — Ambulatory Visit (INDEPENDENT_AMBULATORY_CARE_PROVIDER_SITE_OTHER): Payer: PPO

## 2020-09-17 DIAGNOSIS — J418 Mixed simple and mucopurulent chronic bronchitis: Secondary | ICD-10-CM

## 2020-09-17 DIAGNOSIS — S92332D Displaced fracture of third metatarsal bone, left foot, subsequent encounter for fracture with routine healing: Secondary | ICD-10-CM | POA: Diagnosis not present

## 2020-09-17 DIAGNOSIS — S92342A Displaced fracture of fourth metatarsal bone, left foot, initial encounter for closed fracture: Secondary | ICD-10-CM | POA: Diagnosis not present

## 2020-09-17 DIAGNOSIS — S92322D Displaced fracture of second metatarsal bone, left foot, subsequent encounter for fracture with routine healing: Secondary | ICD-10-CM | POA: Diagnosis not present

## 2020-09-17 NOTE — Chronic Care Management (AMB) (Signed)
[place] Patient Assistance form for Trelegy completed by patient and faxed for review on 09/17/20   [place] [phone]

## 2020-09-26 ENCOUNTER — Telehealth: Payer: Self-pay

## 2020-09-26 NOTE — Progress Notes (Addendum)
Chronic Care Management Pharmacy Assistant   Name: Gabrielle Pineda  MRN: [id number] DOB: 1955/10/01  Reason for Encounter: Medication Review  Patient Questions:  1.  Have you seen any other providers since your last visit? No  2.  Any changes in your medicines or health? No   PCP : [name], PA-C  Allergies:   Allergies  Allergen Reactions   Plaquenil [Hydroxychloroquine] Rash    Medications: Outpatient Encounter Medications as of 09/26/2020  Medication Sig   acyclovir ointment (ZOVIRAX) 5 % Apply 1 application topically every 3 (three) hours.   ARIPiprazole (ABILIFY) 10 MG tablet Take 1 tablet (10 mg total) by mouth daily.   atorvastatin (LIPITOR) 40 MG tablet Take 1 tablet (40 mg total) by mouth daily.   busPIRone (BUSPAR) 30 MG tablet Take 1 tablet (30 mg total) by mouth 3 (three) times daily.   diltiazem (CARDIZEM CD) 180 MG 24 hr capsule Take 1 capsule (180 mg total) by mouth daily.   ezetimibe (ZETIA) 10 MG tablet Take 1 tablet (10 mg total) by mouth daily.   fluticasone (FLONASE) 50 MCG/ACT nasal spray Place 2 sprays into both nostrils daily.   Fluticasone-Umeclidin-Vilant (TRELEGY ELLIPTA) 100-62.5-25 MCG/INH AEPB Inhale 1 puff into the lungs daily.   furosemide (LASIX) 20 MG tablet TAKE 1 TABLET BY MOUTH ONCE DAILY AS NEEDED FOR FLUID   gabapentin (NEURONTIN) 600 MG tablet Take 1 tablet (600 mg total) by mouth 3 (three) times daily.   Guaifenesin (MUCINEX MAXIMUM STRENGTH) 1200 MG TB12 Take 1,200 mg by mouth daily.   hydrOXYzine (ATARAX/VISTARIL) 10 MG tablet Take 1 tablet (10 mg total) by mouth 3 (three) times daily as needed (hives).   ibuprofen (ADVIL) 200 MG tablet Take 3 tablets (600 mg total) by mouth every 8 (eight) hours as needed. For pain   ipratropium (ATROVENT) 0.02 % nebulizer solution Take 2.5 mLs (0.5 mg total) by nebulization 4 (four) times daily as needed for wheezing or shortness of breath.   leflunomide (ARAVA) 10 MG tablet Take 10 mg  by mouth at bedtime.    LINZESS 145 MCG CAPS capsule Take 145 mcg by mouth daily before breakfast.   meloxicam (MOBIC) 15 MG tablet Take 1 tablet (15 mg total) by mouth at bedtime.   Multiple Vitamin (MULTIVITAMIN WITH MINERALS) TABS tablet Take 1 tablet by mouth daily.   Nutritional Supplements (ESTROVEN ENERGY PO) Take 1 tablet by mouth daily.    omeprazole (PRILOSEC) 40 MG capsule Take 1 capsule (40 mg total) by mouth 2 (two) times daily.   oxyCODONE (ROXICODONE) 5 MG immediate release tablet Take 1 tablet (5 mg total) by mouth every 4 (four) hours as needed for severe pain.   potassium chloride SA (KLOR-CON) 20 MEQ tablet Take 1 tablet (20 mEq total) by mouth daily as needed (take with furosemide (fluid retention)).   sertraline (ZOLOFT) 100 MG tablet Take 1 tablet (100 mg total) by mouth daily.   topiramate (TOPAMAX) 25 MG tablet Take 1 tablet (25 mg total) by mouth 2 (two) times daily.   traZODone (DESYREL) 100 MG tablet Take 3 tablets (300 mg total) by mouth at bedtime.   valACYclovir (VALTREX) 1000 MG tablet TAKE 2 TABLETS BY MOUTH TWICE DAILY AS NEEDED FOR 5 DAYS   venlafaxine XR (EFFEXOR-XR) 75 MG 24 hr capsule TAKE 3 CAPSULES BY MOUTH ONCE DAILY WITH BREAKFAST   vitamin B-12 (CYANOCOBALAMIN) 1000 MCG tablet Take 6,000 mcg by mouth daily.   vitamin E 400 UNIT capsule  Take 1,600 Units by mouth daily.   XIIDRA 5 % SOLN Place 1 drop into both eyes daily.   No facility-administered encounter medications on file as of 09/26/2020.    Current Diagnosis: Patient Active Problem List   Diagnosis Date Noted   Failed back surgical syndrome 06/03/2020   Acute non-recurrent pansinusitis 03/12/2020   Moderate episode of recurrent major depressive disorder ([place]) 12/04/2019   Spondylosis of cervical region without myelopathy or radiculopathy 09/21/2019   DDD (degenerative disc disease), cervical 09/21/2019   Cervicalgia 09/21/2019   Cervical fusion syndrome 09/21/2019   Chronic pain syndrome  09/21/2019   Degenerative lumbar spinal stenosis 08/15/2019   History of adenomatous polyp of colon 11/04/2017   Cervical radiculopathy 08/02/2017   Neuropathy 08/02/2017   Essential hypertension 07/28/2017   Closed compression fracture of L5 lumbar vertebra 07/07/2017   Sacral insufficiency fracture with routine healing 07/07/2017   COPD (chronic obstructive pulmonary disease) ([place]) 04/10/2015   Family history of malignant neoplasm of pancreas 04/03/2015   Hypercholesteremia 04/03/2015   Disorder of iron metabolism 04/03/2015   Weight loss 04/03/2015   Delayed onset of urination 04/03/2015   Acid reflux 10/11/2014   Closed fracture of distal phalanx of thumb 08/15/2014   Arthritis, degenerative 01/30/2014   Arthritis or polyarthritis, rheumatoid ([place]) 01/30/2014   Shortness of breath 12/22/2013   SMOKER 09/22/2010   Paroxysmal supraventricular tachycardia ([place]) 09/22/2010   CHEST PAIN UNSPECIFIED 09/22/2010   ELECTROCARDIOGRAM, ABNORMAL 09/22/2010   B-complex deficiency 09/09/2007   CN (constipation) 06/26/2007   Clinical depression 06/26/2007   Cold sore 06/26/2007   H/O alcohol abuse 06/26/2007   Cannot sleep 06/26/2007   Localized osteoarthrosis, hand 06/26/2007   Menopausal symptom 06/26/2007    Goals Addressed   None    Reviewed chart for medication changes ahead of medication coordination call.  No OVs, Consults, or hospital visits since last care coordination call/Pharmacist visit.   No medication changes indicated   BP Readings from Last 3 Encounters:  06/03/20 139/87  04/19/20 127/79  12/18/19 130/90    Lab Results  Component Value Date   HGBA1C 5.0 04/14/2018     Patient obtains medications through Adherence Packaging  30 Days   Last adherence delivery included:  Aripiprazole 10 mg tablet Take one tablet by mouth daily (breakfast) Atorvastatin 40 mg tablet- Take one tablet by mouth daily (breakfast) Buspirone 30 mg tablet- Take one tablet by mouth  three times daily (breakfast, lunch, bedtime) Diltiazem Cd 180 mg capsule ER- Take one tablet by mouth daily (breakfast) Ezetimibe 10 mg tablet Take one tablet by mouth daily (breakfast) Gabapentin 600 mg tablet- Take one tablet by mouth three times daily (breakfast, lunch bedtime) Leflunomide 10 mg tablet- Take one tablet by mouth once daily (bedtime) Linzess 145 mcg- Take one tablet by mouth daily (before breakfast) Meloxicam 15 mg tablet- Take one tablet by mouth once daily (bedtime)  Omeprazole 40 my- Take one capsule by mouth two times a day (breakfast, bedtime) Sertraline 100 mg tablet- Take one tablet by mouth daily (bedtime) Topiramate 25 mg tablet-Take one tablet by mouth twice daily (breakfast, bedtime)  Trazodone 100 mg tablet Take three tablets by mouth everyday (bedtime) Venlafaxine ER 75 mg capsule- Take three capsules by mouth daily (breakfast Patient requests an acute fill for facial blisters:             Valacyclovir 1g 2 tablets by mouth twice daily for 5 days  Acyclovir 5% cream  Patient declined medication last month  Ipratropium Bromide 0.02 solution- Inhale 2.32mls via nebulizer four times daily (PRN) Hydroxyzine Hcl 10 mg tablet- Take one table by mouth three times daily (PRN) Furosemide20 mg tablet-Take one tablet by mouth once daily (PRN) Potassium Cl Er 20 Meq tablet- Take one tablet by mouth daily (take with furosemide) (prn) Trelegy - Adequate Supply   Patient is due for next adherence delivery on: 10/03/2020. Called patient and reviewed medications and coordinated delivery.  This delivery to include:  Aripiprazole 10 mg tablet Take one tablet by mouth daily (breakfast) Atorvastatin 40 mg tablet- Take one tablet by mouth daily (breakfast) Buspirone 30 mg tablet- Take one tablet by mouth three times daily (breakfast, lunch, bedtime) Diltiazem Cd 180 mg capsule ER- Take one tablet by mouth daily (breakfast) Ezetimibe 10 mg tablet Take one tablet by  mouth daily (breakfast) Gabapentin 600 mg tablet- Take one tablet by mouth three times daily (breakfast, lunch bedtime) Leflunomide 10 mg tablet- Take one tablet by mouth once daily (bedtime) Linzess 145 mcg- Take one tablet by mouth daily (before breakfast) Meloxicam 15 mg tablet- Take one tablet by mouth once daily (bedtime)  Omeprazole 40 my- Take one capsule by mouth two times a day (breakfast, bedtime) Sertraline 100 mg tablet- Take one tablet by mouth daily (bedtime) Topiramate 25 mg tablet-Take one tablet by mouth twice daily (breakfast, bedtime)  Trazodone 100 mg tablet Take three tablets by mouth everyday (bedtime) Venlafaxine ER 75 mg capsule- Take three capsules by mouth daily (breakfast  Patient will not need a short fill of medication, prior to adherence delivery.   Coordinated acute fill for Trelegy 114mcg   to be delivered on 09/30/2020.  Patient declined the following medications: Ipratropium Bromide 0.02 solution- Inhale 2.55mls via nebulizer four times daily (PRN) Hydroxyzine Hcl 10 mg tablet- Take one table by mouth three times daily (PRN) Furosemide20 mg tablet-Take one tablet by mouth once daily (PRN) Potassium Cl Er 20 Meq tablet- Take one tablet by mouth daily (take with furosemide) (prn)   Valacyclovir 1g 2 tablets by mouth twice daily for 5 days Acyclovir 5% cream - Adequate supply  Patient needs refills for None ID .  Confirmed delivery date of 10/03/2020, advised patient that pharmacy will contact them the morning of delivery.  Blood pressure readings: Patient states her blood pressure has been ranging around 129/79  Follow-Up:  Pharmacist Review   [place]essie [place] Pharmacist Assistant [phone]

## 2020-10-03 ENCOUNTER — Encounter: Payer: Self-pay | Admitting: Student in an Organized Health Care Education/Training Program

## 2020-10-03 ENCOUNTER — Other Ambulatory Visit: Payer: Self-pay

## 2020-10-03 ENCOUNTER — Ambulatory Visit
Payer: PPO | Attending: Student in an Organized Health Care Education/Training Program | Admitting: Student in an Organized Health Care Education/Training Program

## 2020-10-03 DIAGNOSIS — G894 Chronic pain syndrome: Secondary | ICD-10-CM

## 2020-10-03 DIAGNOSIS — M5416 Radiculopathy, lumbar region: Secondary | ICD-10-CM

## 2020-10-03 DIAGNOSIS — M961 Postlaminectomy syndrome, not elsewhere classified: Secondary | ICD-10-CM | POA: Diagnosis not present

## 2020-10-03 DIAGNOSIS — G8929 Other chronic pain: Secondary | ICD-10-CM | POA: Diagnosis not present

## 2020-10-03 DIAGNOSIS — M48062 Spinal stenosis, lumbar region with neurogenic claudication: Secondary | ICD-10-CM

## 2020-10-03 NOTE — Progress Notes (Signed)
Patient: Gabrielle Pineda  Service Category: E/M  Provider: [name], MD  DOB: [birth year]  DOS: 10/03/2020  Location: Office  MRN: [id number]  Setting: Ambulatory outpatient  Referring Provider: Florian Pineda*  Type: Established Patient  Specialty: Interventional Pain Management  PCP: Gabrielle Daring, PA-C  Location: Home  Delivery: TeleHealth     Virtual Encounter - Pain Management PROVIDER NOTE: Information contained herein reflects review and annotations entered in association with encounter. Interpretation of such information and data should be left to medically-trained personnel. Information provided to patient can be located elsewhere in the medical record under "Patient Instructions". Document created using STT-dictation technology, any transcriptional errors that may result from process are unintentional.    Contact & Pharmacy Preferred: [facility]: [phone] (home) Mobile: [phone] (mobile) E-mail: [email or web address]  [facility] - [place], [place] - [address] [place] [address] Phone: [phone] Fax: [phone]   Pre-screening  Gabrielle Pineda offered "in-person" vs "virtual" encounter. She indicated preferring virtual for this encounter.   Reason COVID-19*  Social distancing based on CDC and AMA recommendations.   I contacted Gabrielle Pineda on 10/03/2020 via video conference.      I clearly identified myself as Gabrielle Santa, MD. I verified that I was speaking with the correct person using two identifiers (Name: Gabrielle Pineda, and date of birth: 10-07-1955).  Consent I sought verbal advanced consent from Gabrielle Pineda for virtual visit interactions. I informed Gabrielle Pineda of possible security and privacy concerns, risks, and limitations associated with providing "not-in-person" medical evaluation and management services. I also informed Gabrielle Pineda of the availability  of "in-person" appointments. Finally, I informed her that there would be a charge for the virtual visit and that she could be  personally, fully or partially, financially responsible for it. Gabrielle Pineda expressed understanding and agreed to proceed.   Historic Elements   Gabrielle Pineda is a 65 y.o. year old, female patient evaluated today after our last contact on 06/03/2020. Gabrielle Pineda  has a past medical history of Alcohol abuse, Anemia, Anxiety, Arthritis, Cervicalgia, Cirrhosis ([place]) (1994), COPD (chronic obstructive pulmonary disease) ([place]), Depression, Dyspnea, GERD (gastroesophageal reflux disease), Headache, Heart murmur, Other and unspecified hyperlipidemia, and Tachycardia. She also  has a past surgical history that includes neck disc surgery; Breast enhancement surgery (1981); Esophagogastroduodenoscopy; Tonsillectomy and adenoidectomy (1973 ); Carpal tunnel release (11/11/2011); Carpal tunnel release (2013); Rotator cuff repair (06/16/2016); Rotator cuff repair; Colonoscopy with propofol (N/A, 11/01/2017); Augmentation mammaplasty (Bilateral, 1982); Anterior cervical decomp/discectomy fusion (2012, 2015, 2018); and Lumbar laminectomy/decompression microdiscectomy (N/A, 08/15/2019). Gabrielle Pineda has a current medication list which includes the following prescription(s): acyclovir ointment, aripiprazole, atorvastatin, buspirone, diltiazem, ezetimibe, fluticasone, trelegy ellipta, furosemide, gabapentin, guaifenesin, hydroxyzine, ibuprofen, ipratropium, leflunomide, linzess, meloxicam, multivitamin with minerals, misc natural products, omeprazole, oxycodone, potassium chloride sa, sertraline, topiramate, trazodone, valacyclovir, venlafaxine xr, vitamin b-12, vitamin e, and xiidra. She  reports that she has been smoking. She has a 40.00 pack-year smoking history. She has never used smokeless tobacco. She reports that she does not drink alcohol and does not use drugs. Gabrielle Pineda is allergic  to plaquenil [hydroxychloroquine].   HPI  Today, she is being contacted for follow-up evaluation  Gabrielle Pineda up today virtually after her spinal cord stimulator trial work-up.  She continues to endorse bilateral low back pain that radiates to bilateral hips and then down her legs right greater than left.  She has had  previous lumbar epidural steroid injections as well as lumbar facet medial branch nerve blocks with limited benefit.  She is currently on gabapentin for her pain.  She does smoke a pack per day which we have counseled her on.  She has had a thoracic and lumbar MRI and does not have any thoracic canal stenosis that would preclude a safe spinal cord stimulator trial.  She does have a history of postlaminectomy pain syndrome, failed back surgical syndrome along with persistent lumbar radicular pain and lower extremity neuropathic pain which I think would benefit from neuromodulation.  We had an extensive discussion regarding risks and benefits of spinal cord stimulation.  Please see previous notes.  Please see psychological assessment from Dr. Lyman Speller.  I will place an order for a [employer] spinal cord stimulator trial.   Laboratory Chemistry Profile   Renal Lab Results  Component Value Date   BUN 9 05/06/2020   CREATININE 0.97 05/06/2020   BCR 9 (L) 05/06/2020   GFRAA 72 05/06/2020   GFRNONAA 62 05/06/2020     Hepatic Lab Results  Component Value Date   AST 28 04/19/2020   ALT 25 04/19/2020   ALBUMIN 4.2 05/06/2020   ALKPHOS 115 04/19/2020   LIPASE 63 03/23/2018     Electrolytes Lab Results  Component Value Date   NA 141 05/06/2020   K 3.3 (L) 05/06/2020   CL 112 (H) 05/06/2020   CALCIUM 9.1 05/06/2020   PHOS 3.2 05/06/2020     Bone No results found for: VD25OH, VD125OH2TOT, HY3888LN7, VJ2820UO1, 25OHVITD1, 25OHVITD2, 25OHVITD3, TESTOFREE, TESTOSTERONE   Inflammation (CRP: Acute Phase) (ESR: Chronic Phase) No results found for: CRP, ESRSEDRATE,  LATICACIDVEN     Note: Above Lab results reviewed.  Imaging  MR LUMBAR SPINE WO CONTRAST CLINICAL DATA:  Osteoarthritis, lumbosacral radiculopathy. Greater than 6 weeks of low back pain.  EXAM: MRI LUMBAR SPINE WITHOUT CONTRAST  TECHNIQUE: Multiplanar, multisequence MR imaging of the lumbar spine was performed. No intravenous contrast was administered.  COMPARISON:  MRI lumbar spine 04/15/2019  FINDINGS: Segmentation: The inferior-most fully formed intervertebral disc is labeled L5-S1, concordant with prior MRI.  Alignment:  No substantial subluxation.  Vertebrae: No evidence of acute fracture, discitis/osteomyelitis, or suspicious bone lesion. Mild degenerative/discogenic endplate signal changes about the L5-S1 disc.  Conus medullaris and cauda equina: Conus extends to the inferior L2 level. Conus appears normal.  Paraspinal and other soft tissues: Unremarkable  Disc levels:  T12-L1: Mild disc bulge without significant canal or foraminal stenosis.  L1-L2: Mild disc bulge without significant canal or foraminal stenosis.  L2-L3: Mild disc bulge without significant canal or foraminal stenosis.  L3-L4: Status post posterior decompression without significant residual canal stenosis. There is similar broad-based disc bulge with small central disc protrusion. Bilateral facet hypertrophy. Mild bilateral subarticular recess stenosis. No significant foraminal stenosis.  L4-L5: Status post posterior decompression with improved canal stenosis, now mild. Broad-based disc bulge and bilateral facet hypertrophy with severe bilateral subarticular recess stenosis. There are two 5-6 mm synovial cysts along the ventral margin of the left facet joint. The more ventral cyst does appear to contact the descending left L5 nerve root without evidence of compression. Mild bilateral foraminal stenosis.  L5-S1: Status post posterior decompression without significant canal stenosis.  Disc desiccation and height loss. Broad-based disc bulge and bilateral facet hypertrophy. Mild to moderate left greater than right subarticular recess stenosis. Moderate to severe bilateral foraminal stenosis, similar to prior.  IMPRESSION: 1. Status post posterior decompression spanning L3-S1.  Improved canal stenosis with only mild residual canal stenosis at L4-L5. There is severe bilateral subarticular recess stenosis at L4-L5 with possible impingement of the descending L5 nerve roots. 2. Moderate to severe bilateral foraminal stenosis at L5-S1, similar to prior. 3. There are two 5-6 mm cysts along the ventral left facet joint at L4-L5 with the more ventral contacting but not compressing the descending left L5 nerve root. 4. Mild to moderate left greater than right subarticular recess stenosis at L5-S1.  Electronically Signed   By: Margaretha Sheffield MD   On: 06/20/2020 10:21 MR THORACIC SPINE WO CONTRAST CLINICAL DATA:  65 year old female with mid and upper back pain greater on the right. Right arm pain and occasional numbness. Progressed chronic symptoms. Workup for spinal stimulator. Prior cervical spine surgery.  EXAM: MRI THORACIC SPINE WITHOUT CONTRAST  TECHNIQUE: Multiplanar, multisequence MR imaging of the thoracic spine was performed. No intravenous contrast was administered.  COMPARISON:  CT cervical and thoracic myelogram 03/10/2016.  FINDINGS: Limited cervical spine imaging: Previous C3 through C6 ACDF with hardware susceptibility artifact. Cervical vertebral height and alignment appears stable. Cervicothoracic junction alignment maintained.  Thoracic spine segmentation: Normal on the comparison CT myelogram. Same numbering system used today.  Alignment: Stable thoracic kyphosis. Minor superimposed dextroconvex thoracic scoliosis. There is mild anterolisthesis at T2-T3 which has increased from 2017.  Vertebrae: No marrow edema or evidence of acute osseous  abnormality. Background bone marrow signal within normal limits.  Cord: Normal. Capacious thoracic spinal canal at most levels, occasional spinal stenosis detailed below. The conus medullaris occurs below the visible T12-L1 level.  Paraspinal and other soft tissues: Negative.  Disc levels:  T1-T2: Mild to moderate facet and ligament flavum hypertrophy greater on the right. No spinal stenosis. Mild right T1 foraminal stenosis.  T2-T3: Mild anterolisthesis. Disc space loss and broad-based central disc bulge or protrusion. Mild to moderate facet and ligament flavum hypertrophy. Mild spinal stenosis (series 22, image 8). Mild left and moderate right T2 foraminal stenosis.  T3-T4: Minor disc bulge. Mild to moderate facet hypertrophy on the right. No stenosis.  T4-T5: Disc space loss with probable vacuum disc. Mild circumferential disc bulge. Mild right facet hypertrophy. No stenosis.  T5-T6: Disc space loss with circumferential disc bulge. Broad-based posterior component eccentric to the left (series 22, image 17). No stenosis.  T6-T7: Disc space loss. Small left paracentral disc bulge or protrusion (series 22, image 20). Mild left facet hypertrophy. No stenosis.  T7-T8: Broad-based central disc bulge or protrusion (series 22, image 23). Mild to moderate facet hypertrophy on the left. Moderate left T7 foraminal stenosis.  T8-T9: Small to moderate central disc protrusion or extrusion with evidence of annular fissure (series 23, image 26). Chronic mass effect on the ventral cord (series 22, image 26) although the dorsal CSF space is patent. Mild spinal stenosis overall.  T9-T10: Disc space loss with mildly lobulated disc bulging. Mild to moderate facet and ligament flavum hypertrophy greater on the right. Borderline to mild spinal stenosis.  T10-T11: Negative.  T11-T12: Disc space loss with circumferential disc bulge eccentric to the right. Broad-based right paracentral  component (series 22, image 35). Mild facet and ligament flavum hypertrophy. No significant spinal stenosis. Mild right T11 foraminal stenosis.  T12-L1: Minor disc bulging and ligament flavum hypertrophy. No stenosis.  IMPRESSION: 1. No acute osseous abnormality in the thoracic spine. Mild anterolisthesis at T2-T3 appears increased from 2017.  2. Widespread thoracic disc and posterior element degeneration. Up to mild spinal stenosis at T2-T3, T8-T9,  and T9-T10. Stable chronic ventral cord mass effect at T8-T9. No cord signal abnormality identified. Up to moderate neural foraminal stenosis at the right T2 and left T7 nerve levels.  Electronically Signed   By: Genevie Ann M.D.   On: 06/20/2020 09:47  Assessment  The primary encounter diagnosis was Failed back surgical syndrome. Diagnoses of Postlaminectomy syndrome, lumbar region, Lumbar radiculopathy (R>L), Chronic radicular lumbar pain, Spinal stenosis, lumbar region, with neurogenic claudication, and Chronic pain syndrome were also pertinent to this visit.  Plan of Care   Ms. [place] has a current medication list which includes the following long-term medication(s): aripiprazole, atorvastatin, diltiazem, ezetimibe, fluticasone, furosemide, gabapentin, ipratropium, linzess, omeprazole, potassium chloride sa, sertraline, topiramate, trazodone, and venlafaxine xr.  I discussed  percutaneous spinal cord stimulator trial with the patient in detail. I explained to the patient that they will have an external power source and programmer which the patient will use for 7 days. There will be daily communication with the stimulator company and the patient. A possible need for a mid-trial clinic visit to give the patient the best chance of success.   Patient has completed a thorough psychosocial behavioral evaluation with Dr Lyman Speller and has been cleared from a psych standpoint.  Some of patient's pain does seem to be mechanical in nature,  with some component of neurogenic pain as well. We discussed the indications for spinal cord stimulation, specifically stating that it is typically better for neuropathic and appendicular pain, but that we have had some success in the treatment of low back and hip pain.   Patient is interested in proceeding with spinal cord stimulation trial. She understands that this may not be successful, and that spinal cord stimulation in general is not a "magic bullet."   We had a lengthy and very detailed discussion of all the risks, benefits, alternatives, and rationale of surgery as well as the option of continuing nonsurgical therapies. We specifically discussed the risks of temporary or permanent worsened neurologic injury, no symptomatic relief or pain made worse after procedure, and also the need for future surgery (due to infection, CSF leak, bleeding, adjacent segment issues, bone-healing difficulties, and other related issues). No guarantees of outcome were made or implied and he is eager to proceed and presents for definitive treatment.   Artrice told me that all of her questions were answered thoroughly and to her satisfaction. Confidence and understanding of the discussed risks and consequences of  treatment was expressed and she accepted these risks and was eager to proceed with procedure.   Issues concerning treatment and diagnosis were discussed with the patient. There are no barriers to understanding the plan of treatment. Explanation was well received by patient and/or family who then verbalized understanding.   Plan for [facility] spinal cord stimulator trial for failed back surgical syndrome, postlaminectomy pain syndrome. Orders:  Orders Placed This Encounter  Procedures  . [facility] representative to notify them of the scheduled case and to make sure they will be available to provide required equipment.    Standing Status:   Future    Standing  Expiration Date:   04/02/2021    Scheduling Instructions:     Side: Bilateral     Level: Lumbar     Device: [employer]     Sedation: With sedation     Timeframe: As soon as Emergency planning/management officer Question:   Where will this procedure be performed?  Answer:   [facility]   Follow-up plan:   Return in about 20 days (around 10/23/2020) for [facility] Trial (2 hrs).   Recent Visits No visits were found meeting these conditions. Showing recent visits within past 90 days and meeting all other requirements Today's Visits Date Type Provider Dept  10/03/20 Telemedicine Gabrielle Santa, MD [facility]  Showing today's visits and meeting all other requirements Future Appointments No visits were found meeting these conditions. Showing future appointments within next 90 days and meeting all other requirements  I discussed the assessment and treatment plan with the patient. The patient was provided an opportunity to ask questions and all were answered. The patient agreed with the plan and demonstrated an understanding of the instructions.  Patient advised to call back or seek an in-person evaluation if the symptoms or condition worsens.  Duration of encounter:48mnutes.  Note by: BGillis Santa MD Date: 10/03/2020; Time: 3:07 PM

## 2020-10-13 ENCOUNTER — Encounter: Payer: Self-pay | Admitting: Physician Assistant

## 2020-10-14 ENCOUNTER — Telehealth: Payer: Self-pay

## 2020-10-14 NOTE — Progress Notes (Signed)
Chronic Care Management Pharmacy Assistant   Name: Gabrielle Pineda  MRN: [id number] DOB: 07-09-1956  Reason for Encounter: Medication Review  PCP : [name], PA-C  Allergies:   Allergies  Allergen Reactions  . Plaquenil [Hydroxychloroquine] Rash    Medications: Outpatient Encounter Medications as of 10/14/2020  Medication Sig  . acyclovir ointment (ZOVIRAX) 5 % Apply 1 application topically every 3 (three) hours.  . ARIPiprazole (ABILIFY) 10 MG tablet Take 1 tablet (10 mg total) by mouth daily.  Marland Kitchen atorvastatin (LIPITOR) 40 MG tablet Take 1 tablet (40 mg total) by mouth daily.  . busPIRone (BUSPAR) 30 MG tablet Take 1 tablet (30 mg total) by mouth 3 (three) times daily.  Marland Kitchen diltiazem (CARDIZEM CD) 180 MG 24 hr capsule Take 1 capsule (180 mg total) by mouth daily.  Marland Kitchen ezetimibe (ZETIA) 10 MG tablet Take 1 tablet (10 mg total) by mouth daily.  . fluticasone (FLONASE) 50 MCG/ACT nasal spray Place 2 sprays into both nostrils daily.  . Fluticasone-Umeclidin-Vilant (TRELEGY ELLIPTA) 100-62.5-25 MCG/INH AEPB Inhale 1 puff into the lungs daily.  . furosemide (LASIX) 20 MG tablet TAKE 1 TABLET BY MOUTH ONCE DAILY AS NEEDED FOR FLUID  . gabapentin (NEURONTIN) 600 MG tablet Take 1 tablet (600 mg total) by mouth 3 (three) times daily.  . Guaifenesin 1200 MG TB12 Take 1,200 mg by mouth daily.  . hydrOXYzine (ATARAX/VISTARIL) 10 MG tablet Take 1 tablet (10 mg total) by mouth 3 (three) times daily as needed (hives).  Marland Kitchen ibuprofen (ADVIL) 200 MG tablet Take 3 tablets (600 mg total) by mouth every 8 (eight) hours as needed. For pain  . ipratropium (ATROVENT) 0.02 % nebulizer solution Take 2.5 mLs (0.5 mg total) by nebulization 4 (four) times daily as needed for wheezing or shortness of breath.  . leflunomide (ARAVA) 10 MG tablet Take 10 mg by mouth at bedtime.   Marland Kitchen LINZESS 145 MCG CAPS capsule Take 145 mcg by mouth daily before breakfast.  . meloxicam (MOBIC) 15 MG tablet Take 1 tablet  (15 mg total) by mouth at bedtime.  . Multiple Vitamin (MULTIVITAMIN WITH MINERALS) TABS tablet Take 1 tablet by mouth daily.  . Nutritional Supplements (ESTROVEN ENERGY PO) Take 1 tablet by mouth daily.   Marland Kitchen omeprazole (PRILOSEC) 40 MG capsule Take 1 capsule (40 mg total) by mouth 2 (two) times daily.  Marland Kitchen oxyCODONE (ROXICODONE) 5 MG immediate release tablet Take 1 tablet (5 mg total) by mouth every 4 (four) hours as needed for severe pain.  . potassium chloride SA (KLOR-CON) 20 MEQ tablet Take 1 tablet (20 mEq total) by mouth daily as needed (take with furosemide (fluid retention)).  Marland Kitchen sertraline (ZOLOFT) 100 MG tablet Take 1 tablet (100 mg total) by mouth daily.  Marland Kitchen topiramate (TOPAMAX) 25 MG tablet Take 1 tablet (25 mg total) by mouth 2 (two) times daily.  . traZODone (DESYREL) 100 MG tablet Take 3 tablets (300 mg total) by mouth at bedtime.  . valACYclovir (VALTREX) 1000 MG tablet TAKE 2 TABLETS BY MOUTH TWICE DAILY AS NEEDED FOR 5 DAYS  . venlafaxine XR (EFFEXOR-XR) 75 MG 24 hr capsule TAKE 3 CAPSULES BY MOUTH ONCE DAILY WITH BREAKFAST  . vitamin B-12 (CYANOCOBALAMIN) 1000 MCG tablet Take 6,000 mcg by mouth daily.  . vitamin E 400 UNIT capsule Take 1,600 Units by mouth daily.  Marland Kitchen XIIDRA 5 % SOLN Place 1 drop into both eyes daily.   No facility-administered encounter medications on file as of 10/14/2020.  Current Diagnosis: Patient Active Problem List   Diagnosis Date Noted  . Chronic radicular lumbar pain 10/03/2020  . Postlaminectomy syndrome, lumbar region 06/03/2020  . Acute non-recurrent pansinusitis 03/12/2020  . Moderate episode of recurrent major depressive disorder ([place]) 12/04/2019  . Spondylosis of cervical region without myelopathy or radiculopathy 09/21/2019  . DDD (degenerative disc disease), cervical 09/21/2019  . Cervicalgia 09/21/2019  . Cervical fusion syndrome 09/21/2019  . Chronic pain syndrome 09/21/2019  . Spinal stenosis, lumbar region, with neurogenic  claudication 08/15/2019  . History of adenomatous polyp of colon 11/04/2017  . Cervical radiculopathy 08/02/2017  . Neuropathy 08/02/2017  . Essential hypertension 07/28/2017  . Closed compression fracture of L5 lumbar vertebra 07/07/2017  . Sacral insufficiency fracture with routine healing 07/07/2017  . COPD (chronic obstructive pulmonary disease) ([place]) 04/10/2015  . Family history of malignant neoplasm of pancreas 04/03/2015  . Hypercholesteremia 04/03/2015  . Disorder of iron metabolism 04/03/2015  . Weight loss 04/03/2015  . Delayed onset of urination 04/03/2015  . Acid reflux 10/11/2014  . Closed fracture of distal phalanx of thumb 08/15/2014  . Arthritis, degenerative 01/30/2014  . Arthritis or polyarthritis, rheumatoid ([place]) 01/30/2014  . Shortness of breath 12/22/2013  . SMOKER 09/22/2010  . Paroxysmal supraventricular tachycardia ([place]) 09/22/2010  . CHEST PAIN UNSPECIFIED 09/22/2010  . ELECTROCARDIOGRAM, ABNORMAL 09/22/2010  . B-complex deficiency 09/09/2007  . CN (constipation) 06/26/2007  . Clinical depression 06/26/2007  . Cold sore 06/26/2007  . H/O alcohol abuse 06/26/2007  . Cannot sleep 06/26/2007  . Localized osteoarthrosis, hand 06/26/2007  . Menopausal symptom 06/26/2007    Goals Addressed   None    Reviewed chart and adherence measures. Per insurance data patient is 90-99 % adherent to atorvastatin . Follow-Up:  Pharmacist Review   Bessie Towner Pharmacist Assistant [phone]

## 2020-10-15 DIAGNOSIS — S92332D Displaced fracture of third metatarsal bone, left foot, subsequent encounter for fracture with routine healing: Secondary | ICD-10-CM | POA: Diagnosis not present

## 2020-10-15 DIAGNOSIS — S92322D Displaced fracture of second metatarsal bone, left foot, subsequent encounter for fracture with routine healing: Secondary | ICD-10-CM | POA: Diagnosis not present

## 2020-10-15 DIAGNOSIS — S92342A Displaced fracture of fourth metatarsal bone, left foot, initial encounter for closed fracture: Secondary | ICD-10-CM | POA: Diagnosis not present

## 2020-10-24 ENCOUNTER — Telehealth: Payer: Self-pay

## 2020-10-24 NOTE — Progress Notes (Signed)
Chronic Care Management Pharmacy Assistant   Name: Gabrielle Pineda  MRN: [id number] DOB: 1956/06/22   Reason for Encounter: Medication Review     Primary concerns for visit include: Dispensing medication call.   Recent office visits:  No recent office visit  Recent consult visits:  10/03/2020 Pain Management  [facility] visits:  None in previous 6 months  Medications: Outpatient Encounter Medications as of 10/24/2020  Medication Sig   acyclovir ointment (ZOVIRAX) 5 % Apply 1 application topically every 3 (three) hours.   ARIPiprazole (ABILIFY) 10 MG tablet Take 1 tablet (10 mg total) by mouth daily.   atorvastatin (LIPITOR) 40 MG tablet Take 1 tablet (40 mg total) by mouth daily.   busPIRone (BUSPAR) 30 MG tablet Take 1 tablet (30 mg total) by mouth 3 (three) times daily.   diltiazem (CARDIZEM CD) 180 MG 24 hr capsule Take 1 capsule (180 mg total) by mouth daily.   ezetimibe (ZETIA) 10 MG tablet Take 1 tablet (10 mg total) by mouth daily.   fluticasone (FLONASE) 50 MCG/ACT nasal spray Place 2 sprays into both nostrils daily.   Fluticasone-Umeclidin-Vilant (TRELEGY ELLIPTA) 100-62.5-25 MCG/INH AEPB Inhale 1 puff into the lungs daily.   furosemide (LASIX) 20 MG tablet TAKE 1 TABLET BY MOUTH ONCE DAILY AS NEEDED FOR FLUID   gabapentin (NEURONTIN) 600 MG tablet Take 1 tablet (600 mg total) by mouth 3 (three) times daily.   Guaifenesin 1200 MG TB12 Take 1,200 mg by mouth daily.   hydrOXYzine (ATARAX/VISTARIL) 10 MG tablet Take 1 tablet (10 mg total) by mouth 3 (three) times daily as needed (hives).   ibuprofen (ADVIL) 200 MG tablet Take 3 tablets (600 mg total) by mouth every 8 (eight) hours as needed. For pain   ipratropium (ATROVENT) 0.02 % nebulizer solution Take 2.5 mLs (0.5 mg total) by nebulization 4 (four) times daily as needed for wheezing or shortness of breath.   leflunomide (ARAVA) 10 MG tablet Take 10 mg by mouth at bedtime.    LINZESS  145 MCG CAPS capsule Take 145 mcg by mouth daily before breakfast.   meloxicam (MOBIC) 15 MG tablet Take 1 tablet (15 mg total) by mouth at bedtime.   Multiple Vitamin (MULTIVITAMIN WITH MINERALS) TABS tablet Take 1 tablet by mouth daily.   Nutritional Supplements (ESTROVEN ENERGY PO) Take 1 tablet by mouth daily.    omeprazole (PRILOSEC) 40 MG capsule Take 1 capsule (40 mg total) by mouth 2 (two) times daily.   oxyCODONE (ROXICODONE) 5 MG immediate release tablet Take 1 tablet (5 mg total) by mouth every 4 (four) hours as needed for severe pain.   potassium chloride SA (KLOR-CON) 20 MEQ tablet Take 1 tablet (20 mEq total) by mouth daily as needed (take with furosemide (fluid retention)).   sertraline (ZOLOFT) 100 MG tablet Take 1 tablet (100 mg total) by mouth daily.   topiramate (TOPAMAX) 25 MG tablet Take 1 tablet (25 mg total) by mouth 2 (two) times daily.   traZODone (DESYREL) 100 MG tablet Take 3 tablets (300 mg total) by mouth at bedtime.   valACYclovir (VALTREX) 1000 MG tablet TAKE 2 TABLETS BY MOUTH TWICE DAILY AS NEEDED FOR 5 DAYS   venlafaxine XR (EFFEXOR-XR) 75 MG 24 hr capsule TAKE 3 CAPSULES BY MOUTH ONCE DAILY WITH BREAKFAST   vitamin B-12 (CYANOCOBALAMIN) 1000 MCG tablet Take 6,000 mcg by mouth daily.   vitamin E 400 UNIT capsule Take 1,600 Units by mouth daily.   XIIDRA 5 %  SOLN Place 1 drop into both eyes daily.   No facility-administered encounter medications on file as of 10/24/2020.     Star Rating Drugs:atorvastatin  Reviewed chart for medication changes ahead of medication coordination call.  No medication changes indicated  BP Readings from Last 3 Encounters:  06/03/20 139/87  04/19/20 127/79  12/18/19 130/90    Lab Results  Component Value Date   HGBA1C 5.0 04/14/2018     Patient obtains medications through Adherence Packaging  30 Days   Last adherence delivery included:  Aripiprazole 10 mg tablet Take one tablet by mouth daily  (breakfast)  Atorvastatin 40 mg tablet- Take one tablet by mouth daily (breakfast)  Buspirone 30 mg tablet- Take one tablet by mouth three times daily (breakfast, lunch, bedtime)  Diltiazem Cd 180 mg capsule ER- Take one tablet by mouth daily (breakfast)  Ezetimibe 10 mg tablet Take one tablet by mouth daily (breakfast)  Gabapentin 600 mg tablet- Take one tablet by mouth three times daily (breakfast, lunch bedtime)  Leflunomide 10 mg tablet- Take one tablet by mouth once daily (bedtime)  Linzess 145 mcg- Take one tablet by mouth daily (before breakfast)  Meloxicam 15 mg tablet- Take one tablet by mouth once daily (bedtime)   Omeprazole 40 my- Take one capsule by mouth two times a day (breakfast, bedtime)  Sertraline 100 mg tablet- Take one tablet by mouth daily (bedtime)  Topiramate 25 mg tablet-Take one tablet by mouth twice daily (breakfast, bedtime)   Trazodone 100 mg tablet Take three tablets by mouth everyday (bedtime)  Venlafaxine ER 75 mg capsule- Take three capsules by mouth daily (breakfast  Coordinated acute fill for Trelegy 135mcg   to be delivered on 09/30/2020.  Patient declined medications last month   Ipratropium Bromide 0.02 solution- Inhale 2.56mls via nebulizer four times daily (PRN)  Hydroxyzine Hcl 10 mg tablet- Take one table by mouth three times daily (PRN)  Furosemide20 mg tablet-Take one tablet by mouth once daily (PRN)  Potassium Cl Er 20 Meq tablet- Take one tablet by mouth daily (take with furosemide) (prn)  Valacyclovir 1g 2 tablets by mouth twice daily for 5 days  Acyclovir 5% cream - Adequate supply  Patient is due for next adherence delivery on: 11/01/2020. Called patient and reviewed medications and coordinated delivery.  This delivery to include:  Aripiprazole 10 mg tablet Take one tablet by mouth daily (breakfast)  Atorvastatin 40 mg tablet- Take one tablet by mouth daily (breakfast)  Buspirone 30 mg tablet- Take one tablet by  mouth three times daily (breakfast, lunch, bedtime)  Diltiazem Cd 180 mg capsule ER- Take one tablet by mouth daily (breakfast)  Ezetimibe 10 mg tablet Take one tablet by mouth daily (breakfast)  Gabapentin 600 mg tablet- Take one tablet by mouth three times daily (breakfast, lunch bedtime)  Leflunomide 10 mg tablet- Take one tablet by mouth once daily (bedtime)  Linzess 145 mcg- Take one tablet by mouth daily (before breakfast)  Meloxicam 15 mg tablet- Take one tablet by mouth once daily (bedtime)   Omeprazole 40 my- Take one capsule by mouth two times a day (breakfast, bedtime)  Sertraline 100 mg tablet- Take one tablet by mouth daily (bedtime)  Topiramate 25 mg tablet-Take one tablet by mouth twice daily (breakfast, bedtime)   Trazodone 100 mg tablet Take three tablets by mouth everyday (bedtime)  Venlafaxine ER 75 mg capsule- Take three capsules by mouth daily (breakfast)  Trelegy 115mcg Inhale 1 puff into the lungs daily  Patient will not  need a short fill of medication, prior to adherence delivery.  Patient will not need a  acute fill of medication, prior to adherence delivery.  Patient declined the following medications:  Ipratropium Bromide 0.02 solution- Inhale 2.54mls via nebulizer four times daily (PRN)  Hydroxyzine Hcl 10 mg tablet- Take one table by mouth three times daily (PRN)  Furosemide20 mg tablet-Take one tablet by mouth once daily (PRN)  Potassium Cl Er 20 Meq tablet- Take one tablet by mouth daily (take with furosemide) (prn)  Valacyclovir 1g 2 tablets by mouth twice daily for 5 days  Acyclovir 5% cream - Adequate supply (one full  tube left)  Patient needs refills for None ID .  Confirmed delivery date of 11/01/2020, advised patient that pharmacy will contact them the morning of delivery.  Blood pressure readings: Patient states her blood pressure has been ranging around 128/79.  [place] Pharmacist  Assistant [phone]

## 2020-10-28 ENCOUNTER — Telehealth: Payer: Self-pay

## 2020-10-28 NOTE — Progress Notes (Signed)
Patient requesting refill for ipratropium (ATROVENT) 0.02 % nebulizer solution.Completed acute form and sent to clinical pharmacist for review.  [place] Pharmacist Assistant [phone]

## 2020-10-30 ENCOUNTER — Telehealth: Payer: PPO | Admitting: Physician Assistant

## 2020-10-30 ENCOUNTER — Ambulatory Visit: Payer: Self-pay | Admitting: *Deleted

## 2020-10-30 DIAGNOSIS — I4581 Long QT syndrome: Secondary | ICD-10-CM | POA: Diagnosis not present

## 2020-10-30 DIAGNOSIS — F17200 Nicotine dependence, unspecified, uncomplicated: Secondary | ICD-10-CM | POA: Diagnosis not present

## 2020-10-30 DIAGNOSIS — G43909 Migraine, unspecified, not intractable, without status migrainosus: Secondary | ICD-10-CM | POA: Diagnosis not present

## 2020-10-30 DIAGNOSIS — J9601 Acute respiratory failure with hypoxia: Secondary | ICD-10-CM | POA: Insufficient documentation

## 2020-10-30 DIAGNOSIS — B348 Other viral infections of unspecified site: Secondary | ICD-10-CM | POA: Diagnosis not present

## 2020-10-30 DIAGNOSIS — Z720 Tobacco use: Secondary | ICD-10-CM | POA: Diagnosis not present

## 2020-10-30 DIAGNOSIS — Z20822 Contact with and (suspected) exposure to covid-19: Secondary | ICD-10-CM | POA: Diagnosis not present

## 2020-10-30 DIAGNOSIS — F17210 Nicotine dependence, cigarettes, uncomplicated: Secondary | ICD-10-CM | POA: Diagnosis not present

## 2020-10-30 DIAGNOSIS — I471 Supraventricular tachycardia: Secondary | ICD-10-CM | POA: Diagnosis not present

## 2020-10-30 DIAGNOSIS — M1990 Unspecified osteoarthritis, unspecified site: Secondary | ICD-10-CM | POA: Diagnosis not present

## 2020-10-30 DIAGNOSIS — R918 Other nonspecific abnormal finding of lung field: Secondary | ICD-10-CM | POA: Diagnosis not present

## 2020-10-30 DIAGNOSIS — M069 Rheumatoid arthritis, unspecified: Secondary | ICD-10-CM | POA: Diagnosis not present

## 2020-10-30 DIAGNOSIS — G629 Polyneuropathy, unspecified: Secondary | ICD-10-CM | POA: Diagnosis not present

## 2020-10-30 DIAGNOSIS — I493 Ventricular premature depolarization: Secondary | ICD-10-CM | POA: Diagnosis not present

## 2020-10-30 DIAGNOSIS — R0902 Hypoxemia: Secondary | ICD-10-CM | POA: Diagnosis not present

## 2020-10-30 DIAGNOSIS — E876 Hypokalemia: Secondary | ICD-10-CM | POA: Diagnosis not present

## 2020-10-30 DIAGNOSIS — E7800 Pure hypercholesterolemia, unspecified: Secondary | ICD-10-CM | POA: Diagnosis not present

## 2020-10-30 DIAGNOSIS — J441 Chronic obstructive pulmonary disease with (acute) exacerbation: Secondary | ICD-10-CM | POA: Diagnosis not present

## 2020-10-30 DIAGNOSIS — K219 Gastro-esophageal reflux disease without esophagitis: Secondary | ICD-10-CM | POA: Diagnosis not present

## 2020-10-30 DIAGNOSIS — F419 Anxiety disorder, unspecified: Secondary | ICD-10-CM | POA: Diagnosis not present

## 2020-10-30 DIAGNOSIS — Z791 Long term (current) use of non-steroidal anti-inflammatories (NSAID): Secondary | ICD-10-CM | POA: Diagnosis not present

## 2020-10-30 DIAGNOSIS — G894 Chronic pain syndrome: Secondary | ICD-10-CM | POA: Diagnosis not present

## 2020-10-30 DIAGNOSIS — Z6822 Body mass index (BMI) 22.0-22.9, adult: Secondary | ICD-10-CM | POA: Diagnosis not present

## 2020-10-30 DIAGNOSIS — F32A Depression, unspecified: Secondary | ICD-10-CM | POA: Diagnosis not present

## 2020-10-30 DIAGNOSIS — G8929 Other chronic pain: Secondary | ICD-10-CM | POA: Diagnosis not present

## 2020-10-30 DIAGNOSIS — I10 Essential (primary) hypertension: Secondary | ICD-10-CM | POA: Diagnosis not present

## 2020-10-30 DIAGNOSIS — R0602 Shortness of breath: Secondary | ICD-10-CM | POA: Diagnosis not present

## 2020-10-30 DIAGNOSIS — R9431 Abnormal electrocardiogram [ECG] [EKG]: Secondary | ICD-10-CM | POA: Diagnosis not present

## 2020-10-30 DIAGNOSIS — Z883 Allergy status to other anti-infective agents status: Secondary | ICD-10-CM | POA: Diagnosis not present

## 2020-10-30 DIAGNOSIS — R000 Tachycardia, unspecified: Secondary | ICD-10-CM | POA: Diagnosis not present

## 2020-10-30 DIAGNOSIS — I071 Rheumatic tricuspid insufficiency: Secondary | ICD-10-CM | POA: Diagnosis not present

## 2020-10-30 DIAGNOSIS — J122 Parainfluenza virus pneumonia: Secondary | ICD-10-CM | POA: Diagnosis not present

## 2020-10-30 NOTE — Telephone Encounter (Signed)
Pt called stating she is having shortness of breath and wheezing that started on 10/27/20; she says her shortness of breath is constant and it is worse than normal with her COPD; recommendations made per nurse triage protocol; she verbalized understanding and will go to the ED; the pt was previously scheduled for appt with Carles Collet, [facility] on 10/30/20 at 67; pt notified this will be cancelled since she is going to the ED; will route to office for notification.    Reason for Disposition . [1] MODERATE difficulty breathing (e.g., speaks in phrases, SOB even at rest, pulse 100-120) AND [2] NEW-onset or WORSE than normal  Answer Assessment - Initial Assessment Questions 1. RESPIRATORY STATUS: "Describe your breathing?" (e.g., wheezing, shortness of breath, unable to speak, severe coughing)      Constant shortness of breath 2. ONSET: "When did this breathing problem begin?"  10/27/20 3. PATTERN "Does the difficult breathing come and go, or has it been constant since it started?"      constant 4. SEVERITY: "How bad is your breathing?" (e.g., mild, moderate, severe)    - MILD: No SOB at rest, mild SOB with walking, speaks normally in sentences, can lay down, no retractions, pulse < 100.    - MODERATE: SOB at rest, SOB with minimal exertion and prefers to sit, cannot lie down flat, speaks in phrases, mild retractions, audible wheezing, pulse 100-120.    - SEVERE: Very SOB at rest, speaks in single words, struggling to breathe, sitting hunched forward, retractions, pulse > 120     Moderate to severe 5. RECURRENT SYMPTOM: "Have you had difficulty breathing before?" If Yes, ask: "When was the last time?" and "What happened that time?"      Pt has COPD 6. CARDIAC HISTORY: "Do you have any history of heart disease?" (e.g., heart attack, angina, bypass surgery, angioplasty)     7. LUNG HISTORY: "Do you have any history of lung disease?"  (e.g., pulmonary embolus, asthma, emphysema)      COPD 8. CAUSE: "What do you think is causing the breathing problem?"       9. OTHER SYMPTOMS: "Do you have any other symptoms? (e.g., dizziness, runny nose, cough, chest pain, fever)    wheezing 10. PREGNANCY: "Is there any chance you are pregnant?" "When was your last menstrual period?"        11. TRAVEL: "Have you traveled out of the country in the last month?" (e.g., travel history, exposures)  Protocols used: BREATHING DIFFICULTY-A-AH

## 2020-10-31 ENCOUNTER — Telehealth: Payer: PPO | Admitting: Physician Assistant

## 2020-11-06 ENCOUNTER — Ambulatory Visit: Payer: PPO | Admitting: Student in an Organized Health Care Education/Training Program

## 2020-11-08 ENCOUNTER — Ambulatory Visit (INDEPENDENT_AMBULATORY_CARE_PROVIDER_SITE_OTHER): Payer: PPO | Admitting: Physician Assistant

## 2020-11-08 ENCOUNTER — Encounter: Payer: Self-pay | Admitting: Physician Assistant

## 2020-11-08 ENCOUNTER — Other Ambulatory Visit: Payer: Self-pay

## 2020-11-08 VITALS — BP 114/76 | HR 76 | Temp 98.4°F | Wt 143.0 lb

## 2020-11-08 DIAGNOSIS — E876 Hypokalemia: Secondary | ICD-10-CM

## 2020-11-08 DIAGNOSIS — R9431 Abnormal electrocardiogram [ECG] [EKG]: Secondary | ICD-10-CM

## 2020-11-08 DIAGNOSIS — J9601 Acute respiratory failure with hypoxia: Secondary | ICD-10-CM | POA: Diagnosis not present

## 2020-11-08 DIAGNOSIS — F5101 Primary insomnia: Secondary | ICD-10-CM | POA: Diagnosis not present

## 2020-11-08 DIAGNOSIS — J101 Influenza due to other identified influenza virus with other respiratory manifestations: Secondary | ICD-10-CM

## 2020-11-08 MED ORDER — ESZOPICLONE 3 MG PO TABS: 3.0000 mg | ORAL_TABLET | Freq: Every day | ORAL | 0 refills | Status: DC

## 2020-11-08 MED ORDER — PREDNISONE 10 MG PO TABS: ORAL_TABLET | ORAL | 0 refills | Status: DC

## 2020-11-08 NOTE — Patient Instructions (Signed)
Eszopiclone tablets What is this medicine? ESZOPICLONE (es ZOE pi clone) is used to treat insomnia. This medicine helps you to fall asleep and sleep through the night. This medicine may be used for other purposes; ask your health care provider or pharmacist if you have questions. COMMON BRAND NAME(S): Lunesta What should I tell my health care provider before I take this medicine? They need to know if you have any of these conditions:  depression  history of a drug or alcohol abuse problem  liver disease  lung or breathing disease  sleep-walking, driving, eating or other activity while not fully awake after taking a sleep medicine  suicidal thoughts  an unusual or allergic reaction to eszopiclone, other medicines, foods, dyes, or preservatives  pregnant or trying to get pregnant  breast-feeding How should I use this medicine? Take this medicine by mouth with a glass of water. Follow the directions on the prescription label. It is better to take this medicine on an empty stomach and only when you are ready for bed. Do not take your medicine more often than directed. If you have been taking this medicine for several weeks and suddenly stop taking it, you may get unpleasant withdrawal symptoms. Your doctor or health care professional may want to gradually reduce the dose. Do not stop taking this medicine on your own. Always follow your doctor or health care professional's advice. Talk to your pediatrician regarding the use of this medicine in children. Special care may be needed. Overdosage: If you think you have taken too much of this medicine contact a poison control center or emergency room at once. NOTE: This medicine is only for you. Do not share this medicine with others. What if I miss a dose? This does not apply. This medicine should only be taken immediately before going to sleep. Do not take double or extra doses. What may interact with this medicine?  herbal medicines like  kava kava, melatonin, St. John's wort and valerian  lorazepam  medicines for fungal infections like ketoconazole, fluconazole, or itraconazole  olanzapine This list may not describe all possible interactions. Give your health care provider a list of all the medicines, herbs, non-prescription drugs, or dietary supplements you use. Also tell them if you smoke, drink alcohol, or use illegal drugs. Some items may interact with your medicine. What should I watch for while using this medicine? Visit your doctor or health care professional for regular checks on your progress. Keep a regular sleep schedule by going to bed at about the same time nightly. Avoid caffeine-containing drinks in the evening hours, as caffeine can cause trouble with falling asleep. Talk to your doctor if you still have trouble sleeping. After taking this medicine, you may get up out of bed and do an activity that you do not know you are doing. The next morning, you may have no memory of this. Activities include driving a car ("sleep-driving"), making and eating food, talking on the phone, sexual activity, and sleep-walking. Serious injuries have occurred. Stop the medicine and call your doctor right away if you find out you have done any of these activities. Do not take this medicine if you have used alcohol that evening. Do not take it if you have taken another medicine for sleep. The risk of doing these sleep-related activities is higher. Do not take this medicine unless you are able to stay in bed for a full night (7 to 8 hours) before you must be active again. You may have a   decrease in mental alertness the day after use, even if you feel that you are fully awake. Tell your doctor if you will need to perform activities requiring full alertness, such as driving, the next day. Do not stand or sit up quickly after taking this medicine, especially if you are an older patient. This reduces the risk of dizzy or fainting spells. If you or  your family notice any changes in your behavior, such as new or worsening depression, thoughts of harming yourself, anxiety, other unusual or disturbing thoughts, or memory loss, call your doctor right away. After you stop taking this medicine, you may have trouble falling asleep. This is called rebound insomnia. This problem usually goes away on its own after 1 or 2 nights. What side effects may I notice from receiving this medicine? Side effects that you should report to your doctor or health care professional as soon as possible:  allergic reactions like skin rash, itching or hives, swelling of the face, lips, or tongue  changes in vision  confusion  depressed mood  feeling faint or lightheaded, falls  hallucinations  problems with balance, speaking, walking  restlessness, excitability, or feelings of agitation  unusual activities while not fully awake like driving, eating, making phone calls Side effects that usually do not require medical attention (report to your doctor or health care professional if they continue or are bothersome):  dizziness, or daytime drowsiness, sometimes called a hangover effect  headache This list may not describe all possible side effects. Call your doctor for medical advice about side effects. You may report side effects to FDA at 1-800-FDA-1088. Where should I keep my medicine? Keep out of the reach of children. This medicine can be abused. Keep your medicine in a safe place to protect it from theft. Do not share this medicine with anyone. Selling or giving away this medicine is dangerous and against the law. This medicine may cause accidental overdose and death if taken by other adults, children, or pets. Mix any unused medicine with a substance like cat litter or coffee grounds. Then throw the medicine away in a sealed container like a sealed bag or a coffee can with a lid. Do not use the medicine after the expiration date. Store at room temperature  between 15 and 30 degrees C (59 and 86 degrees F). NOTE: This sheet is a summary. It may not cover all possible information. If you have questions about this medicine, talk to your doctor, pharmacist, or health care provider.  2021 Elsevier/Gold Standard (2018-01-28 11:57:05)

## 2020-11-08 NOTE — Progress Notes (Signed)
Established patient visit   Patient: Gabrielle Pineda   DOB: June 25, 1956   65 y.o. Female  MRN: [id number] Visit Date: 11/08/2020  Today's healthcare provider: [name], PA-C   Chief Complaint  Patient presents with  . Hospitalization Follow-up   Subjective    HPI  Follow up Hospitalization  Patient was admitted to [facility] on 10/30/2020 and discharged on 11/03/2020. She was treated for Acute hypoxemic respiratory failure due to COPD exacerbation, pt tested positive for Flu A. Treatment for this included rx for prednisone and carvedilol 12.5mg  Telephone follow up was not done. She reports excellent compliance with treatment. She reports this condition is improved, but not back to baseline. ----------------------------------------------------------------------------------------- -    Patient Active Problem List   Diagnosis Date Noted  . Chronic radicular lumbar pain 10/03/2020  . Postlaminectomy syndrome, lumbar region 06/03/2020  . Acute non-recurrent pansinusitis 03/12/2020  . Moderate episode of recurrent major depressive disorder ([place]) 12/04/2019  . Spondylosis of cervical region without myelopathy or radiculopathy 09/21/2019  . DDD (degenerative disc disease), cervical 09/21/2019  . Cervicalgia 09/21/2019  . Cervical fusion syndrome 09/21/2019  . Chronic pain syndrome 09/21/2019  . Spinal stenosis, lumbar region, with neurogenic claudication 08/15/2019  . History of adenomatous polyp of colon 11/04/2017  . Cervical radiculopathy 08/02/2017  . Neuropathy 08/02/2017  . Essential hypertension 07/28/2017  . Closed compression fracture of L5 lumbar vertebra 07/07/2017  . Sacral insufficiency fracture with routine healing 07/07/2017  . COPD (chronic obstructive pulmonary disease) ([place]) 04/10/2015  . Family history of malignant neoplasm of pancreas 04/03/2015  . Hypercholesteremia 04/03/2015  . Disorder of iron metabolism 04/03/2015  . Weight  loss 04/03/2015  . Delayed onset of urination 04/03/2015  . Acid reflux 10/11/2014  . Closed fracture of distal phalanx of thumb 08/15/2014  . Arthritis, degenerative 01/30/2014  . Arthritis or polyarthritis, rheumatoid ([place]) 01/30/2014  . Shortness of breath 12/22/2013  . SMOKER 09/22/2010  . Paroxysmal supraventricular tachycardia ([place]) 09/22/2010  . CHEST PAIN UNSPECIFIED 09/22/2010  . ELECTROCARDIOGRAM, ABNORMAL 09/22/2010  . B-complex deficiency 09/09/2007  . CN (constipation) 06/26/2007  . Clinical depression 06/26/2007  . Cold sore 06/26/2007  . H/O alcohol abuse 06/26/2007  . Cannot sleep 06/26/2007  . Localized osteoarthrosis, hand 06/26/2007  . Menopausal symptom 06/26/2007   Past Medical History:  Diagnosis Date  . Alcohol abuse   . Anemia   . Anxiety   . Arthritis   . Cervicalgia   . Cirrhosis ([place]) 1994  . COPD (chronic obstructive pulmonary disease) ([place])   . Depression   . Dyspnea   . GERD (gastroesophageal reflux disease)   . Headache   . Heart murmur    on heard when pt is lying  . Other and unspecified hyperlipidemia   . Tachycardia    d/t questionable anxiety happens every 5- 10 years, sees [facility]   Social History   Tobacco Use  . Smoking status: Current Every Day Smoker    Packs/day: 1.00    Years: 40.00    Pack years: 40.00  . Smokeless tobacco: Never Used  Vaping Use  . Vaping Use: Some days  . Devices: vape occasionally  Substance Use Topics  . Alcohol use: No    Comment: recovering alcoholic Since 7412   . Drug use: No   Allergies  Allergen Reactions  . Plaquenil [Hydroxychloroquine] Rash     Medications: Outpatient Medications Prior to Visit  Medication Sig  . acyclovir ointment (ZOVIRAX)  5 % Apply 1 application topically every 3 (three) hours.  . ARIPiprazole (ABILIFY) 10 MG tablet Take 1 tablet (10 mg total) by mouth daily.  Marland Kitchen atorvastatin (LIPITOR) 40 MG tablet Take 1 tablet (40 mg total) by mouth daily.  .  busPIRone (BUSPAR) 30 MG tablet Take 1 tablet (30 mg total) by mouth 3 (three) times daily.  . carvedilol (COREG) 12.5 MG tablet Take by mouth.  . ezetimibe (ZETIA) 10 MG tablet Take 1 tablet (10 mg total) by mouth daily.  . fluticasone (FLONASE) 50 MCG/ACT nasal spray Place 2 sprays into both nostrils daily.  . Fluticasone-Umeclidin-Vilant (TRELEGY ELLIPTA) 100-62.5-25 MCG/INH AEPB Inhale 1 puff into the lungs daily.  . furosemide (LASIX) 20 MG tablet TAKE 1 TABLET BY MOUTH ONCE DAILY AS NEEDED FOR FLUID  . gabapentin (NEURONTIN) 600 MG tablet Take 1 tablet (600 mg total) by mouth 3 (three) times daily.  . Guaifenesin 1200 MG TB12 Take 1,200 mg by mouth daily.  Marland Kitchen ibuprofen (ADVIL) 200 MG tablet Take 3 tablets (600 mg total) by mouth every 8 (eight) hours as needed. For pain  . ipratropium (ATROVENT) 0.02 % nebulizer solution Take 2.5 mLs (0.5 mg total) by nebulization 4 (four) times daily as needed for wheezing or shortness of breath.  . leflunomide (ARAVA) 10 MG tablet Take 10 mg by mouth at bedtime.   Marland Kitchen LINZESS 145 MCG CAPS capsule Take 145 mcg by mouth daily before breakfast.  . meloxicam (MOBIC) 15 MG tablet Take 1 tablet (15 mg total) by mouth at bedtime.  . Multiple Vitamin (MULTIVITAMIN WITH MINERALS) TABS tablet Take 1 tablet by mouth daily.  . Nutritional Supplements (ESTROVEN ENERGY PO) Take 1 tablet by mouth daily.   Marland Kitchen omeprazole (PRILOSEC) 40 MG capsule Take 1 capsule (40 mg total) by mouth 2 (two) times daily.  Marland Kitchen oxyCODONE (ROXICODONE) 5 MG immediate release tablet Take 1 tablet (5 mg total) by mouth every 4 (four) hours as needed for severe pain.  . potassium chloride SA (KLOR-CON) 20 MEQ tablet Take 1 tablet (20 mEq total) by mouth daily as needed (take with furosemide (fluid retention)).  Marland Kitchen sertraline (ZOLOFT) 100 MG tablet Take 1 tablet (100 mg total) by mouth daily.  Marland Kitchen topiramate (TOPAMAX) 25 MG tablet Take 1 tablet (25 mg total) by mouth 2 (two) times daily.  . traZODone  (DESYREL) 100 MG tablet Take 3 tablets (300 mg total) by mouth at bedtime.  . valACYclovir (VALTREX) 1000 MG tablet TAKE 2 TABLETS BY MOUTH TWICE DAILY AS NEEDED FOR 5 DAYS  . venlafaxine XR (EFFEXOR-XR) 75 MG 24 hr capsule TAKE 3 CAPSULES BY MOUTH ONCE DAILY WITH BREAKFAST  . vitamin B-12 (CYANOCOBALAMIN) 1000 MCG tablet Take 6,000 mcg by mouth daily.  . vitamin E 400 UNIT capsule Take 1,600 Units by mouth daily.  Marland Kitchen XIIDRA 5 % SOLN Place 1 drop into both eyes daily.  Marland Kitchen diltiazem (CARDIZEM CD) 180 MG 24 hr capsule Take 1 capsule (180 mg total) by mouth daily.  . hydrOXYzine (ATARAX/VISTARIL) 10 MG tablet Take 1 tablet (10 mg total) by mouth 3 (three) times daily as needed (hives).   No facility-administered medications prior to visit.    Review of Systems  Constitutional: Positive for fatigue. Negative for activity change, appetite change, chills, diaphoresis, fever and unexpected weight change.  Respiratory: Positive for cough and shortness of breath. Negative for apnea, choking, chest tightness, wheezing and stridor.   Cardiovascular: Negative.   Musculoskeletal: Negative for myalgias.  Neurological: Negative for  dizziness, light-headedness and headaches.        Objective    BP 114/76 (BP Location: Left Arm, Patient Position: Sitting, Cuff Size: Normal)   Pulse 76   Temp 98.4 F (36.9 C) (Oral)   Wt 143 lb (64.9 kg)   BMI 21.12 kg/m    Physical Exam Vitals reviewed.  Constitutional:      General: She is not in acute distress.    Appearance: Normal appearance. She is well-developed. She is not ill-appearing or diaphoretic.  Neck:     Thyroid: No thyromegaly.     Vascular: No JVD.     Trachea: No tracheal deviation.  Cardiovascular:     Rate and Rhythm: Normal rate and regular rhythm.     Pulses: Normal pulses.     Heart sounds: Normal heart sounds. No murmur heard. No friction rub. No gallop.   Pulmonary:     Effort: Pulmonary effort is normal. No respiratory  distress.     Breath sounds: Wheezing present. No rales.  Musculoskeletal:     Cervical back: Normal range of motion and neck supple. No tenderness.  Lymphadenopathy:     Cervical: No cervical adenopathy.  Neurological:     Mental Status: She is alert.  Psychiatric:        Mood and Affect: Mood normal.        Thought Content: Thought content normal.     No results found for any visits on 11/08/20.  Assessment & Plan     1. Hypokalemia Will check labs as below and f/u pending results. - Basic Metabolic Panel (BMET)  2. QT prolongation Noted in hospital. Has been off medications that may cause. EKG today shows NSR rate of 74 with QT of 444msec, personally reviewed by me.  - EKG 12-Lead  3. Acute respiratory failure with hypoxia (HCC) Improving slowly. Will check labs as below and f/u pending results. Add prednisone as she is still having cough and wheezing despite inhalers.  - Basic Metabolic Panel (BMET) - predniSONE (DELTASONE) 10 MG tablet; Take 6 tablets PO on day 1 and day 2, take 5 tablets PO on day 3 and day 4, take 4 tablets PO on day 5 and day 6, take 3 tablets PO on day 7 and day 8, take 2 tablets PO on day 9 and day 10, take one tablet PO on day 11 and day 12.  Dispense: 42 tablet; Refill: 0  4. Influenza A See above medical treatment plan. - predniSONE (DELTASONE) 10 MG tablet; Take 6 tablets PO on day 1 and day 2, take 5 tablets PO on day 3 and day 4, take 4 tablets PO on day 5 and day 6, take 3 tablets PO on day 7 and day 8, take 2 tablets PO on day 9 and day 10, take one tablet PO on day 11 and day 12.  Dispense: 42 tablet; Refill: 0  5. Primary insomnia Had to stop Trazodone due to QT prolongation. Will start Lunesta as below to treat insomnia better as she is struggling to sleep without any sleep aid. F/U in 4-6 weeks.  - Eszopiclone 3 MG TABS; Take 1 tablet (3 mg total) by mouth at bedtime. Take immediately before bedtime  Dispense: 90 tablet; Refill: 0   No  follow-ups on file.      Reynolds Bowl, PA-C, have reviewed all documentation for this visit. The documentation on 11/24/20 for the exam, diagnosis, procedures, and orders are all accurate and complete.  Rubye [place]  [facility] [phone] (phone) [phone] (fax)  [place]

## 2020-11-09 LAB — BASIC METABOLIC PANEL
BUN/Creatinine Ratio: 7 — ABNORMAL LOW (ref 12–28)
BUN: 7 mg/dL — ABNORMAL LOW (ref 8–27)
CO2: 20 mmol/L (ref 20–29)
Calcium: 9.5 mg/dL (ref 8.7–10.3)
Chloride: 104 mmol/L (ref 96–106)
Creatinine, Ser: 0.96 mg/dL (ref 0.57–1.00)
Glucose: 101 mg/dL — ABNORMAL HIGH (ref 65–99)
Potassium: 4.3 mmol/L (ref 3.5–5.2)
Sodium: 140 mmol/L (ref 134–144)
eGFR: 66 mL/min/{1.73_m2} (ref >59)

## 2020-11-13 ENCOUNTER — Telehealth: Payer: Self-pay

## 2020-11-13 NOTE — Progress Notes (Signed)
Chronic Care Management Pharmacy Assistant   Name: Gabrielle Pineda  MRN: [id number] DOB: [birth year]   Reason for Encounter: Medication Review    Medications: Outpatient Encounter Medications as of 11/13/2020  Medication Sig  . acyclovir ointment (ZOVIRAX) 5 % Apply 1 application topically every 3 (three) hours.  . ARIPiprazole (ABILIFY) 10 MG tablet Take 1 tablet (10 mg total) by mouth daily.  Marland Kitchen atorvastatin (LIPITOR) 40 MG tablet Take 1 tablet (40 mg total) by mouth daily.  . busPIRone (BUSPAR) 30 MG tablet Take 1 tablet (30 mg total) by mouth 3 (three) times daily.  . carvedilol (COREG) 12.5 MG tablet Take by mouth.  . Eszopiclone 3 MG TABS Take 1 tablet (3 mg total) by mouth at bedtime. Take immediately before bedtime  . ezetimibe (ZETIA) 10 MG tablet Take 1 tablet (10 mg total) by mouth daily.  . fluticasone (FLONASE) 50 MCG/ACT nasal spray Place 2 sprays into both nostrils daily.  . Fluticasone-Umeclidin-Vilant (TRELEGY ELLIPTA) 100-62.5-25 MCG/INH AEPB Inhale 1 puff into the lungs daily.  . furosemide (LASIX) 20 MG tablet TAKE 1 TABLET BY MOUTH ONCE DAILY AS NEEDED FOR FLUID  . gabapentin (NEURONTIN) 600 MG tablet Take 1 tablet (600 mg total) by mouth 3 (three) times daily.  . Guaifenesin 1200 MG TB12 Take 1,200 mg by mouth daily.  Marland Kitchen ibuprofen (ADVIL) 200 MG tablet Take 3 tablets (600 mg total) by mouth every 8 (eight) hours as needed. For pain  . ipratropium (ATROVENT) 0.02 % nebulizer solution Take 2.5 mLs (0.5 mg total) by nebulization 4 (four) times daily as needed for wheezing or shortness of breath.  . leflunomide (ARAVA) 10 MG tablet Take 10 mg by mouth at bedtime.   Marland Kitchen LINZESS 145 MCG CAPS capsule Take 145 mcg by mouth daily before breakfast.  . meloxicam (MOBIC) 15 MG tablet Take 1 tablet (15 mg total) by mouth at bedtime.  . Multiple Vitamin (MULTIVITAMIN WITH MINERALS) TABS tablet Take 1 tablet by mouth daily.  . Nutritional Supplements (ESTROVEN ENERGY PO) Take 1  tablet by mouth daily.   Marland Kitchen omeprazole (PRILOSEC) 40 MG capsule Take 1 capsule (40 mg total) by mouth 2 (two) times daily.  Marland Kitchen oxyCODONE (ROXICODONE) 5 MG immediate release tablet Take 1 tablet (5 mg total) by mouth every 4 (four) hours as needed for severe pain.  . potassium chloride SA (KLOR-CON) 20 MEQ tablet Take 1 tablet (20 mEq total) by mouth daily as needed (take with furosemide (fluid retention)).  . predniSONE (DELTASONE) 10 MG tablet Take 6 tablets PO on day 1 and day 2, take 5 tablets PO on day 3 and day 4, take 4 tablets PO on day 5 and day 6, take 3 tablets PO on day 7 and day 8, take 2 tablets PO on day 9 and day 10, take one tablet PO on day 11 and day 12.  . sertraline (ZOLOFT) 100 MG tablet Take 1 tablet (100 mg total) by mouth daily.  Marland Kitchen topiramate (TOPAMAX) 25 MG tablet Take 1 tablet (25 mg total) by mouth 2 (two) times daily.  . valACYclovir (VALTREX) 1000 MG tablet TAKE 2 TABLETS BY MOUTH TWICE DAILY AS NEEDED FOR 5 DAYS  . venlafaxine XR (EFFEXOR-XR) 75 MG 24 hr capsule TAKE 3 CAPSULES BY MOUTH ONCE DAILY WITH BREAKFAST  . vitamin B-12 (CYANOCOBALAMIN) 1000 MCG tablet Take 6,000 mcg by mouth daily.  . vitamin E 400 UNIT capsule Take 1,600 Units by mouth daily.  Marland Kitchen XIIDRA 5 % SOLN  Place 1 drop into both eyes daily.   No facility-administered encounter medications on file as of 11/13/2020.    Reviewed chart and adherence measures. Per insurance data patient is 90-99 % adherent to atorvastatin .  [place] Pharmacist Assistant [phone]

## 2020-11-20 ENCOUNTER — Encounter: Payer: Self-pay | Admitting: Family

## 2020-11-20 ENCOUNTER — Ambulatory Visit: Payer: PPO | Admitting: Family

## 2020-11-20 ENCOUNTER — Other Ambulatory Visit: Payer: Self-pay

## 2020-11-20 VITALS — BP 110/78 | HR 68 | Ht 69.0 in | Wt 148.0 lb

## 2020-11-20 DIAGNOSIS — E782 Mixed hyperlipidemia: Secondary | ICD-10-CM | POA: Diagnosis not present

## 2020-11-20 DIAGNOSIS — Z720 Tobacco use: Secondary | ICD-10-CM

## 2020-11-20 DIAGNOSIS — I10 Essential (primary) hypertension: Secondary | ICD-10-CM

## 2020-11-20 DIAGNOSIS — J449 Chronic obstructive pulmonary disease, unspecified: Secondary | ICD-10-CM | POA: Diagnosis not present

## 2020-11-20 DIAGNOSIS — I471 Supraventricular tachycardia, unspecified: Secondary | ICD-10-CM

## 2020-11-20 MED ORDER — CARVEDILOL 12.5 MG PO TABS: 12.5000 mg | ORAL_TABLET | Freq: Two times a day (BID) | ORAL | 5 refills | Status: DC

## 2020-11-20 NOTE — Progress Notes (Signed)
Office Visit    Patient Name: Gabrielle Pineda Date of Encounter: 11/20/2020  PCP:  Burnette, Jennifer M, [place]  Cardiologist:  Ida Rogue, MD  Advanced Practice Provider:  No care team member to display Electrophysiologist:  None   Chief Complaint    Gabrielle Pineda is a 65 y.o. female with a hx of tobacco use, hyperlipidemia, tachycardia/possible SVT, chronic pain, hypertension, RA, COPD presents today for hospital follow up   Past Medical History    Past Medical History:  Diagnosis Date  . Alcohol abuse   . Anemia   . Anxiety   . Arthritis   . Cervicalgia   . Cirrhosis ([place]) 1994  . COPD (chronic obstructive pulmonary disease) ([place])   . Depression   . Dyspnea   . GERD (gastroesophageal reflux disease)   . Headache   . Heart murmur    on heard when pt is lying  . Other and unspecified hyperlipidemia   . Tachycardia    d/t questionable anxiety happens every 5- 10 years, sees [facility]   Past Surgical History:  Procedure Laterality Date  . ANTERIOR CERVICAL DECOMP/DISCECTOMY FUSION  2012, 2015, 2018   x3  . AUGMENTATION MAMMAPLASTY Bilateral 1982  . BREAST ENHANCEMENT SURGERY  1981  . CARPAL TUNNEL RELEASE  11/11/2011   Procedure: CARPAL TUNNEL RELEASE;  Surgeon: Floyce Stakes, MD;  Location: [facility] NEURO ORS;  Service: Neurosurgery;  Laterality: Right;  Right Median Nerve Decompression  . CARPAL TUNNEL RELEASE  2013  . COLONOSCOPY WITH PROPOFOL N/A 11/01/2017   Procedure: COLONOSCOPY WITH PROPOFOL;  Surgeon: Manya Silvas, MD;  Location[facility] ENDOSCOPY;  Service: Endoscopy;  Laterality: N/A;  . ESOPHAGOGASTRODUODENOSCOPY    . LUMBAR LAMINECTOMY/DECOMPRESSION MICRODISCECTOMY N/A 08/15/2019   Procedure: Lumbar three to Sacral one Decompressive lumbar laminectomy;  Surgeon: Erline Levine, MD;  Location: [facility];  Service: Neurosurgery;  Laterality: N/A;  . neck disc surgery     plates and screws in neck x 2  .  ROTATOR CUFF REPAIR  06/16/2016   right shoulder   . ROTATOR CUFF REPAIR    . TONSILLECTOMY AND ADENOIDECTOMY  1973     Allergies  Allergies  Allergen Reactions  . Plaquenil [Hydroxychloroquine] Rash    History of Present Illness    Gabrielle Pineda is a 65 y.o. female with a hx of tobacco use, hyperlipidemia, tachycardia/PSVT, chronic pain, hypertension, RA, COPD last seen 12/2019 by Dr. Rockey Situ.  She presents today for hospital follow up after being admitted to [facility] for acute hypoxemic respiratory failure due to COPD exacerbation and flu. She received nebulizers, abx, prednisone. Ona dmission she was noted to have prolonged QTc 667ms in setting of hypokalemia <3 and several QT prolonging medications. She did have SVT which was treated with IV metoprolol and carotid massage. Her diltiazem was changed to carvedilol. Echo 11/01/20 with LVEF >55%, mild TR.  She presents today for follow up. Reports feeling overall well since hospital discharge. She has a few days of prednisone left. Saw PCP 11/08/20 for follow up after hospital discharge with K 4.3. Tells me she takes Furosemide once every 2-3 weeks for swelling and takes her potassium tablet with it. Has not required since discharge. Checking BP at home and tells me readings have been good.   Reports no shortness of breath at rest. Endorses stable dyspnea on exertion. Reports no chest pain, pressure, or tightness. No edema, orthopnea, PND. Reports no palpitations.  EKGs/Labs/Other Studies Reviewed:   The following studies were reviewed today:  Echo 11/01/20  1. The left ventricle is normal in size with normal wall thickness.    2. The left ventricular systolic function is normal, LVEF is visually  estimated at > 55%.    3. The right ventricle is normal in size, with normal systolic function.    4. There is mild tricuspid regurgitation.    5. There is no pulmonary hypertension, estimated pulmonary artery systolic  pressure is 33 mmHg.     6. Technically difficult study due to chest wall/lung interference.    7. Echo contrast utilized to enhance endocardial border definition.   EKG:  EKG is  ordered today.  The ekg ordered today demonstrates NSR 68 bpm with occasional PVC.  Recent Labs: 04/19/2020: ALT 25; Hemoglobin 12.4; Platelets 212 11/08/2020: BUN 7; Creatinine, Ser 0.96; Potassium 4.3; Sodium 140  Recent Lipid Panel    Component Value Date/Time   CHOL 172 04/19/2020 1026   TRIG 102 04/19/2020 1026   HDL 56 04/19/2020 1026   CHOLHDL 3.1 04/19/2020 1026   LDLCALC 98 04/19/2020 1026    Home Medications   Current Meds  Medication Sig  . acyclovir ointment (ZOVIRAX) 5 % Apply 1 application topically every 3 (three) hours.  . ARIPiprazole (ABILIFY) 10 MG tablet Take 1 tablet (10 mg total) by mouth daily.  Marland Kitchen atorvastatin (LIPITOR) 40 MG tablet Take 1 tablet (40 mg total) by mouth daily.  . busPIRone (BUSPAR) 30 MG tablet Take 1 tablet (30 mg total) by mouth 3 (three) times daily.  . Eszopiclone 3 MG TABS Take 1 tablet (3 mg total) by mouth at bedtime. Take immediately before bedtime  . ezetimibe (ZETIA) 10 MG tablet Take 1 tablet (10 mg total) by mouth daily.  . fluticasone (FLONASE) 50 MCG/ACT nasal spray Place 2 sprays into both nostrils daily.  . Fluticasone-Umeclidin-Vilant (TRELEGY ELLIPTA) 100-62.5-25 MCG/INH AEPB Inhale 1 puff into the lungs daily.  . furosemide (LASIX) 20 MG tablet TAKE 1 TABLET BY MOUTH ONCE DAILY AS NEEDED FOR FLUID  . gabapentin (NEURONTIN) 600 MG tablet Take 1 tablet (600 mg total) by mouth 3 (three) times daily.  . Guaifenesin 1200 MG TB12 Take 1,200 mg by mouth daily.  Marland Kitchen ibuprofen (ADVIL) 200 MG tablet Take 3 tablets (600 mg total) by mouth every 8 (eight) hours as needed. For pain  . ipratropium (ATROVENT) 0.02 % nebulizer solution Take 2.5 mLs (0.5 mg total) by nebulization 4 (four) times daily as needed for wheezing or shortness of breath.  . leflunomide (ARAVA) 10 MG tablet Take 10 mg  by mouth at bedtime.   Marland Kitchen LINZESS 145 MCG CAPS capsule Take 145 mcg by mouth daily before breakfast.  . meloxicam (MOBIC) 15 MG tablet Take 1 tablet (15 mg total) by mouth at bedtime.  . Multiple Vitamin (MULTIVITAMIN WITH MINERALS) TABS tablet Take 1 tablet by mouth daily.  . Nutritional Supplements (ESTROVEN ENERGY PO) Take 1 tablet by mouth daily.   Marland Kitchen omeprazole (PRILOSEC) 40 MG capsule Take 1 capsule (40 mg total) by mouth 2 (two) times daily.  Marland Kitchen oxyCODONE (ROXICODONE) 5 MG immediate release tablet Take 1 tablet (5 mg total) by mouth every 4 (four) hours as needed for severe pain.  . potassium chloride SA (KLOR-CON) 20 MEQ tablet Take 1 tablet (20 mEq total) by mouth daily as needed (take with furosemide (fluid retention)).  . predniSONE (DELTASONE) 10 MG tablet Take 6 tablets PO on day 1 and  day 2, take 5 tablets PO on day 3 and day 4, take 4 tablets PO on day 5 and day 6, take 3 tablets PO on day 7 and day 8, take 2 tablets PO on day 9 and day 10, take one tablet PO on day 11 and day 12.  . sertraline (ZOLOFT) 100 MG tablet Take 1 tablet (100 mg total) by mouth daily.  Marland Kitchen topiramate (TOPAMAX) 25 MG tablet Take 1 tablet (25 mg total) by mouth 2 (two) times daily.  . valACYclovir (VALTREX) 1000 MG tablet TAKE 2 TABLETS BY MOUTH TWICE DAILY AS NEEDED FOR 5 DAYS  . venlafaxine XR (EFFEXOR-XR) 75 MG 24 hr capsule TAKE 3 CAPSULES BY MOUTH ONCE DAILY WITH BREAKFAST  . vitamin B-12 (CYANOCOBALAMIN) 1000 MCG tablet Take 6,000 mcg by mouth daily.  . vitamin E 400 UNIT capsule Take 1,600 Units by mouth daily.  Marland Kitchen XIIDRA 5 % SOLN Place 1 drop into both eyes daily.  . [DISCONTINUED] carvedilol (COREG) 12.5 MG tablet Take by mouth.     Review of Systems  All other systems reviewed and are otherwise negative except as noted above.  Physical Exam    VS:  BP 110/78   Pulse 68   Ht 5\' 9"  (1.753 m)   Wt 148 lb (67.1 kg)   BMI 21.86 kg/m  , BMI Body mass index is 21.86 kg/m.  Wt Readings from Last 3  Encounters:  11/20/20 148 lb (67.1 kg)  11/08/20 143 lb (64.9 kg)  06/03/20 153 lb (69.4 kg)    GEN: Well nourished, well developed, in no acute distress. HEENT: normal. Neck: Supple, no JVD, carotid bruits, or masses. Cardiac: RRR, no murmurs, rubs, or gallops. No clubbing, cyanosis, edema.  Radials/PT 2+ and equal bilaterally.  Respiratory:  Respirations regular and unlabored, clear to auscultation bilaterally. GI: Soft, nontender, nondistended. MS: No deformity or atrophy. Skin: Warm and dry, no rash. Neuro:  Strength and sensation are intact. Psych: Normal affect.  Assessment & Plan    1. HTN - BP well controlled. Continue current antihypertensive regimen.   2. Palpitations / Tachycardia / PSVT / PVC- Echo 10/2020 with normal LVEF and mild TR. EKG today with occasional PVC which she is unaware of. No recurrent palpitations since transition from Diltiazem to Coreg during recent hospitalization. Continue Coreg 12.5mg  BID, refill provided.  3. HLD - Continue Atorvastatin 40mg  daily per PCP  4. Tobacco use - Smoking cessation encouraged. Recommend utilization of [id number].  5. COPD - Completing course of prednisone after recent admission with exacerbation. Continue to follow with PCP.   Disposition: Follow up in 1 year(s) with Dr. Rockey Situ or APP   Signed, Loel Dubonnet, NP 11/20/2020, 12:33 PM [place]

## 2020-11-20 NOTE — Patient Instructions (Signed)
Medication Instructions:  Continue your current medications.   *If you need a refill on your cardiac medications before your next appointment, please call your pharmacy*   Lab Work: None ordered today.  Testing/Procedures: Your EKG today showed normal sinus rhythm with an occasional early beat called a PVC that is not dangerous.    Follow-Up: At [facility], you and your health needs are our priority.  As part of our continuing mission to provide you with exceptional heart care, we have created designated Provider Care Teams.  These Care Teams include your primary Cardiologist (physician) and Advanced Practice Providers (APPs -  Physician Assistants and Nurse Practitioners) who all work together to provide you with the care you need, when you need it.  We recommend signing up for the patient portal called "MyChart".  Sign up information is provided on this After Visit Summary.  MyChart is used to connect with patients for Virtual Visits (Telemedicine).  Patients are able to view lab/test results, encounter notes, upcoming appointments, etc.  Non-urgent messages can be sent to your provider as well.   To learn more about what you can do with MyChart, go to [email or web address].    Your next appointment:   1 year(s)  The format for your next appointment:   In Person  Provider:   You may see Ida Rogue, MD or one of the following Advanced Practice Providers on your designated Care Team:    Murray Hodgkins, NP  Christell Faith, PA-C  Marrianne Mood, PA-C  Cadence Kathlen Mody, [place]  Laurann Montana, NP  Other Instructions  Heart Healthy Diet Recommendations: A low-salt diet is recommended. Meats should be grilled, baked, or boiled. Avoid fried foods. Focus on lean protein sources like fish or chicken with vegetables and fruits. The American Heart Association is a Microbiologist!  American Heart Association Diet and Lifeystyle Recommendations   Exercise recommendations: The  American Heart Association recommends 150 minutes of moderate intensity exercise weekly. Try 30 minutes of moderate intensity exercise 4-5 times per week. This could include walking, jogging, or swimming.

## 2020-11-21 ENCOUNTER — Telehealth: Payer: Self-pay | Admitting: Student in an Organized Health Care Education/Training Program

## 2020-11-21 NOTE — Telephone Encounter (Signed)
Patient called stating she has been released by Cardiologist to have the SCS Trial. I sent msg to Jocelyn Lamer about calling to set her appt. Up. She is approved until 01-05-21

## 2020-11-22 ENCOUNTER — Telehealth: Payer: Self-pay

## 2020-11-22 NOTE — Progress Notes (Signed)
Spoke to patient to confirmed patient telephone appointment on 11/25/2020 for CCM at 1:00 pm with Junius Argyle the Clinical pharmacist.   Patient Verbalized understanding and denies any side effects from any of her current medications  [place] Pharmacist Assistant [phone]

## 2020-11-24 ENCOUNTER — Encounter: Payer: Self-pay | Admitting: Physician Assistant

## 2020-11-25 ENCOUNTER — Ambulatory Visit (INDEPENDENT_AMBULATORY_CARE_PROVIDER_SITE_OTHER): Payer: PPO

## 2020-11-25 DIAGNOSIS — F321 Major depressive disorder, single episode, moderate: Secondary | ICD-10-CM

## 2020-11-25 DIAGNOSIS — I10 Essential (primary) hypertension: Secondary | ICD-10-CM | POA: Diagnosis not present

## 2020-11-25 DIAGNOSIS — F5101 Primary insomnia: Secondary | ICD-10-CM

## 2020-11-25 DIAGNOSIS — J418 Mixed simple and mucopurulent chronic bronchitis: Secondary | ICD-10-CM

## 2020-11-25 DIAGNOSIS — E7800 Pure hypercholesterolemia, unspecified: Secondary | ICD-10-CM

## 2020-11-25 DIAGNOSIS — F411 Generalized anxiety disorder: Secondary | ICD-10-CM

## 2020-11-25 NOTE — Progress Notes (Signed)
Chronic Care Management Pharmacy Note  11/26/2020 Name:  Gabrielle Pineda MRN:  [id number] DOB:  24-Nov-1955  Subjective: Gabrielle Pineda is an 65 y.o. year old female who is a primary patient of [name], [place].  The CCM team was consulted for assistance with disease management and care coordination needs.    Engaged with patient by telephone for follow up visit in response to provider referral for pharmacy case management and/or care coordination services.   Consent to Services:  The patient was given information about Chronic Care Management services, agreed to services, and gave verbal consent prior to initiation of services.  Please see initial visit note for detailed documentation.   Patient Care Team: [name], PA-C as PCP - General (Family Medicine) Minna Merritts, MD as PCP - Cardiology (Cardiology) Manya Silvas, MD (Inactive) (Gastroenterology) Germaine Pomfret, [facility] (Pharmacist)  Recent office visits: 11/08/20: Patient presented to Fenton Malling, PA-C for hospital follow-up. Diltiazem, hydoxyzine, trazodone stopped. Eszopiclone 3 mg started, prednisone 12 day course started.   Recent consult visits: 11/20/20: Patient presented to Laurann Montana, NP (Cardiology) for follow-up.   Hospital visits: 3/16-3/20: Patient was hospitalized at [facility] for acute respiratory failure and COPD exacerbation. Continue prednisone course for 2 more days. Stopped diltiazem, Started on Carvedilol 12.5 mg twice daily due to tachycardia. QTc 607 ms, holding aripiprazole, trazodone.   Objective:  Lab Results  Component Value Date   CREATININE 0.96 11/08/2020   BUN 7 (L) 11/08/2020   GFRNONAA 62 05/06/2020   GFRAA 72 05/06/2020   NA 140 11/08/2020   K 4.3 11/08/2020   CALCIUM 9.5 11/08/2020   CO2 20 11/08/2020   GLUCOSE 101 (H) 11/08/2020    Lab Results  Component Value Date/Time   HGBA1C 5.0 04/14/2018 11:24 AM   HGBA1C 5.2 04/16/2017 11:01 AM     Last diabetic Eye exam: No results found for: HMDIABEYEEXA  Last diabetic Foot exam: No results found for: HMDIABFOOTEX   Lab Results  Component Value Date   CHOL 172 04/19/2020   HDL 56 04/19/2020   LDLCALC 98 04/19/2020   TRIG 102 04/19/2020   CHOLHDL 3.1 04/19/2020    Hepatic Function Latest Ref Rng & Units 05/06/2020 04/19/2020 08/09/2019  Total Protein 6.0 - 8.5 g/dL - 6.4 6.3(L)  Albumin 3.8 - 4.8 g/dL 4.2 4.3 4.0  AST 0 - 40 IU/L - 28 24  ALT 0 - 32 IU/L - 25 19  Alk Phosphatase 48 - 121 IU/L - 115 101  Total Bilirubin 0.0 - 1.2 mg/dL - <0.2 0.3  Bilirubin, Direct 0.00 - 0.40 mg/dL - - -    Lab Results  Component Value Date/Time   TSH 1.310 04/17/2019 10:37 AM   TSH 1.700 04/14/2018 11:24 AM    CBC Latest Ref Rng & Units 04/19/2020 08/09/2019 04/17/2019  WBC 3.4 - 10.8 x10E3/uL 6.7 6.7 5.6  Hemoglobin 11.1 - 15.9 g/dL 12.4 12.8 12.0  Hematocrit 34.0 - 46.6 % 36.8 39.6 36.4  Platelets 150 - 450 x10E3/uL 212 188 230    No results found for: VD25OH  Clinical ASCVD: No  The 10-year ASCVD risk score Mikey Bussing DC Jr., et al., 2013) is: 8.5%   Values used to calculate the score:     Age: 57 years     Sex: Female     Is Non-Hispanic African American: No     Diabetic: No     Tobacco smoker: Yes     Systolic Blood Pressure:  110 mmHg     Is BP treated: Yes     HDL Cholesterol: 56 mg/dL     Total Cholesterol: 172 mg/dL    Depression screen [facility] 2/9 11/08/2020 08/23/2020 06/03/2020  Decreased Interest 0 0 0  Down, Depressed, Hopeless 0 0 0  PHQ - 2 Score 0 0 0  Altered sleeping 0 - -  Tired, decreased energy 1 - -  Change in appetite 0 - -  Feeling bad or failure about yourself  0 - -  Trouble concentrating 0 - -  Moving slowly or fidgety/restless 0 - -  Suicidal thoughts 0 - -  PHQ-9 Score 1 - -  Difficult doing work/chores Not difficult at all - -  Some recent data might be hidden    Social History   Tobacco Use  Smoking Status Former Smoker  . Packs/day: 1.00  .  Years: 40.00  . Pack years: 40.00  . Quit date: 10/30/2020  . Years since quitting: 0.0  Smokeless Tobacco Never Used   BP Readings from Last 3 Encounters:  11/20/20 110/78  11/08/20 114/76  06/03/20 139/87   Pulse Readings from Last 3 Encounters:  11/20/20 68  11/08/20 76  06/03/20 83   Wt Readings from Last 3 Encounters:  11/20/20 148 lb (67.1 kg)  11/08/20 143 lb (64.9 kg)  06/03/20 153 lb (69.4 kg)   BMI Readings from Last 3 Encounters:  11/20/20 21.86 kg/m  11/08/20 21.12 kg/m  06/03/20 22.59 kg/m    Assessment/Interventions: Review of patient past medical history, allergies, medications, health status, including review of consultants reports, laboratory and other test data, was performed as part of comprehensive evaluation and provision of chronic care management services.   SDOH:  (Social Determinants of Health) assessments and interventions performed: Yes SDOH Interventions   Flowsheet Row Most Recent Value  SDOH Interventions   Financial Strain Interventions Intervention Not Indicated     SDOH Screenings   Alcohol Screen: Low Risk   . Last Alcohol Screening Score (AUDIT): 0  Depression (PHQ2-9): Low Risk   . PHQ-2 Score: 1  Financial Resource Strain: Medium Risk  . Difficulty of Paying Living Expenses: Somewhat hard  Food Insecurity: Not on file  Housing: Not on file  Physical Activity: Not on file  Social Connections: Not on file  Stress: Not on file  Tobacco Use: Medium Risk  . Smoking Tobacco Use: Former Smoker  . Smokeless Tobacco Use: Never Used  Transportation Needs: No Transportation Needs  . Lack of Transportation (Medical): No  . Lack of Transportation (Non-Medical): No    CCM Care Plan  Allergies  Allergen Reactions  . Plaquenil [Hydroxychloroquine] Rash    Medications Reviewed Today    Reviewed by Germaine Pomfret, [facility] (Pharmacist) on 11/25/20 at 1311  Med List Status: <None>  Medication Order Taking? Sig Documenting Provider  Last Dose Status Informant  acyclovir ointment (ZOVIRAX) 5 % [id number]  Apply 1 application topically every 3 (three) hours. Fenton Malling M, PA-C  Active   ARIPiprazole (ABILIFY) 10 MG tablet [id number]  Take 1 tablet (10 mg total) by mouth daily. Fenton Malling M, PA-C  Active   atorvastatin (LIPITOR) 40 MG tablet [id number]  Take 1 tablet (40 mg total) by mouth daily. Fenton Malling M, PA-C  Active   busPIRone (BUSPAR) 30 MG tablet [id number]  Take 1 tablet (30 mg total) by mouth 3 (three) times daily. [name], [place]  Active   carvedilol (COREG) 12.5 MG tablet [id number]  Take  1 tablet (12.5 mg total) by mouth 2 (two) times daily with a meal. Loel Dubonnet, NP  Active   Eszopiclone 3 MG TABS [id number]  Take 1 tablet (3 mg total) by mouth at bedtime. Take immediately before bedtime Fenton Malling M, [place]  Active   ezetimibe (ZETIA) 10 MG tablet [id number]  Take 1 tablet (10 mg total) by mouth daily. Fenton Malling M, PA-C  Active   fluticasone [facility]) 50 MCG/ACT nasal spray [id number]  Place 2 sprays into both nostrils daily. [name], PA-C  Active Self  Fluticasone-Umeclidin-Vilant (TRELEGY ELLIPTA) 100-62.5-25 MCG/INH AEPB [id number] No Inhale 1 puff into the lungs daily.  Patient not taking: Reported on 11/25/2020   [name], PA-C Not Taking Active   furosemide (LASIX) 20 MG tablet [id number]  TAKE 1 TABLET BY MOUTH ONCE DAILY AS NEEDED FOR FLUID [name], PA-C  Active   gabapentin (NEURONTIN) 600 MG tablet [id number]  Take 1 tablet (600 mg total) by mouth 3 (three) times daily. [name], MD  Active   Guaifenesin 1200 MG TB12 [id number]  Take 1,200 mg by mouth daily. [provider]  Active Self  ibuprofen (ADVIL) 200 MG tablet [id number]  Take 3 tablets (600 mg total) by mouth every 8 (eight) hours as needed. For pain Costella, Vista Mink, PA-C  Active   ipratropium (ATROVENT) 0.02 % nebulizer solution [id number]  Take  2.5 mLs (0.5 mg total) by nebulization 4 (four) times daily as needed for wheezing or shortness of breath. Fenton Malling M, PA-C  Active   leflunomide (ARAVA) 10 MG tablet [id number]  Take 10 mg by mouth at bedtime.  [provider]  Active Self  LINZESS 145 MCG CAPS capsule [id number]  Take 145 mcg by mouth daily before breakfast. [provider]  Active Self  meloxicam (MOBIC) 15 MG tablet [id number]  Take 1 tablet (15 mg total) by mouth at bedtime. Costella, Vista Mink, PA-C  Active   Multiple Vitamin (MULTIVITAMIN WITH MINERALS) TABS tablet [id number]  Take 1 tablet by mouth daily. [provider]  Active Self  Nutritional Supplements (ESTROVEN ENERGY PO) [id number]  Take 1 tablet by mouth daily.  [provider]  Active Self  omeprazole (PRILOSEC) 40 MG capsule [id number]  Take 1 capsule (40 mg total) by mouth 2 (two) times daily. Fenton Malling M, PA-C  Active   oxyCODONE (ROXICODONE) 5 MG immediate release tablet [id number]  Take 1 tablet (5 mg total) by mouth every 4 (four) hours as needed for severe pain. Costella, Vista Mink, PA-C  Active   potassium chloride SA (KLOR-CON) 20 MEQ tablet [id number]  Take 1 tablet (20 mEq total) by mouth daily as needed (take with furosemide (fluid retention)). Fenton Malling M, PA-C  Active   predniSONE (DELTASONE) 10 MG tablet [id number]  Take 6 tablets PO on day 1 and day 2, take 5 tablets PO on day 3 and day 4, take 4 tablets PO on day 5 and day 6, take 3 tablets PO on day 7 and day 8, take 2 tablets PO on day 9 and day 10, take one tablet PO on day 11 and day 12. Fenton Malling M, PA-C  Active   sertraline (ZOLOFT) 100 MG tablet [id number]  Take 1 tablet (100 mg total) by mouth daily. Fenton Malling M, PA-C  Active   topiramate (TOPAMAX) 25 MG tablet [id number]  Take 1 tablet (25 mg total) by mouth 2 (two) times daily. [name], MD  Active  valACYclovir (VALTREX) 1000 MG tablet [id number]  TAKE 2 TABLETS BY  MOUTH TWICE DAILY AS NEEDED FOR 5 DAYS [name], [place]  Active   venlafaxine XR (EFFEXOR-XR) 75 MG 24 hr capsule [id number]  TAKE 3 CAPSULES BY MOUTH ONCE DAILY WITH BREAKFAST [name], PA-C  Active   vitamin B-12 (CYANOCOBALAMIN) 1000 MCG tablet [id number]  Take 6,000 mcg by mouth daily. [provider]  Active Self  vitamin E 400 UNIT capsule [id number]  Take 1,600 Units by mouth daily. [provider]  Active Self  XIIDRA 5 % SOLN [id number]  Place 1 drop into both eyes daily. [provider]  Active Self  Med List Note Janett Billow, RN 09/07/17 1420): UDS 09/07/17          Patient Active Problem List   Diagnosis Date Noted  . Chronic radicular lumbar pain 10/03/2020  . Postlaminectomy syndrome, lumbar region 06/03/2020  . Acute non-recurrent pansinusitis 03/12/2020  . Moderate episode of recurrent major depressive disorder ([place]) 12/04/2019  . Spondylosis of cervical region without myelopathy or radiculopathy 09/21/2019  . DDD (degenerative disc disease), cervical 09/21/2019  . Cervicalgia 09/21/2019  . Cervical fusion syndrome 09/21/2019  . Chronic pain syndrome 09/21/2019  . Spinal stenosis, lumbar region, with neurogenic claudication 08/15/2019  . History of adenomatous polyp of colon 11/04/2017  . Cervical radiculopathy 08/02/2017  . Neuropathy 08/02/2017  . Essential hypertension 07/28/2017  . Closed compression fracture of L5 lumbar vertebra 07/07/2017  . Sacral insufficiency fracture with routine healing 07/07/2017  . COPD (chronic obstructive pulmonary disease) ([place]) 04/10/2015  . Family history of malignant neoplasm of pancreas 04/03/2015  . Hypercholesteremia 04/03/2015  . Disorder of iron metabolism 04/03/2015  . Weight loss 04/03/2015  . Delayed onset of urination 04/03/2015  . Acid reflux 10/11/2014  . Closed fracture of distal phalanx of thumb 08/15/2014  . Arthritis, degenerative 01/30/2014  . Arthritis or  polyarthritis, rheumatoid ([place]) 01/30/2014  . Shortness of breath 12/22/2013  . SMOKER 09/22/2010  . Paroxysmal supraventricular tachycardia ([place]) 09/22/2010  . CHEST PAIN UNSPECIFIED 09/22/2010  . ELECTROCARDIOGRAM, ABNORMAL 09/22/2010  . B-complex deficiency 09/09/2007  . CN (constipation) 06/26/2007  . Clinical depression 06/26/2007  . Cold sore 06/26/2007  . H/O alcohol abuse 06/26/2007  . Cannot sleep 06/26/2007  . Localized osteoarthrosis, hand 06/26/2007  . Menopausal symptom 06/26/2007    Immunization History  Administered Date(s) Administered  . Influenza Split 09/07/2012  . Influenza,inj,Quad PF,6+ Mos 11/18/2015, 07/23/2016, 07/02/2017, 04/14/2018, 04/17/2019, 04/19/2020  . Influenza-Unspecified 09/24/2014  . PFIZER(Purple Top)SARS-COV-2 Vaccination 11/21/2019, 12/12/2019  . Pneumococcal Polysaccharide-23 02/12/2011, 09/24/2014, 04/19/2020  . Tdap 02/12/2011, 11/15/2015    Conditions to be addressed/monitored:  Hypertension, Hyperlipidemia, COPD, Depression, Tobacco use and Chronic Pain, and Rheumatoid Arthritis  Care Plan : General Pharmacy (Adult)  Updates made by Germaine Pomfret, RPH since 11/26/2020 12:00 AM    Problem: Hypertension, Hyperlipidemia, COPD, Depression, Tobacco use and Chronic Pain, and Rheumatoid Arthritis   Priority: High    Long-Range Goal: Patient-Specific Goal   Start Date: 11/25/2020  Expected End Date: 05/28/2021  This Visit's Progress: On track  Priority: High  Note:   Current Barriers:  . Unable to independently afford treatment regimen . Unable to achieve control of COPD  . Suboptimal therapeutic regimen for depression  Pharmacist Clinical Goal(s):  Marland Kitchen Patient will verbalize ability to afford treatment regimen . achieve control of COPD as evidenced by stable breathing, avoiding cigarette use, and absence of exacerbations . adhere to plan  to optimize therapeutic regimen for depression as evidenced by report of adherence to  recommended medication management changes through collaboration with PharmD and provider.   Interventions: . 1:1 collaboration with [name], PA-C regarding development and update of comprehensive plan of care as evidenced by provider attestation and co-signature . Inter-disciplinary care team collaboration (see longitudinal plan of care) . Comprehensive medication review performed; medication list updated in electronic medical record  Hypertension (BP goal <140/90) -Controlled -Current treatment: . Carvedilol 12.5 mg twice daily  . Furosemide 20 mg daily as needed  -Medications previously tried: NA  -Current home readings: NA -Denies hypotensive/hypertensive symptoms -Patient does report increased fatigue and lethargy since starting carvedilol, although patient does report poor sleep quality -Educated on Daily salt intake goal < 2300 mg; Exercise goal of 150 minutes per week; -Counseled to monitor BP at home weekly, document, and provide log at future appointments -Recommended to continue current medication  Hyperlipidemia: (LDL goal < 70) -Controlled -Current treatment: . Atorvastatin 40 mg daily  . Ezetimibe 10 mg daily  -Medications previously tried: NA  -Educated on Benefits of statin for ASCVD risk reduction; -Recommended to continue current medication  COPD (Goal: control symptoms and prevent exacerbations) -Controlled -Current treatment  . Ipratropium 0.02% nebulizer 2.5 mL every 4 hours . Trelegy 1 puff daily  -Medications previously tried: NA  -Gold Grade: Gold 2 (FEV1 50-79%) -Current COPD Classification:  B (high sx, <2 exacerbations/yr) -MMRC/CAT score: NA -Pulmonary function testing: 71% (2017) -Exacerbations requiring treatment in last 6 months: yes -Patient denies consistent use of maintenance inhaler. Patient reports she has been struggling to take deep enough breaths for her Trelegy, so has stopped using it all together.  -Counseled on Proper  inhaler technique; Benefits of consistent maintenance inhaler use; instructed patient to take as deep breaths possible and to take multiple breaths of inhaler if needed to inhale all the medication.  -Recommended to continue current medication Assessed patient finances. Trelegy patient assistance was denied  Depression/Anxiety (Goal: Maintain stable mood) -Not ideally controlled -Current treatment: . Aripiprazole 10 mg daily  . Buspirone 30 mg  . Eszopiclone 3 mg at bedtime  . Sertraline 100 mg daily  . Venlafaxine XR 75 mg 3 capsules daily -Medications previously tried/failed: NA -PHQ9: 1 -GAD7: 9 -Getting 5-6 hours sleep currently. Wakes up 3 times nightly. Sometimes struggles to fall back asleep.  -Connected with NA for mental health support -Educated on Benefits of medication for symptom control -Recommended stopping sertraline, optimizing Venlafaxine due to overlap in MOA increasing risk of serotonin syndrome and QTc prolongation   Tobacco use (Goal quit smoking) -Controlled -Previous quit attempts: NA -Current treatment  . Nicotine Patch 21 mg - Started 3 weeks  -Patient smokes After 30 minutes of waking -Patient triggers include: finishing a meal  -Patient continues to have triggers to smoke, but so far has been motivated to stay away from it due to her lung condition -Counseled on patch placement, side effects, and option to remove at night if they experience trouble sleeping or bad dreams.   -Counseled patient on importance of long-term treatment to prevent relapse -Recommended decreasing Nicotine to 14 mg/24 hr patches in 3 weeks  Patient Goals/Self-Care Activities . Patient will:  - check blood pressure weekly, document, and provide at future appointments Utilize maintenance inhaler daily  Follow Up Plan: Telephone follow up appointment with care management team member scheduled for:  02/25/2021 at 2:00 PM     Medication Assistance: Application for Trelegy  denied  Patient's preferred pharmacy is:  [facility] - [place], [place] - [address] [place] [address] Phone: [phone] Fax: [phone]  Patient decided to: Utilize [facility] for medication synchronization, packaging and delivery   Patient obtains medications through Adherence Packaging  30 Days   Last adherence delivery included:  Aripiprazole 10 mg tablet Take one tablet by mouth daily (breakfast)  Atorvastatin 40 mg tablet- Take one tablet by mouth daily (breakfast)  Buspirone 30 mg tablet- Take one tablet by mouth three times daily (breakfast, lunch, bedtime)  Diltiazem Cd 180 mg capsule ER- Take one tablet by mouth daily (breakfast)  Ezetimibe 10 mg tablet Take one tablet by mouth daily (breakfast)  Gabapentin 600 mg tablet- Take one tablet by mouth three times daily (breakfast, lunch bedtime)  Leflunomide 10 mg tablet- Take one tablet by mouth once daily (bedtime)  Linzess 145 mcg- Take one tablet by mouth daily (before breakfast)  Meloxicam 15 mg tablet- Take one tablet by mouth once daily (bedtime)   Omeprazole 40 my- Take one capsule by mouth two times a day (breakfast, bedtime)  Sertraline 100 mg tablet- Take one tablet by mouth daily (bedtime)  Topiramate 25 mg tablet-Take one tablet by mouth twice daily (breakfast, bedtime)   Trazodone 100 mg tablet Take three tablets by mouth everyday (bedtime)  Venlafaxine ER 75 mg capsule- Take three capsules by mouth daily (breakfast)  Trelegy151mgInhale 1 puff into the lungs daily  Patient is due for next adherence delivery on: 12/02/2020. Called patient and reviewed medications and coordinated delivery.  This delivery to include: Packs:  Aripiprazole 10 mg tablet- Take one tablet by mouth daily (breakfast)  Atorvastatin 40 mg tablet- Take one tablet by mouth daily (breakfast)  Buspirone 30 mg tablet- Take one tablet by mouth three  times daily (breakfast, lunch, bedtime)  Carvedilol 12.5 mg tablet - Take on tablet by mouth twice daily (breakfast, bedtime)   Ezetimibe 10 mg tablet Take one tablet by mouth daily (breakfast)  Eszopiclone 3 mg tablet - Take one tablet by mouth nightly (bedtime)  Gabapentin 600 mg tablet- Take one tablet by mouth three times daily (breakfast, lunch bedtime)  Leflunomide 10 mg tablet- Take one tablet by mouth once daily (bedtime)  Linzess 145 mcg- Take one tablet by mouth daily (before breakfast)  Meloxicam 15 mg tablet- Take one tablet by mouth once daily (bedtime)   Omeprazole 40 my- Take one capsule by mouth two times a day (breakfast, bedtime)  Potassium 20 mEq - Take one capsule daily (breakfast)   Sertraline 100 mg tablet- Take one tablet by mouth daily (bedtime)  Topiramate 25 mg tablet-Take one tablet by mouth twice daily (breakfast, bedtime)   Venlafaxine ER 75 mg capsule- Take three capsules by mouth daily (breakfast)  Patient declined the following medications (meds) due to (reason)  Ipratropium Bromide 0.02 solution- Inhale 2.558m via nebulizer four times daily (PRN)  Furosemide 20 mg tablet-Take one tablet by mouth once daily (PRN)  Potassium Cl Er 20 Meq tablet- Take one tablet by mouth daily (take with furosemide) (prn)  Trelegy- Stopped taking daily for some time, so had adequate supply.   Patient needs refills for Arirpiprazole, Buspirone.  Confirmed delivery date of 12/02/20, advised patient that pharmacy will contact them the morning of delivery.  Care Plan and Follow Up Patient Decision:  Patient agrees to Care Plan and Follow-up.  Plan: Telephone follow up appointment with care management team member scheduled for:  02/25/2021 at 2:00 PM  Junius Argyle, PharmD, [facility] [phone]

## 2020-11-26 ENCOUNTER — Telehealth: Payer: Self-pay

## 2020-11-26 NOTE — Patient Instructions (Signed)
Visit Information It was great speaking with you today!  Please let me know if you have any questions about our visit.  Goals Addressed            This Visit's Progress   . Track and Manage My Triggers-COPD       Timeframe:  Long-Range Goal Priority:  High Start Date: 11/25/2020                            Expected End Date:  05/27/2021                     Follow Up Date 12/25/2020   - avoid second hand smoke - eliminate smoking in my home - identify and remove indoor air pollutants - limit outdoor activity during cold weather    Why is this important?    Triggers are activities or things, like tobacco smoke or cold weather, that make your COPD (chronic obstructive pulmonary disease) flare-up.   Knowing these triggers helps you plan how to stay away from them.   When you cannot remove them, you can learn how to manage them.     Notes:        Patient Care Plan: General Pharmacy (Adult)    Problem Identified: Hypertension, Hyperlipidemia, COPD, Depression, Tobacco use and Chronic Pain, and Rheumatoid Arthritis   Priority: High    Long-Range Goal: Patient-Specific Goal   Start Date: 11/25/2020  Expected End Date: 05/28/2021  This Visit's Progress: On track  Priority: High  Note:   Current Barriers:  . Unable to independently afford treatment regimen . Unable to achieve control of COPD  . Suboptimal therapeutic regimen for depression  Pharmacist Clinical Goal(s):  Marland Kitchen Patient will verbalize ability to afford treatment regimen . achieve control of COPD as evidenced by stable breathing, avoiding cigarette use, and absence of exacerbations . adhere to plan to optimize therapeutic regimen for depression as evidenced by report of adherence to recommended medication management changes through collaboration with PharmD and provider.   Interventions: . 1:1 collaboration with [name], PA-C regarding development and update of comprehensive plan of care as evidenced  by provider attestation and co-signature . Inter-disciplinary care team collaboration (see longitudinal plan of care) . Comprehensive medication review performed; medication list updated in electronic medical record  Hypertension (BP goal <140/90) -Controlled -Current treatment: . Carvedilol 12.5 mg twice daily  . Furosemide 20 mg daily as needed  -Medications previously tried: NA  -Current home readings: NA -Denies hypotensive/hypertensive symptoms -Patient does report increased fatigue and lethargy since starting carvedilol, although patient does report poor sleep quality -Educated on Daily salt intake goal < 2300 mg; Exercise goal of 150 minutes per week; -Counseled to monitor BP at home weekly, document, and provide log at future appointments -Recommended to continue current medication  Hyperlipidemia: (LDL goal < 70) -Controlled -Current treatment: . Atorvastatin 40 mg daily  . Ezetimibe 10 mg daily  -Medications previously tried: NA  -Educated on Benefits of statin for ASCVD risk reduction; -Recommended to continue current medication  COPD (Goal: control symptoms and prevent exacerbations) -Controlled -Current treatment  . Ipratropium 0.02% nebulizer 2.5 mL every 4 hours . Trelegy 1 puff daily  -Medications previously tried: NA  -Gold Grade: Gold 2 (FEV1 50-79%) -Current COPD Classification:  B (high sx, <2 exacerbations/yr) -MMRC/CAT score: NA -Pulmonary function testing: 71% (2017) -Exacerbations requiring treatment in last 6 months: yes -Patient denies consistent use of  maintenance inhaler. Patient reports she has been struggling to take deep enough breaths for her Trelegy, so has stopped using it all together.  -Counseled on Proper inhaler technique; Benefits of consistent maintenance inhaler use; instructed patient to take as deep breaths possible and to take multiple breaths of inhaler if needed to inhale all the medication.  -Recommended to continue current  medication Assessed patient finances. Trelegy patient assistance was denied  Depression/Anxiety (Goal: Maintain stable mood) -Not ideally controlled -Current treatment: . Aripiprazole 10 mg daily  . Buspirone 30 mg  . Eszopiclone 3 mg at bedtime  . Sertraline 100 mg daily  . Venlafaxine XR 75 mg 3 capsules daily -Medications previously tried/failed: NA -PHQ9: 1 -GAD7: 9 -Getting 5-6 hours sleep currently. Wakes up 3 times nightly. Sometimes struggles to fall back asleep.  -Connected with NA for mental health support -Educated on Benefits of medication for symptom control -Recommended stopping sertraline, optimizing Venlafaxine due to overlap in MOA increasing risk of serotonin syndrome and QTc prolongation   Tobacco use (Goal quit smoking) -Controlled -Previous quit attempts: NA -Current treatment  . Nicotine Patch 21 mg - Started 3 weeks  -Patient smokes After 30 minutes of waking -Patient triggers include: finishing a meal  -Patient continues to have triggers to smoke, but so far has been motivated to stay away from it due to her lung condition -Counseled on patch placement, side effects, and option to remove at night if they experience trouble sleeping or bad dreams.   -Counseled patient on importance of long-term treatment to prevent relapse -Recommended decreasing Nicotine to 14 mg/24 hr patches in 3 weeks  Patient Goals/Self-Care Activities . Patient will:  - check blood pressure weekly, document, and provide at future appointments Utilize maintenance inhaler daily  Follow Up Plan: Telephone follow up appointment with care management team member scheduled for:  02/25/2021 at 2:00 PM      Patient agreed to services and verbal consent obtained.   The patient verbalized understanding of instructions, educational materials, and care plan provided today and declined offer to receive copy of patient instructions, educational materials, and care plan.   Junius Argyle, PharmD,  [place] [phone]

## 2020-11-26 NOTE — Telephone Encounter (Signed)
Pharmacy requesting     [facility] - [place], [place] - [address] Phone:  [phone]  Fax:  [phone]

## 2020-11-26 NOTE — Progress Notes (Signed)
Reached out to PCP office on 11/26/2020 to request refill for Aripiprazole 10 mg and Buspar 30 mg [facility].  [facility] Pharmacist Assistant [phone]

## 2020-12-09 ENCOUNTER — Encounter: Payer: Self-pay | Admitting: Family Medicine

## 2020-12-10 ENCOUNTER — Other Ambulatory Visit: Payer: Self-pay

## 2020-12-10 ENCOUNTER — Encounter: Payer: Self-pay | Admitting: Family Medicine

## 2020-12-10 ENCOUNTER — Ambulatory Visit (INDEPENDENT_AMBULATORY_CARE_PROVIDER_SITE_OTHER): Payer: PPO | Admitting: Family Medicine

## 2020-12-10 VITALS — BP 122/69 | HR 62 | Temp 98.4°F | Wt 153.8 lb

## 2020-12-10 DIAGNOSIS — F5101 Primary insomnia: Secondary | ICD-10-CM

## 2020-12-10 NOTE — Progress Notes (Signed)
Established patient visit   Patient: Gabrielle Pineda   DOB: 1956-03-06   65 y.o. Female  MRN: [id number] Visit Date: 12/10/2020  Today's healthcare provider: Vernie Murders, PA-C   Chief Complaint  Patient presents with  . Acute respiratory failure with hypoxia   I,Porsha C McClurkin,acting as a Education administrator for [employer], PA-C.,have documented all relevant documentation on the behalf of [employer], PA-C,as directed by  [employer], PA-C while in the presence of [employer], PA-C.  Subjective    HPI  Follow up for Acute respiratory failure with hypoxia  The patient was last seen for this 1 months ago. Changes made at last visit include patient was started on Eszopiclone 3 MG and reports medication was not helping.  She reports good compliance with treatment. She feels that condition is Unchanged. She is not having side effects.   -----------------------------------------------------------------------------------------    Past Medical History:  Diagnosis Date  . Alcohol abuse   . Anemia   . Anxiety   . Arthritis   . Cervicalgia   . Cirrhosis ([place]) 1994  . COPD (chronic obstructive pulmonary disease) ([place])   . Depression   . Dyspnea   . GERD (gastroesophageal reflux disease)   . Headache   . Heart murmur    on heard when pt is lying  . Other and unspecified hyperlipidemia   . Tachycardia    d/t questionable anxiety happens every 5- 10 years, sees [facility]   Past Surgical History:  Procedure Laterality Date  . ANTERIOR CERVICAL DECOMP/DISCECTOMY FUSION  2012, 2015, 2018   x3  . AUGMENTATION MAMMAPLASTY Bilateral 1982  . BREAST ENHANCEMENT SURGERY  1981  . CARPAL TUNNEL RELEASE  11/11/2011   Procedure: CARPAL TUNNEL RELEASE;  Surgeon: Floyce Stakes, MD;  Location: [facility];  Service: Neurosurgery;  Laterality: Right;  Right Median Nerve Decompression  . CARPAL TUNNEL RELEASE  2013  . COLONOSCOPY WITH PROPOFOL N/A 11/01/2017    Procedure: COLONOSCOPY WITH PROPOFOL;  Surgeon: Manya Silvas, MD;  Location: [facility] ENDOSCOPY;  Service: Endoscopy;  Laterality: N/A;  . ESOPHAGOGASTRODUODENOSCOPY    . LUMBAR LAMINECTOMY/DECOMPRESSION MICRODISCECTOMY N/A 08/15/2019   Procedure: Lumbar three to Sacral one Decompressive lumbar laminectomy;  Surgeon: Erline Levine, MD;  Location: [facility];  Service: Neurosurgery;  Laterality: N/A;  . neck disc surgery     plates and screws in neck x 2  . ROTATOR CUFF REPAIR  06/16/2016   right shoulder   . ROTATOR CUFF REPAIR    . TONSILLECTOMY AND ADENOIDECTOMY  1973    Social History   Tobacco Use  . Smoking status: Former Smoker    Packs/day: 1.00    Years: 40.00    Pack years: 40.00    Quit date: 10/30/2020    Years since quitting: 0.1  . Smokeless tobacco: Never Used  Vaping Use  . Vaping Use: Some days  . Devices: vape occasionally  Substance Use Topics  . Alcohol use: No    Comment: recovering alcoholic Since 6962   . Drug use: No   Family History  Problem Relation Age of Onset  . Pancreatic cancer Father   . Alcohol abuse Father   . Alcohol abuse Mother   . Bipolar disorder Mother   . Suicidality Mother   . Anesthesia problems Neg Hx   . Breast cancer Neg Hx    Allergies  Allergen Reactions  . Plaquenil [Hydroxychloroquine] Rash       Medications: Outpatient  Medications Prior to Visit  Medication Sig  . acyclovir ointment (ZOVIRAX) 5 % Apply 1 application topically every 3 (three) hours.  . ARIPiprazole (ABILIFY) 10 MG tablet Take 1 tablet (10 mg total) by mouth daily.  Marland Kitchen atorvastatin (LIPITOR) 40 MG tablet Take 1 tablet (40 mg total) by mouth daily.  . busPIRone (BUSPAR) 30 MG tablet Take 1 tablet (30 mg total) by mouth 3 (three) times daily.  . carvedilol (COREG) 12.5 MG tablet Take 1 tablet (12.5 mg total) by mouth 2 (two) times daily with a meal.  . Eszopiclone 3 MG TABS Take 1 tablet (3 mg total) by mouth at bedtime. Take immediately before bedtime   . ezetimibe (ZETIA) 10 MG tablet Take 1 tablet (10 mg total) by mouth daily.  . fluticasone (FLONASE) 50 MCG/ACT nasal spray Place 2 sprays into both nostrils daily.  . Fluticasone-Umeclidin-Vilant (TRELEGY ELLIPTA) 100-62.5-25 MCG/INH AEPB Inhale 1 puff into the lungs daily.  . furosemide (LASIX) 20 MG tablet TAKE 1 TABLET BY MOUTH ONCE DAILY AS NEEDED FOR FLUID  . gabapentin (NEURONTIN) 600 MG tablet Take 1 tablet (600 mg total) by mouth 3 (three) times daily.  . Guaifenesin 1200 MG TB12 Take 1,200 mg by mouth daily.  Marland Kitchen ibuprofen (ADVIL) 200 MG tablet Take 3 tablets (600 mg total) by mouth every 8 (eight) hours as needed. For pain  . ipratropium (ATROVENT) 0.02 % nebulizer solution Take 2.5 mLs (0.5 mg total) by nebulization 4 (four) times daily as needed for wheezing or shortness of breath.  . leflunomide (ARAVA) 10 MG tablet Take 10 mg by mouth at bedtime.   Marland Kitchen LINZESS 145 MCG CAPS capsule Take 145 mcg by mouth daily before breakfast.  . meloxicam (MOBIC) 15 MG tablet Take 1 tablet (15 mg total) by mouth at bedtime.  . Multiple Vitamin (MULTIVITAMIN WITH MINERALS) TABS tablet Take 1 tablet by mouth daily.  . Nutritional Supplements (ESTROVEN ENERGY PO) Take 1 tablet by mouth daily.   Marland Kitchen omeprazole (PRILOSEC) 40 MG capsule Take 1 capsule (40 mg total) by mouth 2 (two) times daily.  Marland Kitchen oxyCODONE (ROXICODONE) 5 MG immediate release tablet Take 1 tablet (5 mg total) by mouth every 4 (four) hours as needed for severe pain.  . potassium chloride SA (KLOR-CON) 20 MEQ tablet Take 1 tablet (20 mEq total) by mouth daily as needed (take with furosemide (fluid retention)).  Marland Kitchen sertraline (ZOLOFT) 100 MG tablet Take 1 tablet (100 mg total) by mouth daily.  Marland Kitchen topiramate (TOPAMAX) 25 MG tablet Take 1 tablet (25 mg total) by mouth 2 (two) times daily.  . valACYclovir (VALTREX) 1000 MG tablet TAKE 2 TABLETS BY MOUTH TWICE DAILY AS NEEDED FOR 5 DAYS  . venlafaxine XR (EFFEXOR-XR) 75 MG 24 hr capsule TAKE 3  CAPSULES BY MOUTH ONCE DAILY WITH BREAKFAST  . vitamin B-12 (CYANOCOBALAMIN) 1000 MCG tablet Take 6,000 mcg by mouth daily.  . vitamin E 400 UNIT capsule Take 1,600 Units by mouth daily.  Marland Kitchen XIIDRA 5 % SOLN Place 1 drop into both eyes daily.   No facility-administered medications prior to visit.    Review of Systems     Objective    There were no vitals taken for this visit.    Physical Exam Constitutional:      General: She is not in acute distress.    Appearance: She is well-developed.  HENT:     Head: Normocephalic and atraumatic.     Right Ear: Hearing normal.     Left  Ear: Hearing normal.     Nose: Nose normal.  Eyes:     General: Lids are normal. No scleral icterus.       Right eye: No discharge.        Left eye: No discharge.     Conjunctiva/sclera: Conjunctivae normal.  Pulmonary:     Effort: Pulmonary effort is normal. No respiratory distress.  Musculoskeletal:        General: Normal range of motion.  Skin:    Findings: No lesion or rash.  Neurological:     Mental Status: She is alert and oriented to person, place, and time.  Psychiatric:        Mood and Affect: Mood is depressed. Affect is flat.        Speech: Speech normal.        Behavior: Behavior normal.        Thought Content: Thought content normal.        Cognition and Memory: Cognition normal.     No results found for any visits on 12/10/20.  Assessment & Plan    1. Primary insomnia Was sleeping 7-8 hours a night until she stopped the Trazodone (due to QT prolongation effect) and started using Nicotine patches to stop smoking. Lunesta 3 mg hs not much help at the present. Found out she has been using the Nicotine patches continuously. Recommend she take then off at bedtime and given sleep hygiene handout. If no better in a week, will need psychiatry referral to help with depression medication adjustments.   No follow-ups on file.      I, Eldine Rencher, PA-C, have reviewed all documentation  for this visit. The documentation on 12/10/20 for the exam, diagnosis, procedures, and orders are all accurate and complete.    Vernie Murders, PA-C  [employer] [phone] (phone) [phone] (fax)  [place]

## 2020-12-16 ENCOUNTER — Ambulatory Visit: Payer: PPO | Admitting: Family Medicine

## 2020-12-24 ENCOUNTER — Telehealth: Payer: Self-pay

## 2020-12-24 DIAGNOSIS — R339 Retention of urine, unspecified: Secondary | ICD-10-CM

## 2020-12-24 DIAGNOSIS — F321 Major depressive disorder, single episode, moderate: Secondary | ICD-10-CM

## 2020-12-24 DIAGNOSIS — F411 Generalized anxiety disorder: Secondary | ICD-10-CM

## 2020-12-24 NOTE — Progress Notes (Signed)
Chronic Care Management Pharmacy Assistant   Name: Gabrielle Pineda  MRN: [id number] DOB: 12-20-1955   Reason for Encounter: Medication Review/Medication Coordination Call.    Recent office visits:  Simona Huh Chrismon PA-C (PCP Office) - Recommend taken off Nicotine patches at night to help with sleep  Recent consult visits:  No recent [facility] visits:  None in previous 6 months  Medications: Outpatient Encounter Medications as of 12/24/2020  Medication Sig  . acyclovir ointment (ZOVIRAX) 5 % Apply 1 application topically every 3 (three) hours.  . ARIPiprazole (ABILIFY) 10 MG tablet Take 1 tablet (10 mg total) by mouth daily.  Marland Kitchen atorvastatin (LIPITOR) 40 MG tablet Take 1 tablet (40 mg total) by mouth daily.  . busPIRone (BUSPAR) 30 MG tablet Take 1 tablet (30 mg total) by mouth 3 (three) times daily.  . carvedilol (COREG) 12.5 MG tablet Take 1 tablet (12.5 mg total) by mouth 2 (two) times daily with a meal.  . Eszopiclone 3 MG TABS Take 1 tablet (3 mg total) by mouth at bedtime. Take immediately before bedtime  . ezetimibe (ZETIA) 10 MG tablet Take 1 tablet (10 mg total) by mouth daily.  . fluticasone (FLONASE) 50 MCG/ACT nasal spray Place 2 sprays into both nostrils daily.  . Fluticasone-Umeclidin-Vilant (TRELEGY ELLIPTA) 100-62.5-25 MCG/INH AEPB Inhale 1 puff into the lungs daily.  . furosemide (LASIX) 20 MG tablet TAKE 1 TABLET BY MOUTH ONCE DAILY AS NEEDED FOR FLUID  . gabapentin (NEURONTIN) 600 MG tablet Take 1 tablet (600 mg total) by mouth 3 (three) times daily.  . Guaifenesin 1200 MG TB12 Take 1,200 mg by mouth daily.  Marland Kitchen ibuprofen (ADVIL) 200 MG tablet Take 3 tablets (600 mg total) by mouth every 8 (eight) hours as needed. For pain  . ipratropium (ATROVENT) 0.02 % nebulizer solution Take 2.5 mLs (0.5 mg total) by nebulization 4 (four) times daily as needed for wheezing or shortness of breath.  . leflunomide (ARAVA) 10 MG tablet Take 10 mg by mouth at  bedtime.   Marland Kitchen LINZESS 145 MCG CAPS capsule Take 145 mcg by mouth daily before breakfast.  . meloxicam (MOBIC) 15 MG tablet Take 1 tablet (15 mg total) by mouth at bedtime.  . Multiple Vitamin (MULTIVITAMIN WITH MINERALS) TABS tablet Take 1 tablet by mouth daily.  . Nutritional Supplements (ESTROVEN ENERGY PO) Take 1 tablet by mouth daily.   Marland Kitchen omeprazole (PRILOSEC) 40 MG capsule Take 1 capsule (40 mg total) by mouth 2 (two) times daily.  Marland Kitchen oxyCODONE (ROXICODONE) 5 MG immediate release tablet Take 1 tablet (5 mg total) by mouth every 4 (four) hours as needed for severe pain.  . potassium chloride SA (KLOR-CON) 20 MEQ tablet Take 1 tablet (20 mEq total) by mouth daily as needed (take with furosemide (fluid retention)).  Marland Kitchen sertraline (ZOLOFT) 100 MG tablet Take 1 tablet (100 mg total) by mouth daily.  Marland Kitchen topiramate (TOPAMAX) 25 MG tablet Take 1 tablet (25 mg total) by mouth 2 (two) times daily.  . valACYclovir (VALTREX) 1000 MG tablet TAKE 2 TABLETS BY MOUTH TWICE DAILY AS NEEDED FOR 5 DAYS  . venlafaxine XR (EFFEXOR-XR) 75 MG 24 hr capsule TAKE 3 CAPSULES BY MOUTH ONCE DAILY WITH BREAKFAST  . vitamin B-12 (CYANOCOBALAMIN) 1000 MCG tablet Take 6,000 mcg by mouth daily.  . vitamin E 400 UNIT capsule Take 1,600 Units by mouth daily.  Marland Kitchen XIIDRA 5 % SOLN Place 1 drop into both eyes daily.   No facility-administered encounter medications  on file as of 12/24/2020.   Star Rating Drugs: Atorvastatin 40 mg last filled on 11/29/2020 for 30 day supply at [facility].  Reviewed chart for medication changes ahead of medication coordination call.   BP Readings from Last 3 Encounters:  12/10/20 122/69  11/20/20 110/78  11/08/20 114/76    Lab Results  Component Value Date   HGBA1C 5.0 04/14/2018     Patient obtains medications through Adherence Packaging  30 Days   Last adherence delivery included:   Aripiprazole 10 mg tablet- Take one tablet by mouth daily (breakfast)  Atorvastatin 40 mg  tablet- Take one tablet by mouth daily (breakfast)  Buspirone 30 mg tablet- Take one tablet by mouth three times daily (breakfast, lunch, bedtime)  Carvedilol 12.5 mg tablet - Take on tablet by mouth twice daily (breakfast, bedtime)   Ezetimibe 10 mg tablet Take one tablet by mouth daily (breakfast)  Eszopiclone 3 mg tablet - Take one tablet by mouth nightly (bedtime)  Gabapentin 600 mg tablet- Take one tablet by mouth three times daily (breakfast, lunch bedtime)  Leflunomide 10 mg tablet- Take one tablet by mouth once daily (bedtime)  Linzess 145 mcg- Take one tablet by mouth daily (before breakfast)  Meloxicam 15 mg tablet- Take one tablet by mouth once daily (bedtime)   Omeprazole 40 my- Take one capsule by mouth two times a day (breakfast, bedtime)  Potassium 20 mEq - Take one capsule daily (breakfast)   Sertraline 100 mg tablet- Take one tablet by mouth daily (bedtime)  Topiramate 25 mg tablet-Take one tablet by mouth twice daily (breakfast, bedtime)   Venlafaxine ER 75 mg capsule- Take three capsules by mouth daily (breakfast)   Patient declined medication  last month   Ipratropium Bromide 0.02 solution- Inhale 2.16mls via nebulizer four times daily (PRN)  Furosemide 20 mg tablet-Take one tablet by mouth once daily (PRN)  Potassium Cl Er 20 Meq tablet- Take one tablet by mouth daily (take with furosemide) (prn)  Trelegy- Stopped taking daily for some time, so had adequate supply.    Patient is due for next adherence delivery on: 01/02/2021. Called patient and reviewed medications and coordinated delivery.  This delivery to include:  Aripiprazole 10 mg tablet- Take one tablet by mouth daily (breakfast)  Atorvastatin 40 mg tablet- Take one tablet by mouth daily (breakfast)  Buspirone 30 mg tablet- Take one tablet by mouth three times daily (breakfast, lunch, bedtime)  Carvedilol 12.5 mg tablet - Take on tablet by mouth twice daily (breakfast, bedtime)    Ezetimibe 10 mg tablet Take one tablet by mouth daily (breakfast)  Eszopiclone 3 mg tablet - Take one tablet by mouth nightly (bedtime)  Gabapentin 600 mg tablet- Take one tablet by mouth three times daily (breakfast, lunch bedtime)  Leflunomide 10 mg tablet- Take one tablet by mouth once daily (bedtime)  Linzess 145 mcg- Take one tablet by mouth daily (before breakfast)(Vials)   Meloxicam 15 mg tablet- Take one tablet by mouth once daily (bedtime)   Omeprazole 40 my- Take one capsule by mouth two times a day (breakfast, bedtime)  Potassium 20 mEq - Take one capsule daily (breakfast)   Sertraline 100 mg tablet- Take one tablet by mouth daily (bedtime)  Topiramate 25 mg tablet-Take one tablet by mouth twice daily (breakfast, bedtime)   Venlafaxine ER 75 mg capsule- Take three capsules by mouth daily (breakfast)  Ipratropium Bromide 0.02 solution- Inhale 2.37mls via nebulizer four times daily (PRN)  Trelegy 100-62.5-25 MCG- Inhale 1 puff daily  Potassium 20 mEq -Change from PRN to Take one capsule daily (breakfast) Per patient   Patient declined the following medications    Furosemide 20 mg tablet-Take one tablet by mouth once daily (PRN)  Patient needs refills for Eszopiclone,Potassium,Gaapentin,Leflunomide,Meloxicam,buspirone,  aripiprazole.  Confirmed delivery date of 01/02/2021, advised patient that pharmacy will contact them the morning of delivery.     [place] Pharmacist Assistant [phone]

## 2020-12-25 ENCOUNTER — Ambulatory Visit (HOSPITAL_BASED_OUTPATIENT_CLINIC_OR_DEPARTMENT_OTHER): Payer: PPO | Admitting: Student in an Organized Health Care Education/Training Program

## 2020-12-25 ENCOUNTER — Other Ambulatory Visit: Payer: Self-pay

## 2020-12-25 ENCOUNTER — Encounter: Payer: Self-pay | Admitting: Student in an Organized Health Care Education/Training Program

## 2020-12-25 ENCOUNTER — Ambulatory Visit
Admission: RE | Admit: 2020-12-25 | Discharge: 2020-12-25 | Disposition: A | Discharge status: Home / Self Care | Payer: PPO | Source: Ambulatory Visit | Attending: Student in an Organized Health Care Education/Training Program | Admitting: Student in an Organized Health Care Education/Training Program

## 2020-12-25 VITALS — BP 131/83 | HR 70 | Temp 97.0°F | Resp 18 | Ht 69.0 in | Wt 153.0 lb

## 2020-12-25 DIAGNOSIS — G894 Chronic pain syndrome: Secondary | ICD-10-CM | POA: Diagnosis not present

## 2020-12-25 DIAGNOSIS — M5416 Radiculopathy, lumbar region: Secondary | ICD-10-CM | POA: Insufficient documentation

## 2020-12-25 DIAGNOSIS — M961 Postlaminectomy syndrome, not elsewhere classified: Secondary | ICD-10-CM

## 2020-12-25 MED ORDER — CEPHALEXIN 500 MG PO CAPS: 500.0000 mg | ORAL_CAPSULE | Freq: Four times a day (QID) | ORAL | 0 refills | Status: AC

## 2020-12-25 MED ORDER — FENTANYL CITRATE (PF) 100 MCG/2ML IJ SOLN
INTRAMUSCULAR | Status: AC
  Filled 2020-12-25: qty 2

## 2020-12-25 MED ORDER — FENTANYL CITRATE (PF) 100 MCG/2ML IJ SOLN
25.0000 ug | INTRAMUSCULAR | Status: AC | PRN
  Administered 2020-12-25: 50 ug via INTRAVENOUS
  Administered 2020-12-25: 25 ug via INTRAVENOUS

## 2020-12-25 MED ORDER — LIDOCAINE HCL 2 % IJ SOLN
INTRAMUSCULAR | Status: AC
  Filled 2020-12-25: qty 20

## 2020-12-25 MED ORDER — LIDOCAINE HCL 2 % IJ SOLN
20.0000 mL | Freq: Once | INTRAMUSCULAR | Status: AC
  Administered 2020-12-25: 400 mg

## 2020-12-25 MED ORDER — CEFAZOLIN SODIUM 1 G IJ SOLR
INTRAMUSCULAR | Status: AC
  Filled 2020-12-25: qty 20

## 2020-12-25 MED ORDER — MIDAZOLAM HCL 5 MG/5ML IJ SOLN
INTRAMUSCULAR | Status: AC
  Filled 2020-12-25: qty 5

## 2020-12-25 MED ORDER — ROPIVACAINE HCL 2 MG/ML IJ SOLN
INTRAMUSCULAR | Status: AC
  Filled 2020-12-25: qty 10

## 2020-12-25 MED ORDER — ROPIVACAINE HCL 2 MG/ML IJ SOLN
9.0000 mL | Freq: Once | INTRAMUSCULAR | Status: AC
  Administered 2020-12-25: 9 mL via PERINEURAL

## 2020-12-25 MED ORDER — MIDAZOLAM HCL 5 MG/5ML IJ SOLN
1.0000 mg | INTRAMUSCULAR | Status: DC | PRN
  Administered 2020-12-25 (×2): 1 mg via INTRAVENOUS

## 2020-12-25 NOTE — Patient Instructions (Addendum)
Today we did the following -We have done a Spinal Cord Stimulator Trial with [employer]  -As long as the leads are in place, do not bathe or shower. You may sponge bathe.  -While the lead is in place, please limit the bending, lifting, or twisting because the lead can move.  -The things we want to see is if your pain improves (and by what percentage), if you can do more activity (don't overdo it), and if you can use less of your "as needed" medicine. Do not stop long acting medicines like methadone, oxycontin, MS Contin, etc without checking with [place].  -It is VERY important that you pick up the antibiotics we prescribed, Keflex, on your way home from the trial and take them as prescribed(4 times a day), starting today, for as long as the lead is in place.  -The Spina Cord Stimulator Representative will be in contact with you while the lead is in place to make sure the trial goes as well as possible.  -Please contact us with any questions or concerns at any time during the trial.   -If you start running a fever over 100 degrees, have severe back pain, or new pain running down the legs, or drainage coming from the lead site, contact us immediately and/or go to the emergency room.  -Please do not restart any sort of medication that can thin your blood such as Aspirin, ibuprofen, motrin, aleve, plavix, coumadin, etc. If you aren't sure, call and ask.  -We will have you return on 01/01/21 to have the lead removed. Pain Management Discharge Instructions  General Discharge Instructions :  If you need to reach your doctor call: Monday-Friday 8:00 am - 4:00 pm at [phone] or toll free [phone].  After clinic hours [phone] to have operator reach doctor.  Bring all of your medication bottles to all your appointments in the pain clinic.  To cancel or reschedule your appointment with Pain Management please remember to call 24 hours in advance to avoid a fee.  Refer to the  educational materials which you have been given on: General Risks, I had my Procedure. Discharge Instructions, Post Sedation.  Post Procedure Instructions:  The drugs you were given will stay in your system until tomorrow, so for the next 24 hours you should not drive, make any legal decisions or drink any alcoholic beverages.  You may eat anything you prefer, but it is better to start with liquids then soups and crackers, and gradually work up to solid foods.  Please notify your doctor immediately if you have any unusual bleeding, trouble breathing or pain that is not related to your normal pain.  Depending on the type of procedure that was done, some parts of your body may feel week and/or numb.  This usually clears up by tonight or the next day.  Walk with the use of an assistive device or accompanied by an adult for the 24 hours.  You may use ice on the affected area for the first 24 hours.  Put ice in a Ziploc bag and cover with a towel and place against area 15 minutes on 15 minutes off.  You may switch to heat after 24 hours.

## 2020-12-25 NOTE — Progress Notes (Signed)
Safety precautions to be maintained throughout the outpatient stay will include: orient to surroundings, keep bed in low position, maintain call bell within reach at all times, provide assistance with transfer out of bed and ambulation.  

## 2020-12-25 NOTE — Progress Notes (Signed)
PROVIDER NOTE: Information contained herein reflects review and annotations entered in association with encounter. Interpretation of such information and data should be left to medically-trained personnel. Information provided to patient can be located elsewhere in the medical record under "Patient Instructions". Document created using STT-dictation technology, any transcriptional errors that may result from process are unintentional.    Patient: Gabrielle Pineda  Service Category: Procedure  Provider: [name], MD  DOB: [birth year]  DOS: 12/25/2020  Location: [facility]  MRN: [id number]  Setting: Ambulatory - outpatient  Referring Provider: Florian Buff*  Type: Established Patient  Specialty: Interventional Pain Management  PCP: [name], PA-C   Primary Reason for Admission: Surgical management of chronic pain condition.  Procedure:  Anesthesia, Analgesia, Anxiolysis:  Type: Trial Spinal Cord Neurostimulator Implant (Percutaneous, interlaminar, posterior epidural placement) [employer] Purpose: To determine if a permanent implant may be effective in controlling some or all of Ms. Stiverson's chronic pain symptoms.  Region: Lumbar Level: T12-L1 Laterality: Bilateral   Type: Moderate (Conscious) Sedation combined with Local Anesthesia Indication(s): Analgesia and Anxiety Route: Intravenous (IV) IV Access: Secured Sedation: Meaningful verbal contact was maintained at all times during the procedure  Local Anesthetic: Lidocaine 1-2%   Indications: 1. Failed back surgical syndrome   2. Postlaminectomy syndrome, lumbar region   3. Lumbar radiculopathy (R>L)   4. Chronic pain syndrome    Pain Score: Pre-procedure: 7 /10 Post-procedure: 0-No pain/10   Pre-op H&P Assessment:  Gabrielle Pineda is a 65 y.o. (year old), female patient, seen today for interventional treatment. She  has a past surgical history that includes neck disc surgery; Breast  enhancement surgery (1981); Esophagogastroduodenoscopy; Tonsillectomy and adenoidectomy (1973 ); Carpal tunnel release (11/11/2011); Carpal tunnel release (2013); Rotator cuff repair (06/16/2016); Rotator cuff repair; Colonoscopy with propofol (N/A, 11/01/2017); Augmentation mammaplasty (Bilateral, 1982); Anterior cervical decomp/discectomy fusion (2012, 2015, 2018); and Lumbar laminectomy/decompression microdiscectomy (N/A, 08/15/2019).  Initial Vital Signs:  Pulse/EKG Rate: 80ECG Heart Rate: 78 Temp: (!) 97 F (36.1 C) Resp: 18 BP: 124/71 SpO2: 99 %  BMI: Estimated body mass index is 22.59 kg/m as calculated from the following:   Height as of this encounter: 5\' 9"  (1.753 m).   Weight as of this encounter: 153 lb (69.4 kg).  Risk Assessment: Allergies: Reviewed. She is allergic to plaquenil [hydroxychloroquine].  Allergy Precautions: None required Coagulopathies: Reviewed. None identified.  Blood-thinner therapy: None at this time Active Infection(s): Reviewed. None identified. Gabrielle Pineda is afebrile  Site Confirmation: Gabrielle Pineda was asked to confirm the procedure and laterality before marking the site, which she did. Procedure checklist: Completed Consent: Before the procedure and under the influence of no sedative(s), amnesic(s), or anxiolytics, the patient was informed of the treatment options, risks and possible complications. To fulfill our ethical and legal obligations, as recommended by the American Medical Association's Code of Ethics, I have informed the patient of my clinical impression; the nature and purpose of the treatment or procedure; the risks, benefits, and possible complications of the intervention; the alternatives, including doing nothing; the risk(s) and benefit(s) of the alternative treatment(s) or procedure(s); and the risk(s) and benefit(s) of doing nothing.  Gabrielle Pineda was provided with information about the general risks and possible complications  associated with most interventional procedures. These include, but are not limited to: failure to achieve desired goals, infection, bleeding, organ or nerve damage, allergic reactions, paralysis, and/or death.  In addition, she was informed of those risks and possible complications associated to this  particular procedure, which include, but are not limited to: damage to the implant; failure to decrease pain; local, systemic, or serious CNS infections, intraspinal abscess with possible cord compression and paralysis, or life-threatening such as meningitis; intrathecal and/or epidural bleeding with formation of hematoma with possible spinal cord compression and permanent paralysis; organ damage; nerve injury or damage with subsequent sensory, motor, and/or autonomic system dysfunction, resulting in transient or permanent pain, numbness, and/or weakness of one or several areas of the body; allergic reactions, either minor or major life-threatening, such as anaphylactic or anaphylactoid reactions.  Furthermore, Ms. Gurganus was informed of those risks and complications associated with the medications. These include, but are not limited to: allergic reactions (i.e.: anaphylactic or anaphylactoid reactions); arrhythmia;  Hypotension/hypertension; cardiovascular collapse; respiratory depression and/or shortness of breath; swelling or edema; medication-induced neural toxicity; particulate matter embolism and blood vessel occlusion with resultant organ, and/or nervous system infarction and permanent paralysis.  Finally, she was informed that Medicine is not an exact science; therefore, there is also the possibility of unforeseen or unpredictable risks and/or possible complications that may result in a catastrophic outcome. The patient indicated having understood very clearly. We have given the patient no guarantees and we have made no promises. Enough time was given to the patient to ask questions, all of which were  answered to the patient's satisfaction. Gabrielle Pineda has indicated that she wanted to continue with the procedure. Attestation: I, the ordering provider, attest that I have discussed with the patient the benefits, risks, side-effects, alternatives, likelihood of achieving goals, and potential problems during recovery for the procedure that I have provided informed consent. Date  Time: 12/25/2020  7:53 AM  Pre-Procedure Preparation:  Monitoring: As per clinic protocol. Respiration, ETCO2, SpO2, BP, heart rate and rhythm monitor placed and checked for adequate function Safety Precautions: Patient was assessed for positional comfort and pressure points before starting the procedure. Time-out: I initiated and conducted the "Time-out" before starting the procedure, as per protocol. The patient was asked to participate by confirming the accuracy of the "Time Out" information. Verification of the correct person, site, and procedure were performed and confirmed by me, the nursing staff, and the patient. "Time-out" conducted as per Joint Commission's Universal Protocol (UP.01.01.01). Time: 0846  Description of Procedure Process:   Position: Prone Target Area: Posterior epidural space Approach: Posterior percutaneous, paramedial, interlaminar approach Area Prepped: Bilateral thoraco-lumbar Region Prepping solution: ChloraPrep (2% chlorhexidine gluconate and 70% isopropyl alcohol) Safety Precautions: Safe injection practices and needle disposal techniques used. Medications properly checked for expiration dates. SDV (single dose vial) medications used. Aspiration looking for blood return and/or CSF was conducted prior to all injections. At no point did I inject any substances, as a needle was being advanced. No attempts were made at seeking any paresthesias.  Description of the Procedure: Availability of a responsible, adult driver, and NPO status confirmed. Informed consent was obtained after having  discussed risks and possible complications. An IV was started. The patient was then taken to the fluoroscopy suite, where the patient was placed in position for the procedure, over the fluoroscopy table. The patient was then monitored in the usual manner. Fluoroscopy was manipulated to obtain the best possible view of the target. Parallex error was corrected before commencing the procedure. Once a clear view of the target had been obtained, the skin and deeper tissues over the procedure site were infiltrated using lidocaine, loaded in a 10 cc luer-loc syringe with a 0.5 inch, 25-G needle. The introducer needle(s)  was/were then inserted through the skin and deeper tissues. A paramidline approach was used to enter the posterior epidural space at a 30 angle, using "Loss-of-resistance Technique" with 3 ml of PF-NaCl (0.9% NSS). Correct needle placement was confirmed in the antero-posterior and lateral fluoroscopic views. The lead was gently introduced and manipulated under real-time fluoroscopy, constantly assessing for pain, discomfort, or paresthesias, until the tip rested at the desired level. Both sides were done in identical fashion. Electrode placement was tested until appropriate coverage was attained. Once the patient confirmed that the stimulation was over the desired area, the lead(s) was/were secured in place and the introducer needles removed. This was done under real-time fluoroscopy while observing the electrode tip to avoid unintended migration. The area was covered with a non-occlusive dressing and the patient transported to recovery for further programming.  Vitals:   12/25/20 0925 12/25/20 0935 12/25/20 0945 12/25/20 0959  BP: (!) 150/79 (!) 141/93 137/84 131/83  Pulse: 70     Resp: 18 16 20 18   Temp:      SpO2: 99% 100% 100% 100%  Weight:      Height:       Start Time: 0846 hrs. End Time: 0918 hrs.  Neurostimulator Details:   Lead(s):  Brand: [employer]         Epidural  Access Level:  T12-L1 T12-L1  Lead implant:  Bilateral   No. of Electrodes/Lead:  16 16  Laterality:  Left Right  Top electrode location:  T7 T7  Model No.: [id number] E Same  Length: 50 cm Same  Lot No.: [id number]  MRI compatibility:  Yes Yes   Imaging Guidance (Spinal):          Type of Imaging Technique: Fluoroscopy Guidance (Spinal) Indication(s): Assistance in needle guidance and placement for procedures requiring needle placement in or near specific anatomical locations not easily accessible without such assistance. Exposure Time: Please see nurses notes. Contrast: None used. Fluoroscopic Guidance: I was personally present during the use of fluoroscopy. "Tunnel Vision Technique" used to obtain the best possible view of the target area. Parallax error corrected before commencing the procedure. "Direction-depth-direction" technique used to introduce the needle under continuous pulsed fluoroscopy. Once target was reached, antero-posterior, oblique, and lateral fluoroscopic projection used confirm needle placement in all planes. Images permanently stored in EMR. Interpretation: No contrast injected. I personally interpreted the imaging intraoperatively. Adequate needle placement confirmed in multiple planes. Permanent images saved into the patient's record.    Antibiotic Prophylaxis:   Anti-infectives (From admission, onward)   Start     Dose/Rate Route Frequency Ordered Stop   12/25/20 0000  cephALEXin (KEFLEX) 500 MG capsule        500 mg Oral 4 times daily 12/25/20 0819 01/01/21 2359     Indication(s): Procedural Prophylaxis.  Post-operative Assessment:  Post-procedure Vital Signs:  Pulse/HCG Rate: 70 (nsr)72 Temp: (!) 97 F (36.1 C) Resp: 18 BP: 131/83 SpO2: 270 %  Complications: No immediate post-treatment complications observed by team, or reported by patient.  Note: The patient tolerated the entire procedure well. A repeat set of vitals were taken after the  procedure and the patient was kept under observation following institutional policy, for this type of procedure. Post-procedural neurological assessment was performed, showing return to baseline, prior to discharge. The patient was provided with post-procedure discharge instructions, including a section on how to identify potential problems. Should any problems arise concerning this procedure, the patient was given instructions to immediately contact us, at any time,  without hesitation. In any case, we plan to contact the patient by telephone for a follow-up status report regarding this interventional procedure.  Comments:  No additional relevant information.  Plan of Care  Orders:  Orders Placed This Encounter  Procedures  . DG PAIN CLINIC C-ARM 1-60 MIN NO REPORT    Intraoperative interpretation by procedural physician at [place].    Standing Status:   Standing    Number of Occurrences:   1    Order Specific Question:   Reason for exam:    Answer:   Assistance in needle guidance and placement for procedures requiring needle placement in or near specific anatomical locations not easily accessible without such assistance.   Requested Prescriptions   Signed Prescriptions Disp Refills  . cephALEXin (KEFLEX) 500 MG capsule 28 capsule 0    Sig: Take 1 capsule (500 mg total) by mouth 4 (four) times daily for 7 days.    Medications administered: We administered lidocaine, fentaNYL, midazolam, and ropivacaine (PF) 2 mg/mL (0.2%).  See the medical record for exact dosing, route, and time of administration.  Follow-up plan:   Return in about 1 week (around 01/01/2021) for SCS lead pull (ok to double book).    Recent Visits Date Type Provider Dept  10/03/20 Telemedicine [name], MD [facility]  Showing recent visits within past 90 days and meeting all other requirements Today's Visits Date Type Provider Dept  12/25/20 Procedure visit [name], MD [facility]  Showing today's visits and meeting all other requirements Future Appointments Date Type Provider Dept  01/01/21 Appointment [name], MD [facility]  Showing future appointments within next 90 days and meeting all other requirements  Disposition: Discharge home  Discharge (Date  Time): 12/25/2020; 1010 hrs.   Primary Care Physician: Rubye Beach Location: [facility] Note by: [name], MD Date: 12/25/2020; Time: 10:58 AM

## 2020-12-26 ENCOUNTER — Other Ambulatory Visit: Payer: Self-pay | Admitting: Physician Assistant

## 2020-12-26 ENCOUNTER — Telehealth: Payer: Self-pay | Admitting: *Deleted

## 2020-12-26 DIAGNOSIS — F5101 Primary insomnia: Secondary | ICD-10-CM

## 2020-12-26 MED ORDER — POTASSIUM CHLORIDE CRYS ER 20 MEQ PO TBCR: 20.0000 meq | EXTENDED_RELEASE_TABLET | Freq: Every day | ORAL | 1 refills | Status: DC | PRN

## 2020-12-26 MED ORDER — BUSPIRONE HCL 30 MG PO TABS: 30.0000 mg | ORAL_TABLET | Freq: Three times a day (TID) | ORAL | 1 refills | Status: DC

## 2020-12-26 MED ORDER — ESZOPICLONE 3 MG PO TABS: 3.0000 mg | ORAL_TABLET | Freq: Every day | ORAL | 0 refills | Status: DC

## 2020-12-26 MED ORDER — ARIPIPRAZOLE 10 MG PO TABS: 10.0000 mg | ORAL_TABLET | Freq: Every day | ORAL | 1 refills | Status: DC

## 2020-12-26 NOTE — Telephone Encounter (Signed)
Saw Dennis for her insomnia a few weeks ago. Will forward Rx

## 2020-12-26 NOTE — Addendum Note (Signed)
Addended by: Daron Offer A on: 12/26/2020 08:35 AM   Modules accepted: Orders

## 2020-12-26 NOTE — Telephone Encounter (Signed)
No problems post procedure. 

## 2020-12-26 NOTE — Telephone Encounter (Signed)
Requested medication (s) are due for refill today: yes  Requested medication (s) are on the active medication list: yes  Last refill:  11/08/2020  Future visit scheduled: no  Notes to clinic:  this refill cannot be delegated    Requested Prescriptions  Pending Prescriptions Disp Refills   Eszopiclone 3 MG TABS 90 tablet 0    Sig: Take 1 tablet (3 mg total) by mouth at bedtime. Take immediately before bedtime      There is no refill protocol information for this order

## 2020-12-26 NOTE — Telephone Encounter (Signed)
Medication: Eszopiclone 3 MG TABS  Has the pt contacted their pharmacy? pharmacy is the one calling  Preferred pharmacy: [facility] - [place], [place] - [address]  Please be advised refills may take up to 3 business days.  We ask that you follow up with your pharmacy.

## 2020-12-26 NOTE — Progress Notes (Signed)
Reached out to PCP office to request refill for eszopiclone 3 mg to [facility] on 12/26/2020.  Cath[facility] Pharmacist Assistant [phone]

## 2020-12-27 ENCOUNTER — Telehealth: Payer: Self-pay

## 2020-12-27 ENCOUNTER — Other Ambulatory Visit: Payer: Self-pay | Admitting: Family Medicine

## 2020-12-27 DIAGNOSIS — F5101 Primary insomnia: Secondary | ICD-10-CM

## 2020-12-27 NOTE — Telephone Encounter (Signed)
Called patient per Dr Elwyn Lade request to check on patient Post SCS trial. Patient states everything is going well and that she likes it. Denies any needs at this time. Instructed to call with any problems or concerns.

## 2020-12-30 ENCOUNTER — Other Ambulatory Visit: Payer: Self-pay | Admitting: Family Medicine

## 2020-12-30 DIAGNOSIS — F5101 Primary insomnia: Secondary | ICD-10-CM

## 2020-12-31 ENCOUNTER — Other Ambulatory Visit: Payer: Self-pay | Admitting: Family Medicine

## 2020-12-31 ENCOUNTER — Other Ambulatory Visit: Payer: Self-pay

## 2020-12-31 DIAGNOSIS — F5101 Primary insomnia: Secondary | ICD-10-CM

## 2020-12-31 MED ORDER — ESZOPICLONE 3 MG PO TABS: 3.0000 mg | ORAL_TABLET | Freq: Every day | ORAL | 0 refills | Status: DC

## 2020-12-31 NOTE — Telephone Encounter (Signed)
Dennis-this is the Rx that would not go through electronically to her pharmacy.  Can you please send it to [place] Thanks

## 2021-01-01 ENCOUNTER — Encounter: Payer: Self-pay | Admitting: Student in an Organized Health Care Education/Training Program

## 2021-01-01 ENCOUNTER — Other Ambulatory Visit: Payer: Self-pay

## 2021-01-01 ENCOUNTER — Ambulatory Visit
Payer: PPO | Attending: Student in an Organized Health Care Education/Training Program | Admitting: Student in an Organized Health Care Education/Training Program

## 2021-01-01 VITALS — BP 154/91 | HR 70 | Temp 97.2°F | Resp 16 | Ht 69.0 in | Wt 153.0 lb

## 2021-01-01 DIAGNOSIS — G894 Chronic pain syndrome: Secondary | ICD-10-CM | POA: Diagnosis not present

## 2021-01-01 DIAGNOSIS — M5416 Radiculopathy, lumbar region: Secondary | ICD-10-CM | POA: Diagnosis not present

## 2021-01-01 DIAGNOSIS — M961 Postlaminectomy syndrome, not elsewhere classified: Secondary | ICD-10-CM | POA: Insufficient documentation

## 2021-01-01 NOTE — Progress Notes (Signed)
Safety precautions to be maintained throughout the outpatient stay will include: orient to surroundings, keep bed in low position, maintain call bell within reach at all times, provide assistance with transfer out of bed and ambulation.  

## 2021-01-01 NOTE — Progress Notes (Signed)
PROVIDER NOTE: Information contained herein reflects review and annotations entered in association with encounter. Interpretation of such information and data should be left to medically-trained personnel. Information provided to patient can be located elsewhere in the medical record under "Patient Instructions". Document created using STT-dictation technology, any transcriptional errors that may result from process are unintentional.    Patient: Gabrielle Pineda  Service Category: E/M  Provider: [name], MD  DOB: Feb 26, 1956  DOS: 01/01/2021  Specialty: Interventional Pain Management  MRN: [id number]  Setting: Ambulatory outpatient  PCP: Margo Common, PA-C  Type: Established Patient    Referring Provider: Florian Buff*  Location: Office  Delivery: Face-to-face     HPI  [name], a 65 y.o. 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 [name] primary complain today is Back Pain and Hip Pain (right) Last encounter: My last encounter with her was on 12/25/2020. Pain Assessment: Severity of Chronic pain is reported as a 0-No pain/10. Location: Back Lower/radiates into right hip. Onset: More than a month ago. Quality:  (not having pain at this time). Timing: Occasional. Modifying factor(s): scs. Vitals:  height is 5' 9"  (1.753 m) and weight is 153 lb (69.4 kg). Her temperature is 97.2 F (36.2 C) (abnormal). Her blood pressure is 154/91 (abnormal) and her pulse is 70. Her respiration is 16 and oxygen saturation is 97%.   Reason for encounter: post-procedure assessment.    Patient follows up today for [facility] scientific spinal cord stimulator trial lead removal.  She endorses a successful SCS trial and is looking forward to having this implanted.  She endorses approximately 75 to 80% pain relief during the duration of her trial and improvement in range of motion and functional status.  I will send her to Dr. Cari Caraway to  discuss permanent spinal cord stimulator implant.  ROS  Constitutional: Denies any fever or chills Gastrointestinal: No reported hemesis, hematochezia, vomiting, or acute GI distress Musculoskeletal: Low back pain, right hip, right leg pain, improved during duration of trial Neurological: No reported episodes of acute onset apraxia, aphasia, dysarthria, agnosia, amnesia, paralysis, loss of coordination, or loss of consciousness  Medication Review  ARIPiprazole, Eszopiclone, Fluticasone-Umeclidin-Vilant, Guaifenesin, Lifitegrast, Misc Natural Products, acyclovir ointment, atorvastatin, busPIRone, carvedilol, cephALEXin, ezetimibe, fluticasone, furosemide, gabapentin, ibuprofen, ipratropium, leflunomide, linaclotide, meloxicam, multivitamin with minerals, omeprazole, oxyCODONE, potassium chloride SA, sertraline, topiramate, valACYclovir, venlafaxine XR, vitamin B-12, and vitamin E  History Review  Allergy: [name] is allergic to plaquenil [hydroxychloroquine]. Drug: [name]  reports no history of drug use. Alcohol:  reports no history of alcohol use. Tobacco:  reports that she quit smoking about 2 months ago. She has a 40.00 pack-year smoking history. She has never used smokeless tobacco. Social: [name]  reports that she quit smoking about 2 months ago. She has a 40.00 pack-year smoking history. She has never used smokeless tobacco. She reports that she does not drink alcohol and does not use drugs. Medical:  has a past medical history of Alcohol abuse, Anemia, Anxiety, Arthritis, Cervicalgia, Cirrhosis ([place]) (1994), COPD (chronic obstructive pulmonary disease) ([place]), Depression, Dyspnea, GERD (gastroesophageal reflux disease), Headache, Heart murmur, Other and unspecified hyperlipidemia, and Tachycardia. Surgical: [name]  has a past surgical history that includes neck disc surgery; Breast enhancement surgery (1981); Esophagogastroduodenoscopy; Tonsillectomy and  adenoidectomy (1973 ); Carpal tunnel release (11/11/2011); Carpal tunnel release (2013); Rotator cuff repair (06/16/2016); Rotator cuff repair; Colonoscopy with propofol (N/A, 11/01/2017); Augmentation mammaplasty (Bilateral, 1982);  Anterior cervical decomp/discectomy fusion (2012, 2015, 2018); and Lumbar laminectomy/decompression microdiscectomy (N/A, 08/15/2019). Family: family history includes Alcohol abuse in her father and mother; Bipolar disorder in her mother; Pancreatic cancer in her father; Suicidality in her mother.  Laboratory Chemistry Profile   Renal Lab Results  Component Value Date   BUN 7 (L) 11/08/2020   CREATININE 0.96 11/08/2020   BCR 7 (L) 11/08/2020   GFRAA 72 05/06/2020   GFRNONAA 62 05/06/2020     Hepatic Lab Results  Component Value Date   AST 28 04/19/2020   ALT 25 04/19/2020   ALBUMIN 4.2 05/06/2020   ALKPHOS 115 04/19/2020   LIPASE 63 03/23/2018     Electrolytes Lab Results  Component Value Date   NA 140 11/08/2020   K 4.3 11/08/2020   CL 104 11/08/2020   CALCIUM 9.5 11/08/2020   PHOS 3.2 05/06/2020     Bone No results found for: VD25OH, VD125OH2TOT, HK7425ZD6, LO7564PP2, 25OHVITD1, 25OHVITD2, 25OHVITD3, TESTOFREE, TESTOSTERONE   Inflammation (CRP: Acute Phase) (ESR: Chronic Phase) No results found for: CRP, ESRSEDRATE, LATICACIDVEN     Note: Above Lab results reviewed.   Physical Exam  General appearance: Well nourished, well developed, and well hydrated. In no apparent acute distress Mental status: Alert, oriented x 3 (person, place, & time)       Respiratory: No evidence of acute respiratory distress Eyes: PERLA Vitals: BP (!) 154/91   Pulse 70   Temp (!) 97.2 F (36.2 C)   Resp 16   Ht 5' 9"  (1.753 m)   Wt 153 lb (69.4 kg)   SpO2 97%   BMI 22.59 kg/m  BMI: Estimated body mass index is 22.59 kg/m as calculated from the following:   Height as of this encounter: 5' 9"  (1.753 m).   Weight as of this encounter: 153 lb (69.4  kg). Ideal: Ideal body weight: 66.2 kg (145 lb 15.1 oz) Adjusted ideal body weight: 67.5 kg (148 lb 12.3 oz)  [employer] spinal cord stimulator trial leads removed.  Tips intact. Insertion site appears nonerythematous, no drainage no induration noted Improvement in range of motion and pain during duration of trial 5 out of 5 strength bilateral lower extremity: Plantar flexion, dorsiflexion, knee flexion, knee extension.  Assessment   Status Diagnosis  Responding Responding Responding 1. Failed back surgical syndrome   2. Postlaminectomy syndrome, lumbar region   3. Lumbar radiculopathy (R>L)   4. Chronic pain syndrome       Plan of Care  Ms. Oceana has a current medication list which includes the following long-term medication(s): aripiprazole, atorvastatin, carvedilol, eszopiclone, ezetimibe, fluticasone, furosemide, gabapentin, ipratropium, linzess, omeprazole, potassium chloride sa, sertraline, topiramate, and venlafaxine xr.   Referral to Dr. Cari Caraway for permanent implant of [facility] Scientific spinal cord stimulator.  Follow-up as needed.  Orders:  Orders Placed This Encounter  Procedures  . Ambulatory referral to Neurosurgery    Referral Priority:   Routine    Referral Type:   Surgical    Referral Reason:   Specialty Services Required    Referred to Provider:   Meade Maw, MD    Requested Specialty:   Neurosurgery    Number of Visits Requested:   1   Follow-up plan:   Return if symptoms worsen or fail to improve.   Recent Visits Date Type Provider Dept  12/25/20 Procedure visit [name], MD [facility]  10/03/20 Telemedicine [name], MD [facility]  Showing recent visits within past 90 days  and meeting all other requirements Today's Visits Date Type Provider Dept  01/01/21 Procedure visit [name], MD [facility]  Showing today's visits and meeting all other requirements Future  Appointments No visits were found meeting these conditions. Showing future appointments within next 90 days and meeting all other requirements  I discussed the assessment and treatment plan with the patient. The patient was provided an opportunity to ask questions and all were answered. The patient agreed with the plan and demonstrated an understanding of the instructions.  Patient advised to call back or seek an in-person evaluation if the symptoms or condition worsens.  Duration of encounter: 30 minutes.  Note by: [name], MD Date: 01/01/2021; Time: 1:16 PM

## 2021-01-08 DIAGNOSIS — M25551 Pain in right hip: Secondary | ICD-10-CM | POA: Diagnosis not present

## 2021-01-08 DIAGNOSIS — M1611 Unilateral primary osteoarthritis, right hip: Secondary | ICD-10-CM | POA: Diagnosis not present

## 2021-01-13 ENCOUNTER — Encounter: Payer: Self-pay | Admitting: Family Medicine

## 2021-01-14 ENCOUNTER — Other Ambulatory Visit: Payer: Self-pay | Admitting: Family Medicine

## 2021-01-14 DIAGNOSIS — L237 Allergic contact dermatitis due to plants, except food: Secondary | ICD-10-CM

## 2021-01-14 MED ORDER — PREDNISONE 5 MG PO TABS: ORAL_TABLET | ORAL | 0 refills | Status: DC

## 2021-01-14 NOTE — Progress Notes (Signed)
Heavy contact dermatitis from poison ivy. Little relief from Benadryl and Hydrocortisone cream. Requests prednisone taper. Sent to [facility] in [place].

## 2021-01-17 ENCOUNTER — Telehealth: Payer: Self-pay | Admitting: Family Medicine

## 2021-01-17 ENCOUNTER — Other Ambulatory Visit: Payer: Self-pay | Admitting: Family Medicine

## 2021-01-17 DIAGNOSIS — F5101 Primary insomnia: Secondary | ICD-10-CM

## 2021-01-17 NOTE — Telephone Encounter (Signed)
Please check with [place] to see if this prescription was filled and picked up on 12-31-20. If it was, she cannot get another prescription for 2 weeks.

## 2021-01-17 NOTE — Telephone Encounter (Signed)
Spoke with patient on the phone and she states that prescription for Eszopiclone 3 mg [facility],. She states that she contacted our office back because it could not be filled at [facility] and she was to pick up from [facility] where it was sent. Patient reports that [facility] would not let her get prescription because it showed in there system that it had already been filled by [facility] according to patient. Patient states that she would like to have prescription printed out since she never had it filled and to take to pharmacy, when I offered to contact [place] to clarify that prescription was received electronically on 12/31/20 patient stated that it was not necessary and ask that we just print prescription for her to pick up Monday? Please review request. Amparo [place]

## 2021-01-17 NOTE — Telephone Encounter (Signed)
Patient would like the nurse to call regarding a new medication that the doctor prescribed.  She does not know the name of the medication, but would like the pharmacy switched or would like to come to the office to pick up.  Please advise and call patient to discuss further at [phone]

## 2021-01-21 NOTE — Telephone Encounter (Signed)
Thanks

## 2021-01-21 NOTE — Telephone Encounter (Signed)
[facility] confirmed that they filled this prescription.  She gets pre-packed pill packs and they did put it in her evening med pack.  It was filled on 01/05/21 and she will not be out until June 21.

## 2021-01-23 ENCOUNTER — Telehealth: Payer: Self-pay

## 2021-01-23 ENCOUNTER — Other Ambulatory Visit: Payer: Self-pay | Admitting: Family Medicine

## 2021-01-23 DIAGNOSIS — M0579 Rheumatoid arthritis with rheumatoid factor of multiple sites without organ or systems involvement: Secondary | ICD-10-CM | POA: Diagnosis not present

## 2021-01-23 DIAGNOSIS — F5101 Primary insomnia: Secondary | ICD-10-CM

## 2021-01-23 NOTE — Telephone Encounter (Signed)
Medication Refill - Medication: Eszopiclone 3 MG TABS   Has the patient contacted their pharmacy? Yes.   (Agent: If no, request that the patient contact the pharmacy for the refill.) (Agent: If yes, when and what did the pharmacy advise?)  Preferred Pharmacy (with phone number or street name):  [facility] - [place], [place] - [address] Phone:  [phone]  Fax:  [phone]      Agent: Please be advised that RX refills may take up to 3 business days. We ask that you follow-up with your pharmacy.

## 2021-01-23 NOTE — Telephone Encounter (Signed)
Requested medication (s) are due for refill today:   Provider to review  Requested medication (s) are on the active medication list:   Yes  Future visit scheduled:   No   Seen a month ago   Last ordered: 12/31/2020 #30, 0 refills  Non delegated refill   Requested Prescriptions  Pending Prescriptions Disp Refills   Eszopiclone 3 MG TABS 30 tablet 0    Sig: Take 1 tablet (3 mg total) by mouth at bedtime. Take immediately before bedtime      Not Delegated - Psychiatry:  Anxiolytics/Hypnotics Failed - 01/23/2021 11:13 AM      Failed - This refill cannot be delegated      Failed - Urine Drug Screen completed in last 360 days      Passed - Valid encounter within last 6 months    Recent Outpatient Visits           1 month ago Primary insomnia   [place], PA-C   2 months ago Hypokalemia   [place], [place], [place]   10 months ago Acute non-recurrent pansinusitis   [place], [place]   10 months ago Acute non-recurrent pansinusitis   [place], [place]   1 year ago GAD (generalized anxiety disorder)   [facility], [place], [place]

## 2021-01-23 NOTE — Progress Notes (Signed)
Chronic Care Management Pharmacy Assistant   Name: Gabrielle Pineda  MRN: [id number] DOB: Dec 22, 1955  Reason for Encounter: Medication Review/Medication Coordination Call  Recent office visits:  01/14/2021 Vernie Murders, PA-C (PCP) Started Prednisone Taper 5mg   Recent consult visits:  01/08/2021 Lauris Poag (Orthopedic Surgery) 01/01/2021 [name], MD (Pain Management) -  Referral to Dr. Cari Caraway for permanent implant of [employer] spinal cord stimulator 12/25/2020 [name], MD (Pain Management) - Patient had a Spinal Cord Stimulator Procedure and was Started on Keflex 500 mg X 7 days.   Hospital visits:  None in previous 6 months  Medications: Outpatient Encounter Medications as of 01/23/2021  Medication Sig   acyclovir ointment (ZOVIRAX) 5 % Apply 1 application topically every 3 (three) hours.   ARIPiprazole (ABILIFY) 10 MG tablet Take 1 tablet (10 mg total) by mouth daily.   atorvastatin (LIPITOR) 40 MG tablet Take 1 tablet (40 mg total) by mouth daily.   busPIRone (BUSPAR) 30 MG tablet Take 1 tablet (30 mg total) by mouth 3 (three) times daily.   carvedilol (COREG) 12.5 MG tablet Take 1 tablet (12.5 mg total) by mouth 2 (two) times daily with a meal.   Eszopiclone 3 MG TABS Take 1 tablet (3 mg total) by mouth at bedtime. Take immediately before bedtime   ezetimibe (ZETIA) 10 MG tablet Take 1 tablet (10 mg total) by mouth daily.   fluticasone (FLONASE) 50 MCG/ACT nasal spray Place 2 sprays into both nostrils daily.   Fluticasone-Umeclidin-Vilant (TRELEGY ELLIPTA) 100-62.5-25 MCG/INH AEPB Inhale 1 puff into the lungs daily.   furosemide (LASIX) 20 MG tablet TAKE 1 TABLET BY MOUTH ONCE DAILY AS NEEDED FOR FLUID   gabapentin (NEURONTIN) 600 MG tablet Take 1 tablet (600 mg total) by mouth 3 (three) times daily.   Guaifenesin 1200 MG TB12 Take 1,200 mg by mouth daily.   ibuprofen (ADVIL) 200 MG tablet Take 3 tablets (600 mg total) by mouth every 8  (eight) hours as needed. For pain   ipratropium (ATROVENT) 0.02 % nebulizer solution Take 2.5 mLs (0.5 mg total) by nebulization 4 (four) times daily as needed for wheezing or shortness of breath.   leflunomide (ARAVA) 10 MG tablet Take 10 mg by mouth at bedtime.    LINZESS 145 MCG CAPS capsule Take 145 mcg by mouth daily before breakfast.   meloxicam (MOBIC) 15 MG tablet Take 1 tablet (15 mg total) by mouth at bedtime.   Multiple Vitamin (MULTIVITAMIN WITH MINERALS) TABS tablet Take 1 tablet by mouth daily.   Nutritional Supplements (ESTROVEN ENERGY PO) Take 1 tablet by mouth daily.    omeprazole (PRILOSEC) 40 MG capsule Take 1 capsule (40 mg total) by mouth 2 (two) times daily.   oxyCODONE (ROXICODONE) 5 MG immediate release tablet Take 1 tablet (5 mg total) by mouth every 4 (four) hours as needed for severe pain.   potassium chloride SA (KLOR-CON) 20 MEQ tablet Take 1 tablet (20 mEq total) by mouth daily as needed (take with furosemide (fluid retention)).   predniSONE (DELTASONE) 5 MG tablet Taper down by 1 tablet by mouth daily starting at 6 day 1, 5 day 2, 4 day 3, 3 day 4, 2 day 5 and 1 day 6. Divide among meals and bedtime.   sertraline (ZOLOFT) 100 MG tablet Take 1 tablet (100 mg total) by mouth daily.   topiramate (TOPAMAX) 25 MG tablet Take 1 tablet (25 mg total) by mouth 2 (two) times daily.   valACYclovir (VALTREX) 1000 MG  tablet TAKE 2 TABLETS BY MOUTH TWICE DAILY AS NEEDED FOR 5 DAYS   venlafaxine XR (EFFEXOR-XR) 75 MG 24 hr capsule TAKE 3 CAPSULES BY MOUTH ONCE DAILY WITH BREAKFAST   vitamin B-12 (CYANOCOBALAMIN) 1000 MCG tablet Take 6,000 mcg by mouth daily.   vitamin E 400 UNIT capsule Take 1,600 Units by mouth daily.   XIIDRA 5 % SOLN Place 1 drop into both eyes daily.   No facility-administered encounter medications on file as of 01/23/2021.   Star Rating Drugs: Atorvastatin 40 mg last filled on 12/31/2020 for 30 day supply at [facility].  Reviewed chart for medication  changes ahead of medication coordination call.   BP Readings from Last 3 Encounters:  01/01/21 (!) 154/91  12/25/20 131/83  12/10/20 122/69    Lab Results  Component Value Date   HGBA1C 5.0 04/14/2018     Patient obtains medications through Adherence Packaging  30 Days   Last adherence delivery included:  Aripiprazole 10 mg tablet- Take one tablet by mouth daily (breakfast) Atorvastatin 40 mg tablet- Take one tablet by mouth daily (breakfast) Buspirone 30 mg tablet- Take one tablet by mouth three times daily (breakfast, lunch, bedtime) Carvedilol 12.5 mg tablet - Take on tablet by mouth twice daily (breakfast, bedtime) Ezetimibe 10 mg tablet Take one tablet by mouth daily (breakfast) Eszopiclone 3 mg tablet - Take one tablet by mouth nightly (bedtime) Gabapentin 600 mg tablet- Take one tablet by mouth three times daily (breakfast, lunch bedtime) Leflunomide 10 mg tablet- Take one tablet by mouth once daily (bedtime) Linzess 145 mcg- Take one tablet by mouth daily (before breakfast)(Vials)  Meloxicam 15 mg tablet- Take one tablet by mouth once daily (bedtime) Omeprazole 40 mg- Take one capsule by mouth two times a day (breakfast, bedtime) Potassium 20 mEq - Take one capsule daily (breakfast) Sertraline 100 mg tablet- Take one tablet by mouth daily (bedtime) Topiramate 25 mg tablet-Take one tablet by mouth twice daily (breakfast, bedtime) Venlafaxine ER 75 mg capsule- Take three capsules by mouth daily (breakfast) Ipratropium Bromide 0.02 solution- Inhale 2.52mls via nebulizer four times daily (PRN) Trelegy 100-62.5-25 MCG- Inhale 1 puff daily  Patient declined medication last month:  Furosemide 20 mg tablet-Take one tablet by mouth once daily (PRN) Patient is due for next adherence delivery on: 01/31/2021. Called patient and reviewed medications and coordinated delivery.  This delivery to include: Aripiprazole 10 mg tablet- Take one tablet by mouth daily  (breakfast) Atorvastatin 40 mg tablet- Take one tablet by mouth daily (breakfast) Buspirone 30 mg tablet- Take one tablet by mouth three times daily (breakfast, lunch, bedtime) Carvedilol 12.5 mg tablet - Take on tablet by mouth twice daily (breakfast, bedtime) Ezetimibe 10 mg tablet Take one tablet by mouth daily (breakfast) Eszopiclone 3 mg tablet - Take one tablet by mouth nightly (bedtime) Gabapentin 600 mg tablet- Take one tablet by mouth three times daily (breakfast, lunch bedtime) Leflunomide 10 mg tablet- Take one tablet by mouth once daily (bedtime) Linzess 145 mcg- Take one tablet by mouth daily (before breakfast)(Vials)  Meloxicam 15 mg tablet- Take one tablet by mouth once daily (bedtime) Omeprazole 40 mg- Take one capsule by mouth two times a day (breakfast, bedtime) Potassium 20 mEq - Take one capsule daily (breakfast) Sertraline 100 mg tablet- Take one tablet by mouth daily (bedtime) Topiramate 25 mg tablet-Take one tablet by mouth twice daily (breakfast, bedtime) Venlafaxine ER 75 mg capsule- Take three capsules by mouth daily (breakfast) Trelegy 100-62.5-25 MCG- Inhale 1 puff daily  Coordinated acute fill for XIDRA 5%  to be delivered 01/24/2021.Completed acute form and sent to clinical pharmacist for review.  Patient declined the following medications: Ipratropium Bromide 0.02 solution- Inhale 2.67mls via nebulizer four times daily (PRN)  Furosemide 20 mg tablet-Take one tablet by mouth once daily (PRN)  Patient needs refills for XIDRA 5% informed patient to call her provider that prescribe XIDRA to request a refill [facility] per clinical pharmacist.Patient verbalized understanding stating her eye doctor prescribe it and she will call and request the refill..Eszopiclone 3 mg , reach out to PCP office to request a refill on 01/23/2021 to be sent to [facility].   Confirmed delivery date of 02/03/2021, advised patient that pharmacy will contact them  the morning of delivery.  Patient is schedule for a telephone follow with clinical pharmacist on 02/25/2021 at 2:00 pm.  [place] Pharmacist Assistant [phone]

## 2021-01-28 ENCOUNTER — Other Ambulatory Visit: Payer: Self-pay | Admitting: Physician Assistant

## 2021-01-28 DIAGNOSIS — F5101 Primary insomnia: Secondary | ICD-10-CM

## 2021-01-31 ENCOUNTER — Other Ambulatory Visit: Payer: Self-pay

## 2021-01-31 DIAGNOSIS — F5101 Primary insomnia: Secondary | ICD-10-CM

## 2021-01-31 MED ORDER — ESZOPICLONE 3 MG PO TABS: 3.0000 mg | ORAL_TABLET | Freq: Every day | ORAL | 5 refills | Status: DC

## 2021-01-31 NOTE — Telephone Encounter (Signed)
Please review for [facility] says they need to have this refilled today so they can get her prescriptions sent out.    Last refill was 01/05/2021.   Thanks,   -Mickel Baas

## 2021-02-06 DIAGNOSIS — M545 Low back pain, unspecified: Secondary | ICD-10-CM | POA: Diagnosis not present

## 2021-02-06 DIAGNOSIS — M0579 Rheumatoid arthritis with rheumatoid factor of multiple sites without organ or systems involvement: Secondary | ICD-10-CM | POA: Diagnosis not present

## 2021-02-06 DIAGNOSIS — M159 Polyosteoarthritis, unspecified: Secondary | ICD-10-CM | POA: Diagnosis not present

## 2021-02-06 DIAGNOSIS — Z79899 Other long term (current) drug therapy: Secondary | ICD-10-CM | POA: Diagnosis not present

## 2021-02-06 DIAGNOSIS — G8929 Other chronic pain: Secondary | ICD-10-CM | POA: Diagnosis not present

## 2021-02-07 ENCOUNTER — Telehealth: Payer: Self-pay

## 2021-02-07 NOTE — Progress Notes (Signed)
Patient states she did not receive her Potassium in her delivery packs.Patient states she takes Potassium 20 Meq-  Take one capsule daily (breakfast). Patient is requesting a acute fill to be deliver on 02/07/2021.Sent acute form to clinical pharmacist for review.  [place] Pharmacist Assistant [phone]

## 2021-02-10 DIAGNOSIS — S92322D Displaced fracture of second metatarsal bone, left foot, subsequent encounter for fracture with routine healing: Secondary | ICD-10-CM | POA: Diagnosis not present

## 2021-02-10 DIAGNOSIS — S92315A Nondisplaced fracture of first metatarsal bone, left foot, initial encounter for closed fracture: Secondary | ICD-10-CM | POA: Diagnosis not present

## 2021-02-13 ENCOUNTER — Telehealth: Payer: Self-pay

## 2021-02-13 NOTE — Progress Notes (Signed)
Chronic Care Management Pharmacy Assistant   Name: Gabrielle Pineda  MRN: [id number] DOB: 24-Jan-1956  Reason for Encounter: Medication Review/ Adherence Review   Recent office visits:  None  Recent consult visits:  None  Hospital visits:  None in previous 6 months  Medications: Outpatient Encounter Medications as of 02/13/2021  Medication Sig   acyclovir ointment (ZOVIRAX) 5 % Apply 1 application topically every 3 (three) hours.   ARIPiprazole (ABILIFY) 10 MG tablet Take 1 tablet (10 mg total) by mouth daily.   atorvastatin (LIPITOR) 40 MG tablet Take 1 tablet (40 mg total) by mouth daily.   busPIRone (BUSPAR) 30 MG tablet Take 1 tablet (30 mg total) by mouth 3 (three) times daily.   carvedilol (COREG) 12.5 MG tablet Take 1 tablet (12.5 mg total) by mouth 2 (two) times daily with a meal.   Eszopiclone 3 MG TABS Take 1 tablet (3 mg total) by mouth at bedtime. Take immediately before bedtime   ezetimibe (ZETIA) 10 MG tablet Take 1 tablet (10 mg total) by mouth daily.   fluticasone (FLONASE) 50 MCG/ACT nasal spray Place 2 sprays into both nostrils daily.   Fluticasone-Umeclidin-Vilant (TRELEGY ELLIPTA) 100-62.5-25 MCG/INH AEPB Inhale 1 puff into the lungs daily.   furosemide (LASIX) 20 MG tablet TAKE 1 TABLET BY MOUTH ONCE DAILY AS NEEDED FOR FLUID   gabapentin (NEURONTIN) 600 MG tablet Take 1 tablet (600 mg total) by mouth 3 (three) times daily.   Guaifenesin 1200 MG TB12 Take 1,200 mg by mouth daily.   ibuprofen (ADVIL) 200 MG tablet Take 3 tablets (600 mg total) by mouth every 8 (eight) hours as needed. For pain   ipratropium (ATROVENT) 0.02 % nebulizer solution Take 2.5 mLs (0.5 mg total) by nebulization 4 (four) times daily as needed for wheezing or shortness of breath.   leflunomide (ARAVA) 10 MG tablet Take 10 mg by mouth at bedtime.    LINZESS 145 MCG CAPS capsule Take 145 mcg by mouth daily before breakfast.   meloxicam (MOBIC) 15 MG tablet Take 1 tablet (15 mg total) by  mouth at bedtime.   Multiple Vitamin (MULTIVITAMIN WITH MINERALS) TABS tablet Take 1 tablet by mouth daily.   Nutritional Supplements (ESTROVEN ENERGY PO) Take 1 tablet by mouth daily.    omeprazole (PRILOSEC) 40 MG capsule Take 1 capsule (40 mg total) by mouth 2 (two) times daily.   oxyCODONE (ROXICODONE) 5 MG immediate release tablet Take 1 tablet (5 mg total) by mouth every 4 (four) hours as needed for severe pain.   potassium chloride SA (KLOR-CON) 20 MEQ tablet Take 1 tablet (20 mEq total) by mouth daily as needed (take with furosemide (fluid retention)).   predniSONE (DELTASONE) 5 MG tablet Taper down by 1 tablet by mouth daily starting at 6 day 1, 5 day 2, 4 day 3, 3 day 4, 2 day 5 and 1 day 6. Divide among meals and bedtime.   sertraline (ZOLOFT) 100 MG tablet Take 1 tablet (100 mg total) by mouth daily.   topiramate (TOPAMAX) 25 MG tablet Take 1 tablet (25 mg total) by mouth 2 (two) times daily.   valACYclovir (VALTREX) 1000 MG tablet TAKE 2 TABLETS BY MOUTH TWICE DAILY AS NEEDED FOR 5 DAYS   venlafaxine XR (EFFEXOR-XR) 75 MG 24 hr capsule TAKE 3 CAPSULES BY MOUTH ONCE DAILY WITH BREAKFAST   vitamin B-12 (CYANOCOBALAMIN) 1000 MCG tablet Take 6,000 mcg by mouth daily.   vitamin E 400 UNIT capsule Take 1,600 Units by mouth  daily.   XIIDRA 5 % SOLN Place 1 drop into both eyes daily.   No facility-administered encounter medications on file as of 02/13/2021.    Star Rating Drugs: Atorvastatin 40 mg last filled on 01/28/2021 for 30 day supply at [facility].   Care Gaps: Per chart, patient is due for the following Care Gaps: Pneumococcal Vaccine, Shingrix and COVID- 19 vaccine.  [place] Pharmacist Assistant [phone]

## 2021-02-17 ENCOUNTER — Other Ambulatory Visit: Payer: Self-pay | Admitting: Student in an Organized Health Care Education/Training Program

## 2021-02-17 DIAGNOSIS — G629 Polyneuropathy, unspecified: Secondary | ICD-10-CM

## 2021-02-19 ENCOUNTER — Other Ambulatory Visit: Payer: Self-pay | Admitting: Neurosurgery

## 2021-02-24 ENCOUNTER — Telehealth: Payer: Self-pay

## 2021-02-24 NOTE — Progress Notes (Signed)
Chronic Care Management Pharmacy Assistant   Name: Gabrielle Pineda  MRN: [id number] DOB: 1956/08/17  Patient called to remind of appointment with Junius Argyle, CPP on 02/25/2021 @ 1400   No answer, left message of appointment date, time and type of appointment (telephone). Left message to have all medications, supplements, blood pressure and/or blood sugar logs available during appointment and to return call if need to reschedule.   Star Rating Drug: Atorvastatin 40 mg last filled on 01/28/2021 for a 30-Day supply with [facility]  Any gaps in medications fill history? Furosemide according to her chart last fill on 02/13/2020 for a 90-Day supply at [facility] (system is down unable to look at [facility] at the moment)    Medications: Outpatient Encounter Medications as of 02/24/2021  Medication Sig   acyclovir ointment (ZOVIRAX) 5 % Apply 1 application topically every 3 (three) hours.   ARIPiprazole (ABILIFY) 10 MG tablet Take 1 tablet (10 mg total) by mouth daily.   atorvastatin (LIPITOR) 40 MG tablet Take 1 tablet (40 mg total) by mouth daily.   busPIRone (BUSPAR) 30 MG tablet Take 1 tablet (30 mg total) by mouth 3 (three) times daily.   carvedilol (COREG) 12.5 MG tablet Take 1 tablet (12.5 mg total) by mouth 2 (two) times daily with a meal.   Eszopiclone 3 MG TABS Take 1 tablet (3 mg total) by mouth at bedtime. Take immediately before bedtime   ezetimibe (ZETIA) 10 MG tablet Take 1 tablet (10 mg total) by mouth daily.   fluticasone (FLONASE) 50 MCG/ACT nasal spray Place 2 sprays into both nostrils daily.   Fluticasone-Umeclidin-Vilant (TRELEGY ELLIPTA) 100-62.5-25 MCG/INH AEPB Inhale 1 puff into the lungs daily.   furosemide (LASIX) 20 MG tablet TAKE 1 TABLET BY MOUTH ONCE DAILY AS NEEDED FOR FLUID   gabapentin (NEURONTIN) 600 MG tablet Take 1 tablet (600 mg total) by mouth 3 (three) times daily.   Guaifenesin 1200 MG TB12 Take 1,200 mg by mouth daily.   ibuprofen  (ADVIL) 200 MG tablet Take 3 tablets (600 mg total) by mouth every 8 (eight) hours as needed. For pain   ipratropium (ATROVENT) 0.02 % nebulizer solution Take 2.5 mLs (0.5 mg total) by nebulization 4 (four) times daily as needed for wheezing or shortness of breath.   leflunomide (ARAVA) 10 MG tablet Take 10 mg by mouth at bedtime.    LINZESS 145 MCG CAPS capsule Take 145 mcg by mouth daily before breakfast.   meloxicam (MOBIC) 15 MG tablet Take 1 tablet (15 mg total) by mouth at bedtime.   Multiple Vitamin (MULTIVITAMIN WITH MINERALS) TABS tablet Take 1 tablet by mouth daily.   Nutritional Supplements (ESTROVEN ENERGY PO) Take 1 tablet by mouth daily.    omeprazole (PRILOSEC) 40 MG capsule Take 1 capsule (40 mg total) by mouth 2 (two) times daily.   oxyCODONE (ROXICODONE) 5 MG immediate release tablet Take 1 tablet (5 mg total) by mouth every 4 (four) hours as needed for severe pain.   potassium chloride SA (KLOR-CON) 20 MEQ tablet Take 1 tablet (20 mEq total) by mouth daily as needed (take with furosemide (fluid retention)).   predniSONE (DELTASONE) 5 MG tablet Taper down by 1 tablet by mouth daily starting at 6 day 1, 5 day 2, 4 day 3, 3 day 4, 2 day 5 and 1 day 6. Divide among meals and bedtime.   sertraline (ZOLOFT) 100 MG tablet Take 1 tablet (100 mg total) by mouth daily.   topiramate (TOPAMAX)  25 MG tablet Take 1 tablet (25 mg total) by mouth 2 (two) times daily.   valACYclovir (VALTREX) 1000 MG tablet TAKE 2 TABLETS BY MOUTH TWICE DAILY AS NEEDED FOR 5 DAYS   venlafaxine XR (EFFEXOR-XR) 75 MG 24 hr capsule TAKE 3 CAPSULES BY MOUTH ONCE DAILY WITH BREAKFAST   vitamin B-12 (CYANOCOBALAMIN) 1000 MCG tablet Take 6,000 mcg by mouth daily.   vitamin E 400 UNIT capsule Take 1,600 Units by mouth daily.   XIIDRA 5 % SOLN Place 1 drop into both eyes daily.   No facility-administered encounter medications on file as of 02/24/2021.    Care Gaps: Zoster Vaccines- Shingrix, COVID-19 Vaccine,  Pneumococcal Vaccine 68-7 Years old   Lynann Bologna, CPA/CMA Clinical Pharmacist Assistant Phone: [phone]

## 2021-02-25 ENCOUNTER — Ambulatory Visit (INDEPENDENT_AMBULATORY_CARE_PROVIDER_SITE_OTHER): Payer: PPO

## 2021-02-25 DIAGNOSIS — F331 Major depressive disorder, recurrent, moderate: Secondary | ICD-10-CM

## 2021-02-25 DIAGNOSIS — F5101 Primary insomnia: Secondary | ICD-10-CM

## 2021-02-25 DIAGNOSIS — J418 Mixed simple and mucopurulent chronic bronchitis: Secondary | ICD-10-CM | POA: Diagnosis not present

## 2021-02-25 DIAGNOSIS — Z87891 Personal history of nicotine dependence: Secondary | ICD-10-CM

## 2021-02-25 DIAGNOSIS — R339 Retention of urine, unspecified: Secondary | ICD-10-CM

## 2021-02-25 MED ORDER — SERTRALINE HCL 50 MG PO TABS: 50.0000 mg | ORAL_TABLET | Freq: Every day | ORAL | 1 refills | Status: DC

## 2021-02-25 MED ORDER — POTASSIUM CHLORIDE CRYS ER 20 MEQ PO TBCR: 20.0000 meq | EXTENDED_RELEASE_TABLET | Freq: Every day | ORAL | 1 refills | Status: DC

## 2021-02-25 NOTE — Progress Notes (Signed)
Chronic Care Management Pharmacy Note  02/25/2021 Name:  Gabrielle Pineda MRN:  [id number] DOB:  1956-01-20  Subjective:  Summary: Patient reports she is doing well overall. She has 2 surgeries coming up next month. She continues to get very poor quality sleep and wakes up multiple times per night. She has continued to avoid tobacco use and reports her breathing is much better.  Recommendations/Changes made from today's visit: DECREASE Sertraline to 50 mg daily due to duplication of action between SSRI and SNRI. Anticipate tapering sertraline and switching to mirtazapine next visit  Plan: CPP follow-up in one month   Gabrielle Pineda is an 65 y.o. year old female who is a primary patient of Chrismon, Vickki Muff, PA-C.  The CCM team was consulted for assistance with disease management and care coordination needs.    Engaged with patient by telephone for follow up visit in response to provider referral for pharmacy case management and/or care coordination services.   Consent to Services:  The patient was given information about Chronic Care Management services, agreed to services, and gave verbal consent prior to initiation of services.  Please see initial visit note for detailed documentation.   Patient Care Team: Chrismon, Vickki Muff, PA-C as PCP - General (Family Medicine) Minna Merritts, MD as PCP - Cardiology (Cardiology) Manya Silvas, MD (Inactive) (Gastroenterology) Germaine Pomfret, [facility] (Pharmacist)  Recent office visits: 12/10/20: Patient presented to Vernie Murders, PA-C for follow-up.  11/08/20: Patient presented to Fenton Malling, PA-C for hospital follow-up. Diltiazem, hydoxyzine, trazodone stopped. Eszopiclone 3 mg started, prednisone 12 day course started.   Recent consult visits: 02/06/21: Patient presented to Dr. Jefm Bryant (Rheumatology) for follow-up. No medication changes noted.  11/20/20: Patient presented to Laurann Montana, NP (Cardiology) for  follow-up.   Hospital visits: 3/16-3/20: Patient was hospitalized at [facility] for acute respiratory failure and COPD exacerbation. Continue prednisone course for 2 more days. Stopped diltiazem, Started on Carvedilol 12.5 mg twice daily due to tachycardia. QTc 607 ms, holding aripiprazole, trazodone.   Objective:  Lab Results  Component Value Date   CREATININE 0.96 11/08/2020   BUN 7 (L) 11/08/2020   GFRNONAA 62 05/06/2020   GFRAA 72 05/06/2020   NA 140 11/08/2020   K 4.3 11/08/2020   CALCIUM 9.5 11/08/2020   CO2 20 11/08/2020   GLUCOSE 101 (H) 11/08/2020    Lab Results  Component Value Date/Time   HGBA1C 5.0 04/14/2018 11:24 AM   HGBA1C 5.2 04/16/2017 11:01 AM    Last diabetic Eye exam: No results found for: HMDIABEYEEXA  Last diabetic Foot exam: No results found for: HMDIABFOOTEX   Lab Results  Component Value Date   CHOL 172 04/19/2020   HDL 56 04/19/2020   LDLCALC 98 04/19/2020   TRIG 102 04/19/2020   CHOLHDL 3.1 04/19/2020    Hepatic Function Latest Ref Rng & Units 05/06/2020 04/19/2020 08/09/2019  Total Protein 6.0 - 8.5 g/dL - 6.4 6.3(L)  Albumin 3.8 - 4.8 g/dL 4.2 4.3 4.0  AST 0 - 40 IU/L - 28 24  ALT 0 - 32 IU/L - 25 19  Alk Phosphatase 48 - 121 IU/L - 115 101  Total Bilirubin 0.0 - 1.2 mg/dL - <0.2 0.3  Bilirubin, Direct 0.00 - 0.40 mg/dL - - -    Lab Results  Component Value Date/Time   TSH 1.310 04/17/2019 10:37 AM   TSH 1.700 04/14/2018 11:24 AM    CBC Latest Ref Rng & Units 04/19/2020 08/09/2019 04/17/2019  WBC 3.4 -  10.8 x10E3/uL 6.7 6.7 5.6  Hemoglobin 11.1 - 15.9 g/dL 12.4 12.8 12.0  Hematocrit 34.0 - 46.6 % 36.8 39.6 36.4  Platelets 150 - 450 x10E3/uL 212 188 230    No results found for: VD25OH  Clinical ASCVD: No  The 10-year ASCVD risk score Mikey Bussing DC Jr., et al., 2013) is: 11.4%   Values used to calculate the score:     Age: 65 years     Sex: Female     Is Non-Hispanic African American: No     Diabetic: No     Tobacco smoker: Yes      Systolic Blood Pressure: 562 mmHg     Is BP treated: Yes     HDL Cholesterol: 56 mg/dL     Total Cholesterol: 172 mg/dL    Depression screen [facility] 2/9 01/01/2021 12/25/2020 11/08/2020  Decreased Interest 0 0 0  Down, Depressed, Hopeless 0 0 0  PHQ - 2 Score 0 0 0  Altered sleeping - - 0  Tired, decreased energy - - 1  Change in appetite - - 0  Feeling bad or failure about yourself  - - 0  Trouble concentrating - - 0  Moving slowly or fidgety/restless - - 0  Suicidal thoughts - - 0  PHQ-9 Score - - 1  Difficult doing work/chores - - Not difficult at all  Some recent data might be hidden    Social History   Tobacco Use  Smoking Status Former   Packs/day: 1.00   Years: 40.00   Pack years: 40.00   Types: Cigarettes   Quit date: 10/30/2020   Years since quitting: 0.3  Smokeless Tobacco Never   BP Readings from Last 3 Encounters:  01/01/21 (!) 154/91  12/25/20 131/83  12/10/20 122/69   Pulse Readings from Last 3 Encounters:  01/01/21 70  12/25/20 70  12/10/20 62   Wt Readings from Last 3 Encounters:  01/01/21 153 lb (69.4 kg)  12/25/20 153 lb (69.4 kg)  12/10/20 153 lb 12.8 oz (69.8 kg)   BMI Readings from Last 3 Encounters:  01/01/21 22.59 kg/m  12/25/20 22.59 kg/m  12/10/20 22.71 kg/m    Assessment/Interventions: Review of patient past medical history, allergies, medications, health status, including review of consultants reports, laboratory and other test data, was performed as part of comprehensive evaluation and provision of chronic care management services.   SDOH:  (Social Determinants of Health) assessments and interventions performed: Yes SDOH Interventions    Flowsheet Row Most Recent Value  SDOH Interventions   Financial Strain Interventions Intervention Not Indicated       SDOH Screenings   Alcohol Screen: Low Risk    Last Alcohol Screening Score (AUDIT): 0  Depression (PHQ2-9): Low Risk    PHQ-2 Score: 0  Financial Resource Strain: Low Risk     Difficulty of Paying Living Expenses: Not hard at all  Food Insecurity: Not on file  Housing: Not on file  Physical Activity: Not on file  Social Connections: Not on file  Stress: Not on file  Tobacco Use: Medium Risk   Smoking Tobacco Use: Former   Smokeless Tobacco Use: Never  Transportation Needs: No Transportation Needs   Lack of Transportation (Medical): No   Lack of Transportation (Non-Medical): No    CCM Care Plan  Allergies  Allergen Reactions   Plaquenil [Hydroxychloroquine] Rash    Medications Reviewed Today     Reviewed by Germaine Pomfret, [facility] (Pharmacist) on 01/27/21 at 1349  Med List Status: <None>  Medication Order Taking? Sig Documenting Provider Last Dose Status Informant  acyclovir ointment (ZOVIRAX) 5 % [id number]  Apply 1 application topically every 3 (three) hours. Fenton Malling M, PA-C  Active   ARIPiprazole (ABILIFY) 10 MG tablet [id number] Yes Take 1 tablet (10 mg total) by mouth daily. Virginia Crews, MD Taking Active   atorvastatin (LIPITOR) 40 MG tablet [id number] Yes Take 1 tablet (40 mg total) by mouth daily. [name], PA-C Taking Active   busPIRone (BUSPAR) 30 MG tablet [id number] Yes Take 1 tablet (30 mg total) by mouth 3 (three) times daily. Virginia Crews, MD Taking Active   carvedilol (COREG) 12.5 MG tablet [id number] Yes Take 1 tablet (12.5 mg total) by mouth 2 (two) times daily with a meal. Loel Dubonnet, NP Taking Active   Eszopiclone 3 MG TABS [id number] Yes Take 1 tablet (3 mg total) by mouth at bedtime. Take immediately before bedtime Chrismon, Vickki Muff, PA-C Taking Active   ezetimibe (ZETIA) 10 MG tablet [id number] Yes Take 1 tablet (10 mg total) by mouth daily. Fenton Malling M, PA-C Taking Active   fluticasone (FLONASE) 50 MCG/ACT nasal spray [id number]  Place 2 sprays into both nostrils daily. [name], PA-C  Active Self  Fluticasone-Umeclidin-Vilant (TRELEGY ELLIPTA) 100-62.5-25 MCG/INH AEPB  [id number] Yes Inhale 1 puff into the lungs daily. [name], [place] Taking Active   furosemide (LASIX) 20 MG tablet [id number]  TAKE 1 TABLET BY MOUTH ONCE DAILY AS NEEDED FOR FLUID [name], PA-C  Active   gabapentin (NEURONTIN) 600 MG tablet [id number] Yes Take 1 tablet (600 mg total) by mouth 3 (three) times daily. [name], MD Taking Active   Guaifenesin 1200 MG TB12 [id number]  Take 1,200 mg by mouth daily. [provider]  Active Self  ibuprofen (ADVIL) 200 MG tablet [id number]  Take 3 tablets (600 mg total) by mouth every 8 (eight) hours as needed. For pain Costella, Vista Mink, PA-C  Active   ipratropium (ATROVENT) 0.02 % nebulizer solution [id number] Yes Take 2.5 mLs (0.5 mg total) by nebulization 4 (four) times daily as needed for wheezing or shortness of breath. [name], PA-C Taking Active   leflunomide (ARAVA) 10 MG tablet [id number] Yes Take 10 mg by mouth at bedtime.  [provider] Taking Active Self  LINZESS 145 MCG CAPS capsule [id number] Yes Take 145 mcg by mouth daily before breakfast. [provider] Taking Active Self  meloxicam (MOBIC) 15 MG tablet [id number] Yes Take 1 tablet (15 mg total) by mouth at bedtime. Costella, Vista Mink, PA-C Taking Active   Multiple Vitamin (MULTIVITAMIN WITH MINERALS) TABS tablet [id number]  Take 1 tablet by mouth daily. [provider]  Active Self  Nutritional Supplements (ESTROVEN ENERGY PO) [id number]  Take 1 tablet by mouth daily.  [provider]  Active Self  omeprazole (PRILOSEC) 40 MG capsule [id number] Yes Take 1 capsule (40 mg total) by mouth 2 (two) times daily. [name], PA-C Taking Active   oxyCODONE (ROXICODONE) 5 MG immediate release tablet [id number]  Take 1 tablet (5 mg total) by mouth every 4 (four) hours as needed for severe pain. Costella, Vista Mink, PA-C  Active   potassium chloride SA (KLOR-CON) 20 MEQ tablet [id number] Yes Take 1 tablet (20 mEq  total) by mouth daily as needed (take with furosemide (fluid retention)). Virginia Crews, MD Taking Active   predniSONE (DELTASONE) 5 MG tablet [id number]  Taper down by 1 tablet by mouth daily starting at  6 day 1, 5 day 2, 4 day 3, 3 day 4, 2 day 5 and 1 day 6. Divide among meals and bedtime. Chrismon, Vickki Muff, PA-C  Active   sertraline (ZOLOFT) 100 MG tablet [id number] Yes Take 1 tablet (100 mg total) by mouth daily. [name], PA-C Taking Active   topiramate (TOPAMAX) 25 MG tablet [id number] Yes Take 1 tablet (25 mg total) by mouth 2 (two) times daily. [name], MD Taking Active   valACYclovir (VALTREX) 1000 MG tablet [id number]  TAKE 2 TABLETS BY MOUTH TWICE DAILY AS NEEDED FOR 5 DAYS [name], [place]  Active   venlafaxine XR (EFFEXOR-XR) 75 MG 24 hr capsule [id number] Yes TAKE 3 CAPSULES BY MOUTH ONCE DAILY WITH BREAKFAST [name], PA-C Taking Active   vitamin B-12 (CYANOCOBALAMIN) 1000 MCG tablet [id number]  Take 6,000 mcg by mouth daily. [provider]  Active Self  vitamin E 400 UNIT capsule [id number]  Take 1,600 Units by mouth daily. [provider]  Active Self  XIIDRA 5 % SOLN [id number]  Place 1 drop into both eyes daily. [provider]  Active Self  Med List Note Janett Billow, RN 09/07/17 1420): UDS 09/07/17            Patient Active Problem List   Diagnosis Date Noted   Chronic radicular lumbar pain 10/03/2020   Postlaminectomy syndrome, lumbar region 06/03/2020   Acute non-recurrent pansinusitis 03/12/2020   Moderate episode of recurrent major depressive disorder ([place]) 12/04/2019   Spondylosis of cervical region without myelopathy or radiculopathy 09/21/2019   DDD (degenerative disc disease), cervical 09/21/2019   Cervicalgia 09/21/2019   Cervical fusion syndrome 09/21/2019   Chronic pain syndrome 09/21/2019   Spinal stenosis, lumbar region, with neurogenic claudication 08/15/2019   History of  adenomatous polyp of colon 11/04/2017   Cervical radiculopathy 08/02/2017   Neuropathy 08/02/2017   Essential hypertension 07/28/2017   Closed compression fracture of L5 lumbar vertebra 07/07/2017   Sacral insufficiency fracture with routine healing 07/07/2017   COPD (chronic obstructive pulmonary disease) ([place]) 04/10/2015   Family history of malignant neoplasm of pancreas 04/03/2015   Hypercholesteremia 04/03/2015   Disorder of iron metabolism 04/03/2015   Weight loss 04/03/2015   Delayed onset of urination 04/03/2015   Acid reflux 10/11/2014   Closed fracture of distal phalanx of thumb 08/15/2014   Arthritis, degenerative 01/30/2014   Arthritis or polyarthritis, rheumatoid ([place]) 01/30/2014   Shortness of breath 12/22/2013   SMOKER 09/22/2010   Paroxysmal supraventricular tachycardia ([place]) 09/22/2010   CHEST PAIN UNSPECIFIED 09/22/2010   ELECTROCARDIOGRAM, ABNORMAL 09/22/2010   B-complex deficiency 09/09/2007   CN (constipation) 06/26/2007   Clinical depression 06/26/2007   Cold sore 06/26/2007   H/O alcohol abuse 06/26/2007   Cannot sleep 06/26/2007   Localized osteoarthrosis, hand 06/26/2007   Menopausal symptom 06/26/2007    Immunization History  Administered Date(s) Administered   Influenza Split 09/07/2012   Influenza,inj,Quad PF,6+ Mos 11/18/2015, 07/23/2016, 07/02/2017, 04/14/2018, 04/17/2019, 04/19/2020   Influenza-Unspecified 09/24/2014   PFIZER(Purple Top)SARS-COV-2 Vaccination 11/21/2019, 12/12/2019   Pneumococcal Polysaccharide-23 02/12/2011, 09/24/2014, 04/19/2020   Tdap 02/12/2011, 11/15/2015    Conditions to be addressed/monitored:  Hypertension, Hyperlipidemia, COPD, Depression, Tobacco use, and Insomnia, Chronic Pain, and Rheumatoid Arthritis  Care Plan : General Pharmacy (Adult)  Updates made by Germaine Pomfret, RPH since 02/25/2021 12:00 AM     Problem: Hypertension, Hyperlipidemia, COPD, Depression, Tobacco use and Chronic Pain, and Rheumatoid  Arthritis   Priority: High  Long-Range Goal: Patient-Specific Goal   Start Date: 11/25/2020  Expected End Date: 05/28/2021  This Visit's Progress: On track  Recent Progress: On track  Priority: High  Note:   Current Barriers:  Unable to independently afford treatment regimen Unable to achieve control of COPD  Suboptimal therapeutic regimen for depression  Pharmacist Clinical Goal(s):  Patient will verbalize ability to afford treatment regimen achieve control of COPD as evidenced by stable breathing, avoiding cigarette use, and absence of exacerbations adhere to plan to optimize therapeutic regimen for depression as evidenced by report of adherence to recommended medication management changes through collaboration with PharmD and provider.   Interventions: 1:1 collaboration with Chrismon, Simona Huh, PA-C regarding development and update of comprehensive plan of care as evidenced by provider attestation and co-signature Inter-disciplinary care team collaboration (see longitudinal plan of care) Comprehensive medication review performed; medication list updated in electronic medical record  Hypertension (BP goal <140/90) -Controlled -Current treatment: Carvedilol 12.5 mg twice daily  Furosemide 20 mg daily as needed  -Medications previously tried: NA  -Current home readings: NA -Denies hypotensive/hypertensive symptoms -Patient does report increased fatigue and lethargy since starting carvedilol, although patient does report poor sleep quality -Educated on Daily salt intake goal < 2300 mg; Exercise goal of 150 minutes per week; -Counseled to monitor BP at home weekly, document, and provide log at future appointments -Recommended to continue current medication  Hyperlipidemia: (LDL goal < 70) -Controlled -Current treatment: Atorvastatin 40 mg daily  Ezetimibe 10 mg daily  -Medications previously tried: NA  -Educated on Benefits of statin for ASCVD risk reduction; -Recommended  to continue current medication  COPD (Goal: control symptoms and prevent exacerbations) -Controlled -Current treatment  Ipratropium 0.02% nebulizer 2.5 mL every 4 hours Trelegy 1 puff daily  -Medications previously tried: NA  -Gold Grade: Gold 2 (FEV1 50-79%) -Current COPD Classification:  B (high sx, <2 exacerbations/yr) -MMRC/CAT score: NA -Pulmonary function testing: 71% (2017) -Exacerbations requiring treatment in last 6 months: yes -Breathing much improved since quitting smoking and daily use of Trelegy. Patient rarely needs to use nebulizer solution.  -Recommended to continue current medication Assessed patient finances. Trelegy patient assistance was denied  Depression/Anxiety/ Insomnia (Goal: Maintain stable mood) -Uncontrolled -Current treatment: Aripiprazole 10 mg daily  Buspirone 30 mg three times daily  Eszopiclone 3 mg at bedtime  Sertraline 100 mg daily  Venlafaxine XR 75 mg 3 capsules daily -Medications previously tried/failed: NA -PHQ9: 1 -GAD7: 9 -Able to fall asleep quickly, but often wakes up multiple times throughout the night -Connected with NA for mental health support -Educated on Benefits of medication for symptom control -Decrease Sertraline to 50 mg daily given DDI with venlafaxine. Anticipate taper next month and switching to mirtazpaine for better sleep control.   Tobacco use (Goal quit smoking) -Controlled -Previous quit attempts: Nicotine Patches (completed course)  -Current treatment  None -Patient smokes After 30 minutes of waking -Patient triggers include: finishing a meal  -Patient continues to have triggers to smoke, but so far has been motivated to stay away from it due to her lung condition -Counseled on importance of continued cessation of tobacco products, tools for managing cravings.   Patient Goals/Self-Care Activities Patient will:  - check blood pressure weekly, document, and provide at future appointments Utilize maintenance  inhaler daily  Follow Up Plan: Telephone follow up appointment with care management team member scheduled for:  03/28/2021 at 8:15 AM      Medication Assistance:  Application for Trelegy denied  Patient's preferred pharmacy is:  [facility] - [place], [place] - [address] [place] [address] Phone: [phone] Fax: [phone]  [address] [place], [place] [address] Phone: [phone] Fax: [phone]  Patient decided to: Utilize [facility] for medication synchronization, packaging and delivery   Patient obtains medications through Adherence Packaging  30 Days   Last adherence delivery included: Aripiprazole 10 mg tablet- Take one tablet by mouth daily (breakfast) Atorvastatin 40 mg tablet- Take one tablet by mouth daily (breakfast) Buspirone 30 mg tablet- Take one tablet by mouth three times daily (breakfast, lunch, bedtime) Carvedilol 12.5 mg tablet - Take on tablet by mouth twice daily (breakfast, bedtime)  Ezetimibe 10 mg tablet Take one tablet by mouth daily (breakfast) Eszopiclone 3 mg tablet - Take one tablet by mouth nightly (bedtime) Gabapentin 600 mg tablet- Take one tablet by mouth three times daily (breakfast, lunch bedtime) Leflunomide 10 mg tablet- Take one tablet by mouth once daily (bedtime) Linzess 145 mcg- Take one tablet by mouth daily (before breakfast) Meloxicam 15 mg tablet- Take one tablet by mouth once daily (bedtime)  Omeprazole 40 my- Take one capsule by mouth two times a day (breakfast, bedtime) Potassium 20 mEq - Take one capsule daily (breakfast)  Sertraline 100 mg tablet- Take one tablet by mouth daily (bedtime) Topiramate 25 mg tablet-Take one tablet by mouth twice daily (breakfast, bedtime)  Venlafaxine ER 75 mg capsule- Take three capsules by mouth daily (breakfast)  Patient is due for next adherence delivery on:  03/04/2021. Called patient and reviewed medications and coordinated delivery.  This delivery to include: Packs: Aripiprazole 10 mg tablet- Take one tablet by mouth daily (breakfast) Atorvastatin 40 mg tablet- Take one tablet by mouth daily (breakfast) Buspirone 30 mg tablet- Take one tablet by mouth three times daily (breakfast, lunch, bedtime) Carvedilol 12.5 mg tablet - Take on tablet by mouth twice daily (breakfast, bedtime)  Ezetimibe 10 mg tablet Take one tablet by mouth daily (breakfast) Eszopiclone 3 mg tablet - Take one tablet by mouth nightly (bedtime) Gabapentin 600 mg tablet- Take one tablet by mouth three times daily (breakfast, lunch bedtime) Leflunomide 10 mg tablet- Take one tablet by mouth once daily (bedtime) Linzess 145 mcg- Take one tablet by mouth daily (before breakfast) Meloxicam 15 mg tablet- Take one tablet by mouth once daily (bedtime)  Omeprazole 40 my- Take one capsule by mouth two times a day (breakfast, bedtime) Potassium 20 mEq - Take one capsule daily (breakfast)  Sertraline 50 mg tablet- Take one tablet by mouth daily (bedtime) Topiramate 25 mg tablet-Take one tablet by mouth twice daily (breakfast, bedtime)  Trelegy 1 puff daily  Venlafaxine ER 75 mg capsule- Take three capsules by mouth daily (breakfast)  Patient declined the following medications (meds) due to (reason) Ipratropium Bromide 0.02 solution- Inhale 2.67ms via nebulizer four times daily (PRN) Furosemide 20 mg tablet-Take one tablet by mouth once daily (PRN)  Confirmed delivery date of 03/04/21, advised patient that pharmacy will contact them the morning of delivery.  Care Plan and Follow Up Patient Decision:  Patient agrees to Care Plan and Follow-up.  Plan: Telephone follow up appointment with care management team member scheduled for:  03/28/2021 at 8:15 AM  AJunius Argyle PharmD, CPort [phone]

## 2021-02-25 NOTE — Patient Instructions (Signed)
Visit Information It was great speaking with you today!  Please let me know if you have any questions about our visit.   Goals Addressed             This Visit's Progress    Track and Manage My Triggers-COPD   On track    Timeframe:  Long-Range Goal Priority:  High Start Date: 11/25/2020                            Expected End Date:  05/27/2022                     Follow Up Date 06/27/2021   - avoid second hand smoke - eliminate smoking in my home - identify and remove indoor air pollutants - limit outdoor activity during cold weather    Why is this important?   Triggers are activities or things, like tobacco smoke or cold weather, that make your COPD (chronic obstructive pulmonary disease) flare-up.  Knowing these triggers helps you plan how to stay away from them.  When you cannot remove them, you can learn how to manage them.     Notes:          Patient Care Plan: General Pharmacy (Adult)     Problem Identified: Hypertension, Hyperlipidemia, COPD, Depression, Tobacco use and Chronic Pain, and Rheumatoid Arthritis   Priority: High     Long-Range Goal: Patient-Specific Goal   Start Date: 11/25/2020  Expected End Date: 05/28/2021  This Visit's Progress: On track  Recent Progress: On track  Priority: High  Note:   Current Barriers:  Unable to independently afford treatment regimen Unable to achieve control of COPD  Suboptimal therapeutic regimen for depression  Pharmacist Clinical Goal(s):  Patient will verbalize ability to afford treatment regimen achieve control of COPD as evidenced by stable breathing, avoiding cigarette use, and absence of exacerbations adhere to plan to optimize therapeutic regimen for depression as evidenced by report of adherence to recommended medication management changes through collaboration with PharmD and provider.   Interventions: 1:1 collaboration with Chrismon, Simona Huh, PA-C regarding development and update of comprehensive plan  of care as evidenced by provider attestation and co-signature Inter-disciplinary care team collaboration (see longitudinal plan of care) Comprehensive medication review performed; medication list updated in electronic medical record  Hypertension (BP goal <140/90) -Controlled -Current treatment: Carvedilol 12.5 mg twice daily  Furosemide 20 mg daily as needed  -Medications previously tried: NA  -Current home readings: NA -Denies hypotensive/hypertensive symptoms -Patient does report increased fatigue and lethargy since starting carvedilol, although patient does report poor sleep quality -Educated on Daily salt intake goal < 2300 mg; Exercise goal of 150 minutes per week; -Counseled to monitor BP at home weekly, document, and provide log at future appointments -Recommended to continue current medication  Hyperlipidemia: (LDL goal < 70) -Controlled -Current treatment: Atorvastatin 40 mg daily  Ezetimibe 10 mg daily  -Medications previously tried: NA  -Educated on Benefits of statin for ASCVD risk reduction; -Recommended to continue current medication  COPD (Goal: control symptoms and prevent exacerbations) -Controlled -Current treatment  Ipratropium 0.02% nebulizer 2.5 mL every 4 hours Trelegy 1 puff daily  -Medications previously tried: NA  -Gold Grade: Gold 2 (FEV1 50-79%) -Current COPD Classification:  B (high sx, <2 exacerbations/yr) -MMRC/CAT score: NA -Pulmonary function testing: 71% (2017) -Exacerbations requiring treatment in last 6 months: yes -Breathing much improved since quitting smoking and daily use of Trelegy. Patient  rarely needs to use nebulizer solution.  -Recommended to continue current medication Assessed patient finances. Trelegy patient assistance was denied  Depression/Anxiety/ Insomnia (Goal: Maintain stable mood) -Uncontrolled -Current treatment: Aripiprazole 10 mg daily  Buspirone 30 mg three times daily  Eszopiclone 3 mg at bedtime  Sertraline  100 mg daily  Venlafaxine XR 75 mg 3 capsules daily -Medications previously tried/failed: NA -PHQ9: 1 -GAD7: 9 -Able to fall asleep quickly, but often wakes up multiple times throughout the night -Connected with NA for mental health support -Educated on Benefits of medication for symptom control -Decrease Sertraline to 50 mg daily given DDI with venlafaxine. Anticipate taper next month and switching to mirtazpaine for better sleep control.   Tobacco use (Goal quit smoking) -Controlled -Previous quit attempts: Nicotine Patches (completed course)  -Current treatment  None -Patient smokes After 30 minutes of waking -Patient triggers include: finishing a meal  -Patient continues to have triggers to smoke, but so far has been motivated to stay away from it due to her lung condition -Counseled on importance of continued cessation of tobacco products, tools for managing cravings.   Patient Goals/Self-Care Activities Patient will:  - check blood pressure weekly, document, and provide at future appointments Utilize maintenance inhaler daily  Follow Up Plan: Telephone follow up appointment with care management team member scheduled for:  03/28/2021 at 8:15 AM      Patient agreed to services and verbal consent obtained.   The patient verbalized understanding of instructions, educational materials, and care plan provided today and declined offer to receive copy of patient instructions, educational materials, and care plan.   Junius Argyle, PharmD, [place] [phone]

## 2021-03-03 DIAGNOSIS — S92322D Displaced fracture of second metatarsal bone, left foot, subsequent encounter for fracture with routine healing: Secondary | ICD-10-CM | POA: Diagnosis not present

## 2021-03-03 DIAGNOSIS — S92332D Displaced fracture of third metatarsal bone, left foot, subsequent encounter for fracture with routine healing: Secondary | ICD-10-CM | POA: Diagnosis not present

## 2021-03-03 DIAGNOSIS — S92315A Nondisplaced fracture of first metatarsal bone, left foot, initial encounter for closed fracture: Secondary | ICD-10-CM | POA: Diagnosis not present

## 2021-03-06 ENCOUNTER — Other Ambulatory Visit: Payer: Self-pay

## 2021-03-06 ENCOUNTER — Encounter
Admission: RE | Admit: 2021-03-06 | Discharge: 2021-03-06 | Disposition: A | Discharge status: Home / Self Care | Payer: PPO | Source: Ambulatory Visit | Attending: Neurosurgery | Admitting: Neurosurgery

## 2021-03-06 DIAGNOSIS — Z01812 Encounter for preprocedural laboratory examination: Secondary | ICD-10-CM | POA: Diagnosis not present

## 2021-03-06 HISTORY — DX: Failed or difficult intubation, initial encounter: T88.4XXA

## 2021-03-06 HISTORY — DX: Essential (primary) hypertension: I10

## 2021-03-06 LAB — TYPE AND SCREEN
ABO/RH(D): O POS
Antibody Screen: NEGATIVE

## 2021-03-06 LAB — CBC
HCT: 35.2 % — ABNORMAL LOW (ref 36.0–46.0)
Hemoglobin: 11.1 g/dL — ABNORMAL LOW (ref 12.0–15.0)
MCH: 31.9 pg (ref 26.0–34.0)
MCHC: 31.5 g/dL (ref 30.0–36.0)
MCV: 101.1 fL — ABNORMAL HIGH (ref 80.0–100.0)
Platelets: 266 10*3/uL (ref 150–400)
RBC: 3.48 MIL/uL — ABNORMAL LOW (ref 3.87–5.11)
RDW: 12.6 % (ref 11.5–15.5)
WBC: 5.3 10*3/uL (ref 4.0–10.5)
nRBC: 0 % (ref 0.0–0.2)

## 2021-03-06 LAB — URINALYSIS, ROUTINE W REFLEX MICROSCOPIC
Bacteria, UA: NONE SEEN
Bilirubin Urine: NEGATIVE
Glucose, UA: NEGATIVE mg/dL
Ketones, ur: NEGATIVE mg/dL
Leukocytes,Ua: NEGATIVE
Nitrite: NEGATIVE
Protein, ur: NEGATIVE mg/dL
Specific Gravity, Urine: 1.005 (ref 1.005–1.030)
pH: 5 (ref 5.0–8.0)

## 2021-03-06 LAB — BASIC METABOLIC PANEL
Anion gap: 6 (ref 5–15)
BUN: 11 mg/dL (ref 8–23)
CO2: 23 mmol/L (ref 22–32)
Calcium: 9 mg/dL (ref 8.9–10.3)
Chloride: 111 mmol/L (ref 98–111)
Creatinine, Ser: 0.88 mg/dL (ref 0.44–1.00)
GFR, Estimated: >60 mL/min (ref >60)
Glucose, Bld: 96 mg/dL (ref 70–99)
Potassium: 3.4 mmol/L — ABNORMAL LOW (ref 3.5–5.1)
Sodium: 140 mmol/L (ref 135–145)

## 2021-03-06 LAB — SURGICAL PCR SCREEN
MRSA, PCR: POSITIVE — AB
Staphylococcus aureus: POSITIVE — AB

## 2021-03-06 NOTE — Patient Instructions (Signed)
Your procedure is scheduled on: 03/17/21 Report to [facility]. To find out your arrival time please call [phone] between 1PM - 3PM on 03/14/21.  Remember: Instructions that are not followed completely may result in serious medical risk, up to and including death, or upon the discretion of your surgeon and anesthesiologist your surgery may need to be rescheduled.     _X__ 1. Do not eat food after midnight the night before your procedure.                 No gum chewing or hard candies. You may drink clear liquids up to 2 hours                 before you are scheduled to arrive for your surgery- DO not drink clear                 liquids within 2 hours of the start of your surgery.                 Clear Liquids include:  water, apple juice without pulp, clear carbohydrate                 drink such as Clearfast or Gatorade, Black Coffee or Tea (Do not add                 anything to coffee or tea). Diabetics water only  __X__2.  On the morning of surgery brush your teeth with toothpaste and water, you                 may rinse your mouth with mouthwash if you wish.  Do not swallow any              toothpaste of mouthwash.     _X__ 3.  No Alcohol for 24 hours before or after surgery.   _X__ 4.  Do Not Smoke or use e-cigarettes For 24 Hours Prior to Your Surgery.                 Do not use any chewable tobacco products for at least 6 hours prior to                 surgery.  ____  5.  Bring all medications with you on the day of surgery if instructed.   __X__  6.  Notify your doctor if there is any change in your medical condition      (cold, fever, infections).     Do not wear jewelry, make-up, hairpins, clips or nail polish. Do not wear lotions, powders, or perfumes.  Do not shave 48 hours prior to surgery. Men may shave face and neck. Do not bring valuables to the hospital.    [facility] is not responsible for any belongings or  valuables.  Contacts, dentures/partials or body piercings may not be worn into surgery. Bring a case for your contacts, glasses or hearing aids, a denture cup will be supplied. Leave your suitcase in the car. After surgery it may be brought to your room. For patients admitted to the hospital, discharge time is determined by your treatment team.   Patients discharged the day of surgery will not be allowed to drive home.   Please read over the following fact sheets that you were given:   MRSA Information, CHG soap  __X__ Take these medicines the morning of surgery with A SIP OF WATER:  1. ARIPiprazole (ABILIFY) 10 MG tablet  2. atorvastatin (LIPITOR) 40 MG tablet  3. busPIRone (BUSPAR) 30 MG tablet  4. carvedilol (COREG) 12.5 MG tablet  5. ezetimibe (ZETIA) 10 MG tablet  6. gabapentin (NEURONTIN) 600 MG tablet  7. omeprazole (PRILOSEC) 40 MG capsule  8. sertraline (ZOLOFT) 50 MG tablet  9. topiramate (TOPAMAX) 25 MG tablet  10. venlafaxine XR (EFFEXOR-XR) 75 MG 24 hr capsule  ____ Fleet Enema (as directed)   __X__ Use CHG Soap/SAGE wipes as directed  __X__ Use inhalers on the day of surgery AND NEBULIZER  ____ Stop metformin/Janumet/Farxiga 2 days prior to surgery    ____ Take 1/2 of usual insulin dose the night before surgery. No insulin the morning          of surgery.   ____ Stop Blood Thinners Coumadin/Plavix/Xarelto/Pleta/Pradaxa/Eliquis/Effient/Aspirin  on   Or contact your Surgeon, Cardiologist or Medical Doctor regarding  ability to stop your blood thinners  __X__ Stop Anti-inflammatories 7 days before surgery such as Advil, Ibuprofen and Meloxicam, Motrin,  BC or Goodies Powder, Naprosyn, Naproxen, Aleve, Aspirin    __X__ Stop 7 days before surgery all herbal supplements, fish oil or vitamin E    ____ Bring C-Pap to the hospital.

## 2021-03-06 NOTE — Progress Notes (Signed)
  Perioperative Services  Abnormal Lab Notification  Date: 03/06/21  Name: Gabrielle Pineda MRN:   [id number]  Re: Abnormal labs noted during PAT appointment  Provider Notified: [name], MD Notification mode: Routed and/or faxed via [place] of concern: Lab Results  Component Value Date   STAPHAUREUS POSITIVE (A) 03/06/2021   MRSAPCR POSITIVE (A) 03/06/2021    Notes: Patient is scheduled for a THORACIC SPINAL CORD STIMULATOR (PERCUTANEOUS) & [place] on 03/17/2021. She is scheduled to receive CEFAZOLIN pre-operatively. Surgical PCR (+) for MRSA; see above.  PLANS:  Review renal function. Estimated Creatinine Clearance: 67.5 mL/min (by C-G formula based on SCr of 0.88 mg/dL). Review allergies. No documented allergy to vancomycin. CEFAZOLIN discontinued Order added for VANCOMYCIN 1 GRAM IV to preoperative prophylactic regimen.  Notify primary attending surgeon of (+) MRSA result and additional order placed for antibiotic by perioperative APP.   This is a Community education officer; no formal response is required.  Honor Loh, MSN, APRN, FNP-C, CEN [facility]  Peri-operative Services Nurse Practitioner Phone: [phone] 03/06/21 1:17 PM

## 2021-03-06 NOTE — Pre-Procedure Instructions (Signed)
pATIENT PROVIDED WRITTEN AND VERBAL INSTRUCTIONS

## 2021-03-17 ENCOUNTER — Ambulatory Visit: Payer: PPO | Admitting: Urgent Care

## 2021-03-17 ENCOUNTER — Encounter
Admission: RE | Disposition: A | Discharge status: Home / Self Care | Payer: Self-pay | Source: Home / Self Care | Attending: Neurosurgery

## 2021-03-17 ENCOUNTER — Other Ambulatory Visit: Payer: Self-pay

## 2021-03-17 ENCOUNTER — Ambulatory Visit: Payer: PPO

## 2021-03-17 ENCOUNTER — Ambulatory Visit
Admission: RE | Admit: 2021-03-17 | Discharge: 2021-03-17 | Disposition: A | Discharge status: Home / Self Care | Payer: PPO | Attending: Neurosurgery | Admitting: Neurosurgery

## 2021-03-17 ENCOUNTER — Ambulatory Visit: Payer: PPO | Admitting: Certified Registered"

## 2021-03-17 ENCOUNTER — Encounter: Payer: Self-pay | Admitting: Neurosurgery

## 2021-03-17 DIAGNOSIS — Z79899 Other long term (current) drug therapy: Secondary | ICD-10-CM | POA: Insufficient documentation

## 2021-03-17 DIAGNOSIS — Z791 Long term (current) use of non-steroidal anti-inflammatories (NSAID): Secondary | ICD-10-CM | POA: Insufficient documentation

## 2021-03-17 DIAGNOSIS — G894 Chronic pain syndrome: Secondary | ICD-10-CM | POA: Insufficient documentation

## 2021-03-17 DIAGNOSIS — Z981 Arthrodesis status: Secondary | ICD-10-CM | POA: Diagnosis not present

## 2021-03-17 DIAGNOSIS — Z888 Allergy status to other drugs, medicaments and biological substances status: Secondary | ICD-10-CM | POA: Insufficient documentation

## 2021-03-17 DIAGNOSIS — Z87891 Personal history of nicotine dependence: Secondary | ICD-10-CM | POA: Diagnosis not present

## 2021-03-17 DIAGNOSIS — Z419 Encounter for procedure for purposes other than remedying health state, unspecified: Secondary | ICD-10-CM

## 2021-03-17 DIAGNOSIS — E785 Hyperlipidemia, unspecified: Secondary | ICD-10-CM | POA: Diagnosis not present

## 2021-03-17 DIAGNOSIS — Z7951 Long term (current) use of inhaled steroids: Secondary | ICD-10-CM | POA: Insufficient documentation

## 2021-03-17 HISTORY — PX: SPINAL CORD STIMULATOR INSERTION: SHX5378

## 2021-03-17 LAB — POCT I-STAT, CHEM 8
BUN: 10 mg/dL (ref 8–23)
Calcium, Ion: 1.16 mmol/L (ref 1.15–1.40)
Chloride: 110 mmol/L (ref 98–111)
Creatinine, Ser: 0.8 mg/dL (ref 0.44–1.00)
Glucose, Bld: 116 mg/dL — ABNORMAL HIGH (ref 70–99)
HCT: 33 % — ABNORMAL LOW (ref 36.0–46.0)
Hemoglobin: 11.2 g/dL — ABNORMAL LOW (ref 12.0–15.0)
Potassium: 3.7 mmol/L (ref 3.5–5.1)
Sodium: 142 mmol/L (ref 135–145)
TCO2: 21 mmol/L — ABNORMAL LOW (ref 22–32)

## 2021-03-17 LAB — ABO/RH: ABO/RH(D): O POS

## 2021-03-17 SURGERY — INSERTION, SPINAL CORD STIMULATOR, LUMBAR
Anesthesia: General

## 2021-03-17 MED ORDER — VANCOMYCIN HCL 1000 MG IV SOLR
INTRAVENOUS | Status: DC | PRN
  Administered 2021-03-17: 1000 mg via TOPICAL

## 2021-03-17 MED ORDER — MIDAZOLAM HCL 2 MG/2ML IJ SOLN
INTRAMUSCULAR | Status: AC
  Filled 2021-03-17: qty 2

## 2021-03-17 MED ORDER — FENTANYL CITRATE (PF) 100 MCG/2ML IJ SOLN
INTRAMUSCULAR | Status: AC
  Administered 2021-03-17: 25 ug via INTRAVENOUS
  Filled 2021-03-17: qty 2

## 2021-03-17 MED ORDER — MIDAZOLAM HCL 2 MG/2ML IJ SOLN
INTRAMUSCULAR | Status: DC | PRN
  Administered 2021-03-17: 2 mg via INTRAVENOUS

## 2021-03-17 MED ORDER — HYDROCODONE-ACETAMINOPHEN 5-325 MG PO TABS
1.0000 | ORAL_TABLET | Freq: Once | ORAL | Status: AC
  Administered 2021-03-17: 1 via ORAL

## 2021-03-17 MED ORDER — LIDOCAINE HCL (CARDIAC) PF 100 MG/5ML IV SOSY
PREFILLED_SYRINGE | INTRAVENOUS | Status: DC | PRN
  Administered 2021-03-17: 100 mg via INTRAVENOUS

## 2021-03-17 MED ORDER — LACTATED RINGERS IV SOLN: INTRAVENOUS | Status: DC

## 2021-03-17 MED ORDER — ONDANSETRON HCL 4 MG/2ML IJ SOLN: 4.0000 mg | Freq: Once | INTRAMUSCULAR | Status: DC | PRN

## 2021-03-17 MED ORDER — PROPOFOL 1000 MG/100ML IV EMUL
INTRAVENOUS | Status: AC
  Filled 2021-03-17: qty 100

## 2021-03-17 MED ORDER — CEFAZOLIN SODIUM-DEXTROSE 2-4 GM/100ML-% IV SOLN
INTRAVENOUS | Status: AC
  Filled 2021-03-17: qty 100

## 2021-03-17 MED ORDER — VANCOMYCIN HCL 1000 MG IV SOLR: 1000.0000 mg | Freq: Once | INTRAVENOUS | Status: DC

## 2021-03-17 MED ORDER — ROCURONIUM BROMIDE 100 MG/10ML IV SOLN
INTRAVENOUS | Status: DC | PRN
  Administered 2021-03-17: 10 mg via INTRAVENOUS

## 2021-03-17 MED ORDER — DEXAMETHASONE SODIUM PHOSPHATE 10 MG/ML IJ SOLN
INTRAMUSCULAR | Status: DC | PRN
  Administered 2021-03-17: 10 mg via INTRAVENOUS

## 2021-03-17 MED ORDER — SUCCINYLCHOLINE CHLORIDE 200 MG/10ML IV SOSY
PREFILLED_SYRINGE | INTRAVENOUS | Status: DC | PRN
  Administered 2021-03-17: 100 mg via INTRAVENOUS

## 2021-03-17 MED ORDER — BUPIVACAINE-EPINEPHRINE (PF) 0.5% -1:200000 IJ SOLN
INTRAMUSCULAR | Status: DC | PRN
  Administered 2021-03-17: 18 mL

## 2021-03-17 MED ORDER — FENTANYL CITRATE (PF) 100 MCG/2ML IJ SOLN
25.0000 ug | INTRAMUSCULAR | Status: DC | PRN
  Administered 2021-03-17 (×3): 25 ug via INTRAVENOUS

## 2021-03-17 MED ORDER — HYDROCODONE-ACETAMINOPHEN 5-325 MG PO TABS
ORAL_TABLET | ORAL | Status: AC
  Filled 2021-03-17: qty 1

## 2021-03-17 MED ORDER — EPHEDRINE SULFATE 50 MG/ML IJ SOLN
INTRAMUSCULAR | Status: DC | PRN
  Administered 2021-03-17 (×2): 10 mg via INTRAVENOUS

## 2021-03-17 MED ORDER — PROPOFOL 10 MG/ML IV BOLUS
INTRAVENOUS | Status: DC | PRN
  Administered 2021-03-17: 200 mg via INTRAVENOUS

## 2021-03-17 MED ORDER — 0.9 % SODIUM CHLORIDE (POUR BTL) OPTIME
TOPICAL | Status: DC | PRN
  Administered 2021-03-17: 1000 mL

## 2021-03-17 MED ORDER — SODIUM CHLORIDE 0.9 % IV SOLN
INTRAVENOUS | Status: DC | PRN
  Administered 2021-03-17: 25 ug/min via INTRAVENOUS

## 2021-03-17 MED ORDER — ACETAMINOPHEN 10 MG/ML IV SOLN
INTRAVENOUS | Status: AC
  Filled 2021-03-17: qty 100

## 2021-03-17 MED ORDER — CHLORHEXIDINE GLUCONATE 0.12 % MT SOLN
OROMUCOSAL | Status: AC
  Administered 2021-03-17: 15 mL via OROMUCOSAL
  Filled 2021-03-17: qty 15

## 2021-03-17 MED ORDER — CEFAZOLIN SODIUM-DEXTROSE 2-4 GM/100ML-% IV SOLN
2.0000 g | Freq: Once | INTRAVENOUS | Status: AC
  Administered 2021-03-17: 2 g via INTRAVENOUS

## 2021-03-17 MED ORDER — REMIFENTANIL HCL 1 MG IV SOLR
INTRAVENOUS | Status: AC
  Filled 2021-03-17: qty 1000

## 2021-03-17 MED ORDER — FENTANYL CITRATE (PF) 100 MCG/2ML IJ SOLN
INTRAMUSCULAR | Status: AC
  Filled 2021-03-17: qty 2

## 2021-03-17 MED ORDER — REMIFENTANIL HCL 1 MG IV SOLR
INTRAVENOUS | Status: DC | PRN
  Administered 2021-03-17: .15 ug/kg/min via INTRAVENOUS

## 2021-03-17 MED ORDER — PHENYLEPHRINE HCL (PRESSORS) 10 MG/ML IV SOLN
INTRAVENOUS | Status: AC
  Filled 2021-03-17: qty 1

## 2021-03-17 MED ORDER — VANCOMYCIN HCL IN DEXTROSE 1-5 GM/200ML-% IV SOLN: 1000.0000 mg | INTRAVENOUS | Status: AC

## 2021-03-17 MED ORDER — PROPOFOL 10 MG/ML IV BOLUS
INTRAVENOUS | Status: AC
  Filled 2021-03-17: qty 20

## 2021-03-17 MED ORDER — ACETAMINOPHEN 10 MG/ML IV SOLN
INTRAVENOUS | Status: DC | PRN
  Administered 2021-03-17: 1000 mg via INTRAVENOUS

## 2021-03-17 MED ORDER — FENTANYL CITRATE (PF) 100 MCG/2ML IJ SOLN
INTRAMUSCULAR | Status: DC | PRN
  Administered 2021-03-17 (×2): 50 ug via INTRAVENOUS

## 2021-03-17 MED ORDER — LACTATED RINGERS IV SOLN: 1000.0000 mL | Freq: Once | INTRAVENOUS | Status: DC

## 2021-03-17 MED ORDER — CHLORHEXIDINE GLUCONATE 0.12 % MT SOLN: 15.0000 mL | Freq: Once | OROMUCOSAL | Status: AC

## 2021-03-17 MED ORDER — ORAL CARE MOUTH RINSE: 15.0000 mL | Freq: Once | OROMUCOSAL | Status: AC

## 2021-03-17 MED ORDER — GLYCOPYRROLATE 0.2 MG/ML IJ SOLN
INTRAMUSCULAR | Status: DC | PRN
  Administered 2021-03-17: .2 mg via INTRAVENOUS

## 2021-03-17 MED ORDER — BUTALBITAL-APAP-CAFFEINE 50-325-40 MG PO TABS: 1.0000 | ORAL_TABLET | Freq: Three times a day (TID) | ORAL | 0 refills | Status: AC | PRN

## 2021-03-17 MED ORDER — VANCOMYCIN HCL IN DEXTROSE 1-5 GM/200ML-% IV SOLN
INTRAVENOUS | Status: AC
  Administered 2021-03-17: 1000 mg via INTRAVENOUS
  Filled 2021-03-17: qty 200

## 2021-03-17 MED ORDER — VASOPRESSIN 20 UNIT/ML IV SOLN
INTRAVENOUS | Status: DC | PRN
  Administered 2021-03-17: 2 [IU] via INTRAVENOUS

## 2021-03-17 MED ORDER — ONDANSETRON HCL 4 MG/2ML IJ SOLN
INTRAMUSCULAR | Status: DC | PRN
  Administered 2021-03-17 (×2): 4 mg via INTRAVENOUS

## 2021-03-17 MED ORDER — OXYCODONE HCL 5 MG PO TABS
ORAL_TABLET | ORAL | Status: AC
  Filled 2021-03-17: qty 1

## 2021-03-17 MED ORDER — HYDROCODONE-ACETAMINOPHEN 5-325 MG PO TABS: 1.0000 | ORAL_TABLET | Freq: Three times a day (TID) | ORAL | 0 refills | Status: AC | PRN

## 2021-03-17 SURGICAL SUPPLY — 48 items
ANCH LD 4 SET SPNL CORD STM (Anchor) ×1 IMPLANT
ANCHOR CLICK (Anchor) ×1 IMPLANT
ANCHOR CLIK NEURO F/LEAD 2 (Anchor) ×1 IMPLANT
APL PRP STRL LF DISP 70% ISPRP (MISCELLANEOUS) ×2
CANISTER SUCT 1200ML W/VALVE (MISCELLANEOUS) IMPLANT
CHLORAPREP W/TINT 26 (MISCELLANEOUS) ×4 IMPLANT
CONTROL REMOTE FREELINK ALPHA (NEUROSURGERY SUPPLIES) ×2 IMPLANT
COUNTER NEEDLE 20/40 LG (NEEDLE) ×2 IMPLANT
COVER LIGHT HANDLE STERIS (MISCELLANEOUS) ×4 IMPLANT
DEVICE FIXATE SUTURING DUAL (MISCELLANEOUS) ×2 IMPLANT
DRAPE C-ARM XRAY 36X54 (DRAPES) ×4 IMPLANT
DRAPE C-ARMOR (DRAPES) IMPLANT
DRAPE LAPAROTOMY 100X77 ABD (DRAPES) ×2 IMPLANT
DRAPE SURG 17X11 SM STRL (DRAPES) ×2 IMPLANT
DRSG TEGADERM 4X4.75 (GAUZE/BANDAGES/DRESSINGS) ×2 IMPLANT
ELECT CAUTERY BLADE TIP 2.5 (TIP) ×2
ELECTRODE CAUTERY BLDE TIP 2.5 (TIP) ×1 IMPLANT
GAUZE 4X4 16PLY ~~LOC~~+RFID DBL (SPONGE) ×4 IMPLANT
GAUZE SPONGE 4X4 12PLY STRL (GAUZE/BANDAGES/DRESSINGS) ×2 IMPLANT
GLOVE SRG 8 PF TXTR STRL LF DI (GLOVE) ×1 IMPLANT
GLOVE SURG SYN 7.0 (GLOVE) IMPLANT
GLOVE SURG SYN 7.5  E (GLOVE) ×1
GLOVE SURG SYN 7.5 E (GLOVE) ×1 IMPLANT
GLOVE SURG SYN 8.0 (GLOVE) ×2 IMPLANT
GLOVE SURG UNDER POLY LF SZ7 (GLOVE) ×4 IMPLANT
GLOVE SURG UNDER POLY LF SZ8 (GLOVE) ×2
GOWN SRG XL LVL 3 NONREINFORCE (GOWNS) ×1 IMPLANT
GOWN STRL NON-REIN TWL XL LVL3 (GOWNS) ×2
GRADUATE 1200CC STRL 31836 (MISCELLANEOUS) ×2 IMPLANT
KIT CHARGING (KITS) ×1
KIT CHARGING PRECISION NEURO (KITS) ×1 IMPLANT
KIT IPG ALPHA WAVEWRITER (Stimulator) ×2 IMPLANT
KIT LEAD 50CM (Lead) ×4 IMPLANT
KIT TURNOVER KIT A (KITS) ×2 IMPLANT
MANIFOLD NEPTUNE II (INSTRUMENTS) ×2 IMPLANT
MARKER SKIN DUAL TIP RULER LAB (MISCELLANEOUS) ×4 IMPLANT
NS IRRIG 1000ML POUR BTL (IV SOLUTION) ×2 IMPLANT
PACK LAMINECTOMY NEURO (CUSTOM PROCEDURE TRAY) ×2 IMPLANT
PAD ARMBOARD 7.5X6 YLW CONV (MISCELLANEOUS) ×4 IMPLANT
SUT ETHILON 3-0 FS-10 30 BLK (SUTURE) ×4
SUT POLYSORB 2-0 5X18 GS-10 (SUTURE) ×8 IMPLANT
SUT SILK 2 0 SH (SUTURE) ×8 IMPLANT
SUT VIC AB 0 CT1 18XCR BRD 8 (SUTURE) ×2 IMPLANT
SUT VIC AB 0 CT1 8-18 (SUTURE) ×4
SUTURE EHLN 3-0 FS-10 30 BLK (SUTURE) ×2 IMPLANT
TOOL LONG TUNNEL (SPINAL CORD STIMULATOR) ×4 IMPLANT
TOWEL OR 17X26 4PK STRL BLUE (TOWEL DISPOSABLE) ×4 IMPLANT
WRENCH HEX 7.6 (SPINAL CORD STIMULATOR) ×2 IMPLANT

## 2021-03-17 NOTE — H&P (Signed)
Gabrielle Pineda is an 65 y.o. female.   Chief Complaint: Chronic pain syndrome HPI: Gabrielle Pineda is here after a successful SCS trial for back and leg pain. We discussed permanent implant and she wishes to proceed.   Past Medical History:  Diagnosis Date   Alcohol abuse    Anemia    Anxiety    Arthritis    Cervicalgia    Cirrhosis ([place]) 1994   COPD (chronic obstructive pulmonary disease) ([place])    Depression    Difficult intubation    has plates and screws in neck   Dyspnea    GERD (gastroesophageal reflux disease)    Headache    Heart murmur    on heard when pt is lying   Hypertension    Other and unspecified hyperlipidemia    Tachycardia    d/t questionable anxiety happens every 5- 10 years, sees [facility]    Past Surgical History:  Procedure Laterality Date   ANTERIOR CERVICAL DECOMP/DISCECTOMY FUSION  2012, 2015, 2018   x3   AUGMENTATION MAMMAPLASTY Bilateral [place]  11/11/2011   Procedure: CARPAL TUNNEL RELEASE;  Surgeon: Floyce Stakes, MD;  Location: [facility];  Service: Neurosurgery;  Laterality: Right;  Right Median Nerve Decompression   CARPAL TUNNEL RELEASE  2013   COLONOSCOPY WITH PROPOFOL N/A 11/01/2017   Procedure: COLONOSCOPY WITH PROPOFOL;  Surgeon: Manya Silvas, MD;  Location: [facility] ENDOSCOPY;  Service: Endoscopy;  Laterality: N/A;   ESOPHAGOGASTRODUODENOSCOPY     LUMBAR LAMINECTOMY/DECOMPRESSION MICRODISCECTOMY N/A 08/15/2019   Procedure: Lumbar three to Sacral one Decompressive lumbar laminectomy;  Surgeon: Erline Levine, MD;  Location: [place];  Service: Neurosurgery;  Laterality: N/A;   neck disc surgery     plates and screws in neck x 2   ROTATOR CUFF REPAIR  06/16/2016   right shoulder    ROTATOR CUFF REPAIR     TONSILLECTOMY AND ADENOIDECTOMY  1973     Family History  Problem Relation Age of Onset   Pancreatic cancer Father    Alcohol abuse Father    Alcohol abuse  Mother    Bipolar disorder Mother    Suicidality Mother    Anesthesia problems Neg Hx    Breast cancer Neg Hx    Social History:  reports that she quit smoking about 4 months ago. Her smoking use included cigarettes. She has a 40.00 pack-year smoking history. She has never used smokeless tobacco. She reports that she does not drink alcohol and does not use drugs.  Allergies:  Allergies  Allergen Reactions   Plaquenil [Hydroxychloroquine] Rash    Medications Prior to Admission  Medication Sig Dispense Refill   acyclovir ointment (ZOVIRAX) 5 % Apply 1 application topically every 3 (three) hours. (Patient taking differently: Apply 1 application topically every 3 (three) hours as needed (fever blisters).) 30 g 3   ARIPiprazole (ABILIFY) 10 MG tablet Take 1 tablet (10 mg total) by mouth daily. 90 tablet 1   atorvastatin (LIPITOR) 40 MG tablet Take 1 tablet (40 mg total) by mouth daily. 90 tablet 3   busPIRone (BUSPAR) 30 MG tablet Take 1 tablet (30 mg total) by mouth 3 (three) times daily. 270 tablet 1   carvedilol (COREG) 12.5 MG tablet Take 1 tablet (12.5 mg total) by mouth 2 (two) times daily with a meal. 60 tablet 5   Eszopiclone 3 MG TABS Take 1 tablet (3 mg total) by mouth at  bedtime. Take immediately before bedtime 30 tablet 5   ezetimibe (ZETIA) 10 MG tablet Take 1 tablet (10 mg total) by mouth daily. 90 tablet 3   fluticasone (FLONASE) 50 MCG/ACT nasal spray Place 2 sprays into both nostrils daily. (Patient taking differently: Place 2 sprays into both nostrils daily as needed for allergies.) 16 g 6   Fluticasone-Umeclidin-Vilant (TRELEGY ELLIPTA) 100-62.5-25 MCG/INH AEPB Inhale 1 puff into the lungs daily. 60 each 11   furosemide (LASIX) 20 MG tablet TAKE 1 TABLET BY MOUTH ONCE DAILY AS NEEDED FOR FLUID (Patient taking differently: Take 20 mg by mouth daily as needed for edema.) 90 tablet 3   gabapentin (NEURONTIN) 600 MG tablet Take 1 tablet (600 mg total) by mouth 3 (three) times  daily. 270 tablet 2   Guaifenesin 1200 MG TB12 Take 1,200 mg by mouth daily as needed (congestion).     ibuprofen (ADVIL) 200 MG tablet Take 400 mg by mouth every 6 (six) hours as needed for moderate pain or headache.     ipratropium (ATROVENT) 0.02 % nebulizer solution Take 2.5 mLs (0.5 mg total) by nebulization 4 (four) times daily as needed for wheezing or shortness of breath. 2.5 mL 5   leflunomide (ARAVA) 10 MG tablet Take 10 mg by mouth at bedtime.      LINZESS 145 MCG CAPS capsule Take 145 mcg by mouth daily before breakfast.     meloxicam (MOBIC) 15 MG tablet Take 1 tablet (15 mg total) by mouth at bedtime.     Multiple Vitamin (MULTIVITAMIN WITH MINERALS) TABS tablet Take 1 tablet by mouth daily.     Nutritional Supplements (ESTROVEN ENERGY PO) Take 1 tablet by mouth daily.      omeprazole (PRILOSEC) 40 MG capsule Take 1 capsule (40 mg total) by mouth 2 (two) times daily. 180 capsule 3   potassium chloride SA (KLOR-CON) 20 MEQ tablet Take 1 tablet (20 mEq total) by mouth daily with breakfast. 90 tablet 1   sertraline (ZOLOFT) 50 MG tablet Take 1 tablet (50 mg total) by mouth daily. 30 tablet 1   topiramate (TOPAMAX) 25 MG tablet Take 1 tablet (25 mg total) by mouth 2 (two) times daily. 60 tablet 11   valACYclovir (VALTREX) 1000 MG tablet TAKE 2 TABLETS BY MOUTH TWICE DAILY AS NEEDED FOR 5 DAYS (Patient taking differently: Take 2 g by mouth See admin instructions. Take 2 g twice daily for 5 days as needed for fever blisters) 30 tablet 11   venlafaxine XR (EFFEXOR-XR) 75 MG 24 hr capsule TAKE 3 CAPSULES BY MOUTH ONCE DAILY WITH BREAKFAST (Patient taking differently: Take 225 mg by mouth daily with breakfast.) 270 capsule 3   vitamin B-12 (CYANOCOBALAMIN) 1000 MCG tablet Take 6,000 mcg by mouth daily.     vitamin E 400 UNIT capsule Take 1,600 Units by mouth daily.     XIIDRA 5 % SOLN Place 1 drop into both eyes daily.      No results found for this or any previous visit (from the past 48  hour(s)). No results found.  Review of Systems  Constitutional: Negative.   HENT: Negative.    Eyes: Negative.   Respiratory: Negative.    Cardiovascular: Negative.   Gastrointestinal: Negative.   Genitourinary: Negative.   Musculoskeletal:  Positive for back pain.  Skin: Negative.     Blood pressure (!) 143/75, pulse 72, temperature (!) 97.3 F (36.3 C), temperature source Temporal, resp. rate 18, height '5\' 9"'$  (1.753 m), weight 68 kg, SpO2  100 %. Physical Exam  Awake, alert 5/5 strength in lower extremities Sensation intact to light touch CV: Regular rate and rhythm Pulm: Clear to auscultation  Assessment/Plan Proceed with permanent thoracic SCS implant  [name], MD 03/17/2021, 6:52 AM

## 2021-03-17 NOTE — Transfer of Care (Signed)
Immediate Anesthesia Transfer of Care Note  Patient: Gabrielle Pineda  Procedure(s) Performed: THORACIC SPINAL CORD STIMULATOR (PERCUTANEOUS) & PULSE GENERATOR PLACEMENT  Patient Location: PACU  Anesthesia Type:General  Level of Consciousness: awake, drowsy and patient cooperative  Airway & Oxygen Therapy: Patient Spontanous Breathing and Patient connected to face mask oxygen  Post-op Assessment: Report given to RN and Post -op Vital signs reviewed and stable  Post vital signs: Reviewed and stable  Last Vitals:  Vitals Value Taken Time  BP 145/86 03/17/21 0916  Temp    Pulse 85 03/17/21 0919  Resp 30 03/17/21 0919  SpO2 100 % 03/17/21 0919  Vitals shown include unvalidated device data.  Last Pain:  Vitals:   03/17/21 0636  TempSrc: Temporal  PainSc: 9          Complications: No notable events documented.

## 2021-03-17 NOTE — Interval H&P Note (Signed)
History and Physical Interval Note:  03/17/2021 6:56 AM  Gabrielle Pineda  has presented today for surgery, with the diagnosis of chronic pain syndrome g89.4.  The various methods of treatment have been discussed with the patient and family. After consideration of risks, benefits and other options for treatment, the patient has consented to  Procedure(s): [place] (PERCUTANEOUS) & [place] (N/A) as a surgical intervention.  The patient's history has been reviewed, patient examined, no change in status, stable for surgery.  I have reviewed the patient's chart and labs.  Questions were answered to the patient's satisfaction.     [name]

## 2021-03-17 NOTE — Progress Notes (Signed)
Patient was d/c to home without urinating in error therefore I called her at home to ask her if she could alert me that she has urinated and she informed me she urinated as soon as she got into her home and that she felt her bladder was empty. I told her to call if she has issues and if she felt she could not urinate to go to the ED.

## 2021-03-17 NOTE — Anesthesia Preprocedure Evaluation (Signed)
Anesthesia Evaluation  Patient identified by MRN, date of birth, ID band Patient awake    Reviewed: Allergy & Precautions, H&P , NPO status , Patient's Chart, lab work & pertinent test results, reviewed documented beta blocker date and time   Airway Mallampati: III  TM Distance: >3 FB Neck ROM: full    Dental  (+) Teeth Intact, Poor Dentition   Pulmonary shortness of breath, COPD,  COPD inhaler, former smoker,    Pulmonary exam normal        Cardiovascular Exercise Tolerance: Poor hypertension, On Medications Normal cardiovascular exam+ Valvular Problems/Murmurs  Rhythm:regular Rate:Normal     Neuro/Psych  Headaches, PSYCHIATRIC DISORDERS Anxiety Depression  Neuromuscular disease    GI/Hepatic Neg liver ROS, GERD  Medicated,  Endo/Other  negative endocrine ROS  Renal/GU negative Renal ROS  negative genitourinary   Musculoskeletal   Abdominal   Peds  Hematology  (+) Blood dyscrasia, anemia ,   Anesthesia Other Findings Past Medical History: No date: Alcohol abuse No date: Anemia No date: Anxiety No date: Arthritis No date: Cervicalgia 1994: Cirrhosis ([place]) No date: COPD (chronic obstructive pulmonary disease) ([facility]) No date: Depression No date: Difficult intubation     Comment:  has plates and screws in neck No date: Dyspnea No date: GERD (gastroesophageal reflux disease) No date: Headache No date: Heart murmur     Comment:  on heard when pt is lying No date: Hypertension No date: Other and unspecified hyperlipidemia No date: Tachycardia     Comment:  d/t questionable anxiety happens every 5- 10 years, sees              [facility] Past Surgical History: 2012, 2015, 2018: ANTERIOR CERVICAL DECOMP/DISCECTOMY FUSION     Comment:  x3 1982: AUGMENTATION MAMMAPLASTY; Bilateral 1981: BREAST ENHANCEMENT SURGERY 11/11/2011: CARPAL TUNNEL RELEASE     Comment:  Procedure: CARPAL TUNNEL RELEASE;  Surgeon:  Floyce Stakes, MD;  Location: [facility];  Service:               Neurosurgery;  Laterality: Right;  Right Median Nerve               Decompression 2013: CARPAL TUNNEL RELEASE 11/01/2017: COLONOSCOPY WITH PROPOFOL; N/A     Comment:  Procedure: COLONOSCOPY WITH PROPOFOL;  Surgeon: Manya Silvas, MD;  Location: [facility] ENDOSCOPY;  Service:               Endoscopy;  Laterality: N/A; No date: ESOPHAGOGASTRODUODENOSCOPY 08/15/2019: LUMBAR LAMINECTOMY/DECOMPRESSION MICRODISCECTOMY; N/A     Comment:  Procedure: Lumbar three to Sacral one Decompressive               lumbar laminectomy;  Surgeon: Erline Levine, MD;                Location: [place];  Service: Neurosurgery;  Laterality:               N/A; No date: neck disc surgery     Comment:  plates and screws in neck x 2 06/16/2016: ROTATOR CUFF REPAIR     Comment:  right shoulder  No date: ROTATOR CUFF REPAIR 1973 : TONSILLECTOMY AND ADENOIDECTOMY BMI    Body Mass Index: 22.15 kg/m     Reproductive/Obstetrics negative OB ROS  Anesthesia Physical Anesthesia Plan  ASA: 3  Anesthesia Plan: General ETT   Post-op Pain Management:    Induction:   PONV Risk Score and Plan: 4 or greater  Airway Management Planned: Video Laryngoscope Planned  Additional Equipment:   Intra-op Plan:   Post-operative Plan:   Informed Consent: I have reviewed the patients History and Physical, chart, labs and discussed the procedure including the risks, benefits and alternatives for the proposed anesthesia with the patient or authorized representative who has indicated his/her understanding and acceptance.     Dental Advisory Given  Plan Discussed with: CRNA  Anesthesia Plan Comments:         Anesthesia Quick Evaluation

## 2021-03-17 NOTE — Discharge Instructions (Addendum)
NEUROSURGERY DISCHARGE INSTRUCTIONS  Admission diagnosis: chronic pain syndrome g89.4  Operative procedure: Placement of Spinal cord stimulator   What to do after you leave the hospital:  Recommended diet: regular diet. Increase protein intake to promote wound healing.  Recommended activity: activity as tolerated and Pt to remain with HOB less than 30 degrees for 1 hour in PACU prior to discharged . No driving for 2 weeks.You should walk multiple times per day  Special Instructions  No straining, no heavy lifting > 10lbs x 4 weeks.  Keep incision area clean and dry. Ok to remove dressing and leave incision open to air on 03/18/21 May shower in 2 days. No baths or pools for 6 weeks.   You have sutures or staples that will be removed in clinic in 10-14 days.  Please take pain medications as directed. Take a stool softener if on pain medications  Hold non-steroidal anti-inflammatory medications such as Aleve and Ibuprofen for 5 days post-op.   Please Report any of the following: Nausea or Vomiting, Temperature is greater than 101.67F (38.1C) degrees, Dizziness, Abdominal Pain, Difficulty Breathing or Shortness of Breath, Inability to Eat, drink Fluids, or Take medications, Bleeding, swelling, or drainage from surgical incision sites, New numbness or weakness, and Bowel or bladder dysfunction to the neurosurgeon on call at [phone]  Additional Follow up appointments Please follow up with Cooper Render PA-C in [facility] as scheduled in 2-3 weeks   Please see below for scheduled appointments:  Future Appointments  Date Time Provider [place]  03/28/2021  8:15 AM [facility] PHARMACIST [facility]    AMBULATORY SURGERY  DISCHARGE INSTRUCTIONS   The drugs that you were given will stay in your system until tomorrow so for the next 24 hours you should not:  Drive an automobile Make any legal decisions Drink any alcoholic beverage   You may resume regular meals tomorrow.   Today it is better to start with liquids and gradually work up to solid foods.  You may eat anything you prefer, but it is better to start with liquids, then soup and crackers, and gradually work up to solid foods.   Please notify your doctor immediately if you have any unusual bleeding, trouble breathing, redness and pain at the surgery site, drainage, fever, or pain not relieved by medication.    Additional Instructions:        Please contact your physician with any problems or Same Day Surgery at [phone], Monday through Friday 6 am to 4 pm, or [place] at [facility] number at [phone].

## 2021-03-17 NOTE — Anesthesia Procedure Notes (Signed)
Procedure Name: Intubation Date/Time: 03/17/2021 7:32 AM Performed by: Kelton Pillar, CRNA Pre-anesthesia Checklist: Patient identified, Emergency Drugs available, Suction available and Patient being monitored Patient Re-evaluated:Patient Re-evaluated prior to induction Oxygen Delivery Method: Circle system utilized Preoxygenation: Pre-oxygenation with 100% oxygen Induction Type: IV induction Ventilation: Mask ventilation without difficulty Laryngoscope Size: McGraph and 3 Grade View: Grade I Tube type: Oral Tube size: 6.5 mm Number of attempts: 1 Airway Equipment and Method: Stylet and Oral airway Placement Confirmation: ETT inserted through vocal cords under direct vision, positive ETCO2, breath sounds checked- equal and bilateral and CO2 detector Secured at: 21 cm Tube secured with: Tape Dental Injury: Teeth and Oropharynx as per pre-operative assessment

## 2021-03-17 NOTE — Op Note (Signed)
Operative Note   SURGERY DATE:  03/17/2021   PRE-OP DIAGNOSIS:  Chronic pain syndrome   POST-OP DIAGNOSIS: Post-Op Diagnosis Codes: Chronic pain syndrome   Procedure(s) with comments: Percutaneous thoracic spinal cord stimulator lead placement Right flank pulse generator placement   SURGEON:    * Malen Gauze, MD   Cooper Render, PA - Asst   ANESTHESIA: General    OPERATIVE FINDINGS: Successful placement of thoracic spinal cord stimulator and pulse generator   Indication Gabrielle Pineda had a telephone consultation after having undergone a spinal cord stimulator trial and did well with greater than 75% relief of pain in her back and proximal legs. The patient wished to proceed to permanent implant to decrease medication use and achieve better pain control. Risks including damage to spinal cord, weakness, hematoma, infection, failure of pain relief, post-operative pain, need for revision, stroke, heart attack, pneumonia, and spinal cord injury were discussed.     Procedure The patient was brought to the operating room where vascular access was obtained and she was intubated by the anesthesia service. She was placed prone on gel rolls. Antibiotics were given. Fluoroscopy was used to confirm planned incision in lumbar area at the level of L2-3 in the midline.  An additional incision was planned in the right flank for the pulse generator placement.  The patient was prepped and draped in a sterile fashion. A hard time out was performed. Local anesthetic was instilled into planned incision sites.   The midline lumbar incision was opened and taken to the fascia using cautery.Next, a Touhy needle was used to insert in a paramedian approach to enter the interlaminar space between L1 and L2.  There was CSF seen at this level and the needle was removed and then directed at the T11/T12 level. Once loss of resistance was identified, this was confirmed with a metal stylette.  Next, the percutaneous  lead was passed in the rostral direction using fluoroscopy as guidance to keep in the midline.  This was passed without resistance to the level of the T8 vertebral body on the left.  Lateral views were obtained to confirm we were in the dorsal epidural space.  Next, the same procedure was performed contralaterally. We placed an additional lead on the right at T8, slightly offset and inferior to the previous lead to allow for better coverage. The leads were secured with anchors in the fascia.  The incision was irrigated and hemostasis obtained.    Next, the right flank incision was opened and taken to the fascia and inferior dissection to allow for a large enough pocket for the placement of the battery.  The incision was irrigated and hemostasis obtained.  Next, a tunneler was used to pass the electrode leads from the lumbar incision to the right flank.  There, the electrodes were attached to the pulse generator and impedances were found to be normal. The pulse generator was placed into the incision taking care to place the wires beneath the pulse generator.    A final fluoroscopic image was taken show good placement of percutaneous leads.  The battery and wires were secured to fascia with suture. Vancomycin powder was placed into the incisions.  Next, a combination of 0 and  2-0  Vicryls were used to close the incisions with Nylon on the skin in flank and lumbar midline.   Sterile dressings were applied. The patient was returned to supine position and the patient was seen to be moving all extremities symmetrically and was taken  to PACU for recovery. The family was updated and all questions answered.      ESTIMATED BLOOD LOSS:   20 cc   SPECIMENS None   IMPLANT KIT LEAD 50CM - [id number]  Inventory Item: KIT LEAD 50CM Serial no.: [id number] Model/Cat no.: [id number]  Implant name: KIT LEAD 50CM - [id number] Laterality: N/A Area: Spine Thoracic  Manufacturer: [employer] Date of Manufacture:     Action: Implanted Number Used: 1   Device Identifier:  Device Identifier Type:     KIT LEAD 50CM - [id number]  Inventory Item: KIT LEAD 50CM Serial no.: [id number] Model/Cat no.: [id number]  Implant name: KIT LEAD 50CM - [id number] Laterality: N/A Area: Spine Thoracic  Manufacturer: [employer] Date of Manufacture:    Action: Implanted Number Used: 1   Device Identifier:  Device Identifier TypeLuvenia Pineda - [id number]  Inventory Item: Gabrielle Pineda Serial no.:  Model/Cat no.: [id number]  Implant name: Gabrielle Pineda - [id number] Laterality: N/A Area: Spine Thoracic  Manufacturer: [employer] Date of Manufacture:    Action: Implanted Number Used: 1   Device Identifier:  Device Identifier Type:     KIT IPG ALPHA WAVEWRITER - [id number]  Inventory Item: KIT IPG ALPHA WAVEWRITER Serial no.: [id number] Model/Cat no.: [id number]  Implant name: KIT IPG ALPHA WAVEWRITER - [id number] Laterality: N/A Area: Spine Thoracic  Manufacturer: Wilkie Aye Date of Manufacture:    Action: Implanted Number Used: 1   Device Identifier:  Device Identifier Type:       I performed the case in its entirety with assistance of Cooper Render, [place]   [name], [place]

## 2021-03-18 ENCOUNTER — Encounter: Payer: Self-pay | Admitting: Neurosurgery

## 2021-03-19 NOTE — Anesthesia Postprocedure Evaluation (Signed)
Anesthesia Post Note  Patient: Gabrielle Pineda  Procedure(s) Performed: THORACIC SPINAL CORD STIMULATOR (PERCUTANEOUS) & PULSE GENERATOR PLACEMENT  Patient location during evaluation: PACU Anesthesia Type: General Level of consciousness: awake and alert Pain management: pain level controlled Vital Signs Assessment: post-procedure vital signs reviewed and stable Respiratory status: spontaneous breathing, nonlabored ventilation, respiratory function stable and patient connected to nasal cannula oxygen Cardiovascular status: blood pressure returned to baseline and stable Postop Assessment: no apparent nausea or vomiting Anesthetic complications: no   No notable events documented.   Last Vitals:  Vitals:   03/17/21 1020 03/17/21 1032  BP: 131/71 128/64  Pulse: 82 85  Resp: 15 16  Temp: (!) 36.2 C (!) 36.2 C  SpO2: 93% 94%    Last Pain:  Vitals:   03/18/21 0939  TempSrc:   PainSc: [place] Megumi Treaster

## 2021-03-21 ENCOUNTER — Other Ambulatory Visit: Payer: Self-pay | Admitting: Orthopedic Surgery

## 2021-03-24 ENCOUNTER — Telehealth: Payer: Self-pay

## 2021-03-24 NOTE — Progress Notes (Signed)
Chronic Care Management Pharmacy Assistant   Name: Gabrielle Pineda  MRN: [id number] DOB: March 03, 1956   Reason for Encounter: Medication Review/Medication Coordination Call    Recent office visits:  None ID  Recent consult visits:  03/03/2021 Yvetta Coder (Podiatry) Per note I don't have the appropriate security to download document for review.  Hospital visits:  Medication Reconciliation was completed by comparing discharge summary, patient's EMR and Pharmacy list, and upon discussion with patient.  Admitted to the hospital on 08/018/2022 due to Thoracic Spinal Cord Stimulator & Pulse generator Placement. Discharge date was 03/17/2021. Discharged from [facility]?Medications Started at [facility] Discharge:?? -START taking: butalbital-acetaminophen-caffeine (FIORICET) 50-325-40 MG tablet Take 1 tablet by mouth every 8 (eight) hours as needed for up to 1 day for headache. HYDROcodone-acetaminophen (Norco) 5-325 MG tablet Take 1 tablet by mouth every 8 (eight) hours as needed for up to 5 days for moderate pain or severe pain.  Medication Changes at Hospital Discharge: -Changed None ID  Medications Discontinued at Hospital Discharge: -STOP taking: ibuprofen 200 MG tablet (ADVIL) meloxicam 15 MG tablet (MOBIC)  Medications that remain the same after Hospital Discharge:??  -All other medications will remain the same.    Medications: Outpatient Encounter Medications as of 03/24/2021  Medication Sig   acyclovir ointment (ZOVIRAX) 5 % Apply 1 application topically every 3 (three) hours. (Patient taking differently: Apply 1 application topically every 3 (three) hours as needed (fever blisters).)   ARIPiprazole (ABILIFY) 10 MG tablet Take 1 tablet (10 mg total) by mouth daily.   atorvastatin (LIPITOR) 40 MG tablet Take 1 tablet (40 mg total) by mouth daily.   busPIRone (BUSPAR) 30 MG tablet Take 1 tablet (30 mg total) by mouth 3 (three)  times daily.   carvedilol (COREG) 12.5 MG tablet Take 1 tablet (12.5 mg total) by mouth 2 (two) times daily with a meal.   Eszopiclone 3 MG TABS Take 1 tablet (3 mg total) by mouth at bedtime. Take immediately before bedtime   ezetimibe (ZETIA) 10 MG tablet Take 1 tablet (10 mg total) by mouth daily.   fluticasone (FLONASE) 50 MCG/ACT nasal spray Place 2 sprays into both nostrils daily. (Patient taking differently: Place 2 sprays into both nostrils daily as needed for allergies.)   Fluticasone-Umeclidin-Vilant (TRELEGY ELLIPTA) 100-62.5-25 MCG/INH AEPB Inhale 1 puff into the lungs daily.   furosemide (LASIX) 20 MG tablet TAKE 1 TABLET BY MOUTH ONCE DAILY AS NEEDED FOR FLUID (Patient taking differently: Take 20 mg by mouth daily as needed for edema.)   gabapentin (NEURONTIN) 600 MG tablet Take 1 tablet (600 mg total) by mouth 3 (three) times daily.   Guaifenesin 1200 MG TB12 Take 1,200 mg by mouth daily as needed (congestion).   ipratropium (ATROVENT) 0.02 % nebulizer solution Take 2.5 mLs (0.5 mg total) by nebulization 4 (four) times daily as needed for wheezing or shortness of breath.   leflunomide (ARAVA) 10 MG tablet Take 10 mg by mouth at bedtime.    LINZESS 145 MCG CAPS capsule Take 145 mcg by mouth daily before breakfast.   Multiple Vitamin (MULTIVITAMIN WITH MINERALS) TABS tablet Take 1 tablet by mouth daily.   Nutritional Supplements (ESTROVEN ENERGY PO) Take 1 tablet by mouth daily.    omeprazole (PRILOSEC) 40 MG capsule Take 1 capsule (40 mg total) by mouth 2 (two) times daily.   potassium chloride SA (KLOR-CON) 20 MEQ tablet Take 1 tablet (20 mEq total) by mouth daily with  breakfast.   sertraline (ZOLOFT) 50 MG tablet Take 1 tablet (50 mg total) by mouth daily.   topiramate (TOPAMAX) 25 MG tablet Take 1 tablet (25 mg total) by mouth 2 (two) times daily.   valACYclovir (VALTREX) 1000 MG tablet TAKE 2 TABLETS BY MOUTH TWICE DAILY AS NEEDED FOR 5 DAYS (Patient taking differently: Take 2 g  by mouth See admin instructions. Take 2 g twice daily for 5 days as needed for fever blisters)   venlafaxine XR (EFFEXOR-XR) 75 MG 24 hr capsule TAKE 3 CAPSULES BY MOUTH ONCE DAILY WITH BREAKFAST (Patient taking differently: Take 225 mg by mouth daily with breakfast.)   vitamin B-12 (CYANOCOBALAMIN) 1000 MCG tablet Take 6,000 mcg by mouth daily.   vitamin E 400 UNIT capsule Take 1,600 Units by mouth daily.   XIIDRA 5 % SOLN Place 1 drop into both eyes daily.   No facility-administered encounter medications on file as of 03/24/2021.   Care Gaps: Zoster Vaccines- Shingrix COVID-19 Vaccine INFLUENZA VACCINE  Star Rating Drugs: Atorvastatin 40 mg last filled on 02/25/2021 for a 30-Day supply with [facility]   BP Readings from Last 3 Encounters:  03/17/21 128/64  03/06/21 129/80  01/01/21 (!) 154/91    Lab Results  Component Value Date   HGBA1C 5.0 04/14/2018     Patient obtains medications through Adherence Packaging  30 Days   Last adherence delivery included: Meloxicam 15 mg tablet- Take one tablet by mouth once daily (bedtime) Leflunomide 10 mg tablet- Take one tablet by mouth once daily (bedtime) Ezetimibe 10 mg tablet Take one tablet by mouth daily (breakfast) Trelegy 100-62.5-25 MCG- Inhale 1 puff daily Atorvastatin 40 mg tablet- Take one tablet by mouth daily (breakfast) Carvedilol 12.5 mg tablet - Take on tablet by mouth twice daily (breakfast, bedtime) Potassium 20 mEq - Take one capsule daily (breakfast) Gabapentin 600 mg tablet- Take one tablet by mouth three times daily (breakfast, lunch bedtime) Eszopiclone 3 mg tablet - Take one tablet by mouth nightly (bedtime) Topiramate 25 mg tablet-Take one tablet by mouth twice daily (breakfast, bedtime) Buspirone 30 mg tablet- Take one tablet by mouth three times daily (breakfast, lunch, bedtime) Aripiprazole 10 mg tablet- Take one tablet by mouth daily (breakfast) Sertraline 50 mg tablet- Take one tablet by mouth daily  (bedtime) Venlafaxine ER 75 mg capsule- Take three capsules by mouth daily (breakfast) Omeprazole 40 mg- Take one capsule by mouth two times a day (breakfast, bedtime) Linzess 145 mcg- Take one tablet by mouth daily (before breakfast) (Vials)   Patient declined the following medications last fill: Ipratropium Bromide 0.02 solution- Inhale 2.39ms via nebulizer four times daily (PRN)  Furosemide 20 mg tablet-Take one tablet by mouth once daily (PRN)  Patient is due for next adherence delivery on: 04/02/2021  Called patient and reviewed medications and coordinated delivery.  This delivery to include: Meloxicam 15 mg tablet- Take one tablet by mouth once daily (bedtime) Leflunomide 10 mg tablet- Take one tablet by mouth once daily (bedtime) Ezetimibe 10 mg tablet Take one tablet by mouth daily (breakfast) Trelegy 100-62.5-25 MCG- Inhale 1 puff daily Atorvastatin 40 mg tablet- Take one tablet by mouth daily (breakfast) Carvedilol 12.5 mg tablet - Take on tablet by mouth twice daily (breakfast, bedtime) Potassium 20 mEq - Take one capsule daily (breakfast) Gabapentin 600 mg tablet- Take one tablet by mouth three times daily (breakfast, lunch, bedtime) Eszopiclone 3 mg tablet - Take one tablet by mouth nightly (bedtime) Topiramate 25 mg tablet-Take one tablet by mouth  twice daily (breakfast, bedtime) Buspirone 30 mg tablet- Take one tablet by mouth three times daily (breakfast, lunch, bedtime) Aripiprazole 10 mg tablet- Take one tablet by mouth daily (breakfast) Sertraline 50 mg tablet- Take one tablet by mouth daily (bedtime) Venlafaxine ER 75 mg capsule- Take three capsules by mouth daily (breakfast) Omeprazole 40 mg- Take one capsule by mouth two times a day (breakfast, bedtime) Linzess 145 mcg- Take one tablet by mouth daily (before breakfast) (Vials)   Patient declined the following medications: Ipratropium Bromide 0.02 solution- Inhale 2.75ms via nebulizer four times daily (PRN)   Furosemide 20 mg tablet-Take one tablet by mouth once daily (PRN)  Patient needs refills for Gabapentin 600 mg three times daily. (According to form request [place]  Confirmed delivery date of 04/02/2021 2nd Route, advised patient that pharmacy will contact them the morning of delivery.  Patient has an upcoming appointment with AJunius Argyle CPP on 03/28/2021 @ 0815- Patient confirmed appointment on Friday  No AWV scheduled for 2022  TLynann Bologna CPA/CMA Clinical Pharmacist Assistant Phone: [phone]

## 2021-03-27 ENCOUNTER — Other Ambulatory Visit: Payer: Self-pay | Admitting: Student in an Organized Health Care Education/Training Program

## 2021-03-27 ENCOUNTER — Telehealth: Payer: Self-pay

## 2021-03-27 DIAGNOSIS — G629 Polyneuropathy, unspecified: Secondary | ICD-10-CM

## 2021-03-27 NOTE — Progress Notes (Signed)
    Chronic Care Management Pharmacy Assistant   Name: Gabrielle Pineda  MRN: [id number] DOB: 01-03-1956  Patient called to be reminded of her appointment with Junius Argyle, CPP on 03/28/2021 @ 0815 via telephone  Patient aware of appointment date, time, and type of appointment (either telephone or in person). Patient aware to have/bring all medications, supplements, blood pressure and/or blood sugar logs to visit.  Questions: Are there any concerns you would like to discuss during your office visit? No Are you having any problems obtaining your medications? No  Star Rating Drug: Atorvastatin 40 mg last filled on 02/25/2021 for a 30-Day supply with [facility]    Any gaps in medications fill history? NO  Care Gaps: Zoster Vaccines- Shingrix COVID-19 Vaccine INFLUENZA VACCINE

## 2021-03-28 ENCOUNTER — Ambulatory Visit (INDEPENDENT_AMBULATORY_CARE_PROVIDER_SITE_OTHER): Payer: PPO

## 2021-03-28 DIAGNOSIS — G894 Chronic pain syndrome: Secondary | ICD-10-CM

## 2021-03-28 DIAGNOSIS — F331 Major depressive disorder, recurrent, moderate: Secondary | ICD-10-CM | POA: Diagnosis not present

## 2021-03-28 NOTE — Patient Instructions (Signed)
Visit Information It was great speaking with you today!  Please let me know if you have any questions about our visit.   Goals Addressed             This Visit's Progress    Track and Manage My Triggers-COPD   On track    Timeframe:  Long-Range Goal Priority:  High Start Date: 11/25/2020                            Expected End Date:  05/27/2022                     Follow Up Date 06/27/2021   - avoid second hand smoke - eliminate smoking in my home - identify and remove indoor air pollutants - limit outdoor activity during cold weather    Why is this important?   Triggers are activities or things, like tobacco smoke or cold weather, that make your COPD (chronic obstructive pulmonary disease) flare-up.  Knowing these triggers helps you plan how to stay away from them.  When you cannot remove them, you can learn how to manage them.     Notes:         Patient Care Plan: General Pharmacy (Adult)     Problem Identified: Hypertension, Hyperlipidemia, COPD, Depression, Tobacco use and Chronic Pain, and Rheumatoid Arthritis   Priority: High     Long-Range Goal: Patient-Specific Goal   Start Date: 11/25/2020  Expected End Date: 03/28/2022  This Visit's Progress: On track  Recent Progress: On track  Priority: High  Note:   Current Barriers:  Unable to independently afford treatment regimen Unable to achieve control of COPD  Suboptimal therapeutic regimen for depression  Pharmacist Clinical Goal(s):  Patient will verbalize ability to afford treatment regimen achieve control of COPD as evidenced by stable breathing, avoiding cigarette use, and absence of exacerbations adhere to plan to optimize therapeutic regimen for depression as evidenced by report of adherence to recommended medication management changes through collaboration with PharmD and provider.   Interventions: 1:1 collaboration with Chrismon, Simona Huh, PA-C regarding development and update of comprehensive plan of  care as evidenced by provider attestation and co-signature Inter-disciplinary care team collaboration (see longitudinal plan of care) Comprehensive medication review performed; medication list updated in electronic medical record  Hypertension (BP goal <140/90) -Controlled -Current treatment: Carvedilol 12.5 mg twice daily  Furosemide 20 mg daily as needed  -Medications previously tried: NA  -Current home readings: NA -Denies hypotensive/hypertensive symptoms -Patient does report increased fatigue and lethargy since starting carvedilol, although patient does report poor sleep quality -Educated on Daily salt intake goal < 2300 mg; Exercise goal of 150 minutes per week; -Counseled to monitor BP at home weekly, document, and provide log at future appointments -Recommended to continue current medication  Hyperlipidemia: (LDL goal < 70) -Controlled -Current treatment: Atorvastatin 40 mg daily  Ezetimibe 10 mg daily  -Medications previously tried: NA  -Educated on Benefits of statin for ASCVD risk reduction; -Recommended to continue current medication  COPD (Goal: control symptoms and prevent exacerbations) -Controlled -Current treatment  Ipratropium 0.02% nebulizer 2.5 mL every 4 hours Trelegy 1 puff daily  -Medications previously tried: NA  -Gold Grade: Gold 2 (FEV1 50-79%) -Current COPD Classification:  B (high sx, <2 exacerbations/yr) -MMRC/CAT score: NA -Pulmonary function testing: 71% (2017) -Exacerbations requiring treatment in last 6 months: yes -Breathing much improved since quitting smoking and daily use of Trelegy. Patient rarely  needs to use nebulizer solution.  -Recommended to continue current medication Assessed patient finances. Trelegy patient assistance was denied  Depression/Anxiety/ Insomnia (Goal: Maintain stable mood) -Uncontrolled -Current treatment: Aripiprazole 10 mg daily  Buspirone 30 mg three times daily  Eszopiclone 3 mg at bedtime  Sertraline 50  mg daily  Venlafaxine XR 75 mg 3 capsules daily -Medications previously tried/failed: NA -PHQ9: 1 -GAD7: 9 -Able to fall asleep quickly, but often wakes up multiple times throughout the night -Connected with NA for mental health support -Educated on Benefits of medication for symptom control -Anticipate taper of sertraline next visit and switching to mirtazpaine for better sleep control.   Tobacco use (Goal quit smoking) -Controlled -Previous quit attempts: Nicotine Patches (completed course)  -Current treatment  None -Patient smokes After 30 minutes of waking -Patient triggers include: finishing a meal  -Patient continues to have triggers to smoke, but so far has been motivated to stay away from it due to her lung condition -Counseled on importance of continued cessation of tobacco products, tools for managing cravings.   Patient Goals/Self-Care Activities Patient will:  - check blood pressure weekly, document, and provide at future appointments Utilize maintenance inhaler daily  Follow Up Plan: Telephone follow up appointment with care management team member scheduled for:  06/02/2021 at 8:15 AM      Patient agreed to services and verbal consent obtained.   Patient verbalizes understanding of instructions provided today and agrees to view in [facility].   Junius Argyle, PharmD, [facility] [place], [place] [phone]

## 2021-03-28 NOTE — Progress Notes (Signed)
Chronic Care Management Pharmacy Note  03/28/2021 Name:  Gabrielle Pineda MRN:  [id number] DOB:  [birth year]  Subjective:  Summary: Patient presents for ccm follow-up. She has been in significant pain and has been largely immobile due to it. She is doing better now that her orthopedic prescribed her pain medication.  Recommendations/Changes made from today's visit: Will defer further medication changes at this time due to patient pain and upcoming surgery. Anticipate tapering sertraline and switching to mirtazapine next visit  Plan: CPP follow-up in one month   Gabrielle Pineda is an 65 y.o. year old female who is a primary patient of Chrismon, Vickki Muff, PA-C.  The CCM team was consulted for assistance with disease management and care coordination needs.    Engaged with patient by telephone for follow up visit in response to provider referral for pharmacy case management and/or care coordination services.   Consent to Services:  The patient was given information about Chronic Care Management services, agreed to services, and gave verbal consent prior to initiation of services.  Please see initial visit note for detailed documentation.   Patient Care Team: Chrismon, Vickki Muff, PA-C as PCP - General (Family Medicine) Minna Merritts, MD as PCP - Cardiology (Cardiology) Manya Silvas, MD (Inactive) (Gastroenterology) Germaine Pomfret, [facility] (Pharmacist)  Recent office visits: 12/10/20: Patient presented to Vernie Murders, PA-C for follow-up.  11/08/20: Patient presented to Fenton Malling, PA-C for hospital follow-up. Diltiazem, hydoxyzine, trazodone stopped. Eszopiclone 3 mg started, prednisone 12 day course started.   Recent consult visits: 02/06/21: Patient presented to Dr. Jefm Bryant (Rheumatology) for follow-up. No medication changes noted.  11/20/20: Patient presented to Laurann Montana, NP (Cardiology) for follow-up.   Hospital visits: Admitted to the hospital on  08/018/2022 due to Thoracic Spinal Cord Stimulator & Pulse generator Placement. Discharge date was 03/17/2021. Discharged from [facility] 3/16-3/20: Patient was hospitalized at [facility] for acute respiratory failure and COPD exacerbation. Continue prednisone course for 2 more days. Stopped diltiazem, Started on Carvedilol 12.5 mg twice daily due to tachycardia. QTc 607 ms, holding aripiprazole, trazodone.   Objective:  Lab Results  Component Value Date   CREATININE 0.80 03/17/2021   BUN 10 03/17/2021   GFRNONAA >60 03/06/2021   GFRAA 72 05/06/2020   NA 142 03/17/2021   K 3.7 03/17/2021   CALCIUM 9.0 03/06/2021   CO2 23 03/06/2021   GLUCOSE 116 (H) 03/17/2021    Lab Results  Component Value Date/Time   HGBA1C 5.0 04/14/2018 11:24 AM   HGBA1C 5.2 04/16/2017 11:01 AM    Last diabetic Eye exam: No results found for: HMDIABEYEEXA  Last diabetic Foot exam: No results found for: HMDIABFOOTEX   Lab Results  Component Value Date   CHOL 172 04/19/2020   HDL 56 04/19/2020   LDLCALC 98 04/19/2020   TRIG 102 04/19/2020   CHOLHDL 3.1 04/19/2020    Hepatic Function Latest Ref Rng & Units 05/06/2020 04/19/2020 08/09/2019  Total Protein 6.0 - 8.5 g/dL - 6.4 6.3(L)  Albumin 3.8 - 4.8 g/dL 4.2 4.3 4.0  AST 0 - 40 IU/L - 28 24  ALT 0 - 32 IU/L - 25 19  Alk Phosphatase 48 - 121 IU/L - 115 101  Total Bilirubin 0.0 - 1.2 mg/dL - <0.2 0.3  Bilirubin, Direct 0.00 - 0.40 mg/dL - - -    Lab Results  Component Value Date/Time   TSH 1.310 04/17/2019 10:37 AM   TSH 1.700 04/14/2018 11:24 AM  CBC Latest Ref Rng & Units 03/17/2021 03/06/2021 04/19/2020  WBC 4.0 - 10.5 K/uL - 5.3 6.7  Hemoglobin 12.0 - 15.0 g/dL 11.2(L) 11.1(L) 12.4  Hematocrit 36.0 - 46.0 % 33.0(L) 35.2(L) 36.8  Platelets 150 - 400 K/uL - 266 212    No results found for: VD25OH  Clinical ASCVD: No  The 10-year ASCVD risk score Mikey Bussing DC Jr., et al., 2013) is: 11.4%   Values used to calculate the  score:     Age: 65 years     Sex: Female     Is Non-Hispanic African American: No     Diabetic: No     Tobacco smoker: Yes     Systolic Blood Pressure: 650 mmHg     Is BP treated: Yes     HDL Cholesterol: 56 mg/dL     Total Cholesterol: 172 mg/dL    Depression screen [facility] 2/9 01/01/2021 12/25/2020 11/08/2020  Decreased Interest 0 0 0  Down, Depressed, Hopeless 0 0 0  PHQ - 2 Score 0 0 0  Altered sleeping - - 0  Tired, decreased energy - - 1  Change in appetite - - 0  Feeling bad or failure about yourself  - - 0  Trouble concentrating - - 0  Moving slowly or fidgety/restless - - 0  Suicidal thoughts - - 0  PHQ-9 Score - - 1  Difficult doing work/chores - - Not difficult at all  Some recent data might be hidden    Social History   Tobacco Use  Smoking Status Former   Packs/day: 1.00   Years: 40.00   Pack years: 40.00   Types: Cigarettes   Quit date: 10/30/2020   Years since quitting: 0.4  Smokeless Tobacco Never   BP Readings from Last 3 Encounters:  03/17/21 128/64  03/06/21 129/80  01/01/21 (!) 154/91   Pulse Readings from Last 3 Encounters:  03/17/21 85  03/06/21 66  01/01/21 70   Wt Readings from Last 3 Encounters:  03/17/21 150 lb (68 kg)  03/06/21 155 lb (70.3 kg)  01/01/21 153 lb (69.4 kg)   BMI Readings from Last 3 Encounters:  03/17/21 22.15 kg/m  03/06/21 22.89 kg/m  01/01/21 22.59 kg/m    Assessment/Interventions: Review of patient past medical history, allergies, medications, health status, including review of consultants reports, laboratory and other test data, was performed as part of comprehensive evaluation and provision of chronic care management services.   SDOH:  (Social Determinants of Health) assessments and interventions performed: Yes SDOH Interventions    Flowsheet Row Most Recent Value  SDOH Interventions   Financial Strain Interventions Intervention Not Indicated        SDOH Screenings   Alcohol Screen: Low Risk    Last  Alcohol Screening Score (AUDIT): 0  Depression (PHQ2-9): Low Risk    PHQ-2 Score: 0  Financial Resource Strain: Low Risk    Difficulty of Paying Living Expenses: Not hard at all  Food Insecurity: Not on file  Housing: Not on file  Physical Activity: Not on file  Social Connections: Not on file  Stress: Not on file  Tobacco Use: Medium Risk   Smoking Tobacco Use: Former   Smokeless Tobacco Use: Never  Transportation Needs: No Transportation Needs   Lack of Transportation (Medical): No   Lack of Transportation (Non-Medical): No    CCM Care Plan  Allergies  Allergen Reactions   Plaquenil [Hydroxychloroquine] Rash    Medications Reviewed Today     Reviewed by Daron Offer  A, RPH (Pharmacist) on 03/26/21 at [employer]  Med List Status: <None>   Medication Order Taking? Sig Documenting Provider Last Dose Status Informant  acyclovir ointment (ZOVIRAX) 5 % [id number]  Apply 1 application topically every 3 (three) hours.  Patient taking differently: Apply 1 application topically every 3 (three) hours as needed (fever blisters).   Fenton Malling M, PA-C  Active   ARIPiprazole (ABILIFY) 10 MG tablet [id number] Yes Take 1 tablet (10 mg total) by mouth daily. Virginia Crews, MD Taking Active Self  atorvastatin (LIPITOR) 40 MG tablet [id number] Yes Take 1 tablet (40 mg total) by mouth daily. [name], PA-C Taking Active Self  busPIRone (BUSPAR) 30 MG tablet [id number] Yes Take 1 tablet (30 mg total) by mouth 3 (three) times daily. [place] Crews, MD Taking Active Self  carvedilol (COREG) 12.5 MG tablet [id number] Yes Take 1 tablet (12.5 mg total) by mouth 2 (two) times daily with a meal. Loel Dubonnet, NP Taking Active Self  Eszopiclone 3 MG TABS [id number] Yes Take 1 tablet (3 mg total) by mouth at bedtime. Take immediately before bedtime Birdie Sons, MD Taking Active Self  ezetimibe (ZETIA) 10 MG tablet [id number] Yes Take 1 tablet (10 mg total) by mouth daily.  [name], PA-C Taking Active Self  fluticasone (FLONASE) 50 MCG/ACT nasal spray [id number]  Place 2 sprays into both nostrils daily.  Patient taking differently: Place 2 sprays into both nostrils daily as needed for allergies.   Fenton Malling M, PA-C  Active   Fluticasone-Umeclidin-Vilant (TRELEGY ELLIPTA) 100-62.5-25 MCG/INH AEPB [id number] Yes Inhale 1 puff into the lungs daily. [name], PA-C Taking Active Self  furosemide (LASIX) 20 MG tablet [id number]  TAKE 1 TABLET BY MOUTH ONCE DAILY AS NEEDED FOR FLUID  Patient taking differently: Take 20 mg by mouth daily as needed for edema.   Fenton Malling M, PA-C  Active   gabapentin (NEURONTIN) 600 MG tablet [id number] Yes Take 1 tablet (600 mg total) by mouth 3 (three) times daily. [name], MD Taking Active Self  Guaifenesin 1200 MG TB12 [id number]  Take 1,200 mg by mouth daily as needed (congestion). [provider]  Active Self  ipratropium (ATROVENT) 0.02 % nebulizer solution [id number]  Take 2.5 mLs (0.5 mg total) by nebulization 4 (four) times daily as needed for wheezing or shortness of breath. [name], PA-C  Active Self  leflunomide (ARAVA) 10 MG tablet [id number] Yes Take 10 mg by mouth at bedtime.  [provider] Taking Active Self  LINZESS 145 MCG CAPS capsule [id number] Yes Take 145 mcg by mouth daily before breakfast. [provider] Taking Active Self  Multiple Vitamin (MULTIVITAMIN WITH MINERALS) TABS tablet [id number]  Take 1 tablet by mouth daily. [provider]  Active Self  Nutritional Supplements (ESTROVEN ENERGY PO) [id number]  Take 1 tablet by mouth daily.  [provider]  Active Self  omeprazole (PRILOSEC) 40 MG capsule [id number] Yes Take 1 capsule (40 mg total) by mouth 2 (two) times daily. [name], PA-C Taking Active Self  potassium chloride SA (KLOR-CON) 20 MEQ tablet [id number] Yes Take 1 tablet (20 mEq total) by mouth daily  with breakfast. Chrismon, Vickki Muff, PA-C Taking Active Self  sertraline (ZOLOFT) 50 MG tablet [id number] Yes Take 1 tablet (50 mg total) by mouth daily. Chrismon, Vickki Muff, PA-C Taking Active Self           Med Note Lucia Gaskins, CHERIE L   Thu Mar 06, 2021  8:38 AM)    topiramate (TOPAMAX) 25 MG tablet [id number] Yes Take 1 tablet (25 mg total) by mouth 2 (two) times daily. [name], MD Taking Active Self  valACYclovir (VALTREX) 1000 MG tablet [id number]  TAKE 2 TABLETS BY MOUTH TWICE DAILY AS NEEDED FOR 5 DAYS  Patient taking differently: Take 2 g by mouth See admin instructions. Take 2 g twice daily for 5 days as needed for fever blisters   [name], [place]  Active   venlafaxine XR (EFFEXOR-XR) 75 MG 24 hr capsule [id number] Yes TAKE 3 CAPSULES BY MOUTH ONCE DAILY WITH BREAKFAST  Patient taking differently: Take 225 mg by mouth daily with breakfast.   [name], PA-C Taking Active   vitamin B-12 (CYANOCOBALAMIN) 1000 MCG tablet [id number]  Take 6,000 mcg by mouth daily. [provider]  Active Self  vitamin E 400 UNIT capsule [id number]  Take 1,600 Units by mouth daily. [provider]  Active Self  XIIDRA 5 % SOLN [id number]  Place 1 drop into both eyes daily. [provider]  Active Self  Med List Note Janett Billow, RN 09/07/17 1420): UDS 09/07/17            Patient Active Problem List   Diagnosis Date Noted   Chronic radicular lumbar pain 10/03/2020   Postlaminectomy syndrome, lumbar region 06/03/2020   Acute non-recurrent pansinusitis 03/12/2020   Moderate episode of recurrent major depressive disorder ([place]) 12/04/2019   Spondylosis of cervical region without myelopathy or radiculopathy 09/21/2019   DDD (degenerative disc disease), cervical 09/21/2019   Cervicalgia 09/21/2019   Cervical fusion syndrome 09/21/2019   Chronic pain syndrome 09/21/2019   Spinal stenosis, lumbar region, with neurogenic claudication 08/15/2019    History of adenomatous polyp of colon 11/04/2017   Cervical radiculopathy 08/02/2017   Neuropathy 08/02/2017   Essential hypertension 07/28/2017   Closed compression fracture of L5 lumbar vertebra 07/07/2017   Sacral insufficiency fracture with routine healing 07/07/2017   COPD (chronic obstructive pulmonary disease) ([place]) 04/10/2015   Family history of malignant neoplasm of pancreas 04/03/2015   Hypercholesteremia 04/03/2015   Disorder of iron metabolism 04/03/2015   Weight loss 04/03/2015   Delayed onset of urination 04/03/2015   Acid reflux 10/11/2014   Closed fracture of distal phalanx of thumb 08/15/2014   Arthritis, degenerative 01/30/2014   Arthritis or polyarthritis, rheumatoid ([place]) 01/30/2014   Shortness of breath 12/22/2013   SMOKER 09/22/2010   Paroxysmal supraventricular tachycardia ([place]) 09/22/2010   CHEST PAIN UNSPECIFIED 09/22/2010   ELECTROCARDIOGRAM, ABNORMAL 09/22/2010   B-complex deficiency 09/09/2007   CN (constipation) 06/26/2007   Clinical depression 06/26/2007   Cold sore 06/26/2007   H/O alcohol abuse 06/26/2007   Cannot sleep 06/26/2007   Localized osteoarthrosis, hand 06/26/2007   Menopausal symptom 06/26/2007    Immunization History  Administered Date(s) Administered   Influenza Split 09/07/2012   Influenza,inj,Quad PF,6+ Mos 11/18/2015, 07/23/2016, 07/02/2017, 04/14/2018, 04/17/2019, 04/19/2020   Influenza-Unspecified 09/24/2014   PFIZER(Purple Top)SARS-COV-2 Vaccination 11/21/2019, 12/12/2019   Pneumococcal Polysaccharide-23 02/12/2011, 09/24/2014, 04/19/2020   Tdap 02/12/2011, 11/15/2015    Conditions to be addressed/monitored:  Hypertension, Hyperlipidemia, COPD, Depression, Tobacco use, and Insomnia, Chronic Pain, and Rheumatoid Arthritis  Care Plan : General Pharmacy (Adult)  Updates made by Germaine Pomfret, RPH since 03/28/2021 12:00 AM     Problem: Hypertension, Hyperlipidemia, COPD, Depression, Tobacco use and Chronic Pain, and  Rheumatoid Arthritis   Priority: High     Long-Range Goal: Patient-Specific Goal   Start Date: 11/25/2020  Expected End Date: 03/28/2022  This Visit's Progress: On track  Recent Progress: On track  Priority: High  Note:   Current Barriers:  Unable to independently afford treatment regimen Unable to achieve control of COPD  Suboptimal therapeutic regimen for depression  Pharmacist Clinical Goal(s):  Patient will verbalize ability to afford treatment regimen achieve control of COPD as evidenced by stable breathing, avoiding cigarette use, and absence of exacerbations adhere to plan to optimize therapeutic regimen for depression as evidenced by report of adherence to recommended medication management changes through collaboration with PharmD and provider.   Interventions: 1:1 collaboration with Chrismon, Simona Huh, PA-C regarding development and update of comprehensive plan of care as evidenced by provider attestation and co-signature Inter-disciplinary care team collaboration (see longitudinal plan of care) Comprehensive medication review performed; medication list updated in electronic medical record  Hypertension (BP goal <140/90) -Controlled -Current treatment: Carvedilol 12.5 mg twice daily  Furosemide 20 mg daily as needed  -Medications previously tried: NA  -Current home readings: NA -Denies hypotensive/hypertensive symptoms -Patient does report increased fatigue and lethargy since starting carvedilol, although patient does report poor sleep quality -Educated on Daily salt intake goal < 2300 mg; Exercise goal of 150 minutes per week; -Counseled to monitor BP at home weekly, document, and provide log at future appointments -Recommended to continue current medication  Hyperlipidemia: (LDL goal < 70) -Controlled -Current treatment: Atorvastatin 40 mg daily  Ezetimibe 10 mg daily  -Medications previously tried: NA  -Educated on Benefits of statin for ASCVD risk  reduction; -Recommended to continue current medication  COPD (Goal: control symptoms and prevent exacerbations) -Controlled -Current treatment  Ipratropium 0.02% nebulizer 2.5 mL every 4 hours Trelegy 1 puff daily  -Medications previously tried: NA  -Gold Grade: Gold 2 (FEV1 50-79%) -Current COPD Classification:  B (high sx, <2 exacerbations/yr) -MMRC/CAT score: NA -Pulmonary function testing: 71% (2017) -Exacerbations requiring treatment in last 6 months: yes -Breathing much improved since quitting smoking and daily use of Trelegy. Patient rarely needs to use nebulizer solution.  -Recommended to continue current medication Assessed patient finances. Trelegy patient assistance was denied  Depression/Anxiety/ Insomnia (Goal: Maintain stable mood) -Uncontrolled -Current treatment: Aripiprazole 10 mg daily  Buspirone 30 mg three times daily  Eszopiclone 3 mg at bedtime  Sertraline 50 mg daily  Venlafaxine XR 75 mg 3 capsules daily -Medications previously tried/failed: NA -PHQ9: 1 -GAD7: 9 -Able to fall asleep quickly, but often wakes up multiple times throughout the night -Connected with NA for mental health support -Educated on Benefits of medication for symptom control -Anticipate taper of sertraline next visit and switching to mirtazpaine for better sleep control.   Tobacco use (Goal quit smoking) -Controlled -Previous quit attempts: Nicotine Patches (completed course)  -Current treatment  None -Patient smokes After 30 minutes of waking -Patient triggers include: finishing a meal  -Patient continues to have triggers to smoke, but so far has been motivated to stay away from it due to her lung condition -Counseled on importance of continued cessation of tobacco products, tools for managing cravings.   Patient Goals/Self-Care Activities Patient will:  - check blood pressure weekly, document, and provide at future appointments Utilize maintenance inhaler daily  Follow Up  Plan: Telephone follow up appointment with care management team member scheduled for:  06/02/2021 at 8:15 AM       Medication Assistance:  Application for Trelegy denied  Patient's preferred pharmacy is:  [facility] - [place], [place] - [address]  [address] [place] [address] Phone: [phone] Fax: [phone]  [address] [place], [place] [address] Phone: [phone] Fax: [phone]  Patient decided to: Utilize [facility] for medication synchronization, packaging and delivery   Care Plan and Follow Up Patient Decision:  Patient agrees to Care Plan and Follow-up.  Plan: Telephone follow up appointment with care management team member scheduled for:  06/02/2021 at 8:15 AM  Junius Argyle, PharmD, [place] [phone]

## 2021-03-31 DIAGNOSIS — S92322D Displaced fracture of second metatarsal bone, left foot, subsequent encounter for fracture with routine healing: Secondary | ICD-10-CM | POA: Diagnosis not present

## 2021-03-31 DIAGNOSIS — S92332D Displaced fracture of third metatarsal bone, left foot, subsequent encounter for fracture with routine healing: Secondary | ICD-10-CM | POA: Diagnosis not present

## 2021-04-01 ENCOUNTER — Other Ambulatory Visit: Payer: Self-pay | Admitting: Student in an Organized Health Care Education/Training Program

## 2021-04-01 ENCOUNTER — Telehealth: Payer: Self-pay

## 2021-04-01 DIAGNOSIS — G629 Polyneuropathy, unspecified: Secondary | ICD-10-CM

## 2021-04-01 MED ORDER — GABAPENTIN 600 MG PO TABS: 600.0000 mg | ORAL_TABLET | Freq: Three times a day (TID) | ORAL | 2 refills | Status: DC

## 2021-04-01 NOTE — Progress Notes (Signed)
    Chronic Care Management Pharmacy Assistant   Name: SELBY HUBBS  MRN: [id number] DOB: [birth year]  The pharmacy reached out stating that they still have not received the refill for patients Gabapentin from Dr. Holley Raring office. I contacted the office to see when they plan to authorize the prescription for the patient as her delivery is due tomorrow and if we don't get the authorization today she will not get the medication.Spoke with the nurse and Dr. Elwyn Lade office and she stated that she would have to speak with the provider and have him send them in. I sent the message over to [facility] letting them know.   If the prescription is not received today the patient will not get this medication in her packs that go out tomorrow.  I contacted the patient to see if she could also give them a call and let them know so there will be no delay in patient care. The patient stated she will give them a call as well.   Lynann Bologna, CPA/CMA Clinical Pharmacist Assistant Phone: [phone]

## 2021-04-04 ENCOUNTER — Encounter: Payer: Self-pay | Admitting: Orthopedic Surgery

## 2021-04-04 ENCOUNTER — Encounter
Admission: RE | Admit: 2021-04-04 | Discharge: 2021-04-04 | Disposition: A | Discharge status: Home / Self Care | Payer: PPO | Source: Ambulatory Visit | Attending: Orthopedic Surgery | Admitting: Orthopedic Surgery

## 2021-04-04 ENCOUNTER — Other Ambulatory Visit: Payer: Self-pay

## 2021-04-04 DIAGNOSIS — Z01818 Encounter for other preprocedural examination: Secondary | ICD-10-CM | POA: Insufficient documentation

## 2021-04-04 DIAGNOSIS — Z950 Presence of cardiac pacemaker: Secondary | ICD-10-CM | POA: Insufficient documentation

## 2021-04-04 LAB — URINALYSIS, ROUTINE W REFLEX MICROSCOPIC
Bilirubin Urine: NEGATIVE
Glucose, UA: NEGATIVE mg/dL
Hgb urine dipstick: NEGATIVE
Ketones, ur: NEGATIVE mg/dL
Leukocytes,Ua: NEGATIVE
Nitrite: NEGATIVE
Protein, ur: NEGATIVE mg/dL
Specific Gravity, Urine: 1.002 — ABNORMAL LOW (ref 1.005–1.030)
pH: 6 (ref 5.0–8.0)

## 2021-04-04 LAB — COMPREHENSIVE METABOLIC PANEL
ALT: 18 U/L (ref 0–44)
AST: 19 U/L (ref 15–41)
Albumin: 3.4 g/dL — ABNORMAL LOW (ref 3.5–5.0)
Alkaline Phosphatase: 186 U/L — ABNORMAL HIGH (ref 38–126)
Anion gap: 9 (ref 5–15)
BUN: 6 mg/dL — ABNORMAL LOW (ref 8–23)
CO2: 20 mmol/L — ABNORMAL LOW (ref 22–32)
Calcium: 9 mg/dL (ref 8.9–10.3)
Chloride: 109 mmol/L (ref 98–111)
Creatinine, Ser: 1.1 mg/dL — ABNORMAL HIGH (ref 0.44–1.00)
GFR, Estimated: 56 mL/min — ABNORMAL LOW (ref >60)
Glucose, Bld: 86 mg/dL (ref 70–99)
Potassium: 3.5 mmol/L (ref 3.5–5.1)
Sodium: 138 mmol/L (ref 135–145)
Total Bilirubin: 0.3 mg/dL (ref 0.3–1.2)
Total Protein: 7 g/dL (ref 6.5–8.1)

## 2021-04-04 LAB — CBC WITH DIFFERENTIAL/PLATELET
Abs Immature Granulocytes: 0.02 10*3/uL (ref 0.00–0.07)
Basophils Absolute: 0 10*3/uL (ref 0.0–0.1)
Basophils Relative: 1 %
Eosinophils Absolute: 0.2 10*3/uL (ref 0.0–0.5)
Eosinophils Relative: 3 %
HCT: 33.1 % — ABNORMAL LOW (ref 36.0–46.0)
Hemoglobin: 10.4 g/dL — ABNORMAL LOW (ref 12.0–15.0)
Immature Granulocytes: 0 %
Lymphocytes Relative: 26 %
Lymphs Abs: 1.7 10*3/uL (ref 0.7–4.0)
MCH: 31.3 pg (ref 26.0–34.0)
MCHC: 31.4 g/dL (ref 30.0–36.0)
MCV: 99.7 fL (ref 80.0–100.0)
Monocytes Absolute: 0.7 10*3/uL (ref 0.1–1.0)
Monocytes Relative: 10 %
Neutro Abs: 4 10*3/uL (ref 1.7–7.7)
Neutrophils Relative %: 60 %
Platelets: 375 10*3/uL (ref 150–400)
RBC: 3.32 MIL/uL — ABNORMAL LOW (ref 3.87–5.11)
RDW: 12.7 % (ref 11.5–15.5)
WBC: 6.5 10*3/uL (ref 4.0–10.5)
nRBC: 0 % (ref 0.0–0.2)

## 2021-04-04 LAB — TYPE AND SCREEN
ABO/RH(D): O POS
Antibody Screen: NEGATIVE

## 2021-04-04 NOTE — Patient Instructions (Signed)
Your procedure is scheduled on: Report to the Registration Desk on the 1st floor of the [facility]. To find out your arrival time, please call [phone] between 1PM - 3PM on:  REMEMBER: Instructions that are not followed completely may result in serious medical risk, up to and including death; or upon the discretion of your surgeon and anesthesiologist your surgery may need to be rescheduled.  Do not eat food after midnight the night before surgery.  No gum chewing, lozengers or hard candies.  You may however, drink CLEAR liquids up to 2 hours before you are scheduled to arrive for your surgery. Do not drink anything within 2 hours of your scheduled arrival time.  Clear liquids include: - water  - apple juice without pulp - gatorade (not RED, PURPLE, OR BLUE) - black coffee or tea (Do NOT add milk or creamers to the coffee or tea) Do NOT drink anything that is not on this list.  Type 1 and Type 2 diabetics should only drink water.  In addition, your doctor has ordered for you to drink the provided  Ensure Pre-Surgery Clear Carbohydrate Drink  Gatorade G2 Drinking this carbohydrate drink up to two hours before surgery helps to reduce insulin resistance and improve patient outcomes. Please complete drinking 2 hours prior to scheduled arrival time.  TAKE THESE MEDICATIONS THE MORNING OF SURGERY WITH A SIP OF WATER:  (take one the night before and one on the morning of surgery - helps to prevent nausea after surgery.)  Use inhalers on the day of surgery and bring to the hospital.  Stop Metformin and Janumet 2 days prior to surgery.  Take 1/2 of usual insulin dose the night before surgery and none on the morning of surgery.  Follow recommendations from Cardiologist, Pulmonologist or PCP regarding stopping Aspirin, Coumadin, Plavix, Eliquis, Pradaxa, or Pletal.  One week prior to surgery: Stop Anti-inflammatories (NSAIDS) such as Advil, Aleve, Ibuprofen, Motrin, Naproxen,  Naprosyn and Aspirin based products such as Excedrin, Goodys Powder, BC Powder. Stop ANY OVER THE COUNTER supplements until after surgery. You may however, continue to take Tylenol if needed for pain up until the day of surgery.  No Alcohol for 24 hours before or after surgery.  No Smoking including e-cigarettes for 24 hours prior to surgery.  No chewable tobacco products for at least 6 hours prior to surgery.  No nicotine patches on the day of surgery.  Do not use any "recreational" drugs for at least a week prior to your surgery.  Please be advised that the combination of cocaine and anesthesia may have negative outcomes, up to and including death. If you test positive for cocaine, your surgery will be cancelled.  On the morning of surgery brush your teeth with toothpaste and water, you may rinse your mouth with mouthwash if you wish. Do not swallow any toothpaste or mouthwash.  Do not wear jewelry, make-up, hairpins, clips or nail polish.  Do not wear lotions, powders, or perfumes.   Do not shave body from the neck down 48 hours prior to surgery just in case you cut yourself which could leave a site for infection.  Also, freshly shaved skin may become irritated if using the CHG soap.  Contact lenses, hearing aids and dentures may not be worn into surgery.  Do not bring valuables to the hospital. [facility] is not responsible for any missing/lost belongings or valuables.   Use CHG Soap or wipes as directed on instruction sheet.  Total Shoulder Arthroplasty:  use Benzolyl Peroxide 5% Gel as directed on instruction sheet.  Fleets enema or Magnesium Citrate as directed.  Bring your C-PAP to the hospital with you in case you may have to spend the night.   Notify your doctor if there is any change in your medical condition (cold, fever, infection).  Wear comfortable clothing (specific to your surgery type) to the hospital.  After surgery, you can help prevent lung complications  by doing breathing exercises.  Take deep breaths and cough every 1-2 hours. Your doctor may order a device called an Incentive Spirometer to help you take deep breaths. When coughing or sneezing, hold a pillow firmly against your incision with both hands. This is called "splinting." Doing this helps protect your incision. It also decreases belly discomfort.  If you are being admitted to the hospital overnight, leave your suitcase in the car. After surgery it may be brought to your room.  If you are being discharged the day of surgery, you will not be allowed to drive home. You will need a responsible adult (18 years or older) to drive you home and stay with you that night.   If you are taking public transportation, you will need to have a responsible adult (18 years or older) with you. Please confirm with your physician that it is acceptable to use public transportation.   Please call the [facility]. at [phone] if you have any questions about these instructions.  Surgery Visitation Policy:  Patients undergoing a surgery or procedure may have one family member or support person with them as long as that person is not COVID-19 positive or experiencing its symptoms.  That person may remain in the waiting area during the procedure.  Inpatient Visitation:    Visiting hours are 7 a.m. to 8 p.m. Inpatients will be allowed two visitors daily. The visitors may change each day during the patient's stay. No visitors under the age of 51. Any visitor under the age of 29 must be accompanied by an adult. The visitor must pass COVID-19 screenings, use hand sanitizer when entering and exiting the patient's room and wear a mask at all times, including in the patient's room. Patients must also wear a mask when staff or their visitor are in the room. Masking is required regardless of vaccination status.

## 2021-04-04 NOTE — Patient Instructions (Addendum)
Your procedure is scheduled on: 04/15/2021 Report to the Registration Desk on the 1st floor of the [facility]. To find out your arrival time, please call [phone] between 1PM - 3PM on: 04/14/2021  REMEMBER: Instructions that are not followed completely may result in serious medical risk, up to and including death; or upon the discretion of your surgeon and anesthesiologist your surgery may need to be rescheduled.  Do not eat food after midnight the night before surgery.  No gum chewing, lozengers or hard candies.  You may however, drink CLEAR liquids up to 2 hours before you are scheduled to arrive for your surgery. Do not drink anything within 2 hours of your scheduled arrival time.  Clear liquids include: - water  - apple juice without pulp - gatorade (not RED, PURPLE, OR BLUE) - black coffee or tea (Do NOT add milk or creamers to the coffee or tea) Do NOT drink anything that is not on this list.   In addition, your doctor has ordered for you to drink the provided  Ensure Pre-Surgery Clear Carbohydrate Drink   Drinking this carbohydrate drink up to two hours before surgery helps to reduce insulin resistance and improve patient outcomes. Please complete drinking 2 hours prior to scheduled arrival time.  TAKE THESE MEDICATIONS THE MORNING OF SURGERY WITH A SIP OF WATER:  Abilify Lipitor Buspar Coreg Zetia Gabapentin leflunomide (ARAVA) Omeprazole  (take one the night before and one on the morning of surgery - helps to prevent nausea after surgery.) Potassium chloride Zoloft Topamax Tramadol as needed Effexor Guafenesin as needed valtrex   Use atrovent nebulizer as needed and Fluticasone-Umeclidin-Vilant inhaler at home.   One week prior to surgery: Stop Anti-inflammatories (NSAIDS) such as Advil, Aleve, Ibuprofen, Motrin, Naproxen, Naprosyn and Aspirin based products such as Excedrin, Goodys Powder, BC Powder, mobic, Stop ANY OVER THE COUNTER supplements until  after surgery like multivitamin, nutritional supplements, Vit B 12, Vit E,  You may however, continue to take Tylenol if needed for pain up until the day of surgery.   No Alcohol for 24 hours before or after surgery.  No Smoking including e-cigarettes for 24 hours prior to surgery.  No chewable tobacco products for at least 6 hours prior to surgery.  No nicotine patches on the day of surgery.  Do not use any "recreational" drugs for at least a week prior to your surgery.  Please be advised that the combination of cocaine and anesthesia may have negative outcomes, up to and including death. If you test positive for cocaine, your surgery will be cancelled.  On the morning of surgery brush your teeth with toothpaste and water, you may rinse your mouth with mouthwash if you wish. Do not swallow any toothpaste or mouthwash.  Do not wear jewelry, make-up, hairpins, clips or nail polish.  Notify your nurse  for any metal, electronic implants, in your body.  Do not wear lotions, powders, or perfumes.   Do not shave body from the neck down 48 hours prior to surgery just in case you cut yourself which could leave a site for infection.  Also, freshly shaved skin may become irritated if using the CHG soap.  Contact lenses, hearing aids and dentures may not be worn into surgery.   Do not bring valuables to the hospital. [facility] is not responsible for any missing/lost belongings or valuables.   Use CHG Soap or wipes as directed on instruction sheet.   Notify your doctor if there is any change  in your medical condition (cold, fever, infection).  Wear comfortable clothing (specific to your surgery type) to the hospital.  After surgery, you can help prevent lung complications by doing breathing exercises.  Take deep breaths and cough every 1-2 hours. Your doctor may order a device called an Incentive Spirometer to help you take deep breaths. When coughing or sneezing, hold a pillow firmly  against your incision with both hands. This is called "splinting." Doing this helps protect your incision. It also decreases belly discomfort.  If you are being admitted to the hospital overnight, leave your suitcase in the car. After surgery it may be brought to your room.  If you are being discharged the day of surgery, you will not be allowed to drive home. You will need a responsible adult (18 years or older) to drive you home and stay with you that night.   If you are taking public transportation, you will need to have a responsible adult (18 years or older) with you. Please confirm with your physician that it is acceptable to use public transportation.   Please call the [facility] Dept[facility] at [phone] if you have any questions about these instructions.  Surgery Visitation Policy:  Patients undergoing a surgery or procedure may have one family member or support person with them as long as that person is not COVID-19 positive or experiencing its symptoms.  That person may remain in the waiting area during the procedure.  Inpatient Visitation:    Visiting hours are 7 a.m. to 8 p.m. Inpatients will be allowed two visitors daily. The visitors may change each day during the patient's stay. No visitors under the age of 69. Any visitor under the age of 47 must be accompanied by an adult. The visitor must pass COVID-19 screenings, use hand sanitizer when entering and exiting the patient's room and wear a mask at all times, including in the patient's room. Patients must also wear a mask when staff or their visitor are in the room. Masking is required regardless of vaccination status.

## 2021-04-11 ENCOUNTER — Other Ambulatory Visit: Payer: Self-pay

## 2021-04-11 ENCOUNTER — Other Ambulatory Visit
Admission: RE | Admit: 2021-04-11 | Discharge: 2021-04-11 | Disposition: A | Discharge status: Home / Self Care | Payer: PPO | Source: Ambulatory Visit | Attending: Orthopedic Surgery | Admitting: Orthopedic Surgery

## 2021-04-11 DIAGNOSIS — Z20822 Contact with and (suspected) exposure to covid-19: Secondary | ICD-10-CM | POA: Insufficient documentation

## 2021-04-11 DIAGNOSIS — Z01812 Encounter for preprocedural laboratory examination: Secondary | ICD-10-CM | POA: Diagnosis not present

## 2021-04-11 LAB — SARS CORONAVIRUS 2 (TAT 6-24 HRS): SARS Coronavirus 2: NEGATIVE

## 2021-04-14 MED ORDER — CHLORHEXIDINE GLUCONATE 0.12 % MT SOLN: 15.0000 mL | Freq: Once | OROMUCOSAL | Status: AC

## 2021-04-14 MED ORDER — ORAL CARE MOUTH RINSE: 15.0000 mL | Freq: Once | OROMUCOSAL | Status: AC

## 2021-04-14 MED ORDER — LACTATED RINGERS IV SOLN: INTRAVENOUS | Status: DC

## 2021-04-14 MED ORDER — CEFAZOLIN SODIUM-DEXTROSE 2-4 GM/100ML-% IV SOLN
2.0000 g | INTRAVENOUS | Status: AC
  Administered 2021-04-15: 2 g via INTRAVENOUS

## 2021-04-15 ENCOUNTER — Encounter
Admission: RE | Disposition: A | Discharge status: Home Health | Payer: Self-pay | Source: Home / Self Care | Attending: Orthopedic Surgery

## 2021-04-15 ENCOUNTER — Inpatient Hospital Stay: Payer: PPO | Admitting: Urgent Care

## 2021-04-15 ENCOUNTER — Other Ambulatory Visit: Payer: Self-pay

## 2021-04-15 ENCOUNTER — Inpatient Hospital Stay: Payer: PPO

## 2021-04-15 ENCOUNTER — Encounter: Payer: Self-pay | Admitting: Orthopedic Surgery

## 2021-04-15 ENCOUNTER — Inpatient Hospital Stay
Admission: RE | Admit: 2021-04-15 | Discharge: 2021-04-18 | DRG: 470 | Disposition: A | Discharge status: Home Health | Payer: PPO | Attending: Orthopedic Surgery | Admitting: Orthopedic Surgery

## 2021-04-15 DIAGNOSIS — I10 Essential (primary) hypertension: Secondary | ICD-10-CM | POA: Diagnosis not present

## 2021-04-15 DIAGNOSIS — Z79899 Other long term (current) drug therapy: Secondary | ICD-10-CM

## 2021-04-15 DIAGNOSIS — Z981 Arthrodesis status: Secondary | ICD-10-CM

## 2021-04-15 DIAGNOSIS — J449 Chronic obstructive pulmonary disease, unspecified: Secondary | ICD-10-CM | POA: Diagnosis not present

## 2021-04-15 DIAGNOSIS — Z888 Allergy status to other drugs, medicaments and biological substances status: Secondary | ICD-10-CM

## 2021-04-15 DIAGNOSIS — Z791 Long term (current) use of non-steroidal anti-inflammatories (NSAID): Secondary | ICD-10-CM

## 2021-04-15 DIAGNOSIS — F32A Depression, unspecified: Secondary | ICD-10-CM | POA: Diagnosis not present

## 2021-04-15 DIAGNOSIS — G8918 Other acute postprocedural pain: Secondary | ICD-10-CM

## 2021-04-15 DIAGNOSIS — M1611 Unilateral primary osteoarthritis, right hip: Principal | ICD-10-CM | POA: Diagnosis present

## 2021-04-15 DIAGNOSIS — Z96641 Presence of right artificial hip joint: Secondary | ICD-10-CM

## 2021-04-15 DIAGNOSIS — K7460 Unspecified cirrhosis of liver: Secondary | ICD-10-CM | POA: Diagnosis not present

## 2021-04-15 DIAGNOSIS — Z419 Encounter for procedure for purposes other than remedying health state, unspecified: Secondary | ICD-10-CM

## 2021-04-15 DIAGNOSIS — M0600 Rheumatoid arthritis without rheumatoid factor, unspecified site: Secondary | ICD-10-CM | POA: Diagnosis present

## 2021-04-15 DIAGNOSIS — D62 Acute posthemorrhagic anemia: Secondary | ICD-10-CM | POA: Diagnosis not present

## 2021-04-15 DIAGNOSIS — E7800 Pure hypercholesterolemia, unspecified: Secondary | ICD-10-CM | POA: Diagnosis not present

## 2021-04-15 DIAGNOSIS — K219 Gastro-esophageal reflux disease without esophagitis: Secondary | ICD-10-CM | POA: Diagnosis present

## 2021-04-15 DIAGNOSIS — Z7951 Long term (current) use of inhaled steroids: Secondary | ICD-10-CM

## 2021-04-15 DIAGNOSIS — Z471 Aftercare following joint replacement surgery: Secondary | ICD-10-CM | POA: Diagnosis not present

## 2021-04-15 HISTORY — DX: Presence of other specified functional implants: Z96.89

## 2021-04-15 HISTORY — PX: TOTAL HIP ARTHROPLASTY: SHX124

## 2021-04-15 LAB — CBC
HCT: 33 % — ABNORMAL LOW (ref 36.0–46.0)
Hemoglobin: 10.3 g/dL — ABNORMAL LOW (ref 12.0–15.0)
MCH: 31 pg (ref 26.0–34.0)
MCHC: 31.2 g/dL (ref 30.0–36.0)
MCV: 99.4 fL (ref 80.0–100.0)
Platelets: 251 10*3/uL (ref 150–400)
RBC: 3.32 MIL/uL — ABNORMAL LOW (ref 3.87–5.11)
RDW: 12.8 % (ref 11.5–15.5)
WBC: 10.4 10*3/uL (ref 4.0–10.5)
nRBC: 0 % (ref 0.0–0.2)

## 2021-04-15 LAB — CREATININE, SERUM
Creatinine, Ser: 0.95 mg/dL (ref 0.44–1.00)
GFR, Estimated: >60 mL/min (ref >60)

## 2021-04-15 SURGERY — ARTHROPLASTY, HIP, TOTAL, ANTERIOR APPROACH
Anesthesia: General | Site: Hip | Laterality: Right

## 2021-04-15 MED ORDER — FENTANYL CITRATE (PF) 100 MCG/2ML IJ SOLN
INTRAMUSCULAR | Status: DC | PRN
  Administered 2021-04-15 (×4): 50 ug via INTRAVENOUS

## 2021-04-15 MED ORDER — PANTOPRAZOLE SODIUM 40 MG PO TBEC
80.0000 mg | DELAYED_RELEASE_TABLET | Freq: Every day | ORAL | Status: DC
  Administered 2021-04-16 – 2021-04-18 (×3): 80 mg via ORAL
  Filled 2021-04-15 (×3): qty 2

## 2021-04-15 MED ORDER — ONDANSETRON HCL 4 MG/2ML IJ SOLN
INTRAMUSCULAR | Status: DC | PRN
  Administered 2021-04-15: 4 mg via INTRAVENOUS

## 2021-04-15 MED ORDER — BLISTEX MEDICATED EX OINT
TOPICAL_OINTMENT | CUTANEOUS | Status: DC | PRN
  Filled 2021-04-15: qty 6.3

## 2021-04-15 MED ORDER — PHENYLEPHRINE HCL (PRESSORS) 10 MG/ML IV SOLN
INTRAVENOUS | Status: DC | PRN
  Administered 2021-04-15: 100 ug via INTRAVENOUS

## 2021-04-15 MED ORDER — SODIUM CHLORIDE 0.9 % IR SOLN
Status: DC | PRN
  Administered 2021-04-15: 1004 mL

## 2021-04-15 MED ORDER — LEFLUNOMIDE 20 MG PO TABS
10.0000 mg | ORAL_TABLET | Freq: Every day | ORAL | Status: DC
  Administered 2021-04-16 – 2021-04-17 (×2): 10 mg via ORAL
  Filled 2021-04-15 (×3): qty 0.5

## 2021-04-15 MED ORDER — PHENOL 1.4 % MT LIQD
1.0000 | OROMUCOSAL | Status: DC | PRN
  Filled 2021-04-15: qty 177

## 2021-04-15 MED ORDER — SODIUM CHLORIDE 0.9 % IV SOLN: INTRAVENOUS | Status: DC

## 2021-04-15 MED ORDER — HYDROCODONE-ACETAMINOPHEN 5-325 MG PO TABS
1.0000 | ORAL_TABLET | ORAL | Status: DC | PRN
  Administered 2021-04-15: 2 via ORAL
  Filled 2021-04-15: qty 2

## 2021-04-15 MED ORDER — MIDAZOLAM HCL 2 MG/2ML IJ SOLN
INTRAMUSCULAR | Status: AC
  Filled 2021-04-15: qty 2

## 2021-04-15 MED ORDER — METOCLOPRAMIDE HCL 5 MG/ML IJ SOLN: 5.0000 mg | Freq: Three times a day (TID) | INTRAMUSCULAR | Status: DC | PRN

## 2021-04-15 MED ORDER — CHLORHEXIDINE GLUCONATE 0.12 % MT SOLN
OROMUCOSAL | Status: AC
  Administered 2021-04-15: 15 mL via OROMUCOSAL
  Filled 2021-04-15: qty 15

## 2021-04-15 MED ORDER — METHOCARBAMOL 500 MG PO TABS: 500.0000 mg | ORAL_TABLET | Freq: Four times a day (QID) | ORAL | Status: DC | PRN

## 2021-04-15 MED ORDER — TOPIRAMATE 25 MG PO TABS
25.0000 mg | ORAL_TABLET | Freq: Two times a day (BID) | ORAL | Status: DC
  Administered 2021-04-15 – 2021-04-18 (×6): 25 mg via ORAL
  Filled 2021-04-15 (×7): qty 1

## 2021-04-15 MED ORDER — POLYETHYLENE GLYCOL 3350 17 G PO PACK
17.0000 g | PACK | Freq: Every day | ORAL | Status: DC | PRN
  Administered 2021-04-17: 17 g via ORAL
  Filled 2021-04-15: qty 1

## 2021-04-15 MED ORDER — BUSPIRONE HCL 10 MG PO TABS
30.0000 mg | ORAL_TABLET | Freq: Three times a day (TID) | ORAL | Status: DC
  Administered 2021-04-15 – 2021-04-18 (×9): 30 mg via ORAL
  Filled 2021-04-15 (×9): qty 3

## 2021-04-15 MED ORDER — PANTOPRAZOLE SODIUM 40 MG PO TBEC: 40.0000 mg | DELAYED_RELEASE_TABLET | Freq: Every day | ORAL | Status: DC

## 2021-04-15 MED ORDER — ACETAMINOPHEN 325 MG PO TABS: 325.0000 mg | ORAL_TABLET | Freq: Four times a day (QID) | ORAL | Status: DC | PRN

## 2021-04-15 MED ORDER — FLUTICASONE PROPIONATE 50 MCG/ACT NA SUSP
2.0000 | Freq: Every day | NASAL | Status: DC | PRN
  Filled 2021-04-15: qty 16

## 2021-04-15 MED ORDER — ONDANSETRON HCL 4 MG/2ML IJ SOLN: 4.0000 mg | Freq: Four times a day (QID) | INTRAMUSCULAR | Status: DC | PRN

## 2021-04-15 MED ORDER — GABAPENTIN 600 MG PO TABS
600.0000 mg | ORAL_TABLET | Freq: Three times a day (TID) | ORAL | Status: DC
  Administered 2021-04-15 – 2021-04-18 (×9): 600 mg via ORAL
  Filled 2021-04-15 (×9): qty 1

## 2021-04-15 MED ORDER — BISACODYL 10 MG RE SUPP: 10.0000 mg | Freq: Every day | RECTAL | Status: DC | PRN

## 2021-04-15 MED ORDER — VALACYCLOVIR HCL 500 MG PO TABS
2000.0000 mg | ORAL_TABLET | Freq: Two times a day (BID) | ORAL | Status: DC | PRN
  Filled 2021-04-15: qty 4

## 2021-04-15 MED ORDER — LIDOCAINE HCL (CARDIAC) PF 100 MG/5ML IV SOSY
PREFILLED_SYRINGE | INTRAVENOUS | Status: DC | PRN
  Administered 2021-04-15: 80 mg via INTRAVENOUS

## 2021-04-15 MED ORDER — ALUM & MAG HYDROXIDE-SIMETH 200-200-20 MG/5ML PO SUSP: 30.0000 mL | ORAL | Status: DC | PRN

## 2021-04-15 MED ORDER — PROPOFOL 10 MG/ML IV BOLUS
INTRAVENOUS | Status: DC | PRN
  Administered 2021-04-15: 150 mg via INTRAVENOUS

## 2021-04-15 MED ORDER — ADULT MULTIVITAMIN W/MINERALS CH
1.0000 | ORAL_TABLET | Freq: Every day | ORAL | Status: DC
  Administered 2021-04-16 – 2021-04-18 (×3): 1 via ORAL
  Filled 2021-04-15 (×3): qty 1

## 2021-04-15 MED ORDER — UMECLIDINIUM BROMIDE 62.5 MCG/INH IN AEPB
1.0000 | INHALATION_SPRAY | Freq: Every day | RESPIRATORY_TRACT | Status: DC
  Administered 2021-04-15 – 2021-04-18 (×4): 1 via RESPIRATORY_TRACT
  Filled 2021-04-15: qty 7

## 2021-04-15 MED ORDER — LINACLOTIDE 145 MCG PO CAPS
145.0000 ug | ORAL_CAPSULE | Freq: Every day | ORAL | Status: DC
  Administered 2021-04-16 – 2021-04-18 (×3): 145 ug via ORAL
  Filled 2021-04-15 (×3): qty 1

## 2021-04-15 MED ORDER — EZETIMIBE 10 MG PO TABS
10.0000 mg | ORAL_TABLET | Freq: Every day | ORAL | Status: DC
  Administered 2021-04-16 – 2021-04-18 (×3): 10 mg via ORAL
  Filled 2021-04-15 (×3): qty 1

## 2021-04-15 MED ORDER — ATORVASTATIN CALCIUM 20 MG PO TABS
40.0000 mg | ORAL_TABLET | Freq: Every day | ORAL | Status: DC
  Administered 2021-04-16 – 2021-04-18 (×3): 40 mg via ORAL
  Filled 2021-04-15 (×3): qty 2

## 2021-04-15 MED ORDER — VENLAFAXINE HCL ER 75 MG PO CP24
225.0000 mg | ORAL_CAPSULE | Freq: Every day | ORAL | Status: DC
  Administered 2021-04-16 – 2021-04-18 (×3): 225 mg via ORAL
  Filled 2021-04-15 (×3): qty 1

## 2021-04-15 MED ORDER — FUROSEMIDE 20 MG PO TABS: 20.0000 mg | ORAL_TABLET | Freq: Every day | ORAL | Status: DC | PRN

## 2021-04-15 MED ORDER — MORPHINE SULFATE (PF) 2 MG/ML IV SOLN: 0.5000 mg | INTRAVENOUS | Status: DC | PRN

## 2021-04-15 MED ORDER — ENOXAPARIN SODIUM 40 MG/0.4ML IJ SOSY
40.0000 mg | PREFILLED_SYRINGE | INTRAMUSCULAR | Status: DC
  Administered 2021-04-16 – 2021-04-18 (×3): 40 mg via SUBCUTANEOUS
  Filled 2021-04-15 (×3): qty 0.4

## 2021-04-15 MED ORDER — FLUTICASONE-UMECLIDIN-VILANT 100-62.5-25 MCG/INH IN AEPB: 1.0000 | INHALATION_SPRAY | Freq: Every day | RESPIRATORY_TRACT | Status: DC

## 2021-04-15 MED ORDER — ACYCLOVIR 5 % EX OINT: 1.0000 "application " | TOPICAL_OINTMENT | CUTANEOUS | Status: DC | PRN

## 2021-04-15 MED ORDER — TRAMADOL HCL 50 MG PO TABS
50.0000 mg | ORAL_TABLET | Freq: Four times a day (QID) | ORAL | Status: DC
Start: 2021-04-15 — End: 2021-04-18
  Administered 2021-04-15 – 2021-04-18 (×12): 50 mg via ORAL
  Filled 2021-04-15 (×13): qty 1

## 2021-04-15 MED ORDER — ROCURONIUM BROMIDE 100 MG/10ML IV SOLN
INTRAVENOUS | Status: DC | PRN
  Administered 2021-04-15: 20 mg via INTRAVENOUS
  Administered 2021-04-15: 50 mg via INTRAVENOUS

## 2021-04-15 MED ORDER — FENTANYL CITRATE (PF) 100 MCG/2ML IJ SOLN
INTRAMUSCULAR | Status: AC
  Filled 2021-04-15: qty 2

## 2021-04-15 MED ORDER — FLUTICASONE FUROATE-VILANTEROL 100-25 MCG/INH IN AEPB
1.0000 | INHALATION_SPRAY | Freq: Every day | RESPIRATORY_TRACT | Status: DC
  Filled 2021-04-15: qty 28

## 2021-04-15 MED ORDER — CEFAZOLIN SODIUM-DEXTROSE 2-4 GM/100ML-% IV SOLN
INTRAVENOUS | Status: AC
  Filled 2021-04-15: qty 100

## 2021-04-15 MED ORDER — ARIPIPRAZOLE 2 MG PO TABS
10.0000 mg | ORAL_TABLET | Freq: Every day | ORAL | Status: DC
  Administered 2021-04-16 – 2021-04-18 (×3): 10 mg via ORAL
  Filled 2021-04-15 (×3): qty 5

## 2021-04-15 MED ORDER — ACETAMINOPHEN 10 MG/ML IV SOLN
INTRAVENOUS | Status: DC | PRN
  Administered 2021-04-15: 1000 mg via INTRAVENOUS

## 2021-04-15 MED ORDER — BUPIVACAINE-EPINEPHRINE (PF) 0.25% -1:200000 IJ SOLN
INTRAMUSCULAR | Status: AC
  Filled 2021-04-15: qty 30

## 2021-04-15 MED ORDER — BUPIVACAINE LIPOSOME 1.3 % IJ SUSP
INTRAMUSCULAR | Status: AC
  Filled 2021-04-15: qty 20

## 2021-04-15 MED ORDER — HYDROCODONE-ACETAMINOPHEN 7.5-325 MG PO TABS
1.0000 | ORAL_TABLET | ORAL | Status: DC | PRN
  Administered 2021-04-16 (×2): 2 via ORAL
  Administered 2021-04-17: 1 via ORAL
  Filled 2021-04-15: qty 1
  Filled 2021-04-15 (×2): qty 2

## 2021-04-15 MED ORDER — SODIUM CHLORIDE FLUSH 0.9 % IV SOLN
INTRAVENOUS | Status: AC
  Filled 2021-04-15: qty 40

## 2021-04-15 MED ORDER — FLEET ENEMA 7-19 GM/118ML RE ENEM: 1.0000 | ENEMA | Freq: Once | RECTAL | Status: DC | PRN

## 2021-04-15 MED ORDER — POTASSIUM CHLORIDE CRYS ER 20 MEQ PO TBCR
20.0000 meq | EXTENDED_RELEASE_TABLET | Freq: Every day | ORAL | Status: DC
  Administered 2021-04-16 – 2021-04-18 (×3): 20 meq via ORAL
  Filled 2021-04-15 (×3): qty 1

## 2021-04-15 MED ORDER — FENTANYL CITRATE (PF) 100 MCG/2ML IJ SOLN
INTRAMUSCULAR | Status: AC
  Administered 2021-04-15: 25 ug
  Filled 2021-04-15: qty 2

## 2021-04-15 MED ORDER — ONDANSETRON HCL 4 MG PO TABS: 4.0000 mg | ORAL_TABLET | Freq: Four times a day (QID) | ORAL | Status: DC | PRN

## 2021-04-15 MED ORDER — NEOMYCIN-POLYMYXIN B GU 40-200000 IR SOLN
Status: AC
  Filled 2021-04-15: qty 4

## 2021-04-15 MED ORDER — CEFAZOLIN SODIUM-DEXTROSE 2-4 GM/100ML-% IV SOLN
2.0000 g | Freq: Four times a day (QID) | INTRAVENOUS | Status: AC
  Administered 2021-04-15 (×2): 2 g via INTRAVENOUS
  Filled 2021-04-15 (×2): qty 100

## 2021-04-15 MED ORDER — SODIUM CHLORIDE (PF) 0.9 % IJ SOLN
INTRAMUSCULAR | Status: DC | PRN
  Administered 2021-04-15: 90 mL

## 2021-04-15 MED ORDER — IPRATROPIUM BROMIDE 0.02 % IN SOLN: 0.5000 mg | Freq: Four times a day (QID) | RESPIRATORY_TRACT | Status: DC | PRN

## 2021-04-15 MED ORDER — SERTRALINE HCL 50 MG PO TABS
50.0000 mg | ORAL_TABLET | Freq: Every day | ORAL | Status: DC
  Administered 2021-04-16 – 2021-04-18 (×3): 50 mg via ORAL
  Filled 2021-04-15 (×3): qty 1

## 2021-04-15 MED ORDER — DOCUSATE SODIUM 100 MG PO CAPS
100.0000 mg | ORAL_CAPSULE | Freq: Two times a day (BID) | ORAL | Status: DC
  Administered 2021-04-15 – 2021-04-18 (×6): 100 mg via ORAL
  Filled 2021-04-15 (×6): qty 1

## 2021-04-15 MED ORDER — ACETAMINOPHEN 10 MG/ML IV SOLN
INTRAVENOUS | Status: AC
  Filled 2021-04-15: qty 100

## 2021-04-15 MED ORDER — METHOCARBAMOL 1000 MG/10ML IJ SOLN
500.0000 mg | Freq: Four times a day (QID) | INTRAVENOUS | Status: DC | PRN
  Filled 2021-04-15: qty 5

## 2021-04-15 MED ORDER — CARVEDILOL 12.5 MG PO TABS
12.5000 mg | ORAL_TABLET | Freq: Two times a day (BID) | ORAL | Status: DC
  Administered 2021-04-15 – 2021-04-17 (×4): 12.5 mg via ORAL
  Filled 2021-04-15 (×6): qty 1

## 2021-04-15 MED ORDER — DEXAMETHASONE SODIUM PHOSPHATE 10 MG/ML IJ SOLN
INTRAMUSCULAR | Status: DC | PRN
  Administered 2021-04-15: 10 mg via INTRAVENOUS

## 2021-04-15 MED ORDER — MENTHOL 3 MG MT LOZG
1.0000 | LOZENGE | OROMUCOSAL | Status: DC | PRN
  Filled 2021-04-15: qty 9

## 2021-04-15 MED ORDER — SODIUM CHLORIDE 0.9 % IV SOLN
INTRAVENOUS | Status: DC | PRN
  Administered 2021-04-15: 50 ug/min via INTRAVENOUS

## 2021-04-15 MED ORDER — DIPHENHYDRAMINE HCL 12.5 MG/5ML PO ELIX: 12.5000 mg | ORAL_SOLUTION | ORAL | Status: DC | PRN

## 2021-04-15 MED ORDER — LIFITEGRAST 5 % OP SOLN: 1.0000 [drp] | Freq: Every day | OPHTHALMIC | Status: DC

## 2021-04-15 MED ORDER — ZOLPIDEM TARTRATE 5 MG PO TABS: 5.0000 mg | ORAL_TABLET | Freq: Every evening | ORAL | Status: DC | PRN

## 2021-04-15 MED ORDER — METOCLOPRAMIDE HCL 10 MG PO TABS: 5.0000 mg | ORAL_TABLET | Freq: Three times a day (TID) | ORAL | Status: DC | PRN

## 2021-04-15 SURGICAL SUPPLY — 63 items
BLADE SAGITTAL AGGR TOOTH XLG (BLADE) ×2 IMPLANT
BNDG COHESIVE 6X5 TAN ST LF (GAUZE/BANDAGES/DRESSINGS) ×4 IMPLANT
CANISTER SUCT 1200ML W/VALVE (MISCELLANEOUS) IMPLANT
CANISTER WOUND CARE 500ML ATS (WOUND CARE) ×2 IMPLANT
CHLORAPREP W/TINT 26 (MISCELLANEOUS) ×2 IMPLANT
COVER BACK TABLE REUSABLE LG (DRAPES) ×2 IMPLANT
DRAPE 3/4 80X56 (DRAPES) ×6 IMPLANT
DRAPE C-ARM XRAY 36X54 (DRAPES) ×2 IMPLANT
DRAPE INCISE IOBAN 66X60 STRL (DRAPES) IMPLANT
DRAPE POUCH INSTRU U-SHP 10X18 (DRAPES) ×2 IMPLANT
DRESSING SURGICEL FIBRLLR 1X2 (HEMOSTASIS) ×2 IMPLANT
DRSG MEPILEX SACRM 8.7X9.8 (GAUZE/BANDAGES/DRESSINGS) ×2 IMPLANT
DRSG OPSITE POSTOP 4X8 (GAUZE/BANDAGES/DRESSINGS) IMPLANT
DRSG SURGICEL FIBRILLAR 1X2 (HEMOSTASIS) ×4
ELECT BLADE 6.5 EXT (BLADE) ×2 IMPLANT
ELECT REM PT RETURN 9FT ADLT (ELECTROSURGICAL) ×2
ELECTRODE REM PT RTRN 9FT ADLT (ELECTROSURGICAL) ×1 IMPLANT
GAUZE 4X4 16PLY ~~LOC~~+RFID DBL (SPONGE) ×2 IMPLANT
GLOVE SURG SYN 9.0  PF PI (GLOVE) ×2
GLOVE SURG SYN 9.0 PF PI (GLOVE) ×2 IMPLANT
GLOVE SURG UNDER POLY LF SZ9 (GLOVE) ×2 IMPLANT
GOWN SRG 2XL LVL 4 RGLN SLV (GOWNS) ×1 IMPLANT
GOWN STRL NON-REIN 2XL LVL4 (GOWNS) ×1
GOWN STRL REUS W/ TWL LRG LVL3 (GOWN DISPOSABLE) ×1 IMPLANT
GOWN STRL REUS W/TWL LRG LVL3 (GOWN DISPOSABLE) ×1
HEAD FEMORAL 28MM SZ S (Head) ×2 IMPLANT
HEMOVAC 400CC 10FR (MISCELLANEOUS) IMPLANT
HIP DBL LINER 54X28 (Liner) ×2 IMPLANT
HOLDER FOLEY CATH W/STRAP (MISCELLANEOUS) IMPLANT
HOOD PEEL AWAY FLYTE STAYCOOL (MISCELLANEOUS) ×2 IMPLANT
IRRIGATION SURGIPHOR STRL (IV SOLUTION) IMPLANT
KIT PREVENA INCISION MGT 13 (CANNISTER) ×2 IMPLANT
MANIFOLD NEPTUNE II (INSTRUMENTS) ×2 IMPLANT
MAT ABSORB  FLUID 56X50 GRAY (MISCELLANEOUS) ×1
MAT ABSORB FLUID 56X50 GRAY (MISCELLANEOUS) ×1 IMPLANT
NDL SAFETY ECLIPSE 18X1.5 (NEEDLE) ×1 IMPLANT
NEEDLE HYPO 18GX1.5 SHARP (NEEDLE) ×1
NEEDLE SPNL 20GX3.5 QUINCKE YW (NEEDLE) ×4 IMPLANT
NS IRRIG 1000ML POUR BTL (IV SOLUTION) ×2 IMPLANT
PACK HIP COMPR (MISCELLANEOUS) ×2 IMPLANT
SCALPEL PROTECTED #10 DISP (BLADE) ×4 IMPLANT
SHELL ACETABULAR SZ 54 DM (Shell) ×2 IMPLANT
SOL PREP PVP 2OZ (MISCELLANEOUS)
SOLUTION PREP PVP 2OZ (MISCELLANEOUS) IMPLANT
SPONGE DRAIN TRACH 4X4 STRL 2S (GAUZE/BANDAGES/DRESSINGS) IMPLANT
SPONGE T-LAP 18X18 ~~LOC~~+RFID (SPONGE) ×4 IMPLANT
STAPLER SKIN PROX 35W (STAPLE) IMPLANT
STEM FEMORAL SZ3  STD COLLARED (Stem) ×2 IMPLANT
STRAP SAFETY 5IN WIDE (MISCELLANEOUS) ×2 IMPLANT
SUT DVC 2 QUILL PDO  T11 36X36 (SUTURE) ×1
SUT DVC 2 QUILL PDO T11 36X36 (SUTURE) ×1 IMPLANT
SUT SILK 0 (SUTURE) ×1
SUT SILK 0 30XBRD TIE 6 (SUTURE) ×1 IMPLANT
SUT V-LOC 90 ABS DVC 3-0 CL (SUTURE) ×2 IMPLANT
SUT VIC AB 1 CT1 36 (SUTURE) ×2 IMPLANT
SYR 20ML LL LF (SYRINGE) ×2 IMPLANT
SYR 30ML LL (SYRINGE) ×2 IMPLANT
SYR 50ML LL SCALE MARK (SYRINGE) ×4 IMPLANT
SYR BULB IRRIG 60ML STRL (SYRINGE) ×2 IMPLANT
TAPE MICROFOAM 4IN (TAPE) IMPLANT
TOWEL OR 17X26 4PK STRL BLUE (TOWEL DISPOSABLE) ×2 IMPLANT
TRAY FOLEY MTR SLVR 16FR STAT (SET/KITS/TRAYS/PACK) IMPLANT
WATER STERILE IRR 500ML POUR (IV SOLUTION) IMPLANT

## 2021-04-15 NOTE — Op Note (Signed)
04/15/2021  10:49 AM  PATIENT:  Gabrielle Pineda  65 y.o. female  PRE-OPERATIVE DIAGNOSIS:  Primary localized osteoarthritis of right hip  M16.11 Right hip pain  M25.551  POST-OPERATIVE DIAGNOSIS:  Primary localized osteoarthritis of right hip  PROCEDURE:  Procedure(s): TOTAL HIP ARTHROPLASTY ANTERIOR APPROACH (Right)  SURGEON: Laurene Footman, MD  ASSISTANTS: None  ANESTHESIA:   general  EBL:  Total I/O In: 600 [I.V.:400; IV Piggyback:200] Out: 150 [Blood:150]  BLOOD ADMINISTERED:none  DRAINS:  Incisional wound VAC    LOCAL MEDICATIONS USED:  MARCAINE    and OTHER Exparel  SPECIMEN: Right femoral head  DISPOSITION OF SPECIMEN:  PATHOLOGY  COUNTS:  YES  TOURNIQUET:  * No tourniquets in log *  IMPLANTS: Medacta AMIS 3 standard stem with 54 mm Mpact TM cup and liner with ceramic S 28 mm head  DICTATION: .Dragon Dictation   The patient was brought to the operating room and after general anesthesia was obtained patient was placed on the operative table with the ipsilateral foot into the Medacta attachment, contralateral leg on a well-padded table. C-arm was brought in and preop template x-ray taken. After prepping and draping in usual sterile fashion appropriate patient identification and timeout procedures were completed. Anterior approach to the hip was obtained and centered over the greater trochanter and TFL muscle. The subcutaneous tissue was incised hemostasis being achieved by electrocautery. TFL fascia was incised and the muscle retracted laterally deep retractor placed. The lateral femoral circumflex vessels were identified and ligated. The anterior capsule was exposed and a capsulotomy performed. The neck was identified and a femoral neck cut carried out with a saw. The head was removed without difficulty and showed sclerotic femoral head and acetabulum. Reaming was carried out to 54 mm and a 54 mm cup trial gave appropriate tightness to the acetabular component a 54 DM  cup was impacted into position. The leg was then externally rotated and ischiofemoral and pubofemoral releases carried out. The femur was sequentially broached to a size 3, size 3 standard with S head trials were placed and the final components chosen. The 3 standard stem was inserted along with a ceramic S 28 mm head and 54 mm liner. The hip was reduced and was stable the wound was thoroughly irrigated with fibrillar placed along the posterior capsule and medial neck. The deep fascia ws closed using a heavy Quill after infiltration of 30 cc of quarter percent Sensorcaine with epinephrine diluted with Exparel throughout the case .3-0 V-loc to close the skin with skin staples.  Incisional wound VAC applied and patient was sent to recovery in stable condition.   PLAN OF CARE: Admit to inpatient

## 2021-04-15 NOTE — Evaluation (Signed)
Physical Therapy Evaluation Patient Details Name: Gabrielle Pineda MRN: [id number] DOB: 23-Jan-1956 Today's Date: 04/15/2021   History of Present Illness  Pt is a 65 y.o. F s/p R THA on 04/15/21. PMH includes COPD, HTN, anemia, C5-C6 disc surgery.  Clinical Impression  Pt alert, oriented x 4, pleasant and cooperative. Pt's PLOF includes ambulation w/ SPC intermittently due to R hip pain. Pt's spouse works part-time, but is able to take time off work if necessary to provide at home assistance.  Pt requires supervision for bed mobility using bed rails, HOB elevated. Pt required min-A, RW for transfers for momentum and steading. Stand-pivot bed > BSC pt had LOB x 1 requiring min-A for support. Pt ambulated BSC to recliner w/ min-guard, no LOB or instability noted w/ multimodal cues for RW technique. Skilled PT intervention is indicated to address deficits in function, mobility, and to return to PLOF as able.  Due to support at home, d/c recommendations are HHPT.    Follow Up Recommendations Home health PT;Supervision for mobility/OOB    Equipment Recommendations  None recommended by PT    Recommendations for Other Services       Precautions / Restrictions Precautions Precautions: Anterior Hip;Fall Precaution Booklet Issued: Yes (comment) Restrictions Weight Bearing Restrictions: Yes RLE Weight Bearing: Weight bearing as tolerated      Mobility  Bed Mobility Overal bed mobility: Needs Assistance Bed Mobility: Supine to Sit     Supine to sit: Supervision     General bed mobility comments: utilizes bed rails, HOB elevated    Transfers Overall transfer level: Needs assistance Equipment used: Rolling walker (2 wheeled) Transfers: Sit to/from [facility] Sit to Stand: Min assist Stand pivot transfers: Min assist       General transfer comment: sit <> stand min-A momentum, steadying at end of transfer; stand-pivot to [facility]: min-A due to LOB x1, required PT  assist  Ambulation/Gait Ambulation/Gait assistance: Min guard Gait Distance (Feet): 2 Feet Assistive device: Rolling walker (2 wheeled) Gait Pattern/deviations: Step-to pattern Gait velocity: Decreased   General Gait Details: Close min-gaurd due to LOB w/ stand-pivot transfer; able to step forward, manuever RW  Stairs            Wheelchair Mobility    Modified Rankin (Stroke Patients Only)       Balance Overall balance assessment: Needs assistance Sitting-balance support: Feet supported;Bilateral upper extremity supported Sitting balance-Leahy Scale: Good     Standing balance support: During functional activity;Bilateral upper extremity supported Standing balance-Leahy Scale: Poor Standing balance comment: LOB X 1 during stand > pivot transfer                             Pertinent Vitals/Pain Pain Assessment: 0-10 Pain Score: 8  Pain Location: R lateral hip Pain Descriptors / Indicators: Aching;Sore Pain Intervention(s): Limited activity within patient's tolerance;Monitored during session;Repositioned;Other (comment);Premedicated before session    [place] expects to be discharged to:: Private residence Living Arrangements: Spouse/significant other Available Help at Discharge: Family;Available PRN/intermittently (Spouse works part-time, able to stay home if necessary) Type of Home: House Home Access: Stairs to enter Entrance Stairs-Rails: None Technical brewer of Steps: 3 Home Layout: One level Home Equipment: Bedside commode;Shower seat;Walker - 2 wheels;Cane - single point Additional Comments: 6-7 steps w/ hand rail through front door    Prior Function Level of Independence: Needs assistance   Gait / Transfers Assistance Needed: Ambulates w/ SPC intermittently due to R  hip pain; min-A sit <> stand from spouse due to hip pain  ADL's / Homemaking Assistance Needed: Independent w/ ADLs  Comments: Pt denies fall hx last 6  months     Hand Dominance   Dominant Hand: Right    Extremity/Trunk Assessment   Upper Extremity Assessment Upper Extremity Assessment: Overall WFL for tasks assessed    Lower Extremity Assessment Lower Extremity Assessment: RLE deficits/detail;LLE deficits/detail RLE Deficits / Details: RLE not formally tested s/p THA RLE Sensation: WNL LLE Sensation: WNL       Communication   Communication: No difficulties  Cognition Arousal/Alertness: Awake/alert Behavior During Therapy: WFL for tasks assessed/performed Overall Cognitive Status: Within Functional Limits for tasks assessed                                 General Comments: AOx4      General Comments      Exercises Total Joint Exercises Ankle Circles/Pumps: AROM;Both;10 reps;Supine Quad Sets: AROM;Supine;10 reps;Right Heel Slides: AROM;10 reps;Right;Supine Hip ABduction/ADduction: AROM;Right;10 reps;Supine Other Exercises Other Exercises: Pt education on WB precautions and safety   Assessment/Plan    PT Assessment Patient needs continued PT services  PT Problem List Decreased strength;Decreased mobility;Decreased range of motion;Decreased activity tolerance;Decreased balance       PT Treatment Interventions Therapeutic activities;Gait training;Therapeutic exercise;Stair training;Balance training;Functional mobility training;Neuromuscular re-education    PT Goals (Current goals can be found in the Care Plan section)  Acute Rehab PT Goals Patient Stated Goal: To go home PT Goal Formulation: With patient Time For Goal Achievement: 04/29/21 Potential to Achieve Goals: Good    Frequency BID   Barriers to discharge        Co-evaluation               AM-PAC PT "6 Clicks" Mobility  Outcome Measure Help needed turning from your back to your side while in a flat bed without using bedrails?: None Help needed moving from lying on your back to sitting on the side of a flat bed without using  bedrails?: A Little Help needed moving to and from a bed to a chair (including a wheelchair)?: A Little Help needed standing up from a chair using your arms (e.g., wheelchair or bedside chair)?: A Little Help needed to walk in hospital room?: A Lot Help needed climbing 3-5 steps with a railing? : A Lot 6 Click Score: 17    End of Session Equipment Utilized During Treatment: Gait belt Activity Tolerance: Patient tolerated treatment well Patient left: in chair;with call bell/phone within reach;with chair alarm set;with family/visitor present;with SCD's reapplied   PT Visit Diagnosis: Unsteadiness on feet (R26.81);Muscle weakness (generalized) (M62.81)    Time: [id number] PT Time Calculation (min) (ACUTE ONLY): 46 min   Charges:              [employer], SPT

## 2021-04-15 NOTE — Transfer of Care (Signed)
Immediate Anesthesia Transfer of Care Note  Patient: Gabrielle Pineda  Procedure(s) Performed: TOTAL HIP ARTHROPLASTY ANTERIOR APPROACH (Right: Hip)  Patient Location: PACU  Anesthesia Type:General  Level of Consciousness: awake and sedated  Airway & Oxygen Therapy: Patient Spontanous Breathing and Patient connected to face mask oxygen  Post-op Assessment: Report given to RN and Post -op Vital signs reviewed and stable  Post vital signs: Reviewed and stable  Last Vitals:  Vitals Value Taken Time  BP    Temp    Pulse 72 04/15/21 1046  Resp 17 04/15/21 1046  SpO2 98 % 04/15/21 1046  Vitals shown include unvalidated device data.  Last Pain:  Vitals:   04/15/21 0806  TempSrc: Oral  PainSc: 3          Complications: No notable events documented.

## 2021-04-15 NOTE — H&P (Signed)
Chief Complaint  Patient presents with   Pre-op Exam  Right THA schedule 04/15/21 with Dr. Rudene Christians    History of the Present Illness: Gabrielle Pineda is a 65 y.o. female here today for history and physical for right total hip arthroplasty with Dr. Hessie Knows on 04/15/2021. Patient has been experiencing right hip pain x8 months. Pain is located to the posterior buttocks, anterior thigh and groin. Her pain will radiate down into her right knee. Pain is moderate to severe. Pain is worse with activity such as walking, stairs, activities involving hip flexion. Patient states her activities of daily living are limited approximately 50% from her baseline due to her right hip pain. She has had little relief with Tylenol arthritis. She has x-rays showing advanced right hip osteoarthritis with near complete loss of joint space and sclerotic changes and subchondral cyst formation of the right hip joint.  The patient's primary care physician is Vickki Muff Chrismon, PA.   The patient lives in [place] with her husband.  I have reviewed past medical, surgical, social and family history, and allergies as documented in the EMR.  Past Medical History: Past Medical History:  Diagnosis Date   Acid reflux 10/11/2014   Anemia   Arthritis or polyarthritis, rheumatoid (CMS-HCC) 01/30/2014  Overview: Overview: a. Rheumatoid factor negative, positive anti-CCP antibodies. b. Plaquenil. Last Assessment & Plan: She has follow-up with rheumatology   Arthritis, degenerative 01/30/2014  Overview: Overview: a. Cervical spine. b. Hands.   B-complex deficiency 09/09/2007   Chronic constipation   Clinical depression 06/26/2007   Closed fracture of distal phalanx of thumb 08/15/2014   CN (constipation) 06/26/2007   Cold sore 06/26/2007   Colon polyps   COPD (chronic obstructive pulmonary disease) (CMS-HCC)   Delayed onset of urination 04/03/2015   Depression   Disorder of iron metabolism 04/03/2015   Family history of malignant  neoplasm of pancreas 04/03/2015  Overview: Father.   GERD (gastroesophageal reflux disease)   GERD (gastroesophageal reflux disease) 10/11/2014   H/O alcohol abuse 06/26/2007   History of tachycardia   Hx of allergic rhinitis   Hypercholesteremia 04/03/2015   Hypercholesterolemia   Localized osteoarthrosis, hand 06/26/2007   Menopausal symptom 06/26/2007   Nonspecific abnormal electrocardiogram (ECG) (EKG) 09/22/2010  Overview: Qualifier: Diagnosis of By: Rockey Situ MD, Tim   Osteoarthritis   Other and unspecified hyperlipidemia 09/22/2010  Overview: Centricity Description: HYPERLIPIDEMIA Qualifier: Diagnosis of By: Rockey Situ MD, Tim Centricity Description: HYPERLIPIDEMIA-MIXED Qualifier: Diagnosis of By: Rockey Situ MD, Tim Last Assessment & Plan: Recommend that she stay on her pravastatin. Most recent lipid panel after she started pravastatin not available. Prior lipids she reports were without any statin   Paroxysmal supraventricular tachycardia (CMS-HCC) 09/22/2010  Overview: Qualifier: Diagnosis of By: Rockey Situ MD, Tim Last Assessment & Plan: She does report symptoms of fast heart rate. This is likely a sinus tachycardia, secondary to underlying shortness of breath. No changes in her medications made.   Rheumatoid arthritis without rheumatoid factor (CMS-HCC)   Smoker 09/22/2010  Overview: Qualifier: Diagnosis of By: Rockey Situ MD, Tim Last Assessment & Plan: We have encouraged her to continue to work on weaning her cigarettes and smoking cessation. She will continue to work on this and does not want any assistance with chantix.   Substance abuse (CMS-HCC)  Hx of alcoholism, abstinent since 1994, cigarettes   Uncomplicated herpes simplex   Weight loss 04/03/2015  Overview: Pap smear normal 01/2011; mammogram normal 01/2011; CXR normal 01/2011; colonoscopy normal 02/2011. EGD negative for malignancy 2012. CT  chest/abd/pelvis negative 06/2011.   Past Surgical History: Past Surgical History:  Procedure Laterality Date    ARTHROSCOPIC ROTATOR CUFF REPAIR 2017 & 2018   C5-C6 [place] SURGERY 03/2010   Cervical neck surgery 02/2013   COLONOSCOPY 07/06/2013, 04/17/2011  Adenomatous Polyp w/High Grade Dysplasia: CBF 09/2013   COLONOSCOPY 09/28/2013  F/U high grade dysplasia polyp site per RTE: [facility]BF 09/2016; Recall Ltr mailed 08/13/2016 (dw); Postpone until 08/2017 d/t neck surgery (dw); OV made 09/13/2017 @ 1:30pm w/Kim Jerelene Redden NP (dh)   COLONOSCOPY 11/01/2017  Adenomatous Polyp: CBF 10/2022   EGD 07/06/2013, 04/17/2011  No repeat per RTE   ENDOSCOPIC CARPAL TUNNEL RELEASE Bilateral   Thoracic spinal cord stimulator percutaneous & pulse generator placement 03/17/2021  Dr [name] at [facility], [place]   Past Family History: Family History  Problem Relation Age of Onset   Cancer Father   Pancreatic cancer Father   Irritable bowel syndrome Sister   Colon cancer Maternal Uncle   Cancer Paternal Uncle   Medications: Current Outpatient Medications Ordered in Epic  Medication Sig Dispense Refill   acyclovir (ZOVIRAX) 5 % cream   ARIPiprazole (ABILIFY) 10 MG tablet Take 10 mg by mouth every morning   atorvastatin (LIPITOR) 40 MG tablet Take by mouth   b complex vitamins tablet Take by mouth   busPIRone (BUSPAR) 30 MG tablet Take 30 mg by mouth 3 (three) times a day   butalbital-acetaminophen-caffeine (FIORICET) 50-325-40 mg tablet Take ONE tablet by MOUTH every EIGHT hours as needed FOR UP TO ONE DAY FOR HEADACHE   carvediloL (COREG) 12.5 MG tablet Take 1 tablet by mouth 2 (two) times daily   cholecalciferol (VITAMIN D3) 1,000 unit capsule Take 1,000 Units by mouth once daily   cyanocobalamin (VITAMIN B12) 1000 MCG tablet Take 1,000 mcg by mouth once daily   diltiazem (TIAZAC) 180 MG ER capsule Take 180 mg by mouth once daily   eszopiclone (LUNESTA) 3 mg tablet Take 1 tablet by mouth once daily   EZETIMIBE ORAL   FUROsemide (LASIX) 20 MG tablet TAKE 1 TABLET BY MOUTH ONCE DAILY  AS NEEDED FOR FLUID   gabapentin (NEURONTIN) 600 MG tablet Take 600 mg by mouth 3 (three) times daily   guaiFENesin 1,200 mg Ta12 Take by mouth   HYDROcodone-acetaminophen (NORCO) 5-325 mg tablet Take 1 tablet by mouth every 4 (four) hours as needed for Pain 30 tablet 0   ibuprofen (ADVIL,MOTRIN) 200 MG tablet Take by mouth   ipratropium (ATROVENT) 0.02 % nebulizer solution   leflunomide (ARAVA) 10 MG tablet TAKE ONE TABLET BY MOUTH EVERYDAY AT BEDTIME 90 tablet 1   lifitegrast (XIIDRA) 5 % ophthalmic solution   LINZESS 145 mcg capsule Take 145 mcg by mouth once daily   meloxicam (MOBIC) 15 MG tablet TAKE ONE TABLET BY MOUTH EVERYDAY AT BEDTIME 90 tablet 1   multivitamin tablet Take 1 tablet by mouth.   nicotine (NICODERM CQ) 14 mg/24 hr patch Place onto the skin   omeprazole (PRILOSEC) 20 MG DR capsule Take by mouth   potassium chloride (KLOR-CON) 20 MEQ ER tablet Take by mouth   pravastatin (PRAVACHOL) 40 MG tablet Take 40 mg by mouth once daily.   sertraline (ZOLOFT) 100 MG tablet Take 100 mg by mouth once daily.   soy-blk cohosh-green tea-yerba (ESTROVEN ENERGY) 56-40-130 mg Tab   topiramate (TOPAMAX) 25 MG tablet Take 25 mg by mouth 2 (two) times daily   traMADoL (ULTRAM) 50 mg tablet Take 2  tablets (100 mg total) by mouth every 6 (six) hours as needed for Pain 40 tablet 2   traZODone (DESYREL) 50 MG tablet Take 50 mg by mouth nightly as needed for Sleep.   TRELEGY ELLIPTA 100-62.5-25 mcg inhaler INHALE 1 PUFF BY MOUTH INTO LUNGS into THE lungs DAILY   valACYclovir (VALTREX) 1000 MG tablet TAKE TWO TABLETS BY MOUTH TWICE DAILY FOR 5 DAYS AS NEEDED   venlafaxine (EFFEXOR-XR) 75 MG XR capsule Take 225 mg by mouth once daily.   vitamin E 400 UNIT capsule Take by mouth   No current Epic-ordered facility-administered medications on file.   Allergies: Allergies  Allergen Reactions   Hydroxychloroquine Rash   Hydroxychloroquine Sulfate Rash    Body mass index is 23.92 kg/m.  Review  of Systems: A comprehensive 14 point ROS was performed, reviewed, and the pertinent orthopaedic findings are documented in the HPI.  Vitals:  04/01/21 1357  BP: 104/62    General Physical Examination:   General:  Well developed, well nourished, no apparent distress, normal affect, antalgic gait.   HEENT: Head normocephalic, atraumatic, PERRL.   Abdomen: Soft, non tender, non distended, Bowel sounds present.  Heart: Examination of the heart reveals regular, rate, and rhythm. There is no murmur noted on ascultation. There is a normal apical pulse.  Lungs: Lungs are clear to auscultation. There is no wheeze, rhonchi, or crackles. There is normal expansion of bilateral chest walls.   Musculoskeletal Examination: Right hip internal rotation is 10 degrees, external is 45 degrees without pain. No swelling warmth erythema or edema throughout the right lower extremity. Normal knee range of motion with normal ankle plantarflexion dorsiflexion right lower extremity.  Radiographs:  AP pelvis and lateral x-rays of the right hip were reviewed by me today. These show severe degenerative changes with extensive subchondral cysts in both acetabulum and femoral head, large osteophytes around the femoral head, as well as the inferior acetabulum. Joint space is nearly completely gone on this non-weight-bearing x-ray. Subchondral sclerosis on the acetabulum as well.  X-ray Impression Severe osteoarthritis, right hip, moderate osteoarthritis incidentally noted on the left hip, without cyst formation.  Assessment: ICD-10-CM  1. Primary localized osteoarthritis of right hip M16.11   Plan: 82. 65 year old female with severe right hip osteoarthritis. Pain has been progressively increasing x8 months. She has significant limitations of activities daily living secondary to pain. Risks, benefits, complications of a right total hip arthroplasty have been discussed with the patient. Patient has agreed and  consented to the procedure with Dr. Hessie Knows on 04/15/2021.   Electronically signed by Feliberto Gottron, [place] at 04/01/2021 2:14 PM EDT  Reviewed  H+P. No changes noted.

## 2021-04-15 NOTE — Anesthesia Preprocedure Evaluation (Signed)
Anesthesia Evaluation  Patient identified by MRN, date of birth, ID band Patient awake    Reviewed: Allergy & Precautions, NPO status , Patient's Chart, lab work & pertinent test results  History of Anesthesia Complications (+) DIFFICULT AIRWAY and history of anesthetic complications (difficult airway noted in her chart, previous anesthetic grade 1 with mcgrath )  Airway Mallampati: II  TM Distance: >3 FB Neck ROM: Full    Dental  (+) Poor Dentition   Pulmonary COPD,  COPD inhaler, former smoker,    breath sounds clear to auscultation- rhonchi (-) wheezing      Cardiovascular hypertension, Pt. on medications (-) CAD, (-) Past MI, (-) Cardiac Stents and (-) CABG  Rhythm:Regular Rate:Normal - Systolic murmurs and - Diastolic murmurs    Neuro/Psych  Headaches, neg Seizures PSYCHIATRIC DISORDERS Anxiety Depression    GI/Hepatic Neg liver ROS, GERD  ,  Endo/Other  negative endocrine ROSneg diabetes  Renal/GU negative Renal ROS     Musculoskeletal  (+) Arthritis ,   Abdominal (+) - obese,   Peds  Hematology  (+) anemia ,   Anesthesia Other Findings Past Medical History: No date: Alcohol abuse No date: Anemia No date: Anxiety No date: Arthritis No date: Cervicalgia 1994: Cirrhosis ([place]) No date: COPD (chronic obstructive pulmonary disease) (HCC) No date: Depression No date: Difficult intubation     Comment:  has plates and screws in neck No date: Dyspnea No date: GERD (gastroesophageal reflux disease) No date: Headache No date: Heart murmur     Comment:  on heard when pt is lying No date: Hypertension No date: Other and unspecified hyperlipidemia No date: Status post insertion of spinal cord stimulator No date: Tachycardia     Comment:  d/t questionable anxiety happens every 5- 10 years, sees              [facility]   Reproductive/Obstetrics                              Anesthesia Physical Anesthesia Plan  ASA: 3  Anesthesia Plan: General   Post-op Pain Management:    Induction: Intravenous  PONV Risk Score and Plan: 2 and Ondansetron, Dexamethasone and Midazolam  Airway Management Planned: Oral ETT and Video Laryngoscope Planned  Additional Equipment:   Intra-op Plan:   Post-operative Plan: Extubation in OR  Informed Consent: I have reviewed the patients History and Physical, chart, labs and discussed the procedure including the risks, benefits and alternatives for the proposed anesthesia with the patient or authorized representative who has indicated his/her understanding and acceptance.     Dental advisory given  Plan Discussed with: CRNA and Anesthesiologist  Anesthesia Plan Comments: (Plan for GA due to spinal cord stimulator )        Anesthesia Quick Evaluation

## 2021-04-15 NOTE — Anesthesia Procedure Notes (Signed)
Procedure Name: Intubation Date/Time: 04/15/2021 9:34 AM Performed by: Nelda Marseille, CRNA Pre-anesthesia Checklist: Patient identified, Patient being monitored, Timeout performed, Emergency Drugs available and Suction available Patient Re-evaluated:Patient Re-evaluated prior to induction Oxygen Delivery Method: Circle system utilized Preoxygenation: Pre-oxygenation with 100% oxygen Induction Type: IV induction Ventilation: Mask ventilation without difficulty Laryngoscope Size: Mac, 3 and McGraph Grade View: Grade II Tube type: Oral Tube size: 7.0 mm Number of attempts: 1 Airway Equipment and Method: Stylet Placement Confirmation: ETT inserted through vocal cords under direct vision, positive ETCO2 and breath sounds checked- equal and bilateral Secured at: 21 cm Tube secured with: Tape Dental Injury: Teeth and Oropharynx as per pre-operative assessment

## 2021-04-15 NOTE — Anesthesia Postprocedure Evaluation (Signed)
Anesthesia Post Note  Patient: Gabrielle Pineda  Procedure(s) Performed: TOTAL HIP ARTHROPLASTY ANTERIOR APPROACH (Right: Hip)  Patient location during evaluation: PACU Anesthesia Type: General Level of consciousness: awake and alert and oriented Pain management: pain level controlled Vital Signs Assessment: post-procedure vital signs reviewed and stable Respiratory status: spontaneous breathing, nonlabored ventilation and respiratory function stable Cardiovascular status: blood pressure returned to baseline and stable Postop Assessment: no signs of nausea or vomiting Anesthetic complications: no   No notable events documented.   Last Vitals:  Vitals:   04/15/21 1241 04/15/21 1350  BP: (!) 131/93 (!) 151/84  Pulse: 77 80  Resp: 20 16  Temp: 36.6 C 36.4 C  SpO2: 94% 96%    Last Pain:  Vitals:   04/15/21 1200  TempSrc:   PainSc: 8                  Erhard Senske

## 2021-04-16 ENCOUNTER — Other Ambulatory Visit: Payer: Self-pay | Admitting: Family Medicine

## 2021-04-16 DIAGNOSIS — F331 Major depressive disorder, recurrent, moderate: Secondary | ICD-10-CM

## 2021-04-16 LAB — BASIC METABOLIC PANEL
Anion gap: 5 (ref 5–15)
BUN: 12 mg/dL (ref 8–23)
CO2: 23 mmol/L (ref 22–32)
Calcium: 8.3 mg/dL — ABNORMAL LOW (ref 8.9–10.3)
Chloride: 111 mmol/L (ref 98–111)
Creatinine, Ser: 0.99 mg/dL (ref 0.44–1.00)
GFR, Estimated: >60 mL/min (ref >60)
Glucose, Bld: 134 mg/dL — ABNORMAL HIGH (ref 70–99)
Potassium: 3.5 mmol/L (ref 3.5–5.1)
Sodium: 139 mmol/L (ref 135–145)

## 2021-04-16 LAB — CBC
HCT: 28 % — ABNORMAL LOW (ref 36.0–46.0)
Hemoglobin: 8.8 g/dL — ABNORMAL LOW (ref 12.0–15.0)
MCH: 30.8 pg (ref 26.0–34.0)
MCHC: 31.4 g/dL (ref 30.0–36.0)
MCV: 97.9 fL (ref 80.0–100.0)
Platelets: 206 10*3/uL (ref 150–400)
RBC: 2.86 MIL/uL — ABNORMAL LOW (ref 3.87–5.11)
RDW: 12.6 % (ref 11.5–15.5)
WBC: 9.9 10*3/uL (ref 4.0–10.5)
nRBC: 0 % (ref 0.0–0.2)

## 2021-04-16 LAB — SURGICAL PATHOLOGY

## 2021-04-16 MED ORDER — MAGNESIUM HYDROXIDE 400 MG/5ML PO SUSP
30.0000 mL | Freq: Once | ORAL | Status: AC
  Administered 2021-04-16: 30 mL via ORAL
  Filled 2021-04-16: qty 30

## 2021-04-16 MED ORDER — FE FUMARATE-B12-VIT C-FA-IFC PO CAPS
1.0000 | ORAL_CAPSULE | Freq: Two times a day (BID) | ORAL | Status: DC
  Administered 2021-04-16 – 2021-04-18 (×5): 1 via ORAL
  Filled 2021-04-16 (×6): qty 1

## 2021-04-16 NOTE — Progress Notes (Signed)
Physical Therapy Treatment Patient Details Name: Gabrielle Pineda MRN: [id number] DOB: 1956-01-10 Today's Date: 04/16/2021    History of Present Illness Pt is a 65 y.o. F s/p R THA on 04/15/21. PMH includes COPD, HTN, anemia, C5-C6 disc surgery.    PT Comments    Pt alert, in bed w/ spouse present throughout treatment. Progressed ambulation distance 175 ft w/ RW, min-guard for cues. 2 seated rest breaks were necessary due to pain and fatigue. Nursing contacted to high pain levels 9/10 after mobility. Reviewed stair training with patient and spouse. Pt requires cues for stair technique, but able to recite w/ reminders. Skilled PT intervention is indicated to address deficits in function, mobility, and to return to PLOF as able.  Discharge recommendations are HHPT.    Follow Up Recommendations  Home health PT;Supervision for mobility/OOB     Equipment Recommendations  None recommended by PT    Recommendations for Other Services       Precautions / Restrictions Precautions Precautions: Anterior Hip;Fall Restrictions Weight Bearing Restrictions: Yes RLE Weight Bearing: Weight bearing as tolerated    Mobility  Bed Mobility Overal bed mobility: Needs Assistance Bed Mobility: Sit to Supine;Supine to Sit     Supine to sit: Min guard;HOB elevated Sit to supine: Min guard   General bed mobility comments: cues for technique without utilizing bed rails    Transfers Overall transfer level: Needs assistance Equipment used: Rolling walker (2 wheeled) Transfers: Sit to/from Stand Sit to Stand: Min guard Stand pivot transfers: Min guard       General transfer comment: pt demonstrates good concentric and eccentric control  Ambulation/Gait Ambulation/Gait assistance: Min guard Gait Distance (Feet): 175 Feet Assistive device: Rolling walker (2 wheeled) Gait Pattern/deviations: Step-through pattern;Trunk flexed     General Gait Details: Cues to utilize RW to decrease WB on RLE  due to increased pain; recliner follow for seated rest breaks x 2   Stairs             Wheelchair Mobility    Modified Rankin (Stroke Patients Only)       Balance Overall balance assessment: Needs assistance Sitting-balance support: Feet supported;No upper extremity supported Sitting balance-Leahy Scale: Good     Standing balance support: Bilateral upper extremity supported;During functional activity Standing balance-Leahy Scale: Fair Standing balance comment: Requires BUE support                            Cognition Arousal/Alertness: Awake/alert Behavior During Therapy: WFL for tasks assessed/performed Overall Cognitive Status: Within Functional Limits for tasks assessed                                 General Comments: AOx4      Exercises Other Exercises Other Exercises: bed <> bsc x 1, RW, min-gaurd, pt able to perform pericare seated w/ supervision Other Exercises: Pt education: reviewed stair training w/ pt and spouse, pt unable to cite stair technique without cues    General Comments        Pertinent Vitals/Pain Pain Assessment: 0-10 Pain Score: 9  Pain Location: R lateral hip Pain Descriptors / Indicators: Aching;Grimacing Pain Intervention(s): Limited activity within patient's tolerance;Monitored during session;Repositioned;Patient requesting pain meds-RN notified;Relaxation;Ice applied    Home Living Family/patient expects to be discharged to:: Private residence Living Arrangements: Spouse/significant other Available Help at Discharge: Family;Available PRN/intermittently (husband works nights) Type of Home: [employer]  Home Access: Stairs to enter Entrance Stairs-Rails: None Home Layout: One level Home Equipment: Bedside commode;Shower seat;Walker - 2 wheels;Cane - single point Additional Comments: 6-7 steps w/ hand rail through front door    Prior Function Level of Independence: Needs assistance  Gait / Transfers  Assistance Needed: Ambulates w/ SPC intermittently due to R hip pain; min-A sit <> stand from spouse due to hip pain ADL's / Homemaking Assistance Needed: Independent w/ ADLs Comments: Pt denies fall hx last 6 months   PT Goals (current goals can now be found in the care plan section) Acute Rehab PT Goals Patient Stated Goal: To get through pain Progress towards PT goals: Progressing toward goals    Frequency    BID      PT Plan Current plan remains appropriate    Co-evaluation              AM-PAC PT "6 Clicks" Mobility   Outcome Measure  Help needed turning from your back to your side while in a flat bed without using bedrails?: None Help needed moving from lying on your back to sitting on the side of a flat bed without using bedrails?: A Little Help needed moving to and from a bed to a chair (including a wheelchair)?: A Little Help needed standing up from a chair using your arms (e.g., wheelchair or bedside chair)?: A Little Help needed to walk in hospital room?: A Little Help needed climbing 3-5 steps with a railing? : A Little 6 Click Score: 19    End of Session Equipment Utilized During Treatment: Gait belt Activity Tolerance: Patient tolerated treatment well Patient left: with call bell/phone within reach;with SCD's reapplied;in bed;with bed alarm set;with family/visitor present Nurse Communication: Mobility status;Patient requests pain meds PT Visit Diagnosis: Unsteadiness on feet (R26.81);Muscle weakness (generalized) (M62.81)     Time: [id number] PT Time Calculation (min) (ACUTE ONLY): 42 min  Charges:                        [employer], SPT

## 2021-04-16 NOTE — Progress Notes (Signed)
   Subjective: 1 Day Post-Op Procedure(s) (LRB): TOTAL HIP ARTHROPLASTY ANTERIOR APPROACH (Right) Patient reports pain as mild.   Patient is well, and has had no acute complaints or problems Denies any CP, SOB, ABD pain. We will continue therapy today.  Plan is to go Home after hospital stay.  Objective: Vital signs in last 24 hours: Temp:  [97.2 F (36.2 C)-98.7 F (37.1 C)] 98.7 F (37.1 C) (08/31 0305) Pulse Rate:  [57-84] 84 (08/31 0305) Resp:  [10-20] 16 (08/31 0305) BP: (113-161)/(65-111) 120/65 (08/31 0305) SpO2:  [91 %-100 %] 95 % (08/31 0305)  Intake/Output from previous day: 08/30 0701 - 08/31 0700 In: 1220 [P.O.:120; I.V.:900; IV Piggyback:200] Out: 150 [Blood:150] Intake/Output this shift: No intake/output data recorded.  Recent Labs    04/15/21 1252 04/16/21 0426  HGB 10.3* 8.8*   Recent Labs    04/15/21 1252 04/16/21 0426  WBC 10.4 9.9  RBC 3.32* 2.86*  HCT 33.0* 28.0*  PLT 251 206   Recent Labs    04/15/21 1252 04/16/21 0426  NA  --  139  K  --  3.5  CL  --  111  CO2  --  23  BUN  --  12  CREATININE 0.95 0.99  GLUCOSE  --  134*  CALCIUM  --  8.3*   No results for input(s): LABPT, INR in the last 72 hours.  EXAM General - Patient is Alert, Appropriate, and Oriented Extremity - Neurovascular intact Sensation intact distally Intact pulses distally Dorsiflexion/Plantar flexion intact Dressing - dressing C/D/I and no drainage, scant drainage in prevena system Motor Function - intact, moving foot and toes well on exam.   Past Medical History:  Diagnosis Date   Alcohol abuse    Anemia    Anxiety    Arthritis    Cervicalgia    Cirrhosis ([place]) 1994   COPD (chronic obstructive pulmonary disease) ([place])    Depression    Difficult intubation    has plates and screws in neck   Dyspnea    GERD (gastroesophageal reflux disease)    Headache    Heart murmur    on heard when pt is lying   Hypertension    Other and unspecified  hyperlipidemia    Status post insertion of spinal cord stimulator    Tachycardia    d/t questionable anxiety happens every 5- 10 years, sees [facility]    Assessment/Plan:   1 Day Post-Op Procedure(s) (LRB): TOTAL HIP ARTHROPLASTY ANTERIOR APPROACH (Right) Active Problems:   Status post total hip replacement, right  Estimated body mass index is 24.29 kg/m as calculated from the following:   Height as of 04/04/21: '5\' 9"'$  (1.753 m).   Weight as of 04/04/21: 74.6 kg. Advance diet Up with therapy Pain well controlled VSS Acute post op blood loss anemia - Hgb 8.8, start Iron supplement Recheck labs in the am Work on BM CM to assist with discharge to home with HHPT   DVT Prophylaxis - Lovenox, Foot Pumps, and TED hose Weight-Bearing as tolerated to right leg   T. Rachelle Hora, PA-C [place] 04/16/2021, 8:06 AM

## 2021-04-16 NOTE — Telephone Encounter (Signed)
Requested medication (s) are due for refill today - yes  Requested medication (s) are on the active medication list -yes  Future visit scheduled -no  Last refill: 03/27/21  Notes to clinic: Patient is on taper per last visit- no follow up scheduled for taper/change- sent for review   Requested Prescriptions  Pending Prescriptions Disp Refills   sertraline (ZOLOFT) 50 MG tablet [Pharmacy Med Name: sertraline 50 mg tablet] 30 tablet 1    Sig: TAKE ONE TABLET BY MOUTH EVERYDAY AT BEDTIME     Psychiatry:  Antidepressants - SSRI Passed - 04/16/2021  8:43 AM      Passed - Completed PHQ-2 or PHQ-9 in the last 360 days      Passed - Valid encounter within last 6 months    Recent Outpatient Visits           4 months ago Primary insomnia   [facility], Vickki Muff, PA-C   5 months ago Hypokalemia   [facility], Clearnce Sorrel, [place]   1 year ago Acute non-recurrent pansinusitis   [facility] [place], Clearnce Sorrel, [place]   1 year ago Acute non-recurrent pansinusitis   [facility] [place], Clearnce Sorrel, PA-C   1 year ago GAD (generalized anxiety disorder)   [facility] [place], [place], [place]                 Requested Prescriptions  Pending Prescriptions Disp Refills   sertraline (ZOLOFT) 50 MG tablet [Pharmacy Med Name: sertraline 50 mg tablet] 30 tablet 1    Sig: TAKE ONE TABLET BY MOUTH EVERYDAY AT BEDTIME     Psychiatry:  Antidepressants - SSRI Passed - 04/16/2021  8:43 AM      Passed - Completed PHQ-2 or PHQ-9 in the last 360 days      Passed - Valid encounter within last 6 months    Recent Outpatient Visits           4 months ago Primary insomnia   [place], PA-C   5 months ago Hypokalemia   [facility], Clearnce Sorrel, [place]   1 year ago Acute non-recurrent pansinusitis   [place], [place]    1 year ago Acute non-recurrent pansinusitis   [place], PA-C   1 year ago GAD (generalized anxiety disorder)   [facility], [place], [place]

## 2021-04-16 NOTE — Progress Notes (Signed)
Physical Therapy Treatment Patient Details Name: Gabrielle Pineda MRN: [id number] DOB: 1956/04/05 Today's Date: 04/16/2021    History of Present Illness Pt is a 65 y.o. F s/p R THA on 04/15/21. PMH includes COPD, HTN, anemia, C5-C6 disc surgery.    PT Comments    Pt alert, seated in recliner, stating pain 7-8/10.  Premedicated before session, pt agreeable, pleasant to continue treatment. Progressed to stair training w/ min-guard. Pt able to mimic home set-up of 3 steps without hand rails w/ RW ascending backwards as well as front entry way of 6 steps w/ L hand rail going forward. Pt able to safely ascend/descend 8 steps in total using these two methods w/ min-guard. Ambulated w/ RW, min-guard for safety with pt requiring one seated rest break due to pain. Pt demonstrates slight anxiety during mobility, relaxation techniques utilized to decrease tension and fear w/ mobility. Skilled PT intervention is indicated to address deficits in function, mobility, and to return to PLOF as able.  Discharge recommendations are HHPT.   Follow Up Recommendations  Home health PT;Supervision for mobility/OOB     Equipment Recommendations  None recommended by PT    Recommendations for Other Services       Precautions / Restrictions Precautions Precautions: Anterior Hip;Fall Restrictions Weight Bearing Restrictions: Yes RLE Weight Bearing: Weight bearing as tolerated    Mobility  Bed Mobility               General bed mobility comments: Pt seated in recliner beginning of tx session    Transfers Overall transfer level: Needs assistance Equipment used: Rolling walker (2 wheeled) Transfers: Sit to/from Stand Sit to Stand: Min guard            Ambulation/Gait Ambulation/Gait assistance: Min guard Gait Distance (Feet): 95 Feet Assistive device: Rolling walker (2 wheeled) Gait Pattern/deviations: Step-through pattern         Stairs Stairs: Yes Stairs assistance: Min  guard Stair Management: Backwards;With walker Number of Stairs: 8 General stair comments: 4 steps backwards w/ RW, min-gaurd; 4 steps forwards w/ L hand rail, min-gaurd   Wheelchair Mobility    Modified Rankin (Stroke Patients Only)       Balance Overall balance assessment: Needs assistance Sitting-balance support: Feet supported;No upper extremity supported Sitting balance-Leahy Scale: Good     Standing balance support: Bilateral upper extremity supported;During functional activity Standing balance-Leahy Scale: Fair Standing balance comment: Requires BUE support                            Cognition                                              Exercises      General Comments        Pertinent Vitals/Pain Pain Assessment: 0-10 Pain Score: 8  Pain Location: R lateral hip Pain Descriptors / Indicators: Aching;Sore;Discomfort Pain Intervention(s): Limited activity within patient's tolerance;Monitored during session;Premedicated before session;Repositioned;Ice applied    Home Living                      Prior Function            PT Goals (current goals can now be found in the care plan section) Acute Rehab PT Goals Patient Stated Goal: To go home PT Goal Formulation:  With patient Time For Goal Achievement: 04/29/21 Potential to Achieve Goals: Good Progress towards PT goals: Progressing toward goals    Frequency    BID      PT Plan Current plan remains appropriate    Co-evaluation              AM-PAC PT "6 Clicks" Mobility   Outcome Measure  Help needed turning from your back to your side while in a flat bed without using bedrails?: None Help needed moving from lying on your back to sitting on the side of a flat bed without using bedrails?: A Little Help needed moving to and from a bed to a chair (including a wheelchair)?: A Little Help needed standing up from a chair using your arms (e.g., wheelchair or  bedside chair)?: A Little Help needed to walk in hospital room?: A Little Help needed climbing 3-5 steps with a railing? : A Little 6 Click Score: 19    End of Session Equipment Utilized During Treatment: Gait belt Activity Tolerance: Patient tolerated treatment well Patient left: in chair;with call bell/phone within reach;with chair alarm set;with SCD's reapplied;with nursing/sitter in room Nurse Communication: Mobility status;Patient requests pain meds PT Visit Diagnosis: Unsteadiness on feet (R26.81);Muscle weakness (generalized) (M62.81)     Time: [id number] PT Time Calculation (min) (ACUTE ONLY): 55 min  Charges:                        [employer], SPT

## 2021-04-16 NOTE — Progress Notes (Signed)
Met with the patient at the bedside to discuss DC plan and needs She lives at home with her husband She has a rolling walker, a bedside commode, a shower seat and a cane at home, will not need additional DME She is set up with [place] for [facility] services She is able to afford her medicaitons  She has transportation

## 2021-04-16 NOTE — Evaluation (Signed)
Occupational Therapy Evaluation Patient Details Name: Gabrielle Pineda MRN: [id number] DOB: [birth year] Today's Date: 04/16/2021    History of Present Illness Pt is a 65 y.o. F s/p R THA on 04/15/21. PMH includes COPD, HTN, anemia, C5-C6 disc surgery.   Clinical Impression   Pt seen for OT evaluation this date, POD#1 from above surgery. Pt was independent in all ADLs prior to surgery, however occasionally using RW or SPC for mobility due to R hip pain. Pt is eager to return to PLOF with less pain and improved safety and independence. Pt endorses 8/10 pain this AM. Pt currently requires Mod A for LB dressing while in seated position, Mod A for sit<supine, CGA for transfers. Pt instructed in self care skills, falls prevention strategies, home/routines modifications, DME/AE for LB bathing and dressing tasks, and car transfer techniques. Pt would benefit from additional instruction in self care skills and techniques, as well at [facility] post DC. Pt already owns all necessary DME.      Follow Up Recommendations  Home health OT    Equipment Recommendations  None recommended by OT    Recommendations for Other Services       Precautions / Restrictions Precautions Precautions: Anterior Hip;Fall Restrictions Weight Bearing Restrictions: Yes RLE Weight Bearing: Weight bearing as tolerated      Mobility Bed Mobility Overal bed mobility: Needs Assistance Bed Mobility: Sit to Supine       Sit to supine: Mod assist   General bed mobility comments: Required Mod A elevating R LE to bed level    Transfers Overall transfer level: Needs assistance Equipment used: Rolling walker (2 wheeled) Transfers: Sit to/from [facility] Sit to Stand: Min guard Stand pivot transfers: Min guard       General transfer comment: endorses increase in pain with WB, but maintained good balance, upright posture    Balance Overall balance assessment: Needs assistance Sitting-balance support:  Feet supported;No upper extremity supported Sitting balance-Leahy Scale: Good     Standing balance support: Bilateral upper extremity supported;During functional activity Standing balance-Leahy Scale: Fair Standing balance comment: Requires BUE support                           ADL either performed or assessed with clinical judgement   ADL Overall ADL's : Needs assistance/impaired                                             Vision         Perception     Praxis      Pertinent Vitals/Pain Pain Assessment: 0-10 Pain Score: 8  Pain Location: R lateral hip Pain Descriptors / Indicators: Aching;Grimacing Pain Intervention(s): Limited activity within patient's tolerance;Premedicated before session;Monitored during session;Repositioned;Ice applied     Hand Dominance Right   Extremity/Trunk Assessment Upper Extremity Assessment Upper Extremity Assessment: Overall WFL for tasks assessed   Lower Extremity Assessment Lower Extremity Assessment: RLE deficits/detail RLE Deficits / Details: s/p R THA RLE Sensation: WNL LLE Deficits / Details: L LE WNL       Communication Communication Communication: No difficulties   Cognition Arousal/Alertness: Awake/alert Behavior During Therapy: WFL for tasks assessed/performed Overall Cognitive Status: Within Functional Limits for tasks assessed  General Comments: AOx4   General Comments       Exercises Other Exercises Other Exercises: Educ on WB precautions, safety, falls prevention, home modifications, AE for LB dressing/bathing   Shoulder Instructions      Home Living Family/patient expects to be discharged to:: Private residence Living Arrangements: Spouse/significant other Available Help at Discharge: Family;Available PRN/intermittently (husband works nights) Type of Home: House Home Access: Stairs to enter [employer] of Steps:  3 Entrance Stairs-Rails: None Home Layout: One level     Bathroom Shower/Tub: Tub/shower unit;Walk-in shower   Bathroom Toilet: Standard     Home Equipment: Bedside commode;Shower seat;Walker - 2 wheels;Cane - single point   Additional Comments: 6-7 steps w/ hand rail through front door      Prior Functioning/Environment Level of Independence: Needs assistance  Gait / Transfers Assistance Needed: Ambulates w/ SPC intermittently due to R hip pain; min-A sit <> stand from spouse due to hip pain ADL's / Homemaking Assistance Needed: Independent w/ ADLs   Comments: Pt denies fall hx last 6 months        OT Problem List: Decreased strength;Impaired balance (sitting and/or standing);Pain;Decreased range of motion;Decreased activity tolerance;Decreased knowledge of use of DME or AE      OT Treatment/Interventions: Self-care/ADL training;DME and/or AE instruction;Therapeutic activities;Balance training;Energy conservation;Patient/family education;Therapeutic exercise    OT Goals(Current goals can be found in the care plan section) Acute Rehab OT Goals Patient Stated Goal: To get through pain OT Goal Formulation: With patient Time For Goal Achievement: 04/30/21 Potential to Achieve Goals: Good ADL Goals Pt Will Perform Lower Body Dressing: with modified independence Pt Will Transfer to Toilet: with modified independence (using LRAD) Pt Will Perform Toileting - Clothing Manipulation and hygiene: with modified independence;sit to/from stand  OT Frequency: Min 1X/week   Barriers to D/C:            Co-evaluation              AM-PAC OT "6 Clicks" Daily Activity     Outcome Measure Help from another person eating meals?: None Help from another person taking care of personal grooming?: A Little Help from another person toileting, which includes using toliet, bedpan, or urinal?: A Lot Help from another person bathing (including washing, rinsing, drying)?: A Lot Help from  another person to put on and taking off regular upper body clothing?: A Little Help from another person to put on and taking off regular lower body clothing?: A Lot 6 Click Score: 16   End of Session Equipment Utilized During Treatment: Rolling walker  Activity Tolerance: Patient tolerated treatment well Patient left: in bed;with bed alarm set;with call bell/phone within reach  OT Visit Diagnosis: Unsteadiness on feet (R26.81);Other abnormalities of gait and mobility (R26.89);Pain Pain - Right/Left: Right Pain - part of body: Hip                Time: 1128-1140 OT Time Calculation (min): 12 min Charges:  OT General Charges $OT Visit: 1 Visit OT Evaluation $OT Eval Low Complexity: 1 Low OT Treatments $Self Care/Home Management : 8-22 mins Josiah Lobo, PhD, MS, OTR/L 04/16/21, 12:59 PM

## 2021-04-17 LAB — CBC
HCT: 24.5 % — ABNORMAL LOW (ref 36.0–46.0)
Hemoglobin: 7.8 g/dL — ABNORMAL LOW (ref 12.0–15.0)
MCH: 31.1 pg (ref 26.0–34.0)
MCHC: 31.8 g/dL (ref 30.0–36.0)
MCV: 97.6 fL (ref 80.0–100.0)
Platelets: 188 10*3/uL (ref 150–400)
RBC: 2.51 MIL/uL — ABNORMAL LOW (ref 3.87–5.11)
RDW: 12.9 % (ref 11.5–15.5)
WBC: 7.4 10*3/uL (ref 4.0–10.5)
nRBC: 0 % (ref 0.0–0.2)

## 2021-04-17 MED ORDER — HYDROCODONE-ACETAMINOPHEN 5-325 MG PO TABS: 1.0000 | ORAL_TABLET | ORAL | 0 refills | Status: DC | PRN

## 2021-04-17 MED ORDER — ENOXAPARIN SODIUM 40 MG/0.4ML IJ SOSY: 40.0000 mg | PREFILLED_SYRINGE | INTRAMUSCULAR | 0 refills | Status: DC

## 2021-04-17 MED ORDER — METHOCARBAMOL 500 MG PO TABS: 500.0000 mg | ORAL_TABLET | Freq: Four times a day (QID) | ORAL | 0 refills | Status: DC | PRN

## 2021-04-17 MED ORDER — SODIUM CHLORIDE 0.9 % IV BOLUS
1000.0000 mL | Freq: Once | INTRAVENOUS | Status: AC
  Administered 2021-04-17: 1000 mL via INTRAVENOUS

## 2021-04-17 MED ORDER — FE FUMARATE-B12-VIT C-FA-IFC PO CAPS: 1.0000 | ORAL_CAPSULE | Freq: Two times a day (BID) | ORAL | 0 refills | Status: DC

## 2021-04-17 MED ORDER — DOCUSATE SODIUM 100 MG PO CAPS: 100.0000 mg | ORAL_CAPSULE | Freq: Two times a day (BID) | ORAL | 0 refills | Status: DC

## 2021-04-17 MED ORDER — TRAMADOL HCL 50 MG PO TABS: 50.0000 mg | ORAL_TABLET | Freq: Four times a day (QID) | ORAL | 0 refills | Status: DC | PRN

## 2021-04-17 NOTE — Discharge Summary (Signed)
Physician Discharge Summary  Patient ID: Gabrielle Pineda MRN: [id number] DOB/AGE: [birth year] 65 y.o.  Admit date: 04/15/2021 Discharge date: 04/18/2021  Admission Diagnoses:  Status post total hip replacement, right [Z96.641]   Discharge Diagnoses: Patient Active Problem List   Diagnosis Date Noted   Status post total hip replacement, right 04/15/2021   Chronic radicular lumbar pain 10/03/2020   Postlaminectomy syndrome, lumbar region 06/03/2020   Acute non-recurrent pansinusitis 03/12/2020   Moderate episode of recurrent major depressive disorder ([place]) 12/04/2019   Spondylosis of cervical region without myelopathy or radiculopathy 09/21/2019   DDD (degenerative disc disease), cervical 09/21/2019   Cervicalgia 09/21/2019   Cervical fusion syndrome 09/21/2019   Chronic pain syndrome 09/21/2019   Spinal stenosis, lumbar region, with neurogenic claudication 08/15/2019   History of adenomatous polyp of colon 11/04/2017   Cervical radiculopathy 08/02/2017   Neuropathy 08/02/2017   Essential hypertension 07/28/2017   Closed compression fracture of L5 lumbar vertebra 07/07/2017   Sacral insufficiency fracture with routine healing 07/07/2017   COPD (chronic obstructive pulmonary disease) ([place]) 04/10/2015   Family history of malignant neoplasm of pancreas 04/03/2015   Hypercholesteremia 04/03/2015   Disorder of iron metabolism 04/03/2015   Weight loss 04/03/2015   Delayed onset of urination 04/03/2015   Acid reflux 10/11/2014   Closed fracture of distal phalanx of thumb 08/15/2014   Arthritis, degenerative 01/30/2014   Arthritis or polyarthritis, rheumatoid ([place]) 01/30/2014   Shortness of breath 12/22/2013   SMOKER 09/22/2010   Paroxysmal supraventricular tachycardia ([place] ) 09/22/2010   CHEST PAIN UNSPECIFIED 09/22/2010   ELECTROCARDIOGRAM, ABNORMAL 09/22/2010   B-complex deficiency 09/09/2007   CN (constipation) 06/26/2007   Clinical depression 06/26/2007   Cold sore  06/26/2007   H/O alcohol abuse 06/26/2007   Cannot sleep 06/26/2007   Localized osteoarthrosis, hand 06/26/2007   Menopausal symptom 06/26/2007    Past Medical History:  Diagnosis Date   Alcohol abuse    Anemia    Anxiety    Arthritis    Cervicalgia    Cirrhosis ([place]) 1994   COPD (chronic obstructive pulmonary disease) ([place])    Depression    Difficult intubation    has plates and screws in neck   Dyspnea    GERD (gastroesophageal reflux disease)    Headache    Heart murmur    on heard when pt is lying   Hypertension    Other and unspecified hyperlipidemia    Status post insertion of spinal cord stimulator    Tachycardia    d/t questionable anxiety happens every 5- 10 years, sees [facility]     Transfusion: none   Consultants (if any):   Discharged Condition: Improved  Hospital Course: Gabrielle Pineda is an 65 y.o. female who was admitted 04/15/2021 with a diagnosis of right hip osteoarthritis and went to the operating room on 04/15/2021 and underwent the above named procedures.    Surgeries: Procedure(s): TOTAL HIP ARTHROPLASTY ANTERIOR APPROACH on 04/15/2021 Patient tolerated the surgery well. Taken to PACU where she was stabilized and then transferred to the orthopedic floor.  Started on Lovenox 40 mg q 24 hrs. Foot pumps applied bilaterally at 80 mm. Heels elevated on bed with rolled towels. No evidence of DVT. Negative [place]. Physical therapy started on day #1 for gait training and transfer. OT started day #1 for ADL and assisted devices.  Patient's foley was d/c on day #1. Patient's IV was d/c on day #2.  On post op day #3 patient was stable and ready for  discharge to home with HHPT.    She was given perioperative antibiotics:  Anti-infectives (From admission, onward)    Start     Dose/Rate Route Frequency Ordered Stop   04/15/21 1600  ceFAZolin (ANCEF) IVPB 2g/100 mL premix        2 g 200 mL/hr over 30 Minutes Intravenous Every 6 hours 04/15/21  1242 04/15/21 2122   04/15/21 1319  valACYclovir (VALTREX) tablet 2,000 mg       Note to Pharmacy: OP [id number] 2 TABLETS BY MOUTH TWICE DAILY AS NEEDED FOR 5 DAYS Patient taking differently: Take 2 g twice daily for 5 days as needed for fever blisters     2,000 mg Oral 2 times daily PRN 04/15/21 1242 04/20/21 1318   04/15/21 0733  ceFAZolin (ANCEF) 2-4 GM/100ML-% IVPB       Note to Pharmacy: [place] [facility], [place]: cabinet override      04/15/21 0733 04/15/21 0936   04/15/21 0600  ceFAZolin (ANCEF) IVPB 2g/100 mL premix        2 g 200 mL/hr over 30 Minutes Intravenous On call to O.R. 04/14/21 2334 04/15/21 [id number]     .  She was given sequential compression devices, early ambulation, and Lovenox TEDs for DVT prophylaxis.  She benefited maximally from the hospital stay and there were no complications.    Recent vital signs:  Vitals:   04/18/21 0801 04/18/21 1003  BP: 108/72 101/63  Pulse: 84 83  Resp: 15   Temp: 97.8 F (36.6 C)   SpO2: 97%     Recent laboratory studies:  Lab Results  Component Value Date   HGB 8.0 (L) 04/18/2021   HGB 7.8 (L) 04/17/2021   HGB 8.8 (L) 04/16/2021   Lab Results  Component Value Date   WBC 7.4 04/18/2021   PLT 181 04/18/2021   No results found for: INR Lab Results  Component Value Date   NA 139 04/16/2021   K 3.5 04/16/2021   CL 111 04/16/2021   CO2 23 04/16/2021   BUN 12 04/16/2021   CREATININE 0.99 04/16/2021   GLUCOSE 134 (H) 04/16/2021    Discharge Medications:   Allergies as of 04/18/2021       Reactions   Plaquenil [hydroxychloroquine] Rash        Medication List     STOP taking these medications    ibuprofen 200 MG tablet Commonly known as: ADVIL   meloxicam 15 MG tablet Commonly known as: MOBIC       TAKE these medications    acyclovir ointment 5 % Commonly known as: Zovirax Apply 1 application topically every 3 (three) hours. What changed:  when to take this reasons to take this   ARIPiprazole 10 MG  tablet Commonly known as: ABILIFY Take 1 tablet (10 mg total) by mouth daily.   atorvastatin 40 MG tablet Commonly known as: LIPITOR Take 1 tablet (40 mg total) by mouth daily.   busPIRone 30 MG tablet Commonly known as: BUSPAR Take 1 tablet (30 mg total) by mouth 3 (three) times daily.   carvedilol 12.5 MG tablet Commonly known as: COREG Take 1 tablet (12.5 mg total) by mouth 2 (two) times daily with a meal.   docusate sodium 100 MG capsule Commonly known as: COLACE Take 1 capsule (100 mg total) by mouth 2 (two) times daily.   enoxaparin 40 MG/0.4ML injection Commonly known as: LOVENOX Inject 0.4 mLs (40 mg total) into the skin daily for 14 days.   ESTROVEN ENERGY PO  Take 1 tablet by mouth daily.   Eszopiclone 3 MG Tabs Take 1 tablet (3 mg total) by mouth at bedtime. Take immediately before bedtime   ezetimibe 10 MG tablet Commonly known as: ZETIA Take 1 tablet (10 mg total) by mouth daily.   ferrous [id number] C-folic acid capsule Commonly known as: TRINSICON / FOLTRIN Take 1 capsule by mouth 2 (two) times daily after a meal.   fluticasone 50 MCG/ACT nasal spray Commonly known as: FLONASE Place 2 sprays into both nostrils daily. What changed:  when to take this reasons to take this   furosemide 20 MG tablet Commonly known as: LASIX TAKE 1 TABLET BY MOUTH ONCE DAILY AS NEEDED FOR FLUID What changed:  how much to take how to take this when to take this reasons to take this additional instructions   gabapentin 600 MG tablet Commonly known as: NEURONTIN Take 1 tablet (600 mg total) by mouth 3 (three) times daily.   Guaifenesin 1200 MG Tb12 Take 1,200 mg by mouth daily as needed (congestion).   HYDROcodone-acetaminophen 5-325 MG tablet Commonly known as: NORCO/VICODIN Take 1-2 tablets by mouth every 4 (four) hours as needed for moderate pain (pain score 4-6).   ipratropium 0.02 % nebulizer solution Commonly known as: ATROVENT Take 2.5 mLs  (0.5 mg total) by nebulization 4 (four) times daily as needed for wheezing or shortness of breath.   leflunomide 10 MG tablet Commonly known as: ARAVA Take 10 mg by mouth at bedtime.   Linzess 145 MCG Caps capsule Generic drug: linaclotide Take 145 mcg by mouth daily before breakfast.   methocarbamol 500 MG tablet Commonly known as: ROBAXIN Take 1 tablet (500 mg total) by mouth every 6 (six) hours as needed for muscle spasms.   multivitamin with minerals Tabs tablet Take 1 tablet by mouth daily.   omeprazole 40 MG capsule Commonly known as: PRILOSEC Take 1 capsule (40 mg total) by mouth 2 (two) times daily.   potassium chloride SA 20 MEQ tablet Commonly known as: KLOR-CON Take 1 tablet (20 mEq total) by mouth daily with breakfast.   sertraline 50 MG tablet Commonly known as: ZOLOFT TAKE ONE TABLET BY MOUTH EVERYDAY AT BEDTIME What changed: See the new instructions.   topiramate 25 MG tablet Commonly known as: Topamax Take 1 tablet (25 mg total) by mouth 2 (two) times daily.   traMADol 50 MG tablet Commonly known as: ULTRAM Take 1 tablet (50 mg total) by mouth every 6 (six) hours as needed. What changed:  how much to take reasons to take this   Trelegy Ellipta 100-62.5-25 MCG/INH Aepb Generic drug: Fluticasone-Umeclidin-Vilant Inhale 1 puff into the lungs daily.   valACYclovir 1000 MG tablet Commonly known as: VALTREX TAKE 2 TABLETS BY MOUTH TWICE DAILY AS NEEDED FOR 5 DAYS What changed:  how much to take how to take this when to take this additional instructions   venlafaxine XR 75 MG 24 hr capsule Commonly known as: EFFEXOR-XR TAKE 3 CAPSULES BY MOUTH ONCE DAILY WITH BREAKFAST What changed:  how much to take how to take this when to take this additional instructions   vitamin B-12 1000 MCG tablet Commonly known as: CYANOCOBALAMIN Take 6,000 mcg by mouth daily.   vitamin E 180 MG (400 UNITS) capsule Take 1,600 Units by mouth daily.   Xiidra 5 %  Soln Generic drug: Lifitegrast Place 1 drop into both eyes daily.               Durable Medical Equipment  (  From admission, onward)           Start     Ordered   04/15/21 1243  DME Walker rolling  Once       Question Answer Comment  Walker: With 5 Inch Wheels   Patient needs a walker to treat with the following condition Status post total hip replacement, right      04/15/21 1242   04/15/21 1243  DME 3 n 1  Once        04/15/21 1242   04/15/21 1243  DME Bedside commode  Once       Question:  Patient needs a bedside commode to treat with the following condition  Answer:  Status post total hip replacement, right   04/15/21 1242            Diagnostic Studies: DG HIP OPERATIVE UNILAT W OR W/O PELVIS RIGHT  Result Date: 04/15/2021 CLINICAL DATA:  Right hip replacement EXAM: OPERATIVE RIGHT HIP WITH PELVIS COMPARISON:  None. FLUOROSCOPY TIME:  Radiation Exposure Index (as provided by the fluoroscopic device): 4.1 mGy If the device does not provide the exposure index: Fluoroscopy Time:  12 seconds Number of Acquired Images:  4 FINDINGS: Right hip prosthesis is noted in satisfactory position. No acute bony or soft tissue abnormality is noted. IMPRESSION: Status post right hip replacement. Electronically Signed   By: Inez Catalina M.D.   On: 04/15/2021 12:52   DG HIP UNILAT W OR W/O PELVIS 2-3 VIEWS RIGHT  Result Date: 04/15/2021 CLINICAL DATA:  Postop right hip replacement EXAM: DG HIP (WITH OR WITHOUT PELVIS) 2-3V RIGHT COMPARISON:  Inferior operative images same day FINDINGS: Postsurgical changes of right hip arthroplasty. Hardware is in normal alignment without evidence of loosening or periprosthetic fracture. Expected soft tissue changes. There is a stimulator generator overlying the right flank. IMPRESSION: Right hip arthroplasty without evidence of immediate hardware complication. Electronically Signed   By: Maurine Simmering M.D.   On: 04/15/2021 12:04    Disposition: Discharge  disposition: 06-Home-Health Care Svc         Follow-up Information     Duanne Guess, PA-C Follow up in 2 week(s).   Specialties: Orthopedic Surgery, Emergency Medicine Contact information: [place] [address] [phone]                  Signed: Feliberto Gottron 04/18/2021, 11:52 AM

## 2021-04-17 NOTE — Progress Notes (Addendum)
Patient hypotensive (see flowsheet). BP meds held. Gabrielle Pineda, [place] notified. See Orders. Bolus started on patient. NP states its ok to continue BP meds now.

## 2021-04-17 NOTE — Progress Notes (Signed)
Physical Therapy Treatment Patient Details Name: Gabrielle Pineda MRN: [id number] DOB: 1956/03/26 Today's Date: 04/17/2021    History of Present Illness Pt is a 65 y.o. F s/p R THA on 04/15/21. PMH includes COPD, HTN, anemia, C5-C6 disc surgery.    PT Comments    Pt alert, seated in recliner, indicates 7/10 sx for lightheadedness. Vitals assessed again this session, intermittent hypotension with pt being consistently symptomatic, ambulation deferred but pt able to perform seated therapeutic exercises EOB. Transfers required min-A x 1 after being seated in recliner for majority of the morning, Min-guard x 1 2nd sit <> stand. Pt requires supervision for bed mobility, able to boost, scoot, and manage RLE without PT assist. Skilled PT intervention is indicated to address deficits in function, mobility, and to return to PLOF as able.  Discharge recommendations remain HHPT.  Vitals  (sit) BP: 99/57, 82, 99% > (stand) 93/57 > (sit) 90s/60s > (supine) 87/61, 82, 100% > (supine HOB elevated) 97/53, 81, 96%   Follow Up Recommendations  Home health PT;Supervision for mobility/OOB     Equipment Recommendations  None recommended by PT    Recommendations for Other Services       Precautions / Restrictions Precautions Precautions: Anterior Hip;Fall Restrictions Weight Bearing Restrictions: Yes RLE Weight Bearing: Weight bearing as tolerated    Mobility  Bed Mobility Overal bed mobility: Needs Assistance         Sit to supine: Supervision   General bed mobility comments: Increased time, bed rails    Transfers Overall transfer level: Needs assistance Equipment used: Rolling walker (2 wheeled) Transfers: Sit to/from Stand Sit to Stand: Min guard;Min assist Stand pivot transfers: Min guard       General transfer comment: min-A x 1 after sitting in recliner throughout AM, Min-gaurd x 1,  Ambulation/Gait Ambulation/Gait assistance: Min guard Gait Distance (Feet): 2  Feet Assistive device: Rolling walker (2 wheeled) Gait Pattern/deviations: Step-through pattern;Trunk flexed     General Gait Details: Recliner > bed, steps forward, back, min-gaurd for cues, minimal RW guidance   Stairs             Wheelchair Mobility    Modified Rankin (Stroke Patients Only)       Balance Overall balance assessment: Needs assistance Sitting-balance support: Feet supported;Single extremity supported Sitting balance-Leahy Scale: Good     Standing balance support: Bilateral upper extremity supported;During functional activity Standing balance-Leahy Scale: Fair Standing balance comment: Requires BUE support, min-gaurd                            Cognition Arousal/Alertness: Awake/alert Behavior During Therapy: WFL for tasks assessed/performed Overall Cognitive Status: Within Functional Limits for tasks assessed                                 General Comments: Fatigued      Exercises Total Joint Exercises Ankle Circles/Pumps: AROM;Both;15 reps;Seated Gluteal Sets: AROM;Both;15 reps;Seated Hip ABduction/ADduction: AROM;10 reps;Seated;Both Long Arc Quad: AROM;10 reps;Both;Seated Other Exercises Other Exercises: Performed orthostatic testing due to 7/10 sx lightheadedness while seated: (sit) BP: 99/57, 82, 99% > (stand) 93/57 > (sit) 90s/60s > (supine) 87/61, 82, 100% > (supine HOB elevated) 97/53, 81, 96%    General Comments        Pertinent Vitals/Pain Pain Assessment: 0-10 Pain Score: 7  Pain Location: R lateral hip, quad Pain Descriptors / Indicators: Aching;Grimacing Pain Intervention(s):  Limited activity within patient's tolerance;Monitored during session;Repositioned    Home Living                      Prior Function            PT Goals (current goals can now be found in the care plan section) Progress towards PT goals: Progressing toward goals    Frequency    BID      PT Plan Current  plan remains appropriate    Co-evaluation              AM-PAC PT "6 Clicks" Mobility   Outcome Measure  Help needed turning from your back to your side while in a flat bed without using bedrails?: None Help needed moving from lying on your back to sitting on the side of a flat bed without using bedrails?: A Little Help needed moving to and from a bed to a chair (including a wheelchair)?: A Little Help needed standing up from a chair using your arms (e.g., wheelchair or bedside chair)?: A Little Help needed to walk in hospital room?: A Little Help needed climbing 3-5 steps with a railing? : A Little 6 Click Score: 19    End of Session Equipment Utilized During Treatment: Gait belt Activity Tolerance: Other (comment) (Pt limited by sx of hypotension) Patient left: in bed;with SCD's reapplied;with call bell/phone within reach;with bed alarm set Nurse Communication: Mobility status;Other (comment) (vitals) PT Visit Diagnosis: Unsteadiness on feet (R26.81);Muscle weakness (generalized) (M62.81)     Time: 1332-1410 PT Time Calculation (min) (ACUTE ONLY): 38 min  Charges:  $Therapeutic Activity: 23-37 mins                     [employer], SPT

## 2021-04-17 NOTE — Progress Notes (Signed)
   Subjective: 2 Days Post-Op Procedure(s) (LRB): TOTAL HIP ARTHROPLASTY ANTERIOR APPROACH (Right) Patient reports pain as mild.   Patient is well, and has had no acute complaints or problems Denies any CP, SOB, ABD pain. We will continue therapy today.  Plan is to go Home after hospital stay.  Objective: Vital signs in last 24 hours: Temp:  [98.4 F (36.9 C)-99.9 F (37.7 C)] 98.4 F (36.9 C) (09/01 0816) Pulse Rate:  [89-97] 97 (09/01 0816) Resp:  [16-20] 16 (09/01 0816) BP: (107-108)/(55-57) 108/57 (09/01 0816) SpO2:  [95 %-96 %] 95 % (09/01 0816)  Intake/Output from previous day: 08/31 0701 - 09/01 0700 In: 480 [P.O.:480] Out: -  Intake/Output this shift: No intake/output data recorded.  Recent Labs    04/15/21 1252 04/16/21 0426 04/17/21 0420  HGB 10.3* 8.8* 7.8*   Recent Labs    04/16/21 0426 04/17/21 0420  WBC 9.9 7.4  RBC 2.86* 2.51*  HCT 28.0* 24.5*  PLT 206 188   Recent Labs    04/15/21 1252 04/16/21 0426  NA  --  139  K  --  3.5  CL  --  111  CO2  --  23  BUN  --  12  CREATININE 0.95 0.99  GLUCOSE  --  134*  CALCIUM  --  8.3*   No results for input(s): LABPT, INR in the last 72 hours.  EXAM General - Patient is Alert, Appropriate, and Oriented Extremity - Neurovascular intact Sensation intact distally Intact pulses distally Dorsiflexion/Plantar flexion intact Dressing - dressing C/D/I and no drainage, scant drainage in prevena system Motor Function - intact, moving foot and toes well on exam.   Past Medical History:  Diagnosis Date   Alcohol abuse    Anemia    Anxiety    Arthritis    Cervicalgia    Cirrhosis ([place]) 1994   COPD (chronic obstructive pulmonary disease) ([place])    Depression    Difficult intubation    has plates and screws in neck   Dyspnea    GERD (gastroesophageal reflux disease)    Headache    Heart murmur    on heard when pt is lying   Hypertension    Other and unspecified hyperlipidemia    Status post  insertion of spinal cord stimulator    Tachycardia    d/t questionable anxiety happens every 5- 10 years, sees [facility]    Assessment/Plan:   2 Days Post-Op Procedure(s) (LRB): TOTAL HIP ARTHROPLASTY ANTERIOR APPROACH (Right) Active Problems:   Status post total hip replacement, right  Estimated body mass index is 24.29 kg/m as calculated from the following:   Height as of 04/04/21: '5\' 9"'$  (1.753 m).   Weight as of 04/04/21: 74.6 kg. Advance diet Up with therapy Pain well controlled VSS Acute post op blood loss anemia - Hgb 7.8, continue with Iron supplement CM to assist with discharge to home with HHPT today pending completion of PT goals   DVT Prophylaxis - Lovenox, Foot Pumps, and TED hose Weight-Bearing as tolerated to right leg   T. Rachelle Hora, PA-C [place] 04/17/2021, 8:50 AM

## 2021-04-17 NOTE — Discharge Instructions (Signed)

## 2021-04-17 NOTE — Progress Notes (Signed)
Physical Therapy Treatment Patient Details Name: Gabrielle Pineda MRN: [id number] DOB: [birth year] Today's Date: 04/17/2021    History of Present Illness Pt is a 65 y.o. F s/p R THA on 04/15/21. PMH includes COPD, HTN, anemia, C5-C6 disc surgery.    PT Comments    Pt alert, up with nursing to [facility]. Pt notes 7/10 pain at rest and increased fatigue compared to previous session. Vitals assessed due to change in symptoms, borderline orthostatic hypotensive, high level mobility deferred. Nursing notified of sx. Pt performed sit <> stand x 4 w/ min-guard, RW. Stand pivot <> recliner w/ RW, min-guard. PT assessed wound, no increased drainage noted today. Skilled PT intervention is indicated to address deficits in function, mobility, and to return to PLOF as able.  Discharge plan remains HHPT.   Vitals: (sit) BP: 99/65, HR: 91, 97% > (stand) BP: 84/60, HR:99, 98% > (sit) BP: 101/63 > (stand) 83/70 > (stand + 2 min) 96/63 > (seated, post-session): 100/66, HR:91, 98%    Follow Up Recommendations  Home health PT;Supervision for mobility/OOB     Equipment Recommendations  None recommended by PT    Recommendations for Other Services       Precautions / Restrictions Precautions Precautions: Anterior Hip;Fall Restrictions Weight Bearing Restrictions: Yes RLE Weight Bearing: Weight bearing as tolerated    Mobility  Bed Mobility               General bed mobility comments: Pt seated on BSC w/ nursing    Transfers Overall transfer level: Needs assistance Equipment used: Rolling walker (2 wheeled) Transfers: Sit to/from [facility] Sit to Stand: Min guard Stand pivot transfers: Min guard          Ambulation/Gait             General Gait Details: Not assessed due to symptomatic hypotension   Stairs             Wheelchair Mobility    Modified Rankin (Stroke Patients Only)       Balance Overall balance assessment: Needs  assistance Sitting-balance support: Feet supported;No upper extremity supported Sitting balance-Leahy Scale: Good     Standing balance support: Bilateral upper extremity supported;During functional activity Standing balance-Leahy Scale: Fair Standing balance comment: Requires BUE support                            Cognition Arousal/Alertness: Awake/alert Behavior During Therapy: WFL for tasks assessed/performed Overall Cognitive Status: Within Functional Limits for tasks assessed                                        Exercises Total Joint Exercises Ankle Circles/Pumps: AROM;Both;15 reps;Seated Other Exercises Other Exercises: Performed orthostatic testing: (sit) BP: 99/65, HR: 91, 97% > (stand) BP: 84/60, HR:99, 98% > (sit) BP: 101/63 > (stand) 83/70 > (stand + 2 min) 96/63 > (seated, post-session): 100/66, HR:91, 98%    General Comments        Pertinent Vitals/Pain Pain Assessment: 0-10 Pain Score: 7  Pain Location: R lateral hip, quad Pain Descriptors / Indicators: Aching;Grimacing Pain Intervention(s): Limited activity within patient's tolerance;Monitored during session;Repositioned;Patient requesting pain meds-RN notified    Home Living                      Prior Function  PT Goals (current goals can now be found in the care plan section) Progress towards PT goals: Progressing toward goals    Frequency    BID      PT Plan Current plan remains appropriate    Co-evaluation              AM-PAC PT "6 Clicks" Mobility   Outcome Measure  Help needed turning from your back to your side while in a flat bed without using bedrails?: None Help needed moving from lying on your back to sitting on the side of a flat bed without using bedrails?: A Little Help needed moving to and from a bed to a chair (including a wheelchair)?: A Little Help needed standing up from a chair using your arms (e.g., wheelchair or  bedside chair)?: A Little Help needed to walk in hospital room?: A Little Help needed climbing 3-5 steps with a railing? : A Little 6 Click Score: 19    End of Session Equipment Utilized During Treatment: Gait belt Activity Tolerance: Patient tolerated treatment well Patient left: with call bell/phone within reach;with SCD's reapplied;with family/visitor present;in chair;with chair alarm set Nurse Communication: Mobility status;Other (comment) (Vitals, pain meds) PT Visit Diagnosis: Unsteadiness on feet (R26.81);Muscle weakness (generalized) (M62.81)     Time: [id number] PT Time Calculation (min) (ACUTE ONLY): 35 min  Charges:                        [employer], SPT

## 2021-04-18 LAB — CBC
HCT: 25.1 % — ABNORMAL LOW (ref 36.0–46.0)
Hemoglobin: 8 g/dL — ABNORMAL LOW (ref 12.0–15.0)
MCH: 32 pg (ref 26.0–34.0)
MCHC: 31.9 g/dL (ref 30.0–36.0)
MCV: 100.4 fL — ABNORMAL HIGH (ref 80.0–100.0)
Platelets: 181 10*3/uL (ref 150–400)
RBC: 2.5 MIL/uL — ABNORMAL LOW (ref 3.87–5.11)
RDW: 13 % (ref 11.5–15.5)
WBC: 7.4 10*3/uL (ref 4.0–10.5)
nRBC: 0 % (ref 0.0–0.2)

## 2021-04-18 NOTE — Care Management Important Message (Signed)
Important Message  Patient Details  Name: Gabrielle Pineda MRN: [id number] Date of Birth: [birth year]   Medicare Important Message Given:  Yes     Dannette Barbara 04/18/2021, 11:54 AM

## 2021-04-18 NOTE — Progress Notes (Addendum)
Patient discharged home via personal vehicle. IV removed. Wound vac changed over. All belongings sent with patient. Patient has equipment at home. Discharge went over with opportunity to ask questions. Patient states her appointment is already set up for patient to follow up with ortho in 2 weeks.

## 2021-04-18 NOTE — Progress Notes (Addendum)
   Subjective: 3 Days Post-Op Procedure(s) (LRB): TOTAL HIP ARTHROPLASTY ANTERIOR APPROACH (Right) Patient reports pain as mild.   Patient is well, and has had no acute complaints or problems.  Yesterday felt a little dizzy and lightheaded, blood pressure was low.  Today feeling much better, able to get up and go to the bathroom with no symptoms. Denies any CP, SOB, ABD pain. We will continue therapy today.  Plan is to go Home after hospital stay.  Objective: Vital signs in last 24 hours: Temp:  [97.8 F (36.6 C)-98.7 F (37.1 C)] 97.8 F (36.6 C) (09/02 0801) Pulse Rate:  [77-97] 84 (09/02 0801) Resp:  [15-18] 15 (09/02 0801) BP: (89-110)/(48-75) 108/72 (09/02 0801) SpO2:  [94 %-100 %] 97 % (09/02 0801)  Intake/Output from previous day: 09/01 0701 - 09/02 0700 In: 1764.9 [I.V.:1764.9] Out: -  Intake/Output this shift: No intake/output data recorded.  Recent Labs    04/15/21 1252 04/16/21 0426 04/17/21 0420  HGB 10.3* 8.8* 7.8*   Recent Labs    04/16/21 0426 04/17/21 0420  WBC 9.9 7.4  RBC 2.86* 2.51*  HCT 28.0* 24.5*  PLT 206 188   Recent Labs    04/15/21 1252 04/16/21 0426  NA  --  139  K  --  3.5  CL  --  111  CO2  --  23  BUN  --  12  CREATININE 0.95 0.99  GLUCOSE  --  134*  CALCIUM  --  8.3*   No results for input(s): LABPT, INR in the last 72 hours.  EXAM General - Patient is Alert, Appropriate, and Oriented Extremity - Neurovascular intact Sensation intact distally Intact pulses distally Dorsiflexion/Plantar flexion intact Dressing - dressing C/D/I and no drainage, scant drainage in prevena system Motor Function - intact, moving foot and toes well on exam.   Past Medical History:  Diagnosis Date   Alcohol abuse    Anemia    Anxiety    Arthritis    Cervicalgia    Cirrhosis ([place]) 1994   COPD (chronic obstructive pulmonary disease) ([place])    Depression    Difficult intubation    has plates and screws in neck   Dyspnea    GERD  (gastroesophageal reflux disease)    Headache    Heart murmur    on heard when pt is lying   Hypertension    Other and unspecified hyperlipidemia    Status post insertion of spinal cord stimulator    Tachycardia    d/t questionable anxiety happens every 5- 10 years, sees [facility]    Assessment/Plan:   3 Days Post-Op Procedure(s) (LRB): TOTAL HIP ARTHROPLASTY ANTERIOR APPROACH (Right) Active Problems:   Status post total hip replacement, right  Estimated body mass index is 24.29 kg/m as calculated from the following:   Height as of 04/04/21: '5\' 9"'$  (1.753 m).   Weight as of 04/04/21: 74.6 kg. Advance diet Up with therapy Pain well controlled VSS Acute post op blood loss anemia - Hgb 7.8, continue with Iron supplement.  Morning labs pending CM to assist with discharge to home with HHPT today pending completion of PT goals   DVT Prophylaxis - Lovenox, Foot Pumps, and TED hose Weight-Bearing as tolerated to right leg   T. Rachelle Hora, PA-C [place] 04/18/2021, 8:13 AM

## 2021-04-18 NOTE — Progress Notes (Signed)
Physical Therapy Treatment Patient Details Name: Gabrielle Pineda MRN: [id number] DOB: 1955-08-22 Today's Date: 04/18/2021    History of Present Illness Pt is a 65 y.o. F s/p R THA on 04/15/21. PMH includes COPD, HTN, anemia, C5-C6 disc surgery.    PT Comments    Assessed vitals this session, medical team notified. Pt denies lightheadedness before, during, after treatment. Ambulated w/ RW, min-guard, x 2 rest breaks due to SOB. Pt demonstrates improved weight shift and RW technique. Transfers w/ RW, min-guard, no reminders necessary for hand placement this session. Pt education provided on graded activity if symptomatic or SOB. Skilled PT intervention is indicated to address deficits in function, mobility, and to return to PLOF as able.  Discharge recommendations are HHPT.   Vitals (Seated) BP: 109/48, HR: 82, (standing) BP: 103/73, HR: 88, (post-activity, seated): BP: 102/54, HR: 80  Follow Up Recommendations  Home health PT;Supervision for mobility/OOB     Equipment Recommendations  None recommended by PT    Recommendations for Other Services       Precautions / Restrictions Precautions Precautions: Anterior Hip;Fall Restrictions Weight Bearing Restrictions: Yes RLE Weight Bearing: Weight bearing as tolerated    Mobility  Bed Mobility Overal bed mobility: Needs Assistance Bed Mobility: Supine to Sit     Supine to sit: [facility] elevated;Min assist     General bed mobility comments: To assist RLE off bed    Transfers Overall transfer level: Needs assistance Equipment used: Rolling walker (2 wheeled) Transfers: Sit to/from Stand Sit to Stand: Min guard            Ambulation/Gait Ambulation/Gait assistance: Min guard Gait Distance (Feet): 100 Feet Assistive device: Rolling walker (2 wheeled) Gait Pattern/deviations: Step-through pattern;Trunk flexed     General Gait Details: Improved weight shifting   Stairs             Wheelchair Mobility     Modified Rankin (Stroke Patients Only)       Balance Overall balance assessment: Needs assistance Sitting-balance support: Feet supported;Single extremity supported Sitting balance-Leahy Scale: Good     Standing balance support: Bilateral upper extremity supported;During functional activity Standing balance-Leahy Scale: Fair                              Chief Technology Officer Exercises Long Arc Quad: AROM;10 reps;Both;Seated Other Exercises Other Exercises: Bed <> BSC <> recliner transfer, min-gaurd, RW, pt able to perform pericare without assistance    General Comments        Pertinent Vitals/Pain Pain Assessment: 0-10 Pain Score: 7  Pain Location: R lateral hip, quad Pain Descriptors / Indicators: Aching;Grimacing Pain Intervention(s): Limited activity within patient's tolerance;Monitored during session;Repositioned;Relaxation    Home Living                      Prior Function            PT Goals (current goals can now be found in the care plan section) Progress towards PT goals: Progressing toward goals    Frequency    BID      PT Plan Current plan remains appropriate    Co-evaluation  AM-PAC PT "6 Clicks" Mobility   Outcome Measure  Help needed turning from your back to your side while in a flat bed without using bedrails?: None Help needed moving from lying on your back to sitting on the side of a flat bed without using bedrails?: A Little Help needed moving to and from a bed to a chair (including a wheelchair)?: A Little Help needed standing up from a chair using your arms (e.g., wheelchair or bedside chair)?: A Little Help needed to walk in hospital room?: A Little Help needed climbing 3-5 steps with a railing? : A Little 6 Click Score: 19    End of Session Equipment Utilized During Treatment: Gait belt Activity Tolerance:  Patient tolerated treatment well Patient left: in chair;with SCD's reapplied;with call bell/phone within reach;with chair alarm set Nurse Communication: Mobility status (vitals) PT Visit Diagnosis: Unsteadiness on feet (R26.81);Muscle weakness (generalized) (M62.81)     Time: [id number] PT Time Calculation (min) (ACUTE ONLY): 50 min  Charges:                        [employer], SPT

## 2021-04-18 NOTE — Progress Notes (Signed)
Patient slept well. No complaints voiced. Medicated as per orders with positive effect. Will continue to monitor.

## 2021-04-18 NOTE — Plan of Care (Signed)

## 2021-04-19 DIAGNOSIS — Z96642 Presence of left artificial hip joint: Secondary | ICD-10-CM | POA: Diagnosis not present

## 2021-04-19 DIAGNOSIS — M48062 Spinal stenosis, lumbar region with neurogenic claudication: Secondary | ICD-10-CM | POA: Diagnosis not present

## 2021-04-19 DIAGNOSIS — D649 Anemia, unspecified: Secondary | ICD-10-CM | POA: Diagnosis not present

## 2021-04-19 DIAGNOSIS — Z471 Aftercare following joint replacement surgery: Secondary | ICD-10-CM | POA: Diagnosis not present

## 2021-04-19 DIAGNOSIS — E7800 Pure hypercholesterolemia, unspecified: Secondary | ICD-10-CM | POA: Diagnosis not present

## 2021-04-19 DIAGNOSIS — I10 Essential (primary) hypertension: Secondary | ICD-10-CM | POA: Diagnosis not present

## 2021-04-19 DIAGNOSIS — K5909 Other constipation: Secondary | ICD-10-CM | POA: Diagnosis not present

## 2021-04-19 DIAGNOSIS — M4722 Other spondylosis with radiculopathy, cervical region: Secondary | ICD-10-CM | POA: Diagnosis not present

## 2021-04-19 DIAGNOSIS — M5416 Radiculopathy, lumbar region: Secondary | ICD-10-CM | POA: Diagnosis not present

## 2021-04-19 DIAGNOSIS — R634 Abnormal weight loss: Secondary | ICD-10-CM | POA: Diagnosis not present

## 2021-04-19 DIAGNOSIS — Z7951 Long term (current) use of inhaled steroids: Secondary | ICD-10-CM | POA: Diagnosis not present

## 2021-04-19 DIAGNOSIS — J449 Chronic obstructive pulmonary disease, unspecified: Secondary | ICD-10-CM | POA: Diagnosis not present

## 2021-04-19 DIAGNOSIS — F331 Major depressive disorder, recurrent, moderate: Secondary | ICD-10-CM | POA: Diagnosis not present

## 2021-04-19 DIAGNOSIS — F419 Anxiety disorder, unspecified: Secondary | ICD-10-CM | POA: Diagnosis not present

## 2021-04-19 DIAGNOSIS — Z6830 Body mass index (BMI) 30.0-30.9, adult: Secondary | ICD-10-CM | POA: Diagnosis not present

## 2021-04-19 DIAGNOSIS — G629 Polyneuropathy, unspecified: Secondary | ICD-10-CM | POA: Diagnosis not present

## 2021-04-19 DIAGNOSIS — G894 Chronic pain syndrome: Secondary | ICD-10-CM | POA: Diagnosis not present

## 2021-04-19 DIAGNOSIS — M069 Rheumatoid arthritis, unspecified: Secondary | ICD-10-CM | POA: Diagnosis not present

## 2021-04-19 DIAGNOSIS — K219 Gastro-esophageal reflux disease without esophagitis: Secondary | ICD-10-CM | POA: Diagnosis not present

## 2021-04-19 DIAGNOSIS — M5010 Cervical disc disorder with radiculopathy, unspecified cervical region: Secondary | ICD-10-CM | POA: Diagnosis not present

## 2021-04-19 DIAGNOSIS — Z7901 Long term (current) use of anticoagulants: Secondary | ICD-10-CM | POA: Diagnosis not present

## 2021-04-19 DIAGNOSIS — K7460 Unspecified cirrhosis of liver: Secondary | ICD-10-CM | POA: Diagnosis not present

## 2021-04-22 ENCOUNTER — Encounter: Payer: Self-pay | Admitting: Orthopedic Surgery

## 2021-04-22 MED ORDER — HEMOSTATIC AGENTS (NO CHARGE) OPTIME
TOPICAL | Status: DC | PRN
  Administered 2021-04-15: 2 via TOPICAL

## 2021-04-23 ENCOUNTER — Telehealth: Payer: Self-pay

## 2021-04-23 DIAGNOSIS — K219 Gastro-esophageal reflux disease without esophagitis: Secondary | ICD-10-CM

## 2021-04-23 DIAGNOSIS — F331 Major depressive disorder, recurrent, moderate: Secondary | ICD-10-CM

## 2021-04-23 NOTE — Progress Notes (Signed)
Chronic Care Management Pharmacy Assistant   Name: Gabrielle Pineda  MRN: [id number] DOB: 12-17-1955  Reason for Encounter: Medication Review/Medication Coordination Call   Recent office visits:  None ID  Recent consult visits:  04/01/2021 Lauris Poag, MD (Orthopedic Surgery) for Pre-Op Exam - No medication changes noted  03/31/2021 Yvetta Coder, DPM (Podiatry) I don't have the appropriate security to download the document.  Hospital visits:  Medication Reconciliation was completed by comparing discharge summary, patient's EMR and Pharmacy list, and upon discussion with patient.  Admitted to the hospital on 04/15/2021 due to Total Hip Arthroplasty. Discharge date was 04/18/2021. Discharged from [facility].    New?Medications Started at [facility] Discharge:?? START taking: docusate sodium (COLACE) Take 1 capsule (100 mg total) by mouth 2 (two) times daily. enoxaparin (LOVENOX) Inject 0.4 mLs (40 mg total) into the skin daily for 14 days. ferrous [id number] C-folic acid (TRINSICON / FOLTRIN) Take 1 capsule by mouth 2 (two) times daily after a meal. HYDROcodone-acetaminophen (NORCO/VICODIN) Take 1-2 tablets by mouth every 4 (four) hours as needed for moderate pain (pain score 4-6). methocarbamol (ROBAXIN) Take 1 tablet (500 mg total) by mouth every 6 (six) hours as needed for muscle spasms.  Medication Changes at Hospital Discharge: CHANGE how you take: sertraline (ZOLOFT) TAKE ONE TABLET BY  MOUTH EVERYDAY AT BEDTIME traMADol (ULTRAM) Take 1 tablet (50 mg total) by mouth every 6 (six) hours as needed.   Medications Discontinued at Hospital Discharge: -STOP taking: ibuprofen 200 MG tablet (ADVIL) meloxicam 15 MG tablet (MOBIC)  Medications that remain the same after Hospital Discharge:??  -All other medications will remain the same.    Medications: Outpatient Encounter Medications as of 04/23/2021  Medication Sig   acyclovir  ointment (ZOVIRAX) 5 % Apply 1 application topically every 3 (three) hours. (Patient taking differently: Apply 1 application topically every 3 (three) hours as needed (fever blisters).)   ARIPiprazole (ABILIFY) 10 MG tablet Take 1 tablet (10 mg total) by mouth daily.   atorvastatin (LIPITOR) 40 MG tablet Take 1 tablet (40 mg total) by mouth daily.   busPIRone (BUSPAR) 30 MG tablet Take 1 tablet (30 mg total) by mouth 3 (three) times daily.   carvedilol (COREG) 12.5 MG tablet Take 1 tablet (12.5 mg total) by mouth 2 (two) times daily with a meal.   docusate sodium (COLACE) 100 MG capsule Take 1 capsule (100 mg total) by mouth 2 (two) times daily.   enoxaparin (LOVENOX) 40 MG/0.4ML injection Inject 0.4 mLs (40 mg total) into the skin daily for 14 days.   Eszopiclone 3 MG TABS Take 1 tablet (3 mg total) by mouth at bedtime. Take immediately before bedtime   ezetimibe (ZETIA) 10 MG tablet Take 1 tablet (10 mg total) by mouth daily.   ferrous Q000111Q C-folic acid (TRINSICON / FOLTRIN) capsule Take 1 capsule by mouth 2 (two) times daily after a meal.   fluticasone (FLONASE) 50 MCG/ACT nasal spray Place 2 sprays into both nostrils daily. (Patient taking differently: Place 2 sprays into both nostrils daily as needed for allergies.)   Fluticasone-Umeclidin-Vilant (TRELEGY ELLIPTA) 100-62.5-25 MCG/INH AEPB Inhale 1 puff into the lungs daily.   furosemide (LASIX) 20 MG tablet TAKE 1 TABLET BY MOUTH ONCE DAILY AS NEEDED FOR FLUID (Patient taking differently: Take 20 mg by mouth daily as needed for edema.)   gabapentin (NEURONTIN) 600 MG tablet Take 1 tablet (600 mg total) by mouth 3 (three) times daily.   Guaifenesin 1200 MG TB12  Take 1,200 mg by mouth daily as needed (congestion).   HYDROcodone-acetaminophen (NORCO/VICODIN) 5-325 MG tablet Take 1-2 tablets by mouth every 4 (four) hours as needed for moderate pain (pain score 4-6).   ipratropium (ATROVENT) 0.02 % nebulizer solution Take 2.5 mLs (0.5  mg total) by nebulization 4 (four) times daily as needed for wheezing or shortness of breath.   leflunomide (ARAVA) 10 MG tablet Take 10 mg by mouth at bedtime.    LINZESS 145 MCG CAPS capsule Take 145 mcg by mouth daily before breakfast.   methocarbamol (ROBAXIN) 500 MG tablet Take 1 tablet (500 mg total) by mouth every 6 (six) hours as needed for muscle spasms.   Multiple Vitamin (MULTIVITAMIN WITH MINERALS) TABS tablet Take 1 tablet by mouth daily.   Nutritional Supplements (ESTROVEN ENERGY PO) Take 1 tablet by mouth daily.    omeprazole (PRILOSEC) 40 MG capsule Take 1 capsule (40 mg total) by mouth 2 (two) times daily.   potassium chloride SA (KLOR-CON) 20 MEQ tablet Take 1 tablet (20 mEq total) by mouth daily with breakfast.   sertraline (ZOLOFT) 50 MG tablet TAKE ONE TABLET BY MOUTH EVERYDAY AT BEDTIME   topiramate (TOPAMAX) 25 MG tablet Take 1 tablet (25 mg total) by mouth 2 (two) times daily.   traMADol (ULTRAM) 50 MG tablet Take 1 tablet (50 mg total) by mouth every 6 (six) hours as needed.   valACYclovir (VALTREX) 1000 MG tablet TAKE 2 TABLETS BY MOUTH TWICE DAILY AS NEEDED FOR 5 DAYS (Patient taking differently: Take 2 g by mouth See admin instructions. Take 2 g twice daily for 5 days as needed for fever blisters)   venlafaxine XR (EFFEXOR-XR) 75 MG 24 hr capsule TAKE 3 CAPSULES BY MOUTH ONCE DAILY WITH BREAKFAST (Patient taking differently: Take 225 mg by mouth daily with breakfast.)   vitamin B-12 (CYANOCOBALAMIN) 1000 MCG tablet Take 6,000 mcg by mouth daily.   vitamin E 400 UNIT capsule Take 1,600 Units by mouth daily.   XIIDRA 5 % SOLN Place 1 drop into both eyes daily.   No facility-administered encounter medications on file as of 04/23/2021.   Care Gaps: Zoster Vaccines- Shingrix COVID-19 Vaccine INFLUENZA VACCINE  Star Rating Drugs: Atorvastatin 40 mg last filled on 03/27/2021 for a 30-Day supply with [facility]  BP Readings from Last 3 Encounters:  04/18/21 104/71   04/04/21 123/81  03/17/21 128/64    Lab Results  Component Value Date   HGBA1C 5.0 04/14/2018     Patient obtains medications through Adherence Packaging  30 Days   Last adherence delivery included:  Meloxicam 15 mg tablet- Take one tablet by mouth once daily (bedtime) Leflunomide 10 mg tablet- Take one tablet by mouth once daily (bedtime) Ezetimibe 10 mg tablet Take one tablet by mouth daily (breakfast) Trelegy 100-62.5-25 MCG- Inhale 1 puff daily Atorvastatin 40 mg tablet- Take one tablet by mouth daily (breakfast) Carvedilol 12.5 mg tablet - Take on tablet by mouth twice daily (breakfast, bedtime) Potassium 20 mEq - Take one capsule daily (breakfast) Gabapentin 600 mg tablet- Take one tablet by mouth three times daily (breakfast, lunch, bedtime) Eszopiclone 3 mg tablet - Take one tablet by mouth nightly (bedtime) Topiramate 25 mg tablet-Take one tablet by mouth twice daily (breakfast, bedtime) Buspirone 30 mg tablet- Take one tablet by mouth three times daily (breakfast, lunch, bedtime) Aripiprazole 10 mg tablet- Take one tablet by mouth daily (breakfast) Sertraline 50 mg tablet- Take one tablet by mouth daily (bedtime) Venlafaxine ER 75 mg capsule-  Take three capsules by mouth daily (breakfast) Omeprazole 40 mg- Take one capsule by mouth two times a day (breakfast, bedtime) Linzess 145 mcg- Take one tablet by mouth daily (before breakfast) (Vials)  Patient declined medications last month:  Ipratropium Bromide 0.02 solution- Inhale 2.56ms via nebulizer four times daily (PRN)  Furosemide 20 mg tablet-Take one tablet by mouth once daily (PRN)  Patient is due for next adherence delivery on: 05/01/2021 (Thursday) 2nd Route.  Called patient and reviewed medications and coordinated delivery.  This delivery to include: Sertraline 50 mg tablet- Take one tablet by mouth daily (bedtime) Meloxicam 15 mg tablet- Take one tablet by mouth once daily (bedtime) Leflunomide 10 mg tablet-  Take one tablet by mouth once daily (bedtime) Ezetimibe 10 mg tablet Take one tablet by mouth daily (breakfast) Trelegy 100-62.5-25 MCG- Inhale 1 puff daily Atorvastatin 40 mg tablet- Take one tablet by mouth daily (breakfast) Carvedilol 12.5 mg tablet - Take on tablet by mouth twice daily (breakfast, bedtime) Gabapentin 600 mg tablet- Take one tablet by mouth three times daily (breakfast, lunch, bedtime) Eszopiclone 3 mg tablet - Take one tablet by mouth nightly (bedtime) Topiramate 25 mg tablet-Take one tablet by mouth twice daily (breakfast, bedtime) Venlafaxine ER 75 mg capsule- Take three capsules by mouth daily (breakfast) Omeprazole 40 mg- Take one capsule by mouth two times a day (breakfast, bedtime) Linzess 145 mcg- Take one tablet by mouth daily (before breakfast) (Vials) Buspirone 30 mg tablet- Take one tablet by mouth three times daily (breakfast, lunch, bedtime) Potassium 20 mEq - Take one capsule daily (breakfast) Aripiprazole 10 mg tablet- Take one tablet by mouth daily (breakfast)  Patient declined the following medications: Ipratropium Bromide 0.02 solution- Inhale 2.5101m via nebulizer four times daily (PRN)  Furosemide 20 mg tablet-Take one tablet by mouth once daily (PRN)  Patient needs refills for None ID.  Confirmed delivery date of 05/01/2021 (Thursday) 2nd route, advised patient that pharmacy will contact them the morning of delivery.  ToLynann BolognaCPA/CMA Clinical Pharmacist Assistant Phone: [phone]

## 2021-04-24 MED ORDER — VENLAFAXINE HCL ER 75 MG PO CP24
225.0000 mg | ORAL_CAPSULE | Freq: Every day | ORAL | 1 refills | Status: DC
Start: 2021-04-24 — End: 2021-10-22

## 2021-04-24 MED ORDER — OMEPRAZOLE 40 MG PO CPDR: 40.0000 mg | DELAYED_RELEASE_CAPSULE | Freq: Two times a day (BID) | ORAL | 1 refills | Status: DC

## 2021-04-24 NOTE — Addendum Note (Signed)
Addended by: Daron Offer A on: 04/24/2021 02:54 PM   Modules accepted: Orders

## 2021-05-03 DIAGNOSIS — M069 Rheumatoid arthritis, unspecified: Secondary | ICD-10-CM | POA: Diagnosis not present

## 2021-05-03 DIAGNOSIS — Z471 Aftercare following joint replacement surgery: Secondary | ICD-10-CM | POA: Diagnosis not present

## 2021-05-03 DIAGNOSIS — K7460 Unspecified cirrhosis of liver: Secondary | ICD-10-CM | POA: Diagnosis not present

## 2021-05-03 DIAGNOSIS — Z6830 Body mass index (BMI) 30.0-30.9, adult: Secondary | ICD-10-CM | POA: Diagnosis not present

## 2021-05-03 DIAGNOSIS — Z96642 Presence of left artificial hip joint: Secondary | ICD-10-CM | POA: Diagnosis not present

## 2021-05-03 DIAGNOSIS — K5909 Other constipation: Secondary | ICD-10-CM | POA: Diagnosis not present

## 2021-05-03 DIAGNOSIS — R634 Abnormal weight loss: Secondary | ICD-10-CM | POA: Diagnosis not present

## 2021-05-03 DIAGNOSIS — I10 Essential (primary) hypertension: Secondary | ICD-10-CM | POA: Diagnosis not present

## 2021-05-03 DIAGNOSIS — J449 Chronic obstructive pulmonary disease, unspecified: Secondary | ICD-10-CM | POA: Diagnosis not present

## 2021-05-03 DIAGNOSIS — F331 Major depressive disorder, recurrent, moderate: Secondary | ICD-10-CM | POA: Diagnosis not present

## 2021-05-03 DIAGNOSIS — M5010 Cervical disc disorder with radiculopathy, unspecified cervical region: Secondary | ICD-10-CM | POA: Diagnosis not present

## 2021-05-03 DIAGNOSIS — E7800 Pure hypercholesterolemia, unspecified: Secondary | ICD-10-CM | POA: Diagnosis not present

## 2021-05-03 DIAGNOSIS — D649 Anemia, unspecified: Secondary | ICD-10-CM | POA: Diagnosis not present

## 2021-05-03 DIAGNOSIS — G629 Polyneuropathy, unspecified: Secondary | ICD-10-CM | POA: Diagnosis not present

## 2021-05-03 DIAGNOSIS — M48062 Spinal stenosis, lumbar region with neurogenic claudication: Secondary | ICD-10-CM | POA: Diagnosis not present

## 2021-05-03 DIAGNOSIS — Z7901 Long term (current) use of anticoagulants: Secondary | ICD-10-CM | POA: Diagnosis not present

## 2021-05-03 DIAGNOSIS — K219 Gastro-esophageal reflux disease without esophagitis: Secondary | ICD-10-CM | POA: Diagnosis not present

## 2021-05-03 DIAGNOSIS — F419 Anxiety disorder, unspecified: Secondary | ICD-10-CM | POA: Diagnosis not present

## 2021-05-03 DIAGNOSIS — Z7951 Long term (current) use of inhaled steroids: Secondary | ICD-10-CM | POA: Diagnosis not present

## 2021-05-03 DIAGNOSIS — G894 Chronic pain syndrome: Secondary | ICD-10-CM | POA: Diagnosis not present

## 2021-05-03 DIAGNOSIS — M5416 Radiculopathy, lumbar region: Secondary | ICD-10-CM | POA: Diagnosis not present

## 2021-05-03 DIAGNOSIS — M4722 Other spondylosis with radiculopathy, cervical region: Secondary | ICD-10-CM | POA: Diagnosis not present

## 2021-05-18 ENCOUNTER — Other Ambulatory Visit: Payer: Self-pay | Admitting: Family

## 2021-05-18 ENCOUNTER — Other Ambulatory Visit: Payer: Self-pay | Admitting: Student in an Organized Health Care Education/Training Program

## 2021-05-18 ENCOUNTER — Other Ambulatory Visit: Payer: Self-pay | Admitting: Cardiovascular Disease

## 2021-05-18 DIAGNOSIS — M47812 Spondylosis without myelopathy or radiculopathy, cervical region: Secondary | ICD-10-CM

## 2021-05-18 DIAGNOSIS — I10 Essential (primary) hypertension: Secondary | ICD-10-CM

## 2021-05-18 DIAGNOSIS — I471 Supraventricular tachycardia: Secondary | ICD-10-CM

## 2021-05-19 NOTE — Telephone Encounter (Signed)
Rx(s) sent to pharmacy electronically.  

## 2021-05-22 ENCOUNTER — Telehealth: Payer: Self-pay

## 2021-05-22 NOTE — Progress Notes (Signed)
    Chronic Care Management Pharmacy Assistant   Name: Gabrielle Pineda  MRN: [id number] DOB: 1955/12/25  Patient called to be reminded of her appointment with Junius Argyle, CPP on 05/23/2021 @0815  via telephone  Patient aware of appointment date, time, and type of appointment (either telephone or in person). Patient aware to have/bring all medications, supplements, blood pressure and/or blood sugar logs to visit.  Questions: Are there any concerns you would like to discuss during your office visit? None at this time Are you having any problems obtaining your medications? Not at this time.  Star Rating Drug: Atorvastatin 40 mg last filled on 04/28/2021 for a 30-Day supply with [facility]   Any gaps in medications fill history? No  Care Gaps: Zoster Vaccines COVID-19 Vaccines Booster 3 Influenza Vaccine Mammogram  Lynann Bologna, CPA/CMA Clinical Pharmacist Assistant Phone: [phone]

## 2021-05-23 ENCOUNTER — Telehealth: Payer: Self-pay

## 2021-05-23 ENCOUNTER — Ambulatory Visit (INDEPENDENT_AMBULATORY_CARE_PROVIDER_SITE_OTHER): Payer: PPO

## 2021-05-23 DIAGNOSIS — F5101 Primary insomnia: Secondary | ICD-10-CM

## 2021-05-23 DIAGNOSIS — F331 Major depressive disorder, recurrent, moderate: Secondary | ICD-10-CM

## 2021-05-23 NOTE — Telephone Encounter (Signed)
Copied from [place] [id number]. Topic: General - Other >> May 23, 2021  8:41 AM Leward Quan A wrote: Reason for CRM: Patient called in asking to be scheduled with one of the new providers and would like a call back at Ph# [phone]

## 2021-05-23 NOTE — Patient Instructions (Signed)
Visit Information It was great speaking with you today!  Please let me know if you have any questions about our visit.   Goals Addressed   None     Patient Care Plan: General Pharmacy (Adult)     Problem Identified: Hypertension, Hyperlipidemia, COPD, Depression, Tobacco use and Chronic Pain, and Rheumatoid Arthritis   Priority: High     Long-Range Goal: Patient-Specific Goal   Start Date: 11/25/2020  Expected End Date: 03/28/2022  This Visit's Progress: On track  Recent Progress: On track  Priority: High  Note:   Current Barriers:  Unable to independently afford treatment regimen Unable to achieve control of COPD  Suboptimal therapeutic regimen for depression  Pharmacist Clinical Goal(s):  Patient will verbalize ability to afford treatment regimen achieve control of COPD as evidenced by stable breathing, avoiding cigarette use, and absence of exacerbations adhere to plan to optimize therapeutic regimen for depression as evidenced by report of adherence to recommended medication management changes through collaboration with PharmD and provider.   Interventions: 1:1 collaboration with Lavon Paganini MD regarding development and update of comprehensive plan of care as evidenced by provider attestation and co-signature Inter-disciplinary care team collaboration (see longitudinal plan of care) Comprehensive medication review performed; medication list updated in electronic medical record  Hypertension (BP goal <140/90) -Controlled -Current treatment: Carvedilol 12.5 mg twice daily  Furosemide 20 mg daily as needed  -Medications previously tried: NA  -Current home readings: NA -Denies hypotensive/hypertensive symptoms -Patient does report increased fatigue and lethargy since starting carvedilol, although patient does report poor sleep quality -Educated on Daily salt intake goal < 2300 mg; Exercise goal of 150 minutes per week; -Counseled to monitor BP at home weekly,  document, and provide log at future appointments -Recommended to continue current medication  Hyperlipidemia: (LDL goal < 70) -Controlled -Current treatment: Atorvastatin 40 mg daily  Ezetimibe 10 mg daily  -Medications previously tried: NA  -Educated on Benefits of statin for ASCVD risk reduction; -Recommended to continue current medication  COPD (Goal: control symptoms and prevent exacerbations) -Controlled -Current treatment  Ipratropium 0.02% nebulizer 2.5 mL every 4 hours Trelegy 1 puff daily  -Medications previously tried: NA  -Gold Grade: Gold 2 (FEV1 50-79%) -Current COPD Classification:  B (high sx, <2 exacerbations/yr) -MMRC/CAT score: NA -Pulmonary function testing: 71% (2017) -Exacerbations requiring treatment in last 6 months: yes -Breathing much improved since quitting smoking and daily use of Trelegy. Patient rarely needs to use nebulizer solution.  -Recommended to continue current medication Assessed patient finances. Trelegy patient assistance was denied  Depression/Anxiety/ Insomnia (Goal: Maintain stable mood) -Not ideally controlled -Current treatment: Aripiprazole 10 mg daily  Buspirone 30 mg three times daily  Eszopiclone 3 mg at bedtime  Sertraline 50 mg daily  Venlafaxine XR 75 mg 3 capsules daily -Medications previously tried/failed: NA -PHQ9: 0 -GAD7: 9 -Able to fall asleep within 15-20 minutes, wakes up 4-5 times night. but often wakes up multiple times throughout the night -Counseled on proper sleep hygiene.  -SSRI and SNRI combination not recommended, patient tolerated previous dose decrease well.  -DECREASE Sertraline to 25 mg for one week, then STOP sertraline.   Tobacco use (Goal quit smoking) -Controlled -Previous quit attempts: Nicotine Patches (completed course)  -Current treatment  None -Patient smokes After 30 minutes of waking -Patient triggers include: finishing a meal  -Patient continues to have triggers to smoke, but so far  has been motivated to stay away from it due to her lung condition -Counseled on importance of continued cessation  of tobacco products, tools for managing cravings.   Patient Goals/Self-Care Activities Patient will:  - check blood pressure weekly, document, and provide at future appointments Utilize maintenance inhaler daily  Follow Up Plan: Telephone follow up appointment with care management team member scheduled for:  07/25/2021 at 8:30 AM    Patient agreed to services and verbal consent obtained.   Patient verbalizes understanding of instructions provided today and agrees to view in [place].   Junius Argyle, PharmD, [place], [place] [phone]

## 2021-05-23 NOTE — Progress Notes (Signed)
Chronic Care Management Pharmacy Assistant   Name: Gabrielle Pineda  MRN: [id number] DOB: 05/29/1956  05/26/2021  Contacted Dr. Carlyle Dolly office requesting the Linzess 145 mcg. I was informed the prescription was sent electronically on 05/22/2021 with a verified confirmation of receipt. Informed Junius Argyle, CPP.  Looking at [facility] it appears the escript was sent and denied by our pharmacy. I did request the providers office to resend the escript just incase it was a mistake that [facility] denied the original escript that was recently sent.   Reached out to to Dr. Elwyn Lade office and requested refill for patient's Topiramate 25 mg tablet. Spoke with the nurse Dena and she stated she would make the request to have the refill sent to [facility]. I informed her that this is her primary pharmacy.    05/23/2021 Patient is due for her monthly refill. Junius Argyle, CPP reached out to have me contact Dr. Carlyle Dolly  but the office is closed by 1200 on Fridays, and looking at the patient's chart she has not been seen by this provider in one year. I will give them a call on Monday to request the prescription.  There is also a request to contact [name], MD Office to request patient's Topiramate refill be sent. Called Dr. Elwyn Lade Office at [phone] and had to leave a VM as this office closes at 12 every Friday.  Update sent to CPP regarding this information will try to contact both offices on Monday.  Medications: Outpatient Encounter Medications as of 05/23/2021  Medication Sig   acyclovir ointment (ZOVIRAX) 5 % Apply 1 application topically every 3 (three) hours. (Patient taking differently: Apply 1 application topically every 3 (three) hours as needed (fever blisters).)   ARIPiprazole (ABILIFY) 10 MG tablet Take 1 tablet (10 mg total) by mouth daily.   atorvastatin (LIPITOR) 40 MG tablet TAKE ONE TABLET BY MOUTH EVERY MORNING   busPIRone (BUSPAR) 30 MG tablet Take 1  tablet (30 mg total) by mouth 3 (three) times daily.   carvedilol (COREG) 12.5 MG tablet TAKE ONE TABLET BY MOUTH EVERY MORNING and TAKE ONE TABLET BY MOUTH EVERYDAY AT BEDTIME   Eszopiclone 3 MG TABS Take 1 tablet (3 mg total) by mouth at bedtime. Take immediately before bedtime   ezetimibe (ZETIA) 10 MG tablet Take 1 tablet (10 mg total) by mouth daily.   fluticasone (FLONASE) 50 MCG/ACT nasal spray Place 2 sprays into both nostrils daily. (Patient taking differently: Place 2 sprays into both nostrils daily as needed for allergies.)   Fluticasone-Umeclidin-Vilant (TRELEGY ELLIPTA) 100-62.5-25 MCG/INH AEPB Inhale 1 puff into the lungs daily.   furosemide (LASIX) 20 MG tablet TAKE 1 TABLET BY MOUTH ONCE DAILY AS NEEDED FOR FLUID (Patient taking differently: Take 20 mg by mouth daily as needed for edema.)   gabapentin (NEURONTIN) 600 MG tablet Take 1 tablet (600 mg total) by mouth 3 (three) times daily.   Guaifenesin 1200 MG TB12 Take 1,200 mg by mouth daily as needed (congestion).   ipratropium (ATROVENT) 0.02 % nebulizer solution Take 2.5 mLs (0.5 mg total) by nebulization 4 (four) times daily as needed for wheezing or shortness of breath.   leflunomide (ARAVA) 10 MG tablet Take 10 mg by mouth at bedtime.    LINZESS 145 MCG CAPS capsule Take 145 mcg by mouth daily before breakfast.   Multiple Vitamin (MULTIVITAMIN WITH MINERALS) TABS tablet Take 1 tablet by mouth daily.   Nutritional Supplements (ESTROVEN ENERGY PO) Take 1 tablet by  mouth daily.    omeprazole (PRILOSEC) 40 MG capsule Take 1 capsule (40 mg total) by mouth 2 (two) times daily.   potassium chloride SA (KLOR-CON) 20 MEQ tablet Take 1 tablet (20 mEq total) by mouth daily with breakfast.   sertraline (ZOLOFT) 50 MG tablet TAKE ONE TABLET BY MOUTH EVERYDAY AT BEDTIME   topiramate (TOPAMAX) 25 MG tablet Take 1 tablet (25 mg total) by mouth 2 (two) times daily.   traMADol (ULTRAM) 50 MG tablet Take 1 tablet (50 mg total) by mouth every 6  (six) hours as needed.   valACYclovir (VALTREX) 1000 MG tablet TAKE 2 TABLETS BY MOUTH TWICE DAILY AS NEEDED FOR 5 DAYS (Patient taking differently: Take 2 g by mouth See admin instructions. Take 2 g twice daily for 5 days as needed for fever blisters)   venlafaxine XR (EFFEXOR-XR) 75 MG 24 hr capsule Take 3 capsules (225 mg total) by mouth daily with breakfast. TAKE 3 CAPSULES BY MOUTH ONCE DAILY WITH BREAKFAST   vitamin B-12 (CYANOCOBALAMIN) 1000 MCG tablet Take 6,000 mcg by mouth daily.   vitamin E 400 UNIT capsule Take 1,600 Units by mouth daily.   XIIDRA 5 % SOLN Place 1 drop into both eyes daily.   No facility-administered encounter medications on file as of 05/23/2021.

## 2021-05-23 NOTE — Progress Notes (Signed)
Chronic Care Management Pharmacy Note  05/23/2021 Name:  Gabrielle Pineda MRN:  [id number] DOB:  1955-09-22  Subjective:  Summary: Patient presents for ccm follow-up. She has finished PT following her hip surgery. She is recovering well overall. Reports her mood is doing well.    Recommendations/Changes made from today's visit: -DECREASE Sertraline to 25 mg for one week, then STOP sertraline.   Plan: Patient to schedule new PCP visit.  CPP follow-up in two months   Gabrielle Pineda is an 65 y.o. year old female who is a primary patient of Lavon Paganini MD.  The CCM team was consulted for assistance with disease management and care coordination needs.    Engaged with patient by telephone for follow up visit in response to provider referral for pharmacy case management and/or care coordination services.   Consent to Services:  The patient was given information about Chronic Care Management services, agreed to services, and gave verbal consent prior to initiation of services.  Please see initial visit note for detailed documentation.   Patient Care Team: Chrismon, Vickki Muff, PA-C (Inactive) as PCP - General (Family Medicine) Minna Merritts, MD as PCP - Cardiology (Cardiology) Manya Silvas, MD (Inactive) (Gastroenterology) Germaine Pomfret, [facility] (Pharmacist)  Recent office visits: 12/10/20: Patient presented to Vernie Murders, PA-C for follow-up.  11/08/20: Patient presented to Fenton Malling, PA-C for hospital follow-up. Diltiazem, hydoxyzine, trazodone stopped. Eszopiclone 3 mg started, prednisone 12 day course started.   Recent consult visits: 02/06/21: Patient presented to Dr. Jefm Bryant (Rheumatology) for follow-up. No medication changes noted.  11/20/20: Patient presented to Laurann Montana, NP (Cardiology) for follow-up.   Hospital visits: 04/15/21: Patient hospitalized for hip surgery. Lovenox 40 mg daily. Ibuprofen, Meloxicam stopped.  Admitted to the  hospital on 08/018/2022 due to Thoracic Spinal Cord Stimulator & Pulse generator Placement. Discharge date was 03/17/2021. Discharged from [facility] 3/16-3/20: Patient was hospitalized at [facility] for acute respiratory failure and COPD exacerbation. Continue prednisone course for 2 more days. Stopped diltiazem, Started on Carvedilol 12.5 mg twice daily due to tachycardia. QTc 607 ms, holding aripiprazole, trazodone.   Objective:  Lab Results  Component Value Date   CREATININE 0.99 04/16/2021   BUN 12 04/16/2021   GFRNONAA >60 04/16/2021   GFRAA 72 05/06/2020   NA 139 04/16/2021   K 3.5 04/16/2021   CALCIUM 8.3 (L) 04/16/2021   CO2 23 04/16/2021   GLUCOSE 134 (H) 04/16/2021    Lab Results  Component Value Date/Time   HGBA1C 5.0 04/14/2018 11:24 AM   HGBA1C 5.2 04/16/2017 11:01 AM    Last diabetic Eye exam: No results found for: HMDIABEYEEXA  Last diabetic Foot exam: No results found for: HMDIABFOOTEX   Lab Results  Component Value Date   CHOL 172 04/19/2020   HDL 56 04/19/2020   LDLCALC 98 04/19/2020   TRIG 102 04/19/2020   CHOLHDL 3.1 04/19/2020    Hepatic Function Latest Ref Rng & Units 04/04/2021 05/06/2020 04/19/2020  Total Protein 6.5 - 8.1 g/dL 7.0 - 6.4  Albumin 3.5 - 5.0 g/dL 3.4(L) 4.2 4.3  AST 15 - 41 U/L 19 - 28  ALT 0 - 44 U/L 18 - 25  Alk Phosphatase 38 - 126 U/L 186(H) - 115  Total Bilirubin 0.3 - 1.2 mg/dL 0.3 - <0.2  Bilirubin, Direct 0.00 - 0.40 mg/dL - - -    Lab Results  Component Value Date/Time   TSH 1.310 04/17/2019 10:37 AM   TSH 1.700  04/14/2018 11:24 AM    CBC Latest Ref Rng & Units 04/18/2021 04/17/2021 04/16/2021  WBC 4.0 - 10.5 K/uL 7.4 7.4 9.9  Hemoglobin 12.0 - 15.0 g/dL 8.0(L) 7.8(L) 8.8(L)  Hematocrit 36.0 - 46.0 % 25.1(L) 24.5(L) 28.0(L)  Platelets 150 - 400 K/uL 181 188 206    No results found for: VD25OH  Clinical ASCVD: No  The 10-year ASCVD risk score (Arnett DK, et al., 2019) is: 9.7%   Values used to  calculate the score:     Age: 65 years     Sex: Female     Is Non-Hispanic African American: No     Diabetic: No     Tobacco smoker: Yes     Systolic Blood Pressure: 124 mmHg     Is BP treated: Yes     HDL Cholesterol: 56 mg/dL     Total Cholesterol: 172 mg/dL    Depression screen [facility] 2/9 01/01/2021 12/25/2020 11/08/2020  Decreased Interest 0 0 0  Down, Depressed, Hopeless 0 0 0  PHQ - 2 Score 0 0 0  Altered sleeping - - 0  Tired, decreased energy - - 1  Change in appetite - - 0  Feeling bad or failure about yourself  - - 0  Trouble concentrating - - 0  Moving slowly or fidgety/restless - - 0  Suicidal thoughts - - 0  PHQ-9 Score - - 1  Difficult doing work/chores - - Not difficult at all  Some recent data might be hidden    Social History   Tobacco Use  Smoking Status Former   Packs/day: 1.00   Years: 40.00   Pack years: 40.00   Types: Cigarettes   Quit date: 10/30/2020   Years since quitting: 0.5  Smokeless Tobacco Never   BP Readings from Last 3 Encounters:  04/18/21 104/71  04/04/21 123/81  03/17/21 128/64   Pulse Readings from Last 3 Encounters:  04/18/21 81  04/04/21 68  03/17/21 85   Wt Readings from Last 3 Encounters:  04/04/21 164 lb 8 oz (74.6 kg)  03/17/21 150 lb (68 kg)  03/06/21 155 lb (70.3 kg)   BMI Readings from Last 3 Encounters:  04/04/21 24.29 kg/m  03/17/21 22.15 kg/m  03/06/21 22.89 kg/m    Assessment/Interventions: Review of patient past medical history, allergies, medications, health status, including review of consultants reports, laboratory and other test data, was performed as part of comprehensive evaluation and provision of chronic care management services.   SDOH:  (Social Determinants of Health) assessments and interventions performed: Yes     SDOH Screenings   Alcohol Screen: Low Risk    Last Alcohol Screening Score (AUDIT): 0  Depression (PHQ2-9): Low Risk    PHQ-2 Score: 0  Financial Resource Strain: Low Risk     Difficulty of Paying Living Expenses: Not hard at all  Food Insecurity: Not on file  Housing: Not on file  Physical Activity: Not on file  Social Connections: Not on file  Stress: Not on file  Tobacco Use: Medium Risk   Smoking Tobacco Use: Former   Smokeless Tobacco Use: Never  Transportation Needs: No Transportation Needs   Lack of Transportation (Medical): No   Lack of Transportation (Non-Medical): No    CCM Care Plan  Allergies  Allergen Reactions   Plaquenil [Hydroxychloroquine] Rash    Medications Reviewed Today     Reviewed by Tresa Endo, RN (Registered Nurse) on 04/15/21 at [phone]  Med List Status: Complete   Medication Order Taking?  Sig Documenting Provider Last Dose Status Informant  acyclovir ointment (ZOVIRAX) 5 % [id number] Yes Apply 1 application topically every 3 (three) hours.  Patient taking differently: Apply 1 application topically every 3 (three) hours as needed (fever blisters).   [name], [place] 04/14/2021 Active   ARIPiprazole (ABILIFY) 10 MG tablet [id number] Yes Take 1 tablet (10 mg total) by mouth daily. Virginia Crews, MD 04/15/2021 Active Self  atorvastatin (LIPITOR) 40 MG tablet [id number] Yes Take 1 tablet (40 mg total) by mouth daily. [name], [place] 04/15/2021 Active Self  busPIRone (BUSPAR) 30 MG tablet [id number] Yes Take 1 tablet (30 mg total) by mouth 3 (three) times daily. Virginia Crews, MD 04/15/2021 Active Self  carvedilol (COREG) 12.5 MG tablet [id number] Yes Take 1 tablet (12.5 mg total) by mouth 2 (two) times daily with a meal. Loel Dubonnet, NP 04/15/2021 Active Self  Eszopiclone 3 MG TABS [id number] Yes Take 1 tablet (3 mg total) by mouth at bedtime. Take immediately before bedtime Birdie Sons, MD 04/14/2021 Active Self  ezetimibe (ZETIA) 10 MG tablet [id number] Yes Take 1 tablet (10 mg total) by mouth daily. [name], [place] 04/15/2021 Active Self  fluticasone (FLONASE) 50 MCG/ACT nasal spray  [id number] Yes Place 2 sprays into both nostrils daily.  Patient taking differently: Place 2 sprays into both nostrils daily as needed for allergies.   [name], PA-C Past Week Active   Fluticasone-Umeclidin-Vilant (TRELEGY ELLIPTA) 100-62.5-25 MCG/INH AEPB [id number] Yes Inhale 1 puff into the lungs daily. [name], [place] 04/14/2021 Active Self  furosemide (LASIX) 20 MG tablet [id number] Yes TAKE 1 TABLET BY MOUTH ONCE DAILY AS NEEDED FOR FLUID  Patient taking differently: Take 20 mg by mouth daily as needed for edema.   Fenton Malling M, PA-C  Active   gabapentin (NEURONTIN) 600 MG tablet [id number] Yes Take 1 tablet (600 mg total) by mouth 3 (three) times daily. [name], MD 04/15/2021 Active Self  Guaifenesin 1200 MG TB12 [id number] Yes Take 1,200 mg by mouth daily as needed (congestion). [provider] Past Week Active Self  ibuprofen (ADVIL) 200 MG tablet [id number] Yes Take 600 mg by mouth every 6 (six) hours as needed for moderate pain. [provider] 04/08/2021 Active Self  ipratropium (ATROVENT) 0.02 % nebulizer solution [id number] Yes Take 2.5 mLs (0.5 mg total) by nebulization 4 (four) times daily as needed for wheezing or shortness of breath. [name], [place] 04/15/2021 Active Self  leflunomide (ARAVA) 10 MG tablet [id number] Yes Take 10 mg by mouth at bedtime.  [provider] 04/15/2021 Active Self  LINZESS 145 MCG CAPS capsule [id number] Yes Take 145 mcg by mouth daily before breakfast. [provider] 04/14/2021 Active Self  meloxicam (MOBIC) 15 MG tablet [id number] Yes Take 15 mg by mouth daily. [provider] Past Month Active Self  Multiple Vitamin (MULTIVITAMIN WITH MINERALS) TABS tablet [id number] Yes Take 1 tablet by mouth daily. [provider] 04/08/2021 Active Self  Nutritional Supplements (ESTROVEN ENERGY PO) [id number] Yes Take 1 tablet by mouth daily.  [provider] 04/08/2021 Active  Self  omeprazole (PRILOSEC) 40 MG capsule [id number] Yes Take 1 capsule (40 mg total) by mouth 2 (two) times daily. [name], [place] 04/15/2021 Active Self  potassium chloride SA (KLOR-CON) 20 MEQ tablet [id number] Yes Take 1 tablet (20 mEq total) by mouth daily with breakfast. Margo Common, PA-C 04/15/2021 Active Self  sertraline (ZOLOFT) 50 MG tablet [id number] Yes Take 1 tablet (  50 mg total) by mouth daily. Tania Ade 04/15/2021 Active Self           Med Note Lucia Gaskins, CHERIE L   Thu Mar 06, 2021  8:38 AM)    topiramate (TOPAMAX) 25 MG tablet [id number] Yes Take 1 tablet (25 mg total) by mouth 2 (two) times daily. [name], MD 04/15/2021 Active Self  traMADol (ULTRAM) 50 MG tablet [id number] Yes Take 100 mg by mouth every 6 (six) hours as needed for pain. [provider] 04/15/2021 Active Self  valACYclovir (VALTREX) 1000 MG tablet [id number] Yes TAKE 2 TABLETS BY MOUTH TWICE DAILY AS NEEDED FOR 5 DAYS  Patient taking differently: Take 2 g by mouth See admin instructions. Take 2 g twice daily for 5 days as needed for fever blisters   [name], [place] 04/14/2021 Active   venlafaxine XR (EFFEXOR-XR) 75 MG 24 hr capsule [id number] Yes TAKE 3 CAPSULES BY MOUTH ONCE DAILY WITH BREAKFAST  Patient taking differently: Take 225 mg by mouth daily with breakfast.   [name], PA-C 04/14/2021 Active   vitamin B-12 (CYANOCOBALAMIN) 1000 MCG tablet [id number] Yes Take 6,000 mcg by mouth daily. [provider] 04/08/2021 Active Self  vitamin E 400 UNIT capsule [id number] Yes Take 1,600 Units by mouth daily. [provider] 04/08/2021 Active Self  XIIDRA 5 % SOLN [id number] Yes Place 1 drop into both eyes daily. [provider] 04/14/2021 Active Self  Med List Note Janett Billow, RN 09/07/17 1420): UDS 09/07/17            Patient Active Problem List   Diagnosis Date Noted   Status post total hip replacement, right 04/15/2021    Chronic radicular lumbar pain 10/03/2020   Postlaminectomy syndrome, lumbar region 06/03/2020   Acute non-recurrent pansinusitis 03/12/2020   Moderate episode of recurrent major depressive disorder ([place]) 12/04/2019   Spondylosis of cervical region without myelopathy or radiculopathy 09/21/2019   DDD (degenerative disc disease), cervical 09/21/2019   Cervicalgia 09/21/2019   Cervical fusion syndrome 09/21/2019   Chronic pain syndrome 09/21/2019   Spinal stenosis, lumbar region, with neurogenic claudication 08/15/2019   History of adenomatous polyp of colon 11/04/2017   Cervical radiculopathy 08/02/2017   Neuropathy 08/02/2017   Essential hypertension 07/28/2017   Closed compression fracture of L5 lumbar vertebra 07/07/2017   Sacral insufficiency fracture with routine healing 07/07/2017   COPD (chronic obstructive pulmonary disease) ([place]) 04/10/2015   Family history of malignant neoplasm of pancreas 04/03/2015   Hypercholesteremia 04/03/2015   Disorder of iron metabolism 04/03/2015   Weight loss 04/03/2015   Delayed onset of urination 04/03/2015   Acid reflux 10/11/2014   Closed fracture of distal phalanx of thumb 08/15/2014   Arthritis, degenerative 01/30/2014   Arthritis or polyarthritis, rheumatoid ([place]) 01/30/2014   Shortness of breath 12/22/2013   SMOKER 09/22/2010   Paroxysmal supraventricular tachycardia ([place]) 09/22/2010   CHEST PAIN UNSPECIFIED 09/22/2010   ELECTROCARDIOGRAM, ABNORMAL 09/22/2010   B-complex deficiency 09/09/2007   CN (constipation) 06/26/2007   Clinical depression 06/26/2007   Cold sore 06/26/2007   H/O alcohol abuse 06/26/2007   Cannot sleep 06/26/2007   Localized osteoarthrosis, hand 06/26/2007   Menopausal symptom 06/26/2007    Immunization History  Administered Date(s) Administered   Influenza Split 09/07/2012   Influenza,inj,Quad PF,6+ Mos 11/18/2015, 07/23/2016, 07/02/2017, 04/14/2018, 04/17/2019, 04/19/2020   Influenza-Unspecified  09/24/2014   PFIZER(Purple Top)SARS-COV-2 Vaccination 11/21/2019, 12/12/2019   Pneumococcal Polysaccharide-23 02/12/2011, 09/24/2014, 04/19/2020   Tdap 02/12/2011, 11/15/2015  Conditions to be addressed/monitored:  Hypertension, Hyperlipidemia, COPD, Depression, Tobacco use, and Insomnia, Chronic Pain, and Rheumatoid Arthritis  There are no care plans that you recently modified to display for this patient.   Medication Assistance:  Application for Trelegy denied  Patient's preferred pharmacy is:  [facility] - [place], [place] - [address] [place] [address] Phone: [phone] Fax: [phone]  [address] [place], [place] [address] Phone: [phone] Fax: [phone]  Patient decided to: Utilize [facility] for medication synchronization, packaging and delivery   Reviewed chart for medication changes ahead of medication coordination call.  No OVs, Consults, or hospital visits since last care coordination call/Pharmacist visit. (If appropriate, list visit date, provider name)  No medication changes indicated OR if recent visit, treatment plan here.  BP Readings from Last 3 Encounters:  04/18/21 104/71  04/04/21 123/81  03/17/21 128/64    Lab Results  Component Value Date   HGBA1C 5.0 04/14/2018     Patient obtains medications through Adherence Packaging  30 Days   Last adherence delivery included:  Sertraline 50 mg tablet- Take one tablet by mouth daily (bedtime) Meloxicam 15 mg tablet- Take one tablet by mouth once daily (bedtime) Leflunomide 10 mg tablet- Take one tablet by mouth once daily (bedtime) Ezetimibe 10 mg tablet Take one tablet by mouth daily (breakfast) Trelegy 100-62.5-25 MCG- Inhale 1 puff daily Atorvastatin 40 mg tablet- Take one tablet by mouth daily (breakfast) Carvedilol 12.5 mg tablet - Take on tablet by mouth  twice daily (breakfast, bedtime) Gabapentin 600 mg tablet- Take one tablet by mouth three times daily (breakfast, lunch, bedtime) Eszopiclone 3 mg tablet - Take one tablet by mouth nightly (bedtime) Topiramate 25 mg tablet-Take one tablet by mouth twice daily (breakfast, bedtime) Venlafaxine ER 75 mg capsule- Take three capsules by mouth daily (breakfast) Omeprazole 40 mg- Take one capsule by mouth two times a day (breakfast, bedtime) Linzess 145 mcg- Take one tablet by mouth daily (before breakfast) (Vials) Buspirone 30 mg tablet- Take one tablet by mouth three times daily (breakfast, lunch, bedtime) Potassium 20 mEq - Take one capsule daily (breakfast) Aripiprazole 10 mg tablet- Take one tablet by mouth daily (breakfast)  Patient declined (meds) last month due to PRN use/additional supply on hand. Explanation of abundance on hand (ie #30 due to overlapping fills or previous adherence issues etc)  Patient is due for next adherence delivery on: 06/02/21 (Monday) [facility]. Called patient and reviewed medications and coordinated delivery.  This delivery to include: Sertraline 50 mg tablet- Take one tablet by mouth daily (bedtime) (Vials due to tapering off) Meloxicam 15 mg tablet- Take one tablet by mouth once daily (bedtime) Leflunomide 10 mg tablet- Take one tablet by mouth once daily (bedtime) Ezetimibe 10 mg tablet Take one tablet by mouth daily (breakfast) Trelegy 100-62.5-25 MCG- Inhale 1 puff daily Atorvastatin 40 mg tablet- Take one tablet by mouth daily (breakfast) Carvedilol 12.5 mg tablet - Take on tablet by mouth twice daily (breakfast, bedtime) Gabapentin 600 mg tablet- Take one tablet by mouth three times daily (breakfast, lunch, bedtime) Eszopiclone 3 mg tablet - Take one tablet by mouth nightly (bedtime) Topiramate 25 mg tablet-Take one tablet by mouth twice daily (breakfast, bedtime) Venlafaxine ER 75 mg capsule- Take three capsules by mouth daily (breakfast) Omeprazole  40 mg- Take one capsule by mouth two times a day (breakfast, bedtime)  Linzess 145 mcg- Take one tablet by mouth daily (before breakfast) (Vials) Buspirone 30 mg tablet- Take one tablet by mouth three times daily (breakfast, lunch, bedtime) Potassium 20 mEq - Take one capsule daily (breakfast) Aripiprazole 10 mg tablet- Take one tablet by mouth daily (breakfast)  Patient declined the following medications (meds) due to (reason) Ipratropium Bromide 0.02 solution- Inhale 2.26ms via nebulizer four times daily (PRN)  Furosemide 20 mg tablet-Take one tablet by mouth once daily (PRN) Trelegy- Has an extra from hospital  Patient needs refills for Atorvastatin, Carvedilol, Topiramate, Linzess. Atorvastatin and carvedilol refills sent in by Dr. GRockey Situ [facility] to reach out for Linzess and topiramate refills.  Confirmed delivery date of 06/02/21, advised patient that pharmacy will contact them the morning of delivery.  Care Plan and Follow Up Patient Decision:  Patient agrees to Care Plan and Follow-up.  Plan: Telephone follow up appointment with care management team member scheduled for:  07/25/2021 at 8:30 AM  AJunius Argyle PharmD, CRancho [phone]

## 2021-05-26 ENCOUNTER — Other Ambulatory Visit: Payer: Self-pay | Admitting: Cardiovascular Disease

## 2021-05-26 NOTE — Telephone Encounter (Signed)
Please advise if ok to refill cardiac medication last filled by PCP.

## 2021-05-27 ENCOUNTER — Other Ambulatory Visit: Payer: Self-pay | Admitting: Student in an Organized Health Care Education/Training Program

## 2021-05-27 DIAGNOSIS — M47812 Spondylosis without myelopathy or radiculopathy, cervical region: Secondary | ICD-10-CM

## 2021-05-27 MED ORDER — TOPIRAMATE 25 MG PO TABS: 25.0000 mg | ORAL_TABLET | Freq: Two times a day (BID) | ORAL | 11 refills | Status: DC

## 2021-05-27 NOTE — Progress Notes (Signed)
Called patient to let her know that topamax has been sent in.

## 2021-05-28 DIAGNOSIS — Z96641 Presence of right artificial hip joint: Secondary | ICD-10-CM | POA: Diagnosis not present

## 2021-05-28 DIAGNOSIS — M1611 Unilateral primary osteoarthritis, right hip: Secondary | ICD-10-CM | POA: Diagnosis not present

## 2021-06-13 DIAGNOSIS — S92322D Displaced fracture of second metatarsal bone, left foot, subsequent encounter for fracture with routine healing: Secondary | ICD-10-CM | POA: Diagnosis not present

## 2021-06-13 DIAGNOSIS — S92315A Nondisplaced fracture of first metatarsal bone, left foot, initial encounter for closed fracture: Secondary | ICD-10-CM | POA: Diagnosis not present

## 2021-06-13 DIAGNOSIS — S92332D Displaced fracture of third metatarsal bone, left foot, subsequent encounter for fracture with routine healing: Secondary | ICD-10-CM | POA: Diagnosis not present

## 2021-06-16 DIAGNOSIS — Z96641 Presence of right artificial hip joint: Secondary | ICD-10-CM | POA: Diagnosis not present

## 2021-06-16 DIAGNOSIS — F331 Major depressive disorder, recurrent, moderate: Secondary | ICD-10-CM | POA: Diagnosis not present

## 2021-06-16 DIAGNOSIS — M1611 Unilateral primary osteoarthritis, right hip: Secondary | ICD-10-CM | POA: Diagnosis not present

## 2021-06-16 DIAGNOSIS — M25551 Pain in right hip: Secondary | ICD-10-CM | POA: Diagnosis not present

## 2021-06-17 ENCOUNTER — Other Ambulatory Visit: Payer: Self-pay | Admitting: Orthopedic Surgery

## 2021-06-17 DIAGNOSIS — M25551 Pain in right hip: Secondary | ICD-10-CM

## 2021-06-17 DIAGNOSIS — Z96641 Presence of right artificial hip joint: Secondary | ICD-10-CM

## 2021-06-19 ENCOUNTER — Telehealth: Payer: Self-pay

## 2021-06-19 NOTE — Progress Notes (Signed)
Chronic Care Management Pharmacy Assistant   Name: Gabrielle Pineda  MRN: [id number] DOB: 14-Aug-1956  Reason for Encounter: Medication Review/Medication Coordination Call  Recent office visits:  None ID  Recent consult visits:  06/16/2021 Lauris Poag, MD (Orthopedic Surgery) I do not have the appropriate security to download this document.  06/13/2021 Yvetta Coder, DPM (Podiatry) I do not have the appropriate security to download this document.  Hospital visits:  None in previous 6 months  Medications: Outpatient Encounter Medications as of 06/19/2021  Medication Sig   acyclovir ointment (ZOVIRAX) 5 % Apply 1 application topically every 3 (three) hours. (Patient taking differently: Apply 1 application topically every 3 (three) hours as needed (fever blisters).)   ARIPiprazole (ABILIFY) 10 MG tablet Take 1 tablet (10 mg total) by mouth daily.   atorvastatin (LIPITOR) 40 MG tablet TAKE ONE TABLET BY MOUTH EVERY MORNING   busPIRone (BUSPAR) 30 MG tablet Take 1 tablet (30 mg total) by mouth 3 (three) times daily.   carvedilol (COREG) 12.5 MG tablet TAKE ONE TABLET BY MOUTH EVERY MORNING and TAKE ONE TABLET BY MOUTH EVERYDAY AT BEDTIME   Eszopiclone 3 MG TABS Take 1 tablet (3 mg total) by mouth at bedtime. Take immediately before bedtime   ezetimibe (ZETIA) 10 MG tablet TAKE ONE TABLET BY MOUTH EVERY MORNING   fluticasone (FLONASE) 50 MCG/ACT nasal spray Place 2 sprays into both nostrils daily. (Patient taking differently: Place 2 sprays into both nostrils daily as needed for allergies.)   Fluticasone-Umeclidin-Vilant (TRELEGY ELLIPTA) 100-62.5-25 MCG/INH AEPB Inhale 1 puff into the lungs daily.   furosemide (LASIX) 20 MG tablet TAKE 1 TABLET BY MOUTH ONCE DAILY AS NEEDED FOR FLUID (Patient taking differently: Take 20 mg by mouth daily as needed for edema.)   gabapentin (NEURONTIN) 600 MG tablet Take 1 tablet (600 mg total) by mouth 3 (three) times daily.   Guaifenesin  1200 MG TB12 Take 1,200 mg by mouth daily as needed (congestion).   ipratropium (ATROVENT) 0.02 % nebulizer solution Take 2.5 mLs (0.5 mg total) by nebulization 4 (four) times daily as needed for wheezing or shortness of breath.   leflunomide (ARAVA) 10 MG tablet Take 10 mg by mouth at bedtime.    LINZESS 145 MCG CAPS capsule Take 145 mcg by mouth daily before breakfast.   Multiple Vitamin (MULTIVITAMIN WITH MINERALS) TABS tablet Take 1 tablet by mouth daily.   Nutritional Supplements (ESTROVEN ENERGY PO) Take 1 tablet by mouth daily.    omeprazole (PRILOSEC) 40 MG capsule Take 1 capsule (40 mg total) by mouth 2 (two) times daily.   potassium chloride SA (KLOR-CON) 20 MEQ tablet Take 1 tablet (20 mEq total) by mouth daily with breakfast.   sertraline (ZOLOFT) 50 MG tablet TAKE ONE TABLET BY MOUTH EVERYDAY AT BEDTIME   topiramate (TOPAMAX) 25 MG tablet Take 1 tablet (25 mg total) by mouth 2 (two) times daily.   traMADol (ULTRAM) 50 MG tablet Take 1 tablet (50 mg total) by mouth every 6 (six) hours as needed.   valACYclovir (VALTREX) 1000 MG tablet TAKE 2 TABLETS BY MOUTH TWICE DAILY AS NEEDED FOR 5 DAYS (Patient taking differently: Take 2 g by mouth See admin instructions. Take 2 g twice daily for 5 days as needed for fever blisters)   venlafaxine XR (EFFEXOR-XR) 75 MG 24 hr capsule Take 3 capsules (225 mg total) by mouth daily with breakfast. TAKE 3 CAPSULES BY MOUTH ONCE DAILY WITH BREAKFAST   vitamin B-12 (CYANOCOBALAMIN) 1000  MCG tablet Take 6,000 mcg by mouth daily.   vitamin E 400 UNIT capsule Take 1,600 Units by mouth daily.   XIIDRA 5 % SOLN Place 1 drop into both eyes daily.   No facility-administered encounter medications on file as of 06/19/2021.   Care Gaps: Zoster Vaccines  COVID-19 Vaccine Booster 3 Influenza Vaccine Mammogram  PNA Vaccine  Star Rating Drugs: Atorvastatin 40 mg last filled on 05/26/2021 for a 30-Day supply with [facility]  BP Readings from Last 3  Encounters:  04/18/21 104/71  04/04/21 123/81  03/17/21 128/64    Lab Results  Component Value Date   HGBA1C 5.0 04/14/2018     Patient obtains medications through Adherence Packaging  30 Days   Last adherence delivery included:  Sertraline 50 mg tablet- Take one tablet by mouth daily (Bedtime) (Vials due to tapering off) Meloxicam 15 mg tablet- Take one tablet by mouth once daily (Bedtime) Leflunomide 10 mg tablet- Take one tablet by mouth once daily (Bedtime) Ezetimibe 10 mg tablet Take one tablet by mouth daily (Breakfast) Trelegy 100-62.5-25 MCG- Inhale 1 puff daily Atorvastatin 40 mg tablet- Take one tablet by mouth daily (Breakfast) Carvedilol 12.5 mg tablet - Take on tablet by mouth twice daily (Breakfast, Bedtime) Gabapentin 600 mg tablet- Take one tablet by mouth three times daily (Breakfast, Lunch, Bedtime) Eszopiclone 3 mg tablet - Take one tablet by mouth nightly (Bedtime) Topiramate 25 mg tablet-Take one tablet by mouth twice daily (Breakfast, Bedtime) Venlafaxine ER 75 mg capsule- Take three capsules by mouth daily (Breakfast) Omeprazole 40 mg- Take one capsule by mouth two times a day (Breakfast, Bedtime) Linzess 145 mcg- Take one tablet by mouth daily (before breakfast) (Vials) Buspirone 30 mg tablet- Take one tablet by mouth three times daily (Breakfast, Lunch, Bedtime) Potassium 20 mEq - Take one capsule daily (Breakfast) Aripiprazole 10 mg tablet- Take one tablet by mouth daily (Breakfast)  Patient declined Medications last month: Ipratropium Bromide 0.02 solution- Inhale 2.76mls via nebulizer four times daily (PRN) Furosemide 20 mg tablet-Take one tablet by mouth once daily (PRN) Trelegy- Has an extra from hospital  Patient is due for next adherence delivery on: 07/01/2021 .(Tuesday) 2nd Route  Called patient and reviewed medications and coordinated delivery.  This delivery to include:  Meloxicam 15 mg tablet- Take one tablet by mouth once daily  (Bedtime) Leflunomide 10 mg tablet- Take one tablet by mouth once daily (Bedtime) Ezetimibe (Zetia) 10 mg tablet Take one tablet by mouth daily (Breakfast) Atorvastatin 40 mg tablet- Take one tablet by mouth daily (Breakfast) Carvedilol 12.5 mg tablet - Take on tablet by mouth twice daily (Breakfast, Bedtime) Topiramate 25 mg tablet-Take one tablet by mouth twice daily (Breakfast, Bedtime) Linzess 145 mcg- Take one tablet by mouth daily (Before Breakfast) (Vials) Gabapentin 600 mg tablet- Take one tablet by mouth three times daily (Breakfast, Lunch, Bedtime) Eszopiclone 3 mg tablet - Take one tablet by mouth nightly (Bedtime) Omeprazole 40 mg- Take one capsule by mouth two times a day (Breakfast, Bedtime) Venlafaxine ER 75 mg capsule- Take three capsules by mouth daily (Breakfast) Potassium 20 mEq - Take one capsule daily (Breakfast) Buspirone 30 mg tablet- Take one tablet by mouth three times daily (Breakfast, Lunch, Bedtime) Aripiprazole 10 mg tablet- Take one tablet by mouth daily (Breakfast) Trelegy 100-62.5-25 MCG- Inhale 1 puff daily  Patient declined the following medications:  Sertraline 50 mg tablet- Take one tablet by mouth daily (Bedtime) (Vials due to tapering off)- This medication should be discontinued but it's  still in patient's active medication list.  Ipratropium Bromide 0.02 solution- Inhale 2.48mls via nebulizer four times daily (PRN) Furosemide 20 mg tablet-Take one tablet by mouth once daily (PRN) Tramadol 50 mg 1 tablet q6h (PRN) Valtrex 1000 mg (PRN)  Patient needs refills for Buspirone 30 mg, Aripriprazole 10 mg, Trelegy. [facility] is able to send all refills.  Confirmed delivery date of 07/01/2021 2nd Route, advised patient that pharmacy will contact them the morning of delivery.  Patient has scheduled telephone appointment with [facility] on 07/25/2021 @ [place], [place] Pharmacist Assistant Phone: [phone]

## 2021-06-20 NOTE — Addendum Note (Signed)
Addended by: Daron Offer A on: 06/20/2021 03:53 PM   Modules accepted: Orders

## 2021-06-24 ENCOUNTER — Encounter: Payer: Self-pay | Admitting: Student in an Organized Health Care Education/Training Program

## 2021-06-24 ENCOUNTER — Ambulatory Visit
Payer: PPO | Attending: Student in an Organized Health Care Education/Training Program | Admitting: Student in an Organized Health Care Education/Training Program

## 2021-06-24 ENCOUNTER — Other Ambulatory Visit: Payer: Self-pay

## 2021-06-24 ENCOUNTER — Telehealth: Payer: Self-pay | Admitting: Family Medicine

## 2021-06-24 VITALS — BP 137/73 | HR 81 | Temp 96.9°F | Resp 16 | Ht 69.0 in | Wt 165.0 lb

## 2021-06-24 DIAGNOSIS — M47812 Spondylosis without myelopathy or radiculopathy, cervical region: Secondary | ICD-10-CM | POA: Insufficient documentation

## 2021-06-24 DIAGNOSIS — F411 Generalized anxiety disorder: Secondary | ICD-10-CM

## 2021-06-24 DIAGNOSIS — M5416 Radiculopathy, lumbar region: Secondary | ICD-10-CM | POA: Diagnosis not present

## 2021-06-24 DIAGNOSIS — G894 Chronic pain syndrome: Secondary | ICD-10-CM | POA: Insufficient documentation

## 2021-06-24 DIAGNOSIS — G629 Polyneuropathy, unspecified: Secondary | ICD-10-CM | POA: Diagnosis not present

## 2021-06-24 DIAGNOSIS — M961 Postlaminectomy syndrome, not elsewhere classified: Secondary | ICD-10-CM | POA: Insufficient documentation

## 2021-06-24 DIAGNOSIS — Z96641 Presence of right artificial hip joint: Secondary | ICD-10-CM | POA: Diagnosis not present

## 2021-06-24 DIAGNOSIS — F321 Major depressive disorder, single episode, moderate: Secondary | ICD-10-CM

## 2021-06-24 MED ORDER — OXYCODONE-ACETAMINOPHEN 5-325 MG PO TABS: 1.0000 | ORAL_TABLET | Freq: Every day | ORAL | 0 refills | Status: AC | PRN

## 2021-06-24 NOTE — Progress Notes (Signed)
Safety precautions to be maintained throughout the outpatient stay will include: orient to surroundings, keep bed in low position, maintain call bell within reach at all times, provide assistance with transfer out of bed and ambulation.  

## 2021-06-24 NOTE — Progress Notes (Signed)
PROVIDER NOTE: Information contained herein reflects review and annotations entered in association with encounter. Interpretation of such information and data should be left to medically-trained personnel. Information provided to patient can be located elsewhere in the medical record under "Patient Instructions". Document created using STT-dictation technology, any transcriptional errors that may result from process are unintentional.    Patient: Gabrielle Pineda  Service Category: E/M  Provider: [name], MD  DOB: 03/17/1956  DOS: 06/24/2021  Specialty: Interventional Pain Management  MRN: [id number]  Setting: Ambulatory outpatient  PCP: Gwyneth Sprout, FNP  Type: Established Patient    Referring Provider: No ref. provider found  Location: Office  Delivery: Face-to-face     HPI  Gabrielle Pineda, a 65 y.o. year old female, is here today because of her Chronic pain syndrome [G89.4]. Ms. Miguez primary complain today is Neck Pain and Back Pain (lower) Last encounter: My last encounter with her was on 5/18/2  Pain Assessment: Severity of Chronic pain is reported as a 6 /10. Location: Back Lower/both legs to the knees. Onset: More than a month ago. Quality: Other (Comment) (gnawing). Timing: Intermittent. Modifying factor(s): nothing. Vitals:  height is _0  (1.753 m) and weight is 165 lb (74.8 kg). Her temporal temperature is 96.9 F (36.1 C) (abnormal). Her blood pressure is 137/73 and her pulse is 81. Her respiration is 16 and oxygen saturation is 100%.   Reason for encounter:  Medication management.  [name]'s last visit with me was in May when she had her spinal cord stimulator leads removed.  She went on to have a spinal cord stimulator implant which she states was helpful.  In August she went on to have right hip replacement surgery with Dr. Rudene Christians at [facility].  She is recovering from the surgery although she states that she has irritation where her IPG is along her right  flank.  She does have intermittent pain and is requesting pain medications.  She continues gabapentin and Topamax as prescribed.  She has a refill of these.  She has tried tramadol and hydrocodone in the past with limited response.  She states that she would take this 1 tablet daily as needed which facilitates sleep and pain in the evening.  Pharmacotherapy Assessment  Analgesic:  Percocet 5 mg daily as needed  Monitoring: [place] PMP: PDMP reviewed during this encounter.       Pharmacotherapy: No side-effects or adverse reactions reported. Compliance: No problems identified. Effectiveness: Clinically acceptable.  Landis Martins, RN  06/24/2021  8:11 AM  Sign when Signing Visit Safety precautions to be maintained throughout the outpatient stay will include: orient to surroundings, keep bed in low position, maintain call bell within reach at all times, provide assistance with transfer out of bed and ambulation.     UDS:  Summary  Date Value Ref Range Status  09/07/2017 FINAL  Final    Comment:    ==================================================================== TOXASSURE COMP DRUG ANALYSIS,UR ==================================================================== Specimen Alert Note:  Urinary creatinine is low; ability to detect some drugs may be compromised.  Interpret results with caution. ==================================================================== Test                             Result       Flag       Units Drug Present and Declared for Prescription Verification   Oxycodone  493          EXPECTED   ng/mg creat   Oxymorphone                    433          EXPECTED   ng/mg creat   Noroxycodone                   3433         EXPECTED   ng/mg creat    Sources of oxycodone include scheduled prescription medications.    Oxymorphone and noroxycodone are expected metabolites of    oxycodone. Oxymorphone is also available as a scheduled    prescription  medication.   Gabapentin                     PRESENT      EXPECTED   Desmethylcyclobenzaprine       PRESENT      EXPECTED    Desmethylcyclobenzaprine is an expected metabolite of    cyclobenzaprine.   Trazodone                      PRESENT      EXPECTED   1,3 chlorophenyl piperazine    PRESENT      EXPECTED    1,3-chlorophenyl piperazine is an expected metabolite of    trazodone.   Venlafaxine                    PRESENT      EXPECTED   Desmethylvenlafaxine           PRESENT      EXPECTED    Desmethylvenlafaxine is an expected metabolite of venlafaxine. Drug Present not Declared for Prescription Verification   Acetaminophen                  PRESENT      UNEXPECTED   Naproxen                       PRESENT      UNEXPECTED   Diphenhydramine                PRESENT      UNEXPECTED   Doxylamine                     PRESENT      UNEXPECTED   Dextromethorphan               PRESENT      UNEXPECTED   Dextrorphan/Levorphanol        PRESENT      UNEXPECTED    Dextrorphan is an expected metabolite of dextromethorphan, an    over-the-counter or prescription cough suppressant. Dextrorphan    cannot be distinguished from the scheduled prescription    medication levorphanol by the method used for analysis. Drug Absent but Declared for Prescription Verification   Sertraline                     Not Detected UNEXPECTED   Ibuprofen                      Not Detected UNEXPECTED    Ibuprofen, as indicated in the declared medication list, is not    always detected even when used as directed.   Guaifenesin  Not Detected UNEXPECTED ==================================================================== Test                      Result    Flag   Units      Ref Range   Creatinine              15        L      mg/dL      >=20 ==================================================================== Declared Medications:  The flagging and interpretation on this report are based on the  following declared  medications.  Unexpected results may arise from  inaccuracies in the declared medications.  **Note: The testing scope of this panel includes these medications:  Cyclobenzaprine (Flexeril)  Gabapentin  Guaifenesin (Mucinex)  Oxycodone  Sertraline  Trazodone  Venlafaxine  **Note: The testing scope of this panel does not include small to  moderate amounts of these reported medications:  Ibuprofen  **Note: The testing scope of this panel does not include following  reported medications:  Albuterol (Proventil)  Amoxicillin (Augmentin)  Atorvastatin (Lipitor)  Clavulinate (Augmentin)  Folic acid  Meloxicam  Methotrexate  Multivitamin  Omeprazole (Prilosec)  Valacyclovir (Valtrex)  Varenicline (Chantix)  Vitamin B ==================================================================== For clinical consultation, please call [phone]. ====================================================================      ROS  Constitutional: Denies any fever or chills Gastrointestinal: No reported hemesis, hematochezia, vomiting, or acute GI distress Musculoskeletal:  Right hip, low back, bilateral leg, right shoulder pain Neurological: No reported episodes of acute onset apraxia, aphasia, dysarthria, agnosia, amnesia, paralysis, loss of coordination, or loss of consciousness  Medication Review  ARIPiprazole, Eszopiclone, Guaifenesin, Lifitegrast, Misc Natural Products, acyclovir ointment, atorvastatin, busPIRone, carvedilol, ezetimibe, fluticasone, furosemide, gabapentin, ipratropium, leflunomide, linaclotide, meloxicam, multivitamin with minerals, omeprazole, oxyCODONE-acetaminophen, potassium chloride SA, topiramate, traMADol, valACYclovir, venlafaxine XR, vitamin B-12, and vitamin E  History Review  Allergy: Ms. Hindle is allergic to plaquenil [hydroxychloroquine]. Drug: Ms. Schlossberg  reports no history of drug use. Alcohol:  reports no history of alcohol use. Tobacco:  reports that  she quit smoking about 7 months ago. Her smoking use included cigarettes. She has a 40.00 pack-year smoking history. She has never used smokeless tobacco. Social: Ms. Kossman  reports that she quit smoking about 7 months ago. Her smoking use included cigarettes. She has a 40.00 pack-year smoking history. She has never used smokeless tobacco. She reports that she does not drink alcohol and does not use drugs. Medical:  has a past medical history of Alcohol abuse, Anemia, Anxiety, Arthritis, Cervicalgia, Cirrhosis ([place]) (1994), COPD (chronic obstructive pulmonary disease) ([place]), Depression, Difficult intubation, Dyspnea, GERD (gastroesophageal reflux disease), Headache, Heart murmur, Hypertension, Other and unspecified hyperlipidemia, Status post insertion of spinal cord stimulator, and Tachycardia. Surgical: Ms. Hyslop  has a past surgical history that includes neck disc surgery; Breast enhancement surgery (1981); Esophagogastroduodenoscopy; Tonsillectomy and adenoidectomy (1973 ); Carpal tunnel release (11/11/2011); Carpal tunnel release (2013); Rotator cuff repair (06/16/2016); Rotator cuff repair; Colonoscopy with propofol (N/A, 11/01/2017); Augmentation mammaplasty (Bilateral, 1982); Anterior cervical decomp/discectomy fusion (2012, 2015, 2018); Lumbar laminectomy/decompression microdiscectomy (N/A, 08/15/2019); Spinal cord stimulator insertion (N/A, 03/17/2021); and Total hip arthroplasty (Right, 04/15/2021). Family: family history includes Alcohol abuse in her father and mother; Bipolar disorder in her mother; Pancreatic cancer in her father; Suicidality in her mother.  Laboratory Chemistry Profile   Renal Lab Results  Component Value Date   BUN 12 04/16/2021   CREATININE 0.99 04/16/2021   BCR 7 (L) 11/08/2020   GFRAA 72 05/06/2020   GFRNONAA >60 04/16/2021  Hepatic Lab Results  Component Value Date   AST 19 04/04/2021   ALT 18 04/04/2021   ALBUMIN 3.4 (L) 04/04/2021   ALKPHOS 186 (H)  04/04/2021   LIPASE 63 03/23/2018    Electrolytes Lab Results  Component Value Date   NA 139 04/16/2021   K 3.5 04/16/2021   CL 111 04/16/2021   CALCIUM 8.3 (L) 04/16/2021   PHOS 3.2 05/06/2020    Bone No results found for: VD25OH, VD125OH2TOT, WG9562ZH0, QM5784ON6, 25OHVITD1, 25OHVITD2, 25OHVITD3, TESTOFREE, TESTOSTERONE  Inflammation (CRP: Acute Phase) (ESR: Chronic Phase) No results found for: CRP, ESRSEDRATE, LATICACIDVEN       Note: Above Lab results reviewed.  Recent Imaging Review  DG HIP OPERATIVE UNILAT W OR W/O PELVIS RIGHT CLINICAL DATA:  Right hip replacement  EXAM: OPERATIVE RIGHT HIP WITH PELVIS  COMPARISON:  None.  FLUOROSCOPY TIME:  Radiation Exposure Index (as provided by the fluoroscopic device): 4.1 mGy  If the device does not provide the exposure index:  Fluoroscopy Time:  12 seconds  Number of Acquired Images:  4  FINDINGS: Right hip prosthesis is noted in satisfactory position. No acute bony or soft tissue abnormality is noted.  IMPRESSION: Status post right hip replacement.  Electronically Signed   By: Inez Catalina M.D.   On: 04/15/2021 12:52 DG HIP UNILAT W OR W/O PELVIS 2-3 VIEWS RIGHT CLINICAL DATA:  Postop right hip replacement  EXAM: DG HIP (WITH OR WITHOUT PELVIS) 2-3V RIGHT  COMPARISON:  Inferior operative images same day  FINDINGS: Postsurgical changes of right hip arthroplasty. Hardware is in normal alignment without evidence of loosening or periprosthetic fracture. Expected soft tissue changes. There is a stimulator generator overlying the right flank.  IMPRESSION: Right hip arthroplasty without evidence of immediate hardware complication.  Electronically Signed   By: Maurine Simmering M.D.   On: 04/15/2021 12:04 Note: Reviewed        Physical Exam  General appearance: Well nourished, well developed, and well hydrated. In no apparent acute distress Mental status: Alert, oriented x 3 (person, place, & time)        Respiratory: No evidence of acute respiratory distress Eyes: PERLA Vitals: BP 137/73   Pulse 81   Temp (!) 96.9 F (36.1 C) (Temporal)   Resp 16   Ht _0  (1.753 m)   Wt 165 lb (74.8 kg)   SpO2 100%   BMI 24.37 kg/m  BMI: Estimated body mass index is 24.37 kg/m as calculated from the following:   Height as of this encounter: _1  (1.753 m).   Weight as of this encounter: 165 lb (74.8 kg). Ideal: Ideal body weight: 66.2 kg (145 lb 15.1 oz) Adjusted ideal body weight: 69.7 kg (153 lb 9.1 oz)  Low back pain, pain with facet loading Right hip pain, status post right hip replacement, improved pain with weightbearing 5 out of 5 strength bilateral lower extremity: Plantar flexion, dorsiflexion, knee flexion, knee extension.   Assessment   Status Diagnosis  Controlled Controlled Controlled 1. Chronic pain syndrome   2. Spondylosis of cervical region without myelopathy or radiculopathy   3. Neuropathy   4. Failed back surgical syndrome   5. Postlaminectomy syndrome, lumbar region   6. Lumbar radiculopathy (R>L)   7. History of right hip replacement      Plan of Care   Ms. [place] has a current medication list which includes the following long-term medication(s): aripiprazole, atorvastatin, carvedilol, eszopiclone, ezetimibe, furosemide, gabapentin, ipratropium, linzess, omeprazole, potassium chloride sa,  topiramate, venlafaxine xr, and fluticasone.  Pharmacotherapy (Medications Ordered): Meds ordered this encounter  Medications   oxyCODONE-acetaminophen (PERCOCET) 5-325 MG tablet    Sig: Take 1-2 tablets by mouth daily as needed for severe pain.    Dispense:  60 tablet    Refill:  0   Continue gabapentin 600 mg 3 times a day as prescribed, Topamax 25 mg twice a day.  No refills needed. Consider caudal epidural steroid injection for lumbar radicular pain flare Consider right GTB injection for right hip pain flare Continue with lumbar paraspinal muscle  strengthening and range of motion Continue with spinal cord stimulation  Follow-up plan:   Return if symptoms worsen or fail to improve.        Recent Visits No visits were found meeting these conditions. Showing recent visits within past 90 days and meeting all other requirements Today's Visits Date Type Provider Dept  06/24/21 Office Visit [name], MD [facility]  Showing today's visits and meeting all other requirements Future Appointments No visits were found meeting these conditions. Showing future appointments within next 90 days and meeting all other requirements I discussed the assessment and treatment plan with the patient. The patient was provided an opportunity to ask questions and all were answered. The patient agreed with the plan and demonstrated an understanding of the instructions.  Patient advised to call back or seek an in-person evaluation if the symptoms or condition worsens.  Duration of encounter: 26mnutes.  Note by: BGillis Santa MD Date: 06/24/2021; Time: 8:47 AM

## 2021-06-24 NOTE — Telephone Encounter (Signed)
[facility] faxed refill request for the following medications:  busPIRone (BUSPAR) 30 MG tablet   ARIPiprazole (ABILIFY) 10 MG tablet    Please advise.

## 2021-06-25 MED ORDER — BUSPIRONE HCL 30 MG PO TABS: 30.0000 mg | ORAL_TABLET | Freq: Three times a day (TID) | ORAL | 1 refills | Status: DC

## 2021-06-25 MED ORDER — ARIPIPRAZOLE 10 MG PO TABS
10.0000 mg | ORAL_TABLET | Freq: Every day | ORAL | 1 refills | Status: DC
Start: 2021-06-25 — End: 2021-12-16

## 2021-06-26 ENCOUNTER — Telehealth: Payer: Self-pay | Admitting: Family Medicine

## 2021-06-26 ENCOUNTER — Other Ambulatory Visit: Payer: Self-pay

## 2021-06-26 ENCOUNTER — Ambulatory Visit (INDEPENDENT_AMBULATORY_CARE_PROVIDER_SITE_OTHER): Payer: PPO | Admitting: Family Medicine

## 2021-06-26 ENCOUNTER — Encounter: Payer: Self-pay | Admitting: Family Medicine

## 2021-06-26 VITALS — BP 124/80 | HR 72 | Temp 98.1°F | Resp 16 | Wt 171.1 lb

## 2021-06-26 DIAGNOSIS — E782 Mixed hyperlipidemia: Secondary | ICD-10-CM | POA: Insufficient documentation

## 2021-06-26 DIAGNOSIS — F321 Major depressive disorder, single episode, moderate: Secondary | ICD-10-CM | POA: Insufficient documentation

## 2021-06-26 DIAGNOSIS — J440 Chronic obstructive pulmonary disease with acute lower respiratory infection: Secondary | ICD-10-CM | POA: Diagnosis not present

## 2021-06-26 DIAGNOSIS — J441 Chronic obstructive pulmonary disease with (acute) exacerbation: Secondary | ICD-10-CM | POA: Insufficient documentation

## 2021-06-26 DIAGNOSIS — F5101 Primary insomnia: Secondary | ICD-10-CM | POA: Diagnosis not present

## 2021-06-26 DIAGNOSIS — D509 Iron deficiency anemia, unspecified: Secondary | ICD-10-CM | POA: Diagnosis not present

## 2021-06-26 DIAGNOSIS — J209 Acute bronchitis, unspecified: Secondary | ICD-10-CM | POA: Insufficient documentation

## 2021-06-26 DIAGNOSIS — R7309 Other abnormal glucose: Secondary | ICD-10-CM | POA: Diagnosis not present

## 2021-06-26 DIAGNOSIS — I10 Essential (primary) hypertension: Secondary | ICD-10-CM

## 2021-06-26 MED ORDER — PREDNISONE 10 MG (21) PO TBPK: ORAL_TABLET | ORAL | 0 refills | Status: DC

## 2021-06-26 MED ORDER — AZITHROMYCIN 250 MG PO TABS: ORAL_TABLET | ORAL | 0 refills | Status: AC

## 2021-06-26 MED ORDER — GUAIFENESIN-CODEINE 100-10 MG/5ML PO SOLN: 10.0000 mL | Freq: Every evening | ORAL | 0 refills | Status: DC | PRN

## 2021-06-26 NOTE — Assessment & Plan Note (Signed)
Active URI and wheeze; recommend Abx and Steroid Continue all nebs at home RTC if symptoms worsen

## 2021-06-26 NOTE — Assessment & Plan Note (Signed)
Chronic, stable Continue medication Denies CP No LE edema

## 2021-06-26 NOTE — Assessment & Plan Note (Signed)
Check labs today recommend diet low in saturated fat and regular exercise - 30 min at least 5 times per week

## 2021-06-26 NOTE — Assessment & Plan Note (Signed)
PHQ 6- stable per pt report On medications Denies SI/HI

## 2021-06-26 NOTE — Progress Notes (Signed)
Established patient visit   Patient: [name]   DOB: October 31, 1955   65 y.o. Female  MRN: [id number] Visit Date: 06/26/2021  Today's healthcare provider: Gwyneth Sprout, FNP   Chief Complaint  Patient presents with   Hypertension   Hyperlipidemia   Anxiety   Depression   Insomnia   URI   Patient made aware that she would not be able to discuss every CC today; and would need to schedule an additional OV to address problems not addressed.  Pt voiced understanding.   Subjective    HPI HPI     URI   Associated symptoms inlclude achiness, congestion, chills, cough, headache, rhinorrhea, shortness of breath, sore throat and wheezing.  Recent episode started 1 to 4 weeks ago.  The problem has been unchanged since onset.  The temperature has been with in normal range.  Patient  is drinking plenty of fluids.  Past hisotry is significant for  COPD.  Patient is not a smoker.      Last edited by Minette Headland, CMA on 06/26/2021  8:45 AM.      Hypertension, follow-up  BP Readings from Last 3 Encounters:  06/26/21 124/80  06/24/21 137/73  04/18/21 104/71   Wt Readings from Last 3 Encounters:  06/26/21 171 lb 1.6 oz (77.6 kg)  06/24/21 165 lb (74.8 kg)  04/04/21 164 lb 8 oz (74.6 kg)     She was last seen for hypertension 1 years ago.  BP at that visit was . Management since that visit includes none.  She reports excellent compliance with treatment. She is not having side effects.  She is following a Regular diet. She is not exercising. She does not smoke.  Use of agents associated with hypertension: none.   Outside blood pressures are not being checked. Symptoms: No chest pain No chest pressure  No palpitations No syncope  Yes dyspnea No orthopnea  No paroxysmal nocturnal dyspnea No lower extremity edema   Pertinent labs: Lab Results  Component Value Date   CHOL 172 04/19/2020   HDL 56 04/19/2020   LDLCALC 98 04/19/2020   TRIG 102 04/19/2020    CHOLHDL 3.1 04/19/2020   Lab Results  Component Value Date   NA 139 04/16/2021   K 3.5 04/16/2021   CREATININE 0.99 04/16/2021   GFRNONAA >60 04/16/2021   GLUCOSE 134 (H) 04/16/2021   TSH 1.310 04/17/2019     The 10-year ASCVD risk score (Arnett DK, et Gabrielle., 2019) is: 10.7%   ---------------------------------------------------------------------------------------------------  Lipid/Cholesterol, Follow-up  Last lipid panel Other pertinent labs  Lab Results  Component Value Date   CHOL 172 04/19/2020   HDL 56 04/19/2020   LDLCALC 98 04/19/2020   TRIG 102 04/19/2020   CHOLHDL 3.1 04/19/2020   Lab Results  Component Value Date   ALT 18 04/04/2021   AST 19 04/04/2021   PLT 181 04/18/2021   TSH 1.310 04/17/2019     She was last seen for this 1 years ago.  Management since that visit includes none.  She reports excellent compliance with treatment. She is not having side effects.   Symptoms: No chest pain No chest pressure/discomfort  Yes dyspnea No lower extremity edema  No numbness or tingling of extremity No orthopnea  No palpitations No paroxysmal nocturnal dyspnea  No speech difficulty No syncope   Current diet: in general, an "unhealthy" diet Current exercise: no regular exercise  The 10-year ASCVD risk score (Arnett DK, et Gabrielle.,  2019) is: 10.7%  ---------------------------------------------------------------------------------------------------  Depression, Follow-up  She  was last seen for this 1 years ago. Changes made at last visit include none.   She reports excellent compliance with treatment. She is not having side effects.   She reports good tolerance of treatment. Current symptoms include: weight gain She feels she is Unchanged since last visit.  Depression screen [facility] 2/9 06/26/2021 06/24/2021 05/23/2021  Decreased Interest 0 0 0  Down, Depressed, Hopeless 2 0 0  PHQ - 2 Score 2 0 0  Altered sleeping 2 - -  Tired, decreased energy 2 - -  Change  in appetite 0 - -  Feeling bad or failure about yourself  0 - -  Trouble concentrating 0 - -  Moving slowly or fidgety/restless 0 - -  Suicidal thoughts 0 - -  PHQ-9 Score 6 - -  Difficult doing work/chores Not difficult at all - -  Some recent data might be hidden    -----------------------------------------------------------------------------------------  Anxiety, Follow-up  She was last seen for anxiety 1 years ago. Changes made at last visit include none.   She reports excellent compliance with treatment. She reports excellent tolerance of treatment. She is not having side effects.   She feels her anxiety is mild and Unchanged since last visit.  Symptoms: No chest pain No difficulty concentrating  No dizziness Yes fatigue  No feelings of losing control No insomnia  No irritable No palpitations  No panic attacks No racing thoughts  No shortness of breath No sweating  No tremors/shakes    GAD-7 Results GAD-7 Generalized Anxiety Disorder Screening Tool 12/04/2019  1. Feeling Nervous, Anxious, or on Edge 1  2. Not Being Able to Stop or Control Worrying 2  3. Worrying Too Much About Different Things 2  4. Trouble Relaxing 1  5. Being So Restless it's Hard To Sit Still 3  6. Becoming Easily Annoyed or Irritable 0  7. Feeling Afraid As If Something Awful Might Happen 0  Total GAD-7 Score 9  Difficulty At Work, Home, or Getting  Along With Others? Not difficult at all    PHQ-9 Scores PHQ9 SCORE ONLY 06/26/2021 06/24/2021 05/23/2021  PHQ-9 Total Score 6 0 0    ---------------------------------------------------------------------------------------------------  Follow up for insomnia  The patient was last seen for this 1 years ago. Changes made at last visit include none.  She reports excellent compliance with treatment. She feels that condition is Unchanged. She is not having side effects.    -----------------------------------------------------------------------------------------   Medications: Outpatient Medications Prior to Visit  Medication Sig   ARIPiprazole (ABILIFY) 10 MG tablet Take 1 tablet (10 mg total) by mouth daily.   atorvastatin (LIPITOR) 40 MG tablet TAKE ONE TABLET BY MOUTH EVERY MORNING   busPIRone (BUSPAR) 30 MG tablet Take 1 tablet (30 mg total) by mouth 3 (three) times daily.   carvedilol (COREG) 12.5 MG tablet TAKE ONE TABLET BY MOUTH EVERY MORNING and TAKE ONE TABLET BY MOUTH EVERYDAY AT BEDTIME   Eszopiclone 3 MG TABS Take 1 tablet (3 mg total) by mouth at bedtime. Take immediately before bedtime   ezetimibe (ZETIA) 10 MG tablet TAKE ONE TABLET BY MOUTH EVERY MORNING   fluticasone (FLONASE) 50 MCG/ACT nasal spray Place 2 sprays into both nostrils daily.   gabapentin (NEURONTIN) 600 MG tablet Take 1 tablet (600 mg total) by mouth 3 (three) times daily.   Guaifenesin 1200 MG TB12 Take 1,200 mg by mouth daily as needed (congestion).   ipratropium (ATROVENT) 0.02 %  nebulizer solution Take 2.5 mLs (0.5 mg total) by nebulization 4 (four) times daily as needed for wheezing or shortness of breath.   leflunomide (ARAVA) 10 MG tablet Take 10 mg by mouth at bedtime.    LINZESS 145 MCG CAPS capsule Take 145 mcg by mouth daily before breakfast.   meloxicam (MOBIC) 15 MG tablet Take 15 mg by mouth at bedtime.   Multiple Vitamin (MULTIVITAMIN WITH MINERALS) TABS tablet Take 1 tablet by mouth daily.   Nutritional Supplements (ESTROVEN ENERGY PO) Take 1 tablet by mouth daily.    omeprazole (PRILOSEC) 40 MG capsule Take 1 capsule (40 mg total) by mouth 2 (two) times daily.   oxyCODONE-acetaminophen (PERCOCET) 5-325 MG tablet Take 1-2 tablets by mouth daily as needed for severe pain.   potassium chloride SA (KLOR-CON) 20 MEQ tablet Take 1 tablet (20 mEq total) by mouth daily with breakfast.   topiramate (TOPAMAX) 25 MG tablet Take 1 tablet (25 mg total) by mouth 2 (two)  times daily.   valACYclovir (VALTREX) 1000 MG tablet TAKE 2 TABLETS BY MOUTH TWICE DAILY AS NEEDED FOR 5 DAYS (Patient taking differently: Take 2 g by mouth See admin instructions. Take 2 g twice daily for 5 days as needed for fever blisters)   venlafaxine XR (EFFEXOR-XR) 75 MG 24 hr capsule Take 3 capsules (225 mg total) by mouth daily with breakfast. TAKE 3 CAPSULES BY MOUTH ONCE DAILY WITH BREAKFAST   vitamin B-12 (CYANOCOBALAMIN) 1000 MCG tablet Take 6,000 mcg by mouth daily.   vitamin E 400 UNIT capsule Take 1,600 Units by mouth daily.   XIIDRA 5 % SOLN Place 1 drop into both eyes daily.   [DISCONTINUED] acyclovir ointment (ZOVIRAX) 5 % Apply 1 application topically every 3 (three) hours. (Patient taking differently: Apply 1 application topically every 3 (three) hours as needed (fever blisters).)   [DISCONTINUED] furosemide (LASIX) 20 MG tablet TAKE 1 TABLET BY MOUTH ONCE DAILY AS NEEDED FOR FLUID (Patient taking differently: Take 20 mg by mouth daily as needed for edema.)   traMADol (ULTRAM) 50 MG tablet Take 1 tablet (50 mg total) by mouth every 6 (six) hours as needed. (Patient not taking: Reported on 06/26/2021)   No facility-administered medications prior to visit.    Review of Systems     Objective    BP 124/80   Pulse 72   Temp 98.1 F (36.7 C) (Oral)   Resp 16   Wt 171 lb 1.6 oz (77.6 kg)   SpO2 100%   BMI 25.27 kg/m    Physical Exam Vitals and nursing note reviewed.  Constitutional:      General: She is not in acute distress.    Appearance: Normal appearance. She is overweight. She is not ill-appearing, toxic-appearing or diaphoretic.  HENT:     Head: Normocephalic and atraumatic.     Nose:     Right Sinus: Maxillary sinus tenderness and frontal sinus tenderness present.     Left Sinus: Maxillary sinus tenderness and frontal sinus tenderness present.  Cardiovascular:     Rate and Rhythm: Normal rate and regular rhythm.     Pulses: Normal pulses.     Heart  sounds: Normal heart sounds. No murmur heard.   No friction rub. No gallop.  Pulmonary:     Effort: Pulmonary effort is normal. No respiratory distress.     Breath sounds: Decreased air movement present. No stridor. Examination of the right-upper field reveals wheezing. Examination of the left-upper field reveals wheezing. Examination of the  right-middle field reveals wheezing. Examination of the left-middle field reveals wheezing. Examination of the right-lower field reveals wheezing. Examination of the left-lower field reveals wheezing. Wheezing present. No rhonchi or rales.  Chest:     Chest wall: No tenderness.  Abdominal:     General: Bowel sounds are normal.     Palpations: Abdomen is soft.  Musculoskeletal:        General: No swelling, tenderness, deformity or signs of injury. Normal range of motion.     Right lower leg: No edema.     Left lower leg: No edema.  Skin:    General: Skin is warm and dry.     Capillary Refill: Capillary refill takes less than 2 seconds.     Coloration: Skin is not jaundiced or pale.     Findings: No bruising, erythema, lesion or rash.  Neurological:     General: No focal deficit present.     Mental Status: She is alert and oriented to person, place, and time. Mental status is at baseline.     Cranial Nerves: No cranial nerve deficit.     Sensory: No sensory deficit.     Motor: No weakness.     Coordination: Coordination normal.  Psychiatric:        Mood and Affect: Mood normal.        Behavior: Behavior normal.        Thought Content: Thought content normal.        Judgment: Judgment normal.     No results found for any visits on 06/26/21.  Assessment & Plan     Problem List Items Addressed This Visit       Cardiovascular and Mediastinum   Essential hypertension    Chronic, stable Continue medication Denies CP No LE edema        Respiratory   Acute bronchitis with COPD ([place]) - Primary    Active URI and wheeze; recommend Abx and  Steroid Continue all nebs at home RTC if symptoms worsen      Relevant Medications   azithromycin (ZITHROMAX) 250 MG tablet   predniSONE (STERAPRED UNI-PAK 21 TAB) 10 MG (21) TBPK tablet   guaiFENesin-codeine 100-10 MG/5ML syrup   COPD with acute exacerbation ([place])    Has quit smoking since this Spring Congratulated Weight gain noted since then       Relevant Medications   azithromycin (ZITHROMAX) 250 MG tablet   predniSONE (STERAPRED UNI-PAK 21 TAB) 10 MG (21) TBPK tablet   guaiFENesin-codeine 100-10 MG/5ML syrup     Other   Primary insomnia    Treat acute s/s at this time Recommend return OV if s/s continue      Elevated glucose   Relevant Orders   Hemoglobin A1c   Iron deficiency anemia    Repeat lab work Unable to address fatigue d/t acute illness      Relevant Orders   CBC with Differential/Platelet   Mixed hyperlipidemia    Check labs today recommend diet low in saturated fat and regular exercise - 30 min at least 5 times per week       Relevant Orders   Lipid Profile   Depression, major, single episode, moderate (HCC)     Return if symptoms worsen or fail to improve.      Vonna Kotyk, FNP, have reviewed all documentation for this visit. The documentation on 06/26/21 for the exam, diagnosis, procedures, and orders are all accurate and complete.    Gwyneth Sprout,  [place] [phone] (phone) [phone] (fax)  [place]

## 2021-06-26 NOTE — Telephone Encounter (Signed)
[facility] faxed refill request for the following medications:   TRELEGY AER ELLIPTA 100MCG   Please advise.

## 2021-06-26 NOTE — Assessment & Plan Note (Signed)
Treat acute s/s at this time Recommend return OV if s/s continue

## 2021-06-26 NOTE — Assessment & Plan Note (Signed)
Repeat lab work Unable to address fatigue d/t acute illness

## 2021-06-26 NOTE — Assessment & Plan Note (Signed)
Has quit smoking since this Spring Congratulated Weight gain noted since then

## 2021-06-27 LAB — CBC WITH DIFFERENTIAL/PLATELET
Basophils Absolute: 0 10*3/uL (ref 0.0–0.2)
Basos: 1 %
EOS (ABSOLUTE): 0 10*3/uL (ref 0.0–0.4)
Eos: 0 %
Hematocrit: 30.7 % — ABNORMAL LOW (ref 34.0–46.6)
Hemoglobin: 10 g/dL — ABNORMAL LOW (ref 11.1–15.9)
Immature Grans (Abs): 0 10*3/uL (ref 0.0–0.1)
Immature Granulocytes: 0 %
Lymphocytes Absolute: 1 10*3/uL (ref 0.7–3.1)
Lymphs: 29 %
MCH: 30 pg (ref 26.6–33.0)
MCHC: 32.6 g/dL (ref 31.5–35.7)
MCV: 92 fL (ref 79–97)
Monocytes Absolute: 0.5 10*3/uL (ref 0.1–0.9)
Monocytes: 14 %
Neutrophils Absolute: 1.9 10*3/uL (ref 1.4–7.0)
Neutrophils: 56 %
Platelets: 172 10*3/uL (ref 150–450)
RBC: 3.33 x10E6/uL — ABNORMAL LOW (ref 3.77–5.28)
RDW: 14.5 % (ref 11.7–15.4)
WBC: 3.4 10*3/uL (ref 3.4–10.8)

## 2021-06-27 LAB — HEMOGLOBIN A1C
Est. average glucose Bld gHb Est-mCnc: 105 mg/dL
Hgb A1c MFr Bld: 5.3 % (ref 4.8–5.6)

## 2021-06-27 LAB — LIPID PANEL
Chol/HDL Ratio: 3.1 ratio (ref 0.0–4.4)
Cholesterol, Total: 120 mg/dL (ref 100–199)
HDL: 39 mg/dL — ABNORMAL LOW (ref >39)
LDL Chol Calc (NIH): 50 mg/dL (ref 0–99)
Triglycerides: 190 mg/dL — ABNORMAL HIGH (ref 0–149)
VLDL Cholesterol Cal: 31 mg/dL (ref 5–40)

## 2021-06-27 MED ORDER — TRELEGY ELLIPTA 100-62.5-25 MCG/ACT IN AEPB: 1.0000 | INHALATION_SPRAY | Freq: Every day | RESPIRATORY_TRACT | 11 refills | Status: DC

## 2021-06-27 NOTE — Telephone Encounter (Signed)
Can you please review request, medication last filled 08/30/20 for one year but on 06/24/2021 Dr. [name] discontinued drug. Should I disregard refill request? KW  Per Tally Joe, okay to fill will send to pharmacy as medication was d/c on error. KW

## 2021-06-27 NOTE — Addendum Note (Signed)
Addended by: Minette Headland on: 06/27/2021 02:21 PM   Modules accepted: Orders

## 2021-07-01 ENCOUNTER — Encounter: Payer: Self-pay | Admitting: Family Medicine

## 2021-07-01 DIAGNOSIS — R051 Acute cough: Secondary | ICD-10-CM | POA: Insufficient documentation

## 2021-07-02 ENCOUNTER — Encounter: Payer: Self-pay | Admitting: Family Medicine

## 2021-07-02 ENCOUNTER — Ambulatory Visit
Admission: RE | Admit: 2021-07-02 | Discharge: 2021-07-02 | Disposition: A | Discharge status: Home / Self Care | Payer: PPO | Source: Ambulatory Visit | Attending: Family Medicine | Admitting: Family Medicine

## 2021-07-02 ENCOUNTER — Ambulatory Visit
Admission: RE | Admit: 2021-07-02 | Discharge: 2021-07-02 | Disposition: A | Discharge status: Home / Self Care | Payer: PPO | Attending: Family Medicine | Admitting: Family Medicine

## 2021-07-02 DIAGNOSIS — R0602 Shortness of breath: Secondary | ICD-10-CM | POA: Diagnosis not present

## 2021-07-02 DIAGNOSIS — R0789 Other chest pain: Secondary | ICD-10-CM | POA: Diagnosis not present

## 2021-07-02 DIAGNOSIS — R051 Acute cough: Secondary | ICD-10-CM | POA: Insufficient documentation

## 2021-07-02 DIAGNOSIS — R059 Cough, unspecified: Secondary | ICD-10-CM | POA: Diagnosis not present

## 2021-07-03 ENCOUNTER — Ambulatory Visit: Payer: Self-pay | Admitting: *Deleted

## 2021-07-03 NOTE — Telephone Encounter (Signed)
Since it has been 7 days and patient symptoms seem to be worse, do you want her to return back for follow up? Please see triage note below. KW

## 2021-07-03 NOTE — Telephone Encounter (Signed)
Patient calls with shortness of breath feeling worse than on 06/26/21 when last seen at [place]. Has wheeze, productive cough (thick white phlegm)and low grade fever for 2 days. Using all COPD medications as prescribed and her rescue inhaler twice daily over the last 3 days.  O2 saturation 97% RA, HR 74 bpm now.  Care advice including increase daily water intake, do deep breathing exercises often.Completed abx, prednisone and cough medication about one week ago. She is requesting further medication for the bronchitis on the most recent cxray. Explained she may need to come back in to be seen. Routing to the office for provider review.          Reason for Disposition  [1] Longstanding difficulty breathing AND [2] not responding to usual therapy  Answer Assessment - Initial Assessment Questions 1. RESPIRATORY STATUS: "Describe your breathing?" (e.g., wheezing, shortness of breath, unable to speak, severe coughing)      Couple of weeks has COPD 2. ONSET: "When did this breathing problem begin?"      2 weeks ago 3. PATTERN "Does the difficult breathing come and go, or has it been constant since it started?"      constant 4. SEVERITY: "How bad is your breathing?" (e.g., mild, moderate, severe)    - MILD: No SOB at rest, mild SOB with walking, speaks normally in sentences, can lie down, no retractions, pulse < 100.    - MODERATE: SOB at rest, SOB with minimal exertion and prefers to sit, cannot lie down flat, speaks in phrases, mild retractions, audible wheezing, pulse 100-120.    - SEVERE: Very SOB at rest, speaks in single words, struggling to breathe, sitting hunched forward, retractions, pulse > 120      Severe , feels like crap 5. RECURRENT SYMPTOM: "Have you had difficulty breathing before?" If Yes, ask: "When was the last time?" and "What happened that time?"      Yes 6. CARDIAC HISTORY: "Do you have any history of heart disease?" (e.g., heart attack, angina, bypass surgery, angioplasty)        7. LUNG HISTORY: "Do you have any history of lung disease?"  (e.g., pulmonary embolus, asthma, emphysema)     COPD 8. CAUSE: "What do you think is causing the breathing problem?"      Xray shows bronchitis 9. OTHER SYMPTOMS: "Do you have any other symptoms? (e.g., dizziness, runny nose, cough, chest pain, fever)     Low grade fever for 2 days with scan tthermometer 10. O2 SATURATION MONITOR:  "Do you use an oxygen saturation monitor (pulse oximeter) at home?" If Yes, "What is your reading (oxygen level) today?" "What is your usual oxygen saturation reading?" (e.g., 95%)        11. PREGNANCY: "Is there any chance you are pregnant?" "When was your last menstrual period?"       na 12. TRAVEL: "Have you traveled out of the country in the last month?" (e.g., travel history, exposures)       na  Protocols used: Breathing Difficulty-A-AH

## 2021-07-03 NOTE — Telephone Encounter (Signed)
Replied back to patients message on mychart with response from PCP below. KW

## 2021-07-04 ENCOUNTER — Ambulatory Visit (INDEPENDENT_AMBULATORY_CARE_PROVIDER_SITE_OTHER): Payer: PPO | Admitting: Family Medicine

## 2021-07-04 ENCOUNTER — Other Ambulatory Visit: Payer: Self-pay

## 2021-07-04 ENCOUNTER — Encounter: Payer: Self-pay | Admitting: Family Medicine

## 2021-07-04 VITALS — BP 154/81 | HR 81 | Temp 98.2°F | Resp 16 | Wt 172.2 lb

## 2021-07-04 DIAGNOSIS — J209 Acute bronchitis, unspecified: Secondary | ICD-10-CM

## 2021-07-04 DIAGNOSIS — J0140 Acute pansinusitis, unspecified: Secondary | ICD-10-CM

## 2021-07-04 DIAGNOSIS — J440 Chronic obstructive pulmonary disease with acute lower respiratory infection: Secondary | ICD-10-CM

## 2021-07-04 DIAGNOSIS — R053 Chronic cough: Secondary | ICD-10-CM | POA: Diagnosis not present

## 2021-07-04 MED ORDER — OPTICHAMBER DIAMOND MISC: 0 refills | Status: DC

## 2021-07-04 MED ORDER — ALBUTEROL SULFATE HFA 108 (90 BASE) MCG/ACT IN AERS: 2.0000 | INHALATION_SPRAY | Freq: Four times a day (QID) | RESPIRATORY_TRACT | 2 refills | Status: DC | PRN

## 2021-07-04 MED ORDER — GUAIFENESIN-CODEINE 100-10 MG/5ML PO SOLN: 10.0000 mL | Freq: Every evening | ORAL | 0 refills | Status: DC | PRN

## 2021-07-04 MED ORDER — AMOXICILLIN-POT CLAVULANATE 875-125 MG PO TABS
1.0000 | ORAL_TABLET | Freq: Two times a day (BID) | ORAL | 0 refills | Status: DC
Start: 2021-07-04 — End: 2021-07-25

## 2021-07-04 MED ORDER — GUAIFENESIN-DM 100-10 MG/5ML PO SYRP: 10.0000 mL | ORAL_SOLUTION | ORAL | 1 refills | Status: DC | PRN

## 2021-07-04 NOTE — Assessment & Plan Note (Signed)
Continue home nebs Albuterol provided Hold steroid addition at this time given recent taper

## 2021-07-04 NOTE — Progress Notes (Signed)
Established patient visit   Patient: Gabrielle Pineda   DOB: 1956/05/17   65 y.o. Female  MRN: [id number] Visit Date: 07/04/2021  Today's healthcare provider: Gwyneth Sprout, FNP   Chief Complaint  Patient presents with   Bronchitis   Subjective    HPI  Follow up for Acute Bronchitis with COPD  The patient was last seen for this 7 days ago. Changes made at last visit include start Azithromycin 250mg , Prednisone 10mg  and Guaifenesin-Codeine 100-10mg /15mL.  She reports fair compliance with treatment. She feels that condition is Unchanged. She is not having side effects.  Recent X-ray 07/02/21 Chest IMPRESSION: Central airway thickening suggesting bronchitis. No evidence of pneumonia or edema. -----------------------------------------------------------------------------------------     Medications: Outpatient Medications Prior to Visit  Medication Sig   ARIPiprazole (ABILIFY) 10 MG tablet Take 1 tablet (10 mg total) by mouth daily.   atorvastatin (LIPITOR) 40 MG tablet TAKE ONE TABLET BY MOUTH EVERY MORNING   busPIRone (BUSPAR) 30 MG tablet Take 1 tablet (30 mg total) by mouth 3 (three) times daily.   carvedilol (COREG) 12.5 MG tablet TAKE ONE TABLET BY MOUTH EVERY MORNING and TAKE ONE TABLET BY MOUTH EVERYDAY AT BEDTIME   Eszopiclone 3 MG TABS Take 1 tablet (3 mg total) by mouth at bedtime. Take immediately before bedtime   ezetimibe (ZETIA) 10 MG tablet TAKE ONE TABLET BY MOUTH EVERY MORNING   fluticasone (FLONASE) 50 MCG/ACT nasal spray Place 2 sprays into both nostrils daily.   Fluticasone-Umeclidin-Vilant (TRELEGY ELLIPTA) 100-62.5-25 MCG/ACT AEPB Inhale 1 puff into the lungs daily.   gabapentin (NEURONTIN) 600 MG tablet Take 1 tablet (600 mg total) by mouth 3 (three) times daily.   Guaifenesin 1200 MG TB12 Take 1,200 mg by mouth daily as needed (congestion).   ipratropium (ATROVENT) 0.02 % nebulizer solution Take 2.5 mLs (0.5 mg total) by nebulization 4 (four)  times daily as needed for wheezing or shortness of breath.   leflunomide (ARAVA) 10 MG tablet Take 10 mg by mouth at bedtime.    LINZESS 145 MCG CAPS capsule Take 145 mcg by mouth daily before breakfast.   meloxicam (MOBIC) 15 MG tablet Take 15 mg by mouth at bedtime.   Multiple Vitamin (MULTIVITAMIN WITH MINERALS) TABS tablet Take 1 tablet by mouth daily.   Nutritional Supplements (ESTROVEN ENERGY PO) Take 1 tablet by mouth daily.    omeprazole (PRILOSEC) 40 MG capsule Take 1 capsule (40 mg total) by mouth 2 (two) times daily.   oxyCODONE-acetaminophen (PERCOCET) 5-325 MG tablet Take 1-2 tablets by mouth daily as needed for severe pain.   potassium chloride SA (KLOR-CON) 20 MEQ tablet Take 1 tablet (20 mEq total) by mouth daily with breakfast.   predniSONE (STERAPRED UNI-PAK 21 TAB) 10 MG (21) TBPK tablet Taper.   topiramate (TOPAMAX) 25 MG tablet Take 1 tablet (25 mg total) by mouth 2 (two) times daily.   traMADol (ULTRAM) 50 MG tablet Take 1 tablet (50 mg total) by mouth every 6 (six) hours as needed.   valACYclovir (VALTREX) 1000 MG tablet TAKE 2 TABLETS BY MOUTH TWICE DAILY AS NEEDED FOR 5 DAYS (Patient taking differently: Take 2 g by mouth See admin instructions. Take 2 g twice daily for 5 days as needed for fever blisters)   venlafaxine XR (EFFEXOR-XR) 75 MG 24 hr capsule Take 3 capsules (225 mg total) by mouth daily with breakfast. TAKE 3 CAPSULES BY MOUTH ONCE DAILY WITH BREAKFAST   vitamin B-12 (CYANOCOBALAMIN) 1000 MCG tablet  Take 6,000 mcg by mouth daily.   vitamin E 400 UNIT capsule Take 1,600 Units by mouth daily.   XIIDRA 5 % SOLN Place 1 drop into both eyes daily.   [DISCONTINUED] guaiFENesin-codeine 100-10 MG/5ML syrup Take 10 mLs by mouth at bedtime and may repeat dose one time if needed.   No facility-administered medications prior to visit.    Review of Systems     Objective    BP (!) 154/81   Pulse 81   Temp 98.2 F (36.8 C) (Oral)   Resp 16   Wt 172 lb 3.2 oz  (78.1 kg)   SpO2 100%   BMI 25.43 kg/m    Physical Exam Vitals and nursing note reviewed.  Constitutional:      General: She is not in acute distress.    Appearance: Normal appearance. She is overweight. She is ill-appearing. She is not toxic-appearing or diaphoretic.  HENT:     Head: Normocephalic and atraumatic.     Jaw: There is normal jaw occlusion.     Right Ear: Hearing, tympanic membrane, ear canal and external ear normal. No swelling or tenderness. No middle ear effusion.     Left Ear: Hearing, ear canal and external ear normal. No swelling or tenderness.  No middle ear effusion.     Nose: No congestion or rhinorrhea.     Right Turbinates: Enlarged and swollen. Not pale.     Left Turbinates: Enlarged and swollen. Not pale.     Right Sinus: Maxillary sinus tenderness and frontal sinus tenderness present.     Left Sinus: Maxillary sinus tenderness and frontal sinus tenderness present.     Mouth/Throat:     Mouth: Mucous membranes are moist.     Palate: No mass and lesions.     Pharynx: Oropharynx is clear. Uvula midline. Posterior oropharyngeal erythema present. No pharyngeal swelling, oropharyngeal exudate or uvula swelling.     Tonsils: No tonsillar exudate or tonsillar abscesses.     Comments: Consistent with PND; advised of salt water gargle and hydration Cardiovascular:     Rate and Rhythm: Normal rate and regular rhythm.     Pulses: Normal pulses.     Heart sounds: Normal heart sounds. No murmur heard.   No friction rub. No gallop.  Pulmonary:     Effort: Pulmonary effort is normal. No respiratory distress.     Breath sounds: No stridor. Wheezing present. No rhonchi or rales.  Chest:     Chest wall: No tenderness.  Abdominal:     General: Bowel sounds are normal.     Palpations: Abdomen is soft.  Musculoskeletal:        General: No swelling, tenderness, deformity or signs of injury. Normal range of motion.     Cervical back: Full passive range of motion without  pain.     Right lower leg: No edema.     Left lower leg: No edema.  Lymphadenopathy:     Cervical: No cervical adenopathy.     Right cervical: No superficial, deep or posterior cervical adenopathy.    Left cervical: No superficial, deep or posterior cervical adenopathy.  Skin:    General: Skin is warm and dry.     Capillary Refill: Capillary refill takes less than 2 seconds.     Coloration: Skin is not jaundiced or pale.     Findings: No bruising, erythema, lesion or rash.  Neurological:     General: No focal deficit present.     Mental Status: She  is alert and oriented to person, place, and time. Mental status is at baseline.     Cranial Nerves: No cranial nerve deficit.     Sensory: No sensory deficit.     Motor: No weakness.     Coordination: Coordination normal.  Psychiatric:        Mood and Affect: Mood normal.        Behavior: Behavior normal.        Thought Content: Thought content normal.        Judgment: Judgment normal.    No results found for any visits on 07/04/21.  Assessment & Plan     Problem List Items Addressed This Visit       Respiratory   Acute non-recurrent pansinusitis - Primary    Encouraged fluids Report that sick family contacts- spouse and sister Recommend use of tylenol and ibuprofen as needed Denies fevers No ear involvement Continue warm drinks to soothe throat if irritated- local honey if available Repeat abx- 20#      Relevant Medications   guaiFENesin-codeine 100-10 MG/5ML syrup   guaiFENesin-dextromethorphan (ROBITUSSIN DM) 100-10 MG/5ML syrup   amoxicillin-clavulanate (AUGMENTIN) 875-125 MG tablet   Acute bronchitis with COPD ([place])    Continue home nebs Albuterol provided Hold steroid addition at this time given recent taper      Relevant Medications   albuterol (VENTOLIN HFA) 108 (90 Base) MCG/ACT inhaler   guaiFENesin-codeine 100-10 MG/5ML syrup   guaiFENesin-dextromethorphan (ROBITUSSIN DM) 100-10 MG/5ML syrup     Other    Chronic cough    Ongoing cough given recent quit in smoking Day/pm syrup provided Advised to drink 32 oz of water minimum        Return if symptoms worsen or fail to improve.     Vonna Kotyk, FNP, have reviewed all documentation for this visit. The documentation on 07/04/21 for the exam, diagnosis, procedures, and orders are all accurate and complete.    Gwyneth Sprout, [place] [phone] (phone) [phone] (fax)  [place]

## 2021-07-04 NOTE — Assessment & Plan Note (Signed)
Ongoing cough given recent quit in smoking Day/pm syrup provided Advised to drink 32 oz of water minimum

## 2021-07-04 NOTE — Assessment & Plan Note (Signed)
Encouraged fluids Report that sick family contacts- spouse and sister Recommend use of tylenol and ibuprofen as needed Denies fevers No ear involvement Continue warm drinks to soothe throat if irritated- local honey if available Repeat abx- 20#

## 2021-07-07 ENCOUNTER — Encounter
Admission: RE | Admit: 2021-07-07 | Discharge: 2021-07-07 | Disposition: A | Discharge status: Home / Self Care | Payer: PPO | Source: Ambulatory Visit | Attending: Orthopedic Surgery | Admitting: Orthopedic Surgery

## 2021-07-07 ENCOUNTER — Ambulatory Visit
Admission: RE | Admit: 2021-07-07 | Discharge: 2021-07-07 | Disposition: A | Discharge status: Home / Self Care | Payer: PPO | Source: Ambulatory Visit | Attending: Orthopedic Surgery | Admitting: Orthopedic Surgery

## 2021-07-07 ENCOUNTER — Other Ambulatory Visit: Payer: Self-pay

## 2021-07-07 DIAGNOSIS — Z471 Aftercare following joint replacement surgery: Secondary | ICD-10-CM | POA: Diagnosis not present

## 2021-07-07 DIAGNOSIS — Z96641 Presence of right artificial hip joint: Secondary | ICD-10-CM | POA: Diagnosis not present

## 2021-07-07 DIAGNOSIS — M25551 Pain in right hip: Secondary | ICD-10-CM | POA: Insufficient documentation

## 2021-07-07 MED ORDER — TECHNETIUM TC 99M MEDRONATE IV KIT
20.0000 | PACK | Freq: Once | INTRAVENOUS | Status: AC | PRN
  Administered 2021-07-07: 21.41 via INTRAVENOUS

## 2021-07-21 ENCOUNTER — Telehealth: Payer: Self-pay

## 2021-07-21 NOTE — Progress Notes (Signed)
Chronic Care Management Pharmacy Assistant   Name: Gabrielle Pineda  MRN: [id number] DOB: October 23, 1955  Reason for Encounter: Medication Review/Medication Coordination Call   Recent office visits:  07/04/2021 Tally Joe, FNP (PCP Office Visit) for Acute non-recurrent Pansinusitis-  Started: Albuterol Sulfate 109 2 puffs q6h prn, Amoxicillin-Pot Clavulanate 875-125 mg 1 tablet twice daily, dextromethorphan-guaiphenesin 100-10 mg/5ML 10 mLs q5h prn, no orders placed, no follow-up noted  06/26/2021 Tally Joe, FNP (PCP Office Visit) for Acute Bronchitis w/COPD/HTN, HLD, Anxiety- Started: Azithromycin 250 mg, Guaifenesin-Codeine 100-10 mg/5ML Oral at bedtime repeat X1 prn, Prednisone 10 mg (21) taper. Stopped: Acyclovir 5% Changed: Furosemide 20 mg to daily prn for edema, lab orders placed, no follow-up noted  Recent consult visits:  06/24/2021 [name], MD (Pain Management) for Neck Pain- Started: Oxycodone-Acetaminophen 5-325 mg 1-2 tablets daily prn, Stopped: Fluticasone-Umeclidin, No orders placed, No follow-up noted.  Hospital visits:  None in previous 6 months  Medications: Outpatient Encounter Medications as of 07/21/2021  Medication Sig   albuterol (VENTOLIN HFA) 108 (90 Base) MCG/ACT inhaler Inhale 2 puffs into the lungs every 6 (six) hours as needed for wheezing or shortness of breath.   amoxicillin-clavulanate (AUGMENTIN) 875-125 MG tablet Take 1 tablet by mouth 2 (two) times daily.   ARIPiprazole (ABILIFY) 10 MG tablet Take 1 tablet (10 mg total) by mouth daily.   atorvastatin (LIPITOR) 40 MG tablet TAKE ONE TABLET BY MOUTH EVERY MORNING   busPIRone (BUSPAR) 30 MG tablet Take 1 tablet (30 mg total) by mouth 3 (three) times daily.   carvedilol (COREG) 12.5 MG tablet TAKE ONE TABLET BY MOUTH EVERY MORNING and TAKE ONE TABLET BY MOUTH EVERYDAY AT BEDTIME   Eszopiclone 3 MG TABS Take 1 tablet (3 mg total) by mouth at bedtime. Take immediately before bedtime   ezetimibe  (ZETIA) 10 MG tablet TAKE ONE TABLET BY MOUTH EVERY MORNING   fluticasone (FLONASE) 50 MCG/ACT nasal spray Place 2 sprays into both nostrils daily.   Fluticasone-Umeclidin-Vilant (TRELEGY ELLIPTA) 100-62.5-25 MCG/ACT AEPB Inhale 1 puff into the lungs daily.   gabapentin (NEURONTIN) 600 MG tablet Take 1 tablet (600 mg total) by mouth 3 (three) times daily.   Guaifenesin 1200 MG TB12 Take 1,200 mg by mouth daily as needed (congestion).   guaiFENesin-codeine 100-10 MG/5ML syrup Take 10 mLs by mouth at bedtime and may repeat dose one time if needed.   guaiFENesin-dextromethorphan (ROBITUSSIN DM) 100-10 MG/5ML syrup Take 10 mLs by mouth every 4 (four) hours as needed for cough.   ipratropium (ATROVENT) 0.02 % nebulizer solution Take 2.5 mLs (0.5 mg total) by nebulization 4 (four) times daily as needed for wheezing or shortness of breath.   leflunomide (ARAVA) 10 MG tablet Take 10 mg by mouth at bedtime.    LINZESS 145 MCG CAPS capsule Take 145 mcg by mouth daily before breakfast.   meloxicam (MOBIC) 15 MG tablet Take 15 mg by mouth at bedtime.   Multiple Vitamin (MULTIVITAMIN WITH MINERALS) TABS tablet Take 1 tablet by mouth daily.   Nutritional Supplements (ESTROVEN ENERGY PO) Take 1 tablet by mouth daily.    omeprazole (PRILOSEC) 40 MG capsule Take 1 capsule (40 mg total) by mouth 2 (two) times daily.   oxyCODONE-acetaminophen (PERCOCET) 5-325 MG tablet Take 1-2 tablets by mouth daily as needed for severe pain.   potassium chloride SA (KLOR-CON) 20 MEQ tablet Take 1 tablet (20 mEq total) by mouth daily with breakfast.   predniSONE (STERAPRED UNI-PAK 21 TAB) 10 MG (21) TBPK tablet  Taper.   Spacer/Aero-Holding Chambers (OPTICHAMBER DIAMOND) MISC To assist PRN albuterol   topiramate (TOPAMAX) 25 MG tablet Take 1 tablet (25 mg total) by mouth 2 (two) times daily.   traMADol (ULTRAM) 50 MG tablet Take 1 tablet (50 mg total) by mouth every 6 (six) hours as needed.   valACYclovir (VALTREX) 1000 MG tablet  TAKE 2 TABLETS BY MOUTH TWICE DAILY AS NEEDED FOR 5 DAYS (Patient taking differently: Take 2 g by mouth See admin instructions. Take 2 g twice daily for 5 days as needed for fever blisters)   venlafaxine XR (EFFEXOR-XR) 75 MG 24 hr capsule Take 3 capsules (225 mg total) by mouth daily with breakfast. TAKE 3 CAPSULES BY MOUTH ONCE DAILY WITH BREAKFAST   vitamin B-12 (CYANOCOBALAMIN) 1000 MCG tablet Take 6,000 mcg by mouth daily.   vitamin E 400 UNIT capsule Take 1,600 Units by mouth daily.   XIIDRA 5 % SOLN Place 1 drop into both eyes daily.   No facility-administered encounter medications on file as of 07/21/2021.   Care Gaps: BP > 140/90 Zoster Vaccine COVID-19 Vaccine Booster 3 Influenza Vaccine Mammogram PNA Vaccine  Star Rating Drugs: Atorvastatin 40 mg last filled on 06/24/2021 for a 30-Day supply with [facility]  BP Readings from Last 3 Encounters:  07/04/21 (!) 154/81  06/26/21 124/80  06/24/21 137/73    Lab Results  Component Value Date   HGBA1C 5.3 06/26/2021    Patient obtains medications through Adherence Packaging  30 Days   Last adherence delivery included:  Meloxicam 15 mg tablet- Take one tablet by mouth once daily (Bedtime) Leflunomide 10 mg tablet- Take one tablet by mouth once daily (Bedtime) Ezetimibe (Zetia) 10 mg tablet Take one tablet by mouth daily (Breakfast) Atorvastatin 40 mg tablet- Take one tablet by mouth daily (Breakfast) Carvedilol 12.5 mg tablet - Take on tablet by mouth twice daily (Breakfast, Bedtime) Topiramate 25 mg tablet-Take one tablet by mouth twice daily (Breakfast, Bedtime) Linzess 145 mcg- Take one tablet by mouth daily (Before Breakfast) (Vials) Gabapentin 600 mg tablet- Take one tablet by mouth three times daily (Breakfast, Lunch, Bedtime) Eszopiclone 3 mg tablet - Take one tablet by mouth nightly (Bedtime) Omeprazole 40 mg- Take one capsule by mouth two times a day (Breakfast, Bedtime) Venlafaxine ER 75 mg capsule- Take  three capsules by mouth daily (Breakfast) Potassium 20 mEq - Take one capsule daily (Breakfast) Buspirone 30 mg tablet- Take one tablet by mouth three times daily (Breakfast, Lunch, Bedtime) Aripiprazole 10 mg tablet- Take one tablet by mouth daily (Breakfast) Trelegy 100-62.5-25 MCG- Inhale 1 puff daily  Patient declined (meds) last month due to PRN use/additional supply on hand. Explanation of abundance on hand (ie #30 due to overlapping fills or previous adherence issues etc)  Patient is due for next adherence delivery on: 07/30/2021 (Wednesday) [facility] . Called patient and reviewed medications and coordinated delivery.  This delivery to include: Meloxicam 15 mg tablet- Take one tablet by mouth once daily (Bedtime) Leflunomide 10 mg tablet- Take one tablet by mouth once daily (Bedtime) Ezetimibe (Zetia) 10 mg tablet Take one tablet by mouth daily (Breakfast) Atorvastatin 40 mg tablet- Take one tablet by mouth daily (Breakfast) Carvedilol 12.5 mg tablet - Take on tablet by mouth twice daily (Breakfast, Bedtime) Buspirone 30 mg tablet- Take one tablet by mouth three times daily (Breakfast, Lunch, Bedtime) Trelegy 100-62.5-25 MCG- Inhale 1 puff daily Aripiprazole 10 mg tablet- Take one tablet by mouth daily (Breakfast) Topiramate 25 mg tablet-Take one tablet  by mouth twice daily (Breakfast, Bedtime) Linzess 145 mcg- Take one tablet by mouth daily (Before Breakfast) (Vials) Potassium 20 mEq - Take one capsule daily (Breakfast) Gabapentin 600 mg tablet- Take one tablet by mouth three times daily (Breakfast, Lunch, Bedtime) Omeprazole 40 mg- Take one capsule by mouth two times a day (Breakfast, Bedtime) Venlafaxine ER 75 mg capsule- Take three capsules by mouth daily (Breakfast) Eszopiclone 3 mg tablet - Take one tablet by mouth nightly (Bedtime) Albuterol 90 mcg 2 puffs every 6 hours prn  Valacyclovir 1000 mg tablet take 2 tablets daily prn X 5 days Acyclovir 5% topical ointment apply  every 3 hours as directed  Patient declined the following medications: No medications were declined. Patient made aware that she has to have an appointment in order to get her Oxycodone. Patient advised she did not need this medication at this time.   Patient needs refills for Leflunomide 10 mg request called into Dr. Percell Boston (Rheumatology) for refill to be sent to [place],  Oxycodone-APAP 5-325 mg 1-2 tabs daily prn patient will have to schedule appointment with Dr. [name], MD for this medication no refill request over telephone is accepted in this office per nurse. Refills needed on Eszopiclone 3 mg tablet PCP medication  Confirmed delivery date of 07/30/2021 [facility], advised patient that pharmacy will contact them the morning of delivery.  Patient has a telephone appointment with Junius Argyle, CPP on 07/25/2021 @ [place], [place] Pharmacist Assistant Phone: [phone]

## 2021-07-24 ENCOUNTER — Other Ambulatory Visit: Payer: Self-pay | Admitting: Family Medicine

## 2021-07-24 ENCOUNTER — Telehealth: Payer: Self-pay

## 2021-07-24 DIAGNOSIS — F5101 Primary insomnia: Secondary | ICD-10-CM

## 2021-07-24 NOTE — Progress Notes (Signed)
    Chronic Care Management Pharmacy Assistant   Name: Gabrielle Pineda  MRN: [id number] DOB: 05/02/1956  Patient called to be reminded of her telephone appointment with Junius Argyle, CPP on 12/09 @ 0830  Patient aware of appointment date, time, and type of appointment (either telephone or in person). Patient aware to have/bring all medications, supplements, blood pressure and/or blood sugar logs to visit.  Questions: Are there any concerns you would like to discuss during your office visit? Nothing at this time  Are you having any problems obtaining your medications? No  Star Rating Drug: Atorvastatin 40 mg last filled on 06/24/2021 for a 30-Day supply with [facility]  Any gaps in medications fill history? No  Care Gaps: Zoster Vaccine COVID-19 Vaccine Influenza Vaccine Mammogram PNA Vaccine BP > 140/90   Lynann Bologna, CPA/CMA Clinical Pharmacist Assistant Phone: [phone]

## 2021-07-25 ENCOUNTER — Ambulatory Visit: Payer: PPO

## 2021-07-25 DIAGNOSIS — I10 Essential (primary) hypertension: Secondary | ICD-10-CM

## 2021-07-25 DIAGNOSIS — J418 Mixed simple and mucopurulent chronic bronchitis: Secondary | ICD-10-CM

## 2021-07-25 DIAGNOSIS — F321 Major depressive disorder, single episode, moderate: Secondary | ICD-10-CM

## 2021-07-25 NOTE — Progress Notes (Signed)
Chronic Care Management Pharmacy Note  07/25/2021 Name:  Gabrielle Pineda MRN:  [id number] DOB:  05/30/1956    Summary: Patient presents for ccm follow-up. Continues to have wheezing symptoms due to her bronchitis. Blood pressure was significantly elevated at home today. Depression symptoms unchanged.   Recommendations/Changes made from today's visit: Patient to recheck blood pressure later today and report result  Continue current medications  Plan: CPP follow-up in two months   Subjective: Gabrielle Pineda is an 65 y.o. year old female who is a primary patient of Lavon Paganini MD.  The CCM team was consulted for assistance with disease management and care coordination needs.    Engaged with patient by telephone for follow up visit in response to provider referral for pharmacy case management and/or care coordination services.   Consent to Services:  The patient was given information about Chronic Care Management services, agreed to services, and gave verbal consent prior to initiation of services.  Please see initial visit note for detailed documentation.   Patient Care Team: Gwyneth Sprout, FNP as PCP - General (Family Medicine) Minna Merritts, MD as PCP - Cardiology (Cardiology) Manya Silvas, MD (Inactive) (Gastroenterology) Germaine Pomfret, [facility] (Pharmacist)  Recent office visits: 07/04/21: Patient presented to Tally Joe, FNP for sinusitis. 06/26/21: Patient presented to Tally Joe, FNP for sinusitis.  Recent consult visits: 02/06/21: Patient presented to Dr. Jefm Bryant (Rheumatology) for follow-up. No medication changes noted.  11/20/20: Patient presented to Laurann [place], NP (Cardiology) for follow-up.   Hospital visits: 04/15/21: Patient hospitalized for hip surgery. Lovenox 40 mg daily. Ibuprofen, Meloxicam stopped.  Admitted to the hospital on 08/018/2022 due to Thoracic Spinal Cord Stimulator & Pulse generator Placement. Discharge date was  03/17/2021. Discharged from [facility] 3/16-3/20: Patient was hospitalized at [facility] for acute respiratory failure and COPD exacerbation. Continue prednisone course for 2 more days. Stopped diltiazem, Started on Carvedilol 12.5 mg twice daily due to tachycardia. QTc 607 ms, holding aripiprazole, trazodone.   Objective:  Lab Results  Component Value Date   CREATININE 0.99 04/16/2021   BUN 12 04/16/2021   GFRNONAA >60 04/16/2021   GFRAA 72 05/06/2020   NA 139 04/16/2021   K 3.5 04/16/2021   CALCIUM 8.3 (L) 04/16/2021   CO2 23 04/16/2021   GLUCOSE 134 (H) 04/16/2021    Lab Results  Component Value Date/Time   HGBA1C 5.3 06/26/2021 09:13 AM   HGBA1C 5.0 04/14/2018 11:24 AM    Last diabetic Eye exam: No results found for: HMDIABEYEEXA  Last diabetic Foot exam: No results found for: HMDIABFOOTEX   Lab Results  Component Value Date   CHOL 120 06/26/2021   HDL 39 (L) 06/26/2021   LDLCALC 50 06/26/2021   TRIG 190 (H) 06/26/2021   CHOLHDL 3.1 06/26/2021    Hepatic Function Latest Ref Rng & Units 04/04/2021 05/06/2020 04/19/2020  Total Protein 6.5 - 8.1 g/dL 7.0 - 6.4  Albumin 3.5 - 5.0 g/dL 3.4(L) 4.2 4.3  AST 15 - 41 U/L 19 - 28  ALT 0 - 44 U/L 18 - 25  Alk Phosphatase 38 - 126 U/L 186(H) - 115  Total Bilirubin 0.3 - 1.2 mg/dL 0.3 - <0.2  Bilirubin, Direct 0.00 - 0.40 mg/dL - - -    Lab Results  Component Value Date/Time   TSH 1.310 04/17/2019 10:37 AM   TSH 1.700 04/14/2018 11:24 AM    CBC Latest Ref Rng & Units 06/26/2021 04/18/2021 04/17/2021  WBC 3.4 -  10.8 x10E3/uL 3.4 7.4 7.4  Hemoglobin 11.1 - 15.9 g/dL 10.0(L) 8.0(L) 7.8(L)  Hematocrit 34.0 - 46.6 % 30.7(L) 25.1(L) 24.5(L)  Platelets 150 - 450 x10E3/uL 172 181 188    No results found for: VD25OH  Clinical ASCVD: No  The ASCVD Risk score (Arnett DK, et al., 2019) failed to calculate for the following reasons:   The valid total cholesterol range is 130 to 320 mg/dL    Depression screen  [facility] 2/9 06/26/2021 06/24/2021 05/23/2021  Decreased Interest 0 0 0  Down, Depressed, Hopeless 2 0 0  PHQ - 2 Score 2 0 0  Altered sleeping 2 - -  Tired, decreased energy 2 - -  Change in appetite 0 - -  Feeling bad or failure about yourself  0 - -  Trouble concentrating 0 - -  Moving slowly or fidgety/restless 0 - -  Suicidal thoughts 0 - -  PHQ-9 Score 6 - -  Difficult doing work/chores Not difficult at all - -  Some recent data might be hidden    Social History   Tobacco Use  Smoking Status Former   Packs/day: 1.00   Years: 40.00   Pack years: 40.00   Types: Cigarettes   Quit date: 10/30/2020   Years since quitting: 0.7  Smokeless Tobacco Never   BP Readings from Last 3 Encounters:  07/04/21 (!) 154/81  06/26/21 124/80  06/24/21 137/73   Pulse Readings from Last 3 Encounters:  07/04/21 81  06/26/21 72  06/24/21 81   Wt Readings from Last 3 Encounters:  07/04/21 172 lb 3.2 oz (78.1 kg)  06/26/21 171 lb 1.6 oz (77.6 kg)  06/24/21 165 lb (74.8 kg)   BMI Readings from Last 3 Encounters:  07/04/21 25.43 kg/m  06/26/21 25.27 kg/m  06/24/21 24.37 kg/m    Assessment/Interventions: Review of patient past medical history, allergies, medications, health status, including review of consultants reports, laboratory and other test data, was performed as part of comprehensive evaluation and provision of chronic care management services.   SDOH:  (Social Determinants of Health) assessments and interventions performed: Yes SDOH Interventions    Flowsheet Row Most Recent Value  SDOH Interventions   Financial Strain Interventions Intervention Not Indicated         SDOH Screenings   Alcohol Screen: Low Risk    Last Alcohol Screening Score (AUDIT): 0  Depression (PHQ2-9): Medium Risk   PHQ-2 Score: 6  Financial Resource Strain: Low Risk    Difficulty of Paying Living Expenses: Not hard at all  Food Insecurity: Not on file  Housing: Not on file  Physical Activity:  Not on file  Social Connections: Not on file  Stress: Not on file  Tobacco Use: Medium Risk   Smoking Tobacco Use: Former   Smokeless Tobacco Use: Never   Passive Exposure: Not on file  Transportation Needs: No Transportation Needs   Lack of Transportation (Medical): No   Lack of Transportation (Non-Medical): No    CCM Care Plan  Allergies  Allergen Reactions   Plaquenil [Hydroxychloroquine] Rash    Medications Reviewed Today     Reviewed by Minette Headland, CMA (Certified Medical Assistant) on 07/04/21 at 1034  Med List Status: <None>   Medication Order Taking? Sig Documenting Provider Last Dose Status Informant  ARIPiprazole (ABILIFY) 10 MG tablet [id number]  Take 1 tablet (10 mg total) by mouth daily. Gwyneth Sprout, FNP  Active   atorvastatin (LIPITOR) 40 MG tablet [id number]  TAKE ONE TABLET BY MOUTH  EVERY MORNING Gollan, Kathlene November, MD  Active   busPIRone (BUSPAR) 30 MG tablet [id number]  Take 1 tablet (30 mg total) by mouth 3 (three) times daily. Gwyneth Sprout, FNP  Active   carvedilol (COREG) 12.5 MG tablet [id number]  TAKE ONE TABLET BY MOUTH EVERY MORNING and TAKE ONE TABLET BY MOUTH EVERYDAY AT BEDTIME Minna Merritts, MD  Active   Eszopiclone 3 MG TABS [id number]  Take 1 tablet (3 mg total) by mouth at bedtime. Take immediately before bedtime Birdie Sons, MD  Active Self  ezetimibe (ZETIA) 10 MG tablet [id number]  TAKE ONE TABLET BY MOUTH EVERY MORNING Gollan, Kathlene November, MD  Active   fluticasone (FLONASE) 50 MCG/ACT nasal spray [id number]  Place 2 sprays into both nostrils daily. Fenton Malling M, PA-C  Active   Fluticasone-Umeclidin-Vilant (TRELEGY ELLIPTA) 100-62.5-25 MCG/ACT AEPB [id number]  Inhale 1 puff into the lungs daily. Gwyneth Sprout, FNP  Active   gabapentin (NEURONTIN) 600 MG tablet [id number]  Take 1 tablet (600 mg total) by mouth 3 (three) times daily. [name], MD  Active Self  Guaifenesin 1200 MG TB12 [id number]  Take 1,200 mg by mouth daily as  needed (congestion). [provider]  Active   guaiFENesin-codeine 100-10 MG/5ML syrup [id number]  Take 10 mLs by mouth at bedtime and may repeat dose one time if needed. Gwyneth Sprout, FNP  Active   ipratropium (ATROVENT) 0.02 % nebulizer solution [id number]  Take 2.5 mLs (0.5 mg total) by nebulization 4 (four) times daily as needed for wheezing or shortness of breath. [name], PA-C  Active Self  leflunomide (ARAVA) 10 MG tablet [id number]  Take 10 mg by mouth at bedtime.  [provider]  Active   LINZESS 145 MCG CAPS capsule [id number]  Take 145 mcg by mouth daily before breakfast. [provider]  Active   meloxicam (MOBIC) 15 MG tablet [id number]  Take 15 mg by mouth at bedtime. [provider]  Active   Multiple Vitamin (MULTIVITAMIN WITH MINERALS) TABS tablet [id number]  Take 1 tablet by mouth daily. [provider]  Active   Nutritional Supplements (ESTROVEN ENERGY PO) [id number]  Take 1 tablet by mouth daily.  [provider]  Active   omeprazole (PRILOSEC) 40 MG capsule [id number]  Take 1 capsule (40 mg total) by mouth 2 (two) times daily. Chrismon, Vickki Muff, PA-C  Active   oxyCODONE-acetaminophen (PERCOCET) 5-325 MG tablet [id number]  Take 1-2 tablets by mouth daily as needed for severe pain. [name], MD  Active   potassium chloride SA (KLOR-CON) 20 MEQ tablet [id number]  Take 1 tablet (20 mEq total) by mouth daily with breakfast. Chrismon, Vickki Muff, PA-C  Active Self  predniSONE (STERAPRED UNI-PAK 21 TAB) 10 MG (21) TBPK tablet [id number]  Taper. Gwyneth Sprout, FNP  Active   topiramate (TOPAMAX) 25 MG tablet [id number]  Take 1 tablet (25 mg total) by mouth 2 (two) times daily. [name], MD  Active   traMADol (ULTRAM) 50 MG tablet [id number]  Take 1 tablet (50 mg total) by mouth every 6 (six) hours as needed.  Patient not taking: Reported on 06/26/2021   Renata Caprice  Active   valACYclovir (VALTREX) 1000 MG tablet  [id number]  TAKE 2 TABLETS BY MOUTH TWICE DAILY AS NEEDED FOR 5 DAYS  Patient taking differently: Take 2 g by mouth See admin instructions. Take 2 g twice daily for 5 days as needed for fever blisters   Fenton Malling  M, PA-C  Active   venlafaxine XR (EFFEXOR-XR) 75 MG 24 hr capsule [id number]  Take 3 capsules (225 mg total) by mouth daily with breakfast. TAKE 3 CAPSULES BY MOUTH ONCE DAILY WITH BREAKFAST Chrismon, Vickki Muff, PA-C  Active   vitamin B-12 (CYANOCOBALAMIN) 1000 MCG tablet [id number]  Take 6,000 mcg by mouth daily. [provider]  Active   vitamin E 400 UNIT capsule [id number]  Take 1,600 Units by mouth daily. [provider]  Active   XIIDRA 5 % SOLN [id number]  Place 1 drop into both eyes daily. [provider]  Active   Med List Note Landis Martins, RN 06/24/21 6546): UDS 08/23/21            Patient Active Problem List   Diagnosis Date Noted   Chronic cough 07/04/2021   Acute cough 07/01/2021   Acute bronchitis with COPD ([place]) 06/26/2021   COPD with acute exacerbation ([place]) 06/26/2021   Primary insomnia 06/26/2021   Elevated glucose 06/26/2021   Iron deficiency anemia 06/26/2021   Mixed hyperlipidemia 06/26/2021   Depression, major, single episode, moderate ([place]) 06/26/2021   Status post total hip replacement, right 04/15/2021   Chronic radicular lumbar pain 10/03/2020   Postlaminectomy syndrome, lumbar region 06/03/2020   Acute non-recurrent pansinusitis 03/12/2020   Moderate episode of recurrent major depressive disorder ([place]) 12/04/2019   Spondylosis of cervical region without myelopathy or radiculopathy 09/21/2019   DDD (degenerative disc disease), cervical 09/21/2019   Cervicalgia 09/21/2019   Cervical fusion syndrome 09/21/2019   Chronic pain syndrome 09/21/2019   Spinal stenosis, lumbar region, with neurogenic claudication 08/15/2019   History of adenomatous polyp of colon 11/04/2017   Cervical radiculopathy 08/02/2017    Neuropathy 08/02/2017   Essential hypertension 07/28/2017   Closed compression fracture of L5 lumbar vertebra 07/07/2017   Sacral insufficiency fracture with routine healing 07/07/2017   COPD (chronic obstructive pulmonary disease) ([place]) 04/10/2015   Family history of malignant neoplasm of pancreas 04/03/2015   Hypercholesteremia 04/03/2015   Disorder of iron metabolism 04/03/2015   Weight loss 04/03/2015   Delayed onset of urination 04/03/2015   Acid reflux 10/11/2014   Closed fracture of distal phalanx of thumb 08/15/2014   Arthritis, degenerative 01/30/2014   Arthritis or polyarthritis, rheumatoid ([place]) 01/30/2014   Shortness of breath 12/22/2013   SMOKER 09/22/2010   Paroxysmal supraventricular tachycardia ([place]) 09/22/2010   CHEST PAIN UNSPECIFIED 09/22/2010   ELECTROCARDIOGRAM, ABNORMAL 09/22/2010   B-complex deficiency 09/09/2007   CN (constipation) 06/26/2007   Clinical depression 06/26/2007   Cold sore 06/26/2007   H/O alcohol abuse 06/26/2007   Cannot sleep 06/26/2007   Localized osteoarthrosis, hand 06/26/2007   Menopausal symptom 06/26/2007    Immunization History  Administered Date(s) Administered   Influenza Split 09/07/2012   Influenza,inj,Quad PF,6+ Mos 11/18/2015, 07/23/2016, 07/02/2017, 04/14/2018, 04/17/2019, 04/19/2020   Influenza-Unspecified 09/24/2014   PFIZER(Purple Top)SARS-COV-2 Vaccination 11/21/2019, 12/12/2019   Pneumococcal Polysaccharide-23 02/12/2011, 09/24/2014, 04/19/2020   Tdap 02/12/2011, 11/15/2015    Conditions to be addressed/monitored:  Hypertension, Hyperlipidemia, COPD, Depression, Tobacco use, and Insomnia, Chronic Pain, and Rheumatoid Arthritis  Care Plan : General Pharmacy (Adult)  Updates made by Germaine Pomfret, RPH since 07/25/2021 12:00 AM     Problem: Hypertension, Hyperlipidemia, COPD, Depression, Tobacco use and Chronic Pain, and Rheumatoid Arthritis   Priority: High     Long-Range Goal: Patient-Specific Goal    Start Date: 11/25/2020  Expected End Date: 03/28/2022  This Visit's Progress: On track  Recent Progress: On track  Priority: High  Note:   Current Barriers:  Unable to independently afford treatment regimen Unable to achieve control of COPD  Suboptimal therapeutic regimen for depression  Pharmacist Clinical Goal(s):  Patient will verbalize ability to afford treatment regimen achieve control of COPD as evidenced by stable breathing, avoiding cigarette use, and absence of exacerbations adhere to plan to optimize therapeutic regimen for depression as evidenced by report of adherence to recommended medication management changes through collaboration with PharmD and provider.   Interventions: 1:1 collaboration with Lavon Paganini MD regarding development and update of comprehensive plan of care as evidenced by provider attestation and co-signature Inter-disciplinary care team collaboration (see longitudinal plan of care) Comprehensive medication review performed; medication list updated in electronic medical record  Hypertension (BP goal <140/90) -Uncontrolled -Current treatment: Carvedilol 12.5 mg twice daily  Furosemide 20 mg daily as needed  -Medications previously tried: Diltiazem,   -Current home readings: NA -Blood pressure elevated this morning 191/102, but patient was struggling to get reading and utilizes wrist monitor. Patient will recheck later today and report blood pressure.  -Denies hypotensive/hypertensive symptoms -Recommended to continue current medication for now.  Hyperlipidemia: (LDL goal < 70) -Controlled -Current treatment: Atorvastatin 40 mg daily  Ezetimibe 10 mg daily  -Medications previously tried: NA  -Educated on Benefits of statin for ASCVD risk reduction; -Recommended to continue current medication  COPD (Goal: control symptoms and prevent exacerbations) -Controlled -Current treatment  Ipratropium 0.02% nebulizer 2.5 mL every 4 hours Albuterol  2 puffs every 6 hours as needed  Trelegy 1 puff daily  -Medications previously tried: NA  -Gold Grade: Gold 2 (FEV1 50-79%) -Current COPD Classification:  B (high sx, <2 exacerbations/yr) -MMRC/CAT score: NA -Pulmonary function testing: 71% (2017) -Exacerbations requiring treatment in last 6 months: yes hospitalized Mar 2022 -Frequency of rescue inhaler use: 2-3 times daily  -Recommended to continue current medication  Depression/Anxiety (Goal: Maintain symptom remission) -Not ideally controlled -Current treatment: Aripiprazole 10 mg daily  Buspirone 30 mg three times daily  Eszopiclone 3 mg at bedtime  Venlafaxine XR 75 mg 3 capsules daily -Medications previously tried/failed: NA -Patient reports unchanged mood since discontinuing sertraline. Continues to struggle with sleep quality.  -PHQ9: 6 -GAD7: 9 -Able to fall asleep within 15-20 minutes, wakes up 4-5 times nightly. 5-6 hours nightly.  -Counseled on proper sleep hygiene.   Tobacco use (Goal quit smoking) -Controlled -Previous quit attempts: Nicotine Patches (completed course)  -Current treatment  Quit Mar 2022 -Patient smokes After 30 minutes of waking -Patient triggers include: finishing a meal  -Patient continues to have triggers to smoke, but so far has been motivated to stay away from it due to her lung condition -Counseled on importance of continued cessation of tobacco products, tools for managing cravings.   Patient Goals/Self-Care Activities Patient will:  - check blood pressure weekly, document, and provide at future appointments Utilize maintenance inhaler daily  Follow Up Plan: Telephone follow up appointment with care management team member scheduled for:  09/19/2021 at 8:30 AM      Medication Assistance:  Application for Trelegy denied  Patient's preferred pharmacy is:  [facility] - [place], [place] - [address] [place]  [address] Phone: [phone] Fax: [phone]  [address], [place] [address] Phone: [phone] Fax: [phone]  Patient decided to: Utilize [facility] for medication synchronization, packaging and delivery   Care  Plan and Follow Up Patient Decision:  Patient agrees to Care Plan and Follow-up.  Plan: Telephone follow up appointment with care management team member scheduled for:  09/19/2021 at 8:30 AM  Junius Argyle, PharmD, [place], [place] [phone]

## 2021-07-25 NOTE — Patient Instructions (Signed)
Visit Information It was great speaking with you today!  Please let me know if you have any questions about our visit.   Goals Addressed             This Visit's Progress    Track and Manage My Triggers-COPD   On track    Timeframe:  Long-Range Goal Priority:  High Start Date: 11/25/2020                            Expected End Date:  05/27/2022                     Follow Up within 30 days   - avoid second hand smoke - eliminate smoking in my home - identify and remove indoor air pollutants - limit outdoor activity during cold weather    Why is this important?   Triggers are activities or things, like tobacco smoke or cold weather, that make your COPD (chronic obstructive pulmonary disease) flare-up.  Knowing these triggers helps you plan how to stay away from them.  When you cannot remove them, you can learn how to manage them.     Notes:         Patient Care Plan: General Pharmacy (Adult)     Problem Identified: Hypertension, Hyperlipidemia, COPD, Depression, Tobacco use and Chronic Pain, and Rheumatoid Arthritis   Priority: High     Long-Range Goal: Patient-Specific Goal   Start Date: 11/25/2020  Expected End Date: 03/28/2022  This Visit's Progress: On track  Recent Progress: On track  Priority: High  Note:   Current Barriers:  Unable to independently afford treatment regimen Unable to achieve control of COPD  Suboptimal therapeutic regimen for depression  Pharmacist Clinical Goal(s):  Patient will verbalize ability to afford treatment regimen achieve control of COPD as evidenced by stable breathing, avoiding cigarette use, and absence of exacerbations adhere to plan to optimize therapeutic regimen for depression as evidenced by report of adherence to recommended medication management changes through collaboration with PharmD and provider.   Interventions: 1:1 collaboration with Lavon Paganini MD regarding development and update of comprehensive plan of  care as evidenced by provider attestation and co-signature Inter-disciplinary care team collaboration (see longitudinal plan of care) Comprehensive medication review performed; medication list updated in electronic medical record  Hypertension (BP goal <140/90) -Uncontrolled -Current treatment: Carvedilol 12.5 mg twice daily  Furosemide 20 mg daily as needed  -Medications previously tried: Diltiazem,   -Current home readings: NA -Blood pressure elevated this morning 191/102, but patient was struggling to get reading and utilizes wrist monitor. Patient will recheck later today and report blood pressure.  -Denies hypotensive/hypertensive symptoms -Recommended to continue current medication for now.  Hyperlipidemia: (LDL goal < 70) -Controlled -Current treatment: Atorvastatin 40 mg daily  Ezetimibe 10 mg daily  -Medications previously tried: NA  -Educated on Benefits of statin for ASCVD risk reduction; -Recommended to continue current medication  COPD (Goal: control symptoms and prevent exacerbations) -Controlled -Current treatment  Ipratropium 0.02% nebulizer 2.5 mL every 4 hours Albuterol 2 puffs every 6 hours as needed  Trelegy 1 puff daily  -Medications previously tried: NA  -Gold Grade: Gold 2 (FEV1 50-79%) -Current COPD Classification:  B (high sx, <2 exacerbations/yr) -MMRC/CAT score: NA -Pulmonary function testing: 71% (2017) -Exacerbations requiring treatment in last 6 months: yes hospitalized Mar 2022 -Frequency of rescue inhaler use: 2-3 times daily  -Recommended to continue current medication  Depression/Anxiety (Goal:  Maintain symptom remission) -Not ideally controlled -Current treatment: Aripiprazole 10 mg daily  Buspirone 30 mg three times daily  Eszopiclone 3 mg at bedtime  Venlafaxine XR 75 mg 3 capsules daily -Medications previously tried/failed: NA -Patient reports unchanged mood since discontinuing sertraline. Continues to struggle with sleep quality.   -PHQ9: 6 -GAD7: 9 -Able to fall asleep within 15-20 minutes, wakes up 4-5 times nightly. 5-6 hours nightly.  -Counseled on proper sleep hygiene.   Tobacco use (Goal quit smoking) -Controlled -Previous quit attempts: Nicotine Patches (completed course)  -Current treatment  Quit Mar 2022 -Patient smokes After 30 minutes of waking -Patient triggers include: finishing a meal  -Patient continues to have triggers to smoke, but so far has been motivated to stay away from it due to her lung condition -Counseled on importance of continued cessation of tobacco products, tools for managing cravings.   Patient Goals/Self-Care Activities Patient will:  - check blood pressure weekly, document, and provide at future appointments Utilize maintenance inhaler daily  Follow Up Plan: Telephone follow up appointment with care management team member scheduled for:  09/19/2021 at 8:30 AM    Patient agreed to services and verbal consent obtained.   Patient verbalizes understanding of instructions provided today and agrees to view in [place].   Junius Argyle, PharmD, [place], CPP  Clinical Pharmacist Practitioner  [facility] [phone]

## 2021-07-29 MED ORDER — ESZOPICLONE 3 MG PO TABS: 3.0000 mg | ORAL_TABLET | Freq: Every day | ORAL | 0 refills | Status: DC

## 2021-07-29 NOTE — Telephone Encounter (Signed)
Pharmacy called and said that the refill request was denied on patient's Eszopiclone, stating it had previously been done.  However, the original one was printed never so it never went to the pharmacy.   Can this please be sent in again.  Thanks

## 2021-08-08 DIAGNOSIS — Z79899 Other long term (current) drug therapy: Secondary | ICD-10-CM | POA: Diagnosis not present

## 2021-08-08 DIAGNOSIS — M0579 Rheumatoid arthritis with rheumatoid factor of multiple sites without organ or systems involvement: Secondary | ICD-10-CM | POA: Diagnosis not present

## 2021-08-20 ENCOUNTER — Telehealth: Payer: Self-pay

## 2021-08-20 NOTE — Progress Notes (Signed)
Chronic Care Management Pharmacy Assistant   Name: Gabrielle Pineda  MRN: [id number] DOB: 11-11-1955  Reason for Encounter: Medication Review/Medication Coordination Call   Recent office visits:  None ID  Recent consult visits:  None ID  Hospital visits:  None in previous 6 months  Medications: Outpatient Encounter Medications as of 08/20/2021  Medication Sig   albuterol (VENTOLIN HFA) 108 (90 Base) MCG/ACT inhaler Inhale 2 puffs into the lungs every 6 (six) hours as needed for wheezing or shortness of breath.   ARIPiprazole (ABILIFY) 10 MG tablet Take 1 tablet (10 mg total) by mouth daily.   atorvastatin (LIPITOR) 40 MG tablet TAKE ONE TABLET BY MOUTH EVERY MORNING   busPIRone (BUSPAR) 30 MG tablet Take 1 tablet (30 mg total) by mouth 3 (three) times daily.   carvedilol (COREG) 12.5 MG tablet TAKE ONE TABLET BY MOUTH EVERY MORNING and TAKE ONE TABLET BY MOUTH EVERYDAY AT BEDTIME   Eszopiclone 3 MG TABS Take 1 tablet (3 mg total) by mouth at bedtime. Take immediately before bedtime   ezetimibe (ZETIA) 10 MG tablet TAKE ONE TABLET BY MOUTH EVERY MORNING   fluticasone (FLONASE) 50 MCG/ACT nasal spray Place 2 sprays into both nostrils daily.   Fluticasone-Umeclidin-Vilant (TRELEGY ELLIPTA) 100-62.5-25 MCG/ACT AEPB Inhale 1 puff into the lungs daily.   gabapentin (NEURONTIN) 600 MG tablet Take 1 tablet (600 mg total) by mouth 3 (three) times daily.   Guaifenesin 1200 MG TB12 Take 1,200 mg by mouth daily as needed (congestion).   ipratropium (ATROVENT) 0.02 % nebulizer solution Take 2.5 mLs (0.5 mg total) by nebulization 4 (four) times daily as needed for wheezing or shortness of breath.   leflunomide (ARAVA) 10 MG tablet Take 10 mg by mouth at bedtime.    LINZESS 145 MCG CAPS capsule Take 145 mcg by mouth daily before breakfast.   meloxicam (MOBIC) 15 MG tablet Take 15 mg by mouth at bedtime.   Multiple Vitamin (MULTIVITAMIN WITH MINERALS) TABS tablet Take 1 tablet by mouth daily.    Nutritional Supplements (ESTROVEN ENERGY PO) Take 1 tablet by mouth daily.    omeprazole (PRILOSEC) 40 MG capsule Take 1 capsule (40 mg total) by mouth 2 (two) times daily.   oxyCODONE-acetaminophen (PERCOCET) 5-325 MG tablet Take 1-2 tablets by mouth daily as needed for severe pain.   potassium chloride SA (KLOR-CON) 20 MEQ tablet Take 1 tablet (20 mEq total) by mouth daily with breakfast.   predniSONE (STERAPRED UNI-PAK 21 TAB) 10 MG (21) TBPK tablet Taper.   Spacer/Aero-Holding Chambers (OPTICHAMBER DIAMOND) MISC To assist PRN albuterol   topiramate (TOPAMAX) 25 MG tablet Take 1 tablet (25 mg total) by mouth 2 (two) times daily.   traMADol (ULTRAM) 50 MG tablet Take 1 tablet (50 mg total) by mouth every 6 (six) hours as needed.   valACYclovir (VALTREX) 1000 MG tablet TAKE 2 TABLETS BY MOUTH TWICE DAILY AS NEEDED FOR 5 DAYS (Patient taking differently: Take 2 g by mouth See admin instructions. Take 2 g twice daily for 5 days as needed for fever blisters)   venlafaxine XR (EFFEXOR-XR) 75 MG 24 hr capsule Take 3 capsules (225 mg total) by mouth daily with breakfast. TAKE 3 CAPSULES BY MOUTH ONCE DAILY WITH BREAKFAST   vitamin B-12 (CYANOCOBALAMIN) 1000 MCG tablet Take 6,000 mcg by mouth daily.   vitamin E 400 UNIT capsule Take 1,600 Units by mouth daily.   XIIDRA 5 % SOLN Place 1 drop into both eyes daily.   No facility-administered encounter  medications on file as of 08/20/2021.   Care Gaps: Zoster Vaccines COVID-19 Vaccine Booster 3 Influenza Vaccine Mammogram PNA Vaccine Dexa Scan BP> 140/90  Star Rating Drugs: Atorvastatin 40 mg last filled on 07/24/2021 for a 30-Day supply with [facility]  BP Readings from Last 3 Encounters:  07/04/21 (!) 154/81  06/26/21 124/80  06/24/21 137/73    Lab Results  Component Value Date   HGBA1C 5.3 06/26/2021     Patient obtains medications through Adherence Packaging  30 Days   Last adherence delivery included:  Meloxicam 15 mg  tablet- Take one tablet by mouth once daily (Bedtime) Leflunomide 10 mg tablet- Take one tablet by mouth once daily (Bedtime) Ezetimibe (Zetia) 10 mg tablet Take one tablet by mouth daily (Breakfast) Atorvastatin 40 mg tablet- Take one tablet by mouth daily (Breakfast) Carvedilol 12.5 mg tablet - Take on tablet by mouth twice daily (Breakfast, Bedtime) Buspirone 30 mg tablet- Take one tablet by mouth three times daily (Breakfast, Lunch, Bedtime) Trelegy 100-62.5-25 MCG- Inhale 1 puff daily Aripiprazole 10 mg tablet- Take one tablet by mouth daily (Breakfast) Topiramate 25 mg tablet-Take one tablet by mouth twice daily (Breakfast, Bedtime) Linzess 145 mcg- Take one tablet by mouth daily (Before Breakfast) (Vials) Potassium 20 mEq - Take one capsule daily (Breakfast) Gabapentin 600 mg tablet- Take one tablet by mouth three times daily (Breakfast, Lunch, Bedtime) Omeprazole 40 mg- Take one capsule by mouth two times a day (Breakfast, Bedtime) Venlafaxine ER 75 mg capsule- Take three capsules by mouth daily (Breakfast) Eszopiclone 3 mg tablet - Take one tablet by mouth nightly (Bedtime) Albuterol 90 mcg 2 puffs every 6 hours prn  Valacyclovir 1000 mg tablet take 2 tablets daily prn X 5 days Acyclovir 5% topical ointment apply every 3 hours as directed  Patient declined medications last month: No medications declined  Patient is due for next adherence delivery on: 08/29/2021 (Friday) [facility] after 3 pm.  Called patient and reviewed medications and coordinated delivery.  This delivery to include: Meloxicam 15 mg tablet- Take one tablet by mouth once daily (Bedtime) Leflunomide 10 mg tablet- Take one tablet by mouth once daily (Bedtime) Ezetimibe (Zetia) 10 mg tablet Take one tablet by mouth daily (Breakfast) Atorvastatin 40 mg tablet- Take one tablet by mouth daily (Breakfast) Carvedilol 12.5 mg tablet - Take on tablet by mouth twice daily (Breakfast, Bedtime) Eszopiclone 3 mg tablet -  Take one tablet by mouth nightly (Bedtime) Trelegy 100-62.5-25 MCG- Inhale 1 puff daily Topiramate 25 mg tablet-Take one tablet by mouth twice daily (Breakfast, Bedtime) Linzess 145 mcg- Take one tablet by mouth daily (Before Breakfast) (Vials) Potassium 20 mEq - Take one capsule daily (Breakfast) Gabapentin 600 mg tablet- Take one tablet by mouth three times daily (Breakfast, Lunch, Bedtime) Aripiprazole 10 mg tablet- Take one tablet by mouth daily (Breakfast) Buspirone 30 mg tablet- Take one tablet by mouth three times daily (Breakfast, Lunch, Bedtime) Omeprazole 40 mg- Take one capsule by mouth two times a day (Breakfast, Bedtime) Venlafaxine ER 75 mg capsule- Take three capsules by mouth daily (Breakfast) Albuterol 90 mcg 2 puffs every 6 hours prn   Patient declined the following medications: Valacyclovir 1000 mg tablet take 2 tablets daily prn X 5 days-PRN Medication Acyclovir 5% topical ointment apply every 3 hours as directed- PRN Medication  Patient needs refills for: No refills needed on this order  Confirmed delivery date of 08/29/2021 [facility], advised patient that pharmacy will contact them the morning of delivery.  Patient  reports that she is doing okay. I did ask her how her blood pressure has been as it has been a little elevated. Patient stated that her blood pressure is still a little elevated, but she recently ordered a arm blood pressure monitor as she currently has a wrist one. She stated CPP told her that with the wrist monitor it tends to run a little higher. She is hoping that her new BP cuff arrives this week. I advised her I would give her a few weeks, and then call her to check-in to make sure her numbers have regulated back to normal. Patient also stated that she has a new insurance. Her new insurance is through [place] [employer] Member ID number [id number]---BEMLJ Number [id number] RxBIN [id number] Rx PCN [id number] RxGRP [id number].   [facility]  has been notified of patient's new insurance  Patient has a telephone appointment with Junius Argyle, CPP on 09/19/2021 @ 0830.   Lynann Bologna, CPA/CMA Clinical Pharmacist Assistant Phone: [phone]

## 2021-08-26 ENCOUNTER — Telehealth: Payer: Self-pay

## 2021-08-26 NOTE — Progress Notes (Signed)
Per Clinical Pharmacist. please reach out to patient and reschedule her Telephone follow up on 09/19/2021.  Patient reschedule herTelephone follow up appointment with Care management team member to : 09/26/2021 at 8:30 am.  [place] Pharmacist Assistant [phone]

## 2021-09-03 ENCOUNTER — Ambulatory Visit
Payer: Medicare Other | Attending: Student in an Organized Health Care Education/Training Program | Admitting: Student in an Organized Health Care Education/Training Program

## 2021-09-03 ENCOUNTER — Encounter: Payer: Self-pay | Admitting: Student in an Organized Health Care Education/Training Program

## 2021-09-03 ENCOUNTER — Other Ambulatory Visit: Payer: Self-pay

## 2021-09-03 VITALS — BP 169/76 | HR 83 | Temp 97.0°F | Ht 69.0 in | Wt 172.0 lb

## 2021-09-03 DIAGNOSIS — G894 Chronic pain syndrome: Secondary | ICD-10-CM | POA: Insufficient documentation

## 2021-09-03 DIAGNOSIS — G629 Polyneuropathy, unspecified: Secondary | ICD-10-CM | POA: Insufficient documentation

## 2021-09-03 DIAGNOSIS — M5416 Radiculopathy, lumbar region: Secondary | ICD-10-CM | POA: Insufficient documentation

## 2021-09-03 DIAGNOSIS — M961 Postlaminectomy syndrome, not elsewhere classified: Secondary | ICD-10-CM | POA: Insufficient documentation

## 2021-09-03 DIAGNOSIS — M5030 Other cervical disc degeneration, unspecified cervical region: Secondary | ICD-10-CM | POA: Diagnosis not present

## 2021-09-03 DIAGNOSIS — Z96641 Presence of right artificial hip joint: Secondary | ICD-10-CM | POA: Diagnosis not present

## 2021-09-03 DIAGNOSIS — M47812 Spondylosis without myelopathy or radiculopathy, cervical region: Secondary | ICD-10-CM | POA: Diagnosis not present

## 2021-09-03 MED ORDER — GABAPENTIN 400 MG PO CAPS: 400.0000 mg | ORAL_CAPSULE | Freq: Three times a day (TID) | ORAL | 5 refills | Status: DC

## 2021-09-03 NOTE — Progress Notes (Addendum)
PROVIDER NOTE: Information contained herein reflects review and annotations entered in association with encounter. Interpretation of such information and data should be left to medically-trained personnel. Information provided to patient can be located elsewhere in the medical record under "Patient Instructions". Document created using STT-dictation technology, any transcriptional errors that may result from process are unintentional.    Patient: Gabrielle Pineda  Service Category: E/M  Provider: [name], MD  DOB: 07/22/1956  DOS: 09/03/2021  Specialty: Interventional Pain Management  MRN: [id number]  Setting: Ambulatory outpatient  PCP: Gwyneth Sprout, FNP  Type: Established Patient    Referring Provider: Gwyneth Sprout, FNP  Location: Office  Delivery: Face-to-face     HPI  Gabrielle Pineda, a 66 y.o. year old female, is here today because of her Chronic pain syndrome [G89.4]. Gabrielle Pineda primary complain today is Back Pain (lower) Last encounter: My last encounter with her was on 06/24/2021. Pertinent problems: Gabrielle Pineda has Closed compression fracture of L5 lumbar vertebra; Cervical radiculopathy; Neuropathy; Spinal stenosis, lumbar region, with neurogenic claudication; Spondylosis of cervical region without myelopathy or radiculopathy; Cervical fusion syndrome; Chronic pain syndrome; Postlaminectomy syndrome, lumbar region; Chronic radicular lumbar pain; and Status post total hip replacement, right on their pertinent problem list. Pain Assessment: Severity of Chronic pain is reported as a 8 /10. Location: Back Lower/pain radiaties down to her right hip. Onset: More than a month ago. Quality: Aching, Sharp, Throbbing, Stabbing, Constant. Timing: Constant. Modifying factor(s): meds. Vitals:  height is _0  (1.753 m) and weight is 172 lb (78 kg). Her temperature is 97 F (36.1 C) (abnormal). Her blood pressure is 169/76 (abnormal) and her pulse is 83. Her oxygen saturation is 98%.    Reason for encounter:  Patient follows up for medication management.  She is on gabapentin 600 mg 3 times a day.  She states that her children have told her that she is more forgetful and has more difficulty with recall.  She thinks that this could be related to her gabapentin dose.  Rather than transitioning to another medication as the patient does get benefit with gabapentin, recommend dose reduction to 400 mg 3 times a day and then assessing response.  She continues to have persistent low back and right hip pain. Refill of Percocet today, 5 mg TID prn   ROS  Constitutional: Denies any fever or chills Gastrointestinal: No reported hemesis, hematochezia, vomiting, or acute GI distress Musculoskeletal:  Low back, right hip pain Neurological: No reported episodes of acute onset apraxia, aphasia, dysarthria, agnosia, amnesia, paralysis, loss of coordination, or loss of consciousness  Medication Review  ARIPiprazole, Eszopiclone, Fluticasone-Umeclidin-Vilant, Lifitegrast, Misc Natural Products, albuterol, atorvastatin, busPIRone, carvedilol, ezetimibe, gabapentin, ipratropium, leflunomide, linaclotide, meloxicam, multivitamin with minerals, omeprazole, optichamber diamond, oxyCODONE-acetaminophen, potassium chloride SA, topiramate, venlafaxine XR, vitamin B-12, and vitamin E  History Review  Allergy: Gabrielle Pineda is allergic to plaquenil [hydroxychloroquine]. Drug: Gabrielle Pineda  reports no history of drug use. Alcohol:  reports no history of alcohol use. Tobacco:  reports that she quit smoking about 10 months ago. Her smoking use included cigarettes. She has a 40.00 pack-year smoking history. She has never used smokeless tobacco. Social: Gabrielle Pineda  reports that she quit smoking about 10 months ago. Her smoking use included cigarettes. She has a 40.00 pack-year smoking history. She has never used smokeless tobacco. She reports that she does not drink alcohol and does not use  drugs. Medical:  has a past medical history of Alcohol abuse, Anemia, Anxiety,  Arthritis, Cervicalgia, Cirrhosis ([place]) (1994), COPD (chronic obstructive pulmonary disease) ([place]), Depression, Difficult intubation, Dyspnea, GERD (gastroesophageal reflux disease), Headache, Heart murmur, Hypertension, Other and unspecified hyperlipidemia, Status post insertion of spinal cord stimulator, and Tachycardia. Surgical: Gabrielle Pineda  has a past surgical history that includes neck disc surgery; Breast enhancement surgery (1981); Esophagogastroduodenoscopy; Tonsillectomy and adenoidectomy (1973 ); Carpal tunnel release (11/11/2011); Carpal tunnel release (2013); Rotator cuff repair (06/16/2016); Rotator cuff repair; Colonoscopy with propofol (N/A, 11/01/2017); Augmentation mammaplasty (Bilateral, 1982); Anterior cervical decomp/discectomy fusion (2012, 2015, 2018); Lumbar laminectomy/decompression microdiscectomy (N/A, 08/15/2019); Spinal cord stimulator insertion (N/A, 03/17/2021); and Total hip arthroplasty (Right, 04/15/2021). Family: family history includes Alcohol abuse in her father and mother; Bipolar disorder in her mother; Pancreatic cancer in her father; Suicidality in her mother.  Laboratory Chemistry Profile   Renal Lab Results  Component Value Date   BUN 12 04/16/2021   CREATININE 0.99 04/16/2021   BCR 7 (L) 11/08/2020   GFRAA 72 05/06/2020   GFRNONAA >60 04/16/2021    Hepatic Lab Results  Component Value Date   AST 19 04/04/2021   ALT 18 04/04/2021   ALBUMIN 3.4 (L) 04/04/2021   ALKPHOS 186 (H) 04/04/2021   LIPASE 63 03/23/2018    Electrolytes Lab Results  Component Value Date   NA 139 04/16/2021   K 3.5 04/16/2021   CL 111 04/16/2021   CALCIUM 8.3 (L) 04/16/2021   PHOS 3.2 05/06/2020    Bone No results found for: VD25OH, VD125OH2TOT, NU2725DG6, YQ0347QQ5, 25OHVITD1, 25OHVITD2, 25OHVITD3, TESTOFREE, TESTOSTERONE  Inflammation (CRP: Acute Phase) (ESR: Chronic Phase) No results found  for: CRP, ESRSEDRATE, LATICACIDVEN       Note: Above Lab results reviewed.  Recent Imaging Review  NM Bone Scan 3 Phase CLINICAL DATA:  RIGHT hip replacement. RIGHT hip pain. Hip replacement August 22  EXAM: NUCLEAR MEDICINE 3-PHASE BONE SCAN  TECHNIQUE: Radionuclide angiographic images, immediate static blood pool images, and 3-hour delayed static images were obtained of the hips after intravenous injection of radiopharmaceutical.  RADIOPHARMACEUTICALS:  21.4 mCi Tc-64mMDP IV  COMPARISON:  None.  FINDINGS: Vascular phase: No asymmetric or increased blood flow to the LEFT or RIGHT hip.  Blood pool phase: Increased blood pool activity in the soft tissues about the RIGHT greater trochanter region.  Delayed phase: Moderate diffuse radiotracer accumulation associated with the proximal RIGHT femur surrounding the femoral stem of the RIGHT hip prosthetic. More mild uptake in the RIGHT acetabulum. No focal uptake.  IMPRESSION: Moderate diffuse uptake within the proximal RIGHT femur surrounding the prosthetic . No hypervascular activity to suggest infection. No focality.  Electronically Signed   By: SSuzy BouchardM.D.   On: 07/08/2021 15:40 Note: Reviewed        Physical Exam  General appearance: Well nourished, well developed, and well hydrated. In no apparent acute distress Mental status: Alert, oriented x 3 (person, place, & time)       Respiratory: No evidence of acute respiratory distress Eyes: PERLA Vitals: BP (!) 169/76    Pulse 83    Temp (!) 97 F (36.1 C)    Ht _0  (1.753 m)    Wt 172 lb (78 kg)    SpO2 98%    BMI 25.40 kg/m  BMI: Estimated body mass index is 25.4 kg/m as calculated from the following:   Height as of this encounter: _1  (1.753 m).   Weight as of this encounter: 172 lb (78 kg). Ideal: Ideal body weight: 66.2 kg (145 lb 15.1  oz) Adjusted ideal body weight: 70.9 kg (156 lb 5.9 oz)    Lumbar Spine Area Exam  Skin & Axial Inspection:  Well healed scar from previous spine surgery detected Alignment: Symmetrical Functional ROM: Pain restricted ROM affecting both sides Stability: No instability detected Muscle Tone/Strength: Functionally intact. No obvious neuro-muscular anomalies detected. Sensory (Neurological): Dermatomal pain pattern Palpation: No palpable anomalies       Provocative Tests: Hyperextension/rotation test: deferred today       Lumbar quadrant test (Kemp's test): (+) bilateral for foraminal stenosis right greater than left Lateral bending test: (+) ipsilateral radicular pain, bilaterally. Positive for bilateral foraminal stenosis.  Right greater than left  Gait & Posture Assessment  Ambulation: Limited Gait: Antalgic Posture: Difficulty standing up straight, due to pain  Lower Extremity Exam      Side: Right lower extremity   Side: Left lower extremity  Stability: No instability observed           Stability: No instability observed          Skin & Extremity Inspection: Skin color, temperature, and hair growth are WNL. No peripheral edema or cyanosis. No masses, redness, swelling, asymmetry, or associated skin lesions. No contractures.   Skin & Extremity Inspection: Skin color, temperature, and hair growth are WNL. No peripheral edema or cyanosis. No masses, redness, swelling, asymmetry, or associated skin lesions. No contractures.  Functional ROM: Pain restricted ROM for hip and knee joints Limited SLR (straight leg raise)   Functional ROM: Pain restricted ROM for hip and knee joints Limited SLR (straight leg raise)  Muscle Tone/Strength: Functionally intact. No obvious neuro-muscular anomalies detected.   Muscle Tone/Strength: Functionally intact. No obvious neuro-muscular anomalies detected.  Sensory (Neurological): Dermatomal pain pattern         Sensory (Neurological): Dermatomal pain pattern        DTR: Patellar: deferred today Achilles: deferred today Plantar: deferred today   DTR: Patellar:  deferred today Achilles: deferred today Plantar: deferred today  Palpation: No palpable anomalies   Palpation: No palpable anomalies     Assessment   Status Diagnosis  Controlled Controlled Controlled 1. Chronic pain syndrome   2. Spondylosis of cervical region without myelopathy or radiculopathy   3. Neuropathy   4. Failed back surgical syndrome   5. Postlaminectomy syndrome, lumbar region   6. DDD (degenerative disc disease), cervical   7. Lumbar radiculopathy (R>L)   8. History of right hip replacement      Updated Problems: No problems updated.   Plan of Care  Problem-specific:  No problem-specific Assessment & Plan notes found for this encounter.  Gabrielle Pineda has a current medication list which includes the following long-term medication(s): albuterol, aripiprazole, atorvastatin, carvedilol, eszopiclone, ezetimibe, gabapentin, ipratropium, linzess, omeprazole, potassium chloride sa, optichamber diamond, topiramate, and venlafaxine xr.  Pharmacotherapy (Medications Ordered): Meds ordered this encounter  Medications   gabapentin (NEURONTIN) 400 MG capsule    Sig: Take 1 capsule (400 mg total) by mouth 3 (three) times daily.    Dispense:  90 capsule    Refill:  5   oxyCODONE-acetaminophen (PERCOCET) 5-325 MG tablet    Sig: Take 1 tablet by mouth every 8 (eight) hours as needed for severe pain. Must last 30 days.    Dispense:  90 tablet    Refill:  0    Chronic Pain: STOP Act (Not applicable) Fill 1 day early if closed on refill date. Avoid benzodiazepines within 8 hours of opioids   Orders:  No  orders of the defined types were placed in this encounter.  Follow-up plan:   Return in about 4 weeks (around 10/01/2021) for Medication Management, virtual (assess Gabapentin decrease).    Recent Visits Date Type Provider Dept  09/03/21 Office Visit [name], MD [facility]  06/24/21 Office Visit [name], MD [facility]  Showing  recent visits within past 90 days and meeting all other requirements Future Appointments Date Type Provider Dept  10/02/21 Appointment [name], MD [facility]  Showing future appointments within next 90 days and meeting all other requirements  I discussed the assessment and treatment plan with the patient. The patient was provided an opportunity to ask questions and all were answered. The patient agreed with the plan and demonstrated an understanding of the instructions.  Patient advised to call back or seek an in-person evaluation if the symptoms or condition worsens.  Duration of encounter: 76mnutes.  Note by: BGillis Santa MD Date: 09/03/2021; Time: 2:46 PM

## 2021-09-03 NOTE — Progress Notes (Signed)
Safety precautions to be maintained throughout the outpatient stay will include: orient to surroundings, keep bed in low position, maintain call bell within reach at all times, provide assistance with transfer out of bed and ambulation.  

## 2021-09-04 ENCOUNTER — Telehealth: Payer: Self-pay | Admitting: Student in an Organized Health Care Education/Training Program

## 2021-09-04 MED ORDER — OXYCODONE-ACETAMINOPHEN 5-325 MG PO TABS: 1.0000 | ORAL_TABLET | Freq: Three times a day (TID) | ORAL | 0 refills | Status: AC | PRN

## 2021-09-04 NOTE — Telephone Encounter (Signed)
Last script for Oxy/Acet 06-24-21, but she had appt yesterday.

## 2021-09-04 NOTE — Addendum Note (Signed)
Addended by: [name] on: 09/04/2021 02:47 PM   Modules accepted: Orders

## 2021-09-11 DIAGNOSIS — M159 Polyosteoarthritis, unspecified: Secondary | ICD-10-CM | POA: Diagnosis not present

## 2021-09-11 DIAGNOSIS — M19041 Primary osteoarthritis, right hand: Secondary | ICD-10-CM | POA: Diagnosis not present

## 2021-09-11 DIAGNOSIS — M19042 Primary osteoarthritis, left hand: Secondary | ICD-10-CM | POA: Diagnosis not present

## 2021-09-11 DIAGNOSIS — M0579 Rheumatoid arthritis with rheumatoid factor of multiple sites without organ or systems involvement: Secondary | ICD-10-CM | POA: Diagnosis not present

## 2021-09-11 DIAGNOSIS — Z79899 Other long term (current) drug therapy: Secondary | ICD-10-CM | POA: Diagnosis not present

## 2021-09-15 ENCOUNTER — Other Ambulatory Visit: Payer: Self-pay | Admitting: Physician Assistant

## 2021-09-15 DIAGNOSIS — B001 Herpesviral vesicular dermatitis: Secondary | ICD-10-CM

## 2021-09-18 ENCOUNTER — Telehealth: Payer: Self-pay

## 2021-09-18 NOTE — Progress Notes (Signed)
Chronic Care Management Pharmacy Assistant   Name: Gabrielle Pineda  MRN: [id number] DOB: [birth year]  Reason for Encounter: Medication Review/Medication Coordination   Recent office visits:  None ID  Recent consult visits:  09/11/2021 Gabrielle Matas, MD (Rheumatology) I am unable to see the note  09/03/2021 Gabrielle Santa, MD (Pain Management) for Back Pain- Stopped: Fluticasone Propionate 50 mcg, Guaifenesin 1200 mg, Oxycodone 15 mg, Prednisone 10 mg, Tramadol HCl 50 mg, Valacyclovir 1000 mg, Changed: Gabapentin from 600 mg 3 X Daily to 400 mg 3 X Daily, Started: Oxycodone-Acetaminophen 5-325 mg 1 tablet q8h prn, no order placed, patient instructed to return in 4 weeks.  Hospital visits:  None in previous 6 months  Medications: Outpatient Encounter Medications as of 09/18/2021  Medication Sig   albuterol (VENTOLIN HFA) 108 (90 Base) MCG/ACT inhaler Inhale 2 puffs into the lungs every 6 (six) hours as needed for wheezing or shortness of breath.   ARIPiprazole (ABILIFY) 10 MG tablet Take 1 tablet (10 mg total) by mouth daily.   atorvastatin (LIPITOR) 40 MG tablet TAKE ONE TABLET BY MOUTH EVERY MORNING   busPIRone (BUSPAR) 30 MG tablet Take 1 tablet (30 mg total) by mouth 3 (three) times daily.   carvedilol (COREG) 12.5 MG tablet TAKE ONE TABLET BY MOUTH EVERY MORNING and TAKE ONE TABLET BY MOUTH EVERYDAY AT BEDTIME   Eszopiclone 3 MG TABS Take 1 tablet (3 mg total) by mouth at bedtime. Take immediately before bedtime   ezetimibe (ZETIA) 10 MG tablet TAKE ONE TABLET BY MOUTH EVERY MORNING   Fluticasone-Umeclidin-Vilant (TRELEGY ELLIPTA) 100-62.5-25 MCG/ACT AEPB Inhale 1 puff into the lungs daily.   gabapentin (NEURONTIN) 400 MG capsule Take 1 capsule (400 mg total) by mouth 3 (three) times daily.   ipratropium (ATROVENT) 0.02 % nebulizer solution Take 2.5 mLs (0.5 mg total) by nebulization 4 (four) times daily as needed for wheezing or shortness of breath.   leflunomide  (ARAVA) 10 MG tablet Take 10 mg by mouth at bedtime.    LINZESS 145 MCG CAPS capsule Take 145 mcg by mouth daily before breakfast.   meloxicam (MOBIC) 15 MG tablet Take 15 mg by mouth at bedtime.   Multiple Vitamin (MULTIVITAMIN WITH MINERALS) TABS tablet Take 1 tablet by mouth daily.   Nutritional Supplements (ESTROVEN ENERGY PO) Take 1 tablet by mouth daily.    omeprazole (PRILOSEC) 40 MG capsule Take 1 capsule (40 mg total) by mouth 2 (two) times daily.   oxyCODONE-acetaminophen (PERCOCET) 5-325 MG tablet Take 1 tablet by mouth every 8 (eight) hours as needed for severe pain. Must last 30 days.   potassium chloride SA (KLOR-CON) 20 MEQ tablet Take 1 tablet (20 mEq total) by mouth daily with breakfast.   Spacer/Aero-Holding Chambers (OPTICHAMBER DIAMOND) MISC To assist PRN albuterol   topiramate (TOPAMAX) 25 MG tablet Take 1 tablet (25 mg total) by mouth 2 (two) times daily.   valACYclovir (VALTREX) 1000 MG tablet Take 2 tablets (2,000 mg total) by mouth See admin instructions. Take 2 g twice daily for 5 days as needed for fever blisters   venlafaxine XR (EFFEXOR-XR) 75 MG 24 hr capsule Take 3 capsules (225 mg total) by mouth daily with breakfast. TAKE 3 CAPSULES BY MOUTH ONCE DAILY WITH BREAKFAST   vitamin B-12 (CYANOCOBALAMIN) 1000 MCG tablet Take 6,000 mcg by mouth daily.   vitamin E 400 UNIT capsule Take 1,600 Units by mouth daily.   XIIDRA 5 % SOLN Place 1 drop into both eyes daily.  No facility-administered encounter medications on file as of 09/18/2021.   Care Gaps: Zoster Vaccines COVID-19 Vaccine Booster 3 Influenza Vaccine Mammogram PNA Vaccine Dexa Scan BP> 140/90  Star Rating Drugs: Atorvastatin 40 mg last filled on 07/24/2021 for a 30-Day supply with [facility]  BP Readings from Last 3 Encounters:  09/03/21 (!) 169/76  07/04/21 (!) 154/81  06/26/21 124/80    Lab Results  Component Value Date   HGBA1C 5.3 06/26/2021     Patient obtains medications through  Adherence Packaging  30 Days   Last adherence delivery included:  Meloxicam 15 mg tablet- Take one tablet by mouth once daily (Bedtime) Leflunomide 10 mg tablet- Take one tablet by mouth once daily (Bedtime) Ezetimibe (Zetia) 10 mg tablet Take one tablet by mouth daily (Breakfast) Atorvastatin 40 mg tablet- Take one tablet by mouth daily (Breakfast) Carvedilol 12.5 mg tablet - Take on tablet by mouth twice daily (Breakfast, Bedtime) Eszopiclone 3 mg tablet - Take one tablet by mouth nightly (Bedtime) Trelegy 100-62.5-25 MCG- Inhale 1 puff daily Topiramate 25 mg tablet-Take one tablet by mouth twice daily (Breakfast, Bedtime) Linzess 145 mcg- Take one tablet by mouth daily (Before Breakfast) (Vials) Potassium 20 mEq - Take one capsule daily (Breakfast) Gabapentin 600 mg tablet- Take one tablet by mouth three times daily (Breakfast, Lunch, Bedtime) Aripiprazole 10 mg tablet- Take one tablet by mouth daily (Breakfast) Buspirone 30 mg tablet- Take one tablet by mouth three times daily (Breakfast, Lunch, Bedtime) Omeprazole 40 mg- Take one capsule by mouth two times a day (Breakfast, Bedtime) Venlafaxine ER 75 mg capsule- Take three capsules by mouth daily (Breakfast) Albuterol 90 mcg 2 puffs every 6 hours prn   Patient declined medications last month: Valacyclovir 1000 mg tablet take 2 tablets daily prn X 5 days-PRN Medication Acyclovir 5% topical ointment apply every 3 hours as directed- PRN Medication  Patient is due for next adherence delivery on: 09/30/2021 (Tuesday) [facility] . Called patient and reviewed medications and coordinated delivery.  This delivery to include: Meloxicam 15 mg tablet- Take one tablet by mouth once daily (Bedtime) Leflunomide 10 mg tablet- Take one tablet by mouth once daily (Bedtime) Ezetimibe (Zetia) 10 mg tablet Take one tablet by mouth daily (Breakfast) Atorvastatin 40 mg tablet- Take one tablet by mouth daily (Breakfast) Carvedilol 12.5 mg tablet - Take  on tablet by mouth twice daily (Breakfast, Bedtime) Eszopiclone 3 mg tablet - Take one tablet by mouth nightly (Bedtime) Trelegy 100-62.5-25 MCG- Inhale 1 puff daily Topiramate 25 mg tablet-Take one tablet by mouth twice daily (Breakfast, Bedtime) Linzess 145 mcg- Take one tablet by mouth daily (Before Breakfast) (Vials) Potassium 20 mEq - Take one capsule daily (Breakfast) Gabapentin 400 mg tablet- Take one tablet by mouth three times daily (Breakfast, Lunch, Bedtime) Aripiprazole 10 mg tablet- Take one tablet by mouth daily (Breakfast) Buspirone 30 mg tablet- Take one tablet by mouth three times daily (Breakfast, Lunch, Bedtime) Omeprazole 40 mg- Take one capsule by mouth two times a day (Breakfast, Bedtime) Venlafaxine ER 75 mg capsule- Take three capsules by mouth daily (Breakfast) Albuterol 90 mcg 2 puffs every 6 hours prn   Patient declined the following medications: Patient did not decline any of the above medications  Patient needs refills for Albuterol Inhaler. CPP can send refill for this medication  Confirmed delivery date of 09/30/2021 [facility], advised patient that pharmacy will contact them the morning of delivery.  Spoke with the patient, and she reports she is doing well. She reports  that she has gotten a blood pressure machine where she uses the cuff on arm vs wrist. She reports having good numbers at home she stated less than 140/90, but was unable to give me an accurate number. Patient had an appointment at Pain management last month and her BP was elevated, but that could have been due to pain. Patient has no concerns or issues at this time.   Patient has a telephone appointment with Junius Argyle, CPP on 09/26/2021 @ [place] [place] Pharmacist Assistant Phone: [phone]

## 2021-09-19 ENCOUNTER — Telehealth: Payer: PPO

## 2021-09-25 ENCOUNTER — Other Ambulatory Visit: Payer: Self-pay | Admitting: Family Medicine

## 2021-09-25 ENCOUNTER — Telehealth: Payer: Self-pay

## 2021-09-25 DIAGNOSIS — J440 Chronic obstructive pulmonary disease with acute lower respiratory infection: Secondary | ICD-10-CM

## 2021-09-25 DIAGNOSIS — J209 Acute bronchitis, unspecified: Secondary | ICD-10-CM

## 2021-09-25 NOTE — Telephone Encounter (Signed)
Requested Prescriptions  Pending Prescriptions Disp Refills   albuterol (VENTOLIN HFA) 108 (90 Base) MCG/ACT inhaler [Pharmacy Med Name: albuterol sulfate HFA 90 mcg/actuation aerosol inhaler] 8.5 g 2    Sig: INHALE TWO PUFFS BY MOUTH INTO LUNGS every SIX hours AS NEEDED FOR WHEEZING AND/OR SHORTNESS OF BREATH     Pulmonology:  Beta Agonists 2 Failed - 09/25/2021  3:00 PM      Failed - Last BP in normal range    BP Readings from Last 1 Encounters:  09/03/21 (!) 169/76         Passed - Last Heart Rate in normal range    Pulse Readings from Last 1 Encounters:  09/03/21 83         Passed - Valid encounter within last 12 months    Recent Outpatient Visits          2 months ago Acute non-recurrent pansinusitis   [facility] T[facility] Joe T, FNP   3 months ago Acute bronchitis with [facility])   [facility] Gwyneth Sprout, FNP   9 months ago Primary insomnia   [place], PA-C   10 months ago Hypokalemia   [facility] [place], [place]arnce Sorrel, [place]   1 year ago Acute non-recurrent pansinusitis   [facility] [place], [facility], [place]

## 2021-09-25 NOTE — Progress Notes (Signed)
° ° °  Chronic Care Management Pharmacy Assistant   Name: Gabrielle Pineda  MRN: [id number] DOB: 1956-05-13  Patient called to be reminded of her telephone appointment with Junius Argyle, CPP on 09/26/2021 @ 0830  No answer, and patient's voicemail is not setup so I was unable to leave a message reminding the patient about her appointment. Patient does have the ability to text, and has provided me with an okay to text her for little things so I did send a quick text reminding her about her appointment.   Star Rating Drug: Atorvastatin 40 mg last filled on 08/26/2021 for a 30-Day supply with [facility]  Any gaps in medications fill history? No  Care Gaps: Zoster Vaccines COVID-19 Vaccine Booster 3 Influenza Vaccine Mammogram PNA Vaccine Dexa Scan BP> 140/90  Lynann Bologna, CPA/CMA Clinical Pharmacist Assistant Phone: [phone]

## 2021-09-26 ENCOUNTER — Ambulatory Visit (INDEPENDENT_AMBULATORY_CARE_PROVIDER_SITE_OTHER): Payer: Medicare Other

## 2021-09-26 DIAGNOSIS — I10 Essential (primary) hypertension: Secondary | ICD-10-CM

## 2021-09-26 DIAGNOSIS — F321 Major depressive disorder, single episode, moderate: Secondary | ICD-10-CM

## 2021-09-26 NOTE — Progress Notes (Signed)
Chronic Care Management Pharmacy Note  10/15/2021 Name:  Gabrielle Pineda MRN:  [id number] DOB:  05/18/1956    Summary: Patient presents for ccm follow-up.   Recommendations/Changes made from today's visit: Continue current medications  Plan: CPP follow-up in two months   Subjective: Gabrielle Pineda is an 66 y.o. year old female who is a primary patient of Lavon Paganini MD.  The CCM team was consulted for assistance with disease management and care coordination needs.    Engaged with patient by telephone for follow up visit in response to provider referral for pharmacy case management and/or care coordination services.   Consent to Services:  The patient was given information about Chronic Care Management services, agreed to services, and gave verbal consent prior to initiation of services.  Please see initial visit note for detailed documentation.   Patient Care Team: Gwyneth Sprout, FNP as PCP - General (Family Medicine) Minna Merritts, MD as PCP - Cardiology (Cardiology) Manya Silvas, MD (Inactive) (Gastroenterology) Germaine Pomfret, [facility] (Pharmacist)  Recent office visits: 07/04/21: Patient presented to Tally Joe, FNP for sinusitis. 06/26/21: Patient presented to Tally Joe, FNP for sinusitis.  Recent consult visits: 09/11/21: Patient presented to Dr. Jefm Bryant (Rheumatology) for follow-up. Consider increasing Arava to 20 mg daily  02/06/21: Patient presented to Dr. Jefm Bryant (Rheumatology) for follow-up. No medication changes noted.  11/20/20: Patient presented to Laurann Montana, NP (Cardiology) for follow-up.   Hospital visits: 04/15/21: Patient hospitalized for hip surgery. Lovenox 40 mg daily. Ibuprofen, Meloxicam stopped.  Admitted to the hospital on 08/018/2022 due to Thoracic Spinal Cord Stimulator & Pulse generator Placement. Discharge date was 03/17/2021. Discharged from [facility]  Objective:  Lab Results   Component Value Date   CREATININE 0.99 04/16/2021   BUN 12 04/16/2021   GFRNONAA >60 04/16/2021   GFRAA 72 05/06/2020   NA 139 04/16/2021   K 3.5 04/16/2021   CALCIUM 8.3 (L) 04/16/2021   CO2 23 04/16/2021   GLUCOSE 134 (H) 04/16/2021    Lab Results  Component Value Date/Time   HGBA1C 5.3 06/26/2021 09:13 AM   HGBA1C 5.0 04/14/2018 11:24 AM    Last diabetic Eye exam: No results found for: HMDIABEYEEXA  Last diabetic Foot exam: No results found for: HMDIABFOOTEX   Lab Results  Component Value Date   CHOL 120 06/26/2021   HDL 39 (L) 06/26/2021   LDLCALC 50 06/26/2021   TRIG 190 (H) 06/26/2021   CHOLHDL 3.1 06/26/2021    Hepatic Function Latest Ref Rng & Units 04/04/2021 05/06/2020 04/19/2020  Total Protein 6.5 - 8.1 g/dL 7.0 - 6.4  Albumin 3.5 - 5.0 g/dL 3.4(L) 4.2 4.3  AST 15 - 41 U/L 19 - 28  ALT 0 - 44 U/L 18 - 25  Alk Phosphatase 38 - 126 U/L 186(H) - 115  Total Bilirubin 0.3 - 1.2 mg/dL 0.3 - <0.2  Bilirubin, Direct 0.00 - 0.40 mg/dL - - -    Lab Results  Component Value Date/Time   TSH 1.310 04/17/2019 10:37 AM   TSH 1.700 04/14/2018 11:24 AM    CBC Latest Ref Rng & Units 06/26/2021 04/18/2021 04/17/2021  WBC 3.4 - 10.8 x10E3/uL 3.4 7.4 7.4  Hemoglobin 11.1 - 15.9 g/dL 10.0(L) 8.0(L) 7.8(L)  Hematocrit 34.0 - 46.6 % 30.7(L) 25.1(L) 24.5(L)  Platelets 150 - 450 x10E3/uL 172 181 188    No results found for: VD25OH  Clinical ASCVD: No  The ASCVD Risk score (Arnett DK, et al.,  2019) failed to calculate for the following reasons:   The valid total cholesterol range is 130 to 320 mg/dL    Depression screen [facility] 2/9 10/01/2021 06/26/2021 06/24/2021  Decreased Interest 0 0 0  Down, Depressed, Hopeless 0 2 0  PHQ - 2 Score 0 2 0  Altered sleeping 0 2 -  Tired, decreased energy 0 2 -  Change in appetite 0 0 -  Feeling bad or failure about yourself  0 0 -  Trouble concentrating 0 0 -  Moving slowly or fidgety/restless 0 0 -  Suicidal thoughts 0 0 -  PHQ-9 Score 0  6 -  Difficult doing work/chores Not difficult at all Not difficult at all -  Some recent data might be hidden    Social History   Tobacco Use  Smoking Status Former   Packs/day: 1.00   Years: 40.00   Pack years: 40.00   Types: Cigarettes   Quit date: 10/30/2020   Years since quitting: 0.9  Smokeless Tobacco Never   BP Readings from Last 3 Encounters:  10/08/21 135/75  10/01/21 137/86  09/03/21 (!) 169/76   Pulse Readings from Last 3 Encounters:  10/08/21 70  10/01/21 81  09/03/21 83   Wt Readings from Last 3 Encounters:  10/08/21 178 lb (80.7 kg)  10/01/21 181 lb 6.4 oz (82.3 kg)  09/03/21 172 lb (78 kg)   BMI Readings from Last 3 Encounters:  10/08/21 26.29 kg/m  10/01/21 26.79 kg/m  09/03/21 25.40 kg/m    Assessment/Interventions: Review of patient past medical history, allergies, medications, health status, including review of consultants reports, laboratory and other test data, was performed as part of comprehensive evaluation and provision of chronic care management services.   SDOH:  (Social Determinants of Health) assessments and interventions performed: Yes SDOH Interventions    Flowsheet Row Most Recent Value  SDOH Interventions   Financial Strain Interventions Intervention Not Indicated          SDOH Screenings   Alcohol Screen: Low Risk    Last Alcohol Screening Score (AUDIT): 0  Depression (PHQ2-9): Low Risk    PHQ-2 Score: 0  Financial Resource Strain: Low Risk    Difficulty of Paying Living Expenses: Not hard at all  Food Insecurity: Not on file  Housing: Not on file  Physical Activity: Not on file  Social Connections: Not on file  Stress: Not on file  Tobacco Use: Medium Risk   Smoking Tobacco Use: Former   Smokeless Tobacco Use: Never   Passive Exposure: Not on file  Transportation Needs: Not on file    [place]  Allergies  Allergen Reactions   Plaquenil [Hydroxychloroquine] Rash    Medications Reviewed Today      Reviewed by Gwyneth Sprout, FNP (Family Nurse Practitioner) on 10/08/21 at [place] List Status: <None>   Medication Order Taking? Sig Documenting Provider Last Dose Status Informant  acyclovir ointment (ZOVIRAX) 5 % [id number] Yes SMARTSIG:Topical Every 3 Hours [provider] Taking Active   albuterol (VENTOLIN HFA) 108 (90 Base) MCG/ACT inhaler [id number] Yes INHALE TWO PUFFS BY MOUTH INTO LUNGS every SIX hours AS NEEDED FOR WHEEZING AND/OR SHORTNESS OF Mellody Drown, FNP Taking Active   ARIPiprazole (ABILIFY) 10 MG tablet [id number] Yes Take 1 tablet (10 mg total) by mouth daily. Gwyneth Sprout, FNP Taking Active   atorvastatin (LIPITOR) 40 MG tablet [id number] Yes TAKE ONE TABLET BY MOUTH EVERY MORNING Gollan, Kathlene November, MD Taking Active  busPIRone (BUSPAR) 30 MG tablet [id number] Yes Take 1 tablet (30 mg total) by mouth 3 (three) times daily. Gwyneth Sprout, FNP Taking Active   carvedilol (COREG) 12.5 MG tablet [id number] Yes TAKE ONE TABLET BY MOUTH EVERY MORNING and TAKE ONE TABLET BY MOUTH EVERYDAY AT BEDTIME Minna Merritts, MD Taking Active   Eszopiclone 3 MG TABS [id number] Yes Take 1 tablet (3 mg total) by mouth at bedtime. Take immediately before bedtime Tally Joe T, FNP Taking Active   ezetimibe (ZETIA) 10 MG tablet [id number] Yes TAKE ONE TABLET BY MOUTH EVERY MORNING Gollan, Kathlene November, MD Taking Active   Fluticasone-Umeclidin-Vilant (TRELEGY ELLIPTA) 100-62.5-25 MCG/ACT AEPB [id number] Yes Inhale 1 puff into the lungs daily. Gwyneth Sprout, FNP Taking Active   gabapentin (NEURONTIN) 400 MG capsule [id number] Yes Take 1 capsule (400 mg total) by mouth 3 (three) times daily. [name], MD Taking Active   ipratropium (ATROVENT) 0.02 % nebulizer solution [id number] Yes Take 2.5 mLs (0.5 mg total) by nebulization 4 (four) times daily as needed for wheezing or shortness of breath. [name], PA-C Taking Active Self  leflunomide (ARAVA) 10 MG tablet [id number] Yes  Take 10 mg by mouth at bedtime.  [provider] Taking Active Self  leflunomide (ARAVA) 20 MG tablet [id number]  Take 20 mg by mouth at bedtime. [provider]  Consider Medication Status and Discontinue (Duplicate)   LINZESS 426 MCG CAPS capsule [id number] Yes Take 145 mcg by mouth daily before breakfast. [provider] Taking Active Self  meloxicam (MOBIC) 15 MG tablet [id number] Yes Take 15 mg by mouth at bedtime. [provider] Taking Active   Multiple Vitamin (MULTIVITAMIN WITH MINERALS) TABS tablet [id number] Yes Take 1 tablet by mouth daily. [provider] Taking Active Self  Nutritional Supplements (ESTROVEN ENERGY PO) [id number] Yes Take 1 tablet by mouth daily.  [provider] Taking Active Self  omeprazole (PRILOSEC) 40 MG capsule [id number] Yes Take 1 capsule (40 mg total) by mouth 2 (two) times daily. Chrismon, Vickki Muff, PA-C Taking Active   potassium chloride SA (KLOR-CON) 20 MEQ tablet [id number] Yes Take 1 tablet (20 mEq total) by mouth daily with breakfast. Chrismon, Vickki Muff, PA-C Taking Active Self  Spacer/Aero-Holding [facility]iah L[facility] DIAMOND) MISC [id number] Yes To assist PRN albuterol Gwyneth Sprout, FNP Taking Active   sulfamethoxazole-trimethoprim (BACTRIM DS) 800-160 MG tablet [id number] Yes Take 2 tablets by mouth 2 (two) times daily. Gwyneth Sprout, FNP  Active   topiramate (TOPAMAX) 25 MG tablet [id number] Yes Take 1 tablet (25 mg total) by mouth 2 (two) times daily. [name], MD Taking Active   valACYclovir (VALTREX) 1000 MG tablet [id number] Yes Take 2 tablets (2,000 mg total) by mouth See admin instructions. Take 2 g twice daily for 5 days as needed for fever blisters Tally Joe T, FNP Taking Active   venlafaxine XR (EFFEXOR-XR) 75 MG 24 hr capsule [id number] Yes Take 3 capsules (225 mg total) by mouth daily with breakfast. TAKE 3 CAPSULES BY MOUTH ONCE DAILY WITH BREAKFAST Chrismon, Vickki Muff, PA-C Taking Active    vitamin B-12 (CYANOCOBALAMIN) 1000 MCG tablet [id number] Yes Take 6,000 mcg by mouth daily. [provider] Taking Active   vitamin E 400 UNIT capsule [id number] Yes Take 1,600 Units by mouth daily. [provider] Taking Active   XIIDRA 5 % SOLN [id number] Yes Place 1 drop into both eyes daily. [provider] Taking Active Self  Med List Note Landis Martins, RN 06/24/21  3557): UDS 08/23/21            Patient Active Problem List   Diagnosis Date Noted   Cellulitis of left lower extremity 10/08/2021   Cellulitis of right lower extremity 10/08/2021   Thickened nails 10/08/2021   Nail fungus 10/08/2021   Callus of foot 10/08/2021   Screening for osteoporosis 10/08/2021   Encounter for screening mammogram for malignant neoplasm of breast 10/08/2021   Cellulitis of lower extremity 10/01/2021   Chronic cough 07/04/2021   Acute cough 07/01/2021   Acute bronchitis with COPD ([place]) 06/26/2021   COPD with acute exacerbation ([place]) 06/26/2021   Primary insomnia 06/26/2021   Elevated glucose 06/26/2021   Iron deficiency anemia 06/26/2021   Mixed hyperlipidemia 06/26/2021   Depression, major, single episode, moderate ([place]) 06/26/2021   Status post total hip replacement, right 04/15/2021   Chronic radicular lumbar pain 10/03/2020   Postlaminectomy syndrome, lumbar region 06/03/2020   Acute non-recurrent pansinusitis 03/12/2020   Moderate episode of recurrent major depressive disorder ([place]) 12/04/2019   Spondylosis of cervical region without myelopathy or radiculopathy 09/21/2019   DDD (degenerative disc disease), cervical 09/21/2019   Cervicalgia 09/21/2019   Cervical fusion syndrome 09/21/2019   Chronic pain syndrome 09/21/2019   Spinal stenosis, lumbar region, with neurogenic claudication 08/15/2019   History of adenomatous polyp of colon 11/04/2017   Cervical radiculopathy 08/02/2017   Neuropathy 08/02/2017   Essential hypertension 07/28/2017   Closed  compression fracture of L5 lumbar vertebra 07/07/2017   Sacral insufficiency fracture with routine healing 07/07/2017   COPD (chronic obstructive pulmonary disease) ([place]) 04/10/2015   Family history of malignant neoplasm of pancreas 04/03/2015   Hypercholesteremia 04/03/2015   Disorder of iron metabolism 04/03/2015   Weight loss 04/03/2015   Delayed onset of urination 04/03/2015   Acid reflux 10/11/2014   Closed fracture of distal phalanx of thumb 08/15/2014   Arthritis, degenerative 01/30/2014   Arthritis or polyarthritis, rheumatoid ([place]) 01/30/2014   Shortness of breath 12/22/2013   SMOKER 09/22/2010   Paroxysmal supraventricular tachycardia ([place]) 09/22/2010   CHEST PAIN UNSPECIFIED 09/22/2010   ELECTROCARDIOGRAM, ABNORMAL 09/22/2010   B-complex deficiency 09/09/2007   CN (constipation) 06/26/2007   Clinical depression 06/26/2007   Cold sore 06/26/2007   H/O alcohol abuse 06/26/2007   Cannot sleep 06/26/2007   Localized osteoarthrosis, hand 06/26/2007   Menopausal symptom 06/26/2007    Immunization History  Administered Date(s) Administered   Fluad Quad(high Dose 65+) 10/01/2021   Influenza Split 09/07/2012   Influenza,inj,Quad PF,6+ Mos 11/18/2015, 07/23/2016, 07/02/2017, 04/14/2018, 04/17/2019, 04/19/2020   Influenza-Unspecified 09/24/2014   PFIZER(Purple Top)SARS-COV-2 Vaccination 11/21/2019, 12/12/2019   Pneumococcal Polysaccharide-23 02/12/2011, 09/24/2014, 04/19/2020   Tdap 02/12/2011, 11/15/2015    Conditions to be addressed/monitored:  Hypertension, Hyperlipidemia, COPD, Depression, Tobacco use, and Insomnia, Chronic Pain, and Rheumatoid Arthritis  Care Plan : General Pharmacy (Adult)  Updates made by Germaine Pomfret, RPH since 10/15/2021 12:00 AM     Problem: Hypertension, Hyperlipidemia, COPD, Depression, Tobacco use and Chronic Pain, and Rheumatoid Arthritis   Priority: High     Long-Range Goal: Patient-Specific Goal   Start Date: 11/25/2020  Expected  End Date: 03/28/2022  This Visit's Progress: On track  Recent Progress: On track  Priority: High  Note:   Current Barriers:  Unable to independently afford treatment regimen Unable to achieve control of COPD  Suboptimal therapeutic regimen for depression  Pharmacist Clinical Goal(s):  Patient will verbalize ability to afford treatment regimen achieve control of COPD as evidenced by stable breathing,  avoiding cigarette use, and absence of exacerbations adhere to plan to optimize therapeutic regimen for depression as evidenced by report of adherence to recommended medication management changes through collaboration with PharmD and provider.   Interventions: 1:1 collaboration with Tally Joe, FNP regarding development and update of comprehensive plan of care as evidenced by provider attestation and co-signature Inter-disciplinary care team collaboration (see longitudinal plan of care) Comprehensive medication review performed; medication list updated in electronic medical record  Hypertension (BP goal <140/90) -Uncontrolled -Current treatment: Carvedilol 12.5 mg twice daily  Furosemide 20 mg daily as needed  -Medications previously tried: Diltiazem,   -Current home readings:   124/83  161/77 -Denies hypotensive/hypertensive symptoms -Dietary Habits: Picky eater, doesn't like many vegetables. Rarely cooks at home, but sister will cook for her. Denies caffeine intake.   Supper: Steak + Potatoes + Corn OR Mac and Cheese  -Recommended to continue current medication for now.  Hyperlipidemia: (LDL goal < 70) -Controlled -Current treatment: Atorvastatin 40 mg daily  Ezetimibe 10 mg daily  -Medications previously tried: Pravastatin, Rosuvastatin  -Educated on Benefits of statin for ASCVD risk reduction; -Recommended to continue current medication  COPD (Goal: control symptoms and prevent exacerbations) -Controlled -Current treatment  Ipratropium 0.02% nebulizer 2.5 mL every 4  hours Albuterol 2 puffs every 6 hours as needed  Trelegy 1 puff daily  -Medications previously tried: NA  -Gold Grade: Gold 2 (FEV1 50-79%) -Current COPD Classification:  B (high sx, <2 exacerbations/yr) -MMRC/CAT score: NA -Pulmonary function testing: 71% (2017) -Exacerbations requiring treatment in last 6 months: yes hospitalized Mar 2022 -Frequency of rescue inhaler use: 2-3 times daily  -Recommended to continue current medication  Depression/Anxiety (Goal: Maintain symptom remission) -Not ideally controlled -Current treatment: Aripiprazole 10 mg daily  Buspirone 30 mg three times daily  Eszopiclone 3 mg at bedtime  Venlafaxine XR 75 mg 3 capsules daily -Medications previously tried/failed: NA -PHQ9: 6 -GAD7: 9 -Able to fall asleep within 15-20 minutes, wakes up 4-5 times nightly. 5-6 hours of sleep nightly.  -Counseled on proper sleep hygiene.  -Continue current medications   Patient Goals/Self-Care Activities Patient will:  - check blood pressure weekly, document, and provide at future appointments Utilize maintenance inhaler daily  Follow Up Plan: Telephone follow up appointment with care management team member scheduled for:  01/19/2022 at 11:00 AM    Medication Assistance:  Application for Trelegy denied  Patient's preferred pharmacy is:  [facility] - [place], [place] - [address] [place] [address] Phone: [phone] Fax: [phone]  [address] [place], [place] [address] Phone: [phone] Fax: [phone]   Patient decided to: Utilize [facility] for medication synchronization, packaging and delivery   Care Plan and Follow Up Patient Decision:  Patient agrees to Care Plan and Follow-up.  Plan: Telephone follow up appointment with care management team member scheduled for:  01/19/2022 at 11:00 AM  Junius Argyle, PharmD, [place],  [place] [phone]

## 2021-09-30 ENCOUNTER — Ambulatory Visit: Payer: Self-pay | Admitting: *Deleted

## 2021-09-30 NOTE — Telephone Encounter (Signed)
°  Chief Complaint: rash Symptoms: redness, itching- legs Frequency: 2 weeks Pertinent Negatives: Patient denies dizziness, headache, sore throat, joint pain Disposition: [] ED /[] Urgent Care (no appt availability in office) / [x] Appointment(In office/virtual)/ []  [facility] [facility]/ [] Home Care/ [] Refused Recommended Disposition /[] [place] [facility]/ []  Follow-up with PCP Additional Notes:       Reason for Disposition  SEVERE itching (i.e., interferes with sleep, normal activities or school)  Answer Assessment - Initial Assessment Questions 1. APPEARANCE of RASH: "Describe the rash." (e.g., spots, blisters, raised areas, skin peeling, scaly)     Red- large red areas 2. SIZE: "How big are the spots?" (e.g., tip of pen, eraser, coin; inches, centimeters)     *No Answer* 3. LOCATION: "Where is the rash located?"     Legs - front and back 4. COLOR: "What color is the rash?" (Note: It is difficult to assess rash color in people with darker-colored skin. When this situation occurs, simply ask the caller to describe what they see.)     red 5. ONSET: "When did the rash begin?"     2 weeks 6. FEVER: "Do you have a fever?" If Yes, ask: "What is your temperature, how was it measured, and when did it start?"     No- heat to rash 7. ITCHING: "Does the rash itch?" If Yes, ask: "How bad is the itch?" (Scale 1-10; or mild, moderate, severe)     severe 8. CAUSE: "What do you think is causing the rash?"     Not sure 9. MEDICINE FACTORS: "Have you started any new medicines within the last 2 weeks?" (e.g., antibiotics)      no 10. OTHER SYMPTOMS: "Do you have any other symptoms?" (e.g., dizziness, headache, sore throat, joint pain)       no 11. PREGNANCY: "Is there any chance you are pregnant?" "When was your last menstrual period?"       *No Answer*  Protocols used: Rash or Redness - [facility]

## 2021-10-01 ENCOUNTER — Encounter: Payer: Self-pay | Admitting: Student in an Organized Health Care Education/Training Program

## 2021-10-01 ENCOUNTER — Other Ambulatory Visit: Payer: Self-pay

## 2021-10-01 ENCOUNTER — Encounter: Payer: Self-pay | Admitting: Family Medicine

## 2021-10-01 ENCOUNTER — Ambulatory Visit (INDEPENDENT_AMBULATORY_CARE_PROVIDER_SITE_OTHER): Payer: Medicare Other | Admitting: Family Medicine

## 2021-10-01 VITALS — BP 137/86 | HR 81 | Temp 98.1°F | Resp 16 | Wt 181.4 lb

## 2021-10-01 DIAGNOSIS — Z23 Encounter for immunization: Secondary | ICD-10-CM | POA: Diagnosis not present

## 2021-10-01 DIAGNOSIS — L03119 Cellulitis of unspecified part of limb: Secondary | ICD-10-CM | POA: Diagnosis not present

## 2021-10-01 MED ORDER — CEPHALEXIN 500 MG PO CAPS: 500.0000 mg | ORAL_CAPSULE | Freq: Four times a day (QID) | ORAL | 0 refills | Status: AC

## 2021-10-01 NOTE — Assessment & Plan Note (Signed)
Likely 2/2 break in skin from dryness. Rx keflex x5 days. Keep skin moisturized. RTC next week for recheck.

## 2021-10-01 NOTE — Progress Notes (Signed)
° ° °  SUBJECTIVE:   CHIEF COMPLAINT / HPI:   RASH Duration:  3-4 weeks  Location: lower legs bilaterally, R>L Itching: yes Burning: yes Redness: yes Oozing: no Scaling: yes Blisters: no Painful: no Fevers: no Change in detergents/soaps/personal care products: no New medications: no Recent illness: no Recent travel:no History of same: no No contacts with the same Alleviating factors: benadryl cream Treatments attempted:benadryl and lotion/moisturizer Shortness of breath: no  Throat/tongue swelling: no Myalgias/arthralgias:  nothing new   OBJECTIVE:   BP 137/86 (BP Location: Right Arm, Patient Position: Sitting, Cuff Size: Normal)    Pulse 81    Temp 98.1 F (36.7 C) (Oral)    Resp 16    Wt 181 lb 6.4 oz (82.3 kg)    SpO2 99%    BMI 26.79 kg/m   Gen: well appearing, in NAD Ext: WWP. Erythema, warmth, and slight swelling to bilateral lower leg, R>L. Dry skin overlying. Intact DP pulses.   ASSESSMENT/PLAN:   Cellulitis of lower extremity Likely 2/2 break in skin from dryness. Rx keflex x5 days. Keep skin moisturized. RTC next week for recheck.      Myles Gip, DO

## 2021-10-02 ENCOUNTER — Encounter: Payer: Self-pay | Admitting: Student in an Organized Health Care Education/Training Program

## 2021-10-02 ENCOUNTER — Other Ambulatory Visit: Payer: Self-pay

## 2021-10-02 ENCOUNTER — Ambulatory Visit
Payer: Medicare Other | Attending: Student in an Organized Health Care Education/Training Program | Admitting: Student in an Organized Health Care Education/Training Program

## 2021-10-02 DIAGNOSIS — G894 Chronic pain syndrome: Secondary | ICD-10-CM

## 2021-10-02 DIAGNOSIS — G629 Polyneuropathy, unspecified: Secondary | ICD-10-CM

## 2021-10-02 DIAGNOSIS — M5030 Other cervical disc degeneration, unspecified cervical region: Secondary | ICD-10-CM | POA: Diagnosis not present

## 2021-10-02 DIAGNOSIS — M47812 Spondylosis without myelopathy or radiculopathy, cervical region: Secondary | ICD-10-CM

## 2021-10-02 DIAGNOSIS — M5416 Radiculopathy, lumbar region: Secondary | ICD-10-CM

## 2021-10-02 DIAGNOSIS — M961 Postlaminectomy syndrome, not elsewhere classified: Secondary | ICD-10-CM

## 2021-10-02 NOTE — Progress Notes (Signed)
Patient: Gabrielle Pineda  Service Category: E/M  Provider: [name], MD  DOB: 1956-01-04  DOS: 10/02/2021  Location: Office  MRN: [id number]  Setting: Ambulatory outpatient  Referring Provider: Gwyneth Sprout, FNP  Type: Established Patient  Specialty: Interventional Pain Management  PCP: Gwyneth Sprout, FNP  Location: Remote location  Delivery: TeleHealth     Virtual Encounter - Pain Management PROVIDER NOTE: Information contained herein reflects review and annotations entered in association with encounter. Interpretation of such information and data should be left to medically-trained personnel. Information provided to patient can be located elsewhere in the medical record under "Patient Instructions". Document created using STT-dictation technology, any transcriptional errors that may result from process are unintentional.    Contact & Pharmacy Preferred: [place]: [phone] (home) Mobile: [phone] (mobile) E-mail: [email or web address]  [facility] - [place], [place] - [address] [place] [address] Phone: [phone] Fax: [phone]  [address] [place], [place] [address] Phone: [phone] Fax: [phone]   Pre-screening  [name] offered "in-person" vs "virtual" encounter. She indicated preferring virtual for this encounter.   Reason COVID-19*   Social distancing based on CDC and AMA recommendations.   I contacted Gabrielle Pineda on 10/02/2021 via telephone.      I clearly identified myself as [name], MD. I verified that I was speaking with the correct person using two identifiers (Name: MYLINDA BROOK, and date of birth: 22-Jan-1956).  Consent I sought verbal advanced consent from Gabrielle Pineda for virtual visit interactions. I informed Gabrielle Pineda of possible security and privacy concerns, risks, and limitations  associated with providing "not-in-person" medical evaluation and management services. I also informed Gabrielle Pineda of the availability of "in-person" appointments. Finally, I informed her that there would be a charge for the virtual visit and that she could be  personally, fully or partially, financially responsible for it. Gabrielle Pineda expressed understanding and agreed to proceed.   Historic Elements   Gabrielle Pineda is a 66 y.o. year old, female patient evaluated today after our last contact on 09/04/2021. Gabrielle Pineda  has a past medical history of Alcohol abuse, Anemia, Anxiety, Arthritis, Cervicalgia, Cirrhosis ([place]) (1994), COPD (chronic obstructive pulmonary disease) ([place]), Depression, Difficult intubation, Dyspnea, GERD (gastroesophageal reflux disease), Headache, Heart murmur, Hypertension, Other and unspecified hyperlipidemia, Status post insertion of spinal cord stimulator, and Tachycardia. She also  has a past surgical history that includes neck disc surgery; Breast enhancement surgery (1981); Esophagogastroduodenoscopy; Tonsillectomy and adenoidectomy (1973 ); Carpal tunnel release (11/11/2011); Carpal tunnel release (2013); Rotator cuff repair (06/16/2016); Rotator cuff repair; Colonoscopy with propofol (N/A, 11/01/2017); Augmentation mammaplasty (Bilateral, 1982); Anterior cervical decomp/discectomy fusion (2012, 2015, 2018); Lumbar laminectomy/decompression microdiscectomy (N/A, 08/15/2019); Spinal cord stimulator insertion (N/A, 03/17/2021); and Total hip arthroplasty (Right, 04/15/2021). Gabrielle Pineda has a current medication list which includes the following prescription(s): albuterol, aripiprazole, atorvastatin, buspirone, carvedilol, eszopiclone, ezetimibe, trelegy ellipta, gabapentin, ipratropium, leflunomide, linzess, meloxicam, multivitamin with minerals, misc natural products, omeprazole, oxycodone-acetaminophen, potassium chloride sa, optichamber diamond, topiramate, valacyclovir,  venlafaxine xr, vitamin e, xiidra, acyclovir ointment, cephalexin, and vitamin b-12. She  reports that she quit smoking about 11 months ago. Her smoking use included cigarettes. She has a 40.00 pack-year smoking history. She has never used smokeless tobacco. She reports that she does not drink alcohol and does not use drugs. Gabrielle Pineda is allergic to plaquenil [  hydroxychloroquine].   HPI  Today, she is being contacted for medication management.  Follow-up for medication management.  At her last clinic visit decision made to decrease her gabapentin dose from 600 3 times daily to 400 3 times daily given cognitive impairment and increased forgetfulness.  Patient states that she has not noticed much of a difference with dose reduction so I recommend that she decrease it down further to 400 mg twice a day and see how she does.  Laboratory Chemistry Profile   Renal Lab Results  Component Value Date   BUN 12 04/16/2021   CREATININE 0.99 04/16/2021   BCR 7 (L) 11/08/2020   GFRAA 72 05/06/2020   GFRNONAA >60 04/16/2021    Hepatic Lab Results  Component Value Date   AST 19 04/04/2021   ALT 18 04/04/2021   ALBUMIN 3.4 (L) 04/04/2021   ALKPHOS 186 (H) 04/04/2021   LIPASE 63 03/23/2018    Electrolytes Lab Results  Component Value Date   NA 139 04/16/2021   K 3.5 04/16/2021   CL 111 04/16/2021   CALCIUM 8.3 (L) 04/16/2021   PHOS 3.2 05/06/2020    Bone No results found for: VD25OH, VD125OH2TOT, GN5621HY8, MV7846NG2, 25OHVITD1, 25OHVITD2, 25OHVITD3, TESTOFREE, TESTOSTERONE  Inflammation (CRP: Acute Phase) (ESR: Chronic Phase) No results found for: CRP, ESRSEDRATE, LATICACIDVEN       Note: Above Lab results reviewed.  Imaging  NM Bone Scan 3 Phase CLINICAL DATA:  RIGHT hip replacement. RIGHT hip pain. Hip replacement August 22  EXAM: NUCLEAR MEDICINE 3-PHASE BONE SCAN  TECHNIQUE: Radionuclide angiographic images, immediate static blood pool images, and 3-hour delayed static  images were obtained of the hips after intravenous injection of radiopharmaceutical.  RADIOPHARMACEUTICALS:  21.4 mCi Tc-21mMDP IV  COMPARISON:  None.  FINDINGS: Vascular phase: No asymmetric or increased blood flow to the LEFT or RIGHT hip.  Blood pool phase: Increased blood pool activity in the soft tissues about the RIGHT greater trochanter region.  Delayed phase: Moderate diffuse radiotracer accumulation associated with the proximal RIGHT femur surrounding the femoral stem of the RIGHT hip prosthetic. More mild uptake in the RIGHT acetabulum. No focal uptake.  IMPRESSION: Moderate diffuse uptake within the proximal RIGHT femur surrounding the prosthetic . No hypervascular activity to suggest infection. No focality.  Electronically Signed   By: SSuzy BouchardM.D.   On: 07/08/2021 15:40  Assessment  The primary encounter diagnosis was Chronic pain syndrome. Diagnoses of Spondylosis of cervical region without myelopathy or radiculopathy, Neuropathy, Failed back surgical syndrome, Postlaminectomy syndrome, lumbar region, DDD (degenerative disc disease), cervical, and Lumbar radiculopathy (R>L) were also pertinent to this visit.  Plan of Care   Decrease gabapentin down further to 400 mg twice daily to see if that improves cognitive dysfunction and forgetfulness.  Follow-up as needed. She states that she does not need a refill for gabapentin.  Follow-up plan:   Return if symptoms worsen or fail to improve.    Recent Visits Date Type Provider Dept  09/03/21 Office Visit LGillis Santa MD [facility]  Showing recent visits within past 90 days and meeting all other requirements Today's Visits Date Type Provider Dept  10/02/21 Office Visit LGillis Santa MD [facility]  Showing today's visits and meeting all other requirements Future Appointments No visits were found meeting these conditions. Showing future appointments within next 90 days and  meeting all other requirements  I discussed the assessment and treatment plan with the patient. The patient was provided an opportunity to  ask questions and all were answered. The patient agreed with the plan and demonstrated an understanding of the instructions.  Patient advised to call back or seek an in-person evaluation if the symptoms or condition worsens.  Duration of encounter: 91mnutes.  Note by: BGillis Santa MD Date: 10/02/2021; Time: 3:14 PM

## 2021-10-07 NOTE — Progress Notes (Signed)
Established patient visit   Patient: Gabrielle Pineda   DOB: 12-29-1955   66 y.o. Female  MRN: [id number] Visit Date: 10/08/2021  Today's healthcare provider: Gwyneth Sprout, FNP   Chief Complaint  Patient presents with   Follow-up   I,Sulibeya S Dimas,acting as a scribe for Gwyneth Sprout, FNP.,have documented all relevant documentation on the behalf of Gwyneth Sprout, FNP,as directed by  Gwyneth Sprout, FNP while in the presence of Gwyneth Sprout, FNP.  Subjective    HPI  Patient is a 66 year old female who presents for 1 week follow up on cellulitis of lower extremity. Patient advised to keep skin moisturized. Patient reports rash has improved, still has some redness and itching.  Medications: Outpatient Medications Prior to Visit  Medication Sig   acyclovir ointment (ZOVIRAX) 5 % SMARTSIG:Topical Every 3 Hours   albuterol (VENTOLIN HFA) 108 (90 Base) MCG/ACT inhaler INHALE TWO PUFFS BY MOUTH INTO LUNGS every SIX hours AS NEEDED FOR WHEEZING AND/OR SHORTNESS OF BREATH   ARIPiprazole (ABILIFY) 10 MG tablet Take 1 tablet (10 mg total) by mouth daily.   atorvastatin (LIPITOR) 40 MG tablet TAKE ONE TABLET BY MOUTH EVERY MORNING   busPIRone (BUSPAR) 30 MG tablet Take 1 tablet (30 mg total) by mouth 3 (three) times daily.   carvedilol (COREG) 12.5 MG tablet TAKE ONE TABLET BY MOUTH EVERY MORNING and TAKE ONE TABLET BY MOUTH EVERYDAY AT BEDTIME   Eszopiclone 3 MG TABS Take 1 tablet (3 mg total) by mouth at bedtime. Take immediately before bedtime   ezetimibe (ZETIA) 10 MG tablet TAKE ONE TABLET BY MOUTH EVERY MORNING   Fluticasone-Umeclidin-Vilant (TRELEGY ELLIPTA) 100-62.5-25 MCG/ACT AEPB Inhale 1 puff into the lungs daily.   gabapentin (NEURONTIN) 400 MG capsule Take 1 capsule (400 mg total) by mouth 3 (three) times daily.   ipratropium (ATROVENT) 0.02 % nebulizer solution Take 2.5 mLs (0.5 mg total) by nebulization 4 (four) times daily as needed for wheezing or shortness of breath.    leflunomide (ARAVA) 10 MG tablet Take 10 mg by mouth at bedtime.    LINZESS 145 MCG CAPS capsule Take 145 mcg by mouth daily before breakfast.   meloxicam (MOBIC) 15 MG tablet Take 15 mg by mouth at bedtime.   Multiple Vitamin (MULTIVITAMIN WITH MINERALS) TABS tablet Take 1 tablet by mouth daily.   Nutritional Supplements (ESTROVEN ENERGY PO) Take 1 tablet by mouth daily.    omeprazole (PRILOSEC) 40 MG capsule Take 1 capsule (40 mg total) by mouth 2 (two) times daily.   potassium chloride SA (KLOR-CON) 20 MEQ tablet Take 1 tablet (20 mEq total) by mouth daily with breakfast.   Spacer/Aero-Holding Chambers (OPTICHAMBER DIAMOND) MISC To assist PRN albuterol   topiramate (TOPAMAX) 25 MG tablet Take 1 tablet (25 mg total) by mouth 2 (two) times daily.   valACYclovir (VALTREX) 1000 MG tablet Take 2 tablets (2,000 mg total) by mouth See admin instructions. Take 2 g twice daily for 5 days as needed for fever blisters   venlafaxine XR (EFFEXOR-XR) 75 MG 24 hr capsule Take 3 capsules (225 mg total) by mouth daily with breakfast. TAKE 3 CAPSULES BY MOUTH ONCE DAILY WITH BREAKFAST   vitamin B-12 (CYANOCOBALAMIN) 1000 MCG tablet Take 6,000 mcg by mouth daily.   vitamin E 400 UNIT capsule Take 1,600 Units by mouth daily.   XIIDRA 5 % SOLN Place 1 drop into both eyes daily.   leflunomide (ARAVA) 20 MG tablet Take  20 mg by mouth at bedtime.   No facility-administered medications prior to visit.    Review of Systems  Constitutional:  Negative for appetite change and fever.  Respiratory:  Negative for chest tightness and shortness of breath.   Skin:  Positive for rash.      Objective    BP 135/75 (BP Location: Left Arm, Patient Position: Sitting, Cuff Size: Large)    Pulse 70    Temp 98.3 F (36.8 C) (Temporal)    Resp 16    Wt 178 lb (80.7 kg)    SpO2 98%    BMI 26.29 kg/m    Physical Exam Vitals and nursing note reviewed.  Constitutional:      General: She is not in acute distress.     Appearance: Normal appearance. She is overweight. She is not ill-appearing, toxic-appearing or diaphoretic.  HENT:     Head: Normocephalic and atraumatic.  Cardiovascular:     Rate and Rhythm: Normal rate and regular rhythm.     Pulses: Normal pulses.     Heart sounds: Normal heart sounds. No murmur heard.   No friction rub. No gallop.  Pulmonary:     Effort: Pulmonary effort is normal. No respiratory distress.     Breath sounds: Normal breath sounds. No stridor. No wheezing, rhonchi or rales.  Chest:     Chest wall: No tenderness.  Abdominal:     General: Bowel sounds are normal.     Palpations: Abdomen is soft.  Musculoskeletal:        General: Swelling and tenderness present. No deformity or signs of injury. Normal range of motion.     Right lower leg: No edema.     Left lower leg: No edema.       Legs:  Skin:    General: Skin is warm and dry.     Capillary Refill: Capillary refill takes less than 2 seconds.     Coloration: Skin is not jaundiced or pale.     Findings: No bruising, erythema, lesion or rash.  Neurological:     General: No focal deficit present.     Mental Status: She is alert and oriented to person, place, and time. Mental status is at baseline.     Cranial Nerves: No cranial nerve deficit.     Sensory: No sensory deficit.     Motor: No weakness.     Coordination: Coordination normal.  Psychiatric:        Mood and Affect: Mood normal.        Behavior: Behavior normal.        Thought Content: Thought content normal.        Judgment: Judgment normal.     No results found for any visits on 10/08/21.  Assessment & Plan     Problem List Items Addressed This Visit       Musculoskeletal and Integument   Callus of foot    Callus noted on left foot Encourage referral to podiatry for recommendations Patient denies any problems with breakdown      Relevant Orders   Ambulatory referral to Podiatry   Nail fungus    History of nail fungus present  throughout on both feet today Recommend follow-up with podiatry to discuss possible treatment options      Relevant Medications   sulfamethoxazole-trimethoprim (BACTRIM DS) 800-160 MG tablet   Other Relevant Orders   Ambulatory referral to Podiatry     Other   Cellulitis of left lower extremity -  Primary    Cellulitis remains on left lower extremity, patient endorsed 2-day improvement following last antibiotic however, has since exacerbated Continues to moisten with daily moisturizer Recommend repeat use of antibiotics and follow-up with podiatry to assist with nail fungus as well as thickened toenails as both could contribute      Relevant Medications   sulfamethoxazole-trimethoprim (BACTRIM DS) 800-160 MG tablet   Cellulitis of right lower extremity    Cellulitis remains on right lower extremity, patient endorsed 2-day improvement following last antibiotic however, has since exacerbated Right lower extremity shows larger area of cellulitis skin infection from left please see documentation for representation Continues to moisten with daily moisturizer Recommend repeat use of antibiotics and follow-up with podiatry to assist with nail fungus as well as thickened toenails as both could contribute      Relevant Medications   sulfamethoxazole-trimethoprim (BACTRIM DS) 800-160 MG tablet   Encounter for screening mammogram for malignant neoplasm of breast    Patient due for screening of breast cancer, mammogram ordered today      Relevant Orders   MM 3D SCREEN BREAST BILATERAL   Screening for osteoporosis    Patient due for screening for osteoporosis      Relevant Orders   DG Bone Density   Thickened nails    Thickened nails seen on both feet Recommend referral to podiatry to assist with trimming and possible Dremel      Relevant Orders   Ambulatory referral to Podiatry     Return for annual examination.      Ar[place]ina Ponder De[place],acting as a scribe for Gwyneth Sprout,  FNP.,have documented all relevant documentation on the behalf of Gwyneth Sprout, FNP,as directed by  Gwyneth Sprout, FNP while in the presence of Gwyneth Sprout, FNP.  Vonna Kotyk, FNP, have reviewed all documentation for this visit. The documentation on 10/08/21 for the exam, diagnosis, procedures, and orders are all accurate and complete.    Gwyneth Sprout, [place] [phone] (phone) [phone] (fax)  [place]

## 2021-10-08 ENCOUNTER — Ambulatory Visit (INDEPENDENT_AMBULATORY_CARE_PROVIDER_SITE_OTHER): Payer: Medicare Other | Admitting: Family Medicine

## 2021-10-08 ENCOUNTER — Ambulatory Visit: Payer: Medicare Other | Admitting: Family Medicine

## 2021-10-08 ENCOUNTER — Other Ambulatory Visit: Payer: Self-pay

## 2021-10-08 ENCOUNTER — Encounter: Payer: Self-pay | Admitting: Family Medicine

## 2021-10-08 VITALS — BP 135/75 | HR 70 | Temp 98.3°F | Resp 16 | Wt 178.0 lb

## 2021-10-08 DIAGNOSIS — B351 Tinea unguium: Secondary | ICD-10-CM | POA: Insufficient documentation

## 2021-10-08 DIAGNOSIS — Z13820 Encounter for screening for osteoporosis: Secondary | ICD-10-CM | POA: Diagnosis not present

## 2021-10-08 DIAGNOSIS — Z1231 Encounter for screening mammogram for malignant neoplasm of breast: Secondary | ICD-10-CM | POA: Diagnosis not present

## 2021-10-08 DIAGNOSIS — L84 Corns and callosities: Secondary | ICD-10-CM | POA: Diagnosis not present

## 2021-10-08 DIAGNOSIS — L03115 Cellulitis of right lower limb: Secondary | ICD-10-CM | POA: Diagnosis not present

## 2021-10-08 DIAGNOSIS — L03116 Cellulitis of left lower limb: Secondary | ICD-10-CM | POA: Diagnosis not present

## 2021-10-08 DIAGNOSIS — L602 Onychogryphosis: Secondary | ICD-10-CM | POA: Diagnosis not present

## 2021-10-08 MED ORDER — SULFAMETHOXAZOLE-TRIMETHOPRIM 800-160 MG PO TABS
2.0000 | ORAL_TABLET | Freq: Two times a day (BID) | ORAL | 0 refills | Status: DC
Start: 2021-10-08 — End: 2021-12-11

## 2021-10-08 NOTE — Assessment & Plan Note (Signed)
Cellulitis remains on right lower extremity, patient endorsed 2-day improvement following last antibiotic however, has since exacerbated Right lower extremity shows larger area of cellulitis skin infection from left please see documentation for representation Continues to moisten with daily moisturizer Recommend repeat use of antibiotics and follow-up with podiatry to assist with nail fungus as well as thickened toenails as both could contribute

## 2021-10-08 NOTE — Assessment & Plan Note (Signed)
History of nail fungus present throughout on both feet today Recommend follow-up with podiatry to discuss possible treatment options

## 2021-10-08 NOTE — Assessment & Plan Note (Signed)
Cellulitis remains on left lower extremity, patient endorsed 2-day improvement following last antibiotic however, has since exacerbated Continues to moisten with daily moisturizer Recommend repeat use of antibiotics and follow-up with podiatry to assist with nail fungus as well as thickened toenails as both could contribute

## 2021-10-08 NOTE — Assessment & Plan Note (Signed)
Patient due for screening for osteoporosis

## 2021-10-08 NOTE — Assessment & Plan Note (Signed)
Patient due for screening of breast cancer, mammogram ordered today

## 2021-10-08 NOTE — Assessment & Plan Note (Signed)
Thickened nails seen on both feet Recommend referral to podiatry to assist with trimming and possible Dremel

## 2021-10-08 NOTE — Assessment & Plan Note (Signed)
Callus noted on left foot Encourage referral to podiatry for recommendations Patient denies any problems with breakdown

## 2021-10-14 ENCOUNTER — Telehealth: Payer: Self-pay

## 2021-10-14 DIAGNOSIS — I10 Essential (primary) hypertension: Secondary | ICD-10-CM

## 2021-10-14 DIAGNOSIS — F321 Major depressive disorder, single episode, moderate: Secondary | ICD-10-CM

## 2021-10-14 NOTE — Progress Notes (Signed)
Per Clinical pharmacist, please reschedule patient appointment on 10/17/2021 to June 2023.  Patient reschedule her telephone follow up with the clinical pharmacist to January 19 2022 at 11:00 pm.  [place] Pharmacist Assistant [phone]

## 2021-10-15 ENCOUNTER — Encounter: Payer: Self-pay | Admitting: Family Medicine

## 2021-10-15 NOTE — Patient Instructions (Signed)
Visit Information It was great speaking with you today!  Please let me know if you have any questions about our visit.   Goals Addressed             This Visit's Progress    Track and Manage My Triggers-COPD   On track    Timeframe:  Long-Range Goal Priority:  High Start Date: 11/25/2020                            Expected End Date:  05/27/2022                     Follow Up within 30 days   - avoid second hand smoke - eliminate smoking in my home - identify and remove indoor air pollutants - limit outdoor activity during cold weather    Why is this important?   Triggers are activities or things, like tobacco smoke or cold weather, that make your COPD (chronic obstructive pulmonary disease) flare-up.  Knowing these triggers helps you plan how to stay away from them.  When you cannot remove them, you can learn how to manage them.     Notes:         Patient Care Plan: General Pharmacy (Adult)     Problem Identified: Hypertension, Hyperlipidemia, COPD, Depression, Tobacco use and Chronic Pain, and Rheumatoid Arthritis   Priority: High     Long-Range Goal: Patient-Specific Goal   Start Date: 11/25/2020  Expected End Date: 03/28/2022  This Visit's Progress: On track  Recent Progress: On track  Priority: High  Note:   Current Barriers:  Unable to independently afford treatment regimen Unable to achieve control of COPD  Suboptimal therapeutic regimen for depression  Pharmacist Clinical Goal(s):  Patient will verbalize ability to afford treatment regimen achieve control of COPD as evidenced by stable breathing, avoiding cigarette use, and absence of exacerbations adhere to plan to optimize therapeutic regimen for depression as evidenced by report of adherence to recommended medication management changes through collaboration with PharmD and provider.   Interventions: 1:1 collaboration with Tally Joe, FNP regarding development and update of comprehensive plan of care  as evidenced by provider attestation and co-signature Inter-disciplinary care team collaboration (see longitudinal plan of care) Comprehensive medication review performed; medication list updated in electronic medical record  Hypertension (BP goal <140/90) -Uncontrolled -Current treatment: Carvedilol 12.5 mg twice daily  Furosemide 20 mg daily as needed  -Medications previously tried: Diltiazem,   -Current home readings:   124/83  161/77 -Denies hypotensive/hypertensive symptoms -Dietary Habits: Picky eater, doesn't like many vegetables. Rarely cooks at home, but sister will cook for her. Denies caffeine intake.   Supper: Steak + Potatoes + Corn OR Mac and Cheese  -Recommended to continue current medication for now.  Hyperlipidemia: (LDL goal < 70) -Controlled -Current treatment: Atorvastatin 40 mg daily  Ezetimibe 10 mg daily  -Medications previously tried: Pravastatin, Rosuvastatin  -Educated on Benefits of statin for ASCVD risk reduction; -Recommended to continue current medication  COPD (Goal: control symptoms and prevent exacerbations) -Controlled -Current treatment  Ipratropium 0.02% nebulizer 2.5 mL every 4 hours Albuterol 2 puffs every 6 hours as needed  Trelegy 1 puff daily  -Medications previously tried: NA  -Gold Grade: Gold 2 (FEV1 50-79%) -Current COPD Classification:  B (high sx, <2 exacerbations/yr) -MMRC/CAT score: NA -Pulmonary function testing: 71% (2017) -Exacerbations requiring treatment in last 6 months: yes hospitalized Mar 2022 -Frequency of rescue inhaler use:  2-3 times daily  -Recommended to continue current medication  Depression/Anxiety (Goal: Maintain symptom remission) -Not ideally controlled -Current treatment: Aripiprazole 10 mg daily  Buspirone 30 mg three times daily  Eszopiclone 3 mg at bedtime  Venlafaxine XR 75 mg 3 capsules daily -Medications previously tried/failed: NA -PHQ9: 6 -GAD7: 9 -Able to fall asleep within 15-20  minutes, wakes up 4-5 times nightly. 5-6 hours of sleep nightly.  -Counseled on proper sleep hygiene.  -Continue current medications   Patient Goals/Self-Care Activities Patient will:  - check blood pressure weekly, document, and provide at future appointments Utilize maintenance inhaler daily  Follow Up Plan: Telephone follow up appointment with care management team member scheduled for:  01/19/2022 at 11:00 AM      Patient agreed to services and verbal consent obtained.   Patient verbalizes understanding of instructions and care plan provided today and agrees to view in [place]. Active MyChart status confirmed with patient.    Junius Argyle, PharmD, [place], CPP  Clinical Pharmacist Practitioner  [facility] [phone]

## 2021-10-17 ENCOUNTER — Ambulatory Visit: Payer: Medicare Other | Admitting: Podiatry

## 2021-10-17 ENCOUNTER — Telehealth: Payer: Self-pay | Admitting: Family Medicine

## 2021-10-17 ENCOUNTER — Encounter: Payer: Self-pay | Admitting: Podiatry

## 2021-10-17 ENCOUNTER — Telehealth: Payer: Self-pay

## 2021-10-17 ENCOUNTER — Other Ambulatory Visit: Payer: Self-pay

## 2021-10-17 ENCOUNTER — Telehealth: Payer: Medicare Other

## 2021-10-17 DIAGNOSIS — K219 Gastro-esophageal reflux disease without esophagitis: Secondary | ICD-10-CM

## 2021-10-17 DIAGNOSIS — M2042 Other hammer toe(s) (acquired), left foot: Secondary | ICD-10-CM | POA: Diagnosis not present

## 2021-10-17 DIAGNOSIS — M79675 Pain in left toe(s): Secondary | ICD-10-CM

## 2021-10-17 DIAGNOSIS — M79674 Pain in right toe(s): Secondary | ICD-10-CM | POA: Diagnosis not present

## 2021-10-17 DIAGNOSIS — M2041 Other hammer toe(s) (acquired), right foot: Secondary | ICD-10-CM

## 2021-10-17 DIAGNOSIS — L989 Disorder of the skin and subcutaneous tissue, unspecified: Secondary | ICD-10-CM | POA: Diagnosis not present

## 2021-10-17 DIAGNOSIS — B351 Tinea unguium: Secondary | ICD-10-CM | POA: Diagnosis not present

## 2021-10-17 DIAGNOSIS — F331 Major depressive disorder, recurrent, moderate: Secondary | ICD-10-CM

## 2021-10-17 NOTE — Telephone Encounter (Signed)
Copied from [place] [id number]. Topic: Medicare AWV ?>> Oct 17, 2021 12:57 PM Cher Nakai R wrote: ?Reason for CRM:  ?Left message for patient to call back and schedule Medicare Annual Wellness Visit (AWV) in office.  ? ?If not able to come in office, please offer to do virtually or by telephone.  ? ?Last AWV: 04/17/2019 ? ?Please schedule at anytime with [facility] Health Advisor. ? ?If any questions, please contact me at [phone] ?

## 2021-10-17 NOTE — Progress Notes (Signed)
? ?  SUBJECTIVE ?Patient presents to office today complaining of elongated, thickened nails that cause pain while ambulating in shoes.  Patient is unable to trim their own nails.  Patient also developed symptomatic calluses to the distal tips of the bilateral toes.  She also states that she does have hammertoe contracture deformities.  Patient is here for further evaluation and treatment. ? ?Past Medical History:  ?Diagnosis Date  ? Alcohol abuse   ? Anemia   ? Anxiety   ? Arthritis   ? Cervicalgia   ? Cirrhosis ([place]) 1994  ? COPD (chronic obstructive pulmonary disease) ([place])   ? Depression   ? Difficult intubation   ? has plates and screws in neck  ? Dyspnea   ? GERD (gastroesophageal reflux disease)   ? Headache   ? Heart murmur   ? on heard when pt is lying  ? Hypertension   ? Other and unspecified hyperlipidemia   ? Status post insertion of spinal cord stimulator   ? Tachycardia   ? d/t questionable anxiety happens every 5- 10 years, sees [facility]  ? ? ?OBJECTIVE ?General Patient is awake, alert, and oriented x 3 and in no acute distress. ?Derm Skin is dry and supple bilateral. Negative open lesions or macerations. Remaining integument unremarkable. Nails are tender, long, thickened and dystrophic with subungual debris, consistent with onychomycosis, 1-5 bilateral. No signs of infection noted.  Hyperkeratotic preulcerative callus tissue also noted to the distal tips of the toes bilateral secondary to hammertoe contracture ?Vasc  DP and PT pedal pulses palpable bilaterally. Temperature gradient within normal limits.  ?Neuro Epicritic and protective threshold sensation grossly intact bilaterally.  ?Musculoskeletal Exam hammertoes of the lesser digits 2-5 noted bilateral ? ?ASSESSMENT ?1.  Pain due to onychomycosis of toenails both ?2.  Preulcerative callus lesions bilateral ?3.  Hammertoe deformity bilateral ? ?PLAN OF CARE ?1. Patient evaluated today.  ?2. Instructed to maintain good pedal hygiene and  foot care.  ?3. Mechanical debridement of nails 1-5 bilaterally performed using a nail nipper. Filed with dremel without incident.  ?4.  Excisional debridement of the hyperkeratotic preulcerative callus tissue was performed using a tissue nipper without incident or bleeding  ?5.  In regards to the hammertoes, recommend good supportive shoes and sneakers that do not constrict the toebox area.  Advised against going barefoot.   ?6.  Return to clinic in 3 mos.  ? ? ?Edrick Kins, DPM ?[place] ? ?Dr. Edrick Kins, DPM  ?  ?[address]                                     ?[place], [place] [address]                ?Office [phone]  ?Fax [phone] ? ? ? ? ?

## 2021-10-17 NOTE — Progress Notes (Signed)
? ? ?Chronic Care Management ?Pharmacy Assistant  ? ?Name: Gabrielle Pineda  MRN: [id number] DOB: 1955-12-02 ? ?Reason for Encounter: Medication Review/Medication Coordination Call ?  ?Recent office visits:  ?10/08/2021 Tally Joe, FNP (PCP Office Visit) for Follow-up- Started: Sulfamethoxazole-Trimethoprim 800-160 mg twice daily, Bone Density ordered, MM 3D Screen Breast Bilateral Ordered, Referral for Podiatry Ordered, No follow-up noted ? ?10/01/2021 Rory Percy, DO (PCP Office Visit) for Rash- Started: Cephalexin 500 mg 4 times daily, No orders placed, No follow-up noted ? ?Recent consult visits:  ?10/02/2021 [name], MD (Pain Management) for Chronic Pain Syndrome- No medication changes noted, no orders placed, No follow-up noted ? ?Hospital visits:  ?None in previous 6 months ? ?Medications: ?Outpatient Encounter Medications as of 10/17/2021  ?Medication Sig  ? acyclovir ointment (ZOVIRAX) 5 % SMARTSIG:Topical Every 3 Hours  ? albuterol (VENTOLIN HFA) 108 (90 Base) MCG/ACT inhaler INHALE TWO PUFFS BY MOUTH INTO LUNGS every SIX hours AS NEEDED FOR WHEEZING AND/OR SHORTNESS OF BREATH  ? ARIPiprazole (ABILIFY) 10 MG tablet Take 1 tablet (10 mg total) by mouth daily.  ? atorvastatin (LIPITOR) 40 MG tablet TAKE ONE TABLET BY MOUTH EVERY MORNING  ? busPIRone (BUSPAR) 30 MG tablet Take 1 tablet (30 mg total) by mouth 3 (three) times daily.  ? carvedilol (COREG) 12.5 MG tablet TAKE ONE TABLET BY MOUTH EVERY MORNING and TAKE ONE TABLET BY MOUTH EVERYDAY AT BEDTIME  ? Eszopiclone 3 MG TABS Take 1 tablet (3 mg total) by mouth at bedtime. Take immediately before bedtime  ? ezetimibe (ZETIA) 10 MG tablet TAKE ONE TABLET BY MOUTH EVERY MORNING  ? Fluticasone-Umeclidin-Vilant (TRELEGY ELLIPTA) 100-62.5-25 MCG/ACT AEPB Inhale 1 puff into the lungs daily.  ? gabapentin (NEURONTIN) 400 MG capsule Take 1 capsule (400 mg total) by mouth 3 (three) times daily.  ? ipratropium (ATROVENT) 0.02 % nebulizer solution Take 2.5 mLs  (0.5 mg total) by nebulization 4 (four) times daily as needed for wheezing or shortness of breath.  ? leflunomide (ARAVA) 10 MG tablet Take 10 mg by mouth at bedtime.   ? leflunomide (ARAVA) 20 MG tablet Take 20 mg by mouth at bedtime.  ? LINZESS 145 MCG CAPS capsule Take 145 mcg by mouth daily before breakfast.  ? meloxicam (MOBIC) 15 MG tablet Take 15 mg by mouth at bedtime.  ? Multiple Vitamin (MULTIVITAMIN WITH MINERALS) TABS tablet Take 1 tablet by mouth daily.  ? Nutritional Supplements (ESTROVEN ENERGY PO) Take 1 tablet by mouth daily.   ? omeprazole (PRILOSEC) 40 MG capsule Take 1 capsule (40 mg total) by mouth 2 (two) times daily.  ? potassium chloride SA (KLOR-CON) 20 MEQ tablet Take 1 tablet (20 mEq total) by mouth daily with breakfast.  ? Spacer/Aero-Holding Chambers (OPTICHAMBER DIAMOND) MISC To assist PRN albuterol  ? sulfamethoxazole-trimethoprim (BACTRIM DS) 800-160 MG tablet Take 2 tablets by mouth 2 (two) times daily.  ? topiramate (TOPAMAX) 25 MG tablet Take 1 tablet (25 mg total) by mouth 2 (two) times daily.  ? valACYclovir (VALTREX) 1000 MG tablet Take 2 tablets (2,000 mg total) by mouth See admin instructions. Take 2 g twice daily for 5 days as needed for fever blisters  ? venlafaxine XR (EFFEXOR-XR) 75 MG 24 hr capsule Take 3 capsules (225 mg total) by mouth daily with breakfast. TAKE 3 CAPSULES BY MOUTH ONCE DAILY WITH BREAKFAST  ? vitamin B-12 (CYANOCOBALAMIN) 1000 MCG tablet Take 6,000 mcg by mouth daily.  ? vitamin E 400 UNIT capsule Take 1,600 Units by mouth daily.  ?  XIIDRA 5 % SOLN Place 1 drop into both eyes daily.  ? ?No facility-administered encounter medications on file as of 10/17/2021.  ? ?Care Gaps: ?Zoster Vaccines ?COVID-19 Vaccine Booster 3 ?PNA Vaccine ?Mammogram ?Dexa Scan ? ?Star Rating Drugs: ?Atorvastatin 40 mg last filled on 09/25/2021 for a 30-Day supply with [facility] ? ?BP Readings from Last 3 Encounters:  ?10/08/21 135/75  ?10/01/21 137/86  ?09/03/21 (!)  169/76  ?  ?Lab Results  ?Component Value Date  ? HGBA1C 5.3 06/26/2021  ?  ?Patient obtains medications through Adherence Packaging  30 Days  ? ?Last adherence delivery included:  ?Meloxicam 15 mg tablet- Take one tablet by mouth once daily (Bedtime) ?Leflunomide 10 mg tablet- Take one tablet by mouth once daily (Bedtime) ?Ezetimibe (Zetia) 10 mg tablet Take one tablet by mouth daily (Breakfast) ?Atorvastatin 40 mg tablet- Take one tablet by mouth daily (Breakfast) ?Carvedilol 12.5 mg tablet - Take on tablet by mouth twice daily (Breakfast, Bedtime) ?Eszopiclone 3 mg tablet - Take one tablet by mouth nightly (Bedtime) ?Trelegy 100-62.5-25 MCG- Inhale 1 puff daily ?Topiramate 25 mg tablet-Take one tablet by mouth twice daily (Breakfast, Bedtime) ?Linzess 145 mcg- Take one tablet by mouth daily (Before Breakfast) (Vials) ?Potassium 20 mEq - Take one capsule daily (Breakfast) ?Gabapentin 400 mg tablet- Take one tablet by mouth three times daily (Breakfast, Lunch, Bedtime) ?Aripiprazole 10 mg tablet- Take one tablet by mouth daily (Breakfast) ?Buspirone 30 mg tablet- Take one tablet by mouth three times daily (Breakfast, Lunch, Bedtime) ?Omeprazole 40 mg- Take one capsule by mouth two times a day (Breakfast, Bedtime) ?Venlafaxine ER 75 mg capsule- Take three capsules by mouth daily (Breakfast) ?Albuterol 90 mcg 2 puffs every 6 hours prn  ? ?Patient declined medications last month: ?Patient did not decline any medications last month ? ?Patient is due for next adherence delivery on: 10/29/2021 (Wednesday) [facility]. ?Called patient and reviewed medications and coordinated delivery. ? ?This delivery to include: ?Leflunomide 20 mg tablet- Take one tablet by mouth once daily (Bedtime) ?Buspirone 30 mg tablet- Take one tablet by mouth three times daily (Breakfast, Lunch, Bedtime) ?Aripiprazole 10 mg tablet- Take one tablet by mouth daily (Breakfast) ?Ezetimibe (Zetia) 10 mg tablet Take one tablet by mouth daily  (Breakfast) ?Atorvastatin 40 mg tablet- Take one tablet by mouth daily (Breakfast) ?Carvedilol 12.5 mg tablet - Take on tablet by mouth twice daily (Breakfast, Bedtime) ?Omeprazole 40 mg- Take one capsule by mouth two times a day (Breakfast, Bedtime) ?Venlafaxine ER 75 mg capsule- Take three capsules by mouth daily (Breakfast) ?Potassium 20 mEq - Take one capsule daily (Breakfast) ?Meloxicam 15 mg tablet- Take one tablet by mouth once daily (Bedtime) ?Linzess 145 mcg- Take one tablet by mouth daily (Before Breakfast) (Vials) ?Topiramate 25 mg tablet-Take one tablet by mouth twice daily (Breakfast, Bedtime) ?Trelegy 100-62.5-25 MCG- Inhale 1 puff daily ?Eszopiclone 3 mg tablet - Take one tablet by mouth nightly (Bedtime) ?Gabapentin 400 mg tablet- Take one tablet by mouth twice times daily (Breakfast, Bedtime) ? ?Patient declined the following medications: ?Albuterol 90 mcg 2 puffs every 6 hours prn - PRN Medication patient has ample supply at this time ? ?Patient needs refills for: ?Venlafaxine ER 75 mg, Omeprazole 40 mg (CPP can refill these medications as they are prescribed by the PCP) ? ?Confirmed delivery date of 10/29/2021 [facility], advised patient that pharmacy will contact them the morning of delivery. ? ?Patient reports that her Gabapentin has been changed to twice daily for Breakfast and Bedtime.  Pharmacy will be notified.  ? ?Patient has a scheduled telephone appointment with Junius Argyle, CPP on 01/19/2022 @ 1100 ? ? ?Lynann Bologna, CPA/CMA ?Clinical Pharmacist Assistant ?Phone: [phone]  ? ? ?

## 2021-10-21 ENCOUNTER — Ambulatory Visit
Payer: Medicare Other | Attending: Student in an Organized Health Care Education/Training Program | Admitting: Student in an Organized Health Care Education/Training Program

## 2021-10-21 ENCOUNTER — Encounter: Payer: Self-pay | Admitting: Student in an Organized Health Care Education/Training Program

## 2021-10-21 ENCOUNTER — Other Ambulatory Visit: Payer: Self-pay

## 2021-10-21 VITALS — BP 151/93 | HR 72 | Temp 96.8°F | Resp 18 | Ht 69.0 in | Wt 172.0 lb

## 2021-10-21 DIAGNOSIS — G8929 Other chronic pain: Secondary | ICD-10-CM | POA: Insufficient documentation

## 2021-10-21 DIAGNOSIS — Z96641 Presence of right artificial hip joint: Secondary | ICD-10-CM | POA: Diagnosis not present

## 2021-10-21 DIAGNOSIS — G894 Chronic pain syndrome: Secondary | ICD-10-CM | POA: Insufficient documentation

## 2021-10-21 DIAGNOSIS — M5030 Other cervical disc degeneration, unspecified cervical region: Secondary | ICD-10-CM | POA: Diagnosis not present

## 2021-10-21 DIAGNOSIS — M961 Postlaminectomy syndrome, not elsewhere classified: Secondary | ICD-10-CM | POA: Insufficient documentation

## 2021-10-21 DIAGNOSIS — M47812 Spondylosis without myelopathy or radiculopathy, cervical region: Secondary | ICD-10-CM | POA: Insufficient documentation

## 2021-10-21 DIAGNOSIS — M5416 Radiculopathy, lumbar region: Secondary | ICD-10-CM | POA: Diagnosis not present

## 2021-10-21 DIAGNOSIS — G629 Polyneuropathy, unspecified: Secondary | ICD-10-CM | POA: Diagnosis not present

## 2021-10-21 MED ORDER — GABAPENTIN 400 MG PO CAPS: 400.0000 mg | ORAL_CAPSULE | Freq: Two times a day (BID) | ORAL | 5 refills | Status: DC

## 2021-10-21 MED ORDER — OXYCODONE-ACETAMINOPHEN 5-325 MG PO TABS: 1.0000 | ORAL_TABLET | Freq: Three times a day (TID) | ORAL | 0 refills | Status: AC | PRN

## 2021-10-21 MED ORDER — OXYCODONE-ACETAMINOPHEN 5-325 MG PO TABS
1.0000 | ORAL_TABLET | Freq: Three times a day (TID) | ORAL | 0 refills | Status: DC | PRN
Start: 2021-12-20 — End: 2022-01-15

## 2021-10-21 NOTE — Progress Notes (Signed)
PROVIDER NOTE: Information contained herein reflects review and annotations entered in association with encounter. Interpretation of such information and data should be left to medically-trained personnel. Information provided to patient can be located elsewhere in the medical record under "Patient Instructions". Document created using STT-dictation technology, any transcriptional errors that may result from process are unintentional.    Patient: Gabrielle Pineda  Service Category: E/M  Provider: [name], MD  DOB: 21-Aug-1955  DOS: 10/21/2021  Specialty: Interventional Pain Management  MRN: [id number]  Setting: Ambulatory outpatient  PCP: Gwyneth Sprout, FNP  Type: Established Patient    Referring Provider: Gwyneth Sprout, FNP  Location: Office  Delivery: Face-to-face     HPI  Gabrielle Pineda, a 66 y.o. year old female, is here today because of her Chronic pain syndrome [G89.4]. Ms. Sonnier primary complain today is Back Pain (low)  Last encounter: My last encounter with her was on 10/02/21  Pertinent problems: Gabrielle Pineda has Closed compression fracture of L5 lumbar vertebra; Cervical radiculopathy; Neuropathy; Spinal stenosis, lumbar region, with neurogenic claudication; Spondylosis of cervical region without myelopathy or radiculopathy; Cervical fusion syndrome; Chronic pain syndrome; Postlaminectomy syndrome, lumbar region; Chronic radicular lumbar pain; and Status post total hip replacement, right on their pertinent problem list. Pain Assessment: Severity of Chronic pain is reported as a 6 /10. Location: Back Lower/radites down the right leg to the knee in the back and side. Onset: More than a month ago. Quality: Aching, Sharp, Throbbing, Constant. Timing: Constant. Modifying factor(s): meds. Vitals:  height is 5' 9"  (1.753 m) and weight is 172 lb (78 kg). Her temperature is 96.8 F (36 C) (abnormal). Her blood pressure is 151/93 (abnormal) and her pulse is 72. Her respiration is 18  and oxygen saturation is 100%.   Reason for encounter:  Patient follows up for medication management.  At her last visit, her gabapentin dose was decreased to 400 mg 3 times a day given concern for cognitive dysfunction and forgetfulness.  She states that she has not noticed any significant difference in her cognitive function after decreasing her gabapentin.  She has not noticed an increase in her pain.  I recommend that she decrease her gabapentin even further to 400 mg twice daily to see if that improves her cognitive dysfunction and forgetfulness.  She continues to have persistent low back and right hip pain. I will also refill Percocet today, 5 mg TID prn   ROS  Constitutional: Denies any fever or chills Gastrointestinal: No reported hemesis, hematochezia, vomiting, or acute GI distress Musculoskeletal:  Low back, right hip pain Neurological: No reported episodes of acute onset apraxia, aphasia, dysarthria, agnosia, amnesia, paralysis, loss of coordination, or loss of consciousness  Medication Review  ARIPiprazole, Eszopiclone, Fluticasone-Umeclidin-Vilant, Lifitegrast, Misc Natural Products, acyclovir ointment, albuterol, atorvastatin, busPIRone, carvedilol, ezetimibe, gabapentin, ipratropium, leflunomide, linaclotide, meloxicam, multivitamin with minerals, omeprazole, optichamber diamond, oxyCODONE-acetaminophen, potassium chloride SA, sulfamethoxazole-trimethoprim, topiramate, valACYclovir, venlafaxine XR, vitamin B-12, and vitamin E  History Review  Allergy: Gabrielle Pineda is allergic to plaquenil [hydroxychloroquine]. Drug: Gabrielle Pineda  reports no history of drug use. Alcohol:  reports no history of alcohol use. Tobacco:  reports that she quit smoking about a year ago. Her smoking use included cigarettes. She has a 40.00 pack-year smoking history. She has never used smokeless tobacco. Social: Gabrielle Pineda  reports that she quit smoking about a year ago. Her smoking use included  cigarettes. She has a 40.00 pack-year smoking history. She has never used smokeless tobacco. She reports  that she does not drink alcohol and does not use drugs. Medical:  has a past medical history of Alcohol abuse, Anemia, Anxiety, Arthritis, Cervicalgia, Cirrhosis ([place]) (1994), COPD (chronic obstructive pulmonary disease) ([place]), Depression, Difficult intubation, Dyspnea, GERD (gastroesophageal reflux disease), Headache, Heart murmur, Hypertension, Other and unspecified hyperlipidemia, Status post insertion of spinal cord stimulator, and Tachycardia. Surgical: Gabrielle Pineda  has a past surgical history that includes neck disc surgery; Breast enhancement surgery (1981); Esophagogastroduodenoscopy; Tonsillectomy and adenoidectomy (1973 ); Carpal tunnel release (11/11/2011); Carpal tunnel release (2013); Rotator cuff repair (06/16/2016); Rotator cuff repair; Colonoscopy with propofol (N/A, 11/01/2017); Augmentation mammaplasty (Bilateral, 1982); Anterior cervical decomp/discectomy fusion (2012, 2015, 2018); Lumbar laminectomy/decompression microdiscectomy (N/A, 08/15/2019); Spinal cord stimulator insertion (N/A, 03/17/2021); and Total hip arthroplasty (Right, 04/15/2021). Family: family history includes Alcohol abuse in her father and mother; Bipolar disorder in her mother; Pancreatic cancer in her father; Suicidality in her mother.  Laboratory Chemistry Profile   Renal Lab Results  Component Value Date   BUN 12 04/16/2021   CREATININE 0.99 04/16/2021   BCR 7 (L) 11/08/2020   GFRAA 72 05/06/2020   GFRNONAA >60 04/16/2021    Hepatic Lab Results  Component Value Date   AST 19 04/04/2021   ALT 18 04/04/2021   ALBUMIN 3.4 (L) 04/04/2021   ALKPHOS 186 (H) 04/04/2021   LIPASE 63 03/23/2018    Electrolytes Lab Results  Component Value Date   NA 139 04/16/2021   K 3.5 04/16/2021   CL 111 04/16/2021   CALCIUM 8.3 (L) 04/16/2021   PHOS 3.2 05/06/2020    Bone No results found for: VD25OH, VD125OH2TOT,  BL3903ES9, QZ3007MA2, 25OHVITD1, 25OHVITD2, 25OHVITD3, TESTOFREE, TESTOSTERONE  Inflammation (CRP: Acute Phase) (ESR: Chronic Phase) No results found for: CRP, ESRSEDRATE, LATICACIDVEN       Note: Above Lab results reviewed.  Recent Imaging Review  NM Bone Scan 3 Phase CLINICAL DATA:  RIGHT hip replacement. RIGHT hip pain. Hip replacement August 22  EXAM: NUCLEAR MEDICINE 3-PHASE BONE SCAN  TECHNIQUE: Radionuclide angiographic images, immediate static blood pool images, and 3-hour delayed static images were obtained of the hips after intravenous injection of radiopharmaceutical.  RADIOPHARMACEUTICALS:  21.4 mCi Tc-68mMDP IV  COMPARISON:  None.  FINDINGS: Vascular phase: No asymmetric or increased blood flow to the LEFT or RIGHT hip.  Blood pool phase: Increased blood pool activity in the soft tissues about the RIGHT greater trochanter region.  Delayed phase: Moderate diffuse radiotracer accumulation associated with the proximal RIGHT femur surrounding the femoral stem of the RIGHT hip prosthetic. More mild uptake in the RIGHT acetabulum. No focal uptake.  IMPRESSION: Moderate diffuse uptake within the proximal RIGHT femur surrounding the prosthetic . No hypervascular activity to suggest infection. No focality.  Electronically Signed   By: SSuzy BouchardM.D.   On: 07/08/2021 15:40 Note: Reviewed        Physical Exam  General appearance: Well nourished, well developed, and well hydrated. In no apparent acute distress Mental status: Alert, oriented x 3 (person, place, & time)       Respiratory: No evidence of acute respiratory distress Eyes: PERLA Vitals: BP (!) 151/93 (BP Location: Right Arm, Patient Position: Sitting, Cuff Size: Normal)    Pulse 72    Temp (!) 96.8 F (36 C)    Resp 18    Ht 5' 9"  (1.753 m)    Wt 172 lb (78 kg)    SpO2 100%    BMI 25.40 kg/m  BMI: Estimated body mass index is  25.4 kg/m as calculated from the following:   Height as of this  encounter: 5' 9"  (1.753 m).   Weight as of this encounter: 172 lb (78 kg). Ideal: Ideal body weight: 66.2 kg (145 lb 15.1 oz) Adjusted ideal body weight: 70.9 kg (156 lb 5.9 oz)    Lumbar Spine Area Exam  Skin & Axial Inspection: Well healed scar from previous spine surgery detected Alignment: Symmetrical Functional ROM: Pain restricted ROM affecting both sides Stability: No instability detected Muscle Tone/Strength: Functionally intact. No obvious neuro-muscular anomalies detected. Sensory (Neurological): Dermatomal pain pattern Palpation: No palpable anomalies       Provocative Tests: Hyperextension/rotation test: deferred today       Lumbar quadrant test (Kemp's test): (+) bilateral for foraminal stenosis right greater than left Lateral bending test: (+) ipsilateral radicular pain, bilaterally. Positive for bilateral foraminal stenosis.  Right greater than left  Gait & Posture Assessment  Ambulation: Limited Gait: Antalgic Posture: Difficulty standing up straight, due to pain  Lower Extremity Exam      Side: Right lower extremity   Side: Left lower extremity  Stability: No instability observed           Stability: No instability observed          Skin & Extremity Inspection: Skin color, temperature, and hair growth are WNL. No peripheral edema or cyanosis. No masses, redness, swelling, asymmetry, or associated skin lesions. No contractures.   Skin & Extremity Inspection: Skin color, temperature, and hair growth are WNL. No peripheral edema or cyanosis. No masses, redness, swelling, asymmetry, or associated skin lesions. No contractures.  Functional ROM: Pain restricted ROM for hip and knee joints Limited SLR (straight leg raise)   Functional ROM: Pain restricted ROM for hip and knee joints Limited SLR (straight leg raise)  Muscle Tone/Strength: Functionally intact. No obvious neuro-muscular anomalies detected.   Muscle Tone/Strength: Functionally intact. No obvious neuro-muscular  anomalies detected.  Sensory (Neurological): Dermatomal pain pattern         Sensory (Neurological): Dermatomal pain pattern        DTR: Patellar: deferred today Achilles: deferred today Plantar: deferred today   DTR: Patellar: deferred today Achilles: deferred today Plantar: deferred today  Palpation: No palpable anomalies   Palpation: No palpable anomalies     Assessment   Status Diagnosis  Controlled Controlled Controlled 1. Chronic pain syndrome   2. Spondylosis of cervical region without myelopathy or radiculopathy   3. Neuropathy   4. Failed back surgical syndrome   5. Postlaminectomy syndrome, lumbar region   6. DDD (degenerative disc disease), cervical   7. Lumbar radiculopathy (R>L)   8. History of right hip replacement   9. Chronic radicular lumbar pain      Updated Problems: No problems updated.   Plan of Care  Problem-specific:  No problem-specific Assessment & Plan notes found for this encounter.  Ms. Deneise Getty Grigg has a current medication list which includes the following long-term medication(s): albuterol, aripiprazole, atorvastatin, carvedilol, eszopiclone, ezetimibe, ipratropium, linzess, omeprazole, potassium chloride sa, optichamber diamond, topiramate, venlafaxine xr, and gabapentin.  Pharmacotherapy (Medications Ordered): Meds ordered this encounter  Medications   oxyCODONE-acetaminophen (PERCOCET) 5-325 MG tablet    Sig: Take 1 tablet by mouth every 8 (eight) hours as needed for severe pain. Must last 30 days.    Dispense:  90 tablet    Refill:  0    Chronic Pain: STOP Act (Not applicable) Fill 1 day early if closed on refill date. Avoid benzodiazepines within  8 hours of opioids   oxyCODONE-acetaminophen (PERCOCET) 5-325 MG tablet    Sig: Take 1 tablet by mouth every 8 (eight) hours as needed for severe pain. Must last 30 days.    Dispense:  90 tablet    Refill:  0    Chronic Pain: STOP Act (Not applicable) Fill 1 day early if closed on  refill date. Avoid benzodiazepines within 8 hours of opioids   oxyCODONE-acetaminophen (PERCOCET) 5-325 MG tablet    Sig: Take 1 tablet by mouth every 8 (eight) hours as needed for severe pain. Must last 30 days.    Dispense:  90 tablet    Refill:  0    Chronic Pain: STOP Act (Not applicable) Fill 1 day early if closed on refill date. Avoid benzodiazepines within 8 hours of opioids   gabapentin (NEURONTIN) 400 MG capsule    Sig: Take 1 capsule (400 mg total) by mouth 2 (two) times daily.    Dispense:  60 capsule    Refill:  5   Orders:  Orders Placed This Encounter  Procedures   ToxASSURE Select 13 (MW), Urine    Volume: 30 ml(s). Minimum 3 ml of urine is needed. Document temperature of fresh sample. Indications: Long term (current) use of opiate analgesic [phone])    Order Specific Question:   Release to patient    Answer:   Immediate   Follow-up plan:   Return in about 3 months (around 01/21/2022) for Medication Management, in person.    Recent Visits Date Type Provider Dept  10/02/21 Office Visit [name], MD [facility]  09/03/21 Office Visit [name], MD [facility]  Showing recent visits within past 90 days and meeting all other requirements Today's Visits Date Type Provider Dept  10/21/21 Office Visit [name], MD [facility]  Showing today's visits and meeting all other requirements Future Appointments Date Type Provider Dept  01/15/22 Appointment [name], MD [facility]  Showing future appointments within next 90 days and meeting all other requirements  I discussed the assessment and treatment plan with the patient. The patient was provided an opportunity to ask questions and all were answered. The patient agreed with the plan and demonstrated an understanding of the instructions.  Patient advised to call back or seek an in-person evaluation if the symptoms or condition worsens.  Duration of encounter:  5mnutes.  Note by: BGillis Santa MD Date: 10/21/2021; Time: 11:16 AM

## 2021-10-21 NOTE — Progress Notes (Signed)
Nursing Pain Medication Assessment:  ?Safety precautions to be maintained throughout the outpatient stay will include: orient to surroundings, keep bed in low position, maintain call bell within reach at all times, provide assistance with transfer out of bed and ambulation.  ?Medication Inspection Compliance: Pill count conducted under aseptic conditions, in front of the patient. Neither the pills nor the bottle was removed from the patient's sight at any time. Once count was completed pills were immediately returned to the patient in their original bottle. ? ?Medication: Oxycodone/APAP ?Pill/Patch Count:  2 of 90 pills remain ?Pill/Patch Appearance: Markings consistent with prescribed medication ?Bottle Appearance: Standard pharmacy container. Clearly labeled. ?Filled Date: 01 / 19 / 2023 ?Last Medication intake:  Today ?

## 2021-10-22 MED ORDER — OMEPRAZOLE 40 MG PO CPDR: 40.0000 mg | DELAYED_RELEASE_CAPSULE | Freq: Two times a day (BID) | ORAL | 1 refills | Status: DC

## 2021-10-22 MED ORDER — VENLAFAXINE HCL ER 75 MG PO CP24: 225.0000 mg | ORAL_CAPSULE | Freq: Every day | ORAL | 1 refills | Status: DC

## 2021-10-22 NOTE — Addendum Note (Signed)
Addended by: Daron Offer A on: 10/22/2021 12:29 PM ? ? Modules accepted: Orders ? ?

## 2021-10-23 ENCOUNTER — Encounter: Payer: Self-pay | Admitting: Family Medicine

## 2021-10-23 DIAGNOSIS — G479 Sleep disorder, unspecified: Secondary | ICD-10-CM

## 2021-10-24 LAB — TOXASSURE SELECT 13 (MW), URINE

## 2021-11-03 ENCOUNTER — Ambulatory Visit (INDEPENDENT_AMBULATORY_CARE_PROVIDER_SITE_OTHER): Payer: Medicare Other

## 2021-11-03 VITALS — Ht 69.0 in | Wt 172.0 lb

## 2021-11-03 DIAGNOSIS — Z0000 Encounter for general adult medical examination without abnormal findings: Secondary | ICD-10-CM

## 2021-11-03 NOTE — Patient Instructions (Addendum)
Gabrielle Pineda , ?Thank you for taking time to come for your Medicare Wellness Visit. I appreciate your ongoing commitment to your health goals. Please review the following plan we discussed and let me know if I can assist you in the future.  ? ?Screening recommendations/referrals: ?Colonoscopy: 11/01/17 ?Mammogram: 03/27/19 ?Bone Density: n/d ?Recommended yearly ophthalmology/optometry visit for glaucoma screening and checkup ?Recommended yearly dental visit for hygiene and checkup ? ?Vaccinations: ?Influenza vaccine: 2/15/3 ?Pneumococcal vaccine: 04/19/20 ?Tdap vaccine: 11/15/15 ?Shingles vaccine: n/d   ?Covid-19:11/21/19, 12/12/19 ? ?Advanced directives: no ? ?Conditions/risks identified: none ? ?Next appointment: Follow up in one year for your annual wellness visit - 11/05/22 @ 9:40am by phone ? ? ?Preventive Care 66 Years and Older, Female ?Preventive care refers to lifestyle choices and visits with your health care provider that can promote health and wellness. ?What does preventive care include? ?A yearly physical exam. This is also called an annual well check. ?Dental exams once or twice a year. ?Routine eye exams. Ask your health care provider how often you should have your eyes checked. ?Personal lifestyle choices, including: ?Daily care of your teeth and gums. ?Regular physical activity. ?Eating a healthy diet. ?Avoiding tobacco and drug use. ?Limiting alcohol use. ?Practicing safe sex. ?Taking low-dose aspirin every day. ?Taking vitamin and mineral supplements as recommended by your health care provider. ?What happens during an annual well check? ?The services and screenings done by your health care provider during your annual well check will depend on your age, overall health, lifestyle risk factors, and family history of disease. ?Counseling  ?Your health care provider may ask you questions about your: ?Alcohol use. ?Tobacco use. ?Drug use. ?Emotional well-being. ?Home and relationship well-being. ?Sexual  activity. ?Eating habits. ?History of falls. ?Memory and ability to understand (cognition). ?Work and work Statistician. ?Reproductive health. ?Screening  ?You may have the following tests or measurements: ?Height, weight, and BMI. ?Blood pressure. ?Lipid and cholesterol levels. These may be checked every 5 years, or more frequently if you are over 66 years old. ?Skin check. ?Lung cancer screening. You may have this screening every year starting at age 30 if you have a 30-pack-year history of smoking and currently smoke or have quit within the past 15 years. ?Fecal occult blood test (FOBT) of the stool. You may have this test every year starting at age 12. ?Flexible sigmoidoscopy or colonoscopy. You may have a sigmoidoscopy every 5 years or a colonoscopy every 10 years starting at age 29. ?Hepatitis C blood test. ?Hepatitis B blood test. ?Sexually transmitted disease (STD) testing. ?Diabetes screening. This is done by checking your blood sugar (glucose) after you have not eaten for a while (fasting). You may have this done every 1-3 years. ?Bone density scan. This is done to screen for osteoporosis. You may have this done starting at age 40. ?Mammogram. This may be done every 1-2 years. Talk to your health care provider about how often you should have regular mammograms. ?Talk with your health care provider about your test results, treatment options, and if necessary, the need for more tests. ?Vaccines  ?Your health care provider may recommend certain vaccines, such as: ?Influenza vaccine. This is recommended every year. ?Tetanus, diphtheria, and acellular pertussis (Tdap, Td) vaccine. You may need a Td booster every 10 years. ?Zoster vaccine. You may need this after age 43. ?Pneumococcal 13-valent conjugate (PCV13) vaccine. One dose is recommended after age 88. ?Pneumococcal polysaccharide (PPSV23) vaccine. One dose is recommended after age 12. ?Talk to your health care provider  about which screenings and vaccines  you need and how often you need them. ?This information is not intended to replace advice given to you by your health care provider. Make sure you discuss any questions you have with your health care provider. ?Document Released: 08/30/2015 Document Revised: 04/22/2016 Document Reviewed: 06/04/2015 ?Elsevier Interactive Patient Education ? 2017 [place]. ? ?Fall Prevention in the Home ?Falls can cause injuries. They can happen to people of all ages. There are many things you can do to make your home safe and to help prevent falls. ?What can I do on the outside of my home? ?Regularly fix the edges of walkways and driveways and fix any cracks. ?Remove anything that might make you trip as you walk through a door, such as a raised step or threshold. ?Trim any bushes or trees on the path to your home. ?Use bright outdoor lighting. ?Clear any walking paths of anything that might make someone trip, such as rocks or tools. ?Regularly check to see if handrails are loose or broken. Make sure that both sides of any steps have handrails. ?Any raised decks and porches should have guardrails on the edges. ?Have any leaves, snow, or ice cleared regularly. ?Use sand or salt on walking paths during winter. ?Clean up any spills in your garage right away. This includes oil or grease spills. ?What can I do in the bathroom? ?Use night lights. ?Install grab bars by the toilet and in the tub and shower. Do not use towel bars as grab bars. ?Use non-skid mats or decals in the tub or shower. ?If you need to sit down in the shower, use a plastic, non-slip stool. ?Keep the floor dry. Clean up any water that spills on the floor as soon as it happens. ?Remove soap buildup in the tub or shower regularly. ?Attach bath mats securely with double-sided non-slip rug tape. ?Do not have throw rugs and other things on the floor that can make you trip. ?What can I do in the bedroom? ?Use night lights. ?Make sure that you have a light by your bed that  is easy to reach. ?Do not use any sheets or blankets that are too big for your bed. They should not hang down onto the floor. ?Have a firm chair that has side arms. You can use this for support while you get dressed. ?Do not have throw rugs and other things on the floor that can make you trip. ?What can I do in the kitchen? ?Clean up any spills right away. ?Avoid walking on wet floors. ?Keep items that you use a lot in easy-to-reach places. ?If you need to reach something above you, use a strong step stool that has a grab bar. ?Keep electrical cords out of the way. ?Do not use floor polish or wax that makes floors slippery. If you must use wax, use non-skid floor wax. ?Do not have throw rugs and other things on the floor that can make you trip. ?What can I do with my stairs? ?Do not leave any items on the stairs. ?Make sure that there are handrails on both sides of the stairs and use them. Fix handrails that are broken or loose. Make sure that handrails are as long as the stairways. ?Check any carpeting to make sure that it is firmly attached to the stairs. Fix any carpet that is loose or worn. ?Avoid having throw rugs at the top or bottom of the stairs. If you do have throw rugs, attach them to the floor  with carpet tape. ?Make sure that you have a light switch at the top of the stairs and the bottom of the stairs. If you do not have them, ask someone to add them for you. ?What else can I do to help prevent falls? ?Wear shoes that: ?Do not have high heels. ?Have rubber bottoms. ?Are comfortable and fit you well. ?Are closed at the toe. Do not wear sandals. ?If you use a stepladder: ?Make sure that it is fully opened. Do not climb a closed stepladder. ?Make sure that both sides of the stepladder are locked into place. ?Ask someone to hold it for you, if possible. ?Clearly mark and make sure that you can see: ?Any grab bars or handrails. ?First and last steps. ?Where the edge of each step is. ?Use tools that help you  move around (mobility aids) if they are needed. These include: ?Canes. ?Walkers. ?Scooters. ?Crutches. ?Turn on the lights when you go into a dark area. Replace any light bulbs as soon as they burn out. ?Se

## 2021-11-03 NOTE — Progress Notes (Signed)
?Virtual Visit via Telephone Note ? ?I connected with  Gabrielle Pineda on 11/03/21 at  9:30 AM EDT by telephone and verified that I am speaking with the correct person using two identifiers. ? ?Location: ?Patient: home ?Provider: [facility] ?Persons participating in the virtual visit: patient/Nurse Health Advisor ?  ?I discussed the limitations, risks, security and privacy concerns of performing an evaluation and management service by telephone and the availability of in person appointments. The patient expressed understanding and agreed to proceed. ? ?Interactive audio and video telecommunications were attempted between this nurse and patient, however failed, due to patient having technical difficulties OR patient did not have access to video capability.  We continued and completed visit with audio only. ? ?Some vital signs may be absent or patient reported.  ? ?Dionisio David, LPN ? ?Subjective:  ? Gabrielle Pineda is a 66 y.o. female who presents for Medicare Annual (Subsequent) preventive examination. ? ?Review of Systems    ? ?  ? ?   ?Objective:  ?  ?Today's Vitals  ? 11/03/21 0931  ?PainSc: 7   ? ?There is no height or weight on file to calculate BMI. ? ?Advanced Directives 10/21/2021 06/24/2021 04/15/2021 04/15/2021 04/04/2021 03/17/2021 03/06/2021  ?Does Patient Have a Medical Advance Directive? No No No No - No No  ?Type of Advance Directive - - - - - - -  ?Copy of [place] in Chart? - - - - - - -  ?Would patient like information on creating a medical advance directive? No - Patient declined - No - Patient declined No - Patient declined No - Patient declined No - Patient declined No - Patient declined  ?Pre-existing out of facility DNR order (yellow form or pink MOST form) - - - - - - -  ? ? ?Current Medications (verified) ?Outpatient Encounter Medications as of 11/03/2021  ?Medication Sig  ? acyclovir ointment (ZOVIRAX) 5 % SMARTSIG:Topical Every 3 Hours  ? albuterol (VENTOLIN HFA) 108 (90 Base)  MCG/ACT inhaler INHALE TWO PUFFS BY MOUTH INTO LUNGS every SIX hours AS NEEDED FOR WHEEZING AND/OR SHORTNESS OF BREATH  ? ARIPiprazole (ABILIFY) 10 MG tablet Take 1 tablet (10 mg total) by mouth daily.  ? atorvastatin (LIPITOR) 40 MG tablet TAKE ONE TABLET BY MOUTH EVERY MORNING  ? busPIRone (BUSPAR) 30 MG tablet Take 1 tablet (30 mg total) by mouth 3 (three) times daily.  ? carvedilol (COREG) 12.5 MG tablet TAKE ONE TABLET BY MOUTH EVERY MORNING and TAKE ONE TABLET BY MOUTH EVERYDAY AT BEDTIME  ? Eszopiclone 3 MG TABS Take 1 tablet (3 mg total) by mouth at bedtime. Take immediately before bedtime  ? ezetimibe (ZETIA) 10 MG tablet TAKE ONE TABLET BY MOUTH EVERY MORNING  ? Fluticasone-Umeclidin-Vilant (TRELEGY ELLIPTA) 100-62.5-25 MCG/ACT AEPB Inhale 1 puff into the lungs daily.  ? gabapentin (NEURONTIN) 400 MG capsule Take 1 capsule (400 mg total) by mouth 2 (two) times daily.  ? ipratropium (ATROVENT) 0.02 % nebulizer solution Take 2.5 mLs (0.5 mg total) by nebulization 4 (four) times daily as needed for wheezing or shortness of breath.  ? leflunomide (ARAVA) 20 MG tablet Take 20 mg by mouth at bedtime.  ? LINZESS 145 MCG CAPS capsule Take 145 mcg by mouth daily before breakfast.  ? meloxicam (MOBIC) 15 MG tablet Take 15 mg by mouth at bedtime.  ? Multiple Vitamin (MULTIVITAMIN WITH MINERALS) TABS tablet Take 1 tablet by mouth daily.  ? Nutritional Supplements (ESTROVEN ENERGY PO) Take 1  tablet by mouth daily.   ? omeprazole (PRILOSEC) 40 MG capsule Take 1 capsule (40 mg total) by mouth 2 (two) times daily.  ? oxyCODONE-acetaminophen (PERCOCET) 5-325 MG tablet Take 1 tablet by mouth every 8 (eight) hours as needed for severe pain. Must last 30 days.  ? [START ON 11/20/2021] oxyCODONE-acetaminophen (PERCOCET) 5-325 MG tablet Take 1 tablet by mouth every 8 (eight) hours as needed for severe pain. Must last 30 days.  ? [START ON 12/20/2021] oxyCODONE-acetaminophen (PERCOCET) 5-325 MG tablet Take 1 tablet by mouth every 8  (eight) hours as needed for severe pain. Must last 30 days.  ? potassium chloride SA (KLOR-CON) 20 MEQ tablet Take 1 tablet (20 mEq total) by mouth daily with breakfast.  ? Spacer/Aero-Holding Chambers (OPTICHAMBER DIAMOND) MISC To assist PRN albuterol  ? sulfamethoxazole-trimethoprim (BACTRIM DS) 800-160 MG tablet Take 2 tablets by mouth 2 (two) times daily.  ? topiramate (TOPAMAX) 25 MG tablet Take 1 tablet (25 mg total) by mouth 2 (two) times daily.  ? valACYclovir (VALTREX) 1000 MG tablet Take 2 tablets (2,000 mg total) by mouth See admin instructions. Take 2 g twice daily for 5 days as needed for fever blisters  ? venlafaxine XR (EFFEXOR-XR) 75 MG 24 hr capsule Take 3 capsules (225 mg total) by mouth daily with breakfast. TAKE 3 CAPSULES BY MOUTH ONCE DAILY WITH BREAKFAST  ? vitamin B-12 (CYANOCOBALAMIN) 1000 MCG tablet Take 6,000 mcg by mouth daily.  ? vitamin E 400 UNIT capsule Take 1,600 Units by mouth daily.  ? XIIDRA 5 % SOLN Place 1 drop into both eyes daily.  ? ?No facility-administered encounter medications on file as of 11/03/2021.  ? ? ?Allergies (verified) ?Plaquenil [hydroxychloroquine]  ? ?History: ?Past Medical History:  ?Diagnosis Date  ? Alcohol abuse   ? Anemia   ? Anxiety   ? Arthritis   ? Cervicalgia   ? Cirrhosis ([place]) 1994  ? COPD (chronic obstructive pulmonary disease) ([place])   ? Depression   ? Difficult intubation   ? has plates and screws in neck  ? Dyspnea   ? GERD (gastroesophageal reflux disease)   ? Headache   ? Heart murmur   ? on heard when pt is lying  ? Hypertension   ? Other and unspecified hyperlipidemia   ? Status post insertion of spinal cord stimulator   ? Tachycardia   ? d/t questionable anxiety happens every 5- 10 years, sees [facility]  ? ?Past Surgical History:  ?Procedure Laterality Date  ? ANTERIOR CERVICAL DECOMP/DISCECTOMY FUSION  2012, 2015, 2018  ? x3  ? AUGMENTATION MAMMAPLASTY Bilateral 1982  ? [place]  ? CARPAL TUNNEL RELEASE   11/11/2011  ? Procedure: CARPAL TUNNEL RELEASE;  Surgeon: Floyce Stakes, MD;  Location: [facility];  Service: Neurosurgery;  Laterality: Right;  Right Median Nerve Decompression  ? CARPAL TUNNEL RELEASE  2013  ? COLONOSCOPY WITH PROPOFOL N/A 11/01/2017  ? Procedure: COLONOSCOPY WITH PROPOFOL;  Surgeon: Manya Silvas, MD;  Location[facility] END[facility]COPY;  Service: Endoscopy;  Laterality: N/A;  ? ESOPHAGOGASTRODUODENOSCOPY    ? LUMBAR LAMINECTOMY/DECOMPRESSION MICRODISCECTOMY N/A 08/15/2019  ? Procedure: Lumbar three to Sacral one Decompressive lumbar laminectomy;  Surgeon: Erline Levine, MD;  Location: [facility];  Service: Neurosurgery;  Laterality: N/A;  ? neck disc surgery    ? plates and screws in neck x 2  ? ROTATOR CUFF REPAIR  06/16/2016  ? right shoulder   ? ROTATOR CUFF REPAIR    ?  SPINAL CORD STIMULATOR INSERTION N/A 03/17/2021  ? Procedure: THORACIC SPINAL CORD STIMULATOR (PERCUTANEOUS) & PULSE GENERATOR PLACEMENT;  Surgeon: [name], MD;  Location: [facility] ORS;  Service: Neurosurgery;  Laterality: N/A;  ? [place]   ? TOTAL HIP ARTHROPLASTY Right 04/15/2021  ? Procedure: TOTAL HIP ARTHROPLASTY ANTERIOR APPROACH;  Surgeon: Hessie Knows, MD;  Location: [facility] ORS;  Service: Orthopedics;  Laterality: Right;  ? ?Family History  ?Problem Relation Age of Onset  ? Pancreatic cancer Father   ? Alcohol abuse Father   ? Alcohol abuse Mother   ? Bipolar disorder Mother   ? Suicidality Mother   ? Anesthesia problems Neg Hx   ? Breast cancer Neg Hx   ? ?Social History  ? ?Socioeconomic History  ? Marital status: Married  ?  Spouse name: Not on file  ? Number of children: Not on file  ? Years of education: Not on file  ? Highest education level: Not on file  ?Occupational History  ? Not on file  ?Tobacco Use  ? Smoking status: Former  ?  Packs/day: 1.00  ?  Years: 40.00  ?  Pack years: 40.00  ?  Types: Cigarettes  ?  Quit date: 10/30/2020  ?  Years since quitting: 1.0  ? Smokeless tobacco: Never   ?Vaping Use  ? Vaping Use: Former  ? Devices: vape occasionally  ?Substance and Sexual Activity  ? Alcohol use: No  ?  Comment: recovering alcoholic Since 8295   ? Drug use: No  ? Sexual activity: Never  ?O

## 2021-11-12 ENCOUNTER — Other Ambulatory Visit: Payer: Self-pay | Admitting: Cardiovascular Disease

## 2021-11-12 DIAGNOSIS — I10 Essential (primary) hypertension: Secondary | ICD-10-CM

## 2021-11-12 DIAGNOSIS — I471 Supraventricular tachycardia: Secondary | ICD-10-CM

## 2021-11-12 NOTE — Telephone Encounter (Signed)
Please schedule 12 month F/U appointment. Thank you! 

## 2021-11-17 ENCOUNTER — Encounter: Payer: Self-pay | Admitting: Family Medicine

## 2021-11-17 ENCOUNTER — Ambulatory Visit (INDEPENDENT_AMBULATORY_CARE_PROVIDER_SITE_OTHER): Payer: Medicare Other | Admitting: Family Medicine

## 2021-11-17 VITALS — BP 135/85 | HR 72 | Temp 98.6°F | Resp 16 | Wt 178.2 lb

## 2021-11-17 DIAGNOSIS — E789 Disorder of lipoprotein metabolism, unspecified: Secondary | ICD-10-CM | POA: Diagnosis not present

## 2021-11-17 DIAGNOSIS — Z0000 Encounter for general adult medical examination without abnormal findings: Secondary | ICD-10-CM

## 2021-11-17 DIAGNOSIS — F5101 Primary insomnia: Secondary | ICD-10-CM | POA: Diagnosis not present

## 2021-11-17 DIAGNOSIS — K5900 Constipation, unspecified: Secondary | ICD-10-CM | POA: Diagnosis not present

## 2021-11-17 DIAGNOSIS — E7800 Pure hypercholesterolemia, unspecified: Secondary | ICD-10-CM

## 2021-11-17 DIAGNOSIS — I10 Essential (primary) hypertension: Secondary | ICD-10-CM | POA: Diagnosis not present

## 2021-11-17 DIAGNOSIS — F331 Major depressive disorder, recurrent, moderate: Secondary | ICD-10-CM | POA: Diagnosis not present

## 2021-11-17 DIAGNOSIS — J449 Chronic obstructive pulmonary disease, unspecified: Secondary | ICD-10-CM

## 2021-11-17 DIAGNOSIS — Z23 Encounter for immunization: Secondary | ICD-10-CM | POA: Diagnosis not present

## 2021-11-17 MED ORDER — MONTELUKAST SODIUM 10 MG PO TABS: 10.0000 mg | ORAL_TABLET | Freq: Every day | ORAL | 11 refills | Status: DC

## 2021-11-17 MED ORDER — IPRATROPIUM BROMIDE 0.02 % IN SOLN: 0.5000 mg | Freq: Four times a day (QID) | RESPIRATORY_TRACT | 1 refills | Status: DC | PRN

## 2021-11-17 NOTE — Assessment & Plan Note (Signed)
Chronic, stable depression with insomnia ?PHQ 9 score reviewed at 6 today; Has upcoming appt with psych to further assist with treatment ?Currently taking abilify 10 mg, buspar 30 mg TID, effexor 225 mg ?Denies SI or HI ?

## 2021-11-17 NOTE — Progress Notes (Signed)
? ?I,Jana Robinson,acting as a scribe for Gwyneth Sprout, FNP.,have documented all relevant documentation on the behalf of Gwyneth Sprout, FNP,as directed by  Gwyneth Sprout, FNP while in the presence of Gwyneth Sprout, FNP.  ? ?Complete physical exam ? ? ?Patient: Gabrielle Pineda   DOB: August 13, 1956   66 y.o. Female  MRN: [id number] ?Visit Date: 11/17/2021 ? ?Today's healthcare provider: Gwyneth Sprout, FNP  ? ?Re Introduced to nurse practitioner role and practice setting.  All questions answered.  Discussed provider/patient relationship and expectations. ? ? ?Chief Complaint  ?Patient presents with  ? Annual Exam  ? ?Subjective  ?  ?Gabrielle Pineda is a 66 y.o. female who presents today for a complete physical exam.  ?She reports consuming a general diet. She generally feels well. She does not have a regular exercise routine. She reports sleeping poorly. She does have additional problems to discuss today.  ?Patient agreeable to Pneumonia vaccine today.   ? ? ?Past Medical History:  ?Diagnosis Date  ? Alcohol abuse   ? Anemia   ? Anxiety   ? Arthritis   ? Cervicalgia   ? Cirrhosis ([place]) 1994  ? COPD (chronic obstructive pulmonary disease) ([place])   ? Depression   ? Difficult intubation   ? has plates and screws in neck  ? Dyspnea   ? GERD (gastroesophageal reflux disease)   ? Headache   ? Heart murmur   ? on heard when pt is lying  ? Hypertension   ? Other and unspecified hyperlipidemia   ? Status post insertion of spinal cord stimulator   ? Tachycardia   ? d/t questionable anxiety happens every 5- 10 years, sees [facility]  ? ?Past Surgical History:  ?Procedure Laterality Date  ? ANTERIOR CERVICAL DECOMP/DISCECTOMY FUSION  2012, 2015, 2018  ? x3  ? AUGMENTATION MAMMAPLASTY Bilateral 1982  ? [place]  ? CARPAL TUNNEL RELEASE  11/11/2011  ? Procedure: CARPAL TUNNEL RELEASE;  Surgeon: Floyce Stakes, MD;  Location: [place] [facility];  Service: Neurosurgery;  Laterality: Right;  Right Median  Nerve Decompression  ? CARPAL TUNNEL RELEASE  2013  ? COLONOSCOPY WITH PROPOFOL N/A 11/01/2017  ? Procedure: COLONOSCOPY WITH PROPOFOL;  Surgeon: Manya Silvas, MD;  Location[facility];  Service: Endoscopy;  Laterality: N/A;  ? ESOPHAGOGASTRODUODENOSCOPY    ? LUMBAR LAMINECTOMY/DECOMPRESSION MICRODISCECTOMY N/A 08/15/2019  ? Procedure: Lumbar three to Sacral one Decompressive lumbar laminectomy;  Surgeon: Erline Levine, MD;  Location: [facility];  Service: Neurosurgery;  Laterality: N/A;  ? neck disc surgery    ? plates and screws in neck x 2  ? ROTATOR CUFF REPAIR  06/16/2016  ? right shoulder   ? ROTATOR CUFF REPAIR    ? SPINAL CORD STIMULATOR INSERTION N/A 03/17/2021  ? Procedure: THORACIC SPINAL CORD STIMULATOR (PERCUTANEOUS) & PULSE GENERATOR PLACEMENT;  Surgeon: [name], MD;  Location: [facility] ORS;  Service: Neurosurgery;  Laterality: N/A;  ? [place]   ? TOTAL HIP ARTHROPLASTY Right 04/15/2021  ? Procedure: TOTAL HIP ARTHROPLASTY ANTERIOR APPROACH;  Surgeon: Hessie Knows, MD;  Location: [facility] ORS;  Service: Orthopedics;  Laterality: Right;  ? ?Social History  ? ?Socioeconomic History  ? Marital status: Married  ?  Spouse name: Not on file  ? Number of children: Not on file  ? Years of education: Not on file  ? Highest education level: Not on file  ?Occupational History  ? Not on file  ?  Tobacco Use  ? Smoking status: Former  ?  Packs/day: 1.00  ?  Years: 40.00  ?  Pack years: 40.00  ?  Types: Cigarettes  ?  Quit date: 10/30/2020  ?  Years since quitting: 1.0  ? Smokeless tobacco: Never  ?Vaping Use  ? Vaping Use: Former  ? Devices: vape occasionally  ?Substance and Sexual Activity  ? Alcohol use: No  ?  Comment: recovering alcoholic Since 6270   ? Drug use: No  ? Sexual activity: Never  ?Other Topics Concern  ? Not on file  ?Social History Narrative  ? Married; full time; does not get regular exercise.   ? ?Social Determinants of Health  ? ?Financial Resource Strain: Low Risk   ?  Difficulty of Paying Living Expenses: Not very hard  ?Food Insecurity: No Food Insecurity  ? Worried About Charity fundraiser in the Last Year: Never true  ? Ran Out of Food in the Last Year: Never true  ?Transportation Needs: No Transportation Needs  ? Lack of Transportation (Medical): No  ? Lack of Transportation (Non-Medical): No  ?Physical Activity: Inactive  ? Days of Exercise per Week: 0 days  ? Minutes of Exercise per Session: 0 min  ?Stress: Stress Concern Present  ? Feeling of Stress : To some extent  ?Social Connections: Moderately Isolated  ? Frequency of Communication with Friends and Family: More than three times a week  ? Frequency of Social Gatherings with Friends and Family: More than three times a week  ? Attends Religious Services: Never  ? Active Member of Clubs or Organizations: No  ? Attends Archivist Meetings: Never  ? Marital Status: Married  ?Intimate Partner Violence: Not At Risk  ? Fear of Current or Ex-Partner: No  ? Emotionally Abused: No  ? Physically Abused: No  ? Sexually Abused: No  ? ?Family Status  ?Relation Name Status  ? Father  Deceased at age 44  ? Mother  Deceased at age 51  ?     suicide, drug OD   ? Neg Hx  (Not Specified)  ? ?Family History  ?Problem Relation Age of Onset  ? Pancreatic cancer Father   ? Alcohol abuse Father   ? Alcohol abuse Mother   ? Bipolar disorder Mother   ? Suicidality Mother   ? Anesthesia problems Neg Hx   ? Breast cancer Neg Hx   ? ?Allergies  ?Allergen Reactions  ? Plaquenil [Hydroxychloroquine] Rash  ?  ?Patient Care Team: ?Gwyneth Sprout, FNP as PCP - General (Family Medicine) ?Minna Merritts, MD as PCP - Cardiology (Cardiology) ?Manya Silvas, MD (Inactive) (Gastroenterology) ?Germaine Pomfret, [facility] (Pharmacist)  ? ?Medications: ?Outpatient Medications Prior to Visit  ?Medication Sig  ? acyclovir ointment (ZOVIRAX) 5 % SMARTSIG:Topical Every 3 Hours  ? albuterol (VENTOLIN HFA) 108 (90 Base) MCG/ACT inhaler INHALE TWO PUFFS  BY MOUTH INTO LUNGS every SIX hours AS NEEDED FOR WHEEZING AND/OR SHORTNESS OF BREATH  ? ARIPiprazole (ABILIFY) 10 MG tablet Take 1 tablet (10 mg total) by mouth daily.  ? atorvastatin (LIPITOR) 40 MG tablet TAKE ONE TABLET BY MOUTH EVERY MORNING  ? busPIRone (BUSPAR) 30 MG tablet Take 1 tablet (30 mg total) by mouth 3 (three) times daily.  ? carvedilol (COREG) 12.5 MG tablet TAKE ONE TABLET BY MOUTH EVERY MORNING and TAKE ONE TABLET BY MOUTH EVERYDAY AT BEDTIME  ? Eszopiclone 3 MG TABS Take 1 tablet (3 mg total) by mouth at bedtime. Take  immediately before bedtime  ? ezetimibe (ZETIA) 10 MG tablet TAKE ONE TABLET BY MOUTH EVERY MORNING  ? Fluticasone-Umeclidin-Vilant (TRELEGY ELLIPTA) 100-62.5-25 MCG/ACT AEPB Inhale 1 puff into the lungs daily.  ? gabapentin (NEURONTIN) 400 MG capsule Take 1 capsule (400 mg total) by mouth 2 (two) times daily.  ? leflunomide (ARAVA) 20 MG tablet Take 20 mg by mouth at bedtime.  ? LINZESS 145 MCG CAPS capsule Take 145 mcg by mouth daily before breakfast.  ? meloxicam (MOBIC) 15 MG tablet Take 15 mg by mouth at bedtime.  ? Multiple Vitamin (MULTIVITAMIN WITH MINERALS) TABS tablet Take 1 tablet by mouth daily.  ? Nutritional Supplements (ESTROVEN ENERGY PO) Take 1 tablet by mouth daily.   ? omeprazole (PRILOSEC) 40 MG capsule Take 1 capsule (40 mg total) by mouth 2 (two) times daily.  ? oxyCODONE-acetaminophen (PERCOCET) 5-325 MG tablet Take 1 tablet by mouth every 8 (eight) hours as needed for severe pain. Must last 30 days.  ? [START ON 11/20/2021] oxyCODONE-acetaminophen (PERCOCET) 5-325 MG tablet Take 1 tablet by mouth every 8 (eight) hours as needed for severe pain. Must last 30 days.  ? [START ON 12/20/2021] oxyCODONE-acetaminophen (PERCOCET) 5-325 MG tablet Take 1 tablet by mouth every 8 (eight) hours as needed for severe pain. Must last 30 days.  ? potassium chloride SA (KLOR-CON) 20 MEQ tablet Take 1 tablet (20 mEq total) by mouth daily with breakfast.  ? Spacer/Aero-Holding  Chambers (OPTICHAMBER DIAMOND) MISC To assist PRN albuterol  ? sulfamethoxazole-trimethoprim (BACTRIM DS) 800-160 MG tablet Take 2 tablets by mouth 2 (two) times daily.  ? topiramate (TOPAMAX) 25 MG tablet T

## 2021-11-17 NOTE — Assessment & Plan Note (Signed)
Chronic, stable ?Has appt with psychiatry in early 12/2021 to discuss other medication options ?Previously was on Lunesta and it has stopped working to assist with sleep ?Continue to promote sleep hygiene practices  ?

## 2021-11-17 NOTE — Assessment & Plan Note (Signed)
Chronic, stable ?Denies CP ?Chronic SOB and DOE- will refer to pulm to assist ?Denies low blood pressure/hypotension ?Denies vision changes ?No LE Edema noted on exam ?Continue medication, Coreg 12.5 BID ?Denies side effects ?Refills stable; also f/b card- has appt at end of week ?Seek emergent care if you develop chest pain or chest pressure ? ?

## 2021-11-17 NOTE — Assessment & Plan Note (Signed)
Chronic, stable ?On zetia 10 mg ?On lipitor 40 mg ?Has stopped smoking ?Continue to encoruage exercise ?BP is controlled with use of coreg 12.5 mg BID ?

## 2021-11-17 NOTE — Assessment & Plan Note (Signed)
Chronic, recommend referral to pulm to assist with best treatment plan ?Patient uses prn albuterol daily with use of trelegy 100/62.5/25 and use of ipatropium solution ?Will add singular to assist ?Slight wheeze heard following movement ?Continues to report generalized sinus complaints ? ?

## 2021-11-17 NOTE — Assessment & Plan Note (Signed)
Chronic, stable ?Use of Linzess 145 mcg daily to assist ?Continue to recommend physical activity to assist- goal of 150 mins/week ?

## 2021-11-17 NOTE — Patient Instructions (Signed)
The CDC recommends use of vaccination against shingles in adults >66 years old. Please consider this vaccination. ?

## 2021-11-17 NOTE — Assessment & Plan Note (Signed)
UTD on dental °UTD on vision °Things to do to keep yourself healthy  °- Exercise at least 30-45 minutes a day, 3-4 days a week.  °- Eat a low-fat diet with lots of fruits and vegetables, up to 7-9 servings per day.  °- Seatbelts can save your life. Wear them always.  °- Smoke detectors on every level of your home, check batteries every year.  °- Eye Doctor - have an eye exam every 1-2 years  °- Safe sex - if you may be exposed to STDs, use a condom.  °- Alcohol -  If you drink, do it moderately, less than 2 drinks per day.  °- Health Care Power of Attorney. Choose someone to speak for you if you are not able.  °- Depression is common in our stressful world.If you're feeling down or losing interest in things you normally enjoy, please come in for a visit.  °- Violence - If anyone is threatening or hurting you, please call immediately. ° ° °

## 2021-11-18 ENCOUNTER — Telehealth: Payer: Self-pay

## 2021-11-18 LAB — CBC WITH DIFFERENTIAL/PLATELET
Basophils Absolute: 0 10*3/uL (ref 0.0–0.2)
Basos: 1 %
EOS (ABSOLUTE): 0.2 10*3/uL (ref 0.0–0.4)
Eos: 3 %
Hematocrit: 33.3 % — ABNORMAL LOW (ref 34.0–46.6)
Hemoglobin: 10.9 g/dL — ABNORMAL LOW (ref 11.1–15.9)
Immature Grans (Abs): 0 10*3/uL (ref 0.0–0.1)
Immature Granulocytes: 0 %
Lymphocytes Absolute: 1.4 10*3/uL (ref 0.7–3.1)
Lymphs: 24 %
MCH: 30.9 pg (ref 26.6–33.0)
MCHC: 32.7 g/dL (ref 31.5–35.7)
MCV: 94 fL (ref 79–97)
Monocytes Absolute: 0.6 10*3/uL (ref 0.1–0.9)
Monocytes: 11 %
Neutrophils Absolute: 3.6 10*3/uL (ref 1.4–7.0)
Neutrophils: 61 %
Platelets: 309 10*3/uL (ref 150–450)
RBC: 3.53 x10E6/uL — ABNORMAL LOW (ref 3.77–5.28)
RDW: 12.2 % (ref 11.7–15.4)
WBC: 5.9 10*3/uL (ref 3.4–10.8)

## 2021-11-18 LAB — COMPREHENSIVE METABOLIC PANEL
ALT: 18 IU/L (ref 0–32)
AST: 21 IU/L (ref 0–40)
Albumin/Globulin Ratio: 1.8 (ref 1.2–2.2)
Albumin: 4.2 g/dL (ref 3.8–4.8)
Alkaline Phosphatase: 173 IU/L — ABNORMAL HIGH (ref 44–121)
BUN/Creatinine Ratio: 5 — ABNORMAL LOW (ref 12–28)
BUN: 5 mg/dL — ABNORMAL LOW (ref 8–27)
Bilirubin Total: <0.2 mg/dL (ref 0.0–1.2)
CO2: 20 mmol/L (ref 20–29)
Calcium: 9.4 mg/dL (ref 8.7–10.3)
Chloride: 109 mmol/L — ABNORMAL HIGH (ref 96–106)
Creatinine, Ser: 0.99 mg/dL (ref 0.57–1.00)
Globulin, Total: 2.4 g/dL (ref 1.5–4.5)
Glucose: 96 mg/dL (ref 70–99)
Potassium: 4.7 mmol/L (ref 3.5–5.2)
Sodium: 142 mmol/L (ref 134–144)
Total Protein: 6.6 g/dL (ref 6.0–8.5)
eGFR: 63 mL/min/{1.73_m2} (ref >59)

## 2021-11-18 LAB — LIPID PANEL
Chol/HDL Ratio: 3.8 ratio (ref 0.0–4.4)
Cholesterol, Total: 153 mg/dL (ref 100–199)
HDL: 40 mg/dL (ref >39)
LDL Chol Calc (NIH): 79 mg/dL (ref 0–99)
Triglycerides: 202 mg/dL — ABNORMAL HIGH (ref 0–149)
VLDL Cholesterol Cal: 34 mg/dL (ref 5–40)

## 2021-11-18 LAB — TSH: TSH: 2.17 u[IU]/mL (ref 0.450–4.500)

## 2021-11-18 LAB — T4, FREE: Free T4: 0.82 ng/dL (ref 0.82–1.77)

## 2021-11-18 NOTE — Progress Notes (Signed)
? ? ?Chronic Care Management ?Pharmacy Assistant  ? ?Name: Gabrielle Pineda  MRN: [id number] DOB: 1955-08-25 ? ?Reason for Encounter: Medication Review/Medication Coordination Call for [facility] ?  ?Recent office visits:  ?11/17/2021 Tally Joe, FNP (PCP Office Visit) for Annual Exam- Started: Montelukast Sodium 10 mg daily, Lab orders placed, Referral to Pulmonology, No follow-up noted ? ?11/03/2021 Kirke Shaggy, LPN (PCP Clinical Support) for Encounter for Medicare Wellness Exam- No medication changes noted, No orders placed,  ? ?Recent consult visits:  ?10/24/2021 Braxton Feathers, CPP ([place]) for Pharmacogenetics Consult- No medication changes noted ? ?10/21/2021 [name], MD (Pain Management) for Chronic Pain Syndrome- Changed: Gabapentin 400 mg from 3 times daily to twice daily, lab order placed, patient instructed to follow-up in 3 months ? ?10/17/2021 Daylene Katayama, DPM (Podiatry) for Nail Problem- Changed: Leflunomide 10 mg daily to 20 mg daily, No orders placed, patient to return in 3 months ? ?Hospital visits:  ?None in previous 6 months ? ?Medications: ?Outpatient Encounter Medications as of 11/18/2021  ?Medication Sig  ? acyclovir ointment (ZOVIRAX) 5 % SMARTSIG:Topical Every 3 Hours  ? albuterol (VENTOLIN HFA) 108 (90 Base) MCG/ACT inhaler INHALE TWO PUFFS BY MOUTH INTO LUNGS every SIX hours AS NEEDED FOR WHEEZING AND/OR SHORTNESS OF BREATH  ? ARIPiprazole (ABILIFY) 10 MG tablet Take 1 tablet (10 mg total) by mouth daily.  ? atorvastatin (LIPITOR) 40 MG tablet TAKE ONE TABLET BY MOUTH EVERY MORNING  ? busPIRone (BUSPAR) 30 MG tablet Take 1 tablet (30 mg total) by mouth 3 (three) times daily.  ? carvedilol (COREG) 12.5 MG tablet TAKE ONE TABLET BY MOUTH EVERY MORNING and TAKE ONE TABLET BY MOUTH EVERYDAY AT BEDTIME  ? Eszopiclone 3 MG TABS Take 1 tablet (3 mg total) by mouth at bedtime. Take immediately before bedtime  ? ezetimibe (ZETIA) 10 MG tablet TAKE ONE TABLET BY MOUTH EVERY  MORNING  ? Fluticasone-Umeclidin-Vilant (TRELEGY ELLIPTA) 100-62.5-25 MCG/ACT AEPB Inhale 1 puff into the lungs daily.  ? gabapentin (NEURONTIN) 400 MG capsule Take 1 capsule (400 mg total) by mouth 2 (two) times daily.  ? ipratropium (ATROVENT) 0.02 % nebulizer solution Take 2.5 mLs (0.5 mg total) by nebulization 4 (four) times daily as needed for wheezing or shortness of breath.  ? leflunomide (ARAVA) 20 MG tablet Take 20 mg by mouth at bedtime.  ? LINZESS 145 MCG CAPS capsule Take 145 mcg by mouth daily before breakfast.  ? meloxicam (MOBIC) 15 MG tablet Take 15 mg by mouth at bedtime.  ? montelukast (SINGULAIR) 10 MG tablet Take 1 tablet (10 mg total) by mouth at bedtime.  ? Multiple Vitamin (MULTIVITAMIN WITH MINERALS) TABS tablet Take 1 tablet by mouth daily.  ? Nutritional Supplements (ESTROVEN ENERGY PO) Take 1 tablet by mouth daily.   ? omeprazole (PRILOSEC) 40 MG capsule Take 1 capsule (40 mg total) by mouth 2 (two) times daily.  ? oxyCODONE-acetaminophen (PERCOCET) 5-325 MG tablet Take 1 tablet by mouth every 8 (eight) hours as needed for severe pain. Must last 30 days.  ? [START ON 11/20/2021] oxyCODONE-acetaminophen (PERCOCET) 5-325 MG tablet Take 1 tablet by mouth every 8 (eight) hours as needed for severe pain. Must last 30 days.  ? [START ON 12/20/2021] oxyCODONE-acetaminophen (PERCOCET) 5-325 MG tablet Take 1 tablet by mouth every 8 (eight) hours as needed for severe pain. Must last 30 days.  ? potassium chloride SA (KLOR-CON) 20 MEQ tablet Take 1 tablet (20 mEq total) by mouth daily with breakfast.  ? Spacer/Aero-Holding  Chambers (OPTICHAMBER DIAMOND) MISC To assist PRN albuterol  ? sulfamethoxazole-trimethoprim (BACTRIM DS) 800-160 MG tablet Take 2 tablets by mouth 2 (two) times daily.  ? topiramate (TOPAMAX) 25 MG tablet Take 1 tablet (25 mg total) by mouth 2 (two) times daily.  ? valACYclovir (VALTREX) 1000 MG tablet Take 2 tablets (2,000 mg total) by mouth See admin instructions. Take 2 g twice  daily for 5 days as needed for fever blisters  ? venlafaxine XR (EFFEXOR-XR) 75 MG 24 hr capsule Take 3 capsules (225 mg total) by mouth daily with breakfast. TAKE 3 CAPSULES BY MOUTH ONCE DAILY WITH BREAKFAST  ? vitamin B-12 (CYANOCOBALAMIN) 1000 MCG tablet Take 6,000 mcg by mouth daily.  ? vitamin E 400 UNIT capsule Take 1,600 Units by mouth daily.  ? XIIDRA 5 % SOLN Place 1 drop into both eyes daily.  ? ?No facility-administered encounter medications on file as of 11/18/2021.  ? ?Care Gaps: ?Zoster Vaccines ?COVID-19 Vaccine Booster 3 ?Mammogram ?Dexa Scan ? ?Star Rating Drugs: ?Atorvastatin 40 mg last filled on 10/23/2021 for a 30-Day supply with [facility] ? ?BP Readings from Last 3 Encounters:  ?11/17/21 135/85  ?10/21/21 (!) 151/93  ?10/08/21 135/75  ?  ?Lab Results  ?Component Value Date  ? HGBA1C 5.3 06/26/2021  ?  ?Patient obtains medications through Adherence Packaging  30 Days  ? ?Last adherence delivery included:  ?Leflunomide 20 mg tablet- Take one tablet by mouth once daily (Bedtime) ?Buspirone 30 mg tablet- Take one tablet by mouth three times daily (Breakfast, Lunch, Bedtime) ?Aripiprazole 10 mg tablet- Take one tablet by mouth daily (Breakfast) ?Ezetimibe (Zetia) 10 mg tablet Take one tablet by mouth daily (Breakfast) ?Atorvastatin 40 mg tablet- Take one tablet by mouth daily (Breakfast) ?Carvedilol 12.5 mg tablet - Take on tablet by mouth twice daily (Breakfast, Bedtime) ?Omeprazole 40 mg- Take one capsule by mouth two times a day (Breakfast, Bedtime) ?Venlafaxine ER 75 mg capsule- Take three capsules by mouth daily (Breakfast) ?Potassium 20 mEq - Take one capsule daily (Breakfast) ?Meloxicam 15 mg tablet- Take one tablet by mouth once daily (Bedtime) ?Linzess 145 mcg- Take one tablet by mouth daily (Before Breakfast) (Vials) ?Topiramate 25 mg tablet-Take one tablet by mouth twice daily (Breakfast, Bedtime) ?Trelegy 100-62.5-25 MCG- Inhale 1 puff daily ?Eszopiclone 3 mg tablet - Take one  tablet by mouth nightly (Bedtime) ?Gabapentin 400 mg tablet- Take one tablet by mouth twice times daily (Breakfast, Bedtime) ? ?Patient declined medications last month: ?Albuterol 90 mcg 2 puffs every 6 hours prn - PRN Medication patient has ample supply at this time ? ?Patient is due for next adherence delivery on: 11/27/2021 (Thursday) 2nd Route after 3 pm. ? ?Called patient and reviewed medications and coordinated delivery. ? ?This delivery to include: ?Leflunomide 20 mg tablet- Take one tablet by mouth once daily (Bedtime) ?Buspirone 30 mg tablet- Take one tablet by mouth three times daily (Breakfast, Lunch, Bedtime) ?Aripiprazole 10 mg tablet- Take one tablet by mouth daily (Breakfast) ?Ezetimibe (Zetia) 10 mg tablet Take one tablet by mouth daily (Breakfast) ?Atorvastatin 40 mg tablet- Take one tablet by mouth daily (Breakfast) ?Carvedilol 12.5 mg tablet - Take on tablet by mouth twice daily (Breakfast, Bedtime) ?Omeprazole 40 mg- Take one capsule by mouth two times a day (Breakfast, Bedtime) ?Venlafaxine ER 75 mg capsule- Take three capsules by mouth daily (Breakfast) ?Potassium 20 mEq - Take one capsule daily (Breakfast) ?Meloxicam 15 mg tablet- Take one tablet by mouth once daily (Bedtime) ?Linzess 145 mcg- Take one  tablet by mouth daily (Before Breakfast) (Vials) ?Topiramate 25 mg tablet-Take one tablet by mouth twice daily (Breakfast, Bedtime) ?Trelegy 100-62.5-25 MCG- Inhale 1 puff daily ?Eszopiclone 3 mg tablet - Take one tablet by mouth nightly (Bedtime) ?Gabapentin 400 mg tablet- Take one tablet by mouth twice daily (Breakfast, Bedtime) ? ?Patient declined the following medications: ?Albuterol 90 mcg 2 puffs every 6 hours prn - PRN Medication patient has ample supply at this time ? ?Patient needs refills for: ?No refills need authorization this month ? ?Confirmed delivery date of 04/13/202, advised patient that pharmacy will contact them the morning of delivery. ? ?Patient has a telephone appointment  with Junius Argyle, CPP on 01/19/2022 @ 1100 ? ?04/04 Left HIPAA compliant VM requesting the patient to return my call ? ?Lynann Bologna, CPA/CMA ?Clinical Pharmacist Assistant ?Phone: [phone] ' ?

## 2021-11-20 NOTE — Progress Notes (Signed)
Cardiology Office Note ? ?Date:  11/21/2021  ? ?ID:  Gabrielle Pineda, DOB [birth year], MRN [id number] ? ?PCP:  Gwyneth Sprout, FNP  ? ?Chief Complaint  ?Patient presents with  ? 12 month follow up ]  ?  Patient c/o shortness of breath with walking. Medications reviewed by the patient verbally.   ? ? ?HPI:  ?Gabrielle Pineda is a very pleasant 66 yo old woman with a  ?long history of smoking since age 47, COPD ?hyperlipidemia,  ?history of tachycardia/possible SVT  at times requiring adenosine? (details not available), and chest pain  ?chronic neck and shoulder pain. Prior surgeries. ? rheumatoid arthritis. ?COPD ?who presents for routine follow up of her tachycardia.  ? ?Last seen in clinic by myself May 2021 ? ?Quit smoking 1 yr ago, prior to that was smoking 1 pack/day ?Reports that she was in the hospital for shortness of breath in [place], at that time quit smoking ?Since she has quit smoking, gained "50 pounds" ?No regular exercise program ? ?Discussed shortness of breath. "  I have COPD" ?Reports that she uses inhalers ?Does not feel there has been an acute change to her breathing ?Denies significant chest pain concerning for angina ?Denies any paroxysmal tachycardia symptoms ? ?No prior cardiac studies eating echocardiogram and stress test,  ? ?EKG personally reviewed by myself on todays visit ?Normal sinus rhythm rate 72 bpm no significant ST-T wave changes ? ?Other past medical history reviewed ?Previous L3-S1 decompressive lumbar laminectomy.  ? ?History of chronic neck and shoulder pain.  ? ?2019: numerous traumatic accidents ?Fall July 2019, broke ribs, wrist ?Wrist healed "bent" ? ?Previously Tailbone broke, severe pain ?Neck cervical fusion, "did not take" ?Going to pain clinic, ?Has shots ? ? ?PMH:   has a past medical history of Alcohol abuse, Anemia, Anxiety, Arthritis, Cervicalgia, Cirrhosis ([place]) (1994), COPD (chronic obstructive pulmonary disease) ([place]), Depression, Difficult intubation, Dyspnea,  GERD (gastroesophageal reflux disease), Headache, Heart murmur, Hypertension, Other and unspecified hyperlipidemia, Status post insertion of spinal cord stimulator, and Tachycardia. ? ?PSH:    ?Past Surgical History:  ?Procedure Laterality Date  ? ANTERIOR CERVICAL DECOMP/DISCECTOMY FUSION  2012, 2015, 2018  ? x3  ? AUGMENTATION MAMMAPLASTY Bilateral 1982  ? [place]  ? CARPAL TUNNEL RELEASE  11/11/2011  ? Procedure: CARPAL TUNNEL RELEASE;  Surgeon: Floyce Stakes, MD;  Location: [facility];  Service: Neurosurgery;  Laterality: Right;  Right Median Nerve Decompression  ? CARPAL TUNNEL RELEASE  2013  ? COLONOSCOPY WITH PROPOFOL N/A 11/01/2017  ? Procedure: COLONOSCOPY WITH PROPOFOL;  Surgeon: Manya Silvas, MD;  Location: [facility];  Service: Endoscopy;  Laterality: N/A;  ? ESOPHAGOGASTRODUODENOSCOPY    ? LUMBAR LAMINECTOMY/DECOMPRESSION MICRODISCECTOMY N/A 08/15/2019  ? Procedure: Lumbar three to Sacral one Decompressive lumbar laminectomy;  Surgeon: Erline Levine, MD;  Location: [facility];  Service: Neurosurgery;  Laterality: N/A;  ? neck disc surgery    ? plates and screws in neck x 2  ? ROTATOR CUFF REPAIR  06/16/2016  ? right shoulder   ? ROTATOR CUFF REPAIR    ? SPINAL CORD STIMULATOR INSERTION N/A 03/17/2021  ? Procedure: THORACIC SPINAL CORD STIMULATOR (PERCUTANEOUS) & PULSE GENERATOR PLACEMENT;  Surgeon: [name], MD;  Location: [facility] ORS;  Service: Neurosurgery;  Laterality: N/A;  ? [place]   ? TOTAL HIP ARTHROPLASTY Right 04/15/2021  ? Procedure: TOTAL HIP ARTHROPLASTY ANTERIOR APPROACH;  Surgeon: Hessie Knows, MD;  Location: [facility] ORS;  Service:  Orthopedics;  Laterality: Right;  ? ? ?Current Outpatient Medications  ?Medication Sig Dispense Refill  ? acyclovir ointment (ZOVIRAX) 5 % SMARTSIG:Topical Every 3 Hours    ? albuterol (VENTOLIN HFA) 108 (90 Base) MCG/ACT inhaler INHALE TWO PUFFS BY MOUTH INTO LUNGS every SIX hours AS NEEDED FOR WHEEZING  AND/OR SHORTNESS OF BREATH 8.5 g 2  ? ARIPiprazole (ABILIFY) 10 MG tablet Take 1 tablet (10 mg total) by mouth daily. 90 tablet 1  ? atorvastatin (LIPITOR) 40 MG tablet TAKE ONE TABLET BY MOUTH EVERY MORNING 90 tablet 0  ? busPIRone (BUSPAR) 30 MG tablet Take 1 tablet (30 mg total) by mouth 3 (three) times daily. 270 tablet 1  ? carvedilol (COREG) 12.5 MG tablet TAKE ONE TABLET BY MOUTH EVERY MORNING and TAKE ONE TABLET BY MOUTH EVERYDAY AT BEDTIME 180 tablet 0  ? Eszopiclone 3 MG TABS Take 1 tablet (3 mg total) by mouth at bedtime. Take immediately before bedtime 180 tablet 0  ? ezetimibe (ZETIA) 10 MG tablet TAKE ONE TABLET BY MOUTH EVERY MORNING 90 tablet 0  ? Fluticasone-Umeclidin-Vilant (TRELEGY ELLIPTA) 100-62.5-25 MCG/ACT AEPB Inhale 1 puff into the lungs daily. 60 each 11  ? gabapentin (NEURONTIN) 400 MG capsule Take 1 capsule (400 mg total) by mouth 2 (two) times daily. 60 capsule 5  ? ipratropium (ATROVENT) 0.02 % nebulizer solution Take 2.5 mLs (0.5 mg total) by nebulization 4 (four) times daily as needed for wheezing or shortness of breath. 90 mL 1  ? leflunomide (ARAVA) 20 MG tablet Take 20 mg by mouth at bedtime.    ? LINZESS 145 MCG CAPS capsule Take 145 mcg by mouth daily before breakfast.    ? meloxicam (MOBIC) 15 MG tablet Take 15 mg by mouth at bedtime.    ? montelukast (SINGULAIR) 10 MG tablet Take 1 tablet (10 mg total) by mouth at bedtime. 30 tablet 11  ? Multiple Vitamin (MULTIVITAMIN WITH MINERALS) TABS tablet Take 1 tablet by mouth daily.    ? Nutritional Supplements (ESTROVEN ENERGY PO) Take 1 tablet by mouth daily.     ? omeprazole (PRILOSEC) 40 MG capsule Take 1 capsule (40 mg total) by mouth 2 (two) times daily. 180 capsule 1  ? oxyCODONE-acetaminophen (PERCOCET) 5-325 MG tablet Take 1 tablet by mouth every 8 (eight) hours as needed for severe pain. Must last 30 days. 90 tablet 0  ? potassium chloride SA (KLOR-CON) 20 MEQ tablet Take 1 tablet (20 mEq total) by mouth daily with breakfast.  90 tablet 1  ? Spacer/Aero-Holding Chambers (OPTICHAMBER DIAMOND) MISC To assist PRN albuterol 1 each 0  ? sulfamethoxazole-trimethoprim (BACTRIM DS) 800-160 MG tablet Take 2 tablets by mouth 2 (two) times daily. 56 tablet 0  ? topiramate (TOPAMAX) 25 MG tablet Take 1 tablet (25 mg total) by mouth 2 (two) times daily. 60 tablet 11  ? valACYclovir (VALTREX) 1000 MG tablet Take 2 tablets (2,000 mg total) by mouth See admin instructions. Take 2 g twice daily for 5 days as needed for fever blisters 30 tablet 0  ? venlafaxine XR (EFFEXOR-XR) 75 MG 24 hr capsule Take 3 capsules (225 mg total) by mouth daily with breakfast. TAKE 3 CAPSULES BY MOUTH ONCE DAILY WITH BREAKFAST 270 capsule 1  ? vitamin B-12 (CYANOCOBALAMIN) 1000 MCG tablet Take 6,000 mcg by mouth daily.    ? vitamin E 400 UNIT capsule Take 1,600 Units by mouth daily.    ? XIIDRA 5 % SOLN Place 1 drop into both eyes daily.    ? [  START ON 12/20/2021] oxyCODONE-acetaminophen (PERCOCET) 5-325 MG tablet Take 1 tablet by mouth every 8 (eight) hours as needed for severe pain. Must last 30 days. (Patient not taking: Reported on 11/21/2021) 90 tablet 0  ? ?No current facility-administered medications for this visit.  ? ? ?Allergies:   Plaquenil [hydroxychloroquine]  ? ?Social History:  The patient  reports that she quit smoking about 12 months ago. Her smoking use included cigarettes. She has a 40.00 pack-year smoking history. She has never used smokeless tobacco. She reports that she does not drink alcohol and does not use drugs.  ? ?Family History:   family history includes Alcohol abuse in her father and mother; Bipolar disorder in her mother; Pancreatic cancer in her father; Suicidality in her mother.  ? ? ?Review of Systems: ?Review of Systems  ?Constitutional: Negative.   ?HENT: Negative.    ?Respiratory: Negative.    ?Cardiovascular: Negative.   ?Gastrointestinal: Negative.   ?Musculoskeletal:  Positive for back pain and joint pain.  ?Neurological: Negative.    ?Psychiatric/Behavioral: Negative.    ?All other systems reviewed and are negative. ? ? ?PHYSICAL EXAM: ?VS:  BP 110/70 (BP Location: Left Arm, Patient Position: Sitting, Cuff Size: Normal)   Pulse 72   Ht

## 2021-11-21 ENCOUNTER — Ambulatory Visit: Payer: Medicare Other | Admitting: Cardiovascular Disease

## 2021-11-21 ENCOUNTER — Encounter: Payer: Self-pay | Admitting: Cardiovascular Disease

## 2021-11-21 VITALS — BP 110/70 | HR 72 | Ht 69.0 in | Wt 177.5 lb

## 2021-11-21 DIAGNOSIS — Z720 Tobacco use: Secondary | ICD-10-CM

## 2021-11-21 DIAGNOSIS — I471 Supraventricular tachycardia: Secondary | ICD-10-CM

## 2021-11-21 DIAGNOSIS — J449 Chronic obstructive pulmonary disease, unspecified: Secondary | ICD-10-CM

## 2021-11-21 DIAGNOSIS — E782 Mixed hyperlipidemia: Secondary | ICD-10-CM

## 2021-11-21 DIAGNOSIS — R002 Palpitations: Secondary | ICD-10-CM | POA: Diagnosis not present

## 2021-11-21 DIAGNOSIS — I10 Essential (primary) hypertension: Secondary | ICD-10-CM

## 2021-11-21 MED ORDER — EZETIMIBE 10 MG PO TABS: 10.0000 mg | ORAL_TABLET | Freq: Every morning | ORAL | 3 refills | Status: DC

## 2021-11-21 MED ORDER — CARVEDILOL 12.5 MG PO TABS: ORAL_TABLET | ORAL | 3 refills | Status: DC

## 2021-11-21 MED ORDER — ATORVASTATIN CALCIUM 40 MG PO TABS: 40.0000 mg | ORAL_TABLET | Freq: Every morning | ORAL | 3 refills | Status: DC

## 2021-11-21 NOTE — Patient Instructions (Signed)
Medication Instructions:  No changes  If you need a refill on your cardiac medications before your next appointment, please call your pharmacy.   Lab work: No new labs needed  Testing/Procedures: No new testing needed  Follow-Up: At [facility], you and your health needs are our priority.  As part of our continuing mission to provide you with exceptional heart care, we have created designated Provider Care Teams.  These Care Teams include your primary Cardiologist (physician) and Advanced Practice Providers (APPs -  Physician Assistants and Nurse Practitioners) who all work together to provide you with the care you need, when you need it.  You will need a follow up appointment in 12 months  Providers on your designated Care Team:   Christopher Berge, NP Ryan Dunn, PA-C Cadence Furth, PA-C  COVID-19 Vaccine Information can be found at: [email or web address] For questions related to vaccine distribution or appointments, please email [email or web address] or call [phone].   

## 2021-12-09 ENCOUNTER — Encounter: Payer: Self-pay | Admitting: Family Medicine

## 2021-12-09 ENCOUNTER — Ambulatory Visit (INDEPENDENT_AMBULATORY_CARE_PROVIDER_SITE_OTHER): Payer: Medicare Other | Admitting: Family Medicine

## 2021-12-09 ENCOUNTER — Ambulatory Visit: Payer: Self-pay

## 2021-12-09 VITALS — BP 137/83 | HR 77 | Temp 97.7°F | Resp 18 | Wt 178.0 lb

## 2021-12-09 DIAGNOSIS — J418 Mixed simple and mucopurulent chronic bronchitis: Secondary | ICD-10-CM

## 2021-12-09 MED ORDER — AZITHROMYCIN 250 MG PO TABS: ORAL_TABLET | ORAL | 0 refills | Status: AC

## 2021-12-09 MED ORDER — PREDNISONE 20 MG PO TABS: 40.0000 mg | ORAL_TABLET | Freq: Every day | ORAL | 0 refills | Status: AC

## 2021-12-09 NOTE — Progress Notes (Signed)
? ?  SUBJECTIVE:  ? ?CHIEF COMPLAINT / HPI:  ? ?UPPER RESPIRATORY TRACT INFECTION ?- symptom onset yesterday ?- h/o COPD on trelegy, singulair. Referred to Pulm at last visit.  ?- has had to use albuterol once daily. Using ipratropium neb once daily.  ?- has received PCV20, 2 doses of mRNA COVID vaccine ?- COVID negative yesterday. ?- husband also with similar symptoms ? ?Fever:  hasn't checked ?Cough: yes, productive of greenish-brown sputum ?Shortness of breath: yes ?Wheezing: yes ?Chest pain: no ?Chest tightness: no ?Chest congestion: yes ?Nasal congestion: yes ?Runny nose: yes ?Sore throat: no ?Vomiting: no ?Sick contacts: yes ?Treatments attempted: mucinex, alka seltzer plus max, albuterol.  ? ? ?OBJECTIVE:  ? ?BP 137/83 (BP Location: Left Arm, Patient Position: Sitting, Cuff Size: Normal)   Pulse 77   Temp 97.7 ?F (36.5 ?C) (Temporal)   Resp 18   Wt 178 lb (80.7 kg)   SpO2 97%   BMI 26.29 kg/m?   ?Gen: well appearing, in NAD ?Card: RRR ?Lungs: expiratory wheezing throughout. Speaks in few word sentences. Appropriately saturated on RA at rest.  ?Ext: WWP, no edema ? ? ?ASSESSMENT/PLAN:  ? ?COPD (chronic obstructive pulmonary disease) ?With acute exacerbation from URI. COVID negative. Able to maintain sats at rest and with ambulation. Will provide steroid burst and Zpack. Already on triple therapy for maintenance, maintain appt with Pulm. Emergency precautions discussed.  ?  ? ? ?Myles Gip, DO ?

## 2021-12-09 NOTE — Assessment & Plan Note (Signed)
With acute exacerbation from URI. COVID negative. Able to maintain sats at rest and with ambulation. Will provide steroid burst and Zpack. Already on triple therapy for maintenance, maintain appt with Pulm. Emergency precautions discussed.  ?

## 2021-12-09 NOTE — Telephone Encounter (Signed)
? ? ? ?  Chief Complaint: head cold - difficulty breathing ?Symptoms: ibid ?Frequency: today ?Pertinent Negatives: Patient denies Fever ?Disposition: '[]'$ ED /'[]'$ Urgent Care (no appt availability in office) / '[x]'$ Appointment(In office/virtual)/ '[]'$  [facility]/ '[]'$ Home Care/ '[]'$ Refused Recommended Disposition /'[]'$ [facility]/ '[]'$  Follow-up with PCP ?Additional Notes: Pt started with a cold today. Pt has COPD and is having some heavy breathing. Pt would like antibiotics before this infection becoes worse. ?Reason for Disposition ? Common cold with no complications ? ?Answer Assessment - Initial Assessment Questions ?1. ONSET: "When did the nasal discharge start?"  ?    today ?2. AMOUNT: "How much discharge is there?"  ?    some ?3. COUGH: "Do you have a cough?" If yes, ask: "Describe the color of your sputum" (clear, white, yellow, green) ?    yes ?4. RESPIRATORY DISTRESS: "Describe your breathing."  ?    SOB - wheezy ?5. FEVER: "Do you have a fever?" If Yes, ask: "What is your temperature, how was it measured, and when did it start?" ?    unknown ?6. SEVERITY: "Overall, how bad are you feeling right now?" (e.g., doesn't interfere with normal activities, staying home from school/work, staying in bed)  ?    ok ?7. OTHER SYMPTOMS: "Do you have any other symptoms?" (e.g., sore throat, earache, wheezing, vomiting) ?     ?8. PREGNANCY: "Is there any chance you are pregnant?" "When was your last menstrual period?" ?    na ? ?Protocols used: Common Cold-A-AH ? ?

## 2021-12-11 ENCOUNTER — Ambulatory Visit: Payer: Medicare Other | Admitting: Pulmonary Disease

## 2021-12-11 ENCOUNTER — Encounter: Payer: Self-pay | Admitting: Pulmonary Disease

## 2021-12-11 VITALS — BP 128/76 | HR 93 | Temp 98.1°F | Ht 69.0 in | Wt 176.4 lb

## 2021-12-11 DIAGNOSIS — J449 Chronic obstructive pulmonary disease, unspecified: Secondary | ICD-10-CM | POA: Diagnosis not present

## 2021-12-11 DIAGNOSIS — R0602 Shortness of breath: Secondary | ICD-10-CM

## 2021-12-11 DIAGNOSIS — J849 Interstitial pulmonary disease, unspecified: Secondary | ICD-10-CM

## 2021-12-11 MED ORDER — BREZTRI AEROSPHERE 160-9-4.8 MCG/ACT IN AERO: 2.0000 | INHALATION_SPRAY | Freq: Two times a day (BID) | RESPIRATORY_TRACT | 11 refills | Status: DC

## 2021-12-11 MED ORDER — BREZTRI AEROSPHERE 160-9-4.8 MCG/ACT IN AERO: 2.0000 | INHALATION_SPRAY | Freq: Two times a day (BID) | RESPIRATORY_TRACT | 0 refills | Status: DC

## 2021-12-11 NOTE — Progress Notes (Signed)
Subjective:    Patient ID: Gabrielle Pineda, female    DOB: [birth year], 66 y.o.   MRN: [id number] Patient Care Team: Jacky Kindle, FNP as PCP - General (Family Medicine) Antonieta Iba, MD as PCP - Cardiology (Cardiology) [name], MD (Inactive) (Gastroenterology) Gaspar Cola, [facility] (Pharmacist)  Chief Complaint  Patient presents with   pulmonary consult    Hx of COPD- currently taking zpak and prednisone. C/o prod cough with yellow to green sputum, sob with exertion and wheezing    HPI This is a 66 year old female, former smoker, quit in 2022 with prior history of heavy smoking presents for relation of shortness of breath,COPD and abnormal CT chest with lung nodularity.past medical history significant for rheumatoid arthritis maintained on Arava, sulfasalazine (previously on methotrexate), limited by chronic neck back and joint pain on chronic pain medicines and is disabled.  The patient is kindly referred by Merita Norton, FNP, her primary care practitioner is [name], PA-C.  The patient states she was diagnosed with COPD 10 years ago.  For the last 2 years she has noted an increase dyspnea on exertion that now has become pretty much a constant symptom.  She does not note anything that really improves her.  She has been on Trelegy Ellipta which she states helps her some but not long-lasting.  She also uses albuterol which does help her when she does use it.  Approximately a year ago her dyspnea became so distressing to her that she quit smoking.  She has had issues with rheumatoid arthritis as noted above.  Most recent chest x-rays showed some bronchial thickening and nodularity.  She has not had any chest pain.  No lower extremity edema nor calf tenderness.  Recently over the last several days developed cough productive of yellowish to green sputum and is currently on a taper of prednisone and azithromycin.  The symptoms have improved somewhat.  He has not had any  hemoptysis.  Throughout these acute symptoms she has not had any fevers, chills or sweats.  Not endorse any other symptomatology.  Does not have significant occupational exposure in the past.  She is retired.  She resides with her husband in [place], [place].  Review of Systems A 10 point review of systems was performed and it is as noted above otherwise negative.  Past Medical History:  Diagnosis Date   Alcohol abuse    Anemia    Anxiety    Arthritis    Cervicalgia    Cirrhosis (HCC) 1994   COPD (chronic obstructive pulmonary disease) (HCC)    Depression    Difficult intubation    has plates and screws in neck   Dyspnea    GERD (gastroesophageal reflux disease)    Headache    Heart murmur    on heard when pt is lying   Hypertension    Other and unspecified hyperlipidemia    Status post insertion of spinal cord stimulator    Tachycardia    d/t questionable anxiety happens every 5- 10 years, sees [facility]   Past Surgical History:  Procedure Laterality Date   ANTERIOR CERVICAL DECOMP/DISCECTOMY FUSION  2012, 2015, 2018   x3   AUGMENTATION MAMMAPLASTY Bilateral 1982   BREAST ENHANCEMENT SURGERY  1981   CARPAL TUNNEL RELEASE  11/11/2011   Procedure: CARPAL TUNNEL RELEASE;  Surgeon: [name], MD;  Location: [facility] ORS;  Service: Neurosurgery;  Laterality: Right;  Right Median Nerve Decompression   CARPAL  TUNNEL RELEASE  2013   COLONOSCOPY WITH PROPOFOL N/A 11/01/2017   Procedure: COLONOSCOPY WITH PROPOFOL;  Surgeon: [name], MD;  Location: [facility] ENDOSCOPY;  Service: Endoscopy;  Laterality: N/A;   ESOPHAGOGASTRODUODENOSCOPY     LUMBAR LAMINECTOMY/DECOMPRESSION MICRODISCECTOMY N/A 08/15/2019   Procedure: Lumbar three to Sacral one Decompressive lumbar laminectomy;  Surgeon: [name], MD;  [facility];  Service: Neurosurgery;  Laterality: N/A;   neck disc surgery     plates and screws in neck x 2   ROTATOR CUFF REPAIR  06/16/2016    right shoulder    ROTATOR CUFF REPAIR     SPINAL CORD STIMULATOR INSERTION N/A 03/17/2021   Procedure: THORACIC SPINAL CORD STIMULATOR (PERCUTANEOUS) & PULSE GENERATOR PLACEMENT;  Surgeon: [name], MD;  Location: [facility] ORS;  Service: Neurosurgery;  Laterality: N/A;   TONSILLECTOMY AND ADENOIDECTOMY  1973    TOTAL HIP ARTHROPLASTY Right 04/15/2021   Procedure: TOTAL HIP ARTHROPLASTY ANTERIOR APPROACH;  Surgeon: [name], MD;  Location: [facility] ORS;  Service: Orthopedics;  Laterality: Right;   Patient Active Problem List   Diagnosis Date Noted   Need for vaccination against Streptococcus pneumoniae 11/17/2021   Sleep disorder 10/23/2021   Iron deficiency anemia 06/26/2021   Depression, major, single episode, moderate (HCC) 06/26/2021   Status post total hip replacement, right 04/15/2021   Chronic radicular lumbar pain 10/03/2020   Postlaminectomy syndrome, lumbar region 06/03/2020   Moderate episode of recurrent major depressive disorder (HCC) 12/04/2019   Spondylosis of cervical region without myelopathy or radiculopathy 09/21/2019   DDD (degenerative disc disease), cervical 09/21/2019   Cervicalgia 09/21/2019   Cervical fusion syndrome 09/21/2019   Chronic pain syndrome 09/21/2019   Spinal stenosis, lumbar region, with neurogenic claudication 08/15/2019   History of adenomatous polyp of colon 11/04/2017   Cervical radiculopathy 08/02/2017   Neuropathy 08/02/2017   Essential hypertension 07/28/2017   Closed compression fracture of L5 lumbar vertebra 07/07/2017   Sacral insufficiency fracture with routine healing 07/07/2017   COPD (chronic obstructive pulmonary disease) (HCC) 04/10/2015   Family history of malignant neoplasm of pancreas 04/03/2015   Hypercholesteremia 04/03/2015   Disorder of iron metabolism 04/03/2015   Carpal tunnel syndrome 03/29/2015   Acid reflux 10/11/2014   Closed fracture of distal phalanx of thumb 08/15/2014   Arthritis, degenerative 01/30/2014    Arthritis or polyarthritis, rheumatoid (HCC) 01/30/2014   Paroxysmal supraventricular tachycardia (HCC) 09/22/2010   B-complex deficiency 09/09/2007   CN (constipation) 06/26/2007   Clinical depression 06/26/2007   Cold sore 06/26/2007   H/O alcohol abuse 06/26/2007   Cannot sleep 06/26/2007   Localized osteoarthrosis, hand 06/26/2007   Menopausal symptom 06/26/2007   Social History   Tobacco Use   Smoking status: Former    Packs/day: 2.00    Years: 40.00    Pack years: 80.00    Types: Cigarettes    Quit date: 10/30/2020    Years since quitting: 1.1   Smokeless tobacco: Never  Substance Use Topics   Alcohol use: No    Comment: recovering alcoholic Since 1994    Allergies  Allergen Reactions   Plaquenil [Hydroxychloroquine] Rash   Current Meds  Medication Sig   acyclovir ointment (ZOVIRAX) 5 % SMARTSIG:Topical Every 3 Hours   albuterol (VENTOLIN HFA) 108 (90 Base) MCG/ACT inhaler INHALE TWO PUFFS BY MOUTH INTO LUNGS every SIX hours AS NEEDED FOR WHEEZING AND/OR SHORTNESS OF BREATH   ARIPiprazole (ABILIFY) 10 MG tablet Take 1 tablet (10 mg total) by mouth daily.  atorvastatin (LIPITOR) 40 MG tablet Take 1 tablet (40 mg total) by mouth every morning.   azithromycin (ZITHROMAX) 250 MG tablet Take 2 tablets on day 1, then 1 tablet daily on days 2 through 5   busPIRone (BUSPAR) 30 MG tablet Take 1 tablet (30 mg total) by mouth 3 (three) times daily.   carvedilol (COREG) 12.5 MG tablet TAKE ONE TABLET BY MOUTH EVERY MORNING and TAKE ONE TABLET BY MOUTH EVERYDAY AT BEDTIME   Eszopiclone 3 MG TABS Take 1 tablet (3 mg total) by mouth at bedtime. Take immediately before bedtime   ezetimibe (ZETIA) 10 MG tablet Take 1 tablet (10 mg total) by mouth every morning.   Fluticasone-Umeclidin-Vilant (TRELEGY ELLIPTA) 100-62.5-25 MCG/ACT AEPB Inhale 1 puff into the lungs daily.   gabapentin (NEURONTIN) 400 MG capsule Take 1 capsule (400 mg total) by mouth 2 (two) times daily.   ipratropium  (ATROVENT) 0.02 % nebulizer solution Take 2.5 mLs (0.5 mg total) by nebulization 4 (four) times daily as needed for wheezing or shortness of breath.   leflunomide (ARAVA) 20 MG tablet Take 20 mg by mouth at bedtime.   LINZESS 145 MCG CAPS capsule Take 145 mcg by mouth daily before breakfast.   meloxicam (MOBIC) 15 MG tablet Take 15 mg by mouth at bedtime.   montelukast (SINGULAIR) 10 MG tablet Take 1 tablet (10 mg total) by mouth at bedtime.   Multiple Vitamin (MULTIVITAMIN WITH MINERALS) TABS tablet Take 1 tablet by mouth daily.   Nutritional Supplements (ESTROVEN ENERGY PO) Take 1 tablet by mouth daily.    omeprazole (PRILOSEC) 40 MG capsule Take 1 capsule (40 mg total) by mouth 2 (two) times daily.   oxyCODONE-acetaminophen (PERCOCET) 5-325 MG tablet Take 1 tablet by mouth every 8 (eight) hours as needed for severe pain. Must last 30 days.   [START ON 12/20/2021] oxyCODONE-acetaminophen (PERCOCET) 5-325 MG tablet Take 1 tablet by mouth every 8 (eight) hours as needed for severe pain. Must last 30 days.   potassium chloride SA (KLOR-CON) 20 MEQ tablet Take 1 tablet (20 mEq total) by mouth daily with breakfast.   predniSONE (DELTASONE) 20 MG tablet Take 2 tablets (40 mg total) by mouth daily with breakfast for 5 days.   Spacer/Aero-Holding Chambers (OPTICHAMBER DIAMOND) MISC To assist PRN albuterol   topiramate (TOPAMAX) 25 MG tablet Take 1 tablet (25 mg total) by mouth 2 (two) times daily.   valACYclovir (VALTREX) 1000 MG tablet Take 2 tablets (2,000 mg total) by mouth See admin instructions. Take 2 g twice daily for 5 days as needed for fever blisters   venlafaxine XR (EFFEXOR-XR) 75 MG 24 hr capsule Take 3 capsules (225 mg total) by mouth daily with breakfast. TAKE 3 CAPSULES BY MOUTH ONCE DAILY WITH BREAKFAST   vitamin B-12 (CYANOCOBALAMIN) 1000 MCG tablet Take 6,000 mcg by mouth daily.   vitamin E 400 UNIT capsule Take 1,600 Units by mouth daily.   XIIDRA 5 % SOLN Place 1 drop into both eyes  daily.   [DISCONTINUED] sulfamethoxazole-trimethoprim (BACTRIM DS) 800-160 MG tablet Take 2 tablets by mouth 2 (two) times daily.   Immunization History  Administered Date(s) Administered   Fluad Quad(high Dose 65+) 10/01/2021   Influenza Split 09/07/2012   Influenza,inj,Quad PF,6+ Mos 11/18/2015, 07/23/2016, 07/02/2017, 04/14/2018, 04/17/2019, 04/19/2020   Influenza-Unspecified 09/24/2014   PFIZER(Purple Top)SARS-COV-2 Vaccination 11/21/2019, 12/12/2019   PNEUMOCOCCAL CONJUGATE-20 11/17/2021   Pneumococcal Polysaccharide-23 02/12/2011, 09/24/2014, 04/19/2020   Tdap 02/12/2011, 11/15/2015       Objective:   Physical  Exam BP 128/76 (BP Location: Left Arm, Cuff Size: Large)   Pulse 93   Temp 98.1 F (36.7 C) (Temporal)   Ht 5\' 9"  (1.753 m)   Wt 176 lb 6.4 oz (80 kg)   SpO2 97%   BMI 26.05 kg/m  GENERAL: Well-developed, overweight woman, no acute distress.  Uses accessories of respiration appears to be on chronic basis.  Fully ambulatory.  No conversational dyspnea. HEAD: Normocephalic, atraumatic.  EYES: Pupils equal, round, reactive to light.  No scleral icterus.  MOUTH: Nose/mouth/throat not examined due to masking requirements for COVID 19. NECK: Supple. No thyromegaly. Trachea midline. No JVD.  No adenopathy. PULMONARY: Good air entry bilaterally.  No adventitious sounds. CARDIOVASCULAR: S1 and S2. Regular rate and rhythm.  ABDOMEN: Benign. MUSCULOSKELETAL: Stigmata of rheumatoid arthritis on both hands, no clubbing, no edema.  NEUROLOGIC: No overt focal deficit.  No gait disturbance.  Speech is fluent. SKIN: Intact,warm,dry. PSYCH: Mood anxious, behavior normal.       Assessment & Plan:     ICD-10-CM   1. SOB (shortness of breath)  R06.02 Pulmonary Function Test [facility] Only    CT Chest High Resolution   PFTs to better characterize    2. COPD suggested by initial evaluation (HCC)  J44.9    PFTs to better characterize Trial of Breztri 2 puffs twice a day Continue  as needed albuterol Discontinue Trelegy    3. ILD (interstitial lung disease) (HCC)  J84.9    High-resolution CT chest     Orders Placed This Encounter  Procedures   CT Chest High Resolution    Standing Status:   Future    Number of Occurrences:   1    Standing Expiration Date:   12/12/2022    Scheduling Instructions:     Next available.    Order Specific Question:   Preferred imaging location?    Answer:   [facility]   Pulmonary Function Test [facility] Only    Standing Status:   Future    Number of Occurrences:   1    Standing Expiration Date:   12/12/2022    Scheduling Instructions:     Next available.    Order Specific Question:   Full PFT: includes the following: basic spirometry, spirometry pre & post bronchodilator, diffusion capacity (DLCO), lung volumes    Answer:   Full PFT   Meds ordered this encounter  Medications   Budeson-Glycopyrrol-Formoterol (BREZTRI AEROSPHERE) 160-9-4.8 MCG/ACT AERO    Sig: Inhale 2 puffs into the lungs in the morning and at bedtime.    Dispense:  10.7 g    Refill:  11   Budeson-Glycopyrrol-Formoterol (BREZTRI AEROSPHERE) 160-9-4.8 MCG/ACT AERO    Sig: Inhale 2 puffs into the lungs in the morning and at bedtime.    Dispense:  5.9 g    Refill:  0    Order Specific Question:   Lot Number?    Answer:   [id number]    Order Specific Question:   Expiration Date?    Answer:   05/17/2024    Order Specific Question:   Manufacturer?    Answer:   [employer] [[id number]]    Order Specific Question:   Quantity    Answer:   1   We will discontinue Trelegy and give the patient a trial of Breztri 2 puffs twice a day.  We are obtaining studies as above.  We will see the patient in follow-up in 3 to 4 weeks time to see how these interventions are  helping the patient.  She is to contact us prior to that time should any new difficulties arise.  Gailen Shelter, MD Advanced Bronchoscopy PCCM [facility]    *This note was dictated using  voice recognition software/Dragon.  Despite best efforts to proofread, errors can occur which can change the meaning. Any transcriptional errors that result from this process are unintentional and may not be fully corrected at the time of dictation.

## 2021-12-11 NOTE — Patient Instructions (Signed)
We are going to schedule breathing tests. ? ?We are going to schedule a CT scan of the chest. ? ?We are changing your Trelegy to [facility] 2 puffs twice a day.  Please do not take the Trelegy anymore as you will be on the [facility]. ? ?You will see me or one of my nurse practitioners in 3 to 4 weeks time to see how these things are working out for you. ?

## 2021-12-14 ENCOUNTER — Encounter: Payer: Self-pay | Admitting: Family Medicine

## 2021-12-15 ENCOUNTER — Ambulatory Visit: Payer: Self-pay

## 2021-12-15 DIAGNOSIS — M0579 Rheumatoid arthritis with rheumatoid factor of multiple sites without organ or systems involvement: Secondary | ICD-10-CM | POA: Diagnosis not present

## 2021-12-15 DIAGNOSIS — M159 Polyosteoarthritis, unspecified: Secondary | ICD-10-CM | POA: Diagnosis not present

## 2021-12-15 DIAGNOSIS — Z79899 Other long term (current) drug therapy: Secondary | ICD-10-CM | POA: Diagnosis not present

## 2021-12-15 NOTE — Telephone Encounter (Signed)
?  Chief Complaint: SOB ?Symptoms: SOB, congestion, cough, hoarseness ?Frequency: 1 week ?Pertinent Negatives: NA ?Disposition: '[]'$ ED /'[]'$ Urgent Care (no appt availability in office) / '[]'$ Appointment(In office/virtual)/ '[]'$  [facility]/ '[]'$ Home Care/ '[]'$ Refused Recommended Disposition /'[]'$ [facility]/ '[x]'$  Follow-up with PCP ?Additional Notes: Pt calling in to f/up on Mychart message she sent. She has been having SOB and completed the Zpak and is requesting Augmentin to be sent in since normally that's what helps her. She didn't want to come in for a f/up visit. I advised her I would send PCP a message have someone f/up with her if the abx was sent in to pharmacy. Pt verbalized understanding.  ? ?Reason for Disposition ? [1] MILD difficulty breathing (e.g., minimal/no SOB at rest, SOB with walking, pulse <100) AND [2] NEW-onset or WORSE than normal ? ?Answer Assessment - Initial Assessment Questions ?1. RESPIRATORY STATUS: "Describe your breathing?" (e.g., wheezing, shortness of breath, unable to speak, severe coughing)  ?    SOB ?2. ONSET: "When did this breathing problem begin?"  ?    1 week  ?3. PATTERN "Does the difficult breathing come and go, or has it been constant since it started?"  ?    Comes and goes  ?4. SEVERITY: "How bad is your breathing?" (e.g., mild, moderate, severe)  ?  - MILD: No SOB at rest, mild SOB with walking, speaks normally in sentences, can lie down, no retractions, pulse < 100.  ?  - MODERATE: SOB at rest, SOB with minimal exertion and prefers to sit, cannot lie down flat, speaks in phrases, mild retractions, audible wheezing, pulse 100-120.  ?  - SEVERE: Very SOB at rest, speaks in single words, struggling to breathe, sitting hunched forward, retractions, pulse > 120  ?    Mild to moderate  ?9. OTHER SYMPTOMS: "Do you have any other symptoms? (e.g., dizziness, runny nose, cough, chest pain, fever) ?    Congestion and cough, fever ? ?Protocols used: Breathing  Difficulty-A-AH ? ?

## 2021-12-16 ENCOUNTER — Encounter: Payer: Self-pay | Admitting: Physician Assistant

## 2021-12-16 ENCOUNTER — Ambulatory Visit
Admission: RE | Admit: 2021-12-16 | Discharge: 2021-12-16 | Disposition: A | Discharge status: Home / Self Care | Payer: Medicare Other | Source: Ambulatory Visit | Attending: Physician Assistant | Admitting: Physician Assistant

## 2021-12-16 ENCOUNTER — Other Ambulatory Visit: Payer: Self-pay | Admitting: Family Medicine

## 2021-12-16 ENCOUNTER — Other Ambulatory Visit: Payer: Self-pay

## 2021-12-16 ENCOUNTER — Ambulatory Visit
Admission: RE | Admit: 2021-12-16 | Discharge: 2021-12-16 | Disposition: A | Discharge status: Home / Self Care | Payer: Medicare Other | Attending: Physician Assistant | Admitting: Physician Assistant

## 2021-12-16 ENCOUNTER — Telehealth: Payer: Self-pay | Admitting: Family Medicine

## 2021-12-16 ENCOUNTER — Ambulatory Visit (INDEPENDENT_AMBULATORY_CARE_PROVIDER_SITE_OTHER): Payer: Medicare Other | Admitting: Physician Assistant

## 2021-12-16 VITALS — BP 123/77 | HR 103 | Ht 69.0 in | Wt 179.1 lb

## 2021-12-16 DIAGNOSIS — J441 Chronic obstructive pulmonary disease with (acute) exacerbation: Secondary | ICD-10-CM | POA: Insufficient documentation

## 2021-12-16 DIAGNOSIS — F411 Generalized anxiety disorder: Secondary | ICD-10-CM

## 2021-12-16 DIAGNOSIS — J439 Emphysema, unspecified: Secondary | ICD-10-CM | POA: Diagnosis not present

## 2021-12-16 DIAGNOSIS — R0602 Shortness of breath: Secondary | ICD-10-CM | POA: Diagnosis not present

## 2021-12-16 DIAGNOSIS — F321 Major depressive disorder, single episode, moderate: Secondary | ICD-10-CM

## 2021-12-16 DIAGNOSIS — R062 Wheezing: Secondary | ICD-10-CM | POA: Insufficient documentation

## 2021-12-16 DIAGNOSIS — R059 Cough, unspecified: Secondary | ICD-10-CM | POA: Diagnosis not present

## 2021-12-16 DIAGNOSIS — R339 Retention of urine, unspecified: Secondary | ICD-10-CM

## 2021-12-16 MED ORDER — POTASSIUM CHLORIDE CRYS ER 20 MEQ PO TBCR: 20.0000 meq | EXTENDED_RELEASE_TABLET | Freq: Every day | ORAL | 1 refills | Status: DC

## 2021-12-16 MED ORDER — AMOXICILLIN-POT CLAVULANATE 875-125 MG PO TABS
1.0000 | ORAL_TABLET | Freq: Two times a day (BID) | ORAL | 0 refills | Status: AC
Start: 2021-12-16 — End: 2021-12-21

## 2021-12-16 MED ORDER — PREDNISONE 20 MG PO TABS: 40.0000 mg | ORAL_TABLET | Freq: Every day | ORAL | 0 refills | Status: AC

## 2021-12-16 NOTE — Progress Notes (Signed)
?  ? ? ?Established patient visit ? ? ?Patient: Gabrielle Pineda   DOB: Oct 03, 1955   66 y.o. Female  MRN: [id number] ?Visit Date: 12/16/2021 ? ?Today's healthcare provider: Mikey Kirschner, PA-C  ? ?Cc. Shortness of breath ? ?Subjective  ?  ?HPI  ?Tanija is a 66 y/o female w/ PMH of COPD who presents today with worsening SOB, cough, nasal congestion. She was seen in office 12/09/21 for the same symptoms, given a zpack and steroid taper. She called this morning and was sent in augmentin. Using albuterol inhaler/nebulizer with no improvement but helps maintain breathing ?Denies chest pain, leg swelling, dizziness, fever. ? ?Medications: ?Outpatient Medications Prior to Visit  ?Medication Sig  ? acyclovir ointment (ZOVIRAX) 5 % SMARTSIG:Topical Every 3 Hours  ? albuterol (VENTOLIN HFA) 108 (90 Base) MCG/ACT inhaler INHALE TWO PUFFS BY MOUTH INTO LUNGS every SIX hours AS NEEDED FOR WHEEZING AND/OR SHORTNESS OF BREATH  ? amoxicillin-clavulanate (AUGMENTIN) 875-125 MG tablet Take 1 tablet by mouth 2 (two) times daily for 5 days.  ? ARIPiprazole (ABILIFY) 10 MG tablet TAKE ONE TABLET BY MOUTH ONCE DAILY  ? atorvastatin (LIPITOR) 40 MG tablet Take 1 tablet (40 mg total) by mouth every morning.  ? Budeson-Glycopyrrol-Formoterol (BREZTRI AEROSPHERE) 160-9-4.8 MCG/ACT AERO Inhale 2 puffs into the lungs in the morning and at bedtime.  ? Budeson-Glycopyrrol-Formoterol (BREZTRI AEROSPHERE) 160-9-4.8 MCG/ACT AERO Inhale 2 puffs into the lungs in the morning and at bedtime.  ? busPIRone (BUSPAR) 30 MG tablet TAKE ONE TABLET BY MOUTH EVERY MORNING and TAKE ONE TABLET BY MOUTH AT NOON and TAKE ONE TABLET BY MOUTH EVERYDAY AT BEDTIME  ? carvedilol (COREG) 12.5 MG tablet TAKE ONE TABLET BY MOUTH EVERY MORNING and TAKE ONE TABLET BY MOUTH EVERYDAY AT BEDTIME  ? Eszopiclone 3 MG TABS Take 1 tablet (3 mg total) by mouth at bedtime. Take immediately before bedtime  ? ezetimibe (ZETIA) 10 MG tablet Take 1 tablet (10 mg total) by mouth every  morning.  ? gabapentin (NEURONTIN) 400 MG capsule Take 1 capsule (400 mg total) by mouth 2 (two) times daily.  ? ipratropium (ATROVENT) 0.02 % nebulizer solution Take 2.5 mLs (0.5 mg total) by nebulization 4 (four) times daily as needed for wheezing or shortness of breath.  ? leflunomide (ARAVA) 20 MG tablet Take 20 mg by mouth at bedtime.  ? LINZESS 145 MCG CAPS capsule Take 145 mcg by mouth daily before breakfast.  ? meloxicam (MOBIC) 15 MG tablet Take 15 mg by mouth at bedtime.  ? montelukast (SINGULAIR) 10 MG tablet Take 1 tablet (10 mg total) by mouth at bedtime.  ? Multiple Vitamin (MULTIVITAMIN WITH MINERALS) TABS tablet Take 1 tablet by mouth daily.  ? Nutritional Supplements (ESTROVEN ENERGY PO) Take 1 tablet by mouth daily.   ? omeprazole (PRILOSEC) 40 MG capsule Take 1 capsule (40 mg total) by mouth 2 (two) times daily.  ? oxyCODONE-acetaminophen (PERCOCET) 5-325 MG tablet Take 1 tablet by mouth every 8 (eight) hours as needed for severe pain. Must last 30 days.  ? [START ON 12/20/2021] oxyCODONE-acetaminophen (PERCOCET) 5-325 MG tablet Take 1 tablet by mouth every 8 (eight) hours as needed for severe pain. Must last 30 days.  ? potassium chloride SA (KLOR-CON M) 20 MEQ tablet Take 1 tablet (20 mEq total) by mouth daily with breakfast.  ? Spacer/Aero-Holding Chambers (OPTICHAMBER DIAMOND) MISC To assist PRN albuterol  ? topiramate (TOPAMAX) 25 MG tablet Take 1 tablet (25 mg total) by mouth 2 (two) times daily.  ?  valACYclovir (VALTREX) 1000 MG tablet Take 2 tablets (2,000 mg total) by mouth See admin instructions. Take 2 g twice daily for 5 days as needed for fever blisters  ? venlafaxine XR (EFFEXOR-XR) 75 MG 24 hr capsule Take 3 capsules (225 mg total) by mouth daily with breakfast. TAKE 3 CAPSULES BY MOUTH ONCE DAILY WITH BREAKFAST  ? vitamin B-12 (CYANOCOBALAMIN) 1000 MCG tablet Take 6,000 mcg by mouth daily.  ? vitamin E 400 UNIT capsule Take 1,600 Units by mouth daily.  ? XIIDRA 5 % SOLN Place 1 drop  into both eyes daily.  ? ?No facility-administered medications prior to visit.  ? ? ?Review of Systems  ?Constitutional:  Positive for fatigue. Negative for fever.  ?HENT:  Positive for congestion.   ?Respiratory:  Positive for cough, shortness of breath and wheezing.   ?Cardiovascular:  Negative for chest pain and leg swelling.  ?Gastrointestinal:  Negative for abdominal pain.  ?Neurological:  Negative for dizziness and headaches.  ? ? ?  Objective  ?  ?Blood pressure 123/77, pulse (!) 103, height '5\' 9"'$  (1.753 m), weight 179 lb 1.6 oz (81.2 kg), SpO2 98 %.  ?Ambulating O2, lowest O2 dropped was 94%.  ? ?Physical Exam ?Constitutional:   ?   General: She is awake.  ?   Appearance: She is well-developed.  ?HENT:  ?   Head: Normocephalic.  ?Eyes:  ?   Conjunctiva/sclera: Conjunctivae normal.  ?Cardiovascular:  ?   Rate and Rhythm: Normal rate and regular rhythm.  ?   Heart sounds: Normal heart sounds.  ?Pulmonary:  ?   Effort: Tachypnea present.  ?   Breath sounds: Wheezing present.  ?Musculoskeletal:  ?   Right lower leg: No edema.  ?   Left lower leg: No edema.  ?Skin: ?   General: Skin is warm.  ?Neurological:  ?   Mental Status: She is alert and oriented to person, place, and time.  ?Psychiatric:     ?   Attention and Perception: Attention normal.     ?   Mood and Affect: Mood normal.     ?   Speech: Speech normal.     ?   Behavior: Behavior is cooperative.  ?  ? ?No results found for any visits on 12/16/21. ? Assessment & Plan  ?  ? ?Problem List Items Addressed This Visit   ? ?  ? Respiratory  ? COPD exacerbation ([place]) - Primary  ?  Pt already rx augmentin. Advised her to take ?Will add on another 5 days of prednisone. ?Ambulating O2 low was 94%.  ?Ordered chest xray ?Advised if SOB progresses overnight, to go to ED ? ?  ?  ? Relevant Medications  ? predniSONE (DELTASONE) 20 MG tablet  ? Other Relevant Orders  ? DG Chest 2 View (Completed)  ? ?Other Visit Diagnoses   ? ? Wheezing      ? Relevant Medications  ?  predniSONE (DELTASONE) 20 MG tablet  ? Other Relevant Orders  ? DG Chest 2 View (Completed)  ? SOB (shortness of breath)      ? ?  ? Please disregard the CT chest being associated with this visit. It was accidentally released by our office staff, but the order comes from the pt's pulmonologist.  ? ?I, Mikey Kirschner, PA-C have reviewed all documentation for this visit. The documentation on  12/16/2021  for the exam, diagnosis, procedures, and orders are all accurate and complete. ? ?Mikey Kirschner, PA-C ?[facility] ?[place]  [address] ?[place], [place], [address] ?Office: [phone] ?Fax: [phone]  ? ?[facility] ? ?

## 2021-12-16 NOTE — Telephone Encounter (Signed)
[facility] faxed refill request for the following medications: ? ?potassium chloride SA (KLOR-CON) 20 MEQ tablet  ? ?Please advise. ? ?

## 2021-12-16 NOTE — Addendum Note (Signed)
Addended by: Myles Gip on: 12/16/2021 08:40 AM ? ? Modules accepted: Orders ? ?

## 2021-12-16 NOTE — Telephone Encounter (Signed)
Patient advised.KW 

## 2021-12-17 ENCOUNTER — Telehealth: Payer: Self-pay

## 2021-12-17 NOTE — Assessment & Plan Note (Signed)
Pt already rx augmentin. Advised her to take ?Will add on another 5 days of prednisone. ?Ambulating O2 low was 94%.  ?Ordered chest xray ?Advised if SOB progresses overnight, to go to ED ?

## 2021-12-17 NOTE — Progress Notes (Signed)
? ? ?Chronic Care Management ?Pharmacy Assistant  ? ?Name: Gabrielle Pineda  MRN: [id number] DOB: January 26, 1956 ? ?Reason for Encounter: Medication Review/Medication Coordination Call for [facility] ?  ?Recent office visits:  ?12/16/2021 Mikey Kirschner, PA-C (PCP Office Visit) for Wheezing- Started: Prednisone 40 mg daily, Chest XR ordered  ? ?12/09/2021 Rory Percy, DO (PCP Office Visit) for Cough/SOB- Started: Azithromycin 250 mg, Prednisone 40 mg Daily, No orders placed, No follow-up noted ? ?Recent consult visits:  ?12/15/2021 Beverly Gust, [place] (Rheumatology) for Follow-up- Started: Sulfasalazine 500 mg tablet take 1 tablet daily for 7 days, then 1 tablet twice daily for 7 days, then 2 tablets twice daily for 76-Days. No orders placed, Patient to follow-up in 6 weeks ? ?12/11/2021 [name], MD (Pulmonary) for Pulmonary Consult- Started: Budeson-Glycopyrrol-Formoterol 160-9-4.8 mcg twice daily, Stopped: Fluticasone-Umeclidin-Vilant 100-62.5-25 mcg, Sulfamethoxazole-Trimethoprim 800-160 mg, CT Chest High Resolution ordered, Pulmonary Function Test ordered, Patient will follow-up in 3-4 weeks. ? ?11/21/2021 Ida Rogue, MD (Cardiology) for 12 month Follow-up- No medication changes noted, EKG 12-Lead order placed, patient to follow-up in 12 months ? ?Hospital visits:  ?None in previous 6 months ? ?Medications: ?Outpatient Encounter Medications as of 12/17/2021  ?Medication Sig  ? acyclovir ointment (ZOVIRAX) 5 % SMARTSIG:Topical Every 3 Hours  ? albuterol (VENTOLIN HFA) 108 (90 Base) MCG/ACT inhaler INHALE TWO PUFFS BY MOUTH INTO LUNGS every SIX hours AS NEEDED FOR WHEEZING AND/OR SHORTNESS OF BREATH  ? amoxicillin-clavulanate (AUGMENTIN) 875-125 MG tablet Take 1 tablet by mouth 2 (two) times daily for 5 days.  ? ARIPiprazole (ABILIFY) 10 MG tablet TAKE ONE TABLET BY MOUTH ONCE DAILY  ? atorvastatin (LIPITOR) 40 MG tablet Take 1 tablet (40 mg total) by mouth every morning.  ?  Budeson-Glycopyrrol-Formoterol (BREZTRI AEROSPHERE) 160-9-4.8 MCG/ACT AERO Inhale 2 puffs into the lungs in the morning and at bedtime.  ? Budeson-Glycopyrrol-Formoterol (BREZTRI AEROSPHERE) 160-9-4.8 MCG/ACT AERO Inhale 2 puffs into the lungs in the morning and at bedtime.  ? busPIRone (BUSPAR) 30 MG tablet TAKE ONE TABLET BY MOUTH EVERY MORNING and TAKE ONE TABLET BY MOUTH AT NOON and TAKE ONE TABLET BY MOUTH EVERYDAY AT BEDTIME  ? carvedilol (COREG) 12.5 MG tablet TAKE ONE TABLET BY MOUTH EVERY MORNING and TAKE ONE TABLET BY MOUTH EVERYDAY AT BEDTIME  ? Eszopiclone 3 MG TABS Take 1 tablet (3 mg total) by mouth at bedtime. Take immediately before bedtime  ? ezetimibe (ZETIA) 10 MG tablet Take 1 tablet (10 mg total) by mouth every morning.  ? gabapentin (NEURONTIN) 400 MG capsule Take 1 capsule (400 mg total) by mouth 2 (two) times daily.  ? ipratropium (ATROVENT) 0.02 % nebulizer solution Take 2.5 mLs (0.5 mg total) by nebulization 4 (four) times daily as needed for wheezing or shortness of breath.  ? leflunomide (ARAVA) 20 MG tablet Take 20 mg by mouth at bedtime.  ? LINZESS 145 MCG CAPS capsule Take 145 mcg by mouth daily before breakfast.  ? meloxicam (MOBIC) 15 MG tablet Take 15 mg by mouth at bedtime.  ? montelukast (SINGULAIR) 10 MG tablet Take 1 tablet (10 mg total) by mouth at bedtime.  ? Multiple Vitamin (MULTIVITAMIN WITH MINERALS) TABS tablet Take 1 tablet by mouth daily.  ? Nutritional Supplements (ESTROVEN ENERGY PO) Take 1 tablet by mouth daily.   ? omeprazole (PRILOSEC) 40 MG capsule Take 1 capsule (40 mg total) by mouth 2 (two) times daily.  ? oxyCODONE-acetaminophen (PERCOCET) 5-325 MG tablet Take 1 tablet by mouth every 8 (eight) hours as  needed for severe pain. Must last 30 days.  ? [START ON 12/20/2021] oxyCODONE-acetaminophen (PERCOCET) 5-325 MG tablet Take 1 tablet by mouth every 8 (eight) hours as needed for severe pain. Must last 30 days.  ? potassium chloride SA (KLOR-CON M) 20 MEQ tablet  Take 1 tablet (20 mEq total) by mouth daily with breakfast.  ? predniSONE (DELTASONE) 20 MG tablet Take 2 tablets (40 mg total) by mouth daily with breakfast for 5 days.  ? Spacer/Aero-Holding Chambers (OPTICHAMBER DIAMOND) MISC To assist PRN albuterol  ? topiramate (TOPAMAX) 25 MG tablet Take 1 tablet (25 mg total) by mouth 2 (two) times daily.  ? valACYclovir (VALTREX) 1000 MG tablet Take 2 tablets (2,000 mg total) by mouth See admin instructions. Take 2 g twice daily for 5 days as needed for fever blisters  ? venlafaxine XR (EFFEXOR-XR) 75 MG 24 hr capsule Take 3 capsules (225 mg total) by mouth daily with breakfast. TAKE 3 CAPSULES BY MOUTH ONCE DAILY WITH BREAKFAST  ? vitamin B-12 (CYANOCOBALAMIN) 1000 MCG tablet Take 6,000 mcg by mouth daily.  ? vitamin E 400 UNIT capsule Take 1,600 Units by mouth daily.  ? XIIDRA 5 % SOLN Place 1 drop into both eyes daily.  ? ?No facility-administered encounter medications on file as of 12/17/2021.  ? ?Care Gaps: ?Zoster Vaccines ?Mammogram ?Dexa Scan  ? ?Star Rating Drugs: ?Atorvastatin 40 mg last filled on 11/21/2021 for a 30-Day supply with [facility] ? ?BP Readings from Last 3 Encounters:  ?12/16/21 123/77  ?12/11/21 128/76  ?12/09/21 137/83  ?  ?Lab Results  ?Component Value Date  ? HGBA1C 5.3 06/26/2021  ? ?Patient obtains medications through Adherence Packaging  30 Days  ? ?Last adherence delivery included:  ?Leflunomide 20 mg tablet- Take one tablet by mouth once daily (Bedtime) ?Buspirone 30 mg tablet- Take one tablet by mouth three times daily (Breakfast, Lunch, Bedtime) ?Aripiprazole 10 mg tablet- Take one tablet by mouth daily (Breakfast) ?Ezetimibe (Zetia) 10 mg tablet Take one tablet by mouth daily (Breakfast) ?Atorvastatin 40 mg tablet- Take one tablet by mouth daily (Breakfast) ?Carvedilol 12.5 mg tablet - Take on tablet by mouth twice daily (Breakfast, Bedtime) ?Omeprazole 40 mg- Take one capsule by mouth two times a day (Breakfast, Bedtime) ?Venlafaxine  ER 75 mg capsule- Take three capsules by mouth daily (Breakfast) ?Potassium 20 mEq - Take one capsule daily (Breakfast) ?Meloxicam 15 mg tablet- Take one tablet by mouth once daily (Bedtime) ?Linzess 145 mcg- Take one tablet by mouth daily (Before Breakfast) (Vials) ?Topiramate 25 mg tablet-Take one tablet by mouth twice daily (Breakfast, Bedtime) ?Trelegy 100-62.5-25 MCG- Inhale 1 puff daily ?Eszopiclone 3 mg tablet - Take one tablet by mouth nightly (Bedtime) ?Gabapentin 400 mg tablet- Take one tablet by mouth twice daily (Breakfast, Bedtime) ? ?Patient declined medications last month: ?Albuterol 90 mcg 2 puffs every 6 hours prn - PRN Medication patient has ample supply at this time ? ?Patient is due for next adherence delivery on: 12/29/2021 (Monday) 2nd Route after 3 pm. ? ?Called patient and reviewed medications and coordinated delivery. ? ?This delivery to include: ?Leflunomide 20 mg tablet- Take one tablet by mouth once daily (Bedtime) ?Buspirone 30 mg tablet- Take one tablet by mouth three times daily (Breakfast, Lunch, Bedtime) ?Aripiprazole 10 mg tablet- Take one tablet by mouth daily (Breakfast) ?Ezetimibe (Zetia) 10 mg tablet Take one tablet by mouth daily (Breakfast) ?Atorvastatin 40 mg tablet- Take one tablet by mouth daily (Breakfast) ?Carvedilol 12.5 mg tablet - Take on tablet  by mouth twice daily (Breakfast, Bedtime) ?Omeprazole 40 mg- Take one capsule by mouth two times a day (Breakfast, Bedtime) ?Venlafaxine ER 75 mg capsule- Take three capsules by mouth daily (Breakfast) ?Potassium 20 mEq - Take one capsule daily (Breakfast) ?Meloxicam 15 mg tablet- Take one tablet by mouth once daily (Bedtime) ?Linzess 145 mcg- Take one tablet by mouth daily (Before Breakfast) (Vials) ?Topiramate 25 mg tablet-Take one tablet by mouth twice daily (Breakfast, Bedtime) ?Trelegy 100-62.5-25 MCG- Inhale 1 puff daily ?Eszopiclone 3 mg tablet - Take one tablet by mouth nightly (Bedtime) ?Gabapentin 400 mg tablet- Take  one tablet by mouth twice daily (Breakfast, Bedtime) ?Montelukast (Singulair) 10 mg tablet 1 tablet daily (Bedtime) ?Albuterol 90 mcg 2 puffs every 6 hours prn - PRN Medication patient has ample supply at this time

## 2021-12-18 ENCOUNTER — Ambulatory Visit
Admission: RE | Admit: 2021-12-18 | Discharge: 2021-12-18 | Disposition: A | Discharge status: Home / Self Care | Payer: Medicare Other | Source: Ambulatory Visit | Attending: Pulmonary Disease | Admitting: Pulmonary Disease

## 2021-12-18 DIAGNOSIS — R0602 Shortness of breath: Secondary | ICD-10-CM | POA: Diagnosis not present

## 2021-12-18 DIAGNOSIS — I2510 Atherosclerotic heart disease of native coronary artery without angina pectoris: Secondary | ICD-10-CM | POA: Diagnosis not present

## 2021-12-18 DIAGNOSIS — I700 Atherosclerosis of aorta: Secondary | ICD-10-CM | POA: Diagnosis not present

## 2021-12-18 DIAGNOSIS — J929 Pleural plaque without asbestos: Secondary | ICD-10-CM | POA: Diagnosis not present

## 2021-12-18 DIAGNOSIS — J432 Centrilobular emphysema: Secondary | ICD-10-CM | POA: Diagnosis not present

## 2021-12-19 ENCOUNTER — Telehealth: Payer: Self-pay | Admitting: Pulmonary Disease

## 2021-12-19 DIAGNOSIS — R911 Solitary pulmonary nodule: Secondary | ICD-10-CM

## 2021-12-19 NOTE — Telephone Encounter (Signed)
Tyler Pita, MD  Linwood Dibbles, CMA ?Her CT did not show evidence of lung scarring.  She does have emphysema.  There are some small nodules (spots) in the lung that do not look suspicious at this point.  She should have a regular follow-up CT without contrast in 6 months time ? ? ?Patient is aware of results and voiced her understanding.  ?CT ordered.  ?Nothing further needed.  ? ?

## 2021-12-23 ENCOUNTER — Ambulatory Visit
Admission: RE | Admit: 2021-12-23 | Discharge: 2021-12-23 | Disposition: A | Discharge status: Home / Self Care | Payer: Medicare Other | Source: Ambulatory Visit | Attending: Family Medicine | Admitting: Family Medicine

## 2021-12-23 DIAGNOSIS — M8589 Other specified disorders of bone density and structure, multiple sites: Secondary | ICD-10-CM | POA: Insufficient documentation

## 2021-12-23 DIAGNOSIS — J449 Chronic obstructive pulmonary disease, unspecified: Secondary | ICD-10-CM | POA: Diagnosis not present

## 2021-12-23 DIAGNOSIS — Z13820 Encounter for screening for osteoporosis: Secondary | ICD-10-CM | POA: Diagnosis not present

## 2021-12-23 DIAGNOSIS — Z1231 Encounter for screening mammogram for malignant neoplasm of breast: Secondary | ICD-10-CM | POA: Insufficient documentation

## 2021-12-23 DIAGNOSIS — Z87891 Personal history of nicotine dependence: Secondary | ICD-10-CM | POA: Diagnosis not present

## 2021-12-23 DIAGNOSIS — Z780 Asymptomatic menopausal state: Secondary | ICD-10-CM | POA: Insufficient documentation

## 2021-12-26 ENCOUNTER — Encounter: Payer: Self-pay | Admitting: Psychiatry

## 2021-12-26 ENCOUNTER — Ambulatory Visit (INDEPENDENT_AMBULATORY_CARE_PROVIDER_SITE_OTHER): Payer: Medicare Other | Admitting: Psychiatry

## 2021-12-26 VITALS — BP 128/86 | HR 79 | Temp 98.4°F | Wt 181.2 lb

## 2021-12-26 DIAGNOSIS — F1021 Alcohol dependence, in remission: Secondary | ICD-10-CM

## 2021-12-26 DIAGNOSIS — F3342 Major depressive disorder, recurrent, in full remission: Secondary | ICD-10-CM | POA: Diagnosis not present

## 2021-12-26 DIAGNOSIS — F411 Generalized anxiety disorder: Secondary | ICD-10-CM | POA: Insufficient documentation

## 2021-12-26 DIAGNOSIS — F5101 Primary insomnia: Secondary | ICD-10-CM | POA: Diagnosis not present

## 2021-12-26 MED ORDER — MIRTAZAPINE 7.5 MG PO TABS: 7.5000 mg | ORAL_TABLET | Freq: Every day | ORAL | 0 refills | Status: DC

## 2021-12-26 MED ORDER — BUSPIRONE HCL 30 MG PO TABS: 15.0000 mg | ORAL_TABLET | Freq: Two times a day (BID) | ORAL | 0 refills | Status: DC

## 2021-12-26 NOTE — Patient Instructions (Addendum)
Please start taking BuSpar half tablet of 30 mg twice a day for 1 week and stop taking it. ? ?Please stop taking Lunesta 1 mg at bedtime since you are going to start this new sleep medication. ? ?Mirtazapine Tablets ?What is this medication? ?MIRTAZAPINE (mir TAZ a peen) treats depression. It increases the amount of serotonin and norepinephrine in the brain, hormones that help regulate mood. ?This medicine may be used for other purposes; ask your health care provider or pharmacist if you have questions. ?COMMON BRAND NAME(S): Remeron ?What should I tell my care team before I take this medication? ?They need to know if you have any of these conditions: ?Bipolar disorder ?Glaucoma ?Kidney disease ?Liver disease ?Suicidal thoughts ?An unusual or allergic reaction to mirtazapine, other medications, foods, dyes, or preservatives ?Pregnant or trying to get pregnant ?Breast-feeding ?How should I use this medication? ?Take this medication by mouth with a glass of water. Follow the directions on the prescription label. Take your medication at regular intervals. Do not take your medication more often than directed. Do not stop taking this medication suddenly except upon the advice of your care team. Stopping this medication too quickly may cause serious side effects or your condition may worsen. ?A special MedGuide will be given to you by the pharmacist with each prescription and refill. Be sure to read this information carefully each time. ?Talk to your care team about the use of this medication in children. Special care may be needed. ?Overdosage: If you think you have taken too much of this medicine contact a poison control center or emergency room at once. ?NOTE: This medicine is only for you. Do not share this medicine with others. ?What if I miss a dose? ?If you miss a dose, take it as soon as you can. If it is almost time for your next dose, take only that dose. Do not take double or extra doses. ?What may interact  with this medication? ?Do not take this medication with any of the following: ?Linezolid ?MAOIs like Carbex, Eldepryl, Marplan, Nardil, and Parnate ?Methylene blue (injected into a vein) ?This medication may also interact with the following: ?Alcohol ?Antiviral medications for HIV or AIDS ?Certain medications that treat or prevent blood clots like warfarin ?Certain medications for depression, anxiety, or psychotic disturbances ?Certain medications for fungal infections like ketoconazole and itraconazole ?Certain medications for migraine headache like almotriptan, eletriptan, frovatriptan, naratriptan, rizatriptan, sumatriptan, zolmitriptan ?Certain medications for seizures like carbamazepine or phenytoin ?Certain medications for sleep ?Cimetidine ?Erythromycin ?Fentanyl ?Lithium ?Medications for blood pressure ?Nefazodone ?Rasagiline ?Rifampin ?Supplements like St. John's wort, kava kava, valerian ?Tramadol ?Tryptophan ?This list may not describe all possible interactions. Give your health care provider a list of all the medicines, herbs, non-prescription drugs, or dietary supplements you use. Also tell them if you smoke, drink alcohol, or use illegal drugs. Some items may interact with your medicine. ?What should I watch for while using this medication? ?Tell your care team if your symptoms do not get better or if they get worse. Visit your care team for regular checks on your progress. Because it may take several weeks to see the full effects of this medication, it is important to continue your treatment as prescribed by your doctor. ?Patients and their families should watch out for new or worsening thoughts of suicide or depression. Also watch out for sudden changes in feelings such as feeling anxious, agitated, panicky, irritable, hostile, aggressive, impulsive, severely restless, overly excited and hyperactive, or not being able to  sleep. If this happens, especially at the beginning of treatment or after a  change in dose, call your care team. ?You may get drowsy or dizzy. Do not drive, use machinery, or do anything that needs mental alertness until you know how this medication affects you. Do not stand or sit up quickly, especially if you are an older patient. This reduces the risk of dizzy or fainting spells. Alcohol may interfere with the effect of this medication. Avoid alcoholic drinks. ?This medication may cause dry eyes and blurred vision. If you wear contact lenses you may feel some discomfort. Lubricating drops may help. See your eye care team if the problem does not go away or is severe. ?Your mouth may get dry. Chewing sugarless gum or sucking hard candy, and drinking plenty of water may help. Contact your care team if the problem does not go away or is severe. ?What side effects may I notice from receiving this medication? ?Side effects that you should report to your care team as soon as possible: ?Allergic reactions--skin rash, itching, hives, swelling of the face, lips, tongue, or throat ?Heart rhythm changes--fast or irregular heartbeat, dizziness, feeling faint or lightheaded, chest pain, trouble breathing ?Infection--fever, chills, cough, or sore throat ?Irritability, confusion, fast or irregular heartbeat, muscle stiffness, twitching muscles, sweating, high fever, seizure, chills, vomiting, diarrhea, which may be signs of serotonin syndrome ?Low sodium level--muscle weakness, fatigue, dizziness, headache, confusion ?Rash, fever, and swollen lymph nodes ?Redness, blistering, peeling or loosening of the skin, including inside the mouth ?Seizures ?Sudden eye pain or change in vision such as blurry vision, seeing halos around lights, vision loss ?Thoughts of suicide or self-harm, worsening mood, feelings of depression ?Side effects that usually do not require medical attention (report to your care team if they continue or are bothersome): ?Constipation ?Dizziness ?Drowsiness ?Dry mouth ?Increase in  appetite ?Weight gain ?This list may not describe all possible side effects. Call your doctor for medical advice about side effects. You may report side effects to FDA at 1-800-FDA-1088. ?Where should I keep my medication? ?Keep out of the reach of children. ?Store at room temperature between 15 and 30 degrees C (59 and 86 degrees F) Protect from light and moisture. Throw away any unused medication after the expiration date. ?NOTE: This sheet is a summary. It may not cover all possible information. If you have questions about this medicine, talk to your doctor, pharmacist, or health care provider. ?? 2023 Elsevier/Gold Standard (2020-10-30 00:00:00) ? ?

## 2021-12-26 NOTE — Progress Notes (Deleted)
error 
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
## 2022-01-15 ENCOUNTER — Telehealth: Payer: Self-pay

## 2022-01-15 ENCOUNTER — Ambulatory Visit
Payer: Medicare Other | Attending: Student in an Organized Health Care Education/Training Program | Admitting: Student in an Organized Health Care Education/Training Program

## 2022-01-15 ENCOUNTER — Encounter: Payer: Self-pay | Admitting: Student in an Organized Health Care Education/Training Program

## 2022-01-15 VITALS — BP 153/91 | HR 79 | Temp 96.4°F | Resp 18 | Ht 69.0 in | Wt 170.0 lb

## 2022-01-15 DIAGNOSIS — M47812 Spondylosis without myelopathy or radiculopathy, cervical region: Secondary | ICD-10-CM | POA: Insufficient documentation

## 2022-01-15 DIAGNOSIS — G894 Chronic pain syndrome: Secondary | ICD-10-CM | POA: Insufficient documentation

## 2022-01-15 DIAGNOSIS — Z96641 Presence of right artificial hip joint: Secondary | ICD-10-CM | POA: Insufficient documentation

## 2022-01-15 DIAGNOSIS — M961 Postlaminectomy syndrome, not elsewhere classified: Secondary | ICD-10-CM | POA: Diagnosis not present

## 2022-01-15 DIAGNOSIS — M5416 Radiculopathy, lumbar region: Secondary | ICD-10-CM | POA: Diagnosis not present

## 2022-01-15 DIAGNOSIS — G629 Polyneuropathy, unspecified: Secondary | ICD-10-CM | POA: Insufficient documentation

## 2022-01-15 DIAGNOSIS — M5030 Other cervical disc degeneration, unspecified cervical region: Secondary | ICD-10-CM | POA: Insufficient documentation

## 2022-01-15 MED ORDER — OXYCODONE-ACETAMINOPHEN 5-325 MG PO TABS: 1.0000 | ORAL_TABLET | Freq: Three times a day (TID) | ORAL | 0 refills | Status: AC | PRN

## 2022-01-15 MED ORDER — OXYCODONE-ACETAMINOPHEN 5-325 MG PO TABS: 1.0000 | ORAL_TABLET | Freq: Three times a day (TID) | ORAL | 0 refills | Status: DC | PRN

## 2022-01-15 NOTE — Progress Notes (Unsigned)
Chronic Care Management Pharmacy Assistant   Name: Gabrielle Pineda  MRN: [id number] DOB: May 26, 1956  Reason for Encounter: Medication Review/Medication Coordination Call for [facility]   Recent office visits:  None ID  Recent consult visits:  12/26/2021 Ursula Alert, MD ([place]) I am unsure of any changes as I didn't crack the screen to access the note  Hospital visits:  None in previous 6 months  Medications: Outpatient Encounter Medications as of 01/15/2022  Medication Sig   acyclovir ointment (ZOVIRAX) 5 % SMARTSIG:Topical Every 3 Hours   albuterol (VENTOLIN HFA) 108 (90 Base) MCG/ACT inhaler INHALE TWO PUFFS BY MOUTH INTO LUNGS every SIX hours AS NEEDED FOR WHEEZING AND/OR SHORTNESS OF BREATH   ARIPiprazole (ABILIFY) 10 MG tablet TAKE ONE TABLET BY MOUTH ONCE DAILY   atorvastatin (LIPITOR) 40 MG tablet Take 1 tablet (40 mg total) by mouth every morning.   Budeson-Glycopyrrol-Formoterol (BREZTRI AEROSPHERE) 160-9-4.8 MCG/ACT AERO Inhale 2 puffs into the lungs in the morning and at bedtime.   busPIRone (BUSPAR) 30 MG tablet Take 0.5 tablets (15 mg total) by mouth 2 (two) times daily. For 1 week and stop   carvedilol (COREG) 12.5 MG tablet TAKE ONE TABLET BY MOUTH EVERY MORNING and TAKE ONE TABLET BY MOUTH EVERYDAY AT BEDTIME   ezetimibe (ZETIA) 10 MG tablet Take 1 tablet (10 mg total) by mouth every morning.   gabapentin (NEURONTIN) 400 MG capsule Take 1 capsule (400 mg total) by mouth 2 (two) times daily.   ipratropium (ATROVENT) 0.02 % nebulizer solution Take 2.5 mLs (0.5 mg total) by nebulization 4 (four) times daily as needed for wheezing or shortness of breath.   leflunomide (ARAVA) 20 MG tablet Take 20 mg by mouth at bedtime.   LINZESS 145 MCG CAPS capsule Take 145 mcg by mouth daily before breakfast.   meloxicam (MOBIC) 15 MG tablet Take 15 mg by mouth at bedtime.   mirtazapine (REMERON) 7.5 MG tablet Take 1 tablet (7.5 mg total) by mouth at bedtime.    montelukast (SINGULAIR) 10 MG tablet Take 1 tablet (10 mg total) by mouth at bedtime.   Multiple Vitamin (MULTIVITAMIN WITH MINERALS) TABS tablet Take 1 tablet by mouth daily.   Nutritional Supplements (ESTROVEN ENERGY PO) Take 1 tablet by mouth daily.    omeprazole (PRILOSEC) 40 MG capsule Take 1 capsule (40 mg total) by mouth 2 (two) times daily.   [START ON 02/18/2022] oxyCODONE-acetaminophen (PERCOCET) 5-325 MG tablet Take 1 tablet by mouth every 8 (eight) hours as needed for severe pain. Must last 30 days.   [START ON 03/20/2022] oxyCODONE-acetaminophen (PERCOCET) 5-325 MG tablet Take 1 tablet by mouth every 8 (eight) hours as needed for severe pain. Must last 30 days.   potassium chloride SA (KLOR-CON M) 20 MEQ tablet Take 1 tablet (20 mEq total) by mouth daily with breakfast.   topiramate (TOPAMAX) 25 MG tablet Take 1 tablet (25 mg total) by mouth 2 (two) times daily.   valACYclovir (VALTREX) 1000 MG tablet Take 2 tablets (2,000 mg total) by mouth See admin instructions. Take 2 g twice daily for 5 days as needed for fever blisters   venlafaxine XR (EFFEXOR-XR) 75 MG 24 hr capsule Take 3 capsules (225 mg total) by mouth daily with breakfast. TAKE 3 CAPSULES BY MOUTH ONCE DAILY WITH BREAKFAST   vitamin B-12 (CYANOCOBALAMIN) 1000 MCG tablet Take 6,000 mcg by mouth daily.   XIIDRA 5 % SOLN Place 1 drop into both eyes daily.   No facility-administered encounter  medications on file as of 01/15/2022.   Care Gaps: Zoster Vaccine COVID-19 Vaccine Booster 3  Star Rating Drugs: Atorvastatin 40 mg last filled on 12/22/2021 for a 30-Day supply with [facility]  BP Readings from Last 3 Encounters:  01/15/22 (!) 153/91  12/16/21 123/77  12/11/21 128/76    Lab Results  Component Value Date   HGBA1C 5.3 06/26/2021    Patient obtains medications through Adherence Packaging  30 Days   Last adherence delivery included:  Leflunomide 20 mg tablet- Take one tablet by mouth once daily  (Bedtime) Buspirone 30 mg tablet- Take one tablet by mouth three times daily (Breakfast, Lunch, Bedtime) Aripiprazole 10 mg tablet- Take one tablet by mouth daily (Breakfast) Ezetimibe (Zetia) 10 mg tablet Take one tablet by mouth daily (Breakfast) Atorvastatin 40 mg tablet- Take one tablet by mouth daily (Breakfast) Carvedilol 12.5 mg tablet - Take on tablet by mouth twice daily (Breakfast, Bedtime) Omeprazole 40 mg- Take one capsule by mouth two times a day (Breakfast, Bedtime) Venlafaxine ER 75 mg capsule- Take three capsules by mouth daily (Breakfast) Potassium 20 mEq - Take one capsule daily (Breakfast) Meloxicam 15 mg tablet- Take one tablet by mouth once daily (Bedtime) Linzess 145 mcg- Take one tablet by mouth daily (Before Breakfast) (Vials) Topiramate 25 mg tablet-Take one tablet by mouth twice daily (Breakfast, Bedtime) Trelegy 100-62.5-25 MCG- Inhale 1 puff daily Eszopiclone 3 mg tablet - Take one tablet by mouth nightly (Bedtime) Gabapentin 400 mg tablet- Take one tablet by mouth twice daily (Breakfast, Bedtime) Montelukast (Singulair) 10 mg tablet 1 tablet daily (Bedtime) Albuterol 90 mcg 2 puffs every 6 hours prn - PRN Medication patient has ample supply at this time Breztri Inhaler 160-9-4.8 2 puffs in the morning and at bedtime  Patient declined the following medications: No Medications were declined this month  Patient is due for next adherence delivery on: 01/27/2022 (Tuesday) [facility] [address].  Called patient and reviewed medications and coordinated delivery.  This delivery to include: Leflunomide 20 mg tablet- Take one tablet by mouth once daily (Bedtime) Buspirone 30 mg tablet- Take one tablet by mouth three times daily (Breakfast, Lunch, Bedtime) Aripiprazole 10 mg tablet- Take one tablet by mouth daily (Breakfast) Ezetimibe (Zetia) 10 mg tablet Take one tablet by mouth daily (Breakfast) Atorvastatin 40 mg tablet- Take one tablet by mouth daily (Breakfast) Carvedilol  12.5 mg tablet - Take on tablet by mouth twice daily (Breakfast, Bedtime) Omeprazole 40 mg- Take one capsule by mouth two times a day (Breakfast, Bedtime) Venlafaxine ER 75 mg capsule- Take three capsules by mouth daily (Breakfast) Potassium 20 mEq - Take one capsule daily (Breakfast) Meloxicam 15 mg tablet- Take one tablet by mouth once daily (Bedtime) Linzess 145 mcg- Take one tablet by mouth daily (Before Breakfast) (Vials) Topiramate 25 mg tablet-Take one tablet by mouth twice daily (Breakfast, Bedtime) Trelegy 100-62.5-25 MCG- Inhale 1 puff daily Eszopiclone 3 mg tablet - Take one tablet by mouth nightly (Bedtime) Gabapentin 400 mg tablet- Take one tablet by mouth twice daily (Breakfast, Bedtime) Montelukast (Singulair) 10 mg tablet 1 tablet daily (Bedtime) Breztri Inhaler 160-9-4.8 2 puffs in the morning and at bedtime  Patient declined the following medications: Albuterol 90 mcg 2 puffs every 6 hours prn - PRN Medication patient has ample supply at this time  Patient needs refills for: No refills needed this month  Patient is requesting a new delivery date of 06/09 as she will be leaving town on 06/10 and will not be there to get  her medications on 06/13.  I have sent message to [facility] to confirm they can deliver on the 9th and informed patient I would let her know if they can't. Patient is aware that they will call her the day of her delivery.   Patient has a telephone appointment with Junius Argyle, CPP on 01/19/2022 @ 1100- Patient reminded about appointment  06/01 Left HIPAA compliant VM requesting patient to return my call.  Lynann Bologna, CPA/CMA Clinical Pharmacist Assistant Phone: [phone]

## 2022-01-15 NOTE — Progress Notes (Signed)
PROVIDER NOTE: Information contained herein reflects review and annotations entered in association with encounter. Interpretation of such information and data should be left to medically-trained personnel. Information provided to patient can be located elsewhere in the medical record under "Patient Instructions". Document created using STT-dictation technology, any transcriptional errors that may result from process are unintentional.    Patient: Gabrielle Pineda  Service Category: E/M  Provider: [name], MD  DOB: [birth year]  DOS: 01/15/2022  Specialty: Interventional Pain Management  MRN: [id number]  Setting: Ambulatory outpatient  PCP: [place]  Type: Established Patient    Referring Provider: Gwyneth Sprout, FNP  Location: Office  Delivery: Face-to-face     HPI  Gabrielle Pineda, a 66 y.o. year old female, is here today because of her Spondylosis of cervical region without myelopathy or radiculopathy [M47.812]. Gabrielle Pineda primary complain today is Back Pain (lower)  Last encounter: My last encounter with her was on 10/21/2021  Pertinent problems: Gabrielle Pineda has Closed compression fracture of L5 lumbar vertebra; Cervical radiculopathy; Neuropathy; Spinal stenosis, lumbar region, with neurogenic claudication; Spondylosis of cervical region without myelopathy or radiculopathy; Cervical fusion syndrome; Chronic pain syndrome; Postlaminectomy syndrome, lumbar region; Chronic radicular lumbar pain; and Status post total hip replacement, right on their pertinent problem list. Pain Assessment: Severity of Chronic pain is reported as a 3 /10. Location: Back Lower/both legs to the knees. Onset: More than a month ago. Quality: Aching. Timing: Constant. Modifying factor(s): medications. Vitals:  height is _0  (1.753 m) and weight is 170 lb (77.1 kg). Her temporal temperature is 96.4 F (35.8 C) (abnormal). Her blood pressure is 153/91 (abnormal) and her pulse is 79. Her  respiration is 18 and oxygen saturation is 94%.   Reason for encounter:    Patient follows up today for medication management.  Overall she is doing well and no significant changes in her pain history since her last visit with me.  No visits to urgent care, ED or any recent falls.  She continues her care with psychiatry.  BuSpar was recently reduced in dose.  She is taking gabapentin 400 mg twice a day and states that the lower dose has helped with her memory and cognitive function as she was more "cloudy" at a higher dose of gabapentin.  She continues Percocet as prescribed.  No side effects of constipation or nausea.  Overall is doing well.  ROS  Constitutional: Denies any fever or chills Gastrointestinal: No reported hemesis, hematochezia, vomiting, or acute GI distress Musculoskeletal:  Low back, right hip pain Neurological: No reported episodes of acute onset apraxia, aphasia, dysarthria, agnosia, amnesia, paralysis, loss of coordination, or loss of consciousness  Medication Review  ARIPiprazole, Budeson-Glycopyrrol-Formoterol, Lifitegrast, Misc Natural Products, acyclovir ointment, albuterol, atorvastatin, busPIRone, carvedilol, ezetimibe, gabapentin, ipratropium, leflunomide, linaclotide, meloxicam, mirtazapine, montelukast, multivitamin with minerals, omeprazole, oxyCODONE-acetaminophen, potassium chloride SA, topiramate, valACYclovir, venlafaxine XR, and vitamin B-12  History Review  Allergy: Gabrielle Pineda is allergic to plaquenil [hydroxychloroquine]. Drug: Gabrielle Pineda  reports no history of drug use. Alcohol:  reports no history of alcohol use. Tobacco:  reports that she quit smoking about 14 months ago. Her smoking use included cigarettes. She has a 80.00 pack-year smoking history. She has never used smokeless tobacco. Social: Gabrielle Pineda  reports that she quit smoking about 14 months ago. Her smoking use included cigarettes. She has a 80.00 pack-year smoking history. She has  never used smokeless tobacco. She reports that she does not drink alcohol and does  not use drugs. Medical:  has a past medical history of Alcohol abuse, Anemia, Anxiety, Arthritis, Cervicalgia, Cirrhosis ([place]) (1994), COPD (chronic obstructive pulmonary disease) ([place]), Depression, Difficult intubation, Dyspnea, GERD (gastroesophageal reflux disease), Headache, Heart murmur, Hypertension, Other and unspecified hyperlipidemia, Status post insertion of spinal cord stimulator, and Tachycardia. Surgical: Gabrielle Pineda  has a past surgical history that includes neck disc surgery; Breast enhancement surgery (1981); Esophagogastroduodenoscopy; Tonsillectomy and adenoidectomy (1973 ); Carpal tunnel release (11/11/2011); Carpal tunnel release (2013); Rotator cuff repair (06/16/2016); Rotator cuff repair; Colonoscopy with propofol (N/A, 11/01/2017); Augmentation mammaplasty (Bilateral, 1982); Anterior cervical decomp/discectomy fusion (2012, 2015, 2018); Lumbar laminectomy/decompression microdiscectomy (N/A, 08/15/2019); Spinal cord stimulator insertion (N/A, 03/17/2021); and Total hip arthroplasty (Right, 04/15/2021). Family: family history includes Alcohol abuse in her father, mother, and sister; Bipolar disorder in her mother; Depression in her sister; Drug abuse in her sister; Pancreatic cancer in her father; Suicidality in her mother.  Laboratory Chemistry Profile   Renal Lab Results  Component Value Date   BUN 5 (L) 11/17/2021   CREATININE 0.99 11/17/2021   BCR 5 (L) 11/17/2021   GFRAA 72 05/06/2020   GFRNONAA >60 04/16/2021    Hepatic Lab Results  Component Value Date   AST 21 11/17/2021   ALT 18 11/17/2021   ALBUMIN 4.2 11/17/2021   ALKPHOS 173 (H) 11/17/2021   LIPASE 63 03/23/2018    Electrolytes Lab Results  Component Value Date   NA 142 11/17/2021   K 4.7 11/17/2021   CL 109 (H) 11/17/2021   CALCIUM 9.4 11/17/2021   PHOS 3.2 05/06/2020    Bone No results found for: VD25OH, VD125OH2TOT,  AO1308MV7, QI6962XB2, 25OHVITD1, 25OHVITD2, 25OHVITD3, TESTOFREE, TESTOSTERONE  Inflammation (CRP: Acute Phase) (ESR: Chronic Phase) No results found for: CRP, ESRSEDRATE, LATICACIDVEN       Note: Above Lab results reviewed.  Recent Imaging Review  MM 3D SCREEN BREAST W/IMPLANT BILATERAL CLINICAL DATA:  Screening.  EXAM: DIGITAL SCREENING BILATERAL MAMMOGRAM WITH IMPLANTS, CAD AND TOMOSYNTHESIS  TECHNIQUE: Bilateral screening digital craniocaudal and mediolateral oblique mammograms were obtained. Bilateral screening digital breast tomosynthesis was performed. The images were evaluated with computer-aided detection. Standard and/or implant displaced views were performed.  COMPARISON:  Previous exam(s).  ACR Breast Density Category b: There are scattered areas of fibroglandular density.  FINDINGS: The patient has retropectoral implants. There are no findings suspicious for malignancy.  IMPRESSION: No mammographic evidence of malignancy. A result letter of this screening mammogram will be mailed directly to the patient.  RECOMMENDATION: Screening mammogram in one year. (Code:SM-B-01Y)  BI-RADS CATEGORY  1:  Negative.  Electronically Signed   By: Nolon Nations M.D.   On: 12/23/2021 16:34 DG Bone Density EXAM: DUAL X-RAY ABSORPTIOMETRY (DXA) FOR BONE MINERAL DENSITY  IMPRESSION: Your patient Gabrielle Pineda completed a BMD test on 12/23/2021 using the [employer] (software version: 14.10) manufactured by [employer]. The following summarizes the results of our evaluation. Technologist: SCE/MM PATIENT BIOGRAPHICAL: Name: Gabrielle Pineda, Gabrielle Pineda Patient ID: [id number] Birth Date: 1956-04-09 Height: 66.0 in. Gender: Female Exam Date: 12/23/2021 Weight: 178.2 lbs. Indications: COPD, History of Fracture (Adult), neuropathy, Osteoarthritis, Postmenopausal, Previous Smoker, Rheumatoid Arthritis Fractures: Left wrist Treatments: Albuterol,  Atrovent, Breztri, Gabapentin, meloxicam, Multi-Vitamin, Prilosec, Singulair  DENSITOMETRY RESULTS: Site          Region     Measured Date Measured Age WHO Classification Young Adult T-score BMD         %Change vs. Previous Significant Change (*) AP Spine L3-L4 12/23/2021  65.3 Osteopenia -1.2 1.070 g/cm2 - -  Left Femur Total 12/23/2021 65.3 Osteopenia -1.3 0.844 g/cm2 - -  Right Forearm Radius 33% 12/23/2021 65.3 Osteopenia -1.3 0.766 g/cm2 - -  ASSESSMENT: The BMD measured at Forearm Radius 33% is 0.766 g/cm2 with a T-score of -1.3. This patient is considered osteopenic according to [facility]) criteria. L1 and L2 was excluded due to degenerative changes. The scan quality is good. Right femur was excluded due to surgical hardware.  World Pharmacologist [facility]) criteria for post-menopausal, Caucasian Women: Normal:                   T-score at or above -1 SD Osteopenia/low bone mass: T-score between -1 and -2.5 SD Osteoporosis:             T-score at or below -2.5 SD  RECOMMENDATIONS: 1. All patients should optimize calcium and vitamin D intake. 2. Consider FDA-approved medical therapies in postmenopausal women and men aged 60 years and older, based on the following: a. A hip or vertebral(clinical or morphometric) fracture b. T-score < -2.5 at the femoral neck or spine after appropriate evaluation to exclude secondary causes c. Low bone mass (T-score between -1.0 and -2.5 at the femoral neck or spine) and a 10-year probability of a hip fracture > 3% or a 10-year probability of a major osteoporosis-related fracture > 20% based on the [place]-adapted WHO algorithm 3. Clinician judgment and/or patient preferences may indicate treatment for people with 10-year fracture probabilities above or below these levels  FOLLOW-UP: People with diagnosed cases of osteoporosis or at high risk for fracture should have regular bone mineral density tests. For patients  eligible for Medicare, routine testing is allowed once every 2 years. The testing frequency can be increased to one year for patients who have rapidly progressing disease, those who are receiving or discontinuing medical therapy to restore bone mass, or have additional risk factors.  I have reviewed this report, and agree with the above findings. Mark A. Thornton Papas, M.D. [facility]  Dear Gwyneth Sprout,  Your patient Gabrielle Pineda completed a FRAX assessment on 12/23/2021 using the [employer] (analysis version: 14.10) manufactured by [employer]. The following summarizes the results of our evaluation.  PATIENT BIOGRAPHICAL: Name: Gabrielle Pineda, Gabrielle Pineda Patient ID: [id number] Birth Date: Nov 03, 1955 Height:    66.0 in. Gender:     Female    [age]       Weight:    178.2 lbs. Ethnicity:  White                            Exam Date: 12/23/2021  FRAX* RESULTS:  (version: 3.5) 10-year Probability of Fracture1 Major Osteoporotic Fracture2 Hip Fracture 17.6% 1.8% Population: [place] (Caucasian) Risk Factors: History of Fracture (Adult), Rheumatoid Arthritis  Based on Femur (Left) Neck BMD  1 -The 10-year probability of fracture may be lower than reported if the patient has received treatment. 2 -Major Osteoporotic Fracture: Clinical Spine, Forearm, Hip or Shoulder  *FRAX is a Materials engineer of the [employer] for Metabolic Bone Disease, a [place] (WHO) [facility].  ASSESSMENT: The probability of a major osteoporotic fracture is 17.6% within the next ten years. The probability of a hip fracture is 1.8% within the next ten years.  I have reviewed this report and agree with the above findings.  Mark A. Thornton Papas, M.D.  [facility]  Electronically Signed   By: Lavonia Dana M.D.   On: 12/23/2021 13:59 Note: Reviewed        Physical Exam  General appearance: Well nourished, well  developed, and well hydrated. In no apparent acute distress Mental status: Alert, oriented x 3 (person, place, & time)       Respiratory: No evidence of acute respiratory distress Eyes: PERLA Vitals: BP (!) 153/91   Pulse 79   Temp (!) 96.4 F (35.8 C) (Temporal)   Resp 18   Ht _0  (1.753 m)   Wt 170 lb (77.1 kg)   SpO2 94%   BMI 25.10 kg/m  BMI: Estimated body mass index is 25.1 kg/m as calculated from the following:   Height as of this encounter: _1  (1.753 m).   Weight as of this encounter: 170 lb (77.1 kg). Ideal: Ideal body weight: 66.2 kg (145 lb 15.1 oz) Adjusted ideal body weight: 70.6 kg (155 lb 9.1 oz)    Lumbar Spine Area Exam  Skin & Axial Inspection: Well healed scar from previous spine surgery detected Alignment: Symmetrical Functional ROM: Pain restricted ROM affecting both sides  Gait & Posture Assessment  Ambulation: Limited Gait: Antalgic Posture: Difficulty standing up straight, due to pain  Lower Extremity Exam      Side: Right lower extremity   Side: Left lower extremity  Stability: No instability observed           Stability: No instability observed          Skin & Extremity Inspection: Skin color, temperature, and hair growth are WNL. No peripheral edema or cyanosis. No masses, redness, swelling, asymmetry, or associated skin lesions. No contractures.   Skin & Extremity Inspection: Skin color, temperature, and hair growth are WNL. No peripheral edema or cyanosis. No masses, redness, swelling, asymmetry, or associated skin lesions. No contractures.  Functional ROM: Pain restricted ROM for hip and knee joints Limited SLR (straight leg raise)   Functional ROM: Pain restricted ROM for hip and knee joints Limited SLR (straight leg raise)  Muscle Tone/Strength: Functionally intact. No obvious neuro-muscular anomalies detected.   Muscle Tone/Strength: Functionally intact. No obvious neuro-muscular anomalies detected.  Sensory (Neurological): Dermatomal pain  pattern         Sensory (Neurological): Dermatomal pain pattern        DTR: Patellar: deferred today Achilles: deferred today Plantar: deferred today   DTR: Patellar: deferred today Achilles: deferred today Plantar: deferred today  Palpation: No palpable anomalies   Palpation: No palpable anomalies     Assessment   Status Diagnosis  Controlled Controlled Controlled 1. Spondylosis of cervical region without myelopathy or radiculopathy   2. Neuropathy   3. Failed back surgical syndrome   4. Postlaminectomy syndrome, lumbar region   5. DDD (degenerative disc disease), cervical   6. Lumbar radiculopathy (R>L)   7. History of right hip replacement   8. Chronic pain syndrome        Plan of Care  Problem-specific:  No problem-specific Assessment & Plan notes found for this encounter.  Gabrielle Pineda has a current medication list which includes the following long-term medication(s): albuterol, aripiprazole, atorvastatin, carvedilol, ezetimibe, gabapentin, ipratropium, linzess, mirtazapine, montelukast, omeprazole, potassium chloride sa, topiramate, and venlafaxine xr.  Pharmacotherapy (Medications Ordered): Meds ordered this encounter  Medications   oxyCODONE-acetaminophen (PERCOCET) 5-325 MG tablet    Sig: Take 1 tablet by mouth every 8 (eight) hours as needed for severe  pain. Must last 30 days.    Dispense:  90 tablet    Refill:  0    Chronic Pain: STOP Act (Not applicable) Fill 1 day early if closed on refill date. Avoid benzodiazepines within 8 hours of opioids   oxyCODONE-acetaminophen (PERCOCET) 5-325 MG tablet    Sig: Take 1 tablet by mouth every 8 (eight) hours as needed for severe pain. Must last 30 days.    Dispense:  90 tablet    Refill:  0    Chronic Pain: STOP Act (Not applicable) Fill 1 day early if closed on refill date. Avoid benzodiazepines within 8 hours of opioids  Continue gabapentin and Topamax as prescribed, no refills needed Continue Mobic as  needed for acute on chronic pain flare Continue care with psychiatry    Follow-up plan:   Return in about 3 months (around 04/17/2022) for Medication Management, in person.    Recent Visits Date Type Provider Dept  10/21/21 Office Visit [name], MD [facility]  Showing recent visits within past 90 days and meeting all other requirements Today's Visits Date Type Provider Dept  01/15/22 Office Visit [name], MD [facility]  Showing today's visits and meeting all other requirements Future Appointments Date Type Provider Dept  04/07/22 Appointment [name], MD [facility]  Showing future appointments within next 90 days and meeting all other requirements  I discussed the assessment and treatment plan with the patient. The patient was provided an opportunity to ask questions and all were answered. The patient agreed with the plan and demonstrated an understanding of the instructions.  Patient advised to call back or seek an in-person evaluation if the symptoms or condition worsens.  Duration of encounter: 49mnutes.  Note by: BGillis Santa MD Date: 01/15/2022; Time: 9:16 AM

## 2022-01-15 NOTE — Progress Notes (Signed)
Nursing Pain Medication Assessment:  Safety precautions to be maintained throughout the outpatient stay will include: orient to surroundings, keep bed in low position, maintain call bell within reach at all times, provide assistance with transfer out of bed and ambulation.  Medication Inspection Compliance: Pill count conducted under aseptic conditions, in front of the patient. Neither the pills nor the bottle was removed from the patient's sight at any time. Once count was completed pills were immediately returned to the patient in their original bottle.  Medication: Oxycodone/APAP Pill/Patch Count:  6 of 90 pills remain Pill/Patch Appearance: Markings consistent with prescribed medication Bottle Appearance: Standard pharmacy container. Clearly labeled. Filled Date: 04 / 11 / 2023 Last Medication intake:  Today. Safety precautions to be maintained throughout the outpatient stay will include: orient to surroundings, keep bed in low position, maintain call bell within reach at all times, provide assistance with transfer out of bed and ambulation.

## 2022-01-19 ENCOUNTER — Ambulatory Visit: Payer: Medicare Other

## 2022-01-19 DIAGNOSIS — I10 Essential (primary) hypertension: Secondary | ICD-10-CM

## 2022-01-19 DIAGNOSIS — J449 Chronic obstructive pulmonary disease, unspecified: Secondary | ICD-10-CM

## 2022-01-19 NOTE — Progress Notes (Signed)
Chronic Care Management Pharmacy Note  01/27/2022 Name:  Gabrielle Pineda MRN:  [id number] DOB:  [birth year]  Summary: Patient presents for CCM follow-up.   Recommendations/Changes made from today's visit: -Recommend [id number] units of vitamin D daily.  -Recommend 1200 mg of calcium daily from dietary and supplemental sources.  Plan: CPP follow-up in 6 months   Recommended Problem List Changes:  Add: Osteopenia of multiple sites    Subjective: Gabrielle Pineda is an 66 y.o. year old female who is a primary patient of Lavon Paganini MD.  The CCM team was consulted for assistance with disease management and care coordination needs.    Engaged with patient by telephone for follow up visit in response to provider referral for pharmacy case management and/or care coordination services.   Consent to Services:  The patient was given information about Chronic Care Management services, agreed to services, and gave verbal consent prior to initiation of services.  Please see initial visit note for detailed documentation.   Patient Care Team: Curran as PCP - General Rockey Situ, Kathlene November, MD as PCP - Cardiology (Cardiology) Manya Silvas, MD (Inactive) (Gastroenterology) Germaine Pomfret, [facility] (Pharmacist)  Recent office visits: 07/04/21: Patient presented to Tally Joe, FNP for sinusitis. 06/26/21: Patient presented to Tally Joe, FNP for sinusitis.  Recent consult visits: 09/11/21: Patient presented to Dr. Jefm Bryant (Rheumatology) for follow-up. Consider increasing Arava to 20 mg daily  02/06/21: Patient presented to Dr. Jefm Bryant (Rheumatology) for follow-up. No medication changes noted.  11/20/20: Patient presented to Laurann Montana, NP (Cardiology) for follow-up.   Hospital visits: 04/15/21: Patient hospitalized for hip surgery. Lovenox 40 mg daily. Ibuprofen, Meloxicam stopped.  Admitted to the hospital on 08/018/2022 due to Thoracic Spinal Cord Stimulator  & Pulse generator Placement. Discharge date was 03/17/2021. Discharged from [facility]  Objective:  Lab Results  Component Value Date   CREATININE 0.99 11/17/2021   BUN 5 (L) 11/17/2021   GFRNONAA >60 04/16/2021   GFRAA 72 05/06/2020   NA 142 11/17/2021   K 4.7 11/17/2021   CALCIUM 9.4 11/17/2021   CO2 20 11/17/2021   GLUCOSE 96 11/17/2021    Lab Results  Component Value Date/Time   HGBA1C 5.3 06/26/2021 09:13 AM   HGBA1C 5.0 04/14/2018 11:24 AM    Last diabetic Eye exam: No results found for: "HMDIABEYEEXA"  Last diabetic Foot exam: No results found for: "HMDIABFOOTEX"   Lab Results  Component Value Date   CHOL 153 11/17/2021   HDL 40 11/17/2021   LDLCALC 79 11/17/2021   TRIG 202 (H) 11/17/2021   CHOLHDL 3.8 11/17/2021       Latest Ref Rng & Units 11/17/2021    9:59 AM 04/04/2021   11:15 AM 05/06/2020    1:56 PM  Hepatic Function  Total Protein 6.0 - 8.5 g/dL 6.6  7.0    Albumin 3.8 - 4.8 g/dL 4.2  3.4  4.2   AST 0 - 40 IU/L 21  19    ALT 0 - 32 IU/L 18  18    Alk Phosphatase 44 - 121 IU/L 173  186    Total Bilirubin 0.0 - 1.2 mg/dL <0.2  0.3      Lab Results  Component Value Date/Time   TSH 2.170 11/17/2021 09:59 AM   TSH 1.310 04/17/2019 10:37 AM   FREET4 0.82 11/17/2021 09:59 AM       Latest Ref Rng & Units 11/17/2021    9:59 AM 06/26/2021  9:13 AM 04/18/2021    8:19 AM  CBC  WBC 3.4 - 10.8 x10E3/uL 5.9  3.4  7.4   Hemoglobin 11.1 - 15.9 g/dL 10.9  10.0  8.0   Hematocrit 34.0 - 46.6 % 33.3  30.7  25.1   Platelets 150 - 450 x10E3/uL 309  172  181     No results found for: "VD25OH"  Clinical ASCVD: No  The 10-year ASCVD risk score (Arnett DK, et al., 2019) is: 16%   Values used to calculate the score:     Age: 66 years     Sex: Female     Is Non-Hispanic African American: No     Diabetic: No     Tobacco smoker: Yes     Systolic Blood Pressure: 106 mmHg     Is BP treated: Yes     HDL Cholesterol: 40 mg/dL      Total Cholesterol: 153 mg/dL       01/22/2022    1:26 PM 01/15/2022    8:27 AM 12/26/2021    1:08 PM  Depression screen PHQ 2/9  Decreased Interest 0 0 0  Down, Depressed, Hopeless 0 0 0  PHQ - 2 Score 0 0 0  Altered sleeping 1  3  Tired, decreased energy 1  0  Change in appetite 0  0  Feeling bad or failure about yourself  0  0  Trouble concentrating 0  0  Suicidal thoughts 0  0  PHQ-9 Score 2  3  Difficult doing work/chores   Not difficult at all    Social History   Tobacco Use  Smoking Status Former   Packs/day: 2.00   Years: 40.00   Total pack years: 80.00   Types: Cigarettes   Quit date: 10/30/2020   Years since quitting: 1.2  Smokeless Tobacco Never   BP Readings from Last 3 Encounters:  01/22/22 (!) 141/84  01/15/22 (!) 153/91  12/26/21 128/86   Pulse Readings from Last 3 Encounters:  01/22/22 79  01/15/22 79  12/26/21 79   Wt Readings from Last 3 Encounters:  01/22/22 180 lb 9.6 oz (81.9 kg)  01/15/22 170 lb (77.1 kg)  12/26/21 181 lb 3.2 oz (82.2 kg)   BMI Readings from Last 3 Encounters:  01/22/22 26.67 kg/m  01/15/22 25.10 kg/m  12/26/21 26.76 kg/m    Assessment/Interventions: Review of patient past medical history, allergies, medications, health status, including review of consultants reports, laboratory and other test data, was performed as part of comprehensive evaluation and provision of chronic care management services.   SDOH:  (Social Determinants of Health) assessments and interventions performed: Yes       SDOH Screenings   Alcohol Screen: Low Risk  (11/17/2021)   Alcohol Screen    Last Alcohol Screening Score (AUDIT): 0  Depression (PHQ2-9): Low Risk  (01/22/2022)   Depression (PHQ2-9)    PHQ-2 Score: 2  Recent Concern: Depression (PHQ2-9) - Medium Risk (11/17/2021)   Depression (PHQ2-9)    PHQ-2 Score: 6  Financial Resource Strain: Low Risk  (11/03/2021)   Overall Financial Resource Strain (CARDIA)    Difficulty of Paying Living  Expenses: Not very hard  Food Insecurity: No Food Insecurity (11/03/2021)   Hunger Vital Sign    Worried About Running Out of Food in the Last Year: Never true    Ran Out of Food in the Last Year: Never true  Housing: Low Risk  (11/03/2021)   Housing    Last Housing Risk Score:  0  Physical Activity: Inactive (11/03/2021)   Exercise Vital Sign    Days of Exercise per Week: 0 days    Minutes of Exercise per Session: 0 min  Social Connections: Moderately Isolated (11/03/2021)   Social Connection and Isolation Panel [NHANES]    Frequency of Communication with Friends and Family: More than three times a week    Frequency of Social Gatherings with Friends and Family: More than three times a week    Attends Religious Services: Never    Marine scientist or Organizations: No    Attends Archivist Meetings: Never    Marital Status: Married  Stress: Stress Concern Present (11/03/2021)   [employer] [place]    Feeling of Stress : To some extent  Tobacco Use: Medium Risk (01/22/2022)   Patient History    Smoking Tobacco Use: Former    Smokeless Tobacco Use: Never    Passive Exposure: Not on file  Transportation Needs: No Transportation Needs (11/03/2021)   PRAPARE - Hydrologist (Medical): No    Lack of Transportation (Non-Medical): No    CCM Care Plan  Allergies  Allergen Reactions   Plaquenil [Hydroxychloroquine] Rash    Medications Reviewed Today     Reviewed by Ursula Alert, MD (Psychiatrist) on 01/22/22 at 1345  Med List Status: <None>   Medication Order Taking? Sig Documenting Provider Last Dose Status Informant  acyclovir ointment (ZOVIRAX) 5 % [id number] Yes SMARTSIG:Topical Every 3 Hours [provider] Taking Active   albuterol (VENTOLIN HFA) 108 (90 Base) MCG/ACT inhaler [id number] Yes INHALE TWO PUFFS BY MOUTH INTO LUNGS every SIX hours AS NEEDED FOR WHEEZING AND/OR  SHORTNESS OF Mellody Drown, FNP Taking Active   ARIPiprazole (ABILIFY) 10 MG tablet [id number] Yes TAKE ONE TABLET BY MOUTH ONCE DAILY Gwyneth Sprout, FNP Taking Active   atorvastatin (LIPITOR) 40 MG tablet [id number] Yes Take 1 tablet (40 mg total) by mouth every morning. Minna Merritts, MD Taking Active   Budeson-Glycopyrrol-Formoterol (BREZTRI AEROSPHERE) 160-9-4.8 MCG/ACT Hollie Salk [id number] Yes Inhale 2 puffs into the lungs in the morning and at bedtime. Tyler Pita, MD Taking Active   carvedilol (COREG) 12.5 MG tablet [id number] Yes TAKE ONE TABLET BY MOUTH EVERY MORNING and TAKE ONE TABLET BY MOUTH EVERYDAY AT BEDTIME Gollan, Kathlene November, MD Taking Active   ezetimibe (ZETIA) 10 MG tablet [id number] Yes Take 1 tablet (10 mg total) by mouth every morning. Minna Merritts, MD Taking Active   gabapentin (NEURONTIN) 400 MG capsule [id number] Yes Take 1 capsule (400 mg total) by mouth 2 (two) times daily. [name], MD Taking Active   ipratropium (ATROVENT) 0.02 % nebulizer solution [id number] Yes Take 2.5 mLs (0.5 mg total) by nebulization 4 (four) times daily as needed for wheezing or shortness of breath. Gwyneth Sprout, FNP Taking Active   leflunomide (ARAVA) 20 MG tablet [id number] Yes Take 20 mg by mouth at bedtime. [provider] Taking Active   LINZESS 145 MCG CAPS capsule [id number] Yes Take 145 mcg by mouth daily before breakfast. [provider] Taking Active Self  meloxicam (MOBIC) 15 MG tablet [id number] Yes Take 15 mg by mouth at bedtime. [provider] Taking Active   mirtazapine (REMERON) 7.5 MG tablet [id number] Yes Take 1 tablet (7.5 mg total) by mouth at bedtime. Ursula Alert, MD Taking Active   montelukast (SINGULAIR) 10 MG tablet [id number] Yes Take 1 tablet (10 mg total)  by mouth at bedtime. Gwyneth Sprout, FNP Taking Active   Multiple Vitamin (MULTIVITAMIN WITH MINERALS) TABS tablet [id number] Yes Take 1 tablet by mouth daily. [provider] Taking Active Self  Nutritional Supplements (ESTROVEN ENERGY PO) [id number] Yes Take 1 tablet by mouth daily.  [provider] Taking Active Self  omeprazole (PRILOSEC) 40 MG capsule [id number] Yes Take 1 capsule (40 mg total) by mouth 2 (two) times daily. Gwyneth Sprout, FNP Taking Active   oxyCODONE-acetaminophen (PERCOCET) 5-325 MG tablet [id number] Yes Take 1 tablet by mouth every 8 (eight) hours as needed for severe pain. Must last 30 days. [name], MD Taking Active   oxyCODONE-acetaminophen (PERCOCET) 5-325 MG tablet [id number] Yes Take 1 tablet by mouth every 8 (eight) hours as needed for severe pain. Must last 30 days. [name], MD Taking Active   potassium chloride SA (KLOR-CON M) 20 MEQ tablet [id number] Yes Take 1 tablet (20 mEq total) by mouth daily with breakfast. Gwyneth Sprout, FNP Taking Active   topiramate (TOPAMAX) 25 MG tablet [id number] Yes Take 1 tablet (25 mg total) by mouth 2 (two) times daily. [name], MD Taking Active   valACYclovir (VALTREX) 1000 MG tablet [id number] Yes TAKE TWO TABLETS BY MOUTH twice daily FOR 5 DAYS AS NEEDED FOR FEVER blisters Gwyneth Sprout, FNP Taking Active   venlafaxine XR (EFFEXOR-XR) 75 MG 24 hr capsule [id number] Yes Take 3 capsules (225 mg total) by mouth daily with breakfast. TAKE 3 CAPSULES BY MOUTH ONCE DAILY WITH BREAKFAST Gwyneth Sprout, FNP Taking Active   XIIDRA 5 % SOLN [id number] Yes Place 1 drop into both eyes daily. [provider] Taking Active Self  Med List Note Dewayne Shorter, RN 01/15/22 7494): UDS 08/23/21 Mr 04-19-2022            Patient Active Problem List   Diagnosis Date Noted   Clavi 01/22/2022   GAD (generalized anxiety disorder) 12/26/2021   Alcohol use disorder, moderate, in sustained remission ([place]) 12/26/2021   Need for vaccination against Streptococcus pneumoniae 11/17/2021   Sleep disorder 10/23/2021   Iron deficiency anemia 06/26/2021   Depression, major, single  episode, moderate ([place]) 06/26/2021   Status post total hip replacement, right 04/15/2021   Chronic radicular lumbar pain 10/03/2020   Postlaminectomy syndrome, lumbar region 06/03/2020   Moderate episode of recurrent major depressive disorder ([place]) 12/04/2019   Spondylosis of cervical region without myelopathy or radiculopathy 09/21/2019   DDD (degenerative disc disease), cervical 09/21/2019   Cervicalgia 09/21/2019   Cervical fusion syndrome 09/21/2019   Chronic pain syndrome 09/21/2019   Spinal stenosis, lumbar region, with neurogenic claudication 08/15/2019   History of adenomatous polyp of colon 11/04/2017   Cervical radiculopathy 08/02/2017   Neuropathy 08/02/2017   Essential hypertension 07/28/2017   Closed compression fracture of L5 lumbar vertebra 07/07/2017   Sacral insufficiency fracture with routine healing 07/07/2017   COPD exacerbation ([place]) 04/10/2015   Family history of malignant neoplasm of pancreas 04/03/2015   Hypercholesteremia 04/03/2015   Disorder of iron metabolism 04/03/2015   Carpal tunnel syndrome 03/29/2015   Acid reflux 10/11/2014   Closed fracture of distal phalanx of thumb 08/15/2014   Arthritis, degenerative 01/30/2014   Arthritis or polyarthritis, rheumatoid ([place]) 01/30/2014   Paroxysmal supraventricular tachycardia ([place]) 09/22/2010   B-complex deficiency 09/09/2007   CN (constipation) 06/26/2007   Clinical depression 06/26/2007   Cold sore 06/26/2007   H/O alcohol abuse 06/26/2007   Cannot sleep 06/26/2007   Localized osteoarthrosis, hand  06/26/2007   Menopausal symptom 06/26/2007    Immunization History  Administered Date(s) Administered   Fluad Quad(high Dose 65+) 10/01/2021   Influenza Split 09/07/2012   Influenza,inj,Quad PF,6+ Mos 11/18/2015, 07/23/2016, 07/02/2017, 04/14/2018, 04/17/2019, 04/19/2020   Influenza-Unspecified 09/24/2014   PFIZER(Purple Top)SARS-COV-2 Vaccination 11/21/2019, 12/12/2019   PNEUMOCOCCAL CONJUGATE-20  11/17/2021   Pneumococcal Polysaccharide-23 02/12/2011, 09/24/2014, 04/19/2020   Tdap 02/12/2011, 11/15/2015    Conditions to be addressed/monitored:  Hypertension, Hyperlipidemia, COPD, Depression, Tobacco use, and Insomnia, Chronic Pain, and Rheumatoid Arthritis  Care Plan : General Pharmacy (Adult)  Updates made by Germaine Pomfret, RPH since 01/27/2022 12:00 AM     Problem: Hypertension, Hyperlipidemia, COPD, Depression, Tobacco use and Chronic Pain, and Rheumatoid Arthritis   Priority: High     Long-Range Goal: Patient-Specific Goal   Start Date: 11/25/2020  Expected End Date: 03/28/2022  This Visit's Progress: On track  Recent Progress: On track  Priority: High  Note:   Current Barriers:  Unable to independently afford treatment regimen Unable to achieve control of COPD  Suboptimal therapeutic regimen for depression  Pharmacist Clinical Goal(s):  Patient will verbalize ability to afford treatment regimen achieve control of COPD as evidenced by stable breathing, avoiding cigarette use, and absence of exacerbations adhere to plan to optimize therapeutic regimen for depression as evidenced by report of adherence to recommended medication management changes through collaboration with PharmD and provider.   Interventions: 1:1 collaboration with Tally Joe, FNP regarding development and update of comprehensive plan of care as evidenced by provider attestation and co-signature Inter-disciplinary care team collaboration (see longitudinal plan of care) Comprehensive medication review performed; medication list updated in electronic medical record  Hypertension (BP goal <140/90) -Uncontrolled -Current treatment: Carvedilol 12.5 mg twice daily  Furosemide 20 mg daily as needed  -Medications previously tried: Diltiazem,   -Current home readings:   124/83  161/77 -Denies hypotensive/hypertensive symptoms -Dietary Habits: Picky eater, doesn't like many vegetables. Rarely  cooks at home, but sister will cook for her. Denies caffeine intake.   Supper: Steak + Potatoes + Corn OR Mac and Cheese  -Recommended to continue current medication for now.  Hyperlipidemia: (LDL goal < 70) -Controlled -Current treatment: Atorvastatin 40 mg daily  Ezetimibe 10 mg daily  -Medications previously tried: Pravastatin, Rosuvastatin  -Educated on Benefits of statin for ASCVD risk reduction; -Recommended to continue current medication  COPD (Goal: control symptoms and prevent exacerbations) -Controlled -Current treatment  Ipratropium 0.02% nebulizer 2.5 mL every 4 hours Albuterol 2 puffs every 6 hours as needed  Breztri 2 puffs twice daily  -Medications previously tried: NA  -Gold Grade: Gold 2 (FEV1 50-79%) -Current COPD Classification:  B (high sx, <2 exacerbations/yr) -MMRC/CAT score: NA -Pulmonary function testing: 71% (2017) -Exacerbations requiring treatment in last 6 months: yes hospitalized Mar 2022 -Frequency of rescue inhaler use: 1-2 times daily   -Recommended to continue current medication  Depression/Anxiety (Goal: Maintain symptom remission) -Controlled -Current treatment: Aripiprazole 10 mg daily  Mirtazapine 7.5 mg nightly  Venlafaxine XR 75 mg 3 capsules daily -Medications previously tried/failed: Sertraline, Buspar  -PHQ9: 6 -GAD7: 9 -Able to fall asleep within 15-20 minutes, wakes up 4-5 times nightly. 5-6 hours of sleep nightly.  -Counseled on proper sleep hygiene.  -Continue current medications   Osteopenia (Goal Prevent fractures) -Not ideally controlled -Last DEXA Scan: 12/23/21   T-Score femoral neck: NA  T-Score total hip: -1.3  T-Score lumbar spine: -1.2  T-Score forearm radius: -1.3  10-year probability of major osteoporotic fracture: 17.6%  10-year  probability of hip fracture: 1.8% -Patient is not a candidate for pharmacologic treatment -Current treatment  None -Medications previously tried: NA  -Recommend [phone] units of  vitamin D daily. Recommend 1200 mg of calcium daily from dietary and supplemental sources.   Patient Goals/Self-Care Activities Patient will:  - check blood pressure weekly, document, and provide at future appointments Utilize maintenance inhaler daily  Follow Up Plan: Telephone follow up appointment with care management team member scheduled for:  07/20/2022 at 11:00 AM     Medication Assistance:  Application for Trelegy denied  Patient's preferred pharmacy is:  [facility] - [place], [place] - [address] [place] [address] Phone: [phone] Fax: [phone]  [facility] [place] [facility] [place] - [place])  [address] Phone: [phone] Fax: [phone]   Patient decided to: Utilize [facility] for medication synchronization, packaging and delivery   Care Plan and Follow Up Patient Decision:  Patient agrees to Care Plan and Follow-up.  Plan: Telephone follow up appointment with care management team member scheduled for:  07/20/2022 at 11:00 AM  Junius Argyle, PharmD, [place], [place] [phone]

## 2022-01-20 ENCOUNTER — Other Ambulatory Visit: Payer: Self-pay | Admitting: Family Medicine

## 2022-01-20 DIAGNOSIS — B001 Herpesviral vesicular dermatitis: Secondary | ICD-10-CM

## 2022-01-22 ENCOUNTER — Encounter: Payer: Self-pay | Admitting: Psychiatry

## 2022-01-22 ENCOUNTER — Ambulatory Visit (INDEPENDENT_AMBULATORY_CARE_PROVIDER_SITE_OTHER): Payer: Medicare Other | Admitting: Psychiatry

## 2022-01-22 ENCOUNTER — Encounter: Payer: Self-pay | Admitting: Podiatry

## 2022-01-22 ENCOUNTER — Ambulatory Visit: Payer: Medicare Other | Admitting: Podiatry

## 2022-01-22 VITALS — BP 141/84 | HR 79 | Temp 98.5°F | Wt 180.6 lb

## 2022-01-22 DIAGNOSIS — F3342 Major depressive disorder, recurrent, in full remission: Secondary | ICD-10-CM | POA: Diagnosis not present

## 2022-01-22 DIAGNOSIS — F411 Generalized anxiety disorder: Secondary | ICD-10-CM

## 2022-01-22 DIAGNOSIS — L989 Disorder of the skin and subcutaneous tissue, unspecified: Secondary | ICD-10-CM | POA: Diagnosis not present

## 2022-01-22 DIAGNOSIS — F5101 Primary insomnia: Secondary | ICD-10-CM

## 2022-01-22 DIAGNOSIS — M2041 Other hammer toe(s) (acquired), right foot: Secondary | ICD-10-CM

## 2022-01-22 DIAGNOSIS — M79674 Pain in right toe(s): Secondary | ICD-10-CM | POA: Diagnosis not present

## 2022-01-22 DIAGNOSIS — M79675 Pain in left toe(s): Secondary | ICD-10-CM | POA: Diagnosis not present

## 2022-01-22 DIAGNOSIS — B351 Tinea unguium: Secondary | ICD-10-CM | POA: Diagnosis not present

## 2022-01-22 DIAGNOSIS — F1021 Alcohol dependence, in remission: Secondary | ICD-10-CM | POA: Diagnosis not present

## 2022-01-22 DIAGNOSIS — L84 Corns and callosities: Secondary | ICD-10-CM | POA: Diagnosis not present

## 2022-01-22 NOTE — Progress Notes (Signed)
[facility] [facility] MD OP Progress Note  01/22/2022 6:24 PM Gabrielle Pineda  MRN:  [id number]  Chief Complaint:  Chief Complaint  Patient presents with   Follow-up: 66 year old Caucasian female with history of depression, anxiety, chronic pain, sleep problems, presented for medication management.   HPI: Gabrielle Pineda is a 66 year old Caucasian female, married, on disability, lives in [place], has a history of depression, sleep problem, COPD, gastroesophageal reflux disease, hypertension, heart murmur, history of back pain with history of spinal cord stimulator, paroxysmal supraventricular tachycardia, hypercholesterolemia, chronic pain syndrome, presented for follow-up appointment.  Patient today reports she is tolerating the mirtazapine well.  She currently sleeps around 5 to 6 hours at night.  Goes to bed at around 11 PM.  Watches TV prior to that.  Reports she is able to fall asleep immediately.  She however would like to stay in bed longer.  She does have pain which is currently managed on the current medications.  Patient denies any significant depression or anxiety symptoms.  Currently compliant on Abilify, venlafaxine.  Denies side effects.  Denies suicidality, homicidality or perceptual disturbances.  Patient denies any other concerns today.  Visit Diagnosis:    ICD-10-CM   1. Primary insomnia  F51.01     2. MDD (major depressive disorder), recurrent, in full remission ([place])  F33.42     3. GAD (generalized anxiety disorder)  F41.1     4. Alcohol use disorder, moderate, in sustained remission ([place])  F10.21       Past Psychiatric History: Reviewed past psychiatric history from progress note on 12/26/2021.  Past trials of medications like Prozac, sertraline, multiple other medications.  Does not remember all the names.  Past Medical History:  Past Medical History:  Diagnosis Date   Alcohol abuse    Anemia    Anxiety    Arthritis    Cervicalgia    Cirrhosis ([place]) 1994   COPD  (chronic obstructive pulmonary disease) ([place])    Depression    Difficult intubation    has plates and screws in neck   Dyspnea    GERD (gastroesophageal reflux disease)    Headache    Heart murmur    on heard when pt is lying   Hypertension    Other and unspecified hyperlipidemia    Status post insertion of spinal cord stimulator    Tachycardia    d/t questionable anxiety happens every 5- 10 years, sees [facility]    Past Surgical History:  Procedure Laterality Date   ANTERIOR CERVICAL DECOMP/DISCECTOMY FUSION  2012, 2015, 2018   x3   AUGMENTATION MAMMAPLASTY Bilateral [place] RELEASE  11/11/2011   Procedure: CARPAL TUNNEL RELEASE;  Surgeon: Floyce Stakes, MD;  Location: [facility]UR[facility]S;  Service: Neurosurgery;  Laterality: Right;  Right Median Nerve Decompression   CARPAL TUNNEL RELEASE  2013   COLONOSCOPY WITH PROPOFOL N/A 11/01/2017   Procedure: COLONOSCOPY WITH PROPOFOL;  Surgeon: Manya Silvas, MD;  Location: [facility] ENDOSCOPY;  Service: Endoscopy;  Laterality: N/A;   ESOPHAGOGASTRODUODENOSCOPY     LUMBAR LAMINECTOMY/DECOMPRESSION MICRODISCECTOMY N/A 08/15/2019   Procedure: Lumbar three to Sacral one Decompressive lumbar laminectomy;  Surgeon: Erline Levine, MD;  Location: [facility];  Service: Neurosurgery;  Laterality: N/A;   neck disc surgery     plates and screws in neck x 2   ROTATOR CUFF REPAIR  06/16/2016   right shoulder    ROTATOR CUFF REPAIR  SPINAL CORD STIMULATOR INSERTION N/A 03/17/2021   Procedure: THORACIC SPINAL CORD STIMULATOR (PERCUTANEOUS) & PULSE GENERATOR PLACEMENT;  Surgeon: [name], MD;  Location: [facility] ORS;  Service: Neurosurgery;  Laterality: N/A;   TONSILLECTOMY AND ADENOIDECTOMY  1973    TOTAL HIP ARTHROPLASTY Right 04/15/2021   Procedure: TOTAL HIP ARTHROPLASTY ANTERIOR APPROACH;  Surgeon: Hessie Knows, MD;  Location: [facility] ORS;  Service: Orthopedics;  Laterality: Right;    Family  Psychiatric History: Reviewed family psychiatric history from progress note on 12/26/2021.  Family History:  Family History  Problem Relation Age of Onset   Alcohol abuse Mother    Bipolar disorder Mother    Suicidality Mother    Pancreatic cancer Father    Alcohol abuse Father    Drug abuse Sister    Alcohol abuse Sister    Depression Sister    Anesthesia problems Neg Hx    Breast cancer Neg Hx     Social History: Reviewed social history from progress note on 12/26/2021. Social History   Socioeconomic History   Marital status: Married    Spouse name: jerry   Number of children: 0   Years of education: Not on file   Highest education level: Bachelor's degree (e.g., BA, AB, BS)  Occupational History   Not on file  Tobacco Use   Smoking status: Former    Packs/day: 2.00    Years: 40.00    Total pack years: 80.00    Types: Cigarettes    Quit date: 10/30/2020    Years since quitting: 1.2   Smokeless tobacco: Never  Vaping Use   Vaping Use: Former   Devices: vape occasionally  Substance and Sexual Activity   Alcohol use: No    Comment: recovering alcoholic Since 1017    Drug use: No   Sexual activity: Not Currently  Other Topics Concern   Not on file  Social History Narrative   Married; full time; does not get regular exercise.    Social Determinants of Health   Financial Resource Strain: Low Risk  (11/03/2021)   Overall Financial Resource Strain (CARDIA)    Difficulty of Paying Living Expenses: Not very hard  Food Insecurity: No Food Insecurity (11/03/2021)   Hunger Vital Sign    Worried About Running Out of Food in the Last Year: Never true    Ran Out of Food in the Last Year: Never true  Transportation Needs: No Transportation Needs (11/03/2021)   PRAPARE - Hydrologist (Medical): No    Lack of Transportation (Non-Medical): No  Physical Activity: Inactive (11/03/2021)   Exercise Vital Sign    Days of Exercise per Week: 0 days     Minutes of Exercise per Session: 0 min  Stress: Stress Concern Present (11/03/2021)   [place]    Feeling of Stress : To some extent  Social Connections: Moderately Isolated (11/03/2021)   Social Connection and Isolation Panel [NHANES]    Frequency of Communication with Friends and Family: More than three times a week    Frequency of Social Gatherings with Friends and Family: More than three times a week    Attends Religious Services: Never    Marine scientist or Organizations: No    Attends Archivist Meetings: Never    Marital Status: Married    Allergies:  Allergies  Allergen Reactions   Plaquenil [Hydroxychloroquine] Rash    Metabolic Disorder Labs: Lab Results  Component Value  Date   HGBA1C 5.3 06/26/2021   No results found for: "PROLACTIN" Lab Results  Component Value Date   CHOL 153 11/17/2021   TRIG 202 (H) 11/17/2021   HDL 40 11/17/2021   CHOLHDL 3.8 11/17/2021   LDLCALC 79 11/17/2021   LDLCALC 50 06/26/2021   Lab Results  Component Value Date   TSH 2.170 11/17/2021   TSH 1.310 04/17/2019    Therapeutic Level Labs: No results found for: "LITHIUM" No results found for: "VALPROATE" No results found for: "CBMZ"  Current Medications: Current Outpatient Medications  Medication Sig Dispense Refill   acyclovir ointment (ZOVIRAX) 5 % SMARTSIG:Topical Every 3 Hours     albuterol (VENTOLIN HFA) 108 (90 Base) MCG/ACT inhaler INHALE TWO PUFFS BY MOUTH INTO LUNGS every SIX hours AS NEEDED FOR WHEEZING AND/OR SHORTNESS OF BREATH 8.5 g 2   ARIPiprazole (ABILIFY) 10 MG tablet TAKE ONE TABLET BY MOUTH ONCE DAILY 90 tablet 1   atorvastatin (LIPITOR) 40 MG tablet Take 1 tablet (40 mg total) by mouth every morning. 90 tablet 3   Budeson-Glycopyrrol-Formoterol (BREZTRI AEROSPHERE) 160-9-4.8 MCG/ACT AERO Inhale 2 puffs into the lungs in the morning and at bedtime. 10.7 g 11   carvedilol (COREG)  12.5 MG tablet TAKE ONE TABLET BY MOUTH EVERY MORNING and TAKE ONE TABLET BY MOUTH EVERYDAY AT BEDTIME 180 tablet 3   ezetimibe (ZETIA) 10 MG tablet Take 1 tablet (10 mg total) by mouth every morning. 90 tablet 3   gabapentin (NEURONTIN) 400 MG capsule Take 1 capsule (400 mg total) by mouth 2 (two) times daily. 60 capsule 5   ipratropium (ATROVENT) 0.02 % nebulizer solution Take 2.5 mLs (0.5 mg total) by nebulization 4 (four) times daily as needed for wheezing or shortness of breath. 90 mL 1   leflunomide (ARAVA) 20 MG tablet Take 20 mg by mouth at bedtime.     LINZESS 145 MCG CAPS capsule Take 145 mcg by mouth daily before breakfast.     meloxicam (MOBIC) 15 MG tablet Take 15 mg by mouth at bedtime.     mirtazapine (REMERON) 7.5 MG tablet Take 1 tablet (7.5 mg total) by mouth at bedtime. 90 tablet 0   montelukast (SINGULAIR) 10 MG tablet Take 1 tablet (10 mg total) by mouth at bedtime. 30 tablet 11   Multiple Vitamin (MULTIVITAMIN WITH MINERALS) TABS tablet Take 1 tablet by mouth daily.     Nutritional Supplements (ESTROVEN ENERGY PO) Take 1 tablet by mouth daily.      omeprazole (PRILOSEC) 40 MG capsule Take 1 capsule (40 mg total) by mouth 2 (two) times daily. 180 capsule 1   [START ON 02/18/2022] oxyCODONE-acetaminophen (PERCOCET) 5-325 MG tablet Take 1 tablet by mouth every 8 (eight) hours as needed for severe pain. Must last 30 days. 90 tablet 0   [START ON 03/20/2022] oxyCODONE-acetaminophen (PERCOCET) 5-325 MG tablet Take 1 tablet by mouth every 8 (eight) hours as needed for severe pain. Must last 30 days. 90 tablet 0   potassium chloride SA (KLOR-CON M) 20 MEQ tablet Take 1 tablet (20 mEq total) by mouth daily with breakfast. 90 tablet 1   topiramate (TOPAMAX) 25 MG tablet Take 1 tablet (25 mg total) by mouth 2 (two) times daily. 60 tablet 11   valACYclovir (VALTREX) 1000 MG tablet TAKE TWO TABLETS BY MOUTH twice daily FOR 5 DAYS AS NEEDED FOR FEVER blisters 30 tablet 0   venlafaxine XR  (EFFEXOR-XR) 75 MG 24 hr capsule Take 3 capsules (225 mg total) by mouth  daily with breakfast. TAKE 3 CAPSULES BY MOUTH ONCE DAILY WITH BREAKFAST 270 capsule 1   XIIDRA 5 % SOLN Place 1 drop into both eyes daily.     No current facility-administered medications for this visit.     Musculoskeletal: Strength & Muscle Tone: within normal limits Gait & Station: normal Patient leans: N/A  Psychiatric Specialty Exam: Review of Systems  Musculoskeletal:  Positive for back pain (Chronic).  Psychiatric/Behavioral:  Positive for sleep disturbance.   All other systems reviewed and are negative.   Blood pressure (!) 141/84, pulse 79, temperature 98.5 F (36.9 C), temperature source Temporal, weight 180 lb 9.6 oz (81.9 kg).Body mass index is 26.67 kg/m.  General Appearance: Casual  Eye Contact:  Good  Speech:  Clear and Coherent  Volume:  Normal  Mood:  Euthymic  Affect:  Congruent  Thought Process:  Goal Directed and Descriptions of Associations: Intact  Orientation:  Full (Time, Place, and Person)  Thought Content: Logical   Suicidal Thoughts:  No  Homicidal Thoughts:  No  Memory:  Immediate;   Fair Recent;   Fair Remote;   Fair  Judgement:  Fair  Insight:  Fair  Psychomotor Activity:  Normal  Concentration:  Concentration: Fair and Attention Span: Fair  Recall:  [employer] of Knowledge: Fair  Language: Fair  Akathisia:  No  Handed:  Right  AIMS (if indicated): done  Assets:  Communication Skills Desire for Improvement Housing Social Support Transportation  ADL's:  Intact  Cognition: WNL  Sleep:   Improving   Screenings: [place] Office Visit from 01/22/2022 in [place] Office Visit from 12/26/2021 in [facility] Total Score 0 0      GAD-7    [place] Visit from 01/22/2022 in [facility] Visit from 12/26/2021 in [facility] Visit from 12/04/2019 in [facility]  Total GAD-7 Score 0 0 9      PHQ2-9    [place] Office Visit from 01/22/2022 in [facility] Office Visit from 01/15/2022 in [facility] Office Visit from 12/26/2021 in [facility] Office Visit from 11/17/2021 in [facility] from 11/03/2021 in [facility]  PHQ-2 Total Score 0 0 0 0 0  PHQ-9 Total Score 2 -- 3 6 0      [place] Visit from 01/22/2022 in [facility] Visit from 12/26/2021 in [facility] Admission (Discharged) from 04/15/2021 in [facility] (1A)  [place] No Risk No Risk No Risk        Assessment and Plan: Gabrielle Pineda is a 66 year old Caucasian female, married, on disability, lives in [place], has a history of GAD, MDD, sleep problems, COPD, gastroesophageal reflux disease, hypertension, heart murmur, history of back pain with history of spinal cord stimulator, paroxysmal supraventricular tachycardia, hypercholesterolemia, chronic pain syndrome, was evaluated in office today.  Patient is currently improving with regards to her sleep however will continue to need to work on her sleep hygiene techniques.  Discussed plan as noted below.  Plan  MDD in full remission Venlafaxine 225 mg p.o. daily with breakfast Abilify 10 mg p.o. daily in the morning.   GAD-stable Venlafaxine 225 mg p.o. daily  Primary insomnia-improving Patient advised to start working on sleep hygiene techniques, switch of TV at least an  hour prior to bedtime and start pointing down.  We will consider increasing the dosage of mirtazapine in future sessions. Continue mirtazapine 7.5 mg p.o. nightly  Alcohol use disorder in remission Sober since 1994.  Follow-up in clinic  in 2 to 3 months or sooner if needed.  This note was generated in part or whole with voice recognition software. Voice recognition is usually quite accurate but there are transcription errors that can and very often do occur. I apologize for any typographical errors that were not detected and corrected.   Ursula Alert, MD 01/22/2022, 6:24 PM

## 2022-01-22 NOTE — Progress Notes (Signed)
This patient presents to the office with chief complaint of long thick painful nails.  Patient says the nails are painful walking and wearing shoes.  This patient is unable to self treat.  This patient is unable to trim her nails since she is unable to reach her nails.  She has painful corn on her third toe left foot.  She presents to the office for preventative foot care services.  General Appearance  Alert, conversant and in no acute stress.  Vascular  Dorsalis pedis and posterior tibial  pulses are palpable  bilaterally.  Capillary return is within normal limits  bilaterally. Temperature is within normal limits  bilaterally.  Neurologic  Senn-Weinstein monofilament wire test within normal limits  bilaterally. Muscle power within normal limits bilaterally.  Nails Thick disfigured discolored nails with subungual debris  from hallux to fifth toes bilaterally. No evidence of bacterial infection or drainage bilaterally.  Orthopedic  No limitations of motion  feet .  No crepitus or effusions noted.  No bony pathology or digital deformities noted.  Skin  normotropic skin with no porokeratosis noted bilaterally.  No signs of infections or ulcers noted.   Clavi 3rd toe left foot.  Onychomycosis  Nails  B/L.  Pain in right toes  Pain in left toes  Clavi left foot.  Debridement of nails both feet with nail nipper  followed trimming the nails with dremel tool.  Debride clavi with # 15 blade followed by dremel tool use.  RTC 3 months.   Gardiner Barefoot DPM

## 2022-01-27 NOTE — Patient Instructions (Signed)
Visit Information It was great speaking with you today!  Please let me know if you have any questions about our visit.   Goals Addressed             This Visit's Progress    Track and Manage My Triggers-COPD   On track    Timeframe:  Long-Range Goal Priority:  High Start Date: 11/25/2020                            Expected End Date:  05/27/2022                     Follow Up within 30 days   - avoid second hand smoke - eliminate smoking in my home - identify and remove indoor air pollutants - limit outdoor activity during cold weather    Why is this important?   Triggers are activities or things, like tobacco smoke or cold weather, that make your COPD (chronic obstructive pulmonary disease) flare-up.  Knowing these triggers helps you plan how to stay away from them.  When you cannot remove them, you can learn how to manage them.     Notes:         Patient Care Plan: General Pharmacy (Adult)     Problem Identified: Hypertension, Hyperlipidemia, COPD, Depression, Tobacco use and Chronic Pain, and Rheumatoid Arthritis   Priority: High     Long-Range Goal: Patient-Specific Goal   Start Date: 11/25/2020  Expected End Date: 03/28/2022  This Visit's Progress: On track  Recent Progress: On track  Priority: High  Note:   Current Barriers:  Unable to independently afford treatment regimen Unable to achieve control of COPD  Suboptimal therapeutic regimen for depression  Pharmacist Clinical Goal(s):  Patient will verbalize ability to afford treatment regimen achieve control of COPD as evidenced by stable breathing, avoiding cigarette use, and absence of exacerbations adhere to plan to optimize therapeutic regimen for depression as evidenced by report of adherence to recommended medication management changes through collaboration with PharmD and provider.   Interventions: 1:1 collaboration with Tally Joe, FNP regarding development and update of comprehensive plan of care  as evidenced by provider attestation and co-signature Inter-disciplinary care team collaboration (see longitudinal plan of care) Comprehensive medication review performed; medication list updated in electronic medical record  Hypertension (BP goal <140/90) -Uncontrolled -Current treatment: Carvedilol 12.5 mg twice daily  Furosemide 20 mg daily as needed  -Medications previously tried: Diltiazem,   -Current home readings:   124/83  161/77 -Denies hypotensive/hypertensive symptoms -Dietary Habits: Picky eater, doesn't like many vegetables. Rarely cooks at home, but sister will cook for her. Denies caffeine intake.   Supper: Steak + Potatoes + Corn OR Mac and Cheese  -Recommended to continue current medication for now.  Hyperlipidemia: (LDL goal < 70) -Controlled -Current treatment: Atorvastatin 40 mg daily  Ezetimibe 10 mg daily  -Medications previously tried: Pravastatin, Rosuvastatin  -Educated on Benefits of statin for ASCVD risk reduction; -Recommended to continue current medication  COPD (Goal: control symptoms and prevent exacerbations) -Controlled -Current treatment  Ipratropium 0.02% nebulizer 2.5 mL every 4 hours Albuterol 2 puffs every 6 hours as needed  Breztri 2 puffs twice daily  -Medications previously tried: NA  -Gold Grade: Gold 2 (FEV1 50-79%) -Current COPD Classification:  B (high sx, <2 exacerbations/yr) -MMRC/CAT score: NA -Pulmonary function testing: 71% (2017) -Exacerbations requiring treatment in last 6 months: yes hospitalized Mar 2022 -Frequency of rescue inhaler  use: 1-2 times daily   -Recommended to continue current medication  Depression/Anxiety (Goal: Maintain symptom remission) -Controlled -Current treatment: Aripiprazole 10 mg daily  Mirtazapine 7.5 mg nightly  Venlafaxine XR 75 mg 3 capsules daily -Medications previously tried/failed: Sertraline, Buspar  -PHQ9: 6 -GAD7: 9 -Able to fall asleep within 15-20 minutes, wakes up 4-5 times  nightly. 5-6 hours of sleep nightly.  -Counseled on proper sleep hygiene.  -Continue current medications   Osteopenia (Goal Prevent fractures) -Not ideally controlled -Last DEXA Scan: 12/23/21   T-Score femoral neck: NA  T-Score total hip: -1.3  T-Score lumbar spine: -1.2  T-Score forearm radius: -1.3  10-year probability of major osteoporotic fracture: 17.6%  10-year probability of hip fracture: 1.8% -Patient is not a candidate for pharmacologic treatment -Current treatment  None -Medications previously tried: NA  -Recommend [phone] units of vitamin D daily. Recommend 1200 mg of calcium daily from dietary and supplemental sources.   Patient Goals/Self-Care Activities Patient will:  - check blood pressure weekly, document, and provide at future appointments Utilize maintenance inhaler daily  Follow Up Plan: Telephone follow up appointment with care management team member scheduled for:  07/20/2022 at 11:00 AM    Patient agreed to services and verbal consent obtained.   Patient verbalizes understanding of instructions and care plan provided today and agrees to view in [place]. Active MyChart status and patient understanding of how to access instructions and care plan via MyChart confirmed with patient.     Junius Argyle, PharmD, [place], CPP  Clinical Pharmacist Practitioner  [facility] [phone]

## 2022-02-02 DIAGNOSIS — Z79899 Other long term (current) drug therapy: Secondary | ICD-10-CM | POA: Diagnosis not present

## 2022-02-02 DIAGNOSIS — M0579 Rheumatoid arthritis with rheumatoid factor of multiple sites without organ or systems involvement: Secondary | ICD-10-CM | POA: Diagnosis not present

## 2022-02-04 ENCOUNTER — Ambulatory Visit: Payer: Medicare Other | Attending: Pulmonary Disease

## 2022-02-04 DIAGNOSIS — R0602 Shortness of breath: Secondary | ICD-10-CM | POA: Insufficient documentation

## 2022-02-04 LAB — PULMONARY FUNCTION TEST ARMC ONLY
DL/VA % pred: 58 %
DL/VA: 2.36 ml/min/mmHg/L
DLCO unc % pred: 51 %
DLCO unc: 12.03 ml/min/mmHg
FEF 25-75 Post: 1.08 L/sec
FEF 25-75 Pre: 0.86 L/sec
FEF2575-%Change-Post: 24 %
FEF2575-%Pred-Post: 44 %
FEF2575-%Pred-Pre: 35 %
FEV1-%Change-Post: 8 %
FEV1-%Pred-Post: 54 %
FEV1-%Pred-Pre: 49 %
FEV1-Post: 1.57 L
FEV1-Pre: 1.45 L
FEV1FVC-%Change-Post: 0 %
FEV1FVC-%Pred-Pre: 84 %
FEV6-%Change-Post: 10 %
FEV6-%Pred-Post: 66 %
FEV6-%Pred-Pre: 60 %
FEV6-Post: 2.44 L
FEV6-Pre: 2.22 L
FEV6FVC-%Change-Post: 0 %
FEV6FVC-%Pred-Post: 103 %
FEV6FVC-%Pred-Pre: 103 %
FVC-%Change-Post: 9 %
FVC-%Pred-Post: 64 %
FVC-%Pred-Pre: 59 %
FVC-Post: 2.45 L
FVC-Pre: 2.24 L
Post FEV1/FVC ratio: 64 %
Post FEV6/FVC ratio: 100 %
Pre FEV1/FVC ratio: 65 %
Pre FEV6/FVC Ratio: 100 %
RV % pred: 144 %
RV: 3.39 L
TLC % pred: 107 %
TLC: 6.21 L

## 2022-02-04 MED ORDER — ALBUTEROL SULFATE (2.5 MG/3ML) 0.083% IN NEBU
2.5000 mg | INHALATION_SOLUTION | Freq: Once | RESPIRATORY_TRACT | Status: AC
  Administered 2022-02-04: 2.5 mg via RESPIRATORY_TRACT

## 2022-02-06 ENCOUNTER — Ambulatory Visit: Payer: Medicare Other | Admitting: Adult Health

## 2022-02-06 ENCOUNTER — Encounter: Payer: Self-pay | Admitting: Adult Health

## 2022-02-06 VITALS — BP 126/82 | HR 76 | Temp 98.2°F | Ht 69.0 in | Wt 174.8 lb

## 2022-02-06 DIAGNOSIS — J309 Allergic rhinitis, unspecified: Secondary | ICD-10-CM | POA: Diagnosis not present

## 2022-02-06 DIAGNOSIS — R5381 Other malaise: Secondary | ICD-10-CM | POA: Diagnosis not present

## 2022-02-06 DIAGNOSIS — R918 Other nonspecific abnormal finding of lung field: Secondary | ICD-10-CM | POA: Insufficient documentation

## 2022-02-06 DIAGNOSIS — J449 Chronic obstructive pulmonary disease, unspecified: Secondary | ICD-10-CM | POA: Diagnosis not present

## 2022-02-06 DIAGNOSIS — G894 Chronic pain syndrome: Secondary | ICD-10-CM | POA: Diagnosis not present

## 2022-02-06 MED ORDER — ALBUTEROL SULFATE (2.5 MG/3ML) 0.083% IN NEBU: 2.5000 mg | INHALATION_SOLUTION | RESPIRATORY_TRACT | 2 refills | Status: DC | PRN

## 2022-02-06 NOTE — Assessment & Plan Note (Signed)
Lung nodularity with 0.8 cm nodule on the left and right.  Along with some apical nodularity.  Patient has a history of heavy smoking.  Will need serial surveillance.  CT chest is pending for November.. Once stability is established can refer to the lung cancer screening program.  Plan  Patient Instructions  Continue on BREZTRI 2 puffs Twice daily, rinse after use.  Albuterol inhaler or neb As needed   Activity as tolerated  Check overnight oximetry test .  CT chest in 6 months as planned  Continue on Singulair daily  Mucinex DM Twice daily  As needed  cough/congestion  Follow up with Dr. Jayme Cloud in 6 months and As needed

## 2022-02-06 NOTE — Assessment & Plan Note (Signed)
Physical deconditioning patient is limited with severe arthritis and chronic pain.  Patient's to advance activity as tolerated.  Declines pulmonary rehab

## 2022-02-13 ENCOUNTER — Telehealth: Payer: Self-pay

## 2022-02-13 ENCOUNTER — Encounter: Payer: Self-pay | Admitting: Physician Assistant

## 2022-02-13 NOTE — Progress Notes (Signed)
Chronic Care Management Pharmacy Assistant   Name: ALEASE FAIT  MRN: [id number] DOB: November 01, 1955  Reason for Encounter: Medication Review/Medication Coordination Call for [facility]   Recent office visits:  None ID  Recent consult visits:  02/06/2022 Rexene Edison, NP (Pulmonary) for Follow-up- Started: Albuterol Sulfate 2.5 mg Nebulization every 4 hours prn, Stopped: Ipratropium Bromide 0.5 mg due to change in thearpy, Misc Natural Products,Patient was set up for pulmonary function testing that showed moderate to severe COPD with an FEV1 at 54%, ratio 64, FVC 64%, 8% bronchodilator change, DLCO 51%, No orders placed, Patient to follow-up in 6 months  02/02/2022 Starr Sinclair Defoor, PA (Rheumatology) for RA- No medication changes noted, No orders placed, Patient to follow-up in 2 months  01/22/2022 Ursula Alert, MD ([place]) Unsure of any changes as I did not break the glass to review the note  01/22/2022 Gardiner Barefoot, DPM (Podiatry) for Nail Problem- No medication changes noted, No orders placed, patient to follow-up in 3 months  Hospital visits:  None in previous 6 months  Medications: Outpatient Encounter Medications as of 02/13/2022  Medication Sig   acyclovir ointment (ZOVIRAX) 5 % SMARTSIG:Topical Every 3 Hours   albuterol (PROVENTIL) (2.5 MG/3ML) 0.083% nebulizer solution Take 3 mLs (2.5 mg total) by nebulization every 4 (four) hours as needed for wheezing or shortness of breath.   albuterol (VENTOLIN HFA) 108 (90 Base) MCG/ACT inhaler INHALE TWO PUFFS BY MOUTH INTO LUNGS every SIX hours AS NEEDED FOR WHEEZING AND/OR SHORTNESS OF BREATH   ARIPiprazole (ABILIFY) 10 MG tablet TAKE ONE TABLET BY MOUTH ONCE DAILY   atorvastatin (LIPITOR) 40 MG tablet Take 1 tablet (40 mg total) by mouth every morning.   Budeson-Glycopyrrol-Formoterol (BREZTRI AEROSPHERE) 160-9-4.8 MCG/ACT AERO Inhale 2 puffs into the lungs in the morning and at bedtime.   carvedilol  (COREG) 12.5 MG tablet TAKE ONE TABLET BY MOUTH EVERY MORNING and TAKE ONE TABLET BY MOUTH EVERYDAY AT BEDTIME   ezetimibe (ZETIA) 10 MG tablet Take 1 tablet (10 mg total) by mouth every morning.   gabapentin (NEURONTIN) 400 MG capsule Take 1 capsule (400 mg total) by mouth 2 (two) times daily.   leflunomide (ARAVA) 20 MG tablet Take 20 mg by mouth at bedtime.   LINZESS 145 MCG CAPS capsule Take 145 mcg by mouth daily before breakfast.   meloxicam (MOBIC) 15 MG tablet Take 15 mg by mouth at bedtime.   mirtazapine (REMERON) 7.5 MG tablet Take 1 tablet (7.5 mg total) by mouth at bedtime.   montelukast (SINGULAIR) 10 MG tablet Take 1 tablet (10 mg total) by mouth at bedtime.   Multiple Vitamin (MULTIVITAMIN WITH MINERALS) TABS tablet Take 1 tablet by mouth daily.   omeprazole (PRILOSEC) 40 MG capsule Take 1 capsule (40 mg total) by mouth 2 (two) times daily.   [START ON 02/18/2022] oxyCODONE-acetaminophen (PERCOCET) 5-325 MG tablet Take 1 tablet by mouth every 8 (eight) hours as needed for severe pain. Must last 30 days.   [START ON 03/20/2022] oxyCODONE-acetaminophen (PERCOCET) 5-325 MG tablet Take 1 tablet by mouth every 8 (eight) hours as needed for severe pain. Must last 30 days.   potassium chloride SA (KLOR-CON M) 20 MEQ tablet Take 1 tablet (20 mEq total) by mouth daily with breakfast.   sulfaSALAzine (AZULFIDINE) 500 MG tablet Take 500 mg by mouth 2 (two) times daily.   topiramate (TOPAMAX) 25 MG tablet Take 1 tablet (25 mg total) by mouth 2 (two) times daily.   valACYclovir (VALTREX)  1000 MG tablet TAKE TWO TABLETS BY MOUTH twice daily FOR 5 DAYS AS NEEDED FOR FEVER blisters   venlafaxine XR (EFFEXOR-XR) 75 MG 24 hr capsule Take 3 capsules (225 mg total) by mouth daily with breakfast. TAKE 3 CAPSULES BY MOUTH ONCE DAILY WITH BREAKFAST   XIIDRA 5 % SOLN Place 1 drop into both eyes daily.   No facility-administered encounter medications on file as of 02/13/2022.   Care Gaps: Zoster  Vaccine  Star Rating Drugs: Atorvastatin 40 mg last filled on 01/20/2022 for a 30-Day supply with [facility]  BP Readings from Last 3 Encounters:  02/06/22 126/82  01/15/22 (!) 153/91  12/16/21 123/77    Lab Results  Component Value Date   HGBA1C 5.3 06/26/2021    Patient obtains medications through Adherence Packaging  30 Days   Last adherence delivery included:  Leflunomide 20 mg tablet- Take one tablet by mouth once daily (Bedtime) Buspirone 30 mg tablet- Take one tablet by mouth three times daily (Breakfast, Lunch, Bedtime) Aripiprazole 10 mg tablet- Take one tablet by mouth daily (Breakfast) Ezetimibe (Zetia) 10 mg tablet Take one tablet by mouth daily (Breakfast) Atorvastatin 40 mg tablet- Take one tablet by mouth daily (Breakfast) Carvedilol 12.5 mg tablet - Take on tablet by mouth twice daily (Breakfast, Bedtime) Omeprazole 40 mg- Take one capsule by mouth two times a day (Breakfast, Bedtime) Venlafaxine ER 75 mg capsule- Take three capsules by mouth daily (Breakfast) Potassium 20 mEq - Take one capsule daily (Breakfast) Meloxicam 15 mg tablet- Take one tablet by mouth once daily (Bedtime) Linzess 145 mcg- Take one tablet by mouth daily (Before Breakfast) (Vials) Topiramate 25 mg tablet-Take one tablet by mouth twice daily (Breakfast, Bedtime) Trelegy 100-62.5-25 MCG- Inhale 1 puff daily Eszopiclone 3 mg tablet - Take one tablet by mouth nightly (Bedtime) Gabapentin 400 mg tablet- Take one tablet by mouth twice daily (Breakfast, Bedtime) Montelukast (Singulair) 10 mg tablet 1 tablet daily (Bedtime) Albuterol 90 mcg 2 puffs every 6 hours prn - PRN Medication patient has ample supply at this time  Patient declined medications last month: Albuterol 90 mcg 2 puffs every 6 hours prn - PRN Medication patient has ample supply at this time  Patient is due for next adherence delivery on: 02/26/2022 (Thursday) 2nd Route  Called patient and reviewed medications and  coordinated delivery.  This delivery to include: Meloxicam 15 mg tablet- Take one tablet by mouth once daily (Bedtime) Ezetimibe (Zetia) 10 mg tablet Take one tablet by mouth daily (Breakfast) Atorvastatin 40 mg tablet- Take one tablet by mouth daily (Breakfast) Carvedilol 12.5 mg tablet - Take on tablet by mouth twice daily (Breakfast, Bedtime) Albuterol 90 mcg 2 puffs every 6 hours prn - PRN Medication patient has ample supply at this time Topiramate 25 mg tablet-Take one tablet by mouth twice daily (Breakfast, Bedtime) Gabapentin 400 mg tablet- Take one tablet by mouth twice daily (Breakfast, Bedtime) Linzess 145 mcg- Take one tablet by mouth daily (Before Breakfast) (Vials) Venlafaxine ER 75 mg capsule- Take three capsules by mouth daily (Breakfast) Omeprazole 40 mg- Take one capsule by mouth two times a day (Breakfast, Bedtime) Potassium 20 mEq - Take one capsule daily (Breakfast) Aripiprazole 10 mg tablet- Take one tablet by mouth daily (Breakfast) Buspirone 30 mg tablet- Take one tablet by mouth three times daily (Breakfast, Lunch, Bedtime) Leflunomide 20 mg tablet- Take one tablet by mouth once daily (Bedtime) Montelukast (Singulair) 10 mg tablet 1 tablet daily (Bedtime) Breztri Inhaler 160-9-4.8 mcg 2 puffs in  twice daily  Patient declined the following medications: Albuterol 90 mcg 2 puffs every 6 hours prn - PRN Medication patient has ample supply at this time  Patient needs refills for: No refills needed for this month's delivery  Confirmed delivery date of 02/26/2022 2nd Route, advised patient that pharmacy will contact them the morning of delivery.  I spoke with the patient and she reports that she is doing well today. The patient denies any ill symptoms at this time. Patient states she is no longer taking Trelegy but now she is taking the [employer] inhaler. Patient denies any issues with the new inhaler. Patient did advise the last delivery they sent the Trelegy as well and  had to come back and pick it up. I informed the patient that Trelegy is not on the list so they should not be delivering the medication this time. I encouraged the patient to give me a call if there is an issue with her delivery.  Patient verbalized.  understanding   Patient has a follow-up telephone appointment with Junius Argyle, CPP on 07/20/2022 @ 1100.   Lynann Bologna, CPA/CMA Clinical Pharmacist Assistant Phone: [phone]

## 2022-02-16 ENCOUNTER — Institutional Professional Consult (permissible substitution): Payer: Medicare Other | Admitting: Pulmonary Disease

## 2022-02-19 NOTE — Progress Notes (Signed)
I,Sulibeya S Dimas,acting as a Education administrator for [employer], PA-C.,have documented all relevant documentation on the behalf of Mikey Kirschner, PA-C,as directed by  Mikey Kirschner, PA-C while in the presence of Mikey Kirschner, PA-C.    Established patient visit   Patient: Gabrielle Pineda   DOB: 01-18-1956   66 y.o. Female  MRN: [id number] Visit Date: 02/20/2022  Today's healthcare provider: Mikey Kirschner, PA-C   Chief Complaint  Patient presents with   Obesity   Subjective    Subjective:   Gabrielle Pineda is a 66 y.o. female here for discussion regarding weight loss. She has noted a weight gain of approximately 40-50 pounds over the last 1 year. She feels ideal weight is 135 pounds. Previous treatments for obesity include none. Obesity associated medical conditions: depression, hyperlipidemia, hypertension, osteoarthritis, and COPD. Obesity associated medications: none. Cardiovascular risk factors besides obesity: advanced age (older than 75 for men, 30 for women), hypertension, obesity (BMI >= 30 kg/m2), and sedentary lifestyle.  Reports a 40 pound weight gain in a year. States primarily d/t smoking. Denies any type of physical activity, that even walking the dog is too painful. Feels pain in back, legs. Reports distant history of physical therapy, but unsure what for. She is primarily interested in a weight loss medication as she does not think she can exercise.  She also reports a dry mouth and a burning with certain foods. Already tried biotene, states it did not work.  Medications: Outpatient Medications Prior to Visit  Medication Sig   acyclovir ointment (ZOVIRAX) 5 % SMARTSIG:Topical Every 3 Hours   albuterol (PROVENTIL) (2.5 MG/3ML) 0.083% nebulizer solution Take 3 mLs (2.5 mg total) by nebulization every 4 (four) hours as needed for wheezing or shortness of breath.   albuterol (VENTOLIN HFA) 108 (90 Base) MCG/ACT inhaler INHALE TWO PUFFS BY MOUTH INTO LUNGS every SIX  hours AS NEEDED FOR WHEEZING AND/OR SHORTNESS OF BREATH   ARIPiprazole (ABILIFY) 10 MG tablet TAKE ONE TABLET BY MOUTH ONCE DAILY   atorvastatin (LIPITOR) 40 MG tablet Take 1 tablet (40 mg total) by mouth every morning.   Budeson-Glycopyrrol-Formoterol (BREZTRI AEROSPHERE) 160-9-4.8 MCG/ACT AERO Inhale 2 puffs into the lungs in the morning and at bedtime.   carvedilol (COREG) 12.5 MG tablet TAKE ONE TABLET BY MOUTH EVERY MORNING and TAKE ONE TABLET BY MOUTH EVERYDAY AT BEDTIME   ezetimibe (ZETIA) 10 MG tablet Take 1 tablet (10 mg total) by mouth every morning.   gabapentin (NEURONTIN) 400 MG capsule Take 1 capsule (400 mg total) by mouth 2 (two) times daily.   leflunomide (ARAVA) 20 MG tablet Take 20 mg by mouth at bedtime.   LINZESS 145 MCG CAPS capsule Take 145 mcg by mouth daily before breakfast.   meloxicam (MOBIC) 15 MG tablet Take 15 mg by mouth at bedtime.   mirtazapine (REMERON) 7.5 MG tablet Take 1 tablet (7.5 mg total) by mouth at bedtime.   montelukast (SINGULAIR) 10 MG tablet Take 1 tablet (10 mg total) by mouth at bedtime.   Multiple Vitamin (MULTIVITAMIN WITH MINERALS) TABS tablet Take 1 tablet by mouth daily.   omeprazole (PRILOSEC) 40 MG capsule Take 1 capsule (40 mg total) by mouth 2 (two) times daily.   oxyCODONE-acetaminophen (PERCOCET) 5-325 MG tablet Take 1 tablet by mouth every 8 (eight) hours as needed for severe pain. Must last 30 days.   [START ON 03/20/2022] oxyCODONE-acetaminophen (PERCOCET) 5-325 MG tablet Take 1 tablet by mouth every 8 (eight) hours as needed  for severe pain. Must last 30 days.   potassium chloride SA (KLOR-CON M) 20 MEQ tablet Take 1 tablet (20 mEq total) by mouth daily with breakfast.   sulfaSALAzine (AZULFIDINE) 500 MG tablet Take 1,000 mg by mouth 2 (two) times daily.   topiramate (TOPAMAX) 25 MG tablet Take 1 tablet (25 mg total) by mouth 2 (two) times daily.   valACYclovir (VALTREX) 1000 MG tablet TAKE TWO TABLETS BY MOUTH twice daily FOR 5 DAYS AS  NEEDED FOR FEVER blisters   venlafaxine XR (EFFEXOR-XR) 75 MG 24 hr capsule Take 3 capsules (225 mg total) by mouth daily with breakfast. TAKE 3 CAPSULES BY MOUTH ONCE DAILY WITH BREAKFAST   XIIDRA 5 % SOLN Place 1 drop into both eyes daily.   No facility-administered medications prior to visit.    Review of Systems  Constitutional:  Negative for appetite change, chills, fatigue and fever.  Respiratory:  Positive for shortness of breath. Negative for cough.   Cardiovascular:  Negative for chest pain, palpitations and leg swelling.  Gastrointestinal:  Positive for constipation. Negative for abdominal pain, diarrhea, nausea and vomiting.  Musculoskeletal:  Positive for arthralgias, back pain, joint swelling, myalgias, neck pain and neck stiffness.    Last CBC Lab Results  Component Value Date   WBC 5.9 11/17/2021   HGB 10.9 (L) 11/17/2021   HCT 33.3 (L) 11/17/2021   MCV 94 11/17/2021   MCH 30.9 11/17/2021   RDW 12.2 11/17/2021   PLT 309 22/97/9892   Last metabolic panel Lab Results  Component Value Date   GLUCOSE 96 11/17/2021   NA 142 11/17/2021   K 4.7 11/17/2021   CL 109 (H) 11/17/2021   CO2 20 11/17/2021   BUN 5 (L) 11/17/2021   CREATININE 0.99 11/17/2021   EGFR 63 11/17/2021   CALCIUM 9.4 11/17/2021   PHOS 3.2 05/06/2020   PROT 6.6 11/17/2021   ALBUMIN 4.2 11/17/2021   LABGLOB 2.4 11/17/2021   AGRATIO 1.8 11/17/2021   BILITOT <0.2 11/17/2021   ALKPHOS 173 (H) 11/17/2021   AST 21 11/17/2021   ALT 18 11/17/2021   ANIONGAP 5 04/16/2021   Last lipids Lab Results  Component Value Date   CHOL 153 11/17/2021   HDL 40 11/17/2021   LDLCALC 79 11/17/2021   TRIG 202 (H) 11/17/2021   CHOLHDL 3.8 11/17/2021   Last hemoglobin A1c Lab Results  Component Value Date   HGBA1C 5.3 06/26/2021   Last thyroid functions Lab Results  Component Value Date   TSH 2.170 11/17/2021       Objective    BP 123/61 (BP Location: Left Arm, Patient Position: Sitting, Cuff Size:  Large)   Pulse 68   Temp 98.2 F (36.8 C)   Resp 20   Ht _0  (1.753 m)   Wt 176 lb 1.6 oz (79.9 kg)   SpO2 96%   BMI 26.01 kg/m  BP Readings from Last 3 Encounters:  02/20/22 123/61  02/06/22 126/82  01/15/22 (!) 153/91   Wt Readings from Last 3 Encounters:  02/20/22 176 lb 1.6 oz (79.9 kg)  02/06/22 174 lb 12.8 oz (79.3 kg)  01/15/22 170 lb (77.1 kg)      Physical Exam Vitals reviewed.  Constitutional:      Appearance: She is not ill-appearing.  HENT:     Head: Normocephalic.  Eyes:     Conjunctiva/sclera: Conjunctivae normal.  Cardiovascular:     Rate and Rhythm: Normal rate.  Pulmonary:     Effort: Pulmonary effort  is normal. No respiratory distress.  Neurological:     General: No focal deficit present.     Mental Status: She is alert and oriented to person, place, and time.  Psychiatric:        Mood and Affect: Mood normal.        Behavior: Behavior normal.      No results found for any visits on 02/20/22.  Assessment & Plan     Problem List Items Addressed This Visit       Digestive   Dry mouth    Chronic -- advised to be consistent w/ biotene mouth wash 1-2 times daily and can add a similar mouth spray available OTC, mouth kote is one brand.  Increase fluid intake Can discuss w/ dentist         Other   Anemia    Chronic, considered iron def in the past but I do not see a recent iron panel Pt does not take iron supplements Will recheck cbc, iron, folate, vit b12 and treat accordingly.  Last colonoscopy 2019 due 2024      Relevant Orders   Iron, TIBC and Ferritin Panel   CBC w/Diff/Platelet   Folate   Vitamin B12   Overweight (BMI 25.0-29.9) - Primary    Discussed diet and exercise extensively. Advised pt weight gain is largely d/t inactivity. Ref to nutritionist Advised we could restart physical therapy for back, knee pain, pt declines. At this time I do not think she is an appropriate candidate for medical weight loss      Other  Visit Diagnoses     Weight gain       Relevant Orders   Amb ref to Medical Nutrition Therapy-MNT        Return as scheduled.      I, Mikey Kirschner, PA-C have reviewed all documentation for this visit. The documentation on  02/20/2022 for the exam, diagnosis, procedures, and orders are all accurate and complete.  Mikey Kirschner, PA-C [facility] [address] [place], [place], [address] Office: [phone] Fax: [place]

## 2022-02-20 ENCOUNTER — Ambulatory Visit (INDEPENDENT_AMBULATORY_CARE_PROVIDER_SITE_OTHER): Payer: Medicare Other | Admitting: Physician Assistant

## 2022-02-20 ENCOUNTER — Encounter: Payer: Self-pay | Admitting: Physician Assistant

## 2022-02-20 VITALS — BP 123/61 | HR 68 | Temp 98.2°F | Resp 20 | Ht 69.0 in | Wt 176.1 lb

## 2022-02-20 DIAGNOSIS — R635 Abnormal weight gain: Secondary | ICD-10-CM

## 2022-02-20 DIAGNOSIS — D649 Anemia, unspecified: Secondary | ICD-10-CM

## 2022-02-20 DIAGNOSIS — E663 Overweight: Secondary | ICD-10-CM

## 2022-02-20 DIAGNOSIS — D539 Nutritional anemia, unspecified: Secondary | ICD-10-CM | POA: Insufficient documentation

## 2022-02-20 DIAGNOSIS — R682 Dry mouth, unspecified: Secondary | ICD-10-CM

## 2022-02-20 NOTE — Assessment & Plan Note (Signed)
Chronic, considered iron def in the past but I do not see a recent iron panel Pt does not take iron supplements Will recheck cbc, iron, folate, vit b12 and treat accordingly.  Last colonoscopy 2019 due 2024

## 2022-02-20 NOTE — Assessment & Plan Note (Signed)
Discussed diet and exercise extensively. Advised pt weight gain is largely d/t inactivity. Ref to nutritionist Advised we could restart physical therapy for back, knee pain, pt declines. At this time I do not think she is an appropriate candidate for medical weight loss

## 2022-02-20 NOTE — Assessment & Plan Note (Addendum)
Chronic -- advised to be consistent w/ biotene mouth wash 1-2 times daily and can add a similar mouth spray available OTC, mouth kote is one brand.  Increase fluid intake Can discuss w/ dentist

## 2022-02-21 ENCOUNTER — Encounter: Payer: Self-pay | Admitting: Physician Assistant

## 2022-02-21 LAB — CBC WITH DIFFERENTIAL/PLATELET
Basophils Absolute: 0 10*3/uL (ref 0.0–0.2)
Basos: 1 %
EOS (ABSOLUTE): 0.3 10*3/uL (ref 0.0–0.4)
Eos: 6 %
Hematocrit: 30.7 % — ABNORMAL LOW (ref 34.0–46.6)
Hemoglobin: 10 g/dL — ABNORMAL LOW (ref 11.1–15.9)
Immature Grans (Abs): 0 10*3/uL (ref 0.0–0.1)
Immature Granulocytes: 0 %
Lymphocytes Absolute: 1.2 10*3/uL (ref 0.7–3.1)
Lymphs: 25 %
MCH: 31.3 pg (ref 26.6–33.0)
MCHC: 32.6 g/dL (ref 31.5–35.7)
MCV: 96 fL (ref 79–97)
Monocytes Absolute: 0.7 10*3/uL (ref 0.1–0.9)
Monocytes: 15 %
Neutrophils Absolute: 2.6 10*3/uL (ref 1.4–7.0)
Neutrophils: 53 %
Platelets: 252 10*3/uL (ref 150–450)
RBC: 3.2 x10E6/uL — ABNORMAL LOW (ref 3.77–5.28)
RDW: 13.5 % (ref 11.7–15.4)
WBC: 4.8 10*3/uL (ref 3.4–10.8)

## 2022-02-21 LAB — VITAMIN B12: Vitamin B-12: 674 pg/mL (ref 232–1245)

## 2022-02-21 LAB — IRON,TIBC AND FERRITIN PANEL
Ferritin: 173 ng/mL — ABNORMAL HIGH (ref 15–150)
Iron Saturation: 44 % (ref 15–55)
Iron: 109 ug/dL (ref 27–139)
Total Iron Binding Capacity: 248 ug/dL — ABNORMAL LOW (ref 250–450)
UIBC: 139 ug/dL (ref 118–369)

## 2022-02-21 LAB — FOLATE: Folate: 15.4 ng/mL (ref >3.0)

## 2022-02-24 ENCOUNTER — Other Ambulatory Visit: Payer: Self-pay | Admitting: Physician Assistant

## 2022-02-24 DIAGNOSIS — D649 Anemia, unspecified: Secondary | ICD-10-CM

## 2022-02-26 ENCOUNTER — Encounter: Payer: Self-pay | Admitting: Physician Assistant

## 2022-03-04 ENCOUNTER — Telehealth: Payer: Self-pay | Admitting: Adult Health

## 2022-03-04 DIAGNOSIS — J449 Chronic obstructive pulmonary disease, unspecified: Secondary | ICD-10-CM

## 2022-03-04 NOTE — Telephone Encounter (Signed)
Spoke with patient.  She is requesting ONO results.  We have not received results.  I have spoken to [facility] with [employer] and requested results.

## 2022-03-05 NOTE — Telephone Encounter (Signed)
ONO on 02/13/22 showed + desats with 2 hr < 88% .  Please begin O2 2l/m At bedtime   Keep office follow up

## 2022-03-06 ENCOUNTER — Telehealth: Payer: Self-pay | Admitting: *Deleted

## 2022-03-06 DIAGNOSIS — G4734 Idiopathic sleep related nonobstructive alveolar hypoventilation: Secondary | ICD-10-CM

## 2022-03-06 DIAGNOSIS — J449 Chronic obstructive pulmonary disease, unspecified: Secondary | ICD-10-CM | POA: Diagnosis not present

## 2022-03-06 NOTE — Telephone Encounter (Signed)
Called and spoke with patient, advised of results/recommendations per Rexene Edison NP.  Advised that her oxygen levels do drop below 88% that qualifies her for nighttime oxygen.  I let her know I would put in an order for oxygen 2L at nighttime and she will receive a call to have it set up.  She verbalized understanding.  Nothing further needed.

## 2022-03-06 NOTE — Telephone Encounter (Signed)
Patient is aware of results and voiced her understanding.  Order placed to [facility] for nocturnal oxygen. Nothing further needed.

## 2022-03-10 ENCOUNTER — Inpatient Hospital Stay: Payer: Medicare Other

## 2022-03-10 ENCOUNTER — Encounter: Payer: Self-pay | Admitting: Oncology

## 2022-03-10 ENCOUNTER — Inpatient Hospital Stay: Payer: Medicare Other | Attending: Oncology | Admitting: Oncology

## 2022-03-10 VITALS — BP 153/102 | HR 71 | Temp 97.5°F | Resp 18 | Ht 69.0 in | Wt 175.2 lb

## 2022-03-10 DIAGNOSIS — D539 Nutritional anemia, unspecified: Secondary | ICD-10-CM | POA: Insufficient documentation

## 2022-03-10 DIAGNOSIS — M069 Rheumatoid arthritis, unspecified: Secondary | ICD-10-CM

## 2022-03-10 DIAGNOSIS — D649 Anemia, unspecified: Secondary | ICD-10-CM

## 2022-03-10 DIAGNOSIS — Z79899 Other long term (current) drug therapy: Secondary | ICD-10-CM | POA: Insufficient documentation

## 2022-03-10 LAB — CBC WITH DIFFERENTIAL/PLATELET
Abs Immature Granulocytes: 0.06 10*3/uL (ref 0.00–0.07)
Basophils Absolute: 0 10*3/uL (ref 0.0–0.1)
Basophils Relative: 1 %
Eosinophils Absolute: 0 10*3/uL (ref 0.0–0.5)
Eosinophils Relative: 0 %
HCT: 31.1 % — ABNORMAL LOW (ref 36.0–46.0)
Hemoglobin: 9.8 g/dL — ABNORMAL LOW (ref 12.0–15.0)
Immature Granulocytes: 1 %
Lymphocytes Relative: 20 %
Lymphs Abs: 1.1 10*3/uL (ref 0.7–4.0)
MCH: 32.3 pg (ref 26.0–34.0)
MCHC: 31.5 g/dL (ref 30.0–36.0)
MCV: 102.6 fL — ABNORMAL HIGH (ref 80.0–100.0)
Monocytes Absolute: 0.5 10*3/uL (ref 0.1–1.0)
Monocytes Relative: 9 %
Neutro Abs: 4 10*3/uL (ref 1.7–7.7)
Neutrophils Relative %: 69 %
Platelets: 223 10*3/uL (ref 150–400)
RBC: 3.03 MIL/uL — ABNORMAL LOW (ref 3.87–5.11)
RDW: 13.5 % (ref 11.5–15.5)
WBC: 5.7 10*3/uL (ref 4.0–10.5)
nRBC: 0 % (ref 0.0–0.2)

## 2022-03-10 LAB — LACTATE DEHYDROGENASE: LDH: 165 U/L (ref 98–192)

## 2022-03-10 LAB — COMPREHENSIVE METABOLIC PANEL
ALT: 14 U/L (ref 0–44)
AST: 22 U/L (ref 15–41)
Albumin: 3.8 g/dL (ref 3.5–5.0)
Alkaline Phosphatase: 103 U/L (ref 38–126)
Anion gap: 5 (ref 5–15)
BUN: 11 mg/dL (ref 8–23)
CO2: 19 mmol/L — ABNORMAL LOW (ref 22–32)
Calcium: 8.6 mg/dL — ABNORMAL LOW (ref 8.9–10.3)
Chloride: 109 mmol/L (ref 98–111)
Creatinine, Ser: 1.02 mg/dL — ABNORMAL HIGH (ref 0.44–1.00)
GFR, Estimated: >60 mL/min (ref >60)
Glucose, Bld: 108 mg/dL — ABNORMAL HIGH (ref 70–99)
Potassium: 3.5 mmol/L (ref 3.5–5.1)
Sodium: 133 mmol/L — ABNORMAL LOW (ref 135–145)
Total Bilirubin: 0.5 mg/dL (ref 0.3–1.2)
Total Protein: 6.2 g/dL — ABNORMAL LOW (ref 6.5–8.1)

## 2022-03-10 LAB — TECHNOLOGIST SMEAR REVIEW
Plt Morphology: NORMAL
RBC MORPHOLOGY: NORMAL
WBC MORPHOLOGY: NORMAL

## 2022-03-10 LAB — RETIC PANEL
Immature Retic Fract: 17.8 % — ABNORMAL HIGH (ref 2.3–15.9)
RBC.: 3 MIL/uL — ABNORMAL LOW (ref 3.87–5.11)
Retic Count, Absolute: 111 10*3/uL (ref 19.0–186.0)
Retic Ct Pct: 3.7 % — ABNORMAL HIGH (ref 0.4–3.1)
Reticulocyte Hemoglobin: 35.7 pg (ref >27.9)

## 2022-03-10 LAB — TSH: TSH: 3.673 u[IU]/mL (ref 0.350–4.500)

## 2022-03-10 NOTE — Assessment & Plan Note (Signed)
Currently on Arava and a sulfasalazine.  Chronic inflammation may contribute to her anemia.  

## 2022-03-10 NOTE — Progress Notes (Addendum)
Hematology/Oncology Consult note [phone] [phone]      Patient Care Team: Gwyneth Sprout, FNP as PCP - General (Family Medicine) Minna Merritts, MD as PCP - Cardiology (Cardiology) Manya Silvas, MD (Inactive) (Gastroenterology) Germaine Pomfret, [facility] (Pharmacist)   REFERRING PROVIDER: Mikey Kirschner, PA-C  CHIEF COMPLAINTS/REASON FOR VISIT:  Anemia  ASSESSMENT & PLAN:  Macrocytic anemia Chronic normocytic anemia Normal iron panel, B12 and folate Check CBC, CMP, reticulocyte panel, TSH, check smear, multiple myeloma panel, light chain ratio, LDH  Orders Placed This Encounter  Procedures   Retic Panel    Standing Status:   Future    Number of Occurrences:   1    Standing Expiration Date:   03/11/2023   CBC with Differential/Platelet    Standing Status:   Future    Number of Occurrences:   1    Standing Expiration Date:   03/11/2023   Comprehensive metabolic panel    Standing Status:   Future    Number of Occurrences:   1    Standing Expiration Date:   03/11/2023   TSH    Standing Status:   Future    Number of Occurrences:   1    Standing Expiration Date:   03/11/2023   Technologist smear review    Standing Status:   Future    Number of Occurrences:   1    Standing Expiration Date:   03/11/2023   Multiple Myeloma Panel (SPEP&IFE w/QIG)    Standing Status:   Future    Number of Occurrences:   1    Standing Expiration Date:   03/11/2023   Kappa/lambda light chains    Standing Status:   Future    Number of Occurrences:   1    Standing Expiration Date:   03/11/2023   Lactate dehydrogenase    Standing Status:   Future    Number of Occurrences:   1    Standing Expiration Date:   03/11/2023   Follow-up in 3 to 4 weeks to review results. All questions were answered. The patient knows to call the clinic with any problems, questions or concerns.  Earlie Server, MD, PhD [facility] Hematology Oncology 03/10/2022     HISTORY OF  PRESENTING ILLNESS:  Gabrielle Pineda is a  66 y.o.  female with PMH listed below who was referred to me for anemia Reviewed patient's recent labs that was done.  She was found to have abnormal CBC on 02/20/2022, she has a hemoglobin of 10, MCV 96, normal white count and platelet counts.  Normal differential.  Iron panel showed TIBC 248, ferritin 173, iron saturation 44.  Folate 15.4, vitamin B12 674. Reviewed patient's previous labs ordered by primary care physician's office, anemia is chronic onset , duration is since July 2022. No aggravating or improving factors. Patient reports feeling fatigued. She denies recent chest pain on exertion, shortness of breath on minimal exertion, pre-syncopal episodes, or palpitations She had not noticed any recent bleeding such as epistaxis, hematuria or hematochezia.  She takes over the counter NSAID [Advil, Mobic].  Patient has autoimmune disease-rheumatoid arthritis, she is taking Arava and sulfasalazine. Her last colonoscopy was 11/01/2017. She denies any pica and eats a variety of diet.  Patient has a history of alcohol abuse and she has been sober for many years.  She had alcoholic cirrhosis diagnosed in 1994.  All recovered per patient.  MEDICAL HISTORY:  Past Medical History:  Diagnosis Date   Alcohol  abuse    Anemia    Anxiety    Arthritis    Cervicalgia    Cirrhosis ([place]) 1994   COPD (chronic obstructive pulmonary disease) ([place])    Depression    Difficult intubation    has plates and screws in neck   Dyspnea    GERD (gastroesophageal reflux disease)    Headache    Heart murmur    on heard when pt is lying   Hypertension    Other and unspecified hyperlipidemia    Status post insertion of spinal cord stimulator    Tachycardia    d/t questionable anxiety happens every 5- 10 years, sees [facility]    SURGICAL HISTORY: Past Surgical History:  Procedure Laterality Date   ANTERIOR CERVICAL DECOMP/DISCECTOMY FUSION  2012, 2015,  2018   x3   AUGMENTATION MAMMAPLASTY Bilateral [place] RELEASE  11/11/2011   Procedure: CARPAL TUNNEL RELEASE;  Surgeon: Floyce Stakes, MD;  Location: [facility] NEURO ORS;  Service: Neurosurgery;  Laterality: Right;  Right Median Nerve Decompression   CARPAL TUNNEL RELEASE  2013   COLONOSCOPY WITH PROPOFOL N/A 11/01/2017   Procedure: COLONOSCOPY WITH PROPOFOL;  Surgeon: Manya Silvas, MD;  Location: [facility] ENDOSCOPY;  Service: Endoscopy;  Laterality: N/A;   ESOPHAGOGASTRODUODENOSCOPY     LUMBAR LAMINECTOMY/DECOMPRESSION MICRODISCECTOMY N/A 08/15/2019   Procedure: Lumbar three to Sacral one Decompressive lumbar laminectomy;  Surgeon: Erline Levine, MD;  Location: [facility];  Service: Neurosurgery;  Laterality: N/A;   neck disc surgery     plates and screws in neck x 2   ROTATOR CUFF REPAIR  06/16/2016   right shoulder    ROTATOR CUFF REPAIR     SPINAL CORD STIMULATOR INSERTION N/A 03/17/2021   Procedure: THORACIC SPINAL CORD STIMULATOR (PERCUTANEOUS) & PULSE GENERATOR PLACEMENT;  Surgeon: [name], MD;  Location: [facility] ORS;  Service: Neurosurgery;  Laterality: N/A;   TONSILLECTOMY AND ADENOIDECTOMY  1973    TOTAL HIP ARTHROPLASTY Right 04/15/2021   Procedure: TOTAL HIP ARTHROPLASTY ANTERIOR APPROACH;  Surgeon: Hessie Knows, MD;  Location: [facility] ORS;  Service: Orthopedics;  Laterality: Right;    SOCIAL HISTORY: Social History   Socioeconomic History   Marital status: Married    Spouse name: jerry   Number of children: 0   Years of education: Not on file   Highest education level: Bachelor's degree (e.g., BA, AB, BS)  Occupational History   Not on file  Tobacco Use   Smoking status: Former    Packs/day: 2.00    Years: 40.00    Total pack years: 80.00    Types: Cigarettes    Quit date: 10/30/2020    Years since quitting: 1.3   Smokeless tobacco: Never  Vaping Use   Vaping Use: Former   Devices: vape occasionally  Substance and Sexual  Activity   Alcohol use: No    Comment: recovering alcoholic Since 9211    Drug use: No   Sexual activity: Not Currently  Other Topics Concern   Not on file  Social History Narrative   Married; full time; does not get regular exercise.    Social Determinants of Health   Financial Resource Strain: Low Risk  (11/03/2021)   Overall Financial Resource Strain (CARDIA)    Difficulty of Paying Living Expenses: Not very hard  Food Insecurity: No Food Insecurity (11/03/2021)   Hunger Vital Sign    Worried About Running Out of Food in the Last Year:  Never true    Ran Out of Food in the Last Year: Never true  Transportation Needs: No Transportation Needs (11/03/2021)   PRAPARE - Hydrologist (Medical): No    Lack of Transportation (Non-Medical): No  Physical Activity: Inactive (11/03/2021)   Exercise Vital Sign    Days of Exercise per Week: 0 days    Minutes of Exercise per Session: 0 min  Stress: Stress Concern Present (11/03/2021)   [place]    Feeling of Stress : To some extent  Social Connections: Moderately Isolated (11/03/2021)   Social Connection and Isolation Panel [NHANES]    Frequency of Communication with Friends and Family: More than three times a week    Frequency of Social Gatherings with Friends and Family: More than three times a week    Attends Religious Services: Never    Marine scientist or Organizations: No    Attends Archivist Meetings: Never    Marital Status: Married  Human resources officer Violence: Not At Risk (11/03/2021)   Humiliation, Afraid, Rape, and Kick questionnaire    Fear of Current or Ex-Partner: No    Emotionally Abused: No    Physically Abused: No    Sexually Abused: No    FAMILY HISTORY: Family History  Problem Relation Age of Onset   Alcohol abuse Mother    Bipolar disorder Mother    Suicidality Mother    Pancreatic cancer Father     Alcohol abuse Father    Drug abuse Sister    Alcohol abuse Sister    Depression Sister    Anesthesia problems Neg Hx    Breast cancer Neg Hx     ALLERGIES:  is allergic to plaquenil [hydroxychloroquine].  MEDICATIONS:  Current Outpatient Medications  Medication Sig Dispense Refill   acyclovir ointment (ZOVIRAX) 5 % SMARTSIG:Topical Every 3 Hours     albuterol (PROVENTIL) (2.5 MG/3ML) 0.083% nebulizer solution Take 3 mLs (2.5 mg total) by nebulization every 4 (four) hours as needed for wheezing or shortness of breath. 225 mL 2   albuterol (VENTOLIN HFA) 108 (90 Base) MCG/ACT inhaler INHALE TWO PUFFS BY MOUTH INTO LUNGS every SIX hours AS NEEDED FOR WHEEZING AND/OR SHORTNESS OF BREATH 8.5 g 2   ARIPiprazole (ABILIFY) 10 MG tablet TAKE ONE TABLET BY MOUTH ONCE DAILY 90 tablet 1   atorvastatin (LIPITOR) 40 MG tablet Take 1 tablet (40 mg total) by mouth every morning. 90 tablet 3   Budeson-Glycopyrrol-Formoterol (BREZTRI AEROSPHERE) 160-9-4.8 MCG/ACT AERO Inhale 2 puffs into the lungs in the morning and at bedtime. 10.7 g 11   carvedilol (COREG) 12.5 MG tablet TAKE ONE TABLET BY MOUTH EVERY MORNING and TAKE ONE TABLET BY MOUTH EVERYDAY AT BEDTIME 180 tablet 3   ezetimibe (ZETIA) 10 MG tablet Take 1 tablet (10 mg total) by mouth every morning. 90 tablet 3   gabapentin (NEURONTIN) 400 MG capsule Take 1 capsule (400 mg total) by mouth 2 (two) times daily. 60 capsule 5   leflunomide (ARAVA) 20 MG tablet Take 20 mg by mouth at bedtime.     LINZESS 145 MCG CAPS capsule Take 145 mcg by mouth daily before breakfast.     meloxicam (MOBIC) 15 MG tablet Take 15 mg by mouth at bedtime.     mirtazapine (REMERON) 7.5 MG tablet Take 1 tablet (7.5 mg total) by mouth at bedtime. 90 tablet 0   montelukast (SINGULAIR) 10 MG tablet Take 1 tablet (  10 mg total) by mouth at bedtime. 30 tablet 11   Multiple Vitamin (MULTIVITAMIN WITH MINERALS) TABS tablet Take 1 tablet by mouth daily.     omeprazole (PRILOSEC) 40 MG  capsule Take 1 capsule (40 mg total) by mouth 2 (two) times daily. 180 capsule 1   oxyCODONE-acetaminophen (PERCOCET) 5-325 MG tablet Take 1 tablet by mouth every 8 (eight) hours as needed for severe pain. Must last 30 days. 90 tablet 0   potassium chloride SA (KLOR-CON M) 20 MEQ tablet Take 1 tablet (20 mEq total) by mouth daily with breakfast. 90 tablet 1   sulfaSALAzine (AZULFIDINE) 500 MG tablet Take 1,000 mg by mouth 2 (two) times daily.     topiramate (TOPAMAX) 25 MG tablet Take 1 tablet (25 mg total) by mouth 2 (two) times daily. 60 tablet 11   venlafaxine XR (EFFEXOR-XR) 75 MG 24 hr capsule Take 3 capsules (225 mg total) by mouth daily with breakfast. TAKE 3 CAPSULES BY MOUTH ONCE DAILY WITH BREAKFAST 270 capsule 1   XIIDRA 5 % SOLN Place 1 drop into both eyes daily.     [START ON 03/20/2022] oxyCODONE-acetaminophen (PERCOCET) 5-325 MG tablet Take 1 tablet by mouth every 8 (eight) hours as needed for severe pain. Must last 30 days. (Patient not taking: Reported on 03/10/2022) 90 tablet 0   valACYclovir (VALTREX) 1000 MG tablet TAKE TWO TABLETS BY MOUTH twice daily FOR 5 DAYS AS NEEDED FOR FEVER blisters (Patient not taking: Reported on 03/10/2022) 30 tablet 0   No current facility-administered medications for this visit.    Review of Systems  Constitutional:  Positive for fatigue. Negative for appetite change, chills and fever.  HENT:   Negative for hearing loss and voice change.   Eyes:  Negative for eye problems.  Respiratory:  Negative for chest tightness and cough.   Cardiovascular:  Negative for chest pain.  Gastrointestinal:  Negative for abdominal distention, abdominal pain and blood in stool.  Endocrine: Negative for hot flashes.  Genitourinary:  Negative for difficulty urinating and frequency.   Musculoskeletal:  Positive for arthralgias.  Skin:  Negative for itching and rash.  Neurological:  Negative for extremity weakness.  Hematological:  Negative for adenopathy.   Psychiatric/Behavioral:  Negative for confusion.     PHYSICAL EXAMINATION: ECOG PERFORMANCE STATUS: 1 - Symptomatic but completely ambulatory Vitals:   03/10/22 0932  BP: (!) 153/102  Pulse: 71  Resp: 18  Temp: (!) 97.5 F (36.4 C)   Filed Weights   03/10/22 0932  Weight: 175 lb 3.2 oz (79.5 kg)    Physical Exam Constitutional:      General: She is not in acute distress. HENT:     Head: Normocephalic and atraumatic.  Eyes:     General: No scleral icterus. Cardiovascular:     Rate and Rhythm: Normal rate and regular rhythm.     Heart sounds: Normal heart sounds.  Pulmonary:     Effort: Pulmonary effort is normal. No respiratory distress.     Breath sounds: No wheezing.  Abdominal:     General: Bowel sounds are normal. There is no distension.     Palpations: Abdomen is soft.  Musculoskeletal:        General: No deformity. Normal range of motion.     Cervical back: Normal range of motion and neck supple.  Skin:    General: Skin is warm and dry.     Findings: No erythema or rash.  Neurological:     Mental Status: She  is alert and oriented to person, place, and time. Mental status is at baseline.     Cranial Nerves: No cranial nerve deficit.     Coordination: Coordination normal.  Psychiatric:        Mood and Affect: Mood normal.      LABORATORY DATA:  I have reviewed the data as listed    Latest Ref Rng & Units 03/10/2022   10:11 AM 02/20/2022    8:42 AM 11/17/2021    9:59 AM  CBC  WBC 4.0 - 10.5 K/uL 5.7  4.8  5.9   Hemoglobin 12.0 - 15.0 g/dL 9.8  10.0  10.9   Hematocrit 36.0 - 46.0 % 31.1  30.7  33.3   Platelets 150 - 400 K/uL 223  252  309       Latest Ref Rng & Units 03/10/2022   10:11 AM 11/17/2021    9:59 AM 04/16/2021    4:26 AM  CMP  Glucose 70 - 99 mg/dL 108  96  134   BUN 8 - 23 mg/dL _0 Creatinine 0.44 - 1.00 mg/dL 1.02  0.99  0.99   Sodium 135 - 145 mmol/L 133  142  139   Potassium 3.5 - 5.1 mmol/L 3.5  4.7  3.5   Chloride 98 - 111  mmol/L 109  109  111   CO2 22 - 32 mmol/L _1 Calcium 8.9 - 10.3 mg/dL 8.6  9.4  8.3   Total Protein 6.5 - 8.1 g/dL 6.2  6.6    Total Bilirubin 0.3 - 1.2 mg/dL 0.5  <0.2    Alkaline Phos 38 - 126 U/L 103  173    AST 15 - 41 U/L 22  21    ALT 0 - 44 U/L 14  18          Component Value Date/Time   IRON 109 02/20/2022 0842   TIBC 248 (L) 02/20/2022 0842   FERRITIN 173 (H) 02/20/2022 0842   IRONPCTSAT 44 02/20/2022 0842     RADIOGRAPHIC STUDIES: I have personally reviewed the radiological images as listed and agreed with the findings in the report. No results found.

## 2022-03-10 NOTE — Assessment & Plan Note (Signed)
Chronic normocytic anemia Normal iron panel, B12 and folate Check CBC, CMP, reticulocyte panel, TSH, check smear, multiple myeloma panel, light chain ratio, LDH

## 2022-03-11 LAB — KAPPA/LAMBDA LIGHT CHAINS
Kappa free light chain: 14.5 mg/L (ref 3.3–19.4)
Kappa, lambda light chain ratio: 1.06 (ref 0.26–1.65)
Lambda free light chains: 13.7 mg/L (ref 5.7–26.3)

## 2022-03-16 LAB — MULTIPLE MYELOMA PANEL, SERUM
Albumin SerPl Elph-Mcnc: 3.5 g/dL (ref 2.9–4.4)
Albumin/Glob SerPl: 1.4 (ref 0.7–1.7)
Alpha 1: 0.2 g/dL (ref 0.0–0.4)
Alpha2 Glob SerPl Elph-Mcnc: 0.9 g/dL (ref 0.4–1.0)
B-Globulin SerPl Elph-Mcnc: 0.8 g/dL (ref 0.7–1.3)
Gamma Glob SerPl Elph-Mcnc: 0.7 g/dL (ref 0.4–1.8)
Globulin, Total: 2.6 g/dL (ref 2.2–3.9)
IgA: 150 mg/dL (ref 87–352)
IgG (Immunoglobin G), Serum: 676 mg/dL (ref 586–1602)
IgM (Immunoglobulin M), Srm: 33 mg/dL (ref 26–217)
Total Protein ELP: 6.1 g/dL (ref 6.0–8.5)

## 2022-03-17 ENCOUNTER — Telehealth: Payer: Self-pay

## 2022-03-17 NOTE — Progress Notes (Signed)
Chronic Care Management Pharmacy Assistant   Name: Gabrielle Pineda  MRN: [id number] DOB: June 11, 1956  Reason for Encounter: Medication Review/Medication Coordination for [facility]   Recent office visits:  02/20/2022 Mikey Kirschner, PA-C (PCP Office Visit) for Obesity- No medication changes noted, Lab orders placed, Referral to Medical Nutrition Therapy placed, No follow-up noted  Recent consult visits:  03/10/2022 Earlie Server, MD (Oncology) for Initial Visit- No medication changes noted, Lab orders placed, BP 153/102, No follow-up noted  03/04/2022 Rexene Edison, NP (Pulmonology Telephone results) Started: O2 2L at bedtime  Hospital visits:  None in previous 6 months  Medications: Outpatient Encounter Medications as of 03/17/2022  Medication Sig   acyclovir ointment (ZOVIRAX) 5 % SMARTSIG:Topical Every 3 Hours   albuterol (PROVENTIL) (2.5 MG/3ML) 0.083% nebulizer solution Take 3 mLs (2.5 mg total) by nebulization every 4 (four) hours as needed for wheezing or shortness of breath.   albuterol (VENTOLIN HFA) 108 (90 Base) MCG/ACT inhaler INHALE TWO PUFFS BY MOUTH INTO LUNGS every SIX hours AS NEEDED FOR WHEEZING AND/OR SHORTNESS OF BREATH   ARIPiprazole (ABILIFY) 10 MG tablet TAKE ONE TABLET BY MOUTH ONCE DAILY   atorvastatin (LIPITOR) 40 MG tablet Take 1 tablet (40 mg total) by mouth every morning.   Budeson-Glycopyrrol-Formoterol (BREZTRI AEROSPHERE) 160-9-4.8 MCG/ACT AERO Inhale 2 puffs into the lungs in the morning and at bedtime.   carvedilol (COREG) 12.5 MG tablet TAKE ONE TABLET BY MOUTH EVERY MORNING and TAKE ONE TABLET BY MOUTH EVERYDAY AT BEDTIME   ezetimibe (ZETIA) 10 MG tablet Take 1 tablet (10 mg total) by mouth every morning.   gabapentin (NEURONTIN) 400 MG capsule Take 1 capsule (400 mg total) by mouth 2 (two) times daily.   leflunomide (ARAVA) 20 MG tablet Take 20 mg by mouth at bedtime.   LINZESS 145 MCG CAPS capsule Take 145 mcg by mouth daily before breakfast.    meloxicam (MOBIC) 15 MG tablet Take 15 mg by mouth at bedtime.   mirtazapine (REMERON) 7.5 MG tablet Take 1 tablet (7.5 mg total) by mouth at bedtime.   montelukast (SINGULAIR) 10 MG tablet Take 1 tablet (10 mg total) by mouth at bedtime.   Multiple Vitamin (MULTIVITAMIN WITH MINERALS) TABS tablet Take 1 tablet by mouth daily.   omeprazole (PRILOSEC) 40 MG capsule Take 1 capsule (40 mg total) by mouth 2 (two) times daily.   oxyCODONE-acetaminophen (PERCOCET) 5-325 MG tablet Take 1 tablet by mouth every 8 (eight) hours as needed for severe pain. Must last 30 days.   [START ON 03/20/2022] oxyCODONE-acetaminophen (PERCOCET) 5-325 MG tablet Take 1 tablet by mouth every 8 (eight) hours as needed for severe pain. Must last 30 days. (Patient not taking: Reported on 03/10/2022)   potassium chloride SA (KLOR-CON M) 20 MEQ tablet Take 1 tablet (20 mEq total) by mouth daily with breakfast.   sulfaSALAzine (AZULFIDINE) 500 MG tablet Take 1,000 mg by mouth 2 (two) times daily.   topiramate (TOPAMAX) 25 MG tablet Take 1 tablet (25 mg total) by mouth 2 (two) times daily.   valACYclovir (VALTREX) 1000 MG tablet TAKE TWO TABLETS BY MOUTH twice daily FOR 5 DAYS AS NEEDED FOR FEVER blisters (Patient not taking: Reported on 03/10/2022)   venlafaxine XR (EFFEXOR-XR) 75 MG 24 hr capsule Take 3 capsules (225 mg total) by mouth daily with breakfast. TAKE 3 CAPSULES BY MOUTH ONCE DAILY WITH BREAKFAST   XIIDRA 5 % SOLN Place 1 drop into both eyes daily.   No facility-administered encounter medications on  file as of 03/17/2022.   Care Gaps: Zoster Vaccine COVID-19 Vaccine Booster 3 Influenza Vaccine  BP > 140/90  Star Rating Drugs: Atorvastatin 40 mg last filled on 02/20/2022 for a 30-Day supply with [facility]  BP Readings from Last 3 Encounters:  03/10/22 (!) 153/102  02/20/22 123/61  02/06/22 126/82    Lab Results  Component Value Date   HGBA1C 5.3 06/26/2021   Patient obtains medications through  Adherence Packaging  30 Days   Last adherence delivery included:  Meloxicam 15 mg tablet- Take one tablet by mouth once daily (Bedtime) Ezetimibe (Zetia) 10 mg tablet Take one tablet by mouth daily (Breakfast) Atorvastatin 40 mg tablet- Take one tablet by mouth daily (Breakfast) Carvedilol 12.5 mg tablet - Take on tablet by mouth twice daily (Breakfast, Bedtime) Albuterol 90 mcg 2 puffs every 6 hours prn - PRN Medication patient has ample supply at this time Topiramate 25 mg tablet-Take one tablet by mouth twice daily (Breakfast, Bedtime) Gabapentin 400 mg tablet- Take one tablet by mouth twice daily (Breakfast, Bedtime) Linzess 145 mcg- Take one tablet by mouth daily (Before Breakfast) (Vials) Venlafaxine ER 75 mg capsule- Take three capsules by mouth daily (Breakfast) Omeprazole 40 mg- Take one capsule by mouth two times a day (Breakfast, Bedtime) Potassium 20 mEq - Take one capsule daily (Breakfast) Aripiprazole 10 mg tablet- Take one tablet by mouth daily (Breakfast) Buspirone 30 mg tablet- Take one tablet by mouth three times daily (Breakfast, Lunch, Bedtime) Leflunomide 20 mg tablet- Take one tablet by mouth once daily (Bedtime) Montelukast (Singulair) 10 mg tablet 1 tablet daily (Bedtime) Breztri Inhaler 160-9-4.8 mcg 2 puffs in twice daily  Patient declined medications last month: Albuterol 90 mcg 2 puffs every 6 hours prn - PRN Medication patient has ample supply at this time  Patient is due for next adherence delivery on: 03/27/2022 (Friday) [facility]  Called patient and reviewed medications and coordinated delivery.  This delivery to include: Meloxicam 15 mg tablet- Take one tablet by mouth once daily (Bedtime) Ezetimibe (Zetia) 10 mg tablet Take one tablet by mouth daily (Breakfast) Atorvastatin 40 mg tablet- Take one tablet by mouth daily (Breakfast) Carvedilol 12.5 mg tablet - Take on tablet by mouth twice daily (Breakfast, Bedtime) Mirtazapine 7.5 mg tablet-Take one  tablet daily (Bedtime) Topiramate 25 mg tablet-Take one tablet by mouth twice daily (Breakfast, Bedtime) Gabapentin 400 mg tablet- Take one tablet by mouth twice daily (Breakfast, Bedtime) Linzess 145 mcg- Take one tablet by mouth daily (Before Breakfast) (Vials) Venlafaxine ER 75 mg capsule- Take three capsules by mouth daily (Breakfast) Omeprazole 40 mg- Take one capsule by mouth two times a day (Breakfast, Bedtime) Potassium 20 mEq - Take one capsule daily (Breakfast) Aripiprazole 10 mg tablet- Take one tablet by mouth daily (Breakfast) Leflunomide 20 mg tablet- Take one tablet by mouth once daily (Bedtime) Montelukast (Singulair) 10 mg tablet 1 tablet daily (Bedtime) Breztri Inhaler 160-9-4.8 mcg 2 puffs in twice daily  Patient declined the following medications for the month of August: Albuterol 90 mcg 2 puffs every 6 hours prn - PRN Medication patient has ample supply at this time  Patient needs refills for Mirtazapine 7.5 mg and Leflunomide 20 mg both are specialist medications. [facility] should have sent over the refill request.  Confirmed delivery date of 03/27/2022 [address], advised patient that pharmacy will contact them the morning of delivery.  I spoke with the patient and she reports she is doing okay. Patient denies any ill symptoms at  this time. Patient confirms that she did receive her O2 and is now using 2L at bedtime. Per patient she feels like it is helping. Patient has no other concerns or issues today.  Patient has a telephone follow-up appointment with Junius Argyle, CPP on 07/30/2022 @ 1100.  Lynann Bologna, CPA/CMA Clinical Pharmacist Assistant Phone: [phone]

## 2022-03-25 ENCOUNTER — Other Ambulatory Visit: Payer: Self-pay | Admitting: Psychiatry

## 2022-03-25 DIAGNOSIS — F5101 Primary insomnia: Secondary | ICD-10-CM

## 2022-03-25 DIAGNOSIS — F411 Generalized anxiety disorder: Secondary | ICD-10-CM

## 2022-03-27 ENCOUNTER — Telehealth: Payer: Self-pay

## 2022-03-27 NOTE — Telephone Encounter (Signed)
Copied from [place] [id number]. Topic: General - Other >> Mar 27, 2022  1:39 PM [place] B wrote: Reason for ETK:[id number] wants Dr Thedore Mins to be her primary Dr instead of Dr Rollene Rotunda, because she says their personalities crash.

## 2022-03-31 ENCOUNTER — Ambulatory Visit (INDEPENDENT_AMBULATORY_CARE_PROVIDER_SITE_OTHER): Payer: Medicare Other | Admitting: Psychiatry

## 2022-03-31 ENCOUNTER — Encounter: Payer: Self-pay | Admitting: Psychiatry

## 2022-03-31 VITALS — BP 141/80 | HR 87 | Temp 98.3°F | Wt 173.2 lb

## 2022-03-31 DIAGNOSIS — F411 Generalized anxiety disorder: Secondary | ICD-10-CM

## 2022-03-31 DIAGNOSIS — F5101 Primary insomnia: Secondary | ICD-10-CM

## 2022-03-31 DIAGNOSIS — F1021 Alcohol dependence, in remission: Secondary | ICD-10-CM | POA: Diagnosis not present

## 2022-03-31 DIAGNOSIS — F3342 Major depressive disorder, recurrent, in full remission: Secondary | ICD-10-CM | POA: Diagnosis not present

## 2022-03-31 MED ORDER — ARIPIPRAZOLE 5 MG PO TABS: 5.0000 mg | ORAL_TABLET | Freq: Every day | ORAL | 1 refills | Status: DC

## 2022-03-31 MED ORDER — MIRTAZAPINE 15 MG PO TABS: 15.0000 mg | ORAL_TABLET | Freq: Every day | ORAL | 0 refills | Status: DC

## 2022-03-31 MED ORDER — VENLAFAXINE HCL ER 75 MG PO CP24: 225.0000 mg | ORAL_CAPSULE | Freq: Every day | ORAL | 1 refills | Status: DC

## 2022-03-31 NOTE — Patient Instructions (Signed)
Insomnia Insomnia is a sleep disorder that makes it difficult to fall asleep or stay asleep. Insomnia can cause fatigue, low energy, difficulty concentrating, mood swings, and poor performance at work or school. There are three different ways to classify insomnia: Difficulty falling asleep. Difficulty staying asleep. Waking up too early in the morning. Any type of insomnia can be long-term (chronic) or short-term (acute). Both are common. Short-term insomnia usually lasts for 3 months or less. Chronic insomnia occurs at least three times a week for longer than 3 months. What are the causes? Insomnia may be caused by another condition, situation, or substance, such as: Having certain mental health conditions, such as anxiety and depression. Using caffeine, alcohol, tobacco, or drugs. Having gastrointestinal conditions, such as gastroesophageal reflux disease (GERD). Having certain medical conditions. These include: Asthma. Alzheimer's disease. Stroke. Chronic pain. An overactive thyroid gland (hyperthyroidism). Other sleep disorders, such as restless legs syndrome and sleep apnea. Menopause. Sometimes, the cause of insomnia may not be known. What increases the risk? Risk factors for insomnia include: Gender. Females are affected more often than males. Age. Insomnia is more common as people get older. Stress and certain medical and mental health conditions. Lack of exercise. Having an irregular work schedule. This may include working night shifts and traveling between different time zones. What are the signs or symptoms? If you have insomnia, the main symptom is having trouble falling asleep or having trouble staying asleep. This may lead to other symptoms, such as: Feeling tired or having low energy. Feeling nervous about going to sleep. Not feeling rested in the morning. Having trouble concentrating. Feeling irritable, anxious, or depressed. How is this diagnosed? This condition  may be diagnosed based on: Your symptoms and medical history. Your health care provider may ask about: Your sleep habits. Any medical conditions you have. Your mental health. A physical exam. How is this treated? Treatment for insomnia depends on the cause. Treatment may focus on treating an underlying condition that is causing the insomnia. Treatment may also include: Medicines to help you sleep. Counseling or therapy. Lifestyle adjustments to help you sleep better. Follow these instructions at home: Eating and drinking  Limit or avoid alcohol, caffeinated beverages, and products that contain nicotine and tobacco, especially close to bedtime. These can disrupt your sleep. Do not eat a large meal or eat spicy foods right before bedtime. This can lead to digestive discomfort that can make it hard for you to sleep. Sleep habits  Keep a sleep diary to help you and your health care provider figure out what could be causing your insomnia. Write down: When you sleep. When you wake up during the night. How well you sleep and how rested you feel the next day. Any side effects of medicines you are taking. What you eat and drink. Make your bedroom a dark, comfortable place where it is easy to fall asleep. Put up shades or blackout curtains to block light from outside. Use a white noise machine to block noise. Keep the temperature cool. Limit screen use before bedtime. This includes: Not watching TV. Not using your smartphone, tablet, or computer. Stick to a routine that includes going to bed and waking up at the same times every day and night. This can help you fall asleep faster. Consider making a quiet activity, such as reading, part of your nighttime routine. Try to avoid taking naps during the day so that you sleep better at night. Get out of bed if you are still awake after   15 minutes of trying to sleep. Keep the lights down, but try reading or doing a quiet activity. When you feel  sleepy, go back to bed. General instructions Take over-the-counter and prescription medicines only as told by your health care provider. Exercise regularly as told by your health care provider. However, avoid exercising in the hours right before bedtime. Use relaxation techniques to manage stress. Ask your health care provider to suggest some techniques that may work well for you. These may include: Breathing exercises. Routines to release muscle tension. Visualizing peaceful scenes. Make sure that you drive carefully. Do not drive if you feel very sleepy. Keep all follow-up visits. This is important. Contact a health care provider if: You are tired throughout the day. You have trouble in your daily routine due to sleepiness. You continue to have sleep problems, or your sleep problems get worse. Get help right away if: You have thoughts about hurting yourself or someone else. Get help right away if you feel like you may hurt yourself or others, or have thoughts about taking your own life. Go to your nearest emergency room or: Call 911. Call the National Suicide Prevention Lifeline at 1-800-273-8255 or 988. This is open 24 hours a day. Text the Crisis Text Line at 741741. Summary Insomnia is a sleep disorder that makes it difficult to fall asleep or stay asleep. Insomnia can be long-term (chronic) or short-term (acute). Treatment for insomnia depends on the cause. Treatment may focus on treating an underlying condition that is causing the insomnia. Keep a sleep diary to help you and your health care provider figure out what could be causing your insomnia. This information is not intended to replace advice given to you by your health care provider. Make sure you discuss any questions you have with your health care provider. Document Revised: 07/14/2021 Document Reviewed: 07/14/2021 Elsevier Patient Education  2023 Elsevier Inc.  

## 2022-03-31 NOTE — Progress Notes (Signed)
[facility] MD OP Progress Note  03/31/2022 11:35 AM Gabrielle Pineda  MRN:  [id number]  Chief Complaint:  Chief Complaint  Patient presents with   Follow-up: 66 year old Caucasian female with history of depression, anxiety, chronic pain, sleep problems presented for medication management.   HPI: Gabrielle Pineda is a 66 year old Caucasian female, married, on disability, lives in [place], has a history of depression, primary insomnia, COPD, gastroesophageal reflux disease, hypertension, heart murmur, history of back pain with history of spinal cord stimulator, paroxetine ventricular tachycardia, hypercholesterolemia, chronic pain syndrome presented for medication management.   Patient today reports sleep has improved on the mirtazapine.  She currently sleeps around 5 hours.  Patient reports she goes to bed at around 11 PM.  She watches TV prior to that.  She is able to sleep until around 5 AM.  Denies any side effects to mirtazapine.  Denies any significant depression, sadness, anhedonia.  Denies any anxiety symptoms.  Continues to be compliant on the Abilify, venlafaxine.  Denies side effects.  Patient denies any suicidality, homicidality or perceptual disturbances.  Reports history of fatigue although improving .Does have a history of macrocytic anemia recently had a visit with oncology.  Currently under their care.  Patient denies any other concerns today.  Visit Diagnosis:    ICD-10-CM   1. Primary insomnia  F51.01 mirtazapine (REMERON) 15 MG tablet    2. MDD (major depressive disorder), recurrent, in full remission ([place])  F33.42 ARIPiprazole (ABILIFY) 5 MG tablet    venlafaxine XR (EFFEXOR-XR) 75 MG 24 hr capsule    mirtazapine (REMERON) 15 MG tablet    3. GAD (generalized anxiety disorder)  F41.1 venlafaxine XR (EFFEXOR-XR) 75 MG 24 hr capsule    mirtazapine (REMERON) 15 MG tablet    4. Alcohol use disorder, moderate, in sustained remission ([place])  F10.21       Past Psychiatric  History: Reviewed past psychiatric history from progress note on 12/26/2021.  Past trials of medications like Prozac, sertraline, multiple other medications.  Does not remember all the names.  Past Medical History:  Past Medical History:  Diagnosis Date   Alcohol abuse    Anemia    Anxiety    Arthritis    Cervicalgia    Cirrhosis ([place]) 1994   COPD (chronic obstructive pulmonary disease) ([place])    Depression    Difficult intubation    has plates and screws in neck   Dyspnea    GERD (gastroesophageal reflux disease)    Headache    Heart murmur    on heard when pt is lying   Hypertension    Other and unspecified hyperlipidemia    Status post insertion of spinal cord stimulator    Tachycardia    d/t questionable anxiety happens every 5- 10 years, sees [facility]    Past Surgical History:  Procedure Laterality Date   ANTERIOR CERVICAL DECOMP/DISCECTOMY FUSION  2012, 2015, 2018   x3   AUGMENTATION MAMMAPLASTY Bilateral [place] RELEASE  11/11/2011   Procedure: CARPAL TUNNEL RELEASE;  Surgeon: Floyce Stakes, MD;  Location: [facility]UR[facility]S;  Service: Neurosurgery;  Laterality: Right;  Right Median Nerve Decompression   CARPAL TUNNEL RELEASE  2013   COLONOSCOPY WITH PROPOFOL N/A 11/01/2017   Procedure: COLONOSCOPY WITH PROPOFOL;  Surgeon: Manya Silvas, MD;  Location: [facility] ENDOSCOPY;  Service: Endoscopy;  Laterality: N/A;   ESOPHAGOGASTRODUODENOSCOPY     LUMBAR LAMINECTOMY/DECOMPRESSION MICRODISCECTOMY N/A 08/15/2019  Procedure: Lumbar three to Sacral one Decompressive lumbar laminectomy;  Surgeon: Erline Levine, MD;  Location: [facility];  Service: Neurosurgery;  Laterality: N/A;   neck disc surgery     plates and screws in neck x 2   ROTATOR CUFF REPAIR  06/16/2016   right shoulder    ROTATOR CUFF REPAIR     SPINAL CORD STIMULATOR INSERTION N/A 03/17/2021   Procedure: THORACIC SPINAL CORD STIMULATOR (PERCUTANEOUS) & PULSE  GENERATOR PLACEMENT;  Surgeon: [name], MD;  Location: [facility] ORS;  Service: Neurosurgery;  Laterality: N/A;   TONSILLECTOMY AND ADENOIDECTOMY  1973    TOTAL HIP ARTHROPLASTY Right 04/15/2021   Procedure: TOTAL HIP ARTHROPLASTY ANTERIOR APPROACH;  Surgeon: Hessie Knows, MD;  Location: [facility] ORS;  Service: Orthopedics;  Laterality: Right;    Family Psychiatric History: Reviewed family psychiatric history from progress note on 12/26/2021.  Family History:  Family History  Problem Relation Age of Onset   Alcohol abuse Mother    Bipolar disorder Mother    Suicidality Mother    Pancreatic cancer Father    Alcohol abuse Father    Drug abuse Sister    Alcohol abuse Sister    Depression Sister    Anesthesia problems Neg Hx    Breast cancer Neg Hx     Social History: Reviewed social history from progress note on 12/26/2021. Social History   Socioeconomic History   Marital status: Married    Spouse name: jerry   Number of children: 0   Years of education: Not on file   Highest education level: Bachelor's degree (e.g., BA, AB, BS)  Occupational History   Not on file  Tobacco Use   Smoking status: Former    Packs/day: 2.00    Years: 40.00    Total pack years: 80.00    Types: Cigarettes    Quit date: 10/30/2020    Years since quitting: 1.4   Smokeless tobacco: Never  Vaping Use   Vaping Use: Former   Devices: vape occasionally  Substance and Sexual Activity   Alcohol use: No    Comment: recovering alcoholic Since [id number]    Drug use: No   Sexual activity: Not Currently  Other Topics Concern   Not on file  Social History Narrative   Married; full time; does not get regular exercise.    Social Determinants of Health   Financial Resource Strain: Low Risk  (11/03/2021)   Overall Financial Resource Strain (CARDIA)    Difficulty of Paying Living Expenses: Not very hard  Food Insecurity: No Food Insecurity (11/03/2021)   Hunger Vital Sign    Worried About Running Out of Food in  the Last Year: Never true    Ran Out of Food in the Last Year: Never true  Transportation Needs: No Transportation Needs (11/03/2021)   PRAPARE - Hydrologist (Medical): No    Lack of Transportation (Non-Medical): No  Physical Activity: Inactive (11/03/2021)   Exercise Vital Sign    Days of Exercise per Week: 0 days    Minutes of Exercise per Session: 0 min  Stress: Stress Concern Present (11/03/2021)   [place]    Feeling of Stress : To some extent  Social Connections: Moderately Isolated (11/03/2021)   Social Connection and Isolation Panel [NHANES]    Frequency of Communication with Friends and Family: More than three times a week    Frequency of Social Gatherings with Friends and Family: More than  three times a week    Attends Religious Services: Never    Active Member of Clubs or Organizations: No    Attends Archivist Meetings: Never    Marital Status: Married    Allergies:  Allergies  Allergen Reactions   Plaquenil [Hydroxychloroquine] Rash    Metabolic Disorder Labs: Lab Results  Component Value Date   HGBA1C 5.3 06/26/2021   No results found for: "PROLACTIN" Lab Results  Component Value Date   CHOL 153 11/17/2021   TRIG 202 (H) 11/17/2021   HDL 40 11/17/2021   CHOLHDL 3.8 11/17/2021   LDLCALC 79 11/17/2021   LDLCALC 50 06/26/2021   Lab Results  Component Value Date   TSH 3.673 03/10/2022   TSH 2.170 11/17/2021    Therapeutic Level Labs: No results found for: "LITHIUM" No results found for: "VALPROATE" No results found for: "CBMZ"  Current Medications: Current Outpatient Medications  Medication Sig Dispense Refill   acyclovir ointment (ZOVIRAX) 5 % SMARTSIG:Topical Every 3 Hours     albuterol (PROVENTIL) (2.5 MG/3ML) 0.083% nebulizer solution Take 3 mLs (2.5 mg total) by nebulization every 4 (four) hours as needed for wheezing or shortness of breath.  225 mL 2   albuterol (VENTOLIN HFA) 108 (90 Base) MCG/ACT inhaler INHALE TWO PUFFS BY MOUTH INTO LUNGS every SIX hours AS NEEDED FOR WHEEZING AND/OR SHORTNESS OF BREATH 8.5 g 2   ARIPiprazole (ABILIFY) 5 MG tablet Take 1 tablet (5 mg total) by mouth daily. 30 tablet 1   atorvastatin (LIPITOR) 40 MG tablet Take 1 tablet (40 mg total) by mouth every morning. 90 tablet 3   Budeson-Glycopyrrol-Formoterol (BREZTRI AEROSPHERE) 160-9-4.8 MCG/ACT AERO Inhale 2 puffs into the lungs in the morning and at bedtime. 10.7 g 11   carvedilol (COREG) 12.5 MG tablet TAKE ONE TABLET BY MOUTH EVERY MORNING and TAKE ONE TABLET BY MOUTH EVERYDAY AT BEDTIME 180 tablet 3   ezetimibe (ZETIA) 10 MG tablet Take 1 tablet (10 mg total) by mouth every morning. 90 tablet 3   gabapentin (NEURONTIN) 400 MG capsule Take 1 capsule (400 mg total) by mouth 2 (two) times daily. 60 capsule 5   leflunomide (ARAVA) 20 MG tablet Take 20 mg by mouth at bedtime.     LINZESS 145 MCG CAPS capsule Take 145 mcg by mouth daily before breakfast.     meloxicam (MOBIC) 15 MG tablet Take 15 mg by mouth at bedtime.     mirtazapine (REMERON) 15 MG tablet Take 1 tablet (15 mg total) by mouth at bedtime. Dose change 90 tablet 0   montelukast (SINGULAIR) 10 MG tablet Take 1 tablet (10 mg total) by mouth at bedtime. 30 tablet 11   Multiple Vitamin (MULTIVITAMIN WITH MINERALS) TABS tablet Take 1 tablet by mouth daily.     omeprazole (PRILOSEC) 40 MG capsule Take 1 capsule (40 mg total) by mouth 2 (two) times daily. 180 capsule 1   oxyCODONE-acetaminophen (PERCOCET) 5-325 MG tablet Take 1 tablet by mouth every 8 (eight) hours as needed for severe pain. Must last 30 days. 90 tablet 0   potassium chloride SA (KLOR-CON M) 20 MEQ tablet Take 1 tablet (20 mEq total) by mouth daily with breakfast. 90 tablet 1   sulfaSALAzine (AZULFIDINE) 500 MG tablet Take 1,000 mg by mouth 2 (two) times daily.     topiramate (TOPAMAX) 25 MG tablet Take 1 tablet (25 mg total) by  mouth 2 (two) times daily. 60 tablet 11   valACYclovir (VALTREX) 1000 MG tablet TAKE TWO  TABLETS BY MOUTH twice daily FOR 5 DAYS AS NEEDED FOR FEVER blisters 30 tablet 0   XIIDRA 5 % SOLN Place 1 drop into both eyes daily.     venlafaxine XR (EFFEXOR-XR) 75 MG 24 hr capsule Take 3 capsules (225 mg total) by mouth daily with breakfast. TAKE 3 CAPSULES BY MOUTH ONCE DAILY WITH BREAKFAST 270 capsule 1   No current facility-administered medications for this visit.     Musculoskeletal: Strength & Muscle Tone: within normal limits Gait & Station: normal Patient leans: N/A  Psychiatric Specialty Exam: Review of Systems  Constitutional:  Positive for fatigue.  Musculoskeletal:  Positive for arthralgias.  Psychiatric/Behavioral:  Positive for sleep disturbance.   All other systems reviewed and are negative.   Blood pressure (!) 141/80, pulse 87, temperature 98.3 F (36.8 C), temperature source Temporal, weight 173 lb 3.2 oz (78.6 kg).Body mass index is 25.58 kg/m.  General Appearance: Fairly Groomed  Eye Contact:  Fair  Speech:  Clear and Coherent  Volume:  Normal  Mood:  Euthymic  Affect:  Congruent  Thought Process:  Goal Directed and Descriptions of Associations: Intact  Orientation:  Full (Time, Place, and Person)  Thought Content: Logical   Suicidal Thoughts:  No  Homicidal Thoughts:  No  Memory:  Immediate;   Fair Recent;   Fair Remote;   Fair  Judgement:  Fair  Insight:  Fair  Psychomotor Activity:  Normal  Concentration:  Concentration: Fair and Attention Span: Fair  Recall:  [employer] of Knowledge: Fair  Language: Fair  Akathisia:  No  Handed:  Right  AIMS (if indicated): done  Assets:  Communication Skills Desire for Improvement Housing Social Support Transportation  ADL's:  Intact  Cognition: WNL  Sleep:   restless   Screenings: [place] Office Visit from 03/31/2022 in [facility] Office Visit from 01/22/2022 in  [facility] Office Visit from 12/26/2021 in [facility] Total Score 0 0 0      GAD-7    [place] Visit from 03/31/2022 in [facility] Office Visit from 01/22/2022 in [facility] Office Visit from 12/26/2021 in [facility] Visit from 12/04/2019 in [facility]  Total GAD-7 Score 2 0 0 9      PHQ2-9    [place] Visit from 03/31/2022 in [facility] Office Visit from 01/22/2022 in [facility] Office Visit from 01/15/2022 in [facility] Office Visit from 12/26/2021 in [facility] Office Visit from 11/17/2021 in [facility]  PHQ-2 Total Score 0 0 0 0 0  PHQ-9 Total Score 2 2 -- 3 6      [place] Visit from 03/31/2022 in [facility] Office Visit from 01/22/2022 in [facility] Office Visit from 12/26/2021 in [facility] No Risk No Risk        Assessment and Plan: Gabrielle Pineda is a 66 year old Caucasian female, married, on disability, lives in [place], has a history of GAD, MDD, sleep problems, COPD, gastroesophageal reflux disease, hypertension, heart murmur, history of back pain with history of spinal cord stimulator, paroxysmal supraventricular tachycardia, hypercholesterolemia, chronic pain syndrome, microcytic anemia currently under the care of oncology was evaluated in office today.  Patient continues to struggle with sleep although improving, will  benefit from the following plan.  Plan  MDD in full remission Venlafaxine 225 mg p.o. daily with breakfast Abilify-dose reduced to 5 mg p.o. daily in the morning.  GAD-stable Venlafaxine  225 mg p.o. daily  Primary insomnia-improving Increase mirtazapine to 15 mg p.o. nightly Since mirtazapine dosage is being increased Abilify dosage has been reduced to a 5 mg p.o. daily as noted above. Provided medication education.  Alcohol use disorder in remission Sober since 1994.  Reviewed notes per oncology-Dr. Yu-currently under the care for macrocytic anemia.  Follow-up in clinic in 4 weeks or sooner if needed.   This note was generated in part or whole with voice recognition software. Voice recognition is usually quite accurate but there are transcription errors that can and very often do occur. I apologize for any typographical errors that were not detected and corrected.    Ursula Alert, MD 03/31/2022, 9:29 PM

## 2022-04-06 ENCOUNTER — Inpatient Hospital Stay: Payer: Medicare Other | Attending: Oncology | Admitting: Oncology

## 2022-04-06 ENCOUNTER — Encounter: Payer: Self-pay | Admitting: Oncology

## 2022-04-06 VITALS — Temp 98.9°F

## 2022-04-06 DIAGNOSIS — D539 Nutritional anemia, unspecified: Secondary | ICD-10-CM | POA: Insufficient documentation

## 2022-04-06 DIAGNOSIS — Z79899 Other long term (current) drug therapy: Secondary | ICD-10-CM | POA: Diagnosis not present

## 2022-04-06 DIAGNOSIS — M069 Rheumatoid arthritis, unspecified: Secondary | ICD-10-CM

## 2022-04-06 DIAGNOSIS — M0579 Rheumatoid arthritis with rheumatoid factor of multiple sites without organ or systems involvement: Secondary | ICD-10-CM | POA: Diagnosis not present

## 2022-04-06 DIAGNOSIS — F17290 Nicotine dependence, other tobacco product, uncomplicated: Secondary | ICD-10-CM | POA: Insufficient documentation

## 2022-04-06 DIAGNOSIS — J449 Chronic obstructive pulmonary disease, unspecified: Secondary | ICD-10-CM | POA: Diagnosis not present

## 2022-04-06 MED ORDER — AMOXICILLIN-POT CLAVULANATE 875-125 MG PO TABS: 1.0000 | ORAL_TABLET | Freq: Two times a day (BID) | ORAL | 0 refills | Status: DC

## 2022-04-06 NOTE — Progress Notes (Unsigned)
Pt here for follow up. No new concerns voiced.   

## 2022-04-07 ENCOUNTER — Ambulatory Visit
Payer: Medicare Other | Attending: Student in an Organized Health Care Education/Training Program | Admitting: Student in an Organized Health Care Education/Training Program

## 2022-04-07 ENCOUNTER — Encounter: Payer: Self-pay | Admitting: Student in an Organized Health Care Education/Training Program

## 2022-04-07 VITALS — BP 175/100 | HR 82 | Temp 96.9°F | Resp 16 | Ht 69.0 in | Wt 173.0 lb

## 2022-04-07 DIAGNOSIS — G629 Polyneuropathy, unspecified: Secondary | ICD-10-CM | POA: Diagnosis not present

## 2022-04-07 DIAGNOSIS — M961 Postlaminectomy syndrome, not elsewhere classified: Secondary | ICD-10-CM | POA: Diagnosis not present

## 2022-04-07 DIAGNOSIS — M5416 Radiculopathy, lumbar region: Secondary | ICD-10-CM | POA: Diagnosis not present

## 2022-04-07 DIAGNOSIS — G894 Chronic pain syndrome: Secondary | ICD-10-CM | POA: Diagnosis not present

## 2022-04-07 DIAGNOSIS — M5030 Other cervical disc degeneration, unspecified cervical region: Secondary | ICD-10-CM | POA: Insufficient documentation

## 2022-04-07 DIAGNOSIS — M47812 Spondylosis without myelopathy or radiculopathy, cervical region: Secondary | ICD-10-CM | POA: Diagnosis not present

## 2022-04-07 MED ORDER — OXYCODONE-ACETAMINOPHEN 5-325 MG PO TABS: 1.0000 | ORAL_TABLET | Freq: Three times a day (TID) | ORAL | 0 refills | Status: DC | PRN

## 2022-04-07 NOTE — Assessment & Plan Note (Signed)
Chronic normocytic anemia Labs reviewed and discussed with patient. Normal iron panel, B12 and folate, normal TSH.  Negative multiple myeloma panel, light chain ratio.  Normal LDH. Anemia could be secondary to chronic inflammation/autoimmune disease.  Other differential includes underlying bone marrow disease, i.e. MDS.  Plan to repeat blood work in 2 months to further trend level.  If progressively decreasing, I would recommend a bone marrow biopsy. 

## 2022-04-07 NOTE — Progress Notes (Signed)
Nursing Pain Medication Assessment:  Safety precautions to be maintained throughout the outpatient stay will include: orient to surroundings, keep bed in low position, maintain call bell within reach at all times, provide assistance with transfer out of bed and ambulation.  Medication Inspection Compliance: Pill count conducted under aseptic conditions, in front of the patient. Neither the pills nor the bottle was removed from the patient's sight at any time. Once count was completed pills were immediately returned to the patient in their original bottle.  Medication: Oxycodone/APAP Pill/Patch Count:  76 of 90 pills remain Pill/Patch Appearance: Markings consistent with prescribed medication Bottle Appearance: Standard pharmacy container. Clearly labeled. Filled Date: 08/11 / 2023 Last Medication intake:  Today

## 2022-04-07 NOTE — Progress Notes (Signed)
PROVIDER NOTE: Information contained herein reflects review and annotations entered in association with encounter. Interpretation of such information and data should be left to medically-trained personnel. Information provided to patient can be located elsewhere in the medical record under "Patient Instructions". Document created using STT-dictation technology, any transcriptional errors that may result from process are unintentional.    Patient: Gabrielle Pineda  Service Category: E/M  Provider: [name], Pineda  DOB: 04-19-1956  DOS: 04/07/2022  Specialty: Interventional Pain Management  MRN: [id number]  Setting: Ambulatory outpatient  PCP: Gabrielle Kirschner, PA-C  Type: Established Patient    Referring Provider: [place], Pineda*  Location: Office  Delivery: Face-to-face     HPI  [name], a 66 y.o. year old female, is here today because of her Spondylosis of cervical region without myelopathy or radiculopathy [M47.812]. [name] primary complain today is cervical spine pain, low back pain, hip pain  Last encounter: My last encounter with her was on 01/15/22   Pertinent problems: [name] has Closed compression fracture of L5 lumbar vertebra; Cervical radiculopathy; Neuropathy; Spinal stenosis, lumbar region, with neurogenic claudication; Spondylosis of cervical region without myelopathy or radiculopathy; Cervical fusion syndrome; Chronic pain syndrome; Postlaminectomy syndrome, lumbar region; Chronic radicular lumbar pain; and Status post total hip replacement, right on their pertinent problem list. Pain Assessment: Severity of Chronic pain is reported as a 6 /10. Location: Back Lower/denies. Onset: More than a month ago. Quality: Stabbing. Timing: Constant. Modifying factor(s): meds. Vitals:  height is 5' 9"  (1.753 m) and weight is 173 lb (78.5 kg). Her temperature is 96.9 F (36.1 C) (abnormal). Her blood pressure is 175/100 (abnormal) and her pulse is 82. Her  respiration is 16 and oxygen saturation is 97%.   Reason for encounter:   No change in medical history since last visit.  Patient's pain is at baseline.  Patient continues multimodal pain regimen as prescribed.  States that it provides pain relief and improvement in functional status.   ROS  Constitutional: Denies any fever or chills Gastrointestinal: No reported hemesis, hematochezia, vomiting, or acute GI distress Musculoskeletal:  Low back, right hip pain Neurological: No reported episodes of acute onset apraxia, aphasia, dysarthria, agnosia, amnesia, paralysis, loss of coordination, or loss of consciousness  Medication Review  ARIPiprazole, Budeson-Glycopyrrol-Formoterol, Lifitegrast, acyclovir ointment, albuterol, amoxicillin-clavulanate, atorvastatin, carvedilol, ezetimibe, gabapentin, leflunomide, linaclotide, meloxicam, mirtazapine, montelukast, multivitamin with minerals, omeprazole, oxyCODONE-acetaminophen, potassium chloride SA, sulfaSALAzine, topiramate, valACYclovir, and venlafaxine XR  History Review  Allergy: [name] is allergic to plaquenil [hydroxychloroquine]. Drug: [name]  reports no history of drug use. Alcohol:  reports no history of alcohol use. Tobacco:  reports that she quit smoking about 17 months ago. Her smoking use included cigarettes. She has a 80.00 pack-year smoking history. She has never used smokeless tobacco. Social: [name]  reports that she quit smoking about 17 months ago. Her smoking use included cigarettes. She has a 80.00 pack-year smoking history. She has never used smokeless tobacco. She reports that she does not drink alcohol and does not use drugs. Medical:  has a past medical history of Alcohol abuse, Anemia, Anxiety, Arthritis, Cervicalgia, Cirrhosis ([place]) (1994), COPD (chronic obstructive pulmonary disease) ([place]), Depression, Difficult intubation, Dyspnea, GERD (gastroesophageal reflux disease), Headache, Heart murmur,  Hypertension, Other and unspecified hyperlipidemia, Status post insertion of spinal cord stimulator, and Tachycardia. Surgical: Gabrielle Pineda  has a past surgical history that includes neck disc surgery; Breast enhancement surgery (1981); Esophagogastroduodenoscopy; Tonsillectomy and adenoidectomy (1973 ); Carpal tunnel  release (11/11/2011); Carpal tunnel release (2013); Rotator cuff repair (06/16/2016); Rotator cuff repair; Colonoscopy with propofol (N/A, 11/01/2017); Augmentation mammaplasty (Bilateral, 1982); Anterior cervical decomp/discectomy fusion (2012, 2015, 2018); Lumbar laminectomy/decompression microdiscectomy (N/A, 08/15/2019); Spinal cord stimulator insertion (N/A, 03/17/2021); and Total hip arthroplasty (Right, 04/15/2021). Family: family history includes Alcohol abuse in her father, mother, and sister; Bipolar disorder in her mother; Depression in her sister; Drug abuse in her sister; Pancreatic cancer in her father; Suicidality in her mother.  Laboratory Chemistry Profile   Renal Lab Results  Component Value Date   BUN 11 03/10/2022   CREATININE 1.02 (H) 03/10/2022   BCR 5 (L) 11/17/2021   GFRAA 72 05/06/2020   GFRNONAA >60 03/10/2022    Hepatic Lab Results  Component Value Date   AST 22 03/10/2022   ALT 14 03/10/2022   ALBUMIN 3.8 03/10/2022   ALKPHOS 103 03/10/2022   LIPASE 63 03/23/2018    Electrolytes Lab Results  Component Value Date   NA 133 (L) 03/10/2022   K 3.5 03/10/2022   CL 109 03/10/2022   CALCIUM 8.6 (L) 03/10/2022   PHOS 3.2 05/06/2020    Bone No results found for: "VD25OH", "VD125OH2TOT", "CV8938BO1", "BP1025EN2", "25OHVITD1", "25OHVITD2", "25OHVITD3", "TESTOFREE", "TESTOSTERONE"  Inflammation (CRP: Acute Phase) (ESR: Chronic Phase) No results found for: "CRP", "ESRSEDRATE", "LATICACIDVEN"       Note: Above Lab results reviewed.  Recent Imaging Review  MM 3D SCREEN BREAST W/IMPLANT BILATERAL CLINICAL DATA:  Screening.  EXAM: DIGITAL SCREENING  BILATERAL MAMMOGRAM WITH IMPLANTS, CAD AND TOMOSYNTHESIS  TECHNIQUE: Bilateral screening digital craniocaudal and mediolateral oblique mammograms were obtained. Bilateral screening digital breast tomosynthesis was performed. The images were evaluated with computer-aided detection. Standard and/or implant displaced views were performed.  COMPARISON:  Previous exam(s).  ACR Breast Density Category b: There are scattered areas of fibroglandular density.  FINDINGS: The patient has retropectoral implants. There are no findings suspicious for malignancy.  IMPRESSION: No mammographic evidence of malignancy. A result letter of this screening mammogram will be mailed directly to the patient.  RECOMMENDATION: Screening mammogram in one year. (Code:SM-B-01Y)  BI-RADS CATEGORY  1:  Negative.  Electronically Signed   By: Nolon Nations M.D.   On: 12/23/2021 16:34 DG Bone Density EXAM: DUAL X-RAY ABSORPTIOMETRY (DXA) FOR BONE MINERAL DENSITY  IMPRESSION: Your patient Gabrielle Pineda completed a BMD test on 12/23/2021 using the [place] (software version: 14.10) manufactured by [employer]. The following summarizes the results of our evaluation. Technologist: SCE/MM PATIENT BIOGRAPHICAL: Name: Gabrielle Pineda, Gabrielle Pineda Patient ID: [id number] Birth Date: 16-Jul-1956 Height: 66.0 in. Gender: Female Exam Date: 12/23/2021 Weight: 178.2 lbs. Indications: COPD, History of Fracture (Adult), neuropathy, Osteoarthritis, Postmenopausal, Previous Smoker, Rheumatoid Arthritis Fractures: Left wrist Treatments: Albuterol, Atrovent, Breztri, Gabapentin, meloxicam, Multi-Vitamin, Prilosec, Singulair  DENSITOMETRY RESULTS: Site          Region     Measured Date Measured Age WHO Classification Young Adult T-score BMD         %Change vs. Previous Significant Change (*) AP Spine L3-L4 12/23/2021 65.3 Osteopenia -1.2 1.070 g/cm2 - -  Left Femur Total 12/23/2021 65.3  Osteopenia -1.3 0.844 g/cm2 - -  Right Forearm Radius 33% 12/23/2021 65.3 Osteopenia -1.3 0.766 g/cm2 - -  ASSESSMENT: The BMD measured at Forearm Radius 33% is 0.766 g/cm2 with a T-score of -1.3. This patient is considered osteopenic according to [facility]) criteria. L1 and L2 was excluded due to degenerative changes. The scan quality is good. Right femur  was excluded due to surgical hardware.  World Pharmacologist [facility]) criteria for post-menopausal, Caucasian Women: Normal:                   T-score at or above -1 SD Osteopenia/low bone mass: T-score between -1 and -2.5 SD Osteoporosis:             T-score at or below -2.5 SD  RECOMMENDATIONS: 1. All patients should optimize calcium and vitamin D intake. 2. Consider FDA-approved medical therapies in postmenopausal women and men aged 20 years and older, based on the following: a. A hip or vertebral(clinical or morphometric) fracture b. T-score < -2.5 at the femoral neck or spine after appropriate evaluation to exclude secondary causes c. Low bone mass (T-score between -1.0 and -2.5 at the femoral neck or spine) and a 10-year probability of a hip fracture > 3% or a 10-year probability of a major osteoporosis-related fracture > 20% based on the [place]-adapted WHO algorithm 3. Clinician judgment and/or patient preferences may indicate treatment for people with 10-year fracture probabilities above or below these levels  FOLLOW-UP: People with diagnosed cases of osteoporosis or at high risk for fracture should have regular bone mineral density tests. For patients eligible for Medicare, routine testing is allowed once every 2 years. The testing frequency can be increased to one year for patients who have rapidly progressing disease, those who are receiving or discontinuing medical therapy to restore bone mass, or have additional risk factors.  Pineda have reviewed this report, and agree with the above  findings. Mark A. Thornton Papas, M.D. [facility]  Dear Gabrielle Pineda,  Your patient Gabrielle Pineda completed a FRAX assessment on 12/23/2021 using the [place] (analysis version: 14.10) manufactured by [employer]. The following summarizes the results of our evaluation.  PATIENT BIOGRAPHICAL: Name: Gabrielle Pineda, Gabrielle Pineda Patient ID: [id number] Birth Date: 1956/04/04 Height:    66.0 in. Gender:     Female    [age]       Weight:    178.2 lbs. Ethnicity:  White                            Exam Date: 12/23/2021  FRAX* RESULTS:  (version: 3.5) 10-year Probability of Fracture1 Major Osteoporotic Fracture2 Hip Fracture 17.6% 1.8% Population: [place] (Caucasian) Risk Factors: History of Fracture (Adult), Rheumatoid Arthritis  Based on Femur (Left) Neck BMD  1 -The 10-year probability of fracture may be lower than reported if the patient has received treatment. 2 -Major Osteoporotic Fracture: Clinical Spine, Forearm, Hip or Shoulder  *FRAX is a Materials engineer of the [employer] for Metabolic Bone Disease, a [place] (WHO) [facility].  ASSESSMENT: The probability of a major osteoporotic fracture is 17.6% within the next ten years. The probability of a hip fracture is 1.8% within the next ten years.  Pineda have reviewed this report and agree with the above findings.  Mark A. Thornton Papas, M.D.  [facility]  Electronically Signed   By: Lavonia Dana M.D.   On: 12/23/2021 13:59 Note: Reviewed        Physical Exam  General appearance: Well nourished, well developed, and well hydrated. In no apparent acute distress Mental status: Alert, oriented x 3 (person, place, & time)       Respiratory: No evidence of acute respiratory distress Eyes: PERLA Vitals: BP (!) 175/100  Pulse 82   Temp (!) 96.9 F (36.1 C)   Resp 16   Ht 5' 9"  (1.753 m)   Wt 173 lb (78.5 kg)   SpO2 97%   BMI 25.55  kg/m  BMI: Estimated body mass index is 25.55 kg/m as calculated from the following:   Height as of this encounter: 5' 9"  (1.753 m).   Weight as of this encounter: 173 lb (78.5 kg). Ideal: Ideal body weight: 66.2 kg (145 lb 15.1 oz) Adjusted ideal body weight: 71.1 kg (156 lb 12.3 oz)    Lumbar Spine Area Exam  Skin & Axial Inspection: Well healed scar from previous spine surgery detected Alignment: Symmetrical Functional ROM: Pain restricted ROM affecting both sides  Gait & Posture Assessment  Ambulation: Limited Gait: Antalgic Posture: Difficulty standing up straight, due to pain  Lower Extremity Exam      Side: Right lower extremity   Side: Left lower extremity  Stability: No instability observed           Stability: No instability observed          Skin & Extremity Inspection: Skin color, temperature, and hair growth are WNL. No peripheral edema or cyanosis. No masses, redness, swelling, asymmetry, or associated skin lesions. No contractures.   Skin & Extremity Inspection: Skin color, temperature, and hair growth are WNL. No peripheral edema or cyanosis. No masses, redness, swelling, asymmetry, or associated skin lesions. No contractures.  Functional ROM: Pain restricted ROM for hip and knee joints Limited SLR (straight leg raise)   Functional ROM: Pain restricted ROM for hip and knee joints Limited SLR (straight leg raise)  Muscle Tone/Strength: Functionally intact. No obvious neuro-muscular anomalies detected.   Muscle Tone/Strength: Functionally intact. No obvious neuro-muscular anomalies detected.  Sensory (Neurological): Dermatomal pain pattern         Sensory (Neurological): Dermatomal pain pattern        DTR: Patellar: deferred today Achilles: deferred today Plantar: deferred today   DTR: Patellar: deferred today Achilles: deferred today Plantar: deferred today  Palpation: No palpable anomalies   Palpation: No palpable anomalies     Assessment   Status Diagnosis   Controlled Controlled Controlled 1. Spondylosis of cervical region without myelopathy or radiculopathy   2. Neuropathy   3. Failed back surgical syndrome   4. Postlaminectomy syndrome, lumbar region   5. DDD (degenerative disc disease), cervical   6. Lumbar radiculopathy (R>L)   7. Chronic pain syndrome        Plan of Care  Problem-specific:  No problem-specific Assessment & Plan notes found for this encounter.  Gabrielle Pineda has a current medication list which includes the following long-term medication(s): albuterol, albuterol, aripiprazole, atorvastatin, carvedilol, ezetimibe, gabapentin, linzess, mirtazapine, montelukast, omeprazole, potassium chloride sa, sulfasalazine, topiramate, and venlafaxine xr.  Pharmacotherapy (Medications Ordered): Meds ordered this encounter  Medications   oxyCODONE-acetaminophen (PERCOCET) 5-325 MG tablet    Sig: Take 1 tablet by mouth every 8 (eight) hours as needed for severe pain. Must last 30 days.    Dispense:  90 tablet    Refill:  0    Chronic Pain: STOP Act (Not applicable) Fill 1 day early if closed on refill date. Avoid benzodiazepines within 8 hours of opioids   oxyCODONE-acetaminophen (PERCOCET) 5-325 MG tablet    Sig: Take 1 tablet by mouth every 8 (eight) hours as needed for severe pain. Must last 30 days.    Dispense:  90 tablet    Refill:  0  Chronic Pain: STOP Act (Not applicable) Fill 1 day early if closed on refill date. Avoid benzodiazepines within 8 hours of opioids   oxyCODONE-acetaminophen (PERCOCET) 5-325 MG tablet    Sig: Take 1 tablet by mouth every 8 (eight) hours as needed for severe pain. Must last 30 days.    Dispense:  90 tablet    Refill:  0    Chronic Pain: STOP Act (Not applicable) Fill 1 day early if closed on refill date. Avoid benzodiazepines within 8 hours of opioids  Continue gabapentin and Topamax as prescribed, no refills needed Continue Mobic as needed for acute on chronic pain flare Continue  care with psychiatry UDS UTD    Follow-up plan:   Return in about 3 months (around 07/08/2022) for Medication Management, in person.    Recent Visits Date Type Provider Dept  01/15/22 Office Visit Gabrielle Pineda [facility]  Showing recent visits within past 90 days and meeting all other requirements Today's Visits Date Type Provider Dept  04/07/22 Office Visit Gabrielle Pineda [facility]  Showing today's visits and meeting all other requirements Future Appointments Date Type Provider Dept  06/30/22 Appointment Gabrielle Pineda [facility]  Showing future appointments within next 90 days and meeting all other requirements  Pineda discussed the assessment and treatment plan with the patient. The patient was provided an opportunity to ask questions and all were answered. The patient agreed with the plan and demonstrated an understanding of the instructions.  Patient advised to call back or seek an in-person evaluation if the symptoms or condition worsens.  Duration of encounter: 8mnutes.  Note by: BGillis Santa Pineda Date: 04/07/2022; Time: 9:03 AM

## 2022-04-07 NOTE — Progress Notes (Signed)
Hematology/Oncology Progress note [phone] [phone]         Patient Care Team: Mikey Kirschner, PA-C as PCP - General (Physician Assistant) Minna Merritts, MD as PCP - Cardiology (Cardiology) Manya Silvas, MD (Inactive) (Gastroenterology) Germaine Pomfret, [facility] (Pharmacist)   REFERRING PROVIDER: Gwyneth Sprout, FNP  CHIEF COMPLAINTS/REASON FOR VISIT:  Anemia  ASSESSMENT & PLAN:  Macrocytic anemia Chronic normocytic anemia Labs reviewed and discussed with patient. Normal iron panel, B12 and folate, normal TSH.  Negative multiple myeloma panel, light chain ratio.  Normal LDH. Anemia could be secondary to chronic inflammation/autoimmune disease.  Other differential includes underlying bone marrow disease, i.e. MDS.  Plan to repeat blood work in 2 months to further trend level.  If progressively decreasing, I would recommend a bone marrow biopsy.  Orders Placed This Encounter  Procedures   CBC with Differential/Platelet    Standing Status:   Future    Standing Expiration Date:   04/07/2023   Comprehensive metabolic panel    Standing Status:   Future    Standing Expiration Date:   04/06/2023   Lactate dehydrogenase    Standing Status:   Future    Standing Expiration Date:   04/07/2023   Haptoglobin    Standing Status:   Future    Standing Expiration Date:   04/07/2023   Retic Panel    Standing Status:   Future    Standing Expiration Date:   04/07/2023   Technologist smear review    Standing Status:   Future    Standing Expiration Date:   04/07/2023    Order Specific Question:   Clinical information:    Answer:   macrocytic anemia   Ferritin    Standing Status:   Future    Standing Expiration Date:   04/07/2023   Iron and TIBC    Standing Status:   Future    Standing Expiration Date:   04/07/2023   Follow-up  2 months. All questions were answered. The patient knows to call the clinic with any problems, questions or concerns.  Earlie Server, MD, PhD [facility] Hematology Oncology 04/06/2022     HISTORY OF PRESENTING ILLNESS:  Gabrielle Pineda is a  66 y.o.  female with PMH listed below who was referred to me for anemia Reviewed patient's recent labs that was done.  She was found to have abnormal CBC on 02/20/2022, she has a hemoglobin of 10, MCV 96, normal white count and platelet counts.  Normal differential.  Iron panel showed TIBC 248, ferritin 173, iron saturation 44.  Folate 15.4, vitamin B12 674. Reviewed patient's previous labs ordered by primary care physician's office, anemia is chronic onset , duration is since July 2022. No aggravating or improving factors. Patient reports feeling fatigued. She denies recent chest pain on exertion, shortness of breath on minimal exertion, pre-syncopal episodes, or palpitations She had not noticed any recent bleeding such as epistaxis, hematuria or hematochezia.  She takes over the counter NSAID [Advil, Mobic].  Patient has autoimmune disease-rheumatoid arthritis, she is taking Arava and sulfasalazine. Her last colonoscopy was 11/01/2017. She denies any pica and eats a variety of diet.  Patient has a history of alcohol abuse and she has been sober for many years.  She had alcoholic cirrhosis diagnosed in 1994.  All recovered per patient.   INTERVAL HISTORY Gabrielle Pineda is a 66 y.o. female who has above history reviewed by me today presents for follow up visit for management  of anemia Patient present to discuss results.  Chronic fatigue.  No new complaints.   MEDICAL HISTORY:  Past Medical History:  Diagnosis Date   Alcohol abuse    Anemia    Anxiety    Arthritis    Cervicalgia    Cirrhosis ([place]) 1994   COPD (chronic obstructive pulmonary disease) ([place])    Depression    Difficult intubation    has plates and screws in neck   Dyspnea    GERD (gastroesophageal reflux disease)    Headache    Heart murmur    on heard when pt is lying   Hypertension    Other and  unspecified hyperlipidemia    Status post insertion of spinal cord stimulator    Tachycardia    d/t questionable anxiety happens every 5- 10 years, sees [facility]    SURGICAL HISTORY: Past Surgical History:  Procedure Laterality Date   ANTERIOR CERVICAL DECOMP/DISCECTOMY FUSION  2012, 2015, 2018   x3   AUGMENTATION MAMMAPLASTY Bilateral [place] RELEASE  11/11/2011   Procedure: CARPAL TUNNEL RELEASE;  Surgeon: Floyce Stakes, MD;  Location: [facility];  Service: Neurosurgery;  Laterality: Right;  Right Median Nerve Decompression   CARPAL TUNNEL RELEASE  2013   COLONOSCOPY WITH PROPOFOL N/A 11/01/2017   Procedure: COLONOSCOPY WITH PROPOFOL;  Surgeon: Manya Silvas, MD;  Location[facility] ENDOSCOPY;  Service: Endoscopy;  Laterality: N/A;   ESOPHAGOGASTRODUODENOSCOPY     LUMBAR LAMINECTOMY/DECOMPRESSION MICRODISCECTOMY N/A 08/15/2019   Procedure: Lumbar three to Sacral one Decompressive lumbar laminectomy;  Surgeon: Erline Levine, MD;  Location: [place];  Service: Neurosurgery;  Laterality: N/A;   neck disc surgery     plates and screws in neck x 2   ROTATOR CUFF REPAIR  06/16/2016   right shoulder    ROTATOR CUFF REPAIR     SPINAL CORD STIMULATOR INSERTION N/A 03/17/2021   Procedure: THORACIC SPINAL CORD STIMULATOR (PERCUTANEOUS) & PULSE GENERATOR PLACEMENT;  Surgeon: [name], MD;  Location: [facility] ORS;  Service: Neurosurgery;  Laterality: N/A;   TONSILLECTOMY AND ADENOIDECTOMY  1973    TOTAL HIP ARTHROPLASTY Right 04/15/2021   Procedure: TOTAL HIP ARTHROPLASTY ANTERIOR APPROACH;  Surgeon: Hessie Knows, MD;  Location: [facility] ORS;  Service: Orthopedics;  Laterality: Right;    SOCIAL HISTORY: Social History   Socioeconomic History   Marital status: Married    Spouse name: jerry   Number of children: 0   Years of education: Not on file   Highest education level: Bachelor's degree (e.g., BA, AB, BS)  Occupational History    Not on file  Tobacco Use   Smoking status: Former    Packs/day: 2.00    Years: 40.00    Total pack years: 80.00    Types: Cigarettes    Quit date: 10/30/2020    Years since quitting: 1.4   Smokeless tobacco: Never  Vaping Use   Vaping Use: Former   Devices: vape occasionally  Substance and Sexual Activity   Alcohol use: No    Comment: recovering alcoholic Since 9179    Drug use: No   Sexual activity: Not Currently  Other Topics Concern   Not on file  Social History Narrative   Married; full time; does not get regular exercise.    Social Determinants of Health   Financial Resource Strain: Low Risk  (11/03/2021)   Overall Financial Resource Strain (CARDIA)    Difficulty of Paying Living Expenses:  Not very hard  Food Insecurity: No Food Insecurity (11/03/2021)   Hunger Vital Sign    Worried About Running Out of Food in the Last Year: Never true    Ran Out of Food in the Last Year: Never true  Transportation Needs: No Transportation Needs (11/03/2021)   PRAPARE - Hydrologist (Medical): No    Lack of Transportation (Non-Medical): No  Physical Activity: Inactive (11/03/2021)   Exercise Vital Sign    Days of Exercise per Week: 0 days    Minutes of Exercise per Session: 0 min  Stress: Stress Concern Present (11/03/2021)   [place]    Feeling of Stress : To some extent  Social Connections: Moderately Isolated (11/03/2021)   Social Connection and Isolation Panel [NHANES]    Frequency of Communication with Friends and Family: More than three times a week    Frequency of Social Gatherings with Friends and Family: More than three times a week    Attends Religious Services: Never    Marine scientist or Organizations: No    Attends Archivist Meetings: Never    Marital Status: Married  Human resources officer Violence: Not At Risk (11/03/2021)   Humiliation, Afraid, Rape, and Kick  questionnaire    Fear of Current or Ex-Partner: No    Emotionally Abused: No    Physically Abused: No    Sexually Abused: No    FAMILY HISTORY: Family History  Problem Relation Age of Onset   Alcohol abuse Mother    Bipolar disorder Mother    Suicidality Mother    Pancreatic cancer Father    Alcohol abuse Father    Drug abuse Sister    Alcohol abuse Sister    Depression Sister    Anesthesia problems Neg Hx    Breast cancer Neg Hx     ALLERGIES:  is allergic to plaquenil [hydroxychloroquine].  MEDICATIONS:  Current Outpatient Medications  Medication Sig Dispense Refill   acyclovir ointment (ZOVIRAX) 5 % SMARTSIG:Topical Every 3 Hours     albuterol (PROVENTIL) (2.5 MG/3ML) 0.083% nebulizer solution Take 3 mLs (2.5 mg total) by nebulization every 4 (four) hours as needed for wheezing or shortness of breath. 225 mL 2   albuterol (VENTOLIN HFA) 108 (90 Base) MCG/ACT inhaler INHALE TWO PUFFS BY MOUTH INTO LUNGS every SIX hours AS NEEDED FOR WHEEZING AND/OR SHORTNESS OF BREATH 8.5 g 2   amoxicillin-clavulanate (AUGMENTIN) 875-125 MG tablet Take 1 tablet by mouth 2 (two) times daily. (Patient not taking: Reported on 04/07/2022) 10 tablet 0   ARIPiprazole (ABILIFY) 5 MG tablet Take 1 tablet (5 mg total) by mouth daily. 30 tablet 1   atorvastatin (LIPITOR) 40 MG tablet Take 1 tablet (40 mg total) by mouth every morning. 90 tablet 3   Budeson-Glycopyrrol-Formoterol (BREZTRI AEROSPHERE) 160-9-4.8 MCG/ACT AERO Inhale 2 puffs into the lungs in the morning and at bedtime. 10.7 g 11   carvedilol (COREG) 12.5 MG tablet TAKE ONE TABLET BY MOUTH EVERY MORNING and TAKE ONE TABLET BY MOUTH EVERYDAY AT BEDTIME 180 tablet 3   ezetimibe (ZETIA) 10 MG tablet Take 1 tablet (10 mg total) by mouth every morning. 90 tablet 3   gabapentin (NEURONTIN) 400 MG capsule Take 1 capsule (400 mg total) by mouth 2 (two) times daily. 60 capsule 5   leflunomide (ARAVA) 20 MG tablet Take 20 mg by mouth at bedtime.      LINZESS 145 MCG CAPS capsule  Take 145 mcg by mouth daily before breakfast.     meloxicam (MOBIC) 15 MG tablet Take 15 mg by mouth at bedtime.     mirtazapine (REMERON) 15 MG tablet Take 1 tablet (15 mg total) by mouth at bedtime. Dose change 90 tablet 0   montelukast (SINGULAIR) 10 MG tablet Take 1 tablet (10 mg total) by mouth at bedtime. 30 tablet 11   Multiple Vitamin (MULTIVITAMIN WITH MINERALS) TABS tablet Take 1 tablet by mouth daily.     omeprazole (PRILOSEC) 40 MG capsule Take 1 capsule (40 mg total) by mouth 2 (two) times daily. 180 capsule 1   potassium chloride SA (KLOR-CON M) 20 MEQ tablet Take 1 tablet (20 mEq total) by mouth daily with breakfast. 90 tablet 1   sulfaSALAzine (AZULFIDINE) 500 MG tablet Take 1,000 mg by mouth 2 (two) times daily.     topiramate (TOPAMAX) 25 MG tablet Take 1 tablet (25 mg total) by mouth 2 (two) times daily. 60 tablet 11   valACYclovir (VALTREX) 1000 MG tablet TAKE TWO TABLETS BY MOUTH twice daily FOR 5 DAYS AS NEEDED FOR FEVER blisters 30 tablet 0   venlafaxine XR (EFFEXOR-XR) 75 MG 24 hr capsule Take 3 capsules (225 mg total) by mouth daily with breakfast. TAKE 3 CAPSULES BY MOUTH ONCE DAILY WITH BREAKFAST 270 capsule 1   XIIDRA 5 % SOLN Place 1 drop into both eyes daily.     [START ON 04/26/2022] oxyCODONE-acetaminophen (PERCOCET) 5-325 MG tablet Take 1 tablet by mouth every 8 (eight) hours as needed for severe pain. Must last 30 days. 90 tablet 0   [START ON 05/26/2022] oxyCODONE-acetaminophen (PERCOCET) 5-325 MG tablet Take 1 tablet by mouth every 8 (eight) hours as needed for severe pain. Must last 30 days. 90 tablet 0   [START ON 06/25/2022] oxyCODONE-acetaminophen (PERCOCET) 5-325 MG tablet Take 1 tablet by mouth every 8 (eight) hours as needed for severe pain. Must last 30 days. 90 tablet 0   No current facility-administered medications for this visit.    Review of Systems  Constitutional:  Positive for fatigue. Negative for appetite change,  chills and fever.  HENT:   Negative for hearing loss and voice change.   Eyes:  Negative for eye problems.  Respiratory:  Negative for chest tightness and cough.   Cardiovascular:  Negative for chest pain.  Gastrointestinal:  Negative for abdominal distention, abdominal pain and blood in stool.  Endocrine: Negative for hot flashes.  Genitourinary:  Negative for difficulty urinating and frequency.   Musculoskeletal:  Positive for arthralgias.  Skin:  Negative for itching and rash.  Neurological:  Negative for extremity weakness.  Hematological:  Negative for adenopathy.  Psychiatric/Behavioral:  Negative for confusion.     PHYSICAL EXAMINATION: ECOG PERFORMANCE STATUS: 1 - Symptomatic but completely ambulatory Vitals:   04/06/22 1443  Temp: 98.9 F (37.2 C)   There were no vitals filed for this visit.   Physical Exam Constitutional:      General: She is not in acute distress. HENT:     Head: Normocephalic and atraumatic.  Eyes:     General: No scleral icterus. Cardiovascular:     Rate and Rhythm: Normal rate and regular rhythm.     Heart sounds: Normal heart sounds.  Pulmonary:     Effort: Pulmonary effort is normal. No respiratory distress.     Breath sounds: No wheezing.  Abdominal:     General: Bowel sounds are normal. There is no distension.     Palpations:  Abdomen is soft.  Musculoskeletal:        General: No deformity. Normal range of motion.     Cervical back: Normal range of motion and neck supple.  Skin:    General: Skin is warm and dry.     Findings: No erythema or rash.  Neurological:     Mental Status: She is alert and oriented to person, place, and time. Mental status is at baseline.     Cranial Nerves: No cranial nerve deficit.     Coordination: Coordination normal.  Psychiatric:        Mood and Affect: Mood normal.      LABORATORY DATA:  I have reviewed the data as listed    Latest Ref Rng & Units 03/10/2022   10:11 AM 02/20/2022    8:42 AM  11/17/2021    9:59 AM  CBC  WBC 4.0 - 10.5 K/uL 5.7  4.8  5.9   Hemoglobin 12.0 - 15.0 g/dL 9.8  10.0  10.9   Hematocrit 36.0 - 46.0 % 31.1  30.7  33.3   Platelets 150 - 400 K/uL 223  252  309       Latest Ref Rng & Units 03/10/2022   10:11 AM 11/17/2021    9:59 AM 04/16/2021    4:26 AM  CMP  Glucose 70 - 99 mg/dL 108  96  134   BUN 8 - 23 mg/dL _0 Creatinine 0.44 - 1.00 mg/dL 1.02  0.99  0.99   Sodium 135 - 145 mmol/L 133  142  139   Potassium 3.5 - 5.1 mmol/L 3.5  4.7  3.5   Chloride 98 - 111 mmol/L 109  109  111   CO2 22 - 32 mmol/L _1 Calcium 8.9 - 10.3 mg/dL 8.6  9.4  8.3   Total Protein 6.5 - 8.1 g/dL 6.2  6.6    Total Bilirubin 0.3 - 1.2 mg/dL 0.5  <0.2    Alkaline Phos 38 - 126 U/L 103  173    AST 15 - 41 U/L 22  21    ALT 0 - 44 U/L 14  18          Component Value Date/Time   IRON 109 02/20/2022 0842   TIBC 248 (L) 02/20/2022 0842   FERRITIN 173 (H) 02/20/2022 0842   IRONPCTSAT 44 02/20/2022 0842     RADIOGRAPHIC STUDIES: I have personally reviewed the radiological images as listed and agreed with the findings in the report. No results found.

## 2022-04-14 ENCOUNTER — Other Ambulatory Visit: Payer: Self-pay | Admitting: Family Medicine

## 2022-04-14 ENCOUNTER — Other Ambulatory Visit: Payer: Self-pay | Admitting: Student in an Organized Health Care Education/Training Program

## 2022-04-14 DIAGNOSIS — K219 Gastro-esophageal reflux disease without esophagitis: Secondary | ICD-10-CM

## 2022-04-15 ENCOUNTER — Telehealth: Payer: Self-pay

## 2022-04-15 DIAGNOSIS — K219 Gastro-esophageal reflux disease without esophagitis: Secondary | ICD-10-CM

## 2022-04-15 NOTE — Progress Notes (Signed)
Chronic Care Management Pharmacy Assistant   Name: Gabrielle Pineda  MRN: [id number] DOB: 01-Apr-1956  Reason for Encounter: Medication Review/Medication Coordination Call for [facility]   Recent office visits:  None ID  Recent consult visits:  04/07/2022 [name], MD (Pain Medicine) for Follow-up - No medication changes noted, No orders placed, Patient to follow-up in 3 months  04/06/2022 Earlie Server, MD (Oncology) for Follow-up- Started: Amoxicillin-Pot Clauvulanate 875-125 mg 1 tablet twice daily, Lab orders placed, No follow-up noted  04/06/2022 Starr Sinclair [place], PA (Rheumatology) No medication changes noted, Lab orders placed, patient to follow-up in 3 months  03/31/2022 Ursula Alert, MD [facility]) I did not break the glass on this note. Not sure if any changes were made.  Hospital visits:  None in previous 6 months  Medications: Outpatient Encounter Medications as of 04/15/2022  Medication Sig   acyclovir ointment (ZOVIRAX) 5 % SMARTSIG:Topical Every 3 Hours   albuterol (PROVENTIL) (2.5 MG/3ML) 0.083% nebulizer solution Take 3 mLs (2.5 mg total) by nebulization every 4 (four) hours as needed for wheezing or shortness of breath.   albuterol (VENTOLIN HFA) 108 (90 Base) MCG/ACT inhaler INHALE TWO PUFFS BY MOUTH INTO LUNGS every SIX hours AS NEEDED FOR WHEEZING AND/OR SHORTNESS OF BREATH   amoxicillin-clavulanate (AUGMENTIN) 875-125 MG tablet Take 1 tablet by mouth 2 (two) times daily. (Patient not taking: Reported on 04/07/2022)   ARIPiprazole (ABILIFY) 5 MG tablet Take 1 tablet (5 mg total) by mouth daily.   atorvastatin (LIPITOR) 40 MG tablet Take 1 tablet (40 mg total) by mouth every morning.   Budeson-Glycopyrrol-Formoterol (BREZTRI AEROSPHERE) 160-9-4.8 MCG/ACT AERO Inhale 2 puffs into the lungs in the morning and at bedtime.   carvedilol (COREG) 12.5 MG tablet TAKE ONE TABLET BY MOUTH EVERY MORNING and TAKE ONE TABLET BY MOUTH EVERYDAY AT BEDTIME    ezetimibe (ZETIA) 10 MG tablet Take 1 tablet (10 mg total) by mouth every morning.   gabapentin (NEURONTIN) 400 MG capsule Take 1 capsule (400 mg total) by mouth 2 (two) times daily.   leflunomide (ARAVA) 20 MG tablet Take 20 mg by mouth at bedtime.   LINZESS 145 MCG CAPS capsule Take 145 mcg by mouth daily before breakfast.   meloxicam (MOBIC) 15 MG tablet Take 15 mg by mouth at bedtime.   mirtazapine (REMERON) 15 MG tablet Take 1 tablet (15 mg total) by mouth at bedtime. Dose change   montelukast (SINGULAIR) 10 MG tablet Take 1 tablet (10 mg total) by mouth at bedtime.   Multiple Vitamin (MULTIVITAMIN WITH MINERALS) TABS tablet Take 1 tablet by mouth daily.   omeprazole (PRILOSEC) 40 MG capsule Take 1 capsule (40 mg total) by mouth 2 (two) times daily.   [START ON 04/26/2022] oxyCODONE-acetaminophen (PERCOCET) 5-325 MG tablet Take 1 tablet by mouth every 8 (eight) hours as needed for severe pain. Must last 30 days.   [START ON 05/26/2022] oxyCODONE-acetaminophen (PERCOCET) 5-325 MG tablet Take 1 tablet by mouth every 8 (eight) hours as needed for severe pain. Must last 30 days.   [START ON 06/25/2022] oxyCODONE-acetaminophen (PERCOCET) 5-325 MG tablet Take 1 tablet by mouth every 8 (eight) hours as needed for severe pain. Must last 30 days.   potassium chloride SA (KLOR-CON M) 20 MEQ tablet Take 1 tablet (20 mEq total) by mouth daily with breakfast.   sulfaSALAzine (AZULFIDINE) 500 MG tablet Take 1,000 mg by mouth 2 (two) times daily.   topiramate (TOPAMAX) 25 MG tablet Take 1 tablet (25 mg total)  by mouth 2 (two) times daily.   valACYclovir (VALTREX) 1000 MG tablet TAKE TWO TABLETS BY MOUTH twice daily FOR 5 DAYS AS NEEDED FOR FEVER blisters   venlafaxine XR (EFFEXOR-XR) 75 MG 24 hr capsule Take 3 capsules (225 mg total) by mouth daily with breakfast. TAKE 3 CAPSULES BY MOUTH ONCE DAILY WITH BREAKFAST   XIIDRA 5 % SOLN Place 1 drop into both eyes daily.   No facility-administered encounter  medications on file as of 04/15/2022.   Care Gaps: Zoster Vaccine Influenza Vaccine PAP Smear BP>140/90  Star Rating Drugs: Atorvastatin 40 mg last filled on 03/26/2022 for a 30-Day supply with [facility]  BP Readings from Last 3 Encounters:  04/07/22 (!) 175/100  03/10/22 (!) 153/102  02/20/22 123/61    Lab Results  Component Value Date   HGBA1C 5.3 06/26/2021    Patient obtains medications through Adherence Packaging  30 Days   Last adherence delivery included:  Meloxicam 15 mg tablet- Take one tablet by mouth once daily (Bedtime) Ezetimibe (Zetia) 10 mg tablet Take one tablet by mouth daily (Breakfast) Atorvastatin 40 mg tablet- Take one tablet by mouth daily (Breakfast) Carvedilol 12.5 mg tablet - Take on tablet by mouth twice daily (Breakfast, Bedtime) Mirtazapine 7.5 mg tablet-Take one tablet daily (Bedtime) Topiramate 25 mg tablet-Take one tablet by mouth twice daily (Breakfast, Bedtime) Gabapentin 400 mg tablet- Take one tablet by mouth twice daily (Breakfast, Bedtime) Linzess 145 mcg- Take one tablet by mouth daily (Before Breakfast) (Vials) Venlafaxine ER 75 mg capsule- Take three capsules by mouth daily (Breakfast) Omeprazole 40 mg- Take one capsule by mouth two times a day (Breakfast, Bedtime) Potassium 20 mEq - Take one capsule daily (Breakfast) Aripiprazole 10 mg tablet- Take one tablet by mouth daily (Breakfast) Leflunomide 20 mg tablet- Take one tablet by mouth once daily (Bedtime) Montelukast (Singulair) 10 mg tablet 1 tablet daily (Bedtime) Breztri Inhaler 160-9-4.8 mcg 2 puffs in twice daily  Patient declined the following medications for the month of August: Albuterol 90 mcg 2 puffs every 6 hours prn - PRN Medication patient has ample supply at this time  Patient is due for next adherence delivery on: 04/27/2022 (Monday) [facility].  Called patient and reviewed medications and coordinated delivery.  This delivery to include: Meloxicam 15 mg  tablet- Take one tablet by mouth once daily (Bedtime) Ezetimibe (Zetia) 10 mg tablet Take one tablet by mouth daily (Breakfast) Atorvastatin 40 mg tablet- Take one tablet by mouth daily (Breakfast) Carvedilol 12.5 mg tablet - Take on tablet by mouth twice daily (Breakfast, Bedtime) Mirtazapine 15 mg tablet-Take one tablet daily (Bedtime) Topiramate 25 mg tablet-Take one tablet by mouth twice daily (Breakfast, Bedtime) Gabapentin 400 mg tablet- Take one tablet by mouth twice daily (Breakfast, Bedtime) Linzess 145 mcg- Take one tablet by mouth daily (Before Breakfast) (Vials) Venlafaxine ER 75 mg capsule- Take three capsules by mouth daily (Breakfast) Omeprazole 40 mg- Take one capsule by mouth two times a day (Breakfast, Bedtime) Potassium 20 mEq - Take one capsule daily (Breakfast) Aripiprazole 5 mg tablet- Take one tablet by mouth daily (Breakfast) Leflunomide 20 mg tablet- Take one tablet by mouth once daily (Bedtime) Montelukast (Singulair) 10 mg tablet 1 tablet daily (Bedtime) Breztri Inhaler 160-9-4.8 mcg 2 puffs in twice daily Sulfasalazine 500 mg take 1000 mg by mouth twice daily (Breakfast, Bedtime)  Patient declined the following medications for the month of September: Albuterol 90 mcg 2 puffs every 6 hours prn - PRN Medication patient has ample supply  at this time  Patient needs refills for Gabapentin 400 mg which is a specialist medication. The request should be sent by [facility] to Dr. Holley Raring and Omeprazole 40 mg which can be sent in by CPP.  Confirmed delivery date of 04/27/2022 [facility], advised patient that pharmacy will contact them the morning of delivery.  The patient's Aripiprazole 10 mg has been decreased to 5 mg tablet at bedtime. I added a note to the form to make sure they received the updated prescription for this medication.    Patient has a follow-up telephone appointment with Junius Argyle, CPP on 07/20/2022 @ [place], CPA/CMA Environmental health practitioner Phone: [phone]

## 2022-04-21 ENCOUNTER — Ambulatory Visit (INDEPENDENT_AMBULATORY_CARE_PROVIDER_SITE_OTHER): Payer: Medicare Other | Admitting: Physician Assistant

## 2022-04-21 ENCOUNTER — Ambulatory Visit
Admission: RE | Admit: 2022-04-21 | Discharge: 2022-04-21 | Disposition: A | Discharge status: Home / Self Care | Payer: Medicare Other | Source: Ambulatory Visit | Attending: Physician Assistant | Admitting: Physician Assistant

## 2022-04-21 ENCOUNTER — Ambulatory Visit
Admission: RE | Admit: 2022-04-21 | Discharge: 2022-04-21 | Disposition: A | Discharge status: Home / Self Care | Payer: Medicare Other | Attending: Physician Assistant | Admitting: Physician Assistant

## 2022-04-21 ENCOUNTER — Encounter: Payer: Self-pay | Admitting: Physician Assistant

## 2022-04-21 VITALS — BP 122/62 | HR 74 | Temp 98.1°F | Ht 69.0 in | Wt 173.1 lb

## 2022-04-21 DIAGNOSIS — J441 Chronic obstructive pulmonary disease with (acute) exacerbation: Secondary | ICD-10-CM | POA: Diagnosis not present

## 2022-04-21 DIAGNOSIS — J449 Chronic obstructive pulmonary disease, unspecified: Secondary | ICD-10-CM | POA: Diagnosis not present

## 2022-04-21 DIAGNOSIS — R062 Wheezing: Secondary | ICD-10-CM | POA: Diagnosis not present

## 2022-04-21 DIAGNOSIS — J439 Emphysema, unspecified: Secondary | ICD-10-CM | POA: Diagnosis not present

## 2022-04-21 MED ORDER — PREDNISONE 10 MG PO TABS: ORAL_TABLET | ORAL | 0 refills | Status: DC

## 2022-04-21 MED ORDER — PREDNISONE 10 MG PO TABS: ORAL_TABLET | ORAL | 0 refills | Status: AC

## 2022-04-21 MED ORDER — OMEPRAZOLE 40 MG PO CPDR: 40.0000 mg | DELAYED_RELEASE_CAPSULE | Freq: Two times a day (BID) | ORAL | 1 refills | Status: DC

## 2022-04-21 NOTE — Addendum Note (Signed)
Addended by: Daron Offer A on: 04/21/2022 01:01 PM   Modules accepted: Orders

## 2022-04-21 NOTE — Progress Notes (Signed)
I,Sha'taria Tyson,acting as a Education administrator for [employer], PA-C.,have documented all relevant documentation on the behalf of Mikey Kirschner, PA-C,as directed by  Mikey Kirschner, PA-C while in the presence of Mikey Kirschner, PA-C.   Established patient visit   Patient: Gabrielle Pineda   DOB: [birth year]   66 y.o. Female  MRN: [id number] Visit Date: 04/21/2022  Today's healthcare provider: Mikey Kirschner, PA-C   Cc. Chest congestion  Subjective    HPI  Gabrielle Pineda is a 66 y/o female with a history of COPD who presents today with nasal, chest congestion, increased cough w/ productive yellow-green mucous.  She reports being rx Augmentin by Dr Tasia Catchings her hematologist without any change in symptoms. Reports being consistent w/ albuterol inhaler/nebulizer and breztri and mucinex otc.   Medications: Outpatient Medications Prior to Visit  Medication Sig   acyclovir ointment (ZOVIRAX) 5 % SMARTSIG:Topical Every 3 Hours   albuterol (PROVENTIL) (2.5 MG/3ML) 0.083% nebulizer solution Take 3 mLs (2.5 mg total) by nebulization every 4 (four) hours as needed for wheezing or shortness of breath.   albuterol (VENTOLIN HFA) 108 (90 Base) MCG/ACT inhaler INHALE TWO PUFFS BY MOUTH INTO LUNGS every SIX hours AS NEEDED FOR WHEEZING AND/OR SHORTNESS OF BREATH   amoxicillin-clavulanate (AUGMENTIN) 875-125 MG tablet Take 1 tablet by mouth 2 (two) times daily.   ARIPiprazole (ABILIFY) 5 MG tablet Take 1 tablet (5 mg total) by mouth daily.   atorvastatin (LIPITOR) 40 MG tablet Take 1 tablet (40 mg total) by mouth every morning.   Budeson-Glycopyrrol-Formoterol (BREZTRI AEROSPHERE) 160-9-4.8 MCG/ACT AERO Inhale 2 puffs into the lungs in the morning and at bedtime.   carvedilol (COREG) 12.5 MG tablet TAKE ONE TABLET BY MOUTH EVERY MORNING and TAKE ONE TABLET BY MOUTH EVERYDAY AT BEDTIME   ezetimibe (ZETIA) 10 MG tablet Take 1 tablet (10 mg total) by mouth every morning.   leflunomide (ARAVA) 20 MG tablet Take 20 mg by  mouth at bedtime.   LINZESS 145 MCG CAPS capsule Take 145 mcg by mouth daily before breakfast.   meloxicam (MOBIC) 15 MG tablet Take 15 mg by mouth at bedtime.   mirtazapine (REMERON) 15 MG tablet Take 1 tablet (15 mg total) by mouth at bedtime. Dose change   montelukast (SINGULAIR) 10 MG tablet Take 1 tablet (10 mg total) by mouth at bedtime.   Multiple Vitamin (MULTIVITAMIN WITH MINERALS) TABS tablet Take 1 tablet by mouth daily.   omeprazole (PRILOSEC) 40 MG capsule Take 1 capsule (40 mg total) by mouth 2 (two) times daily.   [START ON 04/26/2022] oxyCODONE-acetaminophen (PERCOCET) 5-325 MG tablet Take 1 tablet by mouth every 8 (eight) hours as needed for severe pain. Must last 30 days.   [START ON 05/26/2022] oxyCODONE-acetaminophen (PERCOCET) 5-325 MG tablet Take 1 tablet by mouth every 8 (eight) hours as needed for severe pain. Must last 30 days.   [START ON 06/25/2022] oxyCODONE-acetaminophen (PERCOCET) 5-325 MG tablet Take 1 tablet by mouth every 8 (eight) hours as needed for severe pain. Must last 30 days.   potassium chloride SA (KLOR-CON M) 20 MEQ tablet Take 1 tablet (20 mEq total) by mouth daily with breakfast.   sulfaSALAzine (AZULFIDINE) 500 MG tablet Take 1,000 mg by mouth 2 (two) times daily.   topiramate (TOPAMAX) 25 MG tablet Take 1 tablet (25 mg total) by mouth 2 (two) times daily.   valACYclovir (VALTREX) 1000 MG tablet TAKE TWO TABLETS BY MOUTH twice daily FOR 5 DAYS AS NEEDED FOR FEVER blisters   venlafaxine  XR (EFFEXOR-XR) 75 MG 24 hr capsule Take 3 capsules (225 mg total) by mouth daily with breakfast. TAKE 3 CAPSULES BY MOUTH ONCE DAILY WITH BREAKFAST   XIIDRA 5 % SOLN Place 1 drop into both eyes daily.   gabapentin (NEURONTIN) 400 MG capsule Take 1 capsule (400 mg total) by mouth 2 (two) times daily.   No facility-administered medications prior to visit.    Review of Systems  Constitutional:  Negative for fatigue and fever.  Respiratory:  Positive for cough, shortness  of breath and wheezing.   Cardiovascular:  Negative for chest pain and leg swelling.  Gastrointestinal:  Negative for abdominal pain.  Neurological:  Negative for dizziness and headaches.       Objective    Blood pressure 122/62, pulse 74, temperature 98.1 F (36.7 C), temperature source Oral, height '5\' 9"'$  (1.753 m), weight 173 lb 1.6 oz (78.5 kg), SpO2 98 %.   Physical Exam Constitutional:      General: She is awake.     Appearance: She is well-developed.  HENT:     Head: Normocephalic.  Eyes:     Conjunctiva/sclera: Conjunctivae normal.  Cardiovascular:     Rate and Rhythm: Normal rate and regular rhythm.     Heart sounds: Normal heart sounds.  Pulmonary:     Effort: Pulmonary effort is normal.     Breath sounds: Examination of the right-upper field reveals wheezing. Examination of the left-upper field reveals wheezing. Examination of the right-middle field reveals wheezing. Examination of the left-middle field reveals wheezing. Examination of the right-lower field reveals wheezing. Examination of the left-lower field reveals wheezing. Wheezing present.  Skin:    General: Skin is warm.  Neurological:     Mental Status: She is alert and oriented to person, place, and time.  Psychiatric:        Attention and Perception: Attention normal.        Mood and Affect: Mood normal.        Speech: Speech normal.        Behavior: Behavior is cooperative.      No results found for any visits on 04/21/22.  Assessment & Plan     Problem List Items Addressed This Visit       Respiratory   COPD exacerbation ([place]) - Primary    Recently finished abx course Wheezing present b/l rx steroid taper, encouraged albuterol nebs q 4-6 hrs cxr r/o pneumo  Encouraged fu with pulm d/t frequent exacerbations      Relevant Medications   predniSONE (DELTASONE) 10 MG tablet   Other Relevant Orders   DG Chest 2 View (Completed)     Return if symptoms worsen or fail to improve.      I,  Mikey Kirschner, PA-C have reviewed all documentation for this visit. The documentation on  04/21/2022 for the exam, diagnosis, procedures, and orders are all accurate and complete.  Mikey Kirschner, PA-C [facility] [address] [place], [place], [address] Office: [phone] Fax: [place]

## 2022-04-21 NOTE — Assessment & Plan Note (Signed)
Recently finished abx course Wheezing present b/l rx steroid taper, encouraged albuterol nebs q 4-6 hrs cxr r/o pneumo  Encouraged fu with pulm d/t frequent exacerbations

## 2022-04-22 ENCOUNTER — Encounter: Payer: Self-pay | Admitting: Dietician

## 2022-04-22 ENCOUNTER — Encounter: Payer: Medicare Other | Attending: Physician Assistant | Admitting: Dietician

## 2022-04-22 VITALS — Ht 69.0 in | Wt 172.1 lb

## 2022-04-22 DIAGNOSIS — E663 Overweight: Secondary | ICD-10-CM | POA: Insufficient documentation

## 2022-04-22 DIAGNOSIS — F1021 Alcohol dependence, in remission: Secondary | ICD-10-CM | POA: Diagnosis not present

## 2022-04-22 DIAGNOSIS — E7800 Pure hypercholesterolemia, unspecified: Secondary | ICD-10-CM

## 2022-04-22 DIAGNOSIS — D539 Nutritional anemia, unspecified: Secondary | ICD-10-CM | POA: Diagnosis not present

## 2022-04-22 DIAGNOSIS — R635 Abnormal weight gain: Secondary | ICD-10-CM

## 2022-04-22 DIAGNOSIS — Z87891 Personal history of nicotine dependence: Secondary | ICD-10-CM | POA: Diagnosis not present

## 2022-04-22 DIAGNOSIS — I10 Essential (primary) hypertension: Secondary | ICD-10-CM

## 2022-04-22 NOTE — Patient Instructions (Signed)
Include a breakfast daily -- if not very hungry, try some vanilla yogurt and fruit, or 1-2 boiled eggs and fruit (fruit can be fresh, frozen, or canned with no added sugar); or [employer] sandwich Work towards eating a meal or snack every 4-5 hours during the day. Try not to go longer than 6 hours without anything to eat. This will stabilize blood sugar, often helps cholesterol and blood pressure, and keeps your metabolism (calorie burning) up higher.  OK to have a late evening snack if up until 10pm or later.  Some sips (1/2) of a protein drink or low sugar breakfast drink is ok if not hungry.  Try Healthy Choice soups for less sodium; find them at [employer], [employer], maybe at [employer] tree. Sodium goal is 500-'600mg'$  per meal or less.

## 2022-04-22 NOTE — Progress Notes (Signed)
Medical Nutrition Therapy: Visit start time: 0900  end time: 1000  Assessment:   Referral Diagnosis: weight gain Other medical history/ diagnoses: COPD, HTN, hyperlipidemia, GERD Psychosocial issues/ stress concerns: none  Medications, supplements: reconciled list in medical record   Preferred learning method:  Auditory    Current weight: 172.1lbs Height: 5'9" BMI: 25.41 Patient's personal weight goal: 135lbs  Progress and evaluation:  Patient reports weight gain due to inactivity after back surgery, now leveled off, but has been unable to lose. She reports working on smaller food portions without yet noticing any change.  Has macrocytic anemia, B12 and folate currently in normal range; sees hematologist and takes multivitamin daily Appetite is low, perhaps due to macrocytic anemia, COPD Has history of alcoholism, in long-term remission; quit cigarette smoking 18 months ago. Recent lab results indicate RBC  low at 3.14, hemoglobin low at 10.1, hematocrit low at 32.7 (04/06/22),  B12 normal at 674, folate normal at 15.4, Total Iron Binding Capacity low at 248, iron normal at 109, ferritin elevated at 173 (02/20/22) Food allergies: none Special diet practices: no Patient seeks help with weight loss to her normal of 135lbs (BMI 20)   Dietary Intake:  Usual eating pattern includes 1-2 meals and 0 snacks per day. Dining out frequency: 3 meals per week. Who plans meals/ buys groceries? self Who prepares meals? self  Breakfast: occasionally sausage and egg biscuit 2x a week; skips on other days Snack: none Lunch: usually skips Snack: none Supper: sub; pasta; burger Snack: none Beverages: water 6-7 glasses daily; Cheerwine soda 2-3 glasses daily (feels it's an addiction)  Physical activity: none due to back pain, COPD   Intervention:   Nutrition Care Education:   Basic nutrition: basic food groups; appropriate nutrient balance; appropriate meal and snack schedule; general  nutrition guidelines    Weight control: identifying healthy weight range and importance of adequate nutrition stores; importance of low sugar and low fat choices; portion control; eating at regular intervals during the day; estimated energy needs for weight loss at minimum of 1200 kcal with needs likely close to 1400kcal daily, and provided guidance for 45% CHO, 25% protein, and 30% fat Appetite: keeping convenient and healthy foods on hand to improve motivation to eat; simple snack and meal options Hypertension: identifying high sodium foods, goal for sodium intake  Other intervention notes: Encouraged goal for healthy weight range with BMI of 20 being lowest weight. Discussed goal of 23-24 BMI allowing for better nutrient stores when dealing with chronic health issues. Established goals for change with input from patient.    Nutritional Diagnosis:  [place]-2.2 Altered nutrition-related laboratory As related to macrocytic anemia.  As evidenced by low RBC, hemoglobin, and hematocrit with elevated cell volumes. [place]-3.4 Unintentional weight gain As related to decreased physical activity.  As evidenced by patient with current BMI of 25.4.   Education Materials given:  Museum/gallery conservator with food lists, sample meal pattern Sample menus Snacking handout Visit summary with goals/ instructions   Learner/ who was taught:  Patient   Level of understanding: Verbalizes/ demonstrates competency   Demonstrated degree of understanding via:   Teach back Learning barriers: None  Willingness to learn/ readiness for change: Eager, change in progress   Monitoring and Evaluation:  Dietary intake, exercise, nutritional status, and body weight      follow up:  06/17/22 at 9:30am

## 2022-04-27 ENCOUNTER — Encounter: Payer: Self-pay | Admitting: Podiatry

## 2022-04-27 ENCOUNTER — Ambulatory Visit: Payer: Medicare Other | Admitting: Podiatry

## 2022-04-27 DIAGNOSIS — B351 Tinea unguium: Secondary | ICD-10-CM

## 2022-04-27 DIAGNOSIS — M2042 Other hammer toe(s) (acquired), left foot: Secondary | ICD-10-CM

## 2022-04-27 DIAGNOSIS — M79675 Pain in left toe(s): Secondary | ICD-10-CM | POA: Diagnosis not present

## 2022-04-27 DIAGNOSIS — M2041 Other hammer toe(s) (acquired), right foot: Secondary | ICD-10-CM

## 2022-04-27 DIAGNOSIS — G629 Polyneuropathy, unspecified: Secondary | ICD-10-CM

## 2022-04-27 DIAGNOSIS — M79674 Pain in right toe(s): Secondary | ICD-10-CM

## 2022-04-27 NOTE — Progress Notes (Signed)
This patient presents to the office with chief complaint of long thick painful nails.  Patient says the nails are painful walking and wearing shoes.  This patient is unable to self treat.  This patient is unable to trim her nails since she is unable to reach her nails.   She presents to the office for preventative foot care services.  General Appearance  Alert, conversant and in no acute stress.  Vascular  Dorsalis pedis and posterior tibial  pulses are palpable  bilaterally.  Capillary return is within normal limits  bilaterally. Temperature is within normal limits  bilaterally.  Neurologic  Senn-Weinstein monofilament wire test within normal limits  bilaterally. Muscle power within normal limits bilaterally.  Nails Thick disfigured discolored nails with subungual debris  from hallux to fifth toes bilaterally. No evidence of bacterial infection or drainage bilaterally.  Orthopedic  No limitations of motion  feet .  No crepitus or effusions noted.  HAV  B/L.  Hammer toes 2-5  B/L.  Skin  normotropic skin with no porokeratosis noted bilaterally.  No signs of infections or ulcers noted.    Onychomycosis  Nails  B/L.  Pain in right toes  Pain in left toes    Debridement of nails both feet with nail nipper  followed trimming the nails with dremel tool.   RTC 3 months.   Kemisha Bonnette DPM   

## 2022-04-29 ENCOUNTER — Encounter: Payer: Self-pay | Admitting: Physician Assistant

## 2022-04-30 ENCOUNTER — Telehealth: Payer: Self-pay | Admitting: *Deleted

## 2022-04-30 ENCOUNTER — Telehealth: Payer: Self-pay

## 2022-04-30 ENCOUNTER — Other Ambulatory Visit: Payer: Self-pay | Admitting: Physician Assistant

## 2022-04-30 NOTE — Telephone Encounter (Signed)
-----   Message from Gabrielle Kirschner, PA-C sent at 04/30/2022  8:52 AM EDT ----- Juluis Rainier reached out to her pulm, they can see her Monday if she cant wait I can fit her in today/tomorrow Sent her a mychart message reply

## 2022-04-30 NOTE — Telephone Encounter (Signed)
Contacted by Mikey Kirschner PA-C regarding this patient that she saw recently for a COPD exacerbation that is not responding to antibiotics/steroids.  She continues to have wheezing, sob (sats are 97% on RA), cough with white/yellow/green mucous.  She has chest and head congestion for 1 month with no improvement.  Mucous from nose is white/yellow/green.  She took a recent covid test that was negative 1 week ago.  She said she has not been on a recent antibiotic, she last had Augmentin on August 21.  She is using her nebulizer and getting relief.  I attempted to get her in to see Dr. Patsey Berthold on Monday 9/18, however, she will be out of town and is unable to come in on that day.  She is willing to go to [facility] to see Rexene Edison NP at 10 am.  Advised to arrive by 9:45 am for check in.  Provided patient with the address and phone number for [facility] office.  Advise to see care at the [facility] if she gets worse over the weekend and before she is seen in our office on 9/20.  She verbalized understanding.  Nothing further needed.

## 2022-05-04 ENCOUNTER — Ambulatory Visit: Payer: Medicare Other | Admitting: Pulmonary Disease

## 2022-05-06 ENCOUNTER — Encounter: Payer: Self-pay | Admitting: Adult Health

## 2022-05-06 ENCOUNTER — Ambulatory Visit: Payer: Medicare Other | Admitting: Adult Health

## 2022-05-06 VITALS — BP 128/80 | HR 82 | Ht 69.0 in | Wt 172.9 lb

## 2022-05-06 DIAGNOSIS — R918 Other nonspecific abnormal finding of lung field: Secondary | ICD-10-CM | POA: Diagnosis not present

## 2022-05-06 DIAGNOSIS — J441 Chronic obstructive pulmonary disease with (acute) exacerbation: Secondary | ICD-10-CM

## 2022-05-06 MED ORDER — BREZTRI AEROSPHERE 160-9-4.8 MCG/ACT IN AERO: 2.0000 | INHALATION_SPRAY | Freq: Two times a day (BID) | RESPIRATORY_TRACT | 6 refills | Status: DC

## 2022-05-06 MED ORDER — LEVOFLOXACIN 500 MG PO TABS: 500.0000 mg | ORAL_TABLET | Freq: Every day | ORAL | 0 refills | Status: DC

## 2022-05-06 MED ORDER — PREDNISONE 10 MG PO TABS: ORAL_TABLET | ORAL | 0 refills | Status: DC

## 2022-05-06 NOTE — Assessment & Plan Note (Signed)
Previous CT chest with lung nodularity noted.  Patient with apical nodularity.  She has a history of heavy smoking.  Appearance typical for smoking related bronchiolitis.  Patient is on Arava and sulfasalazine.  Recent sed rate was normal.  Doubt pulmonary toxicity but need to continue to monitor closely.

## 2022-05-06 NOTE — Assessment & Plan Note (Signed)
Slow to resolve COPD exacerbation.  Chest x-ray without acute process.  Patient is immunosuppressed.  Will check sputum culture for atypical organisms. We will treat with additional empiric antibiotics and steroids.  Increase mucociliary clearance with Mucinex and albuterol nebs.  Plan  Patient Instructions  Sputum culture today  Levaquin '500mg'$  daily for 1 week, take with food  Prednisone taper over next week.  Mucinex DM Twice daily  As needed  cough/congestion  Add Zyrtec '10mg'$  At bedtime   Saline nasal rinses Twice daily   Saline nasal gel At bedtime   Continue on Breztri 2 puffs Twice daily   Continue on Oxygen 2l/m At bedtime.  Albuterol inhaler or neb As needed   Follow up in with Dr. Patsey Berthold in 4 weeks and As needed   Please contact office for sooner follow up if symptoms do not improve or worsen or seek emergency care

## 2022-05-06 NOTE — Progress Notes (Signed)
@Patient  ID: Gabrielle Pineda, female    DOB: 28-Jan-1956, 66 y.o.   MRN: [id number]  Chief Complaint  Patient presents with   Follow-up    Referring provider: Emelia Loron  HPI: 66 year old female former smoker quit in 2022 with heavy smoking history seen for pulmonary consult December 11, 2021 for shortness of breath and COPD, abnormal CT chest with lung nodularity Medical history significant for rheumatoid arthritis maintained on Arava, sulfasalazine (previously on methotrexate), chronic neck and back pain.  TEST/EVENTS :  ONO on 02/13/22 showed + desats with 2 hr < 88% .    pulmonary function testing that showed moderate to severe COPD with an FEV1 at 54%, ratio 64, FVC 64%, 8% bronchodilator change, DLCO 51%.,  Mid flow reversibility.   high-resolution CT chest that showed moderate emphysema, diffuse bilateral bronchial wall thickening, bandlike scarring in the left lower lobe and lingula, irregular nodule in the left upper lobe measuring 0.8 cm, irregular nodule in the right lower lobe measuring 0.8 cm background centrilobular nodularity most concentrated in the lung apices (consistent with smoking-related respiratory bronchiolitis).  Mild tracheobronchomalacia.    ESR 17  Eosinophils absolute 0   05/06/2022 Acute OV : COPD  Patient presents for an acute office visit.  She complains over the last 3 weeks that she has had increased cough, congestion and wheezing.  Patient had a negative COVID and influenza test.  Cough has been productive with thick green mucus.  Was started on Augmentin for 7 days.  She was seen by her primary care provider on September 5 and given a prednisone taper.  Patient says she continues to have ongoing symptoms.  Chest x-ray showed no acute process with chronic changes. Patient says she does feel better but has ongoing lingering cough with intermittent green mucus.  Has wheezing that keeps coming and going.  She denies any hemoptysis, fever, chest  pain, orthopnea or calf pain or edema.  She has been using Mucinex DM over-the-counter.  She is taken albuterol nebs once daily.  She remains on Breztri inhaler twice daily. Judithann Sauger is very expensive cost her $200 a month. She was started on oxygen 2 L at bedtime after recent overnight oximetry test in June.   Allergies  Allergen Reactions   Plaquenil [Hydroxychloroquine] Rash    Immunization History  Administered Date(s) Administered   Fluad Quad(high Dose 65+) 10/01/2021   Influenza Split 09/07/2012   Influenza,inj,Quad PF,6+ Mos 11/18/2015, 07/23/2016, 07/02/2017, 04/14/2018, 04/17/2019, 04/19/2020   Influenza-Unspecified 09/24/2014   PFIZER(Purple Top)SARS-COV-2 Vaccination 11/21/2019, 12/12/2019   PNEUMOCOCCAL CONJUGATE-20 11/17/2021   Pneumococcal Polysaccharide-23 02/12/2011, 09/24/2014, 04/19/2020   Tdap 02/12/2011, 11/15/2015    Past Medical History:  Diagnosis Date   Alcohol abuse    Anemia    Anxiety    Arthritis    Cervicalgia    Cirrhosis ([place]) 1994   COPD (chronic obstructive pulmonary disease) ([place])    Depression    Difficult intubation    has plates and screws in neck   Dyspnea    GERD (gastroesophageal reflux disease)    Headache    Heart murmur    on heard when pt is lying   Hypertension    Other and unspecified hyperlipidemia    Status post insertion of spinal cord stimulator    Tachycardia    d/t questionable anxiety happens every 5- 10 years, sees [facility]    Tobacco History: Social History   Tobacco Use  Smoking Status Former   Packs/day: 2.00  Years: 40.00   Total pack years: 80.00   Types: Cigarettes   Quit date: 10/30/2020   Years since quitting: 1.5  Smokeless Tobacco Never   Counseling given: Not Answered   Outpatient Medications Prior to Visit  Medication Sig Dispense Refill   acyclovir ointment (ZOVIRAX) 5 % SMARTSIG:Topical Every 3 Hours     albuterol (PROVENTIL) (2.5 MG/3ML) 0.083% nebulizer solution Take 3 mLs  (2.5 mg total) by nebulization every 4 (four) hours as needed for wheezing or shortness of breath. 225 mL 2   albuterol (VENTOLIN HFA) 108 (90 Base) MCG/ACT inhaler INHALE TWO PUFFS BY MOUTH INTO LUNGS every SIX hours AS NEEDED FOR WHEEZING AND/OR SHORTNESS OF BREATH 8.5 g 2   ARIPiprazole (ABILIFY) 5 MG tablet Take 1 tablet (5 mg total) by mouth daily. 30 tablet 1   atorvastatin (LIPITOR) 40 MG tablet Take 1 tablet (40 mg total) by mouth every morning. 90 tablet 3   Budeson-Glycopyrrol-Formoterol (BREZTRI AEROSPHERE) 160-9-4.8 MCG/ACT AERO Inhale 2 puffs into the lungs in the morning and at bedtime. 10.7 g 11   carvedilol (COREG) 12.5 MG tablet TAKE ONE TABLET BY MOUTH EVERY MORNING and TAKE ONE TABLET BY MOUTH EVERYDAY AT BEDTIME 180 tablet 3   ezetimibe (ZETIA) 10 MG tablet Take 1 tablet (10 mg total) by mouth every morning. 90 tablet 3   leflunomide (ARAVA) 20 MG tablet Take 20 mg by mouth at bedtime.     LINZESS 145 MCG CAPS capsule Take 145 mcg by mouth daily before breakfast.     meloxicam (MOBIC) 15 MG tablet Take 15 mg by mouth at bedtime.     mirtazapine (REMERON) 15 MG tablet Take 1 tablet (15 mg total) by mouth at bedtime. Dose change 90 tablet 0   montelukast (SINGULAIR) 10 MG tablet Take 1 tablet (10 mg total) by mouth at bedtime. 30 tablet 11   Multiple Vitamin (MULTIVITAMIN WITH MINERALS) TABS tablet Take 1 tablet by mouth daily.     omeprazole (PRILOSEC) 40 MG capsule Take 1 capsule (40 mg total) by mouth 2 (two) times daily. 180 capsule 1   oxyCODONE-acetaminophen (PERCOCET) 5-325 MG tablet Take 1 tablet by mouth every 8 (eight) hours as needed for severe pain. Must last 30 days. 90 tablet 0   [START ON 05/26/2022] oxyCODONE-acetaminophen (PERCOCET) 5-325 MG tablet Take 1 tablet by mouth every 8 (eight) hours as needed for severe pain. Must last 30 days. 90 tablet 0   [START ON 06/25/2022] oxyCODONE-acetaminophen (PERCOCET) 5-325 MG tablet Take 1 tablet by mouth every 8 (eight) hours  as needed for severe pain. Must last 30 days. 90 tablet 0   potassium chloride SA (KLOR-CON M) 20 MEQ tablet Take 1 tablet (20 mEq total) by mouth daily with breakfast. 90 tablet 1   sulfaSALAzine (AZULFIDINE) 500 MG tablet Take 1,000 mg by mouth 2 (two) times daily.     topiramate (TOPAMAX) 25 MG tablet Take 1 tablet (25 mg total) by mouth 2 (two) times daily. 60 tablet 11   valACYclovir (VALTREX) 1000 MG tablet TAKE TWO TABLETS BY MOUTH twice daily FOR 5 DAYS AS NEEDED FOR FEVER blisters 30 tablet 0   venlafaxine XR (EFFEXOR-XR) 75 MG 24 hr capsule Take 3 capsules (225 mg total) by mouth daily with breakfast. TAKE 3 CAPSULES BY MOUTH ONCE DAILY WITH BREAKFAST 270 capsule 1   XIIDRA 5 % SOLN Place 1 drop into both eyes daily.     gabapentin (NEURONTIN) 400 MG capsule Take 1 capsule (400 mg  total) by mouth 2 (two) times daily. 60 capsule 5   amoxicillin-clavulanate (AUGMENTIN) 875-125 MG tablet Take 1 tablet by mouth 2 (two) times daily. 10 tablet 0   No facility-administered medications prior to visit.     Review of Systems:   Constitutional:   No  weight loss, night sweats,  Fevers, chills,  +fatigue, or  lassitude.  HEENT:   No headaches,  Difficulty swallowing,  Tooth/dental problems, or  Sore throat,                No sneezing, itching, ear ache, nasal congestion, post nasal drip,   CV:  No chest pain,  Orthopnea, PND, swelling in lower extremities, anasarca, dizziness, palpitations, syncope.   GI  No heartburn, indigestion, abdominal pain, nausea, vomiting, diarrhea, change in bowel habits, loss of appetite, bloody stools.   Resp: .  No chest wall deformity  Skin: no rash or lesions.  GU: no dysuria, change in color of urine, no urgency or frequency.  No flank pain, no hematuria   MS:  No joint pain or swelling.  No decreased range of motion.  No back pain.    Physical Exam  BP 128/80   Pulse 82   Ht 5' 9"  (1.753 m)   Wt 172 lb 14.4 oz (78.4 kg)   SpO2 96%   BMI 25.53  kg/m   GEN: A/Ox3; pleasant , NAD, well nourished    HEENT:  [place]/AT,  NOSE-clear, THROAT-clear, no lesions, no postnasal drip or exudate noted.   NECK:  Supple w/ fair ROM; no JVD; normal carotid impulses w/o bruits; no thyromegaly or nodules palpated; no lymphadenopathy.    RESP few scattered rhonchi with expiratory wheezes.  Speaks in full sentences no accessory muscle use, no dullness to percussion  CARD:  RRR, no m/r/g, no peripheral edema, pulses intact, no cyanosis or clubbing.  GI:   Soft & nt; nml bowel sounds; no organomegaly or masses detected.   Musco: Warm bil, no deformities or joint swelling noted.   Neuro: alert, no focal deficits noted.    Skin: Warm, no lesions or rashes    Lab Results:  CBC    Component Value Date/Time   WBC 5.7 03/10/2022 1011   RBC 3.03 (L) 03/10/2022 1011   RBC 3.00 (L) 03/10/2022 1011   HGB 9.8 (L) 03/10/2022 1011   HGB 10.0 (L) 02/20/2022 0842   HCT 31.1 (L) 03/10/2022 1011   HCT 30.7 (L) 02/20/2022 0842   PLT 223 03/10/2022 1011   PLT 252 02/20/2022 0842   MCV 102.6 (H) 03/10/2022 1011   MCV 96 02/20/2022 0842   MCV 99 06/16/2013 1208   MCH 32.3 03/10/2022 1011   MCHC 31.5 03/10/2022 1011   RDW 13.5 03/10/2022 1011   RDW 13.5 02/20/2022 0842   RDW 13.1 06/16/2013 1208   LYMPHSABS 1.1 03/10/2022 1011   LYMPHSABS 1.2 02/20/2022 0842   MONOABS 0.5 03/10/2022 1011   EOSABS 0.0 03/10/2022 1011   EOSABS 0.3 02/20/2022 0842   BASOSABS 0.0 03/10/2022 1011   BASOSABS 0.0 02/20/2022 0842   BASOSABS 1 06/16/2013 1208    BMET    Component Value Date/Time   NA 133 (L) 03/10/2022 1011   NA 142 11/17/2021 0959   K 3.5 03/10/2022 1011   CL 109 03/10/2022 1011   CO2 19 (L) 03/10/2022 1011   GLUCOSE 108 (H) 03/10/2022 1011   BUN 11 03/10/2022 1011   BUN 5 (L) 11/17/2021 0959   CREATININE 1.02 (H)  03/10/2022 1011   CALCIUM 8.6 (L) 03/10/2022 1011   GFRNONAA >60 03/10/2022 1011   GFRAA 72 05/06/2020 1356    BNP No results  found for: "BNP"  ProBNP No results found for: "PROBNP"  Imaging: DG Chest 2 View  Result Date: 04/21/2022 CLINICAL DATA:  Wheezing, COPD EXAM: CHEST - 2 VIEW COMPARISON:  12/16/2021 FINDINGS: The heart size and mediastinal contours are within normal limits. Pulmonary hyperinflation and emphysema. The visualized skeletal structures are unremarkable. IMPRESSION: Pulmonary hyperinflation and emphysema. No acute abnormality of the lungs. Electronically Signed   By: Delanna Ahmadi M.D.   On: 04/21/2022 09:13         Latest Ref Rng & Units 02/04/2022    9:57 AM  PFT Results  FVC-Pre L 2.24   FVC-Predicted Pre % 59   FVC-Post L 2.45   FVC-Predicted Post % 64   Pre FEV1/FVC % % 65   Post FEV1/FCV % % 64   FEV1-Pre L 1.45   FEV1-Predicted Pre % 49   FEV1-Post L 1.57   DLCO uncorrected ml/min/mmHg 12.03   DLCO UNC% % 51   DLVA Predicted % 58   TLC L 6.21   TLC % Predicted % 107   RV % Predicted % 144     No results found for: "NITRICOXIDE"      Assessment & Plan:   No problem-specific Assessment & Plan notes found for this encounter.     Rexene Edison, NP 05/06/2022

## 2022-05-06 NOTE — Patient Instructions (Signed)
Sputum culture today  Levaquin '500mg'$  daily for 1 week, take with food  Prednisone taper over next week.  Mucinex DM Twice daily  As needed  cough/congestion  Add Zyrtec '10mg'$  At bedtime   Saline nasal rinses Twice daily   Saline nasal gel At bedtime   Continue on Breztri 2 puffs Twice daily   Continue on Oxygen 2l/m At bedtime.  Albuterol inhaler or neb As needed   Follow up in with Dr. Patsey Berthold in 4 weeks and As needed   Please contact office for sooner follow up if symptoms do not improve or worsen or seek emergency care

## 2022-05-07 DIAGNOSIS — J449 Chronic obstructive pulmonary disease, unspecified: Secondary | ICD-10-CM

## 2022-05-07 NOTE — Telephone Encounter (Signed)
That is fine , I believe she is going out of town will need one of the [facility] devices

## 2022-05-07 NOTE — Telephone Encounter (Signed)
That is okay , if you cant cough up anything for sputum cx .  If you are able bring it when you can   Please contact office for sooner follow up if symptoms do not improve or worsen or seek emergency care

## 2022-05-12 ENCOUNTER — Other Ambulatory Visit: Payer: Self-pay | Admitting: Family Medicine

## 2022-05-12 DIAGNOSIS — B001 Herpesviral vesicular dermatitis: Secondary | ICD-10-CM

## 2022-05-12 NOTE — Progress Notes (Signed)
Agree with the details of the visit as noted by Elianys Parrett, NP.  C. Laura Lebaron Bautch, MD [facility] 

## 2022-05-13 ENCOUNTER — Ambulatory Visit (INDEPENDENT_AMBULATORY_CARE_PROVIDER_SITE_OTHER): Payer: Medicare Other | Admitting: Psychiatry

## 2022-05-13 ENCOUNTER — Other Ambulatory Visit: Payer: Self-pay | Admitting: Student in an Organized Health Care Education/Training Program

## 2022-05-13 ENCOUNTER — Encounter: Payer: Self-pay | Admitting: Psychiatry

## 2022-05-13 VITALS — BP 129/76 | HR 93 | Temp 97.9°F | Wt 175.6 lb

## 2022-05-13 DIAGNOSIS — F5101 Primary insomnia: Secondary | ICD-10-CM | POA: Diagnosis not present

## 2022-05-13 DIAGNOSIS — F411 Generalized anxiety disorder: Secondary | ICD-10-CM | POA: Diagnosis not present

## 2022-05-13 DIAGNOSIS — F1021 Alcohol dependence, in remission: Secondary | ICD-10-CM

## 2022-05-13 DIAGNOSIS — M47812 Spondylosis without myelopathy or radiculopathy, cervical region: Secondary | ICD-10-CM

## 2022-05-13 DIAGNOSIS — F3342 Major depressive disorder, recurrent, in full remission: Secondary | ICD-10-CM | POA: Diagnosis not present

## 2022-05-13 MED ORDER — ARIPIPRAZOLE 5 MG PO TABS: 5.0000 mg | ORAL_TABLET | Freq: Every morning | ORAL | 1 refills | Status: DC

## 2022-05-13 NOTE — Progress Notes (Signed)
Gabrielle Pineda OP Progress Note  05/13/2022 2:27 PM Gabrielle Pineda  MRN:  [id number]  Chief Complaint:  Chief Complaint  Patient presents with   Follow-up   Medication Refill   Anxiety   Insomnia   HPI: Gabrielle Pineda is a 66 year old Caucasian female on disability, lives in [place], has a history of depression, insomnia, GAD, alcohol use disorder in remission, history of back pain but history of spinal cord stimulator, paroxysmal ventricular tachycardia, hypercholesterolemia, chronic pain syndrome, GERD, was evaluated in office today.  Patient today reports that the higher dosage of mirtazapine she has been sleeping better.  She reports she goes to bed at around 11 PM and is able to sleep until 6 AM.  Patient does watch TV right before falling asleep.  Agreeable to start working on sleep hygiene.  Denies side effects.  Continues to be compliant on the venlafaxine, Abilify.  Denies any significant depression symptoms.  Reports anxiety is manageable.  Patient does report mouth sores, going on since the past few days, has upcoming appointment with primary care provider for evaluation.  Denies any suicidality, homicidality or perceptual disturbances.  Does have chronic back pain, currently under the care of pain management.  Denies any other concerns today.  Visit Diagnosis:    ICD-10-CM   1. Primary insomnia  F51.01     2. MDD (major depressive disorder), recurrent, in full remission ([place])  F33.42 ARIPiprazole (ABILIFY) 5 MG tablet    3. GAD (generalized anxiety disorder)  F41.1     4. Alcohol use disorder, moderate, in sustained remission ([place])  F10.21       Past Psychiatric History: Reviewed past psychiatric history from progress note on 12/26/2021.  Past trials of medications like sertraline, multiple other medications.  Does not remember all the names.  Past Medical History:  Past Medical History:  Diagnosis Date   Alcohol abuse    Anemia    Anxiety    Arthritis     Cervicalgia    Cirrhosis ([place]) 1994   COPD (chronic obstructive pulmonary disease) ([place])    Depression    Difficult intubation    has plates and screws in neck   Dyspnea    GERD (gastroesophageal reflux disease)    Headache    Heart murmur    on heard when pt is lying   Hypertension    Other and unspecified hyperlipidemia    Status post insertion of spinal cord stimulator    Tachycardia    d/t questionable anxiety happens every 5- 10 years, sees [facility]    Past Surgical History:  Procedure Laterality Date   ANTERIOR CERVICAL DECOMP/DISCECTOMY FUSION  2012, 2015, 2018   x3   AUGMENTATION MAMMAPLASTY Bilateral Speed RELEASE  11/11/2011   Procedure: CARPAL TUNNEL RELEASE;  Surgeon: Floyce Stakes, Pineda;  Location: [facility] NEURO ORS;  Service: Neurosurgery;  Laterality: Right;  Right Median Nerve Decompression   CARPAL TUNNEL RELEASE  2013   COLONOSCOPY WITH PROPOFOL N/A 11/01/2017   Procedure: COLONOSCOPY WITH PROPOFOL;  Surgeon: Manya Silvas, Pineda;  [facility] ENDOSCOPY;  Service: Endoscopy;  Laterality: N/A;   ESOPHAGOGASTRODUODENOSCOPY     LUMBAR LAMINECTOMY/DECOMPRESSION MICRODISCECTOMY N/A 08/15/2019   Procedure: Lumbar three to Sacral one Decompressive lumbar laminectomy;  Surgeon: Erline Levine, Pineda;  Location: [place];  Service: Neurosurgery;  Laterality: N/A;   neck disc surgery     plates and screws in neck x 2  ROTATOR CUFF REPAIR  06/16/2016   right shoulder    ROTATOR CUFF REPAIR     SPINAL CORD STIMULATOR INSERTION N/A 03/17/2021   Procedure: THORACIC SPINAL CORD STIMULATOR (PERCUTANEOUS) & PULSE GENERATOR PLACEMENT;  Surgeon: [name], Pineda;  Location: [facility] ORS;  Service: Neurosurgery;  Laterality: N/A;   TONSILLECTOMY AND ADENOIDECTOMY  1973    TOTAL HIP ARTHROPLASTY Right 04/15/2021   Procedure: TOTAL HIP ARTHROPLASTY ANTERIOR APPROACH;  Surgeon: Hessie Knows, Pineda;  Location: [facility] ORS;  Service:  Orthopedics;  Laterality: Right;    Family Psychiatric History: Reviewed family psychiatric history from progress note on 12/26/2021.  Family History:  Family History  Problem Relation Age of Onset   Alcohol abuse Mother    Bipolar disorder Mother    Suicidality Mother    Pancreatic cancer Father    Alcohol abuse Father    Drug abuse Sister    Alcohol abuse Sister    Depression Sister    Anesthesia problems Neg Hx    Breast cancer Neg Hx     Social History: Reviewed reviewed social history from progress note on 12/26/2021. Social History   Socioeconomic History   Marital status: Married    Spouse name: jerry   Number of children: 0   Years of education: Not on file   Highest education level: Bachelor's degree (e.g., BA, AB, BS)  Occupational History   Not on file  Tobacco Use   Smoking status: Former    Packs/day: 2.00    Years: 40.00    Total pack years: 80.00    Types: Cigarettes    Quit date: 10/30/2020    Years since quitting: 1.5   Smokeless tobacco: Never  Vaping Use   Vaping Use: Former   Devices: vape occasionally  Substance and Sexual Activity   Alcohol use: No    Comment: recovering alcoholic Since 0109    Drug use: No   Sexual activity: Not Currently  Other Topics Concern   Not on file  Social History Narrative   Married; full time; does not get regular exercise.    Social Determinants of Health   Financial Resource Strain: Low Risk  (11/03/2021)   Overall Financial Resource Strain (CARDIA)    Difficulty of Paying Living Expenses: Not very hard  Food Insecurity: No Food Insecurity (11/03/2021)   Hunger Vital Sign    Worried About Running Out of Food in the Last Year: Never true    Ran Out of Food in the Last Year: Never true  Transportation Needs: No Transportation Needs (11/03/2021)   PRAPARE - Hydrologist (Medical): No    Lack of Transportation (Non-Medical): No  Physical Activity: Inactive (11/03/2021)   Exercise  Vital Sign    Days of Exercise per Week: 0 days    Minutes of Exercise per Session: 0 min  Stress: Stress Concern Present (11/03/2021)   [place]    Feeling of Stress : To some extent  Social Connections: Moderately Isolated (11/03/2021)   Social Connection and Isolation Panel [NHANES]    Frequency of Communication with Friends and Family: More than three times a week    Frequency of Social Gatherings with Friends and Family: More than three times a week    Attends Religious Services: Never    Marine scientist or Organizations: No    Attends Archivist Meetings: Never    Marital Status: Married    Allergies:  Allergies  Allergen Reactions   Plaquenil [Hydroxychloroquine] Rash    Metabolic Disorder Labs: Lab Results  Component Value Date   HGBA1C 5.3 06/26/2021   No results found for: "PROLACTIN" Lab Results  Component Value Date   CHOL 153 11/17/2021   TRIG 202 (H) 11/17/2021   HDL 40 11/17/2021   CHOLHDL 3.8 11/17/2021   LDLCALC 79 11/17/2021   LDLCALC 50 06/26/2021   Lab Results  Component Value Date   TSH 3.673 03/10/2022   TSH 2.170 11/17/2021    Therapeutic Level Labs: No results found for: "LITHIUM" No results found for: "VALPROATE" No results found for: "CBMZ"  Current Medications: Current Outpatient Medications  Medication Sig Dispense Refill   acyclovir ointment (ZOVIRAX) 5 % SMARTSIG:Topical Every 3 Hours     albuterol (PROVENTIL) (2.5 MG/3ML) 0.083% nebulizer solution Take 3 mLs (2.5 mg total) by nebulization every 4 (four) hours as needed for wheezing or shortness of breath. 225 mL 2   albuterol (VENTOLIN HFA) 108 (90 Base) MCG/ACT inhaler INHALE TWO PUFFS BY MOUTH INTO LUNGS every SIX hours AS NEEDED FOR WHEEZING AND/OR SHORTNESS OF BREATH 8.5 g 2   atorvastatin (LIPITOR) 40 MG tablet Take 1 tablet (40 mg total) by mouth every morning. 90 tablet 3    Budeson-Glycopyrrol-Formoterol (BREZTRI AEROSPHERE) 160-9-4.8 MCG/ACT AERO Inhale 2 puffs into the lungs in the morning and at bedtime. 10.7 g 6   carvedilol (COREG) 12.5 MG tablet TAKE ONE TABLET BY MOUTH EVERY MORNING and TAKE ONE TABLET BY MOUTH EVERYDAY AT BEDTIME 180 tablet 3   ezetimibe (ZETIA) 10 MG tablet Take 1 tablet (10 mg total) by mouth every morning. 90 tablet 3   leflunomide (ARAVA) 20 MG tablet Take 20 mg by mouth at bedtime.     LINZESS 145 MCG CAPS capsule Take 145 mcg by mouth daily before breakfast.     meloxicam (MOBIC) 15 MG tablet Take 15 mg by mouth at bedtime.     mirtazapine (REMERON) 15 MG tablet Take 1 tablet (15 mg total) by mouth at bedtime. Dose change 90 tablet 0   montelukast (SINGULAIR) 10 MG tablet Take 1 tablet (10 mg total) by mouth at bedtime. 30 tablet 11   Multiple Vitamin (MULTIVITAMIN WITH MINERALS) TABS tablet Take 1 tablet by mouth daily.     omeprazole (PRILOSEC) 40 MG capsule Take 1 capsule (40 mg total) by mouth 2 (two) times daily. 180 capsule 1   [START ON 06/25/2022] oxyCODONE-acetaminophen (PERCOCET) 5-325 MG tablet Take 1 tablet by mouth every 8 (eight) hours as needed for severe pain. Must last 30 days. 90 tablet 0   potassium chloride SA (KLOR-CON M) 20 MEQ tablet Take 1 tablet (20 mEq total) by mouth daily with breakfast. 90 tablet 1   sulfaSALAzine (AZULFIDINE) 500 MG EC tablet Take 1,000 mg by mouth 2 (two) times daily.     topiramate (TOPAMAX) 25 MG tablet Take 1 tablet (25 mg total) by mouth 2 (two) times daily. 60 tablet 11   valACYclovir (VALTREX) 1000 MG tablet TAKE TWO TABLETS BY MOUTH twice daily FOR 5 DAYS AS NEEDED FOR FEVER blisters 30 tablet 1   venlafaxine XR (EFFEXOR-XR) 75 MG 24 hr capsule Take 3 capsules (225 mg total) by mouth daily with breakfast. TAKE 3 CAPSULES BY MOUTH ONCE DAILY WITH BREAKFAST 270 capsule 1   XIIDRA 5 % SOLN Place 1 drop into both eyes daily.     ARIPiprazole (ABILIFY) 5 MG tablet Take 1 tablet (5 mg total)  by  mouth in the morning. 90 tablet 1   gabapentin (NEURONTIN) 400 MG capsule Take 1 capsule (400 mg total) by mouth 2 (two) times daily. 60 capsule 5   No current facility-administered medications for this visit.     Musculoskeletal: Strength & Muscle Tone: within normal limits Gait & Station: normal Patient leans: N/A  Psychiatric Specialty Exam: Review of Systems  HENT:         Mouth sores  Musculoskeletal:  Positive for back pain.  Psychiatric/Behavioral:  The patient is nervous/anxious.   All other systems reviewed and are negative.   Blood pressure 129/76, pulse 93, temperature 97.9 F (36.6 C), temperature source Temporal, weight 175 lb 9.6 oz (79.7 kg).Body mass index is 25.93 kg/m.  General Appearance: Casual  Eye Contact:  Fair  Speech:  Clear and Coherent  Volume:  Normal  Mood:  Anxious improving  Affect:  Congruent  Thought Process:  Goal Directed and Descriptions of Associations: Intact  Orientation:  Full (Time, Place, and Person)  Thought Content: Logical   Suicidal Thoughts:  No  Homicidal Thoughts:  No  Memory:  Immediate;   Fair Recent;   Fair Remote;   Fair  Judgement:  Fair  Insight:  Fair  Psychomotor Activity:  Normal  Concentration:  Concentration: Fair and Attention Span: Fair  Recall:  [employer] of Knowledge: Fair  Language: Fair  Akathisia:  No  Handed:  Right  AIMS (if indicated): done  Assets:  Communication Skills Desire for Improvement Housing Talents/Skills Transportation  ADL's:  Intact  Cognition: WNL  Sleep:  Fair   Screenings: [place] Office Visit from 05/13/2022 in [place] Office Visit from 03/31/2022 in [place] Office Visit from 01/22/2022 in [place] Office Visit from 12/26/2021 in [place] Total Score 0 0 0 0      [place] Visit from 05/13/2022 in  [place] from 03/31/2022 in [place] Visit from 01/22/2022 in [place] from 12/26/2021 in [place] from 12/04/2019 in [place]  Total GAD-7 Score 0 2 0 0 9      PHQ2-9    [place] Visit from 05/13/2022 in [place] Nutrition from 04/22/2022 in [place] Office Visit from 04/21/2022 in [place] Visit from 04/07/2022 in [place] Office Visit from 03/31/2022 in [place]  PHQ-2 Total Score 0 0 0 0 0  PHQ-9 Total Score 1 -- 2 -- 2      [place] Office Visit from 05/13/2022 in [place] Office Visit from 03/31/2022 in [place] Office Visit from 01/22/2022 in [place] No Risk        Assessment and Plan: Gabrielle Pineda is a 66 year old Caucasian female, married, on disability, lives in [place], has a history of GAD, MDD, sleep problems, COPD, gastroesophageal reflux disease, hypertension, heart murmur, history of back pain on spinal cord stimulator, paroxysmal supraventricular tachycardia and multiple other medications, presented for medication management.  Currently sleep improved, mood symptoms continues to be stable on the current medication regimen.  Plan as noted below.  Plan  MDD in full remission Venlafaxine 225 mg p.o. daily with  breakfast Abilify 5 mg p.o. daily in the morning  GAD-stable Venlafaxine 225 mg p.o. daily  Primary insomnia-stable Mirtazapine 15 mg p.o. nightly Patient advised to continue sleep hygiene.  Alcohol use disorder in remission Sober since 1994.  Patient to follow up with  primary care provider for her mouth sores.  Follow-up in clinic in 3 months or sooner if needed.   This note was generated in part or whole with voice recognition software. Voice recognition is usually quite accurate but there are transcription errors that can and very often do occur. I apologize for any typographical errors that were not detected and corrected.     Ursula Alert, Pineda 05/13/2022, 2:27 PM

## 2022-05-13 NOTE — Progress Notes (Unsigned)
I,Sha'taria Tyson,acting as a Education administrator for [employer], PA-C.,have documented all relevant documentation on the behalf of Mikey Kirschner, PA-C,as directed by  Mikey Kirschner, PA-C while in the presence of Mikey Kirschner, PA-C.   Established patient visit   Patient: Gabrielle Pineda   DOB: September 17, 1955   66 y.o. Female  MRN: [id number] Visit Date: 05/14/2022  Today's healthcare provider: Mikey Kirschner, PA-C  Cc. Chronic care f/u    Subjective    HPI  Pt reports flare of mouth ulcers since her COPD flare. Admits to burning pain, worsened when eating.  Follow up for COPD  The patient was last seen for this 3 weeks ago. Changes made at last visit include continue treatment.  She reports excellent compliance with treatment. She feels that condition is Unchanged. She is not having side effects.   -----------------------------------------------------------------------------------------  -  Hypertension, follow-up  BP Readings from Last 3 Encounters:  05/14/22 130/77  05/06/22 128/80  04/21/22 122/62   Wt Readings from Last 3 Encounters:  05/14/22 177 lb 11.2 oz (80.6 kg)  05/06/22 172 lb 14.4 oz (78.4 kg)  04/22/22 172 lb 1.6 oz (78.1 kg)     She was last seen for hypertension 5 months ago.  BP at that visit was 135/85. Management since that visit includes continue current treatment.  She reports excellent compliance with treatment. She is not having side effects.  She is following a Regular diet. She is not exercising. She does not smoke.  Use of agents associated with hypertension: none.   Outside blood pressures are checked on occassions Symptoms: No chest pain No chest pressure  No palpitations No syncope  No dyspnea No orthopnea  No paroxysmal nocturnal dyspnea No lower extremity edema   Pertinent labs Lab Results  Component Value Date   CHOL 153 11/17/2021   HDL 40 11/17/2021   LDLCALC 79 11/17/2021   TRIG 202 (H) 11/17/2021   CHOLHDL 3.8  11/17/2021   Lab Results  Component Value Date   NA 133 (L) 03/10/2022   K 3.5 03/10/2022   CREATININE 1.02 (H) 03/10/2022   GFRNONAA >60 03/10/2022   GLUCOSE 108 (H) 03/10/2022   TSH 3.673 03/10/2022     The 10-year ASCVD risk score (Arnett DK, et al., 2019) is: 13.8%  ---------------------------------------------------------------------------------------------------   Medications: Outpatient Medications Prior to Visit  Medication Sig   acyclovir ointment (ZOVIRAX) 5 % SMARTSIG:Topical Every 3 Hours   albuterol (PROVENTIL) (2.5 MG/3ML) 0.083% nebulizer solution Take 3 mLs (2.5 mg total) by nebulization every 4 (four) hours as needed for wheezing or shortness of breath.   albuterol (VENTOLIN HFA) 108 (90 Base) MCG/ACT inhaler INHALE TWO PUFFS BY MOUTH INTO LUNGS every SIX hours AS NEEDED FOR WHEEZING AND/OR SHORTNESS OF BREATH   ARIPiprazole (ABILIFY) 5 MG tablet Take 1 tablet (5 mg total) by mouth in the morning.   atorvastatin (LIPITOR) 40 MG tablet Take 1 tablet (40 mg total) by mouth every morning.   Budeson-Glycopyrrol-Formoterol (BREZTRI AEROSPHERE) 160-9-4.8 MCG/ACT AERO Inhale 2 puffs into the lungs in the morning and at bedtime.   carvedilol (COREG) 12.5 MG tablet TAKE ONE TABLET BY MOUTH EVERY MORNING and TAKE ONE TABLET BY MOUTH EVERYDAY AT BEDTIME   ezetimibe (ZETIA) 10 MG tablet Take 1 tablet (10 mg total) by mouth every morning.   leflunomide (ARAVA) 20 MG tablet Take 20 mg by mouth at bedtime.   LINZESS 145 MCG CAPS capsule Take 145 mcg by mouth daily before breakfast.  meloxicam (MOBIC) 15 MG tablet Take 15 mg by mouth at bedtime.   mirtazapine (REMERON) 15 MG tablet Take 1 tablet (15 mg total) by mouth at bedtime. Dose change   montelukast (SINGULAIR) 10 MG tablet Take 1 tablet (10 mg total) by mouth at bedtime.   Multiple Vitamin (MULTIVITAMIN WITH MINERALS) TABS tablet Take 1 tablet by mouth daily.   omeprazole (PRILOSEC) 40 MG capsule Take 1 capsule (40 mg total)  by mouth 2 (two) times daily.   [START ON 06/25/2022] oxyCODONE-acetaminophen (PERCOCET) 5-325 MG tablet Take 1 tablet by mouth every 8 (eight) hours as needed for severe pain. Must last 30 days.   potassium chloride SA (KLOR-CON M) 20 MEQ tablet Take 1 tablet (20 mEq total) by mouth daily with breakfast.   sulfaSALAzine (AZULFIDINE) 500 MG EC tablet Take 1,000 mg by mouth 2 (two) times daily.   topiramate (TOPAMAX) 25 MG tablet Take 1 tablet (25 mg total) by mouth 2 (two) times daily.   valACYclovir (VALTREX) 1000 MG tablet TAKE TWO TABLETS BY MOUTH twice daily FOR 5 DAYS AS NEEDED FOR FEVER blisters   venlafaxine XR (EFFEXOR-XR) 75 MG 24 hr capsule Take 3 capsules (225 mg total) by mouth daily with breakfast. TAKE 3 CAPSULES BY MOUTH ONCE DAILY WITH BREAKFAST   XIIDRA 5 % SOLN Place 1 drop into both eyes daily.   gabapentin (NEURONTIN) 400 MG capsule Take 1 capsule (400 mg total) by mouth 2 (two) times daily.   No facility-administered medications prior to visit.    Review of Systems  Constitutional:  Positive for fatigue. Negative for fever.  Respiratory:  Negative for cough and shortness of breath.   Cardiovascular:  Negative for chest pain and leg swelling.  Gastrointestinal:  Negative for abdominal pain.  Neurological:  Negative for dizziness and headaches.      Objective    Blood pressure 130/77, pulse 75, height '5\' 9"'$  (1.753 m), weight 177 lb 11.2 oz (80.6 kg), SpO2 97 %.   Physical Exam Constitutional:      General: She is awake.     Appearance: She is well-developed.  HENT:     Head: Normocephalic.     Mouth/Throat:     Mouth: Oral lesions present.     Comments: Flat erythematous small ulcers lower soft palate not present in throat Eyes:     Conjunctiva/sclera: Conjunctivae normal.  Cardiovascular:     Rate and Rhythm: Normal rate and regular rhythm.     Heart sounds: Normal heart sounds.  Pulmonary:     Effort: Pulmonary effort is normal.     Breath sounds: Normal  breath sounds.  Musculoskeletal:     Right lower leg: No edema.     Left lower leg: No edema.  Skin:    General: Skin is warm.  Neurological:     Mental Status: She is alert and oriented to person, place, and time.  Psychiatric:        Attention and Perception: Attention normal.        Mood and Affect: Mood normal.        Speech: Speech normal.        Behavior: Behavior is cooperative.      No results found for any visits on 05/14/22.  Assessment & Plan     Problem List Items Addressed This Visit       Cardiovascular and Mediastinum   Essential hypertension - Primary    Slightly elevated in office but typically stable, recent other ov  all wnr.  Continue medications Reviewed last cmp f/u 6 mo        Respiratory   COPD (chronic obstructive pulmonary disease) (HCC)    F/b pulmonology Pt is clinically improved from recent exacerbation       Relevant Medications   magic mouthwash (nystatin, hydrocortisone, diphenhydrAMINE) suspension     Digestive   Cold sore    aphthous ulcers in mouth during recent copd flare rx magic mouthwash      Relevant Medications   magic mouthwash (nystatin, hydrocortisone, diphenhydrAMINE) suspension   Other Visit Diagnoses     Need for immunization against influenza       Relevant Orders   Flu Vaccine QUAD High Dose(Fluad) (Completed)        Return in about 6 months (around 11/12/2022) for CPE.      I, Mikey Kirschner, PA-C have reviewed all documentation for this visit. The documentation on  05/14/2022 for the exam, diagnosis, procedures, and orders are all accurate and complete.  Mikey Kirschner, PA-C [facility] [address] [place], [place], [address] Office: [phone] Fax: [place]

## 2022-05-14 ENCOUNTER — Other Ambulatory Visit: Payer: Self-pay | Admitting: Student in an Organized Health Care Education/Training Program

## 2022-05-14 ENCOUNTER — Ambulatory Visit (INDEPENDENT_AMBULATORY_CARE_PROVIDER_SITE_OTHER): Payer: Medicare Other | Admitting: Physician Assistant

## 2022-05-14 ENCOUNTER — Encounter: Payer: Self-pay | Admitting: Physician Assistant

## 2022-05-14 ENCOUNTER — Other Ambulatory Visit: Payer: Self-pay | Admitting: Psychiatry

## 2022-05-14 VITALS — BP 130/77 | HR 75 | Ht 69.0 in | Wt 177.7 lb

## 2022-05-14 DIAGNOSIS — Z23 Encounter for immunization: Secondary | ICD-10-CM | POA: Diagnosis not present

## 2022-05-14 DIAGNOSIS — J449 Chronic obstructive pulmonary disease, unspecified: Secondary | ICD-10-CM | POA: Diagnosis not present

## 2022-05-14 DIAGNOSIS — I10 Essential (primary) hypertension: Secondary | ICD-10-CM | POA: Diagnosis not present

## 2022-05-14 DIAGNOSIS — B001 Herpesviral vesicular dermatitis: Secondary | ICD-10-CM | POA: Diagnosis not present

## 2022-05-14 DIAGNOSIS — F5101 Primary insomnia: Secondary | ICD-10-CM

## 2022-05-14 DIAGNOSIS — F411 Generalized anxiety disorder: Secondary | ICD-10-CM

## 2022-05-14 MED ORDER — NYSTATIN 100000 UNIT/ML MT SUSP: 5.0000 mL | Freq: Three times a day (TID) | OROMUCOSAL | 0 refills | Status: DC

## 2022-05-14 NOTE — Assessment & Plan Note (Signed)
aphthous ulcers in mouth during recent copd flare rx magic mouthwash

## 2022-05-14 NOTE — Assessment & Plan Note (Signed)
Slightly elevated in office but typically stable, recent other ov all wnr.  Continue medications Reviewed last cmp f/u 6 mo

## 2022-05-14 NOTE — Assessment & Plan Note (Signed)
F/b pulmonology Pt is clinically improved from recent exacerbation

## 2022-05-15 ENCOUNTER — Telehealth: Payer: Self-pay

## 2022-05-15 ENCOUNTER — Telehealth: Payer: Self-pay | Admitting: Physician Assistant

## 2022-05-15 ENCOUNTER — Ambulatory Visit: Payer: Medicare Other | Admitting: Family Medicine

## 2022-05-15 ENCOUNTER — Ambulatory Visit: Payer: Medicare Other | Admitting: Physician Assistant

## 2022-05-15 NOTE — Progress Notes (Signed)
Chronic Care Management Pharmacy Assistant   Name: Gabrielle Pineda  MRN: [id number] DOB: Jan 27, 1956  Reason for Encounter: Medication Review/Medication Coordination for [facility]   Recent office visits:  05/14/2022 Mikey Kirschner, PA-C (PCP Office Visit) for HTN- Started: Magic Mouthwash, No orders placed, patient to follow-up in 6 months  04/21/2022 Mikey Kirschner, PA-C (PCP Office Visit) for COPD- Started: Prednisone 10 mg taper, Chest XR ordered,   Recent consult visits:  05/13/2022 Ursula Alert, MD (Behavioral Health) Unsure of what changes were made as I did not break the glass on this note  05/06/2022 Carolynn Serve, NP (Pulmonary) for COPD Follow-up- Started: Levofloxacin 500 mg daily, Prednisone 10 mg taper, Budeson-Glycopyrrol-Fromoterol 160-9-4.8 mcg inhaler, Stopped: Amoxicillin, Respiratory or Resp and Sputum Culture order placed, patient to follow-up in 4 weeks  04/27/2022 Gardiner Barefoot, DPM (Podiatry) for Routine Post Op-No medication changes noted, No orders placed, Patient to follow-up in 3 months  Hospital visits:  None in previous 6 months  Medications: Outpatient Encounter Medications as of 05/15/2022  Medication Sig   acyclovir ointment (ZOVIRAX) 5 % SMARTSIG:Topical Every 3 Hours   albuterol (PROVENTIL) (2.5 MG/3ML) 0.083% nebulizer solution Take 3 mLs (2.5 mg total) by nebulization every 4 (four) hours as needed for wheezing or shortness of breath.   albuterol (VENTOLIN HFA) 108 (90 Base) MCG/ACT inhaler INHALE TWO PUFFS BY MOUTH INTO LUNGS every SIX hours AS NEEDED FOR WHEEZING AND/OR SHORTNESS OF BREATH   ARIPiprazole (ABILIFY) 5 MG tablet Take 1 tablet (5 mg total) by mouth in the morning.   atorvastatin (LIPITOR) 40 MG tablet Take 1 tablet (40 mg total) by mouth every morning.   Budeson-Glycopyrrol-Formoterol (BREZTRI AEROSPHERE) 160-9-4.8 MCG/ACT AERO Inhale 2 puffs into the lungs in the morning and at bedtime.   carvedilol (COREG) 12.5 MG  tablet TAKE ONE TABLET BY MOUTH EVERY MORNING and TAKE ONE TABLET BY MOUTH EVERYDAY AT BEDTIME   ezetimibe (ZETIA) 10 MG tablet Take 1 tablet (10 mg total) by mouth every morning.   gabapentin (NEURONTIN) 400 MG capsule Take 1 capsule (400 mg total) by mouth 2 (two) times daily.   leflunomide (ARAVA) 20 MG tablet Take 20 mg by mouth at bedtime.   LINZESS 145 MCG CAPS capsule Take 145 mcg by mouth daily before breakfast.   magic mouthwash (nystatin, hydrocortisone, diphenhydrAMINE) suspension Swish and spit 5 mLs 3 (three) times daily.   meloxicam (MOBIC) 15 MG tablet Take 15 mg by mouth at bedtime.   mirtazapine (REMERON) 15 MG tablet Take 1 tablet (15 mg total) by mouth at bedtime. Dose change   montelukast (SINGULAIR) 10 MG tablet Take 1 tablet (10 mg total) by mouth at bedtime.   Multiple Vitamin (MULTIVITAMIN WITH MINERALS) TABS tablet Take 1 tablet by mouth daily.   omeprazole (PRILOSEC) 40 MG capsule Take 1 capsule (40 mg total) by mouth 2 (two) times daily.   [START ON 06/25/2022] oxyCODONE-acetaminophen (PERCOCET) 5-325 MG tablet Take 1 tablet by mouth every 8 (eight) hours as needed for severe pain. Must last 30 days.   potassium chloride SA (KLOR-CON M) 20 MEQ tablet Take 1 tablet (20 mEq total) by mouth daily with breakfast.   sulfaSALAzine (AZULFIDINE) 500 MG EC tablet Take 1,000 mg by mouth 2 (two) times daily.   topiramate (TOPAMAX) 25 MG tablet Take 1 tablet (25 mg total) by mouth 2 (two) times daily.   valACYclovir (VALTREX) 1000 MG tablet TAKE TWO TABLETS BY MOUTH twice daily FOR 5 DAYS AS NEEDED FOR FEVER  blisters   venlafaxine XR (EFFEXOR-XR) 75 MG 24 hr capsule Take 3 capsules (225 mg total) by mouth daily with breakfast. TAKE 3 CAPSULES BY MOUTH ONCE DAILY WITH BREAKFAST   XIIDRA 5 % SOLN Place 1 drop into both eyes daily.   No facility-administered encounter medications on file as of 05/15/2022.   Care Gaps: None ID  Star Rating Drugs: Atorvastatin 40 mg last filled on  04/23/2022 for a 30-Day supply with [facility]  BP Readings from Last 3 Encounters:  05/14/22 130/77  05/06/22 128/80  04/21/22 122/62    Lab Results  Component Value Date   HGBA1C 5.3 06/26/2021    Patient obtains medications through Adherence Packaging  30 Days   Last adherence delivery included:  Meloxicam 15 mg tablet- Take one tablet by mouth once daily (Bedtime) Ezetimibe (Zetia) 10 mg tablet Take one tablet by mouth daily (Breakfast) Atorvastatin 40 mg tablet- Take one tablet by mouth daily (Breakfast) Carvedilol 12.5 mg tablet - Take on tablet by mouth twice daily (Breakfast, Bedtime) Mirtazapine 15 mg tablet-Take one tablet daily (Bedtime) Topiramate 25 mg tablet-Take one tablet by mouth twice daily (Breakfast, Bedtime) Gabapentin 400 mg tablet- Take one tablet by mouth twice daily (Breakfast, Bedtime) Linzess 145 mcg- Take one tablet by mouth daily (Before Breakfast) (Vials) Venlafaxine ER 75 mg capsule- Take three capsules by mouth daily (Breakfast) Omeprazole 40 mg- Take one capsule by mouth two times a day (Breakfast, Bedtime) Potassium 20 mEq - Take one capsule daily (Breakfast) Aripiprazole 5 mg tablet- Take one tablet by mouth daily (Breakfast) Leflunomide 20 mg tablet- Take one tablet by mouth once daily (Bedtime) Montelukast (Singulair) 10 mg tablet 1 tablet daily (Bedtime) Breztri Inhaler 160-9-4.8 mcg 2 puffs in twice daily Sulfasalazine 500 mg take 1000 mg by mouth twice daily (Breakfast, Bedtime)  Patient declined medications for September: Albuterol 90 mcg 2 puffs every 6 hours prn - PRN Medication patient has ample supply at this time  Patient is due for next adherence delivery on: 05/27/2022 [facility].  Called patient and reviewed medications and coordinated delivery.  This delivery to include: Meloxicam 15 mg tablet- Take one tablet by mouth once daily (Bedtime) Ezetimibe (Zetia) 10 mg tablet Take one tablet by mouth daily  (Breakfast) Atorvastatin 40 mg tablet- Take one tablet by mouth daily (Breakfast) Carvedilol 12.5 mg tablet - Take on tablet by mouth twice daily (Breakfast, Bedtime) Mirtazapine 15 mg tablet-Take one tablet daily (Bedtime) Topiramate 25 mg tablet-Take one tablet by mouth twice daily (Breakfast, Bedtime) Gabapentin 400 mg tablet- Take one tablet by mouth twice daily (Breakfast, Bedtime) Linzess 145 mcg- Take one tablet by mouth daily (Before Breakfast) (Vials) Venlafaxine ER 75 mg capsule- Take three capsules by mouth daily (Breakfast) Omeprazole 40 mg- Take one capsule by mouth two times a day (Breakfast, Bedtime) Potassium 20 mEq - Take one capsule daily (Breakfast) Aripiprazole 5 mg tablet- Take one tablet by mouth daily (Breakfast) Leflunomide 20 mg tablet- Take one tablet by mouth once daily (Bedtime) Montelukast (Singulair) 10 mg tablet 1 tablet daily (Bedtime) Sulfasalazine 500 mg take 1000 mg by mouth twice daily (Breakfast, Bedtime) Albuterol 90 mcg 2 puffs every 6 hours prn -  Patient declined the following medications for October: Breztri Inhaler 160-9-4.8 mcg 2 puffs in twice daily-Per patient her Pulmonary provider gave her samples  Patient needs refills for Topiramate 25 mg and Gabapentin 400 mg both are specialist medications. Refill request sent by [facility]  Confirmed delivery date of 05/27/2022 [facility],  advised patient that pharmacy will contact them the morning of delivery.  I spoke with the patient, and she has been having COPD Exacerbation symptoms. She recently followed-up with PCP and Pulmonary. Pulmonary started patient on a Taper pack and some ABX. Patient reports today she is feeling better. She is taking all medications as directed, and is using oxygen. Patient reports she is not 100% but much better than what she was.  Patient reports she will follow-up again with Pulmonary on 10/09. Patient has no concerns or issues today.  Patient has a follow-up  appointment with Junius Argyle, CPP on 07/20/2022 @ 1100.  Lynann Bologna, CPA/CMA Clinical Pharmacist Assistant Phone: [phone]

## 2022-05-15 NOTE — Telephone Encounter (Unsigned)
Courtney from [facility] called b/c they need a formula breakdown for the magic mouthwash (nystatin, hydrocortisone, diphenhydrAMINE) suspension  Ok to call them to advise.  [place] (N), [place] [facility] [place]

## 2022-05-18 ENCOUNTER — Other Ambulatory Visit: Payer: Self-pay | Admitting: Physician Assistant

## 2022-05-18 MED ORDER — NYSTATIN 100000 UNIT/ML MT SUSP: 5.0000 mL | Freq: Three times a day (TID) | OROMUCOSAL | 0 refills | Status: DC | PRN

## 2022-05-19 ENCOUNTER — Ambulatory Visit: Payer: Medicare Other | Admitting: Family Medicine

## 2022-05-22 ENCOUNTER — Telehealth: Payer: Self-pay | Admitting: Student in an Organized Health Care Education/Training Program

## 2022-05-22 ENCOUNTER — Other Ambulatory Visit: Payer: Self-pay | Admitting: Physician Assistant

## 2022-05-22 ENCOUNTER — Other Ambulatory Visit: Payer: Self-pay | Admitting: *Deleted

## 2022-05-22 DIAGNOSIS — M47812 Spondylosis without myelopathy or radiculopathy, cervical region: Secondary | ICD-10-CM

## 2022-05-22 NOTE — Telephone Encounter (Signed)
Pharmacy stated that they having been trying to filled patient prescriptions for Gabapentin and Topiramate and it keeps rejecting it. Pharmacy isn't sure of why.Please give pharmacy a call. Thanks

## 2022-05-22 NOTE — Telephone Encounter (Signed)
Spoke with pharmacy, patient needs refills on these. Refill request sent to Dr. Holley Raring.

## 2022-05-25 ENCOUNTER — Ambulatory Visit: Payer: Medicare Other | Admitting: Pulmonary Disease

## 2022-05-25 MED ORDER — TOPIRAMATE 25 MG PO TABS: 25.0000 mg | ORAL_TABLET | Freq: Two times a day (BID) | ORAL | 11 refills | Status: DC

## 2022-05-25 MED ORDER — GABAPENTIN 400 MG PO CAPS: 400.0000 mg | ORAL_CAPSULE | Freq: Two times a day (BID) | ORAL | 5 refills | Status: DC

## 2022-05-25 NOTE — Telephone Encounter (Signed)
Patient notified

## 2022-05-27 ENCOUNTER — Ambulatory Visit: Payer: Medicare Other | Admitting: Pulmonary Disease

## 2022-05-28 ENCOUNTER — Ambulatory Visit: Payer: Medicare Other | Admitting: Pulmonary Disease

## 2022-05-28 ENCOUNTER — Encounter: Payer: Self-pay | Admitting: Pulmonary Disease

## 2022-05-28 VITALS — BP 118/80 | HR 74 | Temp 97.7°F | Ht 69.0 in | Wt 174.0 lb

## 2022-05-28 DIAGNOSIS — J449 Chronic obstructive pulmonary disease, unspecified: Secondary | ICD-10-CM

## 2022-05-28 DIAGNOSIS — J4489 Other specified chronic obstructive pulmonary disease: Secondary | ICD-10-CM

## 2022-05-28 DIAGNOSIS — R0602 Shortness of breath: Secondary | ICD-10-CM

## 2022-05-28 DIAGNOSIS — G4736 Sleep related hypoventilation in conditions classified elsewhere: Secondary | ICD-10-CM

## 2022-05-28 DIAGNOSIS — J441 Chronic obstructive pulmonary disease with (acute) exacerbation: Secondary | ICD-10-CM

## 2022-05-28 LAB — NITRIC OXIDE: Nitric Oxide: 19

## 2022-05-28 MED ORDER — METHYLPREDNISOLONE 4 MG PO TBPK: ORAL_TABLET | ORAL | 0 refills | Status: DC

## 2022-05-28 NOTE — Progress Notes (Signed)
Subjective:    Patient ID: Gabrielle Pineda, female    DOB: [birth year], 66 y.o.   MRN: [id number] Patient Care Team: [name], PA-C as PCP - General (Physician Assistant) Antonieta Iba, MD as PCP - Cardiology (Cardiology) Gaspar Cola, [facility] (Pharmacist) Jomarie Longs, MD as Consulting Physician (Psychiatry) Edward Jolly, MD as Consulting Physician (Pain Medicine)  Chief Complaint  Patient presents with   Follow-up    COPD. SOB with exertion. Occasional wheezing. Cough with no sputum.    HPI Gabrielle Pineda is a 66 year old patient with severe COPD who follows after an acute visit with Rubye Oaks, NP on 07 May 2022.  At that time she was treated with Levaquin and prednisone taper.  Overall she feels better and notes that Markus Daft is working well as a maintenance medication.  She tolerated her Levaquin and prednisone taper.  She still notices shortness of breath with exertion and wheezing, she has had a dry cough that has been nonproductive.  She does not endorse any fevers, chills or sweats.  No chest pain.  No orthopnea or paroxysmal nocturnal dyspnea.  Does not endorse any other symptomatology.  DATA 07/29/2017 simple spirometry: Performed for disability determination FEV1 2.26 L or 79% predicted FVC 3.05 L or 86% predicted FEV1/FVC 74% 11/01/2020 [facility]): Normal left ventricle, LVEF over 55%, right ventricle normal in size with normal systolic function, mild tricuspid regurgitation, no pulmonary hypertension, no wall motion abnormalities 12/18/2021 CT high-res chest: No evidence of fibrotic interstitial lung disease, moderate emphysema and diffuse bronchial wall thickening, mild tracheobronchomalacia, irregular nodules of the left upper lobe and dependent right lower lobe measuring 8 mm, nonspecific.  Fine centrilobular nodularity concentrated in the lung bases consistent with smoking-related respiratory bronchiolitis, coronary artery disease 02/04/2022  PFTs: FEV1 1.45 L or 49% predicted, FVC 2.24 L or 59% predicted, FEV1/FVC 65%, lung volumes normal with modest air trapping,moderately to severe diffusion defect.  Flow volume loop consistent with airway obstruction, no evidence of fixed or variable upper airway obstruction.  There is significant decline in lung function when compared to the 2018 simple spirometry measurements 02/13/2022 overnight oximetry: Show desaturations of less than 88% for over 2 hours patient qualified for nocturnal oxygen   Review of Systems A 10 point review of systems was performed and it is as noted above otherwise negative.  Patient Active Problem List   Diagnosis Date Noted   Localized edema 11/23/2022   MDD (major depressive disorder), recurrent, in full remission 03/31/2022   Macrocytic anemia 02/20/2022   Overweight (BMI 25.0-29.9) 02/20/2022   COPD (chronic obstructive pulmonary disease) 02/06/2022   Lung nodules 02/06/2022   Allergic rhinitis 02/06/2022   GAD (generalized anxiety disorder) 12/26/2021   Alcohol use disorder, moderate, in sustained remission 12/26/2021   Need for vaccination against Streptococcus pneumoniae 11/17/2021   Sleep disorder 10/23/2021   Iron deficiency anemia 06/26/2021   Depression, major, single episode, moderate 06/26/2021   Status post total hip replacement, right 04/15/2021   Chronic radicular lumbar pain 10/03/2020   Postlaminectomy syndrome, lumbar region 06/03/2020   Moderate episode of recurrent major depressive disorder 12/04/2019   Spondylosis of cervical region without myelopathy or radiculopathy 09/21/2019   DDD (degenerative disc disease), cervical 09/21/2019   Cervicalgia 09/21/2019   Cervical fusion syndrome 09/21/2019   Chronic pain syndrome 09/21/2019   Spinal stenosis, lumbar region, with neurogenic claudication 08/15/2019   History of adenomatous polyp of colon 11/04/2017   Cervical radiculopathy 08/02/2017   Neuropathy 08/02/2017   Essential  hypertension 07/28/2017   Closed compression fracture of L5 lumbar vertebra 07/07/2017   Sacral insufficiency fracture with routine healing 07/07/2017   Family history of malignant neoplasm of pancreas 04/03/2015   Hypercholesteremia 04/03/2015   Disorder of iron metabolism 04/03/2015   Carpal tunnel syndrome 03/29/2015   Acid reflux 10/11/2014   Closed fracture of distal phalanx of thumb 08/15/2014   Arthritis, degenerative 01/30/2014   Arthritis or polyarthritis, rheumatoid 01/30/2014   Paroxysmal supraventricular tachycardia 09/22/2010   B-complex deficiency 09/09/2007   CN (constipation) 06/26/2007   Clinical depression 06/26/2007   Cold sore 06/26/2007   H/O alcohol abuse 06/26/2007   Cannot sleep 06/26/2007   Localized osteoarthrosis, hand 06/26/2007   Menopausal symptom 06/26/2007   Social History   Tobacco Use   Smoking status: Former    Packs/day: 2.00    Years: 45.00    Additional pack years: 0.00    Total pack years: 90.00    Types: Cigarettes    Quit date: 10/28/2020    Years since quitting: 2.1   Smokeless tobacco: Never   Tobacco comments:    Gabrielle Pineda been quit for about a year now.  Substance Use Topics   Alcohol use: No    Comment: recovering alcoholic Since 1994    Allergies  Allergen Reactions   Sulfasalazine Other (See Comments)   Plaquenil [Hydroxychloroquine] Rash   Medications: reviewed with the patient and are as noted.  Immunization History  Administered Date(s) Administered   Fluad Quad(high Dose 65+) 10/01/2021, 05/14/2022   Influenza Split 09/07/2012   Influenza,inj,Quad PF,6+ Mos 11/18/2015, 07/23/2016, 07/02/2017, 04/14/2018, 04/17/2019, 04/19/2020   Influenza-Unspecified 09/24/2014   PFIZER(Purple Top)SARS-COV-2 Vaccination 11/21/2019, 12/12/2019   PNEUMOCOCCAL CONJUGATE-20 11/17/2021   Pneumococcal Polysaccharide-23 02/12/2011, 09/24/2014, 04/19/2020   Tdap 02/12/2011, 11/15/2015        Objective:   Physical Exam BP 118/80 (BP  Location: Left Arm, Cuff Size: Normal)   Pulse 74   Temp 97.7 F (36.5 C)   Ht  (1.753 m)   Wt 174 lb (78.9 kg)   SpO2 97%   BMI 25.70 kg/m  GENERAL: Well-developed, overweight woman, no acute distress.  Uses accessories of respiration appears to be on chronic basis.  Fully ambulatory.  No conversational dyspnea. HEAD: Normocephalic, atraumatic.  EYES: Pupils equal, round, reactive to light.  No scleral icterus.  MOUTH: Nose/mouth/throat not examined due to masking requirements for COVID 19. NECK: Supple. No thyromegaly. Trachea midline. No JVD.  No adenopathy. PULMONARY: Good air entry bilaterally.  Gatter rhonchi and faint wheezes noted throughout. CARDIOVASCULAR: S1 and S2. Regular rate and rhythm.  ABDOMEN: Benign. MUSCULOSKELETAL: Stigmata of rheumatoid arthritis on both hands, no clubbing, no edema.  NEUROLOGIC: No overt focal deficit.  No gait disturbance.  Speech is fluent. SKIN: Intact,warm,dry. PSYCH: Mood anxious, behavior normal.     Lab Results  Component Value Date   NITRICOXIDE 19 05/28/2022      Assessment & Plan:     ICD-10-CM   1. COPD exacerbation (HCC)  J44.1    Improving but still symptomatic No need for antibiotics Medrol taper    2. COPD, severe (HCC)  J44.9    Continue Breztri 2 puffs twice a day Continue as needed albuterol    3. SOB (shortness of breath)  R06.02 Nitric oxide   Due to poorly compensated COPD    4. Nocturnal hypoxemia due to obstructive chronic bronchitis  J44.89    G47.36    Patient compliant with oxygen therapy Patient notes benefit of  therapy Continue oxygen at 2 L/min nocturnally     Meds ordered this encounter  Medications   methylPREDNISolone (MEDROL DOSEPAK) 4 MG TBPK tablet    Sig: Directed in the package.    Dispense:  21 tablet    Refill:  0   Overall the patient is improved.  Do not see need for further antibiotics currently.  She still has significant bronchospasm and will rechallenge with steroids.   Will see the patient in follow-up in 4 to 6 weeks time she is to contact us prior to that time should any new difficulties arise.  Gailen Shelter, MD Advanced Bronchoscopy PCCM [facility]    *This note was dictated using voice recognition software/Dragon.  Despite best efforts to proofread, errors can occur which can change the meaning. Any transcriptional errors that result from this process are unintentional and may not be fully corrected at the time of dictation.

## 2022-05-28 NOTE — Patient Instructions (Signed)
We have sent the prescription to your pharmacy that this will be a medication that you will take for a total of 6 days.  Please take as directed in the package.  We will see you in follow-up in 4 to 6 weeks time call sooner should any new problems arise.

## 2022-05-28 NOTE — Telephone Encounter (Signed)
Dr. Gonzalez, please advise. Thanks 

## 2022-05-29 NOTE — Telephone Encounter (Signed)
She is not contagious.

## 2022-06-06 DIAGNOSIS — J449 Chronic obstructive pulmonary disease, unspecified: Secondary | ICD-10-CM | POA: Diagnosis not present

## 2022-06-08 ENCOUNTER — Ambulatory Visit (INDEPENDENT_AMBULATORY_CARE_PROVIDER_SITE_OTHER): Payer: Medicare Other | Admitting: Physician Assistant

## 2022-06-08 ENCOUNTER — Inpatient Hospital Stay: Payer: Medicare Other | Attending: Oncology

## 2022-06-08 ENCOUNTER — Encounter: Payer: Self-pay | Admitting: Physician Assistant

## 2022-06-08 VITALS — BP 129/62 | HR 80 | Temp 97.7°F | Wt 174.0 lb

## 2022-06-08 DIAGNOSIS — J440 Chronic obstructive pulmonary disease with acute lower respiratory infection: Secondary | ICD-10-CM | POA: Diagnosis not present

## 2022-06-08 DIAGNOSIS — Z87891 Personal history of nicotine dependence: Secondary | ICD-10-CM | POA: Insufficient documentation

## 2022-06-08 DIAGNOSIS — J209 Acute bronchitis, unspecified: Secondary | ICD-10-CM

## 2022-06-08 DIAGNOSIS — R051 Acute cough: Secondary | ICD-10-CM | POA: Diagnosis not present

## 2022-06-08 DIAGNOSIS — M069 Rheumatoid arthritis, unspecified: Secondary | ICD-10-CM | POA: Insufficient documentation

## 2022-06-08 DIAGNOSIS — I10 Essential (primary) hypertension: Secondary | ICD-10-CM | POA: Insufficient documentation

## 2022-06-08 DIAGNOSIS — D539 Nutritional anemia, unspecified: Secondary | ICD-10-CM | POA: Insufficient documentation

## 2022-06-08 DIAGNOSIS — Z800 Family history of malignant neoplasm of digestive organs: Secondary | ICD-10-CM | POA: Diagnosis not present

## 2022-06-08 DIAGNOSIS — K7030 Alcoholic cirrhosis of liver without ascites: Secondary | ICD-10-CM | POA: Insufficient documentation

## 2022-06-08 LAB — COMPREHENSIVE METABOLIC PANEL
ALT: 28 U/L (ref 0–44)
AST: 37 U/L (ref 15–41)
Albumin: 3.5 g/dL (ref 3.5–5.0)
Alkaline Phosphatase: 113 U/L (ref 38–126)
Anion gap: 6 (ref 5–15)
BUN: 9 mg/dL (ref 8–23)
CO2: 21 mmol/L — ABNORMAL LOW (ref 22–32)
Calcium: 8.1 mg/dL — ABNORMAL LOW (ref 8.9–10.3)
Chloride: 108 mmol/L (ref 98–111)
Creatinine, Ser: 1.03 mg/dL — ABNORMAL HIGH (ref 0.44–1.00)
GFR, Estimated: >60 mL/min (ref >60)
Glucose, Bld: 89 mg/dL (ref 70–99)
Potassium: 3.8 mmol/L (ref 3.5–5.1)
Sodium: 135 mmol/L (ref 135–145)
Total Bilirubin: 0.4 mg/dL (ref 0.3–1.2)
Total Protein: 6.9 g/dL (ref 6.5–8.1)

## 2022-06-08 LAB — IRON AND TIBC
Iron: 23 ug/dL — ABNORMAL LOW (ref 28–170)
Saturation Ratios: 8 % — ABNORMAL LOW (ref 10.4–31.8)
TIBC: 276 ug/dL (ref 250–450)
UIBC: 253 ug/dL

## 2022-06-08 LAB — CBC WITH DIFFERENTIAL/PLATELET
Abs Immature Granulocytes: 0.12 10*3/uL — ABNORMAL HIGH (ref 0.00–0.07)
Basophils Absolute: 0.1 10*3/uL (ref 0.0–0.1)
Basophils Relative: 1 %
Eosinophils Absolute: 0.2 10*3/uL (ref 0.0–0.5)
Eosinophils Relative: 2 %
HCT: 31.7 % — ABNORMAL LOW (ref 36.0–46.0)
Hemoglobin: 9.7 g/dL — ABNORMAL LOW (ref 12.0–15.0)
Immature Granulocytes: 1 %
Lymphocytes Relative: 16 %
Lymphs Abs: 1.9 10*3/uL (ref 0.7–4.0)
MCH: 31.8 pg (ref 26.0–34.0)
MCHC: 30.6 g/dL (ref 30.0–36.0)
MCV: 103.9 fL — ABNORMAL HIGH (ref 80.0–100.0)
Monocytes Absolute: 1.3 10*3/uL — ABNORMAL HIGH (ref 0.1–1.0)
Monocytes Relative: 11 %
Neutro Abs: 7.8 10*3/uL — ABNORMAL HIGH (ref 1.7–7.7)
Neutrophils Relative %: 69 %
Platelets: 195 10*3/uL (ref 150–400)
RBC: 3.05 MIL/uL — ABNORMAL LOW (ref 3.87–5.11)
RDW: 12.5 % (ref 11.5–15.5)
WBC: 11.3 10*3/uL — ABNORMAL HIGH (ref 4.0–10.5)
nRBC: 0 % (ref 0.0–0.2)

## 2022-06-08 LAB — RETIC PANEL
Immature Retic Fract: 12.6 % (ref 2.3–15.9)
RBC.: 3.08 MIL/uL — ABNORMAL LOW (ref 3.87–5.11)
Retic Count, Absolute: 77.9 10*3/uL (ref 19.0–186.0)
Retic Ct Pct: 2.5 % (ref 0.4–3.1)
Reticulocyte Hemoglobin: 31.6 pg (ref >27.9)

## 2022-06-08 LAB — TECHNOLOGIST SMEAR REVIEW: Plt Morphology: ADEQUATE

## 2022-06-08 LAB — LACTATE DEHYDROGENASE: LDH: 200 U/L — ABNORMAL HIGH (ref 98–192)

## 2022-06-08 LAB — POCT INFLUENZA A/B
Influenza A, POC: NEGATIVE
Influenza B, POC: NEGATIVE

## 2022-06-08 LAB — FERRITIN: Ferritin: 129 ng/mL (ref 11–307)

## 2022-06-08 NOTE — Progress Notes (Signed)
I,Sha'taria Tyson,acting as a Education administrator for [employer], PA-C.,have documented all relevant documentation on the behalf of Mikey Kirschner, PA-C,as directed by  Mikey Kirschner, PA-C while in the presence of Mikey Kirschner, PA-C.   Established patient visit   Patient: Gabrielle Pineda   DOB: 03-Jun-1956   66 y.o. Female  MRN: [id number] Visit Date: 06/08/2022  Today's healthcare provider: Mikey Kirschner, PA-C   Cc. Cough, sore throat x 2-3 days   Subjective    HPI   Pt reports for the last two days cough, sore throat, hoarseness, rhinorrhea. Reports cough is productive of green/brown mucous. Reports at home covid test was negative. Denies fevers, chills, body aches.   Medications: Outpatient Medications Prior to Visit  Medication Sig   acyclovir ointment (ZOVIRAX) 5 % SMARTSIG:Topical Every 3 Hours   albuterol (PROVENTIL) (2.5 MG/3ML) 0.083% nebulizer solution Take 3 mLs (2.5 mg total) by nebulization every 4 (four) hours as needed for wheezing or shortness of breath.   albuterol (VENTOLIN HFA) 108 (90 Base) MCG/ACT inhaler INHALE TWO PUFFS BY MOUTH INTO LUNGS every SIX hours AS NEEDED FOR WHEEZING AND/OR SHORTNESS OF BREATH   ARIPiprazole (ABILIFY) 5 MG tablet Take 1 tablet (5 mg total) by mouth in the morning.   atorvastatin (LIPITOR) 40 MG tablet Take 1 tablet (40 mg total) by mouth every morning.   Budeson-Glycopyrrol-Formoterol (BREZTRI AEROSPHERE) 160-9-4.8 MCG/ACT AERO Inhale 2 puffs into the lungs in the morning and at bedtime.   busPIRone (BUSPAR) 30 MG tablet Take by mouth. Take 30 mg by mouth 3 (three) times a day   carvedilol (COREG) 12.5 MG tablet TAKE ONE TABLET BY MOUTH EVERY MORNING and TAKE ONE TABLET BY MOUTH EVERYDAY AT BEDTIME   ezetimibe (ZETIA) 10 MG tablet Take 1 tablet (10 mg total) by mouth every morning.   gabapentin (NEURONTIN) 400 MG capsule Take 1 capsule (400 mg total) by mouth 2 (two) times daily.   leflunomide (ARAVA) 20 MG tablet Take 20 mg by  mouth at bedtime.   LINZESS 145 MCG CAPS capsule Take 145 mcg by mouth daily before breakfast.   magic mouthwash (nystatin, hydrocortisone, diphenhydrAMINE) suspension Swish and spit 5 mLs 3 (three) times daily.   magic mouthwash (nystatin, hydrocortisone, diphenhydrAMINE) suspension Swish and spit 5 mLs 3 (three) times daily as needed for mouth pain.   meloxicam (MOBIC) 15 MG tablet Take 15 mg by mouth at bedtime.   methylPREDNISolone (MEDROL DOSEPAK) 4 MG TBPK tablet Directed in the package.   mirtazapine (REMERON) 15 MG tablet Take 1 tablet (15 mg total) by mouth at bedtime. Dose change   montelukast (SINGULAIR) 10 MG tablet Take 1 tablet (10 mg total) by mouth at bedtime.   Multiple Vitamin (MULTIVITAMIN WITH MINERALS) TABS tablet Take 1 tablet by mouth daily.   omeprazole (PRILOSEC) 40 MG capsule Take 1 capsule (40 mg total) by mouth 2 (two) times daily.   [START ON 06/25/2022] oxyCODONE-acetaminophen (PERCOCET) 5-325 MG tablet Take 1 tablet by mouth every 8 (eight) hours as needed for severe pain. Must last 30 days.   potassium chloride SA (KLOR-CON M) 20 MEQ tablet Take 1 tablet (20 mEq total) by mouth daily with breakfast.   topiramate (TOPAMAX) 25 MG tablet Take 1 tablet (25 mg total) by mouth 2 (two) times daily.   valACYclovir (VALTREX) 1000 MG tablet TAKE TWO TABLETS BY MOUTH twice daily FOR 5 DAYS AS NEEDED FOR FEVER blisters   venlafaxine XR (EFFEXOR-XR) 75 MG 24 hr capsule Take 3  capsules (225 mg total) by mouth daily with breakfast. TAKE 3 CAPSULES BY MOUTH ONCE DAILY WITH BREAKFAST   XIIDRA 5 % SOLN Place 1 drop into both eyes daily.   No facility-administered medications prior to visit.    Review of Systems  Constitutional:  Negative for fatigue and fever.  Respiratory:  Negative for cough and shortness of breath.   Cardiovascular:  Negative for chest pain and leg swelling.  Gastrointestinal:  Negative for abdominal pain.  Neurological:  Negative for dizziness and headaches.       Objective    Blood pressure 129/62, pulse 80, temperature 97.7 F (36.5 C), temperature source Oral, weight 174 lb (78.9 kg), SpO2 97 %.   Physical Exam Constitutional:      General: She is awake.     Appearance: She is well-developed.  HENT:     Head: Normocephalic.  Eyes:     Conjunctiva/sclera: Conjunctivae normal.  Cardiovascular:     Rate and Rhythm: Normal rate and regular rhythm.     Heart sounds: Normal heart sounds.  Pulmonary:     Effort: Pulmonary effort is normal.     Breath sounds: Wheezing present.  Skin:    General: Skin is warm.  Neurological:     Mental Status: She is alert and oriented to person, place, and time.  Psychiatric:        Attention and Perception: Attention normal.        Mood and Affect: Mood normal.        Speech: Speech normal.        Behavior: Behavior is cooperative.     Results for orders placed or performed in visit on 06/08/22  Retic Panel  Result Value Ref Range   Retic Ct Pct 2.5 0.4 - 3.1 %   RBC. 3.08 (L) 3.87 - 5.11 MIL/uL   Retic Count, Absolute 77.9 19.0 - 186.0 K/uL   Immature Retic Fract 12.6 2.3 - 15.9 %   Reticulocyte Hemoglobin 31.6 >27.9 pg  Lactate dehydrogenase  Result Value Ref Range   LDH 200 (H) 98 - 192 U/L  Comprehensive metabolic panel  Result Value Ref Range   Sodium 135 135 - 145 mmol/L   Potassium 3.8 3.5 - 5.1 mmol/L   Chloride 108 98 - 111 mmol/L   CO2 21 (L) 22 - 32 mmol/L   Glucose, Bld 89 70 - 99 mg/dL   BUN 9 8 - 23 mg/dL   Creatinine, Ser 1.03 (H) 0.44 - 1.00 mg/dL   Calcium 8.1 (L) 8.9 - 10.3 mg/dL   Total Protein 6.9 6.5 - 8.1 g/dL   Albumin 3.5 3.5 - 5.0 g/dL   AST 37 15 - 41 U/L   ALT 28 0 - 44 U/L   Alkaline Phosphatase 113 38 - 126 U/L   Total Bilirubin 0.4 0.3 - 1.2 mg/dL   GFR, Estimated >60 >60 mL/min   Anion gap 6 5 - 15  CBC with Differential/Platelet  Result Value Ref Range   WBC 11.3 (H) 4.0 - 10.5 K/uL   RBC 3.05 (L) 3.87 - 5.11 MIL/uL   Hemoglobin 9.7 (L) 12.0 -  15.0 g/dL   HCT 31.7 (L) 36.0 - 46.0 %   MCV 103.9 (H) 80.0 - 100.0 fL   MCH 31.8 26.0 - 34.0 pg   MCHC 30.6 30.0 - 36.0 g/dL   RDW 12.5 11.5 - 15.5 %   Platelets 195 150 - 400 K/uL   nRBC 0.0 0.0 - 0.2 %  Neutrophils Relative % PENDING %   Neutro Abs PENDING 1.7 - 7.7 K/uL   Band Neutrophils PENDING %   Lymphocytes Relative PENDING %   Lymphs Abs PENDING 0.7 - 4.0 K/uL   Monocytes Relative PENDING %   Monocytes Absolute PENDING 0.1 - 1.0 K/uL   Eosinophils Relative PENDING %   Eosinophils Absolute PENDING 0.0 - 0.5 K/uL   Basophils Relative PENDING %   Basophils Absolute PENDING 0.0 - 0.1 K/uL   WBC Morphology PENDING    RBC Morphology PENDING    Smear Review PENDING    Other PENDING %   nRBC PENDING 0 /100 WBC   Metamyelocytes Relative PENDING %   Myelocytes PENDING %   Promyelocytes Relative PENDING %   Blasts PENDING %   Immature Granulocytes PENDING %   Abs Immature Granulocytes PENDING 0.00 - 0.07 K/uL    Assessment & Plan     Acute bronchitis w/ COPD Neg flu a/b in office, pt tested neg at home for covid Advised pt to use albuterol neb q 6 hours for wheezing She just finished a course of steroids and was on abx therapy < 30 days ago  Hesitant after two days of symptoms to restart abx Advised fluids, mucinex, add zyrtec, albuterol nebs q 6 hours  F/u if not improved in the next 3-5 days  Advised pt can also f/u with pulmonology   I, Mikey Kirschner, PA-C have reviewed all documentation for this visit. The documentation on  06/08/2022 for the exam, diagnosis, procedures, and orders are all accurate and complete. Mikey Kirschner, PA-C [facility] [address] [place], [place], [address] Office: [phone] Fax: [place]

## 2022-06-09 ENCOUNTER — Other Ambulatory Visit: Payer: Self-pay | Admitting: Physician Assistant

## 2022-06-09 ENCOUNTER — Encounter: Payer: Self-pay | Admitting: Physician Assistant

## 2022-06-09 ENCOUNTER — Other Ambulatory Visit: Payer: Self-pay | Admitting: Family Medicine

## 2022-06-09 DIAGNOSIS — J209 Acute bronchitis, unspecified: Secondary | ICD-10-CM

## 2022-06-09 LAB — HAPTOGLOBIN: Haptoglobin: 233 mg/dL (ref 37–355)

## 2022-06-09 MED ORDER — NEBULIZER MISC: 0 refills | Status: AC

## 2022-06-10 ENCOUNTER — Encounter: Payer: Self-pay | Admitting: Physician Assistant

## 2022-06-10 ENCOUNTER — Other Ambulatory Visit: Payer: Self-pay | Admitting: Physician Assistant

## 2022-06-10 DIAGNOSIS — J441 Chronic obstructive pulmonary disease with (acute) exacerbation: Secondary | ICD-10-CM

## 2022-06-10 MED ORDER — AZITHROMYCIN 250 MG PO TABS: ORAL_TABLET | ORAL | 0 refills | Status: AC

## 2022-06-10 MED ORDER — PREDNISONE 20 MG PO TABS: 20.0000 mg | ORAL_TABLET | Freq: Every day | ORAL | 0 refills | Status: AC

## 2022-06-11 ENCOUNTER — Inpatient Hospital Stay: Payer: Medicare Other | Admitting: Oncology

## 2022-06-11 ENCOUNTER — Telehealth: Payer: Self-pay

## 2022-06-11 ENCOUNTER — Encounter: Payer: Self-pay | Admitting: Oncology

## 2022-06-11 VITALS — BP 151/93 | HR 94 | Temp 98.7°F | Resp 20 | Wt 170.3 lb

## 2022-06-11 DIAGNOSIS — M069 Rheumatoid arthritis, unspecified: Secondary | ICD-10-CM | POA: Diagnosis not present

## 2022-06-11 DIAGNOSIS — D539 Nutritional anemia, unspecified: Secondary | ICD-10-CM | POA: Diagnosis not present

## 2022-06-11 DIAGNOSIS — I10 Essential (primary) hypertension: Secondary | ICD-10-CM | POA: Diagnosis not present

## 2022-06-11 DIAGNOSIS — Z800 Family history of malignant neoplasm of digestive organs: Secondary | ICD-10-CM | POA: Diagnosis not present

## 2022-06-11 DIAGNOSIS — Z87891 Personal history of nicotine dependence: Secondary | ICD-10-CM | POA: Diagnosis not present

## 2022-06-11 NOTE — Progress Notes (Signed)
Hematology/Oncology Progress note [phone] [phone]         Patient Care Team: Mikey Kirschner, PA-C as PCP - General (Physician Assistant) Minna Merritts, MD as PCP - Cardiology (Cardiology) Manya Silvas, MD (Inactive) (Gastroenterology) Germaine Pomfret, [facility] (Pharmacist)   REFERRING PROVIDER: Mikey Kirschner, PA-C  CHIEF COMPLAINTS/REASON FOR VISIT:  Anemia  ASSESSMENT & PLAN:  Macrocytic anemia Chronic normocytic anemia Labs reviewed and discussed with patient. Normal iron panel, B12 and folate, normal TSH.  Negative multiple myeloma panel, light chain ratio.  Normal LDH. Anemia could be secondary to chronic inflammation/autoimmune disease.  macrocytosis could be due to Drug side effects Leflunomide, can not rule out underlying bone marrow disease, i.e. MDS. Her hemoglobin is stable, I recommend monitor labs, if progressive cytopenia, will proceed with bone marrow biopsy for further evaluation. She agrees.       Orders Placed This Encounter  Procedures   CBC with Differential/Platelet    Standing Status:   Future    Standing Expiration Date:   06/11/2023   Comprehensive metabolic panel    Standing Status:   Future    Standing Expiration Date:   06/11/2023   Vitamin B12    Standing Status:   Future    Standing Expiration Date:   06/12/2023   Folate    Standing Status:   Future    Standing Expiration Date:   06/12/2023   Follow-up  3 months. All questions were answered. The patient knows to call the clinic with any problems, questions or concerns.  Earlie Server, MD, PhD [facility] Hematology Oncology 06/11/2022     HISTORY OF PRESENTING ILLNESS:  Gabrielle Pineda is a  66 y.o.  female with PMH listed below who was referred to me for anemia Reviewed patient's recent labs that was done.  She was found to have abnormal CBC on 02/20/2022, she has a hemoglobin of 10, MCV 96, normal white count and platelet counts.  Normal  differential.  Iron panel showed TIBC 248, ferritin 173, iron saturation 44.  Folate 15.4, vitamin B12 674. Reviewed patient's previous labs ordered by primary care physician's office, anemia is chronic onset , duration is since July 2022. No aggravating or improving factors. Patient reports feeling fatigued. She denies recent chest pain on exertion, shortness of breath on minimal exertion, pre-syncopal episodes, or palpitations She had not noticed any recent bleeding such as epistaxis, hematuria or hematochezia.  She takes over the counter NSAID [Advil, Mobic].  Patient has autoimmune disease-rheumatoid arthritis, she is taking Arava and sulfasalazine. Her last colonoscopy was 11/01/2017. She denies any pica and eats a variety of diet.  Patient has a history of alcohol abuse and she has been sober for many years.  She had alcoholic cirrhosis diagnosed in 1994.  All recovered per patient.   INTERVAL HISTORY Gabrielle Pineda is a 66 y.o. female who has above history reviewed by me today presents for follow up visit for management of anemia Patient is currently off Sulfazalazine, due to mucositis. She is on Leflunomide for RA.    MEDICAL HISTORY:  Past Medical History:  Diagnosis Date   Alcohol abuse    Anemia    Anxiety    Arthritis    Cervicalgia    Cirrhosis ([place]) 1994   COPD (chronic obstructive pulmonary disease) ([place])    Depression    Difficult intubation    has plates and screws in neck   Dyspnea    GERD (gastroesophageal reflux disease)  Headache    Heart murmur    on heard when pt is lying   Hypertension    Other and unspecified hyperlipidemia    Status post insertion of spinal cord stimulator    Tachycardia    d/t questionable anxiety happens every 5- 10 years, sees  Cardiology    SURGICAL HISTORY: Past Surgical History:  Procedure Laterality Date   ANTERIOR CERVICAL DECOMP/DISCECTOMY FUSION  2012, 2015, 2018   x3   AUGMENTATION MAMMAPLASTY Bilateral  [place] RELEASE  11/11/2011   Procedure: CARPAL TUNNEL RELEASE;  Surgeon: Floyce Stakes, MD;  Location: [facility];  Service: Neurosurgery;  Laterality: Right;  Right Median Nerve Decompression   CARPAL TUNNEL RELEASE  2013   COLONOSCOPY WITH PROPOFOL N/A 11/01/2017   Procedure: COLONOSCOPY WITH PROPOFOL;  Surgeon: Manya Silvas, MD;  Location: [facility] ENDOSCOPY;  Service: Endoscopy;  Laterality: N/A;   ESOPHAGOGASTRODUODENOSCOPY     LUMBAR LAMINECTOMY/DECOMPRESSION MICRODISCECTOMY N/A 08/15/2019   Procedure: Lumbar three to Sacral one Decompressive lumbar laminectomy;  Surgeon: Erline Levine, MD;  Location: [place];  Service: Neurosurgery;  Laterality: N/A;   neck disc surgery     plates and screws in neck x 2   ROTATOR CUFF REPAIR  06/16/2016   right shoulder    ROTATOR CUFF REPAIR     SPINAL CORD STIMULATOR INSERTION N/A 03/17/2021   Procedure: THORACIC SPINAL CORD STIMULATOR (PERCUTANEOUS) & PULSE GENERATOR PLACEMENT;  Surgeon: [name], MD;  Location: [facility] ORS;  Service: Neurosurgery;  Laterality: N/A;   TONSILLECTOMY AND ADENOIDECTOMY  1973    TOTAL HIP ARTHROPLASTY Right 04/15/2021   Procedure: TOTAL HIP ARTHROPLASTY ANTERIOR APPROACH;  Surgeon: Hessie Knows, MD;  Location: [facility] ORS;  Service: Orthopedics;  Laterality: Right;    SOCIAL HISTORY: Social History   Socioeconomic History   Marital status: Married    Spouse name: jerry   Number of children: 0   Years of education: Not on file   Highest education level: Bachelor's degree (e.g., BA, AB, BS)  Occupational History   Not on file  Tobacco Use   Smoking status: Former    Packs/day: 2.00    Years: 40.00    Total pack years: 80.00    Types: Cigarettes    Quit date: 10/30/2020    Years since quitting: 1.6   Smokeless tobacco: Never  Vaping Use   Vaping Use: Former   Devices: vape occasionally  Substance and Sexual Activity   Alcohol use: No    Comment:  recovering alcoholic Since [age]    Drug use: No   Sexual activity: Not Currently  Other Topics Concern   Not on file  Social History Narrative   Married; full time; does not get regular exercise.    Social Determinants of Health   Financial Resource Strain: Low Risk  (11/03/2021)   Overall Financial Resource Strain (CARDIA)    Difficulty of Paying Living Expenses: Not very hard  Food Insecurity: No Food Insecurity (11/03/2021)   Hunger Vital Sign    Worried About Running Out of Food in the Last Year: Never true    Ran Out of Food in the Last Year: Never true  Transportation Needs: No Transportation Needs (11/03/2021)   PRAPARE - Hydrologist (Medical): No    Lack of Transportation (Non-Medical): No  Physical Activity: Inactive (11/03/2021)   Exercise Vital Sign    Days of Exercise per Week: 0  days    Minutes of Exercise per Session: 0 min  Stress: Stress Concern Present (11/03/2021)   [place]    Feeling of Stress : To some extent  Social Connections: Moderately Isolated (11/03/2021)   Social Connection and Isolation Panel [NHANES]    Frequency of Communication with Friends and Family: More than three times a week    Frequency of Social Gatherings with Friends and Family: More than three times a week    Attends Religious Services: Never    Marine scientist or Organizations: No    Attends Archivist Meetings: Never    Marital Status: Married  Human resources officer Violence: Not At Risk (11/03/2021)   Humiliation, Afraid, Rape, and Kick questionnaire    Fear of Current or Ex-Partner: No    Emotionally Abused: No    Physically Abused: No    Sexually Abused: No    FAMILY HISTORY: Family History  Problem Relation Age of Onset   Alcohol abuse Mother    Bipolar disorder Mother    Suicidality Mother    Pancreatic cancer Father    Alcohol abuse Father    Drug abuse Sister     Alcohol abuse Sister    Depression Sister    Anesthesia problems Neg Hx    Breast cancer Neg Hx     ALLERGIES:  is allergic to sulfasalazine and plaquenil [hydroxychloroquine].  MEDICATIONS:  Current Outpatient Medications  Medication Sig Dispense Refill   acyclovir ointment (ZOVIRAX) 5 % SMARTSIG:Topical Every 3 Hours     albuterol (PROVENTIL) (2.5 MG/3ML) 0.083% nebulizer solution Take 3 mLs (2.5 mg total) by nebulization every 4 (four) hours as needed for wheezing or shortness of breath. 225 mL 2   albuterol (VENTOLIN HFA) 108 (90 Base) MCG/ACT inhaler INHALE TWO PUFFS BY MOUTH INTO LUNGS every SIX hours AS NEEDED FOR WHEEZING AND/OR SHORTNESS OF BREATH 8.5 g 2   ARIPiprazole (ABILIFY) 5 MG tablet Take 1 tablet (5 mg total) by mouth in the morning. 90 tablet 1   atorvastatin (LIPITOR) 40 MG tablet Take 1 tablet (40 mg total) by mouth every morning. 90 tablet 3   azithromycin (ZITHROMAX) 250 MG tablet Take 2 tablets on day 1, then 1 tablet daily on days 2 through 5 6 tablet 0   Budeson-Glycopyrrol-Formoterol (BREZTRI AEROSPHERE) 160-9-4.8 MCG/ACT AERO Inhale 2 puffs into the lungs in the morning and at bedtime. 10.7 g 6   busPIRone (BUSPAR) 30 MG tablet Take by mouth. Take 30 mg by mouth 3 (three) times a day     carvedilol (COREG) 12.5 MG tablet TAKE ONE TABLET BY MOUTH EVERY MORNING and TAKE ONE TABLET BY MOUTH EVERYDAY AT BEDTIME 180 tablet 3   ezetimibe (ZETIA) 10 MG tablet Take 1 tablet (10 mg total) by mouth every morning. 90 tablet 3   gabapentin (NEURONTIN) 400 MG capsule Take 1 capsule (400 mg total) by mouth 2 (two) times daily. 60 capsule 5   leflunomide (ARAVA) 20 MG tablet Take 20 mg by mouth at bedtime.     LINZESS 145 MCG CAPS capsule Take 145 mcg by mouth daily before breakfast.     magic mouthwash (nystatin, hydrocortisone, diphenhydrAMINE) suspension Swish and spit 5 mLs 3 (three) times daily as needed for mouth pain. 240 mL 0   meloxicam (MOBIC) 15 MG tablet Take 15 mg  by mouth at bedtime.     mirtazapine (REMERON) 15 MG tablet Take 1 tablet (15 mg total)  by mouth at bedtime. Dose change 90 tablet 0   montelukast (SINGULAIR) 10 MG tablet Take 1 tablet (10 mg total) by mouth at bedtime. 30 tablet 11   Multiple Vitamin (MULTIVITAMIN WITH MINERALS) TABS tablet Take 1 tablet by mouth daily.     Nebulizer MISC Compressor nebulizer to use with nebulized medications as instructed 1 each 0   omeprazole (PRILOSEC) 40 MG capsule Take 1 capsule (40 mg total) by mouth 2 (two) times daily. 180 capsule 1   [START ON 06/25/2022] oxyCODONE-acetaminophen (PERCOCET) 5-325 MG tablet Take 1 tablet by mouth every 8 (eight) hours as needed for severe pain. Must last 30 days. 90 tablet 0   potassium chloride SA (KLOR-CON M) 20 MEQ tablet Take 1 tablet (20 mEq total) by mouth daily with breakfast. 90 tablet 1   predniSONE (DELTASONE) 20 MG tablet Take 1 tablet (20 mg total) by mouth daily with breakfast for 5 days. 5 tablet 0   topiramate (TOPAMAX) 25 MG tablet Take 1 tablet (25 mg total) by mouth 2 (two) times daily. 60 tablet 11   valACYclovir (VALTREX) 1000 MG tablet TAKE TWO TABLETS BY MOUTH twice daily FOR 5 DAYS AS NEEDED FOR FEVER blisters 30 tablet 1   venlafaxine XR (EFFEXOR-XR) 75 MG 24 hr capsule Take 3 capsules (225 mg total) by mouth daily with breakfast. TAKE 3 CAPSULES BY MOUTH ONCE DAILY WITH BREAKFAST 270 capsule 1   XIIDRA 5 % SOLN Place 1 drop into both eyes daily.     No current facility-administered medications for this visit.    Review of Systems  Constitutional:  Positive for fatigue. Negative for appetite change, chills and fever.  HENT:   Negative for hearing loss and voice change.   Eyes:  Negative for eye problems.  Respiratory:  Negative for chest tightness and cough.   Cardiovascular:  Negative for chest pain.  Gastrointestinal:  Negative for abdominal distention, abdominal pain and blood in stool.  Endocrine: Negative for hot flashes.  Genitourinary:   Negative for difficulty urinating and frequency.   Musculoskeletal:  Positive for arthralgias.  Skin:  Negative for itching and rash.  Neurological:  Negative for extremity weakness.  Hematological:  Negative for adenopathy.  Psychiatric/Behavioral:  Negative for confusion.     PHYSICAL EXAMINATION: ECOG PERFORMANCE STATUS: 1 - Symptomatic but completely ambulatory Vitals:   06/11/22 1400  BP: (!) 151/93  Pulse: 94  Resp: 20  Temp: 98.7 F (37.1 C)  SpO2: 99%   Filed Weights   06/11/22 1400  Weight: 170 lb 4.8 oz (77.2 kg)     Physical Exam Constitutional:      General: She is not in acute distress. HENT:     Head: Normocephalic and atraumatic.  Eyes:     General: No scleral icterus. Cardiovascular:     Rate and Rhythm: Normal rate and regular rhythm.     Heart sounds: Normal heart sounds.  Pulmonary:     Effort: Pulmonary effort is normal. No respiratory distress.     Breath sounds: No wheezing.  Abdominal:     General: Bowel sounds are normal. There is no distension.     Palpations: Abdomen is soft.  Musculoskeletal:        General: No deformity. Normal range of motion.     Cervical back: Normal range of motion and neck supple.  Skin:    General: Skin is warm and dry.     Findings: No erythema or rash.  Neurological:  Mental Status: She is alert and oriented to person, place, and time. Mental status is at baseline.     Cranial Nerves: No cranial nerve deficit.     Coordination: Coordination normal.  Psychiatric:        Mood and Affect: Mood normal.      LABORATORY DATA:  I have reviewed the data as listed    Latest Ref Rng & Units 06/08/2022   12:40 PM 03/10/2022   10:11 AM 02/20/2022    8:42 AM  CBC  WBC 4.0 - 10.5 K/uL 11.3  5.7  4.8   Hemoglobin 12.0 - 15.0 g/dL 9.7  9.8  10.0   Hematocrit 36.0 - 46.0 % 31.7  31.1  30.7   Platelets 150 - 400 K/uL 195  223  252       Latest Ref Rng & Units 06/08/2022   12:40 PM 03/10/2022   10:11 AM 11/17/2021     9:59 AM  CMP  Glucose 70 - 99 mg/dL 89  108  96   BUN 8 - 23 mg/dL _0 Creatinine 0.44 - 1.00 mg/dL 1.03  1.02  0.99   Sodium 135 - 145 mmol/L 135  133  142   Potassium 3.5 - 5.1 mmol/L 3.8  3.5  4.7   Chloride 98 - 111 mmol/L 108  109  109   CO2 22 - 32 mmol/L _1 Calcium 8.9 - 10.3 mg/dL 8.1  8.6  9.4   Total Protein 6.5 - 8.1 g/dL 6.9  6.2  6.6   Total Bilirubin 0.3 - 1.2 mg/dL 0.4  0.5  <0.2   Alkaline Phos 38 - 126 U/L 113  103  173   AST 15 - 41 U/L 37  22  21   ALT 0 - 44 U/L _2 Lab Results  Component Value Date   IRON 23 (L) 06/08/2022   TIBC 276 06/08/2022   FERRITIN 129 06/08/2022    RADIOGRAPHIC STUDIES: I have personally reviewed the radiological images as listed and agreed with the findings in the report. No results found.

## 2022-06-11 NOTE — Telephone Encounter (Signed)
Patient advised. Verbalized undertanding

## 2022-06-11 NOTE — Assessment & Plan Note (Addendum)
Chronic normocytic anemia Labs reviewed and discussed with patient. Normal iron panel, B12 and folate, normal TSH.  Negative multiple myeloma panel, light chain ratio.  Normal LDH. Anemia could be secondary to chronic inflammation/autoimmune disease.  macrocytosis could be due to Drug side effects Leflunomide, can not rule out underlying bone marrow disease, i.e. MDS. Her hemoglobin is stable, I recommend monitor labs, if progressive cytopenia, will proceed with bone marrow biopsy for further evaluation. She agrees.

## 2022-06-11 NOTE — Telephone Encounter (Signed)
-----   Message from Mikey Kirschner, PA-C sent at 06/11/2022 12:08 PM EDT ----- Please call pt and let her know that her pulmonologist is booked, if she does not improve by Friday on the meds I sent yesterday, Dr Patsey Berthold is recommending she go to the ED

## 2022-06-11 NOTE — Progress Notes (Signed)
Patient being treated for viral infection with neg COVID and Flu testing.  Currently taking antibiotic and steroid.

## 2022-06-14 ENCOUNTER — Other Ambulatory Visit: Payer: Self-pay | Admitting: Family Medicine

## 2022-06-14 DIAGNOSIS — R339 Retention of urine, unspecified: Secondary | ICD-10-CM

## 2022-06-15 ENCOUNTER — Telehealth: Payer: Self-pay

## 2022-06-15 ENCOUNTER — Encounter: Payer: Self-pay | Admitting: Physician Assistant

## 2022-06-15 ENCOUNTER — Telehealth: Payer: Self-pay | Admitting: Pulmonary Disease

## 2022-06-15 DIAGNOSIS — R339 Retention of urine, unspecified: Secondary | ICD-10-CM

## 2022-06-15 MED ORDER — POTASSIUM CHLORIDE CRYS ER 20 MEQ PO TBCR: 20.0000 meq | EXTENDED_RELEASE_TABLET | Freq: Every day | ORAL | 1 refills | Status: DC

## 2022-06-15 MED ORDER — BREZTRI AEROSPHERE 160-9-4.8 MCG/ACT IN AERO: 2.0000 | INHALATION_SPRAY | Freq: Two times a day (BID) | RESPIRATORY_TRACT | 11 refills | Status: DC

## 2022-06-15 NOTE — Addendum Note (Signed)
Addended by: Daron Offer A on: 06/15/2022 02:52 PM   Modules accepted: Orders

## 2022-06-15 NOTE — Progress Notes (Signed)
Chronic Care Management Pharmacy Assistant   Name: Gabrielle Pineda  MRN: [id number] DOB: [birth year]  Reason for Encounter: Medication Review/Medication Coordination for [facility]   Recent office visits:  06/08/2022 Mikey Kirschner, PA-C (PCP Office Visit) for Acute Bronchitis with COPD- No medication changes noted, Lab order placed,   Recent consult visits:  06/11/2022 Earlie Server, MD (Oncology) for Follow-up- No medication changes noted, Lab orders placed, No follow-up noted  05/28/2022 [name], MD (Pulmonary) for Follow-up- Stopped: Sulfasalazine 1000 mg, Started: Methylprednisolone 4 mg, Lab order placed, Patient to follow-up in 4-6 weeks  Hospital visits:  None in previous 6 months  Medications: Outpatient Encounter Medications as of 06/15/2022  Medication Sig   acyclovir ointment (ZOVIRAX) 5 % SMARTSIG:Topical Every 3 Hours   albuterol (PROVENTIL) (2.5 MG/3ML) 0.083% nebulizer solution Take 3 mLs (2.5 mg total) by nebulization every 4 (four) hours as needed for wheezing or shortness of breath.   albuterol (VENTOLIN HFA) 108 (90 Base) MCG/ACT inhaler INHALE TWO PUFFS BY MOUTH INTO LUNGS every SIX hours AS NEEDED FOR WHEEZING AND/OR SHORTNESS OF BREATH   ARIPiprazole (ABILIFY) 5 MG tablet Take 1 tablet (5 mg total) by mouth in the morning.   atorvastatin (LIPITOR) 40 MG tablet Take 1 tablet (40 mg total) by mouth every morning.   azithromycin (ZITHROMAX) 250 MG tablet Take 2 tablets on day 1, then 1 tablet daily on days 2 through 5   Budeson-Glycopyrrol-Formoterol (BREZTRI AEROSPHERE) 160-9-4.8 MCG/ACT AERO Inhale 2 puffs into the lungs in the morning and at bedtime.   busPIRone (BUSPAR) 30 MG tablet Take by mouth. Take 30 mg by mouth 3 (three) times a day   carvedilol (COREG) 12.5 MG tablet TAKE ONE TABLET BY MOUTH EVERY MORNING and TAKE ONE TABLET BY MOUTH EVERYDAY AT BEDTIME   ezetimibe (ZETIA) 10 MG tablet Take 1 tablet (10 mg total) by mouth every morning.    gabapentin (NEURONTIN) 400 MG capsule Take 1 capsule (400 mg total) by mouth 2 (two) times daily.   leflunomide (ARAVA) 20 MG tablet Take 20 mg by mouth at bedtime.   LINZESS 145 MCG CAPS capsule Take 145 mcg by mouth daily before breakfast.   magic mouthwash (nystatin, hydrocortisone, diphenhydrAMINE) suspension Swish and spit 5 mLs 3 (three) times daily as needed for mouth pain.   meloxicam (MOBIC) 15 MG tablet Take 15 mg by mouth at bedtime.   mirtazapine (REMERON) 15 MG tablet Take 1 tablet (15 mg total) by mouth at bedtime. Dose change   montelukast (SINGULAIR) 10 MG tablet Take 1 tablet (10 mg total) by mouth at bedtime.   Multiple Vitamin (MULTIVITAMIN WITH MINERALS) TABS tablet Take 1 tablet by mouth daily.   Nebulizer MISC Compressor nebulizer to use with nebulized medications as instructed   omeprazole (PRILOSEC) 40 MG capsule Take 1 capsule (40 mg total) by mouth 2 (two) times daily.   [START ON 06/25/2022] oxyCODONE-acetaminophen (PERCOCET) 5-325 MG tablet Take 1 tablet by mouth every 8 (eight) hours as needed for severe pain. Must last 30 days.   potassium chloride SA (KLOR-CON M) 20 MEQ tablet Take 1 tablet (20 mEq total) by mouth daily with breakfast.   predniSONE (DELTASONE) 20 MG tablet Take 1 tablet (20 mg total) by mouth daily with breakfast for 5 days.   topiramate (TOPAMAX) 25 MG tablet Take 1 tablet (25 mg total) by mouth 2 (two) times daily.   valACYclovir (VALTREX) 1000 MG tablet TAKE TWO TABLETS BY MOUTH twice daily FOR 5 DAYS  AS NEEDED FOR FEVER blisters   venlafaxine XR (EFFEXOR-XR) 75 MG 24 hr capsule Take 3 capsules (225 mg total) by mouth daily with breakfast. TAKE 3 CAPSULES BY MOUTH ONCE DAILY WITH BREAKFAST   XIIDRA 5 % SOLN Place 1 drop into both eyes daily.   No facility-administered encounter medications on file as of 06/15/2022.   Care Gaps: Zoster Vaccines BP>140/90  Star Rating Drugs: Atorvastatin 40 mg last filled on 05/25/2022 for a 30-Day supply with  [facility]  BP Readings from Last 3 Encounters:  06/11/22 (!) 151/93  06/08/22 129/62  05/28/22 118/80    Lab Results  Component Value Date   HGBA1C 5.3 06/26/2021    Patient obtains medications through Adherence Packaging  30 Days   Last adherence delivery included:  Meloxicam 15 mg tablet- Take one tablet by mouth once daily (Bedtime) Ezetimibe (Zetia) 10 mg tablet Take one tablet by mouth daily (Breakfast) Atorvastatin 40 mg tablet- Take one tablet by mouth daily (Breakfast) Carvedilol 12.5 mg tablet - Take on tablet by mouth twice daily (Breakfast, Bedtime) Mirtazapine 15 mg tablet-Take one tablet daily (Bedtime) Topiramate 25 mg tablet-Take one tablet by mouth twice daily (Breakfast, Bedtime) Gabapentin 400 mg tablet- Take one tablet by mouth twice daily (Breakfast, Bedtime) Linzess 145 mcg- Take one tablet by mouth daily (Before Breakfast) (Vials) Venlafaxine ER 75 mg capsule- Take three capsules by mouth daily (Breakfast) Omeprazole 40 mg- Take one capsule by mouth two times a day (Breakfast, Bedtime) Potassium 20 mEq - Take one capsule daily (Breakfast) Aripiprazole 5 mg tablet- Take one tablet by mouth daily (Breakfast) Leflunomide 20 mg tablet- Take one tablet by mouth once daily (Bedtime) Montelukast (Singulair) 10 mg tablet 1 tablet daily (Bedtime) Sulfasalazine 500 mg take 1000 mg by mouth twice daily (Breakfast, Bedtime) Albuterol 90 mcg 2 puffs every 6 hours prn -  Patient declined the following medications for October: Breztri Inhaler 160-9-4.8 mcg 2 puffs in twice daily-Per patient her Pulmonary provider gave her samples  Patient is due for next adherence delivery on: 06/25/2022 [facility]. Called patient and reviewed medications and coordinated delivery.  This delivery to include: Meloxicam 15 mg tablet- Take one tablet by mouth once daily (Bedtime) Ezetimibe (Zetia) 10 mg tablet Take one tablet by mouth daily (Breakfast) Atorvastatin 40 mg tablet- Take  one tablet by mouth daily (Breakfast) Carvedilol 12.5 mg tablet - Take on tablet by mouth twice daily (Breakfast, Bedtime) Mirtazapine 15 mg tablet-Take one tablet daily (Bedtime) Topiramate 25 mg tablet-Take one tablet by mouth twice daily (Breakfast, Bedtime) Gabapentin 400 mg tablet- Take one tablet by mouth twice daily (Breakfast, Bedtime) Linzess 145 mcg- Take one tablet by mouth daily (Before Breakfast) (Vials) Venlafaxine ER 75 mg capsule- Take three capsules by mouth daily (Breakfast) Omeprazole 40 mg- Take one capsule by mouth two times a day (Breakfast, Bedtime) Potassium 20 mEq - Take one capsule daily (Breakfast) Aripiprazole 5 mg tablet- Take one tablet by mouth daily (Breakfast) Leflunomide 20 mg tablet- Take one tablet by mouth once daily (Bedtime) Montelukast (Singulair) 10 mg tablet 1 tablet daily (Bedtime) Sulfasalazine 500 mg take 1000 mg by mouth twice daily (Breakfast, Bedtime) Albuterol 90 mcg 2 puffs every 6 hours prn   Patient declined the following medications for the month of November: Breztri Inhaler 160-9-4.8 mcg 2 puffs in twice daily-Per patient her Pulmonary provider gave her samples  Patient needs refills for Potassium Chloride ER (PCP medication that PCP can send refill for) Leflunomide 20 mg (Request already sent  to specialist).  Confirmed delivery date of 06/25/2022 [facility], advised patient that pharmacy will contact them the morning of delivery.  Patient reports that she hasn't been feeling well this last week. She has seen PCP and is starting to feel a little better. Patient stated Pulmonary has been providing her samples. The patient is open to me trying for patient assistance for this medication as her copayment is around 200.00 a month. I informed patient I would start PAP online via [employer] website. I advised her if they approve her I will contact her provider to have them send over a prescription to [employer].  Patient has been approved via [employer] and  enrolled into their program as of 06/15/2022 enrollment end date is 08/17/2023. I contacted her pulmonary's provider office and requested they send a prescription over to [facility] [place] [address]  Phone:  [phone]  Fax:  [phone]   CPP has been notified of the patient's approval.  Patient has follow-up appointment with Junius Argyle, CPP on 07/20/2022 @ 1100.  Lynann Bologna, CPA/CMA Clinical Pharmacist Assistant Phone: [phone]

## 2022-06-15 NOTE — Telephone Encounter (Signed)
Spoke to [place] with PCP. She stated that patient has been approved from patient assistance.  Rx is needed. Rx has been printed and placed in Dr. Domingo Dimes folder for signed. Once signed, Rx will need to be faxed to [phone].

## 2022-06-16 NOTE — Telephone Encounter (Signed)
Rx has been faxed to [employer]. Received successful fax confirmation.  Lm for Tasha to make her aware.  Nothing further needed.

## 2022-06-17 ENCOUNTER — Telehealth: Payer: Self-pay | Admitting: Physician Assistant

## 2022-06-17 ENCOUNTER — Other Ambulatory Visit: Payer: Self-pay | Admitting: Physician Assistant

## 2022-06-17 ENCOUNTER — Encounter: Payer: Medicare Other | Attending: Physician Assistant | Admitting: Dietician

## 2022-06-17 ENCOUNTER — Other Ambulatory Visit: Payer: Self-pay

## 2022-06-17 ENCOUNTER — Encounter: Payer: Self-pay | Admitting: Dietician

## 2022-06-17 VITALS — Ht 69.0 in | Wt 172.0 lb

## 2022-06-17 DIAGNOSIS — R635 Abnormal weight gain: Secondary | ICD-10-CM | POA: Insufficient documentation

## 2022-06-17 DIAGNOSIS — I10 Essential (primary) hypertension: Secondary | ICD-10-CM | POA: Insufficient documentation

## 2022-06-17 DIAGNOSIS — J449 Chronic obstructive pulmonary disease, unspecified: Secondary | ICD-10-CM | POA: Diagnosis not present

## 2022-06-17 DIAGNOSIS — D539 Nutritional anemia, unspecified: Secondary | ICD-10-CM | POA: Diagnosis not present

## 2022-06-17 DIAGNOSIS — E7800 Pure hypercholesterolemia, unspecified: Secondary | ICD-10-CM | POA: Insufficient documentation

## 2022-06-17 DIAGNOSIS — B001 Herpesviral vesicular dermatitis: Secondary | ICD-10-CM

## 2022-06-17 MED ORDER — ALBUTEROL SULFATE (2.5 MG/3ML) 0.083% IN NEBU: 2.5000 mg | INHALATION_SOLUTION | RESPIRATORY_TRACT | 2 refills | Status: DC | PRN

## 2022-06-17 NOTE — Patient Instructions (Addendum)
Continue to take vitamin supplements to help avoid deficiencies when eating less than 3 meals daily.  Try a protein drink or drink mix such as Premier Protein Good Night which can be ordered online; in the evening, or other protein drink earlier in the day. This will help to keep lean muscle weight and help prevent muscle loss. Include 1 snack daily of fruit and cheese or yogurt or peanut butter.  Try a few minutes of walking at sister's house in the afternoons. Keep drinking plenty of water and limit soda to no more than 2 glasses.  Call or message Pam with any questions or concerns, or to schedule another visit if needed.

## 2022-06-17 NOTE — Progress Notes (Signed)
Medical Nutrition Therapy: Visit start time: 0930  end time: 1000  Assessment:  Diagnosis: unintentional weight gain Medical history changes: no changes Psychosocial issues/ stress concerns: none  Medications, supplement changes: reviewed list in medical record   Current weight: 172.0lbs with shoes, jacket Height: 5'9" BMI: 25.4  Progress and evaluation:  Patient reports some weight loss since previous visit. Weight measurement today done with likely heavier clothing She reports more mindfulness with what and when of eating Takes MVI daily, has started vitamin D and C supplements per PCP advice.     Dietary Intake:  Usual eating pattern includes 1-2 meals and 0 snacks per day. Dining out frequency: 1-2 meals per week.  Breakfast: skips or egg ans sausage biscuit Pharmacist, community) Snack: none Lunch: 2pm dinner meal Snack: none Supper: early dinner, not hungry Snack: none Beverages: 6-7 glasses water, 2-3 glasses Cheerwine soda  Physical activity: none currently due to back pain (worsening); COPD. Tries to add extra steps when able, ie at appts.  Intervention:   Nutrition Care Education:  Basic nutrition: reviewed appropriate meal and snack schedule; general nutrition guidelines including importance of adequate nutrition for overall health and healthy metabolism   Weight control: reviewed progress since previous visit; importance of adequate nutrition including protein to preserve strength; discussed options for gradually increasing physical activity and tolerance Hypertension: food sources of potassium, magnesium -- encouraged increase in veg and fruit intake to help with BP control and overall nutrition  Other Intervention Notes: Patient's food and beverage intake is similar to previous visit Updated goals to increase ability to meet daily nutritional needs. Patient opted out of scheduling any additional MNT visits; will schedule later if needed.  Nutritional Diagnosis:  [place]-2.2  Altered nutrition-related laboratory As related to macrocytic anemia.  As evidenced by low RBC, hemoglobin, and hematocrit with elevated cell volumes. [place]-3.4 Unintentional weight gain As related to decreased physical activity.  As evidenced by patient with current BMI of 25.4.   Education Materials given:  Visit summary with goals/ instructions   Learner/ who was taught:  Patient    Level of understanding: Verbalizes/ demonstrates competency   Demonstrated degree of understanding via:   Teach back Learning barriers: None  Willingness to learn/ readiness for change: Some change in progress   Monitoring and Evaluation:  Dietary intake, exercise, nutritional status, and body weight      follow up: prn

## 2022-06-17 NOTE — Telephone Encounter (Signed)
[facility] faxed refill request for the following medications:  acyclovir ointment (ZOVIRAX) 5 %   Please advise.

## 2022-06-20 ENCOUNTER — Other Ambulatory Visit: Payer: Self-pay | Admitting: Family Medicine

## 2022-06-22 ENCOUNTER — Other Ambulatory Visit: Payer: Self-pay | Admitting: Physician Assistant

## 2022-06-22 ENCOUNTER — Encounter: Payer: Self-pay | Admitting: Physician Assistant

## 2022-06-22 MED ORDER — ALBUTEROL SULFATE (2.5 MG/3ML) 0.083% IN NEBU: 2.5000 mg | INHALATION_SOLUTION | RESPIRATORY_TRACT | 2 refills | Status: DC | PRN

## 2022-06-29 ENCOUNTER — Ambulatory Visit
Admission: RE | Admit: 2022-06-29 | Discharge: 2022-06-29 | Disposition: A | Discharge status: Home / Self Care | Payer: Medicare Other | Source: Ambulatory Visit | Attending: Pulmonary Disease | Admitting: Pulmonary Disease

## 2022-06-29 DIAGNOSIS — R918 Other nonspecific abnormal finding of lung field: Secondary | ICD-10-CM | POA: Diagnosis not present

## 2022-06-29 DIAGNOSIS — J449 Chronic obstructive pulmonary disease, unspecified: Secondary | ICD-10-CM | POA: Insufficient documentation

## 2022-06-29 DIAGNOSIS — J439 Emphysema, unspecified: Secondary | ICD-10-CM | POA: Diagnosis not present

## 2022-06-29 DIAGNOSIS — I2510 Atherosclerotic heart disease of native coronary artery without angina pectoris: Secondary | ICD-10-CM | POA: Diagnosis not present

## 2022-06-29 DIAGNOSIS — R911 Solitary pulmonary nodule: Secondary | ICD-10-CM

## 2022-06-29 DIAGNOSIS — Z87891 Personal history of nicotine dependence: Secondary | ICD-10-CM | POA: Insufficient documentation

## 2022-06-29 DIAGNOSIS — I700 Atherosclerosis of aorta: Secondary | ICD-10-CM | POA: Diagnosis not present

## 2022-06-30 ENCOUNTER — Other Ambulatory Visit: Payer: Self-pay

## 2022-06-30 ENCOUNTER — Ambulatory Visit
Payer: Medicare Other | Attending: Student in an Organized Health Care Education/Training Program | Admitting: Student in an Organized Health Care Education/Training Program

## 2022-06-30 ENCOUNTER — Encounter: Payer: Self-pay | Admitting: Student in an Organized Health Care Education/Training Program

## 2022-06-30 VITALS — BP 135/82 | HR 73 | Temp 97.2°F | Ht 69.0 in | Wt 170.0 lb

## 2022-06-30 DIAGNOSIS — M5416 Radiculopathy, lumbar region: Secondary | ICD-10-CM | POA: Insufficient documentation

## 2022-06-30 DIAGNOSIS — M961 Postlaminectomy syndrome, not elsewhere classified: Secondary | ICD-10-CM | POA: Insufficient documentation

## 2022-06-30 DIAGNOSIS — G629 Polyneuropathy, unspecified: Secondary | ICD-10-CM | POA: Diagnosis not present

## 2022-06-30 DIAGNOSIS — M47812 Spondylosis without myelopathy or radiculopathy, cervical region: Secondary | ICD-10-CM | POA: Diagnosis not present

## 2022-06-30 DIAGNOSIS — M5030 Other cervical disc degeneration, unspecified cervical region: Secondary | ICD-10-CM | POA: Diagnosis not present

## 2022-06-30 DIAGNOSIS — G894 Chronic pain syndrome: Secondary | ICD-10-CM | POA: Insufficient documentation

## 2022-06-30 DIAGNOSIS — R918 Other nonspecific abnormal finding of lung field: Secondary | ICD-10-CM

## 2022-06-30 MED ORDER — OXYCODONE-ACETAMINOPHEN 5-325 MG PO TABS: 1.0000 | ORAL_TABLET | Freq: Three times a day (TID) | ORAL | 0 refills | Status: AC | PRN

## 2022-06-30 MED ORDER — OXYCODONE-ACETAMINOPHEN 5-325 MG PO TABS: 1.0000 | ORAL_TABLET | Freq: Three times a day (TID) | ORAL | 0 refills | Status: DC | PRN

## 2022-06-30 MED ORDER — GABAPENTIN 400 MG PO CAPS: 400.0000 mg | ORAL_CAPSULE | Freq: Two times a day (BID) | ORAL | 5 refills | Status: DC

## 2022-06-30 NOTE — Progress Notes (Signed)
PROVIDER NOTE: Information contained herein reflects review and annotations entered in association with encounter. Interpretation of such information and data should be left to medically-trained personnel. Information provided to patient can be located elsewhere in the medical record under "Patient Instructions". Document created using STT-dictation technology, any transcriptional errors that may result from process are unintentional.    Patient: Gabrielle Pineda  Service Category: E/M  Provider: [name], MD  DOB: 11-Jul-1956  DOS: 06/30/2022  Specialty: Interventional Pain Management  MRN: [id number]  Setting: Ambulatory outpatient  PCP: Mikey Kirschner, PA-C  Type: Established Patient    Referring Provider: Mikey Kirschner, PA-C  Location: Office  Delivery: Face-to-face     HPI  [name], a 66 y.o. year old female, is here today because of her Chronic pain syndrome [G89.4]. [name] primary complain today is cervical spine pain, low back pain, hip pain  Last encounter: My last encounter with her was on 04/07/22   Pertinent problems: [name] has Closed compression fracture of L5 lumbar vertebra; Cervical radiculopathy; Neuropathy; Spinal stenosis, lumbar region, with neurogenic claudication; Spondylosis of cervical region without myelopathy or radiculopathy; Cervical fusion syndrome; Chronic pain syndrome; Postlaminectomy syndrome, lumbar region; Chronic radicular lumbar pain; and Status post total hip replacement, right on their pertinent problem list. Pain Assessment: Severity of Chronic pain is reported as a 8 /10. Location: Back Lower/Denies. Onset: More than a month ago. Quality: Aching. Timing: Constant. Modifying factor(s): Meds. Vitals:  height is _0  (1.753 m) and weight is 170 lb (77.1 kg). Her temperature is 97.2 F (36.2 C) (abnormal). Her blood pressure is 135/82 and her pulse is 73. Her oxygen saturation is 100%.   Reason for encounter:   No change  in medical history since last visit.  Patient's pain is at baseline.  Patient continues multimodal pain regimen as prescribed.  States that it provides pain relief and improvement in functional status.   ROS  Constitutional: Denies any fever or chills Gastrointestinal: No reported hemesis, hematochezia, vomiting, or acute GI distress Musculoskeletal:  Low back pain Neurological: No reported episodes of acute onset apraxia, aphasia, dysarthria, agnosia, amnesia, paralysis, loss of coordination, or loss of consciousness  Medication Review  ARIPiprazole, Budeson-Glycopyrrol-Formoterol, Lifitegrast, Nebulizer, acyclovir ointment, albuterol, atorvastatin, busPIRone, carvedilol, ezetimibe, gabapentin, leflunomide, linaclotide, (magic mouthwash (nystatin, hydrocortisone, diphenhydrAMINE) suspension), meloxicam, mirtazapine, montelukast, multivitamin with minerals, omeprazole, oxyCODONE-acetaminophen, potassium chloride SA, topiramate, valACYclovir, and venlafaxine XR  History Review  Allergy: [name] is allergic to sulfasalazine and plaquenil [hydroxychloroquine]. Drug: [name]  reports no history of drug use. Alcohol:  reports no history of alcohol use. Tobacco:  reports that she quit smoking about 20 months ago. Her smoking use included cigarettes. She has a 80.00 pack-year smoking history. She has never used smokeless tobacco. Social: Gabrielle Pineda  reports that she quit smoking about 20 months ago. Her smoking use included cigarettes. She has a 80.00 pack-year smoking history. She has never used smokeless tobacco. She reports that she does not drink alcohol and does not use drugs. Medical:  has a past medical history of Alcohol abuse, Anemia, Anxiety, Arthritis, Cervicalgia, Cirrhosis ([place]) (1994), COPD (chronic obstructive pulmonary disease) ([place]), Depression, Difficult intubation, Dyspnea, GERD (gastroesophageal reflux disease), Headache, Heart murmur, Hypertension, Other and unspecified  hyperlipidemia, Status post insertion of spinal cord stimulator, and Tachycardia. Surgical: [name]  has a past surgical history that includes neck disc surgery; Breast enhancement surgery (1981); Esophagogastroduodenoscopy; Tonsillectomy and adenoidectomy (1973 ); Carpal tunnel release (11/11/2011); Carpal tunnel release (  2013); Rotator cuff repair (06/16/2016); Rotator cuff repair; Colonoscopy with propofol (N/A, 11/01/2017); Augmentation mammaplasty (Bilateral, 1982); Anterior cervical decomp/discectomy fusion (2012, 2015, 2018); Lumbar laminectomy/decompression microdiscectomy (N/A, 08/15/2019); Spinal cord stimulator insertion (N/A, 03/17/2021); and Total hip arthroplasty (Right, 04/15/2021). Family: family history includes Alcohol abuse in her father, mother, and sister; Bipolar disorder in her mother; Depression in her sister; Drug abuse in her sister; Pancreatic cancer in her father; Suicidality in her mother.  Laboratory Chemistry Profile   Renal Lab Results  Component Value Date   BUN 9 06/08/2022   CREATININE 1.03 (H) 06/08/2022   BCR 5 (L) 11/17/2021   GFRAA 72 05/06/2020   GFRNONAA >60 06/08/2022    Hepatic Lab Results  Component Value Date   AST 37 06/08/2022   ALT 28 06/08/2022   ALBUMIN 3.5 06/08/2022   ALKPHOS 113 06/08/2022   LIPASE 63 03/23/2018    Electrolytes Lab Results  Component Value Date   NA 135 06/08/2022   K 3.8 06/08/2022   CL 108 06/08/2022   CALCIUM 8.1 (L) 06/08/2022   PHOS 3.2 05/06/2020    Bone No results found for: "VD25OH", "VD125OH2TOT", "GB1517OH6", "WV3710GY6", "25OHVITD1", "25OHVITD2", "25OHVITD3", "TESTOFREE", "TESTOSTERONE"  Inflammation (CRP: Acute Phase) (ESR: Chronic Phase) No results found for: "CRP", "ESRSEDRATE", "LATICACIDVEN"       Note: Above Lab results reviewed.  Recent Imaging Review  CT CHEST WO CONTRAST CLINICAL DATA:  Follow-up pulmonary nodules.  COPD.  Former smoker.  EXAM: CT CHEST WITHOUT  CONTRAST  TECHNIQUE: Multidetector CT imaging of the chest was performed following the standard protocol without IV contrast.  RADIATION DOSE REDUCTION: This exam was performed according to the departmental dose-optimization program which includes automated exposure control, adjustment of the mA and/or kV according to patient size and/or use of iterative reconstruction technique.  COMPARISON:  12/18/2021 high-resolution chest CT.  FINDINGS: Cardiovascular: Normal heart size. No significant pericardial effusion/thickening. Left anterior descending coronary atherosclerosis. Atherosclerotic nonaneurysmal thoracic aorta. Normal caliber pulmonary arteries.  Mediastinum/Nodes: No significant thyroid nodules. Unremarkable esophagus. No pathologically enlarged axillary, mediastinal or hilar lymph nodes, noting limited sensitivity for the detection of hilar adenopathy on this noncontrast study.  Lungs/Pleura: No pneumothorax. No pleural effusion. Moderate centrilobular emphysema with mild diffuse bronchial wall thickening. No acute consolidative airspace disease or lung masses. Previously described indistinct 0.8 cm left upper lobe pulmonary nodule has resolved. Previously described indistinct 0.8 cm posterior right lower lobe pulmonary nodule has resolved. Tiny solid 0.3 cm anterior left upper lobe pulmonary nodule (series 3/image 39), stable since 12/18/2021 chest CT, not definitely seen on 05/21/2011 chest CT. No new significant pulmonary nodules.  Upper abdomen: No acute abnormality.  Musculoskeletal: No aggressive appearing focal osseous lesions. Bilateral breast prostheses are unchanged. Inferior approach spinal stimulator leads terminate in the posterior midthoracic spinal canal. Partially visualized surgical hardware from [facility]. Mild-to-moderate thoracic spondylosis.  IMPRESSION: 1. Previously described indistinct 0.8 cm left upper lobe and 0.8 cm posterior right lower lobe  pulmonary nodules have resolved, compatible with resolved inflammatory nodules. 2. Tiny solid 0.3 cm anterior left upper lobe pulmonary nodule, stable since 12/18/2021 chest CT, not definitely seen on 05/21/2011 chest CT. Suggest final follow-up noncontrast chest CT in 12-18 months given patient risk factors. 3. Moderate centrilobular emphysema with mild diffuse bronchial wall thickening, compatible with reported COPD. 4. One vessel coronary atherosclerosis. 5. Aortic Atherosclerosis (ICD10-I70.0) and Emphysema (ICD10-J43.9).  Electronically Signed   By: Ilona Sorrel M.D.   On: 06/29/2022 18:57 Note: Reviewed  Physical Exam  General appearance: Well nourished, well developed, and well hydrated. In no apparent acute distress Mental status: Alert, oriented x 3 (person, place, & time)       Respiratory: No evidence of acute respiratory distress Eyes: PERLA Vitals: BP 135/82   Pulse 73   Temp (!) 97.2 F (36.2 C)   Ht _0  (1.753 m)   Wt 170 lb (77.1 kg)   SpO2 100%   BMI 25.10 kg/m  BMI: Estimated body mass index is 25.1 kg/m as calculated from the following:   Height as of this encounter: _1  (1.753 m).   Weight as of this encounter: 170 lb (77.1 kg). Ideal: Ideal body weight: 66.2 kg (145 lb 15.1 oz) Adjusted ideal body weight: 70.6 kg (155 lb 9.1 oz)    Lumbar Spine Area Exam  Skin & Axial Inspection: Well healed scar from previous spine surgery detected Alignment: Symmetrical Functional ROM: Pain restricted ROM affecting both sides  Gait & Posture Assessment  Ambulation: Limited Gait: Antalgic Posture: Difficulty standing up straight, due to pain   Lower Extremity Exam      Side: Right lower extremity   Side: Left lower extremity  Stability: No instability observed           Stability: No instability observed          Skin & Extremity Inspection: Skin color, temperature, and hair growth are WNL. No peripheral edema or cyanosis. No masses, redness,  swelling, asymmetry, or associated skin lesions. No contractures.   Skin & Extremity Inspection: Skin color, temperature, and hair growth are WNL. No peripheral edema or cyanosis. No masses, redness, swelling, asymmetry, or associated skin lesions. No contractures.  Functional ROM: Pain restricted ROM for hip and knee joints Limited SLR (straight leg raise)   Functional ROM: Pain restricted ROM for hip and knee joints Limited SLR (straight leg raise)  Muscle Tone/Strength: Functionally intact. No obvious neuro-muscular anomalies detected.   Muscle Tone/Strength: Functionally intact. No obvious neuro-muscular anomalies detected.  Sensory (Neurological): Dermatomal pain pattern         Sensory (Neurological): Dermatomal pain pattern        DTR: Patellar: deferred today Achilles: deferred today Plantar: deferred today   DTR: Patellar: deferred today Achilles: deferred today Plantar: deferred today  Palpation: No palpable anomalies   Palpation: No palpable anomalies     Assessment   Status Diagnosis  Controlled Controlled Controlled 1. Chronic pain syndrome   2. Postlaminectomy syndrome, lumbar region   3. DDD (degenerative disc disease), cervical   4. Lumbar radiculopathy (R>L)   5. Failed back surgical syndrome   6. Neuropathy   7. Spondylosis of cervical region without myelopathy or radiculopathy         Plan of Care  Problem-specific:  No problem-specific Assessment & Plan notes found for this encounter.  [name] has a current medication list which includes the following long-term medication(s): albuterol, albuterol, aripiprazole, atorvastatin, carvedilol, ezetimibe, linzess, mirtazapine, montelukast, omeprazole, potassium chloride sa, topiramate, venlafaxine xr, and gabapentin.  Pharmacotherapy (Medications Ordered): Meds ordered this encounter  Medications   oxyCODONE-acetaminophen (PERCOCET) 5-325 MG tablet    Sig: Take 1 tablet by mouth every 8 (eight)  hours as needed for severe pain. Must last 30 days.    Dispense:  90 tablet    Refill:  0    Chronic Pain: STOP Act (Not applicable) Fill 1 day early if closed on refill date. Avoid benzodiazepines within 8 hours of opioids  oxyCODONE-acetaminophen (PERCOCET) 5-325 MG tablet    Sig: Take 1 tablet by mouth every 8 (eight) hours as needed for severe pain. Must last 30 days.    Dispense:  90 tablet    Refill:  0    Chronic Pain: STOP Act (Not applicable) Fill 1 day early if closed on refill date. Avoid benzodiazepines within 8 hours of opioids   oxyCODONE-acetaminophen (PERCOCET) 5-325 MG tablet    Sig: Take 1 tablet by mouth every 8 (eight) hours as needed for severe pain. Must last 30 days.    Dispense:  90 tablet    Refill:  0    Chronic Pain: STOP Act (Not applicable) Fill 1 day early if closed on refill date. Avoid benzodiazepines within 8 hours of opioids   gabapentin (NEURONTIN) 400 MG capsule    Sig: Take 1 capsule (400 mg total) by mouth 2 (two) times daily.    Dispense:  60 capsule    Refill:  5  Continue Topamax as prescribed, no refills needed Continue Mobic as needed for acute on chronic pain flare Continue care with psychiatry UDS UTD    Follow-up plan:   Return in about 15 weeks (around 10/13/2022) for Medication Management, in person.    Recent Visits Date Type Provider Dept  04/07/22 Office Visit [name], MD [facility]  Showing recent visits within past 90 days and meeting all other requirements Today's Visits Date Type Provider Dept  06/30/22 Office Visit [name], MD [facility]  Showing today's visits and meeting all other requirements Future Appointments No visits were found meeting these conditions. Showing future appointments within next 90 days and meeting all other requirements  I discussed the assessment and treatment plan with the patient. The patient was provided an opportunity to ask questions and all were answered. The  patient agreed with the plan and demonstrated an understanding of the instructions.  Patient advised to call back or seek an in-person evaluation if the symptoms or condition worsens.  Duration of encounter: 66mnutes.  Note by: BGillis Santa MD Date: 06/30/2022; Time: 8:45 AM

## 2022-06-30 NOTE — Progress Notes (Signed)
Nursing Pain Medication Assessment:  Safety precautions to be maintained throughout the outpatient stay will include: orient to surroundings, keep bed in low position, maintain call bell within reach at all times, provide assistance with transfer out of bed and ambulation.  Medication Inspection Compliance: Pill count conducted under aseptic conditions, in front of the patient. Neither the pills nor the bottle was removed from the patient's sight at any time. Once count was completed pills were immediately returned to the patient in their original bottle.  Medication: Oxycodone/APAP Pill/Patch Count:  70 of 90 pills remain Pill/Patch Appearance: Markings consistent with prescribed medication Bottle Appearance: Standard pharmacy container. Clearly labeled. Filled Date: 58 / 1 / 2023 Last Medication intake:  TodaySafety precautions to be maintained throughout the outpatient stay will include: orient to surroundings, keep bed in low position, maintain call bell within reach at all times, provide assistance with transfer out of bed and ambulation.

## 2022-07-07 ENCOUNTER — Encounter: Payer: Self-pay | Admitting: Pulmonary Disease

## 2022-07-07 ENCOUNTER — Ambulatory Visit: Payer: Medicare Other | Admitting: Pulmonary Disease

## 2022-07-07 VITALS — BP 122/80 | HR 72 | Temp 97.3°F | Ht 69.0 in | Wt 175.8 lb

## 2022-07-07 DIAGNOSIS — R0602 Shortness of breath: Secondary | ICD-10-CM | POA: Diagnosis not present

## 2022-07-07 DIAGNOSIS — Z87891 Personal history of nicotine dependence: Secondary | ICD-10-CM

## 2022-07-07 DIAGNOSIS — J449 Chronic obstructive pulmonary disease, unspecified: Secondary | ICD-10-CM | POA: Diagnosis not present

## 2022-07-07 LAB — NITRIC OXIDE: Nitric Oxide: 14

## 2022-07-07 NOTE — Addendum Note (Signed)
Addended by: Hettie Holstein on: 07/07/2022 09:21 AM   Modules accepted: Orders

## 2022-07-07 NOTE — Patient Instructions (Addendum)
We are enrolling you in the lung cancer screening program which will do a CT of the chest every year and keep up with the small tiny little spots in your lung.  We are going to enroll you in pulmonary rehab this is an exercise program that I think will help you with your breathing.  Continue using oxygen at nighttime.  Continue taking your medications.    We will see you in follow-up in 3 months time call sooner should any new problems arise.

## 2022-07-07 NOTE — Progress Notes (Signed)
Subjective:    Patient ID: Gabrielle Pineda, female    DOB: 04/17/1956, 66 y.o.   MRN: [id number]  HPI This is a 66 year old female, former smoker quit in 2022 (80 PY), initially seen for pulmonary consult December 11, 2021 for shortness of breath and COPD, abnormal CT chest with lung nodularity. Medical history significant for rheumatoid arthritis maintained on Arava, sulfasalazine (previously on methotrexate), chronic neck and back pain.  She presents today for follow-up.  Last seen here on 28 May 2022 by me.  This is a scheduled visit.  At her prior visit she required a Medrol dose pack due to mild exacerbation of her COPD. Since that time she has not had any issues and feels that her breathing is better.  She is using Breztri 2 puffs twice a day which she feels helps.  She does not endorse any symptomatology today.   DATA 07/29/2017 simple spirometry: Performed for disability determination FEV1 2.26 L or 79% predicted FVC 3.05 L or 86% predicted FEV1/FVC 74% 11/01/2020 [facility]): Normal left ventricle, LVEF over 55%, right ventricle normal in size with normal systolic function, mild tricuspid regurgitation, no pulmonary hypertension, no wall motion abnormalities 12/18/2021 CT high-res chest: No evidence of fibrotic interstitial lung disease, moderate emphysema and diffuse bronchial wall thickening, mild tracheobronchomalacia, irregular nodules of the left upper lobe and dependent right lower lobe measuring 8 mm, nonspecific.  Fine centrilobular nodularity concentrated in the lung bases consistent with smoking-related respiratory bronchiolitis, coronary artery disease 02/04/2022 PFTs: FEV1 1.45 L or 49% predicted, FVC 2.24 L or 59% predicted, FEV1/FVC 65%, lung volumes normal with modest air trapping,moderately to severe diffusion defect.  Flow volume loop consistent with airway obstruction, no evidence of fixed or variable upper airway obstruction.  There is significant decline in lung  function when compared to the 2018 simple spirometry treatments 02/13/2022 overnight oximetry: Show desaturations of less than 88% for over 2 hours patient qualified for nocturnal oxygen 06/29/2022 CT chest: Previously noted left upper lobe and posterior right lower lobe pulmonary nodules resolved.  Tiny 3 mm anterior upper lobe pulmonary nodule suggest follow-up CT chest in 312 to 18 months.  Moderate centrilobular emphysema with mild diffuse bronchial wall thickening compatible with COPD   Review of Systems A 10 point review of systems was performed and it is as noted above otherwise negative.  Patient Active Problem List   Diagnosis Date Noted   MDD (major depressive disorder), recurrent, in full remission ([place]) 03/31/2022   Macrocytic anemia 02/20/2022   Overweight (BMI 25.0-29.9) 02/20/2022   Dry mouth 02/20/2022   COPD (chronic obstructive pulmonary disease) ([place]) 02/06/2022   Physical deconditioning 02/06/2022   Lung nodules 02/06/2022   Allergic rhinitis 02/06/2022   Clavi 01/22/2022   GAD (generalized anxiety disorder) 12/26/2021   Alcohol use disorder, moderate, in sustained remission ([place]) 12/26/2021   Need for vaccination against Streptococcus pneumoniae 11/17/2021   Sleep disorder 10/23/2021   Iron deficiency anemia 06/26/2021   Depression, major, single episode, moderate ([place]) 06/26/2021   Status post total hip replacement, right 04/15/2021   Chronic radicular lumbar pain 10/03/2020   Postlaminectomy syndrome, lumbar region 06/03/2020   Moderate episode of recurrent major depressive disorder ([place]) 12/04/2019   Spondylosis of cervical region without myelopathy or radiculopathy 09/21/2019   DDD (degenerative disc disease), cervical 09/21/2019   Cervicalgia 09/21/2019   Cervical fusion syndrome 09/21/2019   Chronic pain syndrome 09/21/2019   Spinal stenosis, lumbar region, with neurogenic claudication 08/15/2019   History of  adenomatous polyp of colon 11/04/2017    Cervical radiculopathy 08/02/2017   Neuropathy 08/02/2017   Essential hypertension 07/28/2017   Closed compression fracture of L5 lumbar vertebra 07/07/2017   Sacral insufficiency fracture with routine healing 07/07/2017   COPD exacerbation ([place]) 04/10/2015   Family history of malignant neoplasm of pancreas 04/03/2015   Hypercholesteremia 04/03/2015   Disorder of iron metabolism 04/03/2015   Carpal tunnel syndrome 03/29/2015   Acid reflux 10/11/2014   Closed fracture of distal phalanx of thumb 08/15/2014   Arthritis, degenerative 01/30/2014   Arthritis or polyarthritis, rheumatoid (H[facility]) 01/30/2014   Paroxysmal supraventricular tachycardia 09/22/2010   B-complex deficiency 09/09/2007   CN (constipation) 06/26/2007   Clinical depression 06/26/2007   Cold sore 06/26/2007   H/O alcohol abuse 06/26/2007   Cannot sleep 06/26/2007   Localized osteoarthrosis, hand 06/26/2007   Menopausal symptom 06/26/2007   Social History   Tobacco Use   Smoking status: Former    Packs/day: 2.00    Years: 40.00    Total pack years: 80.00    Types: Cigarettes    Quit date: 10/30/2020    Years since quitting: 1.6   Smokeless tobacco: Never  Substance Use Topics   Alcohol use: No    Comment: recovering alcoholic Since 2683    Allergies  Allergen Reactions   Sulfasalazine Other (See Comments)   Plaquenil [Hydroxychloroquine] Rash   Current Meds  Medication Sig   acyclovir ointment (ZOVIRAX) 5 % SMARTSIG:Topical Every 3 Hours   albuterol (PROVENTIL) (2.5 MG/3ML) 0.083% nebulizer solution Take 3 mLs (2.5 mg total) by nebulization every 4 (four) hours as needed for wheezing or shortness of breath.   albuterol (VENTOLIN HFA) 108 (90 Base) MCG/ACT inhaler INHALE TWO PUFFS BY MOUTH INTO LUNGS every SIX hours AS NEEDED FOR WHEEZING AND/OR SHORTNESS OF BREATH   ARIPiprazole (ABILIFY) 5 MG tablet Take 1 tablet (5 mg total) by mouth in the morning.   atorvastatin (LIPITOR) 40 MG tablet Take 1 tablet (40  mg total) by mouth every morning.   Budeson-Glycopyrrol-Formoterol (BREZTRI AEROSPHERE) 160-9-4.8 MCG/ACT AERO Inhale 2 puffs into the lungs in the morning and at bedtime.   busPIRone (BUSPAR) 30 MG tablet Take by mouth. Take 30 mg by mouth 3 (three) times a day   carvedilol (COREG) 12.5 MG tablet TAKE ONE TABLET BY MOUTH EVERY MORNING and TAKE ONE TABLET BY MOUTH EVERYDAY AT BEDTIME   ezetimibe (ZETIA) 10 MG tablet Take 1 tablet (10 mg total) by mouth every morning.   gabapentin (NEURONTIN) 400 MG capsule Take 1 capsule (400 mg total) by mouth 2 (two) times daily.   leflunomide (ARAVA) 20 MG tablet Take 20 mg by mouth at bedtime.   LINZESS 145 MCG CAPS capsule Take 145 mcg by mouth daily before breakfast.   meloxicam (MOBIC) 15 MG tablet Take 15 mg by mouth at bedtime.   mirtazapine (REMERON) 15 MG tablet Take 1 tablet (15 mg total) by mouth at bedtime. Dose change   montelukast (SINGULAIR) 10 MG tablet Take 1 tablet (10 mg total) by mouth at bedtime.   Multiple Vitamin (MULTIVITAMIN WITH MINERALS) TABS tablet Take 1 tablet by mouth daily.   Nebulizer MISC Compressor nebulizer to use with nebulized medications as instructed   omeprazole (PRILOSEC) 40 MG capsule Take 1 capsule (40 mg total) by mouth 2 (two) times daily.   [START ON 07/21/2022] oxyCODONE-acetaminophen (PERCOCET) 5-325 MG tablet Take 1 tablet by mouth every 8 (eight) hours as needed for severe pain. Must last 30 days.   [  START ON 08/20/2022] oxyCODONE-acetaminophen (PERCOCET) 5-325 MG tablet Take 1 tablet by mouth every 8 (eight) hours as needed for severe pain. Must last 30 days.   [START ON 09/19/2022] oxyCODONE-acetaminophen (PERCOCET) 5-325 MG tablet Take 1 tablet by mouth every 8 (eight) hours as needed for severe pain. Must last 30 days.   potassium chloride SA (KLOR-CON M) 20 MEQ tablet Take 1 tablet (20 mEq total) by mouth daily with breakfast.   topiramate (TOPAMAX) 25 MG tablet Take 1 tablet (25 mg total) by mouth 2 (two) times  daily.   valACYclovir (VALTREX) 1000 MG tablet TAKE TWO TABLETS BY MOUTH TWICE DAILY AS NEEDED FOR 5 DAYS FOR FEVER blisters   venlafaxine XR (EFFEXOR-XR) 75 MG 24 hr capsule Take 3 capsules (225 mg total) by mouth daily with breakfast. TAKE 3 CAPSULES BY MOUTH ONCE DAILY WITH BREAKFAST   XIIDRA 5 % SOLN Place 1 drop into both eyes daily.   Immunization History  Administered Date(s) Administered   Fluad Quad(high Dose 65+) 10/01/2021, 05/14/2022   Influenza Split 09/07/2012   Influenza,inj,Quad PF,6+ Mos 11/18/2015, 07/23/2016, 07/02/2017, 04/14/2018, 04/17/2019, 04/19/2020   Influenza-Unspecified 09/24/2014   PFIZER(Purple Top)SARS-COV-2 Vaccination 11/21/2019, 12/12/2019   PNEUMOCOCCAL CONJUGATE-20 11/17/2021   Pneumococcal Polysaccharide-23 02/12/2011, 09/24/2014, 04/19/2020   Tdap 02/12/2011, 11/15/2015       Objective:   Physical Exam BP 122/80 (BP Location: Left Arm, Cuff Size: Normal)   Pulse 72   Temp (!) 97.3 F (36.3 C)   Ht '5\' 9"'$  (1.753 m)   Wt 175 lb 12.8 oz (79.7 kg)   SpO2 99%   BMI 25.96 kg/m  GENERAL: Well-developed, overweight woman, no acute distress.  Uses accessories of respiration appears to be on chronic basis.  Fully ambulatory.  No conversational dyspnea. HEAD: Normocephalic, atraumatic.  EYES: Pupils equal, round, reactive to light.  No scleral icterus.  MOUTH: Nose/mouth/throat not examined due to masking requirements for COVID 19. NECK: Supple. No thyromegaly. Trachea midline. No JVD.  No adenopathy. PULMONARY: Good air entry bilaterally.  No adventitious sounds. CARDIOVASCULAR: S1 and S2. Regular rate and rhythm.  ABDOMEN: Benign. MUSCULOSKELETAL: Stigmata of rheumatoid arthritis on both hands, no clubbing, no edema.  NEUROLOGIC: No overt focal deficit.  No gait disturbance.  Speech is fluent. SKIN: Intact,warm,dry. PSYCH: Mood anxious, behavior normal.    Lab Results  Component Value Date   NITRICOXIDE 14 07/07/2022  *No evidence of type II  inflammation      Assessment & Plan:     ICD-10-CM   1. Stage 3 severe COPD by GOLD classification ([place])  J44.9 AMB referral to pulmonary rehabilitation   Continue Breztri 2 puffs twice a day Continue as needed albuterol We will enroll the patient in pulmonary rehab    2. SOB (shortness of breath)  R06.02 Nitric oxide   Ambulatory oximetry today showed no evidence of desaturation Enroll in pulmonary rehab    3. Former cigarette smoker  Z87.891 Ambulatory Referral for [place]   Will have patient enrolled in lung cancer screening Will help monitor nodules as well as for potential cancer     Orders Placed This Encounter  Procedures   Ambulatory Referral for Lung Cancer Scre    Referral Priority:   Routine    Referral Type:   Consultation    Referral Reason:   Specialty Services Required    Number of Visits Requested:   1   AMB referral to pulmonary rehabilitation    Referral Priority:   Routine  Referral Type:   Consultation    Number of Visits Requested:   1   Nitric oxide   Patient appears better compensated today.  She is to continue same regimen.  Notes that Judithann Sauger has been very helpful to her.  We will see her in follow-up in 3 months time she is to contact us prior to that time should any new difficulties arise.  Renold Don, MD Advanced Bronchoscopy PCCM [facility]    *This note was dictated using voice recognition software/Dragon.  Despite best efforts to proofread, errors can occur which can change the meaning. Any transcriptional errors that result from this process are unintentional and may not be fully corrected at the time of dictation.

## 2022-07-13 ENCOUNTER — Other Ambulatory Visit: Payer: Self-pay | Admitting: Psychiatry

## 2022-07-13 DIAGNOSIS — F5101 Primary insomnia: Secondary | ICD-10-CM

## 2022-07-13 DIAGNOSIS — F3342 Major depressive disorder, recurrent, in full remission: Secondary | ICD-10-CM

## 2022-07-13 DIAGNOSIS — F411 Generalized anxiety disorder: Secondary | ICD-10-CM

## 2022-07-15 ENCOUNTER — Telehealth: Payer: Self-pay

## 2022-07-15 NOTE — Progress Notes (Signed)
Chronic Care Management Pharmacy Assistant   Name: Gabrielle Pineda  MRN: [id number] DOB: 19-Feb-1956  Reason for Encounter: Medication Review/Medication Coordination for [facility]   Recent office visits:  None ID  Recent consult visits:  06/17/2022 Willodean Rosenthal, RD (Nutrition) for Nutrition Counseling- No medication changes noted, No orders placed,   07/07/2022 [name], MD (Pulmonary) for Follow-up- Stopped: Magic Mouthwash, Pulmonary Rehab referral placed, Lung Cancer Screening Referral placed,   06/30/2022 [name], MD (Pain Management) for Back Pain- Changed: Oxycodone-Acetaminophen 5-325 1 tablet every 8 hours prn., No orders placed, Patient to follow-up in 15 weeks  Hospital visits:  None in previous 6 months  Medications: Outpatient Encounter Medications as of 07/15/2022  Medication Sig   acyclovir ointment (ZOVIRAX) 5 % SMARTSIG:Topical Every 3 Hours   albuterol (PROVENTIL) (2.5 MG/3ML) 0.083% nebulizer solution Take 3 mLs (2.5 mg total) by nebulization every 4 (four) hours as needed for wheezing or shortness of breath.   albuterol (VENTOLIN HFA) 108 (90 Base) MCG/ACT inhaler INHALE TWO PUFFS BY MOUTH INTO LUNGS every SIX hours AS NEEDED FOR WHEEZING AND/OR SHORTNESS OF BREATH   ARIPiprazole (ABILIFY) 5 MG tablet Take 1 tablet (5 mg total) by mouth in the morning.   atorvastatin (LIPITOR) 40 MG tablet Take 1 tablet (40 mg total) by mouth every morning.   Budeson-Glycopyrrol-Formoterol (BREZTRI AEROSPHERE) 160-9-4.8 MCG/ACT AERO Inhale 2 puffs into the lungs in the morning and at bedtime.   busPIRone (BUSPAR) 30 MG tablet Take by mouth. Take 30 mg by mouth 3 (three) times a day   carvedilol (COREG) 12.5 MG tablet TAKE ONE TABLET BY MOUTH EVERY MORNING and TAKE ONE TABLET BY MOUTH EVERYDAY AT BEDTIME   ezetimibe (ZETIA) 10 MG tablet Take 1 tablet (10 mg total) by mouth every morning.   gabapentin (NEURONTIN) 400 MG capsule Take 1 capsule (400 mg total)  by mouth 2 (two) times daily.   leflunomide (ARAVA) 20 MG tablet Take 20 mg by mouth at bedtime.   LINZESS 145 MCG CAPS capsule Take 145 mcg by mouth daily before breakfast.   meloxicam (MOBIC) 15 MG tablet Take 15 mg by mouth at bedtime.   mirtazapine (REMERON) 15 MG tablet TAKE ONE TABLET BY MOUTH EVERYDAY AT BEDTIME   montelukast (SINGULAIR) 10 MG tablet Take 1 tablet (10 mg total) by mouth at bedtime.   Multiple Vitamin (MULTIVITAMIN WITH MINERALS) TABS tablet Take 1 tablet by mouth daily.   Nebulizer MISC Compressor nebulizer to use with nebulized medications as instructed   omeprazole (PRILOSEC) 40 MG capsule Take 1 capsule (40 mg total) by mouth 2 (two) times daily.   [START ON 07/21/2022] oxyCODONE-acetaminophen (PERCOCET) 5-325 MG tablet Take 1 tablet by mouth every 8 (eight) hours as needed for severe pain. Must last 30 days.   [START ON 08/20/2022] oxyCODONE-acetaminophen (PERCOCET) 5-325 MG tablet Take 1 tablet by mouth every 8 (eight) hours as needed for severe pain. Must last 30 days.   [START ON 09/19/2022] oxyCODONE-acetaminophen (PERCOCET) 5-325 MG tablet Take 1 tablet by mouth every 8 (eight) hours as needed for severe pain. Must last 30 days.   potassium chloride SA (KLOR-CON M) 20 MEQ tablet Take 1 tablet (20 mEq total) by mouth daily with breakfast.   topiramate (TOPAMAX) 25 MG tablet Take 1 tablet (25 mg total) by mouth 2 (two) times daily.   valACYclovir (VALTREX) 1000 MG tablet TAKE TWO TABLETS BY MOUTH TWICE DAILY AS NEEDED FOR 5 DAYS FOR FEVER blisters   venlafaxine  XR (EFFEXOR-XR) 75 MG 24 hr capsule Take 3 capsules (225 mg total) by mouth daily with breakfast. TAKE 3 CAPSULES BY MOUTH ONCE DAILY WITH BREAKFAST   XIIDRA 5 % SOLN Place 1 drop into both eyes daily.   No facility-administered encounter medications on file as of 07/15/2022.   Care Gaps: Zoster Vaccine  Star Rating Drugs: Atorvastatin 40 mg last filled on 06/23/2022 for a 30-Day supply with [facility]    BP Readings from Last 3 Encounters:  07/07/22 122/80  06/30/22 135/82  06/11/22 (!) 151/93    Lab Results  Component Value Date   HGBA1C 5.3 06/26/2021    Patient obtains medications through Adherence Packaging  30 Days   Last adherence delivery included:  Meloxicam 15 mg tablet- Take one tablet by mouth once daily (Bedtime) Ezetimibe (Zetia) 10 mg tablet Take one tablet by mouth daily (Breakfast) Atorvastatin 40 mg tablet- Take one tablet by mouth daily (Breakfast) Carvedilol 12.5 mg tablet - Take on tablet by mouth twice daily (Breakfast, Bedtime) Mirtazapine 15 mg tablet-Take one tablet daily (Bedtime) Topiramate 25 mg tablet-Take one tablet by mouth twice daily (Breakfast, Bedtime) Gabapentin 400 mg tablet- Take one tablet by mouth twice daily (Breakfast, Bedtime) Linzess 145 mcg- Take one tablet by mouth daily (Before Breakfast) (Vials) Venlafaxine ER 75 mg capsule- Take three capsules by mouth daily (Breakfast) Omeprazole 40 mg- Take one capsule by mouth two times a day (Breakfast, Bedtime) Potassium 20 mEq - Take one capsule daily (Breakfast) Aripiprazole 5 mg tablet- Take one tablet by mouth daily (Breakfast) Leflunomide 20 mg tablet- Take one tablet by mouth once daily (Bedtime) Montelukast (Singulair) 10 mg tablet 1 tablet daily (Bedtime) Sulfasalazine 500 mg take 1000 mg by mouth twice daily (Breakfast, Bedtime) Albuterol 90 mcg 2 puffs every 6 hours prn   Patient declined the following medications for the month of November: Breztri Inhaler 160-9-4.8 mcg 2 puffs in twice daily-Per patient her Pulmonary provider gave her samples  Patient is due for next adherence delivery on: 07/27/2022 2nd Route.  Called patient and reviewed medications and coordinated delivery.  This delivery to include: Meloxicam 15 mg tablet- Take one tablet by mouth once daily (Bedtime) Ezetimibe (Zetia) 10 mg tablet Take one tablet by mouth daily (Breakfast) Atorvastatin 40 mg tablet- Take  one tablet by mouth daily (Breakfast) Carvedilol 12.5 mg tablet - Take on tablet by mouth twice daily (Breakfast, Bedtime) Mirtazapine 15 mg tablet-Take one tablet daily (Bedtime) Topiramate 25 mg tablet-Take one tablet by mouth twice daily (Breakfast, Bedtime) Gabapentin 400 mg tablet- Take one tablet by mouth twice daily (Breakfast, Bedtime) Linzess 145 mcg- Take one tablet by mouth daily (Before Breakfast) (Vials) Venlafaxine ER 75 mg capsule- Take three capsules by mouth daily (Breakfast) Omeprazole 40 mg- Take one capsule by mouth two times a day (Breakfast, Bedtime) Potassium 20 mEq - Take one capsule daily (Breakfast) Aripiprazole 5 mg tablet- Take one tablet by mouth daily (Breakfast) Leflunomide 20 mg tablet- Take one tablet by mouth once daily (Bedtime) Montelukast (Singulair) 10 mg tablet 1 tablet daily (Bedtime)  Patient declined the following medications for the month of December: Breztri Inhaler 160-9-4.8 mcg 2 puffs in twice daily- Patient receives this medication via [employer] patient assistance program Sulfasalazine 500 mg take 1000 mg by mouth twice daily-Medication has been discontinued Albuterol 90 mcg 2 puffs every 6 hours prn - Patient has sufficient amount at this time  Patient needs refills for the month of December: No refills needed for this month's delivery  Patient will need her Oxycodone 5-325 mg prior to the normal delivery date. Message sent to [facility] regarding this.  Confirmed delivery date of 07/27/2022 2nd Route, advised patient that pharmacy will contact them the morning of delivery.  Patient has a follow-up with CPP on 07/20/2022 @ 1100 via telephone.  Lynann Bologna, CPA/CMA Clinical Pharmacist Assistant Phone: [phone]

## 2022-07-16 ENCOUNTER — Other Ambulatory Visit: Payer: Self-pay

## 2022-07-16 DIAGNOSIS — J449 Chronic obstructive pulmonary disease, unspecified: Secondary | ICD-10-CM

## 2022-07-16 NOTE — Progress Notes (Signed)
Virtual Visit completed. Patient informed on EP and RD appointment and 6 Minute walk test. Patient also informed of patient health questionnaires on My Chart. Patient Verbalizes understanding. Visit diagnosis can be found in [facility] 07/07/2022.

## 2022-07-17 DIAGNOSIS — M0579 Rheumatoid arthritis with rheumatoid factor of multiple sites without organ or systems involvement: Secondary | ICD-10-CM | POA: Diagnosis not present

## 2022-07-17 DIAGNOSIS — Z79899 Other long term (current) drug therapy: Secondary | ICD-10-CM | POA: Diagnosis not present

## 2022-07-18 IMAGING — DX DG HIP (WITH OR WITHOUT PELVIS) 2-3V*R*
2 series · 2 of 2 positions shown · non-contrast
Comparison: Inferior operative images same day

CLINICAL DATA: Postop right hip replacement

EXAM:
DG HIP (WITH OR WITHOUT PELVIS) 2-3V RIGHT

[hip ap]
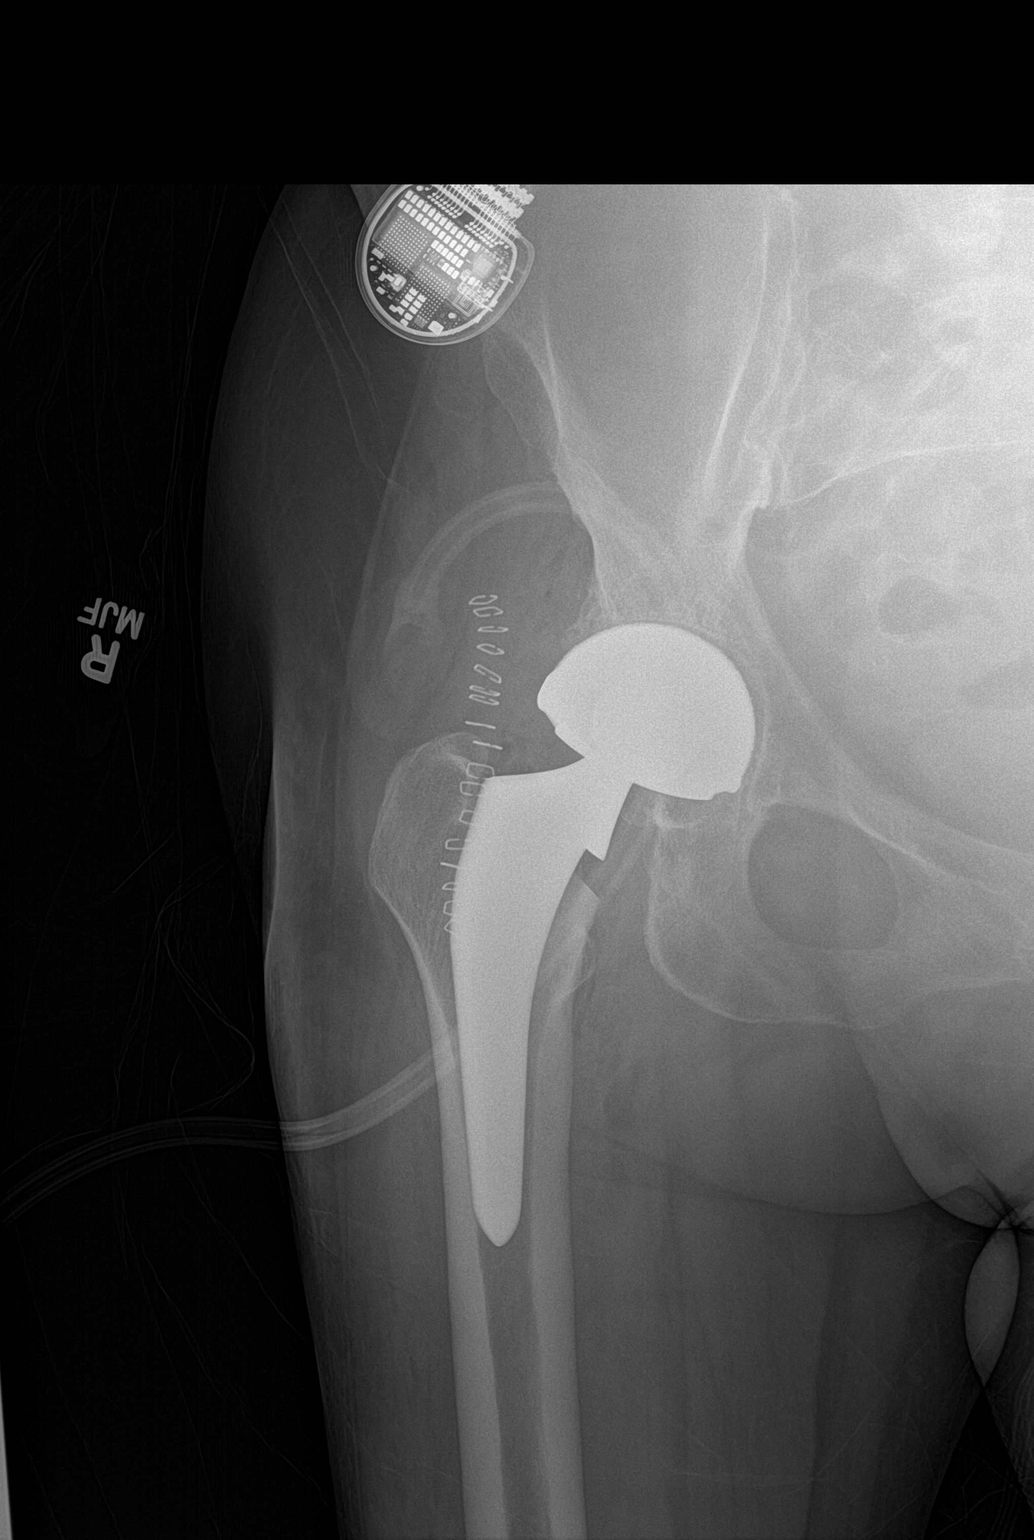

[hip lat]
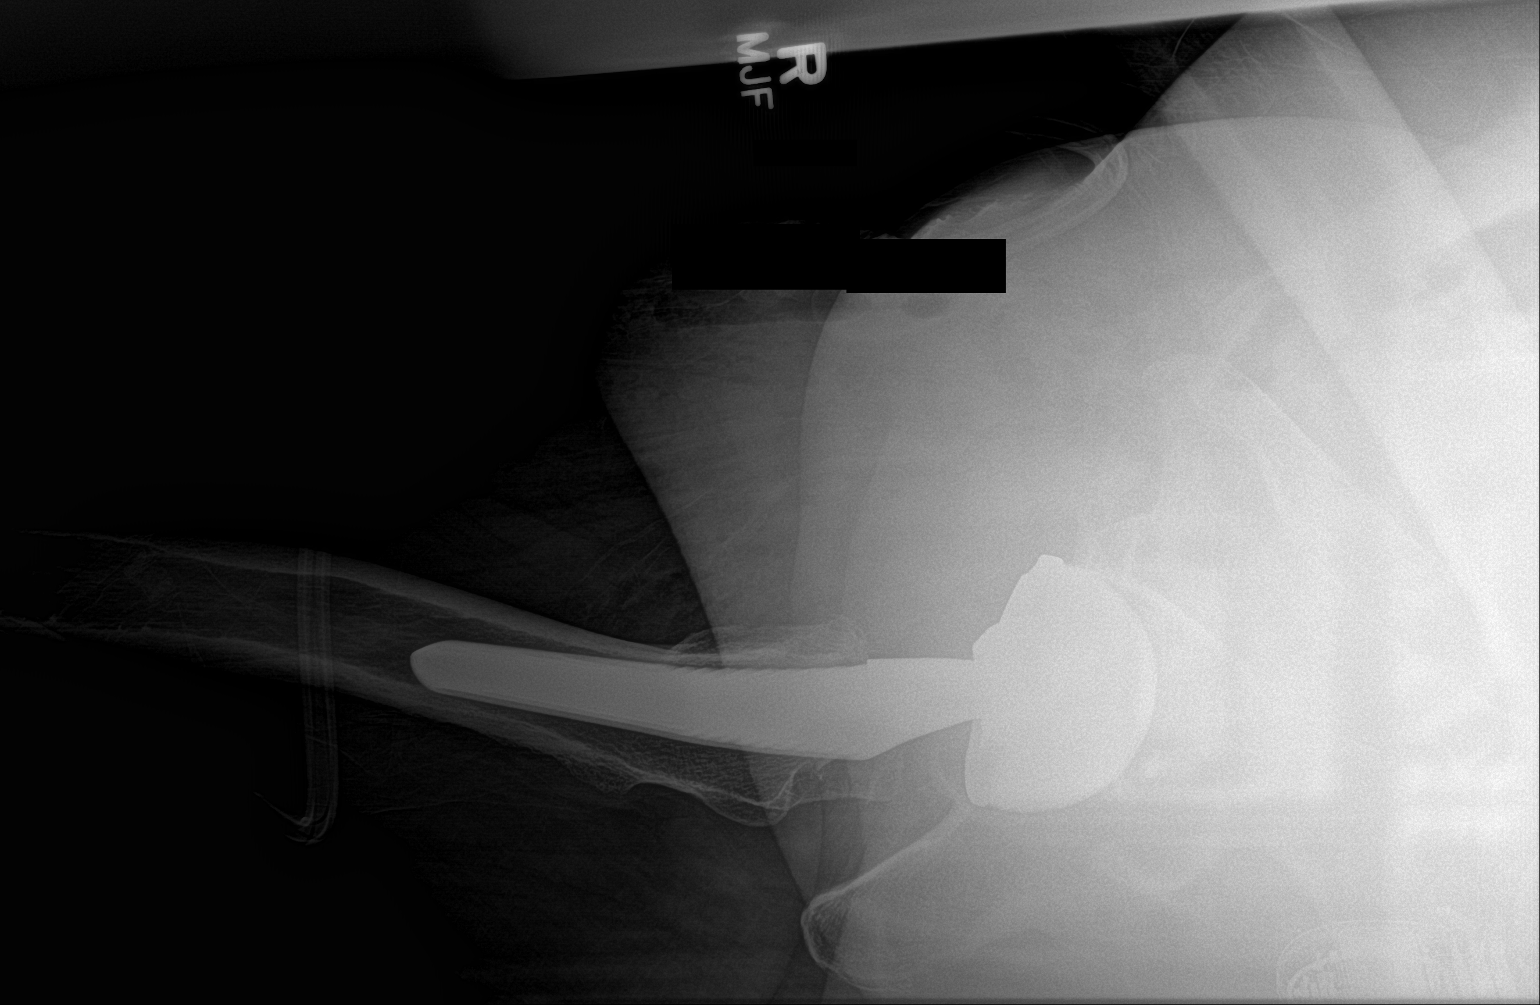

[2 of 2 positions shown; findings below may reference images not displayed]

FINDINGS: Postsurgical changes of right hip arthroplasty. Hardware is in
normal alignment without evidence of loosening or periprosthetic
fracture. Expected soft tissue changes. There is a stimulator
generator overlying the right flank.
IMPRESSION: Right hip arthroplasty without evidence of immediate hardware
complication.

## 2022-07-18 IMAGING — XA DG HIP (WITH PELVIS) OPERATIVE*R*
4 series · 4 of 4 positions shown · non-contrast
Comparison: None.

CLINICAL DATA: Right hip replacement

EXAM:
OPERATIVE RIGHT HIP WITH PELVIS

[Series 1: ortho standard · 1 of 1 slices shown (1 of 4)]
[im 1/1]
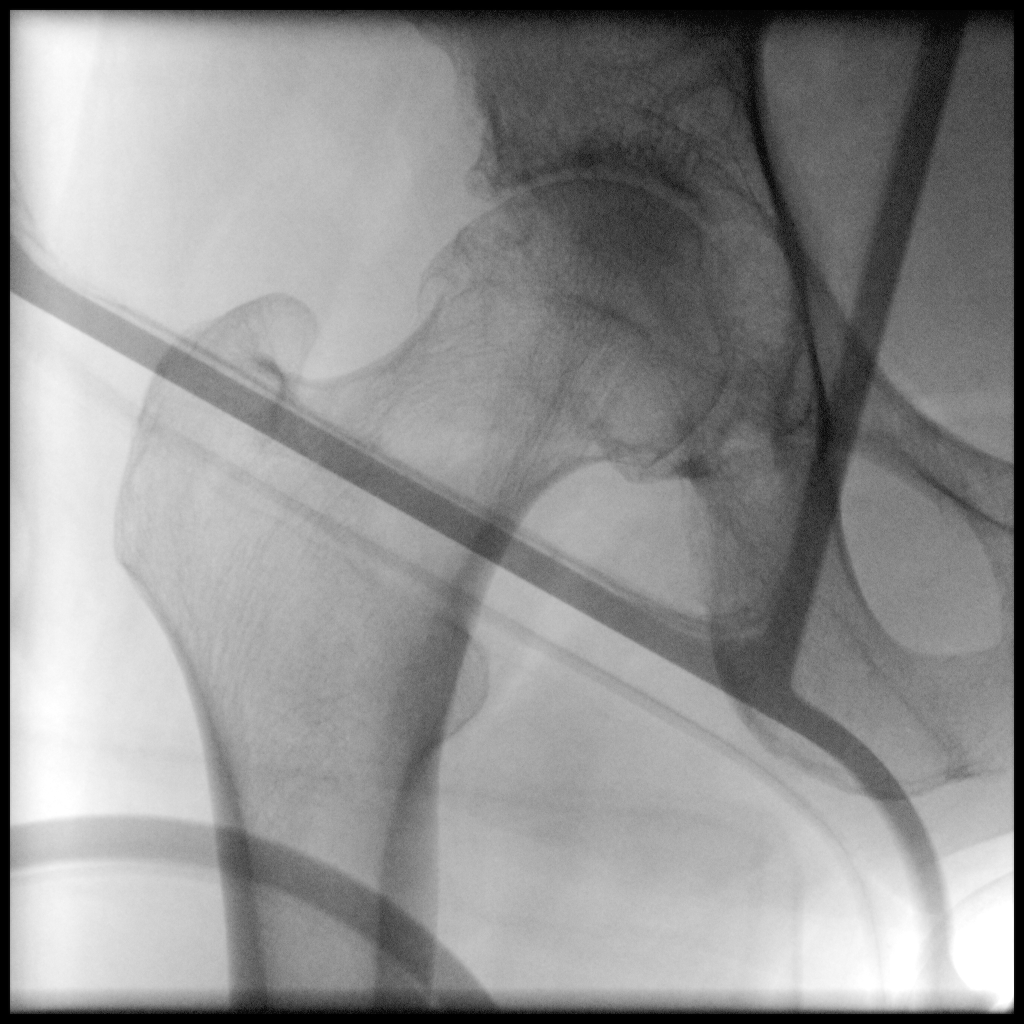

[Series 3: ortho standard · 1 of 1 slices shown (2 of 4)]
[im 1/1]
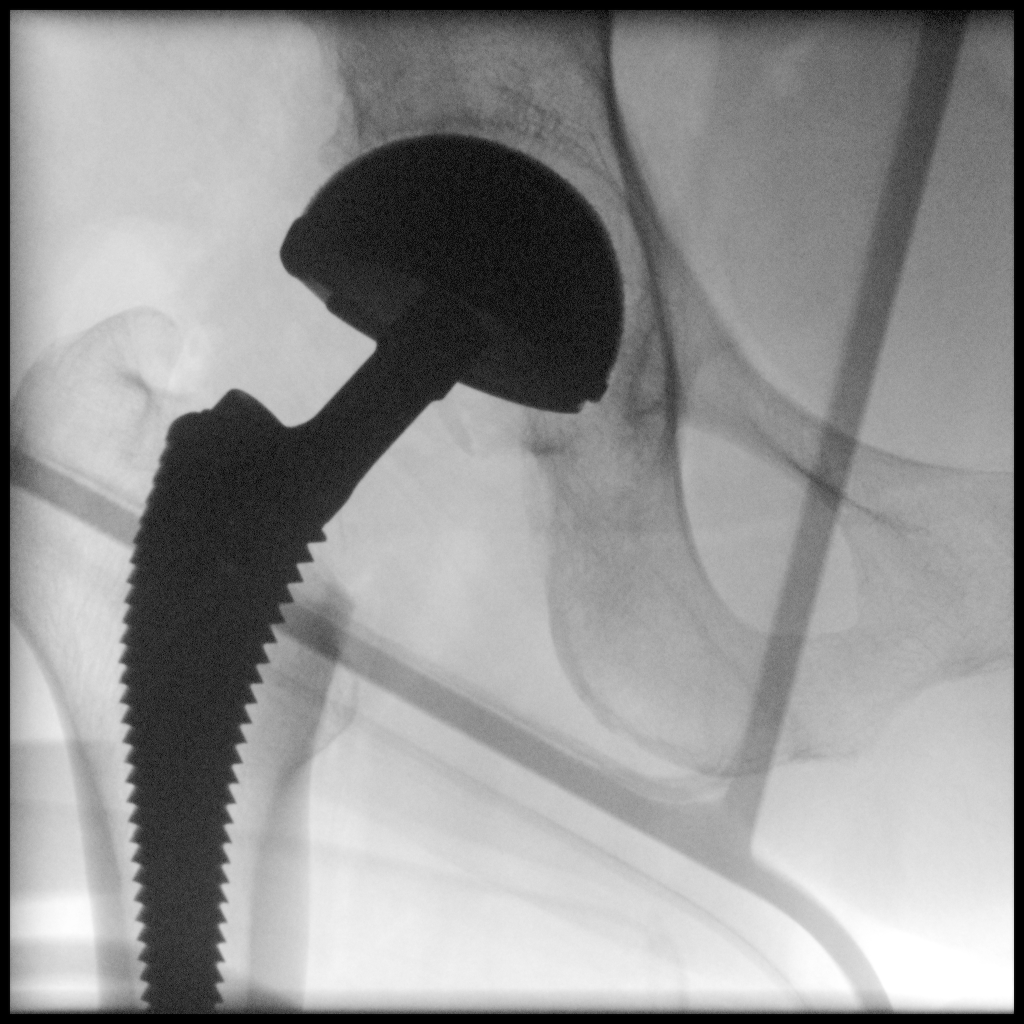

[Series 5: ortho standard · 1 of 1 slices shown (3 of 4)]
[im 1/1]
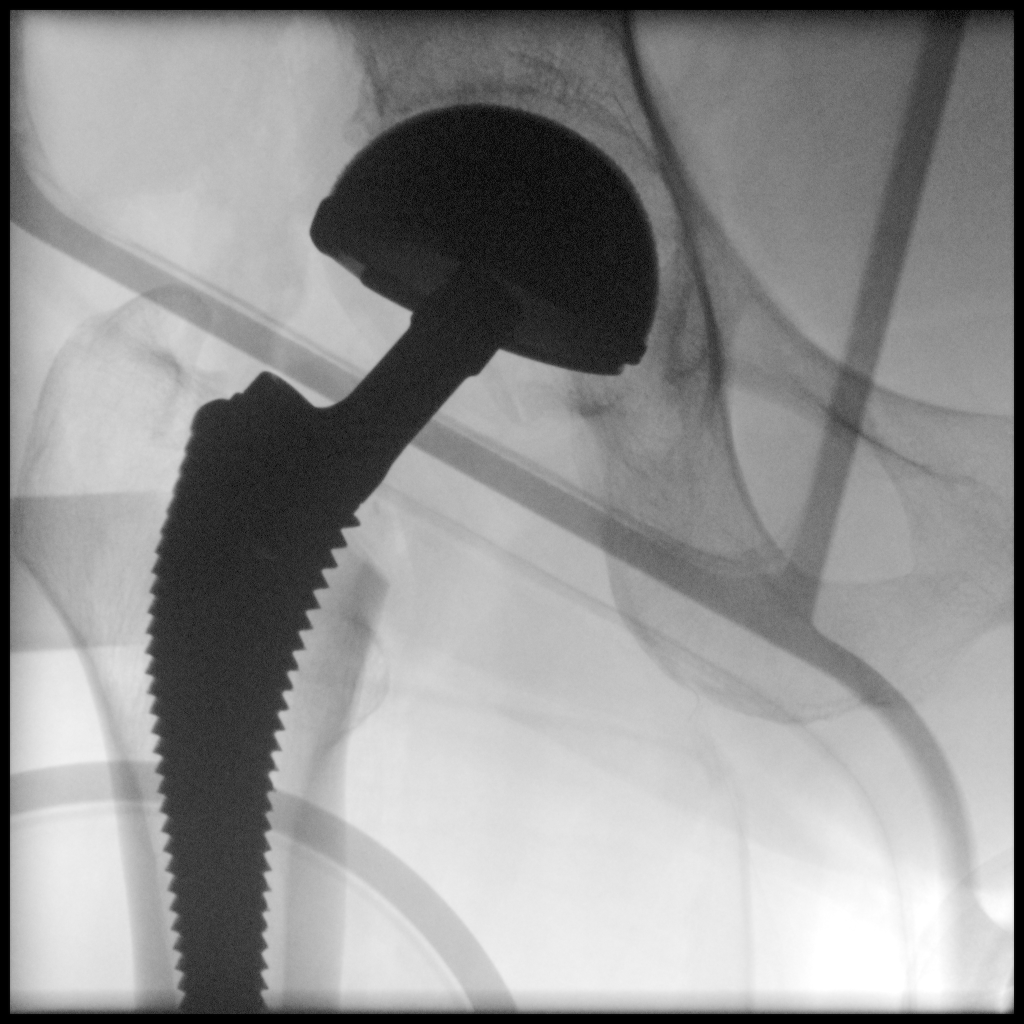

[Series 7: ortho standard · 1 of 1 slices shown (4 of 4)]
[im 1/1]
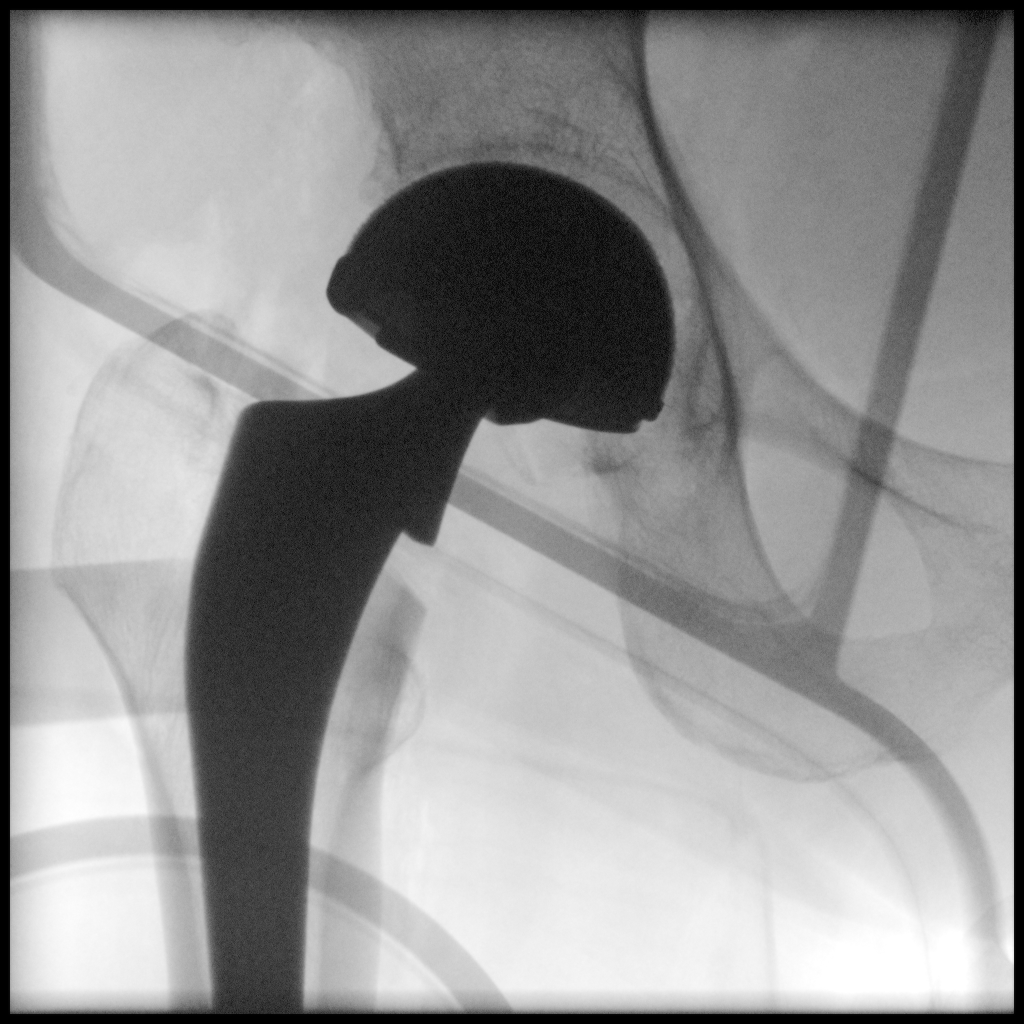

[4 of 4 positions shown; findings below may reference images not displayed]

FLUOROSCOPY TIME:  Radiation Exposure Index (as provided by the
fluoroscopic device): 4.1 mGy

If the device does not provide the exposure index:

Fluoroscopy Time:  12 seconds

Number of Acquired Images:  4
FINDINGS: Right hip prosthesis is noted in satisfactory position. No acute
bony or soft tissue abnormality is noted.
IMPRESSION: Status post right hip replacement.

## 2022-07-20 ENCOUNTER — Ambulatory Visit (INDEPENDENT_AMBULATORY_CARE_PROVIDER_SITE_OTHER): Payer: Medicare Other

## 2022-07-20 DIAGNOSIS — I10 Essential (primary) hypertension: Secondary | ICD-10-CM

## 2022-07-20 DIAGNOSIS — J449 Chronic obstructive pulmonary disease, unspecified: Secondary | ICD-10-CM

## 2022-07-20 NOTE — Progress Notes (Signed)
Chronic Care Management Pharmacy Note  07/20/2022 Name:  Gabrielle Pineda MRN:  [id number] DOB:  01-05-1956  Summary: Patient presents for CCM follow-up.   She reports home blood pressures are elevated, but she does not monitor regularly. Office Blood pressures indicate much better control. Asked patient to record blood pressures when checking.   Recommendations/Changes made from today's visit: Continue current medications   Plan: CPP follow-up in 3 months   Recommended Problem List Changes:  Add: Osteopenia of multiple sites   Subjective: Gabrielle Pineda is an 66 y.o. year old female who is a primary patient of Lavon Paganini MD.  The CCM team was consulted for assistance with disease management and care coordination needs.    Engaged with patient by telephone for follow up visit in response to provider referral for pharmacy case management and/or care coordination services.   Consent to Services:  The patient was given information about Chronic Care Management services, agreed to services, and gave verbal consent prior to initiation of services.  Please see initial visit note for detailed documentation.   Patient Care Team: Mikey Kirschner, PA-C as PCP - General (Physician Assistant) Minna Merritts, MD as PCP - Cardiology (Cardiology) Manya Silvas, MD (Inactive) (Gastroenterology) Germaine Pomfret, [facility] (Pharmacist)  Recent office visits: 06/08/22: Patient presented to Mikey Kirschner, PA-C for acute bronchitis.  05/14/22: Patient presented to Mikey Kirschner, PA-C for acute bronchitis.  04/21/22: Patient presented to Mikey Kirschner, PA-C for COPD Exacerbation.   Recent consult visits: 07/17/22: Patient presented to Dr. Winfield Rast (Rheumatology)  07/07/22: Patient presented to Dr. Patsey Berthold (Pulmonology) for follow-up. Referral to pulmonary rehab.  05/28/22: Patient presented to Dr. Patsey Berthold (Pulmonology) for bronchitis. Methylprednisolone.  05/06/22: Patient  presented to Rexene Edison, NP (Pulmonolgy) for COPD exacerbation. Levaquin, prednisone.  04/06/22: Patient presented to Dr. Winfield Rast (Rheumatology)  03/31/22: Patient presented to Dr. Shea Evans (Psychiatry).   Hospital visits: 04/15/21: Patient hospitalized for hip surgery. Lovenox 40 mg daily. Ibuprofen, Meloxicam stopped.  Admitted to the hospital on 08/018/2022 due to Thoracic Spinal Cord Stimulator & Pulse generator Placement. Discharge date was 03/17/2021. Discharged from [facility]  Objective:  Lab Results  Component Value Date   CREATININE 1.03 (H) 06/08/2022   BUN 9 06/08/2022   GFRNONAA >60 06/08/2022   GFRAA 72 05/06/2020   NA 135 06/08/2022   K 3.8 06/08/2022   CALCIUM 8.1 (L) 06/08/2022   CO2 21 (L) 06/08/2022   GLUCOSE 89 06/08/2022    Lab Results  Component Value Date/Time   HGBA1C 5.3 06/26/2021 09:13 AM   HGBA1C 5.0 04/14/2018 11:24 AM    Last diabetic Eye exam: No results found for: "HMDIABEYEEXA"  Last diabetic Foot exam: No results found for: "HMDIABFOOTEX"   Lab Results  Component Value Date   CHOL 153 11/17/2021   HDL 40 11/17/2021   LDLCALC 79 11/17/2021   TRIG 202 (H) 11/17/2021   CHOLHDL 3.8 11/17/2021       Latest Ref Rng & Units 06/08/2022   12:40 PM 03/10/2022   10:11 AM 11/17/2021    9:59 AM  Hepatic Function  Total Protein 6.5 - 8.1 g/dL 6.9  6.2  6.6   Albumin 3.5 - 5.0 g/dL 3.5  3.8  4.2   AST 15 - 41 U/L 37  22  21   ALT 0 - 44 U/L _0 Alk Phosphatase 38 - 126 U/L 113  103  173   Total Bilirubin 0.3 -  1.2 mg/dL 0.4  0.5  <0.2     Lab Results  Component Value Date/Time   TSH 3.673 03/10/2022 10:11 AM   TSH 2.170 11/17/2021 09:59 AM   TSH 1.310 04/17/2019 10:37 AM   FREET4 0.82 11/17/2021 09:59 AM       Latest Ref Rng & Units 06/08/2022   12:40 PM 03/10/2022   10:11 AM 02/20/2022    8:42 AM  CBC  WBC 4.0 - 10.5 K/uL 11.3  5.7  4.8   Hemoglobin 12.0 - 15.0 g/dL 9.7  9.8  10.0   Hematocrit  36.0 - 46.0 % 31.7  31.1  30.7   Platelets 150 - 400 K/uL 195  223  252     No results found for: "VD25OH"  Clinical ASCVD: No  The 10-year ASCVD risk score (Arnett DK, et al., 2019) is: 14.2%   Values used to calculate the score:     Age: 25 years     Sex: Female     Is Non-Hispanic African American: No     Diabetic: No     Tobacco smoker: Yes     Systolic Blood Pressure: 366 mmHg     Is BP treated: Yes     HDL Cholesterol: 40 mg/dL     Total Cholesterol: 153 mg/dL       05/13/2022    2:19 PM 04/22/2022    9:11 AM 04/21/2022    8:34 AM  Depression screen PHQ 2/9  Decreased Interest 0 0 0  Down, Depressed, Hopeless 0 0 0  PHQ - 2 Score 0 0 0  Altered sleeping 0  1  Tired, decreased energy 1  1  Change in appetite 0  0  Feeling bad or failure about yourself  0  0  Trouble concentrating 0  0  Moving slowly or fidgety/restless 0  0  Suicidal thoughts 0  0  PHQ-9 Score 1  2  Difficult doing work/chores Not difficult at all  Not difficult at all    Social History   Tobacco Use  Smoking Status Former   Packs/day: 2.00   Years: 40.00   Total pack years: 80.00   Types: Cigarettes   Quit date: 10/30/2020   Years since quitting: 1.7  Smokeless Tobacco Never   BP Readings from Last 3 Encounters:  07/07/22 122/80  06/30/22 135/82  06/11/22 (!) 151/93   Pulse Readings from Last 3 Encounters:  07/07/22 72  06/30/22 73  06/11/22 94   Wt Readings from Last 3 Encounters:  07/07/22 175 lb 12.8 oz (79.7 kg)  06/30/22 170 lb (77.1 kg)  06/17/22 172 lb (78 kg)   BMI Readings from Last 3 Encounters:  07/07/22 25.96 kg/m  06/30/22 25.10 kg/m  06/17/22 25.40 kg/m    Assessment/Interventions: Review of patient past medical history, allergies, medications, health status, including review of consultants reports, laboratory and other test data, was performed as part of comprehensive evaluation and provision of chronic care management services.   SDOH:  (Social Determinants  of Health) assessments and interventions performed: Yes SDOH Interventions    Flowsheet Row Office Visit from 04/21/2022 in [place] from 11/03/2021 in [place] Management from 09/26/2021 in [place] Management from 07/25/2021 in [place] Management from 05/23/2021 in [place] Management from 03/28/2021 in [place] Interventions        Food Insecurity Interventions -- Intervention Not  Indicated -- -- -- --  Housing Interventions -- Intervention Not Indicated -- -- -- --  Transportation Interventions -- Intervention Not Indicated -- -- -- --  Depression Interventions/Treatment  PHQ2-9 Score <4 Follow-up Not Indicated -- -- -- -- --  Financial Strain Interventions -- _0   Physical Activity Interventions -- Patient Refused -- -- -- --  Stress Interventions -- Intervention Not Indicated -- -- -- --  Social Connections Interventions -- Intervention Not Indicated -- -- -- --           SDOH Screenings   Food Insecurity: No Food Insecurity (11/03/2021)  Housing: Low Risk  (11/03/2021)  Transportation Needs: No Transportation Needs (11/03/2021)  Alcohol Screen: Low Risk  (04/21/2022)  Depression (PHQ2-9): Low Risk  (05/13/2022)  Financial Resource Strain: Low Risk  (11/03/2021)  Physical Activity: Inactive (11/03/2021)  Social Connections: Moderately Isolated (11/03/2021)  Stress: Stress Concern Present (11/03/2021)  Tobacco Use: Medium Risk (07/16/2022)    CCM Care Plan  Allergies  Allergen Reactions   Sulfasalazine Other (See Comments)   Plaquenil [Hydroxychloroquine] Rash    Medications Reviewed Today     Reviewed by Delos Haring, RRT (Respiratory Therapist) on 07/16/22 at [place]  List Status: <None>   Medication Order Taking? Sig Documenting Provider Last Dose Status Informant  acyclovir ointment (ZOVIRAX) 5 % [id number] Yes SMARTSIG:Topical Every 3 Hours [provider] Taking Active   albuterol (PROVENTIL) (2.5 MG/3ML) 0.083% nebulizer solution [id number] Yes Take 3 mLs (2.5 mg total) by nebulization every 4 (four) hours as needed for wheezing or shortness of breath. Mikey Kirschner, PA-C Taking Active   albuterol (VENTOLIN HFA) 108 (90 Base) MCG/ACT inhaler [id number] Yes INHALE TWO PUFFS BY MOUTH INTO LUNGS every SIX hours AS NEEDED FOR WHEEZING AND/OR SHORTNESS OF BREATH Mikey Kirschner, PA-C Taking Active   ARIPiprazole (ABILIFY) 5 MG tablet [id number] Yes Take 1 tablet (5 mg total) by mouth in the morning. Ursula Alert, MD Taking Active   atorvastatin (LIPITOR) 40 MG tablet [id number] Yes Take 1 tablet (40 mg total) by mouth every morning. Minna Merritts, MD Taking Active   Budeson-Glycopyrrol-Formoterol (BREZTRI AEROSPHERE) 160-9-4.8 MCG/ACT Hollie Salk [id number] Yes Inhale 2 puffs into the lungs in the morning and at bedtime. Tyler Pita, MD Taking Active   busPIRone (BUSPAR) 30 MG tablet [id number] Yes Take by mouth. Take 30 mg by mouth 3 (three) times a day [provider] Taking Active   carvedilol (COREG) 12.5 MG tablet [id number] Yes TAKE ONE TABLET BY MOUTH EVERY MORNING and TAKE ONE TABLET BY MOUTH EVERYDAY AT BEDTIME Gollan, Kathlene November, MD Taking Active   ezetimibe (ZETIA) 10 MG tablet [id number] Yes Take 1 tablet (10 mg total) by mouth every morning. Minna Merritts, MD Taking Active   gabapentin (NEURONTIN) 400 MG capsule [id number] Yes Take 1 capsule (400 mg total) by mouth 2 (two) times daily. [name], MD Taking Active   leflunomide (ARAVA) 20 MG tablet [id number] Yes Take 20 mg by mouth at bedtime. [provider] Taking Active   LINZESS 145 MCG CAPS capsule [id number] Yes Take 145 mcg by mouth daily before breakfast. [provider] Taking Active Self  meloxicam (MOBIC) 15 MG tablet [id number] Yes Take 15 mg by mouth at bedtime. [provider] Taking Active   mirtazapine (REMERON) 15 MG tablet [id number] Yes TAKE ONE TABLET BY MOUTH EVERYDAY AT BEDTIME Ursula Alert, MD Taking Active  montelukast (SINGULAIR) 10 MG tablet [id number] Yes Take 1 tablet (10 mg total) by mouth at bedtime. Gwyneth Sprout, FNP Taking Active   Multiple Vitamin (MULTIVITAMIN WITH MINERALS) TABS tablet [id number] Yes Take 1 tablet by mouth daily. [provider] Taking Active Self  Nebulizer [place] [id number] Yes Compressor nebulizer to use with nebulized medications as instructed Drubel, Ria Comment, PA-C Taking Active   omeprazole (PRILOSEC) 40 MG capsule [id number] Yes Take 1 capsule (40 mg total) by mouth 2 (two) times daily. Mikey Kirschner, PA-C Taking Active   oxyCODONE-acetaminophen (PERCOCET) 5-325 MG tablet [id number] No Take 1 tablet by mouth every 8 (eight) hours as needed for severe pain. Must last 30 days.  Patient not taking: Reported on 07/16/2022   [name], MD Not Taking Active   oxyCODONE-acetaminophen (PERCOCET) 5-325 MG tablet [id number] No Take 1 tablet by mouth every 8 (eight) hours as needed for severe pain. Must last 30 days.  Patient not taking: Reported on 07/16/2022   [name], MD Not Taking Active   oxyCODONE-acetaminophen (PERCOCET) 5-325 MG tablet [id number] Yes Take 1 tablet by mouth every 8 (eight) hours as needed for severe pain. Must last 30 days. [name], MD Taking Active   potassium chloride SA (KLOR-CON M) 20 MEQ tablet [id number] Yes Take 1 tablet (20 mEq total) by mouth daily with breakfast. Mikey Kirschner, PA-C Taking Active   topiramate (TOPAMAX) 25 MG tablet [id number] Yes Take 1 tablet (25 mg total) by mouth 2 (two) times daily. [name], MD Taking Active   valACYclovir (VALTREX) 1000 MG tablet [id number] Yes TAKE TWO TABLETS BY MOUTH TWICE DAILY AS NEEDED FOR 5 DAYS FOR  FEVER blisters Drubel, Ria Comment, PA-C Taking Active   venlafaxine XR (EFFEXOR-XR) 75 MG 24 hr capsule [id number] Yes Take 3 capsules (225 mg total) by mouth daily with breakfast. TAKE 3 CAPSULES BY MOUTH ONCE DAILY WITH Eldridge Abrahams, MD Taking Active   XIIDRA 5 % SOLN [id number] Yes Place 1 drop into both eyes daily. [provider] Taking Active Self  Med List Note Dewayne Shorter, RN 04/07/22 1660): UDS 08/23/21 Mr12-04-2022            Patient Active Problem List   Diagnosis Date Noted   MDD (major depressive disorder), recurrent, in full remission ([place]) 03/31/2022   Macrocytic anemia 02/20/2022   Overweight (BMI 25.0-29.9) 02/20/2022   Dry mouth 02/20/2022   COPD (chronic obstructive pulmonary disease) ([place]) 02/06/2022   Physical deconditioning 02/06/2022   Lung nodules 02/06/2022   Allergic rhinitis 02/06/2022   Clavi 01/22/2022   GAD (generalized anxiety disorder) 12/26/2021   Alcohol use disorder, moderate, in sustained remission ([place]) 12/26/2021   Need for vaccination against Streptococcus pneumoniae 11/17/2021   Sleep disorder 10/23/2021   Iron deficiency anemia 06/26/2021   Depression, major, single episode, moderate ([place]) 06/26/2021   Status post total hip replacement, right 04/15/2021   Chronic radicular lumbar pain 10/03/2020   Postlaminectomy syndrome, lumbar region 06/03/2020   Moderate episode of recurrent major depressive disorder ([place]) 12/04/2019   Spondylosis of cervical region without myelopathy or radiculopathy 09/21/2019   DDD (degenerative disc disease), cervical 09/21/2019   Cervicalgia 09/21/2019   Cervical fusion syndrome 09/21/2019   Chronic pain syndrome 09/21/2019   Spinal stenosis, lumbar region, with neurogenic claudication 08/15/2019   History of adenomatous polyp of colon 11/04/2017   Cervical radiculopathy 08/02/2017   Neuropathy 08/02/2017   Essential hypertension 07/28/2017   Closed compression fracture of L5 lumbar  vertebra 07/07/2017  Sacral insufficiency fracture with routine healing 07/07/2017   COPD exacerbation ([place]) 04/10/2015   Family history of malignant neoplasm of pancreas 04/03/2015   Hypercholesteremia 04/03/2015   Disorder of iron metabolism 04/03/2015   Carpal tunnel syndrome 03/29/2015   Acid reflux 10/11/2014   Closed fracture of distal phalanx of thumb 08/15/2014   Arthritis, degenerative 01/30/2014   Arthritis or polyarthritis, rheumatoid (HCC) 01/30/2014   Paroxysmal supraventricular tachycardia 09/22/2010   B-complex deficiency 09/09/2007   CN (constipation) 06/26/2007   Clinical depression 06/26/2007   Cold sore 06/26/2007   H/O alcohol abuse 06/26/2007   Cannot sleep 06/26/2007   Localized osteoarthrosis, hand 06/26/2007   Menopausal symptom 06/26/2007    Immunization History  Administered Date(s) Administered   Fluad Quad(high Dose 65+) 10/01/2021, 05/14/2022   Influenza Split 09/07/2012   Influenza,inj,Quad PF,6+ Mos 11/18/2015, 07/23/2016, 07/02/2017, 04/14/2018, 04/17/2019, 04/19/2020   Influenza-Unspecified 09/24/2014   PFIZER(Purple Top)SARS-COV-2 Vaccination 11/21/2019, 12/12/2019   PNEUMOCOCCAL CONJUGATE-20 11/17/2021   Pneumococcal Polysaccharide-23 02/12/2011, 09/24/2014, 04/19/2020   Tdap 02/12/2011, 11/15/2015    Conditions to be addressed/monitored:  Hypertension, Hyperlipidemia, COPD, Depression, Tobacco use, and Insomnia, Chronic Pain, and Rheumatoid Arthritis  Care Plan : General Pharmacy (Adult)  Updates made by Germaine Pomfret, RPH since 07/20/2022 12:00 AM     Problem: Hypertension, Hyperlipidemia, COPD, Depression, Tobacco use and Chronic Pain, and Rheumatoid Arthritis   Priority: High     Long-Range Goal: Patient-Specific Goal   Start Date: 11/25/2020  Expected End Date: 07/21/2023  This Visit's Progress: On track  Recent Progress: On track  Priority: High  Note:   Current Barriers:  Unable to independently afford treatment  regimen Unable to achieve control of COPD  Suboptimal therapeutic regimen for depression  Pharmacist Clinical Goal(s):  Patient will verbalize ability to afford treatment regimen achieve control of COPD as evidenced by stable breathing, avoiding cigarette use, and absence of exacerbations adhere to plan to optimize therapeutic regimen for depression as evidenced by report of adherence to recommended medication management changes through collaboration with PharmD and provider.   Interventions: 1:1 collaboration with Mikey Kirschner, PA-C regarding development and update of comprehensive plan of care as evidenced by provider attestation and co-signature Inter-disciplinary care team collaboration (see longitudinal plan of care) Comprehensive medication review performed; medication list updated in electronic medical record  Hypertension (BP goal <140/90) -Uncontrolled -Current treatment: Carvedilol 12.5 mg twice daily  Furosemide 20 mg daily as needed  -Medications previously tried: Diltiazem,   -Current home readings:   Has been as high as 162/77. 187/85  -Denies hypotensive/hypertensive symptoms -Dietary Habits: Picky eater, doesn't like many vegetables. Rarely cooks at home, but sister will cook for her. Denies caffeine intake.   Supper: Steak + Potatoes + Corn OR Mac and Cheese  -Recommended to continue current medication for now.  Hyperlipidemia: (LDL goal < 70) -Controlled -Current treatment: Atorvastatin 40 mg daily  Ezetimibe 10 mg daily  -Medications previously tried: Pravastatin, Rosuvastatin  -Educated on Benefits of statin for ASCVD risk reduction; -Recommended to continue current medication  COPD (Goal: control symptoms and prevent exacerbations) -Uncontrolled -Current treatment  Ipratropium 0.02% nebulizer 2.5 mL every 4 hours Albuterol 2 puffs every 6 hours as needed  Breztri 2 puffs twice daily  Montelukast 10 mg nightly.  -Medications previously tried: NA   -Gold Grade: Gold 2 (FEV1 50-79%) -Current COPD Classification:  B (high sx, <2 exacerbations/yr) -MMRC/CAT score: NA -Pulmonary function testing: 71% (2017) -Exacerbations requiring treatment in last 6 months: Frequent exacerbations requriing outpat tx  -  Frequency of rescue nebulizer use: daily  -Frequency of rescue inhaler use: 2-3 times weekly.  -Patient has pulmonology  -Recommended to continue current medication  Depression/Anxiety (Goal: Maintain symptom remission) -Controlled -Current treatment: Aripiprazole 5 mg daily  Buspirone 30 mg three times daily  Mirtazapine 15 mg nightly  Venlafaxine XR 75 mg 3 capsules daily -Medications previously tried/failed: Sertraline, Buspar  -PHQ9: 6 -GAD7: 9 -Continue current medications   Osteopenia (Goal Prevent fractures) -Not ideally controlled -Last DEXA Scan: 12/23/21   T-Score femoral neck: NA  T-Score total hip: -1.3  T-Score lumbar spine: -1.2  T-Score forearm radius: -1.3  10-year probability of major osteoporotic fracture: 17.6%  10-year probability of hip fracture: 1.8% -Patient is not a candidate for pharmacologic treatment -Current treatment  None -Medications previously tried: NA  -Recommend [phone] units of vitamin D daily. Recommend 1200 mg of calcium daily from dietary and supplemental sources.   Patient Goals/Self-Care Activities Patient will:  - check blood pressure weekly, document, and provide at future appointments Utilize maintenance inhaler daily  Follow Up Plan: Telephone follow up appointment with care management team member scheduled for:  10/19/2022 at 10:00 AM    Medication Assistance:  Application for Trelegy denied  Patient's preferred pharmacy is:  [facility] - [place], [place] - [address] [place] [address] Phone: [phone] Fax: [phone]  [place] ([address]), [place] - [place] ([place]) [place] [address] Phone: [phone] Fax: [place], [place] - [place] [address] Phone: [phone] Fax: [phone]   Patient decided to: Utilize [facility] for medication synchronization, packaging and delivery   Care Plan and Follow Up Patient Decision:  Patient agrees to Care Plan and Follow-up.  Plan: Telephone follow up appointment with care management team member scheduled for:  10/19/2022 at 10:00 AM  Junius Argyle, PharmD, [place], [place] [phone]

## 2022-07-20 NOTE — Patient Instructions (Signed)
Visit Information It was great speaking with you today!  Please let me know if you have any questions about our visit.   Goals Addressed             This Visit's Progress    Track and Manage My Triggers-COPD   On track    Timeframe:  Long-Range Goal Priority:  High Start Date: 11/25/2020                            Expected End Date:  05/28/2023                     Follow Up within 30 days   - avoid second hand smoke - eliminate smoking in my home - identify and remove indoor air pollutants - limit outdoor activity during cold weather    Why is this important?   Triggers are activities or things, like tobacco smoke or cold weather, that make your COPD (chronic obstructive pulmonary disease) flare-up.  Knowing these triggers helps you plan how to stay away from them.  When you cannot remove them, you can learn how to manage them.     Notes:         Patient Care Plan: General Pharmacy (Adult)     Problem Identified: Hypertension, Hyperlipidemia, COPD, Depression, Tobacco use and Chronic Pain, and Rheumatoid Arthritis   Priority: High     Long-Range Goal: Patient-Specific Goal   Start Date: 11/25/2020  Expected End Date: 07/21/2023  This Visit's Progress: On track  Recent Progress: On track  Priority: High  Note:   Current Barriers:  Unable to independently afford treatment regimen Unable to achieve control of COPD  Suboptimal therapeutic regimen for depression  Pharmacist Clinical Goal(s):  Patient will verbalize ability to afford treatment regimen achieve control of COPD as evidenced by stable breathing, avoiding cigarette use, and absence of exacerbations adhere to plan to optimize therapeutic regimen for depression as evidenced by report of adherence to recommended medication management changes through collaboration with PharmD and provider.   Interventions: 1:1 collaboration with Mikey Kirschner, PA-C regarding development and update of comprehensive plan of  care as evidenced by provider attestation and co-signature Inter-disciplinary care team collaboration (see longitudinal plan of care) Comprehensive medication review performed; medication list updated in electronic medical record  Hypertension (BP goal <140/90) -Uncontrolled -Current treatment: Carvedilol 12.5 mg twice daily  Furosemide 20 mg daily as needed  -Medications previously tried: Diltiazem,   -Current home readings:   Has been as high as 162/77. 187/85  -Denies hypotensive/hypertensive symptoms -Dietary Habits: Picky eater, doesn't like many vegetables. Rarely cooks at home, but sister will cook for her. Denies caffeine intake.   Supper: Steak + Potatoes + Corn OR Mac and Cheese  -Recommended to continue current medication for now.  Hyperlipidemia: (LDL goal < 70) -Controlled -Current treatment: Atorvastatin 40 mg daily  Ezetimibe 10 mg daily  -Medications previously tried: Pravastatin, Rosuvastatin  -Educated on Benefits of statin for ASCVD risk reduction; -Recommended to continue current medication  COPD (Goal: control symptoms and prevent exacerbations) -Uncontrolled -Current treatment  Ipratropium 0.02% nebulizer 2.5 mL every 4 hours Albuterol 2 puffs every 6 hours as needed  Breztri 2 puffs twice daily  Montelukast 10 mg nightly.  -Medications previously tried: NA  -Gold Grade: Gold 2 (FEV1 50-79%) -Current COPD Classification:  B (high sx, <2 exacerbations/yr) -MMRC/CAT score: NA -Pulmonary function testing: 71% (2017) -Exacerbations requiring treatment in last  6 months: Frequent exacerbations requriing outpat tx  -Frequency of rescue nebulizer use: daily  -Frequency of rescue inhaler use: 2-3 times weekly.  -Patient has pulmonology  -Recommended to continue current medication  Depression/Anxiety (Goal: Maintain symptom remission) -Controlled -Current treatment: Aripiprazole 5 mg daily  Buspirone 30 mg three times daily  Mirtazapine 15 mg nightly   Venlafaxine XR 75 mg 3 capsules daily -Medications previously tried/failed: Sertraline, Buspar  -PHQ9: 6 -GAD7: 9 -Continue current medications   Osteopenia (Goal Prevent fractures) -Not ideally controlled -Last DEXA Scan: 12/23/21   T-Score femoral neck: NA  T-Score total hip: -1.3  T-Score lumbar spine: -1.2  T-Score forearm radius: -1.3  10-year probability of major osteoporotic fracture: 17.6%  10-year probability of hip fracture: 1.8% -Patient is not a candidate for pharmacologic treatment -Current treatment  None -Medications previously tried: NA  -Recommend [phone] units of vitamin D daily. Recommend 1200 mg of calcium daily from dietary and supplemental sources.   Patient Goals/Self-Care Activities Patient will:  - check blood pressure weekly, document, and provide at future appointments Utilize maintenance inhaler daily  Follow Up Plan: Telephone follow up appointment with care management team member scheduled for:  10/19/2022 at 10:00 AM      Patient agreed to services and verbal consent obtained.   Patient verbalizes understanding of instructions and care plan provided today and agrees to view in [place]. Active MyChart status and patient understanding of how to access instructions and care plan via MyChart confirmed with patient.     Junius Argyle, PharmD, Para [place], CPP  Clinical Pharmacist Practitioner  [facility] [phone]

## 2022-07-27 ENCOUNTER — Encounter: Payer: Medicare Other | Attending: Physician Assistant | Admitting: *Deleted

## 2022-07-27 VITALS — Ht 67.0 in | Wt 175.9 lb

## 2022-07-27 DIAGNOSIS — J449 Chronic obstructive pulmonary disease, unspecified: Secondary | ICD-10-CM | POA: Diagnosis not present

## 2022-07-27 NOTE — Progress Notes (Signed)
Pulmonary Individual Treatment Plan  Patient Details  Name: Gabrielle Pineda MRN: [id number] Date of Birth: 1956/01/25 Referring Provider:   Flowsheet Row Pulmonary Rehab from 07/27/2022 in [facility] Cardiac and Pulmonary Rehab  Referring Provider [name] MD       Initial Encounter Date:  Flowsheet Row Pulmonary Rehab from 07/27/2022 in [facility] Cardiac and Pulmonary Rehab  Date 07/27/22       Visit Diagnosis: Chronic obstructive pulmonary disease, unspecified COPD type ([place])  Patient's Home Medications on Admission:  Current Outpatient Medications:    acyclovir ointment (ZOVIRAX) 5 %, SMARTSIG:Topical Every 3 Hours, Disp: , Rfl:    albuterol (PROVENTIL) (2.5 MG/3ML) 0.083% nebulizer solution, Take 3 mLs (2.5 mg total) by nebulization every 4 (four) hours as needed for wheezing or shortness of breath., Disp: 225 mL, Rfl: 2   albuterol (VENTOLIN HFA) 108 (90 Base) MCG/ACT inhaler, INHALE TWO PUFFS BY MOUTH INTO LUNGS every SIX hours AS NEEDED FOR WHEEZING AND/OR SHORTNESS OF BREATH, Disp: 8.5 g, Rfl: 2   ARIPiprazole (ABILIFY) 5 MG tablet, Take 1 tablet (5 mg total) by mouth in the morning., Disp: 90 tablet, Rfl: 1   atorvastatin (LIPITOR) 40 MG tablet, Take 1 tablet (40 mg total) by mouth every morning., Disp: 90 tablet, Rfl: 3   Budeson-Glycopyrrol-Formoterol (BREZTRI AEROSPHERE) 160-9-4.8 MCG/ACT AERO, Inhale 2 puffs into the lungs in the morning and at bedtime., Disp: 10.7 g, Rfl: 11   busPIRone (BUSPAR) 30 MG tablet, Take by mouth. Take 30 mg by mouth 3 (three) times a day, Disp: , Rfl:    carvedilol (COREG) 12.5 MG tablet, TAKE ONE TABLET BY MOUTH EVERY MORNING and TAKE ONE TABLET BY MOUTH EVERYDAY AT BEDTIME, Disp: 180 tablet, Rfl: 3   ezetimibe (ZETIA) 10 MG tablet, Take 1 tablet (10 mg total) by mouth every morning., Disp: 90 tablet, Rfl: 3   gabapentin (NEURONTIN) 400 MG capsule, Take 1 capsule (400 mg total) by mouth 2 (two) times daily., Disp: 60 capsule, Rfl: 5    leflunomide (ARAVA) 20 MG tablet, Take 20 mg by mouth at bedtime., Disp: , Rfl:    LINZESS 145 MCG CAPS capsule, Take 145 mcg by mouth daily before breakfast., Disp: , Rfl:    meloxicam (MOBIC) 15 MG tablet, Take 15 mg by mouth at bedtime., Disp: , Rfl:    mirtazapine (REMERON) 15 MG tablet, TAKE ONE TABLET BY MOUTH EVERYDAY AT BEDTIME, Disp: 90 tablet, Rfl: 0   montelukast (SINGULAIR) 10 MG tablet, Take 1 tablet (10 mg total) by mouth at bedtime., Disp: 30 tablet, Rfl: 11   Multiple Vitamin (MULTIVITAMIN WITH MINERALS) TABS tablet, Take 1 tablet by mouth daily., Disp: , Rfl:    Nebulizer MISC, Compressor nebulizer to use with nebulized medications as instructed, Disp: 1 each, Rfl: 0   omeprazole (PRILOSEC) 40 MG capsule, Take 1 capsule (40 mg total) by mouth 2 (two) times daily., Disp: 180 capsule, Rfl: 1   oxyCODONE-acetaminophen (PERCOCET) 5-325 MG tablet, Take 1 tablet by mouth every 8 (eight) hours as needed for severe pain. Must last 30 days. (Patient not taking: Reported on 07/16/2022), Disp: 90 tablet, Rfl: 0   [START ON 08/20/2022] oxyCODONE-acetaminophen (PERCOCET) 5-325 MG tablet, Take 1 tablet by mouth every 8 (eight) hours as needed for severe pain. Must last 30 days. (Patient not taking: Reported on 07/16/2022), Disp: 90 tablet, Rfl: 0   [START ON 09/19/2022] oxyCODONE-acetaminophen (PERCOCET) 5-325 MG tablet, Take 1 tablet by mouth every 8 (eight) hours as needed for severe pain.  Must last 30 days., Disp: 90 tablet, Rfl: 0   potassium chloride SA (KLOR-CON M) 20 MEQ tablet, Take 1 tablet (20 mEq total) by mouth daily with breakfast., Disp: 90 tablet, Rfl: 1   topiramate (TOPAMAX) 25 MG tablet, Take 1 tablet (25 mg total) by mouth 2 (two) times daily., Disp: 60 tablet, Rfl: 11   valACYclovir (VALTREX) 1000 MG tablet, TAKE TWO TABLETS BY MOUTH TWICE DAILY AS NEEDED FOR 5 DAYS FOR FEVER blisters, Disp: 30 tablet, Rfl: 1   venlafaxine XR (EFFEXOR-XR) 75 MG 24 hr capsule, Take 3 capsules (225 mg  total) by mouth daily with breakfast. TAKE 3 CAPSULES BY MOUTH ONCE DAILY WITH BREAKFAST, Disp: 270 capsule, Rfl: 1   XIIDRA 5 % SOLN, Place 1 drop into both eyes daily., Disp: , Rfl:   Past Medical History: Past Medical History:  Diagnosis Date   Alcohol abuse    Anemia    Anxiety    Arthritis    Cervicalgia    Cirrhosis ([place]) 1994   COPD (chronic obstructive pulmonary disease) ([place])    Depression    Difficult intubation    has plates and screws in neck   Dyspnea    GERD (gastroesophageal reflux disease)    Headache    Heart murmur    on heard when pt is lying   Hypertension    Other and unspecified hyperlipidemia    Status post insertion of spinal cord stimulator    Tachycardia    d/t questionable anxiety happens every 5- 10 years, sees [facility]    Tobacco Use: Social History   Tobacco Use  Smoking Status Former   Packs/day: 2.00   Years: 40.00   Total pack years: 80.00   Types: Cigarettes   Quit date: 10/30/2020   Years since quitting: 1.7  Smokeless Tobacco Never    Labs: Review Flowsheet  More data exists      Latest Ref Rng & Units 04/17/2019 04/19/2020 03/17/2021 06/26/2021 11/17/2021  Labs for ITP Cardiac and Pulmonary Rehab  Cholestrol 100 - 199 mg/dL 158  172  - 120  153   LDL (calc) 0 - 99 mg/dL 80  98  - 50  79   HDL-C >39 mg/dL 54  56  - 39  40   Trlycerides 0 - 149 mg/dL 135  102  - 190  202   Hemoglobin A1c 4.8 - 5.6 % - - - 5.3  -  TCO2 22 - 32 mmol/L - - 21  - -     Pulmonary Assessment Scores:  Pulmonary Assessment Scores     Row Name 07/27/22 1435         ADL UCSD   ADL Phase Entry     SOB Score total 71     Rest 0     Walk 2     Stairs 5     Bath 2     Dress 3     Shop 4       CAT Score   CAT Score 17       mMRC Score   mMRC Score 2              UCSD: Self-administered rating of dyspnea associated with activities of daily living (ADLs) 6-point scale (0 = "not at all" to 5 = "maximal or unable to do because of  breathlessness")  Scoring Scores range from 0 to 120.  Minimally important difference is 5 units  CAT: CAT can identify  the health impairment of COPD patients and is better correlated with disease progression.  CAT has a scoring range of zero to 40. The CAT score is classified into four groups of low (less than 10), medium (10 - 20), high (21-30) and very high (31-40) based on the impact level of disease on health status. A CAT score over 10 suggests significant symptoms.  A worsening CAT score could be explained by an exacerbation, poor medication adherence, poor inhaler technique, or progression of COPD or comorbid conditions.  CAT MCID is 2 points  mMRC: mMRC (Modified Medical Research Council) Dyspnea Scale is used to assess the degree of baseline functional disability in patients of respiratory disease due to dyspnea. No minimal important difference is established. A decrease in score of 1 point or greater is considered a positive change.   Pulmonary Function Assessment:  Pulmonary Function Assessment - 07/27/22 1433       Pulmonary Function Tests   FVC% 64 %   Collected 02/04/22   FEV1% 54 %    FEV1/FVC Ratio 64    RV% 144 %    DLCO% 51 %      Breath   Shortness of Breath Yes;Limiting activity;Fear of Shortness of Breath             Exercise Target Goals: Exercise Program Goal: Individual exercise prescription set using results from initial 6 min walk test and THRR while considering  patient's activity barriers and safety.   Exercise Prescription Goal: Initial exercise prescription builds to 30-45 minutes a day of aerobic activity, 2-3 days per week.  Home exercise guidelines will be given to patient during program as part of exercise prescription that the participant will acknowledge.  Education: Aerobic Exercise: - Group verbal and visual presentation on the components of exercise prescription. Introduces F.I.T.T principle from ACSM for exercise prescriptions.  Reviews  F.I.T.T. principles of aerobic exercise including progression. Written material given at graduation.   Education: Resistance Exercise: - Group verbal and visual presentation on the components of exercise prescription. Introduces F.I.T.T principle from ACSM for exercise prescriptions  Reviews F.I.T.T. principles of resistance exercise including progression. Written material given at graduation.    Education: Exercise & Equipment Safety: - Individual verbal instruction and demonstration of equipment use and safety with use of the equipment. Flowsheet Row Pulmonary Rehab from 07/27/2022 in [facility] Cardiac and Pulmonary Rehab  Date 07/16/22  Educator [facility]  Instruction Review Code 1- Verbalizes Understanding       Education: Exercise Physiology & General Exercise Guidelines: - Group verbal and written instruction with models to review the exercise physiology of the cardiovascular system and associated critical values. Provides general exercise guidelines with specific guidelines to those with heart or lung disease.    Education: Flexibility, Balance, Mind/Body Relaxation: - Group verbal and visual presentation with interactive activity on the components of exercise prescription. Introduces F.I.T.T principle from ACSM for exercise prescriptions. Reviews F.I.T.T. principles of flexibility and balance exercise training including progression. Also discusses the mind body connection.  Reviews various relaxation techniques to help reduce and manage stress (i.e. Deep breathing, progressive muscle relaxation, and visualization). Balance handout provided to take home. Written material given at graduation.   Activity Barriers & Risk Stratification:   6 Minute Walk:  6 Minute Walk     Row Name 07/27/22 1425         6 Minute Walk   Phase Initial     Distance 1035 feet     Walk Time 6 minutes     #  of Rest Breaks 0     MPH 1.96     METS 3.24     RPE 14     Perceived Dyspnea  2     VO2 Peak 11.33      Symptoms Yes (comment)     Comments Hip pain 6/10     Resting HR 70 bpm     Resting BP 126/70     Resting Oxygen Saturation  94 %     Exercise Oxygen Saturation  during 6 min walk 92 %     Max Ex. HR 110 bpm     Max Ex. BP 184/74     2 Minute Post BP 154/72       Interval HR   1 Minute HR 96     2 Minute HR 100     3 Minute HR 99     4 Minute HR 97     5 Minute HR 103     6 Minute HR 110     2 Minute Post HR 77     Interval Heart Rate? Yes       Interval Oxygen   Interval Oxygen? Yes     Baseline Oxygen Saturation % 94 %     1 Minute Oxygen Saturation % 93 %     1 Minute Liters of Oxygen 0 L  Room Air     2 Minute Oxygen Saturation % 92 %     2 Minute Liters of Oxygen 0 L     3 Minute Oxygen Saturation % 93 %     3 Minute Liters of Oxygen 0 L     4 Minute Oxygen Saturation % 93 %     4 Minute Liters of Oxygen 0 L     5 Minute Oxygen Saturation % 93 %     5 Minute Liters of Oxygen 0 L     6 Minute Oxygen Saturation % 93 %     6 Minute Liters of Oxygen 0 L     2 Minute Post Oxygen Saturation % 94 %     2 Minute Post Liters of Oxygen 0 L             Oxygen Initial Assessment:  Oxygen Initial Assessment - 07/16/22 0910       Home Oxygen   Home Oxygen Device Home Concentrator    Sleep Oxygen Prescription Continuous    Liters per minute 2    Home Exercise Oxygen Prescription None    Home Resting Oxygen Prescription None    Compliance with Home Oxygen Use Yes      Initial 6 min Walk   Oxygen Used None      Program Oxygen Prescription   Program Oxygen Prescription None      Intervention   Short Term Goals To learn and exhibit compliance with exercise, home and travel O2 prescription;To learn and understand importance of maintaining oxygen saturations>88%;To learn and demonstrate proper pursed lip breathing techniques or other breathing techniques. ;To learn and understand importance of monitoring SPO2 with pulse oximeter and demonstrate accurate use of the  pulse oximeter.;To learn and demonstrate proper use of respiratory medications    Long  Term Goals Exhibits compliance with exercise, home  and travel O2 prescription;Maintenance of O2 saturations>88%;Compliance with respiratory medication;Demonstrates proper use of MDI's;Exhibits proper breathing techniques, such as pursed lip breathing or other method taught during program session;Verbalizes importance of monitoring SPO2 with pulse oximeter and return demonstration  Oxygen Re-Evaluation:   Oxygen Discharge (Final Oxygen Re-Evaluation):   Initial Exercise Prescription:  Initial Exercise Prescription - 07/27/22 1400       Date of Initial Exercise RX and Referring Provider   Date 07/27/22    Referring Provider [name] MD      Oxygen   Maintain Oxygen Saturation 88% or higher      Recumbant Bike   Level 2    RPM 50    Watts 30    Minutes 15    METs 3      NuStep   Level 3    SPM 80    Minutes 15    METs 3      REL-XR   Level 2    Speed 50    Minutes 15    METs 3      Track   Laps 27    Minutes 15    METs 2.47      Prescription Details   Frequency (times per week) 2    Duration Progress to 30 minutes of continuous aerobic without signs/symptoms of physical distress      Intensity   THRR 40-80% of Max Heartrate 104-138    Ratings of Perceived Exertion 11-13    Perceived Dyspnea 0-4      Progression   Progression Continue to progress workloads to maintain intensity without signs/symptoms of physical distress.      Resistance Training   Training Prescription Yes    Weight 3 lb    Reps 10-15             Perform Capillary Blood Glucose checks as needed.  Exercise Prescription Changes:   Exercise Prescription Changes     Row Name 07/27/22 1400             Response to Exercise   Blood Pressure (Admit) 126/70       Blood Pressure (Exercise) 184/74       Blood Pressure (Exit) 128/70       Heart Rate (Admit) 70 bpm        Heart Rate (Exercise) 110 bpm       Heart Rate (Exit) 77 bpm       Oxygen Saturation (Admit) 94 %       Oxygen Saturation (Exercise) 92 %       Oxygen Saturation (Exit) 93 %       Rating of Perceived Exertion (Exercise) 14       Perceived Dyspnea (Exercise) 2       Symptoms hip pain 6/10       Comments walk test resutls                Exercise Comments:   Exercise Goals and Review:   Exercise Goals     Row Name 07/27/22 1431             Exercise Goals   Increase Physical Activity Yes       Intervention Provide advice, education, support and counseling about physical activity/exercise needs.;Develop an individualized exercise prescription for aerobic and resistive training based on initial evaluation findings, risk stratification, comorbidities and participant's personal goals.       Expected Outcomes Long Term: Exercising regularly at least 3-5 days a week.;Short Term: Attend rehab on a regular basis to increase amount of physical activity.;Long Term: Add in home exercise to make exercise part of routine and to increase amount of physical activity.       Increase Strength  and Stamina Yes       Intervention Provide advice, education, support and counseling about physical activity/exercise needs.;Develop an individualized exercise prescription for aerobic and resistive training based on initial evaluation findings, risk stratification, comorbidities and participant's personal goals.       Expected Outcomes Short Term: Increase workloads from initial exercise prescription for resistance, speed, and METs.;Short Term: Perform resistance training exercises routinely during rehab and add in resistance training at home;Long Term: Improve cardiorespiratory fitness, muscular endurance and strength as measured by increased METs and functional capacity (6MWT)       Able to understand and use rate of perceived exertion (RPE) scale Yes       Intervention Provide education and explanation on  how to use RPE scale       Expected Outcomes Short Term: Able to use RPE daily in rehab to express subjective intensity level;Long Term:  Able to use RPE to guide intensity level when exercising independently       Able to understand and use Dyspnea scale Yes       Intervention Provide education and explanation on how to use Dyspnea scale       Expected Outcomes Short Term: Able to use Dyspnea scale daily in rehab to express subjective sense of shortness of breath during exertion;Long Term: Able to use Dyspnea scale to guide intensity level when exercising independently       Knowledge and understanding of Target Heart Rate Range (THRR) Yes       Intervention Provide education and explanation of THRR including how the numbers were predicted and where they are located for reference       Expected Outcomes Short Term: Able to state/look up THRR;Long Term: Able to use THRR to govern intensity when exercising independently;Short Term: Able to use daily as guideline for intensity in rehab       Able to check pulse independently Yes       Intervention Provide education and demonstration on how to check pulse in carotid and radial arteries.;Review the importance of being able to check your own pulse for safety during independent exercise       Expected Outcomes Short Term: Able to explain why pulse checking is important during independent exercise;Long Term: Able to check pulse independently and accurately       Understanding of Exercise Prescription Yes       Intervention Provide education, explanation, and written materials on patient's individual exercise prescription       Expected Outcomes Short Term: Able to explain program exercise prescription;Long Term: Able to explain home exercise prescription to exercise independently                Exercise Goals Re-Evaluation :   Discharge Exercise Prescription (Final Exercise Prescription Changes):  Exercise Prescription Changes - 07/27/22 1400        Response to Exercise   Blood Pressure (Admit) 126/70    Blood Pressure (Exercise) 184/74    Blood Pressure (Exit) 128/70    Heart Rate (Admit) 70 bpm    Heart Rate (Exercise) 110 bpm    Heart Rate (Exit) 77 bpm    Oxygen Saturation (Admit) 94 %    Oxygen Saturation (Exercise) 92 %    Oxygen Saturation (Exit) 93 %    Rating of Perceived Exertion (Exercise) 14    Perceived Dyspnea (Exercise) 2    Symptoms hip pain 6/10    Comments walk test resutls  Nutrition:  Target Goals: Understanding of nutrition guidelines, daily intake of sodium '1500mg'$ , cholesterol '200mg'$ , calories 30% from fat and 7% or less from saturated fats, daily to have 5 or more servings of fruits and vegetables.  Education: All About Nutrition: -Group instruction provided by verbal, written material, interactive activities, discussions, models, and posters to present general guidelines for heart healthy nutrition including fat, fiber, MyPlate, the role of sodium in heart healthy nutrition, utilization of the nutrition label, and utilization of this knowledge for meal planning. Follow up email sent as well. Written material given at graduation.   Biometrics:  Pre Biometrics - 07/27/22 1431       Pre Biometrics   Height '5\' 7"'$  (1.702 m)    Weight 175 lb 14.4 oz (79.8 kg)    Waist Circumference 40.5 inches    Hip Circumference 41 inches    Waist to Hip Ratio 0.99 %    BMI (Calculated) 27.54    Single Leg Stand 6.7 seconds              Nutrition Therapy Plan and Nutrition Goals:  Nutrition Therapy & Goals - 07/27/22 0938       Nutrition Therapy   Diet Heart healthy, low Na, pulmonary MNT    Drug/Food Interactions Statins/Certain Fruits    Protein (specify units) 85-95g    Fiber 25 grams    Whole Grain Foods 3 servings    Saturated Fats 12 max. grams    Fruits and Vegetables 8 servings/day    Sodium 2 grams      Personal Nutrition Goals   Nutrition Goal ST: review paperwork, try  eating small melas/snacks, try ensure complete LT: meet protein and energy needs, eat a variety of whole foods    Comments 66 y.o. F admitted to pulmonary rehab for COPD. PMHx includes HTN, HLD, GERD, former tobacco use, former alcohol abuse, macrocytic anemia. Relevant medications includes lipitor, buspirone, zetia, mobic, remeron, MVI with minerals, omeprazole, oxycodone, K+, breztri. Gabrielle Pineda reports meeting with outpatient RD twice and feels that since her diet is limited as she reports being a "pickey eater" in addition to a reduced appetite. Reviewed what outpatient RD suggested as well as general heart healthy eating and pulmonary MNT. She does not eat until the afternoon/evening at a 3-5pm meal when she gets hungry: pork chop and some macaroni and cheese. She does not like most vegetables, but she enjoys salads (onions and cucumbers and cheese and some egg, pineapple, romaine and iceburg lettuce) and potatoes. She reports not eating a large meal at this time. Gabrielle Pineda reports that she has not tried the reommendations given by outpatient RD as she does not like eating when she is not hunrgy, but is open to trying the premier protein - suggested since she is not eating other meals/snacks at this time she could have ensure complete which would provide the same amount of protein for 350 calories which is closer to a meal replacement. Encouraged that if she begins to eat more meals/snacks that premier protein would be a good option. Encouraged mechanical eating while she is still not feeling hunger cues and suggested snacks with fiber, protein, and fat such as fruit with yogurt or peanut butter. Suggested that her appetite could be lower due to COPD, anemia, and/or prolonged time eating a smaller amount. Gabrielle Pineda also reports her iron is still low and does not know what her MD suggested - encouraged her to reach out to her MD to see what they recommend.  Intervention Plan   Intervention Prescribe, educate and  counsel regarding individualized specific dietary modifications aiming towards targeted core components such as weight, hypertension, lipid management, diabetes, heart failure and other comorbidities.;Nutrition handout(s) given to patient.    Expected Outcomes Short Term Goal: Understand basic principles of dietary content, such as calories, fat, sodium, cholesterol and nutrients.;Short Term Goal: A plan has been developed with personal nutrition goals set during dietitian appointment.;Long Term Goal: Adherence to prescribed nutrition plan.             Nutrition Assessments:  MEDIFICTS Score Key: ?70 Need to make dietary changes  40-70 Heart Healthy Diet ? 40 Therapeutic Level Cholesterol Diet  Flowsheet Row Pulmonary Rehab from [facility]27/[facility] Cardiac [facility] Pulmonary Rehab  Picture Your Plate Total Score on Admission 40      Picture Your Plate Scores: <23 Unhealthy dietary pattern with much room for improvement. 41-50 Dietary pattern unlikely to meet recommendations for good health and room for improvement. 51-60 More healthful dietary pattern, with some room for improvement.  >60 Healthy dietary pattern, although there may be some specific behaviors that could be improved.   Nutrition Goals Re-Evaluation:   Nutrition Goals Discharge (Final Nutrition Goals Re-Evaluation):   Psychosocial: Target Goals: Acknowledge presence or absence of significant depression and/or stress, maximize coping skills, provide positive support system. Participant is able to verbalize types and ability to use techniques and skills needed for reducing stress and depression.   Education: Stress, Anxiety, and Depression - Group verbal and visual presentation to define topics covered.  Reviews how body is impacted by stress, anxiety, and depression.  Also discusses healthy ways to reduce stress and to treat/manage anxiety and depression.  Written material given at graduation.   Education: Sleep  Hygiene -Provides group verbal and written instruction about how sleep can affect your health.  Define sleep hygiene, discuss sleep cycles and impact of sleep habits. Review good sleep hygiene tips.    Initial Review & Psychosocial Screening:  Initial Psych Review & Screening - 07/16/22 0912       Initial Review   Current issues with Current Depression;History of Depression;Current Psychotropic Meds;Current Sleep Concerns;Current Stress Concerns    Source of Stress Concerns Chronic Illness    Comments She has always had some form of depression and takes Buspar for it. Gabrielle Pineda feels  depressed at times but states it does not necessarily stem from something.      Family Dynamics   Good Support System? Yes    Comments She can look to her two sisters for support that live in town.      Barriers   Psychosocial barriers to participate in program The patient should benefit from training in stress management and relaxation.      Screening Interventions   Interventions To provide support and resources with identified psychosocial needs;Encouraged to exercise;Provide feedback about the scores to participant    Expected Outcomes Short Term goal: Utilizing psychosocial counselor, staff and physician to assist with identification of specific Stressors or current issues interfering with healing process. Setting desired goal for each stressor or current issue identified.;Long Term Goal: Stressors or current issues are controlled or eliminated.;Short Term goal: Identification and review with participant of any Quality of Life or Depression concerns found by scoring the questionnaire.;Long Term goal: The participant improves quality of Life and PHQ9 Scores as seen by post scores and/or verbalization of changes             Quality of Life Scores:  Scores of 19 and below usually indicate a poorer quality of life in these areas.  A difference of  2-3 points is a clinically meaningful difference.  A  difference of 2-3 points in the total score of the Quality of Life Index has been associated with significant improvement in overall quality of life, self-image, physical symptoms, and general health in studies assessing change in quality of life.  PHQ-9: Review Flowsheet  More data exists      07/27/2022 05/13/2022 04/22/2022 04/21/2022 04/07/2022  Depression screen PHQ 2/9  Decreased Interest 0  0 0 0  Down, Depressed, Hopeless 0  0 0 0  PHQ - 2 Score 0  0 0 0  Altered sleeping 0  - 1 -  Tired, decreased energy 0  - 1 -  Change in appetite 0  - 0 -  Feeling bad or failure about yourself  0  - 0 -  Trouble concentrating 0  - 0 -  Moving slowly or fidgety/restless 0  - 0 -  Suicidal thoughts 0  - 0 -  PHQ-9 Score 0  - 2 -  Difficult doing work/chores Not difficult at all  - Not difficult at all -    Details       Information is confidential and restricted. Go to Review Flowsheets to unlock data.        Interpretation of Total Score  Total Score Depression Severity:  1-4 = Minimal depression, 5-9 = Mild depression, 10-14 = Moderate depression, 15-19 = Moderately severe depression, 20-27 = Severe depression   Psychosocial Evaluation and Intervention:  Psychosocial Evaluation - 07/16/22 0917       Psychosocial Evaluation & Interventions   Interventions Stress management education;Relaxation education;Encouraged to exercise with the program and follow exercise prescription    Comments She has always had some form of depression and takes Buspar for it. Gabrielle Pineda feels depressed at times but states it does not necessarily stem from something.She can look to her two sisters for support that live in town.    Expected Outcomes Short: Start [facility] to help with mood. Long: Maintain a healthy mental state.    Continue Psychosocial Services  Follow up required by staff             Psychosocial Re-Evaluation:   Psychosocial Discharge (Final Psychosocial  Re-Evaluation):   Education: Education Goals: Education classes will be provided on a weekly basis, covering required topics. Participant will state understanding/return demonstration of topics presented.  Learning Barriers/Preferences:  Learning Barriers/Preferences - 07/16/22 0911       Learning Barriers/Preferences   Learning Barriers None    Learning Preferences None             General Pulmonary Education Topics:  Infection Prevention: - Provides verbal and written material to individual with discussion of infection control including proper hand washing and proper equipment cleaning during exercise session. Flowsheet Row Pulmonary Rehab from 07/27/2022 in [facility] Cardiac and Pulmonary Rehab  Date 07/16/22  Educator [facility]  Instruction Review Code 1- Verbalizes Understanding       Falls Prevention: - Provides verbal and written material to individual with discussion of falls prevention and safety. Flowsheet Row Pulmonary Rehab from 07/27/2022 in [facility] Cardiac and Pulmonary Rehab  Date 07/16/22  Educator [facility]  Instruction Review Code 1- Verbalizes Understanding       Chronic Lung Disease Review: - Group verbal instruction with posters, models, PowerPoint presentations and videos,  to review new updates, new respiratory medications, new advancements  in procedures and treatments. Providing information on websites and "800" numbers for continued self-education. Includes information about supplement oxygen, available portable oxygen systems, continuous and intermittent flow rates, oxygen safety, concentrators, and Medicare reimbursement for oxygen. Explanation of Pulmonary Drugs, including class, frequency, complications, importance of spacers, rinsing mouth after steroid MDI's, and proper cleaning methods for nebulizers. Review of basic lung anatomy and physiology related to function, structure, and complications of lung disease. Review of risk factors. Discussion about methods for  diagnosing sleep apnea and types of masks and machines for OSA. Includes a review of the use of types of environmental controls: home humidity, furnaces, filters, dust mite/pet prevention, HEPA vacuums. Discussion about weather changes, air quality and the benefits of nasal washing. Instruction on Warning signs, infection symptoms, calling MD promptly, preventive modes, and value of vaccinations. Review of effective airway clearance, coughing and/or vibration techniques. Emphasizing that all should Create an Action Plan. Written material given at graduation. Flowsheet Row Pulmonary Rehab from 07/27/2022 in [facility] Pulmonary Rehab  Education need identified 07/27/22       AED/CPR: - Group verbal and written instruction with the use of models to demonstrate the basic use of the AED with the basic ABC's of resuscitation.    Anatomy and Cardiac Procedures: - Group verbal and visual presentation and models provide information about basic cardiac anatomy and function. Reviews the testing methods done to diagnose heart disease and the outcomes of the test results. Describes the treatment choices: Medical Management, Angioplasty, or Coronary Bypass Surgery for treating various heart conditions including Myocardial Infarction, Angina, Valve Disease, and Cardiac Arrhythmias.  Written material given at graduation.   Medication Safety: - Group verbal and visual instruction to review commonly prescribed medications for heart and lung disease. Reviews the medication, class of the drug, and side effects. Includes the steps to properly store meds and maintain the prescription regimen.  Written material given at graduation.   Other: -Provides group and verbal instruction on various topics (see comments)   Knowledge Questionnaire Score:  Knowledge Questionnaire Score - 07/27/22 1432       Knowledge Questionnaire Score   Pre Score 14/18              Core Components/Risk Factors/Patient  Goals at Admission:  Personal Goals and Risk Factors at Admission - 07/27/22 1433       Core Components/Risk Factors/Patient Goals on Admission    Weight Management Yes;Weight Loss    Intervention Weight Management: Develop a combined nutrition and exercise program designed to reach desired caloric intake, while maintaining appropriate intake of nutrient and fiber, sodium and fats, and appropriate energy expenditure required for the weight goal.;Weight Management: Provide education and appropriate resources to help participant work on and attain dietary goals.;Weight Management/Obesity: Establish reasonable short term and long term weight goals.    Admit Weight 175 lb 14.4 oz (79.8 kg)    Goal Weight: Short Term 170 lb (77.1 kg)    Goal Weight: Long Term 165 lb (74.8 kg)    Expected Outcomes Long Term: Adherence to nutrition and physical activity/exercise program aimed toward attainment of established weight goal;Short Term: Continue to assess and modify interventions until short term weight is achieved;Weight Loss: Understanding of general recommendations for a balanced deficit meal plan, which promotes 1-2 lb weight loss per week and includes a negative energy balance of [phone] kcal/d;Understanding of distribution of calorie intake throughout the day with the consumption of 4-5 meals/snacks;Understanding recommendations for meals to include 15-35% energy as  protein, 25-35% energy from fat, 35-60% energy from carbohydrates, less than '200mg'$  of dietary cholesterol, 20-35 gm of total fiber daily    Improve shortness of breath with ADL's Yes    Intervention Provide education, individualized exercise plan and daily activity instruction to help decrease symptoms of SOB with activities of daily living.    Expected Outcomes Short Term: Improve cardiorespiratory fitness to achieve a reduction of symptoms when performing ADLs;Long Term: Be able to perform more ADLs without symptoms or delay the onset of  symptoms    Increase knowledge of respiratory medications and ability to use respiratory devices properly  Yes    Intervention Provide education and demonstration as needed of appropriate use of medications, inhalers, and oxygen therapy.    Expected Outcomes Short Term: Achieves understanding of medications use. Understands that oxygen is a medication prescribed by physician. Demonstrates appropriate use of inhaler and oxygen therapy.;Long Term: Maintain appropriate use of medications, inhalers, and oxygen therapy.    Hypertension Yes    Intervention Provide education on lifestyle modifcations including regular physical activity/exercise, weight management, moderate sodium restriction and increased consumption of fresh fruit, vegetables, and low fat dairy, alcohol moderation, and smoking cessation.;Monitor prescription use compliance.    Expected Outcomes Short Term: Continued assessment and intervention until BP is < 140/42m HG in hypertensive participants. < 130/870mHG in hypertensive participants with diabetes, heart failure or chronic kidney disease.;Long Term: Maintenance of blood pressure at goal levels.    Lipids Yes    Intervention Provide education and support for participant on nutrition & aerobic/resistive exercise along with prescribed medications to achieve LDL '70mg'$ , HDL >'40mg'$ .    Expected Outcomes Short Term: Participant states understanding of desired cholesterol values and is compliant with medications prescribed. Participant is following exercise prescription and nutrition guidelines.;Long Term: Cholesterol controlled with medications as prescribed, with individualized exercise RX and with personalized nutrition plan. Value goals: LDL < '70mg'$ , HDL > 40 mg.             Education:Diabetes - Individual verbal and written instruction to review signs/symptoms of diabetes, desired ranges of glucose level fasting, after meals and with exercise. Acknowledge that pre and post exercise  glucose checks will be done for 3 sessions at entry of program.   Know Your Numbers and Heart Failure: - Group verbal and visual instruction to discuss disease risk factors for cardiac and pulmonary disease and treatment options.  Reviews associated critical values for Overweight/Obesity, Hypertension, Cholesterol, and Diabetes.  Discusses basics of heart failure: signs/symptoms and treatments.  Introduces Heart Failure Zone chart for action plan for heart failure.  Written material given at graduation.   Core Components/Risk Factors/Patient Goals Review:    Core Components/Risk Factors/Patient Goals at Discharge (Final Review):    ITP Comments:  ITP Comments     Row Name 07/16/22 0909 07/27/22 1425         ITP Comments Virtual Visit completed. Patient informed on EP and RD appointment and 6 Minute walk test. Patient also informed of patient health questionnaires on My Chart. Patient Verbalizes understanding. [facility]. Completed 6MWT and gym orientation. Initial ITP created and sent for review to Dr. FaZetta BillsMedical Director.               Comments: Initial ITP

## 2022-07-27 NOTE — Patient Instructions (Signed)
Patient Instructions  Patient Details  Name: Gabrielle Pineda MRN: [id number] Date of Birth: March 03, 1956 Referring Provider:  Tyler Pita, MD  Below are your personal goals for exercise, nutrition, and risk factors. Our goal is to help you stay on track towards obtaining and maintaining these goals. We will be discussing your progress on these goals with you throughout the program.  Initial Exercise Prescription:  Initial Exercise Prescription - 07/27/22 1400       Date of Initial Exercise RX and Referring Provider   Date 07/27/22    Referring Provider [name] MD      Oxygen   Maintain Oxygen Saturation 88% or higher      Recumbant Bike   Level 2    RPM 50    Watts 30    Minutes 15    METs 3      NuStep   Level 3    SPM 80    Minutes 15    METs 3      REL-XR   Level 2    Speed 50    Minutes 15    METs 3      Track   Laps 27    Minutes 15    METs 2.47      Prescription Details   Frequency (times per week) 2    Duration Progress to 30 minutes of continuous aerobic without signs/symptoms of physical distress      Intensity   THRR 40-80% of Max Heartrate 104-138    Ratings of Perceived Exertion 11-13    Perceived Dyspnea 0-4      Progression   Progression Continue to progress workloads to maintain intensity without signs/symptoms of physical distress.      Resistance Training   Training Prescription Yes    Weight 3 lb    Reps 10-15             Exercise Goals: Frequency: Be able to perform aerobic exercise two to three times per week in program working toward 2-5 days per week of home exercise.  Intensity: Work with a perceived exertion of 11 (fairly light) - 15 (hard) while following your exercise prescription.  We will make changes to your prescription with you as you progress through the program.   Duration: Be able to do 30 to 45 minutes of continuous aerobic exercise in addition to a 5 minute warm-up and a 5 minute cool-down  routine.   Nutrition Goals: Your personal nutrition goals will be established when you do your nutrition analysis with the dietician.  The following are general nutrition guidelines to follow: Cholesterol < '200mg'$ /day Sodium < '1500mg'$ /day Fiber: Women over 50 yrs - 21 grams per day  Personal Goals:  Personal Goals and Risk Factors at Admission - 07/27/22 1433       Core Components/Risk Factors/Patient Goals on Admission    Weight Management Yes;Weight Loss    Intervention Weight Management: Develop a combined nutrition and exercise program designed to reach desired caloric intake, while maintaining appropriate intake of nutrient and fiber, sodium and fats, and appropriate energy expenditure required for the weight goal.;Weight Management: Provide education and appropriate resources to help participant work on and attain dietary goals.;Weight Management/Obesity: Establish reasonable short term and long term weight goals.    Admit Weight 175 lb 14.4 oz (79.8 kg)    Goal Weight: Short Term 170 lb (77.1 kg)    Goal Weight: Long Term 165 lb (74.8 kg)    Expected Outcomes  Long Term: Adherence to nutrition and physical activity/exercise program aimed toward attainment of established weight goal;Short Term: Continue to assess and modify interventions until short term weight is achieved;Weight Loss: Understanding of general recommendations for a balanced deficit meal plan, which promotes 1-2 lb weight loss per week and includes a negative energy balance of [phone] kcal/d;Understanding of distribution of calorie intake throughout the day with the consumption of 4-5 meals/snacks;Understanding recommendations for meals to include 15-35% energy as protein, 25-35% energy from fat, 35-60% energy from carbohydrates, less than '200mg'$  of dietary cholesterol, 20-35 gm of total fiber daily    Improve shortness of breath with ADL's Yes    Intervention Provide education, individualized exercise plan and daily activity  instruction to help decrease symptoms of SOB with activities of daily living.    Expected Outcomes Short Term: Improve cardiorespiratory fitness to achieve a reduction of symptoms when performing ADLs;Long Term: Be able to perform more ADLs without symptoms or delay the onset of symptoms    Increase knowledge of respiratory medications and ability to use respiratory devices properly  Yes    Intervention Provide education and demonstration as needed of appropriate use of medications, inhalers, and oxygen therapy.    Expected Outcomes Short Term: Achieves understanding of medications use. Understands that oxygen is a medication prescribed by physician. Demonstrates appropriate use of inhaler and oxygen therapy.;Long Term: Maintain appropriate use of medications, inhalers, and oxygen therapy.    Hypertension Yes    Intervention Provide education on lifestyle modifcations including regular physical activity/exercise, weight management, moderate sodium restriction and increased consumption of fresh fruit, vegetables, and low fat dairy, alcohol moderation, and smoking cessation.;Monitor prescription use compliance.    Expected Outcomes Short Term: Continued assessment and intervention until BP is < 140/64m HG in hypertensive participants. < 130/852mHG in hypertensive participants with diabetes, heart failure or chronic kidney disease.;Long Term: Maintenance of blood pressure at goal levels.    Lipids Yes    Intervention Provide education and support for participant on nutrition & aerobic/resistive exercise along with prescribed medications to achieve LDL '70mg'$ , HDL >'40mg'$ .    Expected Outcomes Short Term: Participant states understanding of desired cholesterol values and is compliant with medications prescribed. Participant is following exercise prescription and nutrition guidelines.;Long Term: Cholesterol controlled with medications as prescribed, with individualized exercise RX and with personalized nutrition  plan. Value goals: LDL < '70mg'$ , HDL > 40 mg.             Tobacco Use Initial Evaluation: Social History   Tobacco Use  Smoking Status Former   Packs/day: 2.00   Years: 40.00   Total pack years: 80.00   Types: Cigarettes   Quit date: 10/30/2020   Years since quitting: 1.7  Smokeless Tobacco Never    Exercise Goals and Review:  Exercise Goals     Row Name 07/27/22 1431             Exercise Goals   Increase Physical Activity Yes       Intervention Provide advice, education, support and counseling about physical activity/exercise needs.;Develop an individualized exercise prescription for aerobic and resistive training based on initial evaluation findings, risk stratification, comorbidities and participant's personal goals.       Expected Outcomes Long Term: Exercising regularly at least 3-5 days a week.;Short Term: Attend rehab on a regular basis to increase amount of physical activity.;Long Term: Add in home exercise to make exercise part of routine and to increase amount of physical activity.  Increase Strength and Stamina Yes       Intervention Provide advice, education, support and counseling about physical activity/exercise needs.;Develop an individualized exercise prescription for aerobic and resistive training based on initial evaluation findings, risk stratification, comorbidities and participant's personal goals.       Expected Outcomes Short Term: Increase workloads from initial exercise prescription for resistance, speed, and METs.;Short Term: Perform resistance training exercises routinely during rehab and add in resistance training at home;Long Term: Improve cardiorespiratory fitness, muscular endurance and strength as measured by increased METs and functional capacity (6MWT)       Able to understand and use rate of perceived exertion (RPE) scale Yes       Intervention Provide education and explanation on how to use RPE scale       Expected Outcomes Short Term:  Able to use RPE daily in rehab to express subjective intensity level;Long Term:  Able to use RPE to guide intensity level when exercising independently       Able to understand and use Dyspnea scale Yes       Intervention Provide education and explanation on how to use Dyspnea scale       Expected Outcomes Short Term: Able to use Dyspnea scale daily in rehab to express subjective sense of shortness of breath during exertion;Long Term: Able to use Dyspnea scale to guide intensity level when exercising independently       Knowledge and understanding of Target Heart Rate Range (THRR) Yes       Intervention Provide education and explanation of THRR including how the numbers were predicted and where they are located for reference       Expected Outcomes Short Term: Able to state/look up THRR;Long Term: Able to use THRR to govern intensity when exercising independently;Short Term: Able to use daily as guideline for intensity in rehab       Able to check pulse independently Yes       Intervention Provide education and demonstration on how to check pulse in carotid and radial arteries.;Review the importance of being able to check your own pulse for safety during independent exercise       Expected Outcomes Short Term: Able to explain why pulse checking is important during independent exercise;Long Term: Able to check pulse independently and accurately       Understanding of Exercise Prescription Yes       Intervention Provide education, explanation, and written materials on patient's individual exercise prescription       Expected Outcomes Short Term: Able to explain program exercise prescription;Long Term: Able to explain home exercise prescription to exercise independently              Copy of goals given to participant.

## 2022-07-30 ENCOUNTER — Encounter: Payer: Self-pay | Admitting: Podiatry

## 2022-07-30 ENCOUNTER — Ambulatory Visit: Payer: Medicare Other | Admitting: Podiatry

## 2022-07-30 VITALS — BP 130/65 | HR 69

## 2022-07-30 DIAGNOSIS — B351 Tinea unguium: Secondary | ICD-10-CM

## 2022-07-30 DIAGNOSIS — L84 Corns and callosities: Secondary | ICD-10-CM

## 2022-07-30 DIAGNOSIS — M79675 Pain in left toe(s): Secondary | ICD-10-CM

## 2022-07-30 DIAGNOSIS — M2041 Other hammer toe(s) (acquired), right foot: Secondary | ICD-10-CM

## 2022-07-30 DIAGNOSIS — M79674 Pain in right toe(s): Secondary | ICD-10-CM | POA: Diagnosis not present

## 2022-07-30 DIAGNOSIS — G629 Polyneuropathy, unspecified: Secondary | ICD-10-CM

## 2022-07-30 DIAGNOSIS — M2042 Other hammer toe(s) (acquired), left foot: Secondary | ICD-10-CM

## 2022-07-30 NOTE — Progress Notes (Signed)
This patient presents to the office with chief complaint of long thick painful nails.  Patient says the nails are painful walking and wearing shoes.  This patient is unable to self treat.  This patient is unable to trim her nails since she is unable to reach her nails.   She presents to the office for preventative foot care services.  General Appearance  Alert, conversant and in no acute stress.  Vascular  Dorsalis pedis and posterior tibial  pulses are palpable  bilaterally.  Capillary return is within normal limits  bilaterally. Temperature is within normal limits  bilaterally.  Neurologic  Senn-Weinstein monofilament wire test within normal limits  bilaterally. Muscle power within normal limits bilaterally.  Nails Thick disfigured discolored nails with subungual debris  from hallux to fifth toes bilaterally. No evidence of bacterial infection or drainage bilaterally.  Orthopedic  No limitations of motion  feet .  No crepitus or effusions noted.  HAV  B/L.  Hammer toes 2-5  B/L.  Skin  normotropic skin with no porokeratosis noted bilaterally.  No signs of infections or ulcers noted.    Onychomycosis  Nails  B/L.  Pain in right toes  Pain in left toes    Debridement of nails both feet with nail nipper  followed trimming the nails with dremel tool.   RTC 3 months.   Gardiner Barefoot DPM

## 2022-08-04 ENCOUNTER — Encounter: Payer: Medicare Other | Admitting: *Deleted

## 2022-08-04 DIAGNOSIS — J449 Chronic obstructive pulmonary disease, unspecified: Secondary | ICD-10-CM

## 2022-08-04 NOTE — Progress Notes (Signed)
Daily Session Note  Patient Details  Name: Gabrielle Pineda MRN: [id number] Date of Birth: [birth year] Referring Provider:   Flowsheet Row Pulmonary Rehab from 07/27/2022 in [facility] Cardiac and Pulmonary Rehab  Referring Provider [name] MD       Encounter Date: 08/04/2022  Check In:  Session Check In - 08/04/22 0943       Check-In   Supervising physician immediately available to respond to emergencies See telemetry face sheet for immediately available ER MD    Location [facility]-Cardiac & Pulmonary Rehab    Staff Present Heath Lark, RN, BSN, CCRP;Jessica Echelon, MA, RCEP, CCRP, Bertram Gala, MS, ACSM CEP, Exercise Physiologist    Virtual Visit No    Medication changes reported     No    Fall or balance concerns reported    No    Warm-up and Cool-down Performed on first and last piece of equipment    Resistance Training Performed Yes    VAD Patient? No    PAD/SET Patient? No      Pain Assessment   Currently in Pain? No/denies                Social History   Tobacco Use  Smoking Status Former   Packs/day: 2.00   Years: 40.00   Total pack years: 80.00   Types: Cigarettes   Quit date: 10/30/2020   Years since quitting: 1.7  Smokeless Tobacco Never    Goals Met:  Proper associated with RPD/PD & O2 Sat Exercise tolerated well Personal goals reviewed No report of concerns or symptoms today  Goals Unmet:  Not Applicable  Comments: First full day of exercise!  Patient was oriented to gym and equipment including functions, settings, policies, and procedures.  Patient's individual exercise prescription and treatment plan were reviewed.  All starting workloads were established based on the results of the 6 minute walk test done at initial orientation visit.  The plan for exercise progression was also introduced and progression will be customized based on patient's performance and goals.    Dr. Emily Filbert is Medical Director for [facility].  Dr. Ottie Glazier is Medical Director for [facility] Pulmonary Rehabilitation.

## 2022-08-06 ENCOUNTER — Encounter: Payer: Medicare Other | Admitting: *Deleted

## 2022-08-06 DIAGNOSIS — J449 Chronic obstructive pulmonary disease, unspecified: Secondary | ICD-10-CM | POA: Diagnosis not present

## 2022-08-06 NOTE — Progress Notes (Signed)
Daily Session Note  Patient Details  Name: Gabrielle Pineda MRN: [id number] Date of Birth: 12-10-1955 Referring Provider:   Flowsheet Row Pulmonary Rehab from 07/27/2022 in [facility] Cardiac and Pulmonary Rehab  Referring Provider [name] MD       Encounter Date: 08/06/2022  Check In:  Session Check In - 08/06/22 0930       Check-In   Supervising physician immediately available to respond to emergencies See telemetry face sheet for immediately available ER MD    Location [facility]-Cardiac & Pulmonary Rehab    Staff Present Heath Lark, RN, BSN, CCRP;Joseph Hood, RCP,RRT,BSRT;Noah Tickle, [place], Exercise Physiologist    Virtual Visit No    Medication changes reported     No    Fall or balance concerns reported    No    Warm-up and Cool-down Performed on first and last piece of equipment    Resistance Training Performed Yes    VAD Patient? No    PAD/SET Patient? No      Pain Assessment   Currently in Pain? No/denies                Social History   Tobacco Use  Smoking Status Former   Packs/day: 2.00   Years: 40.00   Total pack years: 80.00   Types: Cigarettes   Quit date: 10/30/2020   Years since quitting: 1.7  Smokeless Tobacco Never    Goals Met:  Proper associated with RPD/PD & O2 Sat Independence with exercise equipment Exercise tolerated well No report of concerns or symptoms today  Goals Unmet:  Not Applicable  Comments: Pt able to follow exercise prescription today without complaint.  Will continue to monitor for progression.    Dr. Emily Filbert is Medical Director for [facility].  Dr. Ottie Glazier is Medical Director for [facility] Pulmonary Rehabilitation.

## 2022-08-11 ENCOUNTER — Encounter: Payer: Medicare Other | Admitting: *Deleted

## 2022-08-11 DIAGNOSIS — J449 Chronic obstructive pulmonary disease, unspecified: Secondary | ICD-10-CM | POA: Diagnosis not present

## 2022-08-11 NOTE — Progress Notes (Signed)
Daily Session Note  Patient Details  Name: Gabrielle Pineda MRN: [id number] Date of Birth: 09/18/1955 Referring Provider:   Flowsheet Row Pulmonary Rehab from 07/27/2022 in [facility] Cardiac and Pulmonary Rehab  Referring Provider [name] MD       Encounter Date: 08/11/2022  Check In:  Session Check In - 08/11/22 1010       Check-In   Supervising physician immediately available to respond to emergencies See telemetry face sheet for immediately available ER MD    Location [facility]-Cardiac & Pulmonary Rehab    Staff Present Heath Lark, RN, BSN, CCRP;Jessica [place], MA, RCEP, CCRP, CCET;Noah Tickle, BS, Exercise Physiologist    Virtual Visit No    Medication changes reported     No    Fall or balance concerns reported    No    Warm-up and Cool-down Performed on first and last piece of equipment    Resistance Training Performed Yes    VAD Patient? No    PAD/SET Patient? No      Pain Assessment   Currently in Pain? No/denies                Social History   Tobacco Use  Smoking Status Former   Packs/day: 2.00   Years: 40.00   Total pack years: 80.00   Types: Cigarettes   Quit date: 10/30/2020   Years since quitting: 1.7  Smokeless Tobacco Never    Goals Met:  Proper associated with RPD/PD & O2 Sat Independence with exercise equipment Exercise tolerated well No report of concerns or symptoms today  Goals Unmet:  Not Applicable  Comments: Pt able to follow exercise prescription today without complaint.  Will continue to monitor for progression.    Dr. Emily Filbert is Medical Director for [facility].  Dr. Ottie Glazier is Medical Director for [facility] Pulmonary Rehabilitation.

## 2022-08-12 ENCOUNTER — Ambulatory Visit: Payer: Self-pay

## 2022-08-12 ENCOUNTER — Encounter: Payer: Self-pay | Admitting: *Deleted

## 2022-08-12 ENCOUNTER — Telehealth: Payer: Medicare Other | Admitting: Physician Assistant

## 2022-08-12 DIAGNOSIS — J441 Chronic obstructive pulmonary disease with (acute) exacerbation: Secondary | ICD-10-CM | POA: Diagnosis not present

## 2022-08-12 DIAGNOSIS — J449 Chronic obstructive pulmonary disease, unspecified: Secondary | ICD-10-CM

## 2022-08-12 MED ORDER — PREDNISONE 20 MG PO TABS: 40.0000 mg | ORAL_TABLET | Freq: Every day | ORAL | 0 refills | Status: DC

## 2022-08-12 MED ORDER — DOXYCYCLINE HYCLATE 100 MG PO TABS: 100.0000 mg | ORAL_TABLET | Freq: Two times a day (BID) | ORAL | 0 refills | Status: DC

## 2022-08-12 MED ORDER — BENZONATATE 100 MG PO CAPS: 100.0000 mg | ORAL_CAPSULE | Freq: Three times a day (TID) | ORAL | 0 refills | Status: DC | PRN

## 2022-08-12 NOTE — Patient Instructions (Signed)
Gabrielle Pineda, thank you for joining Leeanne Rio, PA-C for today's virtual visit.  While this provider is not your primary care provider (PCP), if your PCP is located in our provider database this encounter information will be shared with them immediately following your visit.   [facility] account gives you access to today's visit and all your visits, tests, and labs performed at [facility] " click here if you don't have a [facility] account or go to [email or web address]  Consent: (Patient) Gabrielle Pineda provided verbal consent for this virtual visit at the beginning of the encounter.  Current Medications:  Current Outpatient Medications:    acyclovir ointment (ZOVIRAX) 5 %, SMARTSIG:Topical Every 3 Hours, Disp: , Rfl:    albuterol (PROVENTIL) (2.5 MG/3ML) 0.083% nebulizer solution, Take 3 mLs (2.5 mg total) by nebulization every 4 (four) hours as needed for wheezing or shortness of breath., Disp: 225 mL, Rfl: 2   albuterol (VENTOLIN HFA) 108 (90 Base) MCG/ACT inhaler, INHALE TWO PUFFS BY MOUTH INTO LUNGS every SIX hours AS NEEDED FOR WHEEZING AND/OR SHORTNESS OF BREATH, Disp: 8.5 g, Rfl: 2   ARIPiprazole (ABILIFY) 5 MG tablet, Take 1 tablet (5 mg total) by mouth in the morning., Disp: 90 tablet, Rfl: 1   atorvastatin (LIPITOR) 40 MG tablet, Take 1 tablet (40 mg total) by mouth every morning., Disp: 90 tablet, Rfl: 3   Budeson-Glycopyrrol-Formoterol (BREZTRI AEROSPHERE) 160-9-4.8 MCG/ACT AERO, Inhale 2 puffs into the lungs in the morning and at bedtime., Disp: 10.7 g, Rfl: 11   busPIRone (BUSPAR) 30 MG tablet, Take by mouth. Take 30 mg by mouth 3 (three) times a day, Disp: , Rfl:    carvedilol (COREG) 12.5 MG tablet, TAKE ONE TABLET BY MOUTH EVERY MORNING and TAKE ONE TABLET BY MOUTH EVERYDAY AT BEDTIME, Disp: 180 tablet, Rfl: 3   ezetimibe (ZETIA) 10 MG tablet, Take 1 tablet (10 mg total) by mouth every morning., Disp: 90 tablet, Rfl: 3    gabapentin (NEURONTIN) 400 MG capsule, Take 1 capsule (400 mg total) by mouth 2 (two) times daily., Disp: 60 capsule, Rfl: 5   leflunomide (ARAVA) 20 MG tablet, Take 20 mg by mouth at bedtime., Disp: , Rfl:    LINZESS 145 MCG CAPS capsule, Take 145 mcg by mouth daily before breakfast., Disp: , Rfl:    meloxicam (MOBIC) 15 MG tablet, Take 15 mg by mouth at bedtime., Disp: , Rfl:    mirtazapine (REMERON) 15 MG tablet, TAKE ONE TABLET BY MOUTH EVERYDAY AT BEDTIME, Disp: 90 tablet, Rfl: 0   montelukast (SINGULAIR) 10 MG tablet, Take 1 tablet (10 mg total) by mouth at bedtime., Disp: 30 tablet, Rfl: 11   Multiple Vitamin (MULTIVITAMIN WITH MINERALS) TABS tablet, Take 1 tablet by mouth daily., Disp: , Rfl:    Nebulizer MISC, Compressor nebulizer to use with nebulized medications as instructed, Disp: 1 each, Rfl: 0   omeprazole (PRILOSEC) 40 MG capsule, Take 1 capsule (40 mg total) by mouth 2 (two) times daily., Disp: 180 capsule, Rfl: 1   oxyCODONE-acetaminophen (PERCOCET) 5-325 MG tablet, Take 1 tablet by mouth every 8 (eight) hours as needed for severe pain. Must last 30 days., Disp: 90 tablet, Rfl: 0   [START ON 08/20/2022] oxyCODONE-acetaminophen (PERCOCET) 5-325 MG tablet, Take 1 tablet by mouth every 8 (eight) hours as needed for severe pain. Must last 30 days., Disp: 90 tablet, Rfl: 0   [START ON 09/19/2022] oxyCODONE-acetaminophen (PERCOCET) 5-325 MG tablet, Take 1 tablet by  mouth every 8 (eight) hours as needed for severe pain. Must last 30 days., Disp: 90 tablet, Rfl: 0   potassium chloride SA (KLOR-CON M) 20 MEQ tablet, Take 1 tablet (20 mEq total) by mouth daily with breakfast., Disp: 90 tablet, Rfl: 1   topiramate (TOPAMAX) 25 MG tablet, Take 1 tablet (25 mg total) by mouth 2 (two) times daily., Disp: 60 tablet, Rfl: 11   valACYclovir (VALTREX) 1000 MG tablet, TAKE TWO TABLETS BY MOUTH TWICE DAILY AS NEEDED FOR 5 DAYS FOR FEVER blisters, Disp: 30 tablet, Rfl: 1   venlafaxine XR (EFFEXOR-XR) 75 MG  24 hr capsule, Take 3 capsules (225 mg total) by mouth daily with breakfast. TAKE 3 CAPSULES BY MOUTH ONCE DAILY WITH BREAKFAST, Disp: 270 capsule, Rfl: 1   XIIDRA 5 % SOLN, Place 1 drop into both eyes daily., Disp: , Rfl:    Medications ordered in this encounter:  No orders of the defined types were placed in this encounter.    *If you need refills on other medications prior to your next appointment, please contact your pharmacy*  Follow-Up: Call back or seek an in-person evaluation if the symptoms worsen or if the condition fails to improve as anticipated.  [place] [phone]  Other Instructions Take antibiotic (Doxycycline) as directed.  Increase fluids.  Get plenty of rest. Use Mucinex for congestion. Use the prednisone as directed. Continue your regular COPD medications. Take a daily probiotic (I recommend Align or Culturelle, but even Activia Yogurt may be beneficial).  A humidifier placed in the bedroom may offer some relief for a dry, scratchy throat of nasal irritation.  Continue to check O2 levels at home -- if declining or any worsening symptoms at all despite treatment, you need an in-person evaluation ASAP.  Acute Bronchitis Bronchitis is when the airways that extend from the windpipe into the lungs get red, puffy, and painful (inflamed). Bronchitis often causes thick spit (mucus) to develop. This leads to a cough. A cough is the most common symptom of bronchitis. In acute bronchitis, the condition usually begins suddenly and goes away over time (usually in 2 weeks). Smoking, allergies, and asthma can make bronchitis worse. Repeated episodes of bronchitis may cause more lung problems.  HOME CARE Rest. Drink enough fluids to keep your pee (urine) clear or pale yellow (unless you need to limit fluids as told by your doctor). Only take over-the-counter or prescription medicines as told by your doctor. Avoid smoking and secondhand smoke. These can make  bronchitis worse. If you are a smoker, think about using nicotine gum or skin patches. Quitting smoking will help your lungs heal faster. Reduce the chance of getting bronchitis again by: Washing your hands often. Avoiding people with cold symptoms. Trying not to touch your hands to your mouth, nose, or eyes. Follow up with your doctor as told.  GET HELP IF: Your symptoms do not improve after 1 week of treatment. Symptoms include: Cough. Fever. Coughing up thick spit. Body aches. Chest congestion. Chills. Shortness of breath. Sore throat.  GET HELP RIGHT AWAY IF:  You have an increased fever. You have chills. You have severe shortness of breath. You have bloody thick spit (sputum). You throw up (vomit) often. You lose too much body fluid (dehydration). You have a severe headache. You faint.  MAKE SURE YOU:  Understand these instructions. Will watch your condition. Will get help right away if you are not doing well or get worse. Document Released: 01/20/2008 Document Revised: 04/05/2013  Document Reviewed: 01/24/2013 [facility] Patient Information 2015 [place], [place]. This information is not intended to replace advice given to you by your health care provider. Make sure you discuss any questions you have with your health care provider.    If you have been instructed to have an in-person evaluation today at a local Urgent Care facility, please use the link below. It will take you to a list of all of our available [facility], including address, phone number and hours of operation. Please do not delay care.  [facility]  If you or a family member do not have a primary care provider, use the link below to schedule a visit and establish care. When you choose a [employer] [facility] [employer] primary care physician or advanced practice provider, you gain a long-term partner in health. Find a Primary Care Provider  Learn more about 's in-office and virtual care  options: [facility]

## 2022-08-12 NOTE — Progress Notes (Signed)
Pulmonary Individual Treatment Plan  Patient Details  Name: Gabrielle Pineda MRN: [id number] Date of Birth: [birth year] Referring Provider:   Flowsheet Row Pulmonary Rehab from 07/27/2022 in [facility] Cardiac and Pulmonary Rehab  Referring Provider [name] MD       Initial Encounter Date:  Flowsheet Row Pulmonary Rehab from 07/27/2022 in [facility] Cardiac and Pulmonary Rehab  Date 07/27/22       Visit Diagnosis: Chronic obstructive pulmonary disease, unspecified COPD type ([place])  Patient's Home Medications on Admission:  Current Outpatient Medications:    acyclovir ointment (ZOVIRAX) 5 %, SMARTSIG:Topical Every 3 Hours, Disp: , Rfl:    albuterol (PROVENTIL) (2.5 MG/3ML) 0.083% nebulizer solution, Take 3 mLs (2.5 mg total) by nebulization every 4 (four) hours as needed for wheezing or shortness of breath., Disp: 225 mL, Rfl: 2   albuterol (VENTOLIN HFA) 108 (90 Base) MCG/ACT inhaler, INHALE TWO PUFFS BY MOUTH INTO LUNGS every SIX hours AS NEEDED FOR WHEEZING AND/OR SHORTNESS OF BREATH, Disp: 8.5 g, Rfl: 2   ARIPiprazole (ABILIFY) 5 MG tablet, Take 1 tablet (5 mg total) by mouth in the morning., Disp: 90 tablet, Rfl: 1   atorvastatin (LIPITOR) 40 MG tablet, Take 1 tablet (40 mg total) by mouth every morning., Disp: 90 tablet, Rfl: 3   Budeson-Glycopyrrol-Formoterol (BREZTRI AEROSPHERE) 160-9-4.8 MCG/ACT AERO, Inhale 2 puffs into the lungs in the morning and at bedtime., Disp: 10.7 g, Rfl: 11   busPIRone (BUSPAR) 30 MG tablet, Take by mouth. Take 30 mg by mouth 3 (three) times a day, Disp: , Rfl:    carvedilol (COREG) 12.5 MG tablet, TAKE ONE TABLET BY MOUTH EVERY MORNING and TAKE ONE TABLET BY MOUTH EVERYDAY AT BEDTIME, Disp: 180 tablet, Rfl: 3   ezetimibe (ZETIA) 10 MG tablet, Take 1 tablet (10 mg total) by mouth every morning., Disp: 90 tablet, Rfl: 3   gabapentin (NEURONTIN) 400 MG capsule, Take 1 capsule (400 mg total) by mouth 2 (two) times daily., Disp: 60 capsule, Rfl: 5    leflunomide (ARAVA) 20 MG tablet, Take 20 mg by mouth at bedtime., Disp: , Rfl:    LINZESS 145 MCG CAPS capsule, Take 145 mcg by mouth daily before breakfast., Disp: , Rfl:    meloxicam (MOBIC) 15 MG tablet, Take 15 mg by mouth at bedtime., Disp: , Rfl:    mirtazapine (REMERON) 15 MG tablet, TAKE ONE TABLET BY MOUTH EVERYDAY AT BEDTIME, Disp: 90 tablet, Rfl: 0   montelukast (SINGULAIR) 10 MG tablet, Take 1 tablet (10 mg total) by mouth at bedtime., Disp: 30 tablet, Rfl: 11   Multiple Vitamin (MULTIVITAMIN WITH MINERALS) TABS tablet, Take 1 tablet by mouth daily., Disp: , Rfl:    Nebulizer MISC, Compressor nebulizer to use with nebulized medications as instructed, Disp: 1 each, Rfl: 0   omeprazole (PRILOSEC) 40 MG capsule, Take 1 capsule (40 mg total) by mouth 2 (two) times daily., Disp: 180 capsule, Rfl: 1   oxyCODONE-acetaminophen (PERCOCET) 5-325 MG tablet, Take 1 tablet by mouth every 8 (eight) hours as needed for severe pain. Must last 30 days., Disp: 90 tablet, Rfl: 0   [START ON 08/20/2022] oxyCODONE-acetaminophen (PERCOCET) 5-325 MG tablet, Take 1 tablet by mouth every 8 (eight) hours as needed for severe pain. Must last 30 days., Disp: 90 tablet, Rfl: 0   [START ON 09/19/2022] oxyCODONE-acetaminophen (PERCOCET) 5-325 MG tablet, Take 1 tablet by mouth every 8 (eight) hours as needed for severe pain. Must last 30 days., Disp: 90 tablet, Rfl: 0   potassium  chloride SA (KLOR-CON M) 20 MEQ tablet, Take 1 tablet (20 mEq total) by mouth daily with breakfast., Disp: 90 tablet, Rfl: 1   topiramate (TOPAMAX) 25 MG tablet, Take 1 tablet (25 mg total) by mouth 2 (two) times daily., Disp: 60 tablet, Rfl: 11   valACYclovir (VALTREX) 1000 MG tablet, TAKE TWO TABLETS BY MOUTH TWICE DAILY AS NEEDED FOR 5 DAYS FOR FEVER blisters, Disp: 30 tablet, Rfl: 1   venlafaxine XR (EFFEXOR-XR) 75 MG 24 hr capsule, Take 3 capsules (225 mg total) by mouth daily with breakfast. TAKE 3 CAPSULES BY MOUTH ONCE DAILY WITH BREAKFAST,  Disp: 270 capsule, Rfl: 1   XIIDRA 5 % SOLN, Place 1 drop into both eyes daily., Disp: , Rfl:   Past Medical History: Past Medical History:  Diagnosis Date   Alcohol abuse    Anemia    Anxiety    Arthritis    Cervicalgia    Cirrhosis ([place]) 1994   COPD (chronic obstructive pulmonary disease) ([place])    Depression    Difficult intubation    has plates and screws in neck   Dyspnea    GERD (gastroesophageal reflux disease)    Headache    Heart murmur    on heard when pt is lying   Hypertension    Other and unspecified hyperlipidemia    Status post insertion of spinal cord stimulator    Tachycardia    d/t questionable anxiety happens every 5- 10 years, sees [facility]    Tobacco Use: Social History   Tobacco Use  Smoking Status Former   Packs/day: 2.00   Years: 40.00   Total pack years: 80.00   Types: Cigarettes   Quit date: 10/30/2020   Years since quitting: 1.7  Smokeless Tobacco Never    Labs: Review Flowsheet  More data exists      Latest Ref Rng & Units 04/17/2019 04/19/2020 03/17/2021 06/26/2021 11/17/2021  Labs for ITP Cardiac and Pulmonary Rehab  Cholestrol 100 - 199 mg/dL 158  172  - 120  153   LDL (calc) 0 - 99 mg/dL 80  98  - 50  79   HDL-C >39 mg/dL 54  56  - 39  40   Trlycerides 0 - 149 mg/dL 135  102  - 190  202   Hemoglobin A1c 4.8 - 5.6 % - - - 5.3  -  TCO2 22 - 32 mmol/L - - 21  - -     Pulmonary Assessment Scores:  Pulmonary Assessment Scores     Row Name 07/27/22 1435         ADL UCSD   ADL Phase Entry     SOB Score total 71     Rest 0     Walk 2     Stairs 5     Bath 2     Dress 3     Shop 4       CAT Score   CAT Score 17       mMRC Score   mMRC Score 2              UCSD: Self-administered rating of dyspnea associated with activities of daily living (ADLs) 6-point scale (0 = "not at all" to 5 = "maximal or unable to do because of breathlessness")  Scoring Scores range from 0 to 120.  Minimally important difference is  5 units  CAT: CAT can identify the health impairment of COPD patients and is better correlated with disease  progression.  CAT has a scoring range of zero to 40. The CAT score is classified into four groups of low (less than 10), medium (10 - 20), high (21-30) and very high (31-40) based on the impact level of disease on health status. A CAT score over 10 suggests significant symptoms.  A worsening CAT score could be explained by an exacerbation, poor medication adherence, poor inhaler technique, or progression of COPD or comorbid conditions.  CAT MCID is 2 points  mMRC: mMRC (Modified Medical Research Council) Dyspnea Scale is used to assess the degree of baseline functional disability in patients of respiratory disease due to dyspnea. No minimal important difference is established. A decrease in score of 1 point or greater is considered a positive change.   Pulmonary Function Assessment:  Pulmonary Function Assessment - 07/27/22 1433       Pulmonary Function Tests   FVC% 64 %   Collected 02/04/22   FEV1% 54 %    FEV1/FVC Ratio 64    RV% 144 %    DLCO% 51 %      Breath   Shortness of Breath Yes;Limiting activity;Fear of Shortness of Breath             Exercise Target Goals: Exercise Program Goal: Individual exercise prescription set using results from initial 6 min walk test and THRR while considering  patient's activity barriers and safety.   Exercise Prescription Goal: Initial exercise prescription builds to 30-45 minutes a day of aerobic activity, 2-3 days per week.  Home exercise guidelines will be given to patient during program as part of exercise prescription that the participant will acknowledge.  Education: Aerobic Exercise: - Group verbal and visual presentation on the components of exercise prescription. Introduces F.I.T.T principle from ACSM for exercise prescriptions.  Reviews F.I.T.T. principles of aerobic exercise including progression. Written material given at  graduation.   Education: Resistance Exercise: - Group verbal and visual presentation on the components of exercise prescription. Introduces F.I.T.T principle from ACSM for exercise prescriptions  Reviews F.I.T.T. principles of resistance exercise including progression. Written material given at graduation.    Education: Exercise & Equipment Safety: - Individual verbal instruction and demonstration of equipment use and safety with use of the equipment. Flowsheet Row Pulmonary Rehab from 08/06/2022 in [facility] Cardiac and Pulmonary Rehab  Date 07/16/22  Educator [facility]  Instruction Review Code 1- Verbalizes Understanding       Education: Exercise Physiology & General Exercise Guidelines: - Group verbal and written instruction with models to review the exercise physiology of the cardiovascular system and associated critical values. Provides general exercise guidelines with specific guidelines to those with heart or lung disease.    Education: Flexibility, Balance, Mind/Body Relaxation: - Group verbal and visual presentation with interactive activity on the components of exercise prescription. Introduces F.I.T.T principle from ACSM for exercise prescriptions. Reviews F.I.T.T. principles of flexibility and balance exercise training including progression. Also discusses the mind body connection.  Reviews various relaxation techniques to help reduce and manage stress (i.e. Deep breathing, progressive muscle relaxation, and visualization). Balance handout provided to take home. Written material given at graduation.   Activity Barriers & Risk Stratification:   6 Minute Walk:  6 Minute Walk     Row Name 07/27/22 1425         6 Minute Walk   Phase Initial     Distance 1035 feet     Walk Time 6 minutes     # of Rest Breaks 0  MPH 1.96     METS 3.24     RPE 14     Perceived Dyspnea  2     VO2 Peak 11.33     Symptoms Yes (comment)     Comments Hip pain 6/10     Resting HR 70 bpm      Resting BP 126/70     Resting Oxygen Saturation  94 %     Exercise Oxygen Saturation  during 6 min walk 92 %     Max Ex. HR 110 bpm     Max Ex. BP 184/74     2 Minute Post BP 154/72       Interval HR   1 Minute HR 96     2 Minute HR 100     3 Minute HR 99     4 Minute HR 97     5 Minute HR 103     6 Minute HR 110     2 Minute Post HR 77     Interval Heart Rate? Yes       Interval Oxygen   Interval Oxygen? Yes     Baseline Oxygen Saturation % 94 %     1 Minute Oxygen Saturation % 93 %     1 Minute Liters of Oxygen 0 L  Room Air     2 Minute Oxygen Saturation % 92 %     2 Minute Liters of Oxygen 0 L     3 Minute Oxygen Saturation % 93 %     3 Minute Liters of Oxygen 0 L     4 Minute Oxygen Saturation % 93 %     4 Minute Liters of Oxygen 0 L     5 Minute Oxygen Saturation % 93 %     5 Minute Liters of Oxygen 0 L     6 Minute Oxygen Saturation % 93 %     6 Minute Liters of Oxygen 0 L     2 Minute Post Oxygen Saturation % 94 %     2 Minute Post Liters of Oxygen 0 L             Oxygen Initial Assessment:  Oxygen Initial Assessment - 07/16/22 0910       Home Oxygen   Home Oxygen Device Home Concentrator    Sleep Oxygen Prescription Continuous    Liters per minute 2    Home Exercise Oxygen Prescription None    Home Resting Oxygen Prescription None    Compliance with Home Oxygen Use Yes      Initial 6 min Walk   Oxygen Used None      Program Oxygen Prescription   Program Oxygen Prescription None      Intervention   Short Term Goals To learn and exhibit compliance with exercise, home and travel O2 prescription;To learn and understand importance of maintaining oxygen saturations>88%;To learn and demonstrate proper pursed lip breathing techniques or other breathing techniques. ;To learn and understand importance of monitoring SPO2 with pulse oximeter and demonstrate accurate use of the pulse oximeter.;To learn and demonstrate proper use of respiratory medications     Long  Term Goals Exhibits compliance with exercise, home  and travel O2 prescription;Maintenance of O2 saturations>88%;Compliance with respiratory medication;Demonstrates proper use of MDI's;Exhibits proper breathing techniques, such as pursed lip breathing or other method taught during program session;Verbalizes importance of monitoring SPO2 with pulse oximeter and return demonstration  Oxygen Re-Evaluation:  Oxygen Re-Evaluation     Row Name 08/04/22 0953             Program Oxygen Prescription   Program Oxygen Prescription Continuous       Liters per minute 2       Comments sleep only         Home Oxygen   Home Oxygen Device Home Concentrator       Sleep Oxygen Prescription Continuous       Liters per minute 2       Home Exercise Oxygen Prescription None       Home Resting Oxygen Prescription None       Compliance with Home Oxygen Use Yes         Goals/Expected Outcomes   Short Term Goals To learn and demonstrate proper pursed lip breathing techniques or other breathing techniques.        Long  Term Goals Exhibits proper breathing techniques, such as pursed lip breathing or other method taught during program session       Comments Reviewed PLB technique with pt.  Talked about how it works and it's importance in maintaining their exercise saturations.       Goals/Expected Outcomes Short: Become more profiecient at using PLB. Long: Become independent at using PLB.                Oxygen Discharge (Final Oxygen Re-Evaluation):  Oxygen Re-Evaluation - 08/04/22 0953       Program Oxygen Prescription   Program Oxygen Prescription Continuous    Liters per minute 2    Comments sleep only      Home Oxygen   Home Oxygen Device Home Concentrator    Sleep Oxygen Prescription Continuous    Liters per minute 2    Home Exercise Oxygen Prescription None    Home Resting Oxygen Prescription None    Compliance with Home Oxygen Use Yes      Goals/Expected Outcomes    Short Term Goals To learn and demonstrate proper pursed lip breathing techniques or other breathing techniques.     Long  Term Goals Exhibits proper breathing techniques, such as pursed lip breathing or other method taught during program session    Comments Reviewed PLB technique with pt.  Talked about how it works and it's importance in maintaining their exercise saturations.    Goals/Expected Outcomes Short: Become more profiecient at using PLB. Long: Become independent at using PLB.             Initial Exercise Prescription:  Initial Exercise Prescription - 07/27/22 1400       Date of Initial Exercise RX and Referring Provider   Date 07/27/22    Referring Provider [name] MD      Oxygen   Maintain Oxygen Saturation 88% or higher      Recumbant Bike   Level 2    RPM 50    Watts 30    Minutes 15    METs 3      NuStep   Level 3    SPM 80    Minutes 15    METs 3      REL-XR   Level 2    Speed 50    Minutes 15    METs 3      Track   Laps 27    Minutes 15    METs 2.47      Prescription Details   Frequency (times per week)  2    Duration Progress to 30 minutes of continuous aerobic without signs/symptoms of physical distress      Intensity   THRR 40-80% of Max Heartrate 104-138    Ratings of Perceived Exertion 11-13    Perceived Dyspnea 0-4      Progression   Progression Continue to progress workloads to maintain intensity without signs/symptoms of physical distress.      Resistance Training   Training Prescription Yes    Weight 3 lb    Reps 10-15             Perform Capillary Blood Glucose checks as needed.  Exercise Prescription Changes:   Exercise Prescription Changes     Row Name 07/27/22 1400             Response to Exercise   Blood Pressure (Admit) 126/70       Blood Pressure (Exercise) 184/74       Blood Pressure (Exit) 128/70       Heart Rate (Admit) 70 bpm       Heart Rate (Exercise) 110 bpm       Heart Rate (Exit)  77 bpm       Oxygen Saturation (Admit) 94 %       Oxygen Saturation (Exercise) 92 %       Oxygen Saturation (Exit) 93 %       Rating of Perceived Exertion (Exercise) 14       Perceived Dyspnea (Exercise) 2       Symptoms hip pain 6/10       Comments walk test resutls                Exercise Comments:   Exercise Comments     Row Name 08/04/22 0953           Exercise Comments First full day of exercise!  Patient was oriented to gym and equipment including functions, settings, policies, and procedures.  Patient's individual exercise prescription and treatment plan were reviewed.  All starting workloads were established based on the results of the 6 minute walk test done at initial orientation visit.  The plan for exercise progression was also introduced and progression will be customized based on patient's performance and goals.                Exercise Goals and Review:   Exercise Goals     Row Name 07/27/22 1431             Exercise Goals   Increase Physical Activity Yes       Intervention Provide advice, education, support and counseling about physical activity/exercise needs.;Develop an individualized exercise prescription for aerobic and resistive training based on initial evaluation findings, risk stratification, comorbidities and participant's personal goals.       Expected Outcomes Long Term: Exercising regularly at least 3-5 days a week.;Short Term: Attend rehab on a regular basis to increase amount of physical activity.;Long Term: Add in home exercise to make exercise part of routine and to increase amount of physical activity.       Increase Strength and Stamina Yes       Intervention Provide advice, education, support and counseling about physical activity/exercise needs.;Develop an individualized exercise prescription for aerobic and resistive training based on initial evaluation findings, risk stratification, comorbidities and participant's personal goals.        Expected Outcomes Short Term: Increase workloads from initial exercise prescription for resistance, speed, and METs.;Short Term: Perform resistance training exercises  routinely during rehab and add in resistance training at home;Long Term: Improve cardiorespiratory fitness, muscular endurance and strength as measured by increased METs and functional capacity (6MWT)       Able to understand and use rate of perceived exertion (RPE) scale Yes       Intervention Provide education and explanation on how to use RPE scale       Expected Outcomes Short Term: Able to use RPE daily in rehab to express subjective intensity level;Long Term:  Able to use RPE to guide intensity level when exercising independently       Able to understand and use Dyspnea scale Yes       Intervention Provide education and explanation on how to use Dyspnea scale       Expected Outcomes Short Term: Able to use Dyspnea scale daily in rehab to express subjective sense of shortness of breath during exertion;Long Term: Able to use Dyspnea scale to guide intensity level when exercising independently       Knowledge and understanding of Target Heart Rate Range (THRR) Yes       Intervention Provide education and explanation of THRR including how the numbers were predicted and where they are located for reference       Expected Outcomes Short Term: Able to state/look up THRR;Long Term: Able to use THRR to govern intensity when exercising independently;Short Term: Able to use daily as guideline for intensity in rehab       Able to check pulse independently Yes       Intervention Provide education and demonstration on how to check pulse in carotid and radial arteries.;Review the importance of being able to check your own pulse for safety during independent exercise       Expected Outcomes Short Term: Able to explain why pulse checking is important during independent exercise;Long Term: Able to check pulse independently and accurately        Understanding of Exercise Prescription Yes       Intervention Provide education, explanation, and written materials on patient's individual exercise prescription       Expected Outcomes Short Term: Able to explain program exercise prescription;Long Term: Able to explain home exercise prescription to exercise independently                Exercise Goals Re-Evaluation :  Exercise Goals Re-Evaluation     Row Name 08/04/22 0956             Exercise Goal Re-Evaluation   Exercise Goals Review Able to understand and use rate of perceived exertion (RPE) scale;Able to understand and use Dyspnea scale;Knowledge and understanding of Target Heart Rate Range (THRR);Understanding of Exercise Prescription       Comments Reviewed RPE  and dyspnea scale, THR and program prescription with pt today.  Pt voiced understanding and was given a copy of goals to take home.       Expected Outcomes Short: Use RPE daily to regulate intensity. Long: Follow program prescription in THR.                Discharge Exercise Prescription (Final Exercise Prescription Changes):  Exercise Prescription Changes - 07/27/22 1400       Response to Exercise   Blood Pressure (Admit) 126/70    Blood Pressure (Exercise) 184/74    Blood Pressure (Exit) 128/70    Heart Rate (Admit) 70 bpm    Heart Rate (Exercise) 110 bpm    Heart Rate (Exit) 77 bpm  Oxygen Saturation (Admit) 94 %    Oxygen Saturation (Exercise) 92 %    Oxygen Saturation (Exit) 93 %    Rating of Perceived Exertion (Exercise) 14    Perceived Dyspnea (Exercise) 2    Symptoms hip pain 6/10    Comments walk test resutls             Nutrition:  Target Goals: Understanding of nutrition guidelines, daily intake of sodium '1500mg'$ , cholesterol '200mg'$ , calories 30% from fat and 7% or less from saturated fats, daily to have 5 or more servings of fruits and vegetables.  Education: All About Nutrition: -Group instruction provided by verbal, written  material, interactive activities, discussions, models, and posters to present general guidelines for heart healthy nutrition including fat, fiber, MyPlate, the role of sodium in heart healthy nutrition, utilization of the nutrition label, and utilization of this knowledge for meal planning. Follow up email sent as well. Written material given at graduation.   Biometrics:  Pre Biometrics - 07/27/22 1431       Pre Biometrics   Height '5\' 7"'$  (1.702 m)    Weight 175 lb 14.4 oz (79.8 kg)    Waist Circumference 40.5 inches    Hip Circumference 41 inches    Waist to Hip Ratio 0.99 %    BMI (Calculated) 27.54    Single Leg Stand 6.7 seconds              Nutrition Therapy Plan and Nutrition Goals:  Nutrition Therapy & Goals - 07/27/22 0938       Nutrition Therapy   Diet Heart healthy, low Na, pulmonary MNT    Drug/Food Interactions Statins/Certain Fruits    Protein (specify units) 85-95g    Fiber 25 grams    Whole Grain Foods 3 servings    Saturated Fats 12 max. grams    Fruits and Vegetables 8 servings/day    Sodium 2 grams      Personal Nutrition Goals   Nutrition Goal ST: review paperwork, try eating small melas/snacks, try ensure complete LT: meet protein and energy needs, eat a variety of whole foods    Comments 66 y.o. F admitted to pulmonary rehab for COPD. PMHx includes HTN, HLD, GERD, former tobacco use, former alcohol abuse, macrocytic anemia. Relevant medications includes lipitor, buspirone, zetia, mobic, remeron, MVI with minerals, omeprazole, oxycodone, K+, breztri. Gloria reports meeting with outpatient RD twice and feels that since her diet is limited as she reports being a "pickey eater" in addition to a reduced appetite. Reviewed what outpatient RD suggested as well as general heart healthy eating and pulmonary MNT. She does not eat until the afternoon/evening at a 3-5pm meal when she gets hungry: pork chop and some macaroni and cheese. She does not like most vegetables,  but she enjoys salads (onions and cucumbers and cheese and some egg, pineapple, romaine and iceburg lettuce) and potatoes. She reports not eating a large meal at this time. Rheya reports that she has not tried the reommendations given by outpatient RD as she does not like eating when she is not hunrgy, but is open to trying the premier protein - suggested since she is not eating other meals/snacks at this time she could have ensure complete which would provide the same amount of protein for 350 calories which is closer to a meal replacement. Encouraged that if she begins to eat more meals/snacks that premier protein would be a good option. Encouraged mechanical eating while she is still not feeling hunger cues and  suggested snacks with fiber, protein, and fat such as fruit with yogurt or peanut butter. Suggested that her appetite could be lower due to COPD, anemia, and/or prolonged time eating a smaller amount. Issabela also reports her iron is still low and does not know what her MD suggested - encouraged her to reach out to her MD to see what they recommend.      Intervention Plan   Intervention Prescribe, educate and counsel regarding individualized specific dietary modifications aiming towards targeted core components such as weight, hypertension, lipid management, diabetes, heart failure and other comorbidities.;Nutrition handout(s) given to patient.    Expected Outcomes Short Term Goal: Understand basic principles of dietary content, such as calories, fat, sodium, cholesterol and nutrients.;Short Term Goal: A plan has been developed with personal nutrition goals set during dietitian appointment.;Long Term Goal: Adherence to prescribed nutrition plan.             Nutrition Assessments:  MEDIFICTS Score Key: ?70 Need to make dietary changes  40-70 Heart Healthy Diet ? 40 Therapeutic Level Cholesterol Diet  Flowsheet Row Pulmonary Rehab from 07/27/2022 in [facility] Card[facility] and Pulmonary Re[facility]  Picture  Your Plate Total Score on Admission 40      Picture Your Plate Scores: <96 Unhealthy dietary pattern with much room for improvement. 41-50 Dietary pattern unlikely to meet recommendations for good health and room for improvement. 51-60 More healthful dietary pattern, with some room for improvement.  >60 Healthy dietary pattern, although there may be some specific behaviors that could be improved.   Nutrition Goals Re-Evaluation:   Nutrition Goals Discharge (Final Nutrition Goals Re-Evaluation):   Psychosocial: Target Goals: Acknowledge presence or absence of significant depression and/or stress, maximize coping skills, provide positive support system. Participant is able to verbalize types and ability to use techniques and skills needed for reducing stress and depression.   Education: Stress, Anxiety, and Depression - Group verbal and visual presentation to define topics covered.  Reviews how body is impacted by stress, anxiety, and depression.  Also discusses healthy ways to reduce stress and to treat/manage anxiety and depression.  Written material given at graduation.   Education: Sleep Hygiene -Provides group verbal and written instruction about how sleep can affect your health.  Define sleep hygiene, discuss sleep cycles and impact of sleep habits. Review good sleep hygiene tips.    Initial Review & Psychosocial Screening:  Initial Psych Review & Screening - 07/16/22 0912       Initial Review   Current issues with Current Depression;History of Depression;Current Psychotropic Meds;Current Sleep Concerns;Current Stress Concerns    Source of Stress Concerns Chronic Illness    Comments She has always had some form of depression and takes Buspar for it. Cynthie feels  depressed at times but states it does not necessarily stem from something.      Family Dynamics   Good Support System? Yes    Comments She can look to her two sisters for support that live in town.      Barriers    Psychosocial barriers to participate in program The patient should benefit from training in stress management and relaxation.      Screening Interventions   Interventions To provide support and resources with identified psychosocial needs;Encouraged to exercise;Provide feedback about the scores to participant    Expected Outcomes Short Term goal: Utilizing psychosocial counselor, staff and physician to assist with identification of specific Stressors or current issues interfering with healing process. Setting desired goal for each stressor or current  issue identified.;Long Term Goal: Stressors or current issues are controlled or eliminated.;Short Term goal: Identification and review with participant of any Quality of Life or Depression concerns found by scoring the questionnaire.;Long Term goal: The participant improves quality of Life and PHQ9 Scores as seen by post scores and/or verbalization of changes             Quality of Life Scores:  Scores of 19 and below usually indicate a poorer quality of life in these areas.  A difference of  2-3 points is a clinically meaningful difference.  A difference of 2-3 points in the total score of the Quality of Life Index has been associated with significant improvement in overall quality of life, self-image, physical symptoms, and general health in studies assessing change in quality of life.  PHQ-9: Review Flowsheet  More data exists      07/27/2022 05/13/2022 04/22/2022 04/21/2022 04/07/2022  Depression screen PHQ 2/9  Decreased Interest 0  0 0 0  Down, Depressed, Hopeless 0  0 0 0  PHQ - 2 Score 0  0 0 0  Altered sleeping 0  - 1 -  Tired, decreased energy 0  - 1 -  Change in appetite 0  - 0 -  Feeling bad or failure about yourself  0  - 0 -  Trouble concentrating 0  - 0 -  Moving slowly or fidgety/restless 0  - 0 -  Suicidal thoughts 0  - 0 -  PHQ-9 Score 0  - 2 -  Difficult doing work/chores Not difficult at all  - Not difficult at all -     Details       Information is confidential and restricted. Go to Review Flowsheets to unlock data.        Interpretation of Total Score  Total Score Depression Severity:  1-4 = Minimal depression, 5-9 = Mild depression, 10-14 = Moderate depression, 15-19 = Moderately severe depression, 20-27 = Severe depression   Psychosocial Evaluation and Intervention:  Psychosocial Evaluation - 07/16/22 0917       Psychosocial Evaluation & Interventions   Interventions Stress management education;Relaxation education;Encouraged to exercise with the program and follow exercise prescription    Comments She has always had some form of depression and takes Buspar for it. Velena feels depressed at times but states it does not necessarily stem from something.She can look to her two sisters for support that live in town.    Expected Outcomes Short: Start [facility] to help with mood. Long: Maintain a healthy mental state.    Continue Psychosocial Services  Follow up required by staff             Psychosocial Re-Evaluation:   Psychosocial Discharge (Final Psychosocial Re-Evaluation):   Education: Education Goals: Education classes will be provided on a weekly basis, covering required topics. Participant will state understanding/return demonstration of topics presented.  Learning Barriers/Preferences:  Learning Barriers/Preferences - 07/16/22 0911       Learning Barriers/Preferences   Learning Barriers None    Learning Preferences None             General Pulmonary Education Topics:  Infection Prevention: - Provides verbal and written material to individual with discussion of infection control including proper hand washing and proper equipment cleaning during exercise session. Flowsheet Row Pulmonary Rehab from 08/06/2022 in [facility] and Pulmonary Rehab  Date 07/16/22  Educator [facility]  Instruction Review Code 1- Verbalizes Understanding       Falls Prevention: - Provides verbal  and written material to individual with discussion of falls prevention and safety. Flowsheet Row Pulmonary Rehab from 08/06/2022 in [facility] Card[facility] and Pulmonary Rehab  Date 07/16/22  Educator [facility]  Instruction Review Code 1- Verbalizes Understanding       Chronic Lung Disease Review: - Group verbal instruction with posters, models, PowerPoint presentations and videos,  to review new updates, new respiratory medications, new advancements in procedures and treatments. Providing information on websites and "800" numbers for continued self-education. Includes information about supplement oxygen, available portable oxygen systems, continuous and intermittent flow rates, oxygen safety, concentrators, and Medicare reimbursement for oxygen. Explanation of Pulmonary Drugs, including class, frequency, complications, importance of spacers, rinsing mouth after steroid MDI's, and proper cleaning methods for nebulizers. Review of basic lung anatomy and physiology related to function, structure, and complications of lung disease. Review of risk factors. Discussion about methods for diagnosing sleep apnea and types of masks and machines for OSA. Includes a review of the use of types of environmental controls: home humidity, furnaces, filters, dust mite/pet prevention, HEPA vacuums. Discussion about weather changes, air quality and the benefits of nasal washing. Instruction on Warning signs, infection symptoms, calling MD promptly, preventive modes, and value of vaccinations. Review of effective airway clearance, coughing and/or vibration techniques. Emphasizing that all should Create an Action Plan. Written material given at graduation. Flowsheet Row Pulmonary Rehab from 08/06/2022 in [facility] Cardiac and Pulmonary Rehab  Education need identified 07/27/22       AED/CPR: - Group verbal and written instruction with the use of models to demonstrate the basic use of the AED with the basic ABC's of  resuscitation.    Anatomy and Cardiac Procedures: - Group verbal and visual presentation and models provide information about basic cardiac anatomy and function. Reviews the testing methods done to diagnose heart disease and the outcomes of the test results. Describes the treatment choices: Medical Management, Angioplasty, or Coronary Bypass Surgery for treating various heart conditions including Myocardial Infarction, Angina, Valve Disease, and Cardiac Arrhythmias.  Written material given at graduation.   Medication Safety: - Group verbal and visual instruction to review commonly prescribed medications for heart and lung disease. Reviews the medication, class of the drug, and side effects. Includes the steps to properly store meds and maintain the prescription regimen.  Written material given at graduation.   Other: -Provides group and verbal instruction on various topics (see comments)   Knowledge Questionnaire Score:  Knowledge Questionnaire Score - 07/27/22 1432       Knowledge Questionnaire Score   Pre Score 14/18              Core Components/Risk Factors/Patient Goals at Admission:  Personal Goals and Risk Factors at Admission - 07/27/22 1433       Core Components/Risk Factors/Patient Goals on Admission    Weight Management Yes;Weight Loss    Intervention Weight Management: Develop a combined nutrition and exercise program designed to reach desired caloric intake, while maintaining appropriate intake of nutrient and fiber, sodium and fats, and appropriate energy expenditure required for the weight goal.;Weight Management: Provide education and appropriate resources to help participant work on and attain dietary goals.;Weight Management/Obesity: Establish reasonable short term and long term weight goals.    Admit Weight 175 lb 14.4 oz (79.8 kg)    Goal Weight: Short Term 170 lb (77.1 kg)    Goal Weight: Long Term 165 lb (74.8 kg)    Expected Outcomes Long Term: Adherence  to nutrition and physical activity/exercise program aimed toward  attainment of established weight goal;Short Term: Continue to assess and modify interventions until short term weight is achieved;Weight Loss: Understanding of general recommendations for a balanced deficit meal plan, which promotes 1-2 lb weight loss per week and includes a negative energy balance of [phone] kcal/d;Understanding of distribution of calorie intake throughout the day with the consumption of 4-5 meals/snacks;Understanding recommendations for meals to include 15-35% energy as protein, 25-35% energy from fat, 35-60% energy from carbohydrates, less than '200mg'$  of dietary cholesterol, 20-35 gm of total fiber daily    Improve shortness of breath with ADL's Yes    Intervention Provide education, individualized exercise plan and daily activity instruction to help decrease symptoms of SOB with activities of daily living.    Expected Outcomes Short Term: Improve cardiorespiratory fitness to achieve a reduction of symptoms when performing ADLs;Long Term: Be able to perform more ADLs without symptoms or delay the onset of symptoms    Increase knowledge of respiratory medications and ability to use respiratory devices properly  Yes    Intervention Provide education and demonstration as needed of appropriate use of medications, inhalers, and oxygen therapy.    Expected Outcomes Short Term: Achieves understanding of medications use. Understands that oxygen is a medication prescribed by physician. Demonstrates appropriate use of inhaler and oxygen therapy.;Long Term: Maintain appropriate use of medications, inhalers, and oxygen therapy.    Hypertension Yes    Intervention Provide education on lifestyle modifcations including regular physical activity/exercise, weight management, moderate sodium restriction and increased consumption of fresh fruit, vegetables, and low fat dairy, alcohol moderation, and smoking cessation.;Monitor prescription use  compliance.    Expected Outcomes Short Term: Continued assessment and intervention until BP is < 140/18m HG in hypertensive participants. < 130/839mHG in hypertensive participants with diabetes, heart failure or chronic kidney disease.;Long Term: Maintenance of blood pressure at goal levels.    Lipids Yes    Intervention Provide education and support for participant on nutrition & aerobic/resistive exercise along with prescribed medications to achieve LDL '70mg'$ , HDL >'40mg'$ .    Expected Outcomes Short Term: Participant states understanding of desired cholesterol values and is compliant with medications prescribed. Participant is following exercise prescription and nutrition guidelines.;Long Term: Cholesterol controlled with medications as prescribed, with individualized exercise RX and with personalized nutrition plan. Value goals: LDL < '70mg'$ , HDL > 40 mg.             Education:Diabetes - Individual verbal and written instruction to review signs/symptoms of diabetes, desired ranges of glucose level fasting, after meals and with exercise. Acknowledge that pre and post exercise glucose checks will be done for 3 sessions at entry of program.   Know Your Numbers and Heart Failure: - Group verbal and visual instruction to discuss disease risk factors for cardiac and pulmonary disease and treatment options.  Reviews associated critical values for Overweight/Obesity, Hypertension, Cholesterol, and Diabetes.  Discusses basics of heart failure: signs/symptoms and treatments.  Introduces Heart Failure Zone chart for action plan for heart failure.  Written material given at graduation. [facility]  Date 08/06/22  Educator SB  Instruction Review Code 1- Verbalizes Understanding       Core Components/Risk Factors/Patient Goals Review:    Core Components/Risk Factors/Patient Goals at Discharge (Final Review):    ITP Comments:   ITP Comments     Row Name 07/16/22 0909 07/27/22 1425 08/04/22 0953 08/12/22 0956     ITP Comments Virtual Visit completed. Patient informed on  EP and RD appointment and 6 Minute walk test. Patient also informed of patient health questionnaires on My Chart. Patient Verbalizes understanding. Visit diagnosis can be found in [facility] 07/07/2022. Completed 6MWT and gym orientation. Initial ITP created and sent for review to Dr. Zetta Bills, Medical Director. First full day of exercise!  Patient was oriented to gym and equipment including functions, settings, policies, and procedures.  Patient's individual exercise prescription and treatment plan were reviewed.  All starting workloads were established based on the results of the 6 minute walk test done at initial orientation visit.  The plan for exercise progression was also introduced and progression will be customized based on patient's performance and goals. 30 Day review completed. Medical Director ITP review done, changes made as directed, and signed approval by Medical Director.    new to program             Comments:

## 2022-08-12 NOTE — Progress Notes (Signed)
Virtual Visit Consent   Jamaica Z Appelbaum, you are scheduled for a virtual visit with a [facility] provider today. Just as with appointments in the office, your consent must be obtained to participate. Your consent will be active for this visit and any virtual visit you may have with one of our providers in the next 365 days. If you have a MyChart account, a copy of this consent can be sent to you electronically.  As this is a virtual visit, video technology does not allow for your provider to perform a traditional examination. This may limit your provider's ability to fully assess your condition. If your provider identifies any concerns that need to be evaluated in person or the need to arrange testing (such as labs, EKG, etc.), we will make arrangements to do so. Although advances in technology are sophisticated, we cannot ensure that it will always work on either your end or our end. If the connection with a video visit is poor, the visit may have to be switched to a telephone visit. With either a video or telephone visit, we are not always able to ensure that we have a secure connection.  By engaging in this virtual visit, you consent to the provision of healthcare and authorize for your insurance to be billed (if applicable) for the services provided during this visit. Depending on your insurance coverage, you may receive a charge related to this service.  I need to obtain your verbal consent now. Are you willing to proceed with your visit today? Gloris Z Wantz has provided verbal consent on 08/12/2022 for a virtual visit (video or telephone). Leeanne Rio, [place]  Date: 08/12/2022 2:58 PM  Virtual Visit via Video Note   I, Leeanne Rio, connected with  KERRIANNE JENG  ([id number], Jul 04, 1956) on 08/12/22 at  2:30 PM EST by a video-enabled telemedicine application and verified that I am speaking with the correct person using two identifiers.  Location: Patient: Virtual Visit  Location Patient: Home Provider: Virtual Visit Location Provider: Home Office   I discussed the limitations of evaluation and management by telemedicine and the availability of in person appointments. The patient expressed understanding and agreed to proceed.    History of Present Illness: Sibel Z Weng is a 66 y.o. who identifies as a female who was assigned female at birth, and is being seen today for few days of cough that was initially dry but starting to become productive, chest tightness increase in wheezing, low-grade fever and fatigue. Husband sick recently testing negative for COVID, flu, etc. Started on antibiotics. She did take home COVID test which was negative. Is keeping a check on her O2 averaging 93-94% on RA. Does wear home oxygen at night but denies any increase O2 needs.  HPI: HPI  Problems:  Patient Active Problem List   Diagnosis Date Noted   MDD (major depressive disorder), recurrent, in full remission ([facility]) 03/31/2022   Macrocytic anemia 02/20/2022   Overweight (BMI 25.0-29.9) 02/20/2022   Dry mouth 02/20/2022   COPD (chronic obstructive pulmonary disease) ([place]) 02/06/2022   Physical deconditioning 02/06/2022   Lung nodules 02/06/2022   Allergic rhinitis 02/06/2022   Clavi 01/22/2022   GAD (generalized anxiety disorder) 12/26/2021   Alcohol use disorder, moderate, in sustained remission ([place]) 12/26/2021   Need for vaccination against Streptococcus pneumoniae 11/17/2021   Sleep disorder 10/23/2021   Iron deficiency anemia 06/26/2021   Depression, major, single episode, moderate ([place]) 06/26/2021   Status post total hip replacement, right  04/15/2021   Chronic radicular lumbar pain 10/03/2020   Postlaminectomy syndrome, lumbar region 06/03/2020   Moderate episode of recurrent major depressive disorder ([place]) 12/04/2019   Spondylosis of cervical region without myelopathy or radiculopathy 09/21/2019   DDD (degenerative disc disease), cervical 09/21/2019    Cervicalgia 09/21/2019   Cervical fusion syndrome 09/21/2019   Chronic pain syndrome 09/21/2019   Spinal stenosis, lumbar region, with neurogenic claudication 08/15/2019   History of adenomatous polyp of colon 11/04/2017   Cervical radiculopathy 08/02/2017   Neuropathy 08/02/2017   Essential hypertension 07/28/2017   Closed compression fracture of L5 lumbar vertebra 07/07/2017   Sacral insufficiency fracture with routine healing 07/07/2017   COPD exacerbation ([place]) 04/10/2015   Family history of malignant neoplasm of pancreas 04/03/2015   Hypercholesteremia 04/03/2015   Disorder of iron metabolism 04/03/2015   Carpal tunnel syndrome 03/29/2015   Acid reflux 10/11/2014   Closed fracture of distal phalanx of thumb 08/15/2014   Arthritis, degenerative 01/30/2014   Arthritis or polyarthritis, rheumatoid ([place]) 01/30/2014   Paroxysmal supraventricular tachycardia 09/22/2010   B-complex deficiency 09/09/2007   CN (constipation) 06/26/2007   Clinical depression 06/26/2007   Cold sore 06/26/2007   H/O alcohol abuse 06/26/2007   Cannot sleep 06/26/2007   Localized osteoarthrosis, hand 06/26/2007   Menopausal symptom 06/26/2007    Allergies:  Allergies  Allergen Reactions   Sulfasalazine Other (See Comments)   Plaquenil [Hydroxychloroquine] Rash   Medications:  Current Outpatient Medications:    benzonatate (TESSALON) 100 MG capsule, Take 1 capsule (100 mg total) by mouth 3 (three) times daily as needed for cough., Disp: 30 capsule, Rfl: 0   doxycycline (VIBRA-TABS) 100 MG tablet, Take 1 tablet (100 mg total) by mouth 2 (two) times daily., Disp: 14 tablet, Rfl: 0   predniSONE (DELTASONE) 20 MG tablet, Take 2 tablets (40 mg total) by mouth daily with breakfast., Disp: 10 tablet, Rfl: 0   acyclovir ointment (ZOVIRAX) 5 %, SMARTSIG:Topical Every 3 Hours, Disp: , Rfl:    albuterol (PROVENTIL) (2.5 MG/3ML) 0.083% nebulizer solution, Take 3 mLs (2.5 mg total) by nebulization every 4 (four)  hours as needed for wheezing or shortness of breath., Disp: 225 mL, Rfl: 2   albuterol (VENTOLIN HFA) 108 (90 Base) MCG/ACT inhaler, INHALE TWO PUFFS BY MOUTH INTO LUNGS every SIX hours AS NEEDED FOR WHEEZING AND/OR SHORTNESS OF BREATH, Disp: 8.5 g, Rfl: 2   ARIPiprazole (ABILIFY) 5 MG tablet, Take 1 tablet (5 mg total) by mouth in the morning., Disp: 90 tablet, Rfl: 1   atorvastatin (LIPITOR) 40 MG tablet, Take 1 tablet (40 mg total) by mouth every morning., Disp: 90 tablet, Rfl: 3   Budeson-Glycopyrrol-Formoterol (BREZTRI AEROSPHERE) 160-9-4.8 MCG/ACT AERO, Inhale 2 puffs into the lungs in the morning and at bedtime., Disp: 10.7 g, Rfl: 11   busPIRone (BUSPAR) 30 MG tablet, Take by mouth. Take 30 mg by mouth 3 (three) times a day, Disp: , Rfl:    carvedilol (COREG) 12.5 MG tablet, TAKE ONE TABLET BY MOUTH EVERY MORNING and TAKE ONE TABLET BY MOUTH EVERYDAY AT BEDTIME, Disp: 180 tablet, Rfl: 3   ezetimibe (ZETIA) 10 MG tablet, Take 1 tablet (10 mg total) by mouth every morning., Disp: 90 tablet, Rfl: 3   gabapentin (NEURONTIN) 400 MG capsule, Take 1 capsule (400 mg total) by mouth 2 (two) times daily., Disp: 60 capsule, Rfl: 5   leflunomide (ARAVA) 20 MG tablet, Take 20 mg by mouth at bedtime., Disp: , Rfl:    LINZESS 145 MCG  CAPS capsule, Take 145 mcg by mouth daily before breakfast., Disp: , Rfl:    meloxicam (MOBIC) 15 MG tablet, Take 15 mg by mouth at bedtime., Disp: , Rfl:    mirtazapine (REMERON) 15 MG tablet, TAKE ONE TABLET BY MOUTH EVERYDAY AT BEDTIME, Disp: 90 tablet, Rfl: 0   montelukast (SINGULAIR) 10 MG tablet, Take 1 tablet (10 mg total) by mouth at bedtime., Disp: 30 tablet, Rfl: 11   Multiple Vitamin (MULTIVITAMIN WITH MINERALS) TABS tablet, Take 1 tablet by mouth daily., Disp: , Rfl:    Nebulizer MISC, Compressor nebulizer to use with nebulized medications as instructed, Disp: 1 each, Rfl: 0   omeprazole (PRILOSEC) 40 MG capsule, Take 1 capsule (40 mg total) by mouth 2 (two) times  daily., Disp: 180 capsule, Rfl: 1   oxyCODONE-acetaminophen (PERCOCET) 5-325 MG tablet, Take 1 tablet by mouth every 8 (eight) hours as needed for severe pain. Must last 30 days., Disp: 90 tablet, Rfl: 0   [START ON 08/20/2022] oxyCODONE-acetaminophen (PERCOCET) 5-325 MG tablet, Take 1 tablet by mouth every 8 (eight) hours as needed for severe pain. Must last 30 days., Disp: 90 tablet, Rfl: 0   [START ON 09/19/2022] oxyCODONE-acetaminophen (PERCOCET) 5-325 MG tablet, Take 1 tablet by mouth every 8 (eight) hours as needed for severe pain. Must last 30 days., Disp: 90 tablet, Rfl: 0   potassium chloride SA (KLOR-CON M) 20 MEQ tablet, Take 1 tablet (20 mEq total) by mouth daily with breakfast., Disp: 90 tablet, Rfl: 1   topiramate (TOPAMAX) 25 MG tablet, Take 1 tablet (25 mg total) by mouth 2 (two) times daily., Disp: 60 tablet, Rfl: 11   valACYclovir (VALTREX) 1000 MG tablet, TAKE TWO TABLETS BY MOUTH TWICE DAILY AS NEEDED FOR 5 DAYS FOR FEVER blisters, Disp: 30 tablet, Rfl: 1   venlafaxine XR (EFFEXOR-XR) 75 MG 24 hr capsule, Take 3 capsules (225 mg total) by mouth daily with breakfast. TAKE 3 CAPSULES BY MOUTH ONCE DAILY WITH BREAKFAST, Disp: 270 capsule, Rfl: 1   XIIDRA 5 % SOLN, Place 1 drop into both eyes daily., Disp: , Rfl:   Observations/Objective: Patient is well-developed, well-nourished in no acute distress.  Resting comfortably at home.  Head is normocephalic, atraumatic.  No labored breathing. Audible wheeze on expiration. Speech is clear and coherent with logical content.  Patient is alert and oriented at baseline.   Assessment and Plan: 1. COPD exacerbation (HCC) - benzonatate (TESSALON) 100 MG capsule; Take 1 capsule (100 mg total) by mouth 3 (three) times daily as needed for cough.  Dispense: 30 capsule; Refill: 0 - predniSONE (DELTASONE) 20 MG tablet; Take 2 tablets (40 mg total) by mouth daily with breakfast.  Dispense: 10 tablet; Refill: 0 - doxycycline (VIBRA-TABS) 100 MG tablet;  Take 1 tablet (100 mg total) by mouth 2 (two) times daily.  Dispense: 14 tablet; Refill: 0  COVID negative. Supportive measures and OTC medications reviewed. Start prednisone burst, tessalon and doxycycline. Continue O2 saturation monitoring. Very strict ER precautions reviewed.   Follow Up Instructions: I discussed the assessment and treatment plan with the patient. The patient was provided an opportunity to ask questions and all were answered. The patient agreed with the plan and demonstrated an understanding of the instructions.  A copy of instructions were sent to the patient via MyChart unless otherwise noted below.   The patient was advised to call back or seek an in-person evaluation if the symptoms worsen or if the condition fails to improve as anticipated.  Time:  I spent 10 minutes with the patient via telehealth technology discussing the above problems/concerns.    Leeanne Rio, PA-C

## 2022-08-12 NOTE — Telephone Encounter (Signed)
Chief Complaint: cough Symptoms: SOB, wheezing, fever, body aches, severe headache Frequency: Onset 2 days ago Pertinent Negatives: Patient denies chest pain,  Disposition: '[x]'$ ED /'[]'$ Urgent Care (no appt availability in office) / '[]'$ Appointment(In office/virtual)/ '[]'$  [facility]/ '[]'$ Home Care/ '[x]'$ Refused Recommended Disposition /'[]'$ [facility]/ '[]'$  Follow-up with PCP Additional Notes: COVID negative per home test. No available appointments in office. Audible wheezing so advised ED for evaluation, she asks if medication could be called in, advised she will need to be seen. She says she doesn't want to sit for hours in the ED, asked could I schedule the virtual in the office on Friday. I advised she could be evaluated today via virtual UC, she agreed, scheduled virtual UC for today.   Summary: URI/needs appt   Pt has bad cough, congestion, body aches, head ache, needs appt today..  please advise         Reason for Disposition  SEVERE coughing spells (e.g., whooping sound after coughing, vomiting after coughing)  Answer Assessment - Initial Assessment Questions 1. ONSET: "When did the cough begin?"      2 days ago 2. SEVERITY: "How bad is the cough today?"      Bad, getting into chest 3. SPUTUM: "Describe the color of your sputum" (none, dry cough; clear, white, yellow, green)     Yellow, some brown 4. HEMOPTYSIS: "Are you coughing up any blood?" If so ask: "How much?" (flecks, streaks, tablespoons, etc.)     No 5. DIFFICULTY BREATHING: "Are you having difficulty breathing?" If Yes, ask: "How bad is it?" (e.g., mild, moderate, severe)    - MILD: No SOB at rest, mild SOB with walking, speaks normally in sentences, can lie down, no retractions, pulse < 100.    - MODERATE: SOB at rest, SOB with minimal exertion and prefers to sit, cannot lie down flat, speaks in phrases, mild retractions, audible wheezing, pulse 100-120.    - SEVERE: Very SOB at rest, speaks in single words,  struggling to breathe, sitting hunched forward, retractions, pulse > 120      Moderate 6. FEVER: "Do you have a fever?" If Yes, ask: "What is your temperature, how was it measured, and when did it start?"     Yes, no thermometer to check 7. LUNG HISTORY: "Do you have any history of lung disease?"  (e.g., pulmonary embolus, asthma, emphysema)     COPD 8. OTHER SYMPTOMS: "Do you have any other symptoms?" (e.g., runny nose, wheezing, chest pain)       Wheezing, headache, runny nose, body aches  Protocols used: Cough - Acute Productive-A-AH

## 2022-08-13 ENCOUNTER — Telehealth: Payer: Self-pay

## 2022-08-13 ENCOUNTER — Telehealth (INDEPENDENT_AMBULATORY_CARE_PROVIDER_SITE_OTHER): Payer: Medicare Other | Admitting: Psychiatry

## 2022-08-13 ENCOUNTER — Encounter: Payer: Self-pay | Admitting: Psychiatry

## 2022-08-13 DIAGNOSIS — F3342 Major depressive disorder, recurrent, in full remission: Secondary | ICD-10-CM

## 2022-08-13 DIAGNOSIS — F1021 Alcohol dependence, in remission: Secondary | ICD-10-CM

## 2022-08-13 DIAGNOSIS — F411 Generalized anxiety disorder: Secondary | ICD-10-CM

## 2022-08-13 DIAGNOSIS — F5101 Primary insomnia: Secondary | ICD-10-CM

## 2022-08-13 NOTE — Progress Notes (Signed)
Virtual Visit via Video Note  I connected with Gabrielle Pineda on 08/13/22 at  9:30 AM EST by a video enabled telemedicine application and verified that I am speaking with the correct person using two identifiers.  Location Provider Location : [facility] Patient Location : Home  Participants: Patient , Provider   I discussed the limitations of evaluation and management by telemedicine and the availability of in person appointments. The patient expressed understanding and agreed to proceed.   I discussed the assessment and treatment plan with the patient. The patient was provided an opportunity to ask questions and all were answered. The patient agreed with the plan and demonstrated an understanding of the instructions.   The patient was advised to call back or seek an in-person evaluation if the symptoms worsen or if the condition fails to improve as anticipated.   [facility] MD OP Progress Note  08/13/2022 9:55 AM Gabrielle Pineda  MRN:  [id number]  Chief Complaint:  Chief Complaint  Patient presents with   Medication Refill   Follow-up   Depression   Anxiety   HPI: Gabrielle Pineda is a 66 year old Caucasian female on disability, lives in [place], has a history of primary insomnia, MDD, GAD, alcohol use disorder in remission, history of back pain, history of spinal cord stimulator, paroxysmal ventricular tachycardia, hypercholesterolemia, chronic pain syndrome, GERD was evaluated by telemedicine today.  Patient today reports she had a good Christmas holiday and birthday with her family.  She was able to spend time with a lot of her family members.  Patient reports she however got sick, has a cough as well as flulike symptoms.  She was able to talk to her provider yesterday and is currently on a course of prednisone and doxycycline.  Patient reports otherwise she has been doing fairly well on the current medication regimen with regards to her mood.  Denies any significant depression,  anxiety symptoms.  Patient denies any sleep problems except for the past couple of nights married sleep has been restless due to her cough.  Patient denies any suicidality, homicidality or perceptual disturbances.  Patient appeared to be alert, oriented to person place time and situation.  3 word memory immediate 3 out of 3, after 5 minutes 2 out of 3.  Attention and focus seem to be good-patient was able to do subtraction, serial sevens well.  Patient denies any other concerns today.  Visit Diagnosis:    ICD-10-CM   1. Primary insomnia  F51.01     2. MDD (major depressive disorder), recurrent, in full remission ([place])  F33.42     3. GAD (generalized anxiety disorder)  F41.1     4. Alcohol use disorder, moderate, in sustained remission ([place])  F10.21       Past Psychiatric History: Reviewed past psychiatric history from progress note on 12/26/2021.  Past trials of medications like sertraline, multiple other medications-does not remember all the names.  Past Medical History:  Past Medical History:  Diagnosis Date   Alcohol abuse    Anemia    Anxiety    Arthritis    Cervicalgia    Cirrhosis ([place]) 1994   COPD (chronic obstructive pulmonary disease) ([place] [place] [place])    Depression    Difficult intubation    has plates and screws in neck   Dyspnea    GERD (gastroesophageal reflux disease)    Headache    Heart murmur    on heard when pt is lying   Hypertension    Other and  unspecified hyperlipidemia    Status post insertion of spinal cord stimulator    Tachycardia    d/t questionable anxiety happens every 5- 10 years, sees [facility]    Past Surgical History:  Procedure Laterality Date   ANTERIOR CERVICAL DECOMP/DISCECTOMY FUSION  2012, 2015, 2018   x3   AUGMENTATION MAMMAPLASTY Bilateral [place] RELEASE  11/11/2011   Procedure: CARPAL TUNNEL RELEASE;  Surgeon: Floyce Stakes, MD;  Location: [facility];  Service: Neurosurgery;   Laterality: Right;  Right Median Nerve Decompression   CARPAL TUNNEL RELEASE  2013   COLONOSCOPY WITH PROPOFOL N/A 11/01/2017   Procedure: COLONOSCOPY WITH PROPOFOL;  Surgeon: Manya Silvas, MD;  Location: [facility] ENDOSCOPY;  Service: Endoscopy;  Laterality: N/A;   ESOPHAGOGASTRODUODENOSCOPY     LUMBAR LAMINECTOMY/DECOMPRESSION MICRODISCECTOMY N/A 08/15/2019   Procedure: Lumbar three to Sacral one Decompressive lumbar laminectomy;  Surgeon: Erline Levine, MD;  Location: [facility];  Service: Neurosurgery;  Laterality: N/A;   neck disc surgery     plates and screws in neck x 2   ROTATOR CUFF REPAIR  06/16/2016   right shoulder    ROTATOR CUFF REPAIR     SPINAL CORD STIMULATOR INSERTION N/A 03/17/2021   Procedure: THORACIC SPINAL CORD STIMULATOR (PERCUTANEOUS) & PULSE GENERATOR PLACEMENT;  Surgeon: [name], MD;  Location: [facility] ORS;  Service: Neurosurgery;  Laterality: N/A;   TONSILLECTOMY AND ADENOIDECTOMY  1973    TOTAL HIP ARTHROPLASTY Right 04/15/2021   Procedure: TOTAL HIP ARTHROPLASTY ANTERIOR APPROACH;  Surgeon: Hessie Knows, MD;  Location: [facility] ORS;  Service: Orthopedics;  Laterality: Right;    Family Psychiatric History: Reviewed family psychiatric history from progress note on 12/26/2021.  Family History:  Family History  Problem Relation Age of Onset   Alcohol abuse Mother    Bipolar disorder Mother    Suicidality Mother    Pancreatic cancer Father    Alcohol abuse Father    Drug abuse Sister    Alcohol abuse Sister    Depression Sister    Anesthesia problems Neg Hx    Breast cancer Neg Hx     Social History: Reviewed social history from progress note on 12/26/2021. Social History   Socioeconomic History   Marital status: Married    Spouse name: jerry   Number of children: 0   Years of education: Not on file   Highest education level: Bachelor's degree (e.g., BA, AB, BS)  Occupational History   Not on file  Tobacco Use   Smoking status: Former    Packs/day: 2.00     Years: 40.00    Total pack years: 80.00    Types: Cigarettes    Quit date: 10/30/2020    Years since quitting: 1.7   Smokeless tobacco: Never  Vaping Use   Vaping Use: Former   Devices: vape occasionally  Substance and Sexual Activity   Alcohol use: No    Comment: recovering alcoholic Since 9935    Drug use: No   Sexual activity: Not Currently  Other Topics Concern   Not on file  Social History Narrative   Married; full time; does not get regular exercise.    Social Determinants of Health   Financial Resource Strain: Low Risk  (11/03/2021)   Overall Financial Resource Strain (CARDIA)    Difficulty of Paying Living Expenses: Not very hard  Food Insecurity: No Food Insecurity (11/03/2021)   Hunger Vital Sign    Worried About  Running Out of Food in the Last Year: Never true    [place] in the Last Year: Never true  Transportation Needs: No Transportation Needs (11/03/2021)   PRAPARE - Hydrologist (Medical): No    Lack of Transportation (Non-Medical): No  Physical Activity: Inactive (11/03/2021)   Exercise Vital Sign    Days of Exercise per Week: 0 days    Minutes of Exercise per Session: 0 min  Stress: Stress Concern Present (11/03/2021)   [place]    Feeling of Stress : To some extent  Social Connections: Moderately Isolated (11/03/2021)   Social Connection and Isolation Panel [NHANES]    Frequency of Communication with Friends and Family: More than three times a week    Frequency of Social Gatherings with Friends and Family: More than three times a week    Attends Religious Services: Never    Marine scientist or Organizations: No    Attends Archivist Meetings: Never    Marital Status: Married    Allergies:  Allergies  Allergen Reactions   Sulfasalazine Other (See Comments)   Plaquenil [Hydroxychloroquine] Rash    Metabolic Disorder Labs: Lab  Results  Component Value Date   HGBA1C 5.3 06/26/2021   No results found for: "PROLACTIN" Lab Results  Component Value Date   CHOL 153 11/17/2021   TRIG 202 (H) 11/17/2021   HDL 40 11/17/2021   CHOLHDL 3.8 11/17/2021   LDLCALC 79 11/17/2021   LDLCALC 50 06/26/2021   Lab Results  Component Value Date   TSH 3.673 03/10/2022   TSH 2.170 11/17/2021    Therapeutic Level Labs: No results found for: "LITHIUM" No results found for: "VALPROATE" No results found for: "CBMZ"  Current Medications: Current Outpatient Medications  Medication Sig Dispense Refill   acyclovir ointment (ZOVIRAX) 5 % SMARTSIG:Topical Every 3 Hours     albuterol (PROVENTIL) (2.5 MG/3ML) 0.083% nebulizer solution Take 3 mLs (2.5 mg total) by nebulization every 4 (four) hours as needed for wheezing or shortness of breath. 225 mL 2   albuterol (VENTOLIN HFA) 108 (90 Base) MCG/ACT inhaler INHALE TWO PUFFS BY MOUTH INTO LUNGS every SIX hours AS NEEDED FOR WHEEZING AND/OR SHORTNESS OF BREATH 8.5 g 2   ARIPiprazole (ABILIFY) 5 MG tablet Take 1 tablet (5 mg total) by mouth in the morning. 90 tablet 1   atorvastatin (LIPITOR) 40 MG tablet Take 1 tablet (40 mg total) by mouth every morning. 90 tablet 3   benzonatate (TESSALON) 100 MG capsule Take 1 capsule (100 mg total) by mouth 3 (three) times daily as needed for cough. 30 capsule 0   Budeson-Glycopyrrol-Formoterol (BREZTRI AEROSPHERE) 160-9-4.8 MCG/ACT AERO Inhale 2 puffs into the lungs in the morning and at bedtime. 10.7 g 11   carvedilol (COREG) 12.5 MG tablet TAKE ONE TABLET BY MOUTH EVERY MORNING and TAKE ONE TABLET BY MOUTH EVERYDAY AT BEDTIME 180 tablet 3   doxycycline (VIBRA-TABS) 100 MG tablet Take 1 tablet (100 mg total) by mouth 2 (two) times daily. 14 tablet 0   ezetimibe (ZETIA) 10 MG tablet Take 1 tablet (10 mg total) by mouth every morning. 90 tablet 3   gabapentin (NEURONTIN) 400 MG capsule Take 1 capsule (400 mg total) by mouth 2 (two) times daily. 60  capsule 5   leflunomide (ARAVA) 20 MG tablet Take 20 mg by mouth at bedtime.     LINZESS 145 MCG CAPS capsule Take  145 mcg by mouth daily before breakfast.     meloxicam (MOBIC) 15 MG tablet Take 15 mg by mouth at bedtime.     methylPREDNISolone (MEDROL) 4 MG tablet Take by mouth.     mirtazapine (REMERON) 15 MG tablet TAKE ONE TABLET BY MOUTH EVERYDAY AT BEDTIME 90 tablet 0   montelukast (SINGULAIR) 10 MG tablet Take 1 tablet (10 mg total) by mouth at bedtime. 30 tablet 11   Multiple Vitamin (MULTIVITAMIN WITH MINERALS) TABS tablet Take 1 tablet by mouth daily.     Nebulizer MISC Compressor nebulizer to use with nebulized medications as instructed 1 each 0   omeprazole (PRILOSEC) 40 MG capsule Take 1 capsule (40 mg total) by mouth 2 (two) times daily. 180 capsule 1   oxyCODONE-acetaminophen (PERCOCET) 5-325 MG tablet Take 1 tablet by mouth every 8 (eight) hours as needed for severe pain. Must last 30 days. 90 tablet 0   [START ON 08/20/2022] oxyCODONE-acetaminophen (PERCOCET) 5-325 MG tablet Take 1 tablet by mouth every 8 (eight) hours as needed for severe pain. Must last 30 days. 90 tablet 0   [START ON 09/19/2022] oxyCODONE-acetaminophen (PERCOCET) 5-325 MG tablet Take 1 tablet by mouth every 8 (eight) hours as needed for severe pain. Must last 30 days. 90 tablet 0   potassium chloride SA (KLOR-CON M) 20 MEQ tablet Take 1 tablet (20 mEq total) by mouth daily with breakfast. 90 tablet 1   predniSONE (DELTASONE) 20 MG tablet Take 2 tablets (40 mg total) by mouth daily with breakfast. 10 tablet 0   SPIKEVAX syringe      topiramate (TOPAMAX) 25 MG tablet Take 1 tablet (25 mg total) by mouth 2 (two) times daily. 60 tablet 11   valACYclovir (VALTREX) 1000 MG tablet TAKE TWO TABLETS BY MOUTH TWICE DAILY AS NEEDED FOR 5 DAYS FOR FEVER blisters 30 tablet 1   venlafaxine XR (EFFEXOR-XR) 75 MG 24 hr capsule Take 3 capsules (225 mg total) by mouth daily with breakfast. TAKE 3 CAPSULES BY MOUTH ONCE DAILY WITH  BREAKFAST 270 capsule 1   XIIDRA 5 % SOLN Place 1 drop into both eyes daily.     No current facility-administered medications for this visit.     Musculoskeletal: Strength & Muscle Tone:  UTA Gait & Station:  Seated Patient leans: N/A  Psychiatric Specialty Exam: Review of Systems  Constitutional:  Positive for fatigue.  HENT:  Positive for congestion.   Psychiatric/Behavioral:  Positive for sleep disturbance (restless due to being sick- cough).   All other systems reviewed and are negative.   There were no vitals taken for this visit.There is no height or weight on file to calculate BMI.  General Appearance: Casual  Eye Contact:  Fair  Speech:  Normal Rate  Volume:  Normal  Mood:  Euthymic  Affect:  Restricted  Thought Process:  Goal Directed and Descriptions of Associations: Intact  Orientation:  Full (Time, Place, and Person)  Thought Content: Logical   Suicidal Thoughts:  No  Homicidal Thoughts:  No  Memory:  Immediate;   Fair Recent;   Fair Remote;   Fair  Judgement:  Fair  Insight:  Fair  Psychomotor Activity:  Normal  Concentration:  Concentration: Fair and Attention Span: Fair  Recall:  [employer] of Knowledge: Fair  Language: Fair  Akathisia:  No  Handed:  Right  AIMS (if indicated): done  Assets:  Communication Skills Desire for Improvement Housing Social Support  ADL's:  Intact  Cognition: WNL  Sleep:  restless due to being sick   Screenings: AIMS    Flowsheet Row Video Visit from 08/13/2022 in [facility] Office Visit from 05/13/2022 in [place] Office Visit from 03/31/2022 in [facility] Office Visit from 01/22/2022 in [place] [facility] Office Visit from 12/26/2021 in [facility] Total Score 0 0 0 0 [place] Visit from 05/13/2022 in [facility]  Office Visit from 03/31/2022 in [place] Office Visit from 01/22/2022 in [place] Office Visit from 12/26/2021 in [place] Visit from 12/04/2019 in [facility]  Total GAD-7 Score 0 2 0 0 9      PHQ2-9    Flowsheet Row Video Visit from 08/13/2022 in [facility] from 07/27/2022 in [facility] Office Visit from 05/13/2022 in [facility] from 04/22/2022 in [place] Office Visit from 04/21/2022 in [place]  PHQ-2 Total Score 0 0 0 0 0  PHQ-9 Total Score -- 0 1 -- 2      Flowsheet Row Video Visit from 08/13/2022 in [place] Office Visit from 05/13/2022 in [place] Office Visit from 03/31/2022 in [facility] No Risk Low Risk Low Risk        Assessment and Plan: Jenavee Laguardia Dobosz is a 66 year old Caucasian female, married, on disability, lives in [place], has a history of GAD, MDD, sleep problem, COPD, gastroesophageal reflux disease, hypertension, multiple other medical problems was evaluated by telemedicine today.  Patient currently with COPD exacerbation with recent initiation of steroids as well as doxycycline, otherwise stable on medications with regards to her mood.  Plan as noted below.  Plan MDD in full remission Venlafaxine 225 mg p.o. daily with breakfast Abilify 5 mg p.o. daily in the morning  GAD-stable Venlafaxine 225 mg p.o. daily  Primary insomnia-stable Mirtazapine 15 mg p.o. nightly Continue sleep hygiene techniques Sleep currently restless due to COPD exacerbation  Alcohol use disorder in remission Sober since 1994.  Follow-up in clinic in 4 months or sooner if needed. Collaboration of Care: Collaboration of  Care: Other patient encouraged to continue to follow-up with her primary provider for COPD exacerbation.  Patient/Guardian was advised Release of Information must be obtained prior to any record release in order to collaborate their care with an outside provider. Patient/Guardian was advised if they have not already done so to contact the registration department to sign all necessary forms in order for [place] to release information regarding their care.   Consent: Patient/Guardian gives verbal consent for treatment and assignment of benefits for services provided during this visit. Patient/Guardian expressed understanding and agreed to proceed.   This note was generated in part or whole with voice recognition software. Voice recognition is usually quite accurate but there are transcription errors that can and very often do occur. I apologize for any typographical errors that were not detected and corrected.      Ursula Alert, MD 08/13/2022, 9:55 AM

## 2022-08-13 NOTE — Progress Notes (Signed)
Chronic Care Management Pharmacy Assistant   Name: Gabrielle Pineda  MRN: [id number] DOB: 12/13/1955  Reason for Encounter: Medication Review/Medication Coordination for [facility]   Recent office visits:  08/12/2022 Raiford Noble, PA-C (PCP Video Visit) for COPD Exacerbation- Started: Benzonatate 100 mg 3 times daily prn, Doxycycline Hyclate 100 mg twice daily, Prednisone 40 mg daily with breakfast, No orders placed, No follow-up noted  Recent consult visits:  08/11/2022 Heath Lark, RN (Pulmonary Rehab) for COPD- No medication changes noted, No orders placed  08/06/2022 Heath Lark, RN (Pulmonary Rehab) for COPD- No medication changes noted, No orders placed  08/04/2022 Heath Lark, RN (Pulmonary Rehab) for COPD- No medication changes noted, No orders placed  07/30/2022 Gardiner Barefoot, DPM (Podiatry) for Foot care- No medication changes noted, No orders placed, patient to follow-up in 3 months  07/27/2022 Alberteen Sam, RN (Pulmonary Rehab) for COPD- No medication changes noted, No orders placed,   Hospital visits:  None in previous 6 months  Medications: Outpatient Encounter Medications as of 08/13/2022  Medication Sig   acyclovir ointment (ZOVIRAX) 5 % SMARTSIG:Topical Every 3 Hours   albuterol (PROVENTIL) (2.5 MG/3ML) 0.083% nebulizer solution Take 3 mLs (2.5 mg total) by nebulization every 4 (four) hours as needed for wheezing or shortness of breath.   albuterol (VENTOLIN HFA) 108 (90 Base) MCG/ACT inhaler INHALE TWO PUFFS BY MOUTH INTO LUNGS every SIX hours AS NEEDED FOR WHEEZING AND/OR SHORTNESS OF BREATH   ARIPiprazole (ABILIFY) 5 MG tablet Take 1 tablet (5 mg total) by mouth in the morning.   atorvastatin (LIPITOR) 40 MG tablet Take 1 tablet (40 mg total) by mouth every morning.   benzonatate (TESSALON) 100 MG capsule Take 1 capsule (100 mg total) by mouth 3 (three) times daily as needed for cough.   Budeson-Glycopyrrol-Formoterol (BREZTRI AEROSPHERE)  160-9-4.8 MCG/ACT AERO Inhale 2 puffs into the lungs in the morning and at bedtime.   carvedilol (COREG) 12.5 MG tablet TAKE ONE TABLET BY MOUTH EVERY MORNING and TAKE ONE TABLET BY MOUTH EVERYDAY AT BEDTIME   doxycycline (VIBRA-TABS) 100 MG tablet Take 1 tablet (100 mg total) by mouth 2 (two) times daily.   ezetimibe (ZETIA) 10 MG tablet Take 1 tablet (10 mg total) by mouth every morning.   gabapentin (NEURONTIN) 400 MG capsule Take 1 capsule (400 mg total) by mouth 2 (two) times daily.   leflunomide (ARAVA) 20 MG tablet Take 20 mg by mouth at bedtime.   LINZESS 145 MCG CAPS capsule Take 145 mcg by mouth daily before breakfast.   meloxicam (MOBIC) 15 MG tablet Take 15 mg by mouth at bedtime.   methylPREDNISolone (MEDROL) 4 MG tablet Take by mouth.   mirtazapine (REMERON) 15 MG tablet TAKE ONE TABLET BY MOUTH EVERYDAY AT BEDTIME   montelukast (SINGULAIR) 10 MG tablet Take 1 tablet (10 mg total) by mouth at bedtime.   Multiple Vitamin (MULTIVITAMIN WITH MINERALS) TABS tablet Take 1 tablet by mouth daily.   Nebulizer MISC Compressor nebulizer to use with nebulized medications as instructed   omeprazole (PRILOSEC) 40 MG capsule Take 1 capsule (40 mg total) by mouth 2 (two) times daily.   oxyCODONE-acetaminophen (PERCOCET) 5-325 MG tablet Take 1 tablet by mouth every 8 (eight) hours as needed for severe pain. Must last 30 days.   [START ON 08/20/2022] oxyCODONE-acetaminophen (PERCOCET) 5-325 MG tablet Take 1 tablet by mouth every 8 (eight) hours as needed for severe pain. Must last 30 days.   [START ON 09/19/2022] oxyCODONE-acetaminophen (PERCOCET) 5-325 MG  tablet Take 1 tablet by mouth every 8 (eight) hours as needed for severe pain. Must last 30 days.   potassium chloride SA (KLOR-CON M) 20 MEQ tablet Take 1 tablet (20 mEq total) by mouth daily with breakfast.   predniSONE (DELTASONE) 20 MG tablet Take 2 tablets (40 mg total) by mouth daily with breakfast.   SPIKEVAX syringe    topiramate (TOPAMAX) 25  MG tablet Take 1 tablet (25 mg total) by mouth 2 (two) times daily.   valACYclovir (VALTREX) 1000 MG tablet TAKE TWO TABLETS BY MOUTH TWICE DAILY AS NEEDED FOR 5 DAYS FOR FEVER blisters   venlafaxine XR (EFFEXOR-XR) 75 MG 24 hr capsule Take 3 capsules (225 mg total) by mouth daily with breakfast. TAKE 3 CAPSULES BY MOUTH ONCE DAILY WITH BREAKFAST   XIIDRA 5 % SOLN Place 1 drop into both eyes daily.   No facility-administered encounter medications on file as of 08/13/2022.   Care Gaps: Zoster Vaccine   Star Rating Drugs: Atorvastatin 40 mg last filled on 07/21/2022 for a 30-Day supply with [facility]   BP Readings from Last 3 Encounters:  07/30/22 130/65  07/07/22 122/80  06/30/22 135/82    Lab Results  Component Value Date   HGBA1C 5.3 06/26/2021    Patient obtains medications through Adherence Packaging  30 Days   Last adherence delivery included:  Meloxicam 15 mg tablet- Take one tablet by mouth once daily (Bedtime) Ezetimibe (Zetia) 10 mg tablet Take one tablet by mouth daily (Breakfast) Atorvastatin 40 mg tablet- Take one tablet by mouth daily (Breakfast) Carvedilol 12.5 mg tablet - Take on tablet by mouth twice daily (Breakfast, Bedtime) Mirtazapine 15 mg tablet-Take one tablet daily (Bedtime) Topiramate 25 mg tablet-Take one tablet by mouth twice daily (Breakfast, Bedtime) Gabapentin 400 mg tablet- Take one tablet by mouth twice daily (Breakfast, Bedtime) Linzess 145 mcg- Take one tablet by mouth daily (Before Breakfast) (Vials) Venlafaxine ER 75 mg capsule- Take three capsules by mouth daily (Breakfast) Omeprazole 40 mg- Take one capsule by mouth two times a day (Breakfast, Bedtime) Potassium 20 mEq - Take one capsule daily (Breakfast) Aripiprazole 5 mg tablet- Take one tablet by mouth daily (Breakfast) Leflunomide 20 mg tablet- Take one tablet by mouth once daily (Bedtime) Montelukast (Singulair) 10 mg tablet 1 tablet daily (Bedtime) Sulfasalazine 500 mg take  1000 mg by mouth twice daily (Breakfast, Bedtime) Albuterol 90 mcg 2 puffs every 6 hours prn    Patient declined the following medications for the month of December: Breztri Inhaler 160-9-4.8 mcg 2 puffs in twice daily-Per patient her Pulmonary provider gave her samples   Patient is due for next adherence delivery on: 08/25/2022 [facility].   Called patient and reviewed medications and coordinated delivery.   This delivery to include: Meloxicam 15 mg tablet- Take one tablet by mouth once daily (Bedtime) Ezetimibe (Zetia) 10 mg tablet Take one tablet by mouth daily (Breakfast) Atorvastatin 40 mg tablet- Take one tablet by mouth daily (Breakfast) Carvedilol 12.5 mg tablet - Take on tablet by mouth twice daily (Breakfast, Bedtime) Mirtazapine 15 mg tablet-Take one tablet daily (Bedtime) Topiramate 25 mg tablet-Take one tablet by mouth twice daily (Breakfast, Bedtime) Gabapentin 400 mg tablet- Take one tablet by mouth twice daily (Breakfast, Bedtime) Venlafaxine ER 75 mg capsule- Take three capsules by mouth daily (Breakfast) Omeprazole 40 mg- Take one capsule by mouth two times a day (Breakfast, Bedtime) Potassium 20 mEq - Take one capsule daily (Breakfast) Aripiprazole 5 mg tablet- Take one tablet by  mouth daily (Breakfast) Leflunomide 20 mg tablet- Take one tablet by mouth once daily (Bedtime) Montelukast (Singulair) 10 mg tablet 1 tablet daily (Bedtime)   Patient declined the following medications for the month of January 2024: Judithann Sauger Inhaler 160-9-4.8 mcg 2 puffs in twice daily- Patient receives this medication via [employer] patient assistance program Linzess 145 mcg- Take one tablet by mouth daily (Before Breakfast) (Vials) Albuterol 90 mcg 2 puffs every 6 hours prn - Patient has sufficient amount at this time  Patient is due for next adherence delivery on: 08/25/2022 [facility].  Patient needs refills for the month of January: No refills needed for this delivery  Confirmed delivery  date of 08/25/2022 [facility], advised patient that pharmacy will contact them the morning of delivery.  I spoke with the patient and she reports that she isn't feeling well. Patient is having shortness of breath, wheezing and a productive cough. Patient was seen by PCP yesterday and started on medications. Per patient her husband did pick up those medications on yesterday and she did start them. I informed patient that by next week if she still isn't doing better to give me a call.  Patient has a follow-up appointment with CPP on 10/19/2022 @ 1000.  Lynann Bologna, CPA/CMA Clinical Pharmacist Assistant Phone: [phone]

## 2022-08-16 DIAGNOSIS — M810 Age-related osteoporosis without current pathological fracture: Secondary | ICD-10-CM

## 2022-08-16 DIAGNOSIS — F32A Depression, unspecified: Secondary | ICD-10-CM

## 2022-08-16 DIAGNOSIS — E785 Hyperlipidemia, unspecified: Secondary | ICD-10-CM

## 2022-08-16 DIAGNOSIS — I10 Essential (primary) hypertension: Secondary | ICD-10-CM

## 2022-08-16 DIAGNOSIS — J449 Chronic obstructive pulmonary disease, unspecified: Secondary | ICD-10-CM

## 2022-08-18 NOTE — Progress Notes (Unsigned)
I,Gabrielle Pineda,acting as a Education administrator for [employer], PA-C.,have documented all relevant documentation on the behalf of Gabrielle Kirschner, PA-C,as directed by  Gabrielle Kirschner, PA-C while in the presence of Gabrielle Kirschner, PA-C.   Established patient visit   Patient: Gabrielle Pineda   DOB: 13-Feb-1956   67 y.o. Female  MRN: [id number] Visit Date: 08/19/2022  Today's healthcare provider: Mikey Kirschner, PA-C   Cc. COPD, O2 use   Subjective    HPI  Gabrielle Pineda is a 67 y/o female with severe COPD who presents today with concerns over needing O2 more at home, increased cough and SOB . She uses 2L at bedtime, for the last three days reports increased cough, SOB and had been using O2 at home 24/7. She reports doing a virtual visit last week ,was prescribed 40 mg prednisone daily x 5 days and doxycycline. She was improved but as soon as she was done with the course her symptoms returned.  She is consistent with Breztri 2 times daily, prn albuterol. She has been enrolled in pulmonary rehab but has cancelled the last three appointments due to feeling poorly.  Reports lowest O2 at home has been w/o supplemental O2 is 92%    Medications: Outpatient Medications Prior to Visit  Medication Sig   acyclovir ointment (ZOVIRAX) 5 % SMARTSIG:Topical Every 3 Hours   albuterol (PROVENTIL) (2.5 MG/3ML) 0.083% nebulizer solution Take 3 mLs (2.5 mg total) by nebulization every 4 (four) hours as needed for wheezing or shortness of breath.   albuterol (VENTOLIN HFA) 108 (90 Base) MCG/ACT inhaler INHALE TWO PUFFS BY MOUTH INTO LUNGS every SIX hours AS NEEDED FOR WHEEZING AND/OR SHORTNESS OF BREATH   ARIPiprazole (ABILIFY) 5 MG tablet Take 1 tablet (5 mg total) by mouth in the morning.   atorvastatin (LIPITOR) 40 MG tablet Take 1 tablet (40 mg total) by mouth every morning.   benzonatate (TESSALON) 100 MG capsule Take 1 capsule (100 mg total) by mouth 3 (three) times daily as needed for cough.    Budeson-Glycopyrrol-Formoterol (BREZTRI AEROSPHERE) 160-9-4.8 MCG/ACT AERO Inhale 2 puffs into the lungs in the morning and at bedtime.   carvedilol (COREG) 12.5 MG tablet TAKE ONE TABLET BY MOUTH EVERY MORNING and TAKE ONE TABLET BY MOUTH EVERYDAY AT BEDTIME   doxycycline (VIBRA-TABS) 100 MG tablet Take 1 tablet (100 mg total) by mouth 2 (two) times daily.   ezetimibe (ZETIA) 10 MG tablet Take 1 tablet (10 mg total) by mouth every morning.   gabapentin (NEURONTIN) 400 MG capsule Take 1 capsule (400 mg total) by mouth 2 (two) times daily.   leflunomide (ARAVA) 20 MG tablet Take 20 mg by mouth at bedtime.   LINZESS 145 MCG CAPS capsule Take 145 mcg by mouth daily before breakfast.   meloxicam (MOBIC) 15 MG tablet Take 15 mg by mouth at bedtime.   methylPREDNISolone (MEDROL) 4 MG tablet Take by mouth.   mirtazapine (REMERON) 15 MG tablet TAKE ONE TABLET BY MOUTH EVERYDAY AT BEDTIME   montelukast (SINGULAIR) 10 MG tablet Take 1 tablet (10 mg total) by mouth at bedtime.   Multiple Vitamin (MULTIVITAMIN WITH MINERALS) TABS tablet Take 1 tablet by mouth daily.   Nebulizer MISC Compressor nebulizer to use with nebulized medications as instructed   omeprazole (PRILOSEC) 40 MG capsule Take 1 capsule (40 mg total) by mouth 2 (two) times daily.   oxyCODONE-acetaminophen (PERCOCET) 5-325 MG tablet Take 1 tablet by mouth every 8 (eight) hours as needed for severe pain. Must last  30 days.   [START ON 08/20/2022] oxyCODONE-acetaminophen (PERCOCET) 5-325 MG tablet Take 1 tablet by mouth every 8 (eight) hours as needed for severe pain. Must last 30 days.   [START ON 09/19/2022] oxyCODONE-acetaminophen (PERCOCET) 5-325 MG tablet Take 1 tablet by mouth every 8 (eight) hours as needed for severe pain. Must last 30 days.   potassium chloride SA (KLOR-CON M) 20 MEQ tablet Take 1 tablet (20 mEq total) by mouth daily with breakfast.   predniSONE (DELTASONE) 20 MG tablet Take 2 tablets (40 mg total) by mouth daily with  breakfast.   SPIKEVAX syringe    topiramate (TOPAMAX) 25 MG tablet Take 1 tablet (25 mg total) by mouth 2 (two) times daily.   valACYclovir (VALTREX) 1000 MG tablet TAKE TWO TABLETS BY MOUTH TWICE DAILY AS NEEDED FOR 5 DAYS FOR FEVER blisters   venlafaxine XR (EFFEXOR-XR) 75 MG 24 hr capsule Take 3 capsules (225 mg total) by mouth daily with breakfast. TAKE 3 CAPSULES BY MOUTH ONCE DAILY WITH BREAKFAST   XIIDRA 5 % SOLN Place 1 drop into both eyes daily.   No facility-administered medications prior to visit.    Review of Systems  Constitutional:  Positive for fatigue. Negative for fever.  Respiratory:  Positive for cough, shortness of breath and wheezing.   Cardiovascular:  Negative for chest pain and leg swelling.  Gastrointestinal:  Negative for abdominal pain.  Neurological:  Positive for weakness. Negative for dizziness and headaches.      Objective    Blood pressure (!) 144/86, pulse 95, temperature 97.9 F (36.6 C), temperature source Oral, weight 170 lb 8 oz (77.3 kg), SpO2 95 %.   Physical Exam Constitutional:      General: She is awake.     Appearance: She is well-developed.  HENT:     Head: Normocephalic.  Eyes:     Conjunctiva/sclera: Conjunctivae normal.  Cardiovascular:     Rate and Rhythm: Normal rate and regular rhythm.     Heart sounds: Normal heart sounds.  Pulmonary:     Effort: Pulmonary effort is normal.     Breath sounds: Wheezing and rhonchi present.  Skin:    General: Skin is warm.  Neurological:     Mental Status: She is alert and oriented to person, place, and time.  Psychiatric:        Attention and Perception: Attention normal.        Mood and Affect: Mood normal.        Speech: Speech normal.        Behavior: Behavior is cooperative.     No results found for any visits on 08/19/22.  Assessment & Plan     COPD exacerbation As she was just on a course of steroids and doxy, ordered stat chest xray r/o pneumonia Chest xray: no acute  disease Rx levaquin po daily x 1 week and medrol dose pack  Advised ED precautions, if O2 < 90% at home.  Pt encouraged to make f/u appt with pulmonology ASAP  Return if symptoms worsen or fail to improve.      I, Gabrielle Kirschner, PA-C have reviewed all documentation for this visit. The documentation on  08/19/2022  for the exam, diagnosis, procedures, and orders are all accurate and complete.  Gabrielle Kirschner, PA-C [facility] [address] [place], [place], [address] Office: [phone] Fax: [email or web address]

## 2022-08-19 ENCOUNTER — Ambulatory Visit
Admission: RE | Admit: 2022-08-19 | Discharge: 2022-08-19 | Disposition: A | Discharge status: Home / Self Care | Payer: Medicare Other | Attending: Physician Assistant | Admitting: Physician Assistant

## 2022-08-19 ENCOUNTER — Encounter: Payer: Self-pay | Admitting: Physician Assistant

## 2022-08-19 ENCOUNTER — Ambulatory Visit
Admission: RE | Admit: 2022-08-19 | Discharge: 2022-08-19 | Disposition: A | Discharge status: Home / Self Care | Payer: Medicare Other | Source: Ambulatory Visit | Attending: Physician Assistant | Admitting: Physician Assistant

## 2022-08-19 ENCOUNTER — Ambulatory Visit (INDEPENDENT_AMBULATORY_CARE_PROVIDER_SITE_OTHER): Payer: Medicare Other | Admitting: Physician Assistant

## 2022-08-19 VITALS — BP 144/86 | HR 95 | Temp 97.9°F | Wt 170.5 lb

## 2022-08-19 DIAGNOSIS — J441 Chronic obstructive pulmonary disease with (acute) exacerbation: Secondary | ICD-10-CM | POA: Diagnosis not present

## 2022-08-19 MED ORDER — METHYLPREDNISOLONE 4 MG PO TBPK: ORAL_TABLET | ORAL | 0 refills | Status: DC

## 2022-08-19 MED ORDER — LEVOFLOXACIN 500 MG PO TABS: 500.0000 mg | ORAL_TABLET | Freq: Every day | ORAL | 0 refills | Status: AC

## 2022-08-27 ENCOUNTER — Encounter: Payer: Medicare Other | Admitting: *Deleted

## 2022-09-01 ENCOUNTER — Encounter: Payer: Medicare Other | Attending: Physician Assistant | Admitting: *Deleted

## 2022-09-01 DIAGNOSIS — J449 Chronic obstructive pulmonary disease, unspecified: Secondary | ICD-10-CM | POA: Diagnosis not present

## 2022-09-01 DIAGNOSIS — Z5189 Encounter for other specified aftercare: Secondary | ICD-10-CM | POA: Diagnosis not present

## 2022-09-01 NOTE — Progress Notes (Signed)
Daily Session Note  Patient Details  Name: KENSLEY VALLADARES MRN: [id number] Date of Birth: 1956-05-16 Referring Provider:   Flowsheet Row Pulmonary Rehab from 07/27/2022 in [facility] Cardiac and Pulmonary Rehab  Referring Provider [name] MD       Encounter Date: 09/01/2022  Check In:  Session Check In - 09/01/22 1011       Check-In   Supervising physician immediately available to respond to emergencies See telemetry face sheet for immediately available ER MD    Location [facility]-Cardiac & Pulmonary Rehab    Staff Present Heath Lark, RN, BSN, CCRP;Jessica [place], MA, RCEP, CCRP, Bertram Gala, MS, ACSM CEP, Exercise Physiologist;Noah Tickle, BS, Exercise Physiologist    Virtual Visit No    Medication changes reported     No    Fall or balance concerns reported    No    Warm-up and Cool-down Performed on first and last piece of equipment    Resistance Training Performed Yes    VAD Patient? No    PAD/SET Patient? No      Pain Assessment   Currently in Pain? No/denies                Social History   Tobacco Use  Smoking Status Former   Packs/day: 2.00   Years: 40.00   Total pack years: 80.00   Types: Cigarettes   Quit date: 10/30/2020   Years since quitting: 1.8  Smokeless Tobacco Never    Goals Met:  Proper associated with RPD/PD & O2 Sat Independence with exercise equipment Exercise tolerated well No report of concerns or symptoms today  Goals Unmet:  Not Applicable  Comments: Pt able to follow exercise prescription today without complaint.  Will continue to monitor for progression.    Dr. Emily Filbert is Medical Director for [facility].  Dr. Ottie Glazier is Medical Director for [facility] Pulmonary Rehabilitation.

## 2022-09-03 ENCOUNTER — Encounter: Payer: Medicare Other | Admitting: *Deleted

## 2022-09-03 DIAGNOSIS — J449 Chronic obstructive pulmonary disease, unspecified: Secondary | ICD-10-CM | POA: Diagnosis not present

## 2022-09-03 DIAGNOSIS — Z5189 Encounter for other specified aftercare: Secondary | ICD-10-CM | POA: Diagnosis not present

## 2022-09-03 NOTE — Progress Notes (Signed)
Daily Session Note  Patient Details  Name: PENIEL BIEL MRN: [id number] Date of Birth: [birth year] Referring Provider:   Flowsheet Row Pulmonary Rehab from 07/27/2022 in [facility] Cardiac and Pulmonary Rehab  Referring Provider [name] MD       Encounter Date: 09/03/2022  Check In:  Session Check In - 09/03/22 0957       Check-In   Supervising physician immediately available to respond to emergencies See telemetry face sheet for immediately available ER MD    Location [facility]-Cardiac & Pulmonary Rehab    Staff Present Darlyne Russian, RN, Lorin Mercy, MS, ACSM CEP, Exercise Physiologist;Joseph Tessie Fass, [place]    Virtual Visit No    Medication changes reported     No    Fall or balance concerns reported    No    Warm-up and Cool-down Performed on first and last piece of equipment    Resistance Training Performed Yes    VAD Patient? No    PAD/SET Patient? No      Pain Assessment   Currently in Pain? No/denies                Social History   Tobacco Use  Smoking Status Former   Packs/day: 2.00   Years: 40.00   Total pack years: 80.00   Types: Cigarettes   Quit date: 10/30/2020   Years since quitting: 1.8  Smokeless Tobacco Never    Goals Met:  Independence with exercise equipment Exercise tolerated well No report of concerns or symptoms today Strength training completed today  Goals Unmet:  Not Applicable  Comments: Pt able to follow exercise prescription today without complaint.  Will continue to monitor for progression.    Dr. Emily Filbert is Medical Director for [facility].  Dr. Ottie Glazier is Medical Director for [facility] Pulmonary Rehabilitation.

## 2022-09-06 DIAGNOSIS — J449 Chronic obstructive pulmonary disease, unspecified: Secondary | ICD-10-CM | POA: Diagnosis not present

## 2022-09-08 ENCOUNTER — Encounter: Payer: Medicare Other | Admitting: *Deleted

## 2022-09-08 DIAGNOSIS — J449 Chronic obstructive pulmonary disease, unspecified: Secondary | ICD-10-CM

## 2022-09-08 DIAGNOSIS — Z5189 Encounter for other specified aftercare: Secondary | ICD-10-CM | POA: Diagnosis not present

## 2022-09-08 NOTE — Progress Notes (Signed)
Daily Session Note  Patient Details  Name: Gabrielle Pineda MRN: [id number] Date of Birth: 1956-03-05 Referring Provider:   Flowsheet Row Pulmonary Rehab from 07/27/2022 in [facility] Cardiac and Pulmonary Rehab  Referring Provider [name] MD       Encounter Date: 09/08/2022  Check In:  Session Check In - 09/08/22 1006       Check-In   Supervising physician immediately available to respond to emergencies See telemetry face sheet for immediately available ER MD    Location [facility]-Cardiac & Pulmonary Rehab    Staff Present Heath Lark, RN, BSN, CCRP;Laureen Owens Shark, BS, RRT, CPFT;Kara Maricela Bo, MS, ACSM CEP, Exercise Physiologist;Noah Tickle, BS, Exercise Physiologist    Virtual Visit No    Medication changes reported     No    Fall or balance concerns reported    No    Warm-up and Cool-down Performed on first and last piece of equipment    Resistance Training Performed Yes    VAD Patient? No    PAD/SET Patient? No      Pain Assessment   Currently in Pain? No/denies                Social History   Tobacco Use  Smoking Status Former   Packs/day: 2.00   Years: 40.00   Total pack years: 80.00   Types: Cigarettes   Quit date: 10/30/2020   Years since quitting: 1.8  Smokeless Tobacco Never    Goals Met:  Proper associated with RPD/PD & O2 Sat Independence with exercise equipment Exercise tolerated well No report of concerns or symptoms today  Goals Unmet:  Not Applicable  Comments: Pt able to follow exercise prescription today without complaint.  Will continue to monitor for progression.    Dr. Emily Filbert is Medical Director for [facility].  Dr. Ottie Glazier is Medical Director for [facility] Pulmonary Rehabilitation.

## 2022-09-09 ENCOUNTER — Encounter: Payer: Self-pay | Admitting: *Deleted

## 2022-09-09 DIAGNOSIS — J449 Chronic obstructive pulmonary disease, unspecified: Secondary | ICD-10-CM

## 2022-09-09 NOTE — Progress Notes (Signed)
Pulmonary Individual Treatment Plan  Patient Details  Name: Gabrielle Pineda MRN: [id number] Date of Birth: [birth year] Referring Provider:   Flowsheet Row Pulmonary Rehab from 07/27/2022 in [facility] Card[facility] and Pulmonary Rehab  Referring Provider [name] MD       Initial Encounter Date:  Flowsheet Row Pulmonary Rehab from 07/27/2022 in [facility] Cardiac and Pulmonary Rehab  Date 07/27/22       Visit Diagnosis: Chronic obstructive pulmonary disease, unspecified COPD type ([place])  Patient's Home Medications on Admission:  Current Outpatient Medications:    acyclovir ointment (ZOVIRAX) 5 %, SMARTSIG:Topical Every 3 Hours, Disp: , Rfl:    albuterol (PROVENTIL) (2.5 MG/3ML) 0.083% nebulizer solution, Take 3 mLs (2.5 mg total) by nebulization every 4 (four) hours as needed for wheezing or shortness of breath., Disp: 225 mL, Rfl: 2   albuterol (VENTOLIN HFA) 108 (90 Base) MCG/ACT inhaler, INHALE TWO PUFFS BY MOUTH INTO LUNGS every SIX hours AS NEEDED FOR WHEEZING AND/OR SHORTNESS OF BREATH, Disp: 8.5 g, Rfl: 2   ARIPiprazole (ABILIFY) 5 MG tablet, Take 1 tablet (5 mg total) by mouth in the morning., Disp: 90 tablet, Rfl: 1   atorvastatin (LIPITOR) 40 MG tablet, Take 1 tablet (40 mg total) by mouth every morning., Disp: 90 tablet, Rfl: 3   benzonatate (TESSALON) 100 MG capsule, Take 1 capsule (100 mg total) by mouth 3 (three) times daily as needed for cough., Disp: 30 capsule, Rfl: 0   Budeson-Glycopyrrol-Formoterol (BREZTRI AEROSPHERE) 160-9-4.8 MCG/ACT AERO, Inhale 2 puffs into the lungs in the morning and at bedtime., Disp: 10.7 g, Rfl: 11   carvedilol (COREG) 12.5 MG tablet, TAKE ONE TABLET BY MOUTH EVERY MORNING and TAKE ONE TABLET BY MOUTH EVERYDAY AT BEDTIME, Disp: 180 tablet, Rfl: 3   ezetimibe (ZETIA) 10 MG tablet, Take 1 tablet (10 mg total) by mouth every morning., Disp: 90 tablet, Rfl: 3   gabapentin (NEURONTIN) 400 MG capsule, Take 1 capsule (400 mg total) by mouth 2 (two) times  daily., Disp: 60 capsule, Rfl: 5   leflunomide (ARAVA) 20 MG tablet, Take 20 mg by mouth at bedtime., Disp: , Rfl:    LINZESS 145 MCG CAPS capsule, Take 145 mcg by mouth daily before breakfast., Disp: , Rfl:    meloxicam (MOBIC) 15 MG tablet, Take 15 mg by mouth at bedtime., Disp: , Rfl:    methylPREDNISolone (MEDROL DOSEPAK) 4 MG TBPK tablet, Take 6 pills on day 1, 5 pills on day 2, 4 pills on day 3, 3 pills on day 4, 2 pills on day 5, 1 pill on day 6., Disp: 1 each, Rfl: 0   mirtazapine (REMERON) 15 MG tablet, TAKE ONE TABLET BY MOUTH EVERYDAY AT BEDTIME, Disp: 90 tablet, Rfl: 0   montelukast (SINGULAIR) 10 MG tablet, Take 1 tablet (10 mg total) by mouth at bedtime., Disp: 30 tablet, Rfl: 11   Multiple Vitamin (MULTIVITAMIN WITH MINERALS) TABS tablet, Take 1 tablet by mouth daily., Disp: , Rfl:    Nebulizer MISC, Compressor nebulizer to use with nebulized medications as instructed, Disp: 1 each, Rfl: 0   omeprazole (PRILOSEC) 40 MG capsule, Take 1 capsule (40 mg total) by mouth 2 (two) times daily., Disp: 180 capsule, Rfl: 1   oxyCODONE-acetaminophen (PERCOCET) 5-325 MG tablet, Take 1 tablet by mouth every 8 (eight) hours as needed for severe pain. Must last 30 days., Disp: 90 tablet, Rfl: 0   [START ON 09/19/2022] oxyCODONE-acetaminophen (PERCOCET) 5-325 MG tablet, Take 1 tablet by mouth every 8 (eight) hours as  needed for severe pain. Must last 30 days., Disp: 90 tablet, Rfl: 0   potassium chloride SA (KLOR-CON M) 20 MEQ tablet, Take 1 tablet (20 mEq total) by mouth daily with breakfast., Disp: 90 tablet, Rfl: 1   SPIKEVAX syringe, , Disp: , Rfl:    topiramate (TOPAMAX) 25 MG tablet, Take 1 tablet (25 mg total) by mouth 2 (two) times daily., Disp: 60 tablet, Rfl: 11   valACYclovir (VALTREX) 1000 MG tablet, TAKE TWO TABLETS BY MOUTH TWICE DAILY AS NEEDED FOR 5 DAYS FOR FEVER blisters, Disp: 30 tablet, Rfl: 1   venlafaxine XR (EFFEXOR-XR) 75 MG 24 hr capsule, Take 3 capsules (225 mg total) by mouth  daily with breakfast. TAKE 3 CAPSULES BY MOUTH ONCE DAILY WITH BREAKFAST, Disp: 270 capsule, Rfl: 1   XIIDRA 5 % SOLN, Place 1 drop into both eyes daily., Disp: , Rfl:   Past Medical History: Past Medical History:  Diagnosis Date   Alcohol abuse    Anemia    Anxiety    Arthritis    Cervicalgia    Cirrhosis ([place]) 1994   COPD (chronic obstructive pulmonary disease) ([place])    Depression    Difficult intubation    has plates and screws in neck   Dyspnea    GERD (gastroesophageal reflux disease)    Headache    Heart murmur    on heard when pt is lying   Hypertension    Other and unspecified hyperlipidemia    Status post insertion of spinal cord stimulator    Tachycardia    d/t questionable anxiety happens every 5- 10 years, sees [facility]    Tobacco Use: Social History   Tobacco Use  Smoking Status Former   Packs/day: 2.00   Years: 40.00   Total pack years: 80.00   Types: Cigarettes   Quit date: 10/30/2020   Years since quitting: 1.8  Smokeless Tobacco Never    Labs: Review Flowsheet  More data exists      Latest Ref Rng & Units 04/17/2019 04/19/2020 03/17/2021 06/26/2021 11/17/2021  Labs for ITP Cardiac and Pulmonary Rehab  Cholestrol 100 - 199 mg/dL 158  172  - 120  153   LDL (calc) 0 - 99 mg/dL 80  98  - 50  79   HDL-C >39 mg/dL 54  56  - 39  40   Trlycerides 0 - 149 mg/dL 135  102  - 190  202   Hemoglobin A1c 4.8 - 5.6 % - - - 5.3  -  TCO2 22 - 32 mmol/L - - 21  - -     Pulmonary Assessment Scores:  Pulmonary Assessment Scores     Row Name 07/27/22 1435         ADL UCSD   ADL Phase Entry     SOB Score total 71     Rest 0     Walk 2     Stairs 5     Bath 2     Dress 3     Shop 4       CAT Score   CAT Score 17       mMRC Score   mMRC Score 2              UCSD: Self-administered rating of dyspnea associated with activities of daily living (ADLs) 6-point scale (0 = "not at all" to 5 = "maximal or unable to do because of  breathlessness")  Scoring Scores range from 0 to  120.  Minimally important difference is 5 units  CAT: CAT can identify the health impairment of COPD patients and is better correlated with disease progression.  CAT has a scoring range of zero to 40. The CAT score is classified into four groups of low (less than 10), medium (10 - 20), high (21-30) and very high (31-40) based on the impact level of disease on health status. A CAT score over 10 suggests significant symptoms.  A worsening CAT score could be explained by an exacerbation, poor medication adherence, poor inhaler technique, or progression of COPD or comorbid conditions.  CAT MCID is 2 points  mMRC: mMRC (Modified Medical Research Council) Dyspnea Scale is used to assess the degree of baseline functional disability in patients of respiratory disease due to dyspnea. No minimal important difference is established. A decrease in score of 1 point or greater is considered a positive change.   Pulmonary Function Assessment:  Pulmonary Function Assessment - 07/27/22 1433       Pulmonary Function Tests   FVC% 64 %   Collected 02/04/22   FEV1% 54 %    FEV1/FVC Ratio 64    RV% 144 %    DLCO% 51 %      Breath   Shortness of Breath Yes;Limiting activity;Fear of Shortness of Breath             Exercise Target Goals: Exercise Program Goal: Individual exercise prescription set using results from initial 6 min walk test and THRR while considering  patient's activity barriers and safety.   Exercise Prescription Goal: Initial exercise prescription builds to 30-45 minutes a day of aerobic activity, 2-3 days per week.  Home exercise guidelines will be given to patient during program as part of exercise prescription that the participant will acknowledge.  Education: Aerobic Exercise: - Group verbal and visual presentation on the components of exercise prescription. Introduces F.I.T.T principle from ACSM for exercise prescriptions.  Reviews  F.I.T.T. principles of aerobic exercise including progression. Written material given at graduation. Flowsheet Row Pulmonary Rehab from 09/03/2022 in [facility] Cardiac and Pulmonary Rehab  Date 09/03/22  Educator [facility]  Instruction Review Code 1- Verbalizes Understanding       Education: Resistance Exercise: - Group verbal and visual presentation on the components of exercise prescription. Introduces F.I.T.T principle from ACSM for exercise prescriptions  Reviews F.I.T.T. principles of resistance exercise including progression. Written material given at graduation.    Education: Exercise & Equipment Safety: - Individual verbal instruction and demonstration of equipment use and safety with use of the equipment. Flowsheet Row Pulmonary Rehab from 09/03/2022 in [facility] Cardiac and Pulmonary Rehab  Date 07/16/22  Educator [facility]  Instruction Review Code 1- Verbalizes Understanding       Education: Exercise Physiology & General Exercise Guidelines: - Group verbal and written instruction with models to review the exercise physiology of the cardiovascular system and associated critical values. Provides general exercise guidelines with specific guidelines to those with heart or lung disease.    Education: Flexibility, Balance, Mind/Body Relaxation: - Group verbal and visual presentation with interactive activity on the components of exercise prescription. Introduces F.I.T.T principle from ACSM for exercise prescriptions. Reviews F.I.T.T. principles of flexibility and balance exercise training including progression. Also discusses the mind body connection.  Reviews various relaxation techniques to help reduce and manage stress (i.e. Deep breathing, progressive muscle relaxation, and visualization). Balance handout provided to take home. Written material given at graduation.   Activity Barriers & Risk Stratification:   6 Minute Walk:  6 Minute Walk     Row Name 07/27/22 1425         6 Minute Walk    Phase Initial     Distance 1035 feet     Walk Time 6 minutes     # of Rest Breaks 0     MPH 1.96     METS 3.24     RPE 14     Perceived Dyspnea  2     VO2 Peak 11.33     Symptoms Yes (comment)     Comments Hip pain 6/10     Resting HR 70 bpm     Resting BP 126/70     Resting Oxygen Saturation  94 %     Exercise Oxygen Saturation  during 6 min walk 92 %     Max Ex. HR 110 bpm     Max Ex. BP 184/74     2 Minute Post BP 154/72       Interval HR   1 Minute HR 96     2 Minute HR 100     3 Minute HR 99     4 Minute HR 97     5 Minute HR 103     6 Minute HR 110     2 Minute Post HR 77     Interval Heart Rate? Yes       Interval Oxygen   Interval Oxygen? Yes     Baseline Oxygen Saturation % 94 %     1 Minute Oxygen Saturation % 93 %     1 Minute Liters of Oxygen 0 L  Room Air     2 Minute Oxygen Saturation % 92 %     2 Minute Liters of Oxygen 0 L     3 Minute Oxygen Saturation % 93 %     3 Minute Liters of Oxygen 0 L     4 Minute Oxygen Saturation % 93 %     4 Minute Liters of Oxygen 0 L     5 Minute Oxygen Saturation % 93 %     5 Minute Liters of Oxygen 0 L     6 Minute Oxygen Saturation % 93 %     6 Minute Liters of Oxygen 0 L     2 Minute Post Oxygen Saturation % 94 %     2 Minute Post Liters of Oxygen 0 L             Oxygen Initial Assessment:  Oxygen Initial Assessment - 07/16/22 0910       Home Oxygen   Home Oxygen Device Home Concentrator    Sleep Oxygen Prescription Continuous    Liters per minute 2    Home Exercise Oxygen Prescription None    Home Resting Oxygen Prescription None    Compliance with Home Oxygen Use Yes      Initial 6 min Walk   Oxygen Used None      Program Oxygen Prescription   Program Oxygen Prescription None      Intervention   Short Term Goals To learn and exhibit compliance with exercise, home and travel O2 prescription;To learn and understand importance of maintaining oxygen saturations>88%;To learn and demonstrate proper  pursed lip breathing techniques or other breathing techniques. ;To learn and understand importance of monitoring SPO2 with pulse oximeter and demonstrate accurate use of the pulse oximeter.;To learn and demonstrate proper use of respiratory medications    Long  Term Goals Exhibits  compliance with exercise, home  and travel O2 prescription;Maintenance of O2 saturations>88%;Compliance with respiratory medication;Demonstrates proper use of MDI's;Exhibits proper breathing techniques, such as pursed lip breathing or other method taught during program session;Verbalizes importance of monitoring SPO2 with pulse oximeter and return demonstration             Oxygen Re-Evaluation:  Oxygen Re-Evaluation     Row Name 08/04/22 0953 09/01/22 0945           Program Oxygen Prescription   Program Oxygen Prescription Continuous Continuous      Liters per minute 2 2      Comments sleep only sleep only        Home Oxygen   Home Oxygen Device Home Concentrator Home Concentrator      Sleep Oxygen Prescription Continuous Continuous      Liters per minute 2 2      Home Exercise Oxygen Prescription None None      Home Resting Oxygen Prescription None None      Compliance with Home Oxygen Use Yes Yes        Goals/Expected Outcomes   Short Term Goals To learn and demonstrate proper pursed lip breathing techniques or other breathing techniques.  To learn and demonstrate proper pursed lip breathing techniques or other breathing techniques.       Long  Term Goals Exhibits proper breathing techniques, such as pursed lip breathing or other method taught during program session Exhibits proper breathing techniques, such as pursed lip breathing or other method taught during program session      Comments Reviewed PLB technique with pt.  Talked about how it works and it's importance in maintaining their exercise saturations. Mansi is still on 2L of O2 during sleep. She reports that she used her oxygen more during the  day while she was sick, but she has returned to only using it during sleep. We reviewd PLB technique and she was encouraged to practice PLB during exercise. She has a pulse-ox at home and she states that she has been checking her O2 saturations at home. She states that her O2 stayed around 92% while she was sick, but since she has gotten better it has been ranging between 95-96% at rest.      Goals/Expected Outcomes Short: Become more profiecient at using PLB. Long: Become independent at using PLB. Short: Become more profiecient at using PLB. Long: Become independent at using PLB.               Oxygen Discharge (Final Oxygen Re-Evaluation):  Oxygen Re-Evaluation - 09/01/22 0945       Program Oxygen Prescription   Program Oxygen Prescription Continuous    Liters per minute 2    Comments sleep only      Home Oxygen   Home Oxygen Device Home Concentrator    Sleep Oxygen Prescription Continuous    Liters per minute 2    Home Exercise Oxygen Prescription None    Home Resting Oxygen Prescription None    Compliance with Home Oxygen Use Yes      Goals/Expected Outcomes   Short Term Goals To learn and demonstrate proper pursed lip breathing techniques or other breathing techniques.     Long  Term Goals Exhibits proper breathing techniques, such as pursed lip breathing or other method taught during program session    Comments Willistine is still on 2L of O2 during sleep. She reports that she used her oxygen more during the day while she was sick,  but she has returned to only using it during sleep. We reviewd PLB technique and she was encouraged to practice PLB during exercise. She has a pulse-ox at home and she states that she has been checking her O2 saturations at home. She states that her O2 stayed around 92% while she was sick, but since she has gotten better it has been ranging between 95-96% at rest.    Goals/Expected Outcomes Short: Become more profiecient at using PLB. Long: Become independent  at using PLB.             Initial Exercise Prescription:  Initial Exercise Prescription - 07/27/22 1400       Date of Initial Exercise RX and Referring Provider   Date 07/27/22    Referring Provider [name] MD      Oxygen   Maintain Oxygen Saturation 88% or higher      Recumbant Bike   Level 2    RPM 50    Watts 30    Minutes 15    METs 3      NuStep   Level 3    SPM 80    Minutes 15    METs 3      REL-XR   Level 2    Speed 50    Minutes 15    METs 3      Track   Laps 27    Minutes 15    METs 2.47      Prescription Details   Frequency (times per week) 2    Duration Progress to 30 minutes of continuous aerobic without signs/symptoms of physical distress      Intensity   THRR 40-80% of Max Heartrate 104-138    Ratings of Perceived Exertion 11-13    Perceived Dyspnea 0-4      Progression   Progression Continue to progress workloads to maintain intensity without signs/symptoms of physical distress.      Resistance Training   Training Prescription Yes    Weight 3 lb    Reps 10-15             Perform Capillary Blood Glucose checks as needed.  Exercise Prescription Changes:   Exercise Prescription Changes     Row Name 07/27/22 1400 08/13/22 1200 09/08/22 1400         Response to Exercise   Blood Pressure (Admit) 126/70 118/70 126/84     Blood Pressure (Exercise) 184/74 132/80 130/86     Blood Pressure (Exit) 128/70 122/76 122/74     Heart Rate (Admit) 70 bpm 73 bpm 70 bpm     Heart Rate (Exercise) 110 bpm 108 bpm 95 bpm     Heart Rate (Exit) 77 bpm 73 bpm 81 bpm     Oxygen Saturation (Admit) 94 % 90 % 97 %     Oxygen Saturation (Exercise) 92 % 98 % 93 %     Oxygen Saturation (Exit) 93 % 97 % 96 %     Rating of Perceived Exertion (Exercise) '14 15 15     '$ Perceived Dyspnea (Exercise) '2 2 2     '$ Symptoms hip pain 6/10 none SOB     Comments walk test resutls third full day of exercise --     Duration -- Progress to 30 minutes of   aerobic without signs/symptoms of physical distress Continue with 30 min of aerobic exercise without signs/symptoms of physical distress.     Intensity -- THRR unchanged THRR unchanged  Progression   Progression -- Continue to progress workloads to maintain intensity without signs/symptoms of physical distress. Continue to progress workloads to maintain intensity without signs/symptoms of physical distress.     Average METs -- 2.74 2.8       Resistance Training   Training Prescription -- Yes Yes     Weight -- 3 lb 3 lb     Reps -- 10-15 10-15       Interval Training   Interval Training -- No No       Recumbant Bike   Level -- 2 2     Watts -- -- 31     Minutes -- 15 15     METs -- 2.3 2.1       NuStep   Level -- 3 3     Minutes -- 15 15     METs -- 2.9 --       REL-XR   Level -- 2 1     Minutes -- 15 15     METs -- 3.1 3       Track   Laps -- 30 30     Minutes -- 15 15     METs -- 2.63 2.63       Oxygen   Maintain Oxygen Saturation -- 88% or higher 88% or higher              Exercise Comments:   Exercise Comments     Row Name 08/04/22 0953           Exercise Comments First full day of exercise!  Patient was oriented to gym and equipment including functions, settings, policies, and procedures.  Patient's individual exercise prescription and treatment plan were reviewed.  All starting workloads were established based on the results of the 6 minute walk test done at initial orientation visit.  The plan for exercise progression was also introduced and progression will be customized based on patient's performance and goals.                Exercise Goals and Review:   Exercise Goals     Row Name 07/27/22 1431             Exercise Goals   Increase Physical Activity Yes       Intervention Provide advice, education, support and counseling about physical activity/exercise needs.;Develop an individualized exercise prescription for aerobic and  resistive training based on initial evaluation findings, risk stratification, comorbidities and participant's personal goals.       Expected Outcomes Long Term: Exercising regularly at least 3-5 days a week.;Short Term: Attend rehab on a regular basis to increase amount of physical activity.;Long Term: Add in home exercise to make exercise part of routine and to increase amount of physical activity.       Increase Strength and Stamina Yes       Intervention Provide advice, education, support and counseling about physical activity/exercise needs.;Develop an individualized exercise prescription for aerobic and resistive training based on initial evaluation findings, risk stratification, comorbidities and participant's personal goals.       Expected Outcomes Short Term: Increase workloads from initial exercise prescription for resistance, speed, and METs.;Short Term: Perform resistance training exercises routinely during rehab and add in resistance training at home;Long Term: Improve cardiorespiratory fitness, muscular endurance and strength as measured by increased METs and functional capacity (6MWT)       Able to understand and use rate of perceived exertion (RPE) scale Yes  Intervention Provide education and explanation on how to use RPE scale       Expected Outcomes Short Term: Able to use RPE daily in rehab to express subjective intensity level;Long Term:  Able to use RPE to guide intensity level when exercising independently       Able to understand and use Dyspnea scale Yes       Intervention Provide education and explanation on how to use Dyspnea scale       Expected Outcomes Short Term: Able to use Dyspnea scale daily in rehab to express subjective sense of shortness of breath during exertion;Long Term: Able to use Dyspnea scale to guide intensity level when exercising independently       Knowledge and understanding of Target Heart Rate Range (THRR) Yes       Intervention Provide education and  explanation of THRR including how the numbers were predicted and where they are located for reference       Expected Outcomes Short Term: Able to state/look up THRR;Long Term: Able to use THRR to govern intensity when exercising independently;Short Term: Able to use daily as guideline for intensity in rehab       Able to check pulse independently Yes       Intervention Provide education and demonstration on how to check pulse in carotid and radial arteries.;Review the importance of being able to check your own pulse for safety during independent exercise       Expected Outcomes Short Term: Able to explain why pulse checking is important during independent exercise;Long Term: Able to check pulse independently and accurately       Understanding of Exercise Prescription Yes       Intervention Provide education, explanation, and written materials on patient's individual exercise prescription       Expected Outcomes Short Term: Able to explain program exercise prescription;Long Term: Able to explain home exercise prescription to exercise independently                Exercise Goals Re-Evaluation :  Exercise Goals Re-Evaluation     Row Name 08/04/22 0956 08/13/22 1245 08/25/22 0824 09/01/22 0925 09/08/22 1417     Exercise Goal Re-Evaluation   Exercise Goals Review Able to understand and use rate of perceived exertion (RPE) scale;Able to understand and use Dyspnea scale;Knowledge and understanding of Target Heart Rate Range (THRR);Understanding of Exercise Prescription Understanding of Exercise Prescription;Increase Strength and Stamina;Increase Physical Activity Understanding of Exercise Prescription;Increase Strength and Stamina;Increase Physical Activity Understanding of Exercise Prescription;Increase Strength and Stamina;Increase Physical Activity Understanding of Exercise Prescription;Increase Strength and Stamina;Increase Physical Activity   Comments Reviewed RPE  and dyspnea scale, THR and  program prescription with pt today.  Pt voiced understanding and was given a copy of goals to take home. Inza is off to a good start in rehab.  She has completed her first three full days of exercise thus far.  She is already up to 30 laps!!  We will continue to monitor her progress. Kalea has not attended rehab since the last review. We will contact pt and remind her of the importance of good attendance in the program. We will continue to monitor when she returns. Kenzlei feels that she is making good progress in the program. She states that getting sick did set her back a little bit, but she is excited about getting back to her exercise routine. She states that the main benefits she has seen have been improved strength, she has lost some weight, and  she feels better overall. She also states that she has been walking at home on her days away from rehab for 45 minutes. She reports walking at home about three days a week. Panayiota is doing well in rehab as she has returned from being out a bit. As she is getting back into the routine, she was able to walk a full 30 laps around the track and increased her watts on the recumbent bike to 31 watts. Her XR stayed at a level 1 and we will encourage her to inrease it back up. Will continue to monitor.   Expected Outcomes Short: Use RPE daily to regulate intensity. Long: Follow program prescription in THR. Short: Continue to attend rehab regularly Long: Continue to follow program prescription Short: Return to regular attendance in the program. Long: Continue to follow program prescription. Short: Continue to walk on days away from rehab. Long: Continue to improve strength and stamina. Short: Increase level to 2 on the XR Long: Continue to increase overall stamin and MET level            Discharge Exercise Prescription (Final Exercise Prescription Changes):  Exercise Prescription Changes - 09/08/22 1400       Response to Exercise   Blood Pressure (Admit) 126/84     Blood Pressure (Exercise) 130/86    Blood Pressure (Exit) 122/74    Heart Rate (Admit) 70 bpm    Heart Rate (Exercise) 95 bpm    Heart Rate (Exit) 81 bpm    Oxygen Saturation (Admit) 97 %    Oxygen Saturation (Exercise) 93 %    Oxygen Saturation (Exit) 96 %    Rating of Perceived Exertion (Exercise) 15    Perceived Dyspnea (Exercise) 2    Symptoms SOB    Duration Continue with 30 min of aerobic exercise without signs/symptoms of physical distress.    Intensity THRR unchanged      Progression   Progression Continue to progress workloads to maintain intensity without signs/symptoms of physical distress.    Average METs 2.8      Resistance Training   Training Prescription Yes    Weight 3 lb    Reps 10-15      Interval Training   Interval Training No      Recumbant Bike   Level 2    Watts 31    Minutes 15    METs 2.1      NuStep   Level 3    Minutes 15      REL-XR   Level 1    Minutes 15    METs 3      Track   Laps 30    Minutes 15    METs 2.63      Oxygen   Maintain Oxygen Saturation 88% or higher             Nutrition:  Target Goals: Understanding of nutrition guidelines, daily intake of sodium '1500mg'$ , cholesterol '200mg'$ , calories 30% from fat and 7% or less from saturated fats, daily to have 5 or more servings of fruits and vegetables.  Education: All About Nutrition: -Group instruction provided by verbal, written material, interactive activities, discussions, models, and posters to present general guidelines for heart healthy nutrition including fat, fiber, MyPlate, the role of sodium in heart healthy nutrition, utilization of the nutrition label, and utilization of this knowledge for meal planning. Follow up email sent as well. Written material given at graduation.   Biometrics:  Pre Biometrics - 07/27/22 1431  Pre Biometrics   Height '5\' 7"'$  (1.702 m)    Weight 175 lb 14.4 oz (79.8 kg)    Waist Circumference 40.5 inches    Hip Circumference  41 inches    Waist to Hip Ratio 0.99 %    BMI (Calculated) 27.54    Single Leg Stand 6.7 seconds              Nutrition Therapy Plan and Nutrition Goals:  Nutrition Therapy & Goals - 07/27/22 0938       Nutrition Therapy   Diet Heart healthy, low Na, pulmonary MNT    Drug/Food Interactions Statins/Certain Fruits    Protein (specify units) 85-95g    Fiber 25 grams    Whole Grain Foods 3 servings    Saturated Fats 12 max. grams    Fruits and Vegetables 8 servings/day    Sodium 2 grams      Personal Nutrition Goals   Nutrition Goal ST: review paperwork, try eating small melas/snacks, try ensure complete LT: meet protein and energy needs, eat a variety of whole foods    Comments 67 y.o. F admitted to pulmonary rehab for COPD. PMHx includes HTN, HLD, GERD, former tobacco use, former alcohol abuse, macrocytic anemia. Relevant medications includes lipitor, buspirone, zetia, mobic, remeron, MVI with minerals, omeprazole, oxycodone, K+, breztri. Anadalay reports meeting with outpatient RD twice and feels that since her diet is limited as she reports being a "pickey eater" in addition to a reduced appetite. Reviewed what outpatient RD suggested as well as general heart healthy eating and pulmonary MNT. She does not eat until the afternoon/evening at a 3-5pm meal when she gets hungry: pork chop and some macaroni and cheese. She does not like most vegetables, but she enjoys salads (onions and cucumbers and cheese and some egg, pineapple, romaine and iceburg lettuce) and potatoes. She reports not eating a large meal at this time. Fallon reports that she has not tried the reommendations given by outpatient RD as she does not like eating when she is not hunrgy, but is open to trying the premier protein - suggested since she is not eating other meals/snacks at this time she could have ensure complete which would provide the same amount of protein for 350 calories which is closer to a meal replacement.  Encouraged that if she begins to eat more meals/snacks that premier protein would be a good option. Encouraged mechanical eating while she is still not feeling hunger cues and suggested snacks with fiber, protein, and fat such as fruit with yogurt or peanut butter. Suggested that her appetite could be lower due to COPD, anemia, and/or prolonged time eating a smaller amount. Alisa also reports her iron is still low and does not know what her MD suggested - encouraged her to reach out to her MD to see what they recommend.      Intervention Plan   Intervention Prescribe, educate and counsel regarding individualized specific dietary modifications aiming towards targeted core components such as weight, hypertension, lipid management, diabetes, heart failure and other comorbidities.;Nutrition handout(s) given to patient.    Expected Outcomes Short Term Goal: Understand basic principles of dietary content, such as calories, fat, sodium, cholesterol and nutrients.;Short Term Goal: A plan has been developed with personal nutrition goals set during dietitian appointment.;Long Term Goal: Adherence to prescribed nutrition plan.             Nutrition Assessments:  MEDIFICTS Score Key: ?70 Need to make dietary changes  40-70 Heart Healthy Diet ?  40 Therapeutic Level Cholesterol Diet  Flowsheet Row Pulmonary Rehab from 07/27/2022 in [facility] Card[facility] Pulmonary [facility]hab  Picture Your Plate Total Score on Admission 40      Picture Your Plate Scores: <38 Unhealthy dietary pattern with much room for improvement. 41-50 Dietary pattern unlikely to meet recommendations for good health and room for improvement. 51-60 More healthful dietary pattern, with some room for improvement.  >60 Healthy dietary pattern, although there may be some specific behaviors that could be improved.   Nutrition Goals Re-Evaluation:  Nutrition Goals Re-Evaluation     [place] Name 09/01/22 0936             Goals   Current Weight  172 lb 14.4 oz (78.4 kg)       Nutrition Goal Anara reports that she has continued to work on her portion control by trying to eat smaller meals and snacks. She has also been trying to eat a varietyof whole foods. We will continue to monitor her dietary habits.       Comment Short: Continue to try eating smaller meals/snacks. Long: Continue to practice nutritional habits discussed with RD.                Nutrition Goals Discharge (Final Nutrition Goals Re-Evaluation):  Nutrition Goals Re-Evaluation - 09/01/22 0936       Goals   Current Weight 172 lb 14.4 oz (78.4 kg)    Nutrition Goal Ahni reports that she has continued to work on her portion control by trying to eat smaller meals and snacks. She has also been trying to eat a varietyof whole foods. We will continue to monitor her dietary habits.    Comment Short: Continue to try eating smaller meals/snacks. Long: Continue to practice nutritional habits discussed with RD.             Psychosocial: Target Goals: Acknowledge presence or absence of significant depression and/or stress, maximize coping skills, provide positive support system. Participant is able to verbalize types and ability to use techniques and skills needed for reducing stress and depression.   Education: Stress, Anxiety, and Depression - Group verbal and visual presentation to define topics covered.  Reviews how body is impacted by stress, anxiety, and depression.  Also discusses healthy ways to reduce stress and to treat/manage anxiety and depression.  Written material given at graduation.   Education: Sleep Hygiene -Provides group verbal and written instruction about how sleep can affect your health.  Define sleep hygiene, discuss sleep cycles and impact of sleep habits. Review good sleep hygiene tips.    Initial Review & Psychosocial Screening:  Initial Psych Review & Screening - 07/16/22 0912       Initial Review   Current issues with Current  Depression;History of Depression;Current Psychotropic Meds;Current Sleep Concerns;Current Stress Concerns    Source of Stress Concerns Chronic Illness    Comments She has always had some form of depression and takes Buspar for it. Allaya feels  depressed at times but states it does not necessarily stem from something.      Family Dynamics   Good Support System? Yes    Comments She can look to her two sisters for support that live in town.      Barriers   Psychosocial barriers to participate in program The patient should benefit from training in stress management and relaxation.      Screening Interventions   Interventions To provide support and resources with identified psychosocial needs;Encouraged to exercise;Provide feedback about the  scores to participant    Expected Outcomes Short Term goal: Utilizing psychosocial counselor, staff and physician to assist with identification of specific Stressors or current issues interfering with healing process. Setting desired goal for each stressor or current issue identified.;Long Term Goal: Stressors or current issues are controlled or eliminated.;Short Term goal: Identification and review with participant of any Quality of Life or Depression concerns found by scoring the questionnaire.;Long Term goal: The participant improves quality of Life and PHQ9 Scores as seen by post scores and/or verbalization of changes             Quality of Life Scores:  Scores of 19 and below usually indicate a poorer quality of life in these areas.  A difference of  2-3 points is a clinically meaningful difference.  A difference of 2-3 points in the total score of the Quality of Life Index has been associated with significant improvement in overall quality of life, self-image, physical symptoms, and general health in studies assessing change in quality of life.  PHQ-9: Review Flowsheet  More data exists      08/13/2022 07/27/2022 05/13/2022 04/22/2022 04/21/2022   Depression screen PHQ 2/9  Decreased Interest  0  0 0  Down, Depressed, Hopeless  0  0 0  PHQ - 2 Score  0  0 0  Altered sleeping - 0  - 1  Tired, decreased energy - 0  - 1  Change in appetite - 0  - 0  Feeling bad or failure about yourself  - 0  - 0  Trouble concentrating - 0  - 0  Moving slowly or fidgety/restless - 0  - 0  Suicidal thoughts - 0  - 0  PHQ-9 Score - 0  - 2  Difficult doing work/chores - Not difficult at all  - Not difficult at all    Details       Information is confidential and restricted. Go to Review Flowsheets to unlock data.        Interpretation of Total Score  Total Score Depression Severity:  1-4 = Minimal depression, 5-9 = Mild depression, 10-14 = Moderate depression, 15-19 = Moderately severe depression, 20-27 = Severe depression   Psychosocial Evaluation and Intervention:  Psychosocial Evaluation - 07/16/22 0917       Psychosocial Evaluation & Interventions   Interventions Stress management education;Relaxation education;Encouraged to exercise with the program and follow exercise prescription    Comments She has always had some form of depression and takes Buspar for it. Kalaya feels depressed at times but states it does not necessarily stem from something.She can look to her two sisters for support that live in town.    Expected Outcomes Short: Start [facility] to help with mood. Long: Maintain a healthy mental state.    Continue Psychosocial Services  Follow up required by staff             Psychosocial Re-Evaluation:  Psychosocial Re-Evaluation     [place] Name 09/01/22 [phone]             Psychosocial Re-Evaluation   Current issues with Current Depression;Current Sleep Concerns       Comments Taytem is still taking Buspar for anxiety and depression. She states that her depressive symptoms have improved since starting the program. She states that for stress relief she likes to play game on her phone, cook, and exercise. She states that she  does have a good support system at home made up by her husband and two sisters.  She reports that she was having some sleep concerns, but her sleep has improved since starting the program.       Expected Outcomes Short: Continue to attend rehab for stress relief. Long: Maintain positive outlook.       Interventions Encouraged to attend Pulmonary Rehabilitation for the exercise       Continue Psychosocial Services  Follow up required by staff                Psychosocial Discharge (Final Psychosocial Re-Evaluation):  Psychosocial Re-Evaluation - 09/01/22 0931       Psychosocial Re-Evaluation   Current issues with Current Depression;Current Sleep Concerns    Comments Quaneisha is still taking Buspar for anxiety and depression. She states that her depressive symptoms have improved since starting the program. She states that for stress relief she likes to play game on her phone, cook, and exercise. She states that she does have a good support system at home made up by her husband and two sisters. She reports that she was having some sleep concerns, but her sleep has improved since starting the program.    Expected Outcomes Short: Continue to attend rehab for stress relief. Long: Maintain positive outlook.    Interventions Encouraged to attend Pulmonary Rehabilitation for the exercise    Continue Psychosocial Services  Follow up required by staff             Education: Education Goals: Education classes will be provided on a weekly basis, covering required topics. Participant will state understanding/return demonstration of topics presented.  Learning Barriers/Preferences:  Learning Barriers/Preferences - 07/16/22 0911       Learning Barriers/Preferences   Learning Barriers None    Learning Preferences None             General Pulmonary Education Topics:  Infection Prevention: - Provides verbal and written material to individual with discussion of infection control including  proper hand washing and proper equipment cleaning during exercise session. Flowsheet Row Pulmonary Rehab from 09/03/2022 in [facility] Cardiac and Pulmonary Rehab  Date 07/16/22  Educator [facility]  Instruction Review Code 1- Verbalizes Understanding       Falls Prevention: - Provides verbal and written material to individual with discussion of falls prevention and safety. Flowsheet Row Pulmonary Rehab from 09/03/2022 in [facility] Cardiac and Pulmonary Rehab  Date 07/16/22  Educator [facility]  Instruction Review Code 1- Verbalizes Understanding       Chronic Lung Disease Review: - Group verbal instruction with posters, models, PowerPoint presentations and videos,  to review new updates, new respiratory medications, new advancements in procedures and treatments. Providing information on websites and "800" numbers for continued self-education. Includes information about supplement oxygen, available portable oxygen systems, continuous and intermittent flow rates, oxygen safety, concentrators, and Medicare reimbursement for oxygen. Explanation of Pulmonary Drugs, including class, frequency, complications, importance of spacers, rinsing mouth after steroid MDI's, and proper cleaning methods for nebulizers. Review of basic lung anatomy and physiology related to function, structure, and complications of lung disease. Review of risk factors. Discussion about methods for diagnosing sleep apnea and types of masks and machines for OSA. Includes a review of the use of types of environmental controls: home humidity, furnaces, filters, dust mite/pet prevention, HEPA vacuums. Discussion about weather changes, air quality and the benefits of nasal washing. Instruction on Warning signs, infection symptoms, calling MD promptly, preventive modes, and value of vaccinations. Review of effective airway clearance, coughing and/or vibration techniques. Emphasizing that all should Create an Action Plan.  Written material given at  graduation. Flowsheet Row Pulmonary Rehab from 09/03/2022 in [facility] Cardiac and Pulmonary Rehab  Education need identified 07/27/22       AED/CPR: - Group verbal and written instruction with the use of models to demonstrate the basic use of the AED with the basic ABC's of resuscitation.    Anatomy and Cardiac Procedures: - Group verbal and visual presentation and models provide information about basic cardiac anatomy and function. Reviews the testing methods done to diagnose heart disease and the outcomes of the test results. Describes the treatment choices: Medical Management, Angioplasty, or Coronary Bypass Surgery for treating various heart conditions including Myocardial Infarction, Angina, Valve Disease, and Cardiac Arrhythmias.  Written material given at graduation.   Medication Safety: - Group verbal and visual instruction to review commonly prescribed medications for heart and lung disease. Reviews the medication, class of the drug, and side effects. Includes the steps to properly store meds and maintain the prescription regimen.  Written material given at graduation.   Other: -Provides group and verbal instruction on various topics (see comments)   Knowledge Questionnaire Score:  Knowledge Questionnaire Score - 07/27/22 1432       Knowledge Questionnaire Score   Pre Score 14/18              Core Components/Risk Factors/Patient Goals at Admission:  Personal Goals and Risk Factors at Admission - 07/27/22 1433       Core Components/Risk Factors/Patient Goals on Admission    Weight Management Yes;Weight Loss    Intervention Weight Management: Develop a combined nutrition and exercise program designed to reach desired caloric intake, while maintaining appropriate intake of nutrient and fiber, sodium and fats, and appropriate energy expenditure required for the weight goal.;Weight Management: Provide education and appropriate resources to help participant work on and attain  dietary goals.;Weight Management/Obesity: Establish reasonable short term and long term weight goals.    Admit Weight 175 lb 14.4 oz (79.8 kg)    Goal Weight: Short Term 170 lb (77.1 kg)    Goal Weight: Long Term 165 lb (74.8 kg)    Expected Outcomes Long Term: Adherence to nutrition and physical activity/exercise program aimed toward attainment of established weight goal;Short Term: Continue to assess and modify interventions until short term weight is achieved;Weight Loss: Understanding of general recommendations for a balanced deficit meal plan, which promotes 1-2 lb weight loss per week and includes a negative energy balance of [phone] kcal/d;Understanding of distribution of calorie intake throughout the day with the consumption of 4-5 meals/snacks;Understanding recommendations for meals to include 15-35% energy as protein, 25-35% energy from fat, 35-60% energy from carbohydrates, less than '200mg'$  of dietary cholesterol, 20-35 gm of total fiber daily    Improve shortness of breath with ADL's Yes    Intervention Provide education, individualized exercise plan and daily activity instruction to help decrease symptoms of SOB with activities of daily living.    Expected Outcomes Short Term: Improve cardiorespiratory fitness to achieve a reduction of symptoms when performing ADLs;Long Term: Be able to perform more ADLs without symptoms or delay the onset of symptoms    Increase knowledge of respiratory medications and ability to use respiratory devices properly  Yes    Intervention Provide education and demonstration as needed of appropriate use of medications, inhalers, and oxygen therapy.    Expected Outcomes Short Term: Achieves understanding of medications use. Understands that oxygen is a medication prescribed by physician. Demonstrates appropriate use of inhaler and oxygen therapy.;Long Term: Maintain appropriate  use of medications, inhalers, and oxygen therapy.    Hypertension Yes    Intervention  Provide education on lifestyle modifcations including regular physical activity/exercise, weight management, moderate sodium restriction and increased consumption of fresh fruit, vegetables, and low fat dairy, alcohol moderation, and smoking cessation.;Monitor prescription use compliance.    Expected Outcomes Short Term: Continued assessment and intervention until BP is < 140/22m HG in hypertensive participants. < 130/873mHG in hypertensive participants with diabetes, heart failure or chronic kidney disease.;Long Term: Maintenance of blood pressure at goal levels.    Lipids Yes    Intervention Provide education and support for participant on nutrition & aerobic/resistive exercise along with prescribed medications to achieve LDL '70mg'$ , HDL >'40mg'$ .    Expected Outcomes Short Term: Participant states understanding of desired cholesterol values and is compliant with medications prescribed. Participant is following exercise prescription and nutrition guidelines.;Long Term: Cholesterol controlled with medications as prescribed, with individualized exercise RX and with personalized nutrition plan. Value goals: LDL < '70mg'$ , HDL > 40 mg.             Education:Diabetes - Individual verbal and written instruction to review signs/symptoms of diabetes, desired ranges of glucose level fasting, after meals and with exercise. Acknowledge that pre and post exercise glucose checks will be done for 3 sessions at entry of program.   Know Your Numbers and Heart Failure: - Group verbal and visual instruction to discuss disease risk factors for cardiac and pulmonary disease and treatment options.  Reviews associated critical values for Overweight/Obesity, Hypertension, Cholesterol, and Diabetes.  Discusses basics of heart failure: signs/symptoms and treatments.  Introduces Heart Failure Zone chart for action plan for heart failure.  Written material given at graduation. [facility]  Date 08/06/22  Educator SB  Instruction Review Code 1- Verbalizes Understanding       Core Components/Risk Factors/Patient Goals Review:   Goals and Risk Factor Review     Row Name 09/01/22 [phone]           Core Components/Risk Factors/Patient Goals Review   Personal Goals Review Weight Management/Obesity;Hypertension       Review Andee reports that she has lost some weight as she is down from 175 lb to 172.9 lb. She is still working toward her long term weight goal of 165 lb. She has been checking her BP at home and reports that they have been around 130/78. She was encouraged to continue to check her BP at home.       Expected Outcomes Short: Continue to check BP at home. Long: Continue to work toward long term weight goal.                Core Components/Risk Factors/Patient Goals at Discharge (Final Review):   Goals and Risk Factor Review - 09/01/22 0939       Core Components/Risk Factors/Patient Goals Review   Personal Goals Review Weight Management/Obesity;Hypertension    Review Satya reports that she has lost some weight as she is down from 175 lb to 172.9 lb. She is still working toward her long term weight goal of 165 lb. She has been checking her BP at home and reports that they have been around 130/78. She was encouraged to continue to check her BP at home.    Expected Outcomes Short: Continue to check BP at home. Long: Continue to work toward long term weight goal.  ITP Comments:  ITP Comments     Row Name 07/16/22 0909 07/27/22 1425 08/04/22 0953 08/12/22 0956 09/09/22 0904   ITP Comments Virtual Visit completed. Patient informed on EP and RD appointment and 6 Minute walk test. Patient also informed of patient health questionnaires on My Chart. Patient Verbalizes understanding. Visit diagnosis can be found in [facility] 07/07/2022. Completed 6MWT and gym orientation. Initial ITP created and sent for review to Dr. Zetta Bills, Medical Director. First full day of exercise!  Patient was oriented to gym and equipment including functions, settings, policies, and procedures.  Patient's individual exercise prescription and treatment plan were reviewed.  All starting workloads were established based on the results of the 6 minute walk test done at initial orientation visit.  The plan for exercise progression was also introduced and progression will be customized based on patient's performance and goals. 30 Day review completed. Medical Director ITP review done, changes made as directed, and signed approval by Medical Director.    new to program 30 Day review completed. Medical Director ITP review done, changes made as directed, and signed approval by Medical Director.            Comments:

## 2022-09-10 ENCOUNTER — Other Ambulatory Visit: Payer: Self-pay | Admitting: Physician Assistant

## 2022-09-10 ENCOUNTER — Other Ambulatory Visit: Payer: Self-pay | Admitting: Student in an Organized Health Care Education/Training Program

## 2022-09-10 ENCOUNTER — Encounter: Payer: Self-pay | Admitting: Physician Assistant

## 2022-09-10 ENCOUNTER — Encounter: Payer: Medicare Other | Admitting: *Deleted

## 2022-09-10 DIAGNOSIS — J449 Chronic obstructive pulmonary disease, unspecified: Secondary | ICD-10-CM

## 2022-09-10 DIAGNOSIS — Z5189 Encounter for other specified aftercare: Secondary | ICD-10-CM | POA: Diagnosis not present

## 2022-09-10 MED ORDER — ACYCLOVIR 5 % EX OINT: TOPICAL_OINTMENT | CUTANEOUS | 1 refills | Status: DC

## 2022-09-10 NOTE — Progress Notes (Signed)
Daily Session Note  Patient Details  Name: Gabrielle Pineda MRN: [id number] Date of Birth: 1955/11/27 Referring Provider:   Flowsheet Row Pulmonary Rehab from 07/27/2022 in [facility] Cardiac [facility] Pulmonary Rehab  Referring Provider [name] MD       Encounter Date: 09/10/2022  Check In:  Session Check In - 09/10/22 0932       Check-In   Supervising physician immediately available to respond to emergencies See telemetry face sheet for immediately available ER MD    Location [facility]-Cardiac & Pulmonary Rehab    Staff Present Darlyne Russian, RN, Lorin Mercy, MS, ACSM CEP, Exercise Physiologist;Joseph Tessie Fass, [place]    Virtual Visit No    Medication changes reported     No    Fall or balance concerns reported    No    Warm-up and Cool-down Performed on first and last piece of equipment    Resistance Training Performed Yes    VAD Patient? No    PAD/SET Patient? No      Pain Assessment   Currently in Pain? No/denies                Social History   Tobacco Use  Smoking Status Former   Packs/day: 2.00   Years: 40.00   Total pack years: 80.00   Types: Cigarettes   Quit date: 10/30/2020   Years since quitting: 1.8  Smokeless Tobacco Never    Goals Met:  Independence with exercise equipment Exercise tolerated well No report of concerns or symptoms today Strength training completed today  Goals Unmet:  Not Applicable  Comments: Pt able to follow exercise prescription today without complaint.  Will continue to monitor for progression.    Dr. Emily Filbert is Medical Director for [facility].  Dr. Ottie Glazier is Medical Director for [facility] Pulmonary Rehabilitation.

## 2022-09-11 ENCOUNTER — Inpatient Hospital Stay (HOSPITAL_BASED_OUTPATIENT_CLINIC_OR_DEPARTMENT_OTHER): Payer: Medicare Other | Admitting: Oncology

## 2022-09-11 ENCOUNTER — Encounter: Payer: Self-pay | Admitting: Oncology

## 2022-09-11 ENCOUNTER — Inpatient Hospital Stay: Payer: Medicare Other | Attending: Oncology

## 2022-09-11 VITALS — BP 156/78 | HR 70 | Temp 97.7°F | Resp 18 | Wt 170.4 lb

## 2022-09-11 DIAGNOSIS — D539 Nutritional anemia, unspecified: Secondary | ICD-10-CM | POA: Diagnosis not present

## 2022-09-11 DIAGNOSIS — Z79899 Other long term (current) drug therapy: Secondary | ICD-10-CM | POA: Insufficient documentation

## 2022-09-11 DIAGNOSIS — M069 Rheumatoid arthritis, unspecified: Secondary | ICD-10-CM | POA: Diagnosis not present

## 2022-09-11 LAB — CBC WITH DIFFERENTIAL/PLATELET
Abs Immature Granulocytes: 0.01 10*3/uL (ref 0.00–0.07)
Basophils Absolute: 0 10*3/uL (ref 0.0–0.1)
Basophils Relative: 1 %
Eosinophils Absolute: 0.2 10*3/uL (ref 0.0–0.5)
Eosinophils Relative: 5 %
HCT: 32.6 % — ABNORMAL LOW (ref 36.0–46.0)
Hemoglobin: 10.1 g/dL — ABNORMAL LOW (ref 12.0–15.0)
Immature Granulocytes: 0 %
Lymphocytes Relative: 35 %
Lymphs Abs: 1.5 10*3/uL (ref 0.7–4.0)
MCH: 30.7 pg (ref 26.0–34.0)
MCHC: 31 g/dL (ref 30.0–36.0)
MCV: 99.1 fL (ref 80.0–100.0)
Monocytes Absolute: 0.7 10*3/uL (ref 0.1–1.0)
Monocytes Relative: 15 %
Neutro Abs: 1.9 10*3/uL (ref 1.7–7.7)
Neutrophils Relative %: 44 %
Platelets: 253 10*3/uL (ref 150–400)
RBC: 3.29 MIL/uL — ABNORMAL LOW (ref 3.87–5.11)
RDW: 13.2 % (ref 11.5–15.5)
WBC: 4.4 10*3/uL (ref 4.0–10.5)
nRBC: 0 % (ref 0.0–0.2)

## 2022-09-11 LAB — VITAMIN B12: Vitamin B-12: 380 pg/mL (ref 180–914)

## 2022-09-11 LAB — COMPREHENSIVE METABOLIC PANEL
ALT: 17 U/L (ref 0–44)
AST: 22 U/L (ref 15–41)
Albumin: 3.7 g/dL (ref 3.5–5.0)
Alkaline Phosphatase: 99 U/L (ref 38–126)
Anion gap: 8 (ref 5–15)
BUN: 11 mg/dL (ref 8–23)
CO2: 22 mmol/L (ref 22–32)
Calcium: 8.8 mg/dL — ABNORMAL LOW (ref 8.9–10.3)
Chloride: 109 mmol/L (ref 98–111)
Creatinine, Ser: 1 mg/dL (ref 0.44–1.00)
GFR, Estimated: >60 mL/min (ref >60)
Glucose, Bld: 110 mg/dL — ABNORMAL HIGH (ref 70–99)
Potassium: 4.1 mmol/L (ref 3.5–5.1)
Sodium: 139 mmol/L (ref 135–145)
Total Bilirubin: 0.4 mg/dL (ref 0.3–1.2)
Total Protein: 6.8 g/dL (ref 6.5–8.1)

## 2022-09-11 LAB — FOLATE: Folate: 11.2 ng/mL (ref >5.9)

## 2022-09-11 NOTE — Progress Notes (Signed)
Hematology/Oncology Progress note [phone] [phone]         Patient Care Team: Mikey Kirschner, PA-C as PCP - General (Physician Assistant) Minna Merritts, MD as PCP - Cardiology (Cardiology) Manya Silvas, MD (Inactive) (Gastroenterology) Germaine Pomfret, [facility] (Pharmacist)   REFERRING PROVIDER: Mikey Kirschner, PA-C  CHIEF COMPLAINTS/REASON FOR VISIT:  Anemia  ASSESSMENT & PLAN:  Macrocytic anemia Chronic normocytic anemia Labs reviewed and discussed with patient. Normal iron panel, B12 and folate, normal TSH.  Negative multiple myeloma panel, light chain ratio.  Normal LDH. Anemia could be secondary to chronic inflammation/autoimmune disease.  macrocytosis could be due to Drug side effects Leflunomide, can not rule out underlying bone marrow disease, i.e. MDS. Her hemoglobin has improved.  I recommend monitor labs, if progressive cytopenia, will proceed with bone marrow biopsy for further evaluation. Gabrielle Pineda agrees.       Arthritis or polyarthritis, rheumatoid ([place]) Currently on Arava and a sulfasalazine.  Chronic inflammation may contribute to her anemia.   Orders Placed This Encounter  Procedures   CBC with Differential/Platelet    Standing Status:   Future    Standing Expiration Date:   09/12/2023   Follow-up  3 months. All questions were answered. The patient knows to call the clinic with any problems, questions or concerns.  Earlie Server, MD, PhD [facility] Hematology Oncology 09/11/2022     HISTORY OF PRESENTING ILLNESS:  Gabrielle Pineda is a  67 y.o.  female with PMH listed below who was referred to me for anemia Reviewed patient's recent labs that was done.  Gabrielle Pineda was found to have abnormal CBC on 02/20/2022, Gabrielle Pineda has a hemoglobin of 10, MCV 96, normal white count and platelet counts.  Normal differential.  Iron panel showed TIBC 248, ferritin 173, iron saturation 44.  Folate 15.4, vitamin B12 674. Reviewed patient's previous  labs ordered by primary care physician's office, anemia is chronic onset , duration is since July 2022. No aggravating or improving factors. Patient reports feeling fatigued. Gabrielle Pineda denies recent chest pain on exertion, shortness of breath on minimal exertion, pre-syncopal episodes, or palpitations Gabrielle Pineda had not noticed any recent bleeding such as epistaxis, hematuria or hematochezia.  Gabrielle Pineda takes over the counter NSAID [Advil, Mobic].  Patient has autoimmune disease-rheumatoid arthritis, Gabrielle Pineda is taking Arava and sulfasalazine. Her last colonoscopy was 11/01/2017. Gabrielle Pineda denies any pica and eats a variety of diet.  Patient has a history of alcohol abuse and Gabrielle Pineda has been sober for many years.  Gabrielle Pineda had alcoholic cirrhosis diagnosed in 1994.  All recovered per patient.   INTERVAL HISTORY Gabrielle Pineda is a 67 y.o. female who has above history reviewed by me today presents for follow up visit for management of anemia Patient is currently off Sulfazalazine, due to mucositis. Gabrielle Pineda is on Leflunomide for RA.  Symptoms are controlled otherwise Gabrielle Pineda has no new complaints.   MEDICAL HISTORY:  Past Medical History:  Diagnosis Date   Alcohol abuse    Anemia    Anxiety    Arthritis    Cervicalgia    Cirrhosis ([place]) 1994   COPD (chronic obstructive pulmonary disease) ([place])    Depression    Difficult intubation    has plates and screws in neck   Dyspnea    GERD (gastroesophageal reflux disease)    Headache    Heart murmur    on heard when pt is lying   Hypertension    Other and unspecified hyperlipidemia    Status post  insertion of spinal cord stimulator    Tachycardia    d/t questionable anxiety happens every 5- 10 years, sees [facility]    SURGICAL HISTORY: Past Surgical History:  Procedure Laterality Date   ANTERIOR CERVICAL DECOMP/DISCECTOMY FUSION  2012, 2015, 2018   x3   AUGMENTATION MAMMAPLASTY Bilateral [place] RELEASE  11/11/2011    Procedure: CARPAL TUNNEL RELEASE;  Surgeon: Floyce Stakes, MD;  Location: [facility] NEURO ORS;  Service: Neurosurgery;  Laterality: Right;  Right Median Nerve Decompression   CARPAL TUNNEL RELEASE  2013   COLONOSCOPY WITH PROPOFOL N/A 11/01/2017   Procedure: COLONOSCOPY WITH PROPOFOL;  Surgeon: Manya Silvas, MD;  Location: [facility]COPY;  Service: Endoscopy;  Laterality: N/A;   ESOPHAGOGASTRODUODENOSCOPY     LUMBAR LAMINECTOMY/DECOMPRESSION MICRODISCECTOMY N/A 08/15/2019   Procedure: Lumbar three to Sacral one Decompressive lumbar laminectomy;  Surgeon: Erline Levine, MD;  Location: [place];  Service: Neurosurgery;  Laterality: N/A;   neck disc surgery     plates and screws in neck x 2   ROTATOR CUFF REPAIR  06/16/2016   right shoulder    ROTATOR CUFF REPAIR     SPINAL CORD STIMULATOR INSERTION N/A 03/17/2021   Procedure: THORACIC SPINAL CORD STIMULATOR (PERCUTANEOUS) & PULSE GENERATOR PLACEMENT;  Surgeon: [name], MD;  Location: [facility] ORS;  Service: Neurosurgery;  Laterality: N/A;   TONSILLECTOMY AND ADENOIDECTOMY  1973    TOTAL HIP ARTHROPLASTY Right 04/15/2021   Procedure: TOTAL HIP ARTHROPLASTY ANTERIOR APPROACH;  Surgeon: Hessie Knows, MD;  Location: [facility] ORS;  Service: Orthopedics;  Laterality: Right;    SOCIAL HISTORY: Social History   Socioeconomic History   Marital status: Married    Spouse name: jerry   Number of children: 0   Years of education: Not on file   Highest education level: Bachelor's degree (e.g., BA, AB, BS)  Occupational History   Not on file  Tobacco Use   Smoking status: Former    Packs/day: 2.00    Years: 40.00    Total pack years: 80.00    Types: Cigarettes    Quit date: 10/30/2020    Years since quitting: 1.8   Smokeless tobacco: Never  Vaping Use   Vaping Use: Former   Devices: vape occasionally  Substance and Sexual Activity   Alcohol use: No    Comment: recovering alcoholic Since 8841    Drug use: No   Sexual activity: Not Currently   Other Topics Concern   Not on file  Social History Narrative   Married; full time; does not get regular exercise.    Social Determinants of Health   Financial Resource Strain: Low Risk  (11/03/2021)   Overall Financial Resource Strain (CARDIA)    Difficulty of Paying Living Expenses: Not very hard  Food Insecurity: No Food Insecurity (11/03/2021)   Hunger Vital Sign    Worried About Running Out of Food in the Last Year: Never true    Ran Out of Food in the Last Year: Never true  Transportation Needs: No Transportation Needs (11/03/2021)   PRAPARE - Hydrologist (Medical): No    Lack of Transportation (Non-Medical): No  Physical Activity: Inactive (11/03/2021)   Exercise Vital Sign    Days of Exercise per Week: 0 days    Minutes of Exercise per Session: 0 min  Stress: Stress Concern Present (11/03/2021)   [place]  Feeling of Stress : To some extent  Social Connections: Moderately Isolated (11/03/2021)   Social Connection and Isolation Panel [NHANES]    Frequency of Communication with Friends and Family: More than three times a week    Frequency of Social Gatherings with Friends and Family: More than three times a week    Attends Religious Services: Never    Marine scientist or Organizations: No    Attends Archivist Meetings: Never    Marital Status: Married  Human resources officer Violence: Not At Risk (11/03/2021)   Humiliation, Afraid, Rape, and Kick questionnaire    Fear of Current or Ex-Partner: No    Emotionally Abused: No    Physically Abused: No    Sexually Abused: No    FAMILY HISTORY: Family History  Problem Relation Age of Onset   Alcohol abuse Mother    Bipolar disorder Mother    Suicidality Mother    Pancreatic cancer Father    Alcohol abuse Father    Drug abuse Sister    Alcohol abuse Sister    Depression Sister    Anesthesia problems Neg Hx     Breast cancer Neg Hx     ALLERGIES:  is allergic to sulfasalazine and plaquenil [hydroxychloroquine].  MEDICATIONS:  Current Outpatient Medications  Medication Sig Dispense Refill   acyclovir ointment (ZOVIRAX) 5 % :Topical Every 3 Hours as needed for lesion 30 g 1   albuterol (PROVENTIL) (2.5 MG/3ML) 0.083% nebulizer solution Take 3 mLs (2.5 mg total) by nebulization every 4 (four) hours as needed for wheezing or shortness of breath. 225 mL 2   albuterol (VENTOLIN HFA) 108 (90 Base) MCG/ACT inhaler INHALE TWO PUFFS BY MOUTH INTO LUNGS every SIX hours AS NEEDED FOR WHEEZING AND/OR SHORTNESS OF BREATH 8.5 g 2   ARIPiprazole (ABILIFY) 5 MG tablet Take 1 tablet (5 mg total) by mouth in the morning. 90 tablet 1   atorvastatin (LIPITOR) 40 MG tablet Take 1 tablet (40 mg total) by mouth every morning. 90 tablet 3   Budeson-Glycopyrrol-Formoterol (BREZTRI AEROSPHERE) 160-9-4.8 MCG/ACT AERO Inhale 2 puffs into the lungs in the morning and at bedtime. 10.7 g 11   carvedilol (COREG) 12.5 MG tablet TAKE ONE TABLET BY MOUTH EVERY MORNING and TAKE ONE TABLET BY MOUTH EVERYDAY AT BEDTIME 180 tablet 3   ezetimibe (ZETIA) 10 MG tablet Take 1 tablet (10 mg total) by mouth every morning. 90 tablet 3   gabapentin (NEURONTIN) 400 MG capsule Take 1 capsule (400 mg total) by mouth 2 (two) times daily. 60 capsule 5   leflunomide (ARAVA) 20 MG tablet Take 20 mg by mouth at bedtime.     LINZESS 145 MCG CAPS capsule Take 145 mcg by mouth daily before breakfast.     meloxicam (MOBIC) 15 MG tablet Take 15 mg by mouth at bedtime.     mirtazapine (REMERON) 15 MG tablet TAKE ONE TABLET BY MOUTH EVERYDAY AT BEDTIME 90 tablet 0   montelukast (SINGULAIR) 10 MG tablet Take 1 tablet (10 mg total) by mouth at bedtime. 30 tablet 11   Multiple Vitamin (MULTIVITAMIN WITH MINERALS) TABS tablet Take 1 tablet by mouth daily.     Nebulizer MISC Compressor nebulizer to use with nebulized medications as instructed 1 each 0   omeprazole  (PRILOSEC) 40 MG capsule Take 1 capsule (40 mg total) by mouth 2 (two) times daily. 180 capsule 1   oxyCODONE-acetaminophen (PERCOCET) 5-325 MG tablet Take 1 tablet by mouth every 8 (eight) hours as needed  for severe pain. Must last 30 days. 90 tablet 0   [START ON 09/19/2022] oxyCODONE-acetaminophen (PERCOCET) 5-325 MG tablet Take 1 tablet by mouth every 8 (eight) hours as needed for severe pain. Must last 30 days. 90 tablet 0   potassium chloride SA (KLOR-CON M) 20 MEQ tablet Take 1 tablet (20 mEq total) by mouth daily with breakfast. 90 tablet 1   SPIKEVAX syringe      topiramate (TOPAMAX) 25 MG tablet Take 1 tablet (25 mg total) by mouth 2 (two) times daily. 60 tablet 11   valACYclovir (VALTREX) 1000 MG tablet TAKE TWO TABLETS BY MOUTH TWICE DAILY AS NEEDED FOR 5 DAYS FOR FEVER blisters 30 tablet 1   venlafaxine XR (EFFEXOR-XR) 75 MG 24 hr capsule Take 3 capsules (225 mg total) by mouth daily with breakfast. TAKE 3 CAPSULES BY MOUTH ONCE DAILY WITH BREAKFAST 270 capsule 1   XIIDRA 5 % SOLN Place 1 drop into both eyes daily.     benzonatate (TESSALON) 100 MG capsule Take 1 capsule (100 mg total) by mouth 3 (three) times daily as needed for cough. 30 capsule 0   No current facility-administered medications for this visit.    Review of Systems  Constitutional:  Positive for fatigue. Negative for appetite change, chills and fever.  HENT:   Negative for hearing loss and voice change.   Eyes:  Negative for eye problems.  Respiratory:  Negative for chest tightness and cough.   Cardiovascular:  Negative for chest pain.  Gastrointestinal:  Negative for abdominal distention, abdominal pain and blood in stool.  Endocrine: Negative for hot flashes.  Genitourinary:  Negative for difficulty urinating and frequency.   Musculoskeletal:  Positive for arthralgias.  Skin:  Negative for itching and rash.  Neurological:  Negative for extremity weakness.  Hematological:  Negative for adenopathy.   Psychiatric/Behavioral:  Negative for confusion.     PHYSICAL EXAMINATION: ECOG PERFORMANCE STATUS: 1 - Symptomatic but completely ambulatory Vitals:   09/11/22 1157  BP: (!) 156/78  Pulse: 70  Resp: 18  Temp: 97.7 F (36.5 C)  SpO2: 98%   Filed Weights   09/11/22 1157  Weight: 170 lb 6.4 oz (77.3 kg)     Physical Exam Constitutional:      General: Gabrielle Pineda is not in acute distress. HENT:     Head: Normocephalic and atraumatic.  Eyes:     General: No scleral icterus. Cardiovascular:     Rate and Rhythm: Normal rate and regular rhythm.     Heart sounds: Normal heart sounds.  Pulmonary:     Effort: Pulmonary effort is normal. No respiratory distress.     Breath sounds: No wheezing.  Abdominal:     General: Bowel sounds are normal. There is no distension.     Palpations: Abdomen is soft.  Musculoskeletal:        General: No deformity. Normal range of motion.     Cervical back: Normal range of motion and neck supple.  Skin:    General: Skin is warm and dry.     Findings: No erythema or rash.  Neurological:     Mental Status: Gabrielle Pineda is alert and oriented to person, place, and time. Mental status is at baseline.     Cranial Nerves: No cranial nerve deficit.     Coordination: Coordination normal.  Psychiatric:        Mood and Affect: Mood normal.      LABORATORY DATA:  I have reviewed the data as listed  Latest Ref Rng & Units 09/11/2022   11:42 AM 06/08/2022   12:40 PM 03/10/2022   10:11 AM  CBC  WBC 4.0 - 10.5 K/uL 4.4  11.3  5.7   Hemoglobin 12.0 - 15.0 g/dL 10.1  9.7  9.8   Hematocrit 36.0 - 46.0 % 32.6  31.7  31.1   Platelets 150 - 400 K/uL 253  195  223       Latest Ref Rng & Units 09/11/2022   11:42 AM 06/08/2022   12:40 PM 03/10/2022   10:11 AM  CMP  Glucose 70 - 99 mg/dL 110  89  108   BUN 8 - 23 mg/dL '11  9  11   '$ Creatinine 0.44 - 1.00 mg/dL 1.00  1.03  1.02   Sodium 135 - 145 mmol/L 139  135  133   Potassium 3.5 - 5.1 mmol/L 4.1  3.8  3.5    Chloride 98 - 111 mmol/L 109  108  109   CO2 22 - 32 mmol/L '22  21  19   '$ Calcium 8.9 - 10.3 mg/dL 8.8  8.1  8.6   Total Protein 6.5 - 8.1 g/dL 6.8  6.9  6.2   Total Bilirubin 0.3 - 1.2 mg/dL 0.4  0.4  0.5   Alkaline Phos 38 - 126 U/L 99  113  103   AST 15 - 41 U/L 22  37  22   ALT 0 - 44 U/L '17  28  14     '$ Lab Results  Component Value Date   IRON 23 (L) 06/08/2022   TIBC 276 06/08/2022   FERRITIN 129 06/08/2022    RADIOGRAPHIC STUDIES: I have personally reviewed the radiological images as listed and agreed with the findings in the report. DG Chest 2 View  Result Date: 08/19/2022 CLINICAL DATA:  COPD exacerbation EXAM: CHEST - 2 VIEW COMPARISON:  04/21/22 CXR FINDINGS: No pleural effusion. No pneumothorax. No focal airspace opacity. Cervical spinal fusion hardware in place. Spinal nerve stimulator leads in place. Vertebral body heights are maintained. Normal cardiac and mediastinal contours. No displaced rib fractures. Chronic fractures of left ribs 8 and 9. Visualized upper abdomen is unremarkable. IMPRESSION: No active cardiopulmonary disease. Electronically Signed   By: Marin Roberts M.D.   On: 08/19/2022 10:49

## 2022-09-11 NOTE — Assessment & Plan Note (Addendum)
Chronic normocytic anemia Labs reviewed and discussed with patient. Normal iron panel, B12 and folate, normal TSH.  Negative multiple myeloma panel, light chain ratio.  Normal LDH. Anemia could be secondary to chronic inflammation/autoimmune disease.  macrocytosis could be due to Drug side effects Leflunomide, can not rule out underlying bone marrow disease, i.e. MDS. Her hemoglobin has improved.  I recommend monitor labs, if progressive cytopenia, will proceed with bone marrow biopsy for further evaluation. She agrees.

## 2022-09-11 NOTE — Progress Notes (Signed)
Pt here for follow up. No new concerns voiced.   

## 2022-09-11 NOTE — Assessment & Plan Note (Signed)
Currently on Uncertain and a sulfasalazine.  Chronic inflammation may contribute to her anemia.

## 2022-09-14 ENCOUNTER — Telehealth: Payer: Self-pay

## 2022-09-14 NOTE — Progress Notes (Signed)
Care Management & Coordination Services Pharmacy Team Pharmacy Assistant   Name: Gabrielle Pineda  MRN: [id number] DOB: [birth year]  Reason for Encounter: Medication Coordination and Delivery for [facility]  Chart review: Recent office visits:  08/19/2022 Mikey Kirschner, PA-C (PCP Office Visit) for COPD Exacerbation- Started: Levofloxacin 500 mg daily, Methylprednisolone taper pack, Stopped: Doxycycline, Prednisone 40 mg BP: 144/86  Recent consult visits:  09/11/2022 Earlie Server, MD (Oncology) for Follow-up- Stopped: Methylprednisolone taper pack, Benzonatate 100 mg, Lab order placed, BP: 156/78, patient to follow-up in 3 months  09/10/2022 Darlyne Russian, RN (Cardiac Rehab) No medication changes noted, No orders placed  09/08/2022 Heath Lark, RN (Cardiac Rehab) No medication changes noted, No orders placed  09/03/2022 Darlyne Russian, RN (Cardiac Rehab) No medication changes noted, No orders placed  09/01/2022 Heath Lark, RN (Cardiac Rehab) No medication changes noted, No orders placed  Hospital visits:  None in previous 6 months  Medications: Outpatient Encounter Medications as of 09/14/2022  Medication Sig   acyclovir ointment (ZOVIRAX) 5 % :Topical Every 3 Hours as needed for lesion   albuterol (PROVENTIL) (2.5 MG/3ML) 0.083% nebulizer solution Take 3 mLs (2.5 mg total) by nebulization every 4 (four) hours as needed for wheezing or shortness of breath.   albuterol (VENTOLIN HFA) 108 (90 Base) MCG/ACT inhaler INHALE TWO PUFFS BY MOUTH INTO LUNGS every SIX hours AS NEEDED FOR WHEEZING AND/OR SHORTNESS OF BREATH   ARIPiprazole (ABILIFY) 5 MG tablet Take 1 tablet (5 mg total) by mouth in the morning.   atorvastatin (LIPITOR) 40 MG tablet Take 1 tablet (40 mg total) by mouth every morning.   Budeson-Glycopyrrol-Formoterol (BREZTRI AEROSPHERE) 160-9-4.8 MCG/ACT AERO Inhale 2 puffs into the lungs in the morning and at bedtime.   carvedilol (COREG) 12.5 MG tablet TAKE ONE TABLET BY MOUTH  EVERY MORNING and TAKE ONE TABLET BY MOUTH EVERYDAY AT BEDTIME   ezetimibe (ZETIA) 10 MG tablet Take 1 tablet (10 mg total) by mouth every morning.   gabapentin (NEURONTIN) 400 MG capsule Take 1 capsule (400 mg total) by mouth 2 (two) times daily.   leflunomide (ARAVA) 20 MG tablet Take 20 mg by mouth at bedtime.   LINZESS 145 MCG CAPS capsule Take 145 mcg by mouth daily before breakfast.   meloxicam (MOBIC) 15 MG tablet Take 15 mg by mouth at bedtime.   mirtazapine (REMERON) 15 MG tablet TAKE ONE TABLET BY MOUTH EVERYDAY AT BEDTIME   montelukast (SINGULAIR) 10 MG tablet Take 1 tablet (10 mg total) by mouth at bedtime.   Multiple Vitamin (MULTIVITAMIN WITH MINERALS) TABS tablet Take 1 tablet by mouth daily.   Nebulizer MISC Compressor nebulizer to use with nebulized medications as instructed   omeprazole (PRILOSEC) 40 MG capsule Take 1 capsule (40 mg total) by mouth 2 (two) times daily.   oxyCODONE-acetaminophen (PERCOCET) 5-325 MG tablet Take 1 tablet by mouth every 8 (eight) hours as needed for severe pain. Must last 30 days.   [START ON 09/19/2022] oxyCODONE-acetaminophen (PERCOCET) 5-325 MG tablet Take 1 tablet by mouth every 8 (eight) hours as needed for severe pain. Must last 30 days.   potassium chloride SA (KLOR-CON M) 20 MEQ tablet Take 1 tablet (20 mEq total) by mouth daily with breakfast.   SPIKEVAX syringe    topiramate (TOPAMAX) 25 MG tablet Take 1 tablet (25 mg total) by mouth 2 (two) times daily.   valACYclovir (VALTREX) 1000 MG tablet TAKE TWO TABLETS BY MOUTH TWICE DAILY AS NEEDED FOR 5 DAYS FOR FEVER blisters  venlafaxine XR (EFFEXOR-XR) 75 MG 24 hr capsule Take 3 capsules (225 mg total) by mouth daily with breakfast. TAKE 3 CAPSULES BY MOUTH ONCE DAILY WITH BREAKFAST   XIIDRA 5 % SOLN Place 1 drop into both eyes daily.   No facility-administered encounter medications on file as of 09/14/2022.   BP Readings from Last 3 Encounters:  09/11/22 (!) 156/78  08/19/22 (!) 144/86   07/30/22 130/65    Pulse Readings from Last 3 Encounters:  09/11/22 70  08/19/22 95  07/30/22 69    Lab Results  Component Value Date/Time   HGBA1C 5.3 06/26/2021 09:13 AM   HGBA1C 5.0 04/14/2018 11:24 AM   Lab Results  Component Value Date   CREATININE 1.00 09/11/2022   BUN 11 09/11/2022   GFRNONAA >60 09/11/2022   GFRAA 72 05/06/2020   NA 139 09/11/2022   K 4.1 09/11/2022   CALCIUM 8.8 (L) 09/11/2022   CO2 22 09/11/2022   Contacted patient to discuss medications and coordinate delivery from [place]. Spoke with patient on 09/14/2022   Cycle dispensing form sent to Clarita Leber, [facility] for review.   Last adherence delivery date: 08/25/2022      Patient is due for next adherence delivery on: 09/24/2022 [facility]  This delivery to include: Adherence Packaging  30 Days  Meloxicam 15 mg tablet- Take one tablet by mouth once daily (Bedtime) Ezetimibe (Zetia) 10 mg tablet Take one tablet by mouth daily (Breakfast) Atorvastatin 40 mg tablet- Take one tablet by mouth daily (Breakfast) Carvedilol 12.5 mg tablet - Take on tablet by mouth twice daily (Breakfast, Bedtime) Mirtazapine 15 mg tablet-Take one tablet daily (Bedtime) Topiramate 25 mg tablet-Take one tablet by mouth twice daily (Breakfast, Bedtime) Gabapentin 400 mg tablet- Take one tablet by mouth twice daily (Breakfast, Bedtime) Venlafaxine ER 75 mg capsule- Take three capsules by mouth daily (Breakfast) Omeprazole 40 mg- Take one capsule by mouth two times a day (Breakfast, Bedtime) Potassium 20 mEq - Take one capsule daily (Breakfast) Aripiprazole 5 mg tablet- Take one tablet by mouth daily (Breakfast) Leflunomide 20 mg tablet- Take one tablet by mouth once daily (Bedtime) Montelukast (Singulair) 10 mg tablet 1 tablet daily (Bedtime)  Patient declined the following medications this month: No medications were declined  Refills requested from providers include: Leflunomide 20 mg 1 tablet daily. Patient  receives this medication from a specialist and [facility] will send refill request.  Confirmed delivery date of 09/24/2022 1st Route, advised patient that pharmacy will contact them the morning of delivery.  Any concerns about your medications? No  How often do you forget or accidentally miss a dose? Never  Do you use a pillbox? No  Is patient in packaging Yes   Recent blood pressure readings are as follows:156/78  I spoke to the patient and she reports that she is feeling much better today. Patient denies any ill symptoms at this time.   Lynann Bologna, CPA/CMA Clinical Pharmacist Assistant Phone: [phone]

## 2022-09-15 ENCOUNTER — Telehealth: Payer: Self-pay | Admitting: Student in an Organized Health Care Education/Training Program

## 2022-09-15 ENCOUNTER — Encounter: Payer: Medicare Other | Admitting: *Deleted

## 2022-09-15 DIAGNOSIS — J449 Chronic obstructive pulmonary disease, unspecified: Secondary | ICD-10-CM | POA: Diagnosis not present

## 2022-09-15 DIAGNOSIS — Z5189 Encounter for other specified aftercare: Secondary | ICD-10-CM | POA: Diagnosis not present

## 2022-09-15 NOTE — Telephone Encounter (Signed)
LM that she has a script that can be picked up on 2-3.

## 2022-09-15 NOTE — Telephone Encounter (Signed)
PT stated that she hadn't gotta refill on percocet for Jan when she called pharmacy , she was told that she need an active prescription. Please give patient a call. Thanks

## 2022-09-15 NOTE — Progress Notes (Signed)
Daily Session Note  Patient Details  Name: Gabrielle Pineda MRN: [id number] Date of Birth: [birth year] Referring Provider:   Flowsheet Row Pulmonary Rehab from 07/27/2022 in [facility] Cardiac and Pulmonary Rehab  Referring Provider [name] MD       Encounter Date: 09/15/2022  Check In:  Session Check In - 09/15/22 1007       Check-In   Supervising physician immediately available to respond to emergencies See telemetry face sheet for immediately available ER MD    Location [facility]-Cardiac & Pulmonary Rehab    Staff Present Heath Lark, RN, BSN, CCRP;Jessica [place], MA, RCEP, CCRP, CCET;Noah Tickle, BS, Exercise Physiologist;Kara Maricela Bo, MS, ACSM CEP, Exercise Physiologist    Virtual Visit No    Medication changes reported     No    Fall or balance concerns reported    No    Warm-up and Cool-down Performed on first and last piece of equipment    Resistance Training Performed Yes    VAD Patient? No    PAD/SET Patient? No      Pain Assessment   Currently in Pain? No/denies                Social History   Tobacco Use  Smoking Status Former   Packs/day: 2.00   Years: 40.00   Total pack years: 80.00   Types: Cigarettes   Quit date: 10/30/2020   Years since quitting: 1.8  Smokeless Tobacco Never    Goals Met:  Proper associated with RPD/PD & O2 Sat Independence with exercise equipment Exercise tolerated well No report of concerns or symptoms today  Goals Unmet:  Not Applicable  Comments: Pt able to follow exercise prescription today without complaint.  Will continue to monitor for progression.    Dr. Emily Filbert is Medical Director for [place].  Dr. Ottie Glazier is Medical Director for [facility] Pulmonary Rehabilitation.

## 2022-09-17 ENCOUNTER — Encounter: Payer: Medicare Other | Attending: Physician Assistant | Admitting: *Deleted

## 2022-09-17 DIAGNOSIS — J449 Chronic obstructive pulmonary disease, unspecified: Secondary | ICD-10-CM | POA: Diagnosis not present

## 2022-09-17 NOTE — Progress Notes (Signed)
Daily Session Note  Patient Details  Name: Gabrielle Pineda MRN: [id number] Date of Birth: [birth year] Referring Provider:   Flowsheet Row Pulmonary Rehab from 07/27/2022 in [facility] Cardiac and Pulmonary Rehab  Referring Provider [name] MD       Encounter Date: 09/17/2022  Check In:  Session Check In - 09/17/22 0927       Check-In   Supervising physician immediately available to respond to emergencies See telemetry face sheet for immediately available ER MD    Location [facility]-Cardiac & Pulmonary Rehab    Staff Present Darlyne Russian, RN, Lorin Mercy, MS, ACSM CEP, Exercise Physiologist;Joseph Tessie Fass, [place]    Virtual Visit No    Medication changes reported     No    Fall or balance concerns reported    No    Warm-up and Cool-down Performed on first and last piece of equipment    Resistance Training Performed Yes    VAD Patient? No    PAD/SET Patient? No      Pain Assessment   Currently in Pain? No/denies                Social History   Tobacco Use  Smoking Status Former   Packs/day: 2.00   Years: 40.00   Total pack years: 80.00   Types: Cigarettes   Quit date: 10/30/2020   Years since quitting: 1.8  Smokeless Tobacco Never    Goals Met:  Independence with exercise equipment Exercise tolerated well No report of concerns or symptoms today Strength training completed today  Goals Unmet:  Not Applicable  Comments: Pt able to follow exercise prescription today without complaint.  Will continue to monitor for progression.    Dr. Emily Filbert is Medical Director for [place].  Dr. Ottie Glazier is Medical Director for [facility] Pulmonary Rehabilitation.

## 2022-09-22 ENCOUNTER — Encounter: Payer: Medicare Other | Admitting: *Deleted

## 2022-09-22 DIAGNOSIS — J449 Chronic obstructive pulmonary disease, unspecified: Secondary | ICD-10-CM | POA: Diagnosis not present

## 2022-09-22 NOTE — Progress Notes (Signed)
Daily Session Note  Patient Details  Name: Gabrielle Pineda MRN: [id number] Date of Birth: 1956/05/29 Referring Provider:   Flowsheet Row Pulmonary Rehab from 07/27/2022 in [facility] Cardiac and Pulmonary Rehab  Referring Provider [name] MD       Encounter Date: 09/22/2022  Check In:  Session Check In - 09/22/22 1009       Check-In   Supervising physician immediately available to respond to emergencies See telemetry face sheet for immediately available ER MD    Location [facility]-Cardiac & Pulmonary Rehab    Staff Present Heath Lark, RN, BSN, CCRP;Jessica [place], MA, RCEP, CCRP, Bertram Gala, MS, ACSM CEP, Exercise Physiologist    Virtual Visit No    Medication changes reported     No    Fall or balance concerns reported    No    Warm-up and Cool-down Performed on first and last piece of equipment    Resistance Training Performed Yes    VAD Patient? No    PAD/SET Patient? No      Pain Assessment   Currently in Pain? No/denies               Exercise Prescription Changes - 09/22/22 0900       Home Exercise Plan   Plans to continue exercise at Home (comment)   walking   Frequency Add 2 additional days to program exercise sessions.    Initial Home Exercises Provided 09/22/22             Social History   Tobacco Use  Smoking Status Former   Packs/day: 2.00   Years: 40.00   Total pack years: 80.00   Types: Cigarettes   Quit date: 10/30/2020   Years since quitting: 1.8  Smokeless Tobacco Never    Goals Met:  Proper associated with RPD/PD & O2 Sat Independence with exercise equipment Exercise tolerated well No report of concerns or symptoms today  Goals Unmet:  Not Applicable  Comments: Pt able to follow exercise prescription today without complaint.  Will continue to monitor for progression.    Dr. Emily Filbert is Medical Director for [place].  Dr. Ottie Glazier is Medical Director for [facility] Pulmonary  Rehabilitation.

## 2022-09-24 ENCOUNTER — Encounter: Payer: Medicare Other | Admitting: *Deleted

## 2022-09-24 DIAGNOSIS — J449 Chronic obstructive pulmonary disease, unspecified: Secondary | ICD-10-CM

## 2022-09-24 NOTE — Progress Notes (Signed)
Daily Session Note  Patient Details  Name: Gabrielle Pineda MRN: [id number] Date of Birth: September 19, 1955 Referring Provider:   Flowsheet Row Pulmonary Rehab from 07/27/2022 in [facility] Cardiac and Pulmonary Rehab  Referring Provider [name] MD       Encounter Date: 09/24/2022  Check In:  Session Check In - 09/24/22 0923       Check-In   Supervising physician immediately available to respond to emergencies See telemetry face sheet for immediately available ER MD    Location [facility]-Cardiac & Pulmonary Rehab    Staff Present Darlyne Russian, RN, Lorin Mercy, MS, ACSM CEP, Exercise Physiologist;Noah Tickle, BS, Exercise Physiologist    Virtual Visit No    Medication changes reported     No    Fall or balance concerns reported    No    Warm-up and Cool-down Performed on first and last piece of equipment    Resistance Training Performed Yes    VAD Patient? No    PAD/SET Patient? No      Pain Assessment   Currently in Pain? No/denies                Social History   Tobacco Use  Smoking Status Former   Packs/day: 2.00   Years: 40.00   Total pack years: 80.00   Types: Cigarettes   Quit date: 10/30/2020   Years since quitting: 1.9  Smokeless Tobacco Never    Goals Met:  Independence with exercise equipment Exercise tolerated well No report of concerns or symptoms today Strength training completed today  Goals Unmet:  Not Applicable  Comments: Pt able to follow exercise prescription today without complaint.  Will continue to monitor for progression.    Dr. Emily Filbert is Medical Director for [place].  Dr. Ottie Glazier is Medical Director for [facility] Pulmonary Rehabilitation.

## 2022-09-29 ENCOUNTER — Encounter: Payer: Medicare Other | Admitting: *Deleted

## 2022-09-29 ENCOUNTER — Other Ambulatory Visit: Payer: Self-pay | Admitting: *Deleted

## 2022-09-29 ENCOUNTER — Telehealth: Payer: Self-pay | Admitting: Student in an Organized Health Care Education/Training Program

## 2022-09-29 DIAGNOSIS — J449 Chronic obstructive pulmonary disease, unspecified: Secondary | ICD-10-CM

## 2022-09-29 DIAGNOSIS — G894 Chronic pain syndrome: Secondary | ICD-10-CM

## 2022-09-29 MED ORDER — OXYCODONE-ACETAMINOPHEN 5-325 MG PO TABS: 1.0000 | ORAL_TABLET | Freq: Three times a day (TID) | ORAL | 0 refills | Status: DC | PRN

## 2022-09-29 NOTE — Telephone Encounter (Signed)
PT stated that she never pick up prescription for oxycodone in Jan. PT stated that she was tols by nurse that she will be able to pick up medication on 09-19-22. PT stated that when she call over the weekend was told that she didn't have prescription to be refilled. Please give patient a call. TY

## 2022-09-29 NOTE — Progress Notes (Signed)
Daily Session Note  Patient Details  Name: Gabrielle Pineda MRN: [id number] Date of Birth: 20-Dec-1955 Referring Provider:   Flowsheet Row Pulmonary Rehab from 07/27/2022 in [facility] Cardiac and Pulmonary Rehab  Referring Provider [name] MD       Encounter Date: 09/29/2022  Check In:  Session Check In - 09/29/22 1007       Check-In   Supervising physician immediately available to respond to emergencies See telemetry face sheet for immediately available ER MD    Location [facility]-Cardiac & Pulmonary Rehab    Staff Present Heath Lark, RN, BSN, CCRP;Jessica [place], MA, RCEP, CCRP, Bertram Gala, MS, ACSM CEP, Exercise Physiologist    Virtual Visit No    Medication changes reported     No    Fall or balance concerns reported    No    Warm-up and Cool-down Performed on first and last piece of equipment    Resistance Training Performed Yes    VAD Patient? No    PAD/SET Patient? No      Pain Assessment   Currently in Pain? No/denies                Social History   Tobacco Use  Smoking Status Former   Packs/day: 2.00   Years: 40.00   Total pack years: 80.00   Types: Cigarettes   Quit date: 10/30/2020   Years since quitting: 1.9  Smokeless Tobacco Never    Goals Met:  Proper associated with RPD/PD & O2 Sat Independence with exercise equipment Exercise tolerated well No report of concerns or symptoms today  Goals Unmet:  Not Applicable  Comments: Pt able to follow exercise prescription today without complaint.  Will continue to monitor for progression.    Dr. Emily Filbert is Medical Director for [place].  Dr. Ottie Glazier is Medical Director for [facility] Pulmonary Rehabilitation.

## 2022-09-29 NOTE — Telephone Encounter (Signed)
Rx request sent to Dr. Lateef 

## 2022-10-01 ENCOUNTER — Encounter: Payer: Medicare Other | Admitting: *Deleted

## 2022-10-01 DIAGNOSIS — J449 Chronic obstructive pulmonary disease, unspecified: Secondary | ICD-10-CM

## 2022-10-01 NOTE — Progress Notes (Signed)
Daily Session Note  Patient Details  Name: Gabrielle Pineda MRN: [id number] Date of Birth: [birth year] Referring Provider:   Flowsheet Row Pulmonary Rehab from 07/27/2022 in [facility] Cardiac and Pulmonary Rehab  Referring Provider [name] MD       Encounter Date: 10/01/2022  Check In:  Session Check In - 10/01/22 0938       Check-In   Supervising physician immediately available to respond to emergencies See telemetry face sheet for immediately available ER MD    Location [facility]-Cardiac & Pulmonary Rehab    Staff Present Darlyne Russian, RN, ADN;Jessica Luan Pulling, MA, RCEP, CCRP, Bertram Gala, MS, ACSM CEP, Exercise Physiologist    Virtual Visit No    Medication changes reported     No    Fall or balance concerns reported    No    Warm-up and Cool-down Performed on first and last piece of equipment    Resistance Training Performed Yes    VAD Patient? No    PAD/SET Patient? No      Pain Assessment   Currently in Pain? No/denies                Social History   Tobacco Use  Smoking Status Former   Packs/day: 2.00   Years: 40.00   Total pack years: 80.00   Types: Cigarettes   Quit date: 10/30/2020   Years since quitting: 1.9  Smokeless Tobacco Never    Goals Met:  Independence with exercise equipment Exercise tolerated well No report of concerns or symptoms today Strength training completed today  Goals Unmet:  Not Applicable  Comments: Pt able to follow exercise prescription today without complaint.  Will continue to monitor for progression.    Dr. Emily Filbert is Medical Director for [place].  Dr. Ottie Glazier is Medical Director for [facility] Pulmonary Rehabilitation.

## 2022-10-06 ENCOUNTER — Ambulatory Visit
Payer: Medicare Other | Attending: Student in an Organized Health Care Education/Training Program | Admitting: Student in an Organized Health Care Education/Training Program

## 2022-10-06 ENCOUNTER — Encounter: Payer: Medicare Other | Admitting: *Deleted

## 2022-10-06 ENCOUNTER — Encounter: Payer: Self-pay | Admitting: Student in an Organized Health Care Education/Training Program

## 2022-10-06 VITALS — BP 146/83 | HR 74 | Temp 97.2°F | Ht 69.0 in | Wt 170.0 lb

## 2022-10-06 DIAGNOSIS — M5416 Radiculopathy, lumbar region: Secondary | ICD-10-CM | POA: Diagnosis not present

## 2022-10-06 DIAGNOSIS — M5030 Other cervical disc degeneration, unspecified cervical region: Secondary | ICD-10-CM

## 2022-10-06 DIAGNOSIS — G894 Chronic pain syndrome: Secondary | ICD-10-CM

## 2022-10-06 DIAGNOSIS — G8929 Other chronic pain: Secondary | ICD-10-CM | POA: Diagnosis not present

## 2022-10-06 DIAGNOSIS — J449 Chronic obstructive pulmonary disease, unspecified: Secondary | ICD-10-CM

## 2022-10-06 DIAGNOSIS — G629 Polyneuropathy, unspecified: Secondary | ICD-10-CM | POA: Diagnosis not present

## 2022-10-06 DIAGNOSIS — M961 Postlaminectomy syndrome, not elsewhere classified: Secondary | ICD-10-CM | POA: Diagnosis not present

## 2022-10-06 MED ORDER — OXYCODONE-ACETAMINOPHEN 5-325 MG PO TABS: 1.0000 | ORAL_TABLET | Freq: Three times a day (TID) | ORAL | 0 refills | Status: DC | PRN

## 2022-10-06 MED ORDER — OXYCODONE-ACETAMINOPHEN 5-325 MG PO TABS: 1.0000 | ORAL_TABLET | Freq: Three times a day (TID) | ORAL | 0 refills | Status: AC | PRN

## 2022-10-06 NOTE — Progress Notes (Signed)
Daily Session Note  Patient Details  Name: Gabrielle Pineda MRN: [id number] Date of Birth: June 29, 1956 Referring Provider:   Flowsheet Row Pulmonary Rehab from 07/27/2022 in [facility] Cardiac and Pulmonary Rehab  Referring Provider [name] MD       Encounter Date: 10/06/2022  Check In:  Session Check In - 10/06/22 0945       Check-In   Supervising physician immediately available to respond to emergencies See telemetry face sheet for immediately available ER MD    Location [facility]-Cardiac & Pulmonary Rehab    Staff Present Nyoka Cowden, RN, BSN, Melina Schools, MS, ACSM CEP, Exercise Physiologist;Nikolay Demetriou Luan Pulling, MA, RCEP, CCRP, CCET    Virtual Visit No    Medication changes reported     No    Fall or balance concerns reported    No    Tobacco Cessation No Change    Warm-up and Cool-down Performed on first and last piece of equipment    Resistance Training Performed Yes    VAD Patient? No    PAD/SET Patient? No      Pain Assessment   Currently in Pain? No/denies    Multiple Pain Sites No                Social History   Tobacco Use  Smoking Status Former   Packs/day: 2.00   Years: 40.00   Total pack years: 80.00   Types: Cigarettes   Quit date: 10/30/2020   Years since quitting: 1.9  Smokeless Tobacco Never    Goals Met:  Independence with exercise equipment Exercise tolerated well No report of concerns or symptoms today  Goals Unmet:  Not Applicable  Comments: Pt able to follow exercise prescription today without complaint.  Will continue to monitor for progression.    Dr. Emily Filbert is Medical Director for [place].  Dr. Ottie Glazier is Medical Director for [facility] Pulmonary Rehabilitation.

## 2022-10-06 NOTE — Progress Notes (Signed)
Nursing Pain Medication Assessment:  Safety precautions to be maintained throughout the outpatient stay will include: orient to surroundings, keep bed in low position, maintain call bell within reach at all times, provide assistance with transfer out of bed and ambulation.  Medication Inspection Compliance: Pill count conducted under aseptic conditions, in front of the patient. Neither the pills nor the bottle was removed from the patient's sight at any time. Once count was completed pills were immediately returned to the patient in their original bottle.  Medication: Oxycodone/APAP Pill/Patch Count:  72 of 90 pills remain Pill/Patch Appearance: Markings consistent with prescribed medication Bottle Appearance: Standard pharmacy container. Clearly labeled. Filled Date: 2 / 14 / 2024 Last Medication intake:  TodaySafety precautions to be maintained throughout the outpatient stay will include: orient to surroundings, keep bed in low position, maintain call bell within reach at all times, provide assistance with transfer out of bed and ambulation.

## 2022-10-06 NOTE — Progress Notes (Signed)
PROVIDER NOTE: Information contained herein reflects review and annotations entered in association with encounter. Interpretation of such information and data should be left to medically-trained personnel. Information provided to patient can be located elsewhere in the medical record under "Patient Instructions". Document created using STT-dictation technology, any transcriptional errors that may result from process are unintentional.    Patient: Gabrielle Pineda  Service Category: E/M  Provider: [name], MD  DOB: 1955/08/27  DOS: 10/06/2022  Specialty: Interventional Pain Management  MRN: [id number]  Setting: Ambulatory outpatient  PCP: Mikey Kirschner, PA-C  Type: Established Patient    Referring Provider: Mikey Kirschner, PA-C  Location: Office  Delivery: Face-to-face     HPI  Gabrielle Pineda, a 67 y.o. year old female, is here today because of her Chronic pain syndrome [G89.4]. Ms. Horak primary complain today is cervical spine pain, low back pain, hip pain  Last encounter: My last encounter with her was on 06/30/22   Pertinent problems: Ms. Sampat has Closed compression fracture of L5 lumbar vertebra; Cervical radiculopathy; Neuropathy; Spinal stenosis, lumbar region, with neurogenic claudication; Spondylosis of cervical region without myelopathy or radiculopathy; Cervical fusion syndrome; Chronic pain syndrome; Postlaminectomy syndrome, lumbar region; Chronic radicular lumbar pain; and Status post total hip replacement, right on their pertinent problem list. Pain Assessment: Severity of Chronic pain is reported as a 8 /10. Location: Back Lower/denies. Onset: More than a month ago. Quality: Constant, Shooting, Stabbing. Timing: Constant. Modifying factor(s): rest, sitting down. Vitals:  height is 5' 9"$  (1.753 m) and weight is 170 lb (77.1 kg). Her temporal temperature is 97.2 F (36.2 C) (abnormal). Her blood pressure is 146/83 (abnormal) and her pulse is 74. Her oxygen  saturation is 100%.   Reason for encounter:   Patient is experiencing increased low back pain with radiation into her left leg. She is also having trouble walking due to pain and weakness with standing and moderate walking distance. She is not using her [employer] SCS due to difficulty charging. She states that it was helping before. She has a hx of lumbar spinal fusion. Discussed repeating L-MRI for diagnostic work up.   Pharmacotherapy Assessment  Analgesic: Percocet 5 mg TID PRN  Monitoring: [place] PMP: PDMP reviewed during this encounter.       Pharmacotherapy: No side-effects or adverse reactions reported. Compliance: No problems identified. Effectiveness: Clinically acceptable.  UDS:  Summary  Date Value Ref Range Status  10/21/2021 Note  Final    Comment:    ==================================================================== [employer] Select 13 (MW) ==================================================================== Test                             Result       Flag       Units  Drug Present and Declared for Prescription Verification   Oxycodone                      807          EXPECTED   ng/mg creat   Oxymorphone                    151          EXPECTED   ng/mg creat   Noroxycodone                   797          EXPECTED   ng/mg creat  Sources of oxycodone include scheduled prescription medications.    Oxymorphone and noroxycodone are expected metabolites of oxycodone.    Oxymorphone is also available as a scheduled prescription medication.  ==================================================================== Test                      Result    Flag   Units      Ref Range   Creatinine              71               mg/dL      >=20 ==================================================================== Declared Medications:  The flagging and interpretation on this report are based on the  following declared medications.  Unexpected results may arise from   inaccuracies in the declared medications.   **Note: The testing scope of this panel includes these medications:   Oxycodone (Percocet)   **Note: The testing scope of this panel does not include the  following reported medications:   Acetaminophen (Percocet)  Acyclovir (Zovirax)  Albuterol (Ventolin HFA)  Aripiprazole (Abilify)  Atorvastatin (Lipitor)  Buspirone (Buspar)  Carvedilol (Coreg)  Eszopiclone  Ezetimibe (Zetia)  Fluticasone (Trelegy)  Gabapentin (Neurontin)  Ipratropium (Atrovent)  Leflunomide (Arava)  Lifitegrast  Linaclotide (Linzess)  Meloxicam (Mobic)  Multivitamin  Omeprazole (Prilosec)  Potassium (Klor-Con)  Sulfamethoxazole (Bactrim)  Supplement  Topiramate (Topamax)  Trimethoprim (Bactrim)  Umeclidinium (Trelegy)  Valacyclovir (Valtrex)  Venlafaxine (Effexor)  Vilanterol (Trelegy)  Vitamin B12  Vitamin E ==================================================================== For clinical consultation, please call [phone]. ====================================================================        ROS  Constitutional: Denies any fever or chills Gastrointestinal: No reported hemesis, hematochezia, vomiting, or acute GI distress Musculoskeletal:  Low back pain with radiation into left leg Neurological: No reported episodes of acute onset apraxia, aphasia, dysarthria, agnosia, amnesia, paralysis, loss of coordination, or loss of consciousness  Medication Review  ARIPiprazole, Budeson-Glycopyrrol-Formoterol, COVID-19 mRNA vaccine 2023-2024, Lifitegrast, Nebulizer, acyclovir ointment, albuterol, atorvastatin, carvedilol, ezetimibe, gabapentin, leflunomide, linaclotide, meloxicam, mirtazapine, montelukast, multivitamin with minerals, omeprazole, oxyCODONE-acetaminophen, potassium chloride SA, topiramate, valACYclovir, and venlafaxine XR  History Review  Allergy: Ms. Brule is allergic to sulfasalazine and plaquenil [hydroxychloroquine]. Drug:  Ms. Capehart  reports no history of drug use. Alcohol:  reports no history of alcohol use. Tobacco:  reports that she quit smoking about 23 months ago. Her smoking use included cigarettes. She has a 80.00 pack-year smoking history. She has never used smokeless tobacco. Social: Ms. Laub  reports that she quit smoking about 23 months ago. Her smoking use included cigarettes. She has a 80.00 pack-year smoking history. She has never used smokeless tobacco. She reports that she does not drink alcohol and does not use drugs. Medical:  has a past medical history of Alcohol abuse, Anemia, Anxiety, Arthritis, Cervicalgia, Cirrhosis ([place]) (1994), COPD (chronic obstructive pulmonary disease) ([place]), Depression, Difficult intubation, Dyspnea, GERD (gastroesophageal reflux disease), Headache, Heart murmur, Hypertension, Other and unspecified hyperlipidemia, Status post insertion of spinal cord stimulator, and Tachycardia. Surgical: Ms. Elsayed  has a past surgical history that includes neck disc surgery; Breast enhancement surgery (1981); Esophagogastroduodenoscopy; Tonsillectomy and adenoidectomy (1973 ); Carpal tunnel release (11/11/2011); Carpal tunnel release (2013); Rotator cuff repair (06/16/2016); Rotator cuff repair; Colonoscopy with propofol (N/A, 11/01/2017); Augmentation mammaplasty (Bilateral, 1982); Anterior cervical decomp/discectomy fusion (2012, 2015, 2018); Lumbar laminectomy/decompression microdiscectomy (N/A, 08/15/2019); Spinal cord stimulator insertion (N/A, 03/17/2021); and Total hip arthroplasty (Right, 04/15/2021). Family: family history includes Alcohol abuse in her father, mother, and sister; Bipolar disorder in her  mother; Depression in her sister; Drug abuse in her sister; Pancreatic cancer in her father; Suicidality in her mother.  Laboratory Chemistry Profile   Renal Lab Results  Component Value Date   BUN 11 09/11/2022   CREATININE 1.00 09/11/2022   BCR 5 (L) 11/17/2021   GFRAA 72  05/06/2020   GFRNONAA >60 09/11/2022    Hepatic Lab Results  Component Value Date   AST 22 09/11/2022   ALT 17 09/11/2022   ALBUMIN 3.7 09/11/2022   ALKPHOS 99 09/11/2022   LIPASE 63 03/23/2018    Electrolytes Lab Results  Component Value Date   NA 139 09/11/2022   K 4.1 09/11/2022   CL 109 09/11/2022   CALCIUM 8.8 (L) 09/11/2022   PHOS 3.2 05/06/2020    Bone No results found for: "VD25OH", "VD125OH2TOT", "[id number]", "[id number]", "25OHVITD1", "25OHVITD2", "25OHVITD3", "TESTOFREE", "TESTOSTERONE"  Inflammation (CRP: Acute Phase) (ESR: Chronic Phase) No results found for: "CRP", "ESRSEDRATE", "LATICACIDVEN"       Note: Above Lab results reviewed.  Recent Imaging Review  DG Chest 2 View CLINICAL DATA:  COPD exacerbation  EXAM: CHEST - 2 VIEW  COMPARISON:  04/21/22 CXR  FINDINGS: No pleural effusion. No pneumothorax. No focal airspace opacity. Cervical spinal fusion hardware in place. Spinal nerve stimulator leads in place. Vertebral body heights are maintained. Normal cardiac and mediastinal contours. No displaced rib fractures. Chronic fractures of left ribs 8 and 9. Visualized upper abdomen is unremarkable.  IMPRESSION: No active cardiopulmonary disease.  Electronically Signed   By: Marin Roberts M.D.   On: 08/19/2022 10:49 Note: Reviewed        Physical Exam  General appearance: Well nourished, well developed, and well hydrated. In no apparent acute distress Mental status: Alert, oriented x 3 (person, place, & time)       Respiratory: No evidence of acute respiratory distress Eyes: PERLA Vitals: BP (!) 146/83 (BP Location: Right Arm, Patient Position: Sitting, Cuff Size: Normal)   Pulse 74   Temp (!) 97.2 F (36.2 C) (Temporal)   Ht 5' 9"$  (1.753 m)   Wt 170 lb (77.1 kg)   SpO2 100%   BMI 25.10 kg/m  BMI: Estimated body mass index is 25.1 kg/m as calculated from the following:   Height as of this encounter: 5' 9"$  (1.753 m).   Weight as of this  encounter: 170 lb (77.1 kg). Ideal: Ideal body weight: 66.2 kg (145 lb 15.1 oz) Adjusted ideal body weight: 70.6 kg (155 lb 9.1 oz)    Lumbar Spine Area Exam  Skin & Axial Inspection: Well healed scar from previous spine surgery detected Alignment: Symmetrical Functional ROM: Pain restricted ROM affecting both sides, left> right   Gait & Posture Assessment  Ambulation: Limited Gait: Antalgic Posture: Difficulty standing up straight, due to pain   Lower Extremity Exam      Side: Right lower extremity   Side: Left lower extremity  Stability: No instability observed           Stability: No instability observed          Skin & Extremity Inspection: Skin color, temperature, and hair growth are WNL. No peripheral edema or cyanosis. No masses, redness, swelling, asymmetry, or associated skin lesions. No contractures.   Skin & Extremity Inspection: Skin color, temperature, and hair growth are WNL. No peripheral edema or cyanosis. No masses, redness, swelling, asymmetry, or associated skin lesions. No contractures.  Functional ROM: Pain restricted ROM for hip and knee joints Limited SLR (straight leg raise)  Functional ROM: Pain restricted ROM for hip and knee joints Limited SLR (straight leg raise)  Muscle Tone/Strength: Functionally intact. No obvious neuro-muscular anomalies detected.   Muscle Tone/Strength: Functionally intact. No obvious neuro-muscular anomalies detected.  Sensory (Neurological): Dermatomal pain pattern         Sensory (Neurological): Dermatomal pain pattern        DTR: Patellar: deferred today Achilles: deferred today Plantar: deferred today   DTR: Patellar: deferred today Achilles: deferred today Plantar: deferred today  Palpation: No palpable anomalies   Palpation: No palpable anomalies     Assessment   Status Diagnosis  Persistent Having a Flare-up Having a Flare-up 1. Chronic pain syndrome   2. Postlaminectomy syndrome, lumbar region   3. DDD (degenerative  disc disease), cervical   4. Lumbar radiculopathy (R>L)   5. Failed back surgical syndrome   6. Neuropathy   7. Chronic radicular lumbar pain         Plan of Care   1. Chronic pain syndrome - oxyCODONE-acetaminophen (PERCOCET) 5-325 MG tablet; Take 1 tablet by mouth every 8 (eight) hours as needed for severe pain. Must last 30 days.  Dispense: 90 tablet; Refill: 0 - oxyCODONE-acetaminophen (PERCOCET) 5-325 MG tablet; Take 1 tablet by mouth every 8 (eight) hours as needed for severe pain. Must last 30 days.  Dispense: 90 tablet; Refill: 0 - oxyCODONE-acetaminophen (PERCOCET) 5-325 MG tablet; Take 1 tablet by mouth every 8 (eight) hours as needed for severe pain. Must last 30 days.  Dispense: 90 tablet; Refill: 0 - MR LUMBAR SPINE WO CONTRAST; Future - [employer] Select 13 (MW), Urine  2. Postlaminectomy syndrome, lumbar region - MR LUMBAR SPINE WO CONTRAST; Future  3. DDD (degenerative disc disease), cervical  4. Lumbar radiculopathy (R>L) - MR LUMBAR SPINE WO CONTRAST; Future  5. Failed back surgical syndrome - MR LUMBAR SPINE WO CONTRAST; Future  6. Neuropathy  7. Chronic radicular lumbar pain - MR LUMBAR SPINE WO CONTRAST; Future    Pharmacotherapy (Medications Ordered): Meds ordered this encounter  Medications   oxyCODONE-acetaminophen (PERCOCET) 5-325 MG tablet    Sig: Take 1 tablet by mouth every 8 (eight) hours as needed for severe pain. Must last 30 days.    Dispense:  90 tablet    Refill:  0    Chronic Pain: STOP Act (Not applicable) Fill 1 day early if closed on refill date. Avoid benzodiazepines within 8 hours of opioids   oxyCODONE-acetaminophen (PERCOCET) 5-325 MG tablet    Sig: Take 1 tablet by mouth every 8 (eight) hours as needed for severe pain. Must last 30 days.    Dispense:  90 tablet    Refill:  0    Chronic Pain: STOP Act (Not applicable) Fill 1 day early if closed on refill date. Avoid benzodiazepines within 8 hours of opioids    oxyCODONE-acetaminophen (PERCOCET) 5-325 MG tablet    Sig: Take 1 tablet by mouth every 8 (eight) hours as needed for severe pain. Must last 30 days.    Dispense:  90 tablet    Refill:  0    Chronic Pain: STOP Act (Not applicable) Fill 1 day early if closed on refill date. Avoid benzodiazepines within 8 hours of opioids  Continue Gabapentin &  Topamax as prescribed, no refills needed Continue Mobic as needed for acute on chronic pain flare Discuss treatment plan with pt after MRI [employer] SCS rep to troubleshoot battery charging issue with pt Continue care with psychiatry UDS UTD  Follow-up plan:   Return in about 4 months (around 01/21/2023) for Medication Management, in person.    Recent Visits No visits were found meeting these conditions. Showing recent visits within past 90 days and meeting all other requirements Today's Visits Date Type Provider Dept  10/06/22 Office Visit [name], MD [facility]  Showing today's visits and meeting all other requirements Future Appointments No visits were found meeting these conditions. Showing future appointments within next 90 days and meeting all other requirements  I discussed the assessment and treatment plan with the patient. The patient was provided an opportunity to ask questions and all were answered. The patient agreed with the plan and demonstrated an understanding of the instructions.  Patient advised to call back or seek an in-person evaluation if the symptoms or condition worsens.  Duration of encounter: 62mnutes.  Note by: BGillis Santa MD Date: 10/06/2022; Time: 8:45 AM

## 2022-10-07 ENCOUNTER — Encounter: Payer: Self-pay | Admitting: *Deleted

## 2022-10-07 DIAGNOSIS — J449 Chronic obstructive pulmonary disease, unspecified: Secondary | ICD-10-CM

## 2022-10-07 NOTE — Progress Notes (Signed)
Pulmonary Individual Treatment Plan  Patient Details  Name: Gabrielle Pineda MRN: [id number] Date of Birth: [birth year] Referring Provider:   Flowsheet Row Pulmonary Rehab from 07/27/2022 in [facility] Cardiac and Pulmonary Rehab  Referring Provider [name] MD       Initial Encounter Date:  Flowsheet Row Pulmonary Rehab from 07/27/2022 in [facility] Cardiac and Pulmonary Rehab  Date 07/27/22       Visit Diagnosis: Chronic obstructive pulmonary disease, unspecified COPD type ([place])  Patient's Home Medications on Admission:  Current Outpatient Medications:    acyclovir ointment (ZOVIRAX) 5 %, :Topical Every 3 Hours as needed for lesion, Disp: 30 g, Rfl: 1   albuterol (PROVENTIL) (2.5 MG/3ML) 0.083% nebulizer solution, Take 3 mLs (2.5 mg total) by nebulization every 4 (four) hours as needed for wheezing or shortness of breath., Disp: 225 mL, Rfl: 2   albuterol (VENTOLIN HFA) 108 (90 Base) MCG/ACT inhaler, INHALE TWO PUFFS BY MOUTH INTO LUNGS every SIX hours AS NEEDED FOR WHEEZING AND/OR SHORTNESS OF BREATH, Disp: 8.5 g, Rfl: 2   ARIPiprazole (ABILIFY) 5 MG tablet, Take 1 tablet (5 mg total) by mouth in the morning., Disp: 90 tablet, Rfl: 1   atorvastatin (LIPITOR) 40 MG tablet, Take 1 tablet (40 mg total) by mouth every morning., Disp: 90 tablet, Rfl: 3   Budeson-Glycopyrrol-Formoterol (BREZTRI AEROSPHERE) 160-9-4.8 MCG/ACT AERO, Inhale 2 puffs into the lungs in the morning and at bedtime., Disp: 10.7 g, Rfl: 11   carvedilol (COREG) 12.5 MG tablet, TAKE ONE TABLET BY MOUTH EVERY MORNING and TAKE ONE TABLET BY MOUTH EVERYDAY AT BEDTIME, Disp: 180 tablet, Rfl: 3   ezetimibe (ZETIA) 10 MG tablet, Take 1 tablet (10 mg total) by mouth every morning., Disp: 90 tablet, Rfl: 3   gabapentin (NEURONTIN) 400 MG capsule, Take 1 capsule (400 mg total) by mouth 2 (two) times daily., Disp: 60 capsule, Rfl: 5   leflunomide (ARAVA) 20 MG tablet, Take 20 mg by mouth at bedtime., Disp: , Rfl:    LINZESS 145  MCG CAPS capsule, Take 145 mcg by mouth daily before breakfast., Disp: , Rfl:    meloxicam (MOBIC) 15 MG tablet, Take 15 mg by mouth at bedtime., Disp: , Rfl:    mirtazapine (REMERON) 15 MG tablet, TAKE ONE TABLET BY MOUTH EVERYDAY AT BEDTIME, Disp: 90 tablet, Rfl: 0   montelukast (SINGULAIR) 10 MG tablet, Take 1 tablet (10 mg total) by mouth at bedtime., Disp: 30 tablet, Rfl: 11   Multiple Vitamin (MULTIVITAMIN WITH MINERALS) TABS tablet, Take 1 tablet by mouth daily., Disp: , Rfl:    Nebulizer MISC, Compressor nebulizer to use with nebulized medications as instructed, Disp: 1 each, Rfl: 0   omeprazole (PRILOSEC) 40 MG capsule, Take 1 capsule (40 mg total) by mouth 2 (two) times daily., Disp: 180 capsule, Rfl: 1   [START ON 10/30/2022] oxyCODONE-acetaminophen (PERCOCET) 5-325 MG tablet, Take 1 tablet by mouth every 8 (eight) hours as needed for severe pain. Must last 30 days., Disp: 90 tablet, Rfl: 0   [START ON 11/29/2022] oxyCODONE-acetaminophen (PERCOCET) 5-325 MG tablet, Take 1 tablet by mouth every 8 (eight) hours as needed for severe pain. Must last 30 days., Disp: 90 tablet, Rfl: 0   [START ON 12/29/2022] oxyCODONE-acetaminophen (PERCOCET) 5-325 MG tablet, Take 1 tablet by mouth every 8 (eight) hours as needed for severe pain. Must last 30 days., Disp: 90 tablet, Rfl: 0   potassium chloride SA (KLOR-CON M) 20 MEQ tablet, Take 1 tablet (20 mEq total) by mouth daily  with breakfast., Disp: 90 tablet, Rfl: 1   SPIKEVAX syringe, , Disp: , Rfl:    topiramate (TOPAMAX) 25 MG tablet, Take 1 tablet (25 mg total) by mouth 2 (two) times daily., Disp: 60 tablet, Rfl: 11   valACYclovir (VALTREX) 1000 MG tablet, TAKE TWO TABLETS BY MOUTH TWICE DAILY AS NEEDED FOR 5 DAYS FOR FEVER blisters, Disp: 30 tablet, Rfl: 1   venlafaxine XR (EFFEXOR-XR) 75 MG 24 hr capsule, Take 3 capsules (225 mg total) by mouth daily with breakfast. TAKE 3 CAPSULES BY MOUTH ONCE DAILY WITH BREAKFAST, Disp: 270 capsule, Rfl: 1   XIIDRA  5 % SOLN, Place 1 drop into both eyes daily., Disp: , Rfl:   Past Medical History: Past Medical History:  Diagnosis Date   Alcohol abuse    Anemia    Anxiety    Arthritis    Cervicalgia    Cirrhosis ([place]) 1994   COPD (chronic obstructive pulmonary disease) ([place])    Depression    Difficult intubation    has plates and screws in neck   Dyspnea    GERD (gastroesophageal reflux disease)    Headache    Heart murmur    on heard when pt is lying   Hypertension    Other and unspecified hyperlipidemia    Status post insertion of spinal cord stimulator    Tachycardia    d/t questionable anxiety happens every 5- 10 years, sees [facility]    Tobacco Use: Social History   Tobacco Use  Smoking Status Former   Packs/day: 2.00   Years: 40.00   Total pack years: 80.00   Types: Cigarettes   Quit date: 10/30/2020   Years since quitting: 1.9  Smokeless Tobacco Never    Labs: Review Flowsheet  More data exists      Latest Ref Rng & Units 04/17/2019 04/19/2020 03/17/2021 06/26/2021 11/17/2021  Labs for ITP Cardiac and Pulmonary Rehab  Cholestrol 100 - 199 mg/dL 158  172  - 120  153   LDL (calc) 0 - 99 mg/dL 80  98  - 50  79   HDL-C >39 mg/dL 54  56  - 39  40   Trlycerides 0 - 149 mg/dL 135  102  - 190  202   Hemoglobin A1c 4.8 - 5.6 % - - - 5.3  -  TCO2 22 - 32 mmol/L - - 21  - -     Pulmonary Assessment Scores:  Pulmonary Assessment Scores     Row Name 07/27/22 1435         ADL UCSD   ADL Phase Entry     SOB Score total 71     Rest 0     Walk 2     Stairs 5     Bath 2     Dress 3     Shop 4       CAT Score   CAT Score 17       mMRC Score   mMRC Score 2              UCSD: Self-administered rating of dyspnea associated with activities of daily living (ADLs) 6-point scale (0 = "not at all" to 5 = "maximal or unable to do because of breathlessness")  Scoring Scores range from 0 to 120.  Minimally important difference is 5 units  CAT: CAT can identify  the health impairment of COPD patients and is better correlated with disease progression.  CAT has a scoring range  of zero to 40. The CAT score is classified into four groups of low (less than 10), medium (10 - 20), high (21-30) and very high (31-40) based on the impact level of disease on health status. A CAT score over 10 suggests significant symptoms.  A worsening CAT score could be explained by an exacerbation, poor medication adherence, poor inhaler technique, or progression of COPD or comorbid conditions.  CAT MCID is 2 points  mMRC: mMRC (Modified Medical Research Council) Dyspnea Scale is used to assess the degree of baseline functional disability in patients of respiratory disease due to dyspnea. No minimal important difference is established. A decrease in score of 1 point or greater is considered a positive change.   Pulmonary Function Assessment:  Pulmonary Function Assessment - 07/27/22 1433       Pulmonary Function Tests   FVC% 64 %   Collected 02/04/22   FEV1% 54 %    FEV1/FVC Ratio 64    RV% 144 %    DLCO% 51 %      Breath   Shortness of Breath Yes;Limiting activity;Fear of Shortness of Breath             Exercise Target Goals: Exercise Program Goal: Individual exercise prescription set using results from initial 6 min walk test and THRR while considering  patient's activity barriers and safety.   Exercise Prescription Goal: Initial exercise prescription builds to 30-45 minutes a day of aerobic activity, 2-3 days per week.  Home exercise guidelines will be given to patient during program as part of exercise prescription that the participant will acknowledge.  Education: Aerobic Exercise: - Group verbal and visual presentation on the components of exercise prescription. Introduces F.I.T.T principle from ACSM for exercise prescriptions.  Reviews F.I.T.T. principles of aerobic exercise including progression. Written material given at graduation. Flowsheet Row  Pulmonary Rehab from 10/01/2022 in [facility] Cardiac and Pulmonary Rehab  Date 09/03/22  Educator [facility]  Instruction Review Code 1- Verbalizes Understanding       Education: Resistance Exercise: - Group verbal and visual presentation on the components of exercise prescription. Introduces F.I.T.T principle from ACSM for exercise prescriptions  Reviews F.I.T.T. principles of resistance exercise including progression. Written material given at graduation. Flowsheet Row Pulmonary Rehab from 10/01/2022 in [facility] Cardiac and Pulmonary Rehab  Date 09/10/22  Educator [facility]  Instruction Review Code 1- Verbalizes Understanding        Education: Exercise & Equipment Safety: - Individual verbal instruction and demonstration of equipment use and safety with use of the equipment. Flowsheet Row Pulmonary Rehab from 10/01/2022 in [facility] Cardiac and Pulmonary Rehab  Date 07/16/22  Educator [facility]  Instruction Review Code 1- Verbalizes Understanding       Education: Exercise Physiology & General Exercise Guidelines: - Group verbal and written instruction with models to review the exercise physiology of the cardiovascular system and associated critical values. Provides general exercise guidelines with specific guidelines to those with heart or lung disease.    Education: Flexibility, Balance, Mind/Body Relaxation: - Group verbal and visual presentation with interactive activity on the components of exercise prescription. Introduces F.I.T.T principle from ACSM for exercise prescriptions. Reviews F.I.T.T. principles of flexibility and balance exercise training including progression. Also discusses the mind body connection.  Reviews various relaxation techniques to help reduce and manage stress (i.e. Deep breathing, progressive muscle relaxation, and visualization). Balance handout provided to take home. Written material given at graduation. Flowsheet Row Pulmonary Rehab from 10/01/2022 in [facility] Cardiac and Pulmonary Rehab   Date  09/17/22  Educator [place]  Instruction Review Code 1- Verbalizes Understanding       Activity Barriers & Risk Stratification:   6 Minute Walk:  6 Minute Walk     Row Name 07/27/22 1425         6 Minute Walk   Phase Initial     Distance 1035 feet     Walk Time 6 minutes     # of Rest Breaks 0     MPH 1.96     METS 3.24     RPE 14     Perceived Dyspnea  2     VO2 Peak 11.33     Symptoms Yes (comment)     Comments Hip pain 6/10     Resting HR 70 bpm     Resting BP 126/70     Resting Oxygen Saturation  94 %     Exercise Oxygen Saturation  during 6 min walk 92 %     Max Ex. HR 110 bpm     Max Ex. BP 184/74     2 Minute Post BP 154/72       Interval HR   1 Minute HR 96     2 Minute HR 100     3 Minute HR 99     4 Minute HR 97     5 Minute HR 103     6 Minute HR 110     2 Minute Post HR 77     Interval Heart Rate? Yes       Interval Oxygen   Interval Oxygen? Yes     Baseline Oxygen Saturation % 94 %     1 Minute Oxygen Saturation % 93 %     1 Minute Liters of Oxygen 0 L  Room Air     2 Minute Oxygen Saturation % 92 %     2 Minute Liters of Oxygen 0 L     3 Minute Oxygen Saturation % 93 %     3 Minute Liters of Oxygen 0 L     4 Minute Oxygen Saturation % 93 %     4 Minute Liters of Oxygen 0 L     5 Minute Oxygen Saturation % 93 %     5 Minute Liters of Oxygen 0 L     6 Minute Oxygen Saturation % 93 %     6 Minute Liters of Oxygen 0 L     2 Minute Post Oxygen Saturation % 94 %     2 Minute Post Liters of Oxygen 0 L             Oxygen Initial Assessment:  Oxygen Initial Assessment - 07/16/22 0910       Home Oxygen   Home Oxygen Device Home Concentrator    Sleep Oxygen Prescription Continuous    Liters per minute 2    Home Exercise Oxygen Prescription None    Home Resting Oxygen Prescription None    Compliance with Home Oxygen Use Yes      Initial 6 min Walk   Oxygen Used None      Program Oxygen Prescription   Program Oxygen Prescription  None      Intervention   Short Term Goals To learn and exhibit compliance with exercise, home and travel O2 prescription;To learn and understand importance of maintaining oxygen saturations>88%;To learn and demonstrate proper pursed lip breathing techniques or other breathing techniques. ;To learn and understand importance of monitoring SPO2  with pulse oximeter and demonstrate accurate use of the pulse oximeter.;To learn and demonstrate proper use of respiratory medications    Long  Term Goals Exhibits compliance with exercise, home  and travel O2 prescription;Maintenance of O2 saturations>88%;Compliance with respiratory medication;Demonstrates proper use of MDI's;Exhibits proper breathing techniques, such as pursed lip breathing or other method taught during program session;Verbalizes importance of monitoring SPO2 with pulse oximeter and return demonstration             Oxygen Re-Evaluation:  Oxygen Re-Evaluation     Row Name 08/04/22 0953 09/01/22 0945 09/17/22 1204         Program Oxygen Prescription   Program Oxygen Prescription Continuous Continuous Continuous     Liters per minute 2 2 2     $ Comments sleep only sleep only sleep only       Home Oxygen   Home Oxygen Device Home Concentrator Home Concentrator Home Concentrator     Sleep Oxygen Prescription Continuous Continuous Continuous     Liters per minute 2 2 2     $ Home Exercise Oxygen Prescription None None None     Home Resting Oxygen Prescription None None None     Compliance with Home Oxygen Use Yes Yes Yes       Goals/Expected Outcomes   Short Term Goals To learn and demonstrate proper pursed lip breathing techniques or other breathing techniques.  To learn and demonstrate proper pursed lip breathing techniques or other breathing techniques.  To learn and demonstrate proper pursed lip breathing techniques or other breathing techniques.      Long  Term Goals Exhibits proper breathing techniques, such as pursed lip  breathing or other method taught during program session Exhibits proper breathing techniques, such as pursed lip breathing or other method taught during program session Exhibits proper breathing techniques, such as pursed lip breathing or other method taught during program session     Comments Reviewed PLB technique with pt.  Talked about how it works and it's importance in maintaining their exercise saturations. Thena is still on 2L of O2 during sleep. She reports that she used her oxygen more during the day while she was sick, but she has returned to only using it during sleep. We reviewd PLB technique and she was encouraged to practice PLB during exercise. She has a pulse-ox at home and she states that she has been checking her O2 saturations at home. She states that her O2 stayed around 92% while she was sick, but since she has gotten better it has been ranging between 95-96% at rest. Diaphragmatic and PLB breathing explained and performed with patient. Patient has a better understanding of how to do these exercises to help with breathing performance and relaxation. Patient performed breathing techniques adequately and to practice further at home.     Goals/Expected Outcomes Short: Become more profiecient at using PLB. Long: Become independent at using PLB. Short: Become more profiecient at using PLB. Long: Become independent at using PLB. Short: practice PLB and diaphragmatic breathing at home. Long: Use PLB and diaphragmatic breathing independently post [employer].              Oxygen Discharge (Final Oxygen Re-Evaluation):  Oxygen Re-Evaluation - 09/17/22 1204       Program Oxygen Prescription   Program Oxygen Prescription Continuous    Liters per minute 2    Comments sleep only      Home Oxygen   Home Oxygen Device Home Concentrator    Sleep Oxygen Prescription  Continuous    Liters per minute 2    Home Exercise Oxygen Prescription None    Home Resting Oxygen Prescription None     Compliance with Home Oxygen Use Yes      Goals/Expected Outcomes   Short Term Goals To learn and demonstrate proper pursed lip breathing techniques or other breathing techniques.     Long  Term Goals Exhibits proper breathing techniques, such as pursed lip breathing or other method taught during program session    Comments Diaphragmatic and PLB breathing explained and performed with patient. Patient has a better understanding of how to do these exercises to help with breathing performance and relaxation. Patient performed breathing techniques adequately and to practice further at home.    Goals/Expected Outcomes Short: practice PLB and diaphragmatic breathing at home. Long: Use PLB and diaphragmatic breathing independently post [employer].             Initial Exercise Prescription:  Initial Exercise Prescription - 07/27/22 1400       Date of Initial Exercise RX and Referring Provider   Date 07/27/22    Referring Provider [name] MD      Oxygen   Maintain Oxygen Saturation 88% or higher      Recumbant Bike   Level 2    RPM 50    Watts 30    Minutes 15    METs 3      NuStep   Level 3    SPM 80    Minutes 15    METs 3      REL-XR   Level 2    Speed 50    Minutes 15    METs 3      Track   Laps 27    Minutes 15    METs 2.47      Prescription Details   Frequency (times per week) 2    Duration Progress to 30 minutes of continuous aerobic without signs/symptoms of physical distress      Intensity   THRR 40-80% of Max Heartrate 104-138    Ratings of Perceived Exertion 11-13    Perceived Dyspnea 0-4      Progression   Progression Continue to progress workloads to maintain intensity without signs/symptoms of physical distress.      Resistance Training   Training Prescription Yes    Weight 3 lb    Reps 10-15             Perform Capillary Blood Glucose checks as needed.  Exercise Prescription Changes:   Exercise Prescription Changes     Row  Name 07/27/22 1400 08/13/22 1200 09/08/22 1400 09/22/22 0800 09/22/22 0900     Response to Exercise   Blood Pressure (Admit) 126/70 118/70 126/84 142/90 --   Blood Pressure (Exercise) 184/74 132/80 130/86 152/88 --   Blood Pressure (Exit) 128/70 122/76 122/74 126/84 --   Heart Rate (Admit) 70 bpm 73 bpm 70 bpm 80 bpm --   Heart Rate (Exercise) 110 bpm 108 bpm 95 bpm 95 bpm --   Heart Rate (Exit) 77 bpm 73 bpm 81 bpm 82 bpm --   Oxygen Saturation (Admit) 94 % 90 % 97 % 97 % --   Oxygen Saturation (Exercise) 92 % 98 % 93 % 92 % --   Oxygen Saturation (Exit) 93 % 97 % 96 % 94 % --   Rating of Perceived Exertion (Exercise) 14 15 15 15 $ --   Perceived Dyspnea (Exercise) 2 2 2 2 $ --  Symptoms hip pain 6/10 none SOB SOB --   Comments walk test resutls third full day of exercise -- -- --   Duration -- Progress to 30 minutes of  aerobic without signs/symptoms of physical distress Continue with 30 min of aerobic exercise without signs/symptoms of physical distress. Continue with 30 min of aerobic exercise without signs/symptoms of physical distress. --   Intensity -- THRR unchanged THRR unchanged THRR unchanged --     Progression   Progression -- Continue to progress workloads to maintain intensity without signs/symptoms of physical distress. Continue to progress workloads to maintain intensity without signs/symptoms of physical distress. Continue to progress workloads to maintain intensity without signs/symptoms of physical distress. --   Average METs -- 2.74 2.8 2.96 --     Resistance Training   Training Prescription -- Yes Yes Yes --   Weight -- 3 lb 3 lb 3 lb --   Reps -- 10-15 10-15 10-15 --     Interval Training   Interval Training -- No No No --     Recumbant Bike   Level -- 2 2 1.5 --   Watts -- -- 31 31 --   Minutes -- 15 15 15 $ --   METs -- 2.3 2.1 2.2 --     NuStep   Level -- 3 3 4 $ --   Minutes -- 15 15 15 $ --   METs -- 2.9 -- 3 --     REL-XR   Level -- 2 1 2 $ --   Minutes --  15 15 15 $ --   METs -- 3.1 3 3.6 --     Track   Laps -- 30 30 30 $ --   Minutes -- 15 15 15 $ --   METs -- 2.63 2.63 2.63 --     Home Exercise Plan   Plans to continue exercise at -- -- -- -- Home (comment)  walking   Frequency -- -- -- -- Add 2 additional days to program exercise sessions.   Initial Home Exercises Provided -- -- -- -- 09/22/22     Oxygen   Maintain Oxygen Saturation -- 88% or higher 88% or higher 88% or higher --    Row Name 10/06/22 1400             Response to Exercise   Blood Pressure (Admit) 128/84       Blood Pressure (Exercise) 136/64       Blood Pressure (Exit) 132/72       Heart Rate (Admit) 70 bpm       Heart Rate (Exercise) 95 bpm       Heart Rate (Exit) 83 bpm       Oxygen Saturation (Admit) 96 %       Oxygen Saturation (Exercise) 94 %       Oxygen Saturation (Exit) 96 %       Rating of Perceived Exertion (Exercise) 16       Perceived Dyspnea (Exercise) 3       Symptoms SOB       Duration Continue with 30 min of aerobic exercise without signs/symptoms of physical distress.       Intensity THRR unchanged         Progression   Progression Continue to progress workloads to maintain intensity without signs/symptoms of physical distress.       Average METs 3.05         Resistance Training   Training Prescription Yes       Weight  3 lb       Reps 10-15         Interval Training   Interval Training No         Recumbant Bike   Level 2       Watts 31       Minutes 15       METs 3.23         NuStep   Level 4       Minutes 15       METs 2.2         REL-XR   Level 2       Minutes 15       METs 4.6         Track   Laps 30       Minutes 15       METs 2.63         Home Exercise Plan   Plans to continue exercise at Home (comment)  walking       Frequency Add 2 additional days to program exercise sessions.       Initial Home Exercises Provided 09/22/22         Oxygen   Maintain Oxygen Saturation 88% or higher                 Exercise Comments:   Exercise Comments     Row Name 08/04/22 0953           Exercise Comments First full day of exercise!  Patient was oriented to gym and equipment including functions, settings, policies, and procedures.  Patient's individual exercise prescription and treatment plan were reviewed.  All starting workloads were established based on the results of the 6 minute walk test done at initial orientation visit.  The plan for exercise progression was also introduced and progression will be customized based on patient's performance and goals.                Exercise Goals and Review:   Exercise Goals     Row Name 07/27/22 1431             Exercise Goals   Increase Physical Activity Yes       Intervention Provide advice, education, support and counseling about physical activity/exercise needs.;Develop an individualized exercise prescription for aerobic and resistive training based on initial evaluation findings, risk stratification, comorbidities and participant's personal goals.       Expected Outcomes Long Term: Exercising regularly at least 3-5 days a week.;Short Term: Attend rehab on a regular basis to increase amount of physical activity.;Long Term: Add in home exercise to make exercise part of routine and to increase amount of physical activity.       Increase Strength and Stamina Yes       Intervention Provide advice, education, support and counseling about physical activity/exercise needs.;Develop an individualized exercise prescription for aerobic and resistive training based on initial evaluation findings, risk stratification, comorbidities and participant's personal goals.       Expected Outcomes Short Term: Increase workloads from initial exercise prescription for resistance, speed, and METs.;Short Term: Perform resistance training exercises routinely during rehab and add in resistance training at home;Long Term: Improve cardiorespiratory fitness, muscular  endurance and strength as measured by increased METs and functional capacity (6MWT)       Able to understand and use rate of perceived exertion (RPE) scale Yes       Intervention Provide education and explanation on how to  use RPE scale       Expected Outcomes Short Term: Able to use RPE daily in rehab to express subjective intensity level;Long Term:  Able to use RPE to guide intensity level when exercising independently       Able to understand and use Dyspnea scale Yes       Intervention Provide education and explanation on how to use Dyspnea scale       Expected Outcomes Short Term: Able to use Dyspnea scale daily in rehab to express subjective sense of shortness of breath during exertion;Long Term: Able to use Dyspnea scale to guide intensity level when exercising independently       Knowledge and understanding of Target Heart Rate Range (THRR) Yes       Intervention Provide education and explanation of THRR including how the numbers were predicted and where they are located for reference       Expected Outcomes Short Term: Able to state/look up THRR;Long Term: Able to use THRR to govern intensity when exercising independently;Short Term: Able to use daily as guideline for intensity in rehab       Able to check pulse independently Yes       Intervention Provide education and demonstration on how to check pulse in carotid and radial arteries.;Review the importance of being able to check your own pulse for safety during independent exercise       Expected Outcomes Short Term: Able to explain why pulse checking is important during independent exercise;Long Term: Able to check pulse independently and accurately       Understanding of Exercise Prescription Yes       Intervention Provide education, explanation, and written materials on patient's individual exercise prescription       Expected Outcomes Short Term: Able to explain program exercise prescription;Long Term: Able to explain home exercise  prescription to exercise independently                Exercise Goals Re-Evaluation :  Exercise Goals Re-Evaluation     Row Name 08/04/22 0956 08/13/22 1245 08/25/22 0824 09/01/22 0925 09/08/22 1417     Exercise Goal Re-Evaluation   Exercise Goals Review Able to understand and use rate of perceived exertion (RPE) scale;Able to understand and use Dyspnea scale;Knowledge and understanding of Target Heart Rate Range (THRR);Understanding of Exercise Prescription Understanding of Exercise Prescription;Increase Strength and Stamina;Increase Physical Activity Understanding of Exercise Prescription;Increase Strength and Stamina;Increase Physical Activity Understanding of Exercise Prescription;Increase Strength and Stamina;Increase Physical Activity Understanding of Exercise Prescription;Increase Strength and Stamina;Increase Physical Activity   Comments Reviewed RPE  and dyspnea scale, THR and program prescription with pt today.  Pt voiced understanding and was given a copy of goals to take home. Chanel is off to a good start in rehab.  She has completed her first three full days of exercise thus far.  She is already up to 30 laps!!  We will continue to monitor her progress. Breianna has not attended rehab since the last review. We will contact pt and remind her of the importance of good attendance in the program. We will continue to monitor when she returns. Phiona feels that she is making good progress in the program. She states that getting sick did set her back a little bit, but she is excited about getting back to her exercise routine. She states that the main benefits she has seen have been improved strength, she has lost some weight, and she feels better overall. She also states that she  has been walking at home on her days away from rehab for 45 minutes. She reports walking at home about three days a week. Laporscha is doing well in rehab as she has returned from being out a bit. As she is getting back into the  routine, she was able to walk a full 30 laps around the track and increased her watts on the recumbent bike to 31 watts. Her XR stayed at a level 1 and we will encourage her to inrease it back up. Will continue to monitor.   Expected Outcomes Short: Use RPE daily to regulate intensity. Long: Follow program prescription in THR. Short: Continue to attend rehab regularly Long: Continue to follow program prescription Short: Return to regular attendance in the program. Long: Continue to follow program prescription. Short: Continue to walk on days away from rehab. Long: Continue to improve strength and stamina. Short: Increase level to 2 on the XR Long: Continue to increase overall stamin and MET level    Row Name 09/22/22 0850 09/22/22 0933 10/06/22 1435         Exercise Goal Re-Evaluation   Exercise Goals Review Understanding of Exercise Prescription;Increase Strength and Stamina;Increase Physical Activity Understanding of Exercise Prescription;Increase Strength and Stamina;Increase Physical Activity;Knowledge and understanding of Target Heart Rate Range (THRR);Able to understand and use rate of perceived exertion (RPE) scale;Able to understand and use Dyspnea scale;Able to check pulse independently Increase Physical Activity;Increase Strength and Stamina;Understanding of Exercise Prescription     Comments Wilbert is doing well in rehab. She recently increased her overall average MET level to 2.96 METs. She also improved up to level 4 on the T4 Nustep and back up to level 2 on the XR. She has conistently walked 30 laps on the track as well, and could push for more. We will continue to monitor her progress in the program. Reviewed home exercise with pt today.  Pt plans to walk at home for exercise.  Reviewed THR, pulse, RPE, sign and symptoms, pulse oximetery and when to call 911 or MD.  Also discussed weather considerations and indoor options.  Pt voiced understanding. Samaiya continues to do well in rehab. She has  stayed pretty consistent with her workloads on the seated machines such as the T4 Nustep, XR, and Biostep. She also hits consistently walking 30 laps on track and not quite hitting her THR. Staff will encourage patient to start increasing her laps and workloads. Will continue to monitor.     Expected Outcomes Short: Continue to push for more laps on the track. Long: Continue to improve strength and stamina. Short: Start to add in more walking at home Long: Continue to improve stamina Short: Increase laps on track beyond 30, increase to level 3 on the XR Long: Continue to increase overall MET level and stamina              Discharge Exercise Prescription (Final Exercise Prescription Changes):  Exercise Prescription Changes - 10/06/22 1400       Response to Exercise   Blood Pressure (Admit) 128/84    Blood Pressure (Exercise) 136/64    Blood Pressure (Exit) 132/72    Heart Rate (Admit) 70 bpm    Heart Rate (Exercise) 95 bpm    Heart Rate (Exit) 83 bpm    Oxygen Saturation (Admit) 96 %    Oxygen Saturation (Exercise) 94 %    Oxygen Saturation (Exit) 96 %    Rating of Perceived Exertion (Exercise) 16    Perceived Dyspnea (Exercise) 3  Symptoms SOB    Duration Continue with 30 min of aerobic exercise without signs/symptoms of physical distress.    Intensity THRR unchanged      Progression   Progression Continue to progress workloads to maintain intensity without signs/symptoms of physical distress.    Average METs 3.05      Resistance Training   Training Prescription Yes    Weight 3 lb    Reps 10-15      Interval Training   Interval Training No      Recumbant Bike   Level 2    Watts 31    Minutes 15    METs 3.23      NuStep   Level 4    Minutes 15    METs 2.2      REL-XR   Level 2    Minutes 15    METs 4.6      Track   Laps 30    Minutes 15    METs 2.63      Home Exercise Plan   Plans to continue exercise at Home (comment)   walking   Frequency Add 2  additional days to program exercise sessions.    Initial Home Exercises Provided 09/22/22      Oxygen   Maintain Oxygen Saturation 88% or higher             Nutrition:  Target Goals: Understanding of nutrition guidelines, daily intake of sodium <1537m, cholesterol <2044m calories 30% from fat and 7% or less from saturated fats, daily to have 5 or more servings of fruits and vegetables.  Education: All About Nutrition: -Group instruction provided by verbal, written material, interactive activities, discussions, models, and posters to present general guidelines for heart healthy nutrition including fat, fiber, MyPlate, the role of sodium in heart healthy nutrition, utilization of the nutrition label, and utilization of this knowledge for meal planning. Follow up email sent as well. Written material given at graduation.   Biometrics:  Pre Biometrics - 07/27/22 1431       Pre Biometrics   Height 5' 7"$  (1.702 m)    Weight 175 lb 14.4 oz (79.8 kg)    Waist Circumference 40.5 inches    Hip Circumference 41 inches    Waist to Hip Ratio 0.99 %    BMI (Calculated) 27.54    Single Leg Stand 6.7 seconds              Nutrition Therapy Plan and Nutrition Goals:  Nutrition Therapy & Goals - 07/27/22 0938       Nutrition Therapy   Diet Heart healthy, low Na, pulmonary MNT    Drug/Food Interactions Statins/Certain Fruits    Protein (specify units) 85-95g    Fiber 25 grams    Whole Grain Foods 3 servings    Saturated Fats 12 max. grams    Fruits and Vegetables 8 servings/day    Sodium 2 grams      Personal Nutrition Goals   Nutrition Goal ST: review paperwork, try eating small melas/snacks, try ensure complete LT: meet protein and energy needs, eat a variety of whole foods    Comments [age].o. F admitted to pulmonary rehab for COPD. PMHx includes HTN, HLD, GERD, former tobacco use, former alcohol abuse, macrocytic anemia. Relevant medications includes lipitor, buspirone, zetia,  mobic, remeron, MVI with minerals, omeprazole, oxycodone, K+, breztri. Kjirsten reports meeting with outpatient RD twice and feels that since her diet is limited as she reports being a "pickey eater"  in addition to a reduced appetite. Reviewed what outpatient RD suggested as well as general heart healthy eating and pulmonary MNT. She does not eat until the afternoon/evening at a 3-5pm meal when she gets hungry: pork chop and some macaroni and cheese. She does not like most vegetables, but she enjoys salads (onions and cucumbers and cheese and some egg, pineapple, romaine and iceburg lettuce) and potatoes. She reports not eating a large meal at this time. Kenyetta reports that she has not tried the reommendations given by outpatient RD as she does not like eating when she is not hunrgy, but is open to trying the premier protein - suggested since she is not eating other meals/snacks at this time she could have ensure complete which would provide the same amount of protein for 350 calories which is closer to a meal replacement. Encouraged that if she begins to eat more meals/snacks that premier protein would be a good option. Encouraged mechanical eating while she is still not feeling hunger cues and suggested snacks with fiber, protein, and fat such as fruit with yogurt or peanut butter. Suggested that her appetite could be lower due to COPD, anemia, and/or prolonged time eating a smaller amount. Ahnyla also reports her iron is still low and does not know what her MD suggested - encouraged her to reach out to her MD to see what they recommend.      Intervention Plan   Intervention Prescribe, educate and counsel regarding individualized specific dietary modifications aiming towards targeted core components such as weight, hypertension, lipid management, diabetes, heart failure and other comorbidities.;Nutrition handout(s) given to patient.    Expected Outcomes Short Term Goal: Understand basic principles of dietary  content, such as calories, fat, sodium, cholesterol and nutrients.;Short Term Goal: A plan has been developed with personal nutrition goals set during dietitian appointment.;Long Term Goal: Adherence to prescribed nutrition plan.             Nutrition Assessments:  MEDIFICTS Score Key: ?70 Need to make dietary changes  40-70 Heart Healthy Diet ? 40 Therapeutic Level Cholesterol Diet  Flowsheet Row Pulmonary Rehab from 07/27/2022 in [facility] Cardiac and Pulmonary Rehab  Picture Your Plate Total Score on Admission 40      Picture Your Plate Scores: [id number] Unhealthy dietary pattern with much room for improvement. 41-50 Dietary pattern unlikely to meet recommendations for good health and room for improvement. 51-60 More healthful dietary pattern, with some room for improvement.  >60 Healthy dietary pattern, although there may be some specific behaviors that could be improved.   Nutrition Goals Re-Evaluation:  Nutrition Goals Re-Evaluation     [place] Name 09/01/22 0936             Goals   Current Weight 172 lb 14.4 oz (78.4 kg)       Nutrition Goal Ashna reports that she has continued to work on her portion control by trying to eat smaller meals and snacks. She has also been trying to eat a varietyof whole foods. We will continue to monitor her dietary habits.       Comment Short: Continue to try eating smaller meals/snacks. Long: Continue to practice nutritional habits discussed with RD.                Nutrition Goals Discharge (Final Nutrition Goals Re-Evaluation):  Nutrition Goals Re-Evaluation - 09/01/22 0936       Goals   Current Weight 172 lb 14.4 oz (78.4 kg)    Nutrition Goal Joyann reports  that she has continued to work on her portion control by trying to eat smaller meals and snacks. She has also been trying to eat a varietyof whole foods. We will continue to monitor her dietary habits.    Comment Short: Continue to try eating smaller meals/snacks. Long: Continue to  practice nutritional habits discussed with RD.             Psychosocial: Target Goals: Acknowledge presence or absence of significant depression and/or stress, maximize coping skills, provide positive support system. Participant is able to verbalize types and ability to use techniques and skills needed for reducing stress and depression.   Education: Stress, Anxiety, and Depression - Group verbal and visual presentation to define topics covered.  Reviews how body is impacted by stress, anxiety, and depression.  Also discusses healthy ways to reduce stress and to treat/manage anxiety and depression.  Written material given at graduation.   Education: Sleep Hygiene -Provides group verbal and written instruction about how sleep can affect your health.  Define sleep hygiene, discuss sleep cycles and impact of sleep habits. Review good sleep hygiene tips.    Initial Review & Psychosocial Screening:  Initial Psych Review & Screening - 07/16/22 0912       Initial Review   Current issues with Current Depression;History of Depression;Current Psychotropic Meds;Current Sleep Concerns;Current Stress Concerns    Source of Stress Concerns Chronic Illness    Comments She has always had some form of depression and takes Buspar for it. Ravon feels  depressed at times but states it does not necessarily stem from something.      Family Dynamics   Good Support System? Yes    Comments She can look to her two sisters for support that live in town.      Barriers   Psychosocial barriers to participate in program The patient should benefit from training in stress management and relaxation.      Screening Interventions   Interventions To provide support and resources with identified psychosocial needs;Encouraged to exercise;Provide feedback about the scores to participant    Expected Outcomes Short Term goal: Utilizing psychosocial counselor, staff and physician to assist with identification of specific  Stressors or current issues interfering with healing process. Setting desired goal for each stressor or current issue identified.;Long Term Goal: Stressors or current issues are controlled or eliminated.;Short Term goal: Identification and review with participant of any Quality of Life or Depression concerns found by scoring the questionnaire.;Long Term goal: The participant improves quality of Life and PHQ9 Scores as seen by post scores and/or verbalization of changes             Quality of Life Scores:  Scores of 19 and below usually indicate a poorer quality of life in these areas.  A difference of  2-3 points is a clinically meaningful difference.  A difference of 2-3 points in the total score of the Quality of Life Index has been associated with significant improvement in overall quality of life, self-image, physical symptoms, and general health in studies assessing change in quality of life.  PHQ-9: Review Flowsheet  More data exists      08/13/2022 07/27/2022 05/13/2022 04/22/2022 04/21/2022  Depression screen PHQ 2/9  Decreased Interest  0  0 0  Down, Depressed, Hopeless  0  0 0  PHQ - 2 Score  0  0 0  Altered sleeping - 0  - 1  Tired, decreased energy - 0  - 1  Change in appetite -  0  - 0  Feeling bad or failure about yourself  - 0  - 0  Trouble concentrating - 0  - 0  Moving slowly or fidgety/restless - 0  - 0  Suicidal thoughts - 0  - 0  PHQ-9 Score - 0  - 2  Difficult doing work/chores - Not difficult at all  - Not difficult at all    Details       Information is confidential and restricted. Go to Review Flowsheets to unlock data.        Interpretation of Total Score  Total Score Depression Severity:  1-4 = Minimal depression, 5-9 = Mild depression, 10-14 = Moderate depression, 15-19 = Moderately severe depression, 20-27 = Severe depression   Psychosocial Evaluation and Intervention:  Psychosocial Evaluation - 07/16/22 0917       Psychosocial Evaluation &  Interventions   Interventions Stress management education;Relaxation education;Encouraged to exercise with the program and follow exercise prescription    Comments She has always had some form of depression and takes Buspar for it. Leinani feels depressed at times but states it does not necessarily stem from something.She can look to her two sisters for support that live in town.    Expected Outcomes Short: Start [facility] to help with mood. Long: Maintain a healthy mental state.    Continue Psychosocial Services  Follow up required by staff             Psychosocial Re-Evaluation:  Psychosocial Re-Evaluation     [place] Name 09/01/22 0931 09/22/22 0956           Psychosocial Re-Evaluation   Current issues with Current Depression;Current Sleep Concerns None Identified      Comments Norma is still taking Buspar for anxiety and depression. She states that her depressive symptoms have improved since starting the program. She states that for stress relief she likes to play game on her phone, cook, and exercise. She states that she does have a good support system at home made up by her husband and two sisters. She reports that she was having some sleep concerns, but her sleep has improved since starting the program. Patient reports no issues with their current mental states, sleep, stress, depression or anxiety. Will follow up with patient in a few weeks for any changes.      Expected Outcomes Short: Continue to attend rehab for stress relief. Long: Maintain positive outlook. Short: Continue to exercise regularly to support mental health and notify staff of any changes. Long: maintain mental health and well being through teaching of rehab or prescribed medications independently.      Interventions Encouraged to attend Pulmonary Rehabilitation for the exercise Encouraged to attend Pulmonary Rehabilitation for the exercise      Continue Psychosocial Services  Follow up required by staff Follow up required  by staff               Psychosocial Discharge (Final Psychosocial Re-Evaluation):  Psychosocial Re-Evaluation - 09/22/22 0956       Psychosocial Re-Evaluation   Current issues with None Identified    Comments Patient reports no issues with their current mental states, sleep, stress, depression or anxiety. Will follow up with patient in a few weeks for any changes.    Expected Outcomes Short: Continue to exercise regularly to support mental health and notify staff of any changes. Long: maintain mental health and well being through teaching of rehab or prescribed medications independently.    Interventions Encouraged to attend Pulmonary  Rehabilitation for the exercise    Continue Psychosocial Services  Follow up required by staff             Education: Education Goals: Education classes will be provided on a weekly basis, covering required topics. Participant will state understanding/return demonstration of topics presented.  Learning Barriers/Preferences:  Learning Barriers/Preferences - 07/16/22 0911       Learning Barriers/Preferences   Learning Barriers None    Learning Preferences None             General Pulmonary Education Topics:  Infection Prevention: - Provides verbal and written material to individual with discussion of infection control including proper hand washing and proper equipment cleaning during exercise session. Flowsheet Row Pulmonary Rehab from 10/01/2022 in [facility] Cardiac and Pulmonary Rehab  Date 07/16/22  Educator [facility]  Instruction Review Code 1- Verbalizes Understanding       Falls Prevention: - Provides verbal and written material to individual with discussion of falls prevention and safety. Flowsheet Row Pulmonary Rehab from 10/01/2022 in [facility] Cardiac and Pulmonary Rehab  Date 07/16/22  Educator [facility]  Instruction Review Code 1- Verbalizes Understanding       Chronic Lung Disease Review: - Group verbal instruction with posters, models,  PowerPoint presentations and videos,  to review new updates, new respiratory medications, new advancements in procedures and treatments. Providing information on websites and "800" numbers for continued self-education. Includes information about supplement oxygen, available portable oxygen systems, continuous and intermittent flow rates, oxygen safety, concentrators, and Medicare reimbursement for oxygen. Explanation of Pulmonary Drugs, including class, frequency, complications, importance of spacers, rinsing mouth after steroid MDI's, and proper cleaning methods for nebulizers. Review of basic lung anatomy and physiology related to function, structure, and complications of lung disease. Review of risk factors. Discussion about methods for diagnosing sleep apnea and types of masks and machines for OSA. Includes a review of the use of types of environmental controls: home humidity, furnaces, filters, dust mite/pet prevention, HEPA vacuums. Discussion about weather changes, air quality and the benefits of nasal washing. Instruction on Warning signs, infection symptoms, calling MD promptly, preventive modes, and value of vaccinations. Review of effective airway clearance, coughing and/or vibration techniques. Emphasizing that all should Create an Action Plan. Written material given at graduation. Flowsheet Row Pulmonary Rehab from 10/01/2022 in [facility] Cardiac and Pulmonary Rehab  Education need identified 07/27/22       AED/CPR: - Group verbal and written instruction with the use of models to demonstrate the basic use of the AED with the basic ABC's of resuscitation.    Anatomy and Cardiac Procedures: - Group verbal and visual presentation and models provide information about basic cardiac anatomy and function. Reviews the testing methods done to diagnose heart disease and the outcomes of the test results. Describes the treatment choices: Medical Management, Angioplasty, or Coronary Bypass Surgery for  treating various heart conditions including Myocardial Infarction, Angina, Valve Disease, and Cardiac Arrhythmias.  Written material given at graduation. Flowsheet Row Pulmonary Rehab from 10/01/2022 in [facility] Cardiac and Pulmonary Rehab  Date 09/24/22  Educator SB  Instruction Review Code 1- Verbalizes Understanding       Medication Safety: - Group verbal and visual instruction to review commonly prescribed medications for heart and lung disease. Reviews the medication, class of the drug, and side effects. Includes the steps to properly store meds and maintain the prescription regimen.  Written material given at graduation.   Other: -Provides group and verbal instruction on various topics (see comments) Flowsheet  Row Pulmonary Rehab from 10/01/2022 in [facility] Cardiac and Pulmonary Rehab  Date 10/01/22  Educator SB  Instruction Review Code 1- Verbalizes Understanding       Knowledge Questionnaire Score:  Knowledge Questionnaire Score - 07/27/22 1432       Knowledge Questionnaire Score   Pre Score 14/18              Core Components/Risk Factors/Patient Goals at Admission:  Personal Goals and Risk Factors at Admission - 07/27/22 1433       Core Components/Risk Factors/Patient Goals on Admission    Weight Management Yes;Weight Loss    Intervention Weight Management: Develop a combined nutrition and exercise program designed to reach desired caloric intake, while maintaining appropriate intake of nutrient and fiber, sodium and fats, and appropriate energy expenditure required for the weight goal.;Weight Management: Provide education and appropriate resources to help participant work on and attain dietary goals.;Weight Management/Obesity: Establish reasonable short term and long term weight goals.    Admit Weight 175 lb 14.4 oz (79.8 kg)    Goal Weight: Short Term 170 lb (77.1 kg)    Goal Weight: Long Term 165 lb (74.8 kg)    Expected Outcomes Long Term: Adherence to nutrition and  physical activity/exercise program aimed toward attainment of established weight goal;Short Term: Continue to assess and modify interventions until short term weight is achieved;Weight Loss: Understanding of general recommendations for a balanced deficit meal plan, which promotes 1-2 lb weight loss per week and includes a negative energy balance of [phone] kcal/d;Understanding of distribution of calorie intake throughout the day with the consumption of 4-5 meals/snacks;Understanding recommendations for meals to include 15-35% energy as protein, 25-35% energy from fat, 35-60% energy from carbohydrates, less than 244m of dietary cholesterol, 20-35 gm of total fiber daily    Improve shortness of breath with ADL's Yes    Intervention Provide education, individualized exercise plan and daily activity instruction to help decrease symptoms of SOB with activities of daily living.    Expected Outcomes Short Term: Improve cardiorespiratory fitness to achieve a reduction of symptoms when performing ADLs;Long Term: Be able to perform more ADLs without symptoms or delay the onset of symptoms    Increase knowledge of respiratory medications and ability to use respiratory devices properly  Yes    Intervention Provide education and demonstration as needed of appropriate use of medications, inhalers, and oxygen therapy.    Expected Outcomes Short Term: Achieves understanding of medications use. Understands that oxygen is a medication prescribed by physician. Demonstrates appropriate use of inhaler and oxygen therapy.;Long Term: Maintain appropriate use of medications, inhalers, and oxygen therapy.    Hypertension Yes    Intervention Provide education on lifestyle modifcations including regular physical activity/exercise, weight management, moderate sodium restriction and increased consumption of fresh fruit, vegetables, and low fat dairy, alcohol moderation, and smoking cessation.;Monitor prescription use compliance.     Expected Outcomes Short Term: Continued assessment and intervention until BP is < 140/950mHG in hypertensive participants. < 130/8074mG in hypertensive participants with diabetes, heart failure or chronic kidney disease.;Long Term: Maintenance of blood pressure at goal levels.    Lipids Yes    Intervention Provide education and support for participant on nutrition & aerobic/resistive exercise along with prescribed medications to achieve LDL <63m9mDL >40mg39m Expected Outcomes Short Term: Participant states understanding of desired cholesterol values and is compliant with medications prescribed. Participant is following exercise prescription and nutrition guidelines.;Long Term: Cholesterol controlled with medications as prescribed, with  individualized exercise RX and with personalized nutrition plan. Value goals: LDL < 57m, HDL > 40 mg.             Education:Diabetes - Individual verbal and written instruction to review signs/symptoms of diabetes, desired ranges of glucose level fasting, after meals and with exercise. Acknowledge that pre and post exercise glucose checks will be done for 3 sessions at entry of program.   Know Your Numbers and Heart Failure: - Group verbal and visual instruction to discuss disease risk factors for cardiac and pulmonary disease and treatment options.  Reviews associated critical values for Overweight/Obesity, Hypertension, Cholesterol, and Diabetes.  Discusses basics of heart failure: signs/symptoms and treatments.  Introduces Heart Failure Zone chart for action plan for heart failure.  Written material given at graduation. [facility]  Date 08/06/22  Educator SB  Instruction Review Code 1- Verbalizes Understanding       Core Components/Risk Factors/Patient Goals Review:   Goals and Risk Factor Review     Row Name 09/01/22 [id number]/06/24 0955           Core Components/Risk  Factors/Patient Goals Review   Personal Goals Review Weight Management/Obesity;Hypertension Improve shortness of breath with ADL's      Review Amarria reports that she has lost some weight as she is down from 175 lb to 172.9 lb. She is still working toward her long term weight goal of 165 lb. She has been checking her BP at home and reports that they have been around 130/78. She was encouraged to continue to check her BP at home. Spoke to patient about their shortness of breath and what they can do to improve. Patient has been informed of breathing techniques when starting the program. Patient is informed to tell staff if they have had any med changes and that certain meds they are taking or not taking can be causing shortness of breath.      Expected Outcomes Short: Continue to check BP at home. Long: Continue to work toward long term weight goal. Short: Attend [facility] regularly to improve shortness of breath with ADL's. Long: maintain independence with ADL's               Core Components/Risk Factors/Patient Goals at Discharge (Final Review):   Goals and Risk Factor Review - 09/22/22 0955       Core Components/Risk Factors/Patient Goals Review   Personal Goals Review Improve shortness of breath with ADL's    Review Spoke to patient about their shortness of breath and what they can do to improve. Patient has been informed of breathing techniques when starting the program. Patient is informed to tell staff if they have had any med changes and that certain meds they are taking or not taking can be causing shortness of breath.    Expected Outcomes Short: Attend [facility] regularly to improve shortness of breath with ADL's. Long: maintain independence with ADL's             ITP Comments:  ITP Comments     Row Name 07/16/22 0909 07/27/22 1425 08/04/22 0953 08/12/22 0956 09/09/22 0904   ITP Comments Virtual Visit completed. Patient informed on EP and RD appointment and 6 Minute walk test.  Patient also informed of patient health questionnaires on My Chart. Patient Verbalizes understanding. [facility] Completed 6MWT and gym orientation. Initial ITP created and sent for review to Dr. FZetta Bills Medical  Director. First full day of exercise!  Patient was oriented to gym and equipment including functions, settings, policies, and procedures.  Patient's individual exercise prescription and treatment plan were reviewed.  All starting workloads were established based on the results of the 6 minute walk test done at initial orientation visit.  The plan for exercise progression was also introduced and progression will be customized based on patient's performance and goals. 30 Day review completed. Medical Director ITP review done, changes made as directed, and signed approval by Medical Director.    new to program 30 Day review completed. Medical Director ITP review done, changes made as directed, and signed approval by Medical Director.    Rattan Name 10/07/22 1212           ITP Comments 30 day review completed. ITP sent to Dr. Zetta Bills, Medical Director of  Pulmonary Rehab. Continue with ITP unless changes are made by physician.                Comments: 30 day review

## 2022-10-08 ENCOUNTER — Encounter: Payer: Medicare Other | Admitting: *Deleted

## 2022-10-08 DIAGNOSIS — J449 Chronic obstructive pulmonary disease, unspecified: Secondary | ICD-10-CM

## 2022-10-08 NOTE — Progress Notes (Signed)
Daily Session Note  Patient Details  Name: Gabrielle Pineda MRN: [id number] Date of Birth: 11-05-1955 Referring Provider:   Flowsheet Row Pulmonary Rehab from 07/27/2022 in [facility] Cardiac and Pulmonary Rehab  Referring Provider [name] MD       Encounter Date: 10/08/2022  Check In:  Session Check In - 10/08/22 0931       Check-In   Supervising physician immediately available to respond to emergencies See telemetry face sheet for immediately available ER MD    Location [facility]-Cardiac & Pulmonary Rehab    Staff Present Darlyne Russian, RN, ADN;Jessica Luan Pulling, MA, RCEP, CCRP, Bertram Gala, MS, ACSM CEP, Exercise Physiologist    Virtual Visit No    Medication changes reported     No    Fall or balance concerns reported    No    Warm-up and Cool-down Performed on first and last piece of equipment    Resistance Training Performed Yes    VAD Patient? No    PAD/SET Patient? No      Pain Assessment   Currently in Pain? No/denies                Social History   Tobacco Use  Smoking Status Former   Packs/day: 2.00   Years: 40.00   Total pack years: 80.00   Types: Cigarettes   Quit date: 10/30/2020   Years since quitting: 1.9  Smokeless Tobacco Never    Goals Met:  Independence with exercise equipment Exercise tolerated well No report of concerns or symptoms today Strength training completed today  Goals Unmet:  Not Applicable  Comments: Pt able to follow exercise prescription today without complaint.  Will continue to monitor for progression.    Dr. Emily Filbert is Medical Director for [place].  Dr. Ottie Glazier is Medical Director for [facility] Pulmonary Rehabilitation.

## 2022-10-09 LAB — TOXASSURE SELECT 13 (MW), URINE

## 2022-10-12 ENCOUNTER — Other Ambulatory Visit: Payer: Self-pay | Admitting: Psychiatry

## 2022-10-12 ENCOUNTER — Other Ambulatory Visit: Payer: Self-pay | Admitting: Physician Assistant

## 2022-10-12 DIAGNOSIS — F3342 Major depressive disorder, recurrent, in full remission: Secondary | ICD-10-CM

## 2022-10-12 DIAGNOSIS — F5101 Primary insomnia: Secondary | ICD-10-CM

## 2022-10-12 DIAGNOSIS — K219 Gastro-esophageal reflux disease without esophagitis: Secondary | ICD-10-CM

## 2022-10-12 DIAGNOSIS — F411 Generalized anxiety disorder: Secondary | ICD-10-CM

## 2022-10-13 ENCOUNTER — Encounter: Payer: Medicare Other | Admitting: *Deleted

## 2022-10-13 DIAGNOSIS — J449 Chronic obstructive pulmonary disease, unspecified: Secondary | ICD-10-CM | POA: Diagnosis not present

## 2022-10-13 NOTE — Progress Notes (Signed)
Daily Session Note  Patient Details  Name: Gabrielle Pineda MRN: [id number] Date of Birth: 03-Sep-1955 Referring Provider:   Flowsheet Row Pulmonary Rehab from 07/27/2022 in [facility] Cardiac and Pulmonary Rehab  Referring Provider [name] MD       Encounter Date: 10/13/2022  Check In:  Session Check In - 10/13/22 0925       Check-In   Supervising physician immediately available to respond to emergencies See telemetry face sheet for immediately available ER MD    Location [facility]-Cardiac & Pulmonary Rehab    Staff Present Renita Papa, RN BSN;Jessica Luan Pulling, MA, RCEP, CCRP, Bertram Gala, MS, ACSM CEP, Exercise Physiologist    Virtual Visit No    Medication changes reported     No    Fall or balance concerns reported    No    Warm-up and Cool-down Performed on first and last piece of equipment    Resistance Training Performed Yes    VAD Patient? No    PAD/SET Patient? No      Pain Assessment   Currently in Pain? No/denies                Social History   Tobacco Use  Smoking Status Former   Packs/day: 2.00   Years: 40.00   Total pack years: 80.00   Types: Cigarettes   Quit date: 10/30/2020   Years since quitting: 1.9  Smokeless Tobacco Never    Goals Met:  Independence with exercise equipment Exercise tolerated well No report of concerns or symptoms today Strength training completed today  Goals Unmet:  Not Applicable  Comments: Pt able to follow exercise prescription today without complaint.  Will continue to monitor for progression.    Dr. Emily Filbert is Medical Director for [place].  Dr. Ottie Glazier is Medical Director for [facility] Pulmonary Rehabilitation.

## 2022-10-14 ENCOUNTER — Telehealth: Payer: Self-pay

## 2022-10-14 NOTE — Progress Notes (Signed)
Care Management & Coordination Services Pharmacy Team Pharmacy Assistant   Name: Gabrielle Pineda  MRN: [id number] DOB: [birth year]  Reason for Encounter: Medication Coordination and Delivery for [facility]  Contacted patient to discuss medications and coordinate delivery from [place]. Spoke with patient on 10/14/2022   Chart review: Recent office visits:  None ID  Recent consult visits:  10/13/2022 Renita Papa, RN (Pulmonary Rehab) for COPD- No medication changes noted, No orders placed  10/08/2022 Darlyne Russian, RN (Pulmonary Rehab) for COPD- No medication changes noted, No orders placed  10/06/2022 Alberteen Sam, RN (Pulmonary Rehab) for COPD- No medication changes noted, No orders placed  10/06/2022 [name], MD (Pain Management) for Back Pain- No medication changes noted, Lab order placed, MR Lumbar Spine w/o contrast order placed, BP: 146/83, Patient to follow-up in 4 months  10/01/2022 Darlyne Russian, RN (Pulmonary Rehab) for COPD- No medication changes noted, No orders placed  09/29/2022  Heath Lark, RN (Pulmonary Rehab) for COPD- No medication changes noted, No orders placed  09/24/2022  Darlyne Russian, RN (Pulmonary Rehab) for COPD- No medication changes noted, No orders placed  09/22/2022  Heath Lark, RN (Pulmonary Rehab) for COPD- No medication changes noted, No orders placed  09/17/2022 Darlyne Russian, RN (Pulmonary Rehab) for COPD- No medication changes noted, No orders placed  09/15/2022 Heath Lark, RN (Pulmonary Rehab) for COPD- No medication changes noted, No orders placed  Hospital visits:  None in previous 6 months  Medications: Outpatient Encounter Medications as of 10/14/2022  Medication Sig   acyclovir ointment (ZOVIRAX) 5 % :Topical Every 3 Hours as needed for lesion   albuterol (PROVENTIL) (2.5 MG/3ML) 0.083% nebulizer solution Take 3 mLs (2.5 mg total) by nebulization every 4 (four) hours as needed for wheezing or shortness of  breath.   albuterol (VENTOLIN HFA) 108 (90 Base) MCG/ACT inhaler INHALE TWO PUFFS BY MOUTH INTO LUNGS every SIX hours AS NEEDED FOR WHEEZING AND/OR SHORTNESS OF BREATH   ARIPiprazole (ABILIFY) 5 MG tablet Take 1 tablet (5 mg total) by mouth in the morning.   atorvastatin (LIPITOR) 40 MG tablet Take 1 tablet (40 mg total) by mouth every morning.   Budeson-Glycopyrrol-Formoterol (BREZTRI AEROSPHERE) 160-9-4.8 MCG/ACT AERO Inhale 2 puffs into the lungs in the morning and at bedtime.   carvedilol (COREG) 12.5 MG tablet TAKE ONE TABLET BY MOUTH EVERY MORNING and TAKE ONE TABLET BY MOUTH EVERYDAY AT BEDTIME   ezetimibe (ZETIA) 10 MG tablet Take 1 tablet (10 mg total) by mouth every morning.   gabapentin (NEURONTIN) 400 MG capsule Take 1 capsule (400 mg total) by mouth 2 (two) times daily.   leflunomide (ARAVA) 20 MG tablet Take 20 mg by mouth at bedtime.   LINZESS 145 MCG CAPS capsule Take 145 mcg by mouth daily before breakfast.   meloxicam (MOBIC) 15 MG tablet Take 15 mg by mouth at bedtime.   mirtazapine (REMERON) 15 MG tablet TAKE ONE TABLET BY MOUTH EVERYDAY AT BEDTIME   montelukast (SINGULAIR) 10 MG tablet Take 1 tablet (10 mg total) by mouth at bedtime.   Multiple Vitamin (MULTIVITAMIN WITH MINERALS) TABS tablet Take 1 tablet by mouth daily.   Nebulizer MISC Compressor nebulizer to use with nebulized medications as instructed   omeprazole (PRILOSEC) 40 MG capsule TAKE ONE CAPSULE BY MOUTH EVERY MORNING and TAKE ONE CAPSULE BY MOUTH EVERYDAY AT BEDTIME   [START ON 10/30/2022] oxyCODONE-acetaminophen (PERCOCET) 5-325 MG tablet Take 1 tablet by mouth every 8 (eight) hours as needed for severe pain. Must last  30 days.   [START ON 11/29/2022] oxyCODONE-acetaminophen (PERCOCET) 5-325 MG tablet Take 1 tablet by mouth every 8 (eight) hours as needed for severe pain. Must last 30 days.   [START ON 12/29/2022] oxyCODONE-acetaminophen (PERCOCET) 5-325 MG tablet Take 1 tablet by mouth every 8 (eight) hours as  needed for severe pain. Must last 30 days.   potassium chloride SA (KLOR-CON M) 20 MEQ tablet Take 1 tablet (20 mEq total) by mouth daily with breakfast.   SPIKEVAX syringe    topiramate (TOPAMAX) 25 MG tablet Take 1 tablet (25 mg total) by mouth 2 (two) times daily.   valACYclovir (VALTREX) 1000 MG tablet TAKE TWO TABLETS BY MOUTH TWICE DAILY AS NEEDED FOR 5 DAYS FOR FEVER blisters   venlafaxine XR (EFFEXOR-XR) 75 MG 24 hr capsule Take 3 capsules (225 mg total) by mouth daily with breakfast. TAKE 3 CAPSULES BY MOUTH ONCE DAILY WITH BREAKFAST   XIIDRA 5 % SOLN Place 1 drop into both eyes daily.   No facility-administered encounter medications on file as of 10/14/2022.   BP Readings from Last 3 Encounters:  10/06/22 (!) 146/83  09/11/22 (!) 156/78  08/19/22 (!) 144/86    Pulse Readings from Last 3 Encounters:  10/06/22 74  09/11/22 70  08/19/22 95    Lab Results  Component Value Date/Time   HGBA1C 5.3 06/26/2021 09:13 AM   HGBA1C 5.0 04/14/2018 11:24 AM   Lab Results  Component Value Date   CREATININE 1.00 09/11/2022   BUN 11 09/11/2022   GFRNONAA >60 09/11/2022   GFRAA 72 05/06/2020   NA 139 09/11/2022   K 4.1 09/11/2022   CALCIUM 8.8 (L) 09/11/2022   CO2 22 09/11/2022   Cycle dispensing form sent to Junius Argyle, CPP  for review.   Last adherence delivery date:  09/24/2022     Patient is due for next adherence delivery on: 10/23/2022 1st Route  This delivery to include: Adherence Packaging  30 Days  Meloxicam 15 mg tablet- Take one tablet by mouth once daily (Bedtime) Ezetimibe (Zetia) 10 mg tablet Take one tablet by mouth daily (Breakfast) Atorvastatin 40 mg tablet- Take one tablet by mouth daily (Breakfast) Carvedilol 12.5 mg tablet - Take on tablet by mouth twice daily (Breakfast, Bedtime) Mirtazapine 15 mg tablet-Take one tablet daily (Bedtime) Topiramate 25 mg tablet-Take one tablet by mouth twice daily (Breakfast, Bedtime) Gabapentin 400 mg tablet- Take one  tablet by mouth twice daily (Breakfast, Bedtime) Venlafaxine ER 75 mg capsule- Take three capsules by mouth daily (Breakfast) Omeprazole 40 mg- Take one capsule by mouth two times a day (Breakfast, Bedtime) Potassium 20 mEq - Take one capsule daily (Breakfast) Aripiprazole 5 mg tablet- Take one tablet by mouth daily (Breakfast) Leflunomide 20 mg tablet- Take one tablet by mouth once daily (Bedtime) Montelukast (Singulair) 10 mg tablet 1 tablet daily (Bedtime)  Patient declined the following medications this month: Albuterol Inhaler prn medication Breztri Inhaler-Patient receives this via [facility] program Linzess 145 mcg patient stated she has a sufficient supply  Refills requested from providers include: Mirtazapine 15 mg, Venlafaxine ER 75 mg,(Specialist medication refill request sent via [facility],  Omeprazole 40 mg (PCP medication) CPP can send in refill  Confirmed delivery date of 10/23/2022 1st Route, advised patient that pharmacy will contact them the morning of delivery.  Any concerns about your medications? No  How often do you forget or accidentally miss a dose? Never  Do you use a pillbox? No  Is patient in packaging Yes  Lynann Bologna, CPA/CMA Clinical Pharmacist Assistant Phone: [phone]

## 2022-10-15 ENCOUNTER — Ambulatory Visit: Payer: Medicare Other | Admitting: Pulmonary Disease

## 2022-10-15 ENCOUNTER — Encounter: Payer: Medicare Other | Admitting: *Deleted

## 2022-10-15 ENCOUNTER — Telehealth: Payer: Self-pay | Admitting: Psychiatry

## 2022-10-15 DIAGNOSIS — J449 Chronic obstructive pulmonary disease, unspecified: Secondary | ICD-10-CM | POA: Diagnosis not present

## 2022-10-15 NOTE — Telephone Encounter (Signed)
Please contact patient to schedule appointment I do not see an appointment scheduled.

## 2022-10-15 NOTE — Progress Notes (Signed)
Daily Session Note  Patient Details  Name: Gabrielle Pineda MRN: [id number] Date of Birth: 01/15/1956 Referring Provider:   April Manson Pulmonary Rehab from 07/27/2022 in [facility] Cardiac and Pulmonary Rehab  Referring Provider [name] MD       Encounter Date: 10/15/2022  Check In:  Session Check In - 10/15/22 0927       Check-In   Supervising physician immediately available to respond to emergencies See telemetry face sheet for immediately available ER MD    Location [facility]-Cardiac & Pulmonary Rehab    Staff Present Alberteen Sam, MA, RCEP, CCRP, Bertram Gala, MS, ACSM CEP, Exercise Physiologist;Laureen Owens Shark, BS, RRT, CPFT;Megan Tamala Julian, RN, [place];Other   Darel Hong RN BSN   Virtual Visit No    Medication changes reported     No    Fall or balance concerns reported    No    Warm-up and Cool-down Performed on first and last piece of equipment    Resistance Training Performed Yes    VAD Patient? No    PAD/SET Patient? No      Pain Assessment   Currently in Pain? No/denies                Social History   Tobacco Use  Smoking Status Former   Packs/day: 2.00   Years: 40.00   Total pack years: 80.00   Types: Cigarettes   Quit date: 10/30/2020   Years since quitting: 1.9  Smokeless Tobacco Never    Goals Met:  Independence with exercise equipment Exercise tolerated well No report of concerns or symptoms today Strength training completed today  Goals Unmet:  Not Applicable  Comments: Pt able to follow exercise prescription today without complaint.  Will continue to monitor for progression.    Dr. Emily Filbert is Medical Director for [place].  Dr. Ottie Glazier is Medical Director for [facility] Pulmonary Rehabilitation.

## 2022-10-19 ENCOUNTER — Ambulatory Visit: Payer: Medicare Other

## 2022-10-19 DIAGNOSIS — J449 Chronic obstructive pulmonary disease, unspecified: Secondary | ICD-10-CM

## 2022-10-19 DIAGNOSIS — I10 Essential (primary) hypertension: Secondary | ICD-10-CM

## 2022-10-19 NOTE — Progress Notes (Signed)
Care Management & Coordination Services Pharmacy Note  10/19/2022 Name:  Gabrielle Pineda MRN:  [id number] DOB:  27-Nov-1955  Summary: Patient presents for follow-up consult.   -Patient has had two COPD flares since last visit, but reports her breathing is much improved today. She is rarely using her rescue treatments. She has been using more treatments for allergic rhinitis symptoms.   -Patient reports good control of blood pressure. She checks blood pressure prior to pulmonary rehab and after pulmonary rehab is complete. Initially her blood pressures are 140s/90s, but improve to 120s/80s on recheck.   Recommendations/Changes made from today's visit: Continue current medications  Follow up plan: CPP follow-up 3 months  Subjective: Gabrielle Pineda is an 67 y.o. year old female who is a primary patient of Thedore Mins, Ria Comment, [place].  The care coordination team was consulted for assistance with disease management and care coordination needs.    Engaged with patient by telephone for follow up visit.  Recent office visits: 08/19/22: Patient presented to Mikey Kirschner, PA-C for COPD Exacerbation.  08/12/22: Patient presented to Raiford Noble, PA-C for COPD Exacerbation,.  06/08/22: Patient presented to Mikey Kirschner, PA-C for acute bronchitis.   Recent consult visits: 10/06/22: Patient presented to Dr. Holley Raring (Pain Mgmt)  09/11/22: Patient presented to Dr. Tasia Catchings (Oncology) for anemia.  08/13/22: Patient presented to Dr. Shea Evans [facility]). Buspar stopped.  07/17/22: Patient presented to Dr. Winfield Rast (Rheumatology)  07/07/22: Patient presented to Dr. Patsey Berthold (Pulmonology) for follow-up. Referral to pulmonary rehab.  05/28/22: Patient presented to Dr. Patsey Berthold (Pulmonology) for bronchitis. Methylprednisolone.  Hospital visits: None in previous 6 months   Objective:  Lab Results  Component Value Date   CREATININE 1.00 09/11/2022   BUN 11 09/11/2022   EGFR 63 11/17/2021   GFRNONAA  >60 09/11/2022   GFRAA 72 05/06/2020   NA 139 09/11/2022   K 4.1 09/11/2022   CALCIUM 8.8 (L) 09/11/2022   CO2 22 09/11/2022   GLUCOSE 110 (H) 09/11/2022    Lab Results  Component Value Date/Time   HGBA1C 5.3 06/26/2021 09:13 AM   HGBA1C 5.0 04/14/2018 11:24 AM    Last diabetic Eye exam: No results found for: "HMDIABEYEEXA"  Last diabetic Foot exam: No results found for: "HMDIABFOOTEX"   Lab Results  Component Value Date   CHOL 153 11/17/2021   HDL 40 11/17/2021   LDLCALC 79 11/17/2021   TRIG 202 (H) 11/17/2021   CHOLHDL 3.8 11/17/2021       Latest Ref Rng & Units 09/11/2022   11:42 AM 06/08/2022   12:40 PM 03/10/2022   10:11 AM  Hepatic Function  Total Protein 6.5 - 8.1 g/dL 6.8  6.9  6.2   Albumin 3.5 - 5.0 g/dL 3.7  3.5  3.8   AST 15 - 41 U/L 22  37  22   ALT 0 - 44 U/L '17  28  14   '$ Alk Phosphatase 38 - 126 U/L 99  113  103   Total Bilirubin 0.3 - 1.2 mg/dL 0.4  0.4  0.5     Lab Results  Component Value Date/Time   TSH 3.673 03/10/2022 10:11 AM   TSH 2.170 11/17/2021 09:59 AM   TSH 1.310 04/17/2019 10:37 AM   FREET4 0.82 11/17/2021 09:59 AM       Latest Ref Rng & Units 09/11/2022   11:42 AM 06/08/2022   12:40 PM 03/10/2022   10:11 AM  CBC  WBC 4.0 - 10.5 K/uL 4.4  11.3  5.7  Hemoglobin 12.0 - 15.0 g/dL 10.1  9.7  9.8   Hematocrit 36.0 - 46.0 % 32.6  31.7  31.1   Platelets 150 - 400 K/uL 253  195  223     Lab Results  Component Value Date/Time   VITAMINB12 380 09/11/2022 11:42 AM   VITAMINB12 674 02/20/2022 08:42 AM    Clinical ASCVD: No  The 10-year ASCVD risk score (Arnett DK, et al., 2019) is: 18.4%   Values used to calculate the score:     Age: 67 years     Sex: Female     Is Non-Hispanic African American: No     Diabetic: No     Tobacco smoker: Yes     Systolic Blood Pressure: 123456 mmHg     Is BP treated: Yes     HDL Cholesterol: 40 mg/dL     Total Cholesterol: 153 mg/dL       08/13/2022    9:40 AM 07/27/2022    2:36 PM 05/13/2022     2:19 PM  Depression screen PHQ 2/9  Decreased Interest 0 0 0  Down, Depressed, Hopeless 0 0 0  PHQ - 2 Score 0 0 0  Altered sleeping  0 0  Tired, decreased energy  0 1  Change in appetite  0 0  Feeling bad or failure about yourself   0 0  Trouble concentrating  0 0  Moving slowly or fidgety/restless  0 0  Suicidal thoughts  0 0  PHQ-9 Score  0 1  Difficult doing work/chores  Not difficult at all Not difficult at all     Social History   Tobacco Use  Smoking Status Former   Packs/day: 2.00   Years: 40.00   Total pack years: 80.00   Types: Cigarettes   Quit date: 10/30/2020   Years since quitting: 1.9  Smokeless Tobacco Never   BP Readings from Last 3 Encounters:  10/06/22 (!) 146/83  09/11/22 (!) 156/78  08/19/22 (!) 144/86   Pulse Readings from Last 3 Encounters:  10/06/22 74  09/11/22 70  08/19/22 95   Wt Readings from Last 3 Encounters:  10/06/22 170 lb (77.1 kg)  09/11/22 170 lb 6.4 oz (77.3 kg)  08/19/22 170 lb 8 oz (77.3 kg)   BMI Readings from Last 3 Encounters:  10/06/22 25.10 kg/m  09/11/22 26.69 kg/m  08/19/22 26.70 kg/m    Allergies  Allergen Reactions   Sulfasalazine Other (See Comments)   Plaquenil [Hydroxychloroquine] Rash    Medications Reviewed Today     Reviewed by Arlice Colt, RN (Registered Nurse) on 10/06/22 at [phone]  Med List Status: <None>   Medication Order Taking? Sig Documenting Provider Last Dose Status Informant  acyclovir ointment (ZOVIRAX) 5 % [id number] Yes :Topical Every 3 Hours as needed for lesion Mikey Kirschner, PA-C Taking Active   albuterol (PROVENTIL) (2.5 MG/3ML) 0.083% nebulizer solution [id number] Yes Take 3 mLs (2.5 mg total) by nebulization every 4 (four) hours as needed for wheezing or shortness of breath. Mikey Kirschner, PA-C Taking Active   albuterol (VENTOLIN HFA) 108 (90 Base) MCG/ACT inhaler [id number] Yes INHALE TWO PUFFS BY MOUTH INTO LUNGS every SIX hours AS NEEDED FOR WHEEZING AND/OR SHORTNESS OF  BREATH Mikey Kirschner, PA-C Taking Active   ARIPiprazole (ABILIFY) 5 MG tablet [id number] Yes Take 1 tablet (5 mg total) by mouth in the morning. Ursula Alert, MD Taking Active   atorvastatin (LIPITOR) 40 MG tablet [id number] Yes Take 1 tablet (40 mg total) by mouth  every morning. Minna Merritts, MD Taking Active   Budeson-Glycopyrrol-Formoterol (BREZTRI AEROSPHERE) 160-9-4.8 MCG/ACT Hollie Salk [id number] Yes Inhale 2 puffs into the lungs in the morning and at bedtime. Tyler Pita, MD Taking Active   carvedilol (COREG) 12.5 MG tablet [id number] Yes TAKE ONE TABLET BY MOUTH EVERY MORNING and TAKE ONE TABLET BY MOUTH EVERYDAY AT BEDTIME Gollan, Kathlene November, MD Taking Active   ezetimibe (ZETIA) 10 MG tablet [id number] Yes Take 1 tablet (10 mg total) by mouth every morning. Minna Merritts, MD Taking Active   gabapentin (NEURONTIN) 400 MG capsule [id number] Yes Take 1 capsule (400 mg total) by mouth 2 (two) times daily. [name], MD Taking Active   leflunomide (ARAVA) 20 MG tablet [id number] Yes Take 20 mg by mouth at bedtime. [provider] Taking Active   LINZESS 145 MCG CAPS capsule [id number] Yes Take 145 mcg by mouth daily before breakfast. [provider] Taking Active Self  meloxicam (MOBIC) 15 MG tablet [id number] Yes Take 15 mg by mouth at bedtime. [provider] Taking Active   mirtazapine (REMERON) 15 MG tablet [id number] Yes TAKE ONE TABLET BY MOUTH EVERYDAY AT BEDTIME Eappen, Saramma, MD Taking Active   montelukast (SINGULAIR) 10 MG tablet [id number] Yes Take 1 tablet (10 mg total) by mouth at bedtime. Gwyneth Sprout, FNP Taking Active   Multiple Vitamin (MULTIVITAMIN WITH MINERALS) TABS tablet [id number] Yes Take 1 tablet by mouth daily. [provider] Taking Active Self  Nebulizer [place] [id number] Yes Compressor nebulizer to use with nebulized medications as instructed Drubel, Ria Comment, PA-C Taking Active   omeprazole (PRILOSEC) 40 MG capsule [id number]  Yes Take 1 capsule (40 mg total) by mouth 2 (two) times daily. Mikey Kirschner, PA-C Taking Active   oxyCODONE-acetaminophen (PERCOCET) 5-325 MG tablet [id number] Yes Take 1 tablet by mouth every 8 (eight) hours as needed for severe pain. Must last 30 days. [name], MD Taking Active   potassium chloride SA (KLOR-CON M) 20 MEQ tablet [id number] Yes Take 1 tablet (20 mEq total) by mouth daily with breakfast. Mikey Kirschner, PA-C Taking Active   [facility] syringe [id number] Yes  [provider] Taking Active   topiramate (TOPAMAX) 25 MG tablet [id number] Yes Take 1 tablet (25 mg total) by mouth 2 (two) times daily. [name], MD Taking Active   valACYclovir (VALTREX) 1000 MG tablet [id number] Yes TAKE TWO TABLETS BY MOUTH TWICE DAILY AS NEEDED FOR 5 DAYS FOR FEVER blisters Drubel, Ria Comment, PA-C Taking Active   venlafaxine XR (EFFEXOR-XR) 75 MG 24 hr capsule [id number] Yes Take 3 capsules (225 mg total) by mouth daily with breakfast. TAKE 3 CAPSULES BY MOUTH ONCE DAILY WITH Eldridge Abrahams, MD Taking Active   XIIDRA 5 % SOLN [id number] Yes Place 1 drop into both eyes daily. [provider] Taking Active Self  Med List Note Dewayne Shorter, RN 04/07/22 [id number]): UDS 08/23/21 Mr12-04-2022            SDOH:  (Social Determinants of Health) assessments and interventions performed: Yes SDOH Interventions    Flowsheet Row Office Visit from 04/21/2022 in [place] from 11/03/2021 in [place] [facility] from 09/26/2021 in [place] [facility] from 07/25/2021 in [place] [facility] from 05/23/2021 in [place] [facility] from 03/28/2021 in [place]  Insecurity Interventions -- Intervention Not Indicated -- -- -- --   Housing Interventions -- Intervention Not Indicated -- -- -- --  Transportation Interventions -- Intervention Not Indicated -- -- -- --  Depression Interventions/Treatment  PHQ2-9 Score <4 Follow-up Not Indicated -- -- -- -- --  Financial Strain Interventions -- Intervention Not Indicated Intervention Not Indicated Intervention Not Indicated Intervention Not Indicated Intervention Not Indicated  Physical Activity Interventions -- Patient Refused -- -- -- --  Stress Interventions -- Intervention Not Indicated -- -- -- --  Social Connections Interventions -- Intervention Not Indicated -- -- -- --       Medication Assistance: None required.  Patient affirms current coverage meets needs.  [facility] Reviewed: Current Rx insurance plan: [employer] Name and location of Current pharmacy:  [facility] - [place], [place] - [place] [address] [place] [address] Phone: [phone] Fax: [phone]  [place] [address] [address] [place] [address] [place] [address] Phone: [phone] Fax: [place], [place] [address] [place] [address] Phone: [phone] Fax: [phone]  Reason patient declined to change pharmacies: Patient is already actively enrolled with [facility]  Compliance/Adherence/Medication fill history: Care Gaps: Shingrix  Covid Booster  AWV   Star-Rating Drugs: Atorvastatin 40 mg: Last filled 09/22/22 for 30-DS  Assessment/Plan  Hypertension (BP goal <140/90) -Uncontrolled -Current treatment: Carvedilol 12.5 mg twice daily  Furosemide 20 mg daily as needed  -Medications previously tried: Diltiazem,   -Current home readings:  -Patient reports good control of blood pressure. She checks blood pressure prior to pulmonary rehab and after pulmonary  rehab is complete. Initially her blood pressures are 140s/90s, but improve to 120s/80s on recheck.   -Denies hypotensive/hypertensive symptoms -Dietary Habits: Picky eater, doesn't like many vegetables. Rarely cooks at home, but sister will cook for her. Denies caffeine intake.   Supper: Steak + Potatoes + Corn OR Mac and Cheese  -Recommended to continue current medication for now.  Hyperlipidemia: (LDL goal < 70) -Controlled -Current treatment: Atorvastatin 40 mg daily  Ezetimibe 10 mg daily  -Medications previously tried: Pravastatin, Rosuvastatin  -Educated on Benefits of statin for ASCVD risk reduction; -Recommended to continue current medication  COPD (Goal: control symptoms and prevent exacerbations) -Uncontrolled -Current treatment  Ipratropium 0.02% nebulizer 2.5 mL every 4 hours Albuterol 2 puffs every 6 hours as needed  Breztri 2 puffs twice daily  Montelukast 10 mg nightly.  -Medications previously tried: NA  -Gold Grade: Gold 2 (FEV1 50-79%) -Current COPD Classification:  B (high sx, <2 exacerbations/yr) -MMRC/CAT score: NA -Pulmonary function testing: 71% (2017) -Exacerbations requiring treatment in last 6 months: Frequent exacerbations requiring outpt tx  -Frequency of rescue nebulizer use: <1x monthly.   -Frequency of rescue inhaler use: <1x monthly.  -Recommended to continue current medication  Depression/Anxiety (Goal: Maintain symptom remission) -Controlled -Current treatment: Aripiprazole 5 mg daily  Buspirone 30 mg three times daily  Mirtazapine 15 mg nightly  Venlafaxine XR 75 mg 3 capsules daily -Medications previously tried/failed: Sertraline, Buspar  -PHQ9: 6 -GAD7: 9 -Continue current medications   Osteopenia (Goal Prevent fractures) -Not ideally controlled -Last DEXA Scan: 12/23/21   T-Score femoral neck: NA  T-Score total hip: -1.3  T-Score lumbar spine: -1.2  T-Score forearm radius: -1.3  10-year probability of major osteoporotic fracture:  17.6%  10-year probability of hip fracture: 1.8% -Patient is not a candidate for pharmacologic treatment -Current treatment  None -Medications previously tried: NA  -Recommend [phone] units of vitamin D daily. Recommend 1200 mg of calcium daily from dietary and supplemental sources.  Junius Argyle, PharmD, Para [place], CPP  Clinical Pharmacist Practitioner  [facility] [phone]

## 2022-10-20 ENCOUNTER — Ambulatory Visit
Admission: RE | Admit: 2022-10-20 | Discharge: 2022-10-20 | Disposition: A | Discharge status: Home / Self Care | Payer: Medicare Other | Source: Ambulatory Visit | Attending: Student in an Organized Health Care Education/Training Program | Admitting: Student in an Organized Health Care Education/Training Program

## 2022-10-20 DIAGNOSIS — M5416 Radiculopathy, lumbar region: Secondary | ICD-10-CM

## 2022-10-20 DIAGNOSIS — G8929 Other chronic pain: Secondary | ICD-10-CM

## 2022-10-20 DIAGNOSIS — M961 Postlaminectomy syndrome, not elsewhere classified: Secondary | ICD-10-CM

## 2022-10-20 DIAGNOSIS — G894 Chronic pain syndrome: Secondary | ICD-10-CM

## 2022-10-21 ENCOUNTER — Other Ambulatory Visit: Payer: Self-pay | Admitting: Family Medicine

## 2022-10-21 DIAGNOSIS — J449 Chronic obstructive pulmonary disease, unspecified: Secondary | ICD-10-CM

## 2022-10-22 ENCOUNTER — Encounter: Payer: Medicare Other | Attending: Physician Assistant | Admitting: *Deleted

## 2022-10-22 DIAGNOSIS — J449 Chronic obstructive pulmonary disease, unspecified: Secondary | ICD-10-CM | POA: Insufficient documentation

## 2022-10-22 NOTE — Progress Notes (Signed)
Daily Session Note  Patient Details  Name: Gabrielle Pineda MRN: [id number] Date of Birth: [birth year] Referring Provider:   Flowsheet Row Pulmonary Rehab from 07/27/2022 in [facility] Cardiac and Pulmonary Rehab  Referring Provider [name] MD       Encounter Date: 10/22/2022  Check In:  Session Check In - 10/22/22 0935       Check-In   Supervising physician immediately available to respond to emergencies See telemetry face sheet for immediately available ER MD    Location [facility]-Cardiac & Pulmonary Rehab    Staff Present Darlyne Russian, RN, ADN;Jessica Luan Pulling, MA, RCEP, CCRP, Bertram Gala, MS, ACSM CEP, Exercise Physiologist    Virtual Visit No    Medication changes reported     No    Fall or balance concerns reported    No    Warm-up and Cool-down Performed on first and last piece of equipment    Resistance Training Performed Yes    VAD Patient? No    PAD/SET Patient? No      Pain Assessment   Currently in Pain? No/denies                Social History   Tobacco Use  Smoking Status Former   Packs/day: 2.00   Years: 40.00   Total pack years: 80.00   Types: Cigarettes   Quit date: 10/30/2020   Years since quitting: 1.9  Smokeless Tobacco Never    Goals Met:  Independence with exercise equipment Exercise tolerated well No report of concerns or symptoms today Strength training completed today  Goals Unmet:  Not Applicable  Comments: Pt able to follow exercise prescription today without complaint.  Will continue to monitor for progression.    Dr. Emily Filbert is Medical Director for [place].  Dr. Ottie Glazier is Medical Director for [facility] Pulmonary Rehabilitation.

## 2022-10-26 DIAGNOSIS — Z79899 Other long term (current) drug therapy: Secondary | ICD-10-CM | POA: Diagnosis not present

## 2022-10-26 DIAGNOSIS — M0579 Rheumatoid arthritis with rheumatoid factor of multiple sites without organ or systems involvement: Secondary | ICD-10-CM | POA: Diagnosis not present

## 2022-10-27 ENCOUNTER — Encounter: Payer: Medicare Other | Admitting: *Deleted

## 2022-10-27 DIAGNOSIS — J449 Chronic obstructive pulmonary disease, unspecified: Secondary | ICD-10-CM | POA: Diagnosis not present

## 2022-10-27 NOTE — Progress Notes (Signed)
Daily Session Note  Patient Details  Name: Gabrielle Pineda MRN: [id number] Date of Birth: Dec 28, 1955 Referring Provider:   Flowsheet Row Pulmonary Rehab from 07/27/2022 in [facility] Cardiac and Pulmonary Rehab  Referring Provider [name] MD       Encounter Date: 10/27/2022  Check In:  Session Check In - 10/27/22 1004       Check-In   Supervising physician immediately available to respond to emergencies See telemetry face sheet for immediately available ER MD    Location [facility]-Cardiac & Pulmonary Rehab    Staff Present Heath Lark, RN, BSN, CCRP;Jessica [place], MA, RCEP, CCRP, Bertram Gala, MS, ACSM CEP, Exercise Physiologist    Virtual Visit No    Medication changes reported     No    Fall or balance concerns reported    No    Warm-up and Cool-down Performed on first and last piece of equipment    Resistance Training Performed Yes    VAD Patient? No    PAD/SET Patient? No      Pain Assessment   Currently in Pain? No/denies                Social History   Tobacco Use  Smoking Status Former   Packs/day: 2.00   Years: 40.00   Total pack years: 80.00   Types: Cigarettes   Quit date: 10/30/2020   Years since quitting: 1.9  Smokeless Tobacco Never    Goals Met:  Proper associated with RPD/PD & O2 Sat Independence with exercise equipment Exercise tolerated well No report of concerns or symptoms today  Goals Unmet:  Not Applicable  Comments: Pt able to follow exercise prescription today without complaint.  Will continue to monitor for progression.    Dr. Emily Filbert is Medical Director for [place].  Dr. Ottie Glazier is Medical Director for [facility] Pulmonary Rehabilitation.

## 2022-10-29 ENCOUNTER — Encounter: Payer: Medicare Other | Admitting: *Deleted

## 2022-10-29 DIAGNOSIS — J449 Chronic obstructive pulmonary disease, unspecified: Secondary | ICD-10-CM | POA: Diagnosis not present

## 2022-10-29 NOTE — Progress Notes (Signed)
Daily Session Note  Patient Details  Name: Gabrielle Pineda MRN: [id number] Date of Birth: 1956/04/05 Referring Provider:   Flowsheet Row Pulmonary Rehab from 07/27/2022 in [facility] Cardiac and Pulmonary Rehab  Referring Provider [name] MD       Encounter Date: 10/29/2022  Check In:  Session Check In - 10/29/22 0928       Check-In   Supervising physician immediately available to respond to emergencies See telemetry face sheet for immediately available ER MD    Location [facility]-Cardiac & Pulmonary Rehab    Staff Present Darlyne Russian, RN, Lorin Mercy, MS, ACSM CEP, Exercise Physiologist;Joseph Tessie Fass, [place]    Virtual Visit No    Medication changes reported     No    Fall or balance concerns reported    No    Warm-up and Cool-down Performed on first and last piece of equipment    Resistance Training Performed Yes    VAD Patient? No    PAD/SET Patient? No      Pain Assessment   Currently in Pain? No/denies                Social History   Tobacco Use  Smoking Status Former   Packs/day: 2.00   Years: 40.00   Additional pack years: 0.00   Total pack years: 80.00   Types: Cigarettes   Quit date: 10/30/2020   Years since quitting: 1.9  Smokeless Tobacco Never    Goals Met:  Independence with exercise equipment Exercise tolerated well No report of concerns or symptoms today Strength training completed today  Goals Unmet:  Not Applicable  Comments: Pt able to follow exercise prescription today without complaint.  Will continue to monitor for progression.    Dr. Emily Filbert is Medical Director for [place].  Dr. Ottie Glazier is Medical Director for [facility] Pulmonary Rehabilitation.

## 2022-10-30 ENCOUNTER — Ambulatory Visit
Admission: RE | Admit: 2022-10-30 | Discharge: 2022-10-30 | Disposition: A | Discharge status: Home / Self Care | Payer: Medicare Other | Source: Ambulatory Visit | Attending: Student in an Organized Health Care Education/Training Program | Admitting: Student in an Organized Health Care Education/Training Program

## 2022-10-30 DIAGNOSIS — M961 Postlaminectomy syndrome, not elsewhere classified: Secondary | ICD-10-CM | POA: Insufficient documentation

## 2022-10-30 DIAGNOSIS — M47816 Spondylosis without myelopathy or radiculopathy, lumbar region: Secondary | ICD-10-CM | POA: Diagnosis not present

## 2022-10-30 DIAGNOSIS — M545 Low back pain, unspecified: Secondary | ICD-10-CM | POA: Diagnosis not present

## 2022-10-30 DIAGNOSIS — G894 Chronic pain syndrome: Secondary | ICD-10-CM | POA: Diagnosis not present

## 2022-10-30 DIAGNOSIS — M5416 Radiculopathy, lumbar region: Secondary | ICD-10-CM | POA: Diagnosis not present

## 2022-10-30 DIAGNOSIS — G8929 Other chronic pain: Secondary | ICD-10-CM | POA: Insufficient documentation

## 2022-10-30 DIAGNOSIS — M4316 Spondylolisthesis, lumbar region: Secondary | ICD-10-CM | POA: Diagnosis not present

## 2022-11-03 ENCOUNTER — Encounter: Payer: Medicare Other | Admitting: *Deleted

## 2022-11-03 DIAGNOSIS — J449 Chronic obstructive pulmonary disease, unspecified: Secondary | ICD-10-CM

## 2022-11-03 NOTE — Progress Notes (Signed)
Daily Session Note  Patient Details  Name: Gabrielle Pineda MRN: [id number] Date of Birth: [birth year] Referring Provider:   Flowsheet Row Pulmonary Rehab from 07/27/2022 in [facility] Cardiac and Pulmonary Rehab  Referring Provider Gabrielle Gambles MD       Encounter Date: 11/03/2022  Check In:  Session Check In - 11/03/22 1037       Check-In   Supervising physician immediately available to respond to emergencies See telemetry face sheet for immediately available ER MD    Location [facility]-Cardiac & Pulmonary Rehab    Staff Present Heath Lark, RN, BSN, CCRP;Jessica [place], MA, RCEP, CCRP, Bertram Gala, MS, ACSM CEP, Exercise Physiologist    Virtual Visit No    Medication changes reported     No    Fall or balance concerns reported    No    Warm-up and Cool-down Performed on first and last piece of equipment    Resistance Training Performed Yes    VAD Patient? No    PAD/SET Patient? No      Pain Assessment   Currently in Pain? No/denies                Social History   Tobacco Use  Smoking Status Former   Packs/day: 2.00   Years: 40.00   Additional pack years: 0.00   Total pack years: 80.00   Types: Cigarettes   Quit date: 10/30/2020   Years since quitting: 2.0  Smokeless Tobacco Never    Goals Met:  Proper associated with RPD/PD & O2 Sat Independence with exercise equipment Exercise tolerated well No report of concerns or symptoms today  Goals Unmet:  Not Applicable  Comments: Pt able to follow exercise prescription today without complaint.  Will continue to monitor for progression.    Dr. Emily Pineda is Medical Director for [place].  Dr. Ottie Pineda is Medical Director for [facility] Pulmonary Rehabilitation.

## 2022-11-04 ENCOUNTER — Encounter: Payer: Self-pay | Admitting: *Deleted

## 2022-11-04 DIAGNOSIS — J449 Chronic obstructive pulmonary disease, unspecified: Secondary | ICD-10-CM

## 2022-11-04 NOTE — Progress Notes (Signed)
Pulmonary Individual Treatment Plan  Patient Details  Name: BAYLYNN NARDUCCI MRN: [id number] Date of Birth: [birth year] Referring Provider:   Flowsheet Row Pulmonary Rehab [facility]27/[facility]  Referring Provider [name] MD       Initial Encounter Date:  Flowsheet Row Pulmonary Rehab from 07/27/2022 in [facility] Cardiac and Pulmonary Rehab  Date 07/27/22       Visit Diagnosis: Chronic obstructive pulmonary disease, unspecified COPD type ([place])  Patient's Home Medications on Admission:  Current Outpatient Medications:    montelukast (SINGULAIR) 10 MG tablet, TAKE ONE TABLET BY MOUTH EVERYDAY AT BEDTIME, Disp: 30 tablet, Rfl: 11   acyclovir ointment (ZOVIRAX) 5 %, :Topical Every 3 Hours as needed for lesion, Disp: 30 g, Rfl: 1   albuterol (PROVENTIL) (2.5 MG/3ML) 0.083% nebulizer solution, Take 3 mLs (2.5 mg total) by nebulization every 4 (four) hours as needed for wheezing or shortness of breath., Disp: 225 mL, Rfl: 2   albuterol (VENTOLIN HFA) 108 (90 Base) MCG/ACT inhaler, INHALE TWO PUFFS BY MOUTH INTO LUNGS every SIX hours AS NEEDED FOR WHEEZING AND/OR SHORTNESS OF BREATH, Disp: 8.5 g, Rfl: 2   ARIPiprazole (ABILIFY) 5 MG tablet, Take 1 tablet (5 mg total) by mouth in the morning., Disp: 90 tablet, Rfl: 1   atorvastatin (LIPITOR) 40 MG tablet, Take 1 tablet (40 mg total) by mouth every morning., Disp: 90 tablet, Rfl: 3   Budeson-Glycopyrrol-Formoterol (BREZTRI AEROSPHERE) 160-9-4.8 MCG/ACT AERO, Inhale 2 puffs into the lungs in the morning and at bedtime., Disp: 10.7 g, Rfl: 11   carvedilol (COREG) 12.5 MG tablet, TAKE ONE TABLET BY MOUTH EVERY MORNING and TAKE ONE TABLET BY MOUTH EVERYDAY AT BEDTIME, Disp: 180 tablet, Rfl: 3   cetirizine (ZYRTEC) 10 MG tablet, Take 10 mg by mouth at bedtime., Disp: , Rfl:    dextromethorphan-guaiFENesin (MUCINEX DM) 30-600 MG 12hr tablet, Take 1 tablet by mouth 2 (two) times daily., Disp: , Rfl:    ezetimibe  (ZETIA) 10 MG tablet, Take 1 tablet (10 mg total) by mouth every morning., Disp: 90 tablet, Rfl: 3   gabapentin (NEURONTIN) 400 MG capsule, Take 1 capsule (400 mg total) by mouth 2 (two) times daily., Disp: 60 capsule, Rfl: 5   leflunomide (ARAVA) 20 MG tablet, Take 20 mg by mouth at bedtime., Disp: , Rfl:    LINZESS 145 MCG CAPS capsule, Take 145 mcg by mouth daily before breakfast., Disp: , Rfl:    meloxicam (MOBIC) 15 MG tablet, Take 15 mg by mouth at bedtime., Disp: , Rfl:    mirtazapine (REMERON) 15 MG tablet, TAKE ONE TABLET BY MOUTH EVERYDAY AT BEDTIME, Disp: 90 tablet, Rfl: 0   Multiple Vitamin (MULTIVITAMIN WITH MINERALS) TABS tablet, Take 1 tablet by mouth daily., Disp: , Rfl:    Nebulizer MISC, Compressor nebulizer to use with nebulized medications as instructed, Disp: 1 each, Rfl: 0   omeprazole (PRILOSEC) 40 MG capsule, TAKE ONE CAPSULE BY MOUTH EVERY MORNING and TAKE ONE CAPSULE BY MOUTH EVERYDAY AT BEDTIME, Disp: 180 capsule, Rfl: 1   oxyCODONE-acetaminophen (PERCOCET) 5-325 MG tablet, Take 1 tablet by mouth every 8 (eight) hours as needed for severe pain. Must last 30 days., Disp: 90 tablet, Rfl: 0   [START ON 11/29/2022] oxyCODONE-acetaminophen (PERCOCET) 5-325 MG tablet, Take 1 tablet by mouth every 8 (eight) hours as needed for severe pain. Must last 30 days., Disp: 90 tablet, Rfl: 0   [START ON 12/29/2022] oxyCODONE-acetaminophen (PERCOCET) 5-325 MG tablet, Take 1 tablet  by mouth every 8 (eight) hours as needed for severe pain. Must last 30 days., Disp: 90 tablet, Rfl: 0   Oxymetazoline HCl (MUCINEX NASAL SPRAY MOISTURE NA), Place into the nose., Disp: , Rfl:    potassium chloride SA (KLOR-CON M) 20 MEQ tablet, Take 1 tablet (20 mEq total) by mouth daily with breakfast., Disp: 90 tablet, Rfl: 1   SPIKEVAX syringe, , Disp: , Rfl:    topiramate (TOPAMAX) 25 MG tablet, Take 1 tablet (25 mg total) by mouth 2 (two) times daily., Disp: 60 tablet, Rfl: 11   valACYclovir (VALTREX) 1000 MG  tablet, TAKE TWO TABLETS BY MOUTH TWICE DAILY AS NEEDED FOR 5 DAYS FOR FEVER blisters, Disp: 30 tablet, Rfl: 1   venlafaxine XR (EFFEXOR-XR) 75 MG 24 hr capsule, TAKE THREE CAPSULES BY MOUTH EVERY MORNING, Disp: 270 capsule, Rfl: 0   XIIDRA 5 % SOLN, Place 1 drop into both eyes daily., Disp: , Rfl:   Past Medical History: Past Medical History:  Diagnosis Date   Alcohol abuse    Anemia    Anxiety    Arthritis    Cervicalgia    Cirrhosis ([place]) 1994   COPD (chronic obstructive pulmonary disease) ([place])    Depression    Difficult intubation    has plates and screws in neck   Dyspnea    GERD (gastroesophageal reflux disease)    Headache    Heart murmur    on heard when pt is lying   Hypertension    Other and unspecified hyperlipidemia    Status post insertion of spinal cord stimulator    Tachycardia    d/t questionable anxiety happens every 5- 10 years, sees [facility]    Tobacco Use: Social History   Tobacco Use  Smoking Status Former   Packs/day: 2.00   Years: 40.00   Additional pack years: 0.00   Total pack years: 80.00   Types: Cigarettes   Quit date: 10/30/2020   Years since quitting: 2.0  Smokeless Tobacco Never    Labs: Review Flowsheet  More data exists      Latest Ref Rng & Units 04/17/2019 04/19/2020 03/17/2021 06/26/2021 11/17/2021  Labs for ITP Cardiac and Pulmonary Rehab  Cholestrol 100 - 199 mg/dL 158  172  - 120  153   LDL (calc) 0 - 99 mg/dL 80  98  - 50  79   HDL-C >39 mg/dL 54  56  - 39  40   Trlycerides 0 - 149 mg/dL 135  102  - 190  202   Hemoglobin A1c 4.8 - 5.6 % - - - 5.3  -  TCO2 22 - 32 mmol/L - - 21  - -     Pulmonary Assessment Scores:  Pulmonary Assessment Scores     Row Name 07/27/22 1435         ADL UCSD   ADL Phase Entry     SOB Score total 71     Rest 0     Walk 2     Stairs 5     Bath 2     Dress 3     Shop 4       CAT Score   CAT Score 17       mMRC Score   mMRC Score 2               UCSD: Self-administered rating of dyspnea associated with activities of daily living (ADLs) 6-point scale (0 = "not at all" to  5 = "maximal or unable to do because of breathlessness")  Scoring Scores range from 0 to 120.  Minimally important difference is 5 units  CAT: CAT can identify the health impairment of COPD patients and is better correlated with disease progression.  CAT has a scoring range of zero to 40. The CAT score is classified into four groups of low (less than 10), medium (10 - 20), high (21-30) and very high (31-40) based on the impact level of disease on health status. A CAT score over 10 suggests significant symptoms.  A worsening CAT score could be explained by an exacerbation, poor medication adherence, poor inhaler technique, or progression of COPD or comorbid conditions.  CAT MCID is 2 points  mMRC: mMRC (Modified Medical Research Council) Dyspnea Scale is used to assess the degree of baseline functional disability in patients of respiratory disease due to dyspnea. No minimal important difference is established. A decrease in score of 1 point or greater is considered a positive change.   Pulmonary Function Assessment:  Pulmonary Function Assessment - 07/27/22 1433       Pulmonary Function Tests   FVC% 64 %   Collected 02/04/22   FEV1% 54 %    FEV1/FVC Ratio 64    RV% 144 %    DLCO% 51 %      Breath   Shortness of Breath Yes;Limiting activity;Fear of Shortness of Breath             Exercise Target Goals: Exercise Program Goal: Individual exercise prescription set using results from initial 6 min walk test and THRR while considering  patient's activity barriers and safety.   Exercise Prescription Goal: Initial exercise prescription builds to 30-45 minutes a day of aerobic activity, 2-3 days per week.  Home exercise guidelines will be given to patient during program as part of exercise prescription that the participant will acknowledge.  Education: Aerobic  Exercise: - Group verbal and visual presentation on the components of exercise prescription. Introduces F.I.T.T principle from ACSM for exercise prescriptions.  Reviews F.I.T.T. principles of aerobic exercise including progression. Written material given at graduation. Flowsheet Row Pulmonary Rehab from 10/29/2022 in [facility] Card[facility] Pulmonary Rehab  Date 09/03/22  Educator [facility]  Instruction Review Code 1- Verbalizes Understanding       Education: Resistance Exercise: - Group verbal and visual presentation on the components of exercise prescription. Introduces F.I.T.T principle from ACSM for exercise prescriptions  Reviews F.I.T.T. principles of resistance exercise including progression. Written material given at graduation. Flowsheet Row Pulmonary Rehab from 10/29/2022 in [facility] Cardiac and Pulmonary Rehab  Date 09/10/22  Educator [facility]  Instruction Review Code 1- Verbalizes Understanding        Education: Exercise & Equipment Safety: - Individual verbal instruction and demonstration of equipment use and safety with use of the equipment. Flowsheet Row Pulmonary Rehab from 10/29/2022 in [facility] Cardiac and Pulmonary Rehab  Date 07/16/22  Educator [facility]  Instruction Review Code 1- Verbalizes Understanding       Education: Exercise Physiology & General Exercise Guidelines: - Group verbal and written instruction with models to review the exercise physiology of the cardiovascular system and associated critical values. Provides general exercise guidelines with specific guidelines to those with heart or lung disease.    Education: Flexibility, Balance, Mind/Body Relaxation: - Group verbal and visual presentation with interactive activity on the components of exercise prescription. Introduces F.I.T.T principle from ACSM for exercise prescriptions. Reviews F.I.T.T. principles of flexibility and balance exercise training including progression. Also discusses  the mind body connection.  Reviews various  relaxation techniques to help reduce and manage stress (i.e. Deep breathing, progressive muscle relaxation, and visualization). Balance handout provided to take home. Written material given at graduation. Flowsheet Row Pulmonary Rehab from 10/29/2022 in [facility] Cardiac and Pulmonary Rehab  Date 09/17/22  Educator [facility]  Instruction Review Code 1- Verbalizes Understanding       Activity Barriers & Risk Stratification:   6 Minute Walk:  6 Minute Walk     Row Name 07/27/22 1425         6 Minute Walk   Phase Initial     Distance 1035 feet     Walk Time 6 minutes     # of Rest Breaks 0     MPH 1.96     METS 3.24     RPE 14     Perceived Dyspnea  2     VO2 Peak 11.33     Symptoms Yes (comment)     Comments Hip pain 6/10     Resting HR 70 bpm     Resting BP 126/70     Resting Oxygen Saturation  94 %     Exercise Oxygen Saturation  during 6 min walk 92 %     Max Ex. HR 110 bpm     Max Ex. BP 184/74     2 Minute Post BP 154/72       Interval HR   1 Minute HR 96     2 Minute HR 100     3 Minute HR 99     4 Minute HR 97     5 Minute HR 103     6 Minute HR 110     2 Minute Post HR 77     Interval Heart Rate? Yes       Interval Oxygen   Interval Oxygen? Yes     Baseline Oxygen Saturation % 94 %     1 Minute Oxygen Saturation % 93 %     1 Minute Liters of Oxygen 0 L  Room Air     2 Minute Oxygen Saturation % 92 %     2 Minute Liters of Oxygen 0 L     3 Minute Oxygen Saturation % 93 %     3 Minute Liters of Oxygen 0 L     4 Minute Oxygen Saturation % 93 %     4 Minute Liters of Oxygen 0 L     5 Minute Oxygen Saturation % 93 %     5 Minute Liters of Oxygen 0 L     6 Minute Oxygen Saturation % 93 %     6 Minute Liters of Oxygen 0 L     2 Minute Post Oxygen Saturation % 94 %     2 Minute Post Liters of Oxygen 0 L             Oxygen Initial Assessment:  Oxygen Initial Assessment - 07/16/22 0910       Home Oxygen   Home Oxygen Device Home Concentrator    Sleep  Oxygen Prescription Continuous    Liters per minute 2    Home Exercise Oxygen Prescription None    Home Resting Oxygen Prescription None    Compliance with Home Oxygen Use Yes      Initial 6 min Walk   Oxygen Used None      Program Oxygen Prescription   Program Oxygen Prescription None  Intervention   Short Term Goals To learn and exhibit compliance with exercise, home and travel O2 prescription;To learn and understand importance of maintaining oxygen saturations>88%;To learn and demonstrate proper pursed lip breathing techniques or other breathing techniques. ;To learn and understand importance of monitoring SPO2 with pulse oximeter and demonstrate accurate use of the pulse oximeter.;To learn and demonstrate proper use of respiratory medications    Long  Term Goals Exhibits compliance with exercise, home  and travel O2 prescription;Maintenance of O2 saturations>88%;Compliance with respiratory medication;Demonstrates proper use of MDI's;Exhibits proper breathing techniques, such as pursed lip breathing or other method taught during program session;Verbalizes importance of monitoring SPO2 with pulse oximeter and return demonstration             Oxygen Re-Evaluation:  Oxygen Re-Evaluation     Row Name 08/04/22 0953 09/01/22 0945 09/17/22 1204 10/13/22 0950       Program Oxygen Prescription   Program Oxygen Prescription Continuous Continuous Continuous None    Liters per minute 2 2 2  --    Comments sleep only sleep only sleep only --      Home Oxygen   Home Oxygen Device Home Concentrator Home Concentrator Home Concentrator Home Concentrator    Sleep Oxygen Prescription Continuous Continuous Continuous Continuous    Liters per minute 2 2 2 2     Home Exercise Oxygen Prescription None None None None    Home Resting Oxygen Prescription None None None None    Compliance with Home Oxygen Use Yes Yes Yes Yes      Goals/Expected Outcomes   Short Term Goals To learn and demonstrate  proper pursed lip breathing techniques or other breathing techniques.  To learn and demonstrate proper pursed lip breathing techniques or other breathing techniques.  To learn and demonstrate proper pursed lip breathing techniques or other breathing techniques.  To learn and demonstrate proper pursed lip breathing techniques or other breathing techniques.     Long  Term Goals Exhibits proper breathing techniques, such as pursed lip breathing or other method taught during program session Exhibits proper breathing techniques, such as pursed lip breathing or other method taught during program session Exhibits proper breathing techniques, such as pursed lip breathing or other method taught during program session Exhibits proper breathing techniques, such as pursed lip breathing or other method taught during program session    Comments Reviewed PLB technique with pt.  Talked about how it works and it's importance in maintaining their exercise saturations. Gibson is still on 2L of O2 during sleep. She reports that she used her oxygen more during the day while she was sick, but she has returned to only using it during sleep. We reviewd PLB technique and she was encouraged to practice PLB during exercise. She has a pulse-ox at home and she states that she has been checking her O2 saturations at home. She states that her O2 stayed around 92% while she was sick, but since she has gotten better it has been ranging between 95-96% at rest. Diaphragmatic and PLB breathing explained and performed with patient. Patient has a better understanding of how to do these exercises to help with breathing performance and relaxation. Patient performed breathing techniques adequately and to practice further at home. Shermaine reports her shortness of breath has improved since starting the program. She practices PLB and finds that it helps, she has a pulse oximeter at home; reviewed what to look for and troubleshooting.    Goals/Expected  Outcomes Short: Become more profiecient at using  PLB. Long: Become independent at using PLB. Short: Become more profiecient at using PLB. Long: Become independent at using PLB. Short: practice PLB and diaphragmatic breathing at home. Long: Use PLB and diaphragmatic breathing independently post [employer]. Short: practice PLB and diaphragmatic breathing at home. Long: Use PLB and diaphragmatic breathing independently post [employer].             Oxygen Discharge (Final Oxygen Re-Evaluation):  Oxygen Re-Evaluation - 10/13/22 0950       Program Oxygen Prescription   Program Oxygen Prescription None    Liters per minute --    Comments --      Home Oxygen   Home Oxygen Device Home Concentrator    Sleep Oxygen Prescription Continuous    Liters per minute 2    Home Exercise Oxygen Prescription None    Home Resting Oxygen Prescription None    Compliance with Home Oxygen Use Yes      Goals/Expected Outcomes   Short Term Goals To learn and demonstrate proper pursed lip breathing techniques or other breathing techniques.     Long  Term Goals Exhibits proper breathing techniques, such as pursed lip breathing or other method taught during program session    Comments Noreta reports her shortness of breath has improved since starting the program. She practices PLB and finds that it helps, she has a pulse oximeter at home; reviewed what to look for and troubleshooting.    Goals/Expected Outcomes Short: practice PLB and diaphragmatic breathing at home. Long: Use PLB and diaphragmatic breathing independently post [employer].             Initial Exercise Prescription:  Initial Exercise Prescription - 07/27/22 1400       Date of Initial Exercise RX and Referring Provider   Date 07/27/22    Referring Provider [name] MD      Oxygen   Maintain Oxygen Saturation 88% or higher      Recumbant Bike   Level 2    RPM 50    Watts 30    Minutes 15    METs 3      NuStep   Level 3     SPM 80    Minutes 15    METs 3      REL-XR   Level 2    Speed 50    Minutes 15    METs 3      Track   Laps 27    Minutes 15    METs 2.47      Prescription Details   Frequency (times per week) 2    Duration Progress to 30 minutes of continuous aerobic without signs/symptoms of physical distress      Intensity   THRR 40-80% of Max Heartrate 104-138    Ratings of Perceived Exertion 11-13    Perceived Dyspnea 0-4      Progression   Progression Continue to progress workloads to maintain intensity without signs/symptoms of physical distress.      Resistance Training   Training Prescription Yes    Weight 3 lb    Reps 10-15             Perform Capillary Blood Glucose checks as needed.  Exercise Prescription Changes:   Exercise Prescription Changes     Row Name 07/27/22 1400 08/13/22 1200 09/08/22 1400 09/22/22 0800 09/22/22 0900     Response to Exercise   Blood Pressure (Admit) 126/70 118/70 126/84 142/90 --   Blood Pressure (Exercise) 184/74 132/80  130/86 152/88 --   Blood Pressure (Exit) 128/70 122/76 122/74 126/84 --   Heart Rate (Admit) 70 bpm 73 bpm 70 bpm 80 bpm --   Heart Rate (Exercise) 110 bpm 108 bpm 95 bpm 95 bpm --   Heart Rate (Exit) 77 bpm 73 bpm 81 bpm 82 bpm --   Oxygen Saturation (Admit) 94 % 90 % 97 % 97 % --   Oxygen Saturation (Exercise) 92 % 98 % 93 % 92 % --   Oxygen Saturation (Exit) 93 % 97 % 96 % 94 % --   Rating of Perceived Exertion (Exercise) 14 15 15 15  --   Perceived Dyspnea (Exercise) 2 2 2 2  --   Symptoms hip pain 6/10 none SOB SOB --   Comments walk test resutls third full day of exercise -- -- --   Duration -- Progress to 30 minutes of  aerobic without signs/symptoms of physical distress Continue with 30 min of aerobic exercise without signs/symptoms of physical distress. Continue with 30 min of aerobic exercise without signs/symptoms of physical distress. --   Intensity -- THRR unchanged THRR unchanged THRR unchanged --      Progression   Progression -- Continue to progress workloads to maintain intensity without signs/symptoms of physical distress. Continue to progress workloads to maintain intensity without signs/symptoms of physical distress. Continue to progress workloads to maintain intensity without signs/symptoms of physical distress. --   Average METs -- 2.74 2.8 2.96 --     Resistance Training   Training Prescription -- Yes Yes Yes --   Weight -- 3 lb 3 lb 3 lb --   Reps -- 10-15 10-15 10-15 --     Interval Training   Interval Training -- No No No --     Recumbant Bike   Level -- 2 2 1.5 --   Watts -- -- 31 31 --   Minutes -- 15 15 15  --   METs -- 2.3 2.1 2.2 --     NuStep   Level -- 3 3 4  --   Minutes -- 15 15 15  --   METs -- 2.9 -- 3 --     REL-XR   Level -- 2 1 2  --   Minutes -- 15 15 15  --   METs -- 3.1 3 3.6 --     Track   Laps -- 30 30 30  --   Minutes -- 15 15 15  --   METs -- 2.63 2.63 2.63 --     Home Exercise Plan   Plans to continue exercise at -- -- -- -- Home (comment)  walking   Frequency -- -- -- -- Add 2 additional days to program exercise sessions.   Initial Home Exercises Provided -- -- -- -- 09/22/22     Oxygen   Maintain Oxygen Saturation -- 88% or higher 88% or higher 88% or higher --    Row Name 10/06/22 1400 10/21/22 1300           Response to Exercise   Blood Pressure (Admit) 128/84 128/84      Blood Pressure (Exercise) 136/64 136/74      Blood Pressure (Exit) 132/72 120/70      Heart Rate (Admit) 70 bpm 75 bpm      Heart Rate (Exercise) 95 bpm 96 bpm      Heart Rate (Exit) 83 bpm 87 bpm      Oxygen Saturation (Admit) 96 % 97 %      Oxygen Saturation (Exercise) 94 %  91 %      Oxygen Saturation (Exit) 96 % 98 %      Rating of Perceived Exertion (Exercise) 16 16      Perceived Dyspnea (Exercise) 3 1      Symptoms SOB SOB      Duration Continue with 30 min of aerobic exercise without signs/symptoms of physical distress. Continue with 30 min of aerobic  exercise without signs/symptoms of physical distress.      Intensity THRR unchanged THRR unchanged        Progression   Progression Continue to progress workloads to maintain intensity without signs/symptoms of physical distress. Continue to progress workloads to maintain intensity without signs/symptoms of physical distress.      Average METs 3.05 2.93        Resistance Training   Training Prescription Yes Yes      Weight 3 lb 3 lb      Reps 10-15 10-15        Interval Training   Interval Training No No        Recumbant Bike   Level 2 2      Watts 31 31      Minutes 15 15      METs 3.23 3.26        NuStep   Level 4 2      Minutes 15 15      METs 2.2 2.5        REL-XR   Level 2 2      Minutes 15 15      METs 4.6 3.6        Track   Laps 30 30      Minutes 15 15      METs 2.63 2.63        Home Exercise Plan   Plans to continue exercise at Home (comment)  walking Home (comment)  walking      Frequency Add 2 additional days to program exercise sessions. Add 2 additional days to program exercise sessions.      Initial Home Exercises Provided 09/22/22 09/22/22        Oxygen   Maintain Oxygen Saturation 88% or higher 88% or higher               Exercise Comments:   Exercise Comments     Row Name 08/04/22 0953           Exercise Comments First full day of exercise!  Patient was oriented to gym and equipment including functions, settings, policies, and procedures.  Patient's individual exercise prescription and treatment plan were reviewed.  All starting workloads were established based on the results of the 6 minute walk test done at initial orientation visit.  The plan for exercise progression was also introduced and progression will be customized based on patient's performance and goals.                Exercise Goals and Review:   Exercise Goals     Row Name 07/27/22 1431             Exercise Goals   Increase Physical Activity Yes        Intervention Provide advice, education, support and counseling about physical activity/exercise needs.;Develop an individualized exercise prescription for aerobic and resistive training based on initial evaluation findings, risk stratification, comorbidities and participant's personal goals.       Expected Outcomes Long Term: Exercising regularly at least 3-5 days a week.;Short Term: Attend rehab  on a regular basis to increase amount of physical activity.;Long Term: Add in home exercise to make exercise part of routine and to increase amount of physical activity.       Increase Strength and Stamina Yes       Intervention Provide advice, education, support and counseling about physical activity/exercise needs.;Develop an individualized exercise prescription for aerobic and resistive training based on initial evaluation findings, risk stratification, comorbidities and participant's personal goals.       Expected Outcomes Short Term: Increase workloads from initial exercise prescription for resistance, speed, and METs.;Short Term: Perform resistance training exercises routinely during rehab and add in resistance training at home;Long Term: Improve cardiorespiratory fitness, muscular endurance and strength as measured by increased METs and functional capacity (6MWT)       Able to understand and use rate of perceived exertion (RPE) scale Yes       Intervention Provide education and explanation on how to use RPE scale       Expected Outcomes Short Term: Able to use RPE daily in rehab to express subjective intensity level;Long Term:  Able to use RPE to guide intensity level when exercising independently       Able to understand and use Dyspnea scale Yes       Intervention Provide education and explanation on how to use Dyspnea scale       Expected Outcomes Short Term: Able to use Dyspnea scale daily in rehab to express subjective sense of shortness of breath during exertion;Long Term: Able to use Dyspnea scale to  guide intensity level when exercising independently       Knowledge and understanding of Target Heart Rate Range (THRR) Yes       Intervention Provide education and explanation of THRR including how the numbers were predicted and where they are located for reference       Expected Outcomes Short Term: Able to state/look up THRR;Long Term: Able to use THRR to govern intensity when exercising independently;Short Term: Able to use daily as guideline for intensity in rehab       Able to check pulse independently Yes       Intervention Provide education and demonstration on how to check pulse in carotid and radial arteries.;Review the importance of being able to check your own pulse for safety during independent exercise       Expected Outcomes Short Term: Able to explain why pulse checking is important during independent exercise;Long Term: Able to check pulse independently and accurately       Understanding of Exercise Prescription Yes       Intervention Provide education, explanation, and written materials on patient's individual exercise prescription       Expected Outcomes Short Term: Able to explain program exercise prescription;Long Term: Able to explain home exercise prescription to exercise independently                Exercise Goals Re-Evaluation :  Exercise Goals Re-Evaluation     Row Name 08/04/22 0956 08/13/22 1245 08/25/22 0824 09/01/22 0925 09/08/22 1417     Exercise Goal Re-Evaluation   Exercise Goals Review Able to understand and use rate of perceived exertion (RPE) scale;Able to understand and use Dyspnea scale;Knowledge and understanding of Target Heart Rate Range (THRR);Understanding of Exercise Prescription Understanding of Exercise Prescription;Increase Strength and Stamina;Increase Physical Activity Understanding of Exercise Prescription;Increase Strength and Stamina;Increase Physical Activity Understanding of Exercise Prescription;Increase Strength and Stamina;Increase  Physical Activity Understanding of Exercise Prescription;Increase Strength and Stamina;Increase Physical Activity  Comments Reviewed RPE  and dyspnea scale, THR and program prescription with pt today.  Pt voiced understanding and was given a copy of goals to take home. Izzah is off to a good start in rehab.  She has completed her first three full days of exercise thus far.  She is already up to 30 laps!!  We will continue to monitor her progress. Nhung has not attended rehab since the last review. We will contact pt and remind her of the importance of good attendance in the program. We will continue to monitor when she returns. Kasiyah feels that she is making good progress in the program. She states that getting sick did set her back a little bit, but she is excited about getting back to her exercise routine. She states that the main benefits she has seen have been improved strength, she has lost some weight, and she feels better overall. She also states that she has been walking at home on her days away from rehab for 45 minutes. She reports walking at home about three days a week. Kitt is doing well in rehab as she has returned from being out a bit. As she is getting back into the routine, she was able to walk a full 30 laps around the track and increased her watts on the recumbent bike to 31 watts. Her XR stayed at a level 1 and we will encourage her to inrease it back up. Will continue to monitor.   Expected Outcomes Short: Use RPE daily to regulate intensity. Long: Follow program prescription in THR. Short: Continue to attend rehab regularly Long: Continue to follow program prescription Short: Return to regular attendance in the program. Long: Continue to follow program prescription. Short: Continue to walk on days away from rehab. Long: Continue to improve strength and stamina. Short: Increase level to 2 on the XR Long: Continue to increase overall stamin and MET level    Row Name 09/22/22 0850 09/22/22  0933 10/06/22 1435 10/13/22 0958 10/21/22 1404     Exercise Goal Re-Evaluation   Exercise Goals Review Understanding of Exercise Prescription;Increase Strength and Stamina;Increase Physical Activity Understanding of Exercise Prescription;Increase Strength and Stamina;Increase Physical Activity;Knowledge and understanding of Target Heart Rate Range (THRR);Able to understand and use rate of perceived exertion (RPE) scale;Able to understand and use Dyspnea scale;Able to check pulse independently Increase Physical Activity;Increase Strength and Stamina;Understanding of Exercise Prescription Increase Physical Activity;Increase Strength and Stamina;Understanding of Exercise Prescription Increase Physical Activity;Increase Strength and Stamina;Understanding of Exercise Prescription   Comments Jumana is doing well in rehab. She recently increased her overall average MET level to 2.96 METs. She also improved up to level 4 on the T4 Nustep and back up to level 2 on the XR. She has conistently walked 30 laps on the track as well, and could push for more. We will continue to monitor her progress in the program. Reviewed home exercise with pt today.  Pt plans to walk at home for exercise.  Reviewed THR, pulse, RPE, sign and symptoms, pulse oximetery and when to call 911 or MD.  Also discussed weather considerations and indoor options.  Pt voiced understanding. Nazariah continues to do well in rehab. She has stayed pretty consistent with her workloads on the seated machines such as the T4 Nustep, XR, and Biostep. She also hits consistently walking 30 laps on track and not quite hitting her THR. Staff will encourage patient to start increasing her laps and workloads. Will continue to monitor. Jiah reports walking at  home when not at rehab - for about 20 minutes (RPE 13)(shortness of breath is only slight) 3x/week. Encouraged to continue walking at home. Braedyn continues to do well in the program. She has stayed consistent with her  workloads on seated machines. She has also continued to walk 30 laps on the track, however, we will encourage her to continue to push for more. She has done well with 3 lb hand weights for resistance training as well. We will continue to monitor her progress in the program.   Expected Outcomes Short: Continue to push for more laps on the track. Long: Continue to improve strength and stamina. Short: Start to add in more walking at home Long: Continue to improve stamina Short: Increase laps on track beyond 30, increase to level 3 on the XR Long: Continue to increase overall MET level and stamina -- Short: Continue to push for more laps beyond 30. Long: Continue to improve strength and stamina.            Discharge Exercise Prescription (Final Exercise Prescription Changes):  Exercise Prescription Changes - 10/21/22 1300       Response to Exercise   Blood Pressure (Admit) 128/84    Blood Pressure (Exercise) 136/74    Blood Pressure (Exit) 120/70    Heart Rate (Admit) 75 bpm    Heart Rate (Exercise) 96 bpm    Heart Rate (Exit) 87 bpm    Oxygen Saturation (Admit) 97 %    Oxygen Saturation (Exercise) 91 %    Oxygen Saturation (Exit) 98 %    Rating of Perceived Exertion (Exercise) 16    Perceived Dyspnea (Exercise) 1    Symptoms SOB    Duration Continue with 30 min of aerobic exercise without signs/symptoms of physical distress.    Intensity THRR unchanged      Progression   Progression Continue to progress workloads to maintain intensity without signs/symptoms of physical distress.    Average METs 2.93      Resistance Training   Training Prescription Yes    Weight 3 lb    Reps 10-15      Interval Training   Interval Training No      Recumbant Bike   Level 2    Watts 31    Minutes 15    METs 3.26      NuStep   Level 2    Minutes 15    METs 2.5      REL-XR   Level 2    Minutes 15    METs 3.6      Track   Laps 30    Minutes 15    METs 2.63      Home Exercise Plan    Plans to continue exercise at Home (comment)   walking   Frequency Add 2 additional days to program exercise sessions.    Initial Home Exercises Provided 09/22/22      Oxygen   Maintain Oxygen Saturation 88% or higher             Nutrition:  Target Goals: Understanding of nutrition guidelines, daily intake of sodium 1500mg , cholesterol 200mg , calories 30% from fat and 7% or less from saturated fats, daily to have 5 or more servings of fruits and vegetables.  Education: All About Nutrition: -Group instruction provided by verbal, written material, interactive activities, discussions, models, and posters to present general guidelines for heart healthy nutrition including fat, fiber, MyPlate, the role of sodium in heart healthy nutrition, utilization of  the nutrition label, and utilization of this knowledge for meal planning. Follow up email sent as well. Written material given at graduation.   Biometrics:  Pre Biometrics - 07/27/22 1431       Pre Biometrics   Height 5\' 7"  (1.702 m)    Weight 175 lb 14.4 oz (79.8 kg)    Waist Circumference 40.5 inches    Hip Circumference 41 inches    Waist to Hip Ratio 0.99 %    BMI (Calculated) 27.54    Single Leg Stand 6.7 seconds              Nutrition Therapy Plan and Nutrition Goals:  Nutrition Therapy & Goals - 07/27/22 0938       Nutrition Therapy   Diet Heart healthy, low Na, pulmonary MNT    Drug/Food Interactions Statins/Certain Fruits    Protein (specify units) 85-95g    Fiber 25 grams    Whole Grain Foods 3 servings    Saturated Fats 12 max. grams    Fruits and Vegetables 8 servings/day    Sodium 2 grams      Personal Nutrition Goals   Nutrition Goal ST: review paperwork, try eating small melas/snacks, try ensure complete LT: meet protein and energy needs, eat a variety of whole foods    Comments 68 y.o. F admitted to pulmonary rehab for COPD. PMHx includes HTN, HLD, GERD, former tobacco use, former alcohol abuse,  macrocytic anemia. Relevant medications includes lipitor, buspirone, zetia, mobic, remeron, MVI with minerals, omeprazole, oxycodone, K+, breztri. Monseratt reports meeting with outpatient RD twice and feels that since her diet is limited as she reports being a "pickey eater" in addition to a reduced appetite. Reviewed what outpatient RD suggested as well as general heart healthy eating and pulmonary MNT. She does not eat until the afternoon/evening at a 3-5pm meal when she gets hungry: pork chop and some macaroni and cheese. She does not like most vegetables, but she enjoys salads (onions and cucumbers and cheese and some egg, pineapple, romaine and iceburg lettuce) and potatoes. She reports not eating a large meal at this time. Ronniesha reports that she has not tried the reommendations given by outpatient RD as she does not like eating when she is not hunrgy, but is open to trying the premier protein - suggested since she is not eating other meals/snacks at this time she could have ensure complete which would provide the same amount of protein for 350 calories which is closer to a meal replacement. Encouraged that if she begins to eat more meals/snacks that premier protein would be a good option. Encouraged mechanical eating while she is still not feeling hunger cues and suggested snacks with fiber, protein, and fat such as fruit with yogurt or peanut butter. Suggested that her appetite could be lower due to COPD, anemia, and/or prolonged time eating a smaller amount. Maizy also reports her iron is still low and does not know what her MD suggested - encouraged her to reach out to her MD to see what they recommend.      Intervention Plan   Intervention Prescribe, educate and counsel regarding individualized specific dietary modifications aiming towards targeted core components such as weight, hypertension, lipid management, diabetes, heart failure and other comorbidities.;Nutrition handout(s) given to patient.     Expected Outcomes Short Term Goal: Understand basic principles of dietary content, such as calories, fat, sodium, cholesterol and nutrients.;Short Term Goal: A plan has been developed with personal nutrition goals set during dietitian appointment.;Long  Term Goal: Adherence to prescribed nutrition plan.             Nutrition Assessments:  MEDIFICTS Score Key: ?70 Need to make dietary changes  40-70 Heart Healthy Diet ? 40 Therapeutic Level Cholesterol Diet  Flowsheet Row Pulmonary Rehab from 07/27/2022 in [facility]  Picture Your Plate Total Score on Admission 40      Picture Your Plate Scores: [id number] Unhealthy dietary pattern with much room for improvement. 41-50 Dietary pattern unlikely to meet recommendations for good health and room for improvement. 51-60 More healthful dietary pattern, with some room for improvement.  >60 Healthy dietary pattern, although there may be some specific behaviors that could be improved.   Nutrition Goals Re-Evaluation:  Nutrition Goals Re-Evaluation     [place] Name 09/01/22 0936 10/13/22 0953           Goals   Current Weight 172 lb 14.4 oz (78.4 kg) --      Nutrition Goal Talynn reports that she has continued to work on her portion control by trying to eat smaller meals and snacks. She has also been trying to eat a varietyof whole foods. We will continue to monitor her dietary habits. ST: eat small/frequent meals/snacks that include fiber, fat, and protein, drink ensure daily LT: meet protein and energy needs, eat a variety of whole foods      Comment Short: Continue to try eating smaller meals/snacks. Long: Continue to practice nutritional habits discussed with RD. Davonda reports doing well with her nutrition, she is more mindful of her food choices, managing her portions, and limiting snacking. Deshanta reports her appetite is still low; likely due in part to COPD as welll as limiting calorie intake for a longer period of time. She  reports snacking on pretzels and only having dinner as a meal where she has "a meat and a vegetable", sometimes she will have an ensure in the morning - she reports this is due to appetite, but also a desire to lose weight. Reviewed importance of meeting energy and protein needs and that weight loss due to not meeting these needs is more likely muscle loss. Encouraged small/frequent meals/snacks to avoid getting too full at one time - encouraged to include fiber, fat, and protein at these times: apple and pretzels with peanut butter, 1/2 sandwich with leftover chicken and avocado, pita and veggies with hummus, greek yogurt with nuts and berries, etc.      Expected Outcome -- ST: eat small/frequent meals/snacks that include fiber, fat, and protein, drink ensure daily LT: meet protein and energy needs, eat a variety of whole foods               Nutrition Goals Discharge (Final Nutrition Goals Re-Evaluation):  Nutrition Goals Re-Evaluation - 10/13/22 0953       Goals   Nutrition Goal ST: eat small/frequent meals/snacks that include fiber, fat, and protein, drink ensure daily LT: meet protein and energy needs, eat a variety of whole foods    Comment Genelle reports doing well with her nutrition, she is more mindful of her food choices, managing her portions, and limiting snacking. Dayanis reports her appetite is still low; likely due in part to COPD as welll as limiting calorie intake for a longer period of time. She reports snacking on pretzels and only having dinner as a meal where she has "a meat and a vegetable", sometimes she will have an ensure in the morning - she reports this is due to  appetite, but also a desire to lose weight. Reviewed importance of meeting energy and protein needs and that weight loss due to not meeting these needs is more likely muscle loss. Encouraged small/frequent meals/snacks to avoid getting too full at one time - encouraged to include fiber, fat, and protein at these times:  apple and pretzels with peanut butter, 1/2 sandwich with leftover chicken and avocado, pita and veggies with hummus, greek yogurt with nuts and berries, etc.    Expected Outcome ST: eat small/frequent meals/snacks that include fiber, fat, and protein, drink ensure daily LT: meet protein and energy needs, eat a variety of whole foods             Psychosocial: Target Goals: Acknowledge presence or absence of significant depression and/or stress, maximize coping skills, provide positive support system. Participant is able to verbalize types and ability to use techniques and skills needed for reducing stress and depression.   Education: Stress, Anxiety, and Depression - Group verbal and visual presentation to define topics covered.  Reviews how body is impacted by stress, anxiety, and depression.  Also discusses healthy ways to reduce stress and to treat/manage anxiety and depression.  Written material given at graduation. Flowsheet Row Pulmonary Rehab from 10/29/2022 in [facility]  Date 10/29/22  Educator [facility]  Instruction Review Code 1- [employer] Understanding       Education: Sleep Hygiene -Provides group verbal and written instruction about how sleep can affect your health.  Define sleep hygiene, discuss sleep cycles and impact of sleep habits. Review good sleep hygiene tips.    Initial Review & Psychosocial Screening:  Initial Psych Review & Screening - 07/16/22 0912       Initial Review   Current issues with Current Depression;History of Depression;Current Psychotropic Meds;Current Sleep Concerns;Current Stress Concerns    Source of Stress Concerns Chronic Illness    Comments She has always had some form of depression and takes Buspar for it. Kimberly feels  depressed at times but states it does not necessarily stem from something.      Family Dynamics   Good Support System? Yes    Comments She can look to her two sisters for support that live in town.       Barriers   Psychosocial barriers to participate in program The patient should benefit from training in stress management and relaxation.      Screening Interventions   Interventions To provide support and resources with identified psychosocial needs;Encouraged to exercise;Provide feedback about the scores to participant    Expected Outcomes Short Term goal: Utilizing psychosocial counselor, staff and physician to assist with identification of specific Stressors or current issues interfering with healing process. Setting desired goal for each stressor or current issue identified.;Long Term Goal: Stressors or current issues are controlled or eliminated.;Short Term goal: Identification and review with participant of any Quality of Life or Depression concerns found by scoring the questionnaire.;Long Term goal: The participant improves quality of Life and PHQ9 Scores as seen by post scores and/or verbalization of changes             Quality of Life Scores:  Scores of 19 and below usually indicate a poorer quality of life in these areas.  A difference of  2-3 points is a clinically meaningful difference.  A difference of 2-3 points in the total score of the Quality of Life Index has been associated with significant improvement in overall quality of life, self-image, physical symptoms, and general health  in studies assessing change in quality of life.  PHQ-9: Review Flowsheet  More data exists      08/13/2022 07/27/2022 05/13/2022 04/22/2022 04/21/2022  Depression screen PHQ 2/9  Decreased Interest  0  0 0  Down, Depressed, Hopeless  0  0 0  PHQ - 2 Score  0  0 0  Altered sleeping - 0  - 1  Tired, decreased energy - 0  - 1  Change in appetite - 0  - 0  Feeling bad or failure about yourself  - 0  - 0  Trouble concentrating - 0  - 0  Moving slowly or fidgety/restless - 0  - 0  Suicidal thoughts - 0  - 0  PHQ-9 Score - 0  - 2  Difficult doing work/chores - Not difficult at all  - Not difficult at  all    Details       Information is confidential and restricted. Go to Review Flowsheets to unlock data.        Interpretation of Total Score  Total Score Depression Severity:  1-4 = Minimal depression, 5-9 = Mild depression, 10-14 = Moderate depression, 15-19 = Moderately severe depression, 20-27 = Severe depression   Psychosocial Evaluation and Intervention:  Psychosocial Evaluation - 07/16/22 0917       Psychosocial Evaluation & Interventions   Interventions Stress management education;Relaxation education;Encouraged to exercise with the program and follow exercise prescription    Comments She has always had some form of depression and takes Buspar for it. Ritta feels depressed at times but states it does not necessarily stem from something.She can look to her two sisters for support that live in town.    Expected Outcomes Short: Start [facility] to help with mood. Long: Maintain a healthy mental state.    Continue Psychosocial Services  Follow up required by staff             Psychosocial Re-Evaluation:  Psychosocial Re-Evaluation     [place] Name 09/01/22 [phone] 09/22/22 0956 10/13/22 0957         Psychosocial Re-Evaluation   Current issues with Current Depression;Current Sleep Concerns None Identified None Identified     Comments Logann is still taking Buspar for anxiety and depression. She states that her depressive symptoms have improved since starting the program. She states that for stress relief she likes to play game on her phone, cook, and exercise. She states that she does have a good support system at home made up by her husband and two sisters. She reports that she was having some sleep concerns, but her sleep has improved since starting the program. Patient reports no issues with their current mental states, sleep, stress, depression or anxiety. Will follow up with patient in a few weeks for any changes. Chevonne reports that she has no issues with sleep or anthing that  is providing stress; she also denies anxiety and depression.     Expected Outcomes Short: Continue to attend rehab for stress relief. Long: Maintain positive outlook. Short: Continue to exercise regularly to support mental health and notify staff of any changes. Long: maintain mental health and well being through teaching of rehab or prescribed medications independently. Short: Continue to exercise regularly to support mental health and notify staff of any changes. Long: maintain mental health and well being through teaching of rehab or prescribed medications independently.     Interventions Encouraged to attend Pulmonary Rehabilitation for the exercise Encouraged to attend Pulmonary Rehabilitation for the exercise Encouraged to  attend Pulmonary Rehabilitation for the exercise     Continue Psychosocial Services  Follow up required by staff Follow up required by staff Follow up required by staff              Psychosocial Discharge (Final Psychosocial Re-Evaluation):  Psychosocial Re-Evaluation - 10/13/22 0957       Psychosocial Re-Evaluation   Current issues with None Identified    Comments Stefhanie reports that she has no issues with sleep or anthing that is providing stress; she also denies anxiety and depression.    Expected Outcomes Short: Continue to exercise regularly to support mental health and notify staff of any changes. Long: maintain mental health and well being through teaching of rehab or prescribed medications independently.    Interventions Encouraged to attend Pulmonary Rehabilitation for the exercise    Continue Psychosocial Services  Follow up required by staff             Education: Education Goals: Education classes will be provided on a weekly basis, covering required topics. Participant will state understanding/return demonstration of topics presented.  Learning Barriers/Preferences:  Learning Barriers/Preferences - 07/16/22 0911       Learning  Barriers/Preferences   Learning Barriers None    Learning Preferences None             General Pulmonary Education Topics:  Infection Prevention: - Provides verbal and written material to individual with discussion of infection control including proper hand washing and proper equipment cleaning during exercise session. Flowsheet Row Pulmonary Rehab from 10/29/2022 in [facility] Cardiac and Pulmonary Rehab  Date 07/16/22  Educator [facility]  Instruction Review Code 1- Verbalizes Understanding       Falls Prevention: - Provides verbal and written material to individual with discussion of falls prevention and safety. Flowsheet Row Pulmonary Rehab from 10/29/2022 in [facility] Cardiac and Pulmonary Rehab  Date 07/16/22  Educator [facility]  Instruction Review Code 1- Verbalizes Understanding       Chronic Lung Disease Review: - Group verbal instruction with posters, models, PowerPoint presentations and videos,  to review new updates, new respiratory medications, new advancements in procedures and treatments. Providing information on websites and "800" numbers for continued self-education. Includes information about supplement oxygen, available portable oxygen systems, continuous and intermittent flow rates, oxygen safety, concentrators, and Medicare reimbursement for oxygen. Explanation of Pulmonary Drugs, including class, frequency, complications, importance of spacers, rinsing mouth after steroid MDI's, and proper cleaning methods for nebulizers. Review of basic lung anatomy and physiology related to function, structure, and complications of lung disease. Review of risk factors. Discussion about methods for diagnosing sleep apnea and types of masks and machines for OSA. Includes a review of the use of types of environmental controls: home humidity, furnaces, filters, dust mite/pet prevention, HEPA vacuums. Discussion about weather changes, air quality and the benefits of nasal washing. Instruction on Warning  signs, infection symptoms, calling MD promptly, preventive modes, and value of vaccinations. Review of effective airway clearance, coughing and/or vibration techniques. Emphasizing that all should Create an Action Plan. Written material given at graduation. Flowsheet Row Pulmonary Rehab from 10/29/2022 in [facility] Cardiac and Pulmonary Rehab  Education need identified 07/27/22  Date 10/22/22  Educator [facility]  Instruction Review Code 1- Verbalizes Understanding       AED/CPR: - Group verbal and written instruction with the use of models to demonstrate the basic use of the AED with the basic ABC's of resuscitation.    Anatomy and Cardiac Procedures: - Group verbal and visual  presentation and models provide information about basic cardiac anatomy and function. Reviews the testing methods done to diagnose heart disease and the outcomes of the test results. Describes the treatment choices: Medical Management, Angioplasty, or Coronary Bypass Surgery for treating various heart conditions including Myocardial Infarction, Angina, Valve Disease, and Cardiac Arrhythmias.  Written material given at graduation. Flowsheet Row Pulmonary Rehab from 10/29/2022 in [facility] Cardiac and Pulmonary Rehab  Date 09/24/22  Educator SB  Instruction Review Code 1- Verbalizes Understanding       Medication Safety: - Group verbal and visual instruction to review commonly prescribed medications for heart and lung disease. Reviews the medication, class of the drug, and side effects. Includes the steps to properly store meds and maintain the prescription regimen.  Written material given at graduation. Flowsheet Row Pulmonary Rehab from 10/29/2022 in [facility] Cardiac and Pulmonary Rehab  Date 10/08/22  Educator [facility]  Instruction Review Code 1- Verbalizes Understanding       Other: -Provides group and verbal instruction on various topics (see comments) Flowsheet Row Pulmonary Rehab from 10/29/2022 in [facility] Cardiac and Pulmonary Rehab   Date 10/01/22  Educator SB  Instruction Review Code 1- Verbalizes Understanding       Knowledge Questionnaire Score:  Knowledge Questionnaire Score - 07/27/22 1432       Knowledge Questionnaire Score   Pre Score 14/18              Core Components/Risk Factors/Patient Goals at Admission:  Personal Goals and Risk Factors at Admission - 07/27/22 1433       Core Components/Risk Factors/Patient Goals on Admission    Weight Management Yes;Weight Loss    Intervention Weight Management: Develop a combined nutrition and exercise program designed to reach desired caloric intake, while maintaining appropriate intake of nutrient and fiber, sodium and fats, and appropriate energy expenditure required for the weight goal.;Weight Management: Provide education and appropriate resources to help participant work on and attain dietary goals.;Weight Management/Obesity: Establish reasonable short term and long term weight goals.    Admit Weight 175 lb 14.4 oz (79.8 kg)    Goal Weight: Short Term 170 lb (77.1 kg)    Goal Weight: Long Term 165 lb (74.8 kg)    Expected Outcomes Long Term: Adherence to nutrition and physical activity/exercise program aimed toward attainment of established weight goal;Short Term: Continue to assess and modify interventions until short term weight is achieved;Weight Loss: Understanding of general recommendations for a balanced deficit meal plan, which promotes 1-2 lb weight loss per week and includes a negative energy balance of [phone] kcal/d;Understanding of distribution of calorie intake throughout the day with the consumption of 4-5 meals/snacks;Understanding recommendations for meals to include 15-35% energy as protein, 25-35% energy from fat, 35-60% energy from carbohydrates, less than 200mg  of dietary cholesterol, 20-35 gm of total fiber daily    Improve shortness of breath with ADL's Yes    Intervention Provide education, individualized exercise plan and daily  activity instruction to help decrease symptoms of SOB with activities of daily living.    Expected Outcomes Short Term: Improve cardiorespiratory fitness to achieve a reduction of symptoms when performing ADLs;Long Term: Be able to perform more ADLs without symptoms or delay the onset of symptoms    Increase knowledge of respiratory medications and ability to use respiratory devices properly  Yes    Intervention Provide education and demonstration as needed of appropriate use of medications, inhalers, and oxygen therapy.    Expected Outcomes Short Term: Achieves understanding of medications  use. Understands that oxygen is a medication prescribed by physician. Demonstrates appropriate use of inhaler and oxygen therapy.;Long Term: Maintain appropriate use of medications, inhalers, and oxygen therapy.    Hypertension Yes    Intervention Provide education on lifestyle modifcations including regular physical activity/exercise, weight management, moderate sodium restriction and increased consumption of fresh fruit, vegetables, and low fat dairy, alcohol moderation, and smoking cessation.;Monitor prescription use compliance.    Expected Outcomes Short Term: Continued assessment and intervention until BP is < 140/11mm HG in hypertensive participants. < 130/61mm HG in hypertensive participants with diabetes, heart failure or chronic kidney disease.;Long Term: Maintenance of blood pressure at goal levels.    Lipids Yes    Intervention Provide education and support for participant on nutrition & aerobic/resistive exercise along with prescribed medications to achieve LDL 70mg , HDL >40mg .    Expected Outcomes Short Term: Participant states understanding of desired cholesterol values and is compliant with medications prescribed. Participant is following exercise prescription and nutrition guidelines.;Long Term: Cholesterol controlled with medications as prescribed, with individualized exercise RX and with personalized  nutrition plan. Value goals: LDL < 70mg , HDL > 40 mg.             Education:Diabetes - Individual verbal and written instruction to review signs/symptoms of diabetes, desired ranges of glucose level fasting, after meals and with exercise. Acknowledge that pre and post exercise glucose checks will be done for 3 sessions at entry of program.   Know Your Numbers and Heart Failure: - Group verbal and visual instruction to discuss disease risk factors for cardiac and pulmonary disease and treatment options.  Reviews associated critical values for Overweight/Obesity, Hypertension, Cholesterol, and Diabetes.  Discusses basics of heart failure: signs/symptoms and treatments.  Introduces Heart Failure Zone chart for action plan for heart failure.  Written material given at graduation. Flowsheet Row Pulmonary Rehab from 10/29/2022 in [facility] Cardiac and Pulmonary Rehab  Date 08/06/22  Educator SB  Instruction Review Code 1- Verbalizes Understanding       Core Components/Risk Factors/Patient Goals Review:   Goals and Risk Factor Review     Row Name 09/01/22 [phone] 09/22/22 0955 10/13/22 0948         Core Components/Risk Factors/Patient Goals Review   Personal Goals Review Weight Management/Obesity;Hypertension Improve shortness of breath with ADL's Improve shortness of breath with ADL's;Hypertension     Review Soledad reports that she has lost some weight as she is down from 175 lb to 172.9 lb. She is still working toward her long term weight goal of 165 lb. She has been checking her BP at home and reports that they have been around 130/78. She was encouraged to continue to check her BP at home. Spoke to patient about their shortness of breath and what they can do to improve. Patient has been informed of breathing techniques when starting the program. Patient is informed to tell staff if they have had any med changes and that certain meds they are taking or not taking can be causing shortness of breath.  Carlisa reports that her shortness of breath has been improving since starting at rehab. Anner reports not checking her BP at home unless she has issues, but has a cuff at home. Encouraged to check BP at home at least 2x/week to get into the habit of checking at home. Today in rehab her resting BP was 132/80.     Expected Outcomes Short: Continue to check BP at home. Long: Continue to work toward long term weight goal.  Short: Attend [facility] regularly to improve shortness of breath with ADL's. Long: maintain independence with ADL's Short: Attend [facility] regularly to improve shortness of breath with ADL's. Long: maintain independence with ADL's              Core Components/Risk Factors/Patient Goals at Discharge (Final Review):   Goals and Risk Factor Review - 10/13/22 0948       Core Components/Risk Factors/Patient Goals Review   Personal Goals Review Improve shortness of breath with ADL's;Hypertension    Review Nautia reports that her shortness of breath has been improving since starting at rehab. Theta reports not checking her BP at home unless she has issues, but has a cuff at home. Encouraged to check BP at home at least 2x/week to get into the habit of checking at home. Today in rehab her resting BP was 132/80.    Expected Outcomes Short: Attend [facility] regularly to improve shortness of breath with ADL's. Long: maintain independence with ADL's             ITP Comments:  ITP Comments     Row Name 07/16/22 0909 07/27/22 1425 08/04/22 0953 08/12/22 0956 09/09/22 0904   ITP Comments Virtual Visit completed. Patient informed on EP and RD appointment and 6 Minute walk test. Patient also informed of patient health questionnaires on My Chart. Patient Verbalizes understanding. Visit diagnosis can be found in [facility] 07/07/2022. Completed 6MWT and gym orientation. Initial ITP created and sent for review to Dr. Zetta Bills, Medical Director. First full day of exercise!  Patient was oriented to  gym and equipment including functions, settings, policies, and procedures.  Patient's individual exercise prescription and treatment plan were reviewed.  All starting workloads were established based on the results of the 6 minute walk test done at initial orientation visit.  The plan for exercise progression was also introduced and progression will be customized based on patient's performance and goals. 30 Day review completed. Medical Director ITP review done, changes made as directed, and signed approval by Medical Director.    new to program 30 Day review completed. Medical Director ITP review done, changes made as directed, and signed approval by Medical Director.    Numidia Name 10/07/22 1212 11/04/22 0802         ITP Comments 30 day review completed. ITP sent to Dr. Zetta Bills, Medical Director of  Pulmonary Rehab. Continue with ITP unless changes are made by physician. 30 Day review completed. Medical Director ITP review done, changes made as directed, and signed approval by Medical Director.               Comments:

## 2022-11-05 ENCOUNTER — Encounter: Payer: Self-pay | Admitting: Podiatry

## 2022-11-05 ENCOUNTER — Ambulatory Visit: Payer: Medicare Other | Admitting: Podiatry

## 2022-11-05 ENCOUNTER — Encounter: Payer: Medicare Other | Admitting: *Deleted

## 2022-11-05 VITALS — BP 144/75 | HR 82

## 2022-11-05 DIAGNOSIS — B351 Tinea unguium: Secondary | ICD-10-CM | POA: Diagnosis not present

## 2022-11-05 DIAGNOSIS — M2041 Other hammer toe(s) (acquired), right foot: Secondary | ICD-10-CM

## 2022-11-05 DIAGNOSIS — M79675 Pain in left toe(s): Secondary | ICD-10-CM

## 2022-11-05 DIAGNOSIS — G629 Polyneuropathy, unspecified: Secondary | ICD-10-CM | POA: Diagnosis not present

## 2022-11-05 DIAGNOSIS — J449 Chronic obstructive pulmonary disease, unspecified: Secondary | ICD-10-CM

## 2022-11-05 DIAGNOSIS — M79674 Pain in right toe(s): Secondary | ICD-10-CM

## 2022-11-05 NOTE — Progress Notes (Signed)
This patient presents to the office with chief complaint of long thick painful nails.  Patient says the nails are painful walking and wearing shoes.  This patient is unable to self treat.  This patient is unable to trim her nails since she is unable to reach her nails.   She presents to the office for preventative foot care services.  General Appearance  Alert, conversant and in no acute stress.  Vascular  Dorsalis pedis and posterior tibial  pulses are palpable  bilaterally.  Capillary return is within normal limits  bilaterally. Temperature is within normal limits  bilaterally.  Neurologic  Senn-Weinstein monofilament wire test within normal limits  bilaterally. Muscle power within normal limits bilaterally.  Nails Thick disfigured discolored nails with subungual debris  from hallux to fifth toes bilaterally. No evidence of bacterial infection or drainage bilaterally.  Orthopedic  No limitations of motion  feet .  No crepitus or effusions noted.  HAV  B/L.  Hammer toes 2-5  B/L.  Skin  normotropic skin with no porokeratosis noted bilaterally.  No signs of infections or ulcers noted.    Onychomycosis  Nails  B/L.  Pain in right toes  Pain in left toes    Debridement of nails both feet with nail nipper  followed trimming the nails with dremel tool.   RTC 3 months.   Audyn Dimercurio DPM   

## 2022-11-05 NOTE — Progress Notes (Signed)
Daily Session Note  Patient Details  Name: Gabrielle Pineda MRN: [id number] Date of Birth: 03/26/1956 Referring Provider:   Flowsheet Row Pulmonary Rehab from 07/27/2022 in [facility] Cardiac and Pulmonary Rehab  Referring Provider [name] MD       Encounter Date: 11/05/2022  Check In:  Session Check In - 11/05/22 0931       Check-In   Supervising physician immediately available to respond to emergencies See telemetry face sheet for immediately available ER MD    Location [facility]-Cardiac & Pulmonary Rehab    Staff Present Larna Daughters, MS, ACSM CEP, Exercise Physiologist;Joseph Golden, RCP,RRT,BSRT;Jun Rightmyer Frederico Hamman RN, Odelia Gage, RN, ADN    Virtual Visit No    Medication changes reported     No    Fall or balance concerns reported    No    Tobacco Cessation No Change    Warm-up and Cool-down Performed on first and last piece of equipment    Resistance Training Performed Yes    VAD Patient? No    PAD/SET Patient? No      Pain Assessment   Currently in Pain? No/denies                Social History   Tobacco Use  Smoking Status Former   Packs/day: 2.00   Years: 40.00   Additional pack years: 0.00   Total pack years: 80.00   Types: Cigarettes   Quit date: 10/30/2020   Years since quitting: 2.0  Smokeless Tobacco Never    Goals Met:  Independence with exercise equipment Exercise tolerated well No report of concerns or symptoms today Strength training completed today  Goals Unmet:  Not Applicable  Comments: Pt able to follow exercise prescription today without complaint.  Will continue to monitor for progression.    Dr. Emily Filbert is Medical Director for [place].  Dr. Ottie Glazier is Medical Director for [facility] Pulmonary Rehabilitation.

## 2022-11-09 ENCOUNTER — Ambulatory Visit (INDEPENDENT_AMBULATORY_CARE_PROVIDER_SITE_OTHER): Payer: Medicare Other

## 2022-11-09 VITALS — Ht 69.0 in | Wt 170.0 lb

## 2022-11-09 DIAGNOSIS — Z0000 Encounter for general adult medical examination without abnormal findings: Secondary | ICD-10-CM

## 2022-11-09 NOTE — Progress Notes (Signed)
I connected with  MCKINLIE SWEEZER on 11/09/22 by a audio enabled telemedicine application and verified that I am speaking with the correct person using two identifiers.  Patient Location: Home  Provider Location: Office/Clinic  I discussed the limitations of evaluation and management by telemedicine. The patient expressed understanding and agreed to proceed.  Subjective:   Gabrielle Pineda is a 67 y.o. female who presents for Medicare Annual (Subsequent) preventive examination.  Review of Systems    Cardiac Risk Factors include: advanced age (>70men, >9 women);dyslipidemia;hypertension    Objective:    Today's Vitals   11/09/22 1449 11/09/22 1450  Weight: 170 lb (77.1 kg)   Height: 5\' 9"  (1.753 m)   PainSc:  6    Body mass index is 25.1 kg/m.     11/09/2022    3:01 PM 10/06/2022    8:22 AM 07/16/2022    9:07 AM 06/11/2022    2:13 PM 04/22/2022    9:11 AM 04/07/2022    8:30 AM 04/06/2022    2:41 PM  Advanced Directives  Does Patient Have a Medical Advance Directive? No No No No No No No  Would patient like information on creating a medical advance directive?  No - Patient declined No - Patient declined No - Patient declined No - Patient declined No - Patient declined     Current Medications (verified) Outpatient Encounter Medications as of 11/09/2022  Medication Sig   acyclovir ointment (ZOVIRAX) 5 % :Topical Every 3 Hours as needed for lesion   albuterol (PROVENTIL) (2.5 MG/3ML) 0.083% nebulizer solution Take 3 mLs (2.5 mg total) by nebulization every 4 (four) hours as needed for wheezing or shortness of breath.   albuterol (VENTOLIN HFA) 108 (90 Base) MCG/ACT inhaler INHALE TWO PUFFS BY MOUTH INTO LUNGS every SIX hours AS NEEDED FOR WHEEZING AND/OR SHORTNESS OF BREATH   ARIPiprazole (ABILIFY) 5 MG tablet Take 1 tablet (5 mg total) by mouth in the morning.   atorvastatin (LIPITOR) 40 MG tablet Take 1 tablet (40 mg total) by mouth every morning.    Budeson-Glycopyrrol-Formoterol (BREZTRI AEROSPHERE) 160-9-4.8 MCG/ACT AERO Inhale 2 puffs into the lungs in the morning and at bedtime.   carvedilol (COREG) 12.5 MG tablet TAKE ONE TABLET BY MOUTH EVERY MORNING and TAKE ONE TABLET BY MOUTH EVERYDAY AT BEDTIME   cetirizine (ZYRTEC) 10 MG tablet Take 10 mg by mouth at bedtime.   dextromethorphan-guaiFENesin (MUCINEX DM) 30-600 MG 12hr tablet Take 1 tablet by mouth 2 (two) times daily.   ezetimibe (ZETIA) 10 MG tablet Take 1 tablet (10 mg total) by mouth every morning.   gabapentin (NEURONTIN) 400 MG capsule Take 1 capsule (400 mg total) by mouth 2 (two) times daily.   leflunomide (ARAVA) 20 MG tablet Take 20 mg by mouth at bedtime.   LINZESS 145 MCG CAPS capsule Take 145 mcg by mouth daily before breakfast.   meloxicam (MOBIC) 15 MG tablet Take 15 mg by mouth at bedtime.   mirtazapine (REMERON) 15 MG tablet TAKE ONE TABLET BY MOUTH EVERYDAY AT BEDTIME   montelukast (SINGULAIR) 10 MG tablet TAKE ONE TABLET BY MOUTH EVERYDAY AT BEDTIME   Multiple Vitamin (MULTIVITAMIN WITH MINERALS) TABS tablet Take 1 tablet by mouth daily.   Nebulizer MISC Compressor nebulizer to use with nebulized medications as instructed   omeprazole (PRILOSEC) 40 MG capsule TAKE ONE CAPSULE BY MOUTH EVERY MORNING and TAKE ONE CAPSULE BY MOUTH EVERYDAY AT BEDTIME   oxyCODONE-acetaminophen (PERCOCET) 5-325 MG tablet Take 1 tablet by mouth  every 8 (eight) hours as needed for severe pain. Must last 30 days.   [START ON 11/29/2022] oxyCODONE-acetaminophen (PERCOCET) 5-325 MG tablet Take 1 tablet by mouth every 8 (eight) hours as needed for severe pain. Must last 30 days.   [START ON 12/29/2022] oxyCODONE-acetaminophen (PERCOCET) 5-325 MG tablet Take 1 tablet by mouth every 8 (eight) hours as needed for severe pain. Must last 30 days.   Oxymetazoline HCl (MUCINEX NASAL SPRAY MOISTURE NA) Place into the nose.   potassium chloride SA (KLOR-CON M) 20 MEQ tablet Take 1 tablet (20 mEq total)  by mouth daily with breakfast.   SPIKEVAX syringe    topiramate (TOPAMAX) 25 MG tablet Take 1 tablet (25 mg total) by mouth 2 (two) times daily.   valACYclovir (VALTREX) 1000 MG tablet TAKE TWO TABLETS BY MOUTH TWICE DAILY AS NEEDED FOR 5 DAYS FOR FEVER blisters   venlafaxine XR (EFFEXOR-XR) 75 MG 24 hr capsule TAKE THREE CAPSULES BY MOUTH EVERY MORNING   XIIDRA 5 % SOLN Place 1 drop into both eyes daily.   No facility-administered encounter medications on file as of 11/09/2022.    Allergies (verified) Sulfasalazine and Plaquenil [hydroxychloroquine]   History: Past Medical History:  Diagnosis Date   Alcohol abuse    Anemia    Anxiety    Arthritis    Cervicalgia    Cirrhosis ([place]) 1994   COPD (chronic obstructive pulmonary disease) ([place])    Depression    Difficult intubation    has plates and screws in neck   Dyspnea    GERD (gastroesophageal reflux disease)    Headache    Heart murmur    on heard when pt is lying   Hypertension    Other and unspecified hyperlipidemia    Status post insertion of spinal cord stimulator    Tachycardia    d/t questionable anxiety happens every 5- 10 years, sees [facility]   Past Surgical History:  Procedure Laterality Date   ANTERIOR CERVICAL DECOMP/DISCECTOMY FUSION  2012, 2015, 2018   x3   AUGMENTATION MAMMAPLASTY Bilateral [place] RELEASE  11/11/2011   Procedure: CARPAL TUNNEL RELEASE;  Surgeon: Floyce Stakes, MD;  Location: [facility];  Service: Neurosurgery;  Laterality: Right;  Right Median Nerve Decompression   CARPAL TUNNEL RELEASE  2013   COLONOSCOPY WITH PROPOFOL N/A 11/01/2017   Procedure: COLONOSCOPY WITH PROPOFOL;  Surgeon: Manya Silvas, MD;  Location: [facility] END[facility]COPY;  Service: Endoscopy;  Laterality: N/A;   ESOPHAGOGASTRODUODENOSCOPY     LUMBAR LAMINECTOMY/DECOMPRESSION MICRODISCECTOMY N/A 08/15/2019   Procedure: Lumbar three to Sacral one Decompressive lumbar  laminectomy;  Surgeon: Erline Levine, MD;  Location: [facility];  Service: Neurosurgery;  Laterality: N/A;   neck disc surgery     plates and screws in neck x 2   ROTATOR CUFF REPAIR  06/16/2016   right shoulder    ROTATOR CUFF REPAIR     SPINAL CORD STIMULATOR INSERTION N/A 03/17/2021   Procedure: THORACIC SPINAL CORD STIMULATOR (PERCUTANEOUS) & PULSE GENERATOR PLACEMENT;  Surgeon: [name], MD;  Location: [facility] ORS;  Service: Neurosurgery;  Laterality: N/A;   TONSILLECTOMY AND ADENOIDECTOMY  1973    TOTAL HIP ARTHROPLASTY Right 04/15/2021   Procedure: TOTAL HIP ARTHROPLASTY ANTERIOR APPROACH;  Surgeon: Hessie Knows, MD;  Location: [facility] ORS;  Service: Orthopedics;  Laterality: Right;   Family History  Problem Relation Age of Onset   Alcohol abuse Mother    Bipolar disorder  Mother    Suicidality Mother    Pancreatic cancer Father    Alcohol abuse Father    Drug abuse Sister    Alcohol abuse Sister    Depression Sister    Anesthesia problems Neg Hx    Breast cancer Neg Hx    Social History   Socioeconomic History   Marital status: Married    Spouse name: jerry   Number of children: 0   Years of education: Not on file   Highest education level: Bachelor's degree (e.g., BA, AB, BS)  Occupational History   Not on file  Tobacco Use   Smoking status: Former    Packs/day: 2.00    Years: 40.00    Additional pack years: 0.00    Total pack years: 80.00    Types: Cigarettes    Quit date: 10/30/2020    Years since quitting: 2.0   Smokeless tobacco: Never  Vaping Use   Vaping Use: Former   Devices: vape occasionally  Substance and Sexual Activity   Alcohol use: No    Comment: recovering alcoholic Since [id number]    Drug use: No   Sexual activity: Not Currently  Other Topics Concern   Not on file  Social History Narrative   Married; full time; does not get regular exercise.    Social Determinants of Health   Financial Resource Strain: Low Risk  (11/09/2022)   Overall Financial  Resource Strain (CARDIA)    Difficulty of Paying Living Expenses: Not hard at all  Food Insecurity: No Food Insecurity (11/09/2022)   Hunger Vital Sign    Worried About Running Out of Food in the Last Year: Never true    Ran Out of Food in the Last Year: Never true  Transportation Needs: No Transportation Needs (11/09/2022)   PRAPARE - Hydrologist (Medical): No    Lack of Transportation (Non-Medical): No  Physical Activity: Insufficiently Active (11/09/2022)   Exercise Vital Sign    Days of Exercise per Week: 3 days    Minutes of Exercise per Session: 30 min  Stress: No Stress Concern Present (11/09/2022)   [place]    Feeling of Stress : Not at all  Social Connections: Moderately Isolated (11/09/2022)   Social Connection and Isolation Panel [NHANES]    Frequency of Communication with Friends and Family: More than three times a week    Frequency of Social Gatherings with Friends and Family: More than three times a week    Attends Religious Services: Never    Marine scientist or Organizations: No    Attends Music therapist: Never    Marital Status: Married    Tobacco Counseling Counseling given: Not Answered   Clinical Intake:  Pre-visit preparation completed: Yes  Pain : 0-10 Pain Score: 6  Pain Type: Chronic pain Pain Location: Back Pain Orientation: Lower Pain Descriptors / Indicators: Aching Pain Onset: More than a month ago Pain Frequency: Intermittent Pain Relieving Factors: medications  Pain Relieving Factors: medications  BMI - recorded: 25.1 Nutritional Status: BMI 25 -29 Overweight Nutritional Risks: None Diabetes: No  How often do you need to have someone help you when you read instructions, pamphlets, or other written materials from your doctor or pharmacy?: 1 - Never  Diabetic?no  Interpreter Needed?: No  Comments: lives with  husband Information entered by :: B.Derrion Tritz,LPN   Activities of Daily Living    11/09/2022    3:01  PM 11/17/2021    9:27 AM  In your present state of health, do you have any difficulty performing the following activities:  Hearing? 0 0  Vision? 0 0  Difficulty concentrating or making decisions? 0 0  Walking or climbing stairs? 1 0  Dressing or bathing? 0 0  Doing errands, shopping? 0 0  Preparing Food and eating ? N   Using the Toilet? N   In the past six months, have you accidently leaked urine? N   Do you have problems with loss of bowel control? N   Managing your Medications? N   Managing your Finances? N   Housekeeping or managing your Housekeeping? N     Patient Care Team: Mikey Kirschner, PA-C as PCP - General (Physician Assistant) Minna Merritts, MD as PCP - Cardiology (Cardiology) Germaine Pomfret, [facility] (Pharmacist) Ursula Alert, MD as Consulting Physician (Psychiatry) [name], MD as Consulting Physician (Pain Medicine)  Indicate any recent Medical Services you may have received from other than [facility] providers in the past year (date may be approximate).     Assessment:   This is a routine wellness examination for Gabrielle Pineda.  Hearing/Vision screen Hearing Screening - Comments:: Adequate hearing Vision Screening - Comments:: Adequate vision w/contacts Dr Ellin Mayhew  Dietary issues and exercise activities discussed: Current Exercise Habits: Home exercise routine;Structured exercise class, Type of exercise: walking, Time (Minutes): 30, Frequency (Times/Week): 3, Weekly Exercise (Minutes/Week): 90, Intensity: Mild, Exercise limited by: cardiac condition(s);neurologic condition(s);orthopedic condition(s);respiratory conditions(s)   Goals Addressed   None    Depression Screen    11/09/2022    2:54 PM 08/13/2022    9:40 AM 07/27/2022    2:36 PM 05/13/2022    2:19 PM 04/22/2022    9:11 AM 04/21/2022    8:34 AM 04/07/2022    8:30 AM  PHQ 2/9 Scores  PHQ - 2  Score 0  0  0 0 0  PHQ- 9 Score   0   2      Information is confidential and restricted. Go to Review Flowsheets to unlock data.    Fall Risk    11/09/2022    2:52 PM 10/06/2022    8:21 AM 07/16/2022    9:06 AM 06/30/2022    8:33 AM 04/22/2022    9:11 AM  Fall Risk   Falls in the past year? 0 0 0 0 0  Number falls in past yr: 0  1    Injury with Fall? 0  0    Risk for fall due to : No Fall Risks  No Fall Risks    Follow up Education provided;Falls prevention discussed  Falls evaluation completed;Education provided;Falls prevention discussed      FALL RISK PREVENTION PERTAINING TO THE HOME:  Any stairs in or around the home? No  If so, are there any without handrails? No  Home free of loose throw rugs in walkways, pet beds, electrical cords, etc? Yes  Adequate lighting in your home to reduce risk of falls? Yes   ASSISTIVE DEVICES UTILIZED TO PREVENT FALLS:  Life alert? No  Use of a cane, walker or w/c? No  Grab bars in the bathroom? Yes  Shower chair or bench in shower? Yes  Elevated toilet seat or a handicapped toilet? No   Cognitive Function:        11/09/2022    3:05 PM  6CIT Screen  What Year? 0 points  What month? 0 points  What time? 0 points  Count back  from 20 0 points  Months in reverse 0 points  Repeat phrase 0 points  Total Score 0 points   Immunizations Immunization History  Administered Date(s) Administered   Fluad Quad(high Dose 65+) 10/01/2021, 05/14/2022   Influenza Split 09/07/2012   Influenza,inj,Quad PF,6+ Mos 11/18/2015, 07/23/2016, 07/02/2017, 04/14/2018, 04/17/2019, 04/19/2020   Influenza-Unspecified 09/24/2014   PFIZER(Purple Top)SARS-COV-2 Vaccination 11/21/2019, 12/12/2019   PNEUMOCOCCAL CONJUGATE-20 11/17/2021   Pneumococcal Polysaccharide-23 02/12/2011, 09/24/2014, 04/19/2020   Tdap 02/12/2011, 11/15/2015    TDAP status: Up to date  Flu Vaccine status: Up to date  Pneumococcal vaccine status: Up to date  Covid-19 vaccine  status: Completed vaccines  Qualifies for Shingles Vaccine? Yes   Zostavax completed yes    Screening Tests Health Maintenance  Topic Date Due   Zoster Vaccines- Shingrix (1 of 2) Never done   COVID-19 Vaccine (3 - Pfizer risk series) 01/09/2020   COLONOSCOPY (Pts 45-17yrs Insurance coverage will need to be confirmed)  11/02/2022   MAMMOGRAM  12/24/2022   Lung Cancer Screening  06/30/2023   Medicare Annual Wellness (AWV)  11/09/2023   DTaP/Tdap/Td (3 - Td or Tdap) 11/14/2025   Pneumonia Vaccine 35+ Years old  Completed   INFLUENZA VACCINE  Completed   DEXA SCAN  Completed   Hepatitis C Screening  Completed   HPV VACCINES  Aged Out    Health Maintenance  Health Maintenance Due  Topic Date Due   Zoster Vaccines- Shingrix (1 of 2) Never done   COVID-19 Vaccine (3 - Pfizer risk series) 01/09/2020   COLONOSCOPY (Pts 45-58yrs Insurance coverage will need to be confirmed)  11/02/2022    Colorectal cancer screening: Type of screening: Colonoscopy. Completed no. Repeat every 5 yearsordered  Mammogram status: Ordered yes. Pt provided with contact info and advised to call to schedule appt.   Bone Density status: Completed yes. Results reflect: Bone density results: NORMAL. Repeat every 5 years.  Lung Cancer Screening: (Low Dose CT Chest recommended if Age 83-80 years, 30 pack-year currently smoking OR have quit w/in 15years.) does not qualify.   Lung Cancer Screening Referral: no  Additional Screening:  Hepatitis C Screening: does not qualify; Completed yes  Vision Screening: Recommended annual ophthalmology exams for early detection of glaucoma and other disorders of the eye. Is the patient up to date with their annual eye exam?  Yes  Who is the provider or what is the name of the office in which the patient attends annual eye exams? Dr Ellin Mayhew If pt is not established with a provider, would they like to be referred to a provider to establish care? No .   Dental Screening:  Recommended annual dental exams for proper oral hygiene  Community Resource Referral / Chronic Care Management: CRR required this visit?  No   CCM required this visit?  No      Plan:     I have personally reviewed and noted the following in the patient's chart:   Medical and social history Use of alcohol, tobacco or illicit drugs  Current medications and supplements including opioid prescriptions. Patient is currently taking opioid prescriptions. Information provided to patient regarding non-opioid alternatives. Patient advised to discuss non-opioid treatment plan with their provider. Functional ability and status Nutritional status Physical activity Advanced directives List of other physicians Hospitalizations, surgeries, and ER visits in previous 12 months Vitals Screenings to include cognitive, depression, and falls Referrals and appointments  In addition, I have reviewed and discussed with patient certain preventive protocols, quality metrics, and best practice  recommendations. A written personalized care plan for preventive services as well as general preventive health recommendations were provided to patient.     Roger Shelter, LPN   [id number]   Nurse Notes: pt is currently doing cardiac rehab 2-3 times weekly and is walking some. Pt says she is doing alright and has no questions or concerns during the visit.

## 2022-11-09 NOTE — Patient Instructions (Signed)
[name] , Thank you for taking time to come for your Medicare Wellness Visit. I appreciate your ongoing commitment to your health goals. Please review the following plan we discussed and let me know if I can assist you in the future.   These are the goals we discussed:  Goals      DIET - EAT MORE FRUITS AND VEGETABLES     Track and Manage My Triggers-COPD     Timeframe:  Long-Range Goal Priority:  High Start Date: 11/25/2020                            Expected End Date:  05/28/2023                     Follow Up within 30 days   - avoid second hand smoke - eliminate smoking in my home - identify and remove indoor air pollutants - limit outdoor activity during cold weather    Why is this important?   Triggers are activities or things, like tobacco smoke or cold weather, that make your COPD (chronic obstructive pulmonary disease) flare-up.  Knowing these triggers helps you plan how to stay away from them.  When you cannot remove them, you can learn how to manage them.     Notes:         This is a list of the screening recommended for you and due dates:  Health Maintenance  Topic Date Due   Zoster (Shingles) Vaccine (1 of 2) Never done   COVID-19 Vaccine (3 - Pfizer risk series) 01/09/2020   Colon Cancer Screening  11/02/2022   Mammogram  12/24/2022   Screening for Lung Cancer  06/30/2023   Medicare Annual Wellness Visit  11/09/2023   DTaP/Tdap/Td vaccine (3 - Td or Tdap) 11/14/2025   Pneumonia Vaccine  Completed   Flu Shot  Completed   DEXA scan (bone density measurement)  Completed   Hepatitis C Screening: USPSTF Recommendation to screen - Ages 67-79 yo.  Completed   HPV Vaccine  Aged Out    Advanced directives: no  Conditions/risks identified: none  Next appointment: Follow up in one year for your annual wellness visit 11/10/2023 @2pm  telephone   Preventive Care 67 Years and Older, Female Preventive care refers to lifestyle choices and visits with your  health care provider that can promote health and wellness. What does preventive care include? A yearly physical exam. This is also called an annual well check. Dental exams once or twice a year. Routine eye exams. Ask your health care provider how often you should have your eyes checked. Personal lifestyle choices, including: Daily care of your teeth and gums. Regular physical activity. Eating a healthy diet. Avoiding tobacco and drug use. Limiting alcohol use. Practicing safe sex. Taking low-dose aspirin every day. Taking vitamin and mineral supplements as recommended by your health care provider. What happens during an annual well check? The services and screenings done by your health care provider during your annual well check will depend on your age, overall health, lifestyle risk factors, and family history of disease. Counseling  Your health care provider may ask you questions about your: Alcohol use. Tobacco use. Drug use. Emotional well-being. Home and relationship well-being. Sexual activity. Eating habits. History of falls. Memory and ability to understand (cognition). Work and work Statistician. Reproductive health. Screening  You may have the following tests or measurements: Height, weight, and BMI. Blood pressure. Lipid and  cholesterol levels. These may be checked every 5 years, or more frequently if you are over 3 years old. Skin check. Lung cancer screening. You may have this screening every year starting at age 67 if you have a 30-pack-year history of smoking and currently smoke or have quit within the past 15 years. Fecal occult blood test (FOBT) of the stool. You may have this test every year starting at age 67. Flexible sigmoidoscopy or colonoscopy. You may have a sigmoidoscopy every 5 years or a colonoscopy every 10 years starting at age 67. Hepatitis C blood test. Hepatitis B blood test. Sexually transmitted disease (STD) testing. Diabetes screening. This  is done by checking your blood sugar (glucose) after you have not eaten for a while (fasting). You may have this done every 1-3 years. Bone density scan. This is done to screen for osteoporosis. You may have this done starting at age 28. Mammogram. This may be done every 67-2 years. Talk to your health care provider about how often you should have regular mammograms. Talk with your health care provider about your test results, treatment options, and if necessary, the need for more tests. Vaccines  Your health care provider may recommend certain vaccines, such as: Influenza vaccine. This is recommended every year. Tetanus, diphtheria, and acellular pertussis (Tdap, Td) vaccine. You may need a Td booster every 10 years. Zoster vaccine. You may need this after age 67. Pneumococcal 13-valent conjugate (PCV13) vaccine. One dose is recommended after age 67. Pneumococcal polysaccharide (PPSV23) vaccine. One dose is recommended after age 67. Talk to your health care provider about which screenings and vaccines you need and how often you need them. This information is not intended to replace advice given to you by your health care provider. Make sure you discuss any questions you have with your health care provider. Document Released: 08/30/2015 Document Revised: 04/22/2016 Document Reviewed: 06/04/2015 Elsevier Interactive Patient Education  2017 [employer] Prevention in the Home Falls can cause injuries. They can happen to people of all ages. There are many things you can do to make your home safe and to help prevent falls. What can I do on the outside of my home? Regularly fix the edges of walkways and driveways and fix any cracks. Remove anything that might make you trip as you walk through a door, such as a raised step or threshold. Trim any bushes or trees on the path to your home. Use bright outdoor lighting. Clear any walking paths of anything that might make someone trip, such as  rocks or tools. Regularly check to see if handrails are loose or broken. Make sure that both sides of any steps have handrails. Any raised decks and porches should have guardrails on the edges. Have any leaves, snow, or ice cleared regularly. Use sand or salt on walking paths during winter. Clean up any spills in your garage right away. This includes oil or grease spills. What can I do in the bathroom? Use night lights. Install grab bars by the toilet and in the tub and shower. Do not use towel bars as grab bars. Use non-skid mats or decals in the tub or shower. If you need to sit down in the shower, use a plastic, non-slip stool. Keep the floor dry. Clean up any water that spills on the floor as soon as it happens. Remove soap buildup in the tub or shower regularly. Attach bath mats securely with double-sided non-slip rug tape. Do not have throw rugs and other  things on the floor that can make you trip. What can I do in the bedroom? Use night lights. Make sure that you have a light by your bed that is easy to reach. Do not use any sheets or blankets that are too big for your bed. They should not hang down onto the floor. Have a firm chair that has side arms. You can use this for support while you get dressed. Do not have throw rugs and other things on the floor that can make you trip. What can I do in the kitchen? Clean up any spills right away. Avoid walking on wet floors. Keep items that you use a lot in easy-to-reach places. If you need to reach something above you, use a strong step stool that has a grab bar. Keep electrical cords out of the way. Do not use floor polish or wax that makes floors slippery. If you must use wax, use non-skid floor wax. Do not have throw rugs and other things on the floor that can make you trip. What can I do with my stairs? Do not leave any items on the stairs. Make sure that there are handrails on both sides of the stairs and use them. Fix handrails  that are broken or loose. Make sure that handrails are as long as the stairways. Check any carpeting to make sure that it is firmly attached to the stairs. Fix any carpet that is loose or worn. Avoid having throw rugs at the top or bottom of the stairs. If you do have throw rugs, attach them to the floor with carpet tape. Make sure that you have a light switch at the top of the stairs and the bottom of the stairs. If you do not have them, ask someone to add them for you. What else can I do to help prevent falls? Wear shoes that: Do not have high heels. Have rubber bottoms. Are comfortable and fit you well. Are closed at the toe. Do not wear sandals. If you use a stepladder: Make sure that it is fully opened. Do not climb a closed stepladder. Make sure that both sides of the stepladder are locked into place. Ask someone to hold it for you, if possible. Clearly mark and make sure that you can see: Any grab bars or handrails. First and last steps. Where the edge of each step is. Use tools that help you move around (mobility aids) if they are needed. These include: Canes. Walkers. Scooters. Crutches. Turn on the lights when you go into a dark area. Replace any light bulbs as soon as they burn out. Set up your furniture so you have a clear path. Avoid moving your furniture around. If any of your floors are uneven, fix them. If there are any pets around you, be aware of where they are. Review your medicines with your doctor. Some medicines can make you feel dizzy. This can increase your chance of falling. Ask your doctor what other things that you can do to help prevent falls. This information is not intended to replace advice given to you by your health care provider. Make sure you discuss any questions you have with your health care provider. Document Released: 05/30/2009 Document Revised: 01/09/2016 Document Reviewed: 09/07/2014 Elsevier Interactive Patient Education  2017 [employer].

## 2022-11-10 ENCOUNTER — Ambulatory Visit
Payer: Medicare Other | Attending: Student in an Organized Health Care Education/Training Program | Admitting: Student in an Organized Health Care Education/Training Program

## 2022-11-10 ENCOUNTER — Encounter: Payer: Self-pay | Admitting: Student in an Organized Health Care Education/Training Program

## 2022-11-10 VITALS — BP 144/77 | HR 68 | Temp 97.0°F | Resp 16 | Ht 69.0 in | Wt 170.0 lb

## 2022-11-10 DIAGNOSIS — M5416 Radiculopathy, lumbar region: Secondary | ICD-10-CM | POA: Diagnosis not present

## 2022-11-10 DIAGNOSIS — M48061 Spinal stenosis, lumbar region without neurogenic claudication: Secondary | ICD-10-CM | POA: Diagnosis not present

## 2022-11-10 DIAGNOSIS — M48062 Spinal stenosis, lumbar region with neurogenic claudication: Secondary | ICD-10-CM

## 2022-11-10 DIAGNOSIS — G8929 Other chronic pain: Secondary | ICD-10-CM | POA: Insufficient documentation

## 2022-11-10 DIAGNOSIS — G894 Chronic pain syndrome: Secondary | ICD-10-CM | POA: Insufficient documentation

## 2022-11-10 NOTE — Progress Notes (Signed)
PROVIDER NOTE: Information contained herein reflects review and annotations entered in association with encounter. Interpretation of such information and data should be left to medically-trained personnel. Information provided to patient can be located elsewhere in the medical record under "Patient Instructions". Document created using STT-dictation technology, any transcriptional errors that may result from process are unintentional.    Patient: Gabrielle Pineda  Service Category: E/M  Provider: [name], MD  DOB: April 28, 1956  DOS: 11/10/2022  Referring Provider: Emelia Loron  MRN: [id number]  Specialty: Interventional Pain Management  PCP: Mikey Kirschner, PA-C  Type: Established Patient  Setting: Ambulatory outpatient    Location: Office  Delivery: Face-to-face     HPI  [name], a 67 y.o. year old female, is here today because of her Lumbar radiculopathy [M54.16]. Ms. Slonecker primary complain today is Back Pain (lower)  Pertinent problems: Ms. Co has Closed compression fracture of L5 lumbar vertebra; Cervical radiculopathy; Neuropathy; Spinal stenosis, lumbar region, with neurogenic claudication; Spondylosis of cervical region without myelopathy or radiculopathy; Cervical fusion syndrome; Chronic pain syndrome; Postlaminectomy syndrome, lumbar region; Chronic radicular lumbar pain; and Status post total hip replacement, right on their pertinent problem list. Pain Assessment: Severity of Chronic pain is reported as a 6 /10. Location: Back Lower, Right, Left/buttocks/ hips   down back of legs. Onset: More than a month ago. Quality: Aching, Stabbing, Tender, Grimacing, Discomfort. Timing: Constant. Modifying factor(s): medication, rest. Vitals:  height is 5\' 9"  (1.753 m) and weight is 170 lb (77.1 kg). Her temperature is 97 F (36.1 C) (abnormal). Her blood pressure is 144/77 (abnormal) and her pulse is 68. Her respiration is 16 and oxygen saturation is 99%.  BMI:  Estimated body mass index is 25.1 kg/m as calculated from the following:   Height as of this encounter: 5\' 9"  (1.753 m).   Weight as of this encounter: 170 lb (77.1 kg). Last encounter: 10/06/2022. Last procedure: Visit date not found.  Reason for encounter: follow-up evaluation to review cervical MRI and potential interventional treatments  Patient follows up today to review her lumbar MRI.  She endorses worsening low back pain with radiation into her posterior lateral thigh stopping usually above her knee.  She states that she was able to get her spinal cord stimulator functional again after charging her battery.  She can feel the paresthesia by stimulation but states that she is not experiencing significant pain relief with it.  She continues her medications as prescribed.   Monitoring: [place] PMP: PDMP reviewed during this encounter.       Pharmacotherapy: No side-effects or adverse reactions reported. Compliance: No problems identified. Effectiveness: Clinically acceptable.  Ignatius Specking, RN  11/10/2022 10:55 AM  Sign when Signing Visit Safety precautions to be maintained throughout the outpatient stay will include: orient to surroundings, keep bed in low position, maintain call bell within reach at all times, provide assistance with transfer out of bed and ambulation.     No results found for: "CBDTHCR" No results found for: "D8THCCBX" No results found for: "D9THCCBX"  UDS:  Summary  Date Value Ref Range Status  10/06/2022 Note  Final    Comment:    ==================================================================== [facility] (MW) ==================================================================== Test                             Result       Flag       Units  Drug Present and Declared  for Prescription Verification   Oxycodone                      1533         EXPECTED   ng/mg creat   Oxymorphone                    581          EXPECTED   ng/mg creat    Noroxycodone                   1658         EXPECTED   ng/mg creat   Noroxymorphone                 175          EXPECTED   ng/mg creat    Sources of oxycodone are scheduled prescription medications.    Oxymorphone, noroxycodone, and noroxymorphone are expected    metabolites of oxycodone. Oxymorphone is also available as a    scheduled prescription medication.  ==================================================================== Test                      Result    Flag   Units      Ref Range   Creatinine              36               mg/dL      >=20 ==================================================================== Declared Medications:  The flagging and interpretation on this report are based on the  following declared medications.  Unexpected results may arise from  inaccuracies in the declared medications.   **Note: The testing scope of this panel includes these medications:   Oxycodone   **Note: The testing scope of this panel does not include the  following reported medications:   Acetaminophen  Acyclovir (Zovirax)  Albuterol (Ventolin HFA)  Aripiprazole (Abilify)  Atorvastatin (Lipitor)  Budesonide (Breztri Aerosphere)  Carvedilol (Coreg)  Eye Drops  Ezetimibe (Zetia)  Formoterol (Breztri Aerosphere)  Gabapentin (Neurontin)  Glycopyrrolate (Breztri Aerosphere)  Leflunomide (Arava)  Linaclotide (Linzess)  Meloxicam (Mobic)  Mirtazapine (Remeron)  Montelukast (Singulair)  Multivitamin  Omeprazole (Prilosec)  Potassium (Klor-Con)  Topiramate (Topamax)  Valacyclovir (Valtrex)  Venlafaxine (Effexor) ==================================================================== For clinical consultation, please call [phone]. ====================================================================       ROS  Constitutional: Denies any fever or chills Gastrointestinal: No reported hemesis, hematochezia, vomiting, or acute GI distress Musculoskeletal:  Low back pain  with radiation into posterior lateral legs stopping proximal to her knees Neurological: No reported episodes of acute onset apraxia, aphasia, dysarthria, agnosia, amnesia, paralysis, loss of coordination, or loss of consciousness  Medication Review  ARIPiprazole, Budeson-Glycopyrrol-Formoterol, COVID-19 mRNA vaccine 2023-2024, Lifitegrast, Nebulizer, Oxymetazoline HCl, acyclovir ointment, albuterol, atorvastatin, carvedilol, cetirizine, dextromethorphan-guaiFENesin, ezetimibe, gabapentin, leflunomide, linaclotide, meloxicam, mirtazapine, montelukast, multivitamin with minerals, omeprazole, oxyCODONE-acetaminophen, potassium chloride SA, topiramate, valACYclovir, and venlafaxine XR  History Review  Allergy: Ms. Lorenson is allergic to sulfasalazine and plaquenil [hydroxychloroquine]. Drug: Ms. Schwaiger  reports no history of drug use. Alcohol:  reports no history of alcohol use. Tobacco:  reports that she quit smoking about 2 years ago. Her smoking use included cigarettes. She has a 80.00 pack-year smoking history. She has never used smokeless tobacco. Social: Ms. Taff  reports that she quit smoking about 2 years ago. Her smoking use included cigarettes. She has a 80.00 pack-year smoking history. She has never used smokeless tobacco.  She reports that she does not drink alcohol and does not use drugs. Medical:  has a past medical history of Alcohol abuse, Anemia, Anxiety, Arthritis, Cervicalgia, Cirrhosis ([place]) (1994), COPD (chronic obstructive pulmonary disease) ([place]), Depression, Difficult intubation, Dyspnea, GERD (gastroesophageal reflux disease), Headache, Heart murmur, Hypertension, Other and unspecified hyperlipidemia, Status post insertion of spinal cord stimulator, and Tachycardia. Surgical: Ms. Mana  has a past surgical history that includes neck disc surgery; Breast enhancement surgery (1981); Esophagogastroduodenoscopy; Tonsillectomy and adenoidectomy (1973 ); Carpal tunnel release  (11/11/2011); Carpal tunnel release (2013); Rotator cuff repair (06/16/2016); Rotator cuff repair; Colonoscopy with propofol (N/A, 11/01/2017); Augmentation mammaplasty (Bilateral, 1982); Anterior cervical decomp/discectomy fusion (2012, 2015, 2018); Lumbar laminectomy/decompression microdiscectomy (N/A, 08/15/2019); Spinal cord stimulator insertion (N/A, 03/17/2021); and Total hip arthroplasty (Right, 04/15/2021). Family: family history includes Alcohol abuse in her father, mother, and sister; Bipolar disorder in her mother; Depression in her sister; Drug abuse in her sister; Pancreatic cancer in her father; Suicidality in her mother.  Laboratory Chemistry Profile   Renal Lab Results  Component Value Date   BUN 11 09/11/2022   CREATININE 1.00 09/11/2022   BCR 5 (L) 11/17/2021   GFRAA 72 05/06/2020   GFRNONAA >60 09/11/2022    Hepatic Lab Results  Component Value Date   AST 22 09/11/2022   ALT 17 09/11/2022   ALBUMIN 3.7 09/11/2022   ALKPHOS 99 09/11/2022   LIPASE 63 03/23/2018    Electrolytes Lab Results  Component Value Date   NA 139 09/11/2022   K 4.1 09/11/2022   CL 109 09/11/2022   CALCIUM 8.8 (L) 09/11/2022   PHOS 3.2 05/06/2020    Bone No results found for: "VD25OH", "VD125OH2TOT", "[id number]", "[id number]", "25OHVITD1", "25OHVITD2", "25OHVITD3", "TESTOFREE", "TESTOSTERONE"  Inflammation (CRP: Acute Phase) (ESR: Chronic Phase) No results found for: "CRP", "ESRSEDRATE", "LATICACIDVEN"       Note: Above Lab results reviewed.  Recent Imaging Review  MR LUMBAR SPINE WO CONTRAST CLINICAL DATA:  Low back pain with lumbar radiculopathy. Bilateral hip and leg pain.  EXAM: MRI LUMBAR SPINE WITHOUT CONTRAST  TECHNIQUE: Multiplanar, multisequence MR imaging of the lumbar spine was performed. No intravenous contrast was administered.  COMPARISON:  Lumbar spine MRI 06/20/2020.  FINDINGS: Segmentation: Conventional numbering is assumed with 5 non-rib-bearing, lumbar type  vertebral bodies.  Alignment:  New grade 1 anterolisthesis of L4 on L5.  Vertebrae: Prior L3-S1 laminectomy. Unchanged chronic, degenerative height loss of the L5 vertebral body.  Conus medullaris and cauda equina: Conus extends to the L2 level. Conus and cauda equina appear normal.  Paraspinal and other soft tissues: Fatty atrophy of the paraspinal muscles. Spinal cord stimulator enters the dorsal epidural space at T11-12.  Disc levels:  T12-L1: Small disc bulge and mild bilateral facet arthropathy. No spinal canal stenosis or neural foraminal narrowing.  L1-L2: Small disc bulge and mild bilateral facet arthropathy. No spinal canal stenosis or neural foraminal narrowing.  L2-L3: Small disc bulge and mild bilateral facet arthropathy. No spinal canal stenosis or neural foraminal narrowing.  L3-L4: Prior laminectomy. Disc bulge with central annular fissure. Mild bilateral facet arthropathy. No spinal canal stenosis or neural foraminal narrowing.  L4-L5: Anterolisthesis with uncovered disc and superimposed disc bulge. Severe bilateral facet arthropathy with joint effusions, periarticular edema, and medially projecting synovial cyst, measuring up to 12 x 4 mm (image 30 series 6). Severe spinal canal stenosis and severe bilateral neural foraminal narrowing, worse from prior.  L5-S1: Prior laminectomy. Small disc bulge and mild bilateral facet arthropathy. No spinal canal  stenosis. Moderate bilateral neural foraminal narrowing.  IMPRESSION: 1. Progression of lower lumbar spondylosis, worst at L4-L5, where there is now severe spinal canal stenosis and severe bilateral neural foraminal narrowing. 2. Moderate bilateral neural foraminal narrowing at L5-S1.  Electronically Signed   By: Emmit Alexanders M.D.   On: 11/03/2022 16:47 Note: Reviewed        Physical Exam  General appearance: Well nourished, well developed, and well hydrated. In no apparent acute distress Mental  status: Alert, oriented x 3 (person, place, & time)       Respiratory: No evidence of acute respiratory distress Eyes: PERLA Vitals: BP (!) 144/77   Pulse 68   Temp (!) 97 F (36.1 C)   Resp 16   Ht 5\' 9"  (1.753 m)   Wt 170 lb (77.1 kg)   SpO2 99%   BMI 25.10 kg/m  BMI: Estimated body mass index is 25.1 kg/m as calculated from the following:   Height as of this encounter: 5\' 9"  (1.753 m).   Weight as of this encounter: 170 lb (77.1 kg). Ideal: Ideal body weight: 66.2 kg (145 lb 15.1 oz) Adjusted ideal body weight: 70.6 kg (155 lb 9.1 oz)  Lumbar Spine Area Exam  Skin & Axial Inspection: Well healed scar from previous spine surgery detected Alignment: Symmetrical Functional ROM: Pain restricted ROM affecting both sides   Gait & Posture Assessment  Ambulation: Limited Gait: Antalgic Posture: Difficulty standing up straight, due to pain    Lower Extremity Exam      Side: Right lower extremity   Side: Left lower extremity  Stability: No instability observed           Stability: No instability observed          Skin & Extremity Inspection: Skin color, temperature, and hair growth are WNL. No peripheral edema or cyanosis. No masses, redness, swelling, asymmetry, or associated skin lesions. No contractures.   Skin & Extremity Inspection: Skin color, temperature, and hair growth are WNL. No peripheral edema or cyanosis. No masses, redness, swelling, asymmetry, or associated skin lesions. No contractures.  Functional ROM: Pain restricted ROM for hip and knee joints Limited SLR (straight leg raise)   Functional ROM: Pain restricted ROM for hip and knee joints Limited SLR (straight leg raise)  Muscle Tone/Strength: Functionally intact. No obvious neuro-muscular anomalies detected.   Muscle Tone/Strength: Functionally intact. No obvious neuro-muscular anomalies detected.  Sensory (Neurological): Dermatomal pain pattern         Sensory (Neurological): Dermatomal pain pattern         DTR: Patellar: deferred today Achilles: deferred today Plantar: deferred today   DTR: Patellar: deferred today Achilles: deferred today Plantar: deferred today  Palpation: No palpable anomalies   Palpation: No palpable anomalies     Assessment   Diagnosis Status  1. Lumbar radiculopathy (R>L)   2. Chronic radicular lumbar pain   3. Spinal stenosis, lumbar region, with neurogenic claudication   4. Chronic pain syndrome   5. Lumbar foraminal stenosis    Having a Flare-up Having a Flare-up Having a Flare-up     Plan of Care  Reviewed MRI with patient in great detail.  She is symptomatic at L4-L5 related to severe spinal canal stenosis, bilateral neuroforaminal stenosis and also possibly severe lumbar facet arthropathy.  We discussed a bilateral L4 transforaminal epidural steroid injection to address her radicular pain related to foraminal stenosis bilaterally.  She has a history of lumbar laminectomy at L3-L4 and L5-S1.  She is  utilizing her spinal cord stimulator and also taking multimodal analgesics as above.  She is also trying to perform home stretching and strengthening exercises for her low back with limited benefit.   Orders:  Orders Placed This Encounter  Procedures   Lumbar Transforaminal Epidural    Standing Status:   Future    Standing Expiration Date:   02/10/2023    Scheduling Instructions:     Bilateral L4       Sedation: IV Versed     Timeframe: ASAP    Order Specific Question:   Where will this procedure be performed?    Answer:   [facility]   Follow-up plan:   Return in about 27 days (around 12/07/2022) for B/L L4 TF ESI , in clinic IV Versed.      Recent Visits Date Type Provider Dept  10/06/22 Office Visit [name], MD [facility]  Showing recent visits within past 90 days and meeting all other requirements Today's Visits Date Type Provider Dept  11/10/22 Office Visit [name], MD [facility]  Showing today's  visits and meeting all other requirements Future Appointments Date Type Provider Dept  01/19/23 Appointment [name], MD [facility]  Showing future appointments within next 90 days and meeting all other requirements  I discussed the assessment and treatment plan with the patient. The patient was provided an opportunity to ask questions and all were answered. The patient agreed with the plan and demonstrated an understanding of the instructions.  Patient advised to call back or seek an in-person evaluation if the symptoms or condition worsens.  Duration of encounter: 66minutes.  Total time on encounter, as per AMA guidelines included both the face-to-face and non-face-to-face time personally spent by the physician and/or other qualified health care professional(s) on the day of the encounter (includes time in activities that require the physician or other qualified health care professional and does not include time in activities normally performed by clinical staff). Physician's time may include the following activities when performed: Preparing to see the patient (e.g., pre-charting review of records, searching for previously ordered imaging, lab work, and nerve conduction tests) Review of prior analgesic pharmacotherapies. Reviewing PMP Interpreting ordered tests (e.g., lab work, imaging, nerve conduction tests) Performing post-procedure evaluations, including interpretation of diagnostic procedures Obtaining and/or reviewing separately obtained history Performing a medically appropriate examination and/or evaluation Counseling and educating the patient/family/caregiver Ordering medications, tests, or procedures Referring and communicating with other health care professionals (when not separately reported) Documenting clinical information in the electronic or other health record Independently interpreting results (not separately reported) and communicating results to the  patient/ family/caregiver Care coordination (not separately reported)  Note by: [name], MD Date: 11/10/2022; Time: 11:49 AM

## 2022-11-10 NOTE — Patient Instructions (Signed)
GENERAL RISKS AND COMPLICATIONS ° °What are the risk, side effects and possible complications? °Generally speaking, most procedures are safe.  However, with any procedure there are risks, side effects, and the possibility of complications.  The risks and complications are dependent upon the sites that are lesioned, or the type of nerve block to be performed.  The closer the procedure is to the spine, the more serious the risks are.  Great care is taken when placing the radio frequency needles, block needles or lesioning probes, but sometimes complications can occur. °Infection: Any time there is an injection through the skin, there is a risk of infection.  This is why sterile conditions are used for these blocks.  There are four possible types of infection. °Localized skin infection. °Central Nervous System Infection-This can be in the form of Meningitis, which can be deadly. °Epidural Infections-This can be in the form of an epidural abscess, which can cause pressure inside of the spine, causing compression of the spinal cord with subsequent paralysis. This would require an emergency surgery to decompress, and there are no guarantees that the patient would recover from the paralysis. °Discitis-This is an infection of the intervertebral discs.  It occurs in about 1% of discography procedures.  It is difficult to treat and it may lead to surgery. ° °      2. Pain: the needles have to go through skin and soft tissues, will cause soreness. °      3. Damage to internal structures:  The nerves to be lesioned may be near blood vessels or   ° other nerves which can be potentially damaged. °      4. Bleeding: Bleeding is more common if the patient is taking blood thinners such as  aspirin, Coumadin, Ticiid, Plavix, etc., or if he/she have some genetic predisposition  such as hemophilia. Bleeding into the spinal canal can cause compression of the spinal  cord with subsequent paralysis.  This would require an emergency  surgery to  decompress and there are no guarantees that the patient would recover from the  paralysis. °      5. Pneumothorax:  Puncturing of a lung is a possibility, every time a needle is introduced in  the area of the chest or upper back.  Pneumothorax refers to free air around the  collapsed lung(s), inside of the thoracic cavity (chest cavity).  Another two possible  complications related to a similar event would include: Hemothorax and Chylothorax.   These are variations of the Pneumothorax, where instead of air around the collapsed  lung(s), you may have blood or chyle, respectively. °      6. Spinal headaches: They may occur with any procedures in the area of the spine. °      7. Persistent CSF (Cerebro-Spinal Fluid) leakage: This is a rare problem, but may occur  with prolonged intrathecal or epidural catheters either due to the formation of a fistulous  track or a dural tear. °      8. Nerve damage: By working so close to the spinal cord, there is always a possibility of  nerve damage, which could be as serious as a permanent spinal cord injury with  paralysis. °      9. Death:  Although rare, severe deadly allergic reactions known as "Anaphylactic  reaction" can occur to any of the medications used. °     10. Worsening of the symptoms:  We can always make thing worse. ° °What are the chances   of something like this happening? Chances of any of this occuring are extremely low.  By statistics, you have more of a chance of getting killed in a motor vehicle accident: while driving to the hospital than any of the above occurring .  Nevertheless, you should be aware that they are possibilities.  In general, it is similar to taking a shower.  Everybody knows that you can slip, hit your head and get killed.  Does that mean that you should not shower again?  Nevertheless always keep in mind that statistics do not mean anything if you happen to be on the wrong side of them.  Even if a procedure has a 1 (one) in a  1,000,000 (million) chance of going wrong, it you happen to be that one..Also, keep in mind that by statistics, you have more of a chance of having something go wrong when taking medications.  Who should not have this procedure? If you are on a blood thinning medication (e.g. Coumadin, Plavix, see list of "Blood Thinners"), or if you have an active infection going on, you should not have the procedure.  If you are taking any blood thinners, please inform your physician.  How should I prepare for this procedure? Do not eat or drink anything at least six hours prior to the procedure. Bring a driver with you .  It cannot be a taxi. Come accompanied by an adult that can drive you back, and that is strong enough to help you if your legs get weak or numb from the local anesthetic. Take all of your medicines the morning of the procedure with just enough water to swallow them. If you have diabetes, make sure that you are scheduled to have your procedure done first thing in the morning, whenever possible. If you have diabetes, take only half of your insulin dose and notify our nurse that you have done so as soon as you arrive at the clinic. If you are diabetic, but only take blood sugar pills (oral hypoglycemic), then do not take them on the morning of your procedure.  You may take them after you have had the procedure. Do not take aspirin or any aspirin-containing medications, at least eleven (11) days prior to the procedure.  They may prolong bleeding. Wear loose fitting clothing that may be easy to take off and that you would not mind if it got stained with Betadine or blood. Do not wear any jewelry or perfume Remove any nail coloring.  It will interfere with some of our monitoring equipment.  NOTE: Remember that this is not meant to be interpreted as a complete list of all possible complications.  Unforeseen problems may occur.  BLOOD THINNERS The following drugs contain aspirin or other products,  which can cause increased bleeding during surgery and should not be taken for 2 weeks prior to and 1 week after surgery.  If you should need take something for relief of minor pain, you may take acetaminophen which is found in Tylenol,m Datril, Anacin-3 and Panadol. It is not blood thinner. The products listed below are.  Do not take any of the products listed below in addition to any listed on your instruction sheet.  A.P.C or A.P.C with Codeine Codeine Phosphate Capsules #3 Ibuprofen Ridaura  ABC compound Congesprin Imuran rimadil  Advil Cope Indocin Robaxisal  Alka-Seltzer Effervescent Pain Reliever and Antacid Coricidin or Coricidin-D  Indomethacin Rufen  Alka-Seltzer plus Cold Medicine Cosprin Ketoprofen S-A-C Tablets  Anacin Analgesic Tablets or Capsules Coumadin   Korlgesic Salflex  Anacin Extra Strength Analgesic tablets or capsules CP-2 Tablets Lanoril Salicylate  Anaprox Cuprimine Capsules Levenox Salocol  Anexsia-D Dalteparin Magan Salsalate  Anodynos Darvon compound Magnesium Salicylate Sine-off  Ansaid Dasin Capsules Magsal Sodium Salicylate  Anturane Depen Capsules Marnal Soma  APF Arthritis pain formula Dewitt's Pills Measurin Stanback  Argesic Dia-Gesic Meclofenamic Sulfinpyrazone  Arthritis Bayer Timed Release Aspirin Diclofenac Meclomen Sulindac  Arthritis pain formula Anacin Dicumarol Medipren Supac  Analgesic (Safety coated) Arthralgen Diffunasal Mefanamic Suprofen  Arthritis Strength Bufferin Dihydrocodeine Mepro Compound Suprol  Arthropan liquid Dopirydamole Methcarbomol with Aspirin Synalgos  ASA tablets/Enseals Disalcid Micrainin Tagament  Ascriptin Doan's Midol Talwin  Ascriptin A/D Dolene Mobidin Tanderil  Ascriptin Extra Strength Dolobid Moblgesic Ticlid  Ascriptin with Codeine Doloprin or Doloprin with Codeine Momentum Tolectin  Asperbuf Duoprin Mono-gesic Trendar  Aspergum Duradyne Motrin or Motrin IB Triminicin  Aspirin plain, buffered or enteric coated  Durasal Myochrisine Trigesic  Aspirin Suppositories Easprin Nalfon Trillsate  Aspirin with Codeine Ecotrin Regular or Extra Strength Naprosyn Uracel  Atromid-S Efficin Naproxen Ursinus  Auranofin Capsules Elmiron Neocylate Vanquish  Axotal Emagrin Norgesic Verin  Azathioprine Empirin or Empirin with Codeine Normiflo Vitamin E  Azolid Emprazil Nuprin Voltaren  Bayer Aspirin plain, buffered or children's or timed BC Tablets or powders Encaprin Orgaran Warfarin Sodium  Buff-a-Comp Enoxaparin Orudis Zorpin  Buff-a-Comp with Codeine Equegesic Os-Cal-Gesic   Buffaprin Excedrin plain, buffered or Extra Strength Oxalid   Bufferin Arthritis Strength Feldene Oxphenbutazone   Bufferin plain or Extra Strength Feldene Capsules Oxycodone with Aspirin   Bufferin with Codeine Fenoprofen Fenoprofen Pabalate or Pabalate-SF   Buffets II Flogesic Panagesic   Buffinol plain or Extra Strength Florinal or Florinal with Codeine Panwarfarin   Buf-Tabs Flurbiprofen Penicillamine   Butalbital Compound Four-way cold tablets Penicillin   Butazolidin Fragmin Pepto-Bismol   Carbenicillin Geminisyn Percodan   Carna Arthritis Reliever Geopen Persantine   Carprofen Gold's salt Persistin   Chloramphenicol Goody's Phenylbutazone   Chloromycetin Haltrain Piroxlcam   Clmetidine heparin Plaquenil   Cllnoril Hyco-pap Ponstel   Clofibrate Hydroxy chloroquine Propoxyphen         Before stopping any of these medications, be sure to consult the physician who ordered them.  Some, such as Coumadin (Warfarin) are ordered to prevent or treat serious conditions such as "deep thrombosis", "pumonary embolisms", and other heart problems.  The amount of time that you may need off of the medication may also vary with the medication and the reason for which you were taking it.  If you are taking any of these medications, please make sure you notify your pain physician before you undergo any procedures.           Moderate  Conscious Sedation, Adult Sedation is the use of medicines to help you relax and not feel pain. Moderate conscious sedation is a type of sedation that makes you less alert than normal. You are still able to respond to instructions, touch, or both. This type of sedation is used during short medical and dental procedures. It is milder than deep sedation, which is a type of sedation you cannot be easily woken up from. It is also milder than general anesthesia, which is the use of medicines to make you fall asleep. Moderate conscious sedation lets you return to your normal activities sooner. Tell a health care provider about: Any allergies you have. All medicines you are taking, including vitamins, herbs, steroids, eye drops, creams, and over-the-counter medicines. Any problems you or family members have had  with anesthesia. Any bleeding problems you have. Any surgeries you have had. Any medical conditions you have. Whether you are pregnant or may be pregnant. Any recent alcohol, tobacco, or drug use. What are the risks? Your health care provider will talk with you about risks. These may include: Oversedation. This is when you get too much medicine. Nausea or vomiting. Allergic reaction to medicines. Trouble breathing. If this happens, a breathing tube may be used. It will be removed when you can breathe better on your own. Heart trouble. Lung trouble. Emergence delirium. This is when you feel confused while the sedation wears off. This gets better with time. What happens before the procedure? When to stop eating and drinking Follow instructions from your health care provider about what you may eat and drink. These may include: 8 hours before your procedure Stop eating most foods. Do not eat meat, fried foods, or fatty foods. Eat only light foods, such as toast or crackers. All liquids are okay except energy drinks and alcohol. 6 hours before your procedure Stop eating. Drink only clear  liquids, such as water, clear fruit juice, black coffee, plain tea, and sports drinks. Do not drink energy drinks or alcohol. 2 hours before your procedure Stop drinking all liquids. You may be allowed to take medicines with small sips of water. If you do not follow your health care provider's instructions, your procedure may be delayed or canceled. Medicines Ask your health care provider about: Changing or stopping your regular medicines. These include any diabetes medicines or blood thinners you take. Taking medicines such as aspirin and ibuprofen. These medicines can thin your blood. Do not take them unless your health care provider tells you to. Taking over-the-counter medicines, vitamins, herbs, and supplements. Tests and exams You may have an exam or testing. You may have a blood or urine sample taken. General instructions Do not use any products that contain nicotine or tobacco for at least 4 weeks before the procedure. These products include cigarettes, chewing tobacco, and vaping devices, such as e-cigarettes. If you need help quitting, ask your health care provider. If you will be going home right after the procedure, plan to have a responsible adult: Take you home from the hospital or clinic. You will not be allowed to drive. Care for you for the time you are told. What happens during the procedure?  You will be given the sedative. It may be given: As a pill you can take by mouth. It can also be put into the rectum. As a spray through the nose. As an injection into muscle. As an injection into a vein through an IV. You may be given oxygen as needed. Your blood pressure, heart rate, breathing rate, and blood oxygen level will be monitored during the procedure. The medical or dental procedure will be done. The procedure may vary among health care providers and hospitals. What happens after the procedure? Your blood pressure, heart rate, breathing rate, and blood oxygen level  will be monitored until you leave the hospital or clinic. You will get fluids through an IV as needed. Do not drive or operate machinery until your health care provider says that it is safe. This information is not intended to replace advice given to you by your health care provider. Make sure you discuss any questions you have with your health care provider. Document Revised: 02/16/2022 Document Reviewed: 02/16/2022 Else[employer] Patient Education  [employer].   Epidural Steroid Injection Patient Information  Description: The epidural space  surrounds the nerves as they exit the spinal cord.  In some patients, the nerves can be compressed and inflamed by a bulging disc or a tight spinal canal (spinal stenosis).  By injecting steroids into the epidural space, we can bring irritated nerves into direct contact with a potentially helpful medication.  These steroids act directly on the irritated nerves and can reduce swelling and inflammation which often leads to decreased pain.  Epidural steroids may be injected anywhere along the spine and from the neck to the low back depending upon the location of your pain.   After numbing the skin with local anesthetic (like Novocaine), a small needle is passed into the epidural space slowly.  You may experience a sensation of pressure while this is being done.  The entire block usually last less than 10 minutes.  Conditions which may be treated by epidural steroids:  Low back and leg pain Neck and arm pain Spinal stenosis Post-laminectomy syndrome Herpes zoster (shingles) pain Pain from compression fractures  Preparation for the injection:  Do not eat any solid food or dairy products within 8 hours of your appointment.  You may drink clear liquids up to 3 hours before appointment.  Clear liquids include water, black coffee, juice or soda.  No milk or cream please. You may take your regular medication, including pain medications, with a sip of water  before your appointment  Diabetics should hold regular insulin (if taken separately) and take 1/2 normal NPH dos the morning of the procedure.  Carry some sugar containing items with you to your appointment. A driver must accompany you and be prepared to drive you home after your procedure.  Bring all your current medications with your. An IV may be inserted and sedation may be given at the discretion of the physician.   A blood pressure cuff, EKG and other monitors will often be applied during the procedure.  Some patients may need to have extra oxygen administered for a short period. You will be asked to provide medical information, including your allergies, prior to the procedure.  We must know immediately if you are taking blood thinners (like Coumadin/Warfarin)  Or if you are allergic to IV iodine contrast (dye). We must know if you could possible be pregnant.  Possible side-effects: Bleeding from needle site Infection (rare, may require surgery) Nerve injury (rare) Numbness & tingling (temporary) Difficulty urinating (rare, temporary) Spinal headache ( a headache worse with upright posture) Light -headedness (temporary) Pain at injection site (several days) Decreased blood pressure (temporary) Weakness in arm/leg (temporary) Pressure sensation in back/neck (temporary)  Call if you experience: Fever/chills associated with headache or increased back/neck pain. Headache worsened by an upright position. New onset weakness or numbness of an extremity below the injection site Hives or difficulty breathing (go to the emergency room) Inflammation or drainage at the infection site Severe back/neck pain Any new symptoms which are concerning to you  Please note:  Although the local anesthetic injected can often make your back or neck feel good for several hours after the injection, the pain will likely return.  It takes 3-7 days for steroids to work in the epidural space.  You may not  notice any pain relief for at least that one week.  If effective, we will often do a series of three injections spaced 3-6 weeks apart to maximally decrease your pain.  After the initial series, we generally will wait several months before considering a repeat injection of the same type.  If you have any questions, please call [phone] [facility]

## 2022-11-10 NOTE — Progress Notes (Signed)
Safety precautions to be maintained throughout the outpatient stay will include: orient to surroundings, keep bed in low position, maintain call bell within reach at all times, provide assistance with transfer out of bed and ambulation.  

## 2022-11-11 ENCOUNTER — Other Ambulatory Visit: Payer: Self-pay | Admitting: Psychiatry

## 2022-11-11 ENCOUNTER — Other Ambulatory Visit: Payer: Self-pay | Admitting: Cardiovascular Disease

## 2022-11-11 DIAGNOSIS — I10 Essential (primary) hypertension: Secondary | ICD-10-CM

## 2022-11-11 DIAGNOSIS — I471 Supraventricular tachycardia, unspecified: Secondary | ICD-10-CM

## 2022-11-11 DIAGNOSIS — F3342 Major depressive disorder, recurrent, in full remission: Secondary | ICD-10-CM

## 2022-11-12 ENCOUNTER — Encounter: Payer: Medicare Other | Admitting: *Deleted

## 2022-11-12 ENCOUNTER — Telehealth: Payer: Self-pay

## 2022-11-12 DIAGNOSIS — J449 Chronic obstructive pulmonary disease, unspecified: Secondary | ICD-10-CM

## 2022-11-12 NOTE — Progress Notes (Signed)
Care Management & Coordination Services Pharmacy Team Pharmacy Assistant   Name: Gabrielle Pineda  MRN: [id number] DOB: 1956/08/16  Reason for Encounter: Medication Coordination and Delivery for [facility]/Schedule follow-up  Contacted patient to discuss medications and coordinate delivery from [facility]. Spoke with patient on 11/12/2022   Chart review: Recent office visits:  11/09/2022 Laqueta Jean, LPN (PCP Clinical Support Visit) for Medicare Wellness Exam- No medication changes noted, No orders placed  Recent consult visits:  11/10/2022 [name], MD (Pain Management) for Follow-up- No medication changes noted, Lumbar Transforaminal Epidural order placed, BP: 144/77, Patient to follow-up in 27 days  11/05/2022 Gardiner Barefoot, DPM (Podiatry) for Nail Problem- No medication changes noted, No orders placed, patient to follow-up in 3 months  11/05/2022 Darel Hong, RN (Pulmonary Rehab) for Follow-up- No medication changes noted, No orders placed  11/03/2022 Heath Lark, RN (Pulmonary Rehab) for Follow-up- No medication changes noted, No orders placed  10/29/2022 Darlyne Russian, RN (Pulmonary Rehab) for Follow-up- No medication changes noted, No orders placed  10/27/2022 Heath Lark, RN (Pulmonary Rehab) for Follow-up- No medication changes noted, No orders placed  10/26/2022 Starr Sinclair [place], PA (Rheumatology) for Rheumatoid Arthritis)- No medication changes noted, No orders placed, Patient to follow-up in 3 months  10/22/2022 Darlyne Russian, RN (Pulmonary Rehab) for Follow-up- No medication changes noted, No orders placed  Hospital visits:  None in previous 6 months  Medications: Outpatient Encounter Medications as of 11/12/2022  Medication Sig   acyclovir ointment (ZOVIRAX) 5 % :Topical Every 3 Hours as needed for lesion   albuterol (PROVENTIL) (2.5 MG/3ML) 0.083% nebulizer solution Take 3 mLs (2.5 mg total) by nebulization every 4 (four) hours as  needed for wheezing or shortness of breath.   albuterol (VENTOLIN HFA) 108 (90 Base) MCG/ACT inhaler INHALE TWO PUFFS BY MOUTH INTO LUNGS every SIX hours AS NEEDED FOR WHEEZING AND/OR SHORTNESS OF BREATH   ARIPiprazole (ABILIFY) 5 MG tablet TAKE ONE TABLET BY MOUTH EVERY MORNING   atorvastatin (LIPITOR) 40 MG tablet TAKE ONE TABLET BY MOUTH EVERY MORNING   Budeson-Glycopyrrol-Formoterol (BREZTRI AEROSPHERE) 160-9-4.8 MCG/ACT AERO Inhale 2 puffs into the lungs in the morning and at bedtime.   carvedilol (COREG) 12.5 MG tablet TAKE ONE TABLET BY MOUTH EVERY MORNING and TAKE ONE TABLET BY MOUTH EVERYDAY AT BEDTIME   cetirizine (ZYRTEC) 10 MG tablet Take 10 mg by mouth at bedtime.   dextromethorphan-guaiFENesin (MUCINEX DM) 30-600 MG 12hr tablet Take 1 tablet by mouth 2 (two) times daily.   ezetimibe (ZETIA) 10 MG tablet TAKE ONE TABLET BY MOUTH EVERY MORNING   gabapentin (NEURONTIN) 400 MG capsule Take 1 capsule (400 mg total) by mouth 2 (two) times daily.   leflunomide (ARAVA) 20 MG tablet Take 20 mg by mouth at bedtime.   LINZESS 145 MCG CAPS capsule Take 145 mcg by mouth daily before breakfast.   meloxicam (MOBIC) 15 MG tablet Take 15 mg by mouth at bedtime.   mirtazapine (REMERON) 15 MG tablet TAKE ONE TABLET BY MOUTH EVERYDAY AT BEDTIME   montelukast (SINGULAIR) 10 MG tablet TAKE ONE TABLET BY MOUTH EVERYDAY AT BEDTIME   Multiple Vitamin (MULTIVITAMIN WITH MINERALS) TABS tablet Take 1 tablet by mouth daily.   Nebulizer MISC Compressor nebulizer to use with nebulized medications as instructed   omeprazole (PRILOSEC) 40 MG capsule TAKE ONE CAPSULE BY MOUTH EVERY MORNING and TAKE ONE CAPSULE BY MOUTH EVERYDAY AT BEDTIME   oxyCODONE-acetaminophen (PERCOCET) 5-325 MG tablet Take 1 tablet by mouth every 8 (eight) hours  as needed for severe pain. Must last 30 days.   [START ON 11/29/2022] oxyCODONE-acetaminophen (PERCOCET) 5-325 MG tablet Take 1 tablet by mouth every 8 (eight) hours as needed for severe  pain. Must last 30 days.   [START ON 12/29/2022] oxyCODONE-acetaminophen (PERCOCET) 5-325 MG tablet Take 1 tablet by mouth every 8 (eight) hours as needed for severe pain. Must last 30 days.   Oxymetazoline HCl (MUCINEX NASAL SPRAY MOISTURE NA) Place into the nose.   potassium chloride SA (KLOR-CON M) 20 MEQ tablet Take 1 tablet (20 mEq total) by mouth daily with breakfast.   SPIKEVAX syringe    topiramate (TOPAMAX) 25 MG tablet Take 1 tablet (25 mg total) by mouth 2 (two) times daily.   valACYclovir (VALTREX) 1000 MG tablet TAKE TWO TABLETS BY MOUTH TWICE DAILY AS NEEDED FOR 5 DAYS FOR FEVER blisters   venlafaxine XR (EFFEXOR-XR) 75 MG 24 hr capsule TAKE THREE CAPSULES BY MOUTH EVERY MORNING   XIIDRA 5 % SOLN Place 1 drop into both eyes daily.   No facility-administered encounter medications on file as of 11/12/2022.   BP Readings from Last 3 Encounters:  11/10/22 (!) 144/77  11/05/22 (!) 144/75  10/06/22 (!) 146/83    Pulse Readings from Last 3 Encounters:  11/10/22 68  11/05/22 82  10/06/22 74    Lab Results  Component Value Date/Time   HGBA1C 5.3 06/26/2021 09:13 AM   HGBA1C 5.0 04/14/2018 11:24 AM   Lab Results  Component Value Date   CREATININE 1.00 09/11/2022   BUN 11 09/11/2022   GFRNONAA >60 09/11/2022   GFRAA 72 05/06/2020   NA 139 09/11/2022   K 4.1 09/11/2022   CALCIUM 8.8 (L) 09/11/2022   CO2 22 09/11/2022   Cycle dispensing form sent to Clarita Leber, [facility] for review.   Last adherence delivery date: 10/23/2022      Patient is due for next adherence delivery on: 11/24/2022  This delivery to include: Adherence Packaging  30 Days  Meloxicam 15 mg tablet- Take one tablet by mouth once daily (Bedtime) Ezetimibe (Zetia) 10 mg tablet Take one tablet by mouth daily (Breakfast) Atorvastatin 40 mg tablet- Take one tablet by mouth daily (Breakfast) Carvedilol 12.5 mg tablet - Take on tablet by mouth twice daily (Breakfast, Bedtime) Mirtazapine 15 mg tablet-Take one  tablet daily (Bedtime) Topiramate 25 mg tablet-Take one tablet by mouth twice daily (Breakfast, Bedtime) Gabapentin 400 mg tablet- Take one tablet by mouth twice daily (Breakfast, Bedtime) Venlafaxine ER 75 mg capsule- Take three capsules by mouth daily (Breakfast) Omeprazole 40 mg- Take one capsule by mouth two times a day (Breakfast, Bedtime) Potassium 20 mEq - Take one capsule daily (Breakfast) Aripiprazole 5 mg tablet- Take one tablet by mouth daily (Breakfast) Leflunomide 20 mg tablet- Take one tablet by mouth once daily (Bedtime) Montelukast (Singulair) 10 mg tablet 1 tablet daily (Bedtime)  Patient declined the following medications this month: No medication was declined  Refills requested from providers include: Ezetimibe 10 mg, Atorvastatin 40 mg, Carvedilol 12.5 mg, Aripiprazole 5 mg all specialist medications in which [facility] sent over request to providers for refills.  Confirmed delivery date of 11/24/2022 [facility], advised patient that pharmacy will contact them the morning of delivery.  Any concerns about your medications? No  How often do you forget or accidentally miss a dose? Never  Do you use a pillbox? No  Is patient in packaging Yes   Recent blood pressure readings are as follows: 144/77  Recent blood glucose readings  are as follows: N/A  Patient scheduled with CPP for 01/26/2023 @0900   Lynann Bologna, CPA/CMA Clinical Pharmacist Assistant Phone: [phone] '

## 2022-11-12 NOTE — Progress Notes (Signed)
Daily Session Note  Patient Details  Name: Gabrielle Pineda MRN: [id number] Date of Birth: 17-Jun-1956 Referring Provider:   Flowsheet Row Pulmonary Rehab from 07/27/2022 in [facility] Card[facility]  Referring Provider [name] MD       Encounter Date: 11/12/2022  Check In:  Session Check In - 11/12/22 1040       Check-In   Supervising physician immediately available to respond to emergencies See telemetry face sheet for immediately available ER MD    Location [facility]-Cardiac & Pulmonary Rehab    Staff Present Darlyne Russian, RN, Lorin Mercy, MS, ACSM CEP, Exercise Physiologist;Joseph Tessie Fass, [place]    Virtual Visit No    Medication changes reported     No    Fall or balance concerns reported    No    Warm-up and Cool-down Performed on first and last piece of equipment    Resistance Training Performed Yes    VAD Patient? No    PAD/SET Patient? No      Pain Assessment   Currently in Pain? No/denies                Social History   Tobacco Use  Smoking Status Former   Packs/day: 2.00   Years: 40.00   Additional pack years: 0.00   Total pack years: 80.00   Types: Cigarettes   Quit date: 10/30/2020   Years since quitting: 2.0  Smokeless Tobacco Never    Goals Met:  Independence with exercise equipment Exercise tolerated well No report of concerns or symptoms today Strength training completed today  Goals Unmet:  Not Applicable  Comments: Pt able to follow exercise prescription today without complaint.  Will continue to monitor for progression.    Dr. Emily Filbert is Medical Director for [facility].  Dr. Ottie Glazier is Medical Director for [facility] Pulmonary Rehabilitation.

## 2022-11-17 ENCOUNTER — Encounter: Payer: Medicare Other | Attending: Physician Assistant | Admitting: *Deleted

## 2022-11-17 DIAGNOSIS — J449 Chronic obstructive pulmonary disease, unspecified: Secondary | ICD-10-CM | POA: Diagnosis not present

## 2022-11-17 NOTE — Progress Notes (Signed)
Daily Session Note  Patient Details  Name: Gabrielle Pineda MRN: [id number] Date of Birth: 29-Apr-1956 Referring Provider:   Flowsheet Row Pulmonary Rehab from 07/27/2022 in [facility] Cardiac and Pulmonary Rehab  Referring Provider [name] MD       Encounter Date: 11/17/2022  Check In:  Session Check In - 11/17/22 0959       Check-In   Supervising physician immediately available to respond to emergencies See telemetry face sheet for immediately available ER MD    Location [facility]-Cardiac & Pulmonary Rehab    Staff Present Renita Papa, RN Abel Presto, MS, ACSM CEP, Exercise Physiologist;Melissa Tilford Pillar MS, RDN, LDN;Other   Gretchen Short BA, Exercise Science   Virtual Visit No    Medication changes reported     No    Fall or balance concerns reported    No    Warm-up and Cool-down Performed on first and last piece of equipment    Resistance Training Performed Yes    VAD Patient? No    PAD/SET Patient? No      Pain Assessment   Currently in Pain? No/denies                Social History   Tobacco Use  Smoking Status Former   Packs/day: 2.00   Years: 40.00   Additional pack years: 0.00   Total pack years: 80.00   Types: Cigarettes   Quit date: 10/30/2020   Years since quitting: 2.0  Smokeless Tobacco Never    Goals Met:  Independence with exercise equipment Exercise tolerated well No report of concerns or symptoms today Strength training completed today  Goals Unmet:  Not Applicable  Comments: Pt able to follow exercise prescription today without complaint.  Will continue to monitor for progression.    Dr. Emily Filbert is Medical Director for [place].  Dr. Ottie Glazier is Medical Director for [facility] Pulmonary Rehabilitation.

## 2022-11-19 ENCOUNTER — Encounter: Payer: Medicare Other | Admitting: *Deleted

## 2022-11-19 ENCOUNTER — Encounter: Payer: Medicare Other | Admitting: Physician Assistant

## 2022-11-23 ENCOUNTER — Encounter: Payer: Self-pay | Admitting: Physician Assistant

## 2022-11-23 ENCOUNTER — Ambulatory Visit (INDEPENDENT_AMBULATORY_CARE_PROVIDER_SITE_OTHER): Payer: Medicare Other | Admitting: Physician Assistant

## 2022-11-23 VITALS — BP 148/88 | HR 78 | Wt 175.1 lb

## 2022-11-23 DIAGNOSIS — I10 Essential (primary) hypertension: Secondary | ICD-10-CM | POA: Diagnosis not present

## 2022-11-23 DIAGNOSIS — R739 Hyperglycemia, unspecified: Secondary | ICD-10-CM | POA: Diagnosis not present

## 2022-11-23 DIAGNOSIS — E7800 Pure hypercholesterolemia, unspecified: Secondary | ICD-10-CM

## 2022-11-23 DIAGNOSIS — D539 Nutritional anemia, unspecified: Secondary | ICD-10-CM | POA: Diagnosis not present

## 2022-11-23 DIAGNOSIS — Z1231 Encounter for screening mammogram for malignant neoplasm of breast: Secondary | ICD-10-CM | POA: Diagnosis not present

## 2022-11-23 DIAGNOSIS — Z1211 Encounter for screening for malignant neoplasm of colon: Secondary | ICD-10-CM | POA: Diagnosis not present

## 2022-11-23 DIAGNOSIS — Z0000 Encounter for general adult medical examination without abnormal findings: Secondary | ICD-10-CM

## 2022-11-23 DIAGNOSIS — J301 Allergic rhinitis due to pollen: Secondary | ICD-10-CM | POA: Diagnosis not present

## 2022-11-23 DIAGNOSIS — R600 Localized edema: Secondary | ICD-10-CM

## 2022-11-23 MED ORDER — FUROSEMIDE 20 MG PO TABS
20.0000 mg | ORAL_TABLET | Freq: Every day | ORAL | 0 refills | Status: DC
Start: 2022-11-23 — End: 2023-04-14

## 2022-11-23 MED ORDER — LOSARTAN POTASSIUM 25 MG PO TABS
25.0000 mg | ORAL_TABLET | Freq: Every day | ORAL | 1 refills | Status: DC
Start: 2022-11-23 — End: 2023-04-08

## 2022-11-23 MED ORDER — IPRATROPIUM BROMIDE 0.03 % NA SOLN
2.0000 | Freq: Two times a day (BID) | NASAL | 12 refills | Status: DC
Start: 2022-11-23 — End: 2023-10-06

## 2022-11-23 NOTE — Progress Notes (Signed)
I,Gabrielle Pineda,acting as a Neurosurgeonscribe for Eastman KodakLindsay Jaunita Mikels, PA-C.,have documented all relevant documentation on the behalf of Alfredia FergusonLindsay Elesia Pemberton, PA-C,as directed by  Alfredia FergusonLindsay Fedor Kazmierski, PA-C while in the presence of Alfredia FergusonLindsay Sharonica Kraszewski, PA-C.   Complete physical exam   Patient: Gabrielle Pineda   DOB: 03-09-1956   67 y.o. Female  MRN: [id number] Visit Date: 11/23/2022  Today's healthcare provider: Alfredia FergusonLindsay Keely Drennan, PA-C   Cc. cpe  Subjective    Gabrielle Pineda is a 67 y.o. female who presents today for a complete physical exam.  She reports consuming a general diet.  Patient reports doing pulmonary cardiac rehab twice a week for 45 minutes.  She generally feels well. She reports sleeping well. She does not have additional problems to discuss today.  HPI   She has been taking lasix 20 mg twice - once a week for a long time. She is out and needs a refill. Reports having periods when she is not urinating completely, feels abdominal bloat, and notices swelling in her legs. This is when she takes the lasix.  Past Medical History:  Diagnosis Date   Alcohol abuse    Allergy 1983   Anemia    Anxiety    Arthritis    Cataract    Cervicalgia    Cirrhosis 1994   COPD (chronic obstructive pulmonary disease)    Depression    Difficult intubation    has plates and screws in neck   Dyspnea    GERD (gastroesophageal reflux disease)    Headache    Heart murmur    on heard when pt is lying   Hypertension    Neuromuscular disorder    Neuropathy   Other and unspecified hyperlipidemia    Oxygen deficiency    Status post insertion of spinal cord stimulator    Tachycardia    d/t questionable anxiety happens every 5- 10 years, sees [facility]   Past Surgical History:  Procedure Laterality Date   ANTERIOR CERVICAL DECOMP/DISCECTOMY FUSION  2012, 2015, 2018   x3   AUGMENTATION MAMMAPLASTY Bilateral 1982   BREAST ENHANCEMENT SURGERY  1981   CARPAL TUNNEL RELEASE  11/11/2011   Procedure:  CARPAL TUNNEL RELEASE;  Surgeon: Karn CassisErnesto M Botero, MD;  Location: [facility];  Service: Neurosurgery;  Laterality: Right;  Right Median Nerve Decompression   CARPAL TUNNEL RELEASE  2013   COLONOSCOPY WITH PROPOFOL N/A 11/01/2017   Procedure: COLONOSCOPY WITH PROPOFOL;  Surgeon: Scot JunElliott, Robert T, MD;  [facility];  Service: Endoscopy;  Laterality: N/A;   ESOPHAGOGASTRODUODENOSCOPY     JOINT REPLACEMENT  2022   LUMBAR LAMINECTOMY/DECOMPRESSION MICRODISCECTOMY N/A 08/15/2019   Procedure: Lumbar three to Sacral one Decompressive lumbar laminectomy;  Surgeon: Maeola HarmanStern, Joseph, MD;  [facility];  Service: Neurosurgery;  Laterality: N/A;   neck disc surgery     plates and screws in neck x 2   ROTATOR CUFF REPAIR  06/16/2016   right shoulder    ROTATOR CUFF REPAIR     SPINAL CORD STIMULATOR INSERTION N/A 03/17/2021   Procedure: THORACIC SPINAL CORD STIMULATOR (PERCUTANEOUS) & PULSE GENERATOR PLACEMENT;  Surgeon: Lucy Chrisook, Steven, MD;  Location: [facility] ORS;  Service: Neurosurgery;  Laterality: N/A;   SPINE SURGERY  2020   TONSILLECTOMY AND ADENOIDECTOMY  1973   TOTAL HIP ARTHROPLASTY Right 04/15/2021   Procedure: TOTAL HIP ARTHROPLASTY ANTERIOR APPROACH;  Surgeon: Kennedy BuckerMenz, Michael, MD;  Location: [facility] ORS;  Service: Orthopedics;  Laterality: Right;   Social History   Socioeconomic History  Marital status: Married    Spouse name: jerry   Number of children: 0   Years of education: Not on file   Highest education level: Bachelor's degree (e.g., BA, AB, BS)  Occupational History   Not on file  Tobacco Use   Smoking status: Former    Packs/day: 2.00    Years: 45.00    Additional pack years: 0.00    Total pack years: 90.00    Types: Cigarettes    Quit date: 10/28/2020    Years since quitting: 2.0   Smokeless tobacco: Never   Tobacco comments:    [name] been quit for about a year now.  Vaping Use   Vaping Use: Former   Devices: vape occasionally  Substance and Sexual Activity    Alcohol use: No    Comment: recovering alcoholic Since 1994    Drug use: Never   Sexual activity: Not Currently    Birth control/protection: Abstinence  Other Topics Concern   Not on file  Social History Narrative   Married; full time; does not get regular exercise.    Social Determinants of Health   Financial Resource Strain: Low Risk  (11/09/2022)   Overall Financial Resource Strain (CARDIA)    Difficulty of Paying Living Expenses: Not hard at all  Food Insecurity: No Food Insecurity (11/09/2022)   Hunger Vital Sign    Worried About Running Out of Food in the Last Year: Never true    Ran Out of Food in the Last Year: Never true  Transportation Needs: No Transportation Needs (11/09/2022)   PRAPARE - Administrator, Civil Service (Medical): No    Lack of Transportation (Non-Medical): No  Physical Activity: Insufficiently Active (11/09/2022)   Exercise Vital Sign    Days of Exercise per Week: 3 days    Minutes of Exercise per Session: 30 min  Stress: No Stress Concern Present (11/09/2022)   [employer] of Occupational Health - Occupational Stress Questionnaire    Feeling of Stress : Not at all  Social Connections: Moderately Isolated (11/09/2022)   Social Connection and Isolation Panel [NHANES]    Frequency of Communication with Friends and Family: More than three times a week    Frequency of Social Gatherings with Friends and Family: More than three times a week    Attends Religious Services: Never    Database administrator or Organizations: No    Attends Banker Meetings: Never    Marital Status: Married  Catering manager Violence: Not At Risk (11/09/2022)   Humiliation, Afraid, Rape, and Kick questionnaire    Fear of Current or Ex-Partner: No    Emotionally Abused: No    Physically Abused: No    Sexually Abused: No   Family Status  Relation Name Status   Mother [place] Marlette Zufall Deceased at age 25       suicide, drug OD    Father Idamae Schuller Deceased at age 41   Sister Deborah Zufall Deceased   Sister  Alive   Sister  Alive   Sister Dierdre Forth Zufall (Not Specified)   Neg Hx  (Not Specified)   Family History  Problem Relation Age of Onset   Alcohol abuse Mother    Bipolar disorder Mother    Suicidality Mother    Arthritis Mother    Early death Mother    Pancreatic cancer Father    Alcohol abuse Father    Cancer Father    Drug abuse Sister  Alcohol abuse Sister    Depression Sister    Alcohol abuse Sister    Anxiety disorder Sister    Anesthesia problems Neg Hx    Breast cancer Neg Hx    Allergies  Allergen Reactions   Sulfasalazine Other (See Comments)   Plaquenil [Hydroxychloroquine] Rash    Patient Care Team: [name], PA-C as PCP - General (Physician Assistant) Antonieta Iba, MD as PCP - Cardiology (Cardiology) Gaspar Cola, [facility] (Pharmacist) Jomarie Longs, MD as Consulting Physician (Psychiatry) Edward Jolly, MD as Consulting Physician (Pain Medicine)   Medications: Outpatient Medications Prior to Visit  Medication Sig   acyclovir ointment (ZOVIRAX) 5 % :Topical Every 3 Hours as needed for lesion   albuterol (PROVENTIL) (2.5 MG/3ML) 0.083% nebulizer solution Take 3 mLs (2.5 mg total) by nebulization every 4 (four) hours as needed for wheezing or shortness of breath.   albuterol (VENTOLIN HFA) 108 (90 Base) MCG/ACT inhaler INHALE TWO PUFFS BY MOUTH INTO LUNGS every SIX hours AS NEEDED FOR WHEEZING AND/OR SHORTNESS OF BREATH   ARIPiprazole (ABILIFY) 5 MG tablet TAKE ONE TABLET BY MOUTH EVERY MORNING   atorvastatin (LIPITOR) 40 MG tablet TAKE ONE TABLET BY MOUTH EVERY MORNING   Budeson-Glycopyrrol-Formoterol (BREZTRI AEROSPHERE) 160-9-4.8 MCG/ACT AERO Inhale 2 puffs into the lungs in the morning and at bedtime.   carvedilol (COREG) 12.5 MG tablet TAKE ONE TABLET BY MOUTH EVERY MORNING and TAKE ONE TABLET BY MOUTH EVERYDAY AT BEDTIME   dextromethorphan-guaiFENesin  (MUCINEX DM) 30-600 MG 12hr tablet Take 1 tablet by mouth 2 (two) times daily.   ezetimibe (ZETIA) 10 MG tablet TAKE ONE TABLET BY MOUTH EVERY MORNING   fexofenadine (ALLEGRA) 180 MG tablet Take 180 mg by mouth daily.   gabapentin (NEURONTIN) 400 MG capsule Take 1 capsule (400 mg total) by mouth 2 (two) times daily.   leflunomide (ARAVA) 20 MG tablet Take 20 mg by mouth at bedtime.   LINZESS 145 MCG CAPS capsule Take 145 mcg by mouth daily before breakfast.   meloxicam (MOBIC) 15 MG tablet Take 15 mg by mouth at bedtime.   mirtazapine (REMERON) 15 MG tablet TAKE ONE TABLET BY MOUTH EVERYDAY AT BEDTIME   montelukast (SINGULAIR) 10 MG tablet TAKE ONE TABLET BY MOUTH EVERYDAY AT BEDTIME   Multiple Vitamin (MULTIVITAMIN WITH MINERALS) TABS tablet Take 1 tablet by mouth daily.   Nebulizer MISC Compressor nebulizer to use with nebulized medications as instructed   omeprazole (PRILOSEC) 40 MG capsule TAKE ONE CAPSULE BY MOUTH EVERY MORNING and TAKE ONE CAPSULE BY MOUTH EVERYDAY AT BEDTIME   oxyCODONE-acetaminophen (PERCOCET) 5-325 MG tablet Take 1 tablet by mouth every 8 (eight) hours as needed for severe pain. Must last 30 days.   [START ON 11/29/2022] oxyCODONE-acetaminophen (PERCOCET) 5-325 MG tablet Take 1 tablet by mouth every 8 (eight) hours as needed for severe pain. Must last 30 days.   [START ON 12/29/2022] oxyCODONE-acetaminophen (PERCOCET) 5-325 MG tablet Take 1 tablet by mouth every 8 (eight) hours as needed for severe pain. Must last 30 days.   Oxymetazoline HCl (MUCINEX NASAL SPRAY MOISTURE NA) Place into the nose.   potassium chloride SA (KLOR-CON M) 20 MEQ tablet Take 1 tablet (20 mEq total) by mouth daily with breakfast.   SPIKEVAX syringe    topiramate (TOPAMAX) 25 MG tablet Take 1 tablet (25 mg total) by mouth 2 (two) times daily.   valACYclovir (VALTREX) 1000 MG tablet TAKE TWO TABLETS BY MOUTH TWICE DAILY AS NEEDED FOR 5 DAYS FOR FEVER blisters  venlafaxine XR (EFFEXOR-XR) 75 MG 24  hr capsule TAKE THREE CAPSULES BY MOUTH EVERY MORNING   XIIDRA 5 % SOLN Place 1 drop into both eyes daily.   [DISCONTINUED] cetirizine (ZYRTEC) 10 MG tablet Take 10 mg by mouth at bedtime.   No facility-administered medications prior to visit.    Review of Systems  HENT:  Positive for congestion, postnasal drip, rhinorrhea and sinus pressure.   Endocrine: Positive for polyuria.  Genitourinary:  Positive for enuresis and urgency.  Musculoskeletal:  Positive for arthralgias, back pain, neck pain and neck stiffness.  Allergic/Immunologic: Positive for environmental allergies.  Neurological:  Positive for headaches.    Objective    BP (!) 148/88 (BP Location: Right Arm, Patient Position: Sitting, Cuff Size: Large)   Pulse 78   Wt 175 lb 1.6 oz (79.4 kg)   SpO2 96%   BMI 25.86 kg/m   Physical Exam Constitutional:      General: She is awake.     Appearance: She is well-developed. She is not ill-appearing.  HENT:     Head: Normocephalic.     Right Ear: Tympanic membrane normal.     Left Ear: Tympanic membrane normal.     Nose: Nose normal. No congestion or rhinorrhea.     Mouth/Throat:     Pharynx: No oropharyngeal exudate or posterior oropharyngeal erythema.  Eyes:     Conjunctiva/sclera: Conjunctivae normal.     Pupils: Pupils are equal, round, and reactive to light.  Cardiovascular:     Rate and Rhythm: Normal rate and regular rhythm.     Heart sounds: Normal heart sounds.  Pulmonary:     Effort: Pulmonary effort is normal.     Breath sounds: Normal breath sounds.  Abdominal:     Palpations: Abdomen is soft.     Tenderness: There is no abdominal tenderness.  Musculoskeletal:     Right lower leg: No swelling. No edema.     Left lower leg: No swelling. No edema.  Skin:    General: Skin is warm.  Neurological:     Mental Status: She is alert and oriented to person, place, and time.  Psychiatric:        Attention and Perception: Attention normal.        Mood and  Affect: Mood normal.        Speech: Speech normal.        Behavior: Behavior normal. Behavior is cooperative.     Last depression screening scores    11/23/2022   10:02 AM 11/09/2022    2:54 PM 08/13/2022    9:40 AM  PHQ 2/9 Scores  PHQ - 2 Score 0 0   PHQ- 9 Score 0       Information is confidential and restricted. Go to Review Flowsheets to unlock data.   Last fall risk screening    11/09/2022    2:52 PM  Fall Risk   Falls in the past year? 0  Number falls in past yr: 0  Injury with Fall? 0  Risk for fall due to : No Fall Risks  Follow up Education provided;Falls prevention discussed   Last Audit-C alcohol use screening    11/23/2022   10:01 AM  Alcohol Use Disorder Test (AUDIT)  1. How often do you have a drink containing alcohol? 0  2. How many drinks containing alcohol do you have on a typical day when you are drinking? 0  3. How often do you have six or more drinks on one  occasion? 0  AUDIT-C Score 0   A score of 3 or more in women, and 4 or more in men indicates increased risk for alcohol abuse, EXCEPT if all of the points are from question 1   No results found for any visits on 11/23/22.  Assessment & Plan    Routine Health Maintenance and Physical Exam  Exercise Activities and Dietary recommendations --balanced diet high in fiber and protein, low in sugars, carbs, fats. --physical activity/exercise 30 minutes 3-5 times a week     Immunization History  Administered Date(s) Administered   Fluad Quad(high Dose 65+) 10/01/2021, 05/14/2022   Influenza Split 09/07/2012   Influenza,inj,Quad PF,6+ Mos 11/18/2015, 07/23/2016, 07/02/2017, 04/14/2018, 04/17/2019, 04/19/2020   Influenza-Unspecified 09/24/2014   PFIZER(Purple Top)SARS-COV-2 Vaccination 11/21/2019, 12/12/2019   PNEUMOCOCCAL CONJUGATE-20 11/17/2021   Pneumococcal Polysaccharide-23 02/12/2011, 09/24/2014, 04/19/2020   Tdap 02/12/2011, 11/15/2015    Health Maintenance  Topic Date Due   Zoster  Vaccines- Shingrix (1 of 2) Never done   COVID-19 Vaccine (3 - Pfizer risk series) 01/09/2020   COLONOSCOPY (Pts 45-48yrs Insurance coverage will need to be confirmed)  11/02/2022   MAMMOGRAM  12/24/2022   INFLUENZA VACCINE  03/18/2023   Lung Cancer Screening  06/30/2023   Medicare Annual Wellness (AWV)  11/09/2023   DTaP/Tdap/Td (3 - Td or Tdap) 11/14/2025   Pneumonia Vaccine 68+ Years old  Completed   DEXA SCAN  Completed   Hepatitis C Screening  Completed   HPV VACCINES  Aged Out    Discussed health benefits of physical activity, and encouraged her to engage in regular exercise appropriate for her age and condition.  Problem List Items Addressed This Visit       Cardiovascular and Mediastinum   Essential hypertension    Elevated in office, on review, has been elevated consistently. Adding losartan 25 mg today.  F/u 4-6 weeks       Relevant Medications   losartan (COZAAR) 25 MG tablet   furosemide (LASIX) 20 MG tablet   Other Relevant Orders   Ambulatory referral to Cardiology     Respiratory   Allergic rhinitis    Causing headaches She takes Singulair Adding ipatropium nasal spray      Relevant Medications   ipratropium (ATROVENT) 0.03 % nasal spray     Other   Macrocytic anemia (Chronic)    Will repeat cbc      Relevant Orders   CBC w/Diff/Platelet   Hypercholesteremia    Managed with zetia 10, lipitor 40  Repeat fasting lipids       Relevant Medications   losartan (COZAAR) 25 MG tablet   furosemide (LASIX) 20 MG tablet   Other Relevant Orders   Lipid Profile   Ambulatory referral to Cardiology   Localized edema    Given edema, bloating, even though she appears euvolemic today, needs CHF w/u  Referring back to cardio Refilled lasix, okay with 1-2 week dosing based on volume overload features we discussed      Relevant Medications   furosemide (LASIX) 20 MG tablet   Other Relevant Orders   Ambulatory referral to Cardiology   Other Visit  Diagnoses     Annual physical exam    -  Primary   Hyperglycemia       Relevant Orders   HgB A1c   Breast cancer screening by mammogram       Relevant Orders   MM 3D SCREENING MAMMOGRAM BILATERAL BREAST   Colon cancer screening  Relevant Orders   Ambulatory referral to Gastroenterology       Return in about 6 weeks (around 01/04/2023) for hypertension.     I, [name], PA-C have reviewed all documentation for this visit. The documentation on 11/23/22  for the exam, diagnosis, procedures, and orders are all accurate and complete.  [name], PA-C [facility] [address] [place], [place], [address] Office: [phone] Fax: [phone]   [facility]

## 2022-11-23 NOTE — Assessment & Plan Note (Signed)
Elevated in office, on review, has been elevated consistently. Adding losartan 25 mg today.  F/u 4-6 weeks

## 2022-11-23 NOTE — Assessment & Plan Note (Signed)
Managed with zetia 10, lipitor 40  Repeat fasting lipids

## 2022-11-23 NOTE — Assessment & Plan Note (Signed)
Will repeat cbc 

## 2022-11-23 NOTE — Assessment & Plan Note (Signed)
Given edema, bloating, even though she appears euvolemic today, needs CHF w/u  Referring back to cardio Refilled lasix, okay with 1-2 week dosing based on volume overload features we discussed

## 2022-11-23 NOTE — Assessment & Plan Note (Signed)
Causing headaches She takes Singulair Adding ipatropium nasal spray

## 2022-11-24 ENCOUNTER — Encounter: Payer: Medicare Other | Admitting: *Deleted

## 2022-11-24 DIAGNOSIS — J449 Chronic obstructive pulmonary disease, unspecified: Secondary | ICD-10-CM

## 2022-11-24 LAB — CBC WITH DIFFERENTIAL/PLATELET
Basophils Absolute: 0 10*3/uL (ref 0.0–0.2)
Basos: 1 %
EOS (ABSOLUTE): 0 10*3/uL (ref 0.0–0.4)
Eos: 0 %
Hematocrit: 30.9 % — ABNORMAL LOW (ref 34.0–46.6)
Hemoglobin: 10.2 g/dL — ABNORMAL LOW (ref 11.1–15.9)
Immature Grans (Abs): 0 10*3/uL (ref 0.0–0.1)
Immature Granulocytes: 0 %
Lymphocytes Absolute: 1.5 10*3/uL (ref 0.7–3.1)
Lymphs: 17 %
MCH: 31.1 pg (ref 26.6–33.0)
MCHC: 33 g/dL (ref 31.5–35.7)
MCV: 94 fL (ref 79–97)
Monocytes Absolute: 0.9 10*3/uL (ref 0.1–0.9)
Monocytes: 10 %
Neutrophils Absolute: 6.3 10*3/uL (ref 1.4–7.0)
Neutrophils: 72 %
Platelets: 244 10*3/uL (ref 150–450)
RBC: 3.28 x10E6/uL — ABNORMAL LOW (ref 3.77–5.28)
RDW: 12.9 % (ref 11.7–15.4)
WBC: 8.7 10*3/uL (ref 3.4–10.8)

## 2022-11-24 LAB — HEMOGLOBIN A1C
Est. average glucose Bld gHb Est-mCnc: 105 mg/dL
Hgb A1c MFr Bld: 5.3 % (ref 4.8–5.6)

## 2022-11-24 LAB — LIPID PANEL
Chol/HDL Ratio: 2.9 ratio (ref 0.0–4.4)
Cholesterol, Total: 170 mg/dL (ref 100–199)
HDL: 59 mg/dL (ref >39)
LDL Chol Calc (NIH): 78 mg/dL (ref 0–99)
Triglycerides: 196 mg/dL — ABNORMAL HIGH (ref 0–149)
VLDL Cholesterol Cal: 33 mg/dL (ref 5–40)

## 2022-11-24 NOTE — Progress Notes (Signed)
Daily Session Note  Patient Details  Name: Gabrielle Pineda MRN: [id number] Date of Birth: 1955/09/28 Referring Provider:   Flowsheet Row Pulmonary Rehab from 07/27/2022 in [facility]  Referring Provider Sarina Ser MD       Encounter Date: 11/24/2022  Check In:  Session Check In - 11/24/22 1029       Check-In   Supervising physician immediately available to respond to emergencies See telemetry face sheet for immediately available ER MD    Location [facility]    Staff Present Sherrine Maples MS, RDN, LDN;Jessica Juanetta Gosling, MA, RCEP, CCRP, Zackery Barefoot, MS, ACSM CEP, Exercise Physiologist    Virtual Visit No    Medication changes reported     No    Fall or balance concerns reported    No    Tobacco Cessation No Change    Warm-up and Cool-down Performed on first and last piece of equipment    Resistance Training Performed Yes    VAD Patient? No    PAD/SET Patient? No      Pain Assessment   Currently in Pain? No/denies                Social History   Tobacco Use  Smoking Status Former   Packs/day: 2.00   Years: 45.00   Additional pack years: 0.00   Total pack years: 90.00   Types: Cigarettes   Quit date: 10/28/2020   Years since quitting: 2.0  Smokeless Tobacco Never  Tobacco Comments   Ive been quit for about a year now.    Goals Met:  Independence with exercise equipment Exercise tolerated well No report of concerns or symptoms today  Goals Unmet:  Not Applicable  Comments: Pt able to follow exercise prescription today without complaint.  Will continue to monitor for progression.    Dr. Bethann Punches is Medical Director for [facility].  Dr. Vida Rigger is Medical Director for [facility].

## 2022-11-25 ENCOUNTER — Ambulatory Visit: Payer: Medicare Other | Admitting: Pulmonary Disease

## 2022-11-25 ENCOUNTER — Encounter: Payer: Self-pay | Admitting: Pulmonary Disease

## 2022-11-25 ENCOUNTER — Other Ambulatory Visit
Admission: RE | Admit: 2022-11-25 | Discharge: 2022-11-25 | Disposition: A | Discharge status: Home / Self Care | Payer: Medicare Other | Source: Ambulatory Visit | Attending: Pulmonary Disease | Admitting: Pulmonary Disease

## 2022-11-25 VITALS — BP 124/80 | HR 78 | Temp 98.1°F | Ht 69.0 in | Wt 176.2 lb

## 2022-11-25 DIAGNOSIS — J449 Chronic obstructive pulmonary disease, unspecified: Secondary | ICD-10-CM

## 2022-11-25 DIAGNOSIS — Z87891 Personal history of nicotine dependence: Secondary | ICD-10-CM | POA: Diagnosis not present

## 2022-11-25 DIAGNOSIS — R918 Other nonspecific abnormal finding of lung field: Secondary | ICD-10-CM

## 2022-11-25 DIAGNOSIS — J4489 Other specified chronic obstructive pulmonary disease: Secondary | ICD-10-CM | POA: Insufficient documentation

## 2022-11-25 DIAGNOSIS — R062 Wheezing: Secondary | ICD-10-CM

## 2022-11-25 LAB — CBC WITH DIFFERENTIAL/PLATELET
Abs Immature Granulocytes: 0.02 10*3/uL (ref 0.00–0.07)
Basophils Absolute: 0 10*3/uL (ref 0.0–0.1)
Basophils Relative: 1 %
Eosinophils Absolute: 0.2 10*3/uL (ref 0.0–0.5)
Eosinophils Relative: 3 %
HCT: 31.2 % — ABNORMAL LOW (ref 36.0–46.0)
Hemoglobin: 9.9 g/dL — ABNORMAL LOW (ref 12.0–15.0)
Immature Granulocytes: 0 %
Lymphocytes Relative: 25 %
Lymphs Abs: 1.5 10*3/uL (ref 0.7–4.0)
MCH: 31.7 pg (ref 26.0–34.0)
MCHC: 31.7 g/dL (ref 30.0–36.0)
MCV: 100 fL (ref 80.0–100.0)
Monocytes Absolute: 0.8 10*3/uL (ref 0.1–1.0)
Monocytes Relative: 14 %
Neutro Abs: 3.4 10*3/uL (ref 1.7–7.7)
Neutrophils Relative %: 57 %
Platelets: 221 10*3/uL (ref 150–400)
RBC: 3.12 MIL/uL — ABNORMAL LOW (ref 3.87–5.11)
RDW: 13.2 % (ref 11.5–15.5)
WBC: 6 10*3/uL (ref 4.0–10.5)
nRBC: 0 % (ref 0.0–0.2)

## 2022-11-25 LAB — NITRIC OXIDE: Nitric Oxide: 22

## 2022-11-25 MED ORDER — METHYLPREDNISOLONE 4 MG PO TBPK
ORAL_TABLET | ORAL | 0 refills | Status: DC
Start: 2022-11-25 — End: 2023-03-02

## 2022-11-25 NOTE — Patient Instructions (Signed)
We have sent the medication into your pharmacy.  This is a prednisone like medication.  We will have some blood work drawn today.  We will see you in follow-up in 4 to 6 weeks time call sooner should any new problems arise.

## 2022-11-25 NOTE — Progress Notes (Signed)
Subjective:    Patient ID: Gabrielle Pineda, female    DOB: 02/23/1956, 67 y.o.   MRN: [id number] Patient Care Team: [name], PA-C as PCP - General (Physician Assistant) Antonieta Iba, MD as PCP - Cardiology (Cardiology) Gaspar Cola, [facility] (Pharmacist) Jomarie Longs, MD as Consulting Physician (Psychiatry) Edward Jolly, MD as Consulting Physician (Pain Medicine)  Chief Complaint  Patient presents with   Follow-up    Breathing is fine. No SOB, wheezing or cough.    HPI This is a 67 year old female, former smoker quit in 2022 (80 PY), initially seen for pulmonary consult December 11, 2021 for shortness of breath and COPD, abnormal CT chest with lung nodularity.  She has rheumatoid arthritis and is maintained on [place].  She presents today for follow-up.  Last seen here on 07 July 2022 by me.  This is a scheduled visit.  She quit smoking approximately a year ago.  She has maintained on Breztri for COPD issues.  She uses albuterol but usually requires it only once a day.  She states that her breathing is fine today however, on further evaluation it is noted that she has some increased shortness of breath from baseline related to "allergies".  She does note seasonal variation with her symptoms.  She has not had any fevers, chills or sweats.  No cough or sputum production.  She is on pulmonary rehab and feels that this is helping her.  She was referred to lung cancer screening program and will have her first and towards November.  She does not endorse any other symptomatology today.  DATA 07/29/2017 simple spirometry: Performed for disability determination FEV1 2.26 L or 79% predicted FVC 3.05 L or 86% predicted FEV1/FVC 74% 11/01/2020 [facility]): Normal left ventricle, LVEF over 55%, right ventricle normal in size with normal systolic function, mild tricuspid regurgitation, no pulmonary hypertension, no wall motion abnormalities 12/18/2021 CT high-res chest: No  evidence of fibrotic interstitial lung disease, moderate emphysema and diffuse bronchial wall thickening, mild tracheobronchomalacia, irregular nodules of the left upper lobe and dependent right lower lobe measuring 8 mm, nonspecific.  Fine centrilobular nodularity concentrated in the lung bases consistent with smoking-related respiratory bronchiolitis, coronary artery disease 02/04/2022 PFTs: FEV1 1.45 L or 49% predicted, FVC 2.24 L or 59% predicted, FEV1/FVC 65%, lung volumes normal with modest air trapping,moderately to severe diffusion defect.  Flow volume loop consistent with airway obstruction, no evidence of fixed or variable upper airway obstruction.  There is significant decline in lung function when compared to the 2018 simple spirometry treatments 02/13/2022 overnight oximetry: Show desaturations of less than 88% for over 2 hours patient qualified for nocturnal oxygen 06/29/2022 CT chest: Previously noted left upper lobe and posterior right lower lobe pulmonary nodules resolved.  Tiny 3 mm anterior upper lobe pulmonary nodule suggest follow-up CT chest in 12 to 18 months.  Moderate centrilobular emphysema with mild diffuse bronchial wall thickening compatible with COPD  Review of Systems A 10 point review of systems was performed and it is as noted above otherwise negative.  Patient Active Problem List   Diagnosis Date Noted   Localized edema 11/23/2022   MDD (major depressive disorder), recurrent, in full remission 03/31/2022   Macrocytic anemia 02/20/2022   Overweight (BMI 25.0-29.9) 02/20/2022   COPD (chronic obstructive pulmonary disease) 02/06/2022   Lung nodules 02/06/2022   Allergic rhinitis 02/06/2022   GAD (generalized anxiety disorder) 12/26/2021   Alcohol use disorder, moderate, in sustained remission 12/26/2021   Need for  vaccination against Streptococcus pneumoniae 11/17/2021   Sleep disorder 10/23/2021   Iron deficiency anemia 06/26/2021   Depression, major, single  episode, moderate 06/26/2021   Status post total hip replacement, right 04/15/2021   Chronic radicular lumbar pain 10/03/2020   Postlaminectomy syndrome, lumbar region 06/03/2020   Moderate episode of recurrent major depressive disorder 12/04/2019   Spondylosis of cervical region without myelopathy or radiculopathy 09/21/2019   DDD (degenerative disc disease), cervical 09/21/2019   Cervicalgia 09/21/2019   Cervical fusion syndrome 09/21/2019   Chronic pain syndrome 09/21/2019   Spinal stenosis, lumbar region, with neurogenic claudication 08/15/2019   History of adenomatous polyp of colon 11/04/2017   Cervical radiculopathy 08/02/2017   Neuropathy 08/02/2017   Essential hypertension 07/28/2017   Closed compression fracture of L5 lumbar vertebra 07/07/2017   Sacral insufficiency fracture with routine healing 07/07/2017   Family history of malignant neoplasm of pancreas 04/03/2015   Hypercholesteremia 04/03/2015   Disorder of iron metabolism 04/03/2015   Carpal tunnel syndrome 03/29/2015   Acid reflux 10/11/2014   Closed fracture of distal phalanx of thumb 08/15/2014   Arthritis, degenerative 01/30/2014   Arthritis or polyarthritis, rheumatoid 01/30/2014   Paroxysmal supraventricular tachycardia 09/22/2010   B-complex deficiency 09/09/2007   CN (constipation) 06/26/2007   Clinical depression 06/26/2007   Cold sore 06/26/2007   H/O alcohol abuse 06/26/2007   Cannot sleep 06/26/2007   Localized osteoarthrosis, hand 06/26/2007   Menopausal symptom 06/26/2007   Social History   Tobacco Use   Smoking status: Former    Packs/day: 2.00    Years: 45.00    Additional pack years: 0.00    Total pack years: 90.00    Types: Cigarettes    Quit date: 10/28/2020    Years since quitting: 2.0   Smokeless tobacco: Never   Tobacco comments:    Lavenia Atlasve been quit for about a year now.  Substance Use Topics   Alcohol use: No    Comment: recovering alcoholic Since 1994    Allergies  Allergen  Reactions   Sulfasalazine Other (See Comments)   Plaquenil [Hydroxychloroquine] Rash   Current Meds  Medication Sig   acyclovir ointment (ZOVIRAX) 5 % :Topical Every 3 Hours as needed for lesion   albuterol (PROVENTIL) (2.5 MG/3ML) 0.083% nebulizer solution Take 3 mLs (2.5 mg total) by nebulization every 4 (four) hours as needed for wheezing or shortness of breath.   albuterol (VENTOLIN HFA) 108 (90 Base) MCG/ACT inhaler INHALE TWO PUFFS BY MOUTH INTO LUNGS every SIX hours AS NEEDED FOR WHEEZING AND/OR SHORTNESS OF BREATH   ARIPiprazole (ABILIFY) 5 MG tablet TAKE ONE TABLET BY MOUTH EVERY MORNING   atorvastatin (LIPITOR) 40 MG tablet TAKE ONE TABLET BY MOUTH EVERY MORNING   Budeson-Glycopyrrol-Formoterol (BREZTRI AEROSPHERE) 160-9-4.8 MCG/ACT AERO Inhale 2 puffs into the lungs in the morning and at bedtime.   carvedilol (COREG) 12.5 MG tablet TAKE ONE TABLET BY MOUTH EVERY MORNING and TAKE ONE TABLET BY MOUTH EVERYDAY AT BEDTIME   dextromethorphan-guaiFENesin (MUCINEX DM) 30-600 MG 12hr tablet Take 1 tablet by mouth 2 (two) times daily.   ezetimibe (ZETIA) 10 MG tablet TAKE ONE TABLET BY MOUTH EVERY MORNING   fexofenadine (ALLEGRA) 180 MG tablet Take 180 mg by mouth daily.   furosemide (LASIX) 20 MG tablet Take 1 tablet (20 mg total) by mouth daily.   gabapentin (NEURONTIN) 400 MG capsule Take 1 capsule (400 mg total) by mouth 2 (two) times daily.   ipratropium (ATROVENT) 0.03 % nasal spray Place 2 sprays into  both nostrils every 12 (twelve) hours.   leflunomide (ARAVA) 20 MG tablet Take 20 mg by mouth at bedtime.   LINZESS 145 MCG CAPS capsule Take 145 mcg by mouth daily before breakfast.   losartan (COZAAR) 25 MG tablet Take 1 tablet (25 mg total) by mouth daily.   meloxicam (MOBIC) 15 MG tablet Take 15 mg by mouth at bedtime.   mirtazapine (REMERON) 15 MG tablet TAKE ONE TABLET BY MOUTH EVERYDAY AT BEDTIME   montelukast (SINGULAIR) 10 MG tablet TAKE ONE TABLET BY MOUTH EVERYDAY AT BEDTIME    Multiple Vitamin (MULTIVITAMIN WITH MINERALS) TABS tablet Take 1 tablet by mouth daily.   Nebulizer MISC Compressor nebulizer to use with nebulized medications as instructed   omeprazole (PRILOSEC) 40 MG capsule TAKE ONE CAPSULE BY MOUTH EVERY MORNING and TAKE ONE CAPSULE BY MOUTH EVERYDAY AT BEDTIME   oxyCODONE-acetaminophen (PERCOCET) 5-325 MG tablet Take 1 tablet by mouth every 8 (eight) hours as needed for severe pain. Must last 30 days.   [START ON 11/29/2022] oxyCODONE-acetaminophen (PERCOCET) 5-325 MG tablet Take 1 tablet by mouth every 8 (eight) hours as needed for severe pain. Must last 30 days.   [START ON 12/29/2022] oxyCODONE-acetaminophen (PERCOCET) 5-325 MG tablet Take 1 tablet by mouth every 8 (eight) hours as needed for severe pain. Must last 30 days.   Oxymetazoline HCl (MUCINEX NASAL SPRAY MOISTURE NA) Place into the nose.   potassium chloride SA (KLOR-CON M) 20 MEQ tablet Take 1 tablet (20 mEq total) by mouth daily with breakfast.   SPIKEVAX syringe    topiramate (TOPAMAX) 25 MG tablet Take 1 tablet (25 mg total) by mouth 2 (two) times daily.   valACYclovir (VALTREX) 1000 MG tablet TAKE TWO TABLETS BY MOUTH TWICE DAILY AS NEEDED FOR 5 DAYS FOR FEVER blisters   venlafaxine XR (EFFEXOR-XR) 75 MG 24 hr capsule TAKE THREE CAPSULES BY MOUTH EVERY MORNING   XIIDRA 5 % SOLN Place 1 drop into both eyes daily.   Immunization History  Administered Date(s) Administered   Fluad Quad(high Dose 65+) 10/01/2021, 05/14/2022   Influenza Split 09/07/2012   Influenza,inj,Quad PF,6+ Mos 11/18/2015, 07/23/2016, 07/02/2017, 04/14/2018, 04/17/2019, 04/19/2020   Influenza-Unspecified 09/24/2014   PFIZER(Purple Top)SARS-COV-2 Vaccination 11/21/2019, 12/12/2019   PNEUMOCOCCAL CONJUGATE-20 11/17/2021   Pneumococcal Polysaccharide-23 02/12/2011, 09/24/2014, 04/19/2020   Tdap 02/12/2011, 11/15/2015        Objective:   Physical Exam BP 124/80 (BP Location: Left Arm, Cuff Size: Normal)   Pulse 78    Temp 98.1 F (36.7 C)   Ht 5\' 9"  (1.753 m)   Wt 176 lb 3.2 oz (79.9 kg)   SpO2 97%   BMI 26.02 kg/m   SpO2: 97 % O2 Device: None (Room air)  GENERAL: Well-developed, overweight woman, no acute distress.  Uses accessories of respiration appears to be on chronic basis.  Fully ambulatory.  No conversational dyspnea. HEAD: Normocephalic, atraumatic.  EYES: Pupils equal, round, reactive to light.  No scleral icterus.  MOUTH: Nose/mouth/throat not examined due to masking requirements for COVID 19. NECK: Supple. No thyromegaly. Trachea midline. No JVD.  No adenopathy. PULMONARY: Good air entry bilaterally.  She has scattered wheezing throughout. CARDIOVASCULAR: S1 and S2. Regular rate and rhythm.  ABDOMEN: Benign. MUSCULOSKELETAL: Stigmata of rheumatoid arthritis on both hands, no clubbing, no edema.  NEUROLOGIC: No overt focal deficit.  No gait disturbance.  Speech is fluent. SKIN: Intact,warm,dry. PSYCH: Mood anxious, behavior normal.   Lab Results  Component Value Date   NITRICOXIDE 22 11/25/2022  Assessment & Plan:     ICD-10-CM   1. Stage 3 severe COPD by GOLD classification  J44.9    Continue Breztri 2 puffs twice a day Continue as needed albuterol    2. Asthma-COPD overlap syndrome  J44.89 Allergen Panel (27) + IGE    CBC with Differential/Platelet   Check allergen panel, CBC with differential She has mild exacerbation Medrol Dosepak    3. Wheezing  R06.2 Nitric oxide   Nitric oxide without evidence of type II inflammation    4. Lung nodules  R91.8    Will be enrolled in lung cancer screening    5. Former heavy cigarette smoker (20-39 per day)  Z87.891    No evidence of relapse     Orders Placed This Encounter  Procedures   Allergen Panel (27) + IGE    Standing Status:   Future    Standing Expiration Date:   05/27/2023   CBC with Differential/Platelet    Standing Status:   Future    Standing Expiration Date:   11/25/2023   Nitric oxide   Meds  ordered this encounter  Medications   methylPREDNISolone (MEDROL DOSEPAK) 4 MG TBPK tablet    Sig: Take as directed in the package.    Dispense:  21 tablet    Refill:  0   We will see the patient in follow-up in 4 to 6 weeks time she is to call sooner should any new problems arise.  Gailen Shelter, MD Advanced Bronchoscopy PCCM [facility]    *This note was dictated using voice recognition software/Dragon.  Despite best efforts to proofread, errors can occur which can change the meaning. Any transcriptional errors that result from this process are unintentional and may not be fully corrected at the time of dictation.

## 2022-11-25 NOTE — Addendum Note (Signed)
Addended by: Josette Shimabukuro C on: 11/25/2022 11:40 AM   Modules accepted: Orders  

## 2022-11-25 NOTE — Addendum Note (Signed)
Addended by: Lonell Face C on: 11/25/2022 11:40 AM   Modules accepted: Orders

## 2022-11-26 ENCOUNTER — Encounter: Payer: Medicare Other | Admitting: *Deleted

## 2022-11-26 ENCOUNTER — Telehealth: Payer: Self-pay

## 2022-11-26 ENCOUNTER — Telehealth: Payer: Self-pay | Admitting: Cardiovascular Disease

## 2022-11-26 ENCOUNTER — Other Ambulatory Visit: Payer: Self-pay

## 2022-11-26 DIAGNOSIS — J449 Chronic obstructive pulmonary disease, unspecified: Secondary | ICD-10-CM | POA: Diagnosis not present

## 2022-11-26 DIAGNOSIS — Z86010 Personal history of colonic polyps: Secondary | ICD-10-CM

## 2022-11-26 MED ORDER — NA SULFATE-K SULFATE-MG SULF 17.5-3.13-1.6 GM/177ML PO SOLN: 1.0000 | Freq: Once | ORAL | 0 refills | Status: AC

## 2022-11-26 NOTE — Telephone Encounter (Signed)
Routing to Dr. Tobi Bastos as an Lorain Childes.

## 2022-11-26 NOTE — Telephone Encounter (Signed)
Pt has appt 12/01/22 with Cadence Fransico Michael, PAC. Will add pre op clearance needed to appt notes.

## 2022-11-26 NOTE — Telephone Encounter (Signed)
   Name: Gabrielle Pineda  DOB: [birth year]  MRN: [id number]  Primary Cardiologist: Julien Nordmann, MD  Chart reviewed as part of pre-operative protocol coverage. Because of Xuan Z Delfin's past medical history and time since last visit, she will require a follow-up in-office visit in order to better assess preoperative cardiovascular risk. Patient is scheduled with Cadence Fransico Michael, PA-C on 12/01/2022. Appointment notes have been updated to reflect preop clearance.   Pre-op covering staff:  - Please contact requesting surgeon's office via preferred method (i.e, phone, fax) to inform them of need for appointment prior to surgery.    Carlos Levering, NP  11/26/2022, 5:01 PM

## 2022-11-26 NOTE — Telephone Encounter (Signed)
   Pre-operative Risk Assessment    Patient Name: Gabrielle Pineda  DOB: 10/04/1955 MRN: [id number]      Request for Surgical Clearance    Procedure:  Colonoscopy  Date of Surgery:  Clearance TBD                                 Surgeon:  Not listed Surgeon's Group or Practice Name:  [facility] Phone number:  [phone] Fax number:  [phone]   Type of Clearance Requested:   - Medical    Type of Anesthesia:  General    Additional requests/questions:    SignedNarda Amber   11/26/2022, 3:40 PM

## 2022-11-26 NOTE — Telephone Encounter (Signed)
This patient was seen yesterday in clinic.  She was having a minor exacerbation of her COPD and required treatment.  She has follow-up appointment on 05 Jan 2023 to follow-up on this issue.  Because of the exacerbation it is not prudent to do general anesthesia while the patient is acutely decompensated.  Please see my note available through Epic.  I will be able to make a better pulmonary risk assessment on follow-up appointment on 21 May.  Gailen Shelter, MD Advanced Bronchoscopy PCCM [facility]

## 2022-11-26 NOTE — Progress Notes (Signed)
Daily Session Note  Patient Details  Name: Gabrielle Pineda MRN: [id number] Date of Birth: 01/26/1956 Referring Provider:   Flowsheet Row Pulmonary Rehab from 07/27/2022 in [facility] Cardiac and Pulmonary Rehab  Referring Provider Sarina Ser MD       Encounter Date: 11/26/2022  Check In:  Session Check In - 11/26/22 1059       Check-In   Supervising physician immediately available to respond to emergencies See telemetry face sheet for immediately available ER MD    Location [facility]-Cardiac & Pulmonary Rehab    Staff Present Lanny Hurst, RN, ADN;Meredith Jewel Baize, RN BSN;Jessica Juanetta Gosling, MA, RCEP, CCRP, CCET;Joseph Benbrook, [place]    Virtual Visit No    Medication changes reported     No    Fall or balance concerns reported    No    Warm-up and Cool-down Performed on first and last piece of equipment    Resistance Training Performed Yes    VAD Patient? No    PAD/SET Patient? No      Pain Assessment   Currently in Pain? No/denies                Social History   Tobacco Use  Smoking Status Former   Packs/day: 2.00   Years: 45.00   Additional pack years: 0.00   Total pack years: 90.00   Types: Cigarettes   Quit date: 10/28/2020   Years since quitting: 2.0  Smokeless Tobacco Never  Tobacco Comments   Ive been quit for about a year now.    Goals Met:  Independence with exercise equipment Exercise tolerated well No report of concerns or symptoms today Strength training completed today  Goals Unmet:  Not Applicable  Comments: Pt able to follow exercise prescription today without complaint.  Will continue to monitor for progression.    Dr. Bethann Punches is Medical Director for [facility] Cardiac Rehabilitation.  Dr. Vida Rigger is Medical Director for [facility] Pulmonary Rehabilitation.

## 2022-11-26 NOTE — Telephone Encounter (Signed)
Pt returning your call to schedule colonoscopy. Pt stated you can call either number.

## 2022-11-26 NOTE — Telephone Encounter (Signed)
Gastroenterology Pre-Procedure Review  Request Date: 12/09/22 Requesting Physician: Dr. Tobi Bastos  PATIENT REVIEW QUESTIONS: The patient responded to the following health history questions as indicated:    1. Are you having any GI issues? no 2. Do you have a personal history of Polyps? yes (last colonoscopy 11/01/2017 dr. Mechele Collin) 3. Do you have a family history of Colon Cancer or Polyps? no 4. Diabetes Mellitus? no 5. Joint replacements in the past 12 months?no 6. Major health problems in the past 3 months?no 7. Any artificial heart valves, MVP, or defibrillator?no 8. Cardiac health issues? Yes SVT Clearance sent to Dr. Mariah Milling 9. Pulmonary health issues? Yes Clearance sent to Dr. Jana Half    MEDICATIONS & ALLERGIES:    Patient reports the following regarding taking any anticoagulation/antiplatelet therapy:   Plavix, Coumadin, Eliquis, Xarelto, Lovenox, Pradaxa, Brilinta, or Effient? no Aspirin? no  Patient confirms/reports the following medications:  Current Outpatient Medications  Medication Sig Dispense Refill   acyclovir ointment (ZOVIRAX) 5 % :Topical Every 3 Hours as needed for lesion 30 g 1   albuterol (PROVENTIL) (2.5 MG/3ML) 0.083% nebulizer solution Take 3 mLs (2.5 mg total) by nebulization every 4 (four) hours as needed for wheezing or shortness of breath. 225 mL 2   albuterol (VENTOLIN HFA) 108 (90 Base) MCG/ACT inhaler INHALE TWO PUFFS BY MOUTH INTO LUNGS every SIX hours AS NEEDED FOR WHEEZING AND/OR SHORTNESS OF BREATH 8.5 g 2   ARIPiprazole (ABILIFY) 5 MG tablet TAKE ONE TABLET BY MOUTH EVERY MORNING 90 tablet 1   atorvastatin (LIPITOR) 40 MG tablet TAKE ONE TABLET BY MOUTH EVERY MORNING 90 tablet 0   Budeson-Glycopyrrol-Formoterol (BREZTRI AEROSPHERE) 160-9-4.8 MCG/ACT AERO Inhale 2 puffs into the lungs in the morning and at bedtime. 10.7 g 11   carvedilol (COREG) 12.5 MG tablet TAKE ONE TABLET BY MOUTH EVERY MORNING and TAKE ONE TABLET BY MOUTH EVERYDAY AT BEDTIME  180 tablet 0   dextromethorphan-guaiFENesin (MUCINEX DM) 30-600 MG 12hr tablet Take 1 tablet by mouth 2 (two) times daily.     ezetimibe (ZETIA) 10 MG tablet TAKE ONE TABLET BY MOUTH EVERY MORNING 90 tablet 0   fexofenadine (ALLEGRA) 180 MG tablet Take 180 mg by mouth daily.     furosemide (LASIX) 20 MG tablet Take 1 tablet (20 mg total) by mouth daily. 90 tablet 0   gabapentin (NEURONTIN) 400 MG capsule Take 1 capsule (400 mg total) by mouth 2 (two) times daily. 60 capsule 5   ipratropium (ATROVENT) 0.03 % nasal spray Place 2 sprays into both nostrils every 12 (twelve) hours. 30 mL 12   leflunomide (ARAVA) 20 MG tablet Take 20 mg by mouth at bedtime.     LINZESS 145 MCG CAPS capsule Take 145 mcg by mouth daily before breakfast.     losartan (COZAAR) 25 MG tablet Take 1 tablet (25 mg total) by mouth daily. 90 tablet 1   meloxicam (MOBIC) 15 MG tablet Take 15 mg by mouth at bedtime.     methylPREDNISolone (MEDROL DOSEPAK) 4 MG TBPK tablet Take as directed in the package. 21 tablet 0   mirtazapine (REMERON) 15 MG tablet TAKE ONE TABLET BY MOUTH EVERYDAY AT BEDTIME 90 tablet 0   montelukast (SINGULAIR) 10 MG tablet TAKE ONE TABLET BY MOUTH EVERYDAY AT BEDTIME 30 tablet 11   Multiple Vitamin (MULTIVITAMIN WITH MINERALS) TABS tablet Take 1 tablet by mouth daily.     Nebulizer MISC Compressor nebulizer to use with nebulized medications as instructed 1 each 0   omeprazole (  PRILOSEC) 40 MG capsule TAKE ONE CAPSULE BY MOUTH EVERY MORNING and TAKE ONE CAPSULE BY MOUTH EVERYDAY AT BEDTIME 180 capsule 1   oxyCODONE-acetaminophen (PERCOCET) 5-325 MG tablet Take 1 tablet by mouth every 8 (eight) hours as needed for severe pain. Must last 30 days. 90 tablet 0   [START ON 11/29/2022] oxyCODONE-acetaminophen (PERCOCET) 5-325 MG tablet Take 1 tablet by mouth every 8 (eight) hours as needed for severe pain. Must last 30 days. 90 tablet 0   [START ON 12/29/2022] oxyCODONE-acetaminophen (PERCOCET) 5-325 MG tablet Take 1  tablet by mouth every 8 (eight) hours as needed for severe pain. Must last 30 days. 90 tablet 0   Oxymetazoline HCl (MUCINEX NASAL SPRAY MOISTURE NA) Place into the nose.     potassium chloride SA (KLOR-CON M) 20 MEQ tablet Take 1 tablet (20 mEq total) by mouth daily with breakfast. 90 tablet 1   SPIKEVAX syringe      topiramate (TOPAMAX) 25 MG tablet Take 1 tablet (25 mg total) by mouth 2 (two) times daily. 60 tablet 11   valACYclovir (VALTREX) 1000 MG tablet TAKE TWO TABLETS BY MOUTH TWICE DAILY AS NEEDED FOR 5 DAYS FOR FEVER blisters 30 tablet 1   venlafaxine XR (EFFEXOR-XR) 75 MG 24 hr capsule TAKE THREE CAPSULES BY MOUTH EVERY MORNING 270 capsule 0   XIIDRA 5 % SOLN Place 1 drop into both eyes daily.     No current facility-administered medications for this visit.    Patient confirms/reports the following allergies:  Allergies  Allergen Reactions   Sulfasalazine Other (See Comments)   Plaquenil [Hydroxychloroquine] Rash    No orders of the defined types were placed in this encounter.   AUTHORIZATION INFORMATION Primary Insurance: 1D#: Group #:  Secondary Insurance: 1D#: Group #:  SCHEDULE INFORMATION: Date: 12/09/22 Time: Location: [facility]

## 2022-11-26 NOTE — Telephone Encounter (Signed)
-----   Message from Salena Saner, MD sent at 11/26/2022  3:40 PM EDT ----- Regarding: FW: Clinical FYI.  Patient was seen yesterday with an exacerbation, she will need to be reassessed on follow-up appointment.  Follow-up appointment is 21 May.  Dr. Reece Agar. ----- Message ----- From: [name], CMA Sent: 11/26/2022   2:52 PM EDT To: Salena Saner, MD Subject: Clinical

## 2022-11-30 LAB — ALLERGEN PANEL (27) + IGE
Alternaria Alternata IgE: <0.1 kU/L
Aspergillus Fumigatus IgE: <0.1 kU/L
Bahia Grass IgE: <0.1 kU/L
Bermuda Grass IgE: <0.1 kU/L
Cat Dander IgE: <0.1 kU/L
Cedar, Mountain IgE: <0.1 kU/L
Cladosporium Herbarum IgE: <0.1 kU/L
Cocklebur IgE: <0.1 kU/L
Cockroach, American IgE: <0.1 kU/L
Common Silver Birch IgE: <0.1 kU/L
D Farinae IgE: <0.1 kU/L
D Pteronyssinus IgE: <0.1 kU/L
Dog Dander IgE: <0.1 kU/L
Elm, American IgE: <0.1 kU/L
Hickory, White IgE: <0.1 kU/L
IgE (Immunoglobulin E), Serum: 2 IU/mL — ABNORMAL LOW (ref 6–495)
Johnson Grass IgE: <0.1 kU/L
Kentucky Bluegrass IgE: <0.1 kU/L
Maple/Box Elder IgE: <0.1 kU/L
Mucor Racemosus IgE: <0.1 kU/L
Oak, White IgE: <0.1 kU/L
Penicillium Chrysogen IgE: <0.1 kU/L
Pigweed, Rough IgE: <0.1 kU/L
Plantain, English IgE: <0.1 kU/L
Ragweed, Short IgE: <0.1 kU/L
Setomelanomma Rostrat: <0.1 kU/L
Timothy Grass IgE: <0.1 kU/L
White Mulberry IgE: <0.1 kU/L

## 2022-12-01 ENCOUNTER — Encounter: Payer: Medicare Other | Admitting: *Deleted

## 2022-12-01 ENCOUNTER — Ambulatory Visit: Payer: Medicare Other | Attending: Medical | Admitting: Medical

## 2022-12-01 ENCOUNTER — Encounter: Payer: Self-pay | Admitting: Medical

## 2022-12-01 VITALS — BP 148/88 | HR 74 | Ht 69.0 in | Wt 173.2 lb

## 2022-12-01 DIAGNOSIS — I10 Essential (primary) hypertension: Secondary | ICD-10-CM

## 2022-12-01 DIAGNOSIS — J449 Chronic obstructive pulmonary disease, unspecified: Secondary | ICD-10-CM | POA: Diagnosis not present

## 2022-12-01 DIAGNOSIS — I471 Supraventricular tachycardia, unspecified: Secondary | ICD-10-CM | POA: Diagnosis not present

## 2022-12-01 DIAGNOSIS — Z01810 Encounter for preprocedural cardiovascular examination: Secondary | ICD-10-CM

## 2022-12-01 NOTE — Progress Notes (Signed)
Cardiology Office Note:    Date:  12/01/2022   ID:  Gabrielle Pineda, DOB 09-26-1955, MRN [id number]  PCP:  [name], PA-C  [facility] Cardiologist:  Julien Nordmann, MD  [facility] Electrophysiologist:  None   Referring MD: [name], PA-C   Chief Complaint: 1 year follow-up/pre-op cardiac evaluation  History of Present Illness:    Gabrielle Pineda is a 67 y.o. female with a hx of tobacco use, COPD, hyperlipidemia, history of tachycardia/possible SVT, chronic neck and shoulder pain, rheumatoid arthritis who presents for follow-up.  Patient was hospitalized at [facility] in 2022 for COPD exacerbation and flu.  Echo at that time showed LVEF greater than 55%, mild TR.  Patient was last seen April 2023 was overall stable from a cardiac perspective.  Today, the patient is overall doing well from a cardiac perspective. She is needing a pre-op clearance prior to colonoscopy.She denies any chest pain or shortness of breath. She has occasional lower leg edema.She takes lasix 20 mg as needed. No orthopnea or pnd. No lightheadedness or dizziness. She was recently started on Losartan for BP. She is able to walk up a flight of stairs. She can walk 1 block. She can perform ADLs. She can do light household chores.   Past Medical History:  Diagnosis Date   Alcohol abuse    Allergy 1983   Anemia    Anxiety    Arthritis    Cataract    Cervicalgia    Cirrhosis 1994   COPD (chronic obstructive pulmonary disease)    Depression    Difficult intubation    has plates and screws in neck   Dyspnea    GERD (gastroesophageal reflux disease)    Headache    Heart murmur    on heard when pt is lying   Hypertension    Neuromuscular disorder    Neuropathy   Other and unspecified hyperlipidemia    Oxygen deficiency    Status post insertion of spinal cord stimulator    Tachycardia    d/t questionable anxiety happens every 5- 10 years, sees [facility]    Past Surgical  History:  Procedure Laterality Date   ANTERIOR CERVICAL DECOMP/DISCECTOMY FUSION  2012, 2015, 2018   x3   AUGMENTATION MAMMAPLASTY Bilateral 1982   BREAST ENHANCEMENT SURGERY  1981   CARPAL TUNNEL RELEASE  11/11/2011   Procedure: CARPAL TUNNEL RELEASE;  Surgeon: [name], MD;  Location: [facility];  Service: Neurosurgery;  Laterality: Right;  Right Median Nerve Decompression   CARPAL TUNNEL RELEASE  2013   COLONOSCOPY WITH PROPOFOL N/A 11/01/2017   Procedure: COLONOSCOPY WITH PROPOFOL;  Surgeon: [name], MD;  Location[facility] ENDOSCOPY;  Service: Endoscopy;  Laterality: N/A;   ESOPHAGOGASTRODUODENOSCOPY     JOINT REPLACEMENT  2022   LUMBAR LAMINECTOMY/DECOMPRESSION MICRODISCECTOMY N/A 08/15/2019   Procedure: Lumbar three to Sacral one Decompressive lumbar laminectomy;  Surgeon: [name], MD;  Location: [facility] OR;  Service: Neurosurgery;  Laterality: N/A;   neck disc surgery     plates and screws in neck x 2   ROTATOR CUFF REPAIR  06/16/2016   right shoulder    ROTATOR CUFF REPAIR     SPINAL CORD STIMULATOR INSERTION N/A 03/17/2021   Procedure: THORACIC SPINAL CORD STIMULATOR (PERCUTANEOUS) & PULSE GENERATOR PLACEMENT;  Surgeon: [name], MD;  Location: [facility] ORS;  Service: Neurosurgery;  Laterality: N/A;   SPINE SURGERY  2020   TONSILLECTOMY AND ADENOIDECTOMY  1973   TOTAL HIP  ARTHROPLASTY Right 04/15/2021   Procedure: TOTAL HIP ARTHROPLASTY ANTERIOR APPROACH;  Surgeon: [name], MD;  Location: [facility] ORS;  Service: Orthopedics;  Laterality: Right;    Current Medications: Current Meds  Medication Sig   acyclovir ointment (ZOVIRAX) 5 % :Topical Every 3 Hours as needed for lesion   albuterol (PROVENTIL) (2.5 MG/3ML) 0.083% nebulizer solution Take 3 mLs (2.5 mg total) by nebulization every 4 (four) hours as needed for wheezing or shortness of breath.   albuterol (VENTOLIN HFA) 108 (90 Base) MCG/ACT inhaler INHALE TWO PUFFS BY MOUTH INTO LUNGS every SIX hours AS  NEEDED FOR WHEEZING AND/OR SHORTNESS OF BREATH   ARIPiprazole (ABILIFY) 5 MG tablet TAKE ONE TABLET BY MOUTH EVERY MORNING   atorvastatin (LIPITOR) 40 MG tablet TAKE ONE TABLET BY MOUTH EVERY MORNING   Budeson-Glycopyrrol-Formoterol (BREZTRI AEROSPHERE) 160-9-4.8 MCG/ACT AERO Inhale 2 puffs into the lungs in the morning and at bedtime.   carvedilol (COREG) 12.5 MG tablet TAKE ONE TABLET BY MOUTH EVERY MORNING and TAKE ONE TABLET BY MOUTH EVERYDAY AT BEDTIME   dextromethorphan-guaiFENesin (MUCINEX DM) 30-600 MG 12hr tablet Take 1 tablet by mouth 2 (two) times daily.   ezetimibe (ZETIA) 10 MG tablet TAKE ONE TABLET BY MOUTH EVERY MORNING   fexofenadine (ALLEGRA) 180 MG tablet Take 180 mg by mouth daily.   furosemide (LASIX) 20 MG tablet Take 1 tablet (20 mg total) by mouth daily.   gabapentin (NEURONTIN) 400 MG capsule Take 1 capsule (400 mg total) by mouth 2 (two) times daily.   ipratropium (ATROVENT) 0.03 % nasal spray Place 2 sprays into both nostrils every 12 (twelve) hours.   leflunomide (ARAVA) 20 MG tablet Take 20 mg by mouth at bedtime.   LINZESS 145 MCG CAPS capsule Take 145 mcg by mouth daily before breakfast.   losartan (COZAAR) 25 MG tablet Take 1 tablet (25 mg total) by mouth daily.   meloxicam (MOBIC) 15 MG tablet Take 15 mg by mouth at bedtime.   mirtazapine (REMERON) 15 MG tablet TAKE ONE TABLET BY MOUTH EVERYDAY AT BEDTIME   montelukast (SINGULAIR) 10 MG tablet TAKE ONE TABLET BY MOUTH EVERYDAY AT BEDTIME   Multiple Vitamin (MULTIVITAMIN WITH MINERALS) TABS tablet Take 1 tablet by mouth daily.   Nebulizer MISC Compressor nebulizer to use with nebulized medications as instructed   omeprazole (PRILOSEC) 40 MG capsule TAKE ONE CAPSULE BY MOUTH EVERY MORNING and TAKE ONE CAPSULE BY MOUTH EVERYDAY AT BEDTIME   oxyCODONE-acetaminophen (PERCOCET) 5-325 MG tablet Take 1 tablet by mouth every 8 (eight) hours as needed for severe pain. Must last 30 days.   [START ON 12/29/2022]  oxyCODONE-acetaminophen (PERCOCET) 5-325 MG tablet Take 1 tablet by mouth every 8 (eight) hours as needed for severe pain. Must last 30 days.   Oxymetazoline HCl (MUCINEX NASAL SPRAY MOISTURE NA) Place into the nose.   potassium chloride SA (KLOR-CON M) 20 MEQ tablet Take 1 tablet (20 mEq total) by mouth daily with breakfast.   topiramate (TOPAMAX) 25 MG tablet Take 1 tablet (25 mg total) by mouth 2 (two) times daily.   valACYclovir (VALTREX) 1000 MG tablet TAKE TWO TABLETS BY MOUTH TWICE DAILY AS NEEDED FOR 5 DAYS FOR FEVER blisters   venlafaxine XR (EFFEXOR-XR) 75 MG 24 hr capsule TAKE THREE CAPSULES BY MOUTH EVERY MORNING   XIIDRA 5 % SOLN Place 1 drop into both eyes daily.     Allergies:   Sulfasalazine and Plaquenil [hydroxychloroquine]   Social History   Socioeconomic History   Marital status:  Married    Spouse name: jerry   Number of children: 0   Years of education: Not on file   Highest education level: Bachelor's degree (e.g., BA, AB, BS)  Occupational History   Not on file  Tobacco Use   Smoking status: Former    Packs/day: 2.00    Years: 45.00    Additional pack years: 0.00    Total pack years: 90.00    Types: Cigarettes    Quit date: 10/28/2020    Years since quitting: 2.0   Smokeless tobacco: Never   Tobacco comments:    [name] been quit for about a year now.  Vaping Use   Vaping Use: Former   Devices: vape occasionally  Substance and Sexual Activity   Alcohol use: No    Comment: recovering alcoholic Since 1994    Drug use: Never   Sexual activity: Not Currently    Birth control/protection: Abstinence  Other Topics Concern   Not on file  Social History Narrative   Married; full time; does not get regular exercise.    Social Determinants of Health   Financial Resource Strain: Low Risk  (11/09/2022)   Overall Financial Resource Strain (CARDIA)    Difficulty of Paying Living Expenses: Not hard at all  Food Insecurity: No Food Insecurity (11/09/2022)   Hunger  Vital Sign    Worried About Running Out of Food in the Last Year: Never true    Ran Out of Food in the Last Year: Never true  Transportation Needs: No Transportation Needs (11/09/2022)   PRAPARE - Administrator, Civil Service (Medical): No    Lack of Transportation (Non-Medical): No  Physical Activity: Insufficiently Active (11/09/2022)   Exercise Vital Sign    Days of Exercise per Week: 3 days    Minutes of Exercise per Session: 30 min  Stress: No Stress Concern Present (11/09/2022)   [employer] of Occupational Health - Occupational Stress Questionnaire    Feeling of Stress : Not at all  Social Connections: Moderately Isolated (11/09/2022)   Social Connection and Isolation Panel [NHANES]    Frequency of Communication with Friends and Family: More than three times a week    Frequency of Social Gatherings with Friends and Family: More than three times a week    Attends Religious Services: Never    Database administrator or Organizations: No    Attends Engineer, structural: Never    Marital Status: Married     Family History: The patient's family history includes Alcohol abuse in her father, mother, sister, and sister; Anxiety disorder in her sister; Arthritis in her mother; Bipolar disorder in her mother; Cancer in her father; Depression in her sister; Drug abuse in her sister; Early death in her mother; Pancreatic cancer in her father; Suicidality in her mother. There is no history of Anesthesia problems or Breast cancer.  ROS:   Please see the history of present illness.     All other systems reviewed and are negative.  EKGs/Labs/Other Studies Reviewed:    The following studies were reviewed today:  Echo 11/01/20  1. The left ventricle is normal in size with normal wall thickness.    2. The left ventricular systolic function is normal, LVEF is visually  estimated at > 55%.    3. The right ventricle is normal in size, with normal systolic function.    4.  There is mild tricuspid regurgitation.    5. There is no pulmonary hypertension, estimated  pulmonary artery systolic  pressure is 33 mmHg.    6. Technically difficult study due to chest wall/lung interference.    7. Echo contrast utilized to enhance endocardial border definition.   EKG:  EKG is ordered today.  The ekg ordered today demonstrates   Recent Labs: 03/10/2022: TSH 3.673 09/11/2022: ALT 17; BUN 11; Creatinine, Ser 1.00; Potassium 4.1; Sodium 139 11/25/2022: Hemoglobin 9.9; Platelets 221  Recent Lipid Panel    Component Value Date/Time   CHOL 170 11/23/2022 1027   TRIG 196 (H) 11/23/2022 1027   HDL 59 11/23/2022 1027   CHOLHDL 2.9 11/23/2022 1027   LDLCALC 78 11/23/2022 1027    Physical Exam:    VS:  BP (!) 148/88 (BP Location: Right Arm, Patient Position: Sitting, Cuff Size: Normal)   Pulse 74   Ht  (1.753 m)   Wt 173 lb 3.2 oz (78.6 kg)   SpO2 97%   BMI 25.58 kg/m     Wt Readings from Last 3 Encounters:  12/01/22 173 lb 3.2 oz (78.6 kg)  11/25/22 176 lb 3.2 oz (79.9 kg)  11/23/22 175 lb 1.6 oz (79.4 kg)     GEN:  Well nourished, well developed in no acute distress HEENT: Normal NECK: No JVD; No carotid bruits LYMPHATICS: No lymphadenopathy CARDIAC: RRR, no murmurs, rubs, gallops RESPIRATORY:  Clear to auscultation without rales, wheezing or rhonchi  ABDOMEN: Soft, non-tender, non-distended MUSCULOSKELETAL:  No edema; No deformity  SKIN: Warm and dry NEUROLOGIC:  Alert and oriented x 3 PSYCHIATRIC:  Normal affect   ASSESSMENT:    1. Pre-operative cardiovascular examination   2. Paroxysmal supraventricular tachycardia   3. Essential hypertension    PLAN:    In order of problems listed above:  Pre-operative cardiac evaluation for routine colonoscopy.  H/o tachycardia/palpitations HTN Patient has upcoming routine colonoscopy, no date scheduled yet.  Patient is overall stable from a cardiac perspective.  She denies chest pain, shortness of  breath, significant lower leg edema, lightheadedness, dizziness, palpitations.  Patient is taking Coreg 12.5 mg twice daily and losartan 25 mg daily for blood pressure control.  Blood pressure mildly elevated today, can increase medications as tolerated.  BP is being followed by PCP.  Patient reports fair activity level.  She is able to perform ADLs, light work around the house, walk up a flight of stairs in 1 block.  METs greater than 4.  According to the revised cardiac risk index she is class I risk with 3.9% 30-day risk of death, MI, or cardiac arrest.  No further cardiac workup prior to colonoscopy.  Disposition: Follow up in 1 year(s) with MD   Signed, Zoe Goonan David Stall, PA-C  12/01/2022 11:46 AM    [facility]

## 2022-12-01 NOTE — Progress Notes (Signed)
Daily Session Note  Patient Details  Name: Gabrielle Pineda MRN: [id number] Date of Birth: [birth year] Referring Provider:   Flowsheet Row Pulmonary Rehab from 07/27/2022 in [facility]  Referring Provider Sarina Ser MD       Encounter Date: 12/01/2022  Check In:  Session Check In - 12/01/22 1018       Check-In   Supervising physician immediately available to respond to emergencies See telemetry face sheet for immediately available ER MD    Location [facility]    Staff Present Cyndia Diver, RN, BSN, Clyde Canterbury MS, RDN, LDN;Jessica Valdosta, MA, RCEP, CCRP, Zackery Barefoot, MS, ACSM CEP, Exercise Physiologist    Virtual Visit No    Medication changes reported     No    Fall or balance concerns reported    No    Tobacco Cessation No Change    Warm-up and Cool-down Performed on first and last piece of equipment    Resistance Training Performed Yes    VAD Patient? No    PAD/SET Patient? No      Pain Assessment   Currently in Pain? No/denies                Social History   Tobacco Use  Smoking Status Former   Packs/day: 2.00   Years: 45.00   Additional pack years: 0.00   Total pack years: 90.00   Types: Cigarettes   Quit date: 10/28/2020   Years since quitting: 2.0  Smokeless Tobacco Never  Tobacco Comments   Ive been quit for about a year now.    Goals Met:  Independence with exercise equipment Exercise tolerated well No report of concerns or symptoms today  Goals Unmet:  Not Applicable  Comments: Pt able to follow exercise prescription today without complaint.  Will continue to monitor for progression.    Dr. Bethann Punches is Medical Director for [facility].  Dr. Vida Rigger is Medical Director for [facility].

## 2022-12-01 NOTE — Patient Instructions (Signed)
Medication Instructions:  Your physician recommends that you continue on your current medications as directed. Please refer to the Current Medication list given to you today.  *If you need a refill on your cardiac medications before your next appointment, please call your pharmacy*   Lab Work: -None ordered  If you have labs (blood work) drawn today and your tests are completely normal, you will receive your results only by: MyChart Message (if you have MyChart) OR A paper copy in the mail If you have any lab test that is abnormal or we need to change your treatment, we will call you to review the results.  Testing/Procedures: -None ordered  Follow-Up: At [facility], you and your health needs are our priority.  As part of our continuing mission to provide you with exceptional heart care, we have created designated Provider Care Teams.  These Care Teams include your primary Cardiologist (physician) and Advanced Practice Providers (APPs -  Physician Assistants and Nurse Practitioners) who all work together to provide you with the care you need, when you need it.  We recommend signing up for the patient portal called "MyChart".  Sign up information is provided on this After Visit Summary.  MyChart is used to connect with patients for Virtual Visits (Telemedicine).  Patients are able to view lab/test results, encounter notes, upcoming appointments, etc.  Non-urgent messages can be sent to your provider as well.   To learn more about what you can do with MyChart, go to [email or web address].[email or web address].    Your next appointment:   1 year(s)  Provider:   You may see Timothy Gollan, MD or one of the following Advanced Practice Providers on your designated Care Team:   Christopher Berge, NP Ryan Dunn, PA-C Cadence Furth, PA-C Sheri Hammock, NP   Other Instructions -None  

## 2022-12-02 ENCOUNTER — Encounter: Payer: Self-pay | Admitting: *Deleted

## 2022-12-02 DIAGNOSIS — J449 Chronic obstructive pulmonary disease, unspecified: Secondary | ICD-10-CM

## 2022-12-02 NOTE — Progress Notes (Signed)
Pulmonary Individual Treatment Plan  Patient Details  Name: PEGAH SEGEL MRN: [id number] Date of Birth: 10/04/1955 Referring Provider:   Flowsheet Row Pulmonary Rehab from 07/27/2022 in [facility] Cardiac and Pulmonary Rehab  Referring Provider Sarina Ser MD       Initial Encounter Date:  Flowsheet Row Pulmonary Rehab from 07/27/2022 in [facility] Cardiac and Pulmonary Rehab  Date 07/27/22       Visit Diagnosis: Chronic obstructive pulmonary disease, unspecified COPD type  Patient's Home Medications on Admission:  Current Outpatient Medications:    acyclovir ointment (ZOVIRAX) 5 %, :Topical Every 3 Hours as needed for lesion, Disp: 30 g, Rfl: 1   albuterol (PROVENTIL) (2.5 MG/3ML) 0.083% nebulizer solution, Take 3 mLs (2.5 mg total) by nebulization every 4 (four) hours as needed for wheezing or shortness of breath., Disp: 225 mL, Rfl: 2   albuterol (VENTOLIN HFA) 108 (90 Base) MCG/ACT inhaler, INHALE TWO PUFFS BY MOUTH INTO LUNGS every SIX hours AS NEEDED FOR WHEEZING AND/OR SHORTNESS OF BREATH, Disp: 8.5 g, Rfl: 2   ARIPiprazole (ABILIFY) 5 MG tablet, TAKE ONE TABLET BY MOUTH EVERY MORNING, Disp: 90 tablet, Rfl: 1   atorvastatin (LIPITOR) 40 MG tablet, TAKE ONE TABLET BY MOUTH EVERY MORNING, Disp: 90 tablet, Rfl: 0   Budeson-Glycopyrrol-Formoterol (BREZTRI AEROSPHERE) 160-9-4.8 MCG/ACT AERO, Inhale 2 puffs into the lungs in the morning and at bedtime., Disp: 10.7 g, Rfl: 11   carvedilol (COREG) 12.5 MG tablet, TAKE ONE TABLET BY MOUTH EVERY MORNING and TAKE ONE TABLET BY MOUTH EVERYDAY AT BEDTIME, Disp: 180 tablet, Rfl: 0   dextromethorphan-guaiFENesin (MUCINEX DM) 30-600 MG 12hr tablet, Take 1 tablet by mouth 2 (two) times daily., Disp: , Rfl:    ezetimibe (ZETIA) 10 MG tablet, TAKE ONE TABLET BY MOUTH EVERY MORNING, Disp: 90 tablet, Rfl: 0   fexofenadine (ALLEGRA) 180 MG tablet, Take 180 mg by mouth daily., Disp: , Rfl:    furosemide (LASIX) 20 MG tablet, Take 1 tablet (20 mg  total) by mouth daily., Disp: 90 tablet, Rfl: 0   gabapentin (NEURONTIN) 400 MG capsule, Take 1 capsule (400 mg total) by mouth 2 (two) times daily., Disp: 60 capsule, Rfl: 5   ipratropium (ATROVENT) 0.03 % nasal spray, Place 2 sprays into both nostrils every 12 (twelve) hours., Disp: 30 mL, Rfl: 12   leflunomide (ARAVA) 20 MG tablet, Take 20 mg by mouth at bedtime., Disp: , Rfl:    LINZESS 145 MCG CAPS capsule, Take 145 mcg by mouth daily before breakfast., Disp: , Rfl:    losartan (COZAAR) 25 MG tablet, Take 1 tablet (25 mg total) by mouth daily., Disp: 90 tablet, Rfl: 1   meloxicam (MOBIC) 15 MG tablet, Take 15 mg by mouth at bedtime., Disp: , Rfl:    methylPREDNISolone (MEDROL DOSEPAK) 4 MG TBPK tablet, Take as directed in the package. (Patient not taking: Reported on 12/01/2022), Disp: 21 tablet, Rfl: 0   mirtazapine (REMERON) 15 MG tablet, TAKE ONE TABLET BY MOUTH EVERYDAY AT BEDTIME, Disp: 90 tablet, Rfl: 0   montelukast (SINGULAIR) 10 MG tablet, TAKE ONE TABLET BY MOUTH EVERYDAY AT BEDTIME, Disp: 30 tablet, Rfl: 11   Multiple Vitamin (MULTIVITAMIN WITH MINERALS) TABS tablet, Take 1 tablet by mouth daily., Disp: , Rfl:    Nebulizer MISC, Compressor nebulizer to use with nebulized medications as instructed, Disp: 1 each, Rfl: 0   omeprazole (PRILOSEC) 40 MG capsule, TAKE ONE CAPSULE BY MOUTH EVERY MORNING and TAKE ONE CAPSULE BY MOUTH EVERYDAY AT BEDTIME, Disp: 180  capsule, Rfl: 1   oxyCODONE-acetaminophen (PERCOCET) 5-325 MG tablet, Take 1 tablet by mouth every 8 (eight) hours as needed for severe pain. Must last 30 days., Disp: 90 tablet, Rfl: 0   [START ON 12/29/2022] oxyCODONE-acetaminophen (PERCOCET) 5-325 MG tablet, Take 1 tablet by mouth every 8 (eight) hours as needed for severe pain. Must last 30 days., Disp: 90 tablet, Rfl: 0   Oxymetazoline HCl (MUCINEX NASAL SPRAY MOISTURE NA), Place into the nose., Disp: , Rfl:    potassium chloride SA (KLOR-CON M) 20 MEQ tablet, Take 1 tablet (20  mEq total) by mouth daily with breakfast., Disp: 90 tablet, Rfl: 1   SPIKEVAX syringe, , Disp: , Rfl:    topiramate (TOPAMAX) 25 MG tablet, Take 1 tablet (25 mg total) by mouth 2 (two) times daily., Disp: 60 tablet, Rfl: 11   valACYclovir (VALTREX) 1000 MG tablet, TAKE TWO TABLETS BY MOUTH TWICE DAILY AS NEEDED FOR 5 DAYS FOR FEVER blisters, Disp: 30 tablet, Rfl: 1   venlafaxine XR (EFFEXOR-XR) 75 MG 24 hr capsule, TAKE THREE CAPSULES BY MOUTH EVERY MORNING, Disp: 270 capsule, Rfl: 0   XIIDRA 5 % SOLN, Place 1 drop into both eyes daily., Disp: , Rfl:   Past Medical History: Past Medical History:  Diagnosis Date   Alcohol abuse    Allergy 1983   Anemia    Anxiety    Arthritis    Cataract    Cervicalgia    Cirrhosis 1994   COPD (chronic obstructive pulmonary disease)    Depression    Difficult intubation    has plates and screws in neck   Dyspnea    GERD (gastroesophageal reflux disease)    Headache    Heart murmur    on heard when pt is lying   Hypertension    Neuromuscular disorder    Neuropathy   Other and unspecified hyperlipidemia    Oxygen deficiency    Status post insertion of spinal cord stimulator    Tachycardia    d/t questionable anxiety happens every 5- 10 years, sees [facility]    Tobacco Use: Social History   Tobacco Use  Smoking Status Former   Packs/day: 2.00   Years: 45.00   Additional pack years: 0.00   Total pack years: 90.00   Types: Cigarettes   Quit date: 10/28/2020   Years since quitting: 2.0  Smokeless Tobacco Never  Tobacco Comments   Ive been quit for about a year now.    Labs: Review Flowsheet  More data exists      Latest Ref Rng & Units 04/19/2020 03/17/2021 06/26/2021 11/17/2021 11/23/2022  Labs for [facility]P Cardiac and Pulmonary Rehab  Cholestrol 100 - 199 mg/dL 119  - 147  829  562   LDL (calc) 0 - 99 mg/dL 98  - 50  79  78   HDL-C >39 mg/dL 56  - 39  40  59   Trlycerides 0 - 149 mg/dL 130  - 865  784  696   Hemoglobin A1c 4.8 -  5.6 % - - 5.3  - 5.3   TCO2 22 - 32 mmol/L - 21  - - -     Pulmonary Assessment Scores:  Pulmonary Assessment Scores     Row Name 07/27/22 1435         ADL UCSD   ADL Phase Entry     SOB Score total 71     Rest 0     Walk 2  Stairs 5     Bath 2     Dress 3     Shop 4       CAT Score   CAT Score 17       mMRC Score   mMRC Score 2              UCSD: Self-administered rating of dyspnea associated with activities of daily living (ADLs) 6-point scale (0 = "not at all" to 5 = "maximal or unable to do because of breathlessness")  Scoring Scores range from 0 to 120.  Minimally important difference is 5 units  CAT: CAT can identify the health impairment of COPD patients and is better correlated with disease progression.  CAT has a scoring range of zero to 40. The CAT score is classified into four groups of low (less than 10), medium (10 - 20), high (21-30) and very high (31-40) based on the impact level of disease on health status. A CAT score over 10 suggests significant symptoms.  A worsening CAT score could be explained by an exacerbation, poor medication adherence, poor inhaler technique, or progression of COPD or comorbid conditions.  CAT MCID is 2 points  mMRC: mMRC (Modified Medical Research Council) Dyspnea Scale is used to assess the degree of baseline functional disability in patients of respiratory disease due to dyspnea. No minimal important difference is established. A decrease in score of 1 point or greater is considered a positive change.   Pulmonary Function Assessment:  Pulmonary Function Assessment - 07/27/22 1433       Pulmonary Function Tests   FVC% 64 %   Collected 02/04/22   FEV1% 54 %    FEV1/FVC Ratio 64    RV% 144 %    DLCO% 51 %      Breath   Shortness of Breath Yes;Limiting activity;Fear of Shortness of Breath             Exercise Target Goals: Exercise Program Goal: Individual exercise prescription set using results from  initial 6 min walk test and THRR while considering  patient's activity barriers and safety.   Exercise Prescription Goal: Initial exercise prescription builds to 30-45 minutes a day of aerobic activity, 2-3 days per week.  Home exercise guidelines will be given to patient during program as part of exercise prescription that the participant will acknowledge.  Education: Aerobic Exercise: - Group verbal and visual presentation on the components of exercise prescription. Introduces F.I.T.T principle from ACSM for exercise prescriptions.  Reviews F.I.T.T. principles of aerobic exercise including progression. Written material given at graduation. Flowsheet Row Pulmonary Rehab from 11/26/2022 in [facility] Cardiac and Pulmonary Rehab  Date 11/12/22  Educator [facility]  Instruction Review Code 1- Verbalizes Understanding       Education: Resistance Exercise: - Group verbal and visual presentation on the components of exercise prescription. Introduces F.I.T.T principle from ACSM for exercise prescriptions  Reviews F.I.T.T. principles of resistance exercise including progression. Written material given at graduation. Flowsheet Row Pulmonary Rehab from 11/26/2022 in [facility] Cardiac and Pulmonary Rehab  Date 09/10/22  Educator [facility]  Instruction Review Code 1- Verbalizes Understanding        Education: Exercise & Equipment Safety: - Individual verbal instruction and demonstration of equipment use and safety with use of the equipment. Flowsheet Row Pulmonary Rehab from 11/26/2022 in [facility] Cardiac and Pulmonary Rehab  Date 07/16/22  Educator [facility]  Instruction Review Code 1- Verbalizes Understanding       Education: Exercise Physiology & General Exercise Guidelines: -  Group verbal and written instruction with models to review the exercise physiology of the cardiovascular system and associated critical values. Provides general exercise guidelines with specific guidelines to those with heart or lung disease.  Flowsheet  Row Pulmonary Rehab from 11/26/2022 in [facility] Cardiac and Pulmonary Rehab  Date 11/05/22  Educator [facility]  Instruction Review Code 1- Verbalizes Understanding       Education: Flexibility, Balance, Mind/Body Relaxation: - Group verbal and visual presentation with interactive activity on the components of exercise prescription. Introduces F.I.T.T principle from ACSM for exercise prescriptions. Reviews F.I.T.T. principles of flexibility and balance exercise training including progression. Also discusses the mind body connection.  Reviews various relaxation techniques to help reduce and manage stress (i.e. Deep breathing, progressive muscle relaxation, and visualization). Balance handout provided to take home. Written material given at graduation. Flowsheet Row Pulmonary Rehab from 11/26/2022 in [facility] Cardiac and Pulmonary Rehab  Date 09/17/22  Educator [facility]  Instruction Review Code 1- Verbalizes Understanding       Activity Barriers & Risk Stratification:   6 Minute Walk:  6 Minute Walk     Row Name 07/27/22 1425         6 Minute Walk   Phase Initial     Distance 1035 feet     Walk Time 6 minutes     # of Rest Breaks 0     MPH 1.96     METS 3.24     RPE 14     Perceived Dyspnea  2     VO2 Peak 11.33     Symptoms Yes (comment)     Comments Hip pain 6/10     Resting HR 70 bpm     Resting BP 126/70     Resting Oxygen Saturation  94 %     Exercise Oxygen Saturation  during 6 min walk 92 %     Max Ex. HR 110 bpm     Max Ex. BP 184/74     2 Minute Post BP 154/72       Interval HR   1 Minute HR 96     2 Minute HR 100     3 Minute HR 99     4 Minute HR 97     5 Minute HR 103     6 Minute HR 110     2 Minute Post HR 77     Interval Heart Rate? Yes       Interval Oxygen   Interval Oxygen? Yes     Baseline Oxygen Saturation % 94 %     1 Minute Oxygen Saturation % 93 %     1 Minute Liters of Oxygen 0 L  Room Air     2 Minute Oxygen Saturation % 92 %     2 Minute Liters of  Oxygen 0 L     3 Minute Oxygen Saturation % 93 %     3 Minute Liters of Oxygen 0 L     4 Minute Oxygen Saturation % 93 %     4 Minute Liters of Oxygen 0 L     5 Minute Oxygen Saturation % 93 %     5 Minute Liters of Oxygen 0 L     6 Minute Oxygen Saturation % 93 %     6 Minute Liters of Oxygen 0 L     2 Minute Post Oxygen Saturation % 94 %     2 Minute Post Liters of Oxygen 0  L             Oxygen Initial Assessment:  Oxygen Initial Assessment - 11/17/22 0937       Home Oxygen   Home Oxygen Device Home Concentrator    Sleep Oxygen Prescription Continuous    Liters per minute 2    Home Exercise Oxygen Prescription None    Home Resting Oxygen Prescription None    Compliance with Home Oxygen Use Yes      Initial 6 min Walk   Oxygen Used None      Program Oxygen Prescription   Program Oxygen Prescription None    Comments sleep only      Intervention   Short Term Goals To learn and demonstrate proper pursed lip breathing techniques or other breathing techniques.     Long  Term Goals Exhibits proper breathing techniques, such as pursed lip breathing or other method taught during program session             Oxygen Re-Evaluation:  Oxygen Re-Evaluation     Row Name 08/04/22 0953 09/01/22 0945 09/17/22 1204 10/13/22 0950 11/17/22 0937     Program Oxygen Prescription   Program Oxygen Prescription Continuous Continuous Continuous None --   Liters per minute -- --   Comments sleep only sleep only sleep only -- --     Home Oxygen   Home Oxygen Device Home Concentrator Home Concentrator Home Concentrator Home Concentrator --   Sleep Oxygen Prescription Continuous Continuous Continuous Continuous --   Liters per minute --   Home Exercise Oxygen Prescription None None None None --   Home Resting Oxygen Prescription None None None None --   Compliance with Home Oxygen Use Yes Yes Yes Yes --     Goals/Expected Outcomes   Short Term Goals To learn and  demonstrate proper pursed lip breathing techniques or other breathing techniques.  To learn and demonstrate proper pursed lip breathing techniques or other breathing techniques.  To learn and demonstrate proper pursed lip breathing techniques or other breathing techniques.  To learn and demonstrate proper pursed lip breathing techniques or other breathing techniques.  --   Long  Term Goals Exhibits proper breathing techniques, such as pursed lip breathing or other method taught during program session Exhibits proper breathing techniques, such as pursed lip breathing or other method taught during program session Exhibits proper breathing techniques, such as pursed lip breathing or other method taught during program session Exhibits proper breathing techniques, such as pursed lip breathing or other method taught during program session --   Comments Reviewed PLB technique with pt.  Talked about how it works and it's importance in maintaining their exercise saturations. Jancy is still on 2L of O2 during sleep. She reports that she used her oxygen more during the day while she was sick, but she has returned to only using it during sleep. We reviewd PLB technique and she was encouraged to practice PLB during exercise. She has a pulse-ox at home and she states that she has been checking her O2 saturations at home. She states that her O2 stayed around 92% while she was sick, but since she has gotten better it has been ranging between 95-96% at rest. Diaphragmatic and PLB breathing explained and performed with patient. Patient has a better understanding of how to do these exercises to help with breathing performance and relaxation. Patient performed breathing techniques adequately and to practice further at home. Kacy reports her shortness  of breath has improved since starting the program. She practices PLB and finds that it helps, she has a pulse oximeter at home; reviewed what to look for and troubleshooting. Deondra  believes she is not wheezing as much and feels her endurance has improved. She is not always using her pulse ox but reviewed the importance of checking it and ensuring her oxygen above 88%. She still uses PLB when needed.   Goals/Expected Outcomes Short: Become more profiecient at using PLB. Long: Become independent at using PLB. Short: Become more profiecient at using PLB. Long: Become independent at using PLB. Short: practice PLB and diaphragmatic breathing at home. Long: Use PLB and diaphragmatic breathing independently post [employer]. Short: practice PLB and diaphragmatic breathing at home. Long: Use PLB and diaphragmatic breathing independently post [employer]. Short: Start using pulse ox at home, monitor HR and O2 Long: Become proficient at PLB long-term            Oxygen Discharge (Final Oxygen Re-Evaluation):  Oxygen Re-Evaluation - 11/17/22 0937       Goals/Expected Outcomes   Comments Kadijah believes she is not wheezing as much and feels her endurance has improved. She is not always using her pulse ox but reviewed the importance of checking it and ensuring her oxygen above 88%. She still uses PLB when needed.    Goals/Expected Outcomes Short: Start using pulse ox at home, monitor HR and O2 Long: Become proficient at PLB long-term             Initial Exercise Prescription:  Initial Exercise Prescription - 07/27/22 1400       Date of Initial Exercise RX and Referring Provider   Date 07/27/22    Referring Provider Sarina Ser MD      Oxygen   Maintain Oxygen Saturation 88% or higher      Recumbant Bike   Level 2    RPM 50    Watts 30    Minutes 15    METs 3      NuStep   Level 3    SPM 80    Minutes 15    METs 3      REL-XR   Level 2    Speed 50    Minutes 15    METs 3      Track   Laps 27    Minutes 15    METs 2.47      Prescription Details   Frequency (times per week) 2    Duration Progress to 30 minutes of continuous aerobic without  signs/symptoms of physical distress      Intensity   THRR 40-80% of Max Heartrate 104-138    Ratings of Perceived Exertion 11-13    Perceived Dyspnea 0-4      Progression   Progression Continue to progress workloads to maintain intensity without signs/symptoms of physical distress.      Resistance Training   Training Prescription Yes    Weight 3 lb    Reps 10-15             Perform Capillary Blood Glucose checks as needed.  Exercise Prescription Changes:   Exercise Prescription Changes     Row Name 07/27/22 1400 08/13/22 1200 09/08/22 1400 09/22/22 0800 09/22/22 0900     Response to Exercise   Blood Pressure (Admit) 126/70 118/70 126/84 142/90 --   Blood Pressure (Exercise) 184/74 132/80 130/86 152/88 --   Blood Pressure (Exit) 128/70 122/76 122/74 126/84 --   Heart Rate (  Admit) 70 bpm 73 bpm 70 bpm 80 bpm --   Heart Rate (Exercise) 110 bpm 108 bpm 95 bpm 95 bpm --   Heart Rate (Exit) 77 bpm 73 bpm 81 bpm 82 bpm --   Oxygen Saturation (Admit) 94 % 90 % 97 % 97 % --   Oxygen Saturation (Exercise) 92 % 98 % 93 % 92 % --   Oxygen Saturation (Exit) 93 % 97 % 96 % 94 % --   Rating of Perceived Exertion (Exercise) 14 15 15 15  --   Perceived Dyspnea (Exercise) 2 2 2 2  --   Symptoms hip pain 6/10 none SOB SOB --   Comments walk test resutls third full day of exercise -- -- --   Duration -- Progress to 30 minutes of  aerobic without signs/symptoms of physical distress Continue with 30 min of aerobic exercise without signs/symptoms of physical distress. Continue with 30 min of aerobic exercise without signs/symptoms of physical distress. --   Intensity -- THRR unchanged THRR unchanged THRR unchanged --     Progression   Progression -- Continue to progress workloads to maintain intensity without signs/symptoms of physical distress. Continue to progress workloads to maintain intensity without signs/symptoms of physical distress. Continue to progress workloads to maintain intensity  without signs/symptoms of physical distress. --   Average METs -- 2.74 2.8 2.96 --     Resistance Training   Training Prescription -- Yes Yes Yes --   Weight -- 3 lb 3 lb 3 lb --   Reps -- 10-15 10-15 10-15 --     Interval Training   Interval Training -- No No No --     Recumbant Bike   Level -- 2 2 1.5 --   Watts -- -- 31 31 --   Minutes -- 15 15 15  --   METs -- 2.3 2.1 2.2 --     NuStep   Level -- 3 3 4  --   Minutes -- 15 15 15  --   METs -- 2.9 -- 3 --     REL-XR   Level -- 2 1 2  --   Minutes -- 15 15 15  --   METs -- 3.1 3 3.6 --     Track   Laps -- 30 30 30  --   Minutes -- 15 15 15  --   METs -- 2.63 2.63 2.63 --     Home Exercise Plan   Plans to continue exercise at -- -- -- -- Home (comment)  walking   Frequency -- -- -- -- Add 2 additional days to program exercise sessions.   Initial Home Exercises Provided -- -- -- -- 09/22/22     Oxygen   Maintain Oxygen Saturation -- 88% or higher 88% or higher 88% or higher --    Row Name 10/06/22 1400 10/21/22 1300 11/18/22 1500 12/01/22 1500       Response to Exercise   Blood Pressure (Admit) 128/84 128/84 118/62 124/82    Blood Pressure (Exercise) 136/64 136/74 -- --    Blood Pressure (Exit) 132/72 120/70 122/70 110/68    Heart Rate (Admit) 70 bpm 75 bpm 71 bpm 74 bpm    Heart Rate (Exercise) 95 bpm 96 bpm 111 bpm 99 bpm    Heart Rate (Exit) 83 bpm 87 bpm 82 bpm 85 bpm    Oxygen Saturation (Admit) 96 % 97 % 98 % 96 %    Oxygen Saturation (Exercise) 94 % 91 % 93 % 91 %  Oxygen Saturation (Exit) 96 % 98 % 96 % 92 %    Rating of Perceived Exertion (Exercise) 16 16 15 14     Perceived Dyspnea (Exercise) 3 1 1 1     Symptoms SOB SOB SOB SOB    Duration Continue with 30 min of aerobic exercise without signs/symptoms of physical distress. Continue with 30 min of aerobic exercise without signs/symptoms of physical distress. Continue with 30 min of aerobic exercise without signs/symptoms of physical distress. Continue with  30 min of aerobic exercise without signs/symptoms of physical distress.    Intensity THRR unchanged THRR unchanged THRR unchanged THRR unchanged      Progression   Progression Continue to progress workloads to maintain intensity without signs/symptoms of physical distress. Continue to progress workloads to maintain intensity without signs/symptoms of physical distress. Continue to progress workloads to maintain intensity without signs/symptoms of physical distress. Continue to progress workloads to maintain intensity without signs/symptoms of physical distress.    Average METs 3.05 2.93 2.99 3.04      Resistance Training   Training Prescription Yes Yes Yes Yes    Weight 3 lb 3 lb 3 lb 3 lb    Reps 10-15 10-15 10-15 10-15      Interval Training   Interval Training No No No No      Recumbant Bike   Level 2 2 2  --    Watts 31 31 31  --    Minutes 15 15 15  --    METs 3.23 3.26 2.8 --      NuStep   Level 4 2 2 2     Minutes 15 15 15 15     METs 2.2 2.5 2.6 2.6      REL-XR   Level 2 2 2 2     Minutes 15 15 15 15     METs 4.6 3.6 3.1 4      Track   Laps 30 30 30 25     Minutes 15 15 15 15     METs 2.63 2.63 2.63 2.36      Home Exercise Plan   Plans to continue exercise at Home (comment)  walking Home (comment)  walking Home (comment)  walking Home (comment)  walking    Frequency Add 2 additional days to program exercise sessions. Add 2 additional days to program exercise sessions. Add 2 additional days to program exercise sessions. Add 2 additional days to program exercise sessions.    Initial Home Exercises Provided 09/22/22 09/22/22 09/22/22 09/22/22      Oxygen   Maintain Oxygen Saturation 88% or higher 88% or higher 88% or higher 88% or higher             Exercise Comments:   Exercise Comments     Row Name 08/04/22 0953           Exercise Comments First full day of exercise!  Patient was oriented to gym and equipment including functions, settings, policies, and  procedures.  Patient's individual exercise prescription and treatment plan were reviewed.  All starting workloads were established based on the results of the 6 minute walk test done at initial orientation visit.  The plan for exercise progression was also introduced and progression will be customized based on patient's performance and goals.                Exercise Goals and Review:   Exercise Goals     Row Name 07/27/22 1431  Exercise Goals   Increase Physical Activity Yes       Intervention Provide advice, education, support and counseling about physical activity/exercise needs.;Develop an individualized exercise prescription for aerobic and resistive training based on initial evaluation findings, risk stratification, comorbidities and participant's personal goals.       Expected Outcomes Long Term: Exercising regularly at least 3-5 days a week.;Short Term: Attend rehab on a regular basis to increase amount of physical activity.;Long Term: Add in home exercise to make exercise part of routine and to increase amount of physical activity.       Increase Strength and Stamina Yes       Intervention Provide advice, education, support and counseling about physical activity/exercise needs.;Develop an individualized exercise prescription for aerobic and resistive training based on initial evaluation findings, risk stratification, comorbidities and participant's personal goals.       Expected Outcomes Short Term: Increase workloads from initial exercise prescription for resistance, speed, and METs.;Short Term: Perform resistance training exercises routinely during rehab and add in resistance training at home;Long Term: Improve cardiorespiratory fitness, muscular endurance and strength as measured by increased METs and functional capacity ( )       Able to understand and use rate of perceived exertion (RPE) scale Yes       Intervention Provide education and explanation on how to use  RPE scale       Expected Outcomes Short Term: Able to use RPE daily in rehab to express subjective intensity level;Long Term:  Able to use RPE to guide intensity level when exercising independently       Able to understand and use Dyspnea scale Yes       Intervention Provide education and explanation on how to use Dyspnea scale       Expected Outcomes Short Term: Able to use Dyspnea scale daily in rehab to express subjective sense of shortness of breath during exertion;Long Term: Able to use Dyspnea scale to guide intensity level when exercising independently       Knowledge and understanding of Target Heart Rate Range (THRR) Yes       Intervention Provide education and explanation of THRR including how the numbers were predicted and where they are located for reference       Expected Outcomes Short Term: Able to state/look up THRR;Long Term: Able to use THRR to govern intensity when exercising independently;Short Term: Able to use daily as guideline for intensity in rehab       Able to check pulse independently Yes       Intervention Provide education and demonstration on how to check pulse in carotid and radial arteries.;Review the importance of being able to check your own pulse for safety during independent exercise       Expected Outcomes Short Term: Able to explain why pulse checking is important during independent exercise;Long Term: Able to check pulse independently and accurately       Understanding of Exercise Prescription Yes       Intervention Provide education, explanation, and written materials on patient's individual exercise prescription       Expected Outcomes Short Term: Able to explain program exercise prescription;Long Term: Able to explain home exercise prescription to exercise independently                Exercise Goals Re-Evaluation :  Exercise Goals Re-Evaluation     Row Name 08/04/22 0956 08/13/22 1245 08/25/22 0824 09/01/22 0925 09/08/22 1417     Exercise Goal  Re-Evaluation  Exercise Goals Review Able to understand and use rate of perceived exertion (RPE) scale;Able to understand and use Dyspnea scale;Knowledge and understanding of Target Heart Rate Range (THRR);Understanding of Exercise Prescription Understanding of Exercise Prescription;Increase Strength and Stamina;Increase Physical Activity Understanding of Exercise Prescription;Increase Strength and Stamina;Increase Physical Activity Understanding of Exercise Prescription;Increase Strength and Stamina;Increase Physical Activity Understanding of Exercise Prescription;Increase Strength and Stamina;Increase Physical Activity   Comments Reviewed RPE  and dyspnea scale, THR and program prescription with pt today.  Pt voiced understanding and was given a copy of goals to take home. Aleyza is off to a good start in rehab.  She has completed her first three full days of exercise thus far.  She is already up to 30 laps!!  We will continue to monitor her progress. Rakiyah has not attended rehab since the last review. We will contact pt and remind her of the importance of good attendance in the program. We will continue to monitor when she returns. Lacrecia feels that she is making good progress in the program. She states that getting sick did set her back a little bit, but she is excited about getting back to her exercise routine. She states that the main benefits she has seen have been improved strength, she has lost some weight, and she feels better overall. She also states that she has been walking at home on her days away from rehab for 45 minutes. She reports walking at home about three days a week. Kymberlee is doing well in rehab as she has returned from being out a bit. As she is getting back into the routine, she was able to walk a full 30 laps around the track and increased her watts on the recumbent bike to 31 watts. Her XR stayed at a level 1 and we will encourage her to inrease it back up. Will continue to monitor.    Expected Outcomes Short: Use RPE daily to regulate intensity. Long: Follow program prescription in THR. Short: Continue to attend rehab regularly Long: Continue to follow program prescription Short: Return to regular attendance in the program. Long: Continue to follow program prescription. Short: Continue to walk on days away from rehab. Long: Continue to improve strength and stamina. Short: Increase level to 2 on the XR Long: Continue to increase overall stamin and MET level    Row Name 09/22/22 0850 09/22/22 0933 10/06/22 1435 10/13/22 0958 10/21/22 1404     Exercise Goal Re-Evaluation   Exercise Goals Review Understanding of Exercise Prescription;Increase Strength and Stamina;Increase Physical Activity Understanding of Exercise Prescription;Increase Strength and Stamina;Increase Physical Activity;Knowledge and understanding of Target Heart Rate Range (THRR);Able to understand and use rate of perceived exertion (RPE) scale;Able to understand and use Dyspnea scale;Able to check pulse independently Increase Physical Activity;Increase Strength and Stamina;Understanding of Exercise Prescription Increase Physical Activity;Increase Strength and Stamina;Understanding of Exercise Prescription Increase Physical Activity;Increase Strength and Stamina;Understanding of Exercise Prescription   Comments Liliana is doing well in rehab. She recently increased her overall average MET level to 2.96 METs. She also improved up to level 4 on the T4 Nustep and back up to level 2 on the XR. She has conistently walked 30 laps on the track as well, and could push for more. We will continue to monitor her progress in the program. Reviewed home exercise with pt today.  Pt plans to walk at home for exercise.  Reviewed THR, pulse, RPE, sign and symptoms, pulse oximetery and when to call 911 or MD.  Also discussed weather  considerations and indoor options.  Pt voiced understanding. [place] continues to do well in rehab. She has stayed pretty  consistent with her workloads on the seated machines such as the T4 Nustep, XR, and Biostep. She also hits consistently walking 30 laps on track and not quite hitting her THR. Staff will encourage patient to start increasing her laps and workloads. Will continue to monitor. Chardonay reports walking at home when not at rehab - for about 20 minutes (RPE 13)(shortness of breath is only slight) 3x/week. Encouraged to continue walking at home. Alysson continues to do well in the program. She has stayed consistent with her workloads on seated machines. She has also continued to walk 30 laps on the track, however, we will encourage her to continue to push for more. She has done well with 3 lb hand weights for resistance training as well. We will continue to monitor her progress in the program.   Expected Outcomes Short: Continue to push for more laps on the track. Long: Continue to improve strength and stamina. Short: Start to add in more walking at home Long: Continue to improve stamina Short: Increase laps on track beyond 30, increase to level 3 on the XR Long: Continue to increase overall MET level and stamina -- Short: Continue to push for more laps beyond 30. Long: Continue to improve strength and stamina.    Row Name 11/17/22 7614 11/18/22 1527 12/01/22 1547         Exercise Goal Re-Evaluation   Exercise Goals Review Increase Physical Activity;Increase Strength and Stamina;Understanding of Exercise Prescription Increase Physical Activity;Increase Strength and Stamina;Understanding of Exercise Prescription Increase Physical Activity;Increase Strength and Stamina;Understanding of Exercise Prescription     Comments Toma continues to  walk at home for about  20 minutes, we encouraged her to increase her duration to 30 minutes for each session, slowly and progressively. She walks about 5 days/ week, She does not normally check her HR or O2 but encouraged her to bring her pulse ox with her to monitor her vitals. Gurtha  is doing well in rehab. She has increased her overall average MET level back up to 2.99 METs. She also has consistently walked 30 laps on the track, and has kept her workloads consistent on all of her seated machines. She also has continued to use 3 lb hand weights for resistance training. We will continue to monitor her progress in the program. Fortune continues to do well in rehab. She completed 25-30 laps on average on the track. She could benefit from increasing her workload on the T4 Nustep beyond level 2. She continues to maintain oxygen saturation above 88%. She did reach over 4 METS on the XR! She is due for her post and we hope to see improvement. Will continue to monitor.     Expected Outcomes Short: Increase duration of exercise to minimum 30 minutes, work up to it Long: Continue to exercise independently at home Short: Progressively increase workloads, push for more laps on track. Long: Continue to improve strength and stamina. Short: Improve on post Long: Continue to increase overall MET level and stamina              Discharge Exercise Prescription (Final Exercise Prescription Changes):  Exercise Prescription Changes - 12/01/22 1500       Response to Exercise   Blood Pressure (Admit) 124/82    Blood Pressure (Exit) 110/68    Heart Rate (Admit) 74 bpm    Heart Rate (Exercise) 99 bpm  Heart Rate (Exit) 85 bpm    Oxygen Saturation (Admit) 96 %    Oxygen Saturation (Exercise) 91 %    Oxygen Saturation (Exit) 92 %    Rating of Perceived Exertion (Exercise) 14    Perceived Dyspnea (Exercise) 1    Symptoms SOB    Duration Continue with 30 min of aerobic exercise without signs/symptoms of physical distress.    Intensity THRR unchanged      Progression   Progression Continue to progress workloads to maintain intensity without signs/symptoms of physical distress.    Average METs 3.04      Resistance Training   Training Prescription Yes    Weight 3 lb    Reps 10-15       Interval Training   Interval Training No      NuStep   Level 2    Minutes 15    METs 2.6      REL-XR   Level 2    Minutes 15    METs 4      Track   Laps 25    Minutes 15    METs 2.36      Home Exercise Plan   Plans to continue exercise at Home (comment)   walking   Frequency Add 2 additional days to program exercise sessions.    Initial Home Exercises Provided 09/22/22      Oxygen   Maintain Oxygen Saturation 88% or higher             Nutrition:  Target Goals: Understanding of nutrition guidelines, daily intake of sodium 1500mg , cholesterol 200mg , calories 30% from fat and 7% or less from saturated fats, daily to have 5 or more servings of fruits and vegetables.  Education: All About Nutrition: -Group instruction provided by verbal, written material, interactive activities, discussions, models, and posters to present general guidelines for heart healthy nutrition including fat, fiber, MyPlate, the role of sodium in heart healthy nutrition, utilization of the nutrition label, and utilization of this knowledge for meal planning. Follow up email sent as well. Written material given at graduation. Flowsheet Row Pulmonary Rehab from 11/26/2022 in [facility] Cardiac and Pulmonary Rehab  Date 11/26/22  Educator [facility]  Instruction Review Code 1- Verbalizes Understanding       Biometrics:  Pre Biometrics - 07/27/22 1431       Pre Biometrics   Height 5\' 7"  (1.702 m)    Weight 175 lb 14.4 oz (79.8 kg)    Waist Circumference 40.5 inches    Hip Circumference 41 inches    Waist to Hip Ratio 0.99 %    BMI (Calculated) 27.54    Single Leg Stand 6.7 seconds              Nutrition Therapy Plan and Nutrition Goals:  Nutrition Therapy & Goals - 07/27/22 0938       Nutrition Therapy   Diet Heart healthy, low Na, pulmonary MNT    Drug/Food Interactions Statins/Certain Fruits    Protein (specify units) 85-95g    Fiber 25 grams    Whole Grain Foods 3 servings     Saturated Fats 12 max. grams    Fruits and Vegetables 8 servings/day    Sodium 2 grams      Personal Nutrition Goals   Nutrition Goal ST: review paperwork, try eating small melas/snacks, try ensure complete LT: meet protein and energy needs, eat a variety of whole foods    Comments 67 y.o. F admitted to pulmonary rehab for  COPD. PMHx includes HTN, HLD, GERD, former tobacco use, former alcohol abuse, macrocytic anemia. Relevant medications includes lipitor, buspirone, zetia, mobic, remeron, MVI with minerals, omeprazole, oxycodone, K+, breztri. Lolitha reports meeting with outpatient RD twice and feels that since her diet is limited as she reports being a "pickey eater" in addition to a reduced appetite. Reviewed what outpatient RD suggested as well as general heart healthy eating and pulmonary MNT. She does not eat until the afternoon/evening at a 3-5pm meal when she gets hungry: pork chop and some macaroni and cheese. She does not like most vegetables, but she enjoys salads (onions and cucumbers and cheese and some egg, pineapple, romaine and iceburg lettuce) and potatoes. She reports not eating a large meal at this time. Keimora reports that she has not tried the reommendations given by outpatient RD as she does not like eating when she is not hunrgy, but is open to trying the premier protein - suggested since she is not eating other meals/snacks at this time she could have ensure complete which would provide the same amount of protein for 350 calories which is closer to a meal replacement. Encouraged that if she begins to eat more meals/snacks that premier protein would be a good option. Encouraged mechanical eating while she is still not feeling hunger cues and suggested snacks with fiber, protein, and fat such as fruit with yogurt or peanut butter. Suggested that her appetite could be lower due to COPD, anemia, and/or prolonged time eating a smaller amount. Jackquline also reports her iron is still low and does  not know what her MD suggested - encouraged her to reach out to her MD to see what they recommend.      Intervention Plan   Intervention Prescribe, educate and counsel regarding individualized specific dietary modifications aiming towards targeted core components such as weight, hypertension, lipid management, diabetes, heart failure and other comorbidities.;Nutrition handout(s) given to patient.    Expected Outcomes Short Term Goal: Understand basic principles of dietary content, such as calories, fat, sodium, cholesterol and nutrients.;Short Term Goal: A plan has been developed with personal nutrition goals set during dietitian appointment.;Long Term Goal: Adherence to prescribed nutrition plan.             Nutrition Assessments:  MEDIFICTS Score Key: ?70 Need to make dietary changes  40-70 Heart Healthy Diet ? 40 Therapeutic Level Cholesterol Diet  Flowsheet Row Pulmonary Rehab from 07/27/2022 in [facility] Cardiac and Pulmonary Rehab  Picture Your Plate Total Score on Admission 40      Picture Your Plate Scores: <40 Unhealthy dietary pattern with much room for improvement. 41-50 Dietary pattern unlikely to meet recommendations for good health and room for improvement. 51-60 More healthful dietary pattern, with some room for improvement.  >60 Healthy dietary pattern, although there may be some specific behaviors that could be improved.   Nutrition Goals Re-Evaluation:  Nutrition Goals Re-Evaluation     Row Name 09/01/22 [phone] 10/13/22 0953 11/17/22 0930 11/24/22 0926       Goals   Current Weight 172 lb 14.4 oz (78.4 kg) -- -- --    Nutrition Goal Tanveer reports that she has continued to work on her portion control by trying to eat smaller meals and snacks. She has also been trying to eat a varietyof whole foods. We will continue to monitor her dietary habits. ST: eat small/frequent meals/snacks that include fiber, fat, and protein, drink ensure daily LT: meet protein and energy  needs, eat a variety of whole  foods ST: eat small/frequent meals/snacks that include fiber, fat, and protein, drink ensure daily LT: meet protein and energy needs, eat a variety of whole foods ST: eat small/frequent meals/snacks that include fiber, fat, and protein, drink ensure daily LT: meet protein and energy needs, eat a variety of whole foods    Comment Short: Continue to try eating smaller meals/snacks. Long: Continue to practice nutritional habits discussed with RD. Devlynn reports doing well with her nutrition, she is more mindful of her food choices, managing her portions, and limiting snacking. Teleshia reports her appetite is still low; likely due in part to COPD as welll as limiting calorie intake for a longer period of time. She reports snacking on pretzels and only having dinner as a meal where she has "a meat and a vegetable", sometimes she will have an ensure in the morning - she reports this is due to appetite, but also a desire to lose weight. Reviewed importance of meeting energy and protein needs and that weight loss due to not meeting these needs is more likely muscle loss. Encouraged small/frequent meals/snacks to avoid getting too full at one time - encouraged to include fiber, fat, and protein at these times: apple and pretzels with peanut butter, 1/2 sandwich with leftover chicken and avocado, pita and veggies with hummus, greek yogurt with nuts and berries, etc. Krissi reports she is drinking Ensure in the morning. more hungry. She does report eating snacks suc has pretzels, which she has added PB. She reports she has a hard time eating a variety of foods due to texture. She does enjoy eating grapes. Her goal is to lose weight but retierated to ensure she is eating enough protein to maintain muscle mass. Disccused with RD who will follow up. Followed up with Flonnie and discussed ways she could try to incorporate a variety of vegetables aside from potatoes - examples include blending vegetables  into sauces like zucchini or peppers or spinach and smashing beans into the potatoes she eats. Also discussed the importance of including vegetables in her diet. Divinity reports having no interest in trying to include any vegetables aside from potatoes into her diet at this time. Encouraged Willetta to include protein rich foods at meal/snacks and eating small/frequent meals to support lean muscle tissue.    Expected Outcome -- ST: eat small/frequent meals/snacks that include fiber, fat, and protein, drink ensure daily LT: meet protein and energy needs, eat a variety of whole foods Short: Meet RD when able to review Long: Continue to eat healthy pulmonary based diet that suits her lifestyle ST: eat small/frequent meals/snacks that include fiber, fat, and protein, drink ensure daily LT: meet protein and energy needs, eat a variety of whole foods             Nutrition Goals Discharge (Final Nutrition Goals Re-Evaluation):  Nutrition Goals Re-Evaluation - 11/24/22 0926       Goals   Nutrition Goal ST: eat small/frequent meals/snacks that include fiber, fat, and protein, drink ensure daily LT: meet protein and energy needs, eat a variety of whole foods    Comment Followed up with Verner and discussed ways she could try to incorporate a variety of vegetables aside from potatoes - examples include blending vegetables into sauces like zucchini or peppers or spinach and smashing beans into the potatoes she eats. Also discussed the importance of including vegetables in her diet. Shirline reports having no interest in trying to include any vegetables aside from potatoes into her diet at this  time. Encouraged Reneka to include protein rich foods at meal/snacks and eating small/frequent meals to support lean muscle tissue.    Expected Outcome ST: eat small/frequent meals/snacks that include fiber, fat, and protein, drink ensure daily LT: meet protein and energy needs, eat a variety of whole foods              Psychosocial: Target Goals: Acknowledge presence or absence of significant depression and/or stress, maximize coping skills, provide positive support system. Participant is able to verbalize types and ability to use techniques and skills needed for reducing stress and depression.   Education: Stress, Anxiety, and Depression - Group verbal and visual presentation to define topics covered.  Reviews how body is impacted by stress, anxiety, and depression.  Also discusses healthy ways to reduce stress and to treat/manage anxiety and depression.  Written material given at graduation. Flowsheet Row Pulmonary Rehab from 11/26/2022 in [facility] Cardiac and Pulmonary Rehab  Date 10/29/22  Educator [facility]  Instruction Review Code 1- [employer] Understanding       Education: Sleep Hygiene -Provides group verbal and written instruction about how sleep can affect your health.  Define sleep hygiene, discuss sleep cycles and impact of sleep habits. Review good sleep hygiene tips.    Initial Review & Psychosocial Screening:  Initial Psych Review & Screening - 07/16/22 0912       Initial Review   Current issues with Current Depression;History of Depression;Current Psychotropic Meds;Current Sleep Concerns;Current Stress Concerns    Source of Stress Concerns Chronic Illness    Comments She has always had some form of depression and takes Buspar for it. Tammey feels  depressed at times but states it does not necessarily stem from something.      Family Dynamics   Good Support System? Yes    Comments She can look to her two sisters for support that live in town.      Barriers   Psychosocial barriers to participate in program The patient should benefit from training in stress management and relaxation.      Screening Interventions   Interventions To provide support and resources with identified psychosocial needs;Encouraged to exercise;Provide feedback about the scores to participant    Expected Outcomes  Short Term goal: Utilizing psychosocial counselor, staff and physician to assist with identification of specific Stressors or current issues interfering with healing process. Setting desired goal for each stressor or current issue identified.;Long Term Goal: Stressors or current issues are controlled or eliminated.;Short Term goal: Identification and review with participant of any Quality of Life or Depression concerns found by scoring the questionnaire.;Long Term goal: The participant improves quality of Life and PHQ9 Scores as seen by post scores and/or verbalization of changes             Quality of Life Scores:  Scores of 19 and below usually indicate a poorer quality of life in these areas.  A difference of  2-3 points is a clinically meaningful difference.  A difference of 2-3 points in the total score of the Quality of Life Index has been associated with significant improvement in overall quality of life, self-image, physical symptoms, and general health in studies assessing change in quality of life.  PHQ-9: Review Flowsheet  More data exists      11/23/2022 11/09/2022 08/13/2022 07/27/2022 05/13/2022  Depression screen PHQ 2/9  Decreased Interest 0 0  0   Down, Depressed, Hopeless 0 0  0   PHQ - 2 Score 0 0  0  Altered sleeping 0 - - 0   Tired, decreased energy 0 - - 0   Change in appetite 0 - - 0   Feeling bad or failure about yourself  0 - - 0   Trouble concentrating 0 - - 0   Moving slowly or fidgety/restless 0 - - 0   Suicidal thoughts 0 - - 0   PHQ-9 Score 0 - - 0   Difficult doing work/chores Not difficult at all - - Not difficult at all     Details       Information is confidential and restricted. Go to Review Flowsheets to unlock data.        Interpretation of Total Score  Total Score Depression Severity:  1-4 = Minimal depression, 5-9 = Mild depression, 10-14 = Moderate depression, 15-19 = Moderately severe depression, 20-27 = Severe depression   Psychosocial  Evaluation and Intervention:  Psychosocial Evaluation - 07/16/22 0917       Psychosocial Evaluation & Interventions   Interventions Stress management education;Relaxation education;Encouraged to exercise with the program and follow exercise prescription    Comments She has always had some form of depression and takes Buspar for it. Korri feels depressed at times but states it does not necessarily stem from something.She can look to her two sisters for support that live in town.    Expected Outcomes Short: Start [facility] to help with mood. Long: Maintain a healthy mental state.    Continue Psychosocial Services  Follow up required by staff             Psychosocial Re-Evaluation:  Psychosocial Re-Evaluation     Row Name 09/01/22 [phone] 09/22/22 0956 10/13/22 0957 11/17/22 9604       Psychosocial Re-Evaluation   Current issues with Current Depression;Current Sleep Concerns None Identified None Identified None Identified;Current Psychotropic Meds    Comments Oliver is still taking Buspar for anxiety and depression. She states that her depressive symptoms have improved since starting the program. She states that for stress relief she likes to play game on her phone, cook, and exercise. She states that she does have a good support system at home made up by her husband and two sisters. She reports that she was having some sleep concerns, but her sleep has improved since starting the program. Patient reports no issues with their current mental states, sleep, stress, depression or anxiety. Will follow up with patient in a few weeks for any changes. Darilyn reports that she has no issues with sleep or anthing that is providing stress; she also denies anxiety and depression. Cicley states she is doing well mentally. She currently takes Buspar and Effexor which she states is doing well for her. She continues to sleep well and uses her oxygen at night as prescribed. She declines any other concerns with stress  or other mental health issues at this time.    Expected Outcomes Short: Continue to attend rehab for stress relief. Long: Maintain positive outlook. Short: Continue to exercise regularly to support mental health and notify staff of any changes. Long: maintain mental health and well being through teaching of rehab or prescribed medications independently. Short: Continue to exercise regularly to support mental health and notify staff of any changes. Long: maintain mental health and well being through teaching of rehab or prescribed medications independently. Short: Continue to exercise for mood boost ans stay compliant with medications Long: Continue to utilize exercise for stress management and maintain positive attitude    Interventions Encouraged to attend  Pulmonary Rehabilitation for the exercise Encouraged to attend Pulmonary Rehabilitation for the exercise Encouraged to attend Pulmonary Rehabilitation for the exercise Encouraged to attend Pulmonary Rehabilitation for the exercise    Continue Psychosocial Services  Follow up required by staff Follow up required by staff Follow up required by staff Follow up required by staff             Psychosocial Discharge (Final Psychosocial Re-Evaluation):  Psychosocial Re-Evaluation - 11/17/22 0937       Psychosocial Re-Evaluation   Current issues with None Identified;Current Psychotropic Meds    Comments Zenda states she is doing well mentally. She currently takes Buspar and Effexor which she states is doing well for her. She continues to sleep well and uses her oxygen at night as prescribed. She declines any other concerns with stress or other mental health issues at this time.    Expected Outcomes Short: Continue to exercise for mood boost ans stay compliant with medications Long: Continue to utilize exercise for stress management and maintain positive attitude    Interventions Encouraged to attend Pulmonary Rehabilitation for the exercise    Continue  Psychosocial Services  Follow up required by staff             Education: Education Goals: Education classes will be provided on a weekly basis, covering required topics. Participant will state understanding/return demonstration of topics presented.  Learning Barriers/Preferences:  Learning Barriers/Preferences - 07/16/22 0911       Learning Barriers/Preferences   Learning Barriers None    Learning Preferences None             General Pulmonary Education Topics:  Infection Prevention: - Provides verbal and written material to individual with discussion of infection control including proper hand washing and proper equipment cleaning during exercise session. Flowsheet Row Pulmonary Rehab from 11/26/2022 in [facility] Cardiac and Pulmonary Rehab  Date 07/16/22  Educator [facility]  Instruction Review Code 1- Verbalizes Understanding       Falls Prevention: - Provides verbal and written material to individual with discussion of falls prevention and safety. Flowsheet Row Pulmonary Rehab from 11/26/2022 in [facility] Cardiac and Pulmonary Rehab  Date 07/16/22  Educator [facility]  Instruction Review Code 1- Verbalizes Understanding       Chronic Lung Disease Review: - Group verbal instruction with posters, models, PowerPoint presentations and videos,  to review new updates, new respiratory medications, new advancements in procedures and treatments. Providing information on websites and "800" numbers for continued self-education. Includes information about supplement oxygen, available portable oxygen systems, continuous and intermittent flow rates, oxygen safety, concentrators, and Medicare reimbursement for oxygen. Explanation of Pulmonary Drugs, including class, frequency, complications, importance of spacers, rinsing mouth after steroid MDI's, and proper cleaning methods for nebulizers. Review of basic lung anatomy and physiology related to function, structure, and complications of lung disease. Review  of risk factors. Discussion about methods for diagnosing sleep apnea and types of masks and machines for OSA. Includes a review of the use of types of environmental controls: home humidity, furnaces, filters, dust mite/pet prevention, HEPA vacuums. Discussion about weather changes, air quality and the benefits of nasal washing. Instruction on Warning signs, infection symptoms, calling MD promptly, preventive modes, and value of vaccinations. Review of effective airway clearance, coughing and/or vibration techniques. Emphasizing that all should Create an Action Plan. Written material given at graduation. Flowsheet Row Pulmonary Rehab from 11/26/2022 in [facility] Cardiac and Pulmonary Rehab  Education need identified 07/27/22  Date 10/22/22  Educator [facility]  Instruction Review Code 1- Verbalizes Understanding       AED/CPR: - Group verbal and written instruction with the use of models to demonstrate the basic use of the AED with the basic ABC's of resuscitation.    Anatomy and Cardiac Procedures: - Group verbal and visual presentation and models provide information about basic cardiac anatomy and function. Reviews the testing methods done to diagnose heart disease and the outcomes of the test results. Describes the treatment choices: Medical Management, Angioplasty, or Coronary Bypass Surgery for treating various heart conditions including Myocardial Infarction, Angina, Valve Disease, and Cardiac Arrhythmias.  Written material given at graduation. Flowsheet Row Pulmonary Rehab from 11/26/2022 in [facility] Cardiac and Pulmonary Rehab  Date 09/24/22  Educator SB  Instruction Review Code 1- Verbalizes Understanding       Medication Safety: - Group verbal and visual instruction to review commonly prescribed medications for heart and lung disease. Reviews the medication, class of the drug, and side effects. Includes the steps to properly store meds and maintain the prescription regimen.  Written material given at  graduation. Flowsheet Row Pulmonary Rehab from 11/26/2022 in [facility] Cardiac and Pulmonary Rehab  Date 10/08/22  Educator [facility]  Instruction Review Code 1- Verbalizes Understanding       Other: -Provides group and verbal instruction on various topics (see comments) Flowsheet Row Pulmonary Rehab from 11/26/2022 in [facility] Cardiac and Pulmonary Rehab  Date 10/01/22  Educator SB  Instruction Review Code 1- Verbalizes Understanding       Knowledge Questionnaire Score:  Knowledge Questionnaire Score - 07/27/22 1432       Knowledge Questionnaire Score   Pre Score 14/18              Core Components/Risk Factors/Patient Goals at Admission:  Personal Goals and Risk Factors at Admission - 07/27/22 1433       Core Components/Risk Factors/Patient Goals on Admission    Weight Management Yes;Weight Loss    Intervention Weight Management: Develop a combined nutrition and exercise program designed to reach desired caloric intake, while maintaining appropriate intake of nutrient and fiber, sodium and fats, and appropriate energy expenditure required for the weight goal.;Weight Management: Provide education and appropriate resources to help participant work on and attain dietary goals.;Weight Management/Obesity: Establish reasonable short term and long term weight goals.    Admit Weight 175 lb 14.4 oz (79.8 kg)    Goal Weight: Short Term 170 lb (77.1 kg)    Goal Weight: Long Term 165 lb (74.8 kg)    Expected Outcomes Long Term: Adherence to nutrition and physical activity/exercise program aimed toward attainment of established weight goal;Short Term: Continue to assess and modify interventions until short term weight is achieved;Weight Loss: Understanding of general recommendations for a balanced deficit meal plan, which promotes 1-2 lb weight loss per week and includes a negative energy balance of [phone] kcal/d;Understanding of distribution of calorie intake throughout the day with the consumption of  4-5 meals/snacks;Understanding recommendations for meals to include 15-35% energy as protein, 25-35% energy from fat, 35-60% energy from carbohydrates, less than 200mg  of dietary cholesterol, 20-35 gm of total fiber daily    Improve shortness of breath with ADL's Yes    Intervention Provide education, individualized exercise plan and daily activity instruction to help decrease symptoms of SOB with activities of daily living.    Expected Outcomes Short Term: Improve cardiorespiratory fitness to achieve a reduction of symptoms when performing ADLs;Long Term: Be able to perform more ADLs without symptoms or delay the  onset of symptoms    Increase knowledge of respiratory medications and ability to use respiratory devices properly  Yes    Intervention Provide education and demonstration as needed of appropriate use of medications, inhalers, and oxygen therapy.    Expected Outcomes Short Term: Achieves understanding of medications use. Understands that oxygen is a medication prescribed by physician. Demonstrates appropriate use of inhaler and oxygen therapy.;Long Term: Maintain appropriate use of medications, inhalers, and oxygen therapy.    Hypertension Yes    Intervention Provide education on lifestyle modifcations including regular physical activity/exercise, weight management, moderate sodium restriction and increased consumption of fresh fruit, vegetables, and low fat dairy, alcohol moderation, and smoking cessation.;Monitor prescription use compliance.    Expected Outcomes Short Term: Continued assessment and intervention until BP is < 140/67mm HG in hypertensive participants. < 130/29mm HG in hypertensive participants with diabetes, heart failure or chronic kidney disease.;Long Term: Maintenance of blood pressure at goal levels.    Lipids Yes    Intervention Provide education and support for participant on nutrition & aerobic/resistive exercise along with prescribed medications to achieve LDL 70mg , HDL  >40mg .    Expected Outcomes Short Term: Participant states understanding of desired cholesterol values and is compliant with medications prescribed. Participant is following exercise prescription and nutrition guidelines.;Long Term: Cholesterol controlled with medications as prescribed, with individualized exercise RX and with personalized nutrition plan. Value goals: LDL < 70mg , HDL > 40 mg.             Education:Diabetes - Individual verbal and written instruction to review signs/symptoms of diabetes, desired ranges of glucose level fasting, after meals and with exercise. Acknowledge that pre and post exercise glucose checks will be done for 3 sessions at entry of program.   Know Your Numbers and Heart Failure: - Group verbal and visual instruction to discuss disease risk factors for cardiac and pulmonary disease and treatment options.  Reviews associated critical values for Overweight/Obesity, Hypertension, Cholesterol, and Diabetes.  Discusses basics of heart failure: signs/symptoms and treatments.  Introduces Heart Failure Zone chart for action plan for heart failure.  Written material given at graduation. Flowsheet Row Pulmonary Rehab from 11/26/2022 in [facility] Cardiac and Pulmonary Rehab  Date 08/06/22  Educator SB  Instruction Review Code 1- Verbalizes Understanding       Core Components/Risk Factors/Patient Goals Review:   Goals and Risk Factor Review     Row Name 09/01/22 [phone] 09/22/22 0955 10/13/22 0948 11/17/22 0934       Core Components/Risk Factors/Patient Goals Review   Personal Goals Review Weight Management/Obesity;Hypertension Improve shortness of breath with ADL's Improve shortness of breath with ADL's;Hypertension Improve shortness of breath with ADL's;Hypertension    Review Joyel reports that she has lost some weight as she is down from 175 lb to 172.9 lb. She is still working toward her long term weight goal of 165 lb. She has been checking her BP at home and reports  that they have been around 130/78. She was encouraged to continue to check her BP at home. Spoke to patient about their shortness of breath and what they can do to improve. Patient has been informed of breathing techniques when starting the program. Patient is informed to tell staff if they have had any med changes and that certain meds they are taking or not taking can be causing shortness of breath. Liley reports that her shortness of breath has been improving since starting at rehab. Sruti reports not checking her BP at home unless she has  issues, but has a cuff at home. Encouraged to check BP at home at least 2x/week to get into the habit of checking at home. Today in rehab her resting BP was 132/80. Simone feels liker her breathing is not as wheezy as she used to be, and feels like her endurance has improved. She is  limited with hips and back pain when walking but able to manage OK. She is supposed to get an epidural soon which she hopes will allieviate from pain. She continues PLB but her allergies are effecting her significantly right now. She is taking medications OK. BP at home is being checked routinely and ranges around 130s/80s. Rehab they are around the same.    Expected Outcomes Short: Continue to check BP at home. Long: Continue to work toward long term weight goal. Short: Attend [facility] regularly to improve shortness of breath with ADL's. Long: maintain independence with ADL's Short: Attend [facility] regularly to improve shortness of breath with ADL's. Long: maintain independence with ADL's Short: Continue to monitor BP and get through allergy season Long: Continue to manage lifestyle risk factors             Core Components/Risk Factors/Patient Goals at Discharge (Final Review):   Goals and Risk Factor Review - 11/17/22 0934       Core Components/Risk Factors/Patient Goals Review   Personal Goals Review Improve shortness of breath with ADL's;Hypertension    Review Stavroula feels liker  her breathing is not as wheezy as she used to be, and feels like her endurance has improved. She is  limited with hips and back pain when walking but able to manage OK. She is supposed to get an epidural soon which she hopes will allieviate from pain. She continues PLB but her allergies are effecting her significantly right now. She is taking medications OK. BP at home is being checked routinely and ranges around 130s/80s. Rehab they are around the same.    Expected Outcomes Short: Continue to monitor BP and get through allergy season Long: Continue to manage lifestyle risk factors             ITP Comments:  ITP Comments     Row Name 07/16/22 0909 07/27/22 1425 08/04/22 0953 08/12/22 0956 09/09/22 0904   ITP Comments Virtual Visit completed. Patient informed on EP and RD appointment and 6 Minute walk test. Patient also informed of patient health questionnaires on My Chart. Patient Verbalizes understanding. Visit diagnosis can be found in [facility] 07/07/2022. Completed and gym orientation. Initial ITP created and sent for review to Dr. Jinny Sanders, Medical Director. First full day of exercise!  Patient was oriented to gym and equipment including functions, settings, policies, and procedures.  Patient's individual exercise prescription and treatment plan were reviewed.  All starting workloads were established based on the results of the 6 minute walk test done at initial orientation visit.  The plan for exercise progression was also introduced and progression will be customized based on patient's performance and goals. 30 Day review completed. Medical Director ITP review done, changes made as directed, and signed approval by Medical Director.    new to program 30 Day review completed. Medical Director ITP review done, changes made as directed, and signed approval by Medical Director.    Row Name 10/07/22 1212 11/04/22 0802 12/02/22 0819       ITP Comments 30 day review completed. ITP sent to Dr.  Jinny Sanders, Medical Director of  Pulmonary Rehab. Continue with ITP unless changes  are made by physician. 30 Day review completed. Medical Director ITP review done, changes made as directed, and signed approval by Medical Director. 30 day review completed. ITP sent to Dr. Jinny Sanders, Medical Director of  Pulmonary Rehab. Continue with ITP unless changes are made by physician.              Comments: 30 day review

## 2022-12-03 ENCOUNTER — Encounter: Payer: Medicare Other | Admitting: *Deleted

## 2022-12-03 DIAGNOSIS — J449 Chronic obstructive pulmonary disease, unspecified: Secondary | ICD-10-CM | POA: Diagnosis not present

## 2022-12-03 NOTE — Progress Notes (Signed)
Daily Session Note  Patient Details  Name: Gabrielle Pineda MRN: [id number] Date of Birth: 1956-02-21 Referring Provider:   Flowsheet Row Pulmonary Rehab from 07/27/2022 in [facility] Cardiac and Pulmonary Rehab  Referring Provider Sarina Ser MD       Encounter Date: 12/03/2022  Check In:  Session Check In - 12/03/22 0952       Check-In   Supervising physician immediately available to respond to emergencies See telemetry face sheet for immediately available ER MD    Location [facility]-Cardiac & Pulmonary Rehab    Staff Present Lanny Hurst, RN, ADN;Jessica Juanetta Gosling, MA, RCEP, CCRP, CCET;Noah Tickle, BS, Exercise Physiologist    Virtual Visit No    Medication changes reported     No    Fall or balance concerns reported    No    Warm-up and Cool-down Performed on first and last piece of equipment    Resistance Training Performed Yes    VAD Patient? No    PAD/SET Patient? No      Pain Assessment   Currently in Pain? No/denies                Social History   Tobacco Use  Smoking Status Former   Packs/day: 2.00   Years: 45.00   Additional pack years: 0.00   Total pack years: 90.00   Types: Cigarettes   Quit date: 10/28/2020   Years since quitting: 2.0  Smokeless Tobacco Never  Tobacco Comments   Ive been quit for about a year now.    Goals Met:  Independence with exercise equipment Exercise tolerated well No report of concerns or symptoms today Strength training completed today  Goals Unmet:  Not Applicable  Comments: Pt able to follow exercise prescription today without complaint.  Will continue to monitor for progression.    Dr. Bethann Punches is Medical Director for [facility] Cardiac Rehabilitation.  Dr. Vida Rigger is Medical Director for [facility] Pulmonary Rehabilitation.

## 2022-12-04 ENCOUNTER — Encounter: Payer: Self-pay | Admitting: Pulmonary Disease

## 2022-12-06 DIAGNOSIS — J449 Chronic obstructive pulmonary disease, unspecified: Secondary | ICD-10-CM | POA: Diagnosis not present

## 2022-12-07 ENCOUNTER — Telehealth: Payer: Self-pay

## 2022-12-07 ENCOUNTER — Encounter: Payer: Self-pay | Admitting: Pulmonary Disease

## 2022-12-07 NOTE — Telephone Encounter (Signed)
Pt lmovm requesting a call back regarding to schedule her procedure

## 2022-12-07 NOTE — Telephone Encounter (Signed)
Gabrielle Iba, MD  [name], CMA Clearance given on cardiology visit December 01, 2022 Acceptable risk for procedure, no further cardiac testing needed   Pulmonary clearance still pending.   Thanks, [place], [place]

## 2022-12-07 NOTE — Telephone Encounter (Signed)
Patient has been informed that we are almost ready to schedule her colonoscopy.  Cardiac Clearance has been received however pulmonary clearance is still pending.  Dr. Marcos Eke wanted to wait until patient has had her office visit with on 01/05/23 to recheck her.  Thanks,  [place], [place]

## 2022-12-08 ENCOUNTER — Other Ambulatory Visit: Payer: Self-pay | Admitting: Physician Assistant

## 2022-12-08 ENCOUNTER — Encounter: Payer: Medicare Other | Admitting: *Deleted

## 2022-12-08 VITALS — Ht 67.0 in | Wt 174.4 lb

## 2022-12-08 DIAGNOSIS — J449 Chronic obstructive pulmonary disease, unspecified: Secondary | ICD-10-CM | POA: Diagnosis not present

## 2022-12-08 DIAGNOSIS — R339 Retention of urine, unspecified: Secondary | ICD-10-CM

## 2022-12-08 NOTE — Patient Instructions (Signed)
Discharge Patient Instructions  Patient Details  Name: Gabrielle Pineda MRN: [id number] Date of Birth: 24-Dec-1955 Referring Provider:  [name], PA-C   Number of Visits: 53  Reason for Discharge:  Patient reached a stable level of exercise. Patient independent in their exercise. Patient has met program and personal goals.  Smoking History:  Social History   Tobacco Use  Smoking Status Former   Packs/day: 2.00   Years: 45.00   Additional pack years: 0.00   Total pack years: 90.00   Types: Cigarettes   Quit date: 10/28/2020   Years since quitting: 2.1  Smokeless Tobacco Never  Tobacco Comments   Ive been quit for about a year now.    Diagnosis:  Chronic obstructive pulmonary disease, unspecified COPD type  Initial Exercise Prescription:  Initial Exercise Prescription - 07/27/22 1400       Date of Initial Exercise RX and Referring Provider   Date 07/27/22    Referring Provider Sarina Ser MD      Oxygen   Maintain Oxygen Saturation 88% or higher      Recumbant Bike   Level 2    RPM 50    Watts 30    Minutes 15    METs 3      NuStep   Level 3    SPM 80    Minutes 15    METs 3      REL-XR   Level 2    Speed 50    Minutes 15    METs 3      Track   Laps 27    Minutes 15    METs 2.47      Prescription Details   Frequency (times per week) 2    Duration Progress to 30 minutes of continuous aerobic without signs/symptoms of physical distress      Intensity   THRR 40-80% of Max Heartrate 104-138    Ratings of Perceived Exertion 11-13    Perceived Dyspnea 0-4      Progression   Progression Continue to progress workloads to maintain intensity without signs/symptoms of physical distress.      Resistance Training   Training Prescription Yes    Weight 3 lb    Reps 10-15             Discharge Exercise Prescription (Final Exercise Prescription Changes):  Exercise Prescription Changes - 12/01/22 1500       Response to Exercise    Blood Pressure (Admit) 124/82    Blood Pressure (Exit) 110/68    Heart Rate (Admit) 74 bpm    Heart Rate (Exercise) 99 bpm    Heart Rate (Exit) 85 bpm    Oxygen Saturation (Admit) 96 %    Oxygen Saturation (Exercise) 91 %    Oxygen Saturation (Exit) 92 %    Rating of Perceived Exertion (Exercise) 14    Perceived Dyspnea (Exercise) 1    Symptoms SOB    Duration Continue with 30 min of aerobic exercise without signs/symptoms of physical distress.    Intensity THRR unchanged      Progression   Progression Continue to progress workloads to maintain intensity without signs/symptoms of physical distress.    Average METs 3.04      Resistance Training   Training Prescription Yes    Weight 3 lb    Reps 10-15      Interval Training   Interval Training No      NuStep   Level 2  Minutes 15    METs 2.6      REL-XR   Level 2    Minutes 15    METs 4      Track   Laps 25    Minutes 15    METs 2.36      Home Exercise Plan   Plans to continue exercise at Home (comment)   walking   Frequency Add 2 additional days to program exercise sessions.    Initial Home Exercises Provided 09/22/22      Oxygen   Maintain Oxygen Saturation 88% or higher             Functional Capacity:  6 Minute Walk     Row Name 07/27/22 1425 12/08/22 1032       6 Minute Walk   Phase Initial Discharge    Distance 1035 feet 1215 feet    Distance % Change -- 17.4 %    Distance Feet Change -- 180 ft    Walk Time 6 minutes 6 minutes    # of Rest Breaks 0 0    MPH 1.96 2.3    METS 3.24 3.54    RPE 14 13    Perceived Dyspnea  2 1    VO2 Peak 11.33 12.41    Symptoms Yes (comment) Yes (comment)    Comments Hip pain 6/10 SOB    Resting HR 70 bpm 75 bpm    Resting BP 126/70 146/70    Resting Oxygen Saturation  94 % 97 %    Exercise Oxygen Saturation  during 6 min walk 92 % 95 %    Max Ex. HR 110 bpm 110 bpm    Max Ex. BP 184/74 182/76    2 Minute Post BP 154/72 140/74      Interval HR    1 Minute HR 96 95    2 Minute HR 100 105    3 Minute HR 99 108    4 Minute HR 97 109    5 Minute HR 103 108    6 Minute HR 110 110    2 Minute Post HR 77 77    Interval Heart Rate? Yes Yes      Interval Oxygen   Interval Oxygen? Yes Yes    Baseline Oxygen Saturation % 94 % 97 %    1 Minute Oxygen Saturation % 93 % 96 %    1 Minute Liters of Oxygen 0 L  Room Air 0 L  Room Air    2 Minute Oxygen Saturation % 92 % 95 %    2 Minute Liters of Oxygen 0 L 0 L    3 Minute Oxygen Saturation % 93 % 95 %    3 Minute Liters of Oxygen 0 L 0 L    4 Minute Oxygen Saturation % 93 % 95 %    4 Minute Liters of Oxygen 0 L 0 L    5 Minute Oxygen Saturation % 93 % 95 %    5 Minute Liters of Oxygen 0 L 0 L    6 Minute Oxygen Saturation % 93 % 95 %    6 Minute Liters of Oxygen 0 L 0 L    2 Minute Post Oxygen Saturation % 94 % 99 %    2 Minute Post Liters of Oxygen 0 L 0 L            Nutrition & Weight - Outcomes:  Pre Biometrics - 07/27/22 1431  Pre Biometrics   Height 5\' 7"  (1.702 m)    Weight 175 lb 14.4 oz (79.8 kg)    Waist Circumference 40.5 inches    Hip Circumference 41 inches    Waist to Hip Ratio 0.99 %    BMI (Calculated) 27.54    Single Leg Stand 6.7 seconds             Post Biometrics - 12/08/22 1045        Post  Biometrics   Height 5\' 7"  (1.702 m)    Weight 174 lb 6.4 oz (79.1 kg)    Waist Circumference 44 inches    Hip Circumference 38.5 inches    Waist to Hip Ratio 1.14 %    BMI (Calculated) 27.31    Single Leg Stand 2.5 seconds             Nutrition:  Nutrition Therapy & Goals - 07/27/22 0938       Nutrition Therapy   Diet Heart healthy, low Na, pulmonary MNT    Drug/Food Interactions Statins/Certain Fruits    Protein (specify units) 85-95g    Fiber 25 grams    Whole Grain Foods 3 servings    Saturated Fats 12 max. grams    Fruits and Vegetables 8 servings/day    Sodium 2 grams      Personal Nutrition Goals   Nutrition Goal ST: review  paperwork, try eating small melas/snacks, try ensure complete LT: meet protein and energy needs, eat a variety of whole foods    Comments 67 y.o. F admitted to pulmonary rehab for COPD. PMHx includes HTN, HLD, GERD, former tobacco use, former alcohol abuse, macrocytic anemia. Relevant medications includes lipitor, buspirone, zetia, mobic, remeron, MVI with minerals, omeprazole, oxycodone, K+, breztri. Exie reports meeting with outpatient RD twice and feels that since her diet is limited as she reports being a "pickey eater" in addition to a reduced appetite. Reviewed what outpatient RD suggested as well as general heart healthy eating and pulmonary MNT. She does not eat until the afternoon/evening at a 3-5pm meal when she gets hungry: pork chop and some macaroni and cheese. She does not like most vegetables, but she enjoys salads (onions and cucumbers and cheese and some egg, pineapple, romaine and iceburg lettuce) and potatoes. She reports not eating a large meal at this time. Mackenize reports that she has not tried the reommendations given by outpatient RD as she does not like eating when she is not hunrgy, but is open to trying the premier protein - suggested since she is not eating other meals/snacks at this time she could have ensure complete which would provide the same amount of protein for 350 calories which is closer to a meal replacement. Encouraged that if she begins to eat more meals/snacks that premier protein would be a good option. Encouraged mechanical eating while she is still not feeling hunger cues and suggested snacks with fiber, protein, and fat such as fruit with yogurt or peanut butter. Suggested that her appetite could be lower due to COPD, anemia, and/or prolonged time eating a smaller amount. Endiya also reports her iron is still low and does not know what her MD suggested - encouraged her to reach out to her MD to see what they recommend.      Intervention Plan   Intervention Prescribe,  educate and counsel regarding individualized specific dietary modifications aiming towards targeted core components such as weight, hypertension, lipid management, diabetes, heart failure and other comorbidities.;Nutrition handout(s) given to  patient.    Expected Outcomes Short Term Goal: Understand basic principles of dietary content, such as calories, fat, sodium, cholesterol and nutrients.;Short Term Goal: A plan has been developed with personal nutrition goals set during dietitian appointment.;Long Term Goal: Adherence to prescribed nutrition plan.           Goals reviewed with patient; copy given to patient.

## 2022-12-08 NOTE — Progress Notes (Signed)
Daily Session Note  Patient Details  Name: Gabrielle Pineda MRN: [id number] Date of Birth: 1956/05/27 Referring Provider:   Flowsheet Row Pulmonary Rehab from 07/27/2022 in [facility]  Referring Provider Sarina Ser MD       Encounter Date: 12/08/2022  Check In:  Session Check In - 12/08/22 0930       Check-In   Supervising physician immediately available to respond to emergencies See telemetry face sheet for immediately available ER MD    Location [facility]-Cardiac & Pulmonary [facility]    Staff Present Lanny Hurst, RN, ADN;Jessica Juanetta Gosling, MA, RCEP, CCRP, Zackery Barefoot, MS, ACSM CEP, Exercise Physiologist    Virtual Visit No    Medication changes reported     No    Fall or balance concerns reported    No    Warm-up and Cool-down Performed on first and last piece of equipment    Resistance Training Performed Yes    VAD Patient? No    PAD/SET Patient? No      Pain Assessment   Currently in Pain? No/denies                Social History   Tobacco Use  Smoking Status Former   Packs/day: 2.00   Years: 45.00   Additional pack years: 0.00   Total pack years: 90.00   Types: Cigarettes   Quit date: 10/28/2020   Years since quitting: 2.1  Smokeless Tobacco Never  Tobacco Comments   Ive been quit for about a year now.    Goals Met:  Independence with exercise equipment Exercise tolerated well No report of concerns or symptoms today Strength training completed today  Goals Unmet:  Not Applicable  Comments: Pt able to follow exercise prescription today without complaint.  Will continue to monitor for progression.    Dr. Bethann Punches is Medical Director for [facility] Cardiac Rehabilitation.  Dr. Vida Rigger is Medical Director for [facility] Pulmonary [facility].

## 2022-12-09 ENCOUNTER — Ambulatory Visit: Admit: 2022-12-09 | Payer: Medicare Other | Admitting: Gastroenterology

## 2022-12-09 SURGERY — COLONOSCOPY WITH PROPOFOL
Anesthesia: General

## 2022-12-10 ENCOUNTER — Encounter: Payer: Medicare Other | Admitting: *Deleted

## 2022-12-10 ENCOUNTER — Telehealth: Payer: Self-pay

## 2022-12-10 ENCOUNTER — Telehealth: Payer: Medicare Other | Admitting: Psychiatry

## 2022-12-10 ENCOUNTER — Encounter: Payer: Self-pay | Admitting: Psychiatry

## 2022-12-10 DIAGNOSIS — F411 Generalized anxiety disorder: Secondary | ICD-10-CM | POA: Diagnosis not present

## 2022-12-10 DIAGNOSIS — F1021 Alcohol dependence, in remission: Secondary | ICD-10-CM | POA: Diagnosis not present

## 2022-12-10 DIAGNOSIS — F5101 Primary insomnia: Secondary | ICD-10-CM

## 2022-12-10 DIAGNOSIS — F3342 Major depressive disorder, recurrent, in full remission: Secondary | ICD-10-CM

## 2022-12-10 DIAGNOSIS — J449 Chronic obstructive pulmonary disease, unspecified: Secondary | ICD-10-CM

## 2022-12-10 MED ORDER — ARIPIPRAZOLE 2 MG PO TABS
2.0000 mg | ORAL_TABLET | Freq: Every day | ORAL | 1 refills | Status: DC
Start: 2022-12-10 — End: 2023-01-11

## 2022-12-10 NOTE — Progress Notes (Signed)
Care Management & Coordination Services Pharmacy Team Pharmacy Assistant   Name: Gabrielle Pineda  MRN: [id number] DOB: Apr 18, 1956  Reason for Encounter: Medication Coordination and Delivery for [facility]  Contacted patient to discuss medications and coordinate delivery from [facility]. Spoke with patient on 12/10/2022   Chart review: Recent office visits:  11/23/2022 [name], PA- C (PCP Office Visit) for Annual Physical- Started: Furosemide 20 mg daily, Ipratropium Bromide 0.03% 2 sprays each nare q12h, Losartan Potassium 25 mg daily, Stopped: Cetrizine HCl 10 mg daily, Lab orders placed, Mammogram order placed, Referral to Gastroenterology placed, Referral to Cardiology placed, BP: 148/88, Patient to follow-up in 6 weeks.  Recent consult visits:  12/08/2022 Lanny Hurst, RN (Pulmonary Rehab) No medication changes noted, No orders placed,   12/03/2022 Lanny Hurst, RN (Pulmonary Rehab) No medication changes noted, No orders placed,   12/01/2022 Cadence Lorna Few (Cardiology) for Follow-up- No medication changes noted, EKG order placed, BP: 148/88, Patient to follow-up in 1 year  12/01/2022 Cyndia Diver, RN (Pulmonary Rehab) No medication changes noted, No orders placed,   11/26/2022 Lanny Hurst, RN (Pulmonary Rehab) No medication changes noted, No orders placed,   11/25/2022 Sarina Ser, MD (Pulmonary) for Follow-up- Started: Methylprednisolone 4 mg, Lab orders placed,  Patient to follow-up in 4-6 weeks.  11/24/2022 Cyndia Diver, RN (Pulmonary Rehab) No medication changes noted, No orders placed,   11/17/2022 Susann Givens, RN (Pulmonary Rehab) No medication changes noted, No orders placed,   Hospital visits:  None in previous 6 months  Medications: Outpatient Encounter Medications as of 12/10/2022  Medication Sig   acyclovir ointment (ZOVIRAX) 5 % :Topical Every 3 Hours as needed for lesion   albuterol (PROVENTIL) (2.5 MG/3ML) 0.083%  nebulizer solution Take 3 mLs (2.5 mg total) by nebulization every 4 (four) hours as needed for wheezing or shortness of breath.   albuterol (VENTOLIN HFA) 108 (90 Base) MCG/ACT inhaler INHALE TWO PUFFS BY MOUTH INTO LUNGS every SIX hours AS NEEDED FOR WHEEZING AND/OR SHORTNESS OF BREATH   ARIPiprazole (ABILIFY) 5 MG tablet TAKE ONE TABLET BY MOUTH EVERY MORNING   atorvastatin (LIPITOR) 40 MG tablet TAKE ONE TABLET BY MOUTH EVERY MORNING   Budeson-Glycopyrrol-Formoterol (BREZTRI AEROSPHERE) 160-9-4.8 MCG/ACT AERO Inhale 2 puffs into the lungs in the morning and at bedtime.   carvedilol (COREG) 12.5 MG tablet TAKE ONE TABLET BY MOUTH EVERY MORNING and TAKE ONE TABLET BY MOUTH EVERYDAY AT BEDTIME   dextromethorphan-guaiFENesin (MUCINEX DM) 30-600 MG 12hr tablet Take 1 tablet by mouth 2 (two) times daily.   ezetimibe (ZETIA) 10 MG tablet TAKE ONE TABLET BY MOUTH EVERY MORNING   fexofenadine (ALLEGRA) 180 MG tablet Take 180 mg by mouth daily.   furosemide (LASIX) 20 MG tablet Take 1 tablet (20 mg total) by mouth daily.   gabapentin (NEURONTIN) 400 MG capsule Take 1 capsule (400 mg total) by mouth 2 (two) times daily.   ipratropium (ATROVENT) 0.03 % nasal spray Place 2 sprays into both nostrils every 12 (twelve) hours.   leflunomide (ARAVA) 20 MG tablet Take 20 mg by mouth at bedtime.   LINZESS 145 MCG CAPS capsule Take 145 mcg by mouth daily before breakfast.   losartan (COZAAR) 25 MG tablet Take 1 tablet (25 mg total) by mouth daily.   meloxicam (MOBIC) 15 MG tablet Take 15 mg by mouth at bedtime.   methylPREDNISolone (MEDROL DOSEPAK) 4 MG TBPK tablet Take as directed in the package. (Patient not taking: Reported on 12/01/2022)   mirtazapine (REMERON)  15 MG tablet TAKE ONE TABLET BY MOUTH EVERYDAY AT BEDTIME   montelukast (SINGULAIR) 10 MG tablet TAKE ONE TABLET BY MOUTH EVERYDAY AT BEDTIME   Multiple Vitamin (MULTIVITAMIN WITH MINERALS) TABS tablet Take 1 tablet by mouth daily.   Nebulizer MISC  Compressor nebulizer to use with nebulized medications as instructed   omeprazole (PRILOSEC) 40 MG capsule TAKE ONE CAPSULE BY MOUTH EVERY MORNING and TAKE ONE CAPSULE BY MOUTH EVERYDAY AT BEDTIME   oxyCODONE-acetaminophen (PERCOCET) 5-325 MG tablet Take 1 tablet by mouth every 8 (eight) hours as needed for severe pain. Must last 30 days.   [START ON 12/29/2022] oxyCODONE-acetaminophen (PERCOCET) 5-325 MG tablet Take 1 tablet by mouth every 8 (eight) hours as needed for severe pain. Must last 30 days.   Oxymetazoline HCl (MUCINEX NASAL SPRAY MOISTURE NA) Place into the nose.   potassium chloride SA (KLOR-CON M) 20 MEQ tablet TAKE ONE TABLET BY MOUTH EVERY MORNING   SPIKEVAX syringe  (Patient not taking: Reported on 12/01/2022)   topiramate (TOPAMAX) 25 MG tablet Take 1 tablet (25 mg total) by mouth 2 (two) times daily.   valACYclovir (VALTREX) 1000 MG tablet TAKE TWO TABLETS BY MOUTH TWICE DAILY AS NEEDED FOR 5 DAYS FOR FEVER blisters   venlafaxine XR (EFFEXOR-XR) 75 MG 24 hr capsule TAKE THREE CAPSULES BY MOUTH EVERY MORNING   XIIDRA 5 % SOLN Place 1 drop into both eyes daily.   No facility-administered encounter medications on file as of 12/10/2022.   BP Readings from Last 3 Encounters:  12/01/22 (!) 148/88  11/25/22 124/80  11/23/22 (!) 148/88    Pulse Readings from Last 3 Encounters:  12/01/22 74  11/25/22 78  11/23/22 78    Lab Results  Component Value Date/Time   HGBA1C 5.3 11/23/2022 10:27 AM   HGBA1C 5.3 06/26/2021 09:13 AM   Lab Results  Component Value Date   CREATININE 1.00 09/11/2022   BUN 11 09/11/2022   GFRNONAA >60 09/11/2022   GFRAA 72 05/06/2020   NA 139 09/11/2022   K 4.1 09/11/2022   CALCIUM 8.8 (L) 09/11/2022   CO2 22 09/11/2022   Cycle dispensing form sent to Angelena Sole, CPP for review.   Last adherence delivery date: 11/24/2022 2nd Route      Patient is due for next adherence delivery on: 12/23/2022  This delivery to include: Adherence Packaging  30  Days  Meloxicam 15 mg tablet- Take one tablet by mouth once daily (Bedtime) Ezetimibe (Zetia) 10 mg tablet Take one tablet by mouth daily (Breakfast) Atorvastatin 40 mg tablet- Take one tablet by mouth daily (Breakfast) Carvedilol 12.5 mg tablet - Take on tablet by mouth twice daily (Breakfast, Bedtime) Mirtazapine 15 mg tablet-Take one tablet daily (Bedtime) Topiramate 25 mg tablet-Take one tablet by mouth twice daily (Breakfast, Bedtime) Gabapentin 400 mg tablet- Take one tablet by mouth twice daily (Breakfast, Bedtime) Venlafaxine ER 75 mg capsule- Take three capsules by mouth daily (Breakfast) Omeprazole 40 mg- Take one capsule by mouth two times a day (Breakfast, Bedtime) Potassium 20 mEq - Take one capsule daily (Breakfast) Aripiprazole 5 mg tablet- Take one tablet by mouth daily (Breakfast) Leflunomide 20 mg tablet- Take one tablet by mouth once daily (Bedtime) Montelukast (Singulair) 10 mg tablet 1 tablet daily (Bedtime) Ipratropium Spray 0.03% 2 sprays in both nostrils q12h.  Losartan 25 mg 1 tablet daily (Bedtime)  Patient declined the following medications this month: Furosemide 20 mg 1 tablet daily (Breakfast) (PRN)   Refills requested from providers include: Leflunomide  20 mg (specialist medication) [facility] will send over the refill request  Confirmed delivery date of 12/23/2022 2nd Route, advised patient that pharmacy will contact them the morning of delivery.  Any concerns about your medications? No  How often do you forget or accidentally miss a dose? Never  Do you use a pillbox? No  Is patient in packaging Yes   Recent blood pressure readings are as follows: 04/16 148/88   Patient has follow-up with CPP on 01/26/2023 @ 1100.  Adelene Idler, CPA/CMA Clinical Pharmacist Assistant Phone: [phone]

## 2022-12-10 NOTE — Progress Notes (Signed)
Virtual Visit via Video Note  I connected with Gabrielle Pineda on 12/10/22 at  1:00 PM EDT by a video enabled telemedicine application and verified that I am speaking with the correct person using two identifiers.  Location Provider Location : [facility] Patient Location : Home  Participants: Patient , Provider    I discussed the limitations of evaluation and management by telemedicine and the availability of in person appointments. The patient expressed understanding and agreed to proceed.   I discussed the assessment and treatment plan with the patient. The patient was provided an opportunity to ask questions and all were answered. The patient agreed with the plan and demonstrated an understanding of the instructions.   The patient was advised to call back or seek an in-person evaluation if the symptoms worsen or if the condition fails to improve as anticipated.   BH MD  OP Progress Note  12/11/2022 10:57 AM Gabrielle Pineda  MRN:  [id number]  Chief Complaint:  Chief Complaint  Patient presents with   Follow-up   Anxiety   Depression   Medication Refill   HPI: Gabrielle Pineda is a 67 year old Caucasian female on disability, lives in [place], has a history of primary insomnia, MDD, GAD, alcohol use disorder in remission, history of back pain, history of spinal cord stimulator, paroxysmal ventricular tachycardia, hypercholesterolemia, chronic pain syndrome, GERD was evaluated by telemedicine today.  Patient today reports she is currently doing overall well with regards to her mood.  Denies any significant concerns.  Patient reports sleep is overall okay.  Patient denies any significant side effects to medications.  Reports she is compliant.  Patient denies any suicidality, homicidality or perceptual disturbances.  Patient continues to have chronic pain, currently under the care of pain management, Dr.Lateef.  Patient agreeable to dosage reduction of Abilify due to concerns of  long-term side effects, long-term plan to taper it off completely.  Patient denies any other concerns today.  Visit Diagnosis:    ICD-10-CM   1. Primary insomnia  F51.01     2. MDD (major depressive disorder), recurrent, in full remission (HCC)  F33.42 ARIPiprazole (ABILIFY) 2 MG tablet    3. GAD (generalized anxiety disorder)  F41.1     4. Alcohol use disorder, moderate, in sustained remission (HCC)  F10.21       Past Psychiatric History: I have reviewed by psychiatric history from progress note on 12/26/2021.  Past trials of medications like sertraline, multiple other medications-does not remember all the names  Past Medical History:  Past Medical History:  Diagnosis Date   Alcohol abuse    Allergy 1983   Anemia    Anxiety    Arthritis    Cataract    Cervicalgia    Cirrhosis (HCC) 1994   COPD (chronic obstructive pulmonary disease) (HCC)    Depression    Difficult intubation    has plates and screws in neck   Dyspnea    GERD (gastroesophageal reflux disease)    Headache    Heart murmur    on heard when pt is lying   Hypertension    Neuromuscular disorder (HCC)    Neuropathy   Other and unspecified hyperlipidemia    Oxygen deficiency    Status post insertion of spinal cord stimulator    Tachycardia    d/t questionable anxiety happens every 5- 10 years, sees [facility]    Past Surgical History:  Procedure Laterality Date   ANTERIOR CERVICAL DECOMP/DISCECTOMY FUSION  2012, 2015, 2018  x3   AUGMENTATION MAMMAPLASTY Bilateral 1982   BREAST ENHANCEMENT SURGERY  1981   CARPAL TUNNEL RELEASE  11/11/2011   Procedure: CARPAL TUNNEL RELEASE;  Surgeon: [name], MD;  Location: [facility];  Service: Neurosurgery;  Laterality: Right;  Right Median Nerve Decompression   CARPAL TUNNEL RELEASE  2013   COLONOSCOPY WITH PROPOFOL N/A 11/01/2017   Procedure: COLONOSCOPY WITH PROPOFOL;  Surgeon: [name], MD;  Location: [facility] ENDOSCOPY;  Service:  Endoscopy;  Laterality: N/A;   ESOPHAGOGASTRODUODENOSCOPY     JOINT REPLACEMENT  2022   LUMBAR LAMINECTOMY/DECOMPRESSION MICRODISCECTOMY N/A 08/15/2019   Procedure: Lumbar three to Sacral one Decompressive lumbar laminectomy;  Surgeon: [name], MD;  Location: [facility] OR;  Service: Neurosurgery;  Laterality: N/A;   neck disc surgery     plates and screws in neck x 2   ROTATOR CUFF REPAIR  06/16/2016   right shoulder    ROTATOR CUFF REPAIR     SPINAL CORD STIMULATOR INSERTION N/A 03/17/2021   Procedure: THORACIC SPINAL CORD STIMULATOR (PERCUTANEOUS) & PULSE GENERATOR PLACEMENT;  Surgeon: [name], MD;  Location: [facility] ORS;  Service: Neurosurgery;  Laterality: N/A;   SPINE SURGERY  2020   TONSILLECTOMY AND ADENOIDECTOMY  1973   TOTAL HIP ARTHROPLASTY Right 04/15/2021   Procedure: TOTAL HIP ARTHROPLASTY ANTERIOR APPROACH;  Surgeon: [name], MD;  Location: [facility] ORS;  Service: Orthopedics;  Laterality: Right;    Family Psychiatric History: Reviewed family psychiatric history from progress note on 12/26/2021.  Family History:  Family History  Problem Relation Age of Onset   Alcohol abuse Mother    Bipolar disorder Mother    Suicidality Mother    Arthritis Mother    Early death Mother    Pancreatic cancer Father    Alcohol abuse Father    Cancer Father    Drug abuse Sister    Alcohol abuse Sister    Depression Sister    Alcohol abuse Sister    Anxiety disorder Sister    Anesthesia problems Neg Hx    Breast cancer Neg Hx     Social History: Reviewed social history from progress note on 12/26/2021. Social History   Socioeconomic History   Marital status: Married    Spouse name: jerry   Number of children: 0   Years of education: Not on file   Highest education level: Bachelor's degree (e.g., BA, AB, BS)  Occupational History   Not on file  Tobacco Use   Smoking status: Former    Packs/day: 2.00    Years: 45.00    Additional pack years: 0.00    Total pack years:  90.00    Types: Cigarettes    Quit date: 10/28/2020    Years since quitting: 2.1   Smokeless tobacco: Never   Tobacco comments:    [name] been quit for about a year now.  Vaping Use   Vaping Use: Former   Devices: vape occasionally  Substance and Sexual Activity   Alcohol use: No    Comment: recovering alcoholic Since 1994    Drug use: Never   Sexual activity: Not Currently    Birth control/protection: Abstinence  Other Topics Concern   Not on file  Social History Narrative   Married; full time; does not get regular exercise.    Social Determinants of Health   Financial Resource Strain: Low Risk  (11/09/2022)   Overall Financial Resource Strain (CARDIA)    Difficulty of Paying Living Expenses: Not hard at all  Food Insecurity: No Food Insecurity (11/09/2022)   Hunger Vital Sign    Worried About Running Out of Food in the Last Year: Never true    Ran Out of Food in the Last Year: Never true  Transportation Needs: No Transportation Needs (11/09/2022)   PRAPARE - Administrator, Civil Service (Medical): No    Lack of Transportation (Non-Medical): No  Physical Activity: Insufficiently Active (11/09/2022)   Exercise Vital Sign    Days of Exercise per Week: 3 days    Minutes of Exercise per Session: 30 min  Stress: No Stress Concern Present (11/09/2022)   [employer] of Occupational Health - Occupational Stress Questionnaire    Feeling of Stress : Not at all  Social Connections: Moderately Isolated (11/09/2022)   Social Connection and Isolation Panel [NHANES]    Frequency of Communication with Friends and Family: More than three times a week    Frequency of Social Gatherings with Friends and Family: More than three times a week    Attends Religious Services: Never    Database administrator or Organizations: No    Attends Banker Meetings: Never    Marital Status: Married    Allergies:  Allergies  Allergen Reactions   Sulfasalazine Other (See  Comments)   Plaquenil [Hydroxychloroquine] Rash    Metabolic Disorder Labs: Lab Results  Component Value Date   HGBA1C 5.3 11/23/2022   No results found for: "PROLACTIN" Lab Results  Component Value Date   CHOL 170 11/23/2022   TRIG 196 (H) 11/23/2022   HDL 59 11/23/2022   CHOLHDL 2.9 11/23/2022   LDLCALC 78 11/23/2022   LDLCALC 79 11/17/2021   Lab Results  Component Value Date   TSH 3.673 03/10/2022   TSH 2.170 11/17/2021    Therapeutic Level Labs: No results found for: "LITHIUM" No results found for: "VALPROATE" No results found for: "CBMZ"  Current Medications: Current Outpatient Medications  Medication Sig Dispense Refill   acyclovir ointment (ZOVIRAX) 5 % :Topical Every 3 Hours as needed for lesion 30 g 1   albuterol (PROVENTIL) (2.5 MG/3ML) 0.083% nebulizer solution Take 3 mLs (2.5 mg total) by nebulization every 4 (four) hours as needed for wheezing or shortness of breath. 225 mL 2   albuterol (VENTOLIN HFA) 108 (90 Base) MCG/ACT inhaler INHALE TWO PUFFS BY MOUTH INTO LUNGS every SIX hours AS NEEDED FOR WHEEZING AND/OR SHORTNESS OF BREATH 8.5 g 2   ARIPiprazole (ABILIFY) 2 MG tablet Take 1 tablet (2 mg total) by mouth daily. Stop Abilify 5 mg - dose change 30 tablet 1   atorvastatin (LIPITOR) 40 MG tablet TAKE ONE TABLET BY MOUTH EVERY MORNING 90 tablet 0   Budeson-Glycopyrrol-Formoterol (BREZTRI AEROSPHERE) 160-9-4.8 MCG/ACT AERO Inhale 2 puffs into the lungs in the morning and at bedtime. 10.7 g 11   carvedilol (COREG) 12.5 MG tablet TAKE ONE TABLET BY MOUTH EVERY MORNING and TAKE ONE TABLET BY MOUTH EVERYDAY AT BEDTIME 180 tablet 0   ezetimibe (ZETIA) 10 MG tablet TAKE ONE TABLET BY MOUTH EVERY MORNING 90 tablet 0   fexofenadine (ALLEGRA) 180 MG tablet Take 180 mg by mouth daily.     furosemide (LASIX) 20 MG tablet Take 1 tablet (20 mg total) by mouth daily. 90 tablet 0   gabapentin (NEURONTIN) 400 MG capsule Take 1 capsule (400 mg total) by mouth 2 (two) times  daily. 60 capsule 5   ipratropium (ATROVENT) 0.03 % nasal spray Place 2 sprays into both nostrils every 12 (  twelve) hours. 30 mL 12   leflunomide (ARAVA) 20 MG tablet Take 20 mg by mouth at bedtime.     LINZESS 145 MCG CAPS capsule Take 145 mcg by mouth daily before breakfast.     losartan (COZAAR) 25 MG tablet Take 1 tablet (25 mg total) by mouth daily. 90 tablet 1   meloxicam (MOBIC) 15 MG tablet Take 15 mg by mouth at bedtime.     methylPREDNISolone (MEDROL DOSEPAK) 4 MG TBPK tablet Take as directed in the package. 21 tablet 0   mirtazapine (REMERON) 15 MG tablet TAKE ONE TABLET BY MOUTH EVERYDAY AT BEDTIME 90 tablet 0   montelukast (SINGULAIR) 10 MG tablet TAKE ONE TABLET BY MOUTH EVERYDAY AT BEDTIME 30 tablet 11   Multiple Vitamin (MULTIVITAMIN WITH MINERALS) TABS tablet Take 1 tablet by mouth daily.     Nebulizer MISC Compressor nebulizer to use with nebulized medications as instructed 1 each 0   omeprazole (PRILOSEC) 40 MG capsule TAKE ONE CAPSULE BY MOUTH EVERY MORNING and TAKE ONE CAPSULE BY MOUTH EVERYDAY AT BEDTIME 180 capsule 1   oxyCODONE-acetaminophen (PERCOCET) 5-325 MG tablet Take 1 tablet by mouth every 8 (eight) hours as needed for severe pain. Must last 30 days. 90 tablet 0   [START ON 12/29/2022] oxyCODONE-acetaminophen (PERCOCET) 5-325 MG tablet Take 1 tablet by mouth every 8 (eight) hours as needed for severe pain. Must last 30 days. 90 tablet 0   Oxymetazoline HCl (MUCINEX NASAL SPRAY MOISTURE NA) Place into the nose.     potassium chloride SA (KLOR-CON M) 20 MEQ tablet TAKE ONE TABLET BY MOUTH EVERY MORNING 90 tablet 1   topiramate (TOPAMAX) 25 MG tablet Take 1 tablet (25 mg total) by mouth 2 (two) times daily. 60 tablet 11   valACYclovir (VALTREX) 1000 MG tablet TAKE TWO TABLETS BY MOUTH TWICE DAILY AS NEEDED FOR 5 DAYS FOR FEVER blisters 30 tablet 1   venlafaxine XR (EFFEXOR-XR) 75 MG 24 hr capsule TAKE THREE CAPSULES BY MOUTH EVERY MORNING 270 capsule 0   XIIDRA 5 % SOLN  Place 1 drop into both eyes daily.     SPIKEVAX syringe  (Patient not taking: Reported on 12/01/2022)     No current facility-administered medications for this visit.     Musculoskeletal: Strength & Muscle Tone:  UTA Gait & Station:  Seated Patient leans: N/A  Psychiatric Specialty Exam: Review of Systems  Psychiatric/Behavioral: Negative.    All other systems reviewed and are negative.   There were no vitals taken for this visit.There is no height or weight on file to calculate BMI.  General Appearance: Casual  Eye Contact:  Fair  Speech:  Clear and Coherent  Volume:  Normal  Mood:  Euthymic  Affect:  Congruent  Thought Process:  Goal Directed and Descriptions of Associations: Intact  Orientation:  Full (Time, Place, and Person)  Thought Content: Logical   Suicidal Thoughts:  No  Homicidal Thoughts:  No  Memory:  Immediate;   Fair Recent;   Fair Remote;   Fair  Judgement:  Fair  Insight:  Good  Psychomotor Activity:  Normal  Concentration:  Concentration: Fair and Attention Span: Fair  Recall:  [employer] of Knowledge: Fair  Language: Fair  Akathisia:  No  Handed:  Right  AIMS (if indicated): not done  Assets:  Communication Skills Desire for Improvement Housing Social Support  ADL's:  Intact  Cognition: WNL  Sleep:  Fair   Screenings: AIMS    Haematologist  Visit from 08/13/2022 in [facility] Office Visit from 05/13/2022 in [facility] Office Visit from 03/31/2022 in [facility] Office Visit from 01/22/2022 in [facility] Office Visit from 12/26/2021 in [facility]  AIMS Total Score 0 0 0 0 0      GAD-7    Flowsheet Row Video Visit from 12/10/2022 in [facility] Office Visit from 05/13/2022 in [facility] Office Visit from 03/31/2022 in [facility] Office Visit from 01/22/2022 in [facility] Office Visit from 12/26/2021 in [facility]  Total GAD-7 Score 0 0 2 0 0      PHQ2-9    Flowsheet Row Pulmonary Rehab from 12/10/2022 in [facility] Cardiac and Pulmonary Rehab Most recent reading at 12/10/2022  2:13 PM Video Visit from 12/10/2022 in [facility] Most recent reading at 12/10/2022  1:10 PM Office Visit from 11/23/2022 in [facility] Most recent reading at 11/23/2022 10:02 AM Clinical Support from 11/09/2022 in [facility] Most recent reading at 11/09/2022  2:54 PM Video Visit from 08/13/2022 in [facility] Most recent reading at 08/13/2022  9:40 AM  PHQ-2 Total Score 0 0 0 0 0  PHQ-9 Total Score 0 0 0 -- --      Flowsheet Row Video Visit from 12/10/2022 in [facility] Video Visit from 08/13/2022 in [facility] Office Visit from 05/13/2022 in [facility]  C-SSRS RISK CATEGORY Low Risk No Risk Low Risk        Assessment and Plan: Tashi Andujo Lheureux is a 67 year old Caucasian female, married, on disability, lives in [place], has a history of GAD, MDD, sleep problems, COPD, gastroesophageal reflux disease, hypertension, multiple other medical problems was evaluated by telemedicine today.  Patient is currently stable, agreeable to dosage reduction of Abilify with plan to taper it off.  Plan as noted below.  Plan MDD in full remission Venlafaxine 225 mg p.o. daily with breakfast Reduce Abilify to 2 mg p.o. daily in the morning.  Patient advised to monitor her symptoms closely and let writer know if she has any  worsening symptoms since being on a lower dosage.  GAD-stable Venlafaxine 225 mg p.o. daily  Primary insomnia-stable Mirtazapine 15 mg p.o. nightly Continue sleep hygiene techniques  Alcohol use disorder in remission Sober since 1994.  Follow-up in clinic in 2 months or sooner if needed.     Consent: Patient/Guardian gives verbal consent for treatment and assignment of benefits for services provided during this visit. Patient/Guardian expressed understanding and agreed to proceed.   This note was generated in part or whole with voice recognition software. Voice recognition is usually quite accurate but there are transcription errors that can and very often do occur. I apologize for any typographical errors that were not detected and corrected.    Jomarie Longs, MD 12/11/2022, 10:57 AM

## 2022-12-10 NOTE — Progress Notes (Signed)
Daily Session Note  Patient Details  Name: Gabrielle Pineda MRN: [id number] Date of Birth: 1956/05/03 Referring Provider:   Doristine Devoid Pulmonary Rehab from 07/27/2022 in [facility] Cardiac and Pulmonary Re[facility]  Referring Provider Sarina Ser MD       Encounter Date: 12/10/2022  Check In:  Session Check In - 12/10/22 0930       Check-In   Supervising physician immediately available to respond to emergencies See telemetry face sheet for immediately available ER MD    Location [facility]-Cardiac & Pulmonary Rehab    Staff Present Lanny Hurst, RN, ADN;Jessica Juanetta Gosling, MA, RCEP, CCRP, CCET;Betsy Coder, PhD, RN, CNS, Josephine Cables, BS, Exercise Physiologist    Virtual Visit No    Medication changes reported     No    Fall or balance concerns reported    No    Warm-up and Cool-down Performed on first and last piece of equipment    Resistance Training Performed Yes    VAD Patient? No    PAD/SET Patient? No      Pain Assessment   Currently in Pain? No/denies                Social History   Tobacco Use  Smoking Status Former   Packs/day: 2.00   Years: 45.00   Additional pack years: 0.00   Total pack years: 90.00   Types: Cigarettes   Quit date: 10/28/2020   Years since quitting: 2.1  Smokeless Tobacco Never  Tobacco Comments   Ive been quit for about a year now.    Goals Met:  Independence with exercise equipment Exercise tolerated well No report of concerns or symptoms today Strength training completed today  Goals Unmet:  Not Applicable  Comments: Pt able to follow exercise prescription today without complaint.  Will continue to monitor for progression.    Dr. Bethann Punches is Medical Director for [facility] Cardiac Rehabilitation.  Dr. Vida Rigger is Medical Director for [facility] Pulmonary Rehabilitation.

## 2022-12-11 DIAGNOSIS — M79671 Pain in right foot: Secondary | ICD-10-CM | POA: Diagnosis not present

## 2022-12-14 ENCOUNTER — Telehealth: Payer: Self-pay

## 2022-12-14 ENCOUNTER — Ambulatory Visit
Admission: RE | Admit: 2022-12-14 | Discharge: 2022-12-14 | Disposition: A | Discharge status: Home / Self Care | Payer: Medicare Other | Source: Ambulatory Visit | Attending: Student in an Organized Health Care Education/Training Program | Admitting: Student in an Organized Health Care Education/Training Program

## 2022-12-14 ENCOUNTER — Ambulatory Visit
Payer: Medicare Other | Attending: Student in an Organized Health Care Education/Training Program | Admitting: Student in an Organized Health Care Education/Training Program

## 2022-12-14 DIAGNOSIS — G8929 Other chronic pain: Secondary | ICD-10-CM | POA: Diagnosis not present

## 2022-12-14 DIAGNOSIS — M5416 Radiculopathy, lumbar region: Secondary | ICD-10-CM | POA: Diagnosis not present

## 2022-12-14 DIAGNOSIS — G894 Chronic pain syndrome: Secondary | ICD-10-CM | POA: Diagnosis not present

## 2022-12-14 DIAGNOSIS — M48061 Spinal stenosis, lumbar region without neurogenic claudication: Secondary | ICD-10-CM | POA: Diagnosis not present

## 2022-12-14 MED ORDER — DIAZEPAM 5 MG PO TABS
5.0000 mg | ORAL_TABLET | ORAL | Status: AC
  Administered 2022-12-14: 5 mg via ORAL

## 2022-12-14 MED ORDER — DIAZEPAM 5 MG PO TABS
ORAL_TABLET | ORAL | Status: AC
  Filled 2022-12-14: qty 1

## 2022-12-14 MED ORDER — DEXAMETHASONE SODIUM PHOSPHATE 10 MG/ML IJ SOLN
INTRAMUSCULAR | Status: AC
  Filled 2022-12-14: qty 1

## 2022-12-14 MED ORDER — LIDOCAINE HCL 2 % IJ SOLN
20.0000 mL | Freq: Once | INTRAMUSCULAR | Status: AC
  Administered 2022-12-14: 200 mg
  Filled 2022-12-14: qty 20

## 2022-12-14 MED ORDER — ROPIVACAINE HCL 2 MG/ML IJ SOLN
2.0000 mL | Freq: Once | INTRAMUSCULAR | Status: AC
  Administered 2022-12-14: 2 mL via EPIDURAL
  Filled 2022-12-14: qty 20

## 2022-12-14 MED ORDER — SODIUM CHLORIDE 0.9% FLUSH
2.0000 mL | Freq: Once | INTRAVENOUS | Status: AC
  Administered 2022-12-14: 2 mL

## 2022-12-14 MED ORDER — DEXAMETHASONE SODIUM PHOSPHATE 10 MG/ML IJ SOLN
20.0000 mg | Freq: Once | INTRAMUSCULAR | Status: AC
  Administered 2022-12-14: 20 mg
  Filled 2022-12-14: qty 2

## 2022-12-14 MED ORDER — SODIUM CHLORIDE (PF) 0.9 % IJ SOLN
INTRAMUSCULAR | Status: AC
  Filled 2022-12-14: qty 10

## 2022-12-14 MED ORDER — IOHEXOL 180 MG/ML  SOLN
10.0000 mL | Freq: Once | INTRAMUSCULAR | Status: AC
  Administered 2022-12-14: 10 mL via EPIDURAL
  Filled 2022-12-14: qty 20

## 2022-12-14 NOTE — Progress Notes (Signed)
Safety precautions to be maintained throughout the outpatient stay will include: orient to surroundings, keep bed in low position, maintain call bell within reach at all times, provide assistance with transfer out of bed and ambulation.   1116 received patient into Recovery room due to fall in  the hallway.   Sitting up in bed with ice to forehead.  Half dollar size knot on right side of forehead with small 1/2 inch gash.  Alert and oriented x 3.Extremity strength equal.  States she is not sure what happened to make her fall.  Denies pain, SOB,or dizziness.  VS [phone].  Husband at bedside.   1124 Ice pack remains to forehead.  Knot remains about the same size.  Talking, denies pain, SOB or dizziness.  VS 97.2-72-100-171/86.  Tolerating po fluids.  1137  VSS. Alert and oriented x 3.  Bilateral hand grip and leg strength equal.  Denies pain, SOB or dizziness.  Ice pack remains to forehead.  Knot is still about a half dollar size.Dr Cherylann Ratel noitified. 1142 Dr Cherylann Ratel in to assess.   Ice pack remains to forehead.  1146 Patient states her head is hurting a 9/10.  Dr Cherylann Ratel notified.  1152 Dermabond applied by Dr Cherylann Ratel.  Husband informed to go to ED for any changes in mental status or any other complaints.  Discharge order per Dr Cherylann Ratel.  Will call patient at 3pm,Unit supervisor encouraged patient to go to the ED but patient declined. Husband present. Discharged to home via wheelchair to private auto.

## 2022-12-14 NOTE — Progress Notes (Signed)
PROVIDER NOTE: Interpretation of information contained herein should be left to medically-trained personnel. Specific patient instructions are provided elsewhere under "Patient Instructions" section of medical record. This document was created in part using STT-dictation technology, any transcriptional errors that may result from this process are unintentional.  Patient: Gabrielle Pineda Type: Established DOB: 18-Feb-1956 MRN: [id number] PCP: [name], PA-C  Service: Procedure DOS: 12/14/2022 Setting: Ambulatory Location: Ambulatory outpatient facility Delivery: Face-to-face Provider: Edward Jolly, MD Specialty: Interventional Pain Management Specialty designation: 09 Location: Outpatient facility Ref. Prov.: Edward Jolly, MD       Interventional Therapy   Procedure: Lumbar trans-foraminal epidural steroid injection (L-TFESI) #1  Laterality: Bilateral (-50)  Level: L4 nerve root(s) Imaging: Fluoroscopy-guided         Anesthesia: Local anesthesia (1-2% Lidocaine) Anxiolysis:Valium 5 mg PO DOS: 12/14/2022  Performed by: Edward Jolly, MD  Purpose: Diagnostic/Therapeutic Indications: Lumbar radicular pain severe enough to impact quality of life or function. 1. Lumbar radiculopathy (R>L)   2. Chronic radicular lumbar pain   3. Chronic pain syndrome   4. Lumbar foraminal stenosis    NAS-11 Pain score:   Pre-procedure: 8 /10   Post-procedure: 0-No pain/10     Position / Prep / Materials:  Position: Prone  Prep solution: DuraPrep (Iodine Povacrylex [0.7% available iodine] and Isopropyl Alcohol, 74% w/w) Prep Area: Entire Posterior Lumbosacral Area.  From the lower tip of the scapula down to the tailbone and from flank to flank. Materials:  Tray: Block Needle(s):  Type: Spinal  Gauge (G): 22  Length: 3.5-in  Qty: 2      Pre-op H&P Assessment:  Gabrielle Pineda is a 67 y.o. (year old), female patient, seen today for interventional treatment. She  has a past surgical  history that includes neck disc surgery; Breast enhancement surgery (1981); Esophagogastroduodenoscopy; Tonsillectomy and adenoidectomy (1973); Carpal tunnel release (11/11/2011); Carpal tunnel release (2013); Rotator cuff repair (06/16/2016); Rotator cuff repair; Colonoscopy with propofol (N/A, 11/01/2017); Augmentation mammaplasty (Bilateral, 1982); Anterior cervical decomp/discectomy fusion (2012, 2015, 2018); Lumbar laminectomy/decompression microdiscectomy (N/A, 08/15/2019); Spinal cord stimulator insertion (N/A, 03/17/2021); Total hip arthroplasty (Right, 04/15/2021); Joint replacement (2022); and Spine surgery (2020). Gabrielle Pineda has a current medication list which includes the following prescription(s): acyclovir ointment, albuterol, albuterol, aripiprazole, atorvastatin, breztri aerosphere, carvedilol, ezetimibe, fexofenadine, furosemide, gabapentin, ipratropium, leflunomide, linzess, losartan, meloxicam, methylprednisolone, mirtazapine, montelukast, multivitamin with minerals, nebulizer, omeprazole, oxycodone-acetaminophen, [START ON 12/29/2022] oxycodone-acetaminophen, oxymetazoline hcl, potassium chloride sa, spikevax, topiramate, valacyclovir, venlafaxine xr, xiidra, and prednisone. Her primarily concern today is the Back Pain  Initial Vital Signs:  Pulse/HCG Rate: 69ECG Heart Rate: 66 Temp: 98.1 F (36.7 C) Resp: 16 BP: (!) 172/91 SpO2: 99 %  BMI: Estimated body mass index is 25.1 kg/m as calculated from the following:   Height as of this encounter: 5\' 9"  (1.753 m).   Weight as of this encounter: 170 lb (77.1 kg).  Risk Assessment: Allergies: Reviewed. She is allergic to sulfasalazine and plaquenil [hydroxychloroquine].  Allergy Precautions: None required Coagulopathies: Reviewed. None identified.  Blood-thinner therapy: None at this time Active Infection(s): Reviewed. None identified. Gabrielle Pineda is afebrile  Site Confirmation: Gabrielle Pineda was asked to confirm the procedure  and laterality before marking the site Procedure checklist: Completed Consent: Before the procedure and under the influence of no sedative(s), amnesic(s), or anxiolytics, the patient was informed of the treatment options, risks and possible complications. To fulfill our ethical and legal obligations, as recommended by the American Medical Association's Code of Ethics, I have informed the patient  of my clinical impression; the nature and purpose of the treatment or procedure; the risks, benefits, and possible complications of the intervention; the alternatives, including doing nothing; the risk(s) and benefit(s) of the alternative treatment(s) or procedure(s); and the risk(s) and benefit(s) of doing nothing. The patient was provided information about the general risks and possible complications associated with the procedure. These may include, but are not limited to: failure to achieve desired goals, infection, bleeding, organ or nerve damage, allergic reactions, paralysis, and death. In addition, the patient was informed of those risks and complications associated to Spine-related procedures, such as failure to decrease pain; infection (i.e.: Meningitis, epidural or intraspinal abscess); bleeding (i.e.: epidural hematoma, subarachnoid hemorrhage, or any other type of intraspinal or peri-dural bleeding); organ or nerve damage (i.e.: Any type of peripheral nerve, nerve root, or spinal cord injury) with subsequent damage to sensory, motor, and/or autonomic systems, resulting in permanent pain, numbness, and/or weakness of one or several areas of the body; allergic reactions; (i.e.: anaphylactic reaction); and/or death. Furthermore, the patient was informed of those risks and complications associated with the medications. These include, but are not limited to: allergic reactions (i.e.: anaphylactic or anaphylactoid reaction(s)); adrenal axis suppression; blood sugar elevation that in diabetics may result in  ketoacidosis or comma; water retention that in patients with history of congestive heart failure may result in shortness of breath, pulmonary edema, and decompensation with resultant heart failure; weight gain; swelling or edema; medication-induced neural toxicity; particulate matter embolism and blood vessel occlusion with resultant organ, and/or nervous system infarction; and/or aseptic necrosis of one or more joints. Finally, the patient was informed that Medicine is not an exact science; therefore, there is also the possibility of unforeseen or unpredictable risks and/or possible complications that may result in a catastrophic outcome. The patient indicated having understood very clearly. We have given the patient no guarantees and we have made no promises. Enough time was given to the patient to ask questions, all of which were answered to the patient's satisfaction. Gabrielle Pineda has indicated that she wanted to continue with the procedure. Attestation: I, the ordering provider, attest that I have discussed with the patient the benefits, risks, side-effects, alternatives, likelihood of achieving goals, and potential problems during recovery for the procedure that I have provided informed consent. Date  Time: 12/14/2022 10:27 AM   Pre-Procedure Preparation:  Monitoring: As per clinic protocol. Respiration, ETCO2, SpO2, BP, heart rate and rhythm monitor placed and checked for adequate function Safety Precautions: Patient was assessed for positional comfort and pressure points before starting the procedure. Time-out: I initiated and conducted the "Time-out" before starting the procedure, as per protocol. The patient was asked to participate by confirming the accuracy of the "Time Out" information. Verification of the correct person, site, and procedure were performed and confirmed by me, the nursing staff, and the patient. "Time-out" conducted as per Joint Commission's Universal Protocol  (UP.01.01.01). Time: 1054 Start Time: 1054 hrs.  Description/Narrative of Procedure:          Target: The 6 o'clock position under the pedicle, on the affected side. Region: Posterolateral Lumbosacral Approach: Posterior Percutaneous Paravertebral approach.  Rationale (medical necessity): procedure needed and proper for the diagnosis and/or treatment of the patient's medical symptoms and needs. Procedural Technique Safety Precautions: Aspiration looking for blood return was conducted prior to all injections. At no point did we inject any substances, as a needle was being advanced. No attempts were made at seeking any paresthesias. Safe injection practices and needle  disposal techniques used. Medications properly checked for expiration dates. SDV (single dose vial) medications used. Description of the Procedure: Protocol guidelines were followed. The patient was placed in position over the procedure table. The target area was identified and the area prepped in the usual manner. Skin & deeper tissues infiltrated with local anesthetic. Appropriate amount of time allowed to pass for local anesthetics to take effect. The procedure needles were then advanced to the target area. Proper needle placement secured. Negative aspiration confirmed. Solution injected in intermittent fashion, asking for systemic symptoms every 0.5cc of injectate. The needles were then removed and the area cleansed, making sure to leave some of the prepping solution back to take advantage of its long term bactericidal properties.  5 cc solution made of 1 cc of preservative-free saline, 2 cc of 0.2% ropivacaine, 2 cc of Decadron 10 mg/cc. 2.5 cc injected for left L4 nerve 2.5 cc injected for right L4 nerve   Vitals:   12/14/22 1116 12/14/22 1123 12/14/22 1130 12/14/22 1136  BP: (!) 175/96 (!) 171/86 (!) 167/89 (!) 167/86  Pulse:      Resp: 18 16 16 16   Temp:      SpO2: 99% 100% 100% 100%  Weight:      Height:        Start  Time: 1054 hrs. End Time: 1103 hrs.  Imaging Guidance (Spinal):          Type of Imaging Technique: Fluoroscopy Guidance (Spinal) Indication(s): Assistance in needle guidance and placement for procedures requiring needle placement in or near specific anatomical locations not easily accessible without such assistance. Exposure Time: Please see nurses notes. Contrast: Before injecting any contrast, we confirmed that the patient did not have an allergy to iodine, shellfish, or radiological contrast. Once satisfactory needle placement was completed at the desired level, radiological contrast was injected. Contrast injected under live fluoroscopy. No contrast complications. See chart for type and volume of contrast used. Fluoroscopic Guidance: I was personally present during the use of fluoroscopy. "Tunnel Vision Technique" used to obtain the best possible view of the target area. Parallax error corrected before commencing the procedure. "Direction-depth-direction" technique used to introduce the needle under continuous pulsed fluoroscopy. Once target was reached, antero-posterior, oblique, and lateral fluoroscopic projection used confirm needle placement in all planes. Images permanently stored in EMR. Interpretation: I personally interpreted the imaging intraoperatively. Adequate needle placement confirmed in multiple planes. Appropriate spread of contrast into desired area was observed. No evidence of afferent or efferent intravascular uptake. No intrathecal or subarachnoid spread observed. Permanent images saved into the patient's record.  Post-operative Assessment:  Post-procedure Vital Signs:  Pulse/HCG Rate: 6970 Temp: 98.1 F (36.7 C) Resp: 16 BP: (!) 167/86 SpO2: 100 %  EBL: None  Complications: No immediate post-treatment complications observed by team, or reported by patient.  Note:  As the patient was checking out, she had a fall and hit her head resulting in a small hematoma and  abrasion.  She was brought to the recovery room and monitored.  She was given an ice pack to place over her head which helped reduce swelling.  I also applied a small strip of Dermabond to the abrasion.  She was given instructions to monitor her pain.  Of note her blood pressure was elevated during her visit and we instructed her to follow-up with her primary care provider to discuss.  A repeat set of vitals were taken after the procedure and the patient was kept under observation following institutional policy,  for this type of procedure. Post-procedural neurological assessment was performed, showing return to baseline, prior to discharge. The patient was provided with post-procedure discharge instructions, including a section on how to identify potential problems. Should any problems arise concerning this procedure, the patient was given instructions to immediately contact us, at any time, without hesitation. In any case, we plan to contact the patient by telephone for a follow-up status report regarding this interventional procedure.  Comments:  No additional relevant information.  Plan of Care (POC)  Orders:  Orders Placed This Encounter  Procedures   DG PAIN CLINIC C-ARM 1-60 MIN NO REPORT    Intraoperative interpretation by procedural physician at [facility].    Standing Status:   Standing    Number of Occurrences:   1    Order Specific Question:   Reason for exam:    Answer:   Assistance in needle guidance and placement for procedures requiring needle placement in or near specific anatomical locations not easily accessible without such assistance.   Chronic Opioid Analgesic: Percocet 5 mg TID PRN  Medications ordered for procedure: Meds ordered this encounter  Medications   iohexol (OMNIPAQUE) 180 MG/ML injection 10 mL    Must be Myelogram-compatible. If not available, you may substitute with a water-soluble, non-ionic, hypoallergenic, myelogram-compatible radiological  contrast medium.   lidocaine (XYLOCAINE) 2 % (with pres) injection 400 mg   diazepam (VALIUM) tablet 5 mg    Make sure Flumazenil is available in the pyxis when using this medication. If oversedation occurs, administer 0.2 mg IV over 15 sec. If after 45 sec no response, administer 0.2 mg again over 1 min; may repeat at 1 min intervals; not to exceed 4 doses (1 mg)   sodium chloride flush (NS) 0.9 % injection 2 mL    This is for a two (2) level block. Use two (2) syringes and divide content in half.   ropivacaine (PF) 2 mg/mL (0.2%) (NAROPIN) injection 2 mL    This is for a two (2) level block. Use two (2) syringes and divide content in half.   dexamethasone (DECADRON) injection 20 mg    This is for a two (2) level block. Use two (2) syringes and divide content in half.   Medications administered: We administered iohexol, lidocaine, diazepam, sodium chloride flush, ropivacaine (PF) 2 mg/mL (0.2%), and dexamethasone.  See the medical record for exact dosing, route, and time of administration.  Follow-up plan:   Return keep scheduled appt.       Recent Visits Date Type Provider Dept  11/10/22 Office Visit Edward Jolly, MD [facility]  10/06/22 Office Visit Edward Jolly, MD [facility]  Showing recent visits within past 90 days and meeting all other requirements Today's Visits Date Type Provider Dept  12/14/22 Procedure visit Edward Jolly, MD [facility]  Showing today's visits and meeting all other requirements Future Appointments Date Type Provider Dept  01/19/23 Appointment Edward Jolly, MD [facility]  Showing future appointments within next 90 days and meeting all other requirements  Disposition: Discharge home  Discharge (Date  Time): 12/14/2022; 1154 hrs.   Primary Care Physician: [name], PA-C Location: [facility] Note by: Edward Jolly, MD (TTS technology used. I apologize for any  typographical errors that were not detected and corrected.) Date: 12/14/2022; Time: 12:20 PM  Disclaimer:  Medicine is not an Visual merchandiser. The only guarantee in medicine is that nothing is guaranteed. It is important to note  that the decision to proceed with this intervention was based on the information collected from the patient. The Data and conclusions were drawn from the patient's questionnaire, the interview, and the physical examination. Because the information was provided in large part by the patient, it cannot be guaranteed that it has not been purposely or unconsciously manipulated. Every effort has been made to obtain as much relevant data as possible for this evaluation. It is important to note that the conclusions that lead to this procedure are derived in large part from the available data. Always take into account that the treatment will also be dependent on availability of resources and existing treatment guidelines, considered by other Pain Management Practitioners as being common knowledge and practice, at the time of the intervention. For Medico-Legal purposes, it is also important to point out that variation in procedural techniques and pharmacological choices are the acceptable norm. The indications, contraindications, technique, and results of the above procedure should only be interpreted and judged by a Board-Certified Interventional Pain Specialist with extensive familiarity and expertise in the same exact procedure and technique.

## 2022-12-14 NOTE — Patient Instructions (Addendum)
Call or go to ED for any change in mental status or complaints.   ____________________________________________________________________________________________  Post-Procedure Discharge Instructions  Instructions: Apply ice:  Purpose: This will minimize any swelling and discomfort after procedure.  When: Day of procedure, as soon as you get home. How: Fill a plastic sandwich bag with crushed ice. Cover it with a small towel and apply to injection site. How long: (15 min on, 15 min off) Apply for 15 minutes then remove x 15 minutes.  Repeat sequence on day of procedure, until you go to bed. Apply heat:  Purpose: To treat any soreness and discomfort from the procedure. When: Starting the next day after the procedure. How: Apply heat to procedure site starting the day following the procedure. How long: May continue to repeat daily, until discomfort goes away. Food intake: Start with clear liquids (like water) and advance to regular food, as tolerated.  Physical activities: Keep activities to a minimum for the first 8 hours after the procedure. After that, then as tolerated. Driving: If you have received any sedation, be responsible and do not drive. You are not allowed to drive for 24 hours after having sedation. Blood thinner: (Applies only to those taking blood thinners) You may restart your blood thinner 6 hours after your procedure. Insulin: (Applies only to Diabetic patients taking insulin) As soon as you can eat, you may resume your normal dosing schedule. Infection prevention: Keep procedure site clean and dry. Shower daily and clean area with soap and water. Post-procedure Pain Diary: Extremely important that this be done correctly and accurately. Recorded information will be used to determine the next step in treatment. For the purpose of accuracy, follow these rules: Evaluate only the area treated. Do not report or include pain from an untreated area. For the purpose of this evaluation,  ignore all other areas of pain, except for the treated area. After your procedure, avoid taking a long nap and attempting to complete the pain diary after you wake up. Instead, set your alarm clock to go off every hour, on the hour, for the initial 8 hours after the procedure. Document the duration of the numbing medicine, and the relief you are getting from it. Do not go to sleep and attempt to complete it later. It will not be accurate. If you received sedation, it is likely that you were given a medication that may cause amnesia. Because of this, completing the diary at a later time may cause the information to be inaccurate. This information is needed to plan your care. Follow-up appointment: Keep your post-procedure follow-up evaluation appointment after the procedure (usually 2 weeks for most procedures, 6 weeks for radiofrequencies). DO NOT FORGET to bring you pain diary with you.   Expect: (What should I expect to see with my procedure?) From numbing medicine (AKA: Local Anesthetics): Numbness or decrease in pain. You may also experience some weakness, which if present, could last for the duration of the local anesthetic. Onset: Full effect within 15 minutes of injected. Duration: It will depend on the type of local anesthetic used. On the average, 1 to 8 hours.  From steroids (Applies only if steroids were used): Decrease in swelling or inflammation. Once inflammation is improved, relief of the pain will follow. Onset of benefits: Depends on the amount of swelling present. The more swelling, the longer it will take for the benefits to be seen. In some cases, up to 10 days. Duration: Steroids will stay in the system x 2 weeks. Duration  of benefits will depend on multiple posibilities including persistent irritating factors. Side-effects: If present, they may typically last 2 weeks (the duration of the steroids). Frequent: Cramps (if they occur, drink Gatorade and take over-the-counter Magnesium  450-500 mg once to twice a day); water retention with temporary weight gain; increases in blood sugar; decreased immune system response; increased appetite. Occasional: Facial flushing (red, warm cheeks); mood swings; menstrual changes. Uncommon: Long-term decrease or suppression of natural hormones; bone thinning. (These are more common with higher doses or more frequent use. This is why we prefer that our patients avoid having any injection therapies in other practices.)  Very Rare: Severe mood changes; psychosis; aseptic necrosis. From procedure: Some discomfort is to be expected once the numbing medicine wears off. This should be minimal if ice and heat are applied as instructed.  Call if: (When should I call?) You experience numbness and weakness that gets worse with time, as opposed to wearing off. New onset bowel or bladder incontinence. (Applies only to procedures done in the spine)  Emergency Numbers: Durning business hours (Monday - Thursday, 8:00 AM - 4:00 PM) (Friday, 9:00 AM - 12:00 Noon): [phone] After hours: [phone] NOTE: If you are having a problem and are unable connect with, or to talk to a provider, then go to your nearest urgent care or emergency department. If the problem is serious and urgent, please call 911. ____________________________________________________________________________________________

## 2022-12-14 NOTE — Telephone Encounter (Signed)
Called patient to check after her fall.  She states that she is doing much better.  States her headache is much better.  Denies any other symptoms such as confusion, pain, SOB, weakness.  States the know on her head has gotten smaller with ice application. Instructed patient to go to the ED for any of the above complaints or for any concerns.  Patient states understanding.

## 2022-12-15 ENCOUNTER — Encounter: Payer: Medicare Other | Admitting: *Deleted

## 2022-12-15 ENCOUNTER — Telehealth: Payer: Self-pay | Admitting: *Deleted

## 2022-12-15 DIAGNOSIS — J449 Chronic obstructive pulmonary disease, unspecified: Secondary | ICD-10-CM

## 2022-12-15 NOTE — Progress Notes (Signed)
Daily Session Note  Patient Details  Name: Gabrielle Pineda MRN: [id number] Date of Birth: June 24, 1956 Referring Provider:   Flowsheet Row Pulmonary Rehab from 07/27/2022 in [facility] Cardiac and Pulmonary Rehab  Referring Provider Sarina Ser MD       Encounter Date: 12/15/2022  Check In:  Session Check In - 12/15/22 1006       Check-In   Supervising physician immediately available to respond to emergencies See telemetry face sheet for immediately available ER MD    Location [facility]-Cardiac & Pulmonary Rehab    Staff Present Cora Collum, RN, BSN, CCRP;Jessica Mildred, MA, RCEP, CCRP, Zackery Barefoot, MS, ACSM CEP, Exercise Physiologist    Virtual Visit No    Medication changes reported     No    Fall or balance concerns reported    Fall reported at MD office day before   Warm-up and Cool-down Performed on first and last piece of equipment    Resistance Training Performed Yes    VAD Patient? No    PAD/SET Patient? No      Pain Assessment   Currently in Pain? No/denies                Social History   Tobacco Use  Smoking Status Former   Packs/day: 2.00   Years: 45.00   Additional pack years: 0.00   Total pack years: 90.00   Types: Cigarettes   Quit date: 10/28/2020   Years since quitting: 2.1  Smokeless Tobacco Never  Tobacco Comments   Ive been quit for about a year now.    Goals Met:  Proper associated with RPD/PD & O2 Sat Independence with exercise equipment Exercise tolerated well No report of concerns or symptoms today  Goals Unmet:  Not Applicable  Comments: Pt able to follow exercise prescription today without complaint.  Will continue to monitor for progression.    Dr. Bethann Punches is Medical Director for [facility] Cardiac Rehabilitation.  Dr. Vida Rigger is Medical Director for [facility] Pulmonary Rehabilitation.

## 2022-12-15 NOTE — Telephone Encounter (Signed)
No problems post procedure. 

## 2022-12-17 ENCOUNTER — Encounter: Payer: Medicare Other | Attending: Physician Assistant | Admitting: *Deleted

## 2022-12-17 DIAGNOSIS — S90122A Contusion of left lesser toe(s) without damage to nail, initial encounter: Secondary | ICD-10-CM | POA: Diagnosis not present

## 2022-12-17 DIAGNOSIS — M65872 Other synovitis and tenosynovitis, left ankle and foot: Secondary | ICD-10-CM | POA: Diagnosis not present

## 2022-12-17 DIAGNOSIS — J449 Chronic obstructive pulmonary disease, unspecified: Secondary | ICD-10-CM

## 2022-12-17 DIAGNOSIS — S9032XA Contusion of left foot, initial encounter: Secondary | ICD-10-CM | POA: Diagnosis not present

## 2022-12-17 NOTE — Progress Notes (Signed)
Daily Session Note  Patient Details  Name: Gabrielle Pineda MRN: [id number] Date of Birth: [birth year] Referring Provider:   Flowsheet Row Pulmonary Rehab from 07/27/2022 in [facility] Cardiac and Pulmonary Rehab  Referring Provider Sarina Ser MD       Encounter Date: 12/17/2022  Check In:  Session Check In - 12/17/22 0928       Check-In   Supervising physician immediately available to respond to emergencies See telemetry face sheet for immediately available ER MD    Location [facility]-Cardiac & Pulmonary Rehab    Staff Present Cora Collum, RN, BSN, CCRP;Myiesha Edgar Edgar, MA, RCEP, CCRP, Zackery Barefoot, MS, ACSM CEP, Exercise Physiologist;Laureen Manson Passey, BS, RRT, CPFT    Virtual Visit No    Medication changes reported     No    Fall or balance concerns reported    No    Warm-up and Cool-down Performed on first and last piece of equipment    Resistance Training Performed Yes    VAD Patient? No    PAD/SET Patient? No      Pain Assessment   Currently in Pain? No/denies                Social History   Tobacco Use  Smoking Status Former   Packs/day: 2.00   Years: 45.00   Additional pack years: 0.00   Total pack years: 90.00   Types: Cigarettes   Quit date: 10/28/2020   Years since quitting: 2.1  Smokeless Tobacco Never  Tobacco Comments   Ive been quit for about a year now.    Goals Met:  Proper associated with RPD/PD & O2 Sat Independence with exercise equipment Exercise tolerated well No report of concerns or symptoms today Strength training completed today  Goals Unmet:  Not Applicable  Comments: Pt able to follow exercise prescription today without complaint.  Will continue to monitor for progression.    Dr. Bethann Punches is Medical Director for [facility] Cardiac Rehabilitation.  Dr. Vida Rigger is Medical Director for [facility] Pulmonary Rehabilitation.

## 2022-12-22 ENCOUNTER — Encounter: Payer: Medicare Other | Admitting: *Deleted

## 2022-12-22 DIAGNOSIS — J449 Chronic obstructive pulmonary disease, unspecified: Secondary | ICD-10-CM

## 2022-12-22 NOTE — Progress Notes (Signed)
Daily Session Note  Patient Details  Name: Gabrielle Pineda MRN: [id number] Date of Birth: 06/04/1956 Referring Provider:   Flowsheet Row Pulmonary Rehab from 07/27/2022 in [facility] Cardiac and Pulmonary Rehab  Referring Provider Sarina Ser MD       Encounter Date: 12/22/2022  Check In:  Session Check In - 12/22/22 1026       Check-In   Supervising physician immediately available to respond to emergencies See telemetry face sheet for immediately available ER MD    Staff Present Cora Collum, RN, BSN, CCRP;Jessica Weir, MA, RCEP, CCRP, Zackery Barefoot, MS, ACSM CEP, Exercise Physiologist    Virtual Visit No    Medication changes reported     No    Fall or balance concerns reported    No    Warm-up and Cool-down Performed on first and last piece of equipment    Resistance Training Performed Yes    VAD Patient? No    PAD/SET Patient? No      Pain Assessment   Currently in Pain? No/denies                Social History   Tobacco Use  Smoking Status Former   Packs/day: 2.00   Years: 45.00   Additional pack years: 0.00   Total pack years: 90.00   Types: Cigarettes   Quit date: 10/28/2020   Years since quitting: 2.1  Smokeless Tobacco Never  Tobacco Comments   Ive been quit for about a year now.    Goals Met:  Proper associated with RPD/PD & O2 Sat Independence with exercise equipment Exercise tolerated well No report of concerns or symptoms today  Goals Unmet:  Not Applicable  Comments: Pt able to follow exercise prescription today without complaint.  Will continue to monitor for progression.    Dr. Bethann Punches is Medical Director for [facility] Cardiac Rehabilitation.  Dr. Vida Rigger is Medical Director for [facility] Pulmonary Rehabilitation.

## 2022-12-24 ENCOUNTER — Encounter: Payer: Medicare Other | Admitting: *Deleted

## 2022-12-24 DIAGNOSIS — J449 Chronic obstructive pulmonary disease, unspecified: Secondary | ICD-10-CM

## 2022-12-24 NOTE — Progress Notes (Signed)
Daily Session Note  Patient Details  Name: CYLIE DENYS MRN: [id number] Date of Birth: 1956/06/27 Referring Provider:   Flowsheet Row Pulmonary Rehab from 07/27/2022 in [facility] Cardiac and Pulmonary Rehab  Referring Provider Sarina Ser MD       Encounter Date: 12/24/2022  Check In:  Session Check In - 12/24/22 0943       Check-In   Supervising physician immediately available to respond to emergencies See telemetry face sheet for immediately available ER MD    Location [facility]-Cardiac & Pulmonary Rehab    Staff Present Lanny Hurst, RN, ADN;Jessica Juanetta Gosling, MA, RCEP, CCRP, Zackery Barefoot, MS, ACSM CEP, Exercise Physiologist    Virtual Visit No    Medication changes reported     No    Fall or balance concerns reported    No    Warm-up and Cool-down Performed on first and last piece of equipment    Resistance Training Performed Yes    VAD Patient? No    PAD/SET Patient? No      Pain Assessment   Currently in Pain? No/denies                Social History   Tobacco Use  Smoking Status Former   Packs/day: 2.00   Years: 45.00   Additional pack years: 0.00   Total pack years: 90.00   Types: Cigarettes   Quit date: 10/28/2020   Years since quitting: 2.1  Smokeless Tobacco Never  Tobacco Comments   Ive been quit for about a year now.    Goals Met:  Independence with exercise equipment Exercise tolerated well No report of concerns or symptoms today Strength training completed today  Goals Unmet:  Not Applicable  Comments: Pt able to follow exercise prescription today without complaint.  Will continue to monitor for progression.    Dr. Bethann Punches is Medical Director for [facility] Cardiac Rehabilitation.  Dr. Vida Rigger is Medical Director for [facility] Pulmonary Rehabilitation.

## 2022-12-29 ENCOUNTER — Encounter: Payer: Medicare Other | Admitting: *Deleted

## 2022-12-29 DIAGNOSIS — J449 Chronic obstructive pulmonary disease, unspecified: Secondary | ICD-10-CM

## 2022-12-29 NOTE — Progress Notes (Signed)
Discharge Note  Gabrielle Pineda DOB:[birth year]  [name] graduated today from  rehab with 36 sessions completed.  Details of the patient's exercise prescription and what She needs to do in order to continue the prescription and progress were discussed with patient.  Patient was given a copy of prescription and goals.  Patient verbalized understanding.  Gabrielle Pineda plans to continue to exercise by exercising at home..  6 Minute Walk     Row Name 07/27/22 1425 12/08/22 1032       6 Minute Walk   Phase Initial Discharge    Distance 1035 feet 1215 feet    Distance % Change -- 17.4 %    Distance Feet Change -- 180 ft    Walk Time 6 minutes 6 minutes    # of Rest Breaks 0 0    MPH 1.96 2.3    METS 3.24 3.54    RPE 14 13    Perceived Dyspnea  2 1    VO2 Peak 11.33 12.41    Symptoms Yes (comment) Yes (comment)    Comments Hip pain 6/10 SOB    Resting HR 70 bpm 75 bpm    Resting BP 126/70 146/70    Resting Oxygen Saturation  94 % 97 %    Exercise Oxygen Saturation  during 6 min walk 92 % 95 %    Max Ex. HR 110 bpm 110 bpm    Max Ex. BP 184/74 182/76    2 Minute Post BP 154/72 140/74      Interval HR   1 Minute HR 96 95    2 Minute HR 100 105    3 Minute HR 99 108    4 Minute HR 97 109    5 Minute HR 103 108    6 Minute HR 110 110    2 Minute Post HR 77 77    Interval Heart Rate? Yes Yes      Interval Oxygen   Interval Oxygen? Yes Yes    Baseline Oxygen Saturation % 94 % 97 %    1 Minute Oxygen Saturation % 93 % 96 %    1 Minute Liters of Oxygen 0 L  Room Air 0 L  Room Air    2 Minute Oxygen Saturation % 92 % 95 %    2 Minute Liters of Oxygen 0 L 0 L    3 Minute Oxygen Saturation % 93 % 95 %    3 Minute Liters of Oxygen 0 L 0 L    4 Minute Oxygen Saturation % 93 % 95 %    4 Minute Liters of Oxygen 0 L 0 L    5 Minute Oxygen Saturation % 93 % 95 %    5 Minute Liters of Oxygen 0 L 0 L    6 Minute Oxygen Saturation % 93 % 95 %    6 Minute Liters of Oxygen 0 L 0 L    2 Minute Post  Oxygen Saturation % 94 % 99 %    2 Minute Post Liters of Oxygen 0 L 0 L            Thank you for the referral

## 2022-12-29 NOTE — Progress Notes (Signed)
Pulmonary Individual Treatment Plan  Patient Details  Name: Gabrielle Pineda MRN: [id number] Date of Birth: 07-22-1956 Referring Provider:   Flowsheet Row Pulmonary Rehab from 07/27/2022 in [facility] Cardiac and Pulmonary Rehab  Referring Provider Sarina Ser MD       Initial Encounter Date:  Flowsheet Row Pulmonary Rehab from 07/27/2022 in [facility] Cardiac and Pulmonary Rehab  Date 07/27/22       Visit Diagnosis: Chronic obstructive pulmonary disease, unspecified COPD type (HCC)  Patient's Home Medications on Admission:  Current Outpatient Medications:    acyclovir ointment (ZOVIRAX) 5 %, :Topical Every 3 Hours as needed for lesion, Disp: 30 g, Rfl: 1   albuterol (PROVENTIL) (2.5 MG/3ML) 0.083% nebulizer solution, Take 3 mLs (2.5 mg total) by nebulization every 4 (four) hours as needed for wheezing or shortness of breath., Disp: 225 mL, Rfl: 2   albuterol (VENTOLIN HFA) 108 (90 Base) MCG/ACT inhaler, INHALE TWO PUFFS BY MOUTH INTO LUNGS every SIX hours AS NEEDED FOR WHEEZING AND/OR SHORTNESS OF BREATH, Disp: 8.5 g, Rfl: 2   ARIPiprazole (ABILIFY) 2 MG tablet, Take 1 tablet (2 mg total) by mouth daily. Stop Abilify 5 mg - dose change, Disp: 30 tablet, Rfl: 1   atorvastatin (LIPITOR) 40 MG tablet, TAKE ONE TABLET BY MOUTH EVERY MORNING, Disp: 90 tablet, Rfl: 0   Budeson-Glycopyrrol-Formoterol (BREZTRI AEROSPHERE) 160-9-4.8 MCG/ACT AERO, Inhale 2 puffs into the lungs in the morning and at bedtime., Disp: 10.7 g, Rfl: 11   carvedilol (COREG) 12.5 MG tablet, TAKE ONE TABLET BY MOUTH EVERY MORNING and TAKE ONE TABLET BY MOUTH EVERYDAY AT BEDTIME, Disp: 180 tablet, Rfl: 0   ezetimibe (ZETIA) 10 MG tablet, TAKE ONE TABLET BY MOUTH EVERY MORNING, Disp: 90 tablet, Rfl: 0   fexofenadine (ALLEGRA) 180 MG tablet, Take 180 mg by mouth daily., Disp: , Rfl:    furosemide (LASIX) 20 MG tablet, Take 1 tablet (20 mg total) by mouth daily., Disp: 90 tablet, Rfl: 0   gabapentin (NEURONTIN) 400 MG capsule,  Take 1 capsule (400 mg total) by mouth 2 (two) times daily., Disp: 60 capsule, Rfl: 5   ipratropium (ATROVENT) 0.03 % nasal spray, Place 2 sprays into both nostrils every 12 (twelve) hours., Disp: 30 mL, Rfl: 12   leflunomide (ARAVA) 20 MG tablet, Take 20 mg by mouth at bedtime., Disp: , Rfl:    LINZESS 145 MCG CAPS capsule, Take 145 mcg by mouth daily before breakfast., Disp: , Rfl:    losartan (COZAAR) 25 MG tablet, Take 1 tablet (25 mg total) by mouth daily., Disp: 90 tablet, Rfl: 1   meloxicam (MOBIC) 15 MG tablet, Take 15 mg by mouth at bedtime., Disp: , Rfl:    methylPREDNISolone (MEDROL DOSEPAK) 4 MG TBPK tablet, Take as directed in the package., Disp: 21 tablet, Rfl: 0   mirtazapine (REMERON) 15 MG tablet, TAKE ONE TABLET BY MOUTH EVERYDAY AT BEDTIME, Disp: 90 tablet, Rfl: 0   montelukast (SINGULAIR) 10 MG tablet, TAKE ONE TABLET BY MOUTH EVERYDAY AT BEDTIME, Disp: 30 tablet, Rfl: 11   Multiple Vitamin (MULTIVITAMIN WITH MINERALS) TABS tablet, Take 1 tablet by mouth daily., Disp: , Rfl:    Nebulizer MISC, Compressor nebulizer to use with nebulized medications as instructed, Disp: 1 each, Rfl: 0   omeprazole (PRILOSEC) 40 MG capsule, TAKE ONE CAPSULE BY MOUTH EVERY MORNING and TAKE ONE CAPSULE BY MOUTH EVERYDAY AT BEDTIME, Disp: 180 capsule, Rfl: 1   oxyCODONE-acetaminophen (PERCOCET) 5-325 MG tablet, Take 1 tablet by mouth every 8 (eight)  hours as needed for severe pain. Must last 30 days., Disp: 90 tablet, Rfl: 0   oxyCODONE-acetaminophen (PERCOCET) 5-325 MG tablet, Take 1 tablet by mouth every 8 (eight) hours as needed for severe pain. Must last 30 days., Disp: 90 tablet, Rfl: 0   Oxymetazoline HCl (MUCINEX NASAL SPRAY MOISTURE NA), Place into the nose., Disp: , Rfl:    potassium chloride SA (KLOR-CON M) 20 MEQ tablet, TAKE ONE TABLET BY MOUTH EVERY MORNING, Disp: 90 tablet, Rfl: 1   predniSONE (DELTASONE) 20 MG tablet, Take 20 mg by mouth 2 (two) times daily., Disp: , Rfl:    SPIKEVAX  syringe, , Disp: , Rfl:    topiramate (TOPAMAX) 25 MG tablet, Take 1 tablet (25 mg total) by mouth 2 (two) times daily., Disp: 60 tablet, Rfl: 11   valACYclovir (VALTREX) 1000 MG tablet, TAKE TWO TABLETS BY MOUTH TWICE DAILY AS NEEDED FOR 5 DAYS FOR FEVER blisters, Disp: 30 tablet, Rfl: 1   venlafaxine XR (EFFEXOR-XR) 75 MG 24 hr capsule, TAKE THREE CAPSULES BY MOUTH EVERY MORNING, Disp: 270 capsule, Rfl: 0   XIIDRA 5 % SOLN, Place 1 drop into both eyes daily., Disp: , Rfl:   Past Medical History: Past Medical History:  Diagnosis Date   Alcohol abuse    Allergy 1983   Anemia    Anxiety    Arthritis    Cataract    Cervicalgia    Cirrhosis (HCC) 1994   COPD (chronic obstructive pulmonary disease) (HCC)    Depression    Difficult intubation    has plates and screws in neck   Dyspnea    GERD (gastroesophageal reflux disease)    Headache    Heart murmur    on heard when pt is lying   Hypertension    Neuromuscular disorder (HCC)    Neuropathy   Other and unspecified hyperlipidemia    Oxygen deficiency    Status post insertion of spinal cord stimulator    Tachycardia    d/t questionable anxiety happens every 5- 10 years, sees [facility]    Tobacco Use: Social History   Tobacco Use  Smoking Status Former   Packs/day: 2.00   Years: 45.00   Additional pack years: 0.00   Total pack years: 90.00   Types: Cigarettes   Quit date: 10/28/2020   Years since quitting: 2.1  Smokeless Tobacco Never  Tobacco Comments   Ive been quit for about a year now.    Labs: Review Flowsheet  More data exists      Latest Ref Rng & Units 04/19/2020 03/17/2021 06/26/2021 11/17/2021 11/23/2022  Labs for ITP Cardiac and Pulmonary Rehab  Cholestrol 100 - 199 mg/dL 161  - 096  045  409   LDL (calc) 0 - 99 mg/dL 98  - 50  79  78   HDL-C >39 mg/dL 56  - 39  40  59   Trlycerides 0 - 149 mg/dL 811  - 914  782  956   Hemoglobin A1c 4.8 - 5.6 % - - 5.3  - 5.3   TCO2 22 - 32 mmol/L - 21  - - -      Pulmonary Assessment Scores:  Pulmonary Assessment Scores     Row Name 07/27/22 1435 12/10/22 1412       ADL UCSD   ADL Phase Entry Exit    SOB Score total 71 27    Rest 0 0    Walk 2 0    Stairs 5  1    Bath 2 0    Dress 3 0    Shop 4 1      CAT Score   CAT Score 17 5      mMRC Score   mMRC Score 2 --             UCSD: Self-administered rating of dyspnea associated with activities of daily living (ADLs) 6-point scale (0 = "not at all" to 5 = "maximal or unable to do because of breathlessness")  Scoring Scores range from 0 to 120.  Minimally important difference is 5 units  CAT: CAT can identify the health impairment of COPD patients and is better correlated with disease progression.  CAT has a scoring range of zero to 40. The CAT score is classified into four groups of low (less than 10), medium (10 - 20), high (21-30) and very high (31-40) based on the impact level of disease on health status. A CAT score over 10 suggests significant symptoms.  A worsening CAT score could be explained by an exacerbation, poor medication adherence, poor inhaler technique, or progression of COPD or comorbid conditions.  CAT MCID is 2 points  mMRC: mMRC (Modified Medical Research Council) Dyspnea Scale is used to assess the degree of baseline functional disability in patients of respiratory disease due to dyspnea. No minimal important difference is established. A decrease in score of 1 point or greater is considered a positive change.   Pulmonary Function Assessment:  Pulmonary Function Assessment - 07/27/22 1433       Pulmonary Function Tests   FVC% 64 %   Collected 02/04/22   FEV1% 54 %    FEV1/FVC Ratio 64    RV% 144 %    DLCO% 51 %      Breath   Shortness of Breath Yes;Limiting activity;Fear of Shortness of Breath             Exercise Target Goals: Exercise Program Goal: Individual exercise prescription set using results from initial 6 min walk test and THRR  while considering  patient's activity barriers and safety.   Exercise Prescription Goal: Initial exercise prescription builds to 30-45 minutes a day of aerobic activity, 2-3 days per week.  Home exercise guidelines will be given to patient during program as part of exercise prescription that the participant will acknowledge.  Education: Aerobic Exercise: - Group verbal and visual presentation on the components of exercise prescription. Introduces F.I.T.T principle from ACSM for exercise prescriptions.  Reviews F.I.T.T. principles of aerobic exercise including progression. Written material given at graduation. Flowsheet Row Pulmonary Rehab from 12/10/2022 in [facility] Cardiac and Pulmonary Rehab  Date 11/12/22  Educator [facility]  Instruction Review Code 1- Verbalizes Understanding       Education: Resistance Exercise: - Group verbal and visual presentation on the components of exercise prescription. Introduces F.I.T.T principle from ACSM for exercise prescriptions  Reviews F.I.T.T. principles of resistance exercise including progression. Written material given at graduation. Flowsheet Row Pulmonary Rehab from 12/10/2022 in [facility] Cardiac and Pulmonary Rehab  Date 09/10/22  Educator [facility]  Instruction Review Code 1- Verbalizes Understanding        Education: Exercise & Equipment Safety: - Individual verbal instruction and demonstration of equipment use and safety with use of the equipment. Flowsheet Row Pulmonary Rehab from 12/10/2022 in [facility] Cardiac and Pulmonary Rehab  Date 07/16/22  Educator [facility]  Instruction Review Code 1- Verbalizes Understanding       Education: Exercise Physiology & General Exercise Guidelines: - Group  verbal and written instruction with models to review the exercise physiology of the cardiovascular system and associated critical values. Provides general exercise guidelines with specific guidelines to those with heart or lung disease.  Flowsheet Row Pulmonary Rehab from  12/10/2022 in [facility] Cardiac and Pulmonary Rehab  Date 11/05/22  Educator [facility]  Instruction Review Code 1- Verbalizes Understanding       Education: Flexibility, Balance, Mind/Body Relaxation: - Group verbal and visual presentation with interactive activity on the components of exercise prescription. Introduces F.I.T.T principle from ACSM for exercise prescriptions. Reviews F.I.T.T. principles of flexibility and balance exercise training including progression. Also discusses the mind body connection.  Reviews various relaxation techniques to help reduce and manage stress (i.e. Deep breathing, progressive muscle relaxation, and visualization). Balance handout provided to take home. Written material given at graduation. Flowsheet Row Pulmonary Rehab from 12/10/2022 in [facility] Cardiac and Pulmonary Rehab  Date 09/17/22  Educator [facility]  Instruction Review Code 1- Verbalizes Understanding       Activity Barriers & Risk Stratification:   6 Minute Walk:  6 Minute Walk     Row Name 07/27/22 1425 12/08/22 1032       6 Minute Walk   Phase Initial Discharge    Distance 1035 feet 1215 feet    Distance % Change -- 17.4 %    Distance Feet Change -- 180 ft    Walk Time 6 minutes 6 minutes    # of Rest Breaks 0 0    MPH 1.96 2.3    METS 3.24 3.54    RPE 14 13    Perceived Dyspnea  2 1    VO2 Peak 11.33 12.41    Symptoms Yes (comment) Yes (comment)    Comments Hip pain 6/10 SOB    Resting HR 70 bpm 75 bpm    Resting BP 126/70 146/70    Resting Oxygen Saturation  94 % 97 %    Exercise Oxygen Saturation  during 6 min walk 92 % 95 %    Max Ex. HR 110 bpm 110 bpm    Max Ex. BP 184/74 182/76    2 Minute Post BP 154/72 140/74      Interval HR   1 Minute HR 96 95    2 Minute HR 100 105    3 Minute HR 99 108    4 Minute HR 97 109    5 Minute HR 103 108    6 Minute HR 110 110    2 Minute Post HR 77 77    Interval Heart Rate? Yes Yes      Interval Oxygen   Interval Oxygen? Yes Yes    Baseline  Oxygen Saturation % 94 % 97 %    1 Minute Oxygen Saturation % 93 % 96 %    1 Minute Liters of Oxygen 0 L  Room Air 0 L  Room Air    2 Minute Oxygen Saturation % 92 % 95 %    2 Minute Liters of Oxygen 0 L 0 L    3 Minute Oxygen Saturation % 93 % 95 %    3 Minute Liters of Oxygen 0 L 0 L    4 Minute Oxygen Saturation % 93 % 95 %    4 Minute Liters of Oxygen 0 L 0 L    5 Minute Oxygen Saturation % 93 % 95 %    5 Minute Liters of Oxygen 0 L 0 L    6 Minute Oxygen  Saturation % 93 % 95 %    6 Minute Liters of Oxygen 0 L 0 L    2 Minute Post Oxygen Saturation % 94 % 99 %    2 Minute Post Liters of Oxygen 0 L 0 L            Oxygen Initial Assessment:  Oxygen Initial Assessment - 12/15/22 0958       Home Oxygen   Home Oxygen Device Home Concentrator    Sleep Oxygen Prescription Continuous    Liters per minute 2    Home Exercise Oxygen Prescription None    Home Resting Oxygen Prescription None    Compliance with Home Oxygen Use Yes      Initial 6 min Walk   Oxygen Used None      Program Oxygen Prescription   Program Oxygen Prescription None    Liters per minute 2    Comments sleep only      Intervention   Short Term Goals To learn and demonstrate proper pursed lip breathing techniques or other breathing techniques. ;To learn and understand importance of maintaining oxygen saturations>88%;To learn and demonstrate proper use of respiratory medications    Long  Term Goals Exhibits proper breathing techniques, such as pursed lip breathing or other method taught during program session;Maintenance of O2 saturations>88%;Compliance with respiratory medication             Oxygen Re-Evaluation:  Oxygen Re-Evaluation     Row Name 08/04/22 0953 09/01/22 0945 09/17/22 1204 10/13/22 0950 11/17/22 0937     Program Oxygen Prescription   Program Oxygen Prescription Continuous Continuous Continuous None --   Liters per minute 2 2 2  -- --   Comments sleep only sleep only sleep only --  --     Home Oxygen   Home Oxygen Device Home Concentrator Home Concentrator Home Concentrator Home Concentrator --   Sleep Oxygen Prescription Continuous Continuous Continuous Continuous --   Liters per minute 2 2 2 2  --   Home Exercise Oxygen Prescription None None None None --   Home Resting Oxygen Prescription None None None None --   Compliance with Home Oxygen Use Yes Yes Yes Yes --     Goals/Expected Outcomes   Short Term Goals To learn and demonstrate proper pursed lip breathing techniques or other breathing techniques.  To learn and demonstrate proper pursed lip breathing techniques or other breathing techniques.  To learn and demonstrate proper pursed lip breathing techniques or other breathing techniques.  To learn and demonstrate proper pursed lip breathing techniques or other breathing techniques.  --   Long  Term Goals Exhibits proper breathing techniques, such as pursed lip breathing or other method taught during program session Exhibits proper breathing techniques, such as pursed lip breathing or other method taught during program session Exhibits proper breathing techniques, such as pursed lip breathing or other method taught during program session Exhibits proper breathing techniques, such as pursed lip breathing or other method taught during program session --   Comments Reviewed PLB technique with pt.  Talked about how it works and it's importance in maintaining their exercise saturations. Lacresha is still on 2L of O2 during sleep. She reports that she used her oxygen more during the day while she was sick, but she has returned to only using it during sleep. We reviewd PLB technique and she was encouraged to practice PLB during exercise. She has a pulse-ox at home and she states that she has been checking her O2  saturations at home. She states that her O2 stayed around 92% while she was sick, but since she has gotten better it has been ranging between 95-96% at rest. Diaphragmatic and  PLB breathing explained and performed with patient. Patient has a better understanding of how to do these exercises to help with breathing performance and relaxation. Patient performed breathing techniques adequately and to practice further at home. Daralyn reports her shortness of breath has improved since starting the program. She practices PLB and finds that it helps, she has a pulse oximeter at home; reviewed what to look for and troubleshooting. Andres believes she is not wheezing as much and feels her endurance has improved. She is not always using her pulse ox but reviewed the importance of checking it and ensuring her oxygen above 88%. She still uses PLB when needed.   Goals/Expected Outcomes Short: Become more profiecient at using PLB. Long: Become independent at using PLB. Short: Become more profiecient at using PLB. Long: Become independent at using PLB. Short: practice PLB and diaphragmatic breathing at home. Long: Use PLB and diaphragmatic breathing independently post [employer]. Short: practice PLB and diaphragmatic breathing at home. Long: Use PLB and diaphragmatic breathing independently post [employer]. Short: Start using pulse ox at home, monitor HR and O2 Long: Become proficient at PLB long-term    Row Name 12/15/22 0958             Goals/Expected Outcomes   Comments Eliyanah still continues to see improvement with her breathing. She has been doing well walking on the track here which she states she wouldn't have been able to do before she started the program. She has been staying complaint with using her pulse ox to check her vitals. She is continuing to take all of her medications and stays compliant with her sleep prescription. She continues PLB. She is getting ready to graduate in the next couple of weeks!       Goals/Expected Outcomes Short: Graduate Long: Continue long-term monitoring of HR, O2, and PLB. Stay compliant with medication adherence                Oxygen Discharge  (Final Oxygen Re-Evaluation):  Oxygen Re-Evaluation - 12/15/22 0958       Goals/Expected Outcomes   Comments Sheral still continues to see improvement with her breathing. She has been doing well walking on the track here which she states she wouldn't have been able to do before she started the program. She has been staying complaint with using her pulse ox to check her vitals. She is continuing to take all of her medications and stays compliant with her sleep prescription. She continues PLB. She is getting ready to graduate in the next couple of weeks!    Goals/Expected Outcomes Short: Graduate Long: Continue long-term monitoring of HR, O2, and PLB. Stay compliant with medication adherence             Initial Exercise Prescription:  Initial Exercise Prescription - 07/27/22 1400       Date of Initial Exercise RX and Referring Provider   Date 07/27/22    Referring Provider Sarina Ser MD      Oxygen   Maintain Oxygen Saturation 88% or higher      Recumbant Bike   Level 2    RPM 50    Watts 30    Minutes 15    METs 3      NuStep   Level 3    SPM 80  Minutes 15    METs 3      REL-XR   Level 2    Speed 50    Minutes 15    METs 3      Track   Laps 27    Minutes 15    METs 2.47      Prescription Details   Frequency (times per week) 2    Duration Progress to 30 minutes of continuous aerobic without signs/symptoms of physical distress      Intensity   THRR 40-80% of Max Heartrate 104-138    Ratings of Perceived Exertion 11-13    Perceived Dyspnea 0-4      Progression   Progression Continue to progress workloads to maintain intensity without signs/symptoms of physical distress.      Resistance Training   Training Prescription Yes    Weight 3 lb    Reps 10-15             Perform Capillary Blood Glucose checks as needed.  Exercise Prescription Changes:   Exercise Prescription Changes     Row Name 07/27/22 1400 08/13/22 1200 09/08/22 1400  09/22/22 0800 09/22/22 0900     Response to Exercise   Blood Pressure (Admit) 126/70 118/70 126/84 142/90 --   Blood Pressure (Exercise) 184/74 132/80 130/86 152/88 --   Blood Pressure (Exit) 128/70 122/76 122/74 126/84 --   Heart Rate (Admit) 70 bpm 73 bpm 70 bpm 80 bpm --   Heart Rate (Exercise) 110 bpm 108 bpm 95 bpm 95 bpm --   Heart Rate (Exit) 77 bpm 73 bpm 81 bpm 82 bpm --   Oxygen Saturation (Admit) 94 % 90 % 97 % 97 % --   Oxygen Saturation (Exercise) 92 % 98 % 93 % 92 % --   Oxygen Saturation (Exit) 93 % 97 % 96 % 94 % --   Rating of Perceived Exertion (Exercise) 14 15 15 15  --   Perceived Dyspnea (Exercise) 2 2 2 2  --   Symptoms hip pain 6/10 none SOB SOB --   Comments walk test resutls third full day of exercise -- -- --   Duration -- Progress to 30 minutes of  aerobic without signs/symptoms of physical distress Continue with 30 min of aerobic exercise without signs/symptoms of physical distress. Continue with 30 min of aerobic exercise without signs/symptoms of physical distress. --   Intensity -- THRR unchanged THRR unchanged THRR unchanged --     Progression   Progression -- Continue to progress workloads to maintain intensity without signs/symptoms of physical distress. Continue to progress workloads to maintain intensity without signs/symptoms of physical distress. Continue to progress workloads to maintain intensity without signs/symptoms of physical distress. --   Average METs -- 2.74 2.8 2.96 --     Resistance Training   Training Prescription -- Yes Yes Yes --   Weight -- 3 lb 3 lb 3 lb --   Reps -- 10-15 10-15 10-15 --     Interval Training   Interval Training -- No No No --     Recumbant Bike   Level -- 2 2 1.5 --   Watts -- -- 31 31 --   Minutes -- 15 15 15  --   METs -- 2.3 2.1 2.2 --     NuStep   Level -- 3 3 4  --   Minutes -- 15 15 15  --   METs -- 2.9 -- 3 --     REL-XR   Level -- 2 1  2 --   Minutes -- 15 15 15  --   METs -- 3.1 3 3.6 --      Track   Laps -- 30 30 30  --   Minutes -- 15 15 15  --   METs -- 2.63 2.63 2.63 --     Home Exercise Plan   Plans to continue exercise at -- -- -- -- Home (comment)  walking   Frequency -- -- -- -- Add 2 additional days to program exercise sessions.   Initial Home Exercises Provided -- -- -- -- 09/22/22     Oxygen   Maintain Oxygen Saturation -- 88% or higher 88% or higher 88% or higher --    Row Name 10/06/22 1400 10/21/22 1300 11/18/22 1500 12/01/22 1500 12/15/22 1500     Response to Exercise   Blood Pressure (Admit) 128/84 128/84 118/62 124/82 108/60   Blood Pressure (Exercise) 136/64 136/74 -- -- --   Blood Pressure (Exit) 132/72 120/70 122/70 110/68 96/58   Heart Rate (Admit) 70 bpm 75 bpm 71 bpm 74 bpm 79 bpm   Heart Rate (Exercise) 95 bpm 96 bpm 111 bpm 99 bpm 114 bpm   Heart Rate (Exit) 83 bpm 87 bpm 82 bpm 85 bpm 89 bpm   Oxygen Saturation (Admit) 96 % 97 % 98 % 96 % 90 %   Oxygen Saturation (Exercise) 94 % 91 % 93 % 91 % 88 %   Oxygen Saturation (Exit) 96 % 98 % 96 % 92 % 96 %   Rating of Perceived Exertion (Exercise) 16 16 15 14 16    Perceived Dyspnea (Exercise) 3 1 1 1 1    Symptoms SOB SOB SOB SOB SOB   Duration Continue with 30 min of aerobic exercise without signs/symptoms of physical distress. Continue with 30 min of aerobic exercise without signs/symptoms of physical distress. Continue with 30 min of aerobic exercise without signs/symptoms of physical distress. Continue with 30 min of aerobic exercise without signs/symptoms of physical distress. Continue with 30 min of aerobic exercise without signs/symptoms of physical distress.   Intensity THRR unchanged THRR unchanged THRR unchanged THRR unchanged THRR unchanged     Progression   Progression Continue to progress workloads to maintain intensity without signs/symptoms of physical distress. Continue to progress workloads to maintain intensity without signs/symptoms of physical distress. Continue to progress workloads to  maintain intensity without signs/symptoms of physical distress. Continue to progress workloads to maintain intensity without signs/symptoms of physical distress. Continue to progress workloads to maintain intensity without signs/symptoms of physical distress.   Average METs 3.05 2.93 2.99 3.04 3.04     Resistance Training   Training Prescription Yes Yes Yes Yes Yes   Weight 3 lb 3 lb 3 lb 3 lb 3 lb   Reps 10-15 10-15 10-15 10-15 10-15     Interval Training   Interval Training No No No No No     Recumbant Bike   Level 2 2 2  -- 2   Watts 31 31 31  -- 31   Minutes 15 15 15  -- 15   METs 3.23 3.26 2.8 -- 3.23     NuStep   Level 4 2 2 2 2    Minutes 15 15 15 15 15    METs 2.2 2.5 2.6 2.6 2.7     Recumbant Elliptical   Level -- -- -- -- 1   Minutes -- -- -- -- 15   METs -- -- -- -- 1.4     REL-XR   Level 2  2 2 2 2    Minutes 15 15 15 15 15    METs 4.6 3.6 3.1 4 4.2     Track   Laps 30 30 30 25 30    Minutes 15 15 15 15 15    METs 2.63 2.63 2.63 2.36 2.63     Home Exercise Plan   Plans to continue exercise at Home (comment)  walking Home (comment)  walking Home (comment)  walking Home (comment)  walking Home (comment)  walking   Frequency Add 2 additional days to program exercise sessions. Add 2 additional days to program exercise sessions. Add 2 additional days to program exercise sessions. Add 2 additional days to program exercise sessions. Add 2 additional days to program exercise sessions.   Initial Home Exercises Provided 09/22/22 09/22/22 09/22/22 09/22/22 09/22/22     Oxygen   Maintain Oxygen Saturation 88% or higher 88% or higher 88% or higher 88% or higher 88% or higher            Exercise Comments:   Exercise Comments     Row Name 08/04/22 1610 12/29/22 1038         Exercise Comments First full day of exercise!  Patient was oriented to gym and equipment including functions, settings, policies, and procedures.  Patient's individual exercise prescription and  treatment plan were reviewed.  All starting workloads were established based on the results of the 6 minute walk test done at initial orientation visit.  The plan for exercise progression was also introduced and progression will be customized based on patient's performance and goals. Caroleen graduated today from  rehab with 36 sessions completed.  Details of the patient's exercise prescription and what She needs to do in order to continue the prescription and progress were discussed with patient.  Patient was given a copy of prescription and goals.  Patient verbalized understanding.  Florabel plans to continue to exercise by exercising at home..               Exercise Goals and Review:   Exercise Goals     Row Name 07/27/22 1431             Exercise Goals   Increase Physical Activity Yes       Intervention Provide advice, education, support and counseling about physical activity/exercise needs.;Develop an individualized exercise prescription for aerobic and resistive training based on initial evaluation findings, risk stratification, comorbidities and participant's personal goals.       Expected Outcomes Long Term: Exercising regularly at least 3-5 days a week.;Short Term: Attend rehab on a regular basis to increase amount of physical activity.;Long Term: Add in home exercise to make exercise part of routine and to increase amount of physical activity.       Increase Strength and Stamina Yes       Intervention Provide advice, education, support and counseling about physical activity/exercise needs.;Develop an individualized exercise prescription for aerobic and resistive training based on initial evaluation findings, risk stratification, comorbidities and participant's personal goals.       Expected Outcomes Short Term: Increase workloads from initial exercise prescription for resistance, speed, and METs.;Short Term: Perform resistance training exercises routinely during rehab and add in  resistance training at home;Long Term: Improve cardiorespiratory fitness, muscular endurance and strength as measured by increased METs and functional capacity ( )       Able to understand and use rate of perceived exertion (RPE) scale Yes       Intervention Provide education and explanation on  how to use RPE scale       Expected Outcomes Short Term: Able to use RPE daily in rehab to express subjective intensity level;Long Term:  Able to use RPE to guide intensity level when exercising independently       Able to understand and use Dyspnea scale Yes       Intervention Provide education and explanation on how to use Dyspnea scale       Expected Outcomes Short Term: Able to use Dyspnea scale daily in rehab to express subjective sense of shortness of breath during exertion;Long Term: Able to use Dyspnea scale to guide intensity level when exercising independently       Knowledge and understanding of Target Heart Rate Range (THRR) Yes       Intervention Provide education and explanation of THRR including how the numbers were predicted and where they are located for reference       Expected Outcomes Short Term: Able to state/look up THRR;Long Term: Able to use THRR to govern intensity when exercising independently;Short Term: Able to use daily as guideline for intensity in rehab       Able to check pulse independently Yes       Intervention Provide education and demonstration on how to check pulse in carotid and radial arteries.;Review the importance of being able to check your own pulse for safety during independent exercise       Expected Outcomes Short Term: Able to explain why pulse checking is important during independent exercise;Long Term: Able to check pulse independently and accurately       Understanding of Exercise Prescription Yes       Intervention Provide education, explanation, and written materials on patient's individual exercise prescription       Expected Outcomes Short Term: Able to  explain program exercise prescription;Long Term: Able to explain home exercise prescription to exercise independently                Exercise Goals Re-Evaluation :  Exercise Goals Re-Evaluation     Row Name 08/04/22 0956 08/13/22 1245 08/25/22 0824 09/01/22 0925 09/08/22 1417     Exercise Goal Re-Evaluation   Exercise Goals Review Able to understand and use rate of perceived exertion (RPE) scale;Able to understand and use Dyspnea scale;Knowledge and understanding of Target Heart Rate Range (THRR);Understanding of Exercise Prescription Understanding of Exercise Prescription;Increase Strength and Stamina;Increase Physical Activity Understanding of Exercise Prescription;Increase Strength and Stamina;Increase Physical Activity Understanding of Exercise Prescription;Increase Strength and Stamina;Increase Physical Activity Understanding of Exercise Prescription;Increase Strength and Stamina;Increase Physical Activity   Comments Reviewed RPE  and dyspnea scale, THR and program prescription with pt today.  Pt voiced understanding and was given a copy of goals to take home. Laurana is off to a good start in rehab.  She has completed her first three full days of exercise thus far.  She is already up to 30 laps!!  We will continue to monitor her progress. Alyscia has not attended rehab since the last review. We will contact pt and remind her of the importance of good attendance in the program. We will continue to monitor when she returns. Kelce feels that she is making good progress in the program. She states that getting sick did set her back a little bit, but she is excited about getting back to her exercise routine. She states that the main benefits she has seen have been improved strength, she has lost some weight, and she feels better overall. She also states  that she has been walking at home on her days away from rehab for 45 minutes. She reports walking at home about three days a week. Zyia is doing well in  rehab as she has returned from being out a bit. As she is getting back into the routine, she was able to walk a full 30 laps around the track and increased her watts on the recumbent bike to 31 watts. Her XR stayed at a level 1 and we will encourage her to inrease it back up. Will continue to monitor.   Expected Outcomes Short: Use RPE daily to regulate intensity. Long: Follow program prescription in THR. Short: Continue to attend rehab regularly Long: Continue to follow program prescription Short: Return to regular attendance in the program. Long: Continue to follow program prescription. Short: Continue to walk on days away from rehab. Long: Continue to improve strength and stamina. Short: Increase level to 2 on the XR Long: Continue to increase overall stamin and MET level    Row Name 09/22/22 0850 09/22/22 0933 10/06/22 1435 10/13/22 0958 10/21/22 1404     Exercise Goal Re-Evaluation   Exercise Goals Review Understanding of Exercise Prescription;Increase Strength and Stamina;Increase Physical Activity Understanding of Exercise Prescription;Increase Strength and Stamina;Increase Physical Activity;Knowledge and understanding of Target Heart Rate Range (THRR);Able to understand and use rate of perceived exertion (RPE) scale;Able to understand and use Dyspnea scale;Able to check pulse independently Increase Physical Activity;Increase Strength and Stamina;Understanding of Exercise Prescription Increase Physical Activity;Increase Strength and Stamina;Understanding of Exercise Prescription Increase Physical Activity;Increase Strength and Stamina;Understanding of Exercise Prescription   Comments Austyn is doing well in rehab. She recently increased her overall average MET level to 2.96 METs. She also improved up to level 4 on the T4 Nustep and back up to level 2 on the XR. She has conistently walked 30 laps on the track as well, and could push for more. We will continue to monitor her progress in the program.  Reviewed home exercise with pt today.  Pt plans to walk at home for exercise.  Reviewed THR, pulse, RPE, sign and symptoms, pulse oximetery and when to call 911 or MD.  Also discussed weather considerations and indoor options.  Pt voiced understanding. Samhitha continues to do well in rehab. She has stayed pretty consistent with her workloads on the seated machines such as the T4 Nustep, XR, and Biostep. She also hits consistently walking 30 laps on track and not quite hitting her THR. Staff will encourage patient to start increasing her laps and workloads. Will continue to monitor. Alexandr reports walking at home when not at rehab - for about 20 minutes (RPE 13)(shortness of breath is only slight) 3x/week. Encouraged to continue walking at home. Anetha continues to do well in the program. She has stayed consistent with her workloads on seated machines. She has also continued to walk 30 laps on the track, however, we will encourage her to continue to push for more. She has done well with 3 lb hand weights for resistance training as well. We will continue to monitor her progress in the program.   Expected Outcomes Short: Continue to push for more laps on the track. Long: Continue to improve strength and stamina. Short: Start to add in more walking at home Long: Continue to improve stamina Short: Increase laps on track beyond 30, increase to level 3 on the XR Long: Continue to increase overall MET level and stamina -- Short: Continue to push for more laps beyond 30. Long: Continue  to improve strength and stamina.    Row Name 11/17/22 1610 11/18/22 1527 12/01/22 1547 12/15/22 0921 12/15/22 1518     Exercise Goal Re-Evaluation   Exercise Goals Review Increase Physical Activity;Increase Strength and Stamina;Understanding of Exercise Prescription Increase Physical Activity;Increase Strength and Stamina;Understanding of Exercise Prescription Increase Physical Activity;Increase Strength and Stamina;Understanding of Exercise  Prescription Increase Physical Activity;Increase Strength and Stamina;Understanding of Exercise Prescription Increase Physical Activity;Increase Strength and Stamina;Understanding of Exercise Prescription   Comments Ovetta continues to  walk at home for about  20 minutes, we encouraged her to increase her duration to 30 minutes for each session, slowly and progressively. She walks about 5 days/ week, She does not normally check her HR or O2 but encouraged her to bring her pulse ox with her to monitor her vitals. Tamryn is doing well in rehab. She has increased her overall average MET level back up to 2.99 METs. She also has consistently walked 30 laps on the track, and has kept her workloads consistent on all of her seated machines. She also has continued to use 3 lb hand weights for resistance training. We will continue to monitor her progress in the program. Merary continues to do well in rehab. She completed 25-30 laps on average on the track. She could benefit from increasing her workload on the T4 Nustep beyond level 2. She continues to maintain oxygen saturation above 88%. She did reach over 4 METS on the XR! She is due for her post and we hope to see improvement. Will continue to monitor. Immaculate improved on her post by 17.4%! She will be graduating in the next couple of weeks. She plans on walking at home as her exercise post grad. We reviewed weather restrictions again. Her sister is trying to get her to join a gym, and we talked about how beneficial it may be that she can access seated machines, that would help with her back and hip pain. She will continue to monitor her HR and O2 using her pulse ox at home. Azile is doing well in rehab and is close to graduating. She recently completed her post and improved by 17.4%! She also began using the recumbent elliptical and did well at level 1. She has continued to work at level 2 on the XR, T4 nustep, and recumbent bike as well. We will continue to  monitor her progress until she graduates from the program.   Expected Outcomes Short: Increase duration of exercise to minimum 30 minutes, work up to it Long: Continue to exercise independently at home Short: Progressively increase workloads, push for more laps on track. Long: Continue to improve strength and stamina. Short: Improve on post Long: Continue to increase overall MET level and stamina Short: Graduate Long: Continue to exercise independently at home Short: Graduate. Long: Continue to exercise independently at home.            Discharge Exercise Prescription (Final Exercise Prescription Changes):  Exercise Prescription Changes - 12/15/22 1500       Response to Exercise   Blood Pressure (Admit) 108/60    Blood Pressure (Exit) 96/58    Heart Rate (Admit) 79 bpm    Heart Rate (Exercise) 114 bpm    Heart Rate (Exit) 89 bpm    Oxygen Saturation (Admit) 90 %    Oxygen Saturation (Exercise) 88 %    Oxygen Saturation (Exit) 96 %    Rating of Perceived Exertion (Exercise) 16    Perceived Dyspnea (Exercise) 1  Symptoms SOB    Duration Continue with 30 min of aerobic exercise without signs/symptoms of physical distress.    Intensity THRR unchanged      Progression   Progression Continue to progress workloads to maintain intensity without signs/symptoms of physical distress.    Average METs 3.04      Resistance Training   Training Prescription Yes    Weight 3 lb    Reps 10-15      Interval Training   Interval Training No      Recumbant Bike   Level 2    Watts 31    Minutes 15    METs 3.23      NuStep   Level 2    Minutes 15    METs 2.7      Recumbant Elliptical   Level 1    Minutes 15    METs 1.4      REL-XR   Level 2    Minutes 15    METs 4.2      Track   Laps 30    Minutes 15    METs 2.63      Home Exercise Plan   Plans to continue exercise at Home (comment)   walking   Frequency Add 2 additional days to program exercise sessions.    Initial  Home Exercises Provided 09/22/22      Oxygen   Maintain Oxygen Saturation 88% or higher             Nutrition:  Target Goals: Understanding of nutrition guidelines, daily intake of sodium 1500mg , cholesterol 200mg , calories 30% from fat and 7% or less from saturated fats, daily to have 5 or more servings of fruits and vegetables.  Education: All About Nutrition: -Group instruction provided by verbal, written material, interactive activities, discussions, models, and posters to present general guidelines for heart healthy nutrition including fat, fiber, MyPlate, the role of sodium in heart healthy nutrition, utilization of the nutrition label, and utilization of this knowledge for meal planning. Follow up email sent as well. Written material given at graduation. Flowsheet Row Pulmonary Rehab from 12/10/2022 in [facility]  Date 12/03/22  Alberteen Sam 2]  Educator [facility]  Instruction Review Code 1- Verbalizes Understanding       Biometrics:  Pre Biometrics - 07/27/22 1431       Pre Biometrics   Height 5\' 7"  (1.702 m)    Weight 175 lb 14.4 oz (79.8 kg)    Waist Circumference 40.5 inches    Hip Circumference 41 inches    Waist to Hip Ratio 0.99 %    BMI (Calculated) 27.54    Single Leg Stand 6.7 seconds             Post Biometrics - 12/08/22 1045        Post  Biometrics   Height 5\' 7"  (1.702 m)    Weight 174 lb 6.4 oz (79.1 kg)    Waist Circumference 44 inches    Hip Circumference 38.5 inches    Waist to Hip Ratio 1.14 %    BMI (Calculated) 27.31    Single Leg Stand 2.5 seconds             Nutrition Therapy Plan and Nutrition Goals:  Nutrition Therapy & Goals - 07/27/22 0938       Nutrition Therapy   Diet Heart healthy, low Na, pulmonary MNT    Drug/Food Interactions Statins/Certain Fruits    Protein (specify units) 85-95g  Fiber 25 grams    Whole Grain Foods 3 servings    Saturated Fats 12 max. grams    Fruits and Vegetables 8  servings/day    Sodium 2 grams      Personal Nutrition Goals   Nutrition Goal ST: review paperwork, try eating small melas/snacks, try ensure complete LT: meet protein and energy needs, eat a variety of whole foods    Comments 67 y.o. F admitted to pulmonary rehab for COPD. PMHx includes HTN, HLD, GERD, former tobacco use, former alcohol abuse, macrocytic anemia. Relevant medications includes lipitor, buspirone, zetia, mobic, remeron, MVI with minerals, omeprazole, oxycodone, K+, breztri. Liboria reports meeting with outpatient RD twice and feels that since her diet is limited as she reports being a "pickey eater" in addition to a reduced appetite. Reviewed what outpatient RD suggested as well as general heart healthy eating and pulmonary MNT. She does not eat until the afternoon/evening at a 3-5pm meal when she gets hungry: pork chop and some macaroni and cheese. She does not like most vegetables, but she enjoys salads (onions and cucumbers and cheese and some egg, pineapple, romaine and iceburg lettuce) and potatoes. She reports not eating a large meal at this time. Matsue reports that she has not tried the reommendations given by outpatient RD as she does not like eating when she is not hunrgy, but is open to trying the premier protein - suggested since she is not eating other meals/snacks at this time she could have ensure complete which would provide the same amount of protein for 350 calories which is closer to a meal replacement. Encouraged that if she begins to eat more meals/snacks that premier protein would be a good option. Encouraged mechanical eating while she is still not feeling hunger cues and suggested snacks with fiber, protein, and fat such as fruit with yogurt or peanut butter. Suggested that her appetite could be lower due to COPD, anemia, and/or prolonged time eating a smaller amount. Lacora also reports her iron is still low and does not know what her MD suggested - encouraged her to reach  out to her MD to see what they recommend.      Intervention Plan   Intervention Prescribe, educate and counsel regarding individualized specific dietary modifications aiming towards targeted core components such as weight, hypertension, lipid management, diabetes, heart failure and other comorbidities.;Nutrition handout(s) given to patient.    Expected Outcomes Short Term Goal: Understand basic principles of dietary content, such as calories, fat, sodium, cholesterol and nutrients.;Short Term Goal: A plan has been developed with personal nutrition goals set during dietitian appointment.;Long Term Goal: Adherence to prescribed nutrition plan.             Nutrition Assessments:  MEDIFICTS Score Key: ?70 Need to make dietary changes  40-70 Heart Healthy Diet ? 40 Therapeutic Level Cholesterol Diet  Flowsheet Row Pulmonary Rehab from 12/10/2022 in [facility] Card[facility] Pulmonary Re[facility]  Picture Your Plate Total Score on Discharge 51      Picture Your Plate Scores: <16 Unhealthy dietary pattern with much room for improvement. 41-50 Dietary pattern unlikely to meet recommendations for good health and room for improvement. 51-60 More healthful dietary pattern, with some room for improvement.  >60 Healthy dietary pattern, although there may be some specific behaviors that could be improved.   Nutrition Goals Re-Evaluation:  Nutrition Goals Re-Evaluation     Row Name 09/01/22 [phone] 10/13/22 [phone] 11/17/22 0930 11/24/22 0926 12/15/22 0931     Goals  Current Weight 172 lb 14.4 oz (78.4 kg) -- -- -- --   Nutrition Goal Marialuisa reports that she has continued to work on her portion control by trying to eat smaller meals and snacks. She has also been trying to eat a varietyof whole foods. We will continue to monitor her dietary habits. ST: eat small/frequent meals/snacks that include fiber, fat, and protein, drink ensure daily LT: meet protein and energy needs, eat a variety of whole foods ST: eat  small/frequent meals/snacks that include fiber, fat, and protein, drink ensure daily LT: meet protein and energy needs, eat a variety of whole foods ST: eat small/frequent meals/snacks that include fiber, fat, and protein, drink ensure daily LT: meet protein and energy needs, eat a variety of whole foods ST: eat small/frequent meals/snacks that include fiber, fat, and protein, drink ensure daily LT: meet protein and energy needs, eat a variety of whole foods   Comment Short: Continue to try eating smaller meals/snacks. Long: Continue to practice nutritional habits discussed with RD. Lyndel reports doing well with her nutrition, she is more mindful of her food choices, managing her portions, and limiting snacking. Mickelle reports her appetite is still low; likely due in part to COPD as welll as limiting calorie intake for a longer period of time. She reports snacking on pretzels and only having dinner as a meal where she has "a meat and a vegetable", sometimes she will have an ensure in the morning - she reports this is due to appetite, but also a desire to lose weight. Reviewed importance of meeting energy and protein needs and that weight loss due to not meeting these needs is more likely muscle loss. Encouraged small/frequent meals/snacks to avoid getting too full at one time - encouraged to include fiber, fat, and protein at these times: apple and pretzels with peanut butter, 1/2 sandwich with leftover chicken and avocado, pita and veggies with hummus, greek yogurt with nuts and berries, etc. Yulanda reports she is drinking Ensure in the morning. more hungry. She does report eating snacks suc has pretzels, which she has added PB. She reports she has a hard time eating a variety of foods due to texture. She does enjoy eating grapes. Her goal is to lose weight but retierated to ensure she is eating enough protein to maintain muscle mass. Disccused with RD who will follow up. Followed up with Charniece and discussed ways  she could try to incorporate a variety of vegetables aside from potatoes - examples include blending vegetables into sauces like zucchini or peppers or spinach and smashing beans into the potatoes she eats. Also discussed the importance of including vegetables in her diet. Shakiera reports having no interest in trying to include any vegetables aside from potatoes into her diet at this time. Encouraged Jenniah to include protein rich foods at meal/snacks and eating small/frequent meals to support lean muscle tissue. Denora is getting ready to graduate in the next couple of weeks. She drinks an Ensure daily to help with her protein intake and snacking. She just met with the RD a couple weeks ago to re-review a couple things. Texture is her biggest barrier. She does not have any questions at this time and will continue to work towards the RD established goals after she graduates.   Expected Outcome -- ST: eat small/frequent meals/snacks that include fiber, fat, and protein, drink ensure daily LT: meet protein and energy needs, eat a variety of whole foods Short: Meet RD when able to review Long: Continue  to eat healthy pulmonary based diet that suits her lifestyle ST: eat small/frequent meals/snacks that include fiber, fat, and protein, drink ensure daily LT: meet protein and energy needs, eat a variety of whole foods Short: Graduate Long: Continue to eat healthy pulmonary based diet            Nutrition Goals Discharge (Final Nutrition Goals Re-Evaluation):  Nutrition Goals Re-Evaluation - 12/15/22 0931       Goals   Nutrition Goal ST: eat small/frequent meals/snacks that include fiber, fat, and protein, drink ensure daily LT: meet protein and energy needs, eat a variety of whole foods    Comment Chesnie is getting ready to graduate in the next couple of weeks. She drinks an Ensure daily to help with her protein intake and snacking. She just met with the RD a couple weeks ago to re-review a couple things.  Texture is her biggest barrier. She does not have any questions at this time and will continue to work towards the RD established goals after she graduates.    Expected Outcome Short: Graduate Long: Continue to eat healthy pulmonary based diet             Psychosocial: Target Goals: Acknowledge presence or absence of significant depression and/or stress, maximize coping skills, provide positive support system. Participant is able to verbalize types and ability to use techniques and skills needed for reducing stress and depression.   Education: Stress, Anxiety, and Depression - Group verbal and visual presentation to define topics covered.  Reviews how body is impacted by stress, anxiety, and depression.  Also discusses healthy ways to reduce stress and to treat/manage anxiety and depression.  Written material given at graduation. Flowsheet Row Pulmonary Rehab from 12/10/2022 in [facility] Card[facility] and Pulmonary Rehab  Date 10/29/22  Educator [facility]  Instruction Review Code 1- [employer] Understanding       Education: Sleep Hygiene -Provides group verbal and written instruction about how sleep can affect your health.  Define sleep hygiene, discuss sleep cycles and impact of sleep habits. Review good sleep hygiene tips.    Initial Review & Psychosocial Screening:  Initial Psych Review & Screening - 07/16/22 0912       Initial Review   Current issues with Current Depression;History of Depression;Current Psychotropic Meds;Current Sleep Concerns;Current Stress Concerns    Source of Stress Concerns Chronic Illness    Comments She has always had some form of depression and takes Buspar for it. Disaya feels  depressed at times but states it does not necessarily stem from something.      Family Dynamics   Good Support System? Yes    Comments She can look to her two sisters for support that live in town.      Barriers   Psychosocial barriers to participate in program The patient should benefit from  training in stress management and relaxation.      Screening Interventions   Interventions To provide support and resources with identified psychosocial needs;Encouraged to exercise;Provide feedback about the scores to participant    Expected Outcomes Short Term goal: Utilizing psychosocial counselor, staff and physician to assist with identification of specific Stressors or current issues interfering with healing process. Setting desired goal for each stressor or current issue identified.;Long Term Goal: Stressors or current issues are controlled or eliminated.;Short Term goal: Identification and review with participant of any Quality of Life or Depression concerns found by scoring the questionnaire.;Long Term goal: The participant improves quality of Life and PHQ9 Scores as seen  by post scores and/or verbalization of changes             Quality of Life Scores:  Quality of Life - 12/10/22 1414       Quality of Life   Select Quality of Life            Scores of 19 and below usually indicate a poorer quality of life in these areas.  A difference of  2-3 points is a clinically meaningful difference.  A difference of 2-3 points in the total score of the Quality of Life Index has been associated with significant improvement in overall quality of life, self-image, physical symptoms, and general health in studies assessing change in quality of life.  PHQ-9: Review Flowsheet  More data exists      12/10/2022 11/23/2022 11/09/2022 08/13/2022 07/27/2022  Depression screen PHQ 2/9  Decreased Interest 0  0 0  0  Down, Depressed, Hopeless 0  0 0  0  PHQ - 2 Score 0  0 0  0  Altered sleeping 0  0 - - 0  Tired, decreased energy 0  0 - - 0  Change in appetite 0  0 - - 0  Feeling bad or failure about yourself  0  0 - - 0  Trouble concentrating 0  0 - - 0  Moving slowly or fidgety/restless 0  0 - - 0  Suicidal thoughts 0  0 - - 0  PHQ-9 Score 0  0 - - 0  Difficult doing work/chores - Not  difficult at all - - Not difficult at all    Details       Information is confidential and restricted. Go to Review Flowsheets to unlock data.   Multiple values from one day are sorted in reverse-chronological order        Interpretation of Total Score  Total Score Depression Severity:  1-4 = Minimal depression, 5-9 = Mild depression, 10-14 = Moderate depression, 15-19 = Moderately severe depression, 20-27 = Severe depression   Psychosocial Evaluation and Intervention:  Psychosocial Evaluation - 07/16/22 0917       Psychosocial Evaluation & Interventions   Interventions Stress management education;Relaxation education;Encouraged to exercise with the program and follow exercise prescription    Comments She has always had some form of depression and takes Buspar for it. Deziya feels depressed at times but states it does not necessarily stem from something.She can look to her two sisters for support that live in town.    Expected Outcomes Short: Start [facility] to help with mood. Long: Maintain a healthy mental state.    Continue Psychosocial Services  Follow up required by staff             Psychosocial Re-Evaluation:  Psychosocial Re-Evaluation     Row Name 09/01/22 [phone] 09/22/22 0956 10/13/22 0957 11/17/22 0937 12/15/22 0936     Psychosocial Re-Evaluation   Current issues with Current Depression;Current Sleep Concerns None Identified None Identified None Identified;Current Psychotropic Meds Current Psychotropic Meds   Comments Vasilia is still taking Buspar for anxiety and depression. She states that her depressive symptoms have improved since starting the program. She states that for stress relief she likes to play game on her phone, cook, and exercise. She states that she does have a good support system at home made up by her husband and two sisters. She reports that she was having some sleep concerns, but her sleep has improved since starting the program. Patient reports no  issues  with their current mental states, sleep, stress, depression or anxiety. Will follow up with patient in a few weeks for any changes. Nishi reports that she has no issues with sleep or anthing that is providing stress; she also denies anxiety and depression. Jene states she is doing well mentally. She currently takes Buspar and Effexor which she states is doing well for her. She continues to sleep well and uses her oxygen at night as prescribed. She declines any other concerns with stress or other mental health issues at this time. Senaya is getting ready to graduate in the next couple of weeks and has enjoyed the program! She enjoyed doing the exercises and talking with the staff. She liked that it  held her accountable. She is staying compliant taking her psychosocial medications and does not feel she needs any changes at this time as she feels well overall. Her sister is trying to get her to join a gym with her after graduation, and in the meantime she will continue to walk outside! Denies other concerns at this time.   Expected Outcomes Short: Continue to attend rehab for stress relief. Long: Maintain positive outlook. Short: Continue to exercise regularly to support mental health and notify staff of any changes. Long: maintain mental health and well being through teaching of rehab or prescribed medications independently. Short: Continue to exercise regularly to support mental health and notify staff of any changes. Long: maintain mental health and well being through teaching of rehab or prescribed medications independently. Short: Continue to exercise for mood boost ans stay compliant with medications Long: Continue to utilize exercise for stress management and maintain positive attitude Short: Graduate Long: Continue to maintain positive attitude   Interventions Encouraged to attend Pulmonary Rehabilitation for the exercise Encouraged to attend Pulmonary Rehabilitation for the exercise Encouraged to  attend Pulmonary Rehabilitation for the exercise Encouraged to attend Pulmonary Rehabilitation for the exercise Encouraged to attend Pulmonary Rehabilitation for the exercise   Continue Psychosocial Services  Follow up required by staff Follow up required by staff Follow up required by staff Follow up required by staff Follow up required by staff            Psychosocial Discharge (Final Psychosocial Re-Evaluation):  Psychosocial Re-Evaluation - 12/15/22 0936       Psychosocial Re-Evaluation   Current issues with Current Psychotropic Meds    Comments Adrieana is getting ready to graduate in the next couple of weeks and has enjoyed the program! She enjoyed doing the exercises and talking with the staff. She liked that it  held her accountable. She is staying compliant taking her psychosocial medications and does not feel she needs any changes at this time as she feels well overall. Her sister is trying to get her to join a gym with her after graduation, and in the meantime she will continue to walk outside! Denies other concerns at this time.    Expected Outcomes Short: Graduate Long: Continue to maintain positive attitude    Interventions Encouraged to attend Pulmonary Rehabilitation for the exercise    Continue Psychosocial Services  Follow up required by staff             Education: Education Goals: Education classes will be provided on a weekly basis, covering required topics. Participant will state understanding/return demonstration of topics presented.  Learning Barriers/Preferences:  Learning Barriers/Preferences - 07/16/22 0911       Learning Barriers/Preferences   Learning Barriers None    Learning Preferences None  General Pulmonary Education Topics:  Infection Prevention: - Provides verbal and written material to individual with discussion of infection control including proper hand washing and proper equipment cleaning during exercise session. Flowsheet  Row Pulmonary Rehab from 12/10/2022 in [facility] Cardiac and Pulmonary Rehab  Date 07/16/22  Educator [facility]  Instruction Review Code 1- Verbalizes Understanding       Falls Prevention: - Provides verbal and written material to individual with discussion of falls prevention and safety. Flowsheet Row Pulmonary Rehab from 12/10/2022 in [facility] Cardiac and Pulmonary Rehab  Date 07/16/22  Educator [facility]  Instruction Review Code 1- Verbalizes Understanding       Chronic Lung Disease Review: - Group verbal instruction with posters, models, PowerPoint presentations and videos,  to review new updates, new respiratory medications, new advancements in procedures and treatments. Providing information on websites and "800" numbers for continued self-education. Includes information about supplement oxygen, available portable oxygen systems, continuous and intermittent flow rates, oxygen safety, concentrators, and Medicare reimbursement for oxygen. Explanation of Pulmonary Drugs, including class, frequency, complications, importance of spacers, rinsing mouth after steroid MDI's, and proper cleaning methods for nebulizers. Review of basic lung anatomy and physiology related to function, structure, and complications of lung disease. Review of risk factors. Discussion about methods for diagnosing sleep apnea and types of masks and machines for OSA. Includes a review of the use of types of environmental controls: home humidity, furnaces, filters, dust mite/pet prevention, HEPA vacuums. Discussion about weather changes, air quality and the benefits of nasal washing. Instruction on Warning signs, infection symptoms, calling MD promptly, preventive modes, and value of vaccinations. Review of effective airway clearance, coughing and/or vibration techniques. Emphasizing that all should Create an Action Plan. Written material given at graduation. Flowsheet Row Pulmonary Rehab from 12/10/2022 in [facility] Cardiac and Pulmonary Rehab   Education need identified 07/27/22  Date 10/22/22  Educator [facility]  Instruction Review Code 1- Verbalizes Understanding       AED/CPR: - Group verbal and written instruction with the use of models to demonstrate the basic use of the AED with the basic ABC's of resuscitation.    Anatomy and Cardiac Procedures: - Group verbal and visual presentation and models provide information about basic cardiac anatomy and function. Reviews the testing methods done to diagnose heart disease and the outcomes of the test results. Describes the treatment choices: Medical Management, Angioplasty, or Coronary Bypass Surgery for treating various heart conditions including Myocardial Infarction, Angina, Valve Disease, and Cardiac Arrhythmias.  Written material given at graduation. Flowsheet Row Pulmonary Rehab from 12/10/2022 in [facility] Cardiac and Pulmonary Rehab  Date 12/10/22  Educator SB  Instruction Review Code 1- Verbalizes Understanding       Medication Safety: - Group verbal and visual instruction to review commonly prescribed medications for heart and lung disease. Reviews the medication, class of the drug, and side effects. Includes the steps to properly store meds and maintain the prescription regimen.  Written material given at graduation. Flowsheet Row Pulmonary Rehab from 12/10/2022 in [facility] Cardiac and Pulmonary Rehab  Date 10/08/22  Educator [facility]  Instruction Review Code 1- Verbalizes Understanding       Other: -Provides group and verbal instruction on various topics (see comments) Flowsheet Row Pulmonary Rehab from 12/10/2022 in [facility] Cardiac and Pulmonary Rehab  Date 10/01/22  Educator SB  Instruction Review Code 1- Verbalizes Understanding       Knowledge Questionnaire Score:  Knowledge Questionnaire Score - 12/10/22 1412       Knowledge Questionnaire Score  Pre Score 14/18    Post Score 17/18              Core Components/Risk Factors/Patient Goals at Admission:  Personal  Goals and Risk Factors at Admission - 07/27/22 1433       Core Components/Risk Factors/Patient Goals on Admission    Weight Management Yes;Weight Loss    Intervention Weight Management: Develop a combined nutrition and exercise program designed to reach desired caloric intake, while maintaining appropriate intake of nutrient and fiber, sodium and fats, and appropriate energy expenditure required for the weight goal.;Weight Management: Provide education and appropriate resources to help participant work on and attain dietary goals.;Weight Management/Obesity: Establish reasonable short term and long term weight goals.    Admit Weight 175 lb 14.4 oz (79.8 kg)    Goal Weight: Short Term 170 lb (77.1 kg)    Goal Weight: Long Term 165 lb (74.8 kg)    Expected Outcomes Long Term: Adherence to nutrition and physical activity/exercise program aimed toward attainment of established weight goal;Short Term: Continue to assess and modify interventions until short term weight is achieved;Weight Loss: Understanding of general recommendations for a balanced deficit meal plan, which promotes 1-2 lb weight loss per week and includes a negative energy balance of [phone] kcal/d;Understanding of distribution of calorie intake throughout the day with the consumption of 4-5 meals/snacks;Understanding recommendations for meals to include 15-35% energy as protein, 25-35% energy from fat, 35-60% energy from carbohydrates, less than 200mg  of dietary cholesterol, 20-35 gm of total fiber daily    Improve shortness of breath with ADL's Yes    Intervention Provide education, individualized exercise plan and daily activity instruction to help decrease symptoms of SOB with activities of daily living.    Expected Outcomes Short Term: Improve cardiorespiratory fitness to achieve a reduction of symptoms when performing ADLs;Long Term: Be able to perform more ADLs without symptoms or delay the onset of symptoms    Increase knowledge of  respiratory medications and ability to use respiratory devices properly  Yes    Intervention Provide education and demonstration as needed of appropriate use of medications, inhalers, and oxygen therapy.    Expected Outcomes Short Term: Achieves understanding of medications use. Understands that oxygen is a medication prescribed by physician. Demonstrates appropriate use of inhaler and oxygen therapy.;Long Term: Maintain appropriate use of medications, inhalers, and oxygen therapy.    Hypertension Yes    Intervention Provide education on lifestyle modifcations including regular physical activity/exercise, weight management, moderate sodium restriction and increased consumption of fresh fruit, vegetables, and low fat dairy, alcohol moderation, and smoking cessation.;Monitor prescription use compliance.    Expected Outcomes Short Term: Continued assessment and intervention until BP is < 140/70mm HG in hypertensive participants. < 130/46mm HG in hypertensive participants with diabetes, heart failure or chronic kidney disease.;Long Term: Maintenance of blood pressure at goal levels.    Lipids Yes    Intervention Provide education and support for participant on nutrition & aerobic/resistive exercise along with prescribed medications to achieve LDL 70mg , HDL >40mg .    Expected Outcomes Short Term: Participant states understanding of desired cholesterol values and is compliant with medications prescribed. Participant is following exercise prescription and nutrition guidelines.;Long Term: Cholesterol controlled with medications as prescribed, with individualized exercise RX and with personalized nutrition plan. Value goals: LDL < 70mg , HDL > 40 mg.             Education:Diabetes - Individual verbal and written instruction to review signs/symptoms of diabetes, desired ranges of glucose  level fasting, after meals and with exercise. Acknowledge that pre and post exercise glucose checks will be done for 3  sessions at entry of program.   Know Your Numbers and Heart Failure: - Group verbal and visual instruction to discuss disease risk factors for cardiac and pulmonary disease and treatment options.  Reviews associated critical values for Overweight/Obesity, Hypertension, Cholesterol, and Diabetes.  Discusses basics of heart failure: signs/symptoms and treatments.  Introduces Heart Failure Zone chart for action plan for heart failure.  Written material given at graduation. Flowsheet Row Pulmonary Rehab from 12/10/2022 in [facility] Cardiac and Pulmonary Rehab  Date 08/06/22  Educator SB  Instruction Review Code 1- Verbalizes Understanding       Core Components/Risk Factors/Patient Goals Review:   Goals and Risk Factor Review     Row Name 09/01/22 [phone] 09/22/22 0955 10/13/22 0948 11/17/22 0934 12/15/22 0932     Core Components/Risk Factors/Patient Goals Review   Personal Goals Review Weight Management/Obesity;Hypertension Improve shortness of breath with ADL's Improve shortness of breath with ADL's;Hypertension Improve shortness of breath with ADL's;Hypertension Improve shortness of breath with ADL's;Hypertension;Weight Management/Obesity   Review Ahlia reports that she has lost some weight as she is down from 175 lb to 172.9 lb. She is still working toward her long term weight goal of 165 lb. She has been checking her BP at home and reports that they have been around 130/78. She was encouraged to continue to check her BP at home. Spoke to patient about their shortness of breath and what they can do to improve. Patient has been informed of breathing techniques when starting the program. Patient is informed to tell staff if they have had any med changes and that certain meds they are taking or not taking can be causing shortness of breath. Jullisa reports that her shortness of breath has been improving since starting at rehab. Katharina reports not checking her BP at home unless she has issues, but has a cuff at  home. Encouraged to check BP at home at least 2x/week to get into the habit of checking at home. Today in rehab her resting BP was 132/80. Gizel feels liker her breathing is not as wheezy as she used to be, and feels like her endurance has improved. She is  limited with hips and back pain when walking but able to manage OK. She is supposed to get an epidural soon which she hopes will allieviate from pain. She continues PLB but her allergies are effecting her significantly right now. She is taking medications OK. BP at home is being checked routinely and ranges around 130s/80s. Rehab they are around the same. Renise has felt a significant difference in improving SOB, she doesn't feel as short winded. She continues to use PLB when needed. She continues to check her  BP at home which is running 130-140s/70-80s. Her doctor recently changed her BP meds and she is noticing it is running a little higher than normal. Encouraged her to call her MD to let her know and to take a daily log to present to her doctor. Rehab pressures have been good. Her weight has been steady, though she is limited with her diet due to barriers with texture. Jackline is getting ready to graduate in the next couple of weeks and denies other concerns at this time.   Expected Outcomes Short: Continue to check BP at home. Long: Continue to work toward long term weight goal. Short: Attend [facility] regularly to improve shortness of breath with ADL's. Long:  maintain independence with ADL's Short: Attend [facility] regularly to improve shortness of breath with ADL's. Long: maintain independence with ADL's Short: Continue to monitor BP and get through allergy season Long: Continue to manage lifestyle risk factors Short: Graduate Long: Continue to monitor life-style risk factors, stay compliant            Core Components/Risk Factors/Patient Goals at Discharge (Final Review):   Goals and Risk Factor Review - 12/15/22 0932       Core Components/Risk  Factors/Patient Goals Review   Personal Goals Review Improve shortness of breath with ADL's;Hypertension;Weight Management/Obesity    Review Apryl has felt a significant difference in improving SOB, she doesn't feel as short winded. She continues to use PLB when needed. She continues to check her  BP at home which is running 130-140s/70-80s. Her doctor recently changed her BP meds and she is noticing it is running a little higher than normal. Encouraged her to call her MD to let her know and to take a daily log to present to her doctor. Rehab pressures have been good. Her weight has been steady, though she is limited with her diet due to barriers with texture. Scottlynn is getting ready to graduate in the next couple of weeks and denies other concerns at this time.    Expected Outcomes Short: Graduate Long: Continue to monitor life-style risk factors, stay compliant             ITP Comments:  ITP Comments     Row Name 07/16/22 0909 07/27/22 1425 08/04/22 0953 08/12/22 0956 09/09/22 0904   ITP Comments Virtual Visit completed. Patient informed on EP and RD appointment and 6 Minute walk test. Patient also informed of patient health questionnaires on My Chart. Patient Verbalizes understanding. Visit diagnosis can be found in [facility] 07/07/2022. Completed and gym orientation. Initial ITP created and sent for review to Dr. Jinny Sanders, Medical Director. First full day of exercise!  Patient was oriented to gym and equipment including functions, settings, policies, and procedures.  Patient's individual exercise prescription and treatment plan were reviewed.  All starting workloads were established based on the results of the 6 minute walk test done at initial orientation visit.  The plan for exercise progression was also introduced and progression will be customized based on patient's performance and goals. 30 Day review completed. Medical Director ITP review done, changes made as directed, and signed  approval by Medical Director.    new to program 30 Day review completed. Medical Director ITP review done, changes made as directed, and signed approval by Medical Director.    Row Name 10/07/22 1212 11/04/22 0802 12/02/22 0819 12/29/22 1038     ITP Comments 30 day review completed. ITP sent to Dr. Jinny Sanders, Medical Director of  Pulmonary Rehab. Continue with ITP unless changes are made by physician. 30 Day review completed. Medical Director ITP review done, changes made as directed, and signed approval by Medical Director. 30 day review completed. ITP sent to Dr. Jinny Sanders, Medical Director of  Pulmonary Rehab. Continue with ITP unless changes are made by physician. Breyonna graduated today from  rehab with 36 sessions completed.  Details of the patient's exercise prescription and what She needs to do in order to continue the prescription and progress were discussed with patient.  Patient was given a copy of prescription and goals.  Patient verbalized understanding.  [place] plans to continue to exercise by exercising at home.Marland Kitchen  Comments: Discharge ITP

## 2022-12-29 NOTE — Progress Notes (Signed)
Daily Session Note  Patient Details  Name: Gabrielle Pineda MRN: [id number] Date of Birth: 1955-12-28 Referring Provider:   Flowsheet Row Pulmonary Rehab from 07/27/2022 in [facility]  Referring Provider Sarina Ser MD       Encounter Date: 12/29/2022  Check In:  Session Check In - 12/29/22 1037       Check-In   Supervising physician immediately available to respond to emergencies See telemetry face sheet for immediately available ER MD    Location [facility]-Cardiac & Pulmonary Rehab    Staff Present Cora Collum, RN, BSN, CCRP;Jessica Monticello, MA, RCEP, CCRP, Zackery Barefoot, MS, ACSM CEP, Exercise Physiologist    Virtual Visit No    Medication changes reported     No    Fall or balance concerns reported    No    Warm-up and Cool-down Performed on first and last piece of equipment    Resistance Training Performed Yes    VAD Patient? No    PAD/SET Patient? No      Pain Assessment   Currently in Pain? No/denies                Social History   Tobacco Use  Smoking Status Former   Packs/day: 2.00   Years: 45.00   Additional pack years: 0.00   Total pack years: 90.00   Types: Cigarettes   Quit date: 10/28/2020   Years since quitting: 2.1  Smokeless Tobacco Never  Tobacco Comments   Ive been quit for about a year now.    Goals Met:  Proper associated with RPD/PD & O2 Sat Independence with exercise equipment Exercise tolerated well Personal goals reviewed No report of concerns or symptoms today  Goals Unmet:  Not Applicable  Comments:  Gabrielle Pineda graduated today from  rehab with 36 sessions completed.  Details of the patient's exercise prescription and what She needs to do in order to continue the prescription and progress were discussed with patient.  Patient was given a copy of prescription and goals.  Patient verbalized understanding.  Gabrielle Pineda plans to continue to exercise by exercising at home..    Dr. Bethann Punches is Medical  Director for [facility] Card[facility] Rehabilitation.  Dr. Vida Rigger is Medical Director for [facility]monary [facility].

## 2022-12-30 ENCOUNTER — Encounter: Payer: Self-pay | Admitting: *Deleted

## 2022-12-30 DIAGNOSIS — J449 Chronic obstructive pulmonary disease, unspecified: Secondary | ICD-10-CM

## 2022-12-30 NOTE — Progress Notes (Signed)
Pulmonary Individual Treatment Plan  Patient Details  Name: PASHENCE SEBOLD MRN: [id number] Date of Birth: February 13, 1956 Referring Provider:   Flowsheet Row Pulmonary Rehab from 07/27/2022 in [facility] Cardiac and Pulmonary Rehab  Referring Provider Sarina Ser MD       Initial Encounter Date:  Flowsheet Row Pulmonary Rehab from 07/27/2022 in [facility] Cardiac and Pulmonary Rehab  Date 07/27/22       Visit Diagnosis: Chronic obstructive pulmonary disease, unspecified COPD type (HCC)  Patient's Home Medications on Admission:  Current Outpatient Medications:    acyclovir ointment (ZOVIRAX) 5 %, :Topical Every 3 Hours as needed for lesion, Disp: 30 g, Rfl: 1   albuterol (PROVENTIL) (2.5 MG/3ML) 0.083% nebulizer solution, Take 3 mLs (2.5 mg total) by nebulization every 4 (four) hours as needed for wheezing or shortness of breath., Disp: 225 mL, Rfl: 2   albuterol (VENTOLIN HFA) 108 (90 Base) MCG/ACT inhaler, INHALE TWO PUFFS BY MOUTH INTO LUNGS every SIX hours AS NEEDED FOR WHEEZING AND/OR SHORTNESS OF BREATH, Disp: 8.5 g, Rfl: 2   ARIPiprazole (ABILIFY) 2 MG tablet, Take 1 tablet (2 mg total) by mouth daily. Stop Abilify 5 mg - dose change, Disp: 30 tablet, Rfl: 1   atorvastatin (LIPITOR) 40 MG tablet, TAKE ONE TABLET BY MOUTH EVERY MORNING, Disp: 90 tablet, Rfl: 0   Budeson-Glycopyrrol-Formoterol (BREZTRI AEROSPHERE) 160-9-4.8 MCG/ACT AERO, Inhale 2 puffs into the lungs in the morning and at bedtime., Disp: 10.7 g, Rfl: 11   carvedilol (COREG) 12.5 MG tablet, TAKE ONE TABLET BY MOUTH EVERY MORNING and TAKE ONE TABLET BY MOUTH EVERYDAY AT BEDTIME, Disp: 180 tablet, Rfl: 0   ezetimibe (ZETIA) 10 MG tablet, TAKE ONE TABLET BY MOUTH EVERY MORNING, Disp: 90 tablet, Rfl: 0   fexofenadine (ALLEGRA) 180 MG tablet, Take 180 mg by mouth daily., Disp: , Rfl:    furosemide (LASIX) 20 MG tablet, Take 1 tablet (20 mg total) by mouth daily., Disp: 90 tablet, Rfl: 0   gabapentin (NEURONTIN) 400 MG capsule,  Take 1 capsule (400 mg total) by mouth 2 (two) times daily., Disp: 60 capsule, Rfl: 5   ipratropium (ATROVENT) 0.03 % nasal spray, Place 2 sprays into both nostrils every 12 (twelve) hours., Disp: 30 mL, Rfl: 12   leflunomide (ARAVA) 20 MG tablet, Take 20 mg by mouth at bedtime., Disp: , Rfl:    LINZESS 145 MCG CAPS capsule, Take 145 mcg by mouth daily before breakfast., Disp: , Rfl:    losartan (COZAAR) 25 MG tablet, Take 1 tablet (25 mg total) by mouth daily., Disp: 90 tablet, Rfl: 1   meloxicam (MOBIC) 15 MG tablet, Take 15 mg by mouth at bedtime., Disp: , Rfl:    methylPREDNISolone (MEDROL DOSEPAK) 4 MG TBPK tablet, Take as directed in the package., Disp: 21 tablet, Rfl: 0   mirtazapine (REMERON) 15 MG tablet, TAKE ONE TABLET BY MOUTH EVERYDAY AT BEDTIME, Disp: 90 tablet, Rfl: 0   montelukast (SINGULAIR) 10 MG tablet, TAKE ONE TABLET BY MOUTH EVERYDAY AT BEDTIME, Disp: 30 tablet, Rfl: 11   Multiple Vitamin (MULTIVITAMIN WITH MINERALS) TABS tablet, Take 1 tablet by mouth daily., Disp: , Rfl:    Nebulizer MISC, Compressor nebulizer to use with nebulized medications as instructed, Disp: 1 each, Rfl: 0   omeprazole (PRILOSEC) 40 MG capsule, TAKE ONE CAPSULE BY MOUTH EVERY MORNING and TAKE ONE CAPSULE BY MOUTH EVERYDAY AT BEDTIME, Disp: 180 capsule, Rfl: 1   oxyCODONE-acetaminophen (PERCOCET) 5-325 MG tablet, Take 1 tablet by mouth every 8 (eight)  hours as needed for severe pain. Must last 30 days., Disp: 90 tablet, Rfl: 0   Oxymetazoline HCl (MUCINEX NASAL SPRAY MOISTURE NA), Place into the nose., Disp: , Rfl:    potassium chloride SA (KLOR-CON M) 20 MEQ tablet, TAKE ONE TABLET BY MOUTH EVERY MORNING, Disp: 90 tablet, Rfl: 1   predniSONE (DELTASONE) 20 MG tablet, Take 20 mg by mouth 2 (two) times daily., Disp: , Rfl:    SPIKEVAX syringe, , Disp: , Rfl:    topiramate (TOPAMAX) 25 MG tablet, Take 1 tablet (25 mg total) by mouth 2 (two) times daily., Disp: 60 tablet, Rfl: 11   valACYclovir (VALTREX)  1000 MG tablet, TAKE TWO TABLETS BY MOUTH TWICE DAILY AS NEEDED FOR 5 DAYS FOR FEVER blisters, Disp: 30 tablet, Rfl: 1   venlafaxine XR (EFFEXOR-XR) 75 MG 24 hr capsule, TAKE THREE CAPSULES BY MOUTH EVERY MORNING, Disp: 270 capsule, Rfl: 0   XIIDRA 5 % SOLN, Place 1 drop into both eyes daily., Disp: , Rfl:   Past Medical History: Past Medical History:  Diagnosis Date   Alcohol abuse    Allergy 1983   Anemia    Anxiety    Arthritis    Cataract    Cervicalgia    Cirrhosis (HCC) 1994   COPD (chronic obstructive pulmonary disease) (HCC)    Depression    Difficult intubation    has plates and screws in neck   Dyspnea    GERD (gastroesophageal reflux disease)    Headache    Heart murmur    on heard when pt is lying   Hypertension    Neuromuscular disorder (HCC)    Neuropathy   Other and unspecified hyperlipidemia    Oxygen deficiency    Status post insertion of spinal cord stimulator    Tachycardia    d/t questionable anxiety happens every 5- 10 years, sees [facility]    Tobacco Use: Social History   Tobacco Use  Smoking Status Former   Packs/day: 2.00   Years: 45.00   Additional pack years: 0.00   Total pack years: 90.00   Types: Cigarettes   Quit date: 10/28/2020   Years since quitting: 2.1  Smokeless Tobacco Never  Tobacco Comments   Ive been quit for about a year now.    Labs: Review Flowsheet  More data exists      Latest Ref Rng & Units 04/19/2020 03/17/2021 06/26/2021 11/17/2021 11/23/2022  Labs for ITP Cardiac and Pulmonary Rehab  Cholestrol 100 - 199 mg/dL 841  - 324  401  027   LDL (calc) 0 - 99 mg/dL 98  - 50  79  78   HDL-C >39 mg/dL 56  - 39  40  59   Trlycerides 0 - 149 mg/dL 253  - 664  403  474   Hemoglobin A1c 4.8 - 5.6 % - - 5.3  - 5.3   TCO2 22 - 32 mmol/L - 21  - - -     Pulmonary Assessment Scores:  Pulmonary Assessment Scores     Row Name 12/10/22 1412         ADL UCSD   ADL Phase Exit     SOB Score total 27     Rest 0      Walk 0     Stairs 1     Bath 0     Dress 0     Shop 1       CAT Score   CAT Score  5              UCSD: Self-administered rating of dyspnea associated with activities of daily living (ADLs) 6-point scale (0 = "not at all" to 5 = "maximal or unable to do because of breathlessness")  Scoring Scores range from 0 to 120.  Minimally important difference is 5 units  CAT: CAT can identify the health impairment of COPD patients and is better correlated with disease progression.  CAT has a scoring range of zero to 40. The CAT score is classified into four groups of low (less than 10), medium (10 - 20), high (21-30) and very high (31-40) based on the impact level of disease on health status. A CAT score over 10 suggests significant symptoms.  A worsening CAT score could be explained by an exacerbation, poor medication adherence, poor inhaler technique, or progression of COPD or comorbid conditions.  CAT MCID is 2 points  mMRC: mMRC (Modified Medical Research Council) Dyspnea Scale is used to assess the degree of baseline functional disability in patients of respiratory disease due to dyspnea. No minimal important difference is established. A decrease in score of 1 point or greater is considered a positive change.   Pulmonary Function Assessment:   Exercise Target Goals: Exercise Program Goal: Individual exercise prescription set using results from initial 6 min walk test and THRR while considering  patient's activity barriers and safety.   Exercise Prescription Goal: Initial exercise prescription builds to 30-45 minutes a day of aerobic activity, 2-3 days per week.  Home exercise guidelines will be given to patient during program as part of exercise prescription that the participant will acknowledge.  Education: Aerobic Exercise: - Group verbal and visual presentation on the components of exercise prescription. Introduces F.I.T.T principle from ACSM for exercise prescriptions.  Reviews  F.I.T.T. principles of aerobic exercise including progression. Written material given at graduation. Flowsheet Row Pulmonary Rehab from 12/10/2022 in [facility] Cardiac and Pulmonary Rehab  Date 11/12/22  Educator [facility]  Instruction Review Code 1- Verbalizes Understanding       Education: Resistance Exercise: - Group verbal and visual presentation on the components of exercise prescription. Introduces F.I.T.T principle from ACSM for exercise prescriptions  Reviews F.I.T.T. principles of resistance exercise including progression. Written material given at graduation. Flowsheet Row Pulmonary Rehab from 12/10/2022 in [facility] Cardiac and Pulmonary Rehab  Date 09/10/22  Educator [facility]  Instruction Review Code 1- Verbalizes Understanding        Education: Exercise & Equipment Safety: - Individual verbal instruction and demonstration of equipment use and safety with use of the equipment. Flowsheet Row Pulmonary Rehab from 12/10/2022 in [facility] Cardiac and Pulmonary Rehab  Date 07/16/22  Educator [facility]  Instruction Review Code 1- Verbalizes Understanding       Education: Exercise Physiology & General Exercise Guidelines: - Group verbal and written instruction with models to review the exercise physiology of the cardiovascular system and associated critical values. Provides general exercise guidelines with specific guidelines to those with heart or lung disease.  Flowsheet Row Pulmonary Rehab from 12/10/2022 in [facility] Cardiac and Pulmonary Rehab  Date 11/05/22  Educator [facility]  Instruction Review Code 1- Verbalizes Understanding       Education: Flexibility, Balance, Mind/Body Relaxation: - Group verbal and visual presentation with interactive activity on the components of exercise prescription. Introduces F.I.T.T principle from ACSM for exercise prescriptions. Reviews F.I.T.T. principles of flexibility and balance exercise training including progression. Also discusses the mind body connection.  Reviews various  relaxation techniques to help  reduce and manage stress (i.e. Deep breathing, progressive muscle relaxation, and visualization). Balance handout provided to take home. Written material given at graduation. Flowsheet Row Pulmonary Rehab from 12/10/2022 in [facility] Cardiac and Pulmonary Rehab  Date 09/17/22  Educator [facility]  Instruction Review Code 1- Verbalizes Understanding       Activity Barriers & Risk Stratification:   6 Minute Walk:  6 Minute Walk     Row Name 12/08/22 1032         6 Minute Walk   Phase Discharge     Distance 1215 feet     Distance % Change 17.4 %     Distance Feet Change 180 ft     Walk Time 6 minutes     # of Rest Breaks 0     MPH 2.3     METS 3.54     RPE 13     Perceived Dyspnea  1     VO2 Peak 12.41     Symptoms Yes (comment)     Comments SOB     Resting HR 75 bpm     Resting BP 146/70     Resting Oxygen Saturation  97 %     Exercise Oxygen Saturation  during 6 min walk 95 %     Max Ex. HR 110 bpm     Max Ex. BP 182/76     2 Minute Post BP 140/74       Interval HR   1 Minute HR 95     2 Minute HR 105     3 Minute HR 108     4 Minute HR 109     5 Minute HR 108     6 Minute HR 110     2 Minute Post HR 77     Interval Heart Rate? Yes       Interval Oxygen   Interval Oxygen? Yes     Baseline Oxygen Saturation % 97 %     1 Minute Oxygen Saturation % 96 %     1 Minute Liters of Oxygen 0 L  Room Air     2 Minute Oxygen Saturation % 95 %     2 Minute Liters of Oxygen 0 L     3 Minute Oxygen Saturation % 95 %     3 Minute Liters of Oxygen 0 L     4 Minute Oxygen Saturation % 95 %     4 Minute Liters of Oxygen 0 L     5 Minute Oxygen Saturation % 95 %     5 Minute Liters of Oxygen 0 L     6 Minute Oxygen Saturation % 95 %     6 Minute Liters of Oxygen 0 L     2 Minute Post Oxygen Saturation % 99 %     2 Minute Post Liters of Oxygen 0 L             Oxygen Initial Assessment:  Oxygen Initial Assessment - 12/15/22 0958       Home Oxygen    Home Oxygen Device Home Concentrator    Sleep Oxygen Prescription Continuous    Liters per minute 2    Home Exercise Oxygen Prescription None    Home Resting Oxygen Prescription None    Compliance with Home Oxygen Use Yes      Initial 6 min Walk   Oxygen Used None      Program Oxygen Prescription   Program Oxygen  Prescription None    Liters per minute 2    Comments sleep only      Intervention   Short Term Goals To learn and demonstrate proper pursed lip breathing techniques or other breathing techniques. ;To learn and understand importance of maintaining oxygen saturations>88%;To learn and demonstrate proper use of respiratory medications    Long  Term Goals Exhibits proper breathing techniques, such as pursed lip breathing or other method taught during program session;Maintenance of O2 saturations>88%;Compliance with respiratory medication             Oxygen Re-Evaluation:  Oxygen Re-Evaluation     Row Name 11/17/22 1610 12/15/22 0958           Goals/Expected Outcomes   Comments Doreen believes she is not wheezing as much and feels her endurance has improved. She is not always using her pulse ox but reviewed the importance of checking it and ensuring her oxygen above 88%. She still uses PLB when needed. Ismerai still continues to see improvement with her breathing. She has been doing well walking on the track here which she states she wouldn't have been able to do before she started the program. She has been staying complaint with using her pulse ox to check her vitals. She is continuing to take all of her medications and stays compliant with her sleep prescription. She continues PLB. She is getting ready to graduate in the next couple of weeks!      Goals/Expected Outcomes Short: Start using pulse ox at home, monitor HR and O2 Long: Become proficient at PLB long-term Short: Graduate Long: Continue long-term monitoring of HR, O2, and PLB. Stay compliant with medication adherence                Oxygen Discharge (Final Oxygen Re-Evaluation):  Oxygen Re-Evaluation - 12/15/22 0958       Goals/Expected Outcomes   Comments Akeria still continues to see improvement with her breathing. She has been doing well walking on the track here which she states she wouldn't have been able to do before she started the program. She has been staying complaint with using her pulse ox to check her vitals. She is continuing to take all of her medications and stays compliant with her sleep prescription. She continues PLB. She is getting ready to graduate in the next couple of weeks!    Goals/Expected Outcomes Short: Graduate Long: Continue long-term monitoring of HR, O2, and PLB. Stay compliant with medication adherence             Initial Exercise Prescription:   Perform Capillary Blood Glucose checks as needed.  Exercise Prescription Changes:   Exercise Prescription Changes     Row Name 11/18/22 1500 12/01/22 1500 12/15/22 1500         Response to Exercise   Blood Pressure (Admit) 118/62 124/82 108/60     Blood Pressure (Exit) 122/70 110/68 96/58     Heart Rate (Admit) 71 bpm 74 bpm 79 bpm     Heart Rate (Exercise) 111 bpm 99 bpm 114 bpm     Heart Rate (Exit) 82 bpm 85 bpm 89 bpm     Oxygen Saturation (Admit) 98 % 96 % 90 %     Oxygen Saturation (Exercise) 93 % 91 % 88 %     Oxygen Saturation (Exit) 96 % 92 % 96 %     Rating of Perceived Exertion (Exercise) 15 14 16      Perceived Dyspnea (Exercise) 1 1 1  Symptoms SOB SOB SOB     Duration Continue with 30 min of aerobic exercise without signs/symptoms of physical distress. Continue with 30 min of aerobic exercise without signs/symptoms of physical distress. Continue with 30 min of aerobic exercise without signs/symptoms of physical distress.     Intensity THRR unchanged THRR unchanged THRR unchanged       Progression   Progression Continue to progress workloads to maintain intensity without signs/symptoms of  physical distress. Continue to progress workloads to maintain intensity without signs/symptoms of physical distress. Continue to progress workloads to maintain intensity without signs/symptoms of physical distress.     Average METs 2.99 3.04 3.04       Resistance Training   Training Prescription Yes Yes Yes     Weight 3 lb 3 lb 3 lb     Reps 10-15 10-15 10-15       Interval Training   Interval Training No No No       Recumbant Bike   Level 2 -- 2     Watts 31 -- 31     Minutes 15 -- 15     METs 2.8 -- 3.23       NuStep   Level 2 2 2      Minutes 15 15 15      METs 2.6 2.6 2.7       Recumbant Elliptical   Level -- -- 1     Minutes -- -- 15     METs -- -- 1.4       REL-XR   Level 2 2 2      Minutes 15 15 15      METs 3.1 4 4.2       Track   Laps 30 25 30      Minutes 15 15 15      METs 2.63 2.36 2.63       Home Exercise Plan   Plans to continue exercise at Home (comment)  walking Home (comment)  walking Home (comment)  walking     Frequency Add 2 additional days to program exercise sessions. Add 2 additional days to program exercise sessions. Add 2 additional days to program exercise sessions.     Initial Home Exercises Provided 09/22/22 09/22/22 09/22/22       Oxygen   Maintain Oxygen Saturation 88% or higher 88% or higher 88% or higher              Exercise Comments:   Exercise Comments     Row Name 12/29/22 1038           Exercise Comments Basma graduated today from  rehab with 36 sessions completed.  Details of the patient's exercise prescription and what She needs to do in order to continue the prescription and progress were discussed with patient.  Patient was given a copy of prescription and goals.  Patient verbalized understanding.  Javaya plans to continue to exercise by exercising at home..                Exercise Goals and Review:   Exercise Goals Re-Evaluation :  Exercise Goals Re-Evaluation     Row Name 11/17/22 0927 11/18/22 1527 12/01/22  1547 12/15/22 0921 12/15/22 1518     Exercise Goal Re-Evaluation   Exercise Goals Review Increase Physical Activity;Increase Strength and Stamina;Understanding of Exercise Prescription Increase Physical Activity;Increase Strength and Stamina;Understanding of Exercise Prescription Increase Physical Activity;Increase Strength and Stamina;Understanding of Exercise Prescription Increase Physical Activity;Increase Strength and Stamina;Understanding of Exercise Prescription Increase Physical  Activity;Increase Strength and Stamina;Understanding of Exercise Prescription   Comments Kennetta continues to  walk at home for about  20 minutes, we encouraged her to increase her duration to 30 minutes for each session, slowly and progressively. She walks about 5 days/ week, She does not normally check her HR or O2 but encouraged her to bring her pulse ox with her to monitor her vitals. Bellamy is doing well in rehab. She has increased her overall average MET level back up to 2.99 METs. She also has consistently walked 30 laps on the track, and has kept her workloads consistent on all of her seated machines. She also has continued to use 3 lb hand weights for resistance training. We will continue to monitor her progress in the program. Rayya continues to do well in rehab. She completed 25-30 laps on average on the track. She could benefit from increasing her workload on the T4 Nustep beyond level 2. She continues to maintain oxygen saturation above 88%. She did reach over 4 METS on the XR! She is due for her post and we hope to see improvement. Will continue to monitor. Kalyani improved on her post by 17.4%! She will be graduating in the next couple of weeks. She plans on walking at home as her exercise post grad. We reviewed weather restrictions again. Her sister is trying to get her to join a gym, and we talked about how beneficial it may be that she can access seated machines, that would help with her back and hip pain.  She will continue to monitor her HR and O2 using her pulse ox at home. Kathyjo is doing well in rehab and is close to graduating. She recently completed her post and improved by 17.4%! She also began using the recumbent elliptical and did well at level 1. She has continued to work at level 2 on the XR, T4 nustep, and recumbent bike as well. We will continue to monitor her progress until she graduates from the program.   Expected Outcomes Short: Increase duration of exercise to minimum 30 minutes, work up to it Long: Continue to exercise independently at home Short: Progressively increase workloads, push for more laps on track. Long: Continue to improve strength and stamina. Short: Improve on post Long: Continue to increase overall MET level and stamina Short: Graduate Long: Continue to exercise independently at home Short: Graduate. Long: Continue to exercise independently at home.            Discharge Exercise Prescription (Final Exercise Prescription Changes):  Exercise Prescription Changes - 12/15/22 1500       Response to Exercise   Blood Pressure (Admit) 108/60    Blood Pressure (Exit) 96/58    Heart Rate (Admit) 79 bpm    Heart Rate (Exercise) 114 bpm    Heart Rate (Exit) 89 bpm    Oxygen Saturation (Admit) 90 %    Oxygen Saturation (Exercise) 88 %    Oxygen Saturation (Exit) 96 %    Rating of Perceived Exertion (Exercise) 16    Perceived Dyspnea (Exercise) 1    Symptoms SOB    Duration Continue with 30 min of aerobic exercise without signs/symptoms of physical distress.    Intensity THRR unchanged      Progression   Progression Continue to progress workloads to maintain intensity without signs/symptoms of physical distress.    Average METs 3.04      Resistance Training   Training Prescription Yes    Weight 3  lb    Reps 10-15      Interval Training   Interval Training No      Recumbant Bike   Level 2    Watts 31    Minutes 15    METs 3.23      NuStep    Level 2    Minutes 15    METs 2.7      Recumbant Elliptical   Level 1    Minutes 15    METs 1.4      REL-XR   Level 2    Minutes 15    METs 4.2      Track   Laps 30    Minutes 15    METs 2.63      Home Exercise Plan   Plans to continue exercise at Home (comment)   walking   Frequency Add 2 additional days to program exercise sessions.    Initial Home Exercises Provided 09/22/22      Oxygen   Maintain Oxygen Saturation 88% or higher             Nutrition:  Target Goals: Understanding of nutrition guidelines, daily intake of sodium 1500mg , cholesterol 200mg , calories 30% from fat and 7% or less from saturated fats, daily to have 5 or more servings of fruits and vegetables.  Education: All About Nutrition: -Group instruction provided by verbal, written material, interactive activities, discussions, models, and posters to present general guidelines for heart healthy nutrition including fat, fiber, MyPlate, the role of sodium in heart healthy nutrition, utilization of the nutrition label, and utilization of this knowledge for meal planning. Follow up email sent as well. Written material given at graduation. Flowsheet Row Pulmonary Rehab from 12/10/2022 in [facility] Cardiac and Pulmonary Rehab  Date 12/03/22  Alberteen Sam 2]  Educator [facility]  Instruction Review Code 1- Verbalizes Understanding       Biometrics:   Post Biometrics - 12/08/22 1045        Post  Biometrics   Height 5\' 7"  (1.702 m)    Weight 174 lb 6.4 oz (79.1 kg)    Waist Circumference 44 inches    Hip Circumference 38.5 inches    Waist to Hip Ratio 1.14 %    BMI (Calculated) 27.31    Single Leg Stand 2.5 seconds             Nutrition Therapy Plan and Nutrition Goals:   Nutrition Assessments:  MEDIFICTS Score Key: ?70 Need to make dietary changes  40-70 Heart Healthy Diet ? 40 Therapeutic Level Cholesterol Diet  Flowsheet Row Pulmonary Rehab from 12/10/2022 in [facility] Cardiac and Pulmonary Rehab   Picture Your Plate Total Score on Discharge 51      Picture Your Plate Scores: <40 Unhealthy dietary pattern with much room for improvement. 41-50 Dietary pattern unlikely to meet recommendations for good health and room for improvement. 51-60 More healthful dietary pattern, with some room for improvement.  >60 Healthy dietary pattern, although there may be some specific behaviors that could be improved.   Nutrition Goals Re-Evaluation:  Nutrition Goals Re-Evaluation     Row Name 11/17/22 0930 11/24/22 0926 12/15/22 0931         Goals   Nutrition Goal ST: eat small/frequent meals/snacks that include fiber, fat, and protein, drink ensure daily LT: meet protein and energy needs, eat a variety of whole foods ST: eat small/frequent meals/snacks that include fiber, fat, and protein, drink ensure daily LT: meet protein and energy needs, eat a  variety of whole foods ST: eat small/frequent meals/snacks that include fiber, fat, and protein, drink ensure daily LT: meet protein and energy needs, eat a variety of whole foods     Comment Monda reports she is drinking Ensure in the morning. more hungry. She does report eating snacks suc has pretzels, which she has added PB. She reports she has a hard time eating a variety of foods due to texture. She does enjoy eating grapes. Her goal is to lose weight but retierated to ensure she is eating enough protein to maintain muscle mass. Disccused with RD who will follow up. Followed up with Aniston and discussed ways she could try to incorporate a variety of vegetables aside from potatoes - examples include blending vegetables into sauces like zucchini or peppers or spinach and smashing beans into the potatoes she eats. Also discussed the importance of including vegetables in her diet. Marytza reports having no interest in trying to include any vegetables aside from potatoes into her diet at this time. Encouraged Maguire to include protein rich foods at meal/snacks and  eating small/frequent meals to support lean muscle tissue. Mylo is getting ready to graduate in the next couple of weeks. She drinks an Ensure daily to help with her protein intake and snacking. She just met with the RD a couple weeks ago to re-review a couple things. Texture is her biggest barrier. She does not have any questions at this time and will continue to work towards the RD established goals after she graduates.     Expected Outcome Short: Meet RD when able to review Long: Continue to eat healthy pulmonary based diet that suits her lifestyle ST: eat small/frequent meals/snacks that include fiber, fat, and protein, drink ensure daily LT: meet protein and energy needs, eat a variety of whole foods Short: Graduate Long: Continue to eat healthy pulmonary based diet              Nutrition Goals Discharge (Final Nutrition Goals Re-Evaluation):  Nutrition Goals Re-Evaluation - 12/15/22 0931       Goals   Nutrition Goal ST: eat small/frequent meals/snacks that include fiber, fat, and protein, drink ensure daily LT: meet protein and energy needs, eat a variety of whole foods    Comment Adin is getting ready to graduate in the next couple of weeks. She drinks an Ensure daily to help with her protein intake and snacking. She just met with the RD a couple weeks ago to re-review a couple things. Texture is her biggest barrier. She does not have any questions at this time and will continue to work towards the RD established goals after she graduates.    Expected Outcome Short: Graduate Long: Continue to eat healthy pulmonary based diet             Psychosocial: Target Goals: Acknowledge presence or absence of significant depression and/or stress, maximize coping skills, provide positive support system. Participant is able to verbalize types and ability to use techniques and skills needed for reducing stress and depression.   Education: Stress, Anxiety, and Depression - Group verbal and  visual presentation to define topics covered.  Reviews how body is impacted by stress, anxiety, and depression.  Also discusses healthy ways to reduce stress and to treat/manage anxiety and depression.  Written material given at graduation. Flowsheet Row Pulmonary Rehab from 12/10/2022 in [facility] Cardiac and Pulmonary Rehab  Date 10/29/22  Educator [facility]  Instruction Review Code 1- Verbalizes Understanding       Education:  Sleep Hygiene -Provides group verbal and written instruction about how sleep can affect your health.  Define sleep hygiene, discuss sleep cycles and impact of sleep habits. Review good sleep hygiene tips.    Initial Review & Psychosocial Screening:   Quality of Life Scores:  Quality of Life - 12/10/22 1414       Quality of Life   Select Quality of Life            Scores of 19 and below usually indicate a poorer quality of life in these areas.  A difference of  2-3 points is a clinically meaningful difference.  A difference of 2-3 points in the total score of the Quality of Life Index has been associated with significant improvement in overall quality of life, self-image, physical symptoms, and general health in studies assessing change in quality of life.  PHQ-9: Review Flowsheet  More data exists      12/10/2022 11/23/2022 11/09/2022 08/13/2022 07/27/2022  Depression screen PHQ 2/9  Decreased Interest 0  0 0  0  Down, Depressed, Hopeless 0  0 0  0  PHQ - 2 Score 0  0 0  0  Altered sleeping 0  0 - - 0  Tired, decreased energy 0  0 - - 0  Change in appetite 0  0 - - 0  Feeling bad or failure about yourself  0  0 - - 0  Trouble concentrating 0  0 - - 0  Moving slowly or fidgety/restless 0  0 - - 0  Suicidal thoughts 0  0 - - 0  PHQ-9 Score 0  0 - - 0  Difficult doing work/chores - Not difficult at all - - Not difficult at all    Details       Information is confidential and restricted. Go to Review Flowsheets to unlock data.   Multiple values from one day  are sorted in reverse-chronological order        Interpretation of Total Score  Total Score Depression Severity:  1-4 = Minimal depression, 5-9 = Mild depression, 10-14 = Moderate depression, 15-19 = Moderately severe depression, 20-27 = Severe depression   Psychosocial Evaluation and Intervention:   Psychosocial Re-Evaluation:  Psychosocial Re-Evaluation     Row Name 11/17/22 [phone] 12/15/22 0936           Psychosocial Re-Evaluation   Current issues with None Identified;Current Psychotropic Meds Current Psychotropic Meds      Comments Aasiyah states she is doing well mentally. She currently takes Buspar and Effexor which she states is doing well for her. She continues to sleep well and uses her oxygen at night as prescribed. She declines any other concerns with stress or other mental health issues at this time. Eldana is getting ready to graduate in the next couple of weeks and has enjoyed the program! She enjoyed doing the exercises and talking with the staff. She liked that it  held her accountable. She is staying compliant taking her psychosocial medications and does not feel she needs any changes at this time as she feels well overall. Her sister is trying to get her to join a gym with her after graduation, and in the meantime she will continue to walk outside! Denies other concerns at this time.      Expected Outcomes Short: Continue to exercise for mood boost ans stay compliant with medications Long: Continue to utilize exercise for stress management and maintain positive attitude Short: Graduate Long: Continue to maintain positive attitude  Interventions Encouraged to attend Pulmonary Rehabilitation for the exercise Encouraged to attend Pulmonary Rehabilitation for the exercise      Continue Psychosocial Services  Follow up required by staff Follow up required by staff               Psychosocial Discharge (Final Psychosocial Re-Evaluation):  Psychosocial Re-Evaluation -  12/15/22 0936       Psychosocial Re-Evaluation   Current issues with Current Psychotropic Meds    Comments Samiya is getting ready to graduate in the next couple of weeks and has enjoyed the program! She enjoyed doing the exercises and talking with the staff. She liked that it  held her accountable. She is staying compliant taking her psychosocial medications and does not feel she needs any changes at this time as she feels well overall. Her sister is trying to get her to join a gym with her after graduation, and in the meantime she will continue to walk outside! Denies other concerns at this time.    Expected Outcomes Short: Graduate Long: Continue to maintain positive attitude    Interventions Encouraged to attend Pulmonary Rehabilitation for the exercise    Continue Psychosocial Services  Follow up required by staff             Education: Education Goals: Education classes will be provided on a weekly basis, covering required topics. Participant will state understanding/return demonstration of topics presented.  Learning Barriers/Preferences:   General Pulmonary Education Topics:  Infection Prevention: - Provides verbal and written material to individual with discussion of infection control including proper hand washing and proper equipment cleaning during exercise session. Flowsheet Row Pulmonary Rehab from 12/10/2022 in [facility] Cardiac and Pulmonary Rehab  Date 07/16/22  Educator  [facility]  Instruction Review Code 1- Verbalizes Understanding       Falls Prevention: - Provides verbal and written material to individual with discussion of falls prevention and safety. Flowsheet Row Pulmonary Rehab from 12/10/2022 in [facility] Cardiac and Pulmonary Rehab  Date 07/16/22  Educator [facility]  Instruction Review Code 1- Verbalizes Understanding       Chronic Lung Disease Review: - Group verbal instruction with posters, models, PowerPoint presentations and videos,  to review new updates, new  respiratory medications, new advancements in procedures and treatments. Providing information on websites and "800" numbers for continued self-education. Includes information about supplement oxygen, available portable oxygen systems, continuous and intermittent flow rates, oxygen safety, concentrators, and Medicare reimbursement for oxygen. Explanation of Pulmonary Drugs, including class, frequency, complications, importance of spacers, rinsing mouth after steroid MDI's, and proper cleaning methods for nebulizers. Review of basic lung anatomy and physiology related to function, structure, and complications of lung disease. Review of risk factors. Discussion about methods for diagnosing sleep apnea and types of masks and machines for OSA. Includes a review of the use of types of environmental controls: home humidity, furnaces, filters, dust mite/pet prevention, HEPA vacuums. Discussion about weather changes, air quality and the benefits of nasal washing. Instruction on Warning signs, infection symptoms, calling MD promptly, preventive modes, and value of vaccinations. Review of effective airway clearance, coughing and/or vibration techniques. Emphasizing that all should Create an Action Plan. Written material given at graduation. Flowsheet Row Pulmonary Rehab from 12/10/2022 in [facility] Cardiac and Pulmonary Rehab  Education need identified 07/27/22  Date 10/22/22  Educator [facility]  Instruction Review Code 1- Verbalizes Understanding       AED/CPR: - Group verbal and written instruction with the use of models to demonstrate the basic use  of the AED with the basic ABC's of resuscitation.    Anatomy and Cardiac Procedures: - Group verbal and visual presentation and models provide information about basic cardiac anatomy and function. Reviews the testing methods done to diagnose heart disease and the outcomes of the test results. Describes the treatment choices: Medical Management, Angioplasty, or Coronary Bypass  Surgery for treating various heart conditions including Myocardial Infarction, Angina, Valve Disease, and Cardiac Arrhythmias.  Written material given at graduation. Flowsheet Row Pulmonary Rehab from 12/10/2022 in [facility] Cardiac and Pulmonary Rehab  Date 12/10/22  Educator SB  Instruction Review Code 1- Verbalizes Understanding       Medication Safety: - Group verbal and visual instruction to review commonly prescribed medications for heart and lung disease. Reviews the medication, class of the drug, and side effects. Includes the steps to properly store meds and maintain the prescription regimen.  Written material given at graduation. Flowsheet Row Pulmonary Rehab from 12/10/2022 in [facility] Cardiac and Pulmonary Rehab  Date 10/08/22  Educator [facility]  Instruction Review Code 1- Verbalizes Understanding       Other: -Provides group and verbal instruction on various topics (see comments) Flowsheet Row Pulmonary Rehab from 12/10/2022 in [facility] Cardiac and Pulmonary Rehab  Date 10/01/22  Educator SB  Instruction Review Code 1- Verbalizes Understanding       Knowledge Questionnaire Score:  Knowledge Questionnaire Score - 12/10/22 1412       Knowledge Questionnaire Score   Pre Score 14/18    Post Score 17/18              Core Components/Risk Factors/Patient Goals at Admission:   Education:Diabetes - Individual verbal and written instruction to review signs/symptoms of diabetes, desired ranges of glucose level fasting, after meals and with exercise. Acknowledge that pre and post exercise glucose checks will be done for 3 sessions at entry of program.   Know Your Numbers and Heart Failure: - Group verbal and visual instruction to discuss disease risk factors for cardiac and pulmonary disease and treatment options.  Reviews associated critical values for Overweight/Obesity, Hypertension, Cholesterol, and Diabetes.  Discusses basics of heart failure: signs/symptoms and treatments.   Introduces Heart Failure Zone chart for action plan for heart failure.  Written material given at graduation. Flowsheet Row Pulmonary Rehab from 12/10/2022 in [facility] Cardiac and Pulmonary Rehab  Date 08/06/22  Educator SB  Instruction Review Code 1- Verbalizes Understanding       Core Components/Risk Factors/Patient Goals Review:   Goals and Risk Factor Review     Row Name 11/17/22 0934 12/15/22 0932           Core Components/Risk Factors/Patient Goals Review   Personal Goals Review Improve shortness of breath with ADL's;Hypertension Improve shortness of breath with ADL's;Hypertension;Weight Management/Obesity      Review Tamyka feels liker her breathing is not as wheezy as she used to be, and feels like her endurance has improved. She is  limited with hips and back pain when walking but able to manage OK. She is supposed to get an epidural soon which she hopes will allieviate from pain. She continues PLB but her allergies are effecting her significantly right now. She is taking medications OK. BP at home is being checked routinely and ranges around 130s/80s. Rehab they are around the same. Derinda has felt a significant difference in improving SOB, she doesn't feel as short winded. She continues to use PLB when needed. She continues to check her  BP at home which is running  130-140s/70-80s. Her doctor recently changed her BP meds and she is noticing it is running a little higher than normal. Encouraged her to call her MD to let her know and to take a daily log to present to her doctor. Rehab pressures have been good. Her weight has been steady, though she is limited with her diet due to barriers with texture. Salvador is getting ready to graduate in the next couple of weeks and denies other concerns at this time.      Expected Outcomes Short: Continue to monitor BP and get through allergy season Long: Continue to manage lifestyle risk factors Short: Graduate Long: Continue to monitor life-style risk  factors, stay compliant               Core Components/Risk Factors/Patient Goals at Discharge (Final Review):   Goals and Risk Factor Review - 12/15/22 0932       Core Components/Risk Factors/Patient Goals Review   Personal Goals Review Improve shortness of breath with ADL's;Hypertension;Weight Management/Obesity    Review Quanetta has felt a significant difference in improving SOB, she doesn't feel as short winded. She continues to use PLB when needed. She continues to check her  BP at home which is running 130-140s/70-80s. Her doctor recently changed her BP meds and she is noticing it is running a little higher than normal. Encouraged her to call her MD to let her know and to take a daily log to present to her doctor. Rehab pressures have been good. Her weight has been steady, though she is limited with her diet due to barriers with texture. Maryna is getting ready to graduate in the next couple of weeks and denies other concerns at this time.    Expected Outcomes Short: Graduate Long: Continue to monitor life-style risk factors, stay compliant             ITP Comments:  ITP Comments     Row Name 11/04/22 0802 12/02/22 0819 12/29/22 1038 12/30/22 0805     ITP Comments 30 Day review completed. Medical Director ITP review done, changes made as directed, and signed approval by Medical Director. 30 day review completed. ITP sent to Dr. Jinny Sanders, Medical Director of  Pulmonary Rehab. Continue with ITP unless changes are made by physician. Kemyah graduated today from  rehab with 36 sessions completed.  Details of the patient's exercise prescription and what She needs to do in order to continue the prescription and progress were discussed with patient.  Patient was given a copy of prescription and goals.  Patient verbalized understanding.  Oletha plans to continue to exercise by exercising at home.Marland Kitchen discharged             Comments: discharge ITP

## 2023-01-05 ENCOUNTER — Ambulatory Visit: Payer: Medicare Other | Admitting: Pulmonary Disease

## 2023-01-05 ENCOUNTER — Encounter: Payer: Self-pay | Admitting: Pulmonary Disease

## 2023-01-05 VITALS — BP 140/90 | HR 83 | Temp 98.2°F | Ht 69.0 in | Wt 174.2 lb

## 2023-01-05 DIAGNOSIS — Z87891 Personal history of nicotine dependence: Secondary | ICD-10-CM | POA: Diagnosis not present

## 2023-01-05 DIAGNOSIS — J449 Chronic obstructive pulmonary disease, unspecified: Secondary | ICD-10-CM | POA: Diagnosis not present

## 2023-01-05 DIAGNOSIS — R918 Other nonspecific abnormal finding of lung field: Secondary | ICD-10-CM | POA: Diagnosis not present

## 2023-01-05 NOTE — Progress Notes (Addendum)
Subjective:    Patient ID: Gabrielle Pineda, female    DOB: 09-08-1955, 67 y.o.   MRN: [id number] Patient Care Team: [name], PA-C as PCP - General (Physician Assistant) Antonieta Iba, MD as PCP - Cardiology (Cardiology) Gaspar Cola, [facility] (Pharmacist) Jomarie Longs, MD as Consulting Physician (Psychiatry) Edward Jolly, MD as Consulting Physician (Pain Medicine)  Chief Complaint  Patient presents with   Follow-up    No SOB. A little wheezing. Cough with clear sputum.     HPI This is a 67 year old female, former smoker quit in 2022 (80 PY), initially seen for pulmonary consult December 11, 2021 for shortness of breath and COPD, abnormal CT chest with lung nodularity.  She has rheumatoid arthritis and is maintained on [place].  She presents today for follow-up.  Last seen here on 25 November 2022 by me.  This is a scheduled visit.  She quit smoking approximately a year ago.  She has maintained on Breztri for COPD issues.  She uses albuterol but usually requires it only once or twice a week.  She was enrolled in pulmonary rehabilitation and completed the program and feels that that helped her tremendously.  She is keeping up with the exercises recommended during pulmonary rehab.  She does note seasonal variation with her symptoms however so far has done well this season.  She is compliant with Breztri 2 puffs twice a day.  She has not had any fevers, chills or sweats.  No cough or sputum production. She was referred to lung cancer screening program and will have her first scan towards November.   She does not endorse any other symptomatology today.  She has chronic use of accessories that appears less pronounced on today's visit.    DATA 07/29/2017 simple spirometry: Performed for disability determination FEV1 2.26 L or 79% predicted FVC 3.05 L or 86% predicted FEV1/FVC 74% 11/01/2020 [facility]): Normal left ventricle, LVEF over 55%, right ventricle normal in size with  normal systolic function, mild tricuspid regurgitation, no pulmonary hypertension, no wall motion abnormalities 12/18/2021 CT high-res chest: No evidence of fibrotic interstitial lung disease, moderate emphysema and diffuse bronchial wall thickening, mild tracheobronchomalacia, irregular nodules of the left upper lobe and dependent right lower lobe measuring 8 mm, nonspecific.  Fine centrilobular nodularity concentrated in the lung bases consistent with smoking-related respiratory bronchiolitis, coronary artery disease 02/04/2022 PFTs: FEV1 1.45 L or 49% predicted, FVC 2.24 L or 59% predicted, FEV1/FVC 65%, lung volumes normal with modest air trapping,moderately to severe diffusion defect.  Flow volume loop consistent with airway obstruction, no evidence of fixed or variable upper airway obstruction.  There is significant decline in lung function when compared to the 2018 simple spirometry treatments 02/13/2022 overnight oximetry: Show desaturations of less than 88% for over 2 hours patient qualified for nocturnal oxygen 06/29/2022 CT chest: Previously noted left upper lobe and posterior right lower lobe pulmonary nodules resolved.  Tiny 3 mm anterior upper lobe pulmonary nodule suggest follow-up CT chest in 12 to 18 months.  Moderate centrilobular emphysema with mild diffuse bronchial wall thickening compatible with COPD  Review of Systems A 10 point review of systems was performed and it is as noted above otherwise negative.  Patient Active Problem List   Diagnosis Date Noted   Localized edema 11/23/2022   MDD (major depressive disorder), recurrent, in full remission (HCC) 03/31/2022   Macrocytic anemia 02/20/2022   Overweight (BMI 25.0-29.9) 02/20/2022   COPD (chronic obstructive pulmonary disease) (HCC) 02/06/2022  Lung nodules 02/06/2022   Allergic rhinitis 02/06/2022   GAD (generalized anxiety disorder) 12/26/2021   Alcohol use disorder, moderate, in sustained remission (HCC) 12/26/2021    Need for vaccination against Streptococcus pneumoniae 11/17/2021   Sleep disorder 10/23/2021   Iron deficiency anemia 06/26/2021   Depression, major, single episode, moderate (HCC) 06/26/2021   Status post total hip replacement, right 04/15/2021   Chronic radicular lumbar pain 10/03/2020   Postlaminectomy syndrome, lumbar region 06/03/2020   Moderate episode of recurrent major depressive disorder (HCC) 12/04/2019   Spondylosis of cervical region without myelopathy or radiculopathy 09/21/2019   DDD (degenerative disc disease), cervical 09/21/2019   Cervicalgia 09/21/2019   Cervical fusion syndrome 09/21/2019   Chronic pain syndrome 09/21/2019   Spinal stenosis, lumbar region, with neurogenic claudication 08/15/2019   History of adenomatous polyp of colon 11/04/2017   Cervical radiculopathy 08/02/2017   Neuropathy 08/02/2017   Essential hypertension 07/28/2017   Closed compression fracture of L5 lumbar vertebra 07/07/2017   Sacral insufficiency fracture with routine healing 07/07/2017   Family history of malignant neoplasm of pancreas 04/03/2015   Hypercholesteremia 04/03/2015   Disorder of iron metabolism 04/03/2015   Carpal tunnel syndrome 03/29/2015   Acid reflux 10/11/2014   Closed fracture of distal phalanx of thumb 08/15/2014   Arthritis, degenerative 01/30/2014   Arthritis or polyarthritis, rheumatoid (HCC) 01/30/2014   Paroxysmal supraventricular tachycardia 09/22/2010   B-complex deficiency 09/09/2007   CN (constipation) 06/26/2007   Clinical depression 06/26/2007   Cold sore 06/26/2007   H/O alcohol abuse 06/26/2007   Cannot sleep 06/26/2007   Localized osteoarthrosis, hand 06/26/2007   Menopausal symptom 06/26/2007   Social History   Tobacco Use   Smoking status: Former    Packs/day: 2.00    Years: 45.00    Additional pack years: 0.00    Total pack years: 90.00    Types: Cigarettes    Quit date: 10/28/2020    Years since quitting: 2.1   Smokeless tobacco:  Never   Tobacco comments:    [name] been quit for about a year now.  Substance Use Topics   Alcohol use: No    Comment: recovering alcoholic Since 1994    Allergies  Allergen Reactions   Sulfasalazine Other (See Comments)   Plaquenil [Hydroxychloroquine] Rash   Current Meds  Medication Sig   acyclovir ointment (ZOVIRAX) 5 % :Topical Every 3 Hours as needed for lesion   albuterol (PROVENTIL) (2.5 MG/3ML) 0.083% nebulizer solution Take 3 mLs (2.5 mg total) by nebulization every 4 (four) hours as needed for wheezing or shortness of breath.   albuterol (VENTOLIN HFA) 108 (90 Base) MCG/ACT inhaler INHALE TWO PUFFS BY MOUTH INTO LUNGS every SIX hours AS NEEDED FOR WHEEZING AND/OR SHORTNESS OF BREATH   ARIPiprazole (ABILIFY) 2 MG tablet Take 1 tablet (2 mg total) by mouth daily. Stop Abilify 5 mg - dose change   atorvastatin (LIPITOR) 40 MG tablet TAKE ONE TABLET BY MOUTH EVERY MORNING   Budeson-Glycopyrrol-Formoterol (BREZTRI AEROSPHERE) 160-9-4.8 MCG/ACT AERO Inhale 2 puffs into the lungs in the morning and at bedtime.   carvedilol (COREG) 12.5 MG tablet TAKE ONE TABLET BY MOUTH EVERY MORNING and TAKE ONE TABLET BY MOUTH EVERYDAY AT BEDTIME   dextromethorphan-guaiFENesin (MUCINEX DM) 30-600 MG 12hr tablet Take 1 tablet by mouth every 12 (twelve) hours.   ezetimibe (ZETIA) 10 MG tablet TAKE ONE TABLET BY MOUTH EVERY MORNING   fexofenadine (ALLEGRA) 180 MG tablet Take 180 mg by mouth daily.   furosemide (LASIX) 20  MG tablet Take 1 tablet (20 mg total) by mouth daily.   ipratropium (ATROVENT) 0.03 % nasal spray Place 2 sprays into both nostrils every 12 (twelve) hours.   leflunomide (ARAVA) 20 MG tablet Take 20 mg by mouth at bedtime.   LINZESS 145 MCG CAPS capsule Take 145 mcg by mouth daily before breakfast.   losartan (COZAAR) 25 MG tablet Take 1 tablet (25 mg total) by mouth daily.   meloxicam (MOBIC) 15 MG tablet Take 15 mg by mouth at bedtime.   mirtazapine (REMERON) 15 MG tablet TAKE ONE  TABLET BY MOUTH EVERYDAY AT BEDTIME   montelukast (SINGULAIR) 10 MG tablet TAKE ONE TABLET BY MOUTH EVERYDAY AT BEDTIME   Multiple Vitamin (MULTIVITAMIN WITH MINERALS) TABS tablet Take 1 tablet by mouth daily.   Nebulizer MISC Compressor nebulizer to use with nebulized medications as instructed   omeprazole (PRILOSEC) 40 MG capsule TAKE ONE CAPSULE BY MOUTH EVERY MORNING and TAKE ONE CAPSULE BY MOUTH EVERYDAY AT BEDTIME   oxyCODONE-acetaminophen (PERCOCET) 5-325 MG tablet Take 1 tablet by mouth every 8 (eight) hours as needed for severe pain. Must last 30 days.   Oxymetazoline HCl (MUCINEX NASAL SPRAY MOISTURE NA) Place into the nose.   potassium chloride SA (KLOR-CON M) 20 MEQ tablet TAKE ONE TABLET BY MOUTH EVERY MORNING   SPIKEVAX syringe    topiramate (TOPAMAX) 25 MG tablet Take 1 tablet (25 mg total) by mouth 2 (two) times daily.   valACYclovir (VALTREX) 1000 MG tablet TAKE TWO TABLETS BY MOUTH TWICE DAILY AS NEEDED FOR 5 DAYS FOR FEVER blisters   venlafaxine XR (EFFEXOR-XR) 75 MG 24 hr capsule TAKE THREE CAPSULES BY MOUTH EVERY MORNING   XIIDRA 5 % SOLN Place 1 drop into both eyes daily.   Immunization History  Administered Date(s) Administered   Fluad Quad(high Dose 65+) 10/01/2021, 05/14/2022   Influenza Split 09/07/2012   Influenza,inj,Quad PF,6+ Mos 11/18/2015, 07/23/2016, 07/02/2017, 04/14/2018, 04/17/2019, 04/19/2020   Influenza-Unspecified 09/24/2014   PFIZER(Purple Top)SARS-COV-2 Vaccination 11/21/2019, 12/12/2019   PNEUMOCOCCAL CONJUGATE-20 11/17/2021   Pneumococcal Polysaccharide-23 02/12/2011, 09/24/2014, 04/19/2020   Tdap 02/12/2011, 11/15/2015       Objective:   Physical Exam BP (!) 140/90 (BP Location: Left Arm, Cuff Size: Normal)   Pulse 83   Temp 98.2 F (36.8 C)   Ht 5\' 9"  (1.753 m)   Wt 174 lb 3.2 oz (79 kg)   SpO2 96%   BMI 25.72 kg/m   SpO2: 96 % O2 Device: None (Room air)  GENERAL: Well-developed, overweight woman, no acute distress.  Uses  accessories of respiration appears to be on chronic basis.  Fully ambulatory.  No conversational dyspnea. HEAD: Normocephalic, atraumatic.  EYES: Pupils equal, round, reactive to light.  No scleral icterus.  MOUTH: Poor dentition, oral mucosa moist.  No thrush. NECK: Supple. No thyromegaly. Trachea midline. No JVD.  No adenopathy. PULMONARY: Good air entry bilaterally.  She has scattered wheezing throughout. CARDIOVASCULAR: S1 and S2. Regular rate and rhythm.  ABDOMEN: Benign. MUSCULOSKELETAL: Stigmata of rheumatoid arthritis on both hands, no clubbing, no edema.  NEUROLOGIC: No overt focal deficit.  No gait disturbance.  Speech is fluent. SKIN: Intact,warm,dry. PSYCH: Mood and behavior normal.     Assessment & Plan:     ICD-10-CM   1. Stage 3 severe COPD by GOLD classification (HCC)  J44.9    Continue Breztri 2 puffs twice a day Continue as needed albuterol    2. Lung nodules  R91.8    Enrolled in lung cancer  screening program To start November 2024    3. Former heavy cigarette smoker (20-39 per day)  Z87.891    No evidence of relapse     Will see the patient in follow-up in 4 months time she is to call sooner should any new problems arise.  Gailen Shelter, MD Advanced Bronchoscopy PCCM [facility]    *This note was dictated using voice recognition software/Dragon.  Despite best efforts to proofread, errors can occur which can change the meaning. Any transcriptional errors that result from this process are unintentional and may not be fully corrected at the time of dictation.  Addendum created 29 January 2023:  Surgical risk assessment with regards to pulmonary disease, requested by gastroenterology, for the purpose of colonoscopy under general anesthesia.  The patient was last seen on 05 Jan 2023 and was well compensated at that time.  She has stage III severe COPD however appears to be currently adequately managed with Breztri 2 puffs twice a day and as  needed albuterol.  She is on nocturnal oxygen but does not require oxygen during the day.  She has nocturnal hypoxemia associated with COPD.  From the pulmonary standpoint the patient is a moderate risk for potential complications after general anesthesia's include atelectasis, pneumonia, need for ventilatory assistance postprocedure etc.  She is as compensated as she is to get in this risk cannot be further minimized.  Gailen Shelter, MD Advanced Bronchoscopy PCCM [facility]    *This note was dictated using voice recognition software/Dragon.  Despite best efforts to proofread, errors can occur which can change the meaning. Any transcriptional errors that result from this process are unintentional and may not be fully corrected at the time of dictation.

## 2023-01-05 NOTE — Patient Instructions (Signed)
Your lungs sounded very clear today.  Continue taking Breztri 2 puffs twice a day.  Continue using your albuterol as needed.  We will see you in follow-up in 4 months time call sooner should any new problems arise.

## 2023-01-08 ENCOUNTER — Telehealth: Payer: Self-pay

## 2023-01-08 NOTE — Progress Notes (Cosign Needed)
Care Management & Coordination Services Pharmacy Team Pharmacy Assistant   Name: Gabrielle Pineda  MRN: [id number] DOB: 1956/05/31  Reason for Encounter: Medication Coordination and Delivery for [facility]  Contacted patient to discuss medications and coordinate delivery from [facility]. Spoke with patient on 01/08/2023   Chart review: Recent office visits:  None ID  Recent consult visits:  01/05/2023 Sarina Ser, MD (Pulmonary Rehab) for Follow-up- Stopped: Prednisone, No orders placed, BP: 140/90, Patient to follow-up in 4 months  05/142024 Cora Collum, RN (Pulmonary Rehab) No medication changes noted, No orders placed, No follow-up noted  05/092024 Lanny Hurst, RN (Pulmonary Rehab) No medication changes noted, No orders placed, No follow-up noted  05/072024 Cora Collum, RN (Pulmonary Rehab) No medication changes noted, No orders placed, No follow-up noted  12/17/2022 Arnold Long (Podiatry) I am unable to view this note  12/17/2022 Fabio Pierce, RN (Pulmonary Rehab) No medication changes noted, No orders placed, No follow-up noted  12/15/2022 Cora Collum, RN (Pulmonary Rehab) No medication changes noted, No orders placed, No follow-up noted  12/11/2022 Internal Medicine  [facility]- I am unable to view this note  12/10/2022 Jomarie Longs, MD (Behavioral Health) I did not break the glass so unsure if any changes were made   Hospital visits:  None in previous 6 months  Medications: Outpatient Encounter Medications as of 01/08/2023  Medication Sig   acyclovir ointment (ZOVIRAX) 5 % :Topical Every 3 Hours as needed for lesion   albuterol (PROVENTIL) (2.5 MG/3ML) 0.083% nebulizer solution Take 3 mLs (2.5 mg total) by nebulization every 4 (four) hours as needed for wheezing or shortness of breath.   albuterol (VENTOLIN HFA) 108 (90 Base) MCG/ACT inhaler INHALE TWO PUFFS BY MOUTH INTO LUNGS every SIX hours AS NEEDED FOR WHEEZING AND/OR  SHORTNESS OF BREATH   ARIPiprazole (ABILIFY) 2 MG tablet Take 1 tablet (2 mg total) by mouth daily. Stop Abilify 5 mg - dose change   atorvastatin (LIPITOR) 40 MG tablet TAKE ONE TABLET BY MOUTH EVERY MORNING   Budeson-Glycopyrrol-Formoterol (BREZTRI AEROSPHERE) 160-9-4.8 MCG/ACT AERO Inhale 2 puffs into the lungs in the morning and at bedtime.   carvedilol (COREG) 12.5 MG tablet TAKE ONE TABLET BY MOUTH EVERY MORNING and TAKE ONE TABLET BY MOUTH EVERYDAY AT BEDTIME   dextromethorphan-guaiFENesin (MUCINEX DM) 30-600 MG 12hr tablet Take 1 tablet by mouth every 12 (twelve) hours.   ezetimibe (ZETIA) 10 MG tablet TAKE ONE TABLET BY MOUTH EVERY MORNING   fexofenadine (ALLEGRA) 180 MG tablet Take 180 mg by mouth daily.   furosemide (LASIX) 20 MG tablet Take 1 tablet (20 mg total) by mouth daily.   gabapentin (NEURONTIN) 400 MG capsule Take 1 capsule (400 mg total) by mouth 2 (two) times daily.   ipratropium (ATROVENT) 0.03 % nasal spray Place 2 sprays into both nostrils every 12 (twelve) hours.   leflunomide (ARAVA) 20 MG tablet Take 20 mg by mouth at bedtime.   LINZESS 145 MCG CAPS capsule Take 145 mcg by mouth daily before breakfast.   losartan (COZAAR) 25 MG tablet Take 1 tablet (25 mg total) by mouth daily.   meloxicam (MOBIC) 15 MG tablet Take 15 mg by mouth at bedtime.   methylPREDNISolone (MEDROL DOSEPAK) 4 MG TBPK tablet Take as directed in the package. (Patient not taking: Reported on 01/05/2023)   mirtazapine (REMERON) 15 MG tablet TAKE ONE TABLET BY MOUTH EVERYDAY AT BEDTIME   montelukast (SINGULAIR) 10 MG tablet TAKE ONE TABLET BY MOUTH EVERYDAY AT BEDTIME  Multiple Vitamin (MULTIVITAMIN WITH MINERALS) TABS tablet Take 1 tablet by mouth daily.   Nebulizer MISC Compressor nebulizer to use with nebulized medications as instructed   omeprazole (PRILOSEC) 40 MG capsule TAKE ONE CAPSULE BY MOUTH EVERY MORNING and TAKE ONE CAPSULE BY MOUTH EVERYDAY AT BEDTIME   oxyCODONE-acetaminophen  (PERCOCET) 5-325 MG tablet Take 1 tablet by mouth every 8 (eight) hours as needed for severe pain. Must last 30 days.   Oxymetazoline HCl (MUCINEX NASAL SPRAY MOISTURE NA) Place into the nose.   potassium chloride SA (KLOR-CON M) 20 MEQ tablet TAKE ONE TABLET BY MOUTH EVERY MORNING   SPIKEVAX syringe    topiramate (TOPAMAX) 25 MG tablet Take 1 tablet (25 mg total) by mouth 2 (two) times daily.   valACYclovir (VALTREX) 1000 MG tablet TAKE TWO TABLETS BY MOUTH TWICE DAILY AS NEEDED FOR 5 DAYS FOR FEVER blisters   venlafaxine XR (EFFEXOR-XR) 75 MG 24 hr capsule TAKE THREE CAPSULES BY MOUTH EVERY MORNING   XIIDRA 5 % SOLN Place 1 drop into both eyes daily.   No facility-administered encounter medications on file as of 01/08/2023.   BP Readings from Last 3 Encounters:  01/05/23 (!) 140/90  12/14/22 (!) 167/86  12/01/22 (!) 148/88    Pulse Readings from Last 3 Encounters:  01/05/23 83  12/14/22 69  12/01/22 74    Lab Results  Component Value Date/Time   HGBA1C 5.3 11/23/2022 10:27 AM   HGBA1C 5.3 06/26/2021 09:13 AM   Lab Results  Component Value Date   CREATININE 1.00 09/11/2022   BUN 11 09/11/2022   GFRNONAA >60 09/11/2022   GFRAA 72 05/06/2020   NA 139 09/11/2022   K 4.1 09/11/2022   CALCIUM 8.8 (L) 09/11/2022   CO2 22 09/11/2022   Cycle dispensing form sent to [facility] [employer], [employer]  for review.   Last adherence delivery date: 12/23/2022      Patient is due for next adherence delivery on:  01/21/2023 [facility]  This delivery to include: Adherence Packaging  30 Days  Meloxicam 15 mg tablet- Take one tablet by mouth once daily (Bedtime) Ezetimibe (Zetia) 10 mg tablet Take one tablet by mouth daily (Breakfast) Atorvastatin 40 mg tablet- Take one tablet by mouth daily (Breakfast) Carvedilol 12.5 mg tablet - Take on tablet by mouth twice daily (Breakfast, Bedtime) Mirtazapine 15 mg tablet-Take one tablet daily (Bedtime) Topiramate 25 mg tablet-Take one tablet by mouth twice daily  (Breakfast, Bedtime) Gabapentin 400 mg tablet- Take one tablet by mouth twice daily (Breakfast, Bedtime) Venlafaxine ER 75 mg capsule- Take three capsules by mouth daily (Breakfast) Omeprazole 40 mg- Take one capsule by mouth two times a day (Breakfast, Bedtime) Potassium 20 mEq - Take one capsule daily (Breakfast) Aripiprazole 2 mg tablet- Take one tablet by mouth daily (Breakfast) Leflunomide 20 mg tablet- Take one tablet by mouth once daily (Bedtime) Montelukast (Singulair) 10 mg tablet 1 tablet daily (Bedtime) Ipratropium Spray 0.03% 2 sprays in both nostrils q12h.  Losartan 25 mg 1 tablet daily (Bedtime)  Patient declined the following medications this month: No medications were declined  Refills requested from Speciality providers include: Gabapentin 400 mg Mirtazapine 15 mg Venlafaxine ER 75 mg Aripiprazole 2 mg  Confirmed delivery date of 06/06/20424 [address], advised patient that pharmacy will contact them the morning of delivery.  Any concerns about your medications? No  How often do you forget or accidentally miss a dose? Never  Do you use a pillbox? No  Is patient in packaging Yes  Recent blood pressure readings are as follows: 140/90   Patient has follow-up with CPP on 01/26/2023 @ 0900.  Adelene Idler, CPA/CMA Clinical Pharmacist Assistant Phone: [phone]

## 2023-01-11 ENCOUNTER — Other Ambulatory Visit: Payer: Self-pay | Admitting: Psychiatry

## 2023-01-11 ENCOUNTER — Other Ambulatory Visit: Payer: Self-pay | Admitting: Student in an Organized Health Care Education/Training Program

## 2023-01-11 DIAGNOSIS — F3342 Major depressive disorder, recurrent, in full remission: Secondary | ICD-10-CM

## 2023-01-14 NOTE — Progress Notes (Signed)
Referring Physician:  [name], PA-C [address] [place],  [place] [address]  Primary Physician:  [name], PA-C  History of Present Illness: 01/14/2023 Gabrielle Pineda has a history of RA, COPD, HTN, L5 compression fracture, chronic pain syndrome, history of alcohol abuse, depression.   History of cervical fusion x 2, thoracic SCS 03/17/21, and lumbar laminectomy.   She had lumbar injection with Dr. Cherylann Ratel on 12/14/22- she fell and hit her head resulting in small hematoma and abrasion per his notes.   Since she had the fall, she notes an intermittent headache that is daily. She has history of migraines, but this headache is different. No change in vision. No dizziness. She had a lump on her head and 2 black eyes after the fall. No memory issues.    Conservative measures:  Physical therapy: no recent Multimodal medical therapy including regular antiinflammatories: mobic, medrol dose pack, percocet, neurontin  Injections:  Bilateral L4 TF ESI 12/14/22  Past Surgery:  History of cervical fusion x 2 thoracic SCS 03/17/21 Lumbar laminectomy  Review of Systems:  A 10 point review of systems is negative, except for the pertinent positives and negatives detailed in the HPI.  Past Medical History: Past Medical History:  Diagnosis Date   Alcohol abuse    Allergy 1983   Anemia    Anxiety    Arthritis    Cataract    Cervicalgia    Cirrhosis (HCC) 1994   COPD (chronic obstructive pulmonary disease) (HCC)    Depression    Difficult intubation    has plates and screws in neck   Dyspnea    GERD (gastroesophageal reflux disease)    Headache    Heart murmur    on heard when pt is lying   Hypertension    Neuromuscular disorder (HCC)    Neuropathy   Other and unspecified hyperlipidemia    Oxygen deficiency    Status post insertion of spinal cord stimulator    Tachycardia    d/t questionable anxiety happens every 5- 10 years, sees [facility]     Past Surgical History: Past Surgical History:  Procedure Laterality Date   ANTERIOR CERVICAL DECOMP/DISCECTOMY FUSION  2012, 2015, 2018   x3   AUGMENTATION MAMMAPLASTY Bilateral 1982   BREAST ENHANCEMENT SURGERY  1981   CARPAL TUNNEL RELEASE  11/11/2011   Procedure: CARPAL TUNNEL RELEASE;  Surgeon: [name], MD;  Location: [facility];  Service: Neurosurgery;  Laterality: Right;  Right Median Nerve Decompression   CARPAL TUNNEL RELEASE  2013   COLONOSCOPY WITH PROPOFOL N/A 11/01/2017   Procedure: COLONOSCOPY WITH PROPOFOL;  Surgeon: [name], MD;  Location: [facility] ENDOSCOPY;  Service: Endoscopy;  Laterality: N/A;   ESOPHAGOGASTRODUODENOSCOPY     JOINT REPLACEMENT  2022   LUMBAR LAMINECTOMY/DECOMPRESSION MICRODISCECTOMY N/A 08/15/2019   Procedure: Lumbar three to Sacral one Decompressive lumbar laminectomy;  Surgeon: [name], MD;  Location: [facility] OR;  Service: Neurosurgery;  Laterality: N/A;   neck disc surgery     plates and screws in neck x 2   ROTATOR CUFF REPAIR  06/16/2016   right shoulder    ROTATOR CUFF REPAIR     SPINAL CORD STIMULATOR INSERTION N/A 03/17/2021   Procedure: THORACIC SPINAL CORD STIMULATOR (PERCUTANEOUS) & PULSE GENERATOR PLACEMENT;  Surgeon: [name], MD;  Location: [facility] ORS;  Service: Neurosurgery;  Laterality: N/A;   SPINE SURGERY  2020   TONSILLECTOMY AND ADENOIDECTOMY  1973   TOTAL HIP ARTHROPLASTY Right  04/15/2021   Procedure: TOTAL HIP ARTHROPLASTY ANTERIOR APPROACH;  Surgeon: [name], MD;  Location: [facility] ORS;  Service: Orthopedics;  Laterality: Right;    Allergies: Allergies as of 01/15/2023 - Review Complete 01/05/2023  Allergen Reaction Noted   Sulfasalazine Other (See Comments) 04/17/2022   Plaquenil [hydroxychloroquine] Rash 04/03/2015    Medications: Outpatient Encounter Medications as of 01/15/2023  Medication Sig   acyclovir ointment (ZOVIRAX) 5 % :Topical Every 3 Hours as needed for lesion   albuterol  (PROVENTIL) (2.5 MG/3ML) 0.083% nebulizer solution Take 3 mLs (2.5 mg total) by nebulization every 4 (four) hours as needed for wheezing or shortness of breath.   albuterol (VENTOLIN HFA) 108 (90 Base) MCG/ACT inhaler INHALE TWO PUFFS BY MOUTH INTO LUNGS every SIX hours AS NEEDED FOR WHEEZING AND/OR SHORTNESS OF BREATH   ARIPiprazole (ABILIFY) 2 MG tablet TAKE ONE TABLET BY MOUTH ONCE DAILY. stop abilify 5mg    atorvastatin (LIPITOR) 40 MG tablet TAKE ONE TABLET BY MOUTH EVERY MORNING   Budeson-Glycopyrrol-Formoterol (BREZTRI AEROSPHERE) 160-9-4.8 MCG/ACT AERO Inhale 2 puffs into the lungs in the morning and at bedtime.   carvedilol (COREG) 12.5 MG tablet TAKE ONE TABLET BY MOUTH EVERY MORNING and TAKE ONE TABLET BY MOUTH EVERYDAY AT BEDTIME   dextromethorphan-guaiFENesin (MUCINEX DM) 30-600 MG 12hr tablet Take 1 tablet by mouth every 12 (twelve) hours.   ezetimibe (ZETIA) 10 MG tablet TAKE ONE TABLET BY MOUTH EVERY MORNING   fexofenadine (ALLEGRA) 180 MG tablet Take 180 mg by mouth daily.   furosemide (LASIX) 20 MG tablet Take 1 tablet (20 mg total) by mouth daily.   gabapentin (NEURONTIN) 400 MG capsule Take 1 capsule (400 mg total) by mouth 2 (two) times daily.   ipratropium (ATROVENT) 0.03 % nasal spray Place 2 sprays into both nostrils every 12 (twelve) hours.   leflunomide (ARAVA) 20 MG tablet Take 20 mg by mouth at bedtime.   LINZESS 145 MCG CAPS capsule Take 145 mcg by mouth daily before breakfast.   losartan (COZAAR) 25 MG tablet Take 1 tablet (25 mg total) by mouth daily.   meloxicam (MOBIC) 15 MG tablet Take 15 mg by mouth at bedtime.   methylPREDNISolone (MEDROL DOSEPAK) 4 MG TBPK tablet Take as directed in the package. (Patient not taking: Reported on 01/05/2023)   mirtazapine (REMERON) 15 MG tablet TAKE ONE TABLET BY MOUTH EVERYDAY AT BEDTIME   montelukast (SINGULAIR) 10 MG tablet TAKE ONE TABLET BY MOUTH EVERYDAY AT BEDTIME   Multiple Vitamin (MULTIVITAMIN WITH MINERALS) TABS tablet  Take 1 tablet by mouth daily.   Nebulizer MISC Compressor nebulizer to use with nebulized medications as instructed   omeprazole (PRILOSEC) 40 MG capsule TAKE ONE CAPSULE BY MOUTH EVERY MORNING and TAKE ONE CAPSULE BY MOUTH EVERYDAY AT BEDTIME   oxyCODONE-acetaminophen (PERCOCET) 5-325 MG tablet Take 1 tablet by mouth every 8 (eight) hours as needed for severe pain. Must last 30 days.   Oxymetazoline HCl (MUCINEX NASAL SPRAY MOISTURE NA) Place into the nose.   potassium chloride SA (KLOR-CON M) 20 MEQ tablet TAKE ONE TABLET BY MOUTH EVERY MORNING   SPIKEVAX syringe    topiramate (TOPAMAX) 25 MG tablet Take 1 tablet (25 mg total) by mouth 2 (two) times daily.   valACYclovir (VALTREX) 1000 MG tablet TAKE TWO TABLETS BY MOUTH TWICE DAILY AS NEEDED FOR 5 DAYS FOR FEVER blisters   venlafaxine XR (EFFEXOR-XR) 75 MG 24 hr capsule TAKE THREE CAPSULES BY MOUTH EVERY MORNING   XIIDRA 5 % SOLN Place 1 drop  into both eyes daily.   No facility-administered encounter medications on file as of 01/15/2023.    Social History: Social History   Tobacco Use   Smoking status: Former    Packs/day: 2.00    Years: 45.00    Additional pack years: 0.00    Total pack years: 90.00    Types: Cigarettes    Quit date: 10/28/2020    Years since quitting: 2.2   Smokeless tobacco: Never   Tobacco comments:    [name] been quit for about a year now.  Vaping Use   Vaping Use: Former   Devices: vape occasionally  Substance Use Topics   Alcohol use: No    Comment: recovering alcoholic Since 1994    Drug use: Never    Family Medical History: Family History  Problem Relation Age of Onset   Alcohol abuse Mother    Bipolar disorder Mother    Suicidality Mother    Arthritis Mother    Early death Mother    Pancreatic cancer Father    Alcohol abuse Father    Cancer Father    Drug abuse Sister    Alcohol abuse Sister    Depression Sister    Alcohol abuse Sister    Anxiety disorder Sister    Anesthesia problems  Neg Hx    Breast cancer Neg Hx     Physical Examination: There were no vitals filed for this visit.  General: Patient is well developed, well nourished, calm, collected, and in no apparent distress. Attention to examination is appropriate.  Respiratory: Patient is breathing without any difficulty.   NEUROLOGICAL:     Awake, alert, oriented to person, place, and time.  Speech is clear and fluent. Fund of knowledge is appropriate.   Cranial Nerves: Pupils equal round and reactive to light.  Facial tone is symmetric.    She has small healed abrasion with mild swelling right forehead. She has mild tenderness over swelling.   She has resolving ecchymosis under her right eye.   Extraocular muscles are intact  Facial sensation is intact bilaterally  Facial strength is intact bilaterally  Shoulder shrug strength is intact  Tongue protrudes midline    Strength: Side Biceps Triceps Deltoid Interossei Grip Wrist Ext. Wrist Flex.  R 5 5 5 5 5 5 5   L 5 5 5 5 5 5 5    Side Iliopsoas Quads Hamstring PF DF EHL  R 5 5 5 5 5 5   L 5 5 5 5 5 5    Reflexes are 2+ and symmetric at the biceps, triceps, brachioradialis, patella and achilles.   Hoffman's is absent.  Clonus is not present.   Bilateral upper and lower extremity sensation is intact to light touch.     She has a limping gait.   Medical Decision Making No cranial imaging done.   Assessment and Plan: Ms. Schaffter is a pleasant 67 y.o. female had fall on 12/14/22- she fell forward and hit her head. She had initial swelling at forehead with bilateral black eyes.   Since the fall, she notes new onset of intermittent headaches that are daily. She has known migraines, but these headaches are different. No vision changes. No dizziness.   No cranial imaging done.   Treatment options discussed with patient and following plan made:   - CT of head ordered for further evaluation.  - Will do phone visit to review.  - Go to ED with any  new changes in mental status, vision changes, or dizziness.  -  Of note, I offered to evaluate patient for her cervical and lumbar spine today as well and she declined.   I spent a total of 25 minutes in face-to-face and non-face-to-face activities related to this patient's care today including review of outside records, review of imaging, review of symptoms, physical exam, discussion of differential diagnosis, discussion of treatment options, and documentation.   Thank you for involving me in the care of this patient.   Drake Leach PA-C Dept. of Neurosurgery

## 2023-01-15 ENCOUNTER — Encounter: Payer: Self-pay | Admitting: Orthopedic Surgery

## 2023-01-15 ENCOUNTER — Ambulatory Visit: Payer: Medicare Other | Admitting: Orthopedic Surgery

## 2023-01-15 VITALS — BP 122/70 | Ht 69.0 in | Wt 176.0 lb

## 2023-01-15 DIAGNOSIS — W19XXXA Unspecified fall, initial encounter: Secondary | ICD-10-CM

## 2023-01-15 DIAGNOSIS — G44309 Post-traumatic headache, unspecified, not intractable: Secondary | ICD-10-CM | POA: Diagnosis not present

## 2023-01-15 NOTE — Patient Instructions (Signed)
It was so nice to see you today. Thank you so much for coming in.    I want to get a CT of your head to look into things further. We will get this approved through your insurance and [facility] will call you to schedule the appointment.   Once I have the results back, we will call you to set up a phone visit to review them.   Please do not hesitate to call if you have any questions or concerns. You can also message me in MyChart.   If you have not heard back about the CT scan in the next week, please call the office so we can help you get it scheduled.   Drake Leach PA-C [phone]

## 2023-01-18 ENCOUNTER — Other Ambulatory Visit: Payer: Self-pay | Admitting: Psychiatry

## 2023-01-18 DIAGNOSIS — F411 Generalized anxiety disorder: Secondary | ICD-10-CM

## 2023-01-18 DIAGNOSIS — F3342 Major depressive disorder, recurrent, in full remission: Secondary | ICD-10-CM

## 2023-01-18 DIAGNOSIS — F5101 Primary insomnia: Secondary | ICD-10-CM

## 2023-01-19 ENCOUNTER — Ambulatory Visit
Payer: Medicare Other | Attending: Student in an Organized Health Care Education/Training Program | Admitting: Student in an Organized Health Care Education/Training Program

## 2023-01-19 ENCOUNTER — Encounter: Payer: Self-pay | Admitting: Student in an Organized Health Care Education/Training Program

## 2023-01-19 VITALS — BP 144/96 | HR 78 | Temp 98.0°F | Ht 69.0 in | Wt 176.0 lb

## 2023-01-19 DIAGNOSIS — M48061 Spinal stenosis, lumbar region without neurogenic claudication: Secondary | ICD-10-CM

## 2023-01-19 DIAGNOSIS — G894 Chronic pain syndrome: Secondary | ICD-10-CM | POA: Diagnosis not present

## 2023-01-19 DIAGNOSIS — G8929 Other chronic pain: Secondary | ICD-10-CM | POA: Diagnosis not present

## 2023-01-19 DIAGNOSIS — M961 Postlaminectomy syndrome, not elsewhere classified: Secondary | ICD-10-CM | POA: Diagnosis not present

## 2023-01-19 DIAGNOSIS — M5416 Radiculopathy, lumbar region: Secondary | ICD-10-CM

## 2023-01-19 DIAGNOSIS — M5030 Other cervical disc degeneration, unspecified cervical region: Secondary | ICD-10-CM | POA: Diagnosis not present

## 2023-01-19 DIAGNOSIS — M48062 Spinal stenosis, lumbar region with neurogenic claudication: Secondary | ICD-10-CM | POA: Diagnosis not present

## 2023-01-19 MED ORDER — OXYCODONE-ACETAMINOPHEN 5-325 MG PO TABS
1.0000 | ORAL_TABLET | Freq: Three times a day (TID) | ORAL | 0 refills | Status: AC | PRN
Start: 2023-03-06 — End: 2023-04-05

## 2023-01-19 MED ORDER — OXYCODONE-ACETAMINOPHEN 5-325 MG PO TABS
1.0000 | ORAL_TABLET | Freq: Three times a day (TID) | ORAL | 0 refills | Status: AC | PRN
Start: 2023-02-04 — End: 2023-03-06

## 2023-01-19 MED ORDER — GABAPENTIN 400 MG PO CAPS: 400.0000 mg | ORAL_CAPSULE | Freq: Two times a day (BID) | ORAL | 5 refills | Status: DC

## 2023-01-19 MED ORDER — OXYCODONE-ACETAMINOPHEN 5-325 MG PO TABS
1.0000 | ORAL_TABLET | Freq: Three times a day (TID) | ORAL | 0 refills | Status: DC | PRN
Start: 2023-04-05 — End: 2023-04-22

## 2023-01-19 NOTE — Progress Notes (Signed)
PROVIDER NOTE: Information contained herein reflects review and annotations entered in association with encounter. Interpretation of such information and data should be left to medically-trained personnel. Information provided to patient can be located elsewhere in the medical record under "Patient Instructions". Document created using STT-dictation technology, any transcriptional errors that may result from process are unintentional.    Patient: Gabrielle Pineda  Service Category: E/M  Provider: Edward Jolly, MD  DOB: 01-Apr-1956  DOS: 01/19/2023  Referring Provider: Burnett Corrente  MRN: [id number]  Specialty: Interventional Pain Management  PCP: [name], PA-C  Type: Established Patient  Setting: Ambulatory outpatient    Location: Office  Delivery: Face-to-face     HPI  Gabrielle Pineda, a 67 y.o. year old female, is here today because of her Lumbar radiculopathy [M54.16]. Gabrielle Pineda primary complain today is Back Pain (lower)  Pertinent problems: Gabrielle Pineda has Closed compression fracture of L5 lumbar vertebra; Cervical radiculopathy; Neuropathy; Spinal stenosis, lumbar region, with neurogenic claudication; Spondylosis of cervical region without myelopathy or radiculopathy; Cervical fusion syndrome; Chronic pain syndrome; Postlaminectomy syndrome, lumbar region; Chronic radicular lumbar pain; and Status post total hip replacement, right on their pertinent problem list. Pain Assessment: Severity of Chronic pain is reported as a 7 /10. Location: Back Lower/pain radiaities down both hip and legs. Onset: More than a month ago. Quality: Sharp, Radiating. Timing: Constant. Modifying factor(s): Meds and sitting. Vitals:  height is 5\' 9"  (1.753 m) and weight is 176 lb (79.8 kg). Her temperature is 98 F (36.7 C). Her blood pressure is 144/96 (abnormal) and her pulse is 78. Her oxygen saturation is 99%.  BMI: Estimated body mass index is 25.99 kg/m as calculated from the  following:   Height as of this encounter: 5\' 9"  (1.753 m).   Weight as of this encounter: 176 lb (79.8 kg). Last encounter: 11/10/2022. Last procedure: 12/14/2022.  Reason for encounter: both, medication management and post-procedure evaluation and assessment.   Patient had a bilateral L4 transforaminal epidural steroid injection on 12/14/2022 which unfortunately was not helpful.  She was leaving the clinic after her injection, she sustained a fall.  She had pretty significant bruising of her right forehead.  She states that she has had headaches since then.  She did see neurosurgery.  CT of her head has been ordered, she is awaiting insurance approval and scheduling.  She continues to utilize her spinal cord stimulator and continues Percocet 5 mg every 8 hours along with gabapentin 400 mg twice a day.   Monitoring: Percocet 5 mg TID  [place] PMP: PDMP reviewed during this encounter.       Pharmacotherapy: No side-effects or adverse reactions reported. Compliance: No problems identified. Effectiveness: Clinically acceptable.  Brigitte Pulse, RN  01/19/2023  8:08 AM  Sign when Signing Visit Nursing Pain Medication Assessment:  Safety precautions to be maintained throughout the outpatient stay will include: orient to surroundings, keep bed in low position, maintain call bell within reach at all times, provide assistance with transfer out of bed and ambulation.  Medication Inspection Compliance: Pill count conducted under aseptic conditions, in front of the patient. Neither the pills nor the bottle was removed from the patient's sight at any time. Once count was completed pills were immediately returned to the patient in their original bottle.  Medication: Hydrocodone/APAP Pill/Patch Count:  63 of 90 pills remain Pill/Patch Appearance: Markings consistent with prescribed medication Bottle Appearance: Standard pharmacy container. Clearly labeled. Filled Date: 5 / 20 / 2024 Last Medication  intake:   TodaySafety precautions to be maintained throughout the outpatient stay will include: orient to surroundings, keep bed in low position, maintain call bell within reach at all times, provide assistance with transfer out of bed and ambulation.     No results found for: "CBDTHCR" No results found for: "D8THCCBX" No results found for: "D9THCCBX"  UDS:  Summary  Date Value Ref Range Status  10/06/2022 Note  Final    Comment:    ==================================================================== [facility] (MW) ==================================================================== Test                             Result       Flag       Units  Drug Present and Declared for Prescription Verification   Oxycodone                      1533         EXPECTED   ng/mg creat   Oxymorphone                    581          EXPECTED   ng/mg creat   Noroxycodone                   1658         EXPECTED   ng/mg creat   Noroxymorphone                 175          EXPECTED   ng/mg creat    Sources of oxycodone are scheduled prescription medications.    Oxymorphone, noroxycodone, and noroxymorphone are expected    metabolites of oxycodone. Oxymorphone is also available as a    scheduled prescription medication.  ==================================================================== Test                      Result    Flag   Units      Ref Range   Creatinine              36               mg/dL      >=73 ==================================================================== Declared Medications:  The flagging and interpretation on this report are based on the  following declared medications.  Unexpected results may arise from  inaccuracies in the declared medications.   **Note: The testing scope of this panel includes these medications:   Oxycodone   **Note: The testing scope of this panel does not include the  following reported medications:   Acetaminophen  Acyclovir (Zovirax)  Albuterol  (Ventolin HFA)  Aripiprazole (Abilify)  Atorvastatin (Lipitor)  Budesonide (Breztri Aerosphere)  Carvedilol (Coreg)  Eye Drops  Ezetimibe (Zetia)  Formoterol (Breztri Aerosphere)  Gabapentin (Neurontin)  Glycopyrrolate (Breztri Aerosphere)  Leflunomide (Arava)  Linaclotide (Linzess)  Meloxicam (Mobic)  Mirtazapine (Remeron)  Montelukast (Singulair)  Multivitamin  Omeprazole (Prilosec)  Potassium (Klor-Con)  Topiramate (Topamax)  Valacyclovir (Valtrex)  Venlafaxine (Effexor) ==================================================================== For clinical consultation, please call [phone]. ====================================================================       ROS  Constitutional:  +headaches Gastrointestinal: No reported hemesis, hematochezia, vomiting, or acute GI distress Musculoskeletal:  neck pain and low back pain Neurological: No reported episodes of acute onset apraxia, aphasia, dysarthria, agnosia, amnesia, paralysis, loss of coordination, or loss of consciousness  Medication Review  ARIPiprazole, Budeson-Glycopyrrol-Formoterol, COVID-19 mRNA vaccine 2023-2024,  Lifitegrast, Nebulizer, Oxymetazoline HCl, acyclovir ointment, albuterol, atorvastatin, carvedilol, dextromethorphan-guaiFENesin, ezetimibe, fexofenadine, furosemide, gabapentin, ipratropium, leflunomide, linaclotide, losartan, meloxicam, methylPREDNISolone, mirtazapine, montelukast, multivitamin with minerals, omeprazole, oxyCODONE-acetaminophen, potassium chloride SA, topiramate, valACYclovir, and venlafaxine XR  History Review  Allergy: Gabrielle Pineda is allergic to sulfasalazine and plaquenil [hydroxychloroquine]. Drug: Gabrielle Pineda  reports no history of drug use. Alcohol:  reports no history of alcohol use. Tobacco:  reports that she quit smoking about 2 years ago. Her smoking use included cigarettes. She has a 90.00 pack-year smoking history. She has never used smokeless tobacco. Social: Gabrielle Pineda  reports that she quit smoking about 2 years ago. Her smoking use included cigarettes. She has a 90.00 pack-year smoking history. She has never used smokeless tobacco. She reports that she does not drink alcohol and does not use drugs. Medical:  has a past medical history of Alcohol abuse, Allergy (1983), Anemia, Anxiety, Arthritis, Cataract, Cervicalgia, Cirrhosis (HCC) (1994), COPD (chronic obstructive pulmonary disease) (HCC), Depression, Difficult intubation, Dyspnea, GERD (gastroesophageal reflux disease), Headache, Heart murmur, Hypertension, Neuromuscular disorder (HCC), Other and unspecified hyperlipidemia, Oxygen deficiency, Status post insertion of spinal cord stimulator, and Tachycardia. Surgical: Gabrielle Pineda  has a past surgical history that includes neck disc surgery; Breast enhancement surgery (1981); Esophagogastroduodenoscopy; Tonsillectomy and adenoidectomy (1973); Carpal tunnel release (11/11/2011); Carpal tunnel release (2013); Rotator cuff repair (06/16/2016); Rotator cuff repair; Colonoscopy with propofol (N/A, 11/01/2017); Augmentation mammaplasty (Bilateral, 1982); Anterior cervical decomp/discectomy fusion (2012, 2015, 2018); Lumbar laminectomy/decompression microdiscectomy (N/A, 08/15/2019); Spinal cord stimulator insertion (N/A, 03/17/2021); Total hip arthroplasty (Right, 04/15/2021); Joint replacement (2022); and Spine surgery (2020). Family: family history includes Alcohol abuse in her father, mother, sister, and sister; Anxiety disorder in her sister; Arthritis in her mother; Bipolar disorder in her mother; Cancer in her father; Depression in her sister; Drug abuse in her sister; Early death in her mother; Pancreatic cancer in her father; Suicidality in her mother.  Laboratory Chemistry Profile   Renal Lab Results  Component Value Date   BUN 11 09/11/2022   CREATININE 1.00 09/11/2022   BCR 5 (L) 11/17/2021   GFRAA 72 05/06/2020   GFRNONAA >60 09/11/2022     Hepatic Lab Results  Component Value Date   AST 22 09/11/2022   ALT 17 09/11/2022   ALBUMIN 3.7 09/11/2022   ALKPHOS 99 09/11/2022   LIPASE 63 03/23/2018    Electrolytes Lab Results  Component Value Date   NA 139 09/11/2022   K 4.1 09/11/2022   CL 109 09/11/2022   CALCIUM 8.8 (L) 09/11/2022   PHOS 3.2 05/06/2020    Bone No results found for: "VD25OH", "VD125OH2TOT", "GN5621HY8", "MV7846NG2", "25OHVITD1", "25OHVITD2", "25OHVITD3", "TESTOFREE", "TESTOSTERONE"  Inflammation (CRP: Acute Phase) (ESR: Chronic Phase) No results found for: "CRP", "ESRSEDRATE", "LATICACIDVEN"       Note: Above Lab results reviewed.  Physical Exam  General appearance: Well nourished, well developed, and well hydrated. In no apparent acute distress Mental status: Alert, oriented x 3 (person, place, & time)       Respiratory: No evidence of acute respiratory distress Eyes: PERLA Vitals: BP (!) 144/96   Pulse 78   Temp 98 F (36.7 C)   Ht 5\' 9"  (1.753 m)   Wt 176 lb (79.8 kg)   SpO2 99%   BMI 25.99 kg/m  BMI: Estimated body mass index is 25.99 kg/m as calculated from the following:   Height as of this encounter: 5\' 9"  (1.753 m).   Weight as of this encounter: 176 lb (79.8 kg). Ideal:  Ideal body weight: 66.2 kg (145 lb 15.1 oz) Adjusted ideal body weight: 71.7 kg (157 lb 15.5 oz)  Lumbar Spine Area Exam  Skin & Axial Inspection: Well healed scar from previous spine surgery detected Alignment: Symmetrical Functional ROM: Pain restricted ROM affecting both sides   Gait & Posture Assessment  Ambulation: Limited Gait: Antalgic Posture: Difficulty standing up straight, due to pain    Lower Extremity Exam      Side: Right lower extremity   Side: Left lower extremity  Stability: No instability observed           Stability: No instability observed          Skin & Extremity Inspection: Skin color, temperature, and hair growth are WNL. No peripheral edema or cyanosis. No masses, redness, swelling,  asymmetry, or associated skin lesions. No contractures.   Skin & Extremity Inspection: Skin color, temperature, and hair growth are WNL. No peripheral edema or cyanosis. No masses, redness, swelling, asymmetry, or associated skin lesions. No contractures.  Functional ROM: Pain restricted ROM for hip and knee joints Limited SLR (straight leg raise)   Functional ROM: Pain restricted ROM for hip and knee joints Limited SLR (straight leg raise)  Muscle Tone/Strength: Functionally intact. No obvious neuro-muscular anomalies detected.   Muscle Tone/Strength: Functionally intact. No obvious neuro-muscular anomalies detected.  Sensory (Neurological): Dermatomal pain pattern         Sensory (Neurological): Dermatomal pain pattern        DTR: Patellar: deferred today Achilles: deferred today Plantar: deferred today   DTR: Patellar: deferred today Achilles: deferred today Plantar: deferred today  Palpation: No palpable anomalies   Palpation: No palpable anomalies       Assessment   Diagnosis Status  1. Lumbar radiculopathy (R>L)   2. Chronic radicular lumbar pain   3. Lumbar foraminal stenosis   4. Spinal stenosis, lumbar region, with neurogenic claudication   5. Postlaminectomy syndrome, lumbar region   6. DDD (degenerative disc disease), cervical   7. Failed back surgical syndrome   8. Chronic pain syndrome    Persistent Persistent Controlled   Updated Problems: No problems updated.  Plan of Care  Problem-specific:  No problem-specific Assessment & Plan notes found for this encounter.  Gabrielle Pineda has a current medication list which includes the following long-term medication(s): albuterol, albuterol, aripiprazole, atorvastatin, carvedilol, ezetimibe, fexofenadine, furosemide, ipratropium, linzess, losartan, mirtazapine, montelukast, omeprazole, potassium chloride sa, topiramate, venlafaxine xr, and gabapentin.  Pharmacotherapy (Medications Ordered): Meds ordered this  encounter  Medications   oxyCODONE-acetaminophen (PERCOCET) 5-325 MG tablet    Sig: Take 1 tablet by mouth every 8 (eight) hours as needed for severe pain. Must last 30 days.    Dispense:  90 tablet    Refill:  0    Chronic Pain: STOP Act (Not applicable) Fill 1 day early if closed on refill date. Avoid benzodiazepines within 8 hours of opioids   oxyCODONE-acetaminophen (PERCOCET) 5-325 MG tablet    Sig: Take 1 tablet by mouth every 8 (eight) hours as needed for severe pain. Must last 30 days.    Dispense:  90 tablet    Refill:  0    Chronic Pain: STOP Act (Not applicable) Fill 1 day early if closed on refill date. Avoid benzodiazepines within 8 hours of opioids   oxyCODONE-acetaminophen (PERCOCET) 5-325 MG tablet    Sig: Take 1 tablet by mouth every 8 (eight) hours as needed for severe pain. Must last 30 days.    Dispense:  90 tablet    Refill:  0    Chronic Pain: STOP Act (Not applicable) Fill 1 day early if closed on refill date. Avoid benzodiazepines within 8 hours of opioids   gabapentin (NEURONTIN) 400 MG capsule    Sig: Take 1 capsule (400 mg total) by mouth 2 (two) times daily.    Dispense:  60 capsule    Refill:  5     Follow-up plan:   Return in about 14 weeks (around 04/27/2023) for Medication Management, in person.     Recent Visits Date Type Provider Dept  12/14/22 Procedure visit Edward Jolly, MD [facility]  11/10/22 Office Visit Edward Jolly, MD [facility]  Showing recent visits within past 90 days and meeting all other requirements Today's Visits Date Type Provider Dept  01/19/23 Office Visit Edward Jolly, MD [facility]  Showing today's visits and meeting all other requirements Future Appointments No visits were found meeting these conditions. Showing future appointments within next 90 days and meeting all other requirements  I discussed the assessment and treatment plan with the patient. The patient was provided an  opportunity to ask questions and all were answered. The patient agreed with the plan and demonstrated an understanding of the instructions.  Patient advised to call back or seek an in-person evaluation if the symptoms or condition worsens.  Duration of encounter: .  Total time on encounter, as per AMA guidelines included both the face-to-face and non-face-to-face time personally spent by the physician and/or other qualified health care professional(s) on the day of the encounter (includes time in activities that require the physician or other qualified health care professional and does not include time in activities normally performed by clinical staff). Physician's time may include the following activities when performed: Preparing to see the patient (e.g., pre-charting review of records, searching for previously ordered imaging, lab work, and nerve conduction tests) Review of prior analgesic pharmacotherapies. Reviewing PMP Interpreting ordered tests (e.g., lab work, imaging, nerve conduction tests) Performing post-procedure evaluations, including interpretation of diagnostic procedures Obtaining and/or reviewing separately obtained history Performing a medically appropriate examination and/or evaluation Counseling and educating the patient/family/caregiver Ordering medications, tests, or procedures Referring and communicating with other health care professionals (when not separately reported) Documenting clinical information in the electronic or other health record Independently interpreting results (not separately reported) and communicating results to the patient/ family/caregiver Care coordination (not separately reported)  Note by: Edward Jolly, MD Date: 01/19/2023; Time: 8:44 AM

## 2023-01-19 NOTE — Telephone Encounter (Signed)
received fax requesting a refill on the mirtazapine. pt was last seen on 4-25 next appt 7-16    DispRefillsStartEnd mirtazapine (REMERON) 15 MG tablet90 tablet02/29/2024Sig: TAKE ONE TABLET BY MOUTH EVERYDAY AT BEDTIMESent to pharmacy as: mirtazapine (REMERON) 15 MG tabletE-Prescribing Status: Receipt confirmed by pharmacy (10/15/2022  5:31 PM EST)

## 2023-01-19 NOTE — Telephone Encounter (Signed)
received fax requesting a refill on the venlafaxine. pt last seen on 4-25 next appt 7-16    Disp Refills Start End    venlafaxine XR (EFFEXOR-XR) 75 MG 24 hr capsule 270 capsule 0 10/15/2022    Sig: TAKE THREE CAPSULES BY MOUTH EVERY MORNING   Sent to pharmacy as: venlafaxine XR (EFFEXOR-XR) 75 MG 24 hr capsule   E-Prescribing Status: Receipt confirmed by pharmacy (10/15/2022  5:31 PM EST)

## 2023-01-19 NOTE — Progress Notes (Signed)
Nursing Pain Medication Assessment:  Safety precautions to be maintained throughout the outpatient stay will include: orient to surroundings, keep bed in low position, maintain call bell within reach at all times, provide assistance with transfer out of bed and ambulation.  Medication Inspection Compliance: Pill count conducted under aseptic conditions, in front of the patient. Neither the pills nor the bottle was removed from the patient's sight at any time. Once count was completed pills were immediately returned to the patient in their original bottle.  Medication: Hydrocodone/APAP Pill/Patch Count:  63 of 90 pills remain Pill/Patch Appearance: Markings consistent with prescribed medication Bottle Appearance: Standard pharmacy container. Clearly labeled. Filled Date: 5 / 20 / 2024 Last Medication intake:  TodaySafety precautions to be maintained throughout the outpatient stay will include: orient to surroundings, keep bed in low position, maintain call bell within reach at all times, provide assistance with transfer out of bed and ambulation.

## 2023-01-20 ENCOUNTER — Encounter: Payer: Self-pay | Admitting: Pulmonary Disease

## 2023-01-20 DIAGNOSIS — G4734 Idiopathic sleep related nonobstructive alveolar hypoventilation: Secondary | ICD-10-CM

## 2023-01-20 NOTE — Addendum Note (Signed)
Addended by: Bonney Leitz on: 01/20/2023 04:41 PM   Modules accepted: Orders

## 2023-01-20 NOTE — Addendum Note (Signed)
Addended by: Bonney Leitz on: 01/20/2023 04:45 PM   Modules accepted: Orders

## 2023-01-22 ENCOUNTER — Ambulatory Visit
Admission: RE | Admit: 2023-01-22 | Discharge: 2023-01-22 | Disposition: A | Discharge status: Home / Self Care | Payer: Medicare Other | Source: Ambulatory Visit | Attending: Physician Assistant | Admitting: Physician Assistant

## 2023-01-22 DIAGNOSIS — Z1231 Encounter for screening mammogram for malignant neoplasm of breast: Secondary | ICD-10-CM | POA: Insufficient documentation

## 2023-01-25 ENCOUNTER — Other Ambulatory Visit: Payer: Self-pay | Admitting: Physician Assistant

## 2023-01-25 DIAGNOSIS — R928 Other abnormal and inconclusive findings on diagnostic imaging of breast: Secondary | ICD-10-CM

## 2023-01-25 DIAGNOSIS — N6489 Other specified disorders of breast: Secondary | ICD-10-CM

## 2023-01-29 NOTE — Telephone Encounter (Signed)
I have addended my last visit note providing risk assessment for the patient's upcoming procedure.

## 2023-02-01 ENCOUNTER — Ambulatory Visit
Admission: RE | Admit: 2023-02-01 | Discharge: 2023-02-01 | Disposition: A | Discharge status: Home / Self Care | Payer: Medicare Other | Source: Ambulatory Visit | Attending: Orthopedic Surgery | Admitting: Orthopedic Surgery

## 2023-02-01 ENCOUNTER — Telehealth: Payer: Medicare Other | Admitting: Psychiatry

## 2023-02-01 DIAGNOSIS — R519 Headache, unspecified: Secondary | ICD-10-CM | POA: Diagnosis not present

## 2023-02-01 DIAGNOSIS — W19XXXA Unspecified fall, initial encounter: Secondary | ICD-10-CM | POA: Diagnosis not present

## 2023-02-01 DIAGNOSIS — G44309 Post-traumatic headache, unspecified, not intractable: Secondary | ICD-10-CM | POA: Diagnosis not present

## 2023-02-01 DIAGNOSIS — G319 Degenerative disease of nervous system, unspecified: Secondary | ICD-10-CM | POA: Diagnosis not present

## 2023-02-01 NOTE — Progress Notes (Unsigned)
I,Elyce Zollinger,acting as a Neurosurgeon for [employer], PA-C.,have documented all relevant documentation on the behalf of [name], PA-C,as directed by  [name], PA-C while in the presence of [name], PA-C.   Established patient visit   Patient: Gabrielle Pineda   DOB: [birth year]   67 y.o. Female  MRN: [id number] Visit Date: 02/02/2023  Today's healthcare provider: [name], PA-C   No chief complaint on file.  Subjective    HPI  Hypertension, follow-up  BP Readings from Last 3 Encounters:  01/19/23 (!) 144/96  01/15/23 122/70  01/05/23 (!) 140/90   Wt Readings from Last 3 Encounters:  01/19/23 176 lb (79.8 kg)  01/15/23 176 lb (79.8 kg)  01/05/23 174 lb 3.2 oz (79 kg)     She was last seen for hypertension 2 months ago.  BP at that visit was 148/88. Management since that visit includes losartan 25 mg.  She reports {excellent/good/fair/poor:19665} compliance with treatment. She {is/is not:9024} having side effects. {document side effects if present:1} She is following a {diet:21022986} diet. She {is/is not:9024} exercising. She {does/does not:200015} smoke.  Use of agents associated with hypertension: {bp agents assoc with hypertension:511::"none"}.   Outside blood pressures are {***enter patient reported home BP readings, or 'not being checked':1}. Symptoms: {Yes/No:20286} chest pain {Yes/No:20286} chest pressure  {Yes/No:20286} palpitations {Yes/No:20286} syncope  {Yes/No:20286} dyspnea {Yes/No:20286} orthopnea  {Yes/No:20286} paroxysmal nocturnal dyspnea {Yes/No:20286} lower extremity edema   Pertinent labs Lab Results  Component Value Date   CHOL 170 11/23/2022   HDL 59 11/23/2022   LDLCALC 78 11/23/2022   TRIG 196 (H) 11/23/2022   CHOLHDL 2.9 11/23/2022   Lab Results  Component Value Date   NA 139 09/11/2022   K 4.1 09/11/2022   CREATININE 1.00 09/11/2022   GFRNONAA >60 09/11/2022   GLUCOSE 110 (H) 09/11/2022   TSH  3.673 03/10/2022     The 10-year ASCVD risk score (Arnett DK, et al., 2019) is: 16.4%  ---------------------------------------------------------------------------------------------------   Medications: Outpatient Medications Prior to Visit  Medication Sig   acyclovir ointment (ZOVIRAX) 5 % :Topical Every 3 Hours as needed for lesion   albuterol (PROVENTIL) (2.5 MG/3ML) 0.083% nebulizer solution Take 3 mLs (2.5 mg total) by nebulization every 4 (four) hours as needed for wheezing or shortness of breath.   albuterol (VENTOLIN HFA) 108 (90 Base) MCG/ACT inhaler INHALE TWO PUFFS BY MOUTH INTO LUNGS every SIX hours AS NEEDED FOR WHEEZING AND/OR SHORTNESS OF BREATH   ARIPiprazole (ABILIFY) 2 MG tablet TAKE ONE TABLET BY MOUTH ONCE DAILY. stop abilify 5mg    atorvastatin (LIPITOR) 40 MG tablet TAKE ONE TABLET BY MOUTH EVERY MORNING   Budeson-Glycopyrrol-Formoterol (BREZTRI AEROSPHERE) 160-9-4.8 MCG/ACT AERO Inhale 2 puffs into the lungs in the morning and at bedtime.   carvedilol (COREG) 12.5 MG tablet TAKE ONE TABLET BY MOUTH EVERY MORNING and TAKE ONE TABLET BY MOUTH EVERYDAY AT BEDTIME   dextromethorphan-guaiFENesin (MUCINEX DM) 30-600 MG 12hr tablet Take 1 tablet by mouth every 12 (twelve) hours.   ezetimibe (ZETIA) 10 MG tablet TAKE ONE TABLET BY MOUTH EVERY MORNING   fexofenadine (ALLEGRA) 180 MG tablet Take 180 mg by mouth daily.   furosemide (LASIX) 20 MG tablet Take 1 tablet (20 mg total) by mouth daily.   gabapentin (NEURONTIN) 400 MG capsule Take 1 capsule (400 mg total) by mouth 2 (two) times daily.   ipratropium (ATROVENT) 0.03 % nasal spray Place 2 sprays into both nostrils every 12 (twelve) hours.   leflunomide (ARAVA)  20 MG tablet Take 20 mg by mouth at bedtime.   LINZESS 145 MCG CAPS capsule Take 145 mcg by mouth daily before breakfast.   losartan (COZAAR) 25 MG tablet Take 1 tablet (25 mg total) by mouth daily.   meloxicam (MOBIC) 15 MG tablet Take 15 mg by mouth at bedtime.    methylPREDNISolone (MEDROL DOSEPAK) 4 MG TBPK tablet Take as directed in the package.   mirtazapine (REMERON) 15 MG tablet TAKE ONE TABLET BY MOUTH EVERYDAY AT BEDTIME   montelukast (SINGULAIR) 10 MG tablet TAKE ONE TABLET BY MOUTH EVERYDAY AT BEDTIME   Multiple Vitamin (MULTIVITAMIN WITH MINERALS) TABS tablet Take 1 tablet by mouth daily.   Nebulizer MISC Compressor nebulizer to use with nebulized medications as instructed   omeprazole (PRILOSEC) 40 MG capsule TAKE ONE CAPSULE BY MOUTH EVERY MORNING and TAKE ONE CAPSULE BY MOUTH EVERYDAY AT BEDTIME   [START ON 02/04/2023] oxyCODONE-acetaminophen (PERCOCET) 5-325 MG tablet Take 1 tablet by mouth every 8 (eight) hours as needed for severe pain. Must last 30 days.   [START ON 03/06/2023] oxyCODONE-acetaminophen (PERCOCET) 5-325 MG tablet Take 1 tablet by mouth every 8 (eight) hours as needed for severe pain. Must last 30 days.   [START ON 04/05/2023] oxyCODONE-acetaminophen (PERCOCET) 5-325 MG tablet Take 1 tablet by mouth every 8 (eight) hours as needed for severe pain. Must last 30 days.   Oxymetazoline HCl (MUCINEX NASAL SPRAY MOISTURE NA) Place into the nose.   potassium chloride SA (KLOR-CON M) 20 MEQ tablet TAKE ONE TABLET BY MOUTH EVERY MORNING   SPIKEVAX syringe    topiramate (TOPAMAX) 25 MG tablet Take 1 tablet (25 mg total) by mouth 2 (two) times daily.   valACYclovir (VALTREX) 1000 MG tablet TAKE TWO TABLETS BY MOUTH TWICE DAILY AS NEEDED FOR 5 DAYS FOR FEVER blisters   venlafaxine XR (EFFEXOR-XR) 75 MG 24 hr capsule TAKE THREE CAPSULES BY MOUTH EVERY MORNING   XIIDRA 5 % SOLN Place 1 drop into both eyes daily.   No facility-administered medications prior to visit.    Review of Systems  {Labs  Heme  Chem  Endocrine  Serology  Results Review (optional):23779}   Objective    There were no vitals taken for this visit. {Show previous Chelsea Pedretti signs (optional):23777}  Physical Exam  ***  No results found for any visits on  02/02/23.  Assessment & Plan     ***  No follow-ups on file.      {provider attestation***:1}   [name], PA-C  [facility] [phone] (phone) [phone] (fax)  [facility]

## 2023-02-02 ENCOUNTER — Ambulatory Visit (INDEPENDENT_AMBULATORY_CARE_PROVIDER_SITE_OTHER): Payer: Medicare Other | Admitting: Physician Assistant

## 2023-02-02 ENCOUNTER — Encounter: Payer: Self-pay | Admitting: Physician Assistant

## 2023-02-02 VITALS — BP 137/84 | HR 72 | Temp 97.8°F | Resp 13 | Wt 174.5 lb

## 2023-02-02 DIAGNOSIS — D649 Anemia, unspecified: Secondary | ICD-10-CM

## 2023-02-02 DIAGNOSIS — I10 Essential (primary) hypertension: Secondary | ICD-10-CM | POA: Diagnosis not present

## 2023-02-02 DIAGNOSIS — R600 Localized edema: Secondary | ICD-10-CM

## 2023-02-02 NOTE — Assessment & Plan Note (Signed)
Managed with losartan 25 mg ; lasix 20 mg prn  Bp now in range  F/u 6 mo

## 2023-02-03 ENCOUNTER — Ambulatory Visit
Admission: RE | Admit: 2023-02-03 | Discharge: 2023-02-03 | Disposition: A | Discharge status: Home / Self Care | Payer: Medicare Other | Source: Ambulatory Visit | Attending: Physician Assistant | Admitting: Physician Assistant

## 2023-02-03 DIAGNOSIS — N6489 Other specified disorders of breast: Secondary | ICD-10-CM | POA: Insufficient documentation

## 2023-02-03 DIAGNOSIS — R928 Other abnormal and inconclusive findings on diagnostic imaging of breast: Secondary | ICD-10-CM | POA: Insufficient documentation

## 2023-02-03 DIAGNOSIS — R92322 Mammographic fibroglandular density, left breast: Secondary | ICD-10-CM | POA: Diagnosis not present

## 2023-02-05 DIAGNOSIS — J449 Chronic obstructive pulmonary disease, unspecified: Secondary | ICD-10-CM | POA: Diagnosis not present

## 2023-02-08 ENCOUNTER — Ambulatory Visit: Payer: Medicare Other | Admitting: Podiatry

## 2023-02-08 ENCOUNTER — Ambulatory Visit (INDEPENDENT_AMBULATORY_CARE_PROVIDER_SITE_OTHER): Payer: Medicare Other

## 2023-02-08 ENCOUNTER — Telehealth: Payer: Self-pay | Admitting: Orthopedic Surgery

## 2023-02-08 ENCOUNTER — Telehealth (INDEPENDENT_AMBULATORY_CARE_PROVIDER_SITE_OTHER): Payer: Medicare Other | Admitting: Podiatry

## 2023-02-08 ENCOUNTER — Encounter: Payer: Self-pay | Admitting: Podiatry

## 2023-02-08 VITALS — BP 158/79 | HR 71

## 2023-02-08 DIAGNOSIS — M79675 Pain in left toe(s): Secondary | ICD-10-CM

## 2023-02-08 DIAGNOSIS — L97529 Non-pressure chronic ulcer of other part of left foot with unspecified severity: Secondary | ICD-10-CM

## 2023-02-08 DIAGNOSIS — B351 Tinea unguium: Secondary | ICD-10-CM

## 2023-02-08 DIAGNOSIS — G629 Polyneuropathy, unspecified: Secondary | ICD-10-CM | POA: Diagnosis not present

## 2023-02-08 DIAGNOSIS — M79674 Pain in right toe(s): Secondary | ICD-10-CM | POA: Diagnosis not present

## 2023-02-08 MED ORDER — GENTAMICIN SULFATE 0.1 % EX CREA
TOPICAL_CREAM | CUTANEOUS | 0 refills | Status: DC
Start: 2023-02-08 — End: 2023-07-19

## 2023-02-08 MED ORDER — CEPHALEXIN 500 MG PO CAPS: 500.0000 mg | ORAL_CAPSULE | Freq: Three times a day (TID) | ORAL | 0 refills | Status: AC

## 2023-02-08 NOTE — Patient Instructions (Signed)
DRESSING CHANGES LEFT 4TH AND LEFT 2ND TOES:   PHARMACY SHOPPING LIST: Saline or Wound Cleanser for cleaning wound 2 x 2 inch sterile gauze for cleaning wound FABRIC DOT BAND-AIDS GENTAMICIN CREAM  A. WEAR SURGICAL SHOE  AT ALL TIMES.  B. IF PRESCRIBED ORAL ANTIBIOTICS, TAKE ALL MEDICATION AS PRESCRIBED UNTIL ALL ARE GONE.  C. IF DOCTOR HAS DESIGNATED NONWEIGHTBEARING STATUS, PLEASE ADHERE TO INSTRUCTIONS.  KEEP LEFT FOOT DRY AT ALL TIMES!!!!  CLEANSE ULCER WITH SALINE OR WOUND CLEANSER.  DAB DRY WITH GAUZE SPONGE.  APPLY A LIGHT AMOUNT OF GENTAMICIN CREAM TO BASE OF ULCER.  APPLY OUTER DRESSING AS INSTRUCTED.  WEAR SURGICAL SHOE/BOOT DAILY AT ALL TIMES. IF SUPPLIED, WEAR HEEL PROTECTORS AT ALL TIMES WHEN IN BED.  DO NOT WALK BAREFOOT!!!  IF YOU EXPERIENCE ANY FEVER, CHILLS, NIGHTSWEATS, NAUSEA OR VOMITING, ELEVATED OR LOW BLOOD SUGARS, REPORT TO EMERGENCY ROOM.  IF YOU EXPERIENCE INCREASED REDNESS, PAIN, SWELLING, DISCOLORATION, ODOR, PUS, DRAINAGE OR WARMTH OF YOUR FOOT, REPORT TO EMERGENCY ROOM.

## 2023-02-08 NOTE — Telephone Encounter (Signed)
Phoned patient to inform her left foot xray results were normal. Patient advised to take oral antibiotics and use antibiotic cream on toe ulcers as instructed until she sees Dr. Allena Katz next week. Patient related understanding and was appreciative of phone call.  1. Skin ulcer of left foot including toes, with unspecified severity (HCC)   2. Neuropathy

## 2023-02-08 NOTE — Telephone Encounter (Signed)
-----   Message from Cristin E Ray sent at 02/08/2023  9:20 AM EDT ----- Lvm x3  ----- Message ----- From: Rockey Situ Sent: 02/03/2023   8:33 AM EDT To: [facility]  Left message x2 ----- Message ----- From: Drake Leach, PA-C Sent: 02/02/2023   4:22 PM EDT To: [facility]  Please schedule phone visit with me to rewiew CT results.

## 2023-02-08 NOTE — Telephone Encounter (Signed)
MyChart message sent to patient to call us to schedule phone visit.

## 2023-02-08 NOTE — Progress Notes (Unsigned)
Subjective:  Patient ID: Gabrielle Pineda, female    DOB: 06-24-1956,  MRN: [id number]  Gabrielle Pineda presents to clinic today for: painful elongated mycotic toenails 1-5 bilaterally which are tender when wearing enclosed shoe gear. Pain is relieved with periodic professional debridement. Patient states she had a blister on her left 4th toe which popped yesterday. She also relates a lesion on the bottom of her left 2nd toe which "has been there for a while". She relates no treatment other than keeping it clean. Chief Complaint  Patient presents with   Nail Problem    "I just get my toenails clipped."    PCP is Drubel, Lillia Abed, PA-C.  Allergies  Allergen Reactions   Sulfasalazine Other (See Comments)   Plaquenil [Hydroxychloroquine] Rash   Review of Systems: Negative except as noted in the HPI.  Objective: No changes noted in today's physical examination. Vitals:   02/08/23 1329  BP: (!) 158/79  Pulse: 71    Torii Z Finstad is a pleasant 67 y.o. female in NAD. AAO x 3.  Vascular Examination: Capillary refill time <3 seconds b/l LE. Palpable pedal pulses b/l LE. Digital hair absent b/l. No pedal edema b/l. Skin temperature gradient WNL b/l. No varicosities b/l. No ischemia or gangrene noted b/l LE. No cyanosis or clubbing noted b/l LE.Marland Kitchen  Dermatological Examination: Pedal skin with normal turgor, texture and tone b/l.  No interdigital macerations b/l. Toenails 1-5 b/l thickened, discolored, dystrophic with subungual debris. There is pain on palpation to dorsal aspect of nailplates.      Wound #1 Location: dorsal PIPJ of L 4th toe There is a minimal amount of devitalized tissue present in the wound. Wound Measurement:  0.5  x 0.5 x 0.1 cm. Postdebridement Wound Measurement:Ulcer not debrided. Wound Base: Mixed Granular/Fibrotic Peri-wound: Reddened Exudate: None: wound tissue dry Blood Loss during debridement:  N/A  cc('s): Ulcer not debrided Description of tissue  removed from ulceration today:  Ulcer not debrided. Sign(s) of clinical bacterial infection: erythema   Wound #2 Location: plantar distal tip left 2nd digit There is a minimal amount of devitalized tissue present in the wound. Wound Measurement:  0.1 x 0.1 x 0.1 cm  Postdebridement Wound Measurement: Ulcer not debrided. Wound Base: Granular Peri-wound: Normal Exudate: None: wound tissue dry Blood Loss during debridement: N/A cc('s): Ulcer not debrided Description of tissue removed from ulceration today:  Ulcer not debrided. Sign(s) of clinical bacterial infection: No clinical signs of infection  Neurological Examination: Protective sensation intact with 10 gram monofilament b/l LE. Vibratory sensation intact b/l LE.   Musculoskeletal Examination: Normal muscle strength 5/5 to all lower extremity muscle groups bilaterally. HAV with bunion bilaterally and hammertoes 2-5 b/l.Marland Kitchen No pain, crepitus or joint limitation noted with ROM b/l LE.  Patient ambulates independently without assistive aids.     Latest Ref Rng & Units 11/23/2022   10:27 AM  Hemoglobin A1C  Hemoglobin-A1c 4.8 - 5.6 % 5.3    Xray findings L 4th toe: No gas in tissues left foot. Contracted digits 1-5 bilaterally. No bone erosion noted at location of ulceration L 2nd toe and L 4th toe. No foreign body evident left foot.  Assessment/Plan: 1. Pain due to onychomycosis of toenails of both feet   2. Skin ulcer of left foot including toes, with unspecified severity (HCC)   3. Neuropathy     Meds ordered this encounter  Medications   cephALEXin (KEFLEX) 500 MG capsule    Sig: Take 1 capsule (500 mg  total) by mouth 3 (three) times daily for 10 days.    Dispense:  30 capsule    Refill:  0   gentamicin cream (GARAMYCIN) 0.1 %    Sig: Apply to affected toes once daily. Cover with fabric band-aid.    Dispense:  15 g    Refill:  0   Plan: -Patient was evaluated and treated. All patient's and/or POA's questions/concerns  addressed on today's visit.  -Patient/POA/Family member educated on diagnosis and treatment plan of routine ulcer debridement/wound care.  -Ulcerations cleansed and triple antibiotic ointment applied. -Patient risk factors affecting healing of ulcer: neuropathy, foot deformity, HTN, h/o tobacco use in remission -Vanity Z Perlman given written instructions on daily wound care for left second digit and left fourth digit ulceration. -Dispensed Darco shoe for left foot. -Placed her on Keflex x 10 days as well as Gentamicin Cream for local wound care until she sees Dr. Allena Katz. -Frequency of debridements needed to achieve healing:  -Wound culture and sensitivity ordered today. -Consultations ordered today:  -Radiology ordered today: X-Ray: 3 views left foot. Toenails 1-5 debrided in length and girth without incident.  -Continue soft, supportive shoe gear daily. Report any pedal injuries to medical professional. Call office if there are any questions/concerns. -Patient/POA to call should there be question/concern in the interim.  Return in about 1 week (around 02/15/2023) to see Dr. Nicholes Rough for ulcerations left 2nd digit and left 4th toe.  Freddie Breech, DPM

## 2023-02-09 ENCOUNTER — Encounter: Payer: Self-pay | Admitting: Podiatry

## 2023-02-09 NOTE — Progress Notes (Unsigned)
   Telephone Visit- Progress Note: Referring Physician:  No referring provider defined for this encounter.  Primary Physician:  [name], PA-C  This visit was performed via telephone.  Patient location: home Provider location: office  I spent a total of 5 minutes non-face-to-face activities for this visit on the date of this encounter including review of current clinical condition and response to treatment.    Patient has given verbal consent to this telephone visits and we reviewed the limitations of a telephone visit. Patient wishes to proceed.    Chief Complaint:  review CT results.   History of Present Illness: Gabrielle Pineda is a 67 y.o. female has a history of RA, COPD, HTN, L5 compression fracture, chronic pain syndrome, history of alcohol abuse, depression.    History of cervical fusion x 2, thoracic SCS 03/17/21, and lumbar laminectomy.    She had lumbar injection with Dr. Cherylann Ratel on 12/14/22- she fell and hit her head resulting in small hematoma and abrasion per his notes.   I saw her on 01/15/23- she continued with daily intermittent headache. No visition changes. No memory issues.   CT of her head was ordered and phone visit scheduled to review it.   She is doing much better. Only with an occasional headache (she has history of migraines). She feels like she is almost back to her baseline. No vision changes, dizziness, or memory issues.    Conservative measures:  Physical therapy: no recent Multimodal medical therapy including regular antiinflammatories: mobic, medrol dose pack, percocet, neurontin  Injections:  Bilateral L4 TF ESI 12/14/22   Past Surgery:  History of cervical fusion x 2 thoracic SCS 03/17/21 Lumbar laminectomy  Exam: No exam done as this was a telephone encounter.     Imaging: CT of head dated 02/01/23:  FINDINGS: Brain: No evidence of acute infarction, hemorrhage, hydrocephalus, extra-axial collection or mass lesion/mass effect.  Atrophic changes are noted.   Vascular: No hyperdense vessel or unexpected calcification.   Skull: Normal. Negative for fracture or focal lesion.   Sinuses/Orbits: No acute finding.   Other: None.   IMPRESSION: Chronic atrophic changes without acute abnormality     Electronically Signed   By: Alcide Clever M.D.   On: 02/01/2023 21:17  I have personally reviewed the images and agree with the above interpretation.  Assessment and Plan: Gabrielle Pineda is a pleasant 67 y.o. female had fall on 12/14/22- she fell forward and hit her head.   She is doing much better. Only with an occasional headache (she has history of migraines). She feels like she is almost back to her baseline. No vision changes, dizziness, or memory issues.   CT shows no acute abnormality.   Treatment options discussed with patient and following plan made:   - Offered referral to neurology for her headaches. She declines.  - She will let me know if she gets worse. - She will f/u prn.    Drake Leach PA-C Neurosurgery

## 2023-02-10 ENCOUNTER — Ambulatory Visit (INDEPENDENT_AMBULATORY_CARE_PROVIDER_SITE_OTHER): Payer: Medicare Other | Admitting: Orthopedic Surgery

## 2023-02-10 ENCOUNTER — Encounter: Payer: Self-pay | Admitting: Orthopedic Surgery

## 2023-02-10 DIAGNOSIS — W19XXXD Unspecified fall, subsequent encounter: Secondary | ICD-10-CM

## 2023-02-10 DIAGNOSIS — G44309 Post-traumatic headache, unspecified, not intractable: Secondary | ICD-10-CM | POA: Diagnosis not present

## 2023-02-12 ENCOUNTER — Other Ambulatory Visit: Payer: Self-pay | Admitting: Podiatry

## 2023-02-12 ENCOUNTER — Other Ambulatory Visit: Payer: Self-pay | Admitting: Cardiovascular Disease

## 2023-02-12 DIAGNOSIS — I471 Supraventricular tachycardia, unspecified: Secondary | ICD-10-CM

## 2023-02-12 DIAGNOSIS — G629 Polyneuropathy, unspecified: Secondary | ICD-10-CM

## 2023-02-12 DIAGNOSIS — I10 Essential (primary) hypertension: Secondary | ICD-10-CM

## 2023-02-12 DIAGNOSIS — L97529 Non-pressure chronic ulcer of other part of left foot with unspecified severity: Secondary | ICD-10-CM

## 2023-02-14 NOTE — Telephone Encounter (Signed)
Patient needs appt with Dr. Eloy End if requesting antibiotic refills

## 2023-02-16 ENCOUNTER — Ambulatory Visit: Payer: Medicare Other | Admitting: Podiatry

## 2023-02-16 DIAGNOSIS — L97529 Non-pressure chronic ulcer of other part of left foot with unspecified severity: Secondary | ICD-10-CM

## 2023-02-16 NOTE — Progress Notes (Signed)
Subjective:  Patient ID: Gabrielle Pineda, female    DOB: 06/28/1956,  MRN: [id number]  Chief Complaint  Patient presents with   Wound Check    Wound care     67 y.o. female presents with the above complaint.  Patient presents with left second digit superficial ulceration limited to the breakdown of the skin.  She was seen by Dr. Donzetta Matters who is treating it locally.  She is here for follow-up to make sure it is healing well she has underlying hammertoe condition.  She denies any other acute complaints   Review of Systems: Negative except as noted in the HPI. Denies N/V/F/Ch.  Past Medical History:  Diagnosis Date   Alcohol abuse    Allergy 1983   Anemia    Anxiety    Arthritis    Cataract    Cervicalgia    Cirrhosis (HCC) 1994   COPD (chronic obstructive pulmonary disease) (HCC)    Depression    Difficult intubation    has plates and screws in neck   Dyspnea    GERD (gastroesophageal reflux disease)    Headache    Heart murmur    on heard when pt is lying   Hypertension    Neuromuscular disorder (HCC)    Neuropathy   Other and unspecified hyperlipidemia    Oxygen deficiency    Status post insertion of spinal cord stimulator    Tachycardia    d/t questionable anxiety happens every 5- 10 years, sees [facility]    Current Outpatient Medications:    acyclovir ointment (ZOVIRAX) 5 %, :Topical Every 3 Hours as needed for lesion, Disp: 30 g, Rfl: 1   albuterol (PROVENTIL) (2.5 MG/3ML) 0.083% nebulizer solution, Take 3 mLs (2.5 mg total) by nebulization every 4 (four) hours as needed for wheezing or shortness of breath., Disp: 225 mL, Rfl: 2   albuterol (VENTOLIN HFA) 108 (90 Base) MCG/ACT inhaler, INHALE TWO PUFFS BY MOUTH INTO LUNGS every SIX hours AS NEEDED FOR WHEEZING AND/OR SHORTNESS OF BREATH, Disp: 8.5 g, Rfl: 2   ARIPiprazole (ABILIFY) 2 MG tablet, TAKE ONE TABLET BY MOUTH ONCE DAILY. stop abilify 5mg , Disp: 30 tablet, Rfl: 1   atorvastatin (LIPITOR) 40 MG  tablet, Take 1 tablet (40 mg total) by mouth daily., Disp: 90 tablet, Rfl: 3   Budeson-Glycopyrrol-Formoterol (BREZTRI AEROSPHERE) 160-9-4.8 MCG/ACT AERO, Inhale 2 puffs into the lungs in the morning and at bedtime., Disp: 10.7 g, Rfl: 11   carvedilol (COREG) 12.5 MG tablet, TAKE ONE TABLET BY MOUTH AT BREAKFAST AND AT BEDTIME, Disp: 180 tablet, Rfl: 3   cephALEXin (KEFLEX) 500 MG capsule, Take 1 capsule (500 mg total) by mouth 3 (three) times daily for 10 days., Disp: 30 capsule, Rfl: 0   dextromethorphan-guaiFENesin (MUCINEX DM) 30-600 MG 12hr tablet, Take 1 tablet by mouth every 12 (twelve) hours. (Patient not taking: Reported on 02/08/2023), Disp: , Rfl:    ezetimibe (ZETIA) 10 MG tablet, Take 1 tablet (10 mg total) by mouth daily., Disp: 90 tablet, Rfl: 3   fexofenadine (ALLEGRA) 180 MG tablet, Take 180 mg by mouth daily., Disp: , Rfl:    furosemide (LASIX) 20 MG tablet, Take 1 tablet (20 mg total) by mouth daily., Disp: 90 tablet, Rfl: 0   gabapentin (NEURONTIN) 400 MG capsule, Take 1 capsule (400 mg total) by mouth 2 (two) times daily., Disp: 60 capsule, Rfl: 5   gentamicin cream (GARAMYCIN) 0.1 %, Apply to affected toes once daily. Cover with fabric band-aid., Disp: 15  g, Rfl: 0   ipratropium (ATROVENT) 0.03 % nasal spray, Place 2 sprays into both nostrils every 12 (twelve) hours., Disp: 30 mL, Rfl: 12   leflunomide (ARAVA) 20 MG tablet, Take 20 mg by mouth at bedtime., Disp: , Rfl:    LINZESS 145 MCG CAPS capsule, Take 145 mcg by mouth daily before breakfast., Disp: , Rfl:    losartan (COZAAR) 25 MG tablet, Take 1 tablet (25 mg total) by mouth daily., Disp: 90 tablet, Rfl: 1   meloxicam (MOBIC) 15 MG tablet, Take 15 mg by mouth at bedtime., Disp: , Rfl:    methylPREDNISolone (MEDROL DOSEPAK) 4 MG TBPK tablet, Take as directed in the package. (Patient not taking: Reported on 02/08/2023), Disp: 21 tablet, Rfl: 0   mirtazapine (REMERON) 15 MG tablet, TAKE ONE TABLET BY MOUTH EVERYDAY AT BEDTIME,  Disp: 90 tablet, Rfl: 0   montelukast (SINGULAIR) 10 MG tablet, TAKE ONE TABLET BY MOUTH EVERYDAY AT BEDTIME, Disp: 30 tablet, Rfl: 11   Multiple Vitamin (MULTIVITAMIN WITH MINERALS) TABS tablet, Take 1 tablet by mouth daily., Disp: , Rfl:    Nebulizer MISC, Compressor nebulizer to use with nebulized medications as instructed, Disp: 1 each, Rfl: 0   omeprazole (PRILOSEC) 40 MG capsule, TAKE ONE CAPSULE BY MOUTH EVERY MORNING and TAKE ONE CAPSULE BY MOUTH EVERYDAY AT BEDTIME, Disp: 180 capsule, Rfl: 1   oxyCODONE-acetaminophen (PERCOCET) 5-325 MG tablet, Take 1 tablet by mouth every 8 (eight) hours as needed for severe pain. Must last 30 days., Disp: 90 tablet, Rfl: 0   [START ON 03/06/2023] oxyCODONE-acetaminophen (PERCOCET) 5-325 MG tablet, Take 1 tablet by mouth every 8 (eight) hours as needed for severe pain. Must last 30 days., Disp: 90 tablet, Rfl: 0   [START ON 04/05/2023] oxyCODONE-acetaminophen (PERCOCET) 5-325 MG tablet, Take 1 tablet by mouth every 8 (eight) hours as needed for severe pain. Must last 30 days., Disp: 90 tablet, Rfl: 0   Oxymetazoline HCl (MUCINEX NASAL SPRAY MOISTURE NA), Place into the nose. (Patient not taking: Reported on 02/08/2023), Disp: , Rfl:    potassium chloride SA (KLOR-CON M) 20 MEQ tablet, TAKE ONE TABLET BY MOUTH EVERY MORNING, Disp: 90 tablet, Rfl: 1   SPIKEVAX syringe, , Disp: , Rfl:    topiramate (TOPAMAX) 25 MG tablet, Take 1 tablet (25 mg total) by mouth 2 (two) times daily., Disp: 60 tablet, Rfl: 11   valACYclovir (VALTREX) 1000 MG tablet, TAKE TWO TABLETS BY MOUTH TWICE DAILY AS NEEDED FOR 5 DAYS FOR FEVER blisters, Disp: 30 tablet, Rfl: 1   venlafaxine XR (EFFEXOR-XR) 75 MG 24 hr capsule, TAKE THREE CAPSULES BY MOUTH EVERY MORNING, Disp: 270 capsule, Rfl: 0   XIIDRA 5 % SOLN, Place 1 drop into both eyes daily., Disp: , Rfl:   Social History   Tobacco Use  Smoking Status Former   Packs/day: 2.00   Years: 45.00   Additional pack years: 0.00   Total  pack years: 90.00   Types: Cigarettes   Quit date: 10/28/2020   Years since quitting: 2.3  Smokeless Tobacco Never  Tobacco Comments   Ive been quit for about a year now.    Allergies  Allergen Reactions   Sulfasalazine Other (See Comments)   Plaquenil [Hydroxychloroquine] Rash   Objective:  There were no vitals filed for this visit. There is no height or weight on file to calculate BMI. Constitutional Well developed. Well nourished.  Vascular Dorsalis pedis pulses palpable bilaterally. Posterior tibial pulses palpable bilaterally. Capillary refill normal to  all digits.  No cyanosis or clubbing noted. Pedal hair growth normal.  Neurologic Normal speech. Oriented to person, place, and time. Epicritic sensation to light touch grossly present bilaterally.  Dermatologic Left second digit hammertoe contracture leading to distal tip ulceration.  This is a rigid contracture.  The ulceration is mostly reepithelialized.  No signs of infection noted.  Orthopedic: Normal joint ROM without pain or crepitus bilaterally. No visible deformities. No bony tenderness.   Radiographs: None Assessment:  No diagnosis found. Plan:  Patient was evaluated and treated and all questions answered.  Left second digit ulceration limited to the breakdown of the skin mostly reepithelialized -All questions and concerns were discussed with the patient -Continue keeping it covered with antibiotic cream and a Band-Aid for another few more weeks.  She will keep wearing the surgical shoe. -The wound has completely revealed epithelialized primarily.  If it continues to regress she will come back and see me.  No follow-ups on file.

## 2023-03-02 ENCOUNTER — Telehealth: Payer: Medicare Other | Admitting: Psychiatry

## 2023-03-02 ENCOUNTER — Encounter: Payer: Self-pay | Admitting: Psychiatry

## 2023-03-02 DIAGNOSIS — F5101 Primary insomnia: Secondary | ICD-10-CM | POA: Diagnosis not present

## 2023-03-02 DIAGNOSIS — F1021 Alcohol dependence, in remission: Secondary | ICD-10-CM | POA: Diagnosis not present

## 2023-03-02 DIAGNOSIS — F3342 Major depressive disorder, recurrent, in full remission: Secondary | ICD-10-CM

## 2023-03-02 DIAGNOSIS — F411 Generalized anxiety disorder: Secondary | ICD-10-CM | POA: Diagnosis not present

## 2023-03-02 MED ORDER — ARIPIPRAZOLE 2 MG PO TABS
1.0000 mg | ORAL_TABLET | Freq: Every day | ORAL | Status: DC
Start: 2023-03-02 — End: 2023-03-08

## 2023-03-02 NOTE — Progress Notes (Signed)
Virtual Visit via Video Note  I connected with Gabrielle Pineda on 03/02/23 at  8:30 AM EDT by a video enabled telemedicine application and verified that I am speaking with the correct person using two identifiers.  Location Provider Location : [facility] Patient Location : Home  Participants: Patient , Provider    I discussed the limitations of evaluation and management by telemedicine and the availability of in person appointments. The patient expressed understanding and agreed to proceed.    I discussed the assessment and treatment plan with the patient. The patient was provided an opportunity to ask questions and all were answered. The patient agreed with the plan and demonstrated an understanding of the instructions.   The patient was advised to call back or seek an in-person evaluation if the symptoms worsen or if the condition fails to improve as anticipated.    [facility] MD OP Progress Note  03/02/2023 8:57 AM [name]  MRN:  [id number]  Chief Complaint:  Chief Complaint  Patient presents with   Follow-up   Depression   Medication Refill   HPI: Gabrielle Pineda is a 67 year old Caucasian female, on disability, lives in [place], has a history of primary insomnia, MDD, GAD, alcohol use disorder in remission, history of back pain, history of spinal cord stimulator, paroxysmal ventricular tachycardia, hypercholesterolemia, chronic pain syndrome, GERD was evaluated by telemedicine today.  Patient today reports she is currently recovering from a left foot ulcer currently in a boot.  She continues to follow-up with podiatry.  Patient otherwise reports overall she is doing fairly well.  Reports mood symptoms are stable.  She has not noticed any worsening mood symptoms on the lower dosage of Abilify.  She is agreeable to further tapering it off.  Patient reports sleep and appetite as fair.  Patient is compliant on the mirtazapine and venlafaxine.  Denies side effects.  Patient  denies any suicidality, homicidality or perceptual disturbances.  Patient denies any other concerns today.  Visit Diagnosis:    ICD-10-CM   1. Primary insomnia  F51.01     2. MDD (major depressive disorder), recurrent, in full remission (HCC)  F33.42 ARIPiprazole (ABILIFY) 2 MG tablet    3. GAD (generalized anxiety disorder)  F41.1     4. Alcohol use disorder, moderate, in sustained remission (HCC)  F10.21       Past Psychiatric History: I have reviewed past psychiatric history from progress note on 12/26/2021.  Trials of medications like sertraline, multiple other medications-does not remember all the names.  Past Medical History:  Past Medical History:  Diagnosis Date   Alcohol abuse    Allergy 1983   Anemia    Anxiety    Arthritis    Cataract    Cervicalgia    Cirrhosis (HCC) 1994   COPD (chronic obstructive pulmonary disease) (HCC)    Depression    Difficult intubation    has plates and screws in neck   Dyspnea    GERD (gastroesophageal reflux disease)    Headache    Heart murmur    on heard when pt is lying   Hypertension    Neuromuscular disorder (HCC)    Neuropathy   Other and unspecified hyperlipidemia    Oxygen deficiency    Status post insertion of spinal cord stimulator    Tachycardia    d/t questionable anxiety happens every 5- 10 years, sees [facility]    Past Surgical History:  Procedure Laterality Date   ANTERIOR CERVICAL DECOMP/DISCECTOMY FUSION  2012, 2015, 2018   x3   AUGMENTATION MAMMAPLASTY Bilateral 1982   BREAST ENHANCEMENT SURGERY  1981   CARPAL TUNNEL RELEASE  11/11/2011   Procedure: CARPAL TUNNEL RELEASE;  Surgeon: [name], MD;  Location: [facility];  Service: Neurosurgery;  Laterality: Right;  Right Median Nerve Decompression   CARPAL TUNNEL RELEASE  2013   COLONOSCOPY WITH PROPOFOL N/A 11/01/2017   Procedure: COLONOSCOPY WITH PROPOFOL;  Surgeon: [name], MD;  [facility] END[facility]COPY;  Service: Endoscopy;   Laterality: N/A;   ESOPHAGOGASTRODUODENOSCOPY     JOINT REPLACEMENT  2022   LUMBAR LAMINECTOMY/DECOMPRESSION MICRODISCECTOMY N/A 08/15/2019   Procedure: Lumbar three to Sacral one Decompressive lumbar laminectomy;  Surgeon: [name], MD;  Location: [facility] OR;  Service: Neurosurgery;  Laterality: N/A;   neck disc surgery     plates and screws in neck x 2   ROTATOR CUFF REPAIR  06/16/2016   right shoulder    ROTATOR CUFF REPAIR     SPINAL CORD STIMULATOR INSERTION N/A 03/17/2021   Procedure: THORACIC SPINAL CORD STIMULATOR (PERCUTANEOUS) & PULSE GENERATOR PLACEMENT;  Surgeon: [name], MD;  Location: [facility] ORS;  Service: Neurosurgery;  Laterality: N/A;   SPINE SURGERY  2020   TONSILLECTOMY AND ADENOIDECTOMY  1973   TOTAL HIP ARTHROPLASTY Right 04/15/2021   Procedure: TOTAL HIP ARTHROPLASTY ANTERIOR APPROACH;  Surgeon: [name], MD;  Location: [facility] ORS;  Service: Orthopedics;  Laterality: Right;    Family Psychiatric History: I have reviewed family psychiatric history from progress note on 12/26/2021.  Family History:  Family History  Problem Relation Age of Onset   Alcohol abuse Mother    Bipolar disorder Mother    Suicidality Mother    Arthritis Mother    Early death Mother    Pancreatic cancer Father    Alcohol abuse Father    Cancer Father    Drug abuse Sister    Alcohol abuse Sister    Depression Sister    Alcohol abuse Sister    Anxiety disorder Sister    Anesthesia problems Neg Hx    Breast cancer Neg Hx     Social History: Reviewed social history from my progress note on 12/26/2021. Social History   Socioeconomic History   Marital status: Married    Spouse name: Gabrielle Pineda   Number of children: 0   Years of education: Not on file   Highest education level: Bachelor's degree (e.g., BA, AB, BS)  Occupational History   Not on file  Tobacco Use   Smoking status: Former    Current packs/day: 0.00    Average packs/day: 2.0 packs/day for 45.0 years (90.0 ttl  pk-yrs)    Types: Cigarettes    Start date: 10/29/1975    Quit date: 10/28/2020    Years since quitting: 2.3   Smokeless tobacco: Never   Tobacco comments:    [name] been quit for about a year now.  Vaping Use   Vaping status: Former   Devices: vape occasionally  Substance and Sexual Activity   Alcohol use: No    Comment: recovering alcoholic Since 1994    Drug use: Never   Sexual activity: Not Currently    Birth control/protection: Abstinence  Other Topics Concern   Not on file  Social History Narrative   Married; full time; does not get regular exercise.    Social Determinants of Health   Financial Resource Strain: Medium Risk (01/31/2023)   Overall Financial Resource Strain (CARDIA)    Difficulty of  Paying Living Expenses: Somewhat hard  Food Insecurity: No Food Insecurity (01/31/2023)   Hunger Vital Sign    Worried About Running Out of Food in the Last Year: Never true    Ran Out of Food in the Last Year: Never true  Transportation Needs: No Transportation Needs (01/31/2023)   PRAPARE - Administrator, Civil Service (Medical): No    Lack of Transportation (Non-Medical): No  Physical Activity: Inactive (01/31/2023)   Exercise Vital Sign    Days of Exercise per Week: 0 days    Minutes of Exercise per Session: 30 min  Stress: No Stress Concern Present (01/31/2023)   [employer] of Occupational Health - Occupational Stress Questionnaire    Feeling of Stress : Not at all  Social Connections: Moderately Isolated (01/31/2023)   Social Connection and Isolation Panel [NHANES]    Frequency of Communication with Friends and Family: More than three times a week    Frequency of Social Gatherings with Friends and Family: More than three times a week    Attends Religious Services: Never    Database administrator or Organizations: No    Attends Banker Meetings: Never    Marital Status: Married    Allergies:  Allergies  Allergen Reactions   Sulfasalazine  Other (See Comments)   Plaquenil [Hydroxychloroquine] Rash    Metabolic Disorder Labs: Lab Results  Component Value Date   HGBA1C 5.3 11/23/2022   No results found for: "PROLACTIN" Lab Results  Component Value Date   CHOL 170 11/23/2022   TRIG 196 (H) 11/23/2022   HDL 59 11/23/2022   CHOLHDL 2.9 11/23/2022   LDLCALC 78 11/23/2022   LDLCALC 79 11/17/2021   Lab Results  Component Value Date   TSH 3.673 03/10/2022   TSH 2.170 11/17/2021    Therapeutic Level Labs: No results found for: "LITHIUM" No results found for: "VALPROATE" No results found for: "CBMZ"  Current Medications: Current Outpatient Medications  Medication Sig Dispense Refill   acyclovir ointment (ZOVIRAX) 5 % :Topical Every 3 Hours as needed for lesion 30 g 1   albuterol (PROVENTIL) (2.5 MG/3ML) 0.083% nebulizer solution Take 3 mLs (2.5 mg total) by nebulization every 4 (four) hours as needed for wheezing or shortness of breath. 225 mL 2   albuterol (VENTOLIN HFA) 108 (90 Base) MCG/ACT inhaler INHALE TWO PUFFS BY MOUTH INTO LUNGS every SIX hours AS NEEDED FOR WHEEZING AND/OR SHORTNESS OF BREATH 8.5 g 2   atorvastatin (LIPITOR) 40 MG tablet Take 1 tablet (40 mg total) by mouth daily. 90 tablet 3   Budeson-Glycopyrrol-Formoterol (BREZTRI AEROSPHERE) 160-9-4.8 MCG/ACT AERO Inhale 2 puffs into the lungs in the morning and at bedtime. 10.7 g 11   carvedilol (COREG) 12.5 MG tablet TAKE ONE TABLET BY MOUTH AT BREAKFAST AND AT BEDTIME 180 tablet 3   ezetimibe (ZETIA) 10 MG tablet Take 1 tablet (10 mg total) by mouth daily. 90 tablet 3   fexofenadine (ALLEGRA) 180 MG tablet Take 180 mg by mouth daily.     furosemide (LASIX) 20 MG tablet Take 1 tablet (20 mg total) by mouth daily. 90 tablet 0   gabapentin (NEURONTIN) 400 MG capsule Take 1 capsule (400 mg total) by mouth 2 (two) times daily. 60 capsule 5   gentamicin cream (GARAMYCIN) 0.1 % Apply to affected toes once daily. Cover with fabric band-aid. 15 g 0    ipratropium (ATROVENT) 0.03 % nasal spray Place 2 sprays into both nostrils every 12 (twelve) hours. 30  mL 12   leflunomide (ARAVA) 20 MG tablet Take 20 mg by mouth at bedtime.     LINZESS 145 MCG CAPS capsule Take 145 mcg by mouth daily before breakfast.     losartan (COZAAR) 25 MG tablet Take 1 tablet (25 mg total) by mouth daily. 90 tablet 1   meloxicam (MOBIC) 15 MG tablet Take 15 mg by mouth at bedtime.     mirtazapine (REMERON) 15 MG tablet TAKE ONE TABLET BY MOUTH EVERYDAY AT BEDTIME 90 tablet 0   montelukast (SINGULAIR) 10 MG tablet TAKE ONE TABLET BY MOUTH EVERYDAY AT BEDTIME 30 tablet 11   Multiple Vitamin (MULTIVITAMIN WITH MINERALS) TABS tablet Take 1 tablet by mouth daily.     Nebulizer MISC Compressor nebulizer to use with nebulized medications as instructed 1 each 0   omeprazole (PRILOSEC) 40 MG capsule TAKE ONE CAPSULE BY MOUTH EVERY MORNING and TAKE ONE CAPSULE BY MOUTH EVERYDAY AT BEDTIME 180 capsule 1   oxyCODONE-acetaminophen (PERCOCET) 5-325 MG tablet Take 1 tablet by mouth every 8 (eight) hours as needed for severe pain. Must last 30 days. 90 tablet 0   [START ON 03/06/2023] oxyCODONE-acetaminophen (PERCOCET) 5-325 MG tablet Take 1 tablet by mouth every 8 (eight) hours as needed for severe pain. Must last 30 days. 90 tablet 0   [START ON 04/05/2023] oxyCODONE-acetaminophen (PERCOCET) 5-325 MG tablet Take 1 tablet by mouth every 8 (eight) hours as needed for severe pain. Must last 30 days. 90 tablet 0   potassium chloride SA (KLOR-CON M) 20 MEQ tablet TAKE ONE TABLET BY MOUTH EVERY MORNING 90 tablet 1   SPIKEVAX syringe      topiramate (TOPAMAX) 25 MG tablet Take 1 tablet (25 mg total) by mouth 2 (two) times daily. 60 tablet 11   valACYclovir (VALTREX) 1000 MG tablet TAKE TWO TABLETS BY MOUTH TWICE DAILY AS NEEDED FOR 5 DAYS FOR FEVER blisters 30 tablet 1   venlafaxine XR (EFFEXOR-XR) 75 MG 24 hr capsule TAKE THREE CAPSULES BY MOUTH EVERY MORNING 270 capsule 0   XIIDRA 5 % SOLN  Place 1 drop into both eyes daily.     ARIPiprazole (ABILIFY) 2 MG tablet Take 0.5 tablets (1 mg total) by mouth daily for 14 days.     No current facility-administered medications for this visit.     Musculoskeletal: Strength & Muscle Tone:  UTA Gait & Station:  Seated Patient leans: N/A  Psychiatric Specialty Exam: Review of Systems  Psychiatric/Behavioral: Negative.      There were no vitals taken for this visit.There is no height or weight on file to calculate BMI.  General Appearance: Fairly Groomed  Eye Contact:  Fair  Speech:  Normal Rate  Volume:  Normal  Mood:  Euthymic  Affect:  Congruent  Thought Process:  Goal Directed and Descriptions of Associations: Intact  Orientation:  Full (Time, Place, and Person)  Thought Content: Logical   Suicidal Thoughts:  No  Homicidal Thoughts:  No  Memory:  Immediate;   Fair Recent;   Fair Remote;   Fair  Judgement:  Fair  Insight:  Fair  Psychomotor Activity:  Normal  Concentration:  Concentration: Fair and Attention Span: Fair  Recall:  [employer] of Knowledge: Fair  Language: Fair  Akathisia:  No  Handed:  Right  AIMS (if indicated): not done  Assets:  Communication Skills Desire for Improvement Housing Social Support  ADL's:  Intact  Cognition: WNL  Sleep:  Fair   Screenings: AIMS  Flowsheet Row Video Visit from 08/13/2022 in [facility] Office Visit from 05/13/2022 in [facility] Office Visit from 03/31/2022 in [facility] Office Visit from 01/22/2022 in [facility] Office Visit from 12/26/2021 in [facility]  AIMS Total Score 0 0 0 0 0      GAD-7    Flowsheet Row Video Visit from 12/10/2022 in [facility] Office Visit from 05/13/2022 in [facility] Office Visit from 03/31/2022 in [facility] Office Visit from 01/22/2022 in [facility] Office Visit from 12/26/2021 in [facility]  Total GAD-7 Score 0 0 2 0 0      PHQ2-9    Flowsheet Row Office Visit from 02/02/2023 in [facility] Most recent reading at 02/02/2023  9:21 AM Pulmonary Rehab from 12/10/2022 in [facility] Most recent reading at 12/10/2022  2:13 PM Video Visit from 12/10/2022 in [facility] Most recent reading at 12/10/2022  1:10 PM Office Visit from 11/23/2022 in [facility] Most recent reading at 11/23/2022 10:02 AM Clinical Support from 11/09/2022 in [facility] Most recent reading at 11/09/2022  2:54 PM  PHQ-2 Total Score 0 0 0 0 0  PHQ-9 Total Score 1 0 0 0 --      Flowsheet Row Video Visit from 03/02/2023 in [facility] Video Visit from 12/10/2022 in [facility] Video Visit from 08/13/2022 in [facility]  C-SSRS RISK CATEGORY No Risk Low Risk No Risk        Assessment and Plan: Kamala Kolton Olivarez is a 67 year old Caucasian female, married, on disability, lives in [place], has a history of GAD, MDD, sleep problems, COPD, multiple other medical problems with recent left foot ulcer in a boot, currently reports mood wise doing fairly well, tolerating being tapered off of the Abilify.  Plan as noted below.  Plan MDD in full remission Venlafaxine 225 mg p.o. daily with breakfast Taper of Abilify further, I advised patient to take 1 mg or half tablet of the 2 mg daily for the next 2 weeks and stop using it.  If she notices any worsening mood symptoms she could go back on the Abilify 2  mg. Patient aware of atypical antipsychotic medications carries a long-term risk of side effects.   GAD-stable Venlafaxine 225 mg p.o. daily  Primary insomnia-stable Mirtazapine 15 mg p.o. nightly Continue sleep hygiene techniques  Alcohol use disorder in remission Sober since 1994.  Follow-up in clinic in 2 months or sooner if needed.    Consent: Patient/Guardian gives verbal consent for treatment and assignment of benefits for services provided during this visit. Patient/Guardian expressed understanding and agreed to proceed.   This note was generated in part or whole with voice recognition software. Voice recognition is usually quite accurate but there are transcription errors that can and very often do occur. I apologize for any typographical errors that were not detected and corrected.    Jomarie Longs, MD 03/02/2023, 8:57 AM

## 2023-03-03 ENCOUNTER — Other Ambulatory Visit: Payer: Medicare Other | Admitting: Pharmacist

## 2023-03-03 NOTE — Patient Instructions (Signed)
Goals Addressed             This Visit's Progress    Pharmacy Goals       Please watch the mail for an envelope from [facility] containing the patient assistance program application. Please complete this application and mail back to [facility] Pharmacy Technician Noreene Larsson Simcox along with a copy of your Medicare Part D prescription card and a copy of your proof of income document.  Check your blood pressure once-twice weekly, and any time you have concerning symptoms like headache, chest pain, dizziness, shortness of breath, or vision changes.   Our goal is less than 130/80.  To appropriately check your blood pressure, make sure you do the following:  1) Avoid caffeine, exercise, or tobacco products for 30 minutes before checking. Empty your bladder. 2) Sit with your back supported in a flat-backed chair. Rest your arm on something flat (arm of the chair, table, etc). 3) Sit still with your feet flat on the floor, resting, for at least 5 minutes.  4) Check your blood pressure. Take 1-2 readings.  5) Write down these readings and bring with you to any provider appointments.  Bring your home blood pressure machine with you to a provider's office for accuracy comparison at least once a year.   Make sure you take your blood pressure medications before you come to any office visit, even if you were asked to fast for labs.  Estelle Grumbles, PharmD, [facility] [phone]

## 2023-03-03 NOTE — Progress Notes (Signed)
03/03/2023 Name: Gabrielle Pineda MRN: [id number] DOB: [birth year]  Chief Complaint  Patient presents with   Medication Management   Medication Assistance    Gabrielle Pineda is a 67 y.o. year old female who presented for a telephone visit.   They were referred to the pharmacist by their PCP for assistance in managing hypertension and medication access.    Subjective:  Care Team: Primary Care Provider: [name], PA-C/Pardue, Monico Blitz, DO; Next Scheduled Visit: 08/03/2023 Rheumatology: Tracey Harries, MD; Next Scheduled Visit: 03/05/2023 Hematology: Rickard Patience, MD; Next Scheduled Visit: 03/12/2023 Pain Specialist: Edward Jolly, MD; Next Scheduled Visit: 04/22/2023 Pulmonology: Salena Saner, MD; Next Scheduled Visit: 05/10/2023 Psychiatry: Jomarie Longs, MD; Next Scheduled Visit: 05/12/2023  Medication Access/Adherence  Current Pharmacy:  [facility] - [place], [place] - [address] [place] [address] Phone: [phone] Fax: [phone]  [id number] - [address]), [place] - [address] [place] ([place]) [place] [address] Phone: [phone] Fax: [phone]  [id number] [place], [place] - [address] AT [facility] [address] [place] [address] Phone: [phone] Fax: [phone]   Patient reports affordability concerns with their medications: Yes  Patient reports access/transportation concerns to their pharmacy: No  Patient reports adherence concerns with their medications:  No    Reports takes medications from pill packaging from [facility]  Report difficulty with affording Linzess  Hypertension:  Current medications:  - Carvedilol 12.5 mg twice daily - losartan 25 mg daily - furosemide 20 mg daily as needed for swelling  Medications previously tried: diltiazem  Patient does not have an  upper arm home BP cuff; patient has a wrist monitor Denies monitoring recently  Patient denies hypotensive s/sx including dizziness, lightheadedness.   Current physical activity: Reports stays active around home/with errands most days   COPD/Allergic Rhinitis:  Current medications: - Breztri inhaler - 2 puffs in morning and 2 puffs at bedtime - albuterol nebulizer solution - 1 vial via nebulizer every 4 hours as needed for wheezing/shortness of breath - albuterol inhaler - 2 puffs every 6 hours as needed for wheezing/shortness of breath - Fexofenadine 180 mg daily - montelukast 10 mg QHS  Reports uses rescue inhaler ~ once weekly  Confirms rinsing out mouth after each use of Breztri inhaler  Current medication access support: Enrolled in patient assistance for Breztri inhaler from [employer] through 08/17/2023   Objective:  Lab Results  Component Value Date   CREATININE 1.00 09/11/2022   BUN 11 09/11/2022   NA 139 09/11/2022   K 4.1 09/11/2022   CL 109 09/11/2022   CO2 22 09/11/2022    Lab Results  Component Value Date   CHOL 170 11/23/2022   HDL 59 11/23/2022   LDLCALC 78 11/23/2022   TRIG 196 (H) 11/23/2022   CHOLHDL 2.9 11/23/2022   BP Readings from Last 3 Encounters:  02/08/23 (!) 158/79  02/02/23 137/84  01/19/23 (!) 144/96   Pulse Readings from Last 3 Encounters:  02/08/23 71  02/02/23 72  01/19/23 78     Medications Reviewed Today     Reviewed by Manuela Neptune, RPH-CPP (Pharmacist) on 03/03/23 at 1328  Med List Status: <None>   Medication Order Taking? Sig Documenting Provider Last Dose Status Informant  acyclovir ointment (ZOVIRAX) 5 % [id number] Yes :Topical Every 3 Hours as needed for lesion Drubel,  Lillia Abed, PA-C Taking Active            Med Note Ronney Asters, Saliha Salts A   Wed Mar 03, 2023  1:10 PM) Uses as needed  albuterol (PROVENTIL) (2.5 MG/3ML) 0.083% nebulizer solution [id number] Yes Take 3 mLs (2.5 mg total) by nebulization every 4 (four)  hours as needed for wheezing or shortness of breath. [name], PA-C Taking Active   albuterol (VENTOLIN HFA) 108 (90 Base) MCG/ACT inhaler [id number] Yes INHALE TWO PUFFS BY MOUTH INTO LUNGS every SIX hours AS NEEDED FOR WHEEZING AND/OR SHORTNESS OF BREATH [name], PA-C Taking Active   ARIPiprazole (ABILIFY) 2 MG tablet [id number] Yes Take 0.5 tablets (1 mg total) by mouth daily for 14 days. Jomarie Longs, MD Taking Active   atorvastatin (LIPITOR) 40 MG tablet [id number] Yes Take 1 tablet (40 mg total) by mouth daily. Antonieta Iba, MD Taking Active   Budeson-Glycopyrrol-Formoterol (BREZTRI AEROSPHERE) 160-9-4.8 MCG/ACT Sandrea Matte [id number] Yes Inhale 2 puffs into the lungs in the morning and at bedtime. Salena Saner, MD Taking Active   carvedilol (COREG) 12.5 MG tablet [id number] Yes TAKE ONE TABLET BY MOUTH AT BREAKFAST AND AT BEDTIME Antonieta Iba, MD Taking Active   ezetimibe (ZETIA) 10 MG tablet [id number] Yes Take 1 tablet (10 mg total) by mouth daily. Antonieta Iba, MD Taking Active   fexofenadine [facility]) 180 MG tablet [id number] Yes Take 180 mg by mouth daily. [provider] Taking Active   furosemide (LASIX) 20 MG tablet [id number] Yes Take 1 tablet (20 mg total) by mouth daily. [name], PA-C Taking Active   gabapentin (NEURONTIN) 400 MG capsule [id number] Yes Take 1 capsule (400 mg total) by mouth 2 (two) times daily. Edward Jolly, MD Taking Active   gentamicin cream (GARAMYCIN) 0.1 % [id number] Yes Apply to affected toes once daily. Cover with fabric band-aid. Freddie Breech, DPM Taking Active   ipratropium (ATROVENT) 0.03 % nasal spray [id number] Yes Place 2 sprays into both nostrils every 12 (twelve) hours. [name], PA-C Taking Active   leflunomide (ARAVA) 20 MG tablet [id number] Yes Take 20 mg by mouth at bedtime. [provider] Taking Active   LINZESS 145 MCG CAPS capsule [id number] Yes Take 145 mcg by mouth daily before  breakfast. [provider] Taking Active Self  losartan (COZAAR) 25 MG tablet [id number] Yes Take 1 tablet (25 mg total) by mouth daily. [name], PA-C Taking Active   meloxicam (MOBIC) 15 MG tablet [id number] Yes Take 15 mg by mouth at bedtime. [provider] Taking Active   mirtazapine (REMERON) 15 MG tablet [id number] Yes TAKE ONE TABLET BY MOUTH EVERYDAY AT BEDTIME Jomarie Longs, MD Taking Active   montelukast (SINGULAIR) 10 MG tablet [id number] Yes TAKE ONE TABLET BY MOUTH EVERYDAY AT BEDTIME [name], PA-C Taking Active   Multiple Vitamin (MULTIVITAMIN WITH MINERALS) TABS tablet [id number] Yes Take 1 tablet by mouth daily. [provider] Taking Active Self  Nebulizer MISC [id number]  Compressor nebulizer to use with nebulized medications as instructed [name], PA-C  Active   omeprazole (PRILOSEC) 40 MG capsule [id number] Yes TAKE ONE CAPSULE BY MOUTH EVERY MORNING and TAKE ONE CAPSULE BY MOUTH EVERYDAY AT BEDTIME Drubel, Lillia Abed, PA-C Taking Active   oxyCODONE-acetaminophen (PERCOCET) 5-325 MG tablet [id number] Yes Take 1 tablet by mouth every 8 (eight) hours as needed for severe pain. Must last 30 days. Edward Jolly, MD Taking Active   oxyCODONE-acetaminophen (PERCOCET) 5-325 MG tablet [id number]  Take 1 tablet by mouth every  8 (eight) hours as needed for severe pain. Must last 30 days. Edward Jolly, MD  Active   oxyCODONE-acetaminophen (PERCOCET) 5-325 MG tablet [id number]  Take 1 tablet by mouth every 8 (eight) hours as needed for severe pain. Must last 30 days. Edward Jolly, MD  Active   potassium chloride SA (KLOR-CON M) 20 MEQ tablet [id number] Yes TAKE ONE TABLET BY MOUTH EVERY MORNING [name], PA-C Taking Active   topiramate (TOPAMAX) 25 MG tablet [id number] Yes Take 1 tablet (25 mg total) by mouth 2 (two) times daily. Edward Jolly, MD Taking Active   valACYclovir (VALTREX) 1000 MG tablet [id number] Yes TAKE TWO TABLETS BY MOUTH TWICE  DAILY AS NEEDED FOR 5 DAYS FOR FEVER blisters [name], PA-C Taking Active   venlafaxine XR (EFFEXOR-XR) 75 MG 24 hr capsule [id number] Yes TAKE THREE CAPSULES BY MOUTH EVERY MORNING Jomarie Longs, MD Taking Active   XIIDRA 5 % SOLN [id number] Yes Place 1 drop into both eyes daily. [provider] Taking Active Self  Med List Note Florina Ou, RN 01/19/23 0840): UDS 10/06/22 Mr 05/05/2023              Assessment/Plan:   Comprehensive medication review performed; medication list updated in electronic medical record - Caution patient for risk of dizziness/sedation with oxycodone, gabapentin, aripiprazole, fexofenadine, mirtazapine and topiramate, particularly when taken in combination  Patient denies dizziness/sedation with these medications - patient denies difficulty with swallowing potassium chloride tablets  Patient meets criteria for patient assistance for Linzess from [place]. Will collaborate with provider and [facility] for aid to patient with applying for patient assistance program.  Hypertension: - Encourage patient to consider obtaining an upper arm monitor through her health plan OTC benefit - Recommended to check home blood pressure and heart rate and contact office if needed for readings outside of established parameters of symptoms   COPD: - Reviewed appropriate inhaler technique. - Patient to follow up with [employer] assistance program when needed for refills of Breztri inhaler   Follow Up Plan: Clinical Pharmacist will follow up with patient by telephone on 04/07/2023 at 3:00 PM   Estelle Grumbles, PharmD, [facility] [phone]

## 2023-03-07 DIAGNOSIS — J449 Chronic obstructive pulmonary disease, unspecified: Secondary | ICD-10-CM | POA: Diagnosis not present

## 2023-03-08 ENCOUNTER — Other Ambulatory Visit: Payer: Self-pay | Admitting: Psychiatry

## 2023-03-08 DIAGNOSIS — F3342 Major depressive disorder, recurrent, in full remission: Secondary | ICD-10-CM

## 2023-03-11 ENCOUNTER — Telehealth: Payer: Self-pay | Admitting: Pharmacy Technician

## 2023-03-11 DIAGNOSIS — Z5986 Financial insecurity: Secondary | ICD-10-CM

## 2023-03-11 NOTE — Progress Notes (Signed)
[facility] Customer service manager [facility])                                            [facility] Team    03/11/2023  Gabrielle Pineda September 09, 1955 [id number]                                      Medication Assistance Referral  Referral From:  [facility] PharmD Estelle Grumbles  Medication/Company: [employer] / [employer] Patient application portion:  Mailed Provider application portion: Faxed  to Dr. Jacquenette Shone Provider address/fax verified via: Office website .  [name], CPhT [place]  [facility] Office: [phone] Fax: [phone] Email: [email or web address]

## 2023-03-12 ENCOUNTER — Encounter: Payer: Self-pay | Admitting: Oncology

## 2023-03-12 ENCOUNTER — Inpatient Hospital Stay: Payer: Medicare Other | Attending: Oncology

## 2023-03-12 ENCOUNTER — Inpatient Hospital Stay (HOSPITAL_BASED_OUTPATIENT_CLINIC_OR_DEPARTMENT_OTHER): Payer: Medicare Other | Admitting: Oncology

## 2023-03-12 ENCOUNTER — Ambulatory Visit: Payer: Self-pay

## 2023-03-12 VITALS — BP 148/81 | HR 75 | Resp 18 | Ht 69.0 in | Wt 170.0 lb

## 2023-03-12 DIAGNOSIS — D539 Nutritional anemia, unspecified: Secondary | ICD-10-CM

## 2023-03-12 DIAGNOSIS — M069 Rheumatoid arthritis, unspecified: Secondary | ICD-10-CM | POA: Diagnosis not present

## 2023-03-12 DIAGNOSIS — Z79899 Other long term (current) drug therapy: Secondary | ICD-10-CM | POA: Insufficient documentation

## 2023-03-12 DIAGNOSIS — M0579 Rheumatoid arthritis with rheumatoid factor of multiple sites without organ or systems involvement: Secondary | ICD-10-CM | POA: Diagnosis not present

## 2023-03-12 LAB — CBC WITH DIFFERENTIAL/PLATELET
Abs Immature Granulocytes: 0.01 10*3/uL (ref 0.00–0.07)
Basophils Absolute: 0 10*3/uL (ref 0.0–0.1)
Basophils Relative: 1 %
Eosinophils Absolute: 0 10*3/uL (ref 0.0–0.5)
Eosinophils Relative: 0 %
HCT: 33.1 % — ABNORMAL LOW (ref 36.0–46.0)
Hemoglobin: 10 g/dL — ABNORMAL LOW (ref 12.0–15.0)
Immature Granulocytes: 0 %
Lymphocytes Relative: 28 %
Lymphs Abs: 1.4 10*3/uL (ref 0.7–4.0)
MCH: 30.2 pg (ref 26.0–34.0)
MCHC: 30.2 g/dL (ref 30.0–36.0)
MCV: 100 fL (ref 80.0–100.0)
Monocytes Absolute: 0.6 10*3/uL (ref 0.1–1.0)
Monocytes Relative: 12 %
Neutro Abs: 3 10*3/uL (ref 1.7–7.7)
Neutrophils Relative %: 59 %
Platelets: 228 10*3/uL (ref 150–400)
RBC: 3.31 MIL/uL — ABNORMAL LOW (ref 3.87–5.11)
RDW: 12 % (ref 11.5–15.5)
WBC: 5.1 10*3/uL (ref 4.0–10.5)
nRBC: 0 % (ref 0.0–0.2)

## 2023-03-12 NOTE — Assessment & Plan Note (Signed)
Currently on Uncertain and a sulfasalazine.  Chronic inflammation may contribute to her anemia.

## 2023-03-12 NOTE — Patient Outreach (Signed)
  Care Coordination   Initial Visit Note   03/12/2023 Name: DYESHA RIFF MRN: [id number] DOB: 1955/09/28  Keith Rake Brys is a 67 y.o. year old female who sees Pardue, Monico Blitz, DO for primary care. I spoke with  Brunetta Genera by phone today.  What matters to the patients health and wellness today?  CM educated patient on [facility] services.  Patient declines services and feels that she is able to manage her medical needs.  Patient agreed to contact her provider in the future if she needs [facility] services.    Goals Addressed   None     SDOH assessments and interventions completed:  No     Care Coordination Interventions:  No, not indicated   Follow up plan: No further intervention required.   Encounter Outcome:  Pt. Refused

## 2023-03-12 NOTE — Assessment & Plan Note (Signed)
Chronic normocytic anemia Labs reviewed and discussed with patient. Normal iron panel, B12 and folate, normal TSH.  Negative multiple myeloma panel, light chain ratio.  Normal LDH. Anemia could be secondary to chronic inflammation/autoimmune disease and drug side effect leflunomide, can not rule out underlying bone marrow disease, i.e. MDS. Hemoglobin has been stable.  Recommend observation.

## 2023-03-12 NOTE — Progress Notes (Signed)
Hematology/Oncology Progress note [phone] [phone]         Patient Care Team: Sherlyn Hay, DO as PCP - General (Family Medicine) Mariah Milling Tollie Pizza, MD as PCP - Cardiology (Cardiology) Jomarie Longs, MD as Consulting Physician (Psychiatry) Edward Jolly, MD as Consulting Physician (Pain Medicine) Ronney Asters Jackelyn Poling, RPH-CPP as Pharmacist   REFERRING PROVIDER: [name], PA-C  CHIEF COMPLAINTS/REASON FOR VISIT:  Anemia  ASSESSMENT & PLAN:  Macrocytic anemia Chronic normocytic anemia Labs reviewed and discussed with patient. Normal iron panel, B12 and folate, normal TSH.  Negative multiple myeloma panel, light chain ratio.  Normal LDH. Anemia could be secondary to chronic inflammation/autoimmune disease and drug side effect leflunomide, can not rule out underlying bone marrow disease, i.e. MDS. Hemoglobin has been stable.  Recommend observation.    Arthritis or polyarthritis, rheumatoid (HCC) Currently on Arava and a sulfasalazine.  Chronic inflammation may contribute to her anemia.   No orders of the defined types were placed in this encounter.  Follow-up  PRN All questions were answered. The patient knows to call the clinic with any problems, questions or concerns.  Rickard Patience, MD, PhD [facility] Hematology Oncology 03/12/2023     HISTORY OF PRESENTING ILLNESS:  Gabrielle Pineda is a  67 y.o.  female with PMH listed below who was referred to me for anemia Reviewed patient's recent labs that was done.  She was found to have abnormal CBC on 02/20/2022, she has a hemoglobin of 10, MCV 96, normal white count and platelet counts.  Normal differential.  Iron panel showed TIBC 248, ferritin 173, iron saturation 44.  Folate 15.4, vitamin B12 674. Reviewed patient's previous labs ordered by primary care physician's office, anemia is chronic onset , duration is since July 2022. No aggravating or improving factors. Patient reports feeling  fatigued. She denies recent chest pain on exertion, shortness of breath on minimal exertion, pre-syncopal episodes, or palpitations She had not noticed any recent bleeding such as epistaxis, hematuria or hematochezia.  She takes over the counter NSAID [Advil, Mobic].  Patient has autoimmune disease-rheumatoid arthritis, she is taking Arava and sulfasalazine. Her last colonoscopy was 11/01/2017. She denies any pica and eats a variety of diet.  Patient has a history of alcohol abuse and she has been sober for many years.  She had alcoholic cirrhosis diagnosed in 1994.  All recovered per patient.   INTERVAL HISTORY Gabrielle Pineda is a 67 y.o. female who has above history reviewed by me today presents for follow up visit for management of anemia Patient is currently off Sulfazalazine, due to mucositis. She is on Leflunomide for RA.  Symptoms are controlled otherwise she has no new complaints.   MEDICAL HISTORY:  Past Medical History:  Diagnosis Date   Alcohol abuse    Allergy 1983   Anemia    Anxiety    Arthritis    Cataract    Cervicalgia    Cirrhosis (HCC) 1994   COPD (chronic obstructive pulmonary disease) (HCC)    Depression    Difficult intubation    has plates and screws in neck   Dyspnea    GERD (gastroesophageal reflux disease)    Headache    Heart murmur    on heard when pt is lying   Hypertension    Neuromuscular disorder (HCC)    Neuropathy   Other and unspecified hyperlipidemia    Oxygen deficiency    Status post insertion of spinal cord stimulator    Tachycardia  d/t questionable anxiety happens every 5- 10 years, sees [facility]    SURGICAL HISTORY: Past Surgical History:  Procedure Laterality Date   ANTERIOR CERVICAL DECOMP/DISCECTOMY FUSION  2012, 2015, 2018   x3   AUGMENTATION MAMMAPLASTY Bilateral 1982   BREAST ENHANCEMENT SURGERY  1981   CARPAL TUNNEL RELEASE  11/11/2011   Procedure: CARPAL TUNNEL RELEASE;  Surgeon: [name],  MD;  Location: [facility] NEURO ORS;  Service: Neurosurgery;  Laterality: Right;  Right Median Nerve Decompression   CARPAL TUNNEL RELEASE  2013   COLONOSCOPY WITH PROPOFOL N/A 11/01/2017   Procedure: COLONOSCOPY WITH PROPOFOL;  Surgeon: [name], MD;  Location: [facility] ENDOSCOPY;  Service: Endoscopy;  Laterality: N/A;   ESOPHAGOGASTRODUODENOSCOPY     JOINT REPLACEMENT  2022   LUMBAR LAMINECTOMY/DECOMPRESSION MICRODISCECTOMY N/A 08/15/2019   Procedure: Lumbar three to Sacral one Decompressive lumbar laminectomy;  Surgeon: [name], MD;  Location: [facility] OR;  Service: Neurosurgery;  Laterality: N/A;   neck disc surgery     plates and screws in neck x 2   ROTATOR CUFF REPAIR  06/16/2016   right shoulder    ROTATOR CUFF REPAIR     SPINAL CORD STIMULATOR INSERTION N/A 03/17/2021   Procedure: THORACIC SPINAL CORD STIMULATOR (PERCUTANEOUS) & PULSE GENERATOR PLACEMENT;  Surgeon: [name], MD;  Location: [facility] ORS;  Service: Neurosurgery;  Laterality: N/A;   SPINE SURGERY  2020   TONSILLECTOMY AND ADENOIDECTOMY  1973   TOTAL HIP ARTHROPLASTY Right 04/15/2021   Procedure: TOTAL HIP ARTHROPLASTY ANTERIOR APPROACH;  Surgeon: [name], MD;  Location: [facility] ORS;  Service: Orthopedics;  Laterality: Right;    SOCIAL HISTORY: Social History   Socioeconomic History   Marital status: Married    Spouse name: jerry   Number of children: 0   Years of education: Not on file   Highest education level: Bachelor's degree (e.g., BA, AB, BS)  Occupational History   Not on file  Tobacco Use   Smoking status: Former    Current packs/day: 0.00    Average packs/day: 2.0 packs/day for 45.0 years (90.0 ttl pk-yrs)    Types: Cigarettes    Start date: 10/29/1975    Quit date: 10/28/2020    Years since quitting: 2.3   Smokeless tobacco: Never   Tobacco comments:    [name] been quit for about a year now.  Vaping Use   Vaping status: Former   Devices: vape occasionally  Substance and Sexual Activity    Alcohol use: No    Comment: recovering alcoholic Since 1994    Drug use: Never   Sexual activity: Not Currently    Birth control/protection: Abstinence  Other Topics Concern   Not on file  Social History Narrative   Married; full time; does not get regular exercise.    Social Determinants of Health   Financial Resource Strain: Medium Risk (01/31/2023)   Overall Financial Resource Strain (CARDIA)    Difficulty of Paying Living Expenses: Somewhat hard  Food Insecurity: No Food Insecurity (01/31/2023)   Hunger Vital Sign    Worried About Running Out of Food in the Last Year: Never true    Ran Out of Food in the Last Year: Never true  Transportation Needs: No Transportation Needs (01/31/2023)   PRAPARE - Administrator, Civil Service (Medical): No    Lack of Transportation (Non-Medical): No  Physical Activity: Inactive (01/31/2023)   Exercise Vital Sign    Days of Exercise per Week: 0 days  Minutes of Exercise per Session: 30 min  Stress: No Stress Concern Present (01/31/2023)   [employer] of Occupational Health - Occupational Stress Questionnaire    Feeling of Stress : Not at all  Social Connections: Moderately Isolated (01/31/2023)   Social Connection and Isolation Panel [NHANES]    Frequency of Communication with Friends and Family: More than three times a week    Frequency of Social Gatherings with Friends and Family: More than three times a week    Attends Religious Services: Never    Database administrator or Organizations: No    Attends Banker Meetings: Never    Marital Status: Married  Catering manager Violence: Not At Risk (11/09/2022)   Humiliation, Afraid, Rape, and Kick questionnaire    Fear of Current or Ex-Partner: No    Emotionally Abused: No    Physically Abused: No    Sexually Abused: No    FAMILY HISTORY: Family History  Problem Relation Age of Onset   Alcohol abuse Mother    Bipolar disorder Mother    Suicidality Mother     Arthritis Mother    Early death Mother    Pancreatic cancer Father    Alcohol abuse Father    Cancer Father    Drug abuse Sister    Alcohol abuse Sister    Depression Sister    Alcohol abuse Sister    Anxiety disorder Sister    Anesthesia problems Neg Hx    Breast cancer Neg Hx     ALLERGIES:  is allergic to sulfasalazine and plaquenil [hydroxychloroquine].  MEDICATIONS:  Current Outpatient Medications  Medication Sig Dispense Refill   acyclovir ointment (ZOVIRAX) 5 % :Topical Every 3 Hours as needed for lesion 30 g 1   albuterol (PROVENTIL) (2.5 MG/3ML) 0.083% nebulizer solution Take 3 mLs (2.5 mg total) by nebulization every 4 (four) hours as needed for wheezing or shortness of breath. 225 mL 2   albuterol (VENTOLIN HFA) 108 (90 Base) MCG/ACT inhaler INHALE TWO PUFFS BY MOUTH INTO LUNGS every SIX hours AS NEEDED FOR WHEEZING AND/OR SHORTNESS OF BREATH 8.5 g 2   ARIPiprazole (ABILIFY) 2 MG tablet Take 0.5 tablets (1 mg total) by mouth daily. 15 tablet 0   atorvastatin (LIPITOR) 40 MG tablet Take 1 tablet (40 mg total) by mouth daily. 90 tablet 3   Budeson-Glycopyrrol-Formoterol (BREZTRI AEROSPHERE) 160-9-4.8 MCG/ACT AERO Inhale 2 puffs into the lungs in the morning and at bedtime. 10.7 g 11   carvedilol (COREG) 12.5 MG tablet TAKE ONE TABLET BY MOUTH AT BREAKFAST AND AT BEDTIME 180 tablet 3   ezetimibe (ZETIA) 10 MG tablet Take 1 tablet (10 mg total) by mouth daily. 90 tablet 3   fexofenadine (ALLEGRA) 180 MG tablet Take 180 mg by mouth daily.     furosemide (LASIX) 20 MG tablet Take 1 tablet (20 mg total) by mouth daily. 90 tablet 0   gabapentin (NEURONTIN) 400 MG capsule Take 1 capsule (400 mg total) by mouth 2 (two) times daily. 60 capsule 5   gentamicin cream (GARAMYCIN) 0.1 % Apply to affected toes once daily. Cover with fabric band-aid. 15 g 0   ipratropium (ATROVENT) 0.03 % nasal spray Place 2 sprays into both nostrils every 12 (twelve) hours. 30 mL 12   leflunomide (ARAVA) 20  MG tablet Take 20 mg by mouth at bedtime.     LINZESS 145 MCG CAPS capsule Take 145 mcg by mouth daily before breakfast.     losartan (COZAAR) 25 MG  tablet Take 1 tablet (25 mg total) by mouth daily. 90 tablet 1   meloxicam (MOBIC) 15 MG tablet Take 15 mg by mouth at bedtime.     mirtazapine (REMERON) 15 MG tablet TAKE ONE TABLET BY MOUTH EVERYDAY AT BEDTIME 90 tablet 0   montelukast (SINGULAIR) 10 MG tablet TAKE ONE TABLET BY MOUTH EVERYDAY AT BEDTIME 30 tablet 11   Multiple Vitamin (MULTIVITAMIN WITH MINERALS) TABS tablet Take 1 tablet by mouth daily.     Nebulizer MISC Compressor nebulizer to use with nebulized medications as instructed 1 each 0   omeprazole (PRILOSEC) 40 MG capsule TAKE ONE CAPSULE BY MOUTH EVERY MORNING and TAKE ONE CAPSULE BY MOUTH EVERYDAY AT BEDTIME 180 capsule 1   oxyCODONE-acetaminophen (PERCOCET) 5-325 MG tablet Take 1 tablet by mouth every 8 (eight) hours as needed for severe pain. Must last 30 days. 90 tablet 0   [START ON 04/05/2023] oxyCODONE-acetaminophen (PERCOCET) 5-325 MG tablet Take 1 tablet by mouth every 8 (eight) hours as needed for severe pain. Must last 30 days. 90 tablet 0   potassium chloride SA (KLOR-CON M) 20 MEQ tablet TAKE ONE TABLET BY MOUTH EVERY MORNING 90 tablet 1   topiramate (TOPAMAX) 25 MG tablet Take 1 tablet (25 mg total) by mouth 2 (two) times daily. 60 tablet 11   valACYclovir (VALTREX) 1000 MG tablet TAKE TWO TABLETS BY MOUTH TWICE DAILY AS NEEDED FOR 5 DAYS FOR FEVER blisters 30 tablet 1   venlafaxine XR (EFFEXOR-XR) 75 MG 24 hr capsule TAKE THREE CAPSULES BY MOUTH EVERY MORNING 270 capsule 0   XIIDRA 5 % SOLN Place 1 drop into both eyes daily.     No current facility-administered medications for this visit.    Review of Systems  Constitutional:  Positive for fatigue. Negative for appetite change, chills and fever.  HENT:   Negative for hearing loss and voice change.   Eyes:  Negative for eye problems.  Respiratory:  Negative for  chest tightness and cough.   Cardiovascular:  Negative for chest pain.  Gastrointestinal:  Negative for abdominal distention, abdominal pain and blood in stool.  Endocrine: Negative for hot flashes.  Genitourinary:  Negative for difficulty urinating and frequency.   Musculoskeletal:  Positive for arthralgias.  Skin:  Negative for itching and rash.  Neurological:  Negative for extremity weakness.  Hematological:  Negative for adenopathy.  Psychiatric/Behavioral:  Negative for confusion.     PHYSICAL EXAMINATION: ECOG PERFORMANCE STATUS: 1 - Symptomatic but completely ambulatory Vitals:   03/12/23 1144  BP: (!) 148/81  Pulse: 75  Resp: 18  SpO2: 98%   Filed Weights   03/12/23 1141  Weight: 170 lb (77.1 kg)     Physical Exam Constitutional:      General: She is not in acute distress. HENT:     Head: Normocephalic and atraumatic.  Eyes:     General: No scleral icterus. Cardiovascular:     Rate and Rhythm: Normal rate and regular rhythm.     Heart sounds: Normal heart sounds.  Pulmonary:     Effort: Pulmonary effort is normal. No respiratory distress.     Breath sounds: No wheezing.  Abdominal:     General: Bowel sounds are normal. There is no distension.     Palpations: Abdomen is soft.  Musculoskeletal:        General: No deformity. Normal range of motion.     Cervical back: Normal range of motion and neck supple.  Skin:    General: Skin  is warm and dry.     Findings: No erythema or rash.  Neurological:     Mental Status: She is alert and oriented to person, place, and time. Mental status is at baseline.     Cranial Nerves: No cranial nerve deficit.     Coordination: Coordination normal.  Psychiatric:        Mood and Affect: Mood normal.      LABORATORY DATA:  I have reviewed the data as listed    Latest Ref Rng & Units 03/12/2023   11:19 AM 11/25/2022   11:52 AM 11/23/2022   10:27 AM  CBC  WBC 4.0 - 10.5 K/uL 5.1  6.0  8.7   Hemoglobin 12.0 - 15.0 g/dL  16.1  9.9  09.6   Hematocrit 36.0 - 46.0 % 33.1  31.2  30.9   Platelets 150 - 400 K/uL 228  221  244       Latest Ref Rng & Units 09/11/2022   11:42 AM 06/08/2022   12:40 PM 03/10/2022   10:11 AM  CMP  Glucose 70 - 99 mg/dL 045  89  409   BUN 8 - 23 mg/dL 11  9  11    Creatinine 0.44 - 1.00 mg/dL 8.11  9.14  7.82   Sodium 135 - 145 mmol/L 139  135  133   Potassium 3.5 - 5.1 mmol/L 4.1  3.8  3.5   Chloride 98 - 111 mmol/L 109  108  109   CO2 22 - 32 mmol/L 22  21  19    Calcium 8.9 - 10.3 mg/dL 8.8  8.1  8.6   Total Protein 6.5 - 8.1 g/dL 6.8  6.9  6.2   Total Bilirubin 0.3 - 1.2 mg/dL 0.4  0.4  0.5   Alkaline Phos 38 - 126 U/L 99  113  103   AST 15 - 41 U/L 22  37  22   ALT 0 - 44 U/L 17  28  14      Lab Results  Component Value Date   IRON 23 (L) 06/08/2022   TIBC 276 06/08/2022   FERRITIN 129 06/08/2022    RADIOGRAPHIC STUDIES: I have personally reviewed the radiological images as listed and agreed with the findings in the report. No results found.

## 2023-03-17 ENCOUNTER — Encounter: Payer: Self-pay | Admitting: Pharmacist

## 2023-03-17 NOTE — Progress Notes (Signed)
   03/17/2023  Patient ID: Gabrielle Pineda, female   DOB: [birth year], 67 y.o.   MRN: [id number]  Receive phone call today from patient requesting clarification on documents needed for application for patient assistance for Linzess from [employer]. - Questions addressed - Patient reports that she will complete application and then mail back to [facility] CPhT along with proof of income document and copy of her insurance card  Advise patient that her PCP recommends follow up with GI specialist regarding overdue colonoscopy. - Patient states that she will follow up with [facility] - Advise patient to let PCP office know if new referral is needed  Follow Up Plan: Clinical Pharmacist will follow up with patient by telephone on 04/07/2023 at 3:00 PM   Estelle Grumbles, PharmD, [facility] [phone]

## 2023-03-25 ENCOUNTER — Telehealth: Payer: Self-pay | Admitting: Pharmacy Technician

## 2023-03-25 DIAGNOSIS — Z5986 Financial insecurity: Secondary | ICD-10-CM

## 2023-03-25 NOTE — Progress Notes (Signed)
[facility])                                            [facility] Quality Pharmacy Team    03/25/2023  Gabrielle Pineda 1955-12-26 [id number]  Received both patient and provider portion(s) of patient assistance application(s) for Linzess. Faxed completed application and required documents into [employer].    [name], CPhT [place]  [facility] Office: [phone] Fax: [phone] Email: [email or web address]

## 2023-04-06 ENCOUNTER — Telehealth: Payer: Self-pay | Admitting: Pharmacy Technician

## 2023-04-06 DIAGNOSIS — Z5986 Financial insecurity: Secondary | ICD-10-CM

## 2023-04-06 NOTE — Progress Notes (Signed)
[facility])                                            [facility] Quality Pharmacy Team    04/06/2023  Gabrielle Pineda 07/17/1956 [id number]  Care coordination call placed to [employer] in regard to Linzess application.  Spoke to [place] who informs the insurance card that was submitted was not legible and needs to be refaxed for processing to continue.  Re faxed insurance card to [place] today.  [name], CPhT [place]  [facility] Office: [phone] Fax: [phone] Email: [email or web address]

## 2023-04-07 ENCOUNTER — Encounter: Payer: Self-pay | Admitting: Pharmacist

## 2023-04-07 ENCOUNTER — Other Ambulatory Visit: Payer: Medicare Other | Admitting: Pharmacist

## 2023-04-07 DIAGNOSIS — J449 Chronic obstructive pulmonary disease, unspecified: Secondary | ICD-10-CM | POA: Diagnosis not present

## 2023-04-07 NOTE — Patient Instructions (Signed)
Goals Addressed             This Visit's Progress    Pharmacy Goals       If you need to reach out to patient assistance programs regarding refills or to find out the status of your application, you can do so by calling:  [employer] at [phone]  Check your blood pressure once-twice weekly, and any time you have concerning symptoms like headache, chest pain, dizziness, shortness of breath, or vision changes.   Our goal is less than 130/80.  To appropriately check your blood pressure, make sure you do the following:  1) Avoid caffeine, exercise, or tobacco products for 30 minutes before checking. Empty your bladder. 2) Sit with your back supported in a flat-backed chair. Rest your arm on something flat (arm of the chair, table, etc). 3) Sit still with your feet flat on the floor, resting, for at least 5 minutes.  4) Check your blood pressure. Take 1-2 readings.  5) Write down these readings and bring with you to any provider appointments.  Bring your home blood pressure machine with you to a provider's office for accuracy comparison at least once a year.   Make sure you take your blood pressure medications before you come to any office visit, even if you were asked to fast for labs.  Estelle Grumbles, PharmD, [facility] [phone]

## 2023-04-07 NOTE — Progress Notes (Signed)
   04/07/2023  Patient ID: Brunetta Genera, female   DOB: 03-08-1956, 67 y.o.   MRN: [id number]  From review of chart, note [facility] CPhT Noreene Larsson [place] contacted [employer] in regard to patient's Linzess application on 8/20 and was advised that insurance card that was submitted was not legible. [facility] CPhT refaxed insurance card to [employer].  Outreach to patient today as scheduled and provide update.  Note patient scheduled for appointment with GI Specialist on 04/23/2023.   Follow up plan:   1) Clinical Pharmacist will follow up with patient by telephone on 06/30/2023 at 3 pm.  2) Will follow up with patient sooner with further update from [facility] CPhT.  Estelle Grumbles, PharmD, [facility] [phone]

## 2023-04-08 ENCOUNTER — Other Ambulatory Visit: Payer: Self-pay | Admitting: Physician Assistant

## 2023-04-08 DIAGNOSIS — I10 Essential (primary) hypertension: Secondary | ICD-10-CM

## 2023-04-08 DIAGNOSIS — K219 Gastro-esophageal reflux disease without esophagitis: Secondary | ICD-10-CM

## 2023-04-12 ENCOUNTER — Ambulatory Visit (INDEPENDENT_AMBULATORY_CARE_PROVIDER_SITE_OTHER): Payer: Medicare Other | Admitting: Podiatry

## 2023-04-12 ENCOUNTER — Telehealth: Payer: Self-pay | Admitting: Pharmacy Technician

## 2023-04-12 DIAGNOSIS — Z91199 Patient's noncompliance with other medical treatment and regimen due to unspecified reason: Secondary | ICD-10-CM

## 2023-04-12 DIAGNOSIS — Z5986 Financial insecurity: Secondary | ICD-10-CM

## 2023-04-12 NOTE — Progress Notes (Signed)
1. No-show for appointment    Patient has appt scheduled on 9.13, so this may be an error.

## 2023-04-12 NOTE — Progress Notes (Signed)
[facility])                                            [facility] Quality Pharmacy Team    04/12/2023  Gabrielle Pineda 08-14-1956 [id number]  Care coordination call placed to [employer] in regard to Linzess application.  Spoke to [place] who informs patient is APPROVED 04/09/2023-08/16/2024. Initial shipment will process automatically and deliver to patient's home. Subsequent refills will need to be called in by the patient. The number for the patient to call to re order the medication is [phone]. Patient needs to call when she has approximately 10-14 day supply remaining to avoid a delay in therapy.  [name], CPhT [place]  [facility] Office: [phone] Fax: [phone] Email: [email or web address]

## 2023-04-13 ENCOUNTER — Other Ambulatory Visit: Payer: Self-pay | Admitting: Psychiatry

## 2023-04-13 ENCOUNTER — Other Ambulatory Visit: Payer: Self-pay | Admitting: Physician Assistant

## 2023-04-13 DIAGNOSIS — F3342 Major depressive disorder, recurrent, in full remission: Secondary | ICD-10-CM

## 2023-04-13 DIAGNOSIS — F411 Generalized anxiety disorder: Secondary | ICD-10-CM

## 2023-04-13 DIAGNOSIS — F5101 Primary insomnia: Secondary | ICD-10-CM

## 2023-04-13 DIAGNOSIS — R600 Localized edema: Secondary | ICD-10-CM

## 2023-04-16 ENCOUNTER — Other Ambulatory Visit: Payer: Self-pay | Admitting: Student in an Organized Health Care Education/Training Program

## 2023-04-16 DIAGNOSIS — M47812 Spondylosis without myelopathy or radiculopathy, cervical region: Secondary | ICD-10-CM

## 2023-04-22 ENCOUNTER — Ambulatory Visit
Payer: Medicare Other | Attending: Student in an Organized Health Care Education/Training Program | Admitting: Student in an Organized Health Care Education/Training Program

## 2023-04-22 ENCOUNTER — Encounter: Payer: Self-pay | Admitting: Student in an Organized Health Care Education/Training Program

## 2023-04-22 VITALS — BP 140/81 | HR 68 | Temp 97.5°F | Ht 69.0 in | Wt 170.0 lb

## 2023-04-22 DIAGNOSIS — R413 Other amnesia: Secondary | ICD-10-CM | POA: Diagnosis not present

## 2023-04-22 DIAGNOSIS — M48062 Spinal stenosis, lumbar region with neurogenic claudication: Secondary | ICD-10-CM | POA: Diagnosis not present

## 2023-04-22 DIAGNOSIS — M5416 Radiculopathy, lumbar region: Secondary | ICD-10-CM | POA: Diagnosis not present

## 2023-04-22 DIAGNOSIS — G894 Chronic pain syndrome: Secondary | ICD-10-CM | POA: Insufficient documentation

## 2023-04-22 DIAGNOSIS — G8929 Other chronic pain: Secondary | ICD-10-CM | POA: Insufficient documentation

## 2023-04-22 MED ORDER — OXYCODONE-ACETAMINOPHEN 5-325 MG PO TABS
1.0000 | ORAL_TABLET | Freq: Two times a day (BID) | ORAL | 0 refills | Status: DC | PRN
Start: 2023-07-14 — End: 2023-04-29

## 2023-04-22 MED ORDER — OXYCODONE-ACETAMINOPHEN 5-325 MG PO TABS
1.0000 | ORAL_TABLET | Freq: Two times a day (BID) | ORAL | 0 refills | Status: DC | PRN
Start: 2023-05-15 — End: 2023-04-29

## 2023-04-22 MED ORDER — OXYCODONE-ACETAMINOPHEN 5-325 MG PO TABS
1.0000 | ORAL_TABLET | Freq: Two times a day (BID) | ORAL | 0 refills | Status: DC | PRN
Start: 2023-06-14 — End: 2023-04-29

## 2023-04-22 MED ORDER — GABAPENTIN 400 MG PO CAPS: 400.0000 mg | ORAL_CAPSULE | Freq: Every day | ORAL | 5 refills | Status: DC

## 2023-04-22 NOTE — Progress Notes (Signed)
PROVIDER NOTE: Information contained herein reflects review and annotations entered in association with encounter. Interpretation of such information and data should be left to medically-trained personnel. Information provided to patient can be located elsewhere in the medical record under "Patient Instructions". Document created using STT-dictation technology, any transcriptional errors that may result from process are unintentional.    Patient: Gabrielle Pineda  Service Category: E/M  Provider: Edward Jolly, MD  DOB: 11/19/1955  DOS: 04/22/2023  Referring Provider: Burnett Corrente  MRN: [id number]  Specialty: Interventional Pain Management  PCP: Sherlyn Hay, DO  Type: Established Patient  Setting: Ambulatory outpatient    Location: Office  Delivery: Face-to-face     HPI  Gabrielle Pineda, a 67 y.o. year old female, is here today because of her Chronic pain syndrome [G89.4]. Gabrielle Pineda primary complain today is Back Pain (lower)  Pertinent problems: Gabrielle Pineda has Closed compression fracture of L5 lumbar vertebra; Cervical radiculopathy; Neuropathy; Spinal stenosis, lumbar region, with neurogenic claudication; Spondylosis of cervical region without myelopathy or radiculopathy; Cervical fusion syndrome; Chronic pain syndrome; Postlaminectomy syndrome, lumbar region; Chronic radicular lumbar pain; and Status post total hip replacement, right on their pertinent problem list. Pain Assessment: Severity of Chronic pain is reported as a 7 /10. Location: Back Lower/radiates to both hips. Onset: More than a month ago. Quality: Throbbing. Timing: Constant. Modifying factor(s): meds. Vitals:  height is 5\' 9"  (1.753 m) and weight is 170 lb (77.1 kg). Her temperature is 97.5 F (36.4 C) (abnormal). Her blood pressure is 140/81 (abnormal) and her pulse is 68. Her oxygen saturation is 98%.  BMI: Estimated body mass index is 25.1 kg/m as calculated from the following:   Height as of this  encounter: 5\' 9"  (1.753 m).   Weight as of this encounter: 170 lb (77.1 kg). Last encounter: 01/19/2023. Last procedure: 12/14/2022.  Reason for encounter: medication management.   Having more issues with short-term memory.  She states that she is more forgetful and does not recall what family members have told her Interested in reducing gabapentin dose to see if that helps Chronic low back pain with radiation to bilateral hips  Pharmacotherapy Assessment  Analgesic:Percocet 5 mg TID-->BID PRN   Monitoring: [place] PMP: PDMP not reviewed this encounter.       Pharmacotherapy: No side-effects or adverse reactions reported. Compliance: No problems identified. Effectiveness: Clinically acceptable.  Florina Ou, RN  04/22/2023  8:49 AM  Sign when Signing Visit Safety precautions to be maintained throughout the outpatient stay will include: orient to surroundings, keep bed in low position, maintain call bell within reach at all times, provide assistance with transfer out of bed and ambulation.   Nursing Pain Medication Assessment:  Safety precautions to be maintained throughout the outpatient stay will include: orient to surroundings, keep bed in low position, maintain call bell within reach at all times, provide assistance with transfer out of bed and ambulation.  Medication Inspection Compliance: Pill count conducted under aseptic conditions, in front of the patient. Neither the pills nor the bottle was removed from the patient's sight at any time. Once count was completed pills were immediately returned to the patient in their original bottle.  Medication: Oxycodone/APAP Pill/Patch Count:  9 of 90 pills remain Pill/Patch Appearance: Markings consistent with prescribed medication Bottle Appearance: Standard pharmacy container. Clearly labeled. Filled Date: 7 / 24 / 2024 Last Medication intake:  Today    No results found for: "CBDTHCR" No results found for: "D8THCCBX" No results  found  for: "D9THCCBX"  UDS:  Summary  Date Value Ref Range Status  10/06/2022 Note  Final    Comment:    ==================================================================== [facility] (MW) ==================================================================== Test                             Result       Flag       Units  Drug Present and Declared for Prescription Verification   Oxycodone                      1533         EXPECTED   ng/mg creat   Oxymorphone                    581          EXPECTED   ng/mg creat   Noroxycodone                   1658         EXPECTED   ng/mg creat   Noroxymorphone                 175          EXPECTED   ng/mg creat    Sources of oxycodone are scheduled prescription medications.    Oxymorphone, noroxycodone, and noroxymorphone are expected    metabolites of oxycodone. Oxymorphone is also available as a    scheduled prescription medication.  ==================================================================== Test                      Result    Flag   Units      Ref Range   Creatinine              36               mg/dL      >=16 ==================================================================== Declared Medications:  The flagging and interpretation on this report are based on the  following declared medications.  Unexpected results may arise from  inaccuracies in the declared medications.   **Note: The testing scope of this panel includes these medications:   Oxycodone   **Note: The testing scope of this panel does not include the  following reported medications:   Acetaminophen  Acyclovir (Zovirax)  Albuterol (Ventolin HFA)  Aripiprazole (Abilify)  Atorvastatin (Lipitor)  Budesonide (Breztri Aerosphere)  Carvedilol (Coreg)  Eye Drops  Ezetimibe (Zetia)  Formoterol (Breztri Aerosphere)  Gabapentin (Neurontin)  Glycopyrrolate (Breztri Aerosphere)  Leflunomide (Arava)  Linaclotide (Linzess)  Meloxicam (Mobic)  Mirtazapine  (Remeron)  Montelukast (Singulair)  Multivitamin  Omeprazole (Prilosec)  Potassium (Klor-Con)  Topiramate (Topamax)  Valacyclovir (Valtrex)  Venlafaxine (Effexor) ==================================================================== For clinical consultation, please call [phone]. ====================================================================       ROS  Constitutional:  memory impairment Gastrointestinal: No reported hemesis, hematochezia, vomiting, or acute GI distress Musculoskeletal:  LBP and bilateral leg pain Neurological: No reported episodes of acute onset apraxia, aphasia, dysarthria, agnosia, amnesia, paralysis, loss of coordination, or loss of consciousness  Medication Review  ARIPiprazole, Budeson-Glycopyrrol-Formoterol, Lifitegrast, Nebulizer, acyclovir ointment, albuterol, atorvastatin, carvedilol, ezetimibe, fexofenadine, furosemide, gabapentin, gentamicin cream, ipratropium, leflunomide, linaclotide, losartan, meloxicam, mirtazapine, montelukast, multivitamin with minerals, omeprazole, oxyCODONE-acetaminophen, potassium chloride SA, topiramate, valACYclovir, and venlafaxine XR  History Review  Allergy: Gabrielle Pineda is allergic to sulfasalazine and plaquenil [hydroxychloroquine]. Drug: Gabrielle Pineda  reports no history of drug use. Alcohol:  reports no history of alcohol use. Tobacco:  reports that she quit smoking about 2 years ago. Her smoking use included cigarettes. She started smoking about 47 years ago. She has a 90 pack-year smoking history. She has never used smokeless tobacco. Social: Gabrielle Pineda  reports that she quit smoking about 2 years ago. Her smoking use included cigarettes. She started smoking about 47 years ago. She has a 90 pack-year smoking history. She has never used smokeless tobacco. She reports that she does not drink alcohol and does not use drugs. Medical:  has a past medical history of Alcohol abuse, Allergy (1983), Anemia, Anxiety,  Arthritis, Cataract, Cervicalgia, Cirrhosis (HCC) (1994), COPD (chronic obstructive pulmonary disease) (HCC), Depression, Difficult intubation, Dyspnea, GERD (gastroesophageal reflux disease), Headache, Heart murmur, Hypertension, Neuromuscular disorder (HCC), Other and unspecified hyperlipidemia, Oxygen deficiency, Status post insertion of spinal cord stimulator, and Tachycardia. Surgical: Gabrielle Pineda  has a past surgical history that includes neck disc surgery; Breast enhancement surgery (1981); Esophagogastroduodenoscopy; Tonsillectomy and adenoidectomy (1973); Carpal tunnel release (11/11/2011); Carpal tunnel release (2013); Rotator cuff repair (06/16/2016); Rotator cuff repair; Colonoscopy with propofol (N/A, 11/01/2017); Augmentation mammaplasty (Bilateral, 1982); Anterior cervical decomp/discectomy fusion (2012, 2015, 2018); Lumbar laminectomy/decompression microdiscectomy (N/A, 08/15/2019); Spinal cord stimulator insertion (N/A, 03/17/2021); Total hip arthroplasty (Right, 04/15/2021); Joint replacement (2022); and Spine surgery (2020). Family: family history includes Alcohol abuse in her father, mother, sister, and sister; Anxiety disorder in her sister; Arthritis in her mother; Bipolar disorder in her mother; Cancer in her father; Depression in her sister; Drug abuse in her sister; Early death in her mother; Pancreatic cancer in her father; Suicidality in her mother.  Laboratory Chemistry Profile   Renal Lab Results  Component Value Date   BUN 11 09/11/2022   CREATININE 1.00 09/11/2022   BCR 5 (L) 11/17/2021   GFRAA 72 05/06/2020   GFRNONAA >60 09/11/2022    Hepatic Lab Results  Component Value Date   AST 22 09/11/2022   ALT 17 09/11/2022   ALBUMIN 3.7 09/11/2022   ALKPHOS 99 09/11/2022   LIPASE 63 03/23/2018    Electrolytes Lab Results  Component Value Date   NA 139 09/11/2022   K 4.1 09/11/2022   CL 109 09/11/2022   CALCIUM 8.8 (L) 09/11/2022   PHOS 3.2 05/06/2020     Bone No results found for: "VD25OH", "VD125OH2TOT", "LK4401UU7", "OZ3664QI3", "25OHVITD1", "25OHVITD2", "25OHVITD3", "TESTOFREE", "TESTOSTERONE"  Inflammation (CRP: Acute Phase) (ESR: Chronic Phase) No results found for: "CRP", "ESRSEDRATE", "LATICACIDVEN"       Note: Above Lab results reviewed.  Recent Imaging Review  DG Foot Complete Left Please see detailed radiograph report in office note. Note: Reviewed        Physical Exam  General appearance: Well nourished, well developed, and well hydrated. In no apparent acute distress Mental status: Alert, oriented x 3 (person, place, & time)       Respiratory: No evidence of acute respiratory distress Eyes: PERLA Vitals: BP (!) 140/81 (BP Location: Right Arm, Patient Position: Sitting, Cuff Size: Normal)   Pulse 68   Temp (!) 97.5 F (36.4 C)   Ht 5\' 9"  (1.753 m)   Wt 170 lb (77.1 kg)   SpO2 98%   BMI 25.10 kg/m  BMI: Estimated body mass index is 25.1 kg/m as calculated from the following:   Height as of this encounter: 5\' 9"  (1.753 m).   Weight as of this encounter: 170 lb (77.1 kg). Ideal: Ideal body weight: 66.2 kg (145 lb 15.1 oz) Adjusted ideal  body weight: 70.6 kg (155 lb 9.1 oz)  Assessment   Diagnosis Status  1. Chronic pain syndrome   2. Memory loss or impairment   3. Spinal stenosis, lumbar region, with neurogenic claudication   4. Lumbar radiculopathy (R>L)   5. Chronic radicular lumbar pain    Persistent Persistent Persistent   Updated Problems: Problem  Memory Loss Or Impairment   Patient states that she is having more issues with recall and is having trouble with short-term memory as well.     Plan of Care  Problem-specific:  Memory loss or impairment Referral to psychology for neuropsychological testing for memory impairment and cognitive deficits Discussed reducing gabapentin and oxycodone to see if those are contributing Gabrielle Pineda has a current medication list which includes the  following long-term medication(s): albuterol, albuterol, aripiprazole, atorvastatin, carvedilol, ezetimibe, fexofenadine, furosemide, ipratropium, linzess, losartan, mirtazapine, montelukast, omeprazole, potassium chloride sa, topiramate, venlafaxine xr, and gabapentin.  Pharmacotherapy (Medications Ordered): Meds ordered this encounter  Medications   gabapentin (NEURONTIN) 400 MG capsule    Sig: Take 1 capsule (400 mg total) by mouth at bedtime.    Dispense:  30 capsule    Refill:  5   oxyCODONE-acetaminophen (PERCOCET) 5-325 MG tablet    Sig: Take 1 tablet by mouth every 12 (twelve) hours as needed for severe pain. Must last 30 days.    Dispense:  60 tablet    Refill:  0    Chronic Pain: STOP Act (Not applicable) Fill 1 day early if closed on refill date. Avoid benzodiazepines within 8 hours of opioids   oxyCODONE-acetaminophen (PERCOCET) 5-325 MG tablet    Sig: Take 1 tablet by mouth every 12 (twelve) hours as needed for severe pain. Must last 30 days.    Dispense:  60 tablet    Refill:  0    Chronic Pain: STOP Act (Not applicable) Fill 1 day early if closed on refill date. Avoid benzodiazepines within 8 hours of opioids   oxyCODONE-acetaminophen (PERCOCET) 5-325 MG tablet    Sig: Take 1 tablet by mouth every 12 (twelve) hours as needed for severe pain. Must last 30 days.    Dispense:  60 tablet    Refill:  0    Chronic Pain: STOP Act (Not applicable) Fill 1 day early if closed on refill date. Avoid benzodiazepines within 8 hours of opioids   Orders:  Orders Placed This Encounter  Procedures   Ambulatory referral to Psychology    Referral Priority:   Routine    Referral Type:   Psychiatric    Referral Reason:   Specialty Services Required    Requested Specialty:   Psychology    Number of Visits Requested:   1   Follow-up plan:   Return in about 3 months (around 07/22/2023) for MM, F2F.      Recent Visits No visits were found meeting these conditions. Showing recent visits  within past 90 days and meeting all other requirements Today's Visits Date Type Provider Dept  04/22/23 Office Visit Edward Jolly, MD [facility]  Showing today's visits and meeting all other requirements Future Appointments Date Type Provider Dept  07/20/23 Appointment Edward Jolly, MD [facility]  Showing future appointments within next 90 days and meeting all other requirements  I discussed the assessment and treatment plan with the patient. The patient was provided an opportunity to ask questions and all were answered. The patient agreed with the plan and demonstrated an understanding of the instructions.  Patient advised to call back or seek an in-person evaluation if the symptoms or condition worsens.  Duration of encounter: .  Total time on encounter, as per AMA guidelines included both the face-to-face and non-face-to-face time personally spent by the physician and/or other qualified health care professional(s) on the day of the encounter (includes time in activities that require the physician or other qualified health care professional and does not include time in activities normally performed by clinical staff). Physician's time may include the following activities when performed: Preparing to see the patient (e.g., pre-charting review of records, searching for previously ordered imaging, lab work, and nerve conduction tests) Review of prior analgesic pharmacotherapies. Reviewing PMP Interpreting ordered tests (e.g., lab work, imaging, nerve conduction tests) Performing post-procedure evaluations, including interpretation of diagnostic procedures Obtaining and/or reviewing separately obtained history Performing a medically appropriate examination and/or evaluation Counseling and educating the patient/family/caregiver Ordering medications, tests, or procedures Referring and communicating with other health care professionals (when not separately  reported) Documenting clinical information in the electronic or other health record Independently interpreting results (not separately reported) and communicating results to the patient/ family/caregiver Care coordination (not separately reported)  Note by: Edward Jolly, MD Date: 04/22/2023; Time: 11:08 AM

## 2023-04-22 NOTE — Progress Notes (Signed)
Safety precautions to be maintained throughout the outpatient stay will include: orient to surroundings, keep bed in low position, maintain call bell within reach at all times, provide assistance with transfer out of bed and ambulation.   Nursing Pain Medication Assessment:  Safety precautions to be maintained throughout the outpatient stay will include: orient to surroundings, keep bed in low position, maintain call bell within reach at all times, provide assistance with transfer out of bed and ambulation.  Medication Inspection Compliance: Pill count conducted under aseptic conditions, in front of the patient. Neither the pills nor the bottle was removed from the patient's sight at any time. Once count was completed pills were immediately returned to the patient in their original bottle.  Medication: Oxycodone/APAP Pill/Patch Count:  9 of 90 pills remain Pill/Patch Appearance: Markings consistent with prescribed medication Bottle Appearance: Standard pharmacy container. Clearly labeled. Filled Date: 7 / 24 / 2024 Last Medication intake:  Today

## 2023-04-22 NOTE — Assessment & Plan Note (Addendum)
Referral to psychology for neuropsychological testing for memory impairment and cognitive deficits Discussed reducing gabapentin and oxycodone to see if those are contributing

## 2023-04-23 DIAGNOSIS — D649 Anemia, unspecified: Secondary | ICD-10-CM | POA: Diagnosis not present

## 2023-04-23 DIAGNOSIS — Z800 Family history of malignant neoplasm of digestive organs: Secondary | ICD-10-CM | POA: Diagnosis not present

## 2023-04-23 DIAGNOSIS — D509 Iron deficiency anemia, unspecified: Secondary | ICD-10-CM | POA: Diagnosis not present

## 2023-04-23 DIAGNOSIS — K219 Gastro-esophageal reflux disease without esophagitis: Secondary | ICD-10-CM | POA: Diagnosis not present

## 2023-04-23 DIAGNOSIS — K8689 Other specified diseases of pancreas: Secondary | ICD-10-CM | POA: Diagnosis not present

## 2023-04-23 DIAGNOSIS — Z86010 Personal history of colonic polyps: Secondary | ICD-10-CM | POA: Diagnosis not present

## 2023-04-23 DIAGNOSIS — R748 Abnormal levels of other serum enzymes: Secondary | ICD-10-CM | POA: Diagnosis not present

## 2023-04-26 ENCOUNTER — Telehealth: Payer: Self-pay

## 2023-04-29 ENCOUNTER — Telehealth: Payer: Self-pay | Admitting: Student in an Organized Health Care Education/Training Program

## 2023-04-29 ENCOUNTER — Other Ambulatory Visit: Payer: Self-pay | Admitting: *Deleted

## 2023-04-29 ENCOUNTER — Encounter: Payer: Self-pay | Admitting: Student in an Organized Health Care Education/Training Program

## 2023-04-29 DIAGNOSIS — G894 Chronic pain syndrome: Secondary | ICD-10-CM

## 2023-04-29 MED ORDER — OXYCODONE-ACETAMINOPHEN 5-325 MG PO TABS
1.0000 | ORAL_TABLET | Freq: Two times a day (BID) | ORAL | 0 refills | Status: DC | PRN
Start: 2023-06-28 — End: 2023-06-16

## 2023-04-29 MED ORDER — OXYCODONE-ACETAMINOPHEN 5-325 MG PO TABS
1.0000 | ORAL_TABLET | Freq: Two times a day (BID) | ORAL | 0 refills | Status: AC | PRN
Start: 2023-04-29 — End: 2023-05-29

## 2023-04-29 MED ORDER — OXYCODONE-ACETAMINOPHEN 5-325 MG PO TABS
1.0000 | ORAL_TABLET | Freq: Two times a day (BID) | ORAL | 0 refills | Status: DC | PRN
Start: 2023-05-29 — End: 2023-06-16

## 2023-04-29 NOTE — Telephone Encounter (Signed)
Sent to [facility]

## 2023-04-29 NOTE — Telephone Encounter (Signed)
PT called states that her current pharmacy closed down. Will like for her prescription to be send to [facility]. PT also states that she only had a couple of pills left and wanted to see if she can get her medication for oxycodone filled early. Please give patient a call. TY

## 2023-04-30 ENCOUNTER — Ambulatory Visit: Payer: Medicare Other | Admitting: Podiatry

## 2023-04-30 ENCOUNTER — Encounter: Payer: Self-pay | Admitting: Podiatry

## 2023-04-30 VITALS — BP 137/78 | HR 70

## 2023-04-30 DIAGNOSIS — L84 Corns and callosities: Secondary | ICD-10-CM

## 2023-04-30 DIAGNOSIS — M79674 Pain in right toe(s): Secondary | ICD-10-CM

## 2023-04-30 DIAGNOSIS — M79675 Pain in left toe(s): Secondary | ICD-10-CM

## 2023-04-30 DIAGNOSIS — B351 Tinea unguium: Secondary | ICD-10-CM

## 2023-04-30 DIAGNOSIS — G629 Polyneuropathy, unspecified: Secondary | ICD-10-CM

## 2023-04-30 NOTE — Progress Notes (Unsigned)
Subjective:  Patient ID: JINNIE CHAFFER, female    DOB: 05/04/1956,  MRN: [id number]  Aariona Z Virgilio presents to clinic today for {jgcomplaint:23593}  Chief Complaint  Patient presents with   Nail Problem    "Just trim my nails."   New problem(s): None. {jgcomplaint:23593}  PCP is Sherlyn Hay, DO.  Allergies  Allergen Reactions   Sulfasalazine Other (See Comments)   Plaquenil [Hydroxychloroquine] Rash    Review of Systems: Negative except as noted in the HPI.  Objective: No changes noted in today's physical examination. Vitals:   04/30/23 0858  BP: 137/78  Pulse: 70   Dlisa Z Urzua is a pleasant 67 y.o. female {jgbodyhabitus:24098} AAO x 3.  Assessment/Plan: 1. Pain due to onychomycosis of toenails of both feet   2. Clavi   3. Neuropathy     No orders of the defined types were placed in this encounter.   None {Jgplan:23602::"-Patient/POA to call should there be question/concern in the interim."}   Return in about 3 months (around 07/30/2023).  Freddie Breech, DPM

## 2023-05-05 ENCOUNTER — Other Ambulatory Visit: Payer: Self-pay | Admitting: Physician Assistant

## 2023-05-05 DIAGNOSIS — J209 Acute bronchitis, unspecified: Secondary | ICD-10-CM

## 2023-05-06 NOTE — Telephone Encounter (Signed)
Insurance requiring PT. Patient is open to doing PT

## 2023-05-07 ENCOUNTER — Other Ambulatory Visit: Payer: Self-pay | Admitting: Physician Assistant

## 2023-05-07 DIAGNOSIS — R339 Retention of urine, unspecified: Secondary | ICD-10-CM

## 2023-05-10 ENCOUNTER — Ambulatory Visit: Payer: Medicare Other | Admitting: Pulmonary Disease

## 2023-05-10 ENCOUNTER — Encounter: Payer: Self-pay | Admitting: Pulmonary Disease

## 2023-05-10 VITALS — BP 110/72 | HR 72 | Temp 98.6°F | Ht 69.0 in | Wt 168.2 lb

## 2023-05-10 DIAGNOSIS — G4736 Sleep related hypoventilation in conditions classified elsewhere: Secondary | ICD-10-CM

## 2023-05-10 DIAGNOSIS — J449 Chronic obstructive pulmonary disease, unspecified: Secondary | ICD-10-CM | POA: Diagnosis not present

## 2023-05-10 DIAGNOSIS — R011 Cardiac murmur, unspecified: Secondary | ICD-10-CM

## 2023-05-10 DIAGNOSIS — J4489 Other specified chronic obstructive pulmonary disease: Secondary | ICD-10-CM | POA: Diagnosis not present

## 2023-05-10 DIAGNOSIS — R0602 Shortness of breath: Secondary | ICD-10-CM | POA: Diagnosis not present

## 2023-05-10 NOTE — Progress Notes (Signed)
Subjective:    Patient ID: Gabrielle Pineda, female    DOB: 12/03/1955, 67 y.o.   MRN: [id number]  Patient Care Team: Sherlyn Hay, DO as PCP - General (Family Medicine) Antonieta Iba, MD as PCP - Cardiology (Cardiology) Jomarie Longs, MD as Consulting Physician (Psychiatry) Edward Jolly, MD as Consulting Physician (Pain Medicine) Ronney Asters Jackelyn Poling, RPH-CPP as Pharmacist  Chief Complaint  Patient presents with   Follow-up    No SOB or wheezing. Cough with clear sputum.     HPI This is a 67 year old female, former smoker quit in 2022 (80 PY), initially seen for pulmonary consult December 11, 2021 for shortness of breath and COPD, abnormal CT chest with lung nodularity.  She has rheumatoid arthritis and is maintained on [place].  She presents today for follow-up.  Last seen here on 05 Jan 2023 by me.  This is a scheduled visit.  She quit smoking over a year ago.  She has maintained on Breztri for COPD issues.  She uses albuterol but usually requires it only once or twice a week.  She was enrolled in pulmonary rehabilitation and completed the program and feels that that helped her tremendously.  She is keeping up with the exercises recommended during pulmonary rehab.  She does note seasonal variation with her symptoms however so far has done well this season.  She is compliant with Breztri 2 puffs twice a day.  She has not had any fevers, chills or sweats.  Slight cough with clear sputum production, usually in the mornings and self-limiting. She was referred to lung cancer screening program and will have her first scan in this program on November 2023.  She is on oxygen at 2 L/min nocturnally, she is compliant with therapy and notes benefit of the same.   She does not endorse any other symptomatology today.  She has chronic use of accessories that appear less pronounced on today's visit.  Overall she feels well and looks well.   THERAPIES 07/27/2022 through 12/30/2022: Pulmonary  rehab.  Patient experienced significant improvement in shortness of breath.  DATA 07/29/2017 simple spirometry: Performed for disability determination FEV1 2.26 L or 79% predicted FVC 3.05 L or 86% predicted FEV1/FVC 74% 11/01/2020 [facility]): Normal left ventricle, LVEF over 55%, right ventricle normal in size with normal systolic function, mild tricuspid regurgitation, no pulmonary hypertension, no wall motion abnormalities 12/18/2021 CT high-res chest: No evidence of fibrotic interstitial lung disease, moderate emphysema and diffuse bronchial wall thickening, mild tracheobronchomalacia, irregular nodules of the left upper lobe and dependent right lower lobe measuring 8 mm, nonspecific.  Fine centrilobular nodularity concentrated in the lung bases consistent with smoking-related respiratory bronchiolitis, coronary artery disease 02/04/2022 PFTs: FEV1 1.45 L or 49% predicted, FVC 2.24 L or 59% predicted, FEV1/FVC 65%, lung volumes normal with modest air trapping,moderately to severe diffusion defect.  Flow volume loop consistent with airway obstruction, no evidence of fixed or variable upper airway obstruction.  There is significant decline in lung function when compared to the 2018 simple spirometry treatments 02/13/2022 overnight oximetry: Show desaturations of less than 88% for over 2 hours patient qualified for nocturnal oxygen 06/29/2022 CT chest: Previously noted left upper lobe and posterior right lower lobe pulmonary nodules resolved.  Tiny 3 mm anterior upper lobe pulmonary nodule suggest follow-up CT chest in 12 to 18 months.  Moderate centrilobular emphysema with mild diffuse bronchial wall thickening compatible with COPD  Review of Systems A 10 point review of systems was performed and  it is as noted above otherwise negative.   Patient Active Problem List   Diagnosis Date Noted   Memory loss or impairment 04/22/2023   Localized edema 11/23/2022   MDD (major depressive disorder),  recurrent, in full remission (HCC) 03/31/2022   Macrocytic anemia 02/20/2022   Overweight (BMI 25.0-29.9) 02/20/2022   COPD (chronic obstructive pulmonary disease) (HCC) 02/06/2022   Lung nodules 02/06/2022   Allergic rhinitis 02/06/2022   GAD (generalized anxiety disorder) 12/26/2021   Alcohol use disorder, moderate, in sustained remission (HCC) 12/26/2021   Need for vaccination against Streptococcus pneumoniae 11/17/2021   Sleep disorder 10/23/2021   Iron deficiency anemia 06/26/2021   Depression, major, single episode, moderate (HCC) 06/26/2021   Status post total hip replacement, right 04/15/2021   Chronic radicular lumbar pain 10/03/2020   Postlaminectomy syndrome, lumbar region 06/03/2020   Moderate episode of recurrent major depressive disorder (HCC) 12/04/2019   Spondylosis of cervical region without myelopathy or radiculopathy 09/21/2019   DDD (degenerative disc disease), cervical 09/21/2019   Cervicalgia 09/21/2019   Cervical fusion syndrome 09/21/2019   Chronic pain syndrome 09/21/2019   Spinal stenosis, lumbar region, with neurogenic claudication 08/15/2019   History of adenomatous polyp of colon 11/04/2017   Cervical radiculopathy 08/02/2017   Neuropathy 08/02/2017   Essential hypertension 07/28/2017   Closed compression fracture of L5 lumbar vertebra 07/07/2017   Sacral insufficiency fracture with routine healing 07/07/2017   Family history of malignant neoplasm of pancreas 04/03/2015   Hypercholesteremia 04/03/2015   Disorder of iron metabolism 04/03/2015   Carpal tunnel syndrome 03/29/2015   Acid reflux 10/11/2014   Closed fracture of distal phalanx of thumb 08/15/2014   Arthritis, degenerative 01/30/2014   Arthritis or polyarthritis, rheumatoid (HCC) 01/30/2014   Paroxysmal supraventricular tachycardia 09/22/2010   B-complex deficiency 09/09/2007   CN (constipation) 06/26/2007   Clinical depression 06/26/2007   Cold sore 06/26/2007   H/O alcohol abuse  06/26/2007   Cannot sleep 06/26/2007   Localized osteoarthrosis, hand 06/26/2007   Menopausal symptom 06/26/2007    Social History   Tobacco Use   Smoking status: Former    Current packs/day: 0.00    Average packs/day: 2.0 packs/day for 45.0 years (90.0 ttl pk-yrs)    Types: Cigarettes    Start date: 10/29/1975    Quit date: 10/28/2020    Years since quitting: 2.5   Smokeless tobacco: Never   Tobacco comments:    [name] been quit for about a year now.  Substance Use Topics   Alcohol use: No    Comment: recovering alcoholic Since 1994     Allergies  Allergen Reactions   Sulfasalazine Other (See Comments)   Plaquenil [Hydroxychloroquine] Rash    Current Meds  Medication Sig   acyclovir ointment (ZOVIRAX) 5 % :Topical Every 3 Hours as needed for lesion   albuterol (PROVENTIL) (2.5 MG/3ML) 0.083% nebulizer solution Take 3 mLs (2.5 mg total) by nebulization every 4 (four) hours as needed for wheezing or shortness of breath.   albuterol (VENTOLIN HFA) 108 (90 Base) MCG/ACT inhaler INHALE TWO PUFFS BY MOUTH INTO LUNGS every SIX hours AS NEEDED FOR WHEEZING AND/OR SHORTNESS OF BREATH   ARIPiprazole (ABILIFY) 2 MG tablet Take 0.5 tablets (1 mg total) by mouth daily.   atorvastatin (LIPITOR) 40 MG tablet Take 1 tablet (40 mg total) by mouth daily.   Budeson-Glycopyrrol-Formoterol (BREZTRI AEROSPHERE) 160-9-4.8 MCG/ACT AERO Inhale 2 puffs into the lungs in the morning and at bedtime.   carvedilol (COREG) 12.5 MG tablet TAKE ONE TABLET  BY MOUTH AT BREAKFAST AND AT BEDTIME   ezetimibe (ZETIA) 10 MG tablet Take 1 tablet (10 mg total) by mouth daily.   fexofenadine (ALLEGRA) 180 MG tablet Take 180 mg by mouth daily.   furosemide (LASIX) 20 MG tablet TAKE ONE TABLET BY MOUTH daily   gabapentin (NEURONTIN) 400 MG capsule Take 1 capsule (400 mg total) by mouth at bedtime.   gentamicin cream (GARAMYCIN) 0.1 % Apply to affected toes once daily. Cover with fabric band-aid.   ipratropium (ATROVENT)  0.03 % nasal spray Place 2 sprays into both nostrils every 12 (twelve) hours.   leflunomide (ARAVA) 20 MG tablet Take 20 mg by mouth at bedtime.   LINZESS 145 MCG CAPS capsule Take 145 mcg by mouth daily before breakfast.   losartan (COZAAR) 25 MG tablet TAKE ONE TABLET BY MOUTH EVERYDAY AT BEDTIME   meloxicam (MOBIC) 15 MG tablet Take 15 mg by mouth at bedtime.   mirtazapine (REMERON) 15 MG tablet TAKE ONE TABLET BY MOUTH EVERYDAY AT BEDTIME   montelukast (SINGULAIR) 10 MG tablet TAKE ONE TABLET BY MOUTH EVERYDAY AT BEDTIME   Multiple Vitamin (MULTIVITAMIN WITH MINERALS) TABS tablet Take 1 tablet by mouth daily.   Nebulizer MISC Compressor nebulizer to use with nebulized medications as instructed   omeprazole (PRILOSEC) 40 MG capsule TAKE ONE CAPSULE BY MOUTH AT BREAKFAST AND AT BEDTIME   [START ON 06/28/2023] oxyCODONE-acetaminophen (PERCOCET) 5-325 MG tablet Take 1 tablet by mouth every 12 (twelve) hours as needed for severe pain. Must last 30 days.   [START ON 05/29/2023] oxyCODONE-acetaminophen (PERCOCET) 5-325 MG tablet Take 1 tablet by mouth every 12 (twelve) hours as needed for severe pain. Must last 30 days.   oxyCODONE-acetaminophen (PERCOCET) 5-325 MG tablet Take 1 tablet by mouth every 12 (twelve) hours as needed for severe pain. Must last 30 days.   potassium chloride SA (KLOR-CON M) 20 MEQ tablet TAKE ONE TABLET BY MOUTH EVERY MORNING   topiramate (TOPAMAX) 25 MG tablet Take 1 tablet (25 mg total) by mouth 2 (two) times daily.   valACYclovir (VALTREX) 1000 MG tablet TAKE TWO TABLETS BY MOUTH TWICE DAILY AS NEEDED FOR 5 DAYS FOR FEVER blisters   venlafaxine XR (EFFEXOR-XR) 75 MG 24 hr capsule TAKE THREE CAPSULES BY MOUTH EVERY MORNING   XIIDRA 5 % SOLN Place 1 drop into both eyes daily.    Immunization History  Administered Date(s) Administered   Fluad Quad(high Dose 65+) 10/01/2021, 05/14/2022   Influenza Split 09/07/2012   Influenza,inj,Quad PF,6+ Mos 11/18/2015, 07/23/2016,  07/02/2017, 04/14/2018, 04/17/2019, 04/19/2020   Influenza-Unspecified 09/24/2014   PFIZER(Purple Top)SARS-COV-2 Vaccination 11/21/2019, 12/12/2019   PNEUMOCOCCAL CONJUGATE-20 11/17/2021   Pneumococcal Polysaccharide-23 02/12/2011, 09/24/2014, 04/19/2020   Tdap 02/12/2011, 11/15/2015        Objective:     BP 110/72 (BP Location: Right Arm, Cuff Size: Normal)   Pulse 72   Temp 98.6 F (37 C)   Ht 5\' 9"  (1.753 m)   Wt 168 lb 3.2 oz (76.3 kg)   SpO2 99%   BMI 24.84 kg/m   SpO2: 99 % O2 Device: None (Room air)  GENERAL: Well-developed, overweight woman, no acute distress.  Uses accessories of respiration appears to be on chronic basis, less pronounced than previous.  Fully ambulatory.  No conversational dyspnea. HEAD: Normocephalic, atraumatic.  EYES: Pupils equal, round, reactive to light.  No scleral icterus.  MOUTH: Poor dentition, oral mucosa moist.  No thrush. NECK: Supple. No thyromegaly. Trachea midline. No JVD.  No adenopathy. PULMONARY:  Good air entry bilaterally.  She has scattered wheezing throughout. CARDIOVASCULAR: S1 and S2. Regular rate and rhythm.  Grade 2/6 systolic ejection murmur left sternal border.  This appears to be new. ABDOMEN: Benign. MUSCULOSKELETAL: Stigmata of rheumatoid arthritis on both hands, no clubbing, no edema.  NEUROLOGIC: No overt focal deficit.  No gait disturbance.  Speech is fluent. SKIN: Intact,warm,dry. PSYCH: Mood and behavior normal.   Ambulatory oxymetry was performed today:  At rest on room air oxygen saturation was 95%, the patient ambulated at a moderate pace, completed 3 laps, O2 nadir 92%, moderate shortness of breath.  Resting heart rate was 67 bpm at maximum for this exercise 107 bpm.   Assessment & Plan:     ICD-10-CM   1. Stage 3 severe COPD by GOLD classification (HCC)  J44.9 Pulmonary Function Test [facility] Only   Continue Breztri 2 puffs twice a day Continue as needed albuterol Reassess with PFTs    2. Nocturnal  hypoxemia due to obstructive chronic bronchitis  J44.89    G47.36    Compliant with oxygen at 2 L/min Patient notes benefit of therapy Per minute nocturnally    3. SOB (shortness of breath)  R06.02 ECHOCARDIOGRAM COMPLETE    Pulmonary Function Test [facility] Only   Improved with Breztri Query cardiac component vis--vis new murmur PFTs/echocardiogram    4. Cardiac murmur, previously undiagnosed  R01.1 ECHOCARDIOGRAM COMPLETE   Echocardiogram will be obtained     Orders Placed This Encounter  Procedures   Pulmonary Function Test [facility] Only    Standing Status:   Future    Standing Expiration Date:   05/09/2024    Order Specific Question:   Full PFT: includes the following: basic spirometry, spirometry pre & post bronchodilator, diffusion capacity (DLCO), lung volumes    Answer:   Full PFT    Order Specific Question:   This test can only be performed at    Answer:   [facility]   ECHOCARDIOGRAM COMPLETE    Standing Status:   Future    Standing Expiration Date:   05/09/2024    Order Specific Question:   Where should this test be performed    Answer:   [facility]    Order Specific Question:   Please indicate who you request to read the nuc med / echo results.    Answer:   [facility]    Order Specific Question:   Perflutren DEFINITY (image enhancing agent) should be administered unless hypersensitivity or allergy exist    Answer:   Administer Perflutren    Order Specific Question:   Reason for exam-Echo    Answer:   Murmur R01.1   Smoking cessation instruction/counseling given:  commended patient for quitting and reviewed strategies for preventing relapses.  Overall Rheba is doing well.  She is to continue Breztri 2 puffs twice a day and as needed albuterol.  She is to continue oxygen at 2 L/min nocturnally.  Will reassess lung function with PFTs.  She is to follow-up in 4 months time she is to contact us prior to that time should any new difficulties arise.   Gailen Shelter, MD Advanced Bronchoscopy [facility]    *This note was dictated using voice recognition software/Dragon.  Despite best efforts to proofread, errors can occur which can change the meaning. Any transcriptional errors that result from this process are unintentional and may not be fully corrected at the time of dictation.

## 2023-05-10 NOTE — Patient Instructions (Signed)
We are going to get a new breathing test.  We are going to get an echocardiogram (heart test) to check on your murmur.  You did well with your walking test today.  Your lungs sounded clear today.  Continue Breztri 2 puffs twice a day and as needed albuterol (rescue inhaler).  We will see her in follow-up in 4 months time call sooner should any new problems arise.

## 2023-05-10 NOTE — Telephone Encounter (Signed)
Requested Prescriptions  Pending Prescriptions Disp Refills   potassium chloride SA (KLOR-CON M) 20 MEQ tablet [Pharmacy Med Name: POTASSIUM CHL MICR TB 20 Tablet] 90 tablet 1    Sig: TAKE 1 TABLET BY MOUTH EVERY MORNING     Endocrinology:  Minerals - Potassium Supplementation Passed - 05/07/2023  6:50 PM      Passed - K in normal range and within 360 days    Potassium  Date Value Ref Range Status  09/11/2022 4.1 3.5 - 5.1 mmol/L Final         Passed - Cr in normal range and within 360 days    Creatinine, Ser  Date Value Ref Range Status  09/11/2022 1.00 0.44 - 1.00 mg/dL Final         Passed - Valid encounter within last 12 months    Recent Outpatient Visits           3 months ago Essential hypertension   [place] [facility] [name], PA-C   5 months ago Annual physical exam   [facility] [name], PA-C   8 months ago COPD exacerbation [facility])   [facility] Edwards, [place], PA-C   11 months ago Acute bronchitis with COPD [facility])   [facility] [name], PA-C   12 months ago Essential hypertension   [facility] [name], PA-C       Future Appointments             In 2 months Pardue, Monico Blitz, DO [facility]

## 2023-05-12 ENCOUNTER — Encounter: Payer: Self-pay | Admitting: Psychiatry

## 2023-05-12 ENCOUNTER — Telehealth: Payer: Medicare Other | Admitting: Psychiatry

## 2023-05-12 DIAGNOSIS — F411 Generalized anxiety disorder: Secondary | ICD-10-CM

## 2023-05-12 DIAGNOSIS — F5101 Primary insomnia: Secondary | ICD-10-CM | POA: Diagnosis not present

## 2023-05-12 DIAGNOSIS — F3342 Major depressive disorder, recurrent, in full remission: Secondary | ICD-10-CM | POA: Diagnosis not present

## 2023-05-12 DIAGNOSIS — F1021 Alcohol dependence, in remission: Secondary | ICD-10-CM | POA: Diagnosis not present

## 2023-05-12 NOTE — Progress Notes (Unsigned)
Virtual Visit via Telephone Note  I connected with Gabrielle Pineda on 05/12/23 at  4:30 PM EDT by telephone and verified that I am speaking with the correct person using two identifiers.  Location Provider Location : [facility] Patient Location : Home  Participants: Patient , Provider    I discussed the limitations, risks, security and privacy concerns of performing an evaluation and management service by telephone and the availability of in person appointments. I also discussed with the patient that there may be a patient responsible charge related to this service. The patient expressed understanding and agreed to proceed.    I discussed the assessment and treatment plan with the patient. The patient was provided an opportunity to ask questions and all were answered. The patient agreed with the plan and demonstrated an understanding of the instructions.   The patient was advised to call back or seek an in-person evaluation if the symptoms worsen or if the condition fails to improve as anticipated.   BH MD OP Progress Note  05/13/2023 7:39 AM Gabrielle Pineda  MRN:  [id number]  Chief Complaint:  Chief Complaint  Patient presents with   Follow-up   Depression   Anxiety   Medication Refill   HPI: Gabrielle Pineda is a 67 year old Caucasian female on disability, lives in [place], has a history of primary insomnia, MDD, GAD, alcohol use disorder in remission, history of back pain, history of spinal cord stimulator, paroxysmal ventricular tachycardia, hypercholesterolemia, chronic pain syndrome, GERD was evaluated by phone today.  Patient today reports she is currently doing fairly well on the current medication regimen.  Denies any significant depression or anxiety symptoms.  Patient reports sleep is overall good.  Patient reports she has been able to taper herself off of the Abilify.  Denies any worsening mood symptoms since being off of the Abilify.  Patient is currently compliant  on medications like venlafaxine, mirtazapine.  Denies side effects.  Patient denies any other concerns today.  Visit Diagnosis:    ICD-10-CM   1. Primary insomnia  F51.01     2. MDD (major depressive disorder), recurrent, in full remission (HCC)  F33.42     3. GAD (generalized anxiety disorder)  F41.1     4. Alcohol use disorder, moderate, in sustained remission (HCC)  F10.21       Past Psychiatric History: I have reviewed past psychiatric history from progress note on 12/26/2021.  Trials of medications like sertraline, multiple other medications-does not remember all the names.  Past Medical History:  Past Medical History:  Diagnosis Date   Alcohol abuse    Allergy 1983   Anemia    Anxiety    Arthritis    Cataract    Cervicalgia    Cirrhosis (HCC) 1994   COPD (chronic obstructive pulmonary disease) (HCC)    Depression    Difficult intubation    has plates and screws in neck   Dyspnea    GERD (gastroesophageal reflux disease)    Headache    Heart murmur    on heard when pt is lying   Hypertension    Neuromuscular disorder (HCC)    Neuropathy   Other and unspecified hyperlipidemia    Oxygen deficiency    Status post insertion of spinal cord stimulator    Tachycardia    d/t questionable anxiety happens every 5- 10 years, sees [facility]    Past Surgical History:  Procedure Laterality Date   ANTERIOR CERVICAL DECOMP/DISCECTOMY FUSION  2012, 2015, 2018  x3   AUGMENTATION MAMMAPLASTY Bilateral 1982   BREAST ENHANCEMENT SURGERY  1981   CARPAL TUNNEL RELEASE  11/11/2011   Procedure: CARPAL TUNNEL RELEASE;  Surgeon: [name], MD;  Location: [facility];  Service: Neurosurgery;  Laterality: Right;  Right Median Nerve Decompression   CARPAL TUNNEL RELEASE  2013   COLONOSCOPY WITH PROPOFOL N/A 11/01/2017   Procedure: COLONOSCOPY WITH PROPOFOL;  Surgeon: [name], MD;  Location: [facility] ENDOSCOPY;  Service: Endoscopy;  Laterality: N/A;    ESOPHAGOGASTRODUODENOSCOPY     JOINT REPLACEMENT  2022   LUMBAR LAMINECTOMY/DECOMPRESSION MICRODISCECTOMY N/A 08/15/2019   Procedure: Lumbar three to Sacral one Decompressive lumbar laminectomy;  Surgeon: [name], MD;  Location: [facility] OR;  Service: Neurosurgery;  Laterality: N/A;   neck disc surgery     plates and screws in neck x 2   ROTATOR CUFF REPAIR  06/16/2016   right shoulder    ROTATOR CUFF REPAIR     SPINAL CORD STIMULATOR INSERTION N/A 03/17/2021   Procedure: THORACIC SPINAL CORD STIMULATOR (PERCUTANEOUS) & PULSE GENERATOR PLACEMENT;  Surgeon: [name], MD;  Location: [facility] ORS;  Service: Neurosurgery;  Laterality: N/A;   SPINE SURGERY  2020   TONSILLECTOMY AND ADENOIDECTOMY  1973   TOTAL HIP ARTHROPLASTY Right 04/15/2021   Procedure: TOTAL HIP ARTHROPLASTY ANTERIOR APPROACH;  Surgeon: [name], MD;  Location: [facility] ORS;  Service: Orthopedics;  Laterality: Right;    Family Psychiatric History: I have reviewed family psychiatric history from progress note on 12/26/2021.  Family History:  Family History  Problem Relation Age of Onset   Alcohol abuse Mother    Bipolar disorder Mother    Suicidality Mother    Arthritis Mother    Early death Mother    Pancreatic cancer Father    Alcohol abuse Father    Cancer Father    Drug abuse Sister    Alcohol abuse Sister    Depression Sister    Alcohol abuse Sister    Anxiety disorder Sister    Anesthesia problems Neg Hx    Breast cancer Neg Hx     Social History: I have reviewed social history from progress note on 12/26/2021. Social History   Socioeconomic History   Marital status: Married    Spouse name: jerry   Number of children: 0   Years of education: Not on file   Highest education level: Bachelor's degree (e.g., BA, AB, BS)  Occupational History   Not on file  Tobacco Use   Smoking status: Former    Current packs/day: 0.00    Average packs/day: 2.0 packs/day for 45.0 years (90.0 ttl pk-yrs)    Types:  Cigarettes    Start date: 10/29/1975    Quit date: 10/28/2020    Years since quitting: 2.5   Smokeless tobacco: Never   Tobacco comments:    [name] been quit for about a year now.  Vaping Use   Vaping status: Former   Devices: vape occasionally  Substance and Sexual Activity   Alcohol use: No    Comment: recovering alcoholic Since 1994    Drug use: Never   Sexual activity: Not Currently    Birth control/protection: Abstinence  Other Topics Concern   Not on file  Social History Narrative   Married; full time; does not get regular exercise.    Social Determinants of Health   Financial Resource Strain: Medium Risk (01/31/2023)   Overall Financial Resource Strain (CARDIA)    Difficulty of Paying Living Expenses: Somewhat  hard  Food Insecurity: No Food Insecurity (01/31/2023)   Hunger Vital Sign    Worried About Running Out of Food in the Last Year: Never true    Ran Out of Food in the Last Year: Never true  Transportation Needs: No Transportation Needs (01/31/2023)   PRAPARE - Administrator, Civil Service (Medical): No    Lack of Transportation (Non-Medical): No  Physical Activity: Inactive (01/31/2023)   Exercise Vital Sign    Days of Exercise per Week: 0 days    Minutes of Exercise per Session: 30 min  Stress: No Stress Concern Present (01/31/2023)   [employer] of Occupational Health - Occupational Stress Questionnaire    Feeling of Stress : Not at all  Social Connections: Moderately Isolated (01/31/2023)   Social Connection and Isolation Panel [NHANES]    Frequency of Communication with Friends and Family: More than three times a week    Frequency of Social Gatherings with Friends and Family: More than three times a week    Attends Religious Services: Never    Database administrator or Organizations: No    Attends Banker Meetings: Never    Marital Status: Married    Allergies:  Allergies  Allergen Reactions   Sulfasalazine Other (See  Comments)   Plaquenil [Hydroxychloroquine] Rash    Metabolic Disorder Labs: Lab Results  Component Value Date   HGBA1C 5.3 11/23/2022   No results found for: "PROLACTIN" Lab Results  Component Value Date   CHOL 170 11/23/2022   TRIG 196 (H) 11/23/2022   HDL 59 11/23/2022   CHOLHDL 2.9 11/23/2022   LDLCALC 78 11/23/2022   LDLCALC 79 11/17/2021   Lab Results  Component Value Date   TSH 3.673 03/10/2022   TSH 2.170 11/17/2021    Therapeutic Level Labs: No results found for: "LITHIUM" No results found for: "VALPROATE" No results found for: "CBMZ"  Current Medications: Current Outpatient Medications  Medication Sig Dispense Refill   acyclovir ointment (ZOVIRAX) 5 % :Topical Every 3 Hours as needed for lesion 30 g 1   albuterol (PROVENTIL) (2.5 MG/3ML) 0.083% nebulizer solution Take 3 mLs (2.5 mg total) by nebulization every 4 (four) hours as needed for wheezing or shortness of breath. 225 mL 2   albuterol (VENTOLIN HFA) 108 (90 Base) MCG/ACT inhaler INHALE TWO (2) PUFFS BY MOUTH AND INTO THE LUNGS EVERY 6 HOURS AS NEEDED FOR SHORTNESS OF BREATH 8.5 g 10   atorvastatin (LIPITOR) 40 MG tablet Take 1 tablet (40 mg total) by mouth daily. 90 tablet 3   Budeson-Glycopyrrol-Formoterol (BREZTRI AEROSPHERE) 160-9-4.8 MCG/ACT AERO Inhale 2 puffs into the lungs in the morning and at bedtime. 10.7 g 11   carvedilol (COREG) 12.5 MG tablet TAKE ONE TABLET BY MOUTH AT BREAKFAST AND AT BEDTIME 180 tablet 3   ezetimibe (ZETIA) 10 MG tablet Take 1 tablet (10 mg total) by mouth daily. 90 tablet 3   fexofenadine (ALLEGRA) 180 MG tablet Take 180 mg by mouth daily.     furosemide (LASIX) 20 MG tablet TAKE ONE TABLET BY MOUTH daily 90 tablet 0   gabapentin (NEURONTIN) 400 MG capsule Take 1 capsule (400 mg total) by mouth at bedtime. 30 capsule 5   gentamicin cream (GARAMYCIN) 0.1 % Apply to affected toes once daily. Cover with fabric band-aid. 15 g 0   ipratropium (ATROVENT) 0.03 % nasal spray Place  2 sprays into both nostrils every 12 (twelve) hours. 30 mL 12   leflunomide (ARAVA) 20 MG  tablet Take 20 mg by mouth at bedtime.     LINZESS 145 MCG CAPS capsule Take 145 mcg by mouth daily before breakfast.     losartan (COZAAR) 25 MG tablet TAKE ONE TABLET BY MOUTH EVERYDAY AT BEDTIME 90 tablet 1   meloxicam (MOBIC) 15 MG tablet Take 15 mg by mouth at bedtime.     mirtazapine (REMERON) 15 MG tablet TAKE ONE TABLET BY MOUTH EVERYDAY AT BEDTIME 90 tablet 0   montelukast (SINGULAIR) 10 MG tablet TAKE ONE TABLET BY MOUTH EVERYDAY AT BEDTIME 30 tablet 11   Multiple Vitamin (MULTIVITAMIN WITH MINERALS) TABS tablet Take 1 tablet by mouth daily.     Nebulizer MISC Compressor nebulizer to use with nebulized medications as instructed 1 each 0   omeprazole (PRILOSEC) 40 MG capsule TAKE ONE CAPSULE BY MOUTH AT BREAKFAST AND AT BEDTIME 180 capsule 1   [START ON 06/28/2023] oxyCODONE-acetaminophen (PERCOCET) 5-325 MG tablet Take 1 tablet by mouth every 12 (twelve) hours as needed for severe pain. Must last 30 days. 60 tablet 0   [START ON 05/29/2023] oxyCODONE-acetaminophen (PERCOCET) 5-325 MG tablet Take 1 tablet by mouth every 12 (twelve) hours as needed for severe pain. Must last 30 days. 60 tablet 0   oxyCODONE-acetaminophen (PERCOCET) 5-325 MG tablet Take 1 tablet by mouth every 12 (twelve) hours as needed for severe pain. Must last 30 days. 60 tablet 0   potassium chloride SA (KLOR-CON M) 20 MEQ tablet TAKE 1 TABLET BY MOUTH EVERY MORNING 90 tablet 1   topiramate (TOPAMAX) 25 MG tablet Take 1 tablet (25 mg total) by mouth 2 (two) times daily. 60 tablet 11   valACYclovir (VALTREX) 1000 MG tablet TAKE TWO TABLETS BY MOUTH TWICE DAILY AS NEEDED FOR 5 DAYS FOR FEVER blisters 30 tablet 1   venlafaxine XR (EFFEXOR-XR) 75 MG 24 hr capsule TAKE THREE CAPSULES BY MOUTH EVERY MORNING 270 capsule 0   XIIDRA 5 % SOLN Place 1 drop into both eyes daily.     No current facility-administered medications for this visit.      Musculoskeletal: Strength & Muscle Tone:  UTA Gait & Station:  UTA Patient leans: N/A  Psychiatric Specialty Exam: Review of Systems  Psychiatric/Behavioral: Negative.      There were no vitals taken for this visit.There is no height or weight on file to calculate BMI.  General Appearance:  UTA  Eye Contact:   UTA  Speech:  Clear and Coherent  Volume:  Normal  Mood:  Euthymic  Affect:   UTA  Thought Process:  Goal Directed and Descriptions of Associations: Intact  Orientation:  Full (Time, Place, and Person)  Thought Content: Logical   Suicidal Thoughts:  No  Homicidal Thoughts:  No  Memory:  Immediate;   Fair Recent;   Fair Remote;   Fair  Judgement:  Fair  Insight:  Fair  Psychomotor Activity:   UTA  Concentration:  Concentration: Fair and Attention Span: Fair  Recall:  [employer] of Knowledge: Fair  Language: Fair  Akathisia:  No  Handed:  Right  AIMS (if indicated): not done  Assets:  Communication Skills Desire for Improvement Housing Social Support  ADL's:  Intact  Cognition: WNL  Sleep:  Fair   Screenings: AIMS    Flowsheet Row Video Visit from 08/13/2022 in [facility] Office Visit from 05/13/2022 in [facility] Office Visit from 03/31/2022 in [facility] Office Visit from 01/22/2022  in [facility] Office Visit from 12/26/2021 in [facility]  AIMS Total Score 0 0 0 0 0      GAD-7    Flowsheet Row Video Visit from 12/10/2022 in [facility] Office Visit from 05/13/2022 in [facility] Office Visit from 03/31/2022 in [facility] Office Visit from 01/22/2022 in [facility] Office Visit from 12/26/2021  in [facility]  Total GAD-7 Score 0 0 2 0 0      PHQ2-9    Flowsheet Row Video Visit from 05/12/2023 in [facility] Most recent reading at 05/12/2023  4:45 PM Office Visit from 02/02/2023 in [facility] Most recent reading at 02/02/2023  9:21 AM Pulmonary Rehab from 12/10/2022 in [facility] Most recent reading at 12/10/2022  2:13 PM Video Visit from 12/10/2022 in [facility] Most recent reading at 12/10/2022  1:10 PM Office Visit from 11/23/2022 in [facility] Most recent reading at 11/23/2022 10:02 AM  PHQ-2 Total Score 0 0 0 0 0  PHQ-9 Total Score -- 1 0 0 0      Flowsheet Row Video Visit from 05/12/2023 in [facility] Video Visit from 03/02/2023 in [facility] Video Visit from 12/10/2022 in [facility]  C-SSRS RISK CATEGORY No Risk No Risk Low Risk        Assessment and Plan: Gabrielle Pineda is a 67 year old Caucasian female, married, on disability, lives in [place], has a history of GAD, MDD, sleep problems, COPD, multiple other medical problems, currently stable on current medication regimen, tolerated being tapered off of Abilify, plan as noted below.  Plan MDD in full remission Venlafaxine 225 mg p.o. daily with breakfast. Abilify tapered off.  GAD-stable Venlafaxine 225 mg p.o. daily  Primary insomnia-stable Mirtazapine 15 mg p.o. nightly Continue sleep hygiene techniques  Alcohol use disorder in remission Sober since 1994.   Follow-up in clinic in 5 months or sooner in person.  Consent: Patient/Guardian gives verbal consent for treatment and assignment of benefits for services provided during this visit. Patient/Guardian expressed understanding and agreed to  proceed.    I have spent at least 11 minutes non face to face with patient today.   This note was generated in part or whole with voice recognition software. Voice recognition is usually quite accurate but there are transcription errors that can and very often do occur. I apologize for any typographical errors that were not detected and corrected.    Jomarie Longs, MD 05/13/2023, 7:39 AM

## 2023-05-19 ENCOUNTER — Encounter: Payer: Self-pay | Admitting: Student in an Organized Health Care Education/Training Program

## 2023-05-21 ENCOUNTER — Other Ambulatory Visit: Payer: Self-pay | Admitting: *Deleted

## 2023-05-21 DIAGNOSIS — M47812 Spondylosis without myelopathy or radiculopathy, cervical region: Secondary | ICD-10-CM

## 2023-05-24 DIAGNOSIS — M542 Cervicalgia: Secondary | ICD-10-CM | POA: Diagnosis not present

## 2023-05-24 DIAGNOSIS — M4712 Other spondylosis with myelopathy, cervical region: Secondary | ICD-10-CM | POA: Diagnosis not present

## 2023-05-24 MED ORDER — TOPIRAMATE 25 MG PO TABS
25.0000 mg | ORAL_TABLET | Freq: Two times a day (BID) | ORAL | 11 refills | Status: DC
Start: 2023-05-24 — End: 2023-10-26

## 2023-05-24 NOTE — Telephone Encounter (Signed)
Notified patient per voicemail.

## 2023-05-25 ENCOUNTER — Other Ambulatory Visit: Payer: Self-pay | Admitting: Neurosurgery

## 2023-05-25 DIAGNOSIS — M4712 Other spondylosis with myelopathy, cervical region: Secondary | ICD-10-CM

## 2023-05-31 DIAGNOSIS — R413 Other amnesia: Secondary | ICD-10-CM | POA: Diagnosis not present

## 2023-05-31 DIAGNOSIS — G629 Polyneuropathy, unspecified: Secondary | ICD-10-CM | POA: Diagnosis not present

## 2023-05-31 DIAGNOSIS — R2689 Other abnormalities of gait and mobility: Secondary | ICD-10-CM | POA: Diagnosis not present

## 2023-05-31 DIAGNOSIS — E538 Deficiency of other specified B group vitamins: Secondary | ICD-10-CM | POA: Diagnosis not present

## 2023-05-31 DIAGNOSIS — G3184 Mild cognitive impairment, so stated: Secondary | ICD-10-CM | POA: Diagnosis not present

## 2023-05-31 DIAGNOSIS — G43009 Migraine without aura, not intractable, without status migrainosus: Secondary | ICD-10-CM | POA: Diagnosis not present

## 2023-06-01 ENCOUNTER — Other Ambulatory Visit: Payer: Self-pay | Admitting: Student in an Organized Health Care Education/Training Program

## 2023-06-01 DIAGNOSIS — G894 Chronic pain syndrome: Secondary | ICD-10-CM

## 2023-06-02 ENCOUNTER — Encounter: Payer: Self-pay | Admitting: Psychiatry

## 2023-06-02 ENCOUNTER — Telehealth: Payer: Medicare Other | Admitting: Psychiatry

## 2023-06-02 ENCOUNTER — Ambulatory Visit
Admission: RE | Admit: 2023-06-02 | Discharge: 2023-06-02 | Disposition: A | Discharge status: Home / Self Care | Payer: Medicare Other | Source: Ambulatory Visit | Attending: Pulmonary Disease | Admitting: Pulmonary Disease

## 2023-06-02 DIAGNOSIS — F411 Generalized anxiety disorder: Secondary | ICD-10-CM

## 2023-06-02 DIAGNOSIS — Z79899 Other long term (current) drug therapy: Secondary | ICD-10-CM | POA: Insufficient documentation

## 2023-06-02 DIAGNOSIS — I10 Essential (primary) hypertension: Secondary | ICD-10-CM | POA: Diagnosis not present

## 2023-06-02 DIAGNOSIS — F5101 Primary insomnia: Secondary | ICD-10-CM | POA: Diagnosis not present

## 2023-06-02 DIAGNOSIS — F419 Anxiety disorder, unspecified: Secondary | ICD-10-CM | POA: Diagnosis not present

## 2023-06-02 DIAGNOSIS — F1021 Alcohol dependence, in remission: Secondary | ICD-10-CM | POA: Diagnosis not present

## 2023-06-02 DIAGNOSIS — R011 Cardiac murmur, unspecified: Secondary | ICD-10-CM | POA: Insufficient documentation

## 2023-06-02 DIAGNOSIS — F3342 Major depressive disorder, recurrent, in full remission: Secondary | ICD-10-CM | POA: Diagnosis not present

## 2023-06-02 DIAGNOSIS — R0602 Shortness of breath: Secondary | ICD-10-CM | POA: Diagnosis not present

## 2023-06-02 LAB — ECHOCARDIOGRAM COMPLETE
AR max vel: 2.37 cm2
AV Area VTI: 2.18 cm2
AV Area mean vel: 1.98 cm2
AV Mean grad: 5 mm[Hg]
AV Peak grad: 9.2 mm[Hg]
Ao pk vel: 1.51 m/s
Area-P 1/2: 3.76 cm2
MV VTI: 2.25 cm2
S' Lateral: 2.7 cm

## 2023-06-02 MED ORDER — HYDROXYZINE HCL 10 MG PO TABS
10.0000 mg | ORAL_TABLET | Freq: Two times a day (BID) | ORAL | 1 refills | Status: DC | PRN
Start: 2023-06-02 — End: 2023-07-07

## 2023-06-02 NOTE — Progress Notes (Unsigned)
Virtual Visit via Video Note  I connected with Gabrielle Pineda on 06/02/23 at  4:00 PM EDT by a video enabled telemedicine application and verified that I am speaking with the correct person using two identifiers.  Location Provider Location : [facility] Patient Location : Home  Participants: Patient , Provider    I discussed the limitations of evaluation and management by telemedicine and the availability of in person appointments. The patient expressed understanding and agreed to proceed.   I discussed the assessment and treatment plan with the patient. The patient was provided an opportunity to ask questions and all were answered. The patient agreed with the plan and demonstrated an understanding of the instructions.   The patient was advised to call back or seek an in-person evaluation if the symptoms worsen or if the condition fails to improve as anticipated.  Video connection was lost at less than 50% of the duration of the visit, at which time the remainder of the visit was completed through audio only     [facility] MD OP Progress Note  06/03/2023 5:26 PM [name]  MRN:  [id number]  Chief Complaint:  Chief Complaint  Patient presents with   Follow-up   Anxiety   Depression   Medication Refill   HPI: Gabrielle Pineda is a 67 year old Caucasian female on disability, lives in [place], has a history of primary insomnia, MDD, GAD, alcohol use disorder in remission, history of back pain, history of spinal cord stimulator, paroxysmal ventricular tachycardia, hypercholesterolemia, chronic pain syndrome, GERD was evaluated by telemedicine today.  Patient had trouble connecting by video and hence session had to be changed to a phone call.  Patient today reports she is currently struggling with worsening anxiety attacks.  She reports this may have started a few weeks ago.  Patient describes her anxiety symptoms as feeling sweaty, having racing heart rate and feeling weak on her lower  extremities.  She reports it comes out of nowhere and it last for 30 minutes or so.  She usually calms herself down by sitting in front of a fan.  This morning she had it twice when she was getting ready to go for her echocardiogram visit.  Patient reports it lasted for 20 to 30 minutes.  Patient otherwise unable to elaborate further.  She does not believe there are any other situational stressors at this time.  Patient denies any depression symptoms.  She denies worrying about anything specific.  She denies any substance use problems at this time.  Patient currently does not have a therapist however agrees to get established with a therapist for her worsening anxiety symptoms.  She denies any suicidality, homicidality or perceptual disturbances.  Patient appeared to be alert, oriented to person place time and situation.  3 word memory immediate 3 out of 3 immediate, after 5 minutes 2 out of 3.  Patient was able to give the date correctly.  Patient was able to do calculations serial sevens correctly.  Attention and focus seem to be good.  Patient denies any other concerns today.  Visit Diagnosis:    ICD-10-CM   1. Primary insomnia  F51.01     2. MDD (major depressive disorder), recurrent, in full remission (HCC)  F33.42     3. GAD (generalized anxiety disorder)  F41.1 hydrOXYzine (ATARAX) 10 MG tablet    4. Alcohol use disorder, moderate, in sustained remission (HCC)  F10.21       Past Psychiatric History: I have not past psychiatric history  from progress note on 12/26/2021.  Trials of medications like sertraline, multiple other medications-does not remember all the names.  Past Medical History:  Past Medical History:  Diagnosis Date   Alcohol abuse    Allergy 1983   Anemia    Anxiety    Arthritis    Cataract    Cervicalgia    Cirrhosis (HCC) 1994   COPD (chronic obstructive pulmonary disease) (HCC)    Depression    Difficult intubation    has plates and screws in neck    Dyspnea    GERD (gastroesophageal reflux disease)    Headache    Heart murmur    on heard when pt is lying   Hypertension    Neuromuscular disorder (HCC)    Neuropathy   Other and unspecified hyperlipidemia    Oxygen deficiency    Status post insertion of spinal cord stimulator    Tachycardia    d/t questionable anxiety happens every 5- 10 years, sees [facility]    Past Surgical History:  Procedure Laterality Date   ANTERIOR CERVICAL DECOMP/DISCECTOMY FUSION  2012, 2015, 2018   x3   AUGMENTATION MAMMAPLASTY Bilateral 1982   BREAST ENHANCEMENT SURGERY  1981   CARPAL TUNNEL RELEASE  11/11/2011   Procedure: CARPAL TUNNEL RELEASE;  Surgeon: [name], MD;  Location: [facility];  Service: Neurosurgery;  Laterality: Right;  Right Median Nerve Decompression   CARPAL TUNNEL RELEASE  2013   COLONOSCOPY WITH PROPOFOL N/A 11/01/2017   Procedure: COLONOSCOPY WITH PROPOFOL;  Surgeon: [name], MD;  Location: [facility] ENDOSCOPY;  Service: Endoscopy;  Laterality: N/A;   ESOPHAGOGASTRODUODENOSCOPY     JOINT REPLACEMENT  2022   LUMBAR LAMINECTOMY/DECOMPRESSION MICRODISCECTOMY N/A 08/15/2019   Procedure: Lumbar three to Sacral one Decompressive lumbar laminectomy;  Surgeon: [name], MD;  [facility];  Service: Neurosurgery;  Laterality: N/A;   neck disc surgery     plates and screws in neck x 2   ROTATOR CUFF REPAIR  06/16/2016   right shoulder    ROTATOR CUFF REPAIR     SPINAL CORD STIMULATOR INSERTION N/A 03/17/2021   Procedure: THORACIC SPINAL CORD STIMULATOR (PERCUTANEOUS) & PULSE GENERATOR PLACEMENT;  Surgeon: [name], MD;  Location: [facility] ORS;  Service: Neurosurgery;  Laterality: N/A;   SPINE SURGERY  2020   TONSILLECTOMY AND ADENOIDECTOMY  1973   TOTAL HIP ARTHROPLASTY Right 04/15/2021   Procedure: TOTAL HIP ARTHROPLASTY ANTERIOR APPROACH;  Surgeon: [name], MD;  Location: [facility] ORS;  Service: Orthopedics;  Laterality: Right;    Family  Psychiatric History: I have reviewed family psychiatric history from progress note on 12/26/2021.  Family History:  Family History  Problem Relation Age of Onset   Alcohol abuse Mother    Bipolar disorder Mother    Suicidality Mother    Arthritis Mother    Early death Mother    Pancreatic cancer Father    Alcohol abuse Father    Cancer Father    Drug abuse Sister    Alcohol abuse Sister    Depression Sister    Alcohol abuse Sister    Anxiety disorder Sister    Anesthesia problems Neg Hx    Breast cancer Neg Hx     Social History: I have reviewed social history from progress note on 12/26/2021. Social History   Socioeconomic History   Marital status: Married    Spouse name: jerry   Number of children: 0   Years of education: Not on file  Highest education level: Bachelor's degree (e.g., BA, AB, BS)  Occupational History   Not on file  Tobacco Use   Smoking status: Former    Current packs/day: 0.00    Average packs/day: 2.0 packs/day for 45.0 years (90.0 ttl pk-yrs)    Types: Cigarettes    Start date: 10/29/1975    Quit date: 10/28/2020    Years since quitting: 2.5   Smokeless tobacco: Never   Tobacco comments:    [name] been quit for about a year now.  Vaping Use   Vaping status: Former   Devices: vape occasionally  Substance and Sexual Activity   Alcohol use: No    Comment: recovering alcoholic Since 1994    Drug use: Never   Sexual activity: Not Currently    Birth control/protection: Abstinence  Other Topics Concern   Not on file  Social History Narrative   Married; full time; does not get regular exercise.    Social Determinants of Health   Financial Resource Strain: Medium Risk (01/31/2023)   Overall Financial Resource Strain (CARDIA)    Difficulty of Paying Living Expenses: Somewhat hard  Food Insecurity: No Food Insecurity (01/31/2023)   Hunger Vital Sign    Worried About Running Out of Food in the Last Year: Never true    Ran Out of Food in the Last  Year: Never true  Transportation Needs: No Transportation Needs (01/31/2023)   PRAPARE - Administrator, Civil Service (Medical): No    Lack of Transportation (Non-Medical): No  Physical Activity: Inactive (01/31/2023)   Exercise Vital Sign    Days of Exercise per Week: 0 days    Minutes of Exercise per Session: 30 min  Stress: No Stress Concern Present (01/31/2023)   [employer] of Occupational Health - Occupational Stress Questionnaire    Feeling of Stress : Not at all  Social Connections: Moderately Isolated (01/31/2023)   Social Connection and Isolation Panel [NHANES]    Frequency of Communication with Friends and Family: More than three times a week    Frequency of Social Gatherings with Friends and Family: More than three times a week    Attends Religious Services: Never    Database administrator or Organizations: No    Attends Banker Meetings: Never    Marital Status: Married    Allergies:  Allergies  Allergen Reactions   Sulfasalazine Other (See Comments)   Plaquenil [Hydroxychloroquine] Rash    Metabolic Disorder Labs: Lab Results  Component Value Date   HGBA1C 5.3 11/23/2022   No results found for: "PROLACTIN" Lab Results  Component Value Date   CHOL 170 11/23/2022   TRIG 196 (H) 11/23/2022   HDL 59 11/23/2022   CHOLHDL 2.9 11/23/2022   LDLCALC 78 11/23/2022   LDLCALC 79 11/17/2021   Lab Results  Component Value Date   TSH 3.673 03/10/2022   TSH 2.170 11/17/2021    Therapeutic Level Labs: No results found for: "LITHIUM" No results found for: "VALPROATE" No results found for: "CBMZ"  Current Medications: Current Outpatient Medications  Medication Sig Dispense Refill   hydrOXYzine (ATARAX) 10 MG tablet Take 1-2 tablets (10-20 mg total) by mouth 2 (two) times daily as needed. For severe anxiety attacks only 120 tablet 1   acyclovir ointment (ZOVIRAX) 5 % :Topical Every 3 Hours as needed for lesion 30 g 1   albuterol  (PROVENTIL) (2.5 MG/3ML) 0.083% nebulizer solution Take 3 mLs (2.5 mg total) by nebulization every 4 (four) hours as needed  for wheezing or shortness of breath. 225 mL 2   albuterol (VENTOLIN HFA) 108 (90 Base) MCG/ACT inhaler INHALE TWO (2) PUFFS BY MOUTH AND INTO THE LUNGS EVERY 6 HOURS AS NEEDED FOR SHORTNESS OF BREATH 8.5 g 10   atorvastatin (LIPITOR) 40 MG tablet Take 1 tablet (40 mg total) by mouth daily. 90 tablet 3   Budeson-Glycopyrrol-Formoterol (BREZTRI AEROSPHERE) 160-9-4.8 MCG/ACT AERO Inhale 2 puffs into the lungs in the morning and at bedtime. 10.7 g 11   carvedilol (COREG) 12.5 MG tablet TAKE ONE TABLET BY MOUTH AT BREAKFAST AND AT BEDTIME 180 tablet 3   ezetimibe (ZETIA) 10 MG tablet Take 1 tablet (10 mg total) by mouth daily. 90 tablet 3   fexofenadine (ALLEGRA) 180 MG tablet Take 180 mg by mouth daily.     furosemide (LASIX) 20 MG tablet TAKE ONE TABLET BY MOUTH daily 90 tablet 0   gabapentin (NEURONTIN) 400 MG capsule Take 1 capsule (400 mg total) by mouth at bedtime. 30 capsule 5   gentamicin cream (GARAMYCIN) 0.1 % Apply to affected toes once daily. Cover with fabric band-aid. 15 g 0   ipratropium (ATROVENT) 0.03 % nasal spray Place 2 sprays into both nostrils every 12 (twelve) hours. 30 mL 12   leflunomide (ARAVA) 20 MG tablet Take 20 mg by mouth at bedtime.     LINZESS 145 MCG CAPS capsule Take 145 mcg by mouth daily before breakfast.     losartan (COZAAR) 25 MG tablet TAKE ONE TABLET BY MOUTH EVERYDAY AT BEDTIME 90 tablet 1   meloxicam (MOBIC) 15 MG tablet Take 15 mg by mouth at bedtime.     mirtazapine (REMERON) 15 MG tablet TAKE ONE TABLET BY MOUTH EVERYDAY AT BEDTIME 90 tablet 0   montelukast (SINGULAIR) 10 MG tablet TAKE ONE TABLET BY MOUTH EVERYDAY AT BEDTIME 30 tablet 11   Multiple Vitamin (MULTIVITAMIN WITH MINERALS) TABS tablet Take 1 tablet by mouth daily.     Nebulizer MISC Compressor nebulizer to use with nebulized medications as instructed 1 each 0   omeprazole  (PRILOSEC) 40 MG capsule TAKE ONE CAPSULE BY MOUTH AT BREAKFAST AND AT BEDTIME 180 capsule 1   [START ON 06/28/2023] oxyCODONE-acetaminophen (PERCOCET) 5-325 MG tablet Take 1 tablet by mouth every 12 (twelve) hours as needed for severe pain. Must last 30 days. 60 tablet 0   oxyCODONE-acetaminophen (PERCOCET) 5-325 MG tablet Take 1 tablet by mouth every 12 (twelve) hours as needed for severe pain. Must last 30 days. 60 tablet 0   potassium chloride SA (KLOR-CON M) 20 MEQ tablet TAKE 1 TABLET BY MOUTH EVERY MORNING 90 tablet 1   topiramate (TOPAMAX) 25 MG tablet Take 1 tablet (25 mg total) by mouth 2 (two) times daily. 60 tablet 11   valACYclovir (VALTREX) 1000 MG tablet TAKE TWO TABLETS BY MOUTH TWICE DAILY AS NEEDED FOR 5 DAYS FOR FEVER blisters 30 tablet 1   venlafaxine XR (EFFEXOR-XR) 75 MG 24 hr capsule TAKE THREE CAPSULES BY MOUTH EVERY MORNING 270 capsule 0   XIIDRA 5 % SOLN Place 1 drop into both eyes daily.     No current facility-administered medications for this visit.     Musculoskeletal: Strength & Muscle Tone:  UTA Gait & Station:  UTA Patient leans: N/A  Psychiatric Specialty Exam: Review of Systems  Psychiatric/Behavioral:  The patient is nervous/anxious.     There were no vitals taken for this visit.There is no height or weight on file to calculate BMI.  General Appearance:  UTA  Eye Contact:   UTA  Speech:  Clear and Coherent  Volume:  Normal  Mood:  Anxious  Affect:   UTA  Thought Process:  Goal Directed and Descriptions of Associations: Intact  Orientation:  Full (Time, Place, and Person)  Thought Content: Logical   Suicidal Thoughts:  No  Homicidal Thoughts:  No  Memory:  Immediate;   Fair Recent;   Fair Remote;   Fair  Judgement:  Fair  Insight:  Fair  Psychomotor Activity:   UTA  Concentration:  Concentration: Fair and Attention Span: Fair  Recall:  [employer] of Knowledge: Fair  Language: Fair  Akathisia:  No  Handed:  Right  AIMS (if indicated):  not done  Assets:  Communication Skills Housing Social Support Transportation  ADL's:  Intact  Cognition: WNL  Sleep:  Fair   Screenings: AIMS    Flowsheet Row Video Visit from 08/13/2022 in [facility] Office Visit from 05/13/2022 in [facility] Office Visit from 03/31/2022 in [facility] Office Visit from 01/22/2022 in [facility] Office Visit from 12/26/2021 in [facility]  AIMS Total Score 0 0 0 0 0      GAD-7    Flowsheet Row Video Visit from 12/10/2022 in [facility] Office Visit from 05/13/2022 in [facility] Office Visit from 03/31/2022 in [facility] Office Visit from 01/22/2022 in [facility] Office Visit from 12/26/2021 in [facility]  Total GAD-7 Score 0 0 2 0 0      PHQ2-9    Flowsheet Row Video Visit from 05/12/2023 in [facility] Most recent reading at 05/12/2023  4:45 PM Office Visit from 02/02/2023 in [facility] Most recent reading at 02/02/2023  9:21 AM Pulmonary Rehab from 12/10/2022 in [facility] Most recent reading at 12/10/2022  2:13 PM Video Visit from 12/10/2022 in [facility] Most recent reading at 12/10/2022  1:10 PM Office Visit from 11/23/2022 in [facility] Most recent reading at 11/23/2022 10:02 AM  PHQ-2 Total Score 0 0 0 0 0  PHQ-9 Total Score -- 1 0 0 0      Flowsheet Row Video Visit from 05/12/2023 in [facility] Video Visit from 03/02/2023 in [facility] Video Visit from 12/10/2022 in [facility]  C-SSRS RISK CATEGORY No Risk No Risk Low Risk        Assessment and Plan: Denay Pleitez Jarrard is a 67 year old Caucasian female, married, on disability, lives in [place], has a history of GAD, MDD, sleep problems, COPD, multiple other medical problems with recent worsening of anxiety, will benefit from the following plan.  Plan MDD in full remission Venlafaxine 225 mg p.o. daily with breakfast   GAD-unstable Continue venlafaxine 225 mg p.o. daily Patient currently reports anxiety attacks,  will start hydroxyzine 10 to 20 mg twice a day as needed for severe anxiety attacks Will refer for CBT-provided information for Ms. Phyllis Ginger at [phone].  Primary insomnia-stable Mirtazapine 15 mg p.o. nightly Continue sleep hygiene techniques  Alcohol use disorder in remission Sober since 1994  I have reviewed labs-TSH dated 05/31/2019 24-1.77.  Follow-up in clinic  in 2 to 3 weeks or sooner in person.   Collaboration of Care: Collaboration of Care: Referral or follow-up with counselor/therapist AEB patient encouraged to establish care with a therapist.  Patient/Guardian was advised Release of Information must be obtained prior to any record release in order to collaborate their care with an outside provider. Patient/Guardian was advised if they have not already done so to contact the registration department to sign all necessary forms in order for [place] to release information regarding their care.   Consent: Patient/Guardian gives verbal consent for treatment and assignment of benefits for services provided during this visit. Patient/Guardian expressed understanding and agreed to proceed.    I have spent at least 23 minutes non face to face with patient today.  This note was generated in part or whole with voice recognition software. Voice recognition is usually quite accurate but  there are transcription errors that can and very often do occur. I apologize for any typographical errors that were not detected and corrected.     Jomarie Longs, MD 06/03/2023, 5:26 PM

## 2023-06-02 NOTE — Progress Notes (Signed)
*  PRELIMINARY RESULTS* Echocardiogram 2D Echocardiogram has been performed.  Gabrielle Pineda 06/02/2023, 9:39 AM

## 2023-06-03 ENCOUNTER — Other Ambulatory Visit: Payer: Self-pay | Admitting: Family Medicine

## 2023-06-03 ENCOUNTER — Other Ambulatory Visit: Payer: Self-pay | Admitting: Psychiatry

## 2023-06-03 DIAGNOSIS — F5101 Primary insomnia: Secondary | ICD-10-CM

## 2023-06-03 DIAGNOSIS — R600 Localized edema: Secondary | ICD-10-CM

## 2023-06-03 DIAGNOSIS — F3342 Major depressive disorder, recurrent, in full remission: Secondary | ICD-10-CM

## 2023-06-03 DIAGNOSIS — F411 Generalized anxiety disorder: Secondary | ICD-10-CM

## 2023-06-04 NOTE — Telephone Encounter (Signed)
Requested medication (s) are due for refill today:   Yes  Requested medication (s) are on the active medication list:   Yes  Future visit scheduled:   Yes 12/2 with Dr. Payton Mccallum   Last ordered: 04/14/2023 #90, 0 refills  Unable to refill because labs are due per protocol.   Requested Prescriptions  Pending Prescriptions Disp Refills   furosemide (LASIX) 20 MG tablet [Pharmacy Med Name: FUROSEMIDE 20 MG TABS 20 Tablet] 30 tablet 10    Sig: TAKE 1 TABLET BY MOUTH ONCE DAILY     Cardiovascular:  Diuretics - Loop Failed - 06/03/2023  6:59 PM      Failed - K in normal range and within 180 days    Potassium  Date Value Ref Range Status  09/11/2022 4.1 3.5 - 5.1 mmol/L Final         Failed - Ca in normal range and within 180 days    Calcium  Date Value Ref Range Status  09/11/2022 8.8 (L) 8.9 - 10.3 mg/dL Final   Calcium, Ion  Date Value Ref Range Status  03/17/2021 1.16 1.15 - 1.40 mmol/L Final         Failed - Na in normal range and within 180 days    Sodium  Date Value Ref Range Status  09/11/2022 139 135 - 145 mmol/L Final  11/17/2021 142 134 - 144 mmol/L Final         Failed - Cr in normal range and within 180 days    Creatinine, Ser  Date Value Ref Range Status  09/11/2022 1.00 0.44 - 1.00 mg/dL Final         Failed - Cl in normal range and within 180 days    Chloride  Date Value Ref Range Status  09/11/2022 109 98 - 111 mmol/L Final         Failed - Mg Level in normal range and within 180 days    No results found for: "MG"       Passed - Last BP in normal range    BP Readings from Last 1 Encounters:  05/10/23 110/72         Passed - Valid encounter within last 6 months    Recent Outpatient Visits           4 months ago Essential hypertension   [place] [facility] [name], PA-C   6 months ago Annual physical exam   [facility] [name], PA-C   9 months ago COPD exacerbation [facility])   [name], [facility]-C   12 months ago Acute bronchitis with COPD [facility]   [facility] [name], PA-C   1 year ago Essential hypertension   [place] [facility] [name], PA-C       Future Appointments             In 1 month Pardue, Monico Blitz, DO [facility]

## 2023-06-10 ENCOUNTER — Other Ambulatory Visit: Payer: Self-pay | Admitting: Neurosurgery

## 2023-06-10 DIAGNOSIS — M4712 Other spondylosis with myelopathy, cervical region: Secondary | ICD-10-CM

## 2023-06-13 ENCOUNTER — Ambulatory Visit
Admission: RE | Admit: 2023-06-13 | Discharge: 2023-06-13 | Disposition: A | Discharge status: Home / Self Care | Payer: Medicare Other | Source: Ambulatory Visit | Attending: Neurosurgery | Admitting: Neurosurgery

## 2023-06-13 DIAGNOSIS — M4712 Other spondylosis with myelopathy, cervical region: Secondary | ICD-10-CM

## 2023-06-16 ENCOUNTER — Other Ambulatory Visit: Payer: Self-pay

## 2023-06-16 ENCOUNTER — Telehealth: Payer: Self-pay | Admitting: Student in an Organized Health Care Education/Training Program

## 2023-06-16 DIAGNOSIS — G894 Chronic pain syndrome: Secondary | ICD-10-CM

## 2023-06-16 MED ORDER — OXYCODONE-ACETAMINOPHEN 5-325 MG PO TABS: 1.0000 | ORAL_TABLET | Freq: Two times a day (BID) | ORAL | 0 refills | Status: DC | PRN

## 2023-06-16 MED ORDER — OXYCODONE-ACETAMINOPHEN 5-325 MG PO TABS: 1.0000 | ORAL_TABLET | Freq: Two times a day (BID) | ORAL | 0 refills | Status: AC | PRN

## 2023-06-16 NOTE — Telephone Encounter (Signed)
PT called stated that she was inform that she will need a new prescription to be send in for her oxycodone to be send in. PT stated that the pharmacy can't fill prescription because it has expires. Please give patient and pharmacy a call. TY

## 2023-06-16 NOTE — Telephone Encounter (Signed)
Spoke with the pharmacy and they states there was a problem with the October prescriptiona dn they could not fill it.  States that the one to be filled in November had expired due to it being more than 30 days since it was ordered.  Spoke with the patient and she will change pharmacies. Refill request sent to Dr Cherylann Ratel.

## 2023-06-18 ENCOUNTER — Other Ambulatory Visit: Payer: Self-pay | Admitting: Physician Assistant

## 2023-06-18 DIAGNOSIS — B001 Herpesviral vesicular dermatitis: Secondary | ICD-10-CM

## 2023-06-21 ENCOUNTER — Encounter: Payer: Self-pay | Admitting: Student in an Organized Health Care Education/Training Program

## 2023-06-21 ENCOUNTER — Encounter: Payer: Self-pay | Admitting: Psychiatry

## 2023-06-21 ENCOUNTER — Ambulatory Visit: Payer: Medicare Other | Admitting: Psychiatry

## 2023-06-21 VITALS — BP 117/74 | HR 89 | Temp 97.8°F | Ht 69.0 in | Wt 168.6 lb

## 2023-06-21 DIAGNOSIS — F5101 Primary insomnia: Secondary | ICD-10-CM

## 2023-06-21 DIAGNOSIS — F1021 Alcohol dependence, in remission: Secondary | ICD-10-CM | POA: Diagnosis not present

## 2023-06-21 DIAGNOSIS — F3342 Major depressive disorder, recurrent, in full remission: Secondary | ICD-10-CM | POA: Diagnosis not present

## 2023-06-21 DIAGNOSIS — F411 Generalized anxiety disorder: Secondary | ICD-10-CM | POA: Diagnosis not present

## 2023-06-21 NOTE — Telephone Encounter (Signed)
Requested medications are due for refill today.  unsure  Requested medications are on the active medications list.  no  Last refill. 06/17/2022 #30 1 rf  Future visit scheduled.   yes  Notes to clinic.  Not on med list.    Requested Prescriptions  Pending Prescriptions Disp Refills   valACYclovir (VALTREX) 1000 MG tablet [Pharmacy Med Name: VALACYCLOVIR 1GM TAB 1 Tablet] 30 tablet 10    Sig: TAKE 2 TABLETS BY MOUTH TWICE DAILY AS NEEDED FOR 5 DAYS     Antimicrobials:  Antiviral Agents - Anti-Herpetic Passed - 06/18/2023  6:59 PM      Passed - Valid encounter within last 12 months    Recent Outpatient Visits           4 months ago Essential hypertension   [place] [facility] [name], PA-C   7 months ago Annual physical exam   [name], PA-C   10 months ago COPD exacerbation [facility])   [place] [facility] [name], PA-C   1 year ago Acute bronchitis with COPD [facility])   [place] [facility] [name], PA-C   1 year ago Essential hypertension   [place] [facility] [name], PA-C       Future Appointments             In 4 weeks Pardue, Monico Blitz, DO [place] [facility]

## 2023-06-21 NOTE — Progress Notes (Unsigned)
BH MD OP Progress Note  06/21/2023 1:26 PM [name]  MRN:  [id number]  Chief Complaint:  Chief Complaint  Patient presents with   Follow-up   Anxiety   Depression   Medication Refill   HPI: Gabrielle Pineda is a 67 year old Caucasian female, on disability, lives in [place], has a history of MDD, GAD, primary insomnia, alcohol use disorder in remission, history of back pain, history of spinal cord stimulator, paroxysmal ventricular tachycardia, hypercholesterolemia, chronic pain syndrome, GERD was evaluated in office today.  Patient today reports she is currently improving on the current medication regimen.  Has not had any recent panic attacks.  Overall anxiety symptoms improved since her last visit.  Currently compliant on the venlafaxine, mirtazapine.  Denies side effects.  She is compliant on the hydroxyzine as needed.  Denies side effects.  Patient reports she has upcoming appointment scheduled with Ms. [name] for CBT, motivated to keep it.  Patient denies any suicidality, homicidality or perceptual disturbances.  Patient appeared to be alert, oriented to person place time and situation.  Remote memory immediate 3 out of 3, after 5 minutes 3 out of 3.  Patient was able to spell the word 'WORLD' forward and backward.  Attention and focus seem to be good.  Patient denies any other concerns today.  Visit Diagnosis:    ICD-10-CM   1. Primary insomnia  F51.01     2. MDD (major depressive disorder), recurrent, in full remission (HCC)  F33.42     3. GAD (generalized anxiety disorder)  F41.1     4. Alcohol use disorder, moderate, in sustained remission (HCC)  F10.21       Past Psychiatric History: I have reviewed past psychiatric history from progress note on 12/26/2021.  Trials of medications like sertraline, multiple other medications-does not remember all the names.  Past Medical History:  Past Medical History:  Diagnosis Date   Alcohol abuse    Allergy 1983    Anemia    Anxiety    Arthritis    Cataract    Cervicalgia    Cirrhosis (HCC) 1994   COPD (chronic obstructive pulmonary disease) (HCC)    Depression    Difficult intubation    has plates and screws in neck   Dyspnea    GERD (gastroesophageal reflux disease)    Headache    Heart murmur    on heard when pt is lying   Hypertension    Neuromuscular disorder (HCC)    Neuropathy   Other and unspecified hyperlipidemia    Oxygen deficiency    Status post insertion of spinal cord stimulator    Tachycardia    d/t questionable anxiety happens every 5- 10 years, sees [name]    Past Surgical History:  Procedure Laterality Date   ANTERIOR CERVICAL DECOMP/DISCECTOMY FUSION  2012, 2015, 2018   x3   AUGMENTATION MAMMAPLASTY Bilateral 1982   BREAST ENHANCEMENT SURGERY  1981   CARPAL TUNNEL RELEASE  11/11/2011   Procedure: CARPAL TUNNEL RELEASE;  Surgeon: [name], MD;  Location: [facility];  Service: Neurosurgery;  Laterality: Right;  Right Median Nerve Decompression   CARPAL TUNNEL RELEASE  2013   COLONOSCOPY WITH PROPOFOL N/A 11/01/2017   Procedure: COLONOSCOPY WITH PROPOFOL;  Surgeon: [name], MD;  Location: [facility] ENDOSCOPY;  Service: Endoscopy;  Laterality: N/A;   ESOPHAGOGASTRODUODENOSCOPY     JOINT REPLACEMENT  2022   LUMBAR LAMINECTOMY/DECOMPRESSION MICRODISCECTOMY N/A 08/15/2019   Procedure: Lumbar three to Sacral  one Decompressive lumbar laminectomy;  Surgeon: [name], MD;  [facility];  Service: Neurosurgery;  Laterality: N/A;   neck disc surgery     plates and screws in neck x 2   ROTATOR CUFF REPAIR  06/16/2016   right shoulder    ROTATOR CUFF REPAIR     SPINAL CORD STIMULATOR INSERTION N/A 03/17/2021   Procedure: THORACIC SPINAL CORD STIMULATOR (PERCUTANEOUS) & PULSE GENERATOR PLACEMENT;  Surgeon: [name], MD;  Location: [facility] ORS;  Service: Neurosurgery;  Laterality: N/A;   SPINE SURGERY  2020   TONSILLECTOMY AND ADENOIDECTOMY   1973   TOTAL HIP ARTHROPLASTY Right 04/15/2021   Procedure: TOTAL HIP ARTHROPLASTY ANTERIOR APPROACH;  Surgeon: [name], MD;  Location: [facility] ORS;  Service: Orthopedics;  Laterality: Right;    Family Psychiatric History: I have reviewed family psychiatric history from progress note on 12/26/2021.  Family History:  Family History  Problem Relation Age of Onset   Alcohol abuse Mother    Bipolar disorder Mother    Suicidality Mother    Arthritis Mother    Early death Mother    Pancreatic cancer Father    Alcohol abuse Father    Cancer Father    Drug abuse Sister    Alcohol abuse Sister    Depression Sister    Alcohol abuse Sister    Anxiety disorder Sister    Anesthesia problems Neg Hx    Breast cancer Neg Hx     Social History: Reviewed social history from progress note on 12/26/2021. Social History   Socioeconomic History   Marital status: Married    Spouse name: Gabrielle Pineda   Number of children: 0   Years of education: Not on file   Highest education level: Bachelor's degree (e.g., BA, AB, BS)  Occupational History   Not on file  Tobacco Use   Smoking status: Former    Current packs/day: 0.00    Average packs/day: 2.0 packs/day for 45.0 years (90.0 ttl pk-yrs)    Types: Cigarettes    Start date: 10/29/1975    Quit date: 10/28/2020    Years since quitting: 2.6   Smokeless tobacco: Never   Tobacco comments:    [name] been quit for about a year now.  Vaping Use   Vaping status: Former   Devices: vape occasionally  Substance and Sexual Activity   Alcohol use: No    Comment: recovering alcoholic Since 1994    Drug use: Never   Sexual activity: Not Currently    Birth control/protection: Abstinence  Other Topics Concern   Not on file  Social History Narrative   Married; full time; does not get regular exercise.    Social Determinants of Health   Financial Resource Strain: Medium Risk (01/31/2023)   Overall Financial Resource Strain (CARDIA)    Difficulty of Paying  Living Expenses: Somewhat hard  Food Insecurity: No Food Insecurity (01/31/2023)   Hunger Vital Sign    Worried About Running Out of Food in the Last Year: Never true    Ran Out of Food in the Last Year: Never true  Transportation Needs: No Transportation Needs (01/31/2023)   PRAPARE - Administrator, Civil Service (Medical): No    Lack of Transportation (Non-Medical): No  Physical Activity: Inactive (01/31/2023)   Exercise Vital Sign    Days of Exercise per Week: 0 days    Minutes of Exercise per Session: 30 min  Stress: No Stress Concern Present (01/31/2023)   [employer] of Occupational  Health - Occupational Stress Questionnaire    Feeling of Stress : Not at all  Social Connections: Moderately Isolated (01/31/2023)   Social Connection and Isolation Panel [NHANES]    Frequency of Communication with Friends and Family: More than three times a week    Frequency of Social Gatherings with Friends and Family: More than three times a week    Attends Religious Services: Never    Database administrator or Organizations: No    Attends Banker Meetings: Never    Marital Status: Married    Allergies:  Allergies  Allergen Reactions   Sulfasalazine Other (See Comments)   Plaquenil [Hydroxychloroquine] Rash    Metabolic Disorder Labs: Lab Results  Component Value Date   HGBA1C 5.3 11/23/2022   No results found for: "PROLACTIN" Lab Results  Component Value Date   CHOL 170 11/23/2022   TRIG 196 (H) 11/23/2022   HDL 59 11/23/2022   CHOLHDL 2.9 11/23/2022   LDLCALC 78 11/23/2022   LDLCALC 79 11/17/2021   Lab Results  Component Value Date   TSH 3.673 03/10/2022   TSH 2.170 11/17/2021    Therapeutic Level Labs: No results found for: "LITHIUM" No results found for: "VALPROATE" No results found for: "CBMZ"  Current Medications: Current Outpatient Medications  Medication Sig Dispense Refill   acyclovir ointment (ZOVIRAX) 5 % :Topical Every 3 Hours as  needed for lesion 30 g 1   albuterol (PROVENTIL) (2.5 MG/3ML) 0.083% nebulizer solution Take 3 mLs (2.5 mg total) by nebulization every 4 (four) hours as needed for wheezing or shortness of breath. 225 mL 2   albuterol (VENTOLIN HFA) 108 (90 Base) MCG/ACT inhaler INHALE TWO (2) PUFFS BY MOUTH AND INTO THE LUNGS EVERY 6 HOURS AS NEEDED FOR SHORTNESS OF BREATH 8.5 g 10   atorvastatin (LIPITOR) 40 MG tablet Take 1 tablet (40 mg total) by mouth daily. 90 tablet 3   Budeson-Glycopyrrol-Formoterol (BREZTRI AEROSPHERE) 160-9-4.8 MCG/ACT AERO Inhale 2 puffs into the lungs in the morning and at bedtime. 10.7 g 11   carvedilol (COREG) 12.5 MG tablet TAKE ONE TABLET BY MOUTH AT BREAKFAST AND AT BEDTIME 180 tablet 3   ezetimibe (ZETIA) 10 MG tablet Take 1 tablet (10 mg total) by mouth daily. 90 tablet 3   fexofenadine (ALLEGRA) 180 MG tablet Take 180 mg by mouth daily.     furosemide (LASIX) 20 MG tablet TAKE 1 TABLET BY MOUTH ONCE DAILY 30 tablet 2   gabapentin (NEURONTIN) 400 MG capsule Take 1 capsule (400 mg total) by mouth at bedtime. 30 capsule 5   gentamicin cream (GARAMYCIN) 0.1 % Apply to affected toes once daily. Cover with fabric band-aid. 15 g 0   hydrOXYzine (ATARAX) 10 MG tablet Take 1-2 tablets (10-20 mg total) by mouth 2 (two) times daily as needed. For severe anxiety attacks only 120 tablet 1   ipratropium (ATROVENT) 0.03 % nasal spray Place 2 sprays into both nostrils every 12 (twelve) hours. 30 mL 12   leflunomide (ARAVA) 20 MG tablet Take 20 mg by mouth at bedtime.     LINZESS 145 MCG CAPS capsule Take 145 mcg by mouth daily before breakfast.     losartan (COZAAR) 25 MG tablet TAKE ONE TABLET BY MOUTH EVERYDAY AT BEDTIME 90 tablet 1   meloxicam (MOBIC) 15 MG tablet Take 15 mg by mouth at bedtime.     mirtazapine (REMERON) 15 MG tablet TAKE 1 TABLET BY MOUTH EVERY DAY AT BEDTIME 30 tablet 1   montelukast (  SINGULAIR) 10 MG tablet TAKE ONE TABLET BY MOUTH EVERYDAY AT BEDTIME 30 tablet 11    Multiple Vitamin (MULTIVITAMIN WITH MINERALS) TABS tablet Take 1 tablet by mouth daily.     Nebulizer MISC Compressor nebulizer to use with nebulized medications as instructed 1 each 0   omeprazole (PRILOSEC) 40 MG capsule TAKE ONE CAPSULE BY MOUTH AT BREAKFAST AND AT BEDTIME 180 capsule 1   [START ON 06/28/2023] oxyCODONE-acetaminophen (PERCOCET) 5-325 MG tablet Take 1 tablet by mouth every 12 (twelve) hours as needed for severe pain (pain score 7-10). Must last 30 days. 60 tablet 0   oxyCODONE-acetaminophen (PERCOCET) 5-325 MG tablet Take 1 tablet by mouth every 12 (twelve) hours as needed for severe pain (pain score 7-10). Must last 30 days. 60 tablet 0   potassium chloride SA (KLOR-CON M) 20 MEQ tablet TAKE 1 TABLET BY MOUTH EVERY MORNING 90 tablet 1   topiramate (TOPAMAX) 25 MG tablet Take 1 tablet (25 mg total) by mouth 2 (two) times daily. 60 tablet 11   valACYclovir (VALTREX) 1000 MG tablet TAKE 2 TABLETS BY MOUTH TWICE DAILY AS NEEDED FOR 5 DAYS 30 tablet 1   venlafaxine XR (EFFEXOR-XR) 75 MG 24 hr capsule TAKE (3) CAPSULES BY MOUTH EVERY MORNING 90 capsule 10   XIIDRA 5 % SOLN Place 1 drop into both eyes daily.     Atogepant 60 MG TABS Take by mouth. (Patient not taking: Reported on 06/21/2023)     No current facility-administered medications for this visit.     Musculoskeletal: Strength & Muscle Tone: within normal limits Gait & Station: normal Patient leans: N/A  Psychiatric Specialty Exam: Review of Systems  Psychiatric/Behavioral: Negative.      There were no vitals taken for this visit.There is no height or weight on file to calculate BMI.  General Appearance: Fairly Groomed  Eye Contact:  Good  Speech:  Clear and Coherent  Volume:  Normal  Mood:  Euthymic  Affect:  Appropriate  Thought Process:  Goal Directed and Descriptions of Associations: Intact  Orientation:  Full (Time, Place, and Person)  Thought Content: Logical   Suicidal Thoughts:  No  Homicidal Thoughts:   No  Memory:  Immediate;   Fair Recent;   Fair Remote;   Fair  Judgement:  Fair  Insight:  Fair  Psychomotor Activity:  Normal  Concentration:  Concentration: Fair and Attention Span: Fair  Recall:  [employer] of Knowledge: Fair  Language: Fair  Akathisia:  No  Handed:  Right  AIMS (if indicated): not done  Assets:  Communication Skills Desire for Improvement Housing Social Support  ADL's:  Intact  Cognition: WNL  Sleep:  Fair   Screenings: AIMS    Flowsheet Row Video Visit from 08/13/2022 in [facility] Office Visit from 05/13/2022 in [facility] Office Visit from 03/31/2022 in [facility] Office Visit from 01/22/2022 in [facility] Office Visit from 12/26/2021 in [facility]  AIMS Total Score 0 0 0 0 0      GAD-7    Flowsheet Row Office Visit from 06/21/2023 in [facility] Video Visit from 12/10/2022 in [facility] Office Visit from 05/13/2022 in [facility] Office Visit from 03/31/2022 in [facility] Office Visit from 01/22/2022 in [facility]  Total  GAD-7 Score 1 0 0 2 0      PHQ2-9    Flowsheet Row Office Visit from 06/21/2023 in [facility] Most recent reading at 06/21/2023  1:18 PM Video Visit from 05/12/2023 in [facility] Most recent reading at 05/12/2023  4:45 PM Office Visit from 02/02/2023 in [facility] Most recent reading at 02/02/2023  9:21 AM Pulmonary Rehab from 12/10/2022 in [facility] Most recent reading at 12/10/2022  2:13 PM Video Visit from  12/10/2022 in [facility] Most recent reading at 12/10/2022  1:10 PM  PHQ-2 Total Score 0 0 0 0 0  PHQ-9 Total Score -- -- 1 0 0      Flowsheet Row Office Visit from 06/21/2023 in [facility] Video Visit from 05/12/2023 in [facility] Video Visit from 03/02/2023 in [facility]  C-SSRS RISK CATEGORY No Risk No Risk No Risk        Assessment and Plan: DEVI HOPMAN is a 67 year old Caucasian female, married, on disability, lives in [place], has a history of GAD, MDD, sleep problems, COPD, multiple other medical problems was evaluated in office today.  Patient is currently improving, plan as noted below.  Plan MDD in full remission Venlafaxine 225 mg p.o. daily  GAD-improving Venlafaxine 225 mg p.o. daily Hydroxyzine 10 -20 mg p.o. twice daily as needed for severe anxiety attacks Patient referred for CBT-Ms. Nedra Hai appointment on December 4.  Patient encouraged to keep it.  Primary insomnia-stable Mirtazapine 15 mg p.o. nightly Continue sleep hygiene techniques.  Alcohol use disorder in remission Sober since 1994.  Follow-up in clinic in 3 months or sooner if needed.   Collaboration of Care: Collaboration of Care: Referral or follow-up with counselor/therapist AEB patient encouraged to keep the appointment with therapist.  Patient/Guardian was advised Release of Information must be obtained prior to any record release in order to collaborate their care with an outside provider. Patient/Guardian was advised if they have not already done so to contact the registration department to sign all necessary forms in order for [place] to release information regarding their care.   Consent: Patient/Guardian gives verbal consent for treatment and assignment of benefits for services provided during this visit. Patient/Guardian  expressed understanding and agreed to proceed.  This note was generated in part or whole with voice recognition software. Voice recognition is usually quite accurate but there are transcription errors that can and very often do occur. I apologize for any typographical errors that were not detected and corrected.     Jomarie Longs, MD 06/21/2023, 1:26 PM

## 2023-06-22 NOTE — Telephone Encounter (Signed)
[facility] [employer]

## 2023-06-24 ENCOUNTER — Ambulatory Visit: Payer: Medicare Other

## 2023-06-27 ENCOUNTER — Ambulatory Visit: Payer: Medicare Other

## 2023-06-28 ENCOUNTER — Encounter: Payer: Self-pay | Admitting: Student in an Organized Health Care Education/Training Program

## 2023-06-28 ENCOUNTER — Telehealth: Payer: Self-pay | Admitting: Pharmacist

## 2023-06-28 NOTE — Progress Notes (Signed)
   Outreach Note  06/28/2023 Name: Gabrielle Pineda MRN: [id number] DOB: Jan 07, 1956  Referred by: Sherlyn Hay, DO  Outreach to patient today to reschedule upcoming appointment scheduled for 06/30/2023. Was unable to reach patient via telephone today and have left HIPAA compliant voicemail asking patient to return my call.    Follow Up Plan: Will collaborate with Care Guide to outreach to schedule follow up with me  Estelle Grumbles, PharmD, [facility] [phone]

## 2023-06-29 NOTE — Telephone Encounter (Signed)
Patient informed that Rx for Oxycodone/Acet is already at [facility].

## 2023-06-30 ENCOUNTER — Other Ambulatory Visit: Payer: Medicare Other | Admitting: Pharmacist

## 2023-07-01 NOTE — Telephone Encounter (Signed)
Delisia,             Our national database shows that you filled it by mail on 06/09/2023 via [facility] out of [place].

## 2023-07-05 ENCOUNTER — Telehealth: Payer: Self-pay

## 2023-07-05 NOTE — Progress Notes (Signed)
  Care Coordination Note  07/05/2023 Name: Gabrielle Pineda MRN: [id number] DOB: 1955-11-24  Gabrielle Pineda is a 67 y.o. year old female who is a primary care patient of Pardue, Monico Blitz, DO and is actively engaged with the Chronic Care Management team. I reached out to Gabrielle Pineda by phone today to assist with re-scheduling a follow up visit with the Pharmacist  Follow up plan: Unsuccessful telephone outreach attempt made. A HIPAA compliant phone message was left for the patient providing contact information and requesting a return call.   Penne Lash , RMA     [facility]  [facility] Guide  Direct Dial: [phone]  Website: [email or web address]

## 2023-07-06 ENCOUNTER — Other Ambulatory Visit: Payer: Self-pay | Admitting: Psychiatry

## 2023-07-06 DIAGNOSIS — F411 Generalized anxiety disorder: Secondary | ICD-10-CM

## 2023-07-09 ENCOUNTER — Ambulatory Visit: Payer: Medicare Other | Admitting: Podiatry

## 2023-07-09 ENCOUNTER — Encounter: Payer: Self-pay | Admitting: Podiatry

## 2023-07-09 VITALS — Ht 69.0 in | Wt 168.6 lb

## 2023-07-09 DIAGNOSIS — B351 Tinea unguium: Secondary | ICD-10-CM | POA: Diagnosis not present

## 2023-07-09 DIAGNOSIS — G629 Polyneuropathy, unspecified: Secondary | ICD-10-CM | POA: Diagnosis not present

## 2023-07-09 DIAGNOSIS — L84 Corns and callosities: Secondary | ICD-10-CM | POA: Diagnosis not present

## 2023-07-09 DIAGNOSIS — M79674 Pain in right toe(s): Secondary | ICD-10-CM

## 2023-07-09 DIAGNOSIS — M79675 Pain in left toe(s): Secondary | ICD-10-CM | POA: Diagnosis not present

## 2023-07-09 NOTE — Progress Notes (Signed)
  Subjective:  Patient ID: Gabrielle Pineda, female    DOB: May 17, 1956,  MRN: [id number]  67 y.o. female presents to clinic with  at risk foot care with history of peripheral neuropathy and corn(s) left foot and painful thick toenails that are difficult to trim. Painful toenails interfere with ambulation. Aggravating factors include wearing enclosed shoe gear. Pain is relieved with periodic professional debridement. Painful corns are aggravated when weightbearing when wearing enclosed shoe gear. Pain is relieved with periodic professional debridement.  Chief Complaint  Patient presents with   Nail Problem    Pt is here for RFC, not a diabetic, PCP is Dr Payton Mccallum and LOV was 6 months ago.    New problem(s): None   PCP is Sherlyn Hay, DO.  Allergies  Allergen Reactions   Sulfasalazine Other (See Comments)   Plaquenil [Hydroxychloroquine] Rash    Review of Systems: Negative except as noted in the HPI.   Objective:  Gabrielle Pineda is a pleasant 67 y.o. female WD, WN in NAD. AAO x 3.  Vascular Examination: Vascular status intact b/l with palpable pedal pulses. CFT immediate b/l. No edema. No pain with calf compression b/l. Skin temperature gradient WNL b/l. Pedal hair absent. No varicosities b/l LE. No cyanosis or clubbing noted b/l LE.  Neurological Examination: Sensation grossly intact b/l with 10 gram monofilament. Vibratory sensation intact b/l. Pt has subjective symptoms of neuropathy.  Dermatological Examination: Pedal skin thin and atrophic b/l LE. No open wounds b/l LE. No interdigital macerations noted b/l LE. Toenails 1-5 bilaterally elongated, discolored, dystrophic, thickened, and crumbly with subungual debris and tenderness to dorsal palpation. Hyperkeratotic lesion(s) distal tip of left 3rd toe.  No erythema, no edema, no drainage, no fluctuance.  Musculoskeletal Examination: Muscle strength 5/5 to b/l LE. HAV with bunion deformity noted b/l LE. Hammertoe deformity  noted 2-5 b/l.  Radiographs: None  Last A1c:      Latest Ref Rng & Units 11/23/2022   10:27 AM  Hemoglobin A1C  Hemoglobin-A1c 4.8 - 5.6 % 5.3    Assessment:   1. Pain due to onychomycosis of toenails of both feet   2. Clavi   3. Neuropathy    Plan:  -Consent given for treatment as described below: -Examined patient. -Mycotic toenails 1-5 bilaterally were debrided in length and girth with sterile nail nippers and dremel without incident. -Corn(s) L 3rd toe pared utilizing sterile scalpel blade without complication or incident. Total number debrided=1. -Continue toe separator to left 1st webspace daily. -Patient/POA to call should there be question/concern in the interim.  Return in about 3 months (around 10/09/2023).  Freddie Breech, DPM      [place] LOCATION: [address], [place] [address]                   Office [phone]   [facility] LOCATION: [address] [place], [place] [address] Office [phone]

## 2023-07-12 ENCOUNTER — Encounter: Payer: Self-pay | Admitting: Gastroenterology

## 2023-07-17 ENCOUNTER — Encounter: Payer: Self-pay | Admitting: Podiatry

## 2023-07-19 ENCOUNTER — Encounter: Payer: Self-pay | Admitting: Family Medicine

## 2023-07-19 ENCOUNTER — Ambulatory Visit: Payer: Medicare Other | Admitting: Family Medicine

## 2023-07-19 VITALS — BP 123/65 | HR 66 | Temp 98.8°F | Ht 69.0 in | Wt 168.0 lb

## 2023-07-19 DIAGNOSIS — I050 Rheumatic mitral stenosis: Secondary | ICD-10-CM

## 2023-07-19 DIAGNOSIS — M5416 Radiculopathy, lumbar region: Secondary | ICD-10-CM | POA: Diagnosis not present

## 2023-07-19 DIAGNOSIS — F17211 Nicotine dependence, cigarettes, in remission: Secondary | ICD-10-CM

## 2023-07-19 DIAGNOSIS — D539 Nutritional anemia, unspecified: Secondary | ICD-10-CM | POA: Diagnosis not present

## 2023-07-19 DIAGNOSIS — D509 Iron deficiency anemia, unspecified: Secondary | ICD-10-CM

## 2023-07-19 DIAGNOSIS — B001 Herpesviral vesicular dermatitis: Secondary | ICD-10-CM

## 2023-07-19 DIAGNOSIS — F5101 Primary insomnia: Secondary | ICD-10-CM

## 2023-07-19 DIAGNOSIS — I10 Essential (primary) hypertension: Secondary | ICD-10-CM | POA: Diagnosis not present

## 2023-07-19 DIAGNOSIS — G8929 Other chronic pain: Secondary | ICD-10-CM

## 2023-07-19 DIAGNOSIS — Z79899 Other long term (current) drug therapy: Secondary | ICD-10-CM

## 2023-07-19 DIAGNOSIS — J069 Acute upper respiratory infection, unspecified: Secondary | ICD-10-CM | POA: Diagnosis not present

## 2023-07-19 DIAGNOSIS — Z09 Encounter for follow-up examination after completed treatment for conditions other than malignant neoplasm: Secondary | ICD-10-CM | POA: Diagnosis not present

## 2023-07-19 MED ORDER — VALACYCLOVIR HCL 1 G PO TABS: 2000.0000 mg | ORAL_TABLET | Freq: Every day | ORAL | 1 refills | Status: DC

## 2023-07-19 MED ORDER — ACYCLOVIR 5 % EX OINT: TOPICAL_OINTMENT | CUTANEOUS | 1 refills | Status: AC

## 2023-07-19 NOTE — Assessment & Plan Note (Addendum)
Blood pressure is well-controlled in the clinic (123/65) but home readings are higher (170s/80s). No symptoms of chest pain, dyspnea, dizziness, or edema. Non-adherence to Lasix and irregular self-monitoring. Discussed importance of regular monitoring and calibration of home blood pressure cuff. - Bring home blood pressure cuff to next visit for calibration - Check blood pressure daily for two weeks and send readings via MyChart - Continue current medications including Lasix as needed - uACR WNL 02/24/2023

## 2023-07-19 NOTE — Progress Notes (Signed)
Established patient visit   Patient: Gabrielle Pineda   DOB: 1955/12/02   67 y.o. Female  MRN: [id number] Visit Date: 07/19/2023  Today's healthcare provider: Sherlyn Hay, DO   Chief Complaint  Patient presents with   Hypertension   Subjective    HPI Last annual exam 11/23/2022 with Gabrielle Ferguson, PA-C Last visit: 02/02/2023  PHQ9 score today: 0  Patient presents for 6 month follow up. Her readings at home have been running high per the patient. She said they have been averaging 170's/80's. She reports good compliance and tolerance of her medications. She reports no recent issues with chest pain, shortness of breath, dizziness, or lightheadedness. She denies any difficulty breathing when lying flat or waking up gasping for breath. She has Lasix on hand for use as needed, but reports not having taken it for several weeks. She does not regularly weigh herself, instead monitoring her condition based on breathing and swelling.  The patient also has a history of mitral valve stenosis and is under the care of another physician for insomnia, which is reportedly well-managed. She recently saw a podiatrist for toenail issues, which are improving. She takes oxycodone for pain and occasionally uses Advil. She has been experiencing symptoms suggestive of a cold, including a runny nose and postnasal drainage, and has been using Mucinex and ipratropium nasal spray for symptom relief.  The patient has a spinal stimulator installed for pain management, which recently stopped working. She is in the process of contacting the company to resolve the issue. She also reports some back issues, which limit her physical activity to walking her sister's dog.  The patient's home blood pressure readings have been averaging 170s/80s, which is higher than the readings taken in the clinic. She has declined the COVID vaccine but is considering getting the shingles vaccine. She is due for a flu vaccine, which has  been postponed due to her current cold-like symptoms.   Hypertension, follow-up  BP Readings from Last 3 Encounters:  07/20/23 136/77  07/19/23 123/65  05/10/23 110/72   Wt Readings from Last 3 Encounters:  07/20/23 168 lb (76.2 kg)  07/19/23 168 lb (76.2 kg)  07/09/23 168 lb 9.6 oz (76.5 kg)     She was last seen for hypertension 6 months ago.  BP at that visit was 137/84. Management since that visit includes continue losartan 25 mg daily and Lasix 20 mg daily as needed.  She reports excellent compliance with treatment. She is not having side effects.  She is following a Regular diet. She is exercising - a little bit of walking (sister's dog). She does not smoke.  Use of agents associated with hypertension: decongestants and NSAIDS.   Outside blood pressures are higher - she reports they've been averaging 170s/80s but she is only checking when she feels panicky or similar.  Symptoms: No chest pain No chest pressure  No palpitations No syncope  No dyspnea No orthopnea  No paroxysmal nocturnal dyspnea No lower extremity edema   Pertinent labs Lab Results  Component Value Date   CHOL 184 07/19/2023   HDL 60 07/19/2023   LDLCALC 97 07/19/2023   TRIG 156 (H) 07/19/2023   CHOLHDL 3.1 07/19/2023   Lab Results  Component Value Date   NA 142 07/19/2023   K 4.0 07/19/2023   CREATININE 1.33 (H) 07/19/2023   GFRNONAA >60 09/11/2022   GLUCOSE 120 (H) 07/19/2023   TSH 3.673 03/10/2022     The 10-year ASCVD  risk score (Arnett DK, et al., 2019) is: 15.2%  ---------------------------------------------------------------------------------------------------     Medications: Outpatient Medications Prior to Visit  Medication Sig   albuterol (VENTOLIN HFA) 108 (90 Base) MCG/ACT inhaler INHALE TWO (2) PUFFS BY MOUTH AND INTO THE LUNGS EVERY 6 HOURS AS NEEDED FOR SHORTNESS OF BREATH   Atogepant 60 MG TABS Take by mouth.   atorvastatin (LIPITOR) 40 MG tablet Take 1 tablet (40  mg total) by mouth daily.   carvedilol (COREG) 12.5 MG tablet TAKE ONE TABLET BY MOUTH AT BREAKFAST AND AT BEDTIME   ezetimibe (ZETIA) 10 MG tablet Take 1 tablet (10 mg total) by mouth daily.   fexofenadine (ALLEGRA) 180 MG tablet Take 180 mg by mouth daily.   furosemide (LASIX) 20 MG tablet TAKE 1 TABLET BY MOUTH ONCE DAILY   gabapentin (NEURONTIN) 400 MG capsule Take 1 capsule (400 mg total) by mouth at bedtime.   hydrOXYzine (ATARAX) 10 MG tablet TAKE 1-2 TABLETS BY MOUTH TWICE DAILY AS NEEDED FOR SEVERE ANXIETY ATTACKS ONLY   ipratropium (ATROVENT) 0.03 % nasal spray Place 2 sprays into both nostrils every 12 (twelve) hours.   leflunomide (ARAVA) 20 MG tablet Take 20 mg by mouth at bedtime.   losartan (COZAAR) 25 MG tablet TAKE ONE TABLET BY MOUTH EVERYDAY AT BEDTIME   meloxicam (MOBIC) 15 MG tablet Take 15 mg by mouth at bedtime.   mirtazapine (REMERON) 15 MG tablet TAKE 1 TABLET BY MOUTH EVERY DAY AT BEDTIME   montelukast (SINGULAIR) 10 MG tablet TAKE ONE TABLET BY MOUTH EVERYDAY AT BEDTIME   Multiple Vitamin (MULTIVITAMIN WITH MINERALS) TABS tablet Take 1 tablet by mouth daily.   Nebulizer MISC Compressor nebulizer to use with nebulized medications as instructed   omeprazole (PRILOSEC) 40 MG capsule TAKE ONE CAPSULE BY MOUTH AT BREAKFAST AND AT BEDTIME   potassium chloride SA (KLOR-CON M) 20 MEQ tablet TAKE 1 TABLET BY MOUTH EVERY MORNING   topiramate (TOPAMAX) 25 MG tablet Take 1 tablet (25 mg total) by mouth 2 (two) times daily.   venlafaxine XR (EFFEXOR-XR) 75 MG 24 hr capsule TAKE (3) CAPSULES BY MOUTH EVERY MORNING   XIIDRA 5 % SOLN Place 1 drop into both eyes daily.   [DISCONTINUED] acyclovir ointment (ZOVIRAX) 5 % :Topical Every 3 Hours as needed for lesion   [DISCONTINUED] Budeson-Glycopyrrol-Formoterol (BREZTRI AEROSPHERE) 160-9-4.8 MCG/ACT AERO Inhale 2 puffs into the lungs in the morning and at bedtime.   [DISCONTINUED] LINZESS 145 MCG CAPS capsule Take 145 mcg by mouth daily  before breakfast.   [DISCONTINUED] oxyCODONE-acetaminophen (PERCOCET) 5-325 MG tablet Take 1 tablet by mouth every 12 (twelve) hours as needed for severe pain (pain score 7-10). Must last 30 days.   [DISCONTINUED] valACYclovir (VALTREX) 1000 MG tablet TAKE 2 TABLETS BY MOUTH TWICE DAILY AS NEEDED FOR 5 DAYS   albuterol (PROVENTIL) (2.5 MG/3ML) 0.083% nebulizer solution Take 3 mLs (2.5 mg total) by nebulization every 4 (four) hours as needed for wheezing or shortness of breath.   [DISCONTINUED] gentamicin cream (GARAMYCIN) 0.1 % Apply to affected toes once daily. Cover with fabric band-aid.   No facility-administered medications prior to visit.    Review of Systems  Constitutional:  Negative for appetite change, chills, fatigue and fever.  HENT:  Positive for rhinorrhea and sneezing. Negative for tinnitus.   Respiratory:  Negative for chest tightness and shortness of breath.   Cardiovascular:  Negative for chest pain and palpitations.  Gastrointestinal:  Negative for abdominal pain, nausea and vomiting.  Musculoskeletal:  Positive for back pain.  Neurological:  Negative for dizziness and weakness.        Objective    BP 123/65 (BP Location: Left Arm, Patient Position: Sitting, Cuff Size: Normal)   Pulse 66   Temp 98.8 F (37.1 C) (Oral)   Ht 5\' 9"  (1.753 m)   Wt 168 lb (76.2 kg)   SpO2 99%   BMI 24.81 kg/m     Physical Exam Constitutional:      Appearance: Normal appearance.  HENT:     Head: Normocephalic and atraumatic.  Eyes:     General: No scleral icterus.    Extraocular Movements: Extraocular movements intact.     Conjunctiva/sclera: Conjunctivae normal.  Cardiovascular:     Rate and Rhythm: Normal rate and regular rhythm.     Pulses: Normal pulses.     Heart sounds: Normal heart sounds.  Pulmonary:     Effort: Pulmonary effort is normal. No respiratory distress.     Breath sounds: Normal breath sounds.  Musculoskeletal:     Right lower leg: No edema.     Left  lower leg: No edema.  Skin:    General: Skin is warm and dry.  Neurological:     Mental Status: She is alert and oriented to person, place, and time. Mental status is at baseline.  Psychiatric:        Mood and Affect: Mood normal.        Behavior: Behavior normal.       Assessment & Plan    Follow-up exam, 3-6 months since previous exam  Essential hypertension Assessment & Plan: Blood pressure is well-controlled in the clinic (123/65) but home readings are higher (170s/80s). No symptoms of chest pain, dyspnea, dizziness, or edema. Non-adherence to Lasix and irregular self-monitoring. Discussed importance of regular monitoring and calibration of home blood pressure cuff. - Bring home blood pressure cuff to next visit for calibration - Check blood pressure daily for two weeks and send readings via MyChart - Continue current medications including Lasix as needed - uACR WNL 02/24/2023  Orders: -     Comprehensive metabolic panel -     Lipid panel  Iron deficiency anemia, unspecified iron deficiency anemia type Assessment & Plan: Low iron levels in 2023. Not taking iron supplements and diet low in iron-rich foods. Discussed importance of dietary iron and potential need for supplementation based on lab results. - Last iron panel done 06/08/2022 with an iron level of 23 and saturation ratio of 8%; other levels WNL - Order CBC and iron panel - Consider iron supplementation based on lab results  Orders: -     CBC with Differential/Platelet -     Iron, TIBC and Ferritin Panel -     B12 and Folate Panel  High risk medication use -     B12 and Folate Panel  Macrocytic anemia -     B12 and Folate Panel  Cold sore -     Acyclovir; :Topical Every 3 Hours as needed for lesion  Dispense: 30 g; Refill: 1 -     valACYclovir HCl; Take 2 tablets (2,000 mg total) by mouth daily.  Dispense: 10 tablet; Refill: 1  Nicotine dependence, cigarettes, in remission -     Ambulatory Referral for  Lung Cancer Scre  Chronic radicular lumbar pain Assessment & Plan: Chronic back pain managed with a spinal stimulator, currently non-functional due to charging issues. Discussed importance of resolving charging issue for effective pain management. 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 Using Mucinex and ipratropium nasal spray. No fever, chills, or anosmia. Discussed daily allergy medication for symptom management. - Consider daily allergy medication such as Allegra or Zyrtec (not both)   Primary insomnia Assessment & Plan: Managed by Dr. Elna Breslow. Reports sleep is going well. - Continue current management with Dr. Elna Breslow   Mitral valve stenosis, unspecified etiology Assessment & Plan: No current symptoms of chest pain, dyspnea, or edema. No acute concerns.  Continue to monitor.   General Health Maintenance Due for vaccinations and screenings. Discussed benefits of flu vaccine, shingles vaccine, and low-dose CT scan for lung cancer screening. Explained low-dose CT scan's reduced radiation exposure and continue annual recommendation for 15 years post-smoking cessation. - Administer flu vaccine in two weeks - Recommend shingles vaccine at pharmacy - Recommend low-dose CT scan for lung cancer screening due to smoking history   Return in about 2 weeks (around 08/02/2023) for Flu vaccine administration; then 11/10/23 or later for mAWV, then 11/24/2023 or later for CPE.      I discussed the assessment and treatment plan with the patient  The patient was provided an opportunity to ask questions and all were answered. The patient agreed with the plan and demonstrated an understanding of the instructions.   The patient was advised to call back or seek an in-person evaluation if the symptoms worsen or if the  condition fails to improve as anticipated.    Sherlyn Hay, DO  [facility] [phone] (phone) [phone] (fax)  [facility]

## 2023-07-19 NOTE — Assessment & Plan Note (Addendum)
Low iron levels in 2023. Not taking iron supplements and diet low in iron-rich foods. Discussed importance of dietary iron and potential need for supplementation based on lab results. - Last iron panel done 06/08/2022 with an iron level of 23 and saturation ratio of 8%; other levels WNL - Order CBC and iron panel - Consider iron supplementation based on lab results

## 2023-07-19 NOTE — Patient Instructions (Addendum)
Check your blood pressure once daily, and any time you have concerning symptoms like headache, chest pain, dizziness, shortness of breath, or vision changes.   Our goal is less than 130/80.  To appropriately check your blood pressure, make sure you do the following:  1) Avoid caffeine, exercise, or tobacco products for 30 minutes before checking. Empty your bladder. 2) Sit with your back supported in a flat-backed chair. Rest your arm on something flat (arm of the chair, table, etc). 3) Sit still with your feet flat on the floor, resting, for at least 5 minutes.  4) Check your blood pressure. Take 1-2 readings.  5) Write down these readings and bring with you to any provider appointments.  Bring your home blood pressure machine with you to a provider's office for accuracy comparison at least once a year.   Make sure you take your blood pressure medications before you come to any office visit, even if you were asked to fast for labs.   CALL the company about your implanted spinal stimulator to discuss getting it charged or otherwise see what needs to be done for it.   **I recommend you get the Shingles vaccine (Shingrix) at your pharmacy.**

## 2023-07-20 ENCOUNTER — Encounter: Payer: Self-pay | Admitting: Student in an Organized Health Care Education/Training Program

## 2023-07-20 ENCOUNTER — Ambulatory Visit
Payer: Medicare Other | Attending: Student in an Organized Health Care Education/Training Program | Admitting: Student in an Organized Health Care Education/Training Program

## 2023-07-20 VITALS — BP 136/77 | HR 73 | Temp 97.7°F | Ht 69.0 in | Wt 168.0 lb

## 2023-07-20 DIAGNOSIS — M5416 Radiculopathy, lumbar region: Secondary | ICD-10-CM | POA: Insufficient documentation

## 2023-07-20 DIAGNOSIS — M961 Postlaminectomy syndrome, not elsewhere classified: Secondary | ICD-10-CM | POA: Insufficient documentation

## 2023-07-20 DIAGNOSIS — M48061 Spinal stenosis, lumbar region without neurogenic claudication: Secondary | ICD-10-CM | POA: Insufficient documentation

## 2023-07-20 DIAGNOSIS — M5030 Other cervical disc degeneration, unspecified cervical region: Secondary | ICD-10-CM | POA: Insufficient documentation

## 2023-07-20 DIAGNOSIS — M48062 Spinal stenosis, lumbar region with neurogenic claudication: Secondary | ICD-10-CM | POA: Diagnosis not present

## 2023-07-20 DIAGNOSIS — G8929 Other chronic pain: Secondary | ICD-10-CM | POA: Insufficient documentation

## 2023-07-20 DIAGNOSIS — G894 Chronic pain syndrome: Secondary | ICD-10-CM | POA: Insufficient documentation

## 2023-07-20 LAB — COMPREHENSIVE METABOLIC PANEL
ALT: 19 [IU]/L (ref 0–32)
AST: 30 [IU]/L (ref 0–40)
Albumin: 4.1 g/dL (ref 3.9–4.9)
Alkaline Phosphatase: 133 [IU]/L — ABNORMAL HIGH (ref 44–121)
BUN/Creatinine Ratio: 11 — ABNORMAL LOW (ref 12–28)
BUN: 14 mg/dL (ref 8–27)
Bilirubin Total: 0.3 mg/dL (ref 0.0–1.2)
CO2: 21 mmol/L (ref 20–29)
Calcium: 8.9 mg/dL (ref 8.7–10.3)
Chloride: 108 mmol/L — ABNORMAL HIGH (ref 96–106)
Creatinine, Ser: 1.33 mg/dL — ABNORMAL HIGH (ref 0.57–1.00)
Globulin, Total: 2.1 g/dL (ref 1.5–4.5)
Glucose: 120 mg/dL — ABNORMAL HIGH (ref 70–99)
Potassium: 4 mmol/L (ref 3.5–5.2)
Sodium: 142 mmol/L (ref 134–144)
Total Protein: 6.2 g/dL (ref 6.0–8.5)
eGFR: 44 mL/min/{1.73_m2} — ABNORMAL LOW (ref >59)

## 2023-07-20 LAB — LIPID PANEL
Chol/HDL Ratio: 3.1 {ratio} (ref 0.0–4.4)
Cholesterol, Total: 184 mg/dL (ref 100–199)
HDL: 60 mg/dL (ref >39)
LDL Chol Calc (NIH): 97 mg/dL (ref 0–99)
Triglycerides: 156 mg/dL — ABNORMAL HIGH (ref 0–149)
VLDL Cholesterol Cal: 27 mg/dL (ref 5–40)

## 2023-07-20 LAB — CBC WITH DIFFERENTIAL/PLATELET
Basophils Absolute: 0.1 10*3/uL (ref 0.0–0.2)
Basos: 1 %
EOS (ABSOLUTE): 0.1 10*3/uL (ref 0.0–0.4)
Eos: 2 %
Hematocrit: 30.6 % — ABNORMAL LOW (ref 34.0–46.6)
Hemoglobin: 9.6 g/dL — ABNORMAL LOW (ref 11.1–15.9)
Immature Grans (Abs): 0 10*3/uL (ref 0.0–0.1)
Immature Granulocytes: 0 %
Lymphocytes Absolute: 1 10*3/uL (ref 0.7–3.1)
Lymphs: 21 %
MCH: 31.2 pg (ref 26.6–33.0)
MCHC: 31.4 g/dL — ABNORMAL LOW (ref 31.5–35.7)
MCV: 99 fL — ABNORMAL HIGH (ref 79–97)
Monocytes Absolute: 0.5 10*3/uL (ref 0.1–0.9)
Monocytes: 9 %
Neutrophils Absolute: 3.2 10*3/uL (ref 1.4–7.0)
Neutrophils: 67 %
Platelets: 271 10*3/uL (ref 150–450)
RBC: 3.08 x10E6/uL — ABNORMAL LOW (ref 3.77–5.28)
RDW: 12.8 % (ref 11.7–15.4)
WBC: 4.9 10*3/uL (ref 3.4–10.8)

## 2023-07-20 LAB — IRON,TIBC AND FERRITIN PANEL
Ferritin: 204 ng/mL — ABNORMAL HIGH (ref 15–150)
Iron Saturation: 39 % (ref 15–55)
Iron: 122 ug/dL (ref 27–139)
Total Iron Binding Capacity: 309 ug/dL (ref 250–450)
UIBC: 187 ug/dL (ref 118–369)

## 2023-07-20 LAB — B12 AND FOLATE PANEL
Folate: 7.8 ng/mL (ref >3.0)
Vitamin B-12: 565 pg/mL (ref 232–1245)

## 2023-07-20 MED ORDER — OXYCODONE-ACETAMINOPHEN 5-325 MG PO TABS: 1.0000 | ORAL_TABLET | Freq: Two times a day (BID) | ORAL | 0 refills | Status: AC | PRN

## 2023-07-20 MED ORDER — OXYCODONE-ACETAMINOPHEN 5-325 MG PO TABS: 1.0000 | ORAL_TABLET | Freq: Two times a day (BID) | ORAL | 0 refills | Status: DC | PRN

## 2023-07-20 NOTE — Progress Notes (Signed)
Safety precautions to be maintained throughout the outpatient stay will include: orient to surroundings, keep bed in low position, maintain call bell within reach at all times, provide assistance with transfer out of bed and ambulation.   Nursing Pain Medication Assessment:  Safety precautions to be maintained throughout the outpatient stay will include: orient to surroundings, keep bed in low position, maintain call bell within reach at all times, provide assistance with transfer out of bed and ambulation.  Medication Inspection Compliance: Pill count conducted under aseptic conditions, in front of the patient. Neither the pills nor the bottle was removed from the patient's sight at any time. Once count was completed pills were immediately returned to the patient in their original bottle.  Medication: Oxycodone/APAP Pill/Patch Count:  21 of 60 pills remain Pill/Patch Appearance: Markings consistent with prescribed medication Bottle Appearance: Standard pharmacy container. Clearly labeled. Filled Date: 69 / 15 / 2024 Last Medication intake:  Today

## 2023-07-20 NOTE — Progress Notes (Signed)
PROVIDER NOTE: Information contained herein reflects review and annotations entered in association with encounter. Interpretation of such information and data should be left to medically-trained personnel. Information provided to patient can be located elsewhere in the medical record under "Patient Instructions". Document created using STT-dictation technology, any transcriptional errors that may result from process are unintentional.    Patient: Gabrielle Pineda  Service Category: E/M  Provider: Edward Jolly, MD  DOB: 1956-05-25  DOS: 07/20/2023  Referring Provider: Despina Arias  MRN: [id number]  Specialty: Interventional Pain Management  PCP: Sherlyn Hay, DO  Type: Established Patient  Setting: Ambulatory outpatient    Location: Office  Delivery: Face-to-face     HPI  Gabrielle Pineda, a 67 y.o. year old female, is here today because of her Spinal stenosis, lumbar region, with neurogenic claudication [M48.062]. Gabrielle Pineda primary complain today is Back Pain (lower)  Pertinent problems: Gabrielle Pineda has Closed compression fracture of L5 vertebra (HCC); Cervical radiculopathy; Neuropathy; Spinal stenosis, lumbar region, with neurogenic claudication; Spondylosis of cervical region without myelopathy or radiculopathy; Cervical fusion syndrome; Chronic pain syndrome; Postlaminectomy syndrome, lumbar region; Chronic radicular lumbar pain; and Status post total hip replacement, right on their pertinent problem list. Pain Assessment: Severity of Chronic pain is reported as a 7 /10. Location: Back Lower/radiates to both hips. Onset: More than a month ago. Quality: Sharp. Timing: Constant. Modifying factor(s): meds. Vitals:  height is 5\' 9"  (1.753 m) and weight is 168 lb (76.2 kg). Her temporal temperature is 97.7 F (36.5 C). Her blood pressure is 136/77 and her pulse is 73. Her oxygen saturation is 99%.  BMI: Estimated body mass index is 24.81 kg/m as calculated from the following:    Height as of this encounter: 5\' 9"  (1.753 m).   Weight as of this encounter: 168 lb (76.2 kg). Last encounter: 04/22/2023. Last procedure: 12/14/2022.  Reason for encounter: medication management.  Discussed the use of AI scribe software for clinical note transcription with the patient, who gave verbal consent to proceed.  History of Present Illness   The patient, with a history of head injury, podiatric concerns, and migraines managed with Topamax, presents for a routine follow-up. She reports no significant changes in her health status since the last visit, with the head injury appearing to be healing well.  The patient has been experiencing issues with filling her prescriptions. She is currently on a regimen of gabapentin, which she believes has improved her memory and cognition since the dosage was reduced.  She also reports minimal use of Linzess for bowel management  The patient recently had a visit with a podiatrist, with no significant findings reported.       Pharmacotherapy Assessment  Analgesic: Percocet 5 mg TID PRN  Monitoring: [place] PMP: PDMP reviewed during this encounter.       Pharmacotherapy: No side-effects or adverse reactions reported. Compliance: No problems identified. Effectiveness: Clinically acceptable.  Florina Ou, RN  07/20/2023  8:29 AM  Sign when Signing Visit Safety precautions to be maintained throughout the outpatient stay will include: orient to surroundings, keep bed in low position, maintain call bell within reach at all times, provide assistance with transfer out of bed and ambulation.   Nursing Pain Medication Assessment:  Safety precautions to be maintained throughout the outpatient stay will include: orient to surroundings, keep bed in low position, maintain call bell within reach at all times, provide assistance with transfer out of bed and ambulation.  Medication Inspection Compliance:  Pill count conducted under aseptic conditions, in front  of the patient. Neither the pills nor the bottle was removed from the patient's sight at any time. Once count was completed pills were immediately returned to the patient in their original bottle.  Medication: Oxycodone/APAP Pill/Patch Count:  21 of 60 pills remain Pill/Patch Appearance: Markings consistent with prescribed medication Bottle Appearance: Standard pharmacy container. Clearly labeled. Filled Date: 29 / 15 / 2024 Last Medication intake:  Today  No results found for: "CBDTHCR" No results found for: "D8THCCBX" No results found for: "D9THCCBX"  UDS:  Summary  Date Value Ref Range Status  10/06/2022 Note  Final    Comment:    ==================================================================== [employer] Select 13 (MW) ==================================================================== Test                             Result       Flag       Units  Drug Present and Declared for Prescription Verification   Oxycodone                      1533         EXPECTED   ng/mg creat   Oxymorphone                    581          EXPECTED   ng/mg creat   Noroxycodone                   1658         EXPECTED   ng/mg creat   Noroxymorphone                 175          EXPECTED   ng/mg creat    Sources of oxycodone are scheduled prescription medications.    Oxymorphone, noroxycodone, and noroxymorphone are expected    metabolites of oxycodone. Oxymorphone is also available as a    scheduled prescription medication.  ==================================================================== Test                      Result    Flag   Units      Ref Range   Creatinine              36               mg/dL      >=16 ==================================================================== Declared Medications:  The flagging and interpretation on this report are based on the  following declared medications.  Unexpected results may arise from  inaccuracies in the declared medications.   **Note: The  testing scope of this panel includes these medications:   Oxycodone   **Note: The testing scope of this panel does not include the  following reported medications:   Acetaminophen  Acyclovir (Zovirax)  Albuterol (Ventolin HFA)  Aripiprazole (Abilify)  Atorvastatin (Lipitor)  Budesonide (Breztri Aerosphere)  Carvedilol (Coreg)  Eye Drops  Ezetimibe (Zetia)  Formoterol (Breztri Aerosphere)  Gabapentin (Neurontin)  Glycopyrrolate (Breztri Aerosphere)  Leflunomide (Arava)  Linaclotide (Linzess)  Meloxicam (Mobic)  Mirtazapine (Remeron)  Montelukast (Singulair)  Multivitamin  Omeprazole (Prilosec)  Potassium (Klor-Con)  Topiramate (Topamax)  Valacyclovir (Valtrex)  Venlafaxine (Effexor) ==================================================================== For clinical consultation, please call [phone]. ====================================================================       ROS  Constitutional: Denies any fever or chills Gastrointestinal: No reported hemesis, hematochezia, vomiting, or acute  GI distress Musculoskeletal: Denies any acute onset joint swelling, redness, loss of ROM, or weakness Neurological: No reported episodes of acute onset apraxia, aphasia, dysarthria, agnosia, amnesia, paralysis, loss of coordination, or loss of consciousness  Medication Review  Atogepant, Budeson-Glycopyrrol-Formoterol, Lifitegrast, Nebulizer, acyclovir ointment, albuterol, atorvastatin, carvedilol, ezetimibe, fexofenadine, furosemide, gabapentin, hydrOXYzine, ipratropium, leflunomide, losartan, meloxicam, mirtazapine, montelukast, multivitamin with minerals, omeprazole, oxyCODONE-acetaminophen, potassium chloride SA, topiramate, valACYclovir, and venlafaxine XR  History Review  Allergy: Gabrielle Pineda is allergic to sulfasalazine and plaquenil [hydroxychloroquine]. Drug: Gabrielle Pineda  reports no history of drug use. Alcohol:  reports no history of alcohol use. Tobacco:   reports that she quit smoking about 2 years ago. Her smoking use included cigarettes. She started smoking about 47 years ago. She has a 90 pack-year smoking history. She has never used smokeless tobacco. Social: Gabrielle Pineda  reports that she quit smoking about 2 years ago. Her smoking use included cigarettes. She started smoking about 47 years ago. She has a 90 pack-year smoking history. She has never used smokeless tobacco. She reports that she does not drink alcohol and does not use drugs. Medical:  has a past medical history of Alcohol abuse, Allergy (1983), Anemia, Anxiety, Arthritis, Cataract, Cervicalgia, Cirrhosis (HCC) (1994), COPD (chronic obstructive pulmonary disease) (HCC), Depression, Difficult intubation, Dyspnea, GERD (gastroesophageal reflux disease), Headache, Heart murmur, Hypertension, Neuromuscular disorder (HCC), Other and unspecified hyperlipidemia, Oxygen deficiency, Status post insertion of spinal cord stimulator, and Tachycardia. Surgical: Gabrielle Pineda  has a past surgical history that includes neck disc surgery; Breast enhancement surgery (1981); Esophagogastroduodenoscopy; Tonsillectomy and adenoidectomy (1973); Carpal tunnel release (11/11/2011); Carpal tunnel release (2013); Rotator cuff repair (06/16/2016); Rotator cuff repair; Colonoscopy with propofol (N/A, 11/01/2017); Augmentation mammaplasty (Bilateral, 1982); Anterior cervical decomp/discectomy fusion (2012, 2015, 2018); Lumbar laminectomy/decompression microdiscectomy (N/A, 08/15/2019); Spinal cord stimulator insertion (N/A, 03/17/2021); Total hip arthroplasty (Right, 04/15/2021); Joint replacement (2022); and Spine surgery (2020). Family: family history includes Alcohol abuse in her father, mother, sister, and sister; Anxiety disorder in her sister; Arthritis in her mother; Bipolar disorder in her mother; Cancer in her father; Depression in her sister; Drug abuse in her sister; Early death in her mother; Pancreatic cancer  in her father; Suicidality in her mother.  Laboratory Chemistry Profile   Renal Lab Results  Component Value Date   BUN 14 07/19/2023   CREATININE 1.33 (H) 07/19/2023   BCR 11 (L) 07/19/2023   GFRAA 72 05/06/2020   GFRNONAA >60 09/11/2022    Hepatic Lab Results  Component Value Date   AST 30 07/19/2023   ALT 19 07/19/2023   ALBUMIN 4.1 07/19/2023   ALKPHOS 133 (H) 07/19/2023   LIPASE 63 03/23/2018    Electrolytes Lab Results  Component Value Date   NA 142 07/19/2023   K 4.0 07/19/2023   CL 108 (H) 07/19/2023   CALCIUM 8.9 07/19/2023   PHOS 3.2 05/06/2020    Bone No results found for: "VD25OH", "VD125OH2TOT", "EX5284XL2", "GM0102VO5", "25OHVITD1", "25OHVITD2", "25OHVITD3", "TESTOFREE", "TESTOSTERONE"  Inflammation (CRP: Acute Phase) (ESR: Chronic Phase) No results found for: "CRP", "ESRSEDRATE", "LATICACIDVEN"       Note: Above Lab results reviewed.  Recent Imaging Review  ECHOCARDIOGRAM COMPLETE    ECHOCARDIOGRAM REPORT       Patient Name:   SHAQUELL ROEHR Date of Exam: 06/02/2023 Medical Rec #:  [id number]         Height:       69.0 in Accession #:    [id number]        Weight:  168.2 lb Date of Birth:  June 10, 1956        BSA:          1.919 m Patient Age:    66 years          BP:           110/72 mmHg Patient Gender: F                 HR:           72 bpm. Exam Location:  [facility]  Procedure: 2D Echo, Cardiac Doppler and Color Doppler  Indications:     MUrmur R01.1   History:         Patient has no prior history of Echocardiogram examinations.                  COPD, Signs/Symptoms:Murmur; Risk Factors:Hypertension.                  Anxiety.   Sonographer:     Cristela Blue Referring Phys:  [id number] CAR[address] Saliva Diagnosing Phys: Julien Nordmann MD    Sonographer Comments: Suboptimal apical window. Image acquisition challenging due to COPD. IMPRESSIONS   1. Left ventricular ejection fraction, by estimation, is 60 to 65%. The left ventricle has normal  function. The left ventricle has no regional wall motion abnormalities. There is mild left ventricular hypertrophy. Left ventricular diastolic parameters  are consistent with Grade I diastolic dysfunction (impaired relaxation).  2. Right ventricular systolic function is normal. The right ventricular size is normal.  3. The mitral valve is normal in structure. Mild mitral valve regurgitation. No evidence of mitral stenosis.  4. The aortic valve has an indeterminant number of cusps. There is mild calcification of the aortic valve. Aortic valve regurgitation is not visualized. Aortic valve sclerosis is present, with no evidence of aortic valve stenosis.  5. The inferior vena cava is normal in size with greater than 50% respiratory variability, suggesting right atrial pressure of 3 mmHg.  FINDINGS  Left Ventricle: Left ventricular ejection fraction, by estimation, is 60 to 65%. The left ventricle has normal function. The left ventricle has no regional wall motion abnormalities. The left ventricular internal cavity size was normal in size. There is  mild left ventricular hypertrophy. Left ventricular diastolic parameters are consistent with Grade I diastolic dysfunction (impaired relaxation).  Right Ventricle: The right ventricular size is normal. No increase in right ventricular wall thickness. Right ventricular systolic function is normal.  Left Atrium: Left atrial size was normal in size.  Right Atrium: Right atrial size was normal in size.  Pericardium: There is no evidence of pericardial effusion.  Mitral Valve: The mitral valve is normal in structure. Mild mitral valve regurgitation. No evidence of mitral valve stenosis. MV peak gradient, 5.0 mmHg. The mean mitral valve gradient is 2.0 mmHg.  Tricuspid Valve: The tricuspid valve is normal in structure. Tricuspid valve regurgitation is not demonstrated. No evidence of tricuspid stenosis.  Aortic Valve: The aortic valve has an indeterminant  number of cusps. There is mild calcification of the aortic valve. Aortic valve regurgitation is not visualized. Aortic valve sclerosis is present, with no evidence of aortic valve stenosis. Aortic valve  mean gradient measures 5.0 mmHg. Aortic valve peak gradient measures 9.2 mmHg. Aortic valve area, by VTI measures 2.18 cm.  Pulmonic Valve: The pulmonic valve was normal in structure. Pulmonic valve regurgitation is not visualized. No evidence of pulmonic stenosis.  Aorta: The aortic root is normal in size  and structure.  Venous: The inferior vena cava is normal in size with greater than 50% respiratory variability, suggesting right atrial pressure of 3 mmHg.  IAS/Shunts: No atrial level shunt detected by color flow Doppler.    LEFT VENTRICLE PLAX 2D LVIDd:         3.90 cm   Diastology LVIDs:         2.70 cm   LV e' medial:    5.98 cm/s LV PW:         1.50 cm   LV E/e' medial:  13.8 LV IVS:        1.30 cm   LV e' lateral:   11.70 cm/s LVOT diam:     2.00 cm   LV E/e' lateral: 7.0 LV SV:         67 LV SV Index:   35 LVOT Area:     3.14 cm    RIGHT VENTRICLE RV Basal diam:  2.80 cm RV Mid diam:    2.30 cm RV S prime:     12.70 cm/s TAPSE (M-mode): 1.7 cm  LEFT ATRIUM           Index        RIGHT ATRIUM           Index LA diam:      2.90 cm 1.51 cm/m   RA Area:     10.30 cm LA Vol (A4C): 33.0 ml 17.19 ml/m  RA Volume:   18.00 ml  9.38 ml/m  AORTIC VALVE AV Area (Vmax):    2.37 cm AV Area (Vmean):   1.98 cm AV Area (VTI):     2.18 cm AV Vmax:           151.33 cm/s AV Vmean:          100.200 cm/s AV VTI:            0.305 m AV Peak Grad:      9.2 mmHg AV Mean Grad:      5.0 mmHg LVOT Vmax:         114.00 cm/s LVOT Vmean:        63.000 cm/s LVOT VTI:          0.212 m LVOT/AV VTI ratio: 0.70   AORTA Ao Root diam: 2.90 cm  MITRAL VALVE                TRICUSPID VALVE MV Area (PHT): 3.76 cm     TR Peak grad:   14.6 mmHg MV Area VTI:   2.25 cm     TR Vmax:         191.00 cm/s MV Peak grad:  5.0 mmHg MV Mean grad:  2.0 mmHg     SHUNTS MV Vmax:       1.12 m/s     Systemic VTI:  0.21 m MV Vmean:      68.5 cm/s    Systemic Diam: 2.00 cm MV Decel Time: 202 msec MV E velocity: 82.30 cm/s MV A velocity: 110.00 cm/s MV E/A ratio:  0.75  Julien Nordmann MD Electronically signed by Julien Nordmann MD Signature Date/Time: 06/02/2023/1:08:03 PM      Final   Note: Reviewed        Physical Exam  General appearance: Well nourished, well developed, and well hydrated. In no apparent acute distress Mental status: Alert, oriented x 3 (person, place, & time)       Respiratory: No evidence of acute respiratory distress Eyes:  PERLA Vitals: BP 136/77   Pulse 73   Temp 97.7 F (36.5 C) (Temporal)   Ht 5\' 9"  (1.753 m)   Wt 168 lb (76.2 kg)   SpO2 99%   BMI 24.81 kg/m  BMI: Estimated body mass index is 24.81 kg/m as calculated from the following:   Height as of this encounter: 5\' 9"  (1.753 m).   Weight as of this encounter: 168 lb (76.2 kg). Ideal: Ideal body weight: 66.2 kg (145 lb 15.1 oz) Adjusted ideal body weight: 70.2 kg (154 lb 12.3 oz)  + LBP  Assessment   Diagnosis Status  1. Spinal stenosis, lumbar region, with neurogenic claudication   2. Chronic pain syndrome   3. Chronic radicular lumbar pain   4. Lumbar foraminal stenosis   5. Postlaminectomy syndrome, lumbar region   6. Lumbar radiculopathy (R>L)   7. DDD (degenerative disc disease), cervical   8. Failed back surgical syndrome    Controlled Controlled Controlled   Updated Problems: No problems updated.  Plan of Care  Problem-specific:  Assessment and Plan    Medication Management   She has encountered issues with filling prescriptions, particularly oxycodone, which is now filled at [facility]. A reduced dosage of gabapentin has improved her memory and cognition. Linzess was discontinued due to minimal use, while Singulair and Topamax for migraines will continue. We will refill  medications at [facility] on December 15th, discontinue Linzess, and continue both Singulair and Topamax for migraines.       Gabrielle Pineda has a current medication list which includes the following long-term medication(s): albuterol, albuterol, atorvastatin, carvedilol, ezetimibe, fexofenadine, furosemide, gabapentin, ipratropium, losartan, mirtazapine, montelukast, omeprazole, potassium chloride sa, topiramate, and venlafaxine xr.  Pharmacotherapy (Medications Ordered): Meds ordered this encounter  Medications   oxyCODONE-acetaminophen (PERCOCET) 5-325 MG tablet    Sig: Take 1 tablet by mouth every 12 (twelve) hours as needed for severe pain (pain score 7-10). Must last 30 days.    Dispense:  60 tablet    Refill:  0    Chronic Pain: STOP Act (Not applicable) Fill 1 day early if closed on refill date. Avoid benzodiazepines within 8 hours of opioids   oxyCODONE-acetaminophen (PERCOCET) 5-325 MG tablet    Sig: Take 1 tablet by mouth every 12 (twelve) hours as needed for severe pain (pain score 7-10). Must last 30 days.    Dispense:  60 tablet    Refill:  0    Chronic Pain: STOP Act (Not applicable) Fill 1 day early if closed on refill date. Avoid benzodiazepines within 8 hours of opioids   oxyCODONE-acetaminophen (PERCOCET) 5-325 MG tablet    Sig: Take 1 tablet by mouth every 12 (twelve) hours as needed for severe pain (pain score 7-10). Must last 30 days.    Dispense:  60 tablet    Refill:  0    Chronic Pain: STOP Act (Not applicable) Fill 1 day early if closed on refill date. Avoid benzodiazepines within 8 hours of opioids   Orders:  No orders of the defined types were placed in this encounter.  Follow-up plan:   Return in about 3 months (around 10/28/2023) for MM, F2F.   re is no content from the last Plan section.}    Recent Visits Date Type Provider Dept  04/22/23 Office Visit Edward Jolly, MD [facility]  Showing recent visits within past 90 days and meeting  all other requirements Today's Visits Date Type Provider Dept  07/20/23 Office Visit Edward Jolly, MD [facility]  Showing today's visits and meeting all other requirements Future Appointments No visits were found meeting these conditions. Showing future appointments within next 90 days and meeting all other requirements  I discussed the assessment and treatment plan with the patient. The patient was provided an opportunity to ask questions and all were answered. The patient agreed with the plan and demonstrated an understanding of the instructions.  Patient advised to call back or seek an in-person evaluation if the symptoms or condition worsens.  Duration of encounter: .  Total time on encounter, as per AMA guidelines included both the face-to-face and non-face-to-face time personally spent by the physician and/or other qualified health care professional(s) on the day of the encounter (includes time in activities that require the physician or other qualified health care professional and does not include time in activities normally performed by clinical staff). Physician's time may include the following activities when performed: Preparing to see the patient (e.g., pre-charting review of records, searching for previously ordered imaging, lab work, and nerve conduction tests) Review of prior analgesic pharmacotherapies. Reviewing PMP Interpreting ordered tests (e.g., lab work, imaging, nerve conduction tests) Performing post-procedure evaluations, including interpretation of diagnostic procedures Obtaining and/or reviewing separately obtained history Performing a medically appropriate examination and/or evaluation Counseling and educating the patient/family/caregiver Ordering medications, tests, or procedures Referring and communicating with other health care professionals (when not separately reported) Documenting clinical information in the electronic or other health  record Independently interpreting results (not separately reported) and communicating results to the patient/ family/caregiver Care coordination (not separately reported)  Note by: Edward Jolly, MD Date: 07/20/2023; Time: 9:40 AM

## 2023-07-26 ENCOUNTER — Telehealth: Payer: Self-pay | Admitting: Family Medicine

## 2023-07-26 ENCOUNTER — Telehealth: Payer: Self-pay | Admitting: Pharmacist

## 2023-07-26 ENCOUNTER — Other Ambulatory Visit: Payer: Self-pay | Admitting: Pharmacist

## 2023-07-26 MED ORDER — BREZTRI AEROSPHERE 160-9-4.8 MCG/ACT IN AERO: 2.0000 | INHALATION_SPRAY | Freq: Two times a day (BID) | RESPIRATORY_TRACT | 3 refills | Status: DC

## 2023-07-26 NOTE — Patient Instructions (Signed)
Goals Addressed             This Visit's Progress    Pharmacy Goals       If you need to reach out to patient assistance programs regarding refills or to find out the status of your application, you can do so by calling:  For Linzess, [employer] at [phone] For [place], [employer] at [phone]  Check your blood pressure once-twice weekly, and any time you have concerning symptoms like headache, chest pain, dizziness, shortness of breath, or vision changes.   Our goal is less than 130/80.  To appropriately check your blood pressure, make sure you do the following:  1) Avoid caffeine, exercise, or tobacco products for 30 minutes before checking. Empty your bladder. 2) Sit with your back supported in a flat-backed chair. Rest your arm on something flat (arm of the chair, table, etc). 3) Sit still with your feet flat on the floor, resting, for at least 5 minutes.  4) Check your blood pressure. Take 1-2 readings.  5) Write down these readings and bring with you to any provider appointments.  Bring your home blood pressure machine with you to a provider's office for accuracy comparison at least once a year.   Make sure you take your blood pressure medications before you come to any office visit, even if you were asked to fast for labs.  Estelle Grumbles, PharmD, [facility] [phone]

## 2023-07-26 NOTE — Telephone Encounter (Signed)
Copied from CRM [id number]. Topic: General - Inquiry >> Jul 23, 2023  1:11 PM Gabrielle Pineda wrote: Reason for CRM: patient called stated she will need to change her appt for 12/09 as she has another appt that may run over into her next appt. Please f/u with reschedule

## 2023-07-26 NOTE — Telephone Encounter (Signed)
Patient approved for re-enrollment in [facility] patient assistance program [employer] for 2025 calendar year.  Would you please e-prescribe new prescription for Breztri to [facility] (dispensing pharmacy for [employer]) for patient? Note would you please send for quantity of 3 month supply with refills as this is the quantity preferred by program to send per shipment?  Thank you!  Estelle Grumbles, PharmD, [facility] [phone]

## 2023-07-26 NOTE — Telephone Encounter (Signed)
Prescription has been send in

## 2023-07-26 NOTE — Progress Notes (Signed)
07/26/2023 Name: Gabrielle Pineda MRN: [id number] DOB: October 20, 1955  Chief Complaint  Patient presents with   Medication Assistance    Gabrielle Pineda is a 67 y.o. year old female who presented for a telephone visit.   They were referred to the pharmacist by their PCP for assistance in managing hypertension and medication access.    Conversation limited today as patient not home at the time of our call.   Subjective:   Care Team: Primary Care Provider: Sherlyn Hay, DO; Next Scheduled Visit: 08/03/2023 Rheumatology: Tracey Harries, MD Hematology: Rickard Patience, MD Pain Specialist: Edward Jolly, MD; Next Scheduled Visit: 10/26/2023 Pulmonology: Salena Saner, MD; Next Scheduled Visit: 09/09/2023 Psychiatry: Jomarie Longs, MD; Next Scheduled Visit: 09/28/2023 Neurologist: Gelene Mink, PA; Next Scheduled Visit: 09/03/2023 GI Specialist: Rowan Blase, NP; Upcoming colonoscopy scheduled for 08/09/2023  Medication Access/Adherence  Current Pharmacy:  [facility], [place] - [address] [place] [address] Phone: [phone] Fax: [phone]  [facility] - [place], [place] - [address] [place] [address] Phone: [phone] Fax: [phone]   Patient reports affordability concerns with their medications: Yes  Patient reports access/transportation concerns to their pharmacy: No  Patient reports adherence concerns with their medications:  No     Reports has switched to [facility] since [facility] closed (update preferred pharmacy list accordingly)   From review of chart, per note from CPhT from 04/12/2023, patient approved for patient assistance for Linzess from [employer] 04/09/2023-08/16/2024  - Patient confirms successfully receiving Linzess from assistance program   COPD/Allergic Rhinitis:   Current medications: - Breztri inhaler - 2 puffs in  morning and 2 puffs at bedtime - albuterol nebulizer solution - 1 vial via nebulizer every 4 hours as needed for wheezing/shortness of breath - albuterol inhaler - 2 puffs every 6 hours as needed for wheezing/shortness of breath - Fexofenadine 180 mg daily - montelukast 10 mg QHS   Confirms rinsing out mouth after each use of Breztri inhaler   Current medication access support: Enrolled in patient assistance for Breztri inhaler from [employer] through 08/17/2023 - Today reports that she does not believe that she has yet completed the re-enrollment for this program  Reports has sufficient supply of Breztri to last through end of calendar year   Objective:  Lab Results  Component Value Date   CREATININE 1.33 (H) 07/19/2023   BUN 14 07/19/2023   NA 142 07/19/2023   K 4.0 07/19/2023   CL 108 (H) 07/19/2023   CO2 21 07/19/2023    Current Outpatient Medications on File Prior to Visit  Medication Sig Dispense Refill   acyclovir ointment (ZOVIRAX) 5 % :Topical Every 3 Hours as needed for lesion 30 g 1   albuterol (PROVENTIL) (2.5 MG/3ML) 0.083% nebulizer solution Take 3 mLs (2.5 mg total) by nebulization every 4 (four) hours as needed for wheezing or shortness of breath. 225 mL 2   albuterol (VENTOLIN HFA) 108 (90 Base) MCG/ACT inhaler INHALE TWO (2) PUFFS BY MOUTH AND INTO THE LUNGS EVERY 6 HOURS AS NEEDED FOR SHORTNESS OF BREATH 8.5 g 10   Atogepant 60 MG TABS Take by mouth.     atorvastatin (LIPITOR) 40 MG tablet Take 1 tablet (40 mg total) by mouth daily. 90 tablet 3   Budeson-Glycopyrrol-Formoterol (BREZTRI AEROSPHERE) 160-9-4.8 MCG/ACT AERO Inhale 2 puffs into the lungs in the morning and at bedtime.  10.7 g 11   carvedilol (COREG) 12.5 MG tablet TAKE ONE TABLET BY MOUTH AT BREAKFAST AND AT BEDTIME 180 tablet 3   ezetimibe (ZETIA) 10 MG tablet Take 1 tablet (10 mg total) by mouth daily. 90 tablet 3   fexofenadine (ALLEGRA) 180 MG tablet Take 180 mg by mouth daily.     furosemide (LASIX)  20 MG tablet TAKE 1 TABLET BY MOUTH ONCE DAILY 30 tablet 2   gabapentin (NEURONTIN) 400 MG capsule Take 1 capsule (400 mg total) by mouth at bedtime. 30 capsule 5   hydrOXYzine (ATARAX) 10 MG tablet TAKE 1-2 TABLETS BY MOUTH TWICE DAILY AS NEEDED FOR SEVERE ANXIETY ATTACKS ONLY 120 tablet 10   ipratropium (ATROVENT) 0.03 % nasal spray Place 2 sprays into both nostrils every 12 (twelve) hours. 30 mL 12   leflunomide (ARAVA) 20 MG tablet Take 20 mg by mouth at bedtime.     losartan (COZAAR) 25 MG tablet TAKE ONE TABLET BY MOUTH EVERYDAY AT BEDTIME 90 tablet 1   meloxicam (MOBIC) 15 MG tablet Take 15 mg by mouth at bedtime.     mirtazapine (REMERON) 15 MG tablet TAKE 1 TABLET BY MOUTH EVERY DAY AT BEDTIME 30 tablet 1   montelukast (SINGULAIR) 10 MG tablet TAKE ONE TABLET BY MOUTH EVERYDAY AT BEDTIME 30 tablet 11   Multiple Vitamin (MULTIVITAMIN WITH MINERALS) TABS tablet Take 1 tablet by mouth daily.     Nebulizer MISC Compressor nebulizer to use with nebulized medications as instructed 1 each 0   omeprazole (PRILOSEC) 40 MG capsule TAKE ONE CAPSULE BY MOUTH AT BREAKFAST AND AT BEDTIME 180 capsule 1   [START ON 08/01/2023] oxyCODONE-acetaminophen (PERCOCET) 5-325 MG tablet Take 1 tablet by mouth every 12 (twelve) hours as needed for severe pain (pain score 7-10). Must last 30 days. 60 tablet 0   [START ON 08/31/2023] oxyCODONE-acetaminophen (PERCOCET) 5-325 MG tablet Take 1 tablet by mouth every 12 (twelve) hours as needed for severe pain (pain score 7-10). Must last 30 days. 60 tablet 0   [START ON 09/30/2023] oxyCODONE-acetaminophen (PERCOCET) 5-325 MG tablet Take 1 tablet by mouth every 12 (twelve) hours as needed for severe pain (pain score 7-10). Must last 30 days. 60 tablet 0   potassium chloride SA (KLOR-CON M) 20 MEQ tablet TAKE 1 TABLET BY MOUTH EVERY MORNING 90 tablet 1   topiramate (TOPAMAX) 25 MG tablet Take 1 tablet (25 mg total) by mouth 2 (two) times daily. 60 tablet 11   valACYclovir  (VALTREX) 1000 MG tablet Take 2 tablets (2,000 mg total) by mouth daily. 10 tablet 1   venlafaxine XR (EFFEXOR-XR) 75 MG 24 hr capsule TAKE (3) CAPSULES BY MOUTH EVERY MORNING 90 capsule 10   XIIDRA 5 % SOLN Place 1 drop into both eyes daily.     No current facility-administered medications on file prior to visit.        Assessment/Plan:   Patient to follow up with [employer] assistance program as needed for refills of Breztri inhaler and with [employer] as needed for refills of Linzess  COPD: - Reviewed appropriate inhaler technique. - Contact [employer] patient assistance program on behalf of patient and representative sends re-enrollment link to patient.  Call back to patient who confirms received link from [employer] and will now completes re-enrollment via this Digital Assistant during our call Send message to Pulmonologist to request provider send renewal of Breztri prescription to [facility] (dispensing pharmacy for [employer])   Follow Up Plan: Clinical Pharmacist will follow up with  patient by telephone on  09/22/2023 at 9:00 AM    Estelle Grumbles, PharmD, [facility] [phone]

## 2023-07-29 DIAGNOSIS — F17211 Nicotine dependence, cigarettes, in remission: Secondary | ICD-10-CM | POA: Insufficient documentation

## 2023-07-29 DIAGNOSIS — I050 Rheumatic mitral stenosis: Secondary | ICD-10-CM | POA: Insufficient documentation

## 2023-07-29 DIAGNOSIS — J069 Acute upper respiratory infection, unspecified: Secondary | ICD-10-CM | POA: Insufficient documentation

## 2023-07-29 NOTE — Assessment & Plan Note (Signed)
Runny nose, sneezing, and congestion. Using Mucinex and ipratropium nasal spray. No fever, chills, or anosmia. Discussed daily allergy medication for symptom management. - Consider daily allergy medication such as Allegra or Zyrtec (not both)

## 2023-07-29 NOTE — Assessment & Plan Note (Signed)
Chronic back pain managed with a spinal stimulator, currently non-functional due to charging issues. Discussed importance of resolving charging issue for effective pain management. - Contact company to resolve charging issues with spinal stimulator - Consider physical therapy for back pain management

## 2023-07-29 NOTE — Assessment & Plan Note (Signed)
No current symptoms of chest pain, dyspnea, or edema. No acute concerns.  Continue to monitor.

## 2023-07-29 NOTE — Assessment & Plan Note (Signed)
Managed by Dr. Elna Breslow. Reports sleep is going well. - Continue current management with Dr. Elna Breslow

## 2023-08-03 ENCOUNTER — Ambulatory Visit: Payer: Medicare Other | Admitting: Family Medicine

## 2023-08-03 ENCOUNTER — Other Ambulatory Visit: Payer: Self-pay | Admitting: Psychiatry

## 2023-08-03 DIAGNOSIS — F5101 Primary insomnia: Secondary | ICD-10-CM

## 2023-08-03 DIAGNOSIS — F3342 Major depressive disorder, recurrent, in full remission: Secondary | ICD-10-CM

## 2023-08-03 DIAGNOSIS — F411 Generalized anxiety disorder: Secondary | ICD-10-CM

## 2023-08-04 ENCOUNTER — Ambulatory Visit: Payer: Medicare Other

## 2023-08-08 NOTE — H&P (Signed)
Pre-Procedure H&P   Patient ID: Gabrielle Pineda is a 67 y.o. female.  Gastroenterology Provider: Jaynie Collins, DO  Referring Provider: Fransico Setters, NP PCP: Sherlyn Hay, DO  Date: 08/09/2023  HPI [name] is a 67 y.o. female who presents today for Esophagogastroduodenoscopy and Colonoscopy for GERD, personal history of colon polyps, anemia .  Patient with a longstanding history of normocytic normochromic anemia GERD and a personal history of colon polyps.  She also has a family history of colon cancer in her paternal cousin (age 36s) and maternal uncle (71s).  She was tested for Lynch syndrome in 2018 which was negative per the patient.  Last underwent colonoscopy in August 2020 demonstrating 1 sessile serrated and 1 tubular adenomatous polyp.  She was recommended 1 year repeat, but this is yet to be performed. Previous colonoscopies demonstrated polyps and internal hemorrhoids.  Longstanding tobacco history.  Status post right hip repair Ferritin 204 iron saturation 39 creatinine 1.3 hemoglobin 9.6 MCV 99 platelets 271,000 B12 565 folate 7.8   Past Medical History:  Diagnosis Date   Alcohol abuse    Allergy 1983   Anemia    Anxiety    Arthritis    Cataract    Cervicalgia    Cirrhosis (HCC) 1994   COPD (chronic obstructive pulmonary disease) (HCC)    Depression    Difficult intubation    has plates and screws in neck   Dyspnea    GERD (gastroesophageal reflux disease)    Headache    Heart murmur    on heard when pt is lying   Hypertension    Neuromuscular disorder (HCC)    Neuropathy   Other and unspecified hyperlipidemia    Oxygen deficiency    Status post insertion of spinal cord stimulator    Tachycardia    d/t questionable anxiety happens every 5- 10 years, sees [facility]    Past Surgical History:  Procedure Laterality Date   ANTERIOR CERVICAL DECOMP/DISCECTOMY FUSION  2012, 2015, 2018   x3   AUGMENTATION MAMMAPLASTY  Bilateral 1982   BREAST ENHANCEMENT SURGERY  1981   CARPAL TUNNEL RELEASE  11/11/2011   Procedure: CARPAL TUNNEL RELEASE;  Surgeon: [name], MD;  Location: [facility];  Service: Neurosurgery;  Laterality: Right;  Right Median Nerve Decompression   CARPAL TUNNEL RELEASE  2013   COLONOSCOPY WITH PROPOFOL N/A 11/01/2017   Procedure: COLONOSCOPY WITH PROPOFOL;  Surgeon: [name], MD;  Location: [facility] ENDOSCOPY;  Service: Endoscopy;  Laterality: N/A;   ESOPHAGOGASTRODUODENOSCOPY     JOINT REPLACEMENT  2022   LUMBAR LAMINECTOMY/DECOMPRESSION MICRODISCECTOMY N/A 08/15/2019   Procedure: Lumbar three to Sacral one Decompressive lumbar laminectomy;  Surgeon: [name], MD;  Location: [facility] OR;  Service: Neurosurgery;  Laterality: N/A;   neck disc surgery     plates and screws in neck x 2   ROTATOR CUFF REPAIR  06/16/2016   right shoulder    ROTATOR CUFF REPAIR     SPINAL CORD STIMULATOR INSERTION N/A 03/17/2021   Procedure: THORACIC SPINAL CORD STIMULATOR (PERCUTANEOUS) & PULSE GENERATOR PLACEMENT;  Surgeon: [name], MD;  Location: [facility] ORS;  Service: Neurosurgery;  Laterality: N/A;   SPINE SURGERY  2020   TONSILLECTOMY AND ADENOIDECTOMY  1973   TOTAL HIP ARTHROPLASTY Right 04/15/2021   Procedure: TOTAL HIP ARTHROPLASTY ANTERIOR APPROACH;  Surgeon: [name], MD;  Location: [facility] ORS;  Service: Orthopedics;  Laterality: Right;    Family History Colorectal cancer-paternal  cousin 28s; maternal uncle 53s No other h/o GI disease or malignancy  Review of Systems  Constitutional:  Negative for activity change, appetite change, chills, diaphoresis, fatigue, fever and unexpected weight change.  HENT:  Negative for trouble swallowing and voice change.   Respiratory:  Negative for shortness of breath and wheezing.   Cardiovascular:  Negative for chest pain, palpitations and leg swelling.  Gastrointestinal:  Negative for abdominal distention, abdominal pain, anal bleeding,  blood in stool, constipation, diarrhea, nausea, rectal pain and vomiting.  Musculoskeletal:  Negative for arthralgias and myalgias.  Skin:  Negative for color change and pallor.  Neurological:  Negative for dizziness, syncope and weakness.  Psychiatric/Behavioral:  Negative for confusion.   All other systems reviewed and are negative.    Medications No current facility-administered medications on file prior to encounter.   Current Outpatient Medications on File Prior to Encounter  Medication Sig Dispense Refill   atorvastatin (LIPITOR) 40 MG tablet Take 1 tablet (40 mg total) by mouth daily. 90 tablet 3   carvedilol (COREG) 12.5 MG tablet TAKE ONE TABLET BY MOUTH AT BREAKFAST AND AT BEDTIME 180 tablet 3   ezetimibe (ZETIA) 10 MG tablet Take 1 tablet (10 mg total) by mouth daily. 90 tablet 3   fexofenadine (ALLEGRA) 180 MG tablet Take 180 mg by mouth daily.     gabapentin (NEURONTIN) 400 MG capsule Take 1 capsule (400 mg total) by mouth at bedtime. 30 capsule 5   omeprazole (PRILOSEC) 40 MG capsule TAKE ONE CAPSULE BY MOUTH AT BREAKFAST AND AT BEDTIME 180 capsule 1   albuterol (PROVENTIL) (2.5 MG/3ML) 0.083% nebulizer solution Take 3 mLs (2.5 mg total) by nebulization every 4 (four) hours as needed for wheezing or shortness of breath. 225 mL 2   ipratropium (ATROVENT) 0.03 % nasal spray Place 2 sprays into both nostrils every 12 (twelve) hours. 30 mL 12   leflunomide (ARAVA) 20 MG tablet Take 20 mg by mouth at bedtime.     losartan (COZAAR) 25 MG tablet TAKE ONE TABLET BY MOUTH EVERYDAY AT BEDTIME 90 tablet 1   meloxicam (MOBIC) 15 MG tablet Take 15 mg by mouth at bedtime.     montelukast (SINGULAIR) 10 MG tablet TAKE ONE TABLET BY MOUTH EVERYDAY AT BEDTIME 30 tablet 11   Multiple Vitamin (MULTIVITAMIN WITH MINERALS) TABS tablet Take 1 tablet by mouth daily.     Nebulizer MISC Compressor nebulizer to use with nebulized medications as instructed 1 each 0   XIIDRA 5 % SOLN Place 1 drop into  both eyes daily.      Pertinent medications related to GI and procedure were reviewed by me with the patient prior to the procedure   Current Facility-Administered Medications:    0.9 %  sodium chloride infusion, , Intravenous, Continuous, Jaynie Collins, DO, Last Rate: 20 mL/hr at 08/09/23 0855, New Bag at 08/09/23 0855  sodium chloride 20 mL/hr at 08/09/23 6301       Allergies  Allergen Reactions   Sulfasalazine Other (See Comments)   Plaquenil [Hydroxychloroquine] Rash   Allergies were reviewed by me prior to the procedure  Objective   Body mass index is 24.81 kg/m. Vitals:   08/09/23 0832 08/09/23 0847  BP:  120/70  Pulse:  76  Resp:  18  Temp:  (!) 96.7 F (35.9 C)  TempSrc:  Temporal  SpO2:  97%  Weight: 76.2 kg   Height: 5\' 9"  (1.753 m)      Physical Exam Vitals and nursing  note reviewed.  Constitutional:      General: She is not in acute distress.    Appearance: Normal appearance. She is not ill-appearing, toxic-appearing or diaphoretic.  HENT:     Head: Normocephalic and atraumatic.     Nose: Nose normal.     Mouth/Throat:     Mouth: Mucous membranes are moist.     Pharynx: Oropharynx is clear.  Eyes:     General: No scleral icterus.    Extraocular Movements: Extraocular movements intact.  Cardiovascular:     Rate and Rhythm: Normal rate and regular rhythm.     Heart sounds: Normal heart sounds. No murmur heard.    No friction rub. No gallop.  Pulmonary:     Effort: Pulmonary effort is normal. No respiratory distress.     Breath sounds: Normal breath sounds. No wheezing, rhonchi or rales.  Abdominal:     General: Bowel sounds are normal. There is no distension.     Palpations: Abdomen is soft.     Tenderness: There is no abdominal tenderness. There is no guarding or rebound.  Musculoskeletal:     Cervical back: Neck supple.     Right lower leg: No edema.     Left lower leg: No edema.  Skin:    General: Skin is warm and dry.      Coloration: Skin is not jaundiced or pale.  Neurological:     General: No focal deficit present.     Mental Status: She is alert and oriented to person, place, and time. Mental status is at baseline.  Psychiatric:        Mood and Affect: Mood normal.        Behavior: Behavior normal.        Thought Content: Thought content normal.        Judgment: Judgment normal.      Assessment:  [name] is a 67 y.o. female  who presents today for Esophagogastroduodenoscopy and Colonoscopy for GERD, personal history of colon polyps, anemia .  Plan:  Esophagogastroduodenoscopy and Colonoscopy with possible intervention today  Esophagogastroduodenoscopy and Colonoscopy with possible biopsy, control of bleeding, polypectomy, and interventions as necessary has been discussed with the patient/patient representative. Informed consent was obtained from the patient/patient representative after explaining the indication, nature, and risks of the procedure including but not limited to death, bleeding, perforation, missed neoplasm/lesions, cardiorespiratory compromise, and reaction to medications. Opportunity for questions was given and appropriate answers were provided. Patient/patient representative has verbalized understanding is amenable to undergoing the procedure.   Jaynie Collins, DO  [facility]  Portions of the record may have been created with voice recognition software. Occasional wrong-word or 'sound-a-like' substitutions may have occurred due to the inherent limitations of voice recognition software.  Read the chart carefully and recognize, using context, where substitutions may have occurred.

## 2023-08-09 ENCOUNTER — Ambulatory Visit: Payer: Medicare Other | Admitting: Anesthesiology

## 2023-08-09 ENCOUNTER — Encounter: Payer: Self-pay | Admitting: Gastroenterology

## 2023-08-09 ENCOUNTER — Encounter
Admission: RE | Disposition: A | Discharge status: Home / Self Care | Payer: Self-pay | Source: Home / Self Care | Attending: Gastroenterology

## 2023-08-09 ENCOUNTER — Ambulatory Visit
Admission: RE | Admit: 2023-08-09 | Discharge: 2023-08-09 | Disposition: A | Discharge status: Home / Self Care | Payer: Medicare Other | Attending: Gastroenterology | Admitting: Gastroenterology

## 2023-08-09 DIAGNOSIS — I10 Essential (primary) hypertension: Secondary | ICD-10-CM | POA: Insufficient documentation

## 2023-08-09 DIAGNOSIS — K640 First degree hemorrhoids: Secondary | ICD-10-CM | POA: Diagnosis not present

## 2023-08-09 DIAGNOSIS — K5939 Other megacolon: Secondary | ICD-10-CM | POA: Insufficient documentation

## 2023-08-09 DIAGNOSIS — K5730 Diverticulosis of large intestine without perforation or abscess without bleeding: Secondary | ICD-10-CM | POA: Diagnosis not present

## 2023-08-09 DIAGNOSIS — Q438 Other specified congenital malformations of intestine: Secondary | ICD-10-CM | POA: Diagnosis not present

## 2023-08-09 DIAGNOSIS — J449 Chronic obstructive pulmonary disease, unspecified: Secondary | ICD-10-CM | POA: Diagnosis not present

## 2023-08-09 DIAGNOSIS — K635 Polyp of colon: Secondary | ICD-10-CM | POA: Diagnosis not present

## 2023-08-09 DIAGNOSIS — Z860101 Personal history of adenomatous and serrated colon polyps: Secondary | ICD-10-CM | POA: Diagnosis not present

## 2023-08-09 DIAGNOSIS — Z800 Family history of malignant neoplasm of digestive organs: Secondary | ICD-10-CM | POA: Insufficient documentation

## 2023-08-09 DIAGNOSIS — I471 Supraventricular tachycardia, unspecified: Secondary | ICD-10-CM | POA: Diagnosis not present

## 2023-08-09 DIAGNOSIS — K219 Gastro-esophageal reflux disease without esophagitis: Secondary | ICD-10-CM | POA: Diagnosis not present

## 2023-08-09 DIAGNOSIS — K649 Unspecified hemorrhoids: Secondary | ICD-10-CM | POA: Diagnosis not present

## 2023-08-09 DIAGNOSIS — K5790 Diverticulosis of intestine, part unspecified, without perforation or abscess without bleeding: Secondary | ICD-10-CM | POA: Diagnosis not present

## 2023-08-09 DIAGNOSIS — Z1211 Encounter for screening for malignant neoplasm of colon: Secondary | ICD-10-CM | POA: Diagnosis not present

## 2023-08-09 DIAGNOSIS — D509 Iron deficiency anemia, unspecified: Secondary | ICD-10-CM | POA: Diagnosis not present

## 2023-08-09 HISTORY — PX: COLONOSCOPY WITH PROPOFOL: SHX5780

## 2023-08-09 HISTORY — PX: POLYPECTOMY: SHX5525

## 2023-08-09 HISTORY — PX: ESOPHAGOGASTRODUODENOSCOPY (EGD) WITH PROPOFOL: SHX5813

## 2023-08-09 SURGERY — COLONOSCOPY WITH PROPOFOL
Anesthesia: General

## 2023-08-09 MED ORDER — PROPOFOL 500 MG/50ML IV EMUL
INTRAVENOUS | Status: DC | PRN
  Administered 2023-08-09: 150 ug/kg/min via INTRAVENOUS

## 2023-08-09 MED ORDER — PROPOFOL 10 MG/ML IV BOLUS
INTRAVENOUS | Status: DC | PRN
  Administered 2023-08-09: 80 mg via INTRAVENOUS
  Administered 2023-08-09: 50 mg via INTRAVENOUS

## 2023-08-09 MED ORDER — SODIUM CHLORIDE 0.9 % IV SOLN: INTRAVENOUS | Status: DC

## 2023-08-09 MED ORDER — LIDOCAINE HCL (PF) 2 % IJ SOLN
INTRAMUSCULAR | Status: DC | PRN
Start: 2023-08-09 — End: 2023-08-09
  Administered 2023-08-09: 80 mg via INTRADERMAL

## 2023-08-09 NOTE — Anesthesia Postprocedure Evaluation (Signed)
Anesthesia Post Note  Patient: Ezme Z Helgeson  Procedure(s) Performed: COLONOSCOPY WITH PROPOFOL ESOPHAGOGASTRODUODENOSCOPY (EGD) WITH PROPOFOL POLYPECTOMY  Patient location during evaluation: Endoscopy Anesthesia Type: General Level of consciousness: awake and alert Pain management: pain level controlled Vital Signs Assessment: post-procedure vital signs reviewed and stable Respiratory status: spontaneous breathing, nonlabored ventilation, respiratory function stable and patient connected to nasal cannula oxygen Cardiovascular status: blood pressure returned to baseline and stable Postop Assessment: no apparent nausea or vomiting Anesthetic complications: no   No notable events documented.   Last Vitals:  Vitals:   08/09/23 1020 08/09/23 1030  BP: 120/66   Pulse:    Resp: 16   Temp: (!) 35.8 C   SpO2: 98% 98%    Last Pain:  Vitals:   08/09/23 1030  TempSrc:   PainSc: 0-No pain                 Cleda Mccreedy Nona Gracey

## 2023-08-09 NOTE — Op Note (Signed)
[facility] Patient Name: Gabrielle Pineda Procedure Date: 08/09/2023 9:16 AM MRN: [id number] Account #: [id number] Date of Birth: May 26, 1956 Admit Type: Outpatient Age: 67 Room: [facility] ENDO ROOM 2 Gender: Female Note Status: Finalized Instrument Name: Upper Endoscope [id number] Procedure:             Upper GI endoscopy Indications:           Follow-up of esophageal reflux Providers:             Jaynie Collins DO, DO Referring MD:          Sherlyn Hay (Referring MD) Medicines:             Monitored Anesthesia Care Complications:         No immediate complications. Estimated blood loss: None. Procedure:             Pre-Anesthesia Assessment:                        - Prior to the procedure, a History and Physical was                         performed, and patient medications and allergies were                         reviewed. The patient is competent. The risks and                         benefits of the procedure and the sedation options and                         risks were discussed with the patient. All questions                         were answered and informed consent was obtained.                         Patient identification and proposed procedure were                         verified by the physician, the nurse, the anesthetist                         and the technician in the endoscopy suite. Mental                         Status Examination: alert and oriented. Airway                         Examination: normal oropharyngeal airway and neck                         mobility. Respiratory Examination: clear to                         auscultation. CV Examination: RRR, no murmurs, no S3                         or S4. Prophylactic Antibiotics: The patient does not  require prophylactic antibiotics. Prior                         Anticoagulants: The patient has taken no anticoagulant                         or  antiplatelet agents. ASA Grade Assessment: III - A                         patient with severe systemic disease. After reviewing                         the risks and benefits, the patient was deemed in                         satisfactory condition to undergo the procedure. The                         anesthesia plan was to use monitored anesthesia care                         (MAC). Immediately prior to administration of                         medications, the patient was re-assessed for adequacy                         to receive sedatives. The heart rate, respiratory                         rate, oxygen saturations, blood pressure, adequacy of                         pulmonary ventilation, and response to care were                         monitored throughout the procedure. The physical                         status of the patient was re-assessed after the                         procedure.                        After obtaining informed consent, the endoscope was                         passed under direct vision. Throughout the procedure,                         the patient's blood pressure, pulse, and oxygen                         saturations were monitored continuously. The Endoscope                         was introduced through the mouth, and advanced to the  second part of duodenum. The upper GI endoscopy was                         accomplished without difficulty. The patient tolerated                         the procedure well. Findings:      The duodenal bulb, first portion of the duodenum and second portion of       the duodenum were normal. Estimated blood loss: none.      The entire examined stomach was normal. Estimated blood loss: none.      The Z-line was regular.      Esophagogastric landmarks were identified: the gastroesophageal junction       was found at 35 cm from the incisors.      The exam of the esophagus was otherwise  normal. Impression:            - Normal duodenal bulb, first portion of the duodenum                         and second portion of the duodenum.                        - Normal stomach.                        - Z-line regular.                        - Esophagogastric landmarks identified.                        - No specimens collected. Recommendation:        - Patient has a contact number available for                         emergencies. The signs and symptoms of potential                         delayed complications were discussed with the patient.                         Return to normal activities tomorrow. Written                         discharge instructions were provided to the patient.                        - Discharge patient to home.                        - Resume previous diet.                        - Continue present medications.                        - Return to GI clinic as previously scheduled.                        - Consider change in ppi therapy  or pH or bravo study.                         Would need to be off ppi therapy for acid reflux                         studies                        - The findings and recommendations were discussed with                         the patient. Procedure Code(s):     --- Professional ---                        [phone], Esophagogastroduodenoscopy, flexible,                         transoral; diagnostic, including collection of                         specimen(s) by brushing or washing, when performed                         (separate procedure) Diagnosis Code(s):     --- Professional ---                        K21.9, Gastro-esophageal reflux disease without                         esophagitis CPT copyright 2022 American Medical Association. All rights reserved. The codes documented in this report are preliminary and upon coder review may  be revised to meet current compliance requirements. Attending Participation:      I  personally performed the entire procedure. Elfredia Nevins, DO Jaynie Collins DO, DO 08/09/2023 9:43:33 AM This report has been signed electronically. Number of Addenda: 0 Note Initiated On: 08/09/2023 9:16 AM Estimated Blood Loss:  Estimated blood loss: none.      [facility]

## 2023-08-09 NOTE — Transfer of Care (Signed)
Immediate Anesthesia Transfer of Care Note  Patient: Gabrielle Pineda  Procedure(s) Performed: COLONOSCOPY WITH PROPOFOL ESOPHAGOGASTRODUODENOSCOPY (EGD) WITH PROPOFOL POLYPECTOMY  Patient Location: PACU  Anesthesia Type:General  Level of Consciousness: awake  Airway & Oxygen Therapy: Patient Spontanous Breathing  Post-op Assessment: Report given to RN and Post -op Vital signs reviewed and stable  Post vital signs: Reviewed and stable  Last Vitals: all VSS and normalb see flow sheet  Vitals Value Taken Time  BP    Temp    Pulse    Resp    SpO2      Last Pain:  Vitals:   08/09/23 0847  TempSrc: Temporal  PainSc: 7          Complications: No notable events documented.

## 2023-08-09 NOTE — Interval H&P Note (Signed)
History and Physical Interval Note: Preprocedure H&P from 08/09/23  was reviewed and there was no interval change after seeing and examining the patient.  Written consent was obtained from the patient after discussion of risks, benefits, and alternatives. Patient has consented to proceed with Esophagogastroduodenoscopy and Colonoscopy with possible intervention   08/09/2023 9:30 AM  Gabrielle Pineda  has presented today for surgery, with the diagnosis of V12.72 (ICD-9-CM) - Z86.010 (ICD-10-CM) - Hx of adenomatous colonic polyps 280.9 (ICD-9-CM) - D50.9 (ICD-10-CM) - Iron deficiency anemia, unspecified iron deficiency anemia type 530.81 (ICD-9-CM) - K21.9 (ICD-10-CM) - Gastroesophageal reflux disease, unspecified whether esophagitis present.  The various methods of treatment have been discussed with the patient and family. After consideration of risks, benefits and other options for treatment, the patient has consented to  Procedure(s): COLONOSCOPY WITH PROPOFOL (N/A) ESOPHAGOGASTRODUODENOSCOPY (EGD) WITH PROPOFOL (N/A) as a surgical intervention.  The patient's history has been reviewed, patient examined, no change in status, stable for surgery.  I have reviewed the patient's chart and labs.  Questions were answered to the patient's satisfaction.     Jaynie Collins

## 2023-08-09 NOTE — Op Note (Signed)
[facility]astroenterology Patient Name: Gabrielle Pineda Procedure Date: 08/09/2023 9:15 AM MRN: [id number] Account #: [id number] Date of Birth: 12/23/1955 Admit Type: Outpatient Age: 67 Room: [facility] ENDO ROOM 2 Gender: Female Note Status: Finalized Instrument Name: Peds Colonoscope [id number] Procedure:             Colonoscopy Indications:           High risk colon cancer surveillance: Personal history                         of colonic polyps Providers:             Trenda Moots, DO Referring MD:          Sherlyn Hay (Referring MD) Medicines:             Monitored Anesthesia Care Complications:         No immediate complications. Estimated blood loss:                         Minimal. Procedure:             Pre-Anesthesia Assessment:                        - Prior to the procedure, a History and Physical was                         performed, and patient medications and allergies were                         reviewed. The patient is competent. The risks and                         benefits of the procedure and the sedation options and                         risks were discussed with the patient. All questions                         were answered and informed consent was obtained.                         Patient identification and proposed procedure were                         verified by the physician, the nurse, the anesthetist                         and the technician in the endoscopy suite. Mental                         Status Examination: alert and oriented. Airway                         Examination: normal oropharyngeal airway and neck                         mobility. Respiratory Examination: clear to  auscultation. CV Examination: RRR, no murmurs, no S3                         or S4. Prophylactic Antibiotics: The patient does not                         require prophylactic antibiotics. Prior                          Anticoagulants: The patient has taken no anticoagulant                         or antiplatelet agents. ASA Grade Assessment: III - A                         patient with severe systemic disease. After reviewing                         the risks and benefits, the patient was deemed in                         satisfactory condition to undergo the procedure. The                         anesthesia plan was to use monitored anesthesia care                         (MAC). Immediately prior to administration of                         medications, the patient was re-assessed for adequacy                         to receive sedatives. The heart rate, respiratory                         rate, oxygen saturations, blood pressure, adequacy of                         pulmonary ventilation, and response to care were                         monitored throughout the procedure. The physical                         status of the patient was re-assessed after the                         procedure.                        After obtaining informed consent, the colonoscope was                         passed under direct vision. Throughout the procedure,                         the patient's blood pressure, pulse, and oxygen  saturations were monitored continuously. The                         Colonoscope was introduced through the anus and                         advanced to the the cecum, identified by appendiceal                         orifice and ileocecal valve. The colonoscopy was                         somewhat difficult due to a redundant colon and                         significant looping. Successful completion of the                         procedure was aided by changing the patient to a prone                         position, straightening and shortening the scope to                         obtain bowel loop reduction, using scope torsion and                         lavage.  The patient tolerated the procedure well. The                         quality of the bowel preparation was evaluated using                         the BBPS [facility] Bowel Preparation Scale) with scores                         of: Right Colon = 2 (minor amount of residual                         staining, small fragments of stool and/or opaque                         liquid, but mucosa seen well), Transverse Colon = 2                         (minor amount of residual staining, small fragments of                         stool and/or opaque liquid, but mucosa seen well) and                         Left Colon = 3 (entire mucosa seen well with no                         residual staining, small fragments of stool or opaque  liquid). The total BBPS score equals 7. The quality of                         the bowel preparation was good. The ileocecal valve,                         appendiceal orifice, and rectum were photographed. Findings:      The perianal and digital rectal examinations were normal. Pertinent       negatives include normal sphincter tone.      Multiple small-mouthed diverticula were found in the left colon.       Estimated blood loss: none.      Non-bleeding internal hemorrhoids were found during retroflexion. The       hemorrhoids were Grade I (internal hemorrhoids that do not prolapse).       Estimated blood loss: none.      A 1 to 2 mm polyp was found in the ascending colon. The polyp was       sessile. The polyp was removed with a jumbo cold forceps. Resection and       retrieval were complete. Estimated blood loss was minimal.      The lumen of the colon (entire examined portion) was moderately dilated.       Estimated blood loss: none.      The exam was otherwise without abnormality on direct and retroflexion       views. Impression:            - Diverticulosis in the left colon.                        - Non-bleeding internal hemorrhoids.                         - One 1 to 2 mm polyp in the ascending colon, removed                         with a jumbo cold forceps. Resected and retrieved.                        - Dilated in the entire examined colon.                        - The examination was otherwise normal on direct and                         retroflexion views. Recommendation:        - Patient has a contact number available for                         emergencies. The signs and symptoms of potential                         delayed complications were discussed with the patient.                         Return to normal activities tomorrow. Written                         discharge instructions were provided to the  patient.                        - Discharge patient to home.                        - Resume previous diet.                        - Continue present medications.                        - Await pathology results.                        - Repeat colonoscopy in 5 years for surveillance.                        - Return to referring physician as previously                         scheduled.                        - The findings and recommendations were discussed with                         the patient. Procedure Code(s):     --- Professional ---                        [phone], Colonoscopy, flexible; with biopsy, single or                         multiple Diagnosis Code(s):     --- Professional ---                        Z86.010, Personal history of colonic polyps                        K64.0, First degree hemorrhoids                        D12.2, Benign neoplasm of ascending colon                        K59.39, Other megacolon                        K57.30, Diverticulosis of large intestine without                         perforation or abscess without bleeding CPT copyright 2022 American Medical Association. All rights reserved. The codes documented in this report are preliminary and upon coder review may  be revised to  meet current compliance requirements. Attending Participation:      I personally performed the entire procedure. Elfredia Nevins, DO Jaynie Collins DO, DO 08/09/2023 10:21:18 AM This report has been signed electronically. Number of Addenda: 0 Note Initiated On: 08/09/2023 9:15 AM Scope Withdrawal Time: 0 hours 8 minutes 13 seconds  Total Procedure Duration: 0 hours 28 minutes 14 seconds  Estimated Blood Loss:  Estimated blood loss was minimal.  [facility]

## 2023-08-09 NOTE — Anesthesia Preprocedure Evaluation (Signed)
Anesthesia Evaluation  Patient identified by MRN, date of birth, ID band Patient awake    Reviewed: Allergy & Precautions, NPO status , Patient's Chart, lab work & pertinent test results  History of Anesthesia Complications Negative for: history of anesthetic complications  Airway Mallampati: III  TM Distance: <3 FB Neck ROM: full    Dental  (+) Chipped   Pulmonary shortness of breath and with exertion, COPD, former smoker   Pulmonary exam normal        Cardiovascular Exercise Tolerance: Good hypertension, Normal cardiovascular exam+ Valvular Problems/Murmurs      Neuro/Psych  Headaches PSYCHIATRIC DISORDERS       Neuromuscular disease    GI/Hepatic Neg liver ROS,GERD  ,,  Endo/Other  negative endocrine ROS    Renal/GU negative Renal ROS  negative genitourinary   Musculoskeletal   Abdominal   Peds  Hematology negative hematology ROS (+)   Anesthesia Other Findings Past Medical History: No date: Alcohol abuse 1983: Allergy No date: Anemia No date: Anxiety No date: Arthritis No date: Cataract No date: Cervicalgia 1994: Cirrhosis (HCC) No date: COPD (chronic obstructive pulmonary disease) (HCC) No date: Depression No date: Difficult intubation     Comment:  has plates and screws in neck No date: Dyspnea No date: GERD (gastroesophageal reflux disease) No date: Headache No date: Heart murmur     Comment:  on heard when pt is lying No date: Hypertension No date: Neuromuscular disorder (HCC)     Comment:  Neuropathy No date: Other and unspecified hyperlipidemia No date: Oxygen deficiency No date: Status post insertion of spinal cord stimulator No date: Tachycardia     Comment:  d/t questionable anxiety happens every 5- 10 years, sees              [facility]  Past Surgical History: 2012, 2015, 2018: ANTERIOR CERVICAL DECOMP/DISCECTOMY FUSION     Comment:  x3 1982: AUGMENTATION MAMMAPLASTY;  Bilateral 1981: BREAST ENHANCEMENT SURGERY 11/11/2011: CARPAL TUNNEL RELEASE     Comment:  Procedure: CARPAL TUNNEL RELEASE;  Surgeon: [name], MD;  Location: [facility] NEURO ORS;  Service:               Neurosurgery;  Laterality: Right;  Right Median Nerve               Decompression 2013: CARPAL TUNNEL RELEASE 11/01/2017: COLONOSCOPY WITH PROPOFOL; N/A     Comment:  Procedure: COLONOSCOPY WITH PROPOFOL;  Surgeon: [name], MD;  Location: [facility] ENDOSCOPY;  Service:               Endoscopy;  Laterality: N/A; No date: ESOPHAGOGASTRODUODENOSCOPY 2022: JOINT REPLACEMENT 08/15/2019: LUMBAR LAMINECTOMY/DECOMPRESSION MICRODISCECTOMY; N/A     Comment:  Procedure: Lumbar three to Sacral one Decompressive               lumbar laminectomy;  Surgeon: [name], MD;                Location: [facility] OR;  Service: Neurosurgery;  Laterality:               N/A; No date: neck disc surgery     Comment:  plates and screws in neck x 2 06/16/2016: ROTATOR CUFF REPAIR     Comment:  right shoulder  No date: ROTATOR CUFF REPAIR 03/17/2021: SPINAL CORD STIMULATOR INSERTION; N/A  Comment:  Procedure: THORACIC SPINAL CORD STIMULATOR               (PERCUTANEOUS) & PULSE GENERATOR PLACEMENT;  Surgeon:               [name], MD;  Location: [facility] ORS;  Service:               Neurosurgery;  Laterality: N/A; 2020: SPINE SURGERY 1973: TONSILLECTOMY AND ADENOIDECTOMY 04/15/2021: TOTAL HIP ARTHROPLASTY; Right     Comment:  Procedure: TOTAL HIP ARTHROPLASTY ANTERIOR APPROACH;                Surgeon: [name], MD;  Location: [facility] ORS;                Service: Orthopedics;  Laterality: Right;  BMI    Body Mass Index: 24.81 kg/m      Reproductive/Obstetrics negative OB ROS                             Anesthesia Physical Anesthesia Plan  ASA: 3  Anesthesia Plan: General   Post-op Pain Management:    Induction: Intravenous  PONV Risk  Score and Plan: Propofol infusion and TIVA  Airway Management Planned: Natural Airway and Nasal Cannula  Additional Equipment:   Intra-op Plan:   Post-operative Plan:   Informed Consent: I have reviewed the patients History and Physical, chart, labs and discussed the procedure including the risks, benefits and alternatives for the proposed anesthesia with the patient or authorized representative who has indicated his/her understanding and acceptance.     Dental Advisory Given  Plan Discussed with: Anesthesiologist, CRNA and Surgeon  Anesthesia Plan Comments: (Patient consented for risks of anesthesia including but not limited to:  - adverse reactions to medications - risk of airway placement if required - damage to eyes, teeth, lips or other oral mucosa - nerve damage due to positioning  - sore throat or hoarseness - Damage to heart, brain, nerves, lungs, other parts of body or loss of life  Patient voiced understanding and assent.)       Anesthesia Quick Evaluation

## 2023-08-10 LAB — SURGICAL PATHOLOGY

## 2023-08-10 NOTE — Progress Notes (Signed)
Non-identified Voicemail.  No Message Left. 

## 2023-08-12 ENCOUNTER — Encounter: Payer: Self-pay | Admitting: Gastroenterology

## 2023-08-25 ENCOUNTER — Ambulatory Visit (INDEPENDENT_AMBULATORY_CARE_PROVIDER_SITE_OTHER): Payer: Medicare Other

## 2023-08-25 DIAGNOSIS — Z23 Encounter for immunization: Secondary | ICD-10-CM | POA: Diagnosis not present

## 2023-09-02 ENCOUNTER — Encounter: Payer: Self-pay | Admitting: Pulmonary Disease

## 2023-09-03 ENCOUNTER — Other Ambulatory Visit: Payer: Self-pay | Admitting: Family Medicine

## 2023-09-03 ENCOUNTER — Other Ambulatory Visit: Payer: Self-pay | Admitting: Family

## 2023-09-03 ENCOUNTER — Other Ambulatory Visit: Payer: Self-pay | Admitting: Physician Assistant

## 2023-09-03 DIAGNOSIS — R600 Localized edema: Secondary | ICD-10-CM

## 2023-09-03 DIAGNOSIS — I10 Essential (primary) hypertension: Secondary | ICD-10-CM

## 2023-09-03 DIAGNOSIS — K219 Gastro-esophageal reflux disease without esophagitis: Secondary | ICD-10-CM

## 2023-09-03 DIAGNOSIS — J449 Chronic obstructive pulmonary disease, unspecified: Secondary | ICD-10-CM

## 2023-09-06 NOTE — Telephone Encounter (Signed)
Requested Prescriptions  Pending Prescriptions Disp Refills   montelukast (SINGULAIR) 10 MG tablet [Pharmacy Med Name: MONTELUKAST 10 MG TAB 10 Tablet] 30 tablet 6    Sig: TAKE 1 TABLET BY MOUTH EVERY DAY AT BEDTIME     Pulmonology:  Leukotriene Inhibitors Passed - 09/06/2023  8:09 AM      Passed - Valid encounter within last 12 months    Recent Outpatient Visits           1 month ago Follow-up exam, 3-6 months since previous exam   [facility], Monico Blitz, DO   7 months ago Essential hypertension   [facility] [name], PA-C   9 months ago Annual physical exam   [facility] [name], PA-C   1 year ago COPD exacerbation [facility])   [place] [facility] [name], PA-C   1 year ago Acute bronchitis with COPD [facility])   [place] [facility] [name], PA-C       Future Appointments             In 2 months [place], Monico Blitz, DO [facility]

## 2023-09-07 DIAGNOSIS — M0579 Rheumatoid arthritis with rheumatoid factor of multiple sites without organ or systems involvement: Secondary | ICD-10-CM | POA: Diagnosis not present

## 2023-09-07 DIAGNOSIS — Z79899 Other long term (current) drug therapy: Secondary | ICD-10-CM | POA: Diagnosis not present

## 2023-09-09 ENCOUNTER — Encounter: Payer: Self-pay | Admitting: Pulmonary Disease

## 2023-09-09 ENCOUNTER — Ambulatory Visit: Payer: Medicare Other | Admitting: Pulmonary Disease

## 2023-09-09 NOTE — Telephone Encounter (Signed)
Can you put her on the list for PFT. She missed her last appt. She will need an appt with Dr. Jayme Cloud after her PFT. Thank you!

## 2023-09-10 NOTE — Telephone Encounter (Signed)
I have pulled her PFT order to get scheduled when they give me appts for February

## 2023-09-21 ENCOUNTER — Ambulatory Visit: Payer: Medicare Other | Attending: Pulmonary Disease

## 2023-09-21 DIAGNOSIS — R0602 Shortness of breath: Secondary | ICD-10-CM | POA: Diagnosis not present

## 2023-09-21 DIAGNOSIS — J449 Chronic obstructive pulmonary disease, unspecified: Secondary | ICD-10-CM | POA: Insufficient documentation

## 2023-09-21 DIAGNOSIS — Z87891 Personal history of nicotine dependence: Secondary | ICD-10-CM | POA: Diagnosis not present

## 2023-09-21 LAB — PULMONARY FUNCTION TEST ARMC ONLY
DL/VA % pred: 51 %
DL/VA: 2.05 ml/min/mmHg/L
DLCO unc % pred: 43 %
DLCO unc: 9.99 ml/min/mmHg
FEF 25-75 Post: 0.81 L/s
FEF 25-75 Pre: 0.89 L/s
FEF2575-%Change-Post: -8 %
FEF2575-%Pred-Post: 34 %
FEF2575-%Pred-Pre: 38 %
FEV1-%Change-Post: -2 %
FEV1-%Pred-Post: 53 %
FEV1-%Pred-Pre: 55 %
FEV1-Post: 1.53 L
FEV1-Pre: 1.57 L
FEV1FVC-%Change-Post: -3 %
FEV1FVC-%Pred-Pre: 82 %
FEV6-%Change-Post: 1 %
FEV6-%Pred-Post: 69 %
FEV6-%Pred-Pre: 68 %
FEV6-Post: 2.49 L
FEV6-Pre: 2.46 L
FEV6FVC-%Change-Post: 0 %
FEV6FVC-%Pred-Post: 103 %
FEV6FVC-%Pred-Pre: 103 %
FVC-%Change-Post: 1 %
FVC-%Pred-Post: 67 %
FVC-%Pred-Pre: 66 %
FVC-Post: 2.51 L
FVC-Pre: 2.48 L
Post FEV1/FVC ratio: 61 %
Post FEV6/FVC ratio: 99 %
Pre FEV1/FVC ratio: 63 %
Pre FEV6/FVC Ratio: 99 %
RV % pred: 130 %
RV: 3.11 L
TLC % pred: 102 %
TLC: 5.93 L

## 2023-09-21 MED ORDER — ALBUTEROL SULFATE (2.5 MG/3ML) 0.083% IN NEBU
2.5000 mg | INHALATION_SOLUTION | Freq: Once | RESPIRATORY_TRACT | Status: AC
  Administered 2023-09-21: 2.5 mg via RESPIRATORY_TRACT
  Filled 2023-09-21: qty 3

## 2023-09-22 ENCOUNTER — Telehealth: Payer: Self-pay | Admitting: Pharmacist

## 2023-09-22 ENCOUNTER — Other Ambulatory Visit: Payer: Self-pay | Admitting: Pharmacist

## 2023-09-22 ENCOUNTER — Other Ambulatory Visit: Payer: Self-pay | Admitting: Family

## 2023-09-22 ENCOUNTER — Encounter: Payer: Self-pay | Admitting: Pharmacist

## 2023-09-22 DIAGNOSIS — J449 Chronic obstructive pulmonary disease, unspecified: Secondary | ICD-10-CM

## 2023-09-22 DIAGNOSIS — K219 Gastro-esophageal reflux disease without esophagitis: Secondary | ICD-10-CM

## 2023-09-22 DIAGNOSIS — I10 Essential (primary) hypertension: Secondary | ICD-10-CM

## 2023-09-22 NOTE — Patient Instructions (Addendum)
 Goals Addressed             This Visit's Progress    Pharmacy Goals       If you need to reach out to patient assistance programs regarding refills or to find out the status of your application, you can do so by calling:  For Linzess, [employer] at [phone] For [place], [place] at [phone]  Check your blood pressure once-twice weekly, and any time you have concerning symptoms like headache, chest pain, dizziness, shortness of breath, or vision changes.   Our goal is less than 130/80.  To appropriately check your blood pressure, make sure you do the following:  1) Avoid caffeine, exercise, or tobacco products for 30 minutes before checking. Empty your bladder. 2) Sit with your back supported in a flat-backed chair. Rest your arm on something flat (arm of the chair, table, etc). 3) Sit still with your feet flat on the floor, resting, for at least 5 minutes.  4) Check your blood pressure. Take 1-2 readings.  5) Write down these readings and bring with you to any provider appointments.  Bring your home blood pressure machine with you to a provider's office for accuracy comparison at least once a year.   Make sure you take your blood pressure medications before you come to any office visit, even if you were asked to fast for labs.  Estelle Grumbles, PharmD, [facility] [phone]

## 2023-09-22 NOTE — Progress Notes (Signed)
   Outreach Note  09/22/2023 Name: Gabrielle Pineda MRN: [id number] DOB: 11/02/1955  Referred by: [name], DO Reason for referral : Medication Assistance  Was unable to reach patient via telephone today x 3 and unable to leave a message as patient's voicemail is full   Follow Up Plan: Will collaborate with Care Guide to outreach to schedule follow up with me  Sharyle Sia, PharmD, [facility] [phone]

## 2023-09-22 NOTE — Progress Notes (Signed)
 09/22/2023 Name: RAIGAN BARIA MRN: [id number] DOB: Nov 17, 1955  Chief Complaint  Patient presents with   Medication Assistance    Ilene Z Platte is a 68 y.o. year old female who presented for a telephone visit.   They were referred to the pharmacist by their PCP for assistance in managing medication access.    Subjective:  Care Team: Primary Care Provider: [name], DO; Next Scheduled Visit: 11/26/2023 Rheumatology: Tobie Lady Plumb, MD Hematology: Babara Call, MD Pain Specialist: Marcelino Nurse, MD; Next Scheduled Visit: 10/26/2023 Pulmonology: Tamea Dedra CROME, MD Psychiatry: Eappen, Saramma, MD; Next Scheduled Visit: 09/28/2023 Neurologist: Jonette Lauraine Norris, PA GI Specialist: Moishe Suzen Caldron, NP  Medication Access/Adherence  Current Pharmacy[facility]  [facility] [place], [place] - [address] [place] [address] Phone: [phone] Fax: [phone]  [facility] - [place], [place] - [address] [place] [address] [place] [address] Phone: [phone] Fax: [phone]   Patient reports affordability concerns with their medications: No Patient reports access/transportation concerns to their pharmacy: No  Patient reports adherence concerns with their medications:  No    Note patient approved for patient assistance for Linzess  from [employer] 04/09/2023-08/16/2024    COPD/Allergic Rhinitis:   Current medications: - Breztri  inhaler - 2 puffs in morning and 2 puffs at bedtime - albuterol  nebulizer solution - 1 vial via nebulizer every 4 hours as needed for wheezing/shortness of breath - albuterol  inhaler - 2 puffs every 6 hours as needed for wheezing/shortness of breath - Fexofenadine 180 mg daily - montelukast  10 mg QHS   Confirms rinsing out mouth after each use of Breztri  inhaler   Current medication access support: Enrolled in patient assistance for Breztri  inhaler from [employer] through  08/16/2024   Objective:  Lab Results  Component Value Date   CREATININE 1.33 (H) 07/19/2023   BUN 14 07/19/2023   NA 142 07/19/2023   K 4.0 07/19/2023   CL 108 (H) 07/19/2023   CO2 21 07/19/2023    Lab Results  Component Value Date   CHOL 184 07/19/2023   HDL 60 07/19/2023   LDLCALC 97 07/19/2023   TRIG 156 (H) 07/19/2023   CHOLHDL 3.1 07/19/2023    Current Outpatient Medications on File Prior to Visit  Medication Sig Dispense Refill   acyclovir  ointment (ZOVIRAX ) 5 % :Topical Every 3 Hours as needed for lesion 30 g 1   albuterol  (PROVENTIL ) (2.5 MG/3ML) 0.083% nebulizer solution Take 3 mLs (2.5 mg total) by nebulization every 4 (four) hours as needed for wheezing or shortness of breath. 225 mL 2   albuterol  (VENTOLIN  HFA) 108 (90 Base) MCG/ACT inhaler INHALE TWO (2) PUFFS BY MOUTH AND INTO THE LUNGS EVERY 6 HOURS AS NEEDED FOR SHORTNESS OF BREATH 8.5 g 10   Atogepant 60 MG TABS Take by mouth.     atorvastatin  (LIPITOR) 40 MG tablet Take 1 tablet (40 mg total) by mouth daily. 90 tablet 3   Budeson-Glycopyrrol-Formoterol  (BREZTRI  AEROSPHERE) 160-9-4.8 MCG/ACT AERO Inhale 2 puffs into the lungs in the morning and at bedtime. 3 each 3   carvedilol  (COREG ) 12.5 MG tablet TAKE ONE TABLET BY MOUTH AT BREAKFAST AND AT BEDTIME 180 tablet 3   ezetimibe  (ZETIA ) 10 MG tablet Take 1 tablet (10 mg total) by mouth daily. 90 tablet 3   fexofenadine (ALLEGRA) 180 MG tablet Take 180 mg by mouth daily.     furosemide  (LASIX ) 20 MG tablet TAKE  1 TABLET BY MOUTH ONCE DAILY 30 tablet 2   gabapentin  (NEURONTIN ) 400 MG capsule Take 1 capsule (400 mg total) by mouth at bedtime. 30 capsule 5   hydrOXYzine  (ATARAX ) 10 MG tablet TAKE 1-2 TABLETS BY MOUTH TWICE DAILY AS NEEDED FOR SEVERE ANXIETY ATTACKS ONLY 120 tablet 10   ipratropium (ATROVENT ) 0.03 % nasal spray Place 2 sprays into both nostrils every 12 (twelve) hours. 30 mL 12   leflunomide  (ARAVA ) 20 MG tablet Take 20 mg by mouth at bedtime.      losartan  (COZAAR ) 25 MG tablet TAKE ONE TABLET BY MOUTH EVERYDAY AT BEDTIME 90 tablet 1   meloxicam  (MOBIC ) 15 MG tablet Take 15 mg by mouth at bedtime.     mirtazapine  (REMERON ) 15 MG tablet TAKE 1 TABLET BY MOUTH EVERY DAY AT BEDTIME 30 tablet 10   montelukast  (SINGULAIR ) 10 MG tablet TAKE 1 TABLET BY MOUTH EVERY DAY AT BEDTIME 30 tablet 6   Multiple Vitamin (MULTIVITAMIN WITH MINERALS) TABS tablet Take 1 tablet by mouth daily.     Nebulizer MISC Compressor nebulizer to use with nebulized medications as instructed 1 each 0   omeprazole  (PRILOSEC) 40 MG capsule TAKE ONE CAPSULE BY MOUTH AT BREAKFAST AND AT BEDTIME 180 capsule 1   oxyCODONE -acetaminophen  (PERCOCET) 5-325 MG tablet Take 1 tablet by mouth every 12 (twelve) hours as needed for severe pain (pain score 7-10). Must last 30 days. 60 tablet 0   [START ON 09/30/2023] oxyCODONE -acetaminophen  (PERCOCET) 5-325 MG tablet Take 1 tablet by mouth every 12 (twelve) hours as needed for severe pain (pain score 7-10). Must last 30 days. 60 tablet 0   potassium chloride  SA (KLOR-CON  M) 20 MEQ tablet TAKE 1 TABLET BY MOUTH EVERY MORNING 90 tablet 1   topiramate  (TOPAMAX ) 25 MG tablet Take 1 tablet (25 mg total) by mouth 2 (two) times daily. 60 tablet 11   valACYclovir  (VALTREX ) 1000 MG tablet Take 2 tablets (2,000 mg total) by mouth daily. 10 tablet 1   venlafaxine  XR (EFFEXOR -XR) 75 MG 24 hr capsule TAKE (3) CAPSULES BY MOUTH EVERY MORNING 90 capsule 10   XIIDRA  5 % SOLN Place 1 drop into both eyes daily.     No current facility-administered medications on file prior to visit.        Assessment/Plan:   Patient to follow up with [place] assistance program as needed for refills of Breztri  inhaler and with [employer] as needed for refills of Linzess    COPD: - Reviewed appropriate inhaler technique.   Follow Up Plan: Clinical Pharmacist will follow up with patient by telephone on 05/31/2024 at 9:00 AM    Sharyle Sia, PharmD, [facility] [phone]

## 2023-09-24 ENCOUNTER — Encounter: Payer: Self-pay | Admitting: Pulmonary Disease

## 2023-09-24 ENCOUNTER — Ambulatory Visit: Payer: Medicare Other | Admitting: Pulmonary Disease

## 2023-09-24 VITALS — BP 118/72 | HR 68 | Temp 97.1°F | Ht 69.0 in | Wt 170.8 lb

## 2023-09-24 DIAGNOSIS — Z87891 Personal history of nicotine dependence: Secondary | ICD-10-CM | POA: Diagnosis not present

## 2023-09-24 DIAGNOSIS — G4736 Sleep related hypoventilation in conditions classified elsewhere: Secondary | ICD-10-CM

## 2023-09-24 DIAGNOSIS — J449 Chronic obstructive pulmonary disease, unspecified: Secondary | ICD-10-CM | POA: Diagnosis not present

## 2023-09-24 DIAGNOSIS — J4489 Other specified chronic obstructive pulmonary disease: Secondary | ICD-10-CM | POA: Diagnosis not present

## 2023-09-24 NOTE — Patient Instructions (Signed)
 VISIT SUMMARY:  Gabrielle Pineda, during your follow-up visit, we discussed your Chronic Obstructive Pulmonary Disease (COPD) and reviewed your recent pulmonary function test results. You have shown improvement in your breathing, and you are effectively using your prescribed treatments. You have also successfully quit smoking, which is excellent for your overall health.  YOUR PLAN:  -CHRONIC OBSTRUCTIVE PULMONARY DISEASE (COPD): COPD is a chronic lung condition that makes it hard to breathe. Your recent test results show improvement, with your lung function increasing from 49% to 55%. You should continue using your prescribed inhalers and oxygen  therapy at night as they are helping you. It's great that you haven't needed your emergency inhaler recently. Keep up the good work with your treatments and not smoking.  INSTRUCTIONS:  Please continue with your current inhaler regimen and nocturnal oxygen  therapy. We will schedule a follow-up appointment in 6 months to monitor your progress.

## 2023-09-24 NOTE — Progress Notes (Signed)
 Subjective:    Patient ID: Gabrielle Pineda, female    DOB: 12/17/1955, 68 y.o.   MRN: [id number]  Patient Care Team: [name], DO as PCP - General (Family Medicine) Perla Evalene PARAS, MD as PCP - Cardiology (Cardiology) Eappen, Saramma, MD as Consulting Physician (Psychiatry) Marcelino Nurse, MD as Consulting Physician (Pain Medicine) Alana Sharyle LABOR, RPH-CPP as Pharmacist  Chief Complaint  Patient presents with   Follow-up    No SOB. Little wheezing. Little cough with clear/white sputum.    BACKGROUND/INTERVAL:This is a 68 year old female, former smoker quit in 2022 (80 PY), initially seen for pulmonary consult December 11, 2021 for shortness of breath and COPD, abnormal CT chest with lung nodularity.  She has rheumatoid arthritis and is maintained on Arava .  She presents today for follow-up.  Last seen here on 10 May 2023 by me.This is a scheduled visit.   HPI Discussed the use of AI scribe software for clinical note transcription with the patient, who gave verbal consent to proceed.  History of Present Illness   Gabrielle Pineda is a 68 year old female with COPD who presents for follow-up.  She has experienced an improvement in her breathing, as indicated by a recent pulmonary function test showing an increase from 49% to 55%.  She completed pulmonary rehab in May 2024.  She uses her prescribed breathing treatments regularly and finds them helpful. She has not needed to use her SQ inhaler recently.  She uses oxygen  at night, which she finds beneficial for her sleep will be. She reports sleeping well without any issues related to her sleep or breathing at night.  No relapse in smoking.  Overall she feels well and looks well.  No overt symptomatology today.    THERAPIES 07/27/2022 through 12/30/2022: Pulmonary rehab.  Patient experienced significant improvement in shortness of breath.   DATA 07/29/2017 simple spirometry: Performed for disability determination  FEV1 2.26 L or 79% predicted FVC 3.05 L or 86% predicted FEV1/FVC 74% 11/01/2020 [facility]): Normal left ventricle, LVEF over 55%, right ventricle normal in size with normal systolic function, mild tricuspid regurgitation, no pulmonary hypertension, no wall motion abnormalities 12/18/2021 CT high-res chest: No evidence of fibrotic interstitial lung disease, moderate emphysema and diffuse bronchial wall thickening, mild tracheobronchomalacia, irregular nodules of the left upper lobe and dependent right lower lobe measuring 8 mm, nonspecific.  Fine centrilobular nodularity concentrated in the lung bases consistent with smoking-related respiratory bronchiolitis, coronary artery disease 02/04/2022 PFTs: FEV1 1.45 L or 49% predicted, FVC 2.24 L or 59% predicted, FEV1/FVC 65%, lung volumes normal with modest air trapping,moderately to severe diffusion defect.  Flow volume loop consistent with airway obstruction, no evidence of fixed or variable upper airway obstruction.  There is significant decline in lung function when compared to the 2018 simple spirometry measurements 02/13/2022 overnight oximetry: Show desaturations of less than 88% for over 2 hours patient qualified for nocturnal oxygen  06/29/2022 CT chest: Previously noted left upper lobe and posterior right lower lobe pulmonary nodules resolved.  Tiny 3 mm anterior upper lobe pulmonary nodule suggest follow-up CT chest in 12 to 18 months.  Moderate centrilobular emphysema with mild diffuse bronchial wall thickening compatible with COPD 06/02/2023 echocardiogram: LVEF 60 to 65%, mild LVH.  Grade 1 DD.  Normal right ventricular function.  Mild mitral regurgitation, no other abnormalities 09/21/2023 PFTs: FEV1 1.57 L or 55% predicted, FVC 2.48 L or 66% predicted, FEV1/FVC 63%, lung volumes show mild air trapping otherwise normal.  Diffusion capacity  moderately to severely reduced.  Compared to prior PFTs there has been a modest improvement in FEV1 and  FVC  Review of Systems A 10 point review of systems was performed and it is as noted above otherwise negative.   Patient Active Problem List   Diagnosis Date Noted   Viral upper respiratory tract infection 07/29/2023   Nicotine  dependence, cigarettes, in remission 07/29/2023   Mitral valve stenosis 07/29/2023   Nocturnal hypoxemia due to obstructive chronic bronchitis (HCC) 05/10/2023   Memory loss or impairment 04/22/2023   Localized edema 11/23/2022   MDD (major depressive disorder), recurrent, in full remission (HCC) 03/31/2022   Macrocytic anemia 02/20/2022   Overweight (BMI 25.0-29.9) 02/20/2022   COPD (chronic obstructive pulmonary disease) (HCC) 02/06/2022   Lung nodules 02/06/2022   Allergic rhinitis 02/06/2022   GAD (generalized anxiety disorder) 12/26/2021   Alcohol use disorder, moderate, in sustained remission (HCC) 12/26/2021   Need for vaccination against Streptococcus pneumoniae 11/17/2021   Sleep disorder 10/23/2021   Iron deficiency anemia 06/26/2021   Depression, major, single episode, moderate (HCC) 06/26/2021   Status post total hip replacement, right 04/15/2021   Chronic radicular lumbar pain 10/03/2020   Postlaminectomy syndrome, lumbar region 06/03/2020   Moderate episode of recurrent major depressive disorder (HCC) 12/04/2019   Spondylosis of cervical region without myelopathy or radiculopathy 09/21/2019   DDD (degenerative disc disease), cervical 09/21/2019   Cervicalgia 09/21/2019   Cervical fusion syndrome 09/21/2019   Chronic pain syndrome 09/21/2019   Spinal stenosis, lumbar region, with neurogenic claudication 08/15/2019   History of adenomatous polyp of colon 11/04/2017   Cervical radiculopathy 08/02/2017   Neuropathy 08/02/2017   Essential hypertension 07/28/2017   Closed compression fracture of L5 vertebra (HCC) 07/07/2017   Sacral insufficiency fracture with routine healing 07/07/2017   Family history of malignant neoplasm of pancreas  04/03/2015   Hypercholesteremia 04/03/2015   Disorder of iron metabolism 04/03/2015   Carpal tunnel syndrome 03/29/2015   Acid reflux 10/11/2014   Closed fracture of distal phalanx of thumb 08/15/2014   Arthritis, degenerative 01/30/2014   Arthritis or polyarthritis, rheumatoid (HCC) 01/30/2014   Paroxysmal supraventricular tachycardia (HCC) 09/22/2010   B-complex deficiency 09/09/2007   Constipation 06/26/2007   Clinical depression 06/26/2007   Cold sore 06/26/2007   H/O alcohol abuse 06/26/2007   Cannot sleep 06/26/2007   Localized osteoarthrosis, hand 06/26/2007   Menopausal symptom 06/26/2007    Social History   Tobacco Use   Smoking status: Former    Current packs/day: 0.00    Average packs/day: 2.0 packs/day for 45.0 years (90.0 ttl pk-yrs)    Types: Cigarettes    Start date: 10/29/1975    Quit date: 10/28/2020    Years since quitting: 2.9   Smokeless tobacco: Never   Tobacco comments:    Quit smoking in 2022- khj 09/24/2023  Substance Use Topics   Alcohol use: No    Comment: recovering alcoholic Since 1994     Allergies  Allergen Reactions   Sulfasalazine Other (See Comments)   Plaquenil [Hydroxychloroquine] Rash    Current Meds  Medication Sig   acyclovir  ointment (ZOVIRAX ) 5 % :Topical Every 3 Hours as needed for lesion   albuterol  (VENTOLIN  HFA) 108 (90 Base) MCG/ACT inhaler INHALE TWO (2) PUFFS BY MOUTH AND INTO THE LUNGS EVERY 6 HOURS AS NEEDED FOR SHORTNESS OF BREATH   Atogepant 60 MG TABS Take by mouth.   atorvastatin  (LIPITOR) 40 MG tablet Take 1 tablet (40 mg total) by mouth daily.  Budeson-Glycopyrrol-Formoterol  (BREZTRI  AEROSPHERE) 160-9-4.8 MCG/ACT AERO Inhale 2 puffs into the lungs in the morning and at bedtime.   carvedilol  (COREG ) 12.5 MG tablet TAKE ONE TABLET BY MOUTH AT BREAKFAST AND AT BEDTIME   ezetimibe  (ZETIA ) 10 MG tablet Take 1 tablet (10 mg total) by mouth daily.   fexofenadine (ALLEGRA) 180 MG tablet Take 180 mg by mouth daily.    furosemide  (LASIX ) 20 MG tablet TAKE 1 TABLET BY MOUTH ONCE DAILY   gabapentin  (NEURONTIN ) 400 MG capsule Take 1 capsule (400 mg total) by mouth at bedtime.   hydrOXYzine  (ATARAX ) 10 MG tablet TAKE 1-2 TABLETS BY MOUTH TWICE DAILY AS NEEDED FOR SEVERE ANXIETY ATTACKS ONLY   ipratropium (ATROVENT ) 0.03 % nasal spray Place 2 sprays into both nostrils every 12 (twelve) hours.   leflunomide  (ARAVA ) 20 MG tablet Take 20 mg by mouth at bedtime.   losartan  (COZAAR ) 25 MG tablet TAKE ONE TABLET BY MOUTH EVERYDAY AT BEDTIME   meloxicam  (MOBIC ) 15 MG tablet Take 15 mg by mouth at bedtime.   mirtazapine  (REMERON ) 15 MG tablet TAKE 1 TABLET BY MOUTH EVERY DAY AT BEDTIME   montelukast  (SINGULAIR ) 10 MG tablet TAKE 1 TABLET BY MOUTH EVERY DAY AT BEDTIME   Multiple Vitamin (MULTIVITAMIN WITH MINERALS) TABS tablet Take 1 tablet by mouth daily.   Nebulizer MISC Compressor nebulizer to use with nebulized medications as instructed   omeprazole  (PRILOSEC) 40 MG capsule TAKE ONE CAPSULE BY MOUTH AT BREAKFAST AND AT BEDTIME   oxyCODONE -acetaminophen  (PERCOCET) 5-325 MG tablet Take 1 tablet by mouth every 12 (twelve) hours as needed for severe pain (pain score 7-10). Must last 30 days.   [START ON 09/30/2023] oxyCODONE -acetaminophen  (PERCOCET) 5-325 MG tablet Take 1 tablet by mouth every 12 (twelve) hours as needed for severe pain (pain score 7-10). Must last 30 days.   potassium chloride  SA (KLOR-CON  M) 20 MEQ tablet TAKE 1 TABLET BY MOUTH EVERY MORNING   topiramate  (TOPAMAX ) 25 MG tablet Take 1 tablet (25 mg total) by mouth 2 (two) times daily.   valACYclovir  (VALTREX ) 1000 MG tablet Take 2 tablets (2,000 mg total) by mouth daily.   venlafaxine  XR (EFFEXOR -XR) 75 MG 24 hr capsule TAKE (3) CAPSULES BY MOUTH EVERY MORNING   XIIDRA  5 % SOLN Place 1 drop into both eyes daily.    Immunization History  Administered Date(s) Administered   Fluad Quad(high Dose 65+) 10/01/2021, 05/14/2022   Fluad Trivalent(High Dose 65+)  08/25/2023   Influenza Split 09/07/2012   Influenza,inj,Quad PF,6+ Mos 11/18/2015, 07/23/2016, 07/02/2017, 04/14/2018, 04/17/2019, 04/19/2020   Influenza-Unspecified 09/24/2014   PFIZER(Purple Top)SARS-COV-2 Vaccination 11/21/2019, 12/12/2019   PNEUMOCOCCAL CONJUGATE-20 11/17/2021   Pneumococcal Polysaccharide-23 02/12/2011, 09/24/2014, 04/19/2020   Tdap 02/12/2011, 11/15/2015        Objective:     BP 118/72 (BP Location: Right Arm, Cuff Size: Normal)   Pulse 68   Temp (!) 97.1 F (36.2 C)   Ht 5' 9 (1.753 m)   Wt 170 lb 12.8 oz (77.5 kg)   SpO2 97%   BMI 25.22 kg/m   SpO2: 97 % O2 Device: None (Room air)  GENERAL: Well-developed, overweight woman, no acute distress.  No use of accessory use of respiration.  Fully ambulatory.  No conversational dyspnea. HEAD: Normocephalic, atraumatic.  EYES: Pupils equal, round, reactive to light.  No scleral icterus.  MOUTH: Poor dentition, oral mucosa moist.  No thrush. NECK: Supple. No thyromegaly. Trachea midline. No JVD.  No adenopathy. PULMONARY: Good air entry bilaterally.  Coarse, otherwise  no adventitious sounds. CARDIOVASCULAR: S1 and S2. Regular rate and rhythm.  Grade 2/6 systolic ejection murmur left sternal border.  This appears to be new. ABDOMEN: Benign. MUSCULOSKELETAL: Stigmata of rheumatoid arthritis on both hands, no clubbing, no edema.  NEUROLOGIC: No overt focal deficit.  No gait disturbance.  Speech is fluent. SKIN: Intact,warm,dry. PSYCH: Mood and behavior normal.    Assessment & Plan:     ICD-10-CM   1. Stage 3 severe COPD by GOLD classification (HCC)  J44.9     2. Nocturnal hypoxemia due to obstructive chronic bronchitis (HCC)  J44.89    G47.36     3. Former cigarette smoker  Z87.891       Discussion:    Chronic Obstructive Pulmonary Disease (COPD) COPD with modest improved pulmonary function test results (FEV1 improved from 49% to 55%). She has quit smoking and reports effective use of prescribed  inhalers and nocturnal oxygen  therapy. No recent use of rescue inhaler. Physical exam reveals clear lung sounds. No adverse effects from current medications. - Continue current inhaler regimen - Continue nocturnal oxygen  therapy - Follow-up in 6 months.   Nocturnal hypoxemia due to COPD Patient compliant with oxygen  therapy, notes benefit of the same. - Continue nocturnal oxygen  supplementation  Former smoker No evidence of relapse.  Smoking cessation instruction/counseling given:  commended patient for quitting and reviewed strategies for preventing relapses.  Advised if symptoms do not improve or worsen, to please contact office for sooner follow up or seek emergency care.    I spent 30 minutes of dedicated to the care of this patient on the date of this encounter to include pre-visit review of records, face-to-face time with the patient discussing conditions above, post visit ordering of testing, clinical documentation with the electronic health record, making appropriate referrals as documented, and communicating necessary findings to members of the patients care team.     C. Leita Sanders, MD Advanced Bronchoscopy P[facility]    *This note was generated using voice recognition software/Dragon and/or AI transcription program.  Despite best efforts to proofread, errors can occur which can change the meaning. Any transcriptional errors that result from this process are unintentional and may not be fully corrected at the time of dictation.

## 2023-09-28 ENCOUNTER — Encounter: Payer: Self-pay | Admitting: Psychiatry

## 2023-09-28 ENCOUNTER — Telehealth (INDEPENDENT_AMBULATORY_CARE_PROVIDER_SITE_OTHER): Payer: Medicare Other | Admitting: Psychiatry

## 2023-09-28 DIAGNOSIS — F3342 Major depressive disorder, recurrent, in full remission: Secondary | ICD-10-CM | POA: Diagnosis not present

## 2023-09-28 DIAGNOSIS — F1021 Alcohol dependence, in remission: Secondary | ICD-10-CM | POA: Diagnosis not present

## 2023-09-28 DIAGNOSIS — F411 Generalized anxiety disorder: Secondary | ICD-10-CM | POA: Diagnosis not present

## 2023-09-28 DIAGNOSIS — F5101 Primary insomnia: Secondary | ICD-10-CM | POA: Diagnosis not present

## 2023-09-28 MED ORDER — BUSPIRONE HCL 7.5 MG PO TABS
7.5000 mg | ORAL_TABLET | Freq: Two times a day (BID) | ORAL | 1 refills | Status: DC
Start: 2023-09-28 — End: 2023-10-17

## 2023-09-28 MED ORDER — VENLAFAXINE HCL ER 75 MG PO CP24
150.0000 mg | ORAL_CAPSULE | Freq: Every day | ORAL | 1 refills | Status: DC
Start: 2023-09-28 — End: 2023-11-03

## 2023-09-28 NOTE — Progress Notes (Unsigned)
Virtual Visit via Video Note  I connected with Gabrielle Pineda on 09/28/23 at  4:30 PM EST by a video enabled telemedicine application and verified that I am speaking with the correct person using two identifiers.  Location Provider Location : [facility] Patient Location : Home  Participants: Patient ,Spouse , Provider    I discussed the limitations of evaluation and management by telemedicine and the availability of in person appointments. The patient expressed understanding and agreed to proceed.   I discussed the assessment and treatment plan with the patient. The patient was provided an opportunity to ask questions and all were answered. The patient agreed with the plan and demonstrated an understanding of the instructions.   The patient was advised to call back or seek an in-person evaluation if the symptoms worsen or if the condition fails to improve as anticipated.   [facility] MD OP Progress Note  09/29/2023 2:17 PM Gabrielle Pineda  MRN:  [id number]  Chief Complaint:  Chief Complaint  Patient presents with   Follow-up   Depression   Anxiety   Medication Refill   HPI: Gabrielle Pineda is a 68 year old Caucasian female on disability, lives in [place], has a history of MDD, GAD, primary insomnia, alcohol use disorder in remission, history of back pain, history of spinal cord stimulator, paroxysmal ventricular tachycardia, hypercholesterolemia, chronic pain syndrome, GERD, was evaluated by telemedicine today.  She has a history of generalized anxiety disorder, which was exacerbated in the past, leading to panic attacks. She is currently taking venlafaxine and mirtazapine for anxiety management. Since starting psychotherapy with Miss Gabrielle Pineda, she has not experienced any panic attacks. However, she feels the therapy sessions lack focus as the therapist is unsure of the purpose of her visits.  Her depression is currently stable on venlafaxine and mirtazapine. In the past two weeks, she  has not experienced significant depression, lack of motivation, suicidal thoughts, or self-injurious behaviors. She has a history of a suicide attempt during her teenage years when she ingested a large amount of aspirin with the intent to die.  She experiences insomnia, which is managed with her current medication regimen. There are no new issues reported regarding her sleep.  She has stage three COPD and uses oxygen at night, which she finds helpful. There are no new symptoms or changes in her respiratory status reported.  Her renal function was noted to be abnormal in December, 07/19/2023, with a glomerular filtration rate of 45, creatinine at 1.33.   Visit Diagnosis:    ICD-10-CM   1. Primary insomnia  F51.01     2. MDD (major depressive disorder), recurrent, in full remission (HCC)  F33.42 venlafaxine XR (EFFEXOR-XR) 75 MG 24 hr capsule    3. GAD (generalized anxiety disorder)  F41.1 venlafaxine XR (EFFEXOR-XR) 75 MG 24 hr capsule    busPIRone (BUSPAR) 7.5 MG tablet    4. Alcohol use disorder, moderate, in sustained remission (HCC)  F10.21       Past Psychiatric History: I have reviewed past psychiatric history from progress note on 12/26/2021.  Trials of medications like sertraline, multiple other medications-does not remember all the names.  Patient today reports 1 episode of suicide attempt when she was a teenager when she overdosed on aspirin.  Past Medical History:  Past Medical History:  Diagnosis Date   Alcohol abuse    Allergy 1983   Anemia    Anxiety    Arthritis    Cataract    Cervicalgia  Cirrhosis (HCC) 1994   COPD (chronic obstructive pulmonary disease) (HCC)    Depression    Difficult intubation    has plates and screws in neck   Dyspnea    GERD (gastroesophageal reflux disease)    Headache    Heart murmur    on heard when pt is lying   Hypertension    Neuromuscular disorder (HCC)    Neuropathy   Other and unspecified hyperlipidemia    Oxygen  deficiency    Status post insertion of spinal cord stimulator    Tachycardia    d/t questionable anxiety happens every 5- 10 years, sees [facility]    Past Surgical History:  Procedure Laterality Date   ANTERIOR CERVICAL DECOMP/DISCECTOMY FUSION  2012, 2015, 2018   x3   AUGMENTATION MAMMAPLASTY Bilateral 1982   BREAST ENHANCEMENT SURGERY  1981   CARPAL TUNNEL RELEASE  11/11/2011   Procedure: CARPAL TUNNEL RELEASE;  Surgeon: [name], MD;  Location: [facility];  Service: Neurosurgery;  Laterality: Right;  Right Median Nerve Decompression   CARPAL TUNNEL RELEASE  2013   COLONOSCOPY WITH PROPOFOL N/A 11/01/2017   Procedure: COLONOSCOPY WITH PROPOFOL;  Surgeon: [name], MD;  Location: [facility] ENDOSCOPY;  Service: Endoscopy;  Laterality: N/A;   COLONOSCOPY WITH PROPOFOL N/A 08/09/2023   Procedure: COLONOSCOPY WITH PROPOFOL;  Surgeon: Jaynie Collins, DO;  Location: [facility] ENDOSCOPY;  Service: Gastroenterology;  Laterality: N/A;   ESOPHAGOGASTRODUODENOSCOPY     ESOPHAGOGASTRODUODENOSCOPY (EGD) WITH PROPOFOL N/A 08/09/2023   Procedure: ESOPHAGOGASTRODUODENOSCOPY (EGD) WITH PROPOFOL;  Surgeon: Jaynie Collins, DO;  Location: [facility] ENDOSCOPY;  Service: Gastroenterology;  Laterality: N/A;   JOINT REPLACEMENT  2022   LUMBAR LAMINECTOMY/DECOMPRESSION MICRODISCECTOMY N/A 08/15/2019   Procedure: Lumbar three to Sacral one Decompressive lumbar laminectomy;  Surgeon: [name], MD;  Location: [facility] OR;  Service: Neurosurgery;  Laterality: N/A;   neck disc surgery     plates and screws in neck x 2   POLYPECTOMY  08/09/2023   Procedure: POLYPECTOMY;  Surgeon: Jaynie Collins, DO;  Location: [facility] ENDOSCOPY;  Service: Gastroenterology;;   ROTATOR CUFF REPAIR  06/16/2016   right shoulder    ROTATOR CUFF REPAIR     SPINAL CORD STIMULATOR INSERTION N/A 03/17/2021   Procedure: THORACIC SPINAL CORD STIMULATOR (PERCUTANEOUS) & PULSE GENERATOR PLACEMENT;  Surgeon: [name], MD;  Location: [facility] ORS;  Service: Neurosurgery;  Laterality: N/A;   SPINE SURGERY  2020   TONSILLECTOMY AND ADENOIDECTOMY  1973   TOTAL HIP ARTHROPLASTY Right 04/15/2021   Procedure: TOTAL HIP ARTHROPLASTY ANTERIOR APPROACH;  Surgeon: [name], MD;  Location: [facility] ORS;  Service: Orthopedics;  Laterality: Right;    Family Psychiatric History: I have reviewed family psychiatric history from progress note on 12/26/2021.  Family History:  Family History  Problem Relation Age of Onset   Alcohol abuse Mother    Bipolar disorder Mother    Suicidality Mother    Arthritis Mother    Early death Mother    Pancreatic cancer Father    Alcohol abuse Father    Cancer Father    Drug abuse Sister    Alcohol abuse Sister    Depression Sister    Alcohol abuse Sister    Anxiety disorder Sister    Anesthesia problems Neg Hx    Breast cancer Neg Hx     Social History: I have reviewed social history from progress note on 12/26/2021. Social History   Socioeconomic History   Marital status: Married  Spouse name: jerry   Number of children: 0   Years of education: Not on file   Highest education level: Bachelor's degree (e.g., BA, AB, BS)  Occupational History   Not on file  Tobacco Use   Smoking status: Former    Current packs/day: 0.00    Average packs/day: 2.0 packs/day for 45.0 years (90.0 ttl pk-yrs)    Types: Cigarettes    Start date: 10/29/1975    Quit date: 10/28/2020    Years since quitting: 2.9   Smokeless tobacco: Never   Tobacco comments:    Quit smoking in 2022- khj 09/24/2023  Vaping Use   Vaping status: Former   Devices: vape occasionally  Substance and Sexual Activity   Alcohol use: No    Comment: recovering alcoholic Since 1994    Drug use: Never   Sexual activity: Not Currently    Birth control/protection: Abstinence  Other Topics Concern   Not on file  Social History Narrative   Married; full time; does not get regular exercise.    Social Drivers of  Health   Financial Resource Strain: Medium Risk (07/17/2023)   Overall Financial Resource Strain (CARDIA)    Difficulty of Paying Living Expenses: Somewhat hard  Food Insecurity: No Food Insecurity (07/17/2023)   Hunger Vital Sign    Worried About Running Out of Food in the Last Year: Never true    Ran Out of Food in the Last Year: Never true  Transportation Needs: No Transportation Needs (07/17/2023)   PRAPARE - Administrator, Civil Service (Medical): No    Lack of Transportation (Non-Medical): No  Physical Activity: Insufficiently Active (07/17/2023)   Exercise Vital Sign    Days of Exercise per Week: 4 days    Minutes of Exercise per Session: 30 min  Stress: No Stress Concern Present (01/31/2023)   [employer] of Occupational Health - Occupational Stress Questionnaire    Feeling of Stress : Not at all  Social Connections: Moderately Isolated (07/17/2023)   Social Connection and Isolation Panel [NHANES]    Frequency of Communication with Friends and Family: More than three times a week    Frequency of Social Gatherings with Friends and Family: More than three times a week    Attends Religious Services: Never    Database administrator or Organizations: No    Attends Banker Meetings: Never    Marital Status: Married    Allergies:  Allergies  Allergen Reactions   Sulfasalazine Other (See Comments)   Plaquenil [Hydroxychloroquine] Rash    Metabolic Disorder Labs: Lab Results  Component Value Date   HGBA1C 5.3 11/23/2022   No results found for: "PROLACTIN" Lab Results  Component Value Date   CHOL 184 07/19/2023   TRIG 156 (H) 07/19/2023   HDL 60 07/19/2023   CHOLHDL 3.1 07/19/2023   LDLCALC 97 07/19/2023   LDLCALC 78 11/23/2022   Lab Results  Component Value Date   TSH 3.673 03/10/2022   TSH 2.170 11/17/2021    Therapeutic Level Labs: No results found for: "LITHIUM" No results found for: "VALPROATE" No results found for:  "CBMZ"  Current Medications: Current Outpatient Medications  Medication Sig Dispense Refill   busPIRone (BUSPAR) 7.5 MG tablet Take 1 tablet (7.5 mg total) by mouth 2 (two) times daily. 60 tablet 1   acyclovir ointment (ZOVIRAX) 5 % :Topical Every 3 Hours as needed for lesion 30 g 1   albuterol (PROVENTIL) (2.5 MG/3ML) 0.083% nebulizer solution Take 3 mLs (  2.5 mg total) by nebulization every 4 (four) hours as needed for wheezing or shortness of breath. 225 mL 2   albuterol (VENTOLIN HFA) 108 (90 Base) MCG/ACT inhaler INHALE TWO (2) PUFFS BY MOUTH AND INTO THE LUNGS EVERY 6 HOURS AS NEEDED FOR SHORTNESS OF BREATH 8.5 g 10   Atogepant 60 MG TABS Take by mouth.     atorvastatin (LIPITOR) 40 MG tablet Take 1 tablet (40 mg total) by mouth daily. 90 tablet 3   Budeson-Glycopyrrol-Formoterol (BREZTRI AEROSPHERE) 160-9-4.8 MCG/ACT AERO Inhale 2 puffs into the lungs in the morning and at bedtime. 3 each 3   carvedilol (COREG) 12.5 MG tablet TAKE ONE TABLET BY MOUTH AT BREAKFAST AND AT BEDTIME 180 tablet 3   ezetimibe (ZETIA) 10 MG tablet Take 1 tablet (10 mg total) by mouth daily. 90 tablet 3   fexofenadine (ALLEGRA) 180 MG tablet Take 180 mg by mouth daily.     furosemide (LASIX) 20 MG tablet TAKE 1 TABLET BY MOUTH ONCE DAILY 30 tablet 2   gabapentin (NEURONTIN) 400 MG capsule Take 1 capsule (400 mg total) by mouth at bedtime. 30 capsule 5   hydrOXYzine (ATARAX) 10 MG tablet TAKE 1-2 TABLETS BY MOUTH TWICE DAILY AS NEEDED FOR SEVERE ANXIETY ATTACKS ONLY 120 tablet 10   ipratropium (ATROVENT) 0.03 % nasal spray Place 2 sprays into both nostrils every 12 (twelve) hours. 30 mL 12   leflunomide (ARAVA) 20 MG tablet Take 20 mg by mouth at bedtime.     losartan (COZAAR) 25 MG tablet TAKE ONE TABLET BY MOUTH EVERYDAY AT BEDTIME 90 tablet 1   meloxicam (MOBIC) 15 MG tablet Take 15 mg by mouth at bedtime.     mirtazapine (REMERON) 15 MG tablet TAKE 1 TABLET BY MOUTH EVERY DAY AT BEDTIME 30 tablet 10    montelukast (SINGULAIR) 10 MG tablet TAKE 1 TABLET BY MOUTH EVERY DAY AT BEDTIME 30 tablet 6   Multiple Vitamin (MULTIVITAMIN WITH MINERALS) TABS tablet Take 1 tablet by mouth daily.     Nebulizer MISC Compressor nebulizer to use with nebulized medications as instructed 1 each 0   omeprazole (PRILOSEC) 40 MG capsule TAKE ONE CAPSULE BY MOUTH AT BREAKFAST AND AT BEDTIME 180 capsule 1   oxyCODONE-acetaminophen (PERCOCET) 5-325 MG tablet Take 1 tablet by mouth every 12 (twelve) hours as needed for severe pain (pain score 7-10). Must last 30 days. 60 tablet 0   [START ON 09/30/2023] oxyCODONE-acetaminophen (PERCOCET) 5-325 MG tablet Take 1 tablet by mouth every 12 (twelve) hours as needed for severe pain (pain score 7-10). Must last 30 days. 60 tablet 0   potassium chloride SA (KLOR-CON M) 20 MEQ tablet TAKE 1 TABLET BY MOUTH EVERY MORNING 90 tablet 1   topiramate (TOPAMAX) 25 MG tablet Take 1 tablet (25 mg total) by mouth 2 (two) times daily. 60 tablet 11   valACYclovir (VALTREX) 1000 MG tablet Take 2 tablets (2,000 mg total) by mouth daily. 10 tablet 1   venlafaxine XR (EFFEXOR-XR) 75 MG 24 hr capsule Take 2 capsules (150 mg total) by mouth daily with breakfast. 60 capsule 1   XIIDRA 5 % SOLN Place 1 drop into both eyes daily.     No current facility-administered medications for this visit.     Musculoskeletal: Strength & Muscle Tone:  UTA Gait & Station:  Seated Patient leans: N/A  Psychiatric Specialty Exam: Review of Systems  Psychiatric/Behavioral: Negative.      There were no vitals taken for this visit.There is  no height or weight on file to calculate BMI.  General Appearance: Fairly Groomed  Eye Contact:  Fair  Speech:  Clear and Coherent  Volume:  Normal  Mood:  Euthymic  Affect:  Congruent  Thought Process:  Goal Directed and Descriptions of Associations: Intact  Orientation:  Full (Time, Place, and Person)  Thought Content: Logical   Suicidal Thoughts:  No  Homicidal  Thoughts:  No  Memory:  Immediate;   Fair Recent;   Fair Remote;   Fair  Judgement:  Fair  Insight:  Fair  Psychomotor Activity:  Normal  Concentration:  Concentration: Fair and Attention Span: Fair  Recall:  [employer] of Knowledge: Fair  Language: Fair  Akathisia:  No  Handed:  Right  AIMS (if indicated): not done  Assets:  Desire for Improvement Housing Social Support  ADL's:  Intact  Cognition: WNL  Sleep:  Fair   Screenings: AIMS    Flowsheet Row Video Visit from 08/13/2022 in [facility] Office Visit from 05/13/2022 in [facility] Office Visit from 03/31/2022 in [facility] Office Visit from 01/22/2022 in [facility] Office Visit from 12/26/2021 in [facility]  AIMS Total Score 0 0 0 0 0      GAD-7    Flowsheet Row Office Visit from 07/19/2023 in [facility] Office Visit from 06/21/2023 in [facility] Video Visit from 12/10/2022 in [facility] Office Visit from 05/13/2022 in [facility] Office Visit from 03/31/2022 in [facility]  Total GAD-7 Score 0 1 0 0 2      PHQ2-9    Flowsheet Row Video Visit from 09/28/2023 in [facility] Office Visit from 07/19/2023 in [facility] Office Visit from 06/21/2023 in [facility] Video Visit from 05/12/2023 in [facility] Office Visit from 02/02/2023 in [facility]  PHQ-2 Total Score 0 0 0 0 0  PHQ-9 Total Score -- 0 -- -- 1      Flowsheet Row Video Visit from 09/28/2023 in [facility] Admission (Discharged) from 08/09/2023 in [facility] Office Visit from 06/21/2023 in [facility]  C-SSRS RISK CATEGORY Moderate Risk No Risk No Risk        Assessment and Plan: Leone Mobley Tamer is a 68 year old Caucasian female, married, disabled, lives in [place], has a history of MDD, GAD, COPD, renal function impairment, was evaluated by telemedicine today.  Discussed assessment and plan as noted below.  Generalized Anxiety Disorder-stable Generalized anxiety disorder with previous panic attacks, currently well-managed on venlafaxine and mirtazapine. Psychotherapy with Gabrielle Pineda ongoing but not productive. Venlafaxine dosage adjustment needed due to decreased kidney function. Discussed alternative medications, including Buspar, previously effective without side effects. - Reduce Venlafaxine to 150 mg daily due to decreased kidney function (reviewed creatinine dated 07/19/2023-abnormal at 1.33) - Start Buspar 7.5 mg twice daily - Repeat kidney function tests as directed by primary care physician - Monitor kidney function and adjust medications as necessary - Follow up in 4 weeks to assess response to medication changes  Depression in remission Depression managed with venlafaxine and mirtazapine. No significant depressive symptoms or suicidal ideation in the  past two weeks.  - Continue current medications with adjusted Venlafaxine dosage - Monitor for any changes in mood or suicidal ideation - Continue CBT  Primary insomnia-able Chronic insomnia managed with mirtazapine. No new complaints or changes in sleep patterns reported. - Continue Mirtazapine 15 mg at bedtime  Alcohol use disorder in remission Sober since 1994.  Follow-up - Follow up in 4 weeks on March 4th at 3:20 PM at the new office location  Collaboration of Care: Collaboration of Care: Other encouraged to  continue to follow up with primary care provider for monitoring renal function, encouraged to follow up with psychotherapist to continue CBT  Patient/Guardian was advised Release of Information must be obtained prior to any record release in order to collaborate their care with an outside provider. Patient/Guardian was advised if they have not already done so to contact the registration department to sign all necessary forms in order for [place] to release information regarding their care.   Consent: Patient/Guardian gives verbal consent for treatment and assignment of benefits for services provided during this visit. Patient/Guardian expressed understanding and agreed to proceed.   This note was generated in part or whole with voice recognition software. Voice recognition is usually quite accurate but there are transcription errors that can and very often do occur. I apologize for any typographical errors that were not detected and corrected.    Jomarie Longs, MD 09/29/2023, 2:17 PM

## 2023-09-28 NOTE — Patient Instructions (Signed)
Buspirone Tablets What is this medication? BUSPIRONE (byoo SPYE rone) treats anxiety. It works by balancing the levels of dopamine and serotonin in your brain, substances that help regulate mood. This medicine may be used for other purposes; ask your health care provider or pharmacist if you have questions. COMMON BRAND NAME(S): BuSpar, Buspar Dividose What should I tell my care team before I take this medication? They need to know if you have any of these conditions: Kidney or liver disease An unusual or allergic reaction to buspirone, other medications, foods, dyes, or preservatives Pregnant or trying to get pregnant Breast-feeding How should I use this medication? Take this medication by mouth with a glass of water. Follow the directions on the prescription label. You may take this medication with or without food. To ensure that this medication always works the same way for you, you should take it either always with or always without food. Take your doses at regular intervals. Do not take your medication more often than directed. Do not stop taking except on the advice of your care team. Talk to your care team about the use of this medication in children. Special care may be needed. Overdosage: If you think you have taken too much of this medicine contact a poison control center or emergency room at once. NOTE: This medicine is only for you. Do not share this medicine with others. What if I miss a dose? If you miss a dose, take it as soon as you can. If it is almost time for your next dose, take only that dose. Do not take double or extra doses. What may interact with this medication? Do not take this medication with any of the following: Linezolid MAOIs like Carbex, Eldepryl, Marplan, Nardil, and Parnate Methylene blue Procarbazine This medication may also interact with the following: Diazepam Digoxin Diltiazem Erythromycin Grapefruit juice Haloperidol Medications for mental  depression or mood problems Medications for seizures like carbamazepine, phenobarbital and phenytoin Nefazodone Other medications for anxiety Rifampin Ritonavir Some antifungal medications like itraconazole, ketoconazole, and voriconazole Verapamil Warfarin This list may not describe all possible interactions. Give your health care provider a list of all the medicines, herbs, non-prescription drugs, or dietary supplements you use. Also tell them if you smoke, drink alcohol, or use illegal drugs. Some items may interact with your medicine. What should I watch for while using this medication? Visit your care team for regular checks on your progress. It may take 1 to 2 weeks before your anxiety gets better. This medication may affect your coordination, reaction time, or judgment. Do not drive or operate machinery until you know how this medication affects you. Sit up or stand slowly to reduce the risk of dizzy or fainting spells. Drinking alcohol with this medication can increase the risk of these side effects. What side effects may I notice from receiving this medication? Side effects that you should report to your care team as soon as possible: Allergic reactions--skin rash, itching, hives, swelling of the face, lips, tongue, or throat Irritability, confusion, fast or irregular heartbeat, muscle stiffness, twitching muscles, sweating, high fever, seizure, chills, vomiting, diarrhea, which may be signs of serotonin syndrome Side effects that usually do not require medical attention (report to your care team if they continue or are bothersome): Anxiety, nervousness Dizziness Drowsiness Headache Nausea Trouble sleeping This list may not describe all possible side effects. Call your doctor for medical advice about side effects. You may report side effects to FDA at 1-800-FDA-1088. Where should I keep  my medication? Keep out of the reach of children. Store at room temperature below 30 degrees C  (86 degrees F). Protect from light. Keep container tightly closed. Throw away any unused medication after the expiration date. NOTE: This sheet is a summary. It may not cover all possible information. If you have questions about this medicine, talk to your doctor, pharmacist, or health care provider.  2024 Elsevier/Gold Standard (2022-02-23 00:00:00)

## 2023-09-29 ENCOUNTER — Other Ambulatory Visit: Payer: Medicare Other | Admitting: Pharmacist

## 2023-10-05 ENCOUNTER — Encounter: Payer: Self-pay | Admitting: Family Medicine

## 2023-10-06 ENCOUNTER — Ambulatory Visit: Payer: Self-pay | Admitting: Family Medicine

## 2023-10-06 ENCOUNTER — Telehealth: Payer: Medicare Other | Admitting: Family Medicine

## 2023-10-06 VITALS — Ht 69.0 in | Wt 170.8 lb

## 2023-10-06 DIAGNOSIS — J309 Allergic rhinitis, unspecified: Secondary | ICD-10-CM

## 2023-10-06 MED ORDER — IPRATROPIUM BROMIDE 0.03 % NA SOLN: 2.0000 | Freq: Three times a day (TID) | NASAL | 1 refills | Status: DC

## 2023-10-06 MED ORDER — AZELASTINE HCL 0.1 % NA SOLN: 2.0000 | Freq: Two times a day (BID) | NASAL | 1 refills | Status: DC

## 2023-10-06 MED ORDER — FLUTICASONE PROPIONATE 50 MCG/ACT NA SUSP: 2.0000 | Freq: Two times a day (BID) | NASAL | 1 refills | Status: DC

## 2023-10-06 NOTE — Progress Notes (Signed)
 Virtual Visit via Video note  I connected with Gabrielle Pineda on 10/06/23 at 1309 by video and verified that I am speaking with the correct person using two identifiers. [name] is currently located at sisters home and  is currently alone with her during visit. The provider, Dana Allan, MD is located in their home at time of visit.  I discussed the limitations, risks, security and privacy concerns of performing an evaluation and management service by video and the availability of in person appointments. I also discussed with the patient that there may be a patient responsible charge related to this service. The patient expressed understanding and agreed to proceed.  Subjective: PCP: Sherlyn Hay, DO  Chief Complaint  Patient presents with   URI    Runny nose x 2 months, cough, congestion, wheezing in the morning x 1-2 weeks. Denies Chest pain or SOB. No recent Abx or steroids. No at home testing done.    URI   Discussed the use of AI scribe software for clinical note transcription with the patient, who gave verbal consent to proceed.   Discussed the use of AI scribe software for clinical note transcription with the patient, who gave verbal consent to proceed.  History of Present Illness Gabrielle Pineda is a 68 year old female with allergies who presents with a chronic runny nose.  She has been experiencing a chronic runny nose for several months. Despite trying various over-the-counter medications, she has not found relief. She uses ipratropium nasal spray once daily, which initially provided some benefit but is now ineffective. No sinus pressure is present, and she has not sought consultation with an ENT specialist for this issue.  She has a history of allergies and experiences fever blisters, which appeared on her upper lip two days ago and on her lower lip last night. She uses a cream for the blisters, which she finds helpful. The blisters can occur due to stress  and not necessarily with fever. She takes Valtrex for fever blisters, two grams in the morning and two grams at night for one day, but she started this regimen three days after the onset of the blisters.  She reports headaches over the past four to five days, which are alleviated by Advil. She uses Breztri inhaler two puffs twice daily and takes Singulair every night. She is unsure if she is taking Allegra, which is also for allergies. She denies any cough, shortness of breath, chest pain or wheezing. No recent sick contacts.  She has a history of rheumatoid arthritis and takes leflunomide and meloxicam for this condition. She also takes Atogepant 60 mg for migraines.  She quit smoking three years ago and does not consume alcohol or use illicit drugs.    ROS: Per HPI  Current Outpatient Medications:    acyclovir ointment (ZOVIRAX) 5 %, :Topical Every 3 Hours as needed for lesion, Disp: 30 g, Rfl: 1   albuterol (PROVENTIL) (2.5 MG/3ML) 0.083% nebulizer solution, Take 3 mLs (2.5 mg total) by nebulization every 4 (four) hours as needed for wheezing or shortness of breath., Disp: 225 mL, Rfl: 2   albuterol (VENTOLIN HFA) 108 (90 Base) MCG/ACT inhaler, INHALE TWO (2) PUFFS BY MOUTH AND INTO THE LUNGS EVERY 6 HOURS AS NEEDED FOR SHORTNESS OF BREATH, Disp: 8.5 g, Rfl: 10   Atogepant 60 MG TABS, Take by mouth., Disp: , Rfl:    atorvastatin (LIPITOR) 40 MG tablet, Take 1 tablet (40 mg total) by mouth daily.,  Disp: 90 tablet, Rfl: 3   azelastine (ASTELIN) 0.1 % nasal spray, Place 2 sprays into both nostrils 2 (two) times daily. Use in each nostril as directed, Disp: 30 mL, Rfl: 1   Budeson-Glycopyrrol-Formoterol (BREZTRI AEROSPHERE) 160-9-4.8 MCG/ACT AERO, Inhale 2 puffs into the lungs in the morning and at bedtime., Disp: 3 each, Rfl: 3   busPIRone (BUSPAR) 7.5 MG tablet, Take 1 tablet (7.5 mg total) by mouth 2 (two) times daily., Disp: 60 tablet, Rfl: 1   carvedilol (COREG) 12.5 MG tablet, TAKE ONE  TABLET BY MOUTH AT BREAKFAST AND AT BEDTIME, Disp: 180 tablet, Rfl: 3   ezetimibe (ZETIA) 10 MG tablet, Take 1 tablet (10 mg total) by mouth daily., Disp: 90 tablet, Rfl: 3   fexofenadine (ALLEGRA) 180 MG tablet, Take 180 mg by mouth daily., Disp: , Rfl:    fluticasone (FLONASE) 50 MCG/ACT nasal spray, Place 2 sprays into both nostrils in the morning and at bedtime., Disp: 16 g, Rfl: 1   gabapentin (NEURONTIN) 400 MG capsule, Take 1 capsule (400 mg total) by mouth at bedtime., Disp: 30 capsule, Rfl: 5   hydrOXYzine (ATARAX) 10 MG tablet, TAKE 1-2 TABLETS BY MOUTH TWICE DAILY AS NEEDED FOR SEVERE ANXIETY ATTACKS ONLY, Disp: 120 tablet, Rfl: 10   leflunomide (ARAVA) 20 MG tablet, Take 20 mg by mouth at bedtime., Disp: , Rfl:    losartan (COZAAR) 25 MG tablet, TAKE ONE TABLET BY MOUTH EVERYDAY AT BEDTIME, Disp: 90 tablet, Rfl: 1   meloxicam (MOBIC) 15 MG tablet, Take 15 mg by mouth at bedtime., Disp: , Rfl:    mirtazapine (REMERON) 15 MG tablet, TAKE 1 TABLET BY MOUTH EVERY DAY AT BEDTIME, Disp: 30 tablet, Rfl: 10   montelukast (SINGULAIR) 10 MG tablet, TAKE 1 TABLET BY MOUTH EVERY DAY AT BEDTIME, Disp: 30 tablet, Rfl: 6   Multiple Vitamin (MULTIVITAMIN WITH MINERALS) TABS tablet, Take 1 tablet by mouth daily., Disp: , Rfl:    Nebulizer MISC, Compressor nebulizer to use with nebulized medications as instructed, Disp: 1 each, Rfl: 0   omeprazole (PRILOSEC) 40 MG capsule, TAKE ONE CAPSULE BY MOUTH AT BREAKFAST AND AT BEDTIME, Disp: 180 capsule, Rfl: 1   oxyCODONE-acetaminophen (PERCOCET) 5-325 MG tablet, Take 1 tablet by mouth every 12 (twelve) hours as needed for severe pain (pain score 7-10). Must last 30 days., Disp: 60 tablet, Rfl: 0   potassium chloride SA (KLOR-CON M) 20 MEQ tablet, TAKE 1 TABLET BY MOUTH EVERY MORNING, Disp: 90 tablet, Rfl: 1   topiramate (TOPAMAX) 25 MG tablet, Take 1 tablet (25 mg total) by mouth 2 (two) times daily., Disp: 60 tablet, Rfl: 11   valACYclovir (VALTREX) 1000 MG  tablet, Take 2 tablets (2,000 mg total) by mouth daily., Disp: 10 tablet, Rfl: 1   venlafaxine XR (EFFEXOR-XR) 75 MG 24 hr capsule, Take 2 capsules (150 mg total) by mouth daily with breakfast., Disp: 60 capsule, Rfl: 1   XIIDRA 5 % SOLN, Place 1 drop into both eyes daily., Disp: , Rfl:    furosemide (LASIX) 20 MG tablet, TAKE 1 TABLET BY MOUTH ONCE DAILY, Disp: 30 tablet, Rfl: 2   ipratropium (ATROVENT) 0.03 % nasal spray, Place 2 sprays into both nostrils 3 (three) times daily., Disp: 30 mL, Rfl: 1  Observations/Objective:  Today's Vitals   10/06/23 1306  Weight: 170 lb 12.8 oz (77.5 kg)  Height: 5\' 9"  (1.753 m)   Body mass index is 25.22 kg/m.   Physical Exam Pulmonary:  Effort: Pulmonary effort is normal.  Neurological:     Mental Status: She is alert and oriented to person, place, and time. Mental status is at baseline.  Psychiatric:        Mood and Affect: Mood normal.        Behavior: Behavior normal.        Thought Content: Thought content normal.        Judgment: Judgment normal.    Assessment and Plan: Allergic rhinitis, unspecified seasonality, unspecified trigger Assessment & Plan: Persistent runny nose despite use of over-the-counter medications and ipratropium nasal spray. No sinus pressure or other symptoms suggestive of sinusitis. -Start Astelin nasal spray, two sprays in each nostril twice daily. -Continue Flonase, two sprays in each nostril twice daily. -Continue ipratropium nasal spray, up to three times daily as needed for additional symptom control. -Continue Allegra daily as prescribed  -Continue Montelukast at night as prescribed -Follow up with PCP  Orders: -     Fluticasone Propionate; Place 2 sprays into both nostrils in the morning and at bedtime.  Dispense: 16 g; Refill: 1 -     Azelastine HCl; Place 2 sprays into both nostrils 2 (two) times daily. Use in each nostril as directed  Dispense: 30 mL; Refill: 1 -     Ipratropium Bromide; Place 2  sprays into both nostrils 3 (three) times daily.  Dispense: 30 mL; Refill: 1     Follow Up Instructions: Return if symptoms worsen or fail to improve, for PCP.   I discussed the assessment and treatment plan with the patient. The patient was provided an opportunity to ask questions and all were answered. The patient agreed with the plan and demonstrated an understanding of the instructions.   The patient was advised to call back or seek an in-person evaluation if the symptoms worsen or if the condition fails to improve as anticipated.  The above assessment and management plan was discussed with the patient. The patient verbalized understanding of and has agreed to the management plan. Patient is aware to call the clinic if symptoms persist or worsen. Patient is aware when to return to the clinic for a follow-up visit. Patient educated on when it is appropriate to go to the emergency department.    Dana Allan, MD

## 2023-10-06 NOTE — Assessment & Plan Note (Addendum)
Persistent runny nose despite use of over-the-counter medications and ipratropium nasal spray. No sinus pressure or other symptoms suggestive of sinusitis. -Start Astelin nasal spray, two sprays in each nostril twice daily. -Continue Flonase, two sprays in each nostril twice daily. -Continue ipratropium nasal spray, up to three times daily as needed for additional symptom control. -Continue Allegra daily as prescribed  -Continue Montelukast at night as prescribed -Follow up with PCP

## 2023-10-06 NOTE — Patient Instructions (Addendum)
It was a pleasure meeting you today. Thank you for allowing me to take part in your health care.  Our goals for today as we discussed include:  Start Flonase 2 sprays two times a day Start Astelin 2 sprays 2 times a day Take both sprays together  If continuing to have runny nose can use Ipratropium nasal spray 2 sprays up to three times a day  Continue Singulair at night Continue Allegra daily as previously ordered  If no improvement in symptoms follow up with PCP or if any fevers, shortness of breath notify MD   This is a list of the screening recommended for you and due dates:  Health Maintenance  Topic Date Due   Zoster (Shingles) Vaccine (1 of 2) Never done   Screening for Lung Cancer  06/30/2023   Medicare Annual Wellness Visit  11/09/2023   COVID-19 Vaccine (3 - Pfizer risk series) 04/17/2024*   Mammogram  02/03/2024   Colon Cancer Screening  08/08/2024   DTaP/Tdap/Td vaccine (3 - Td or Tdap) 11/14/2025   Pneumonia Vaccine  Completed   Flu Shot  Completed   DEXA scan (bone density measurement)  Completed   Hepatitis C Screening  Completed   HPV Vaccine  Aged Out  *Topic was postponed. The date shown is not the original due date.      If you have any questions or concerns, please do not hesitate to call the office at [phone].  I look forward to our next visit and until then take care and stay safe.  Regards,   Dana Allan, MD   [facility]

## 2023-10-06 NOTE — Telephone Encounter (Signed)
  Chief Complaint: runny nose Symptoms: runny nose, raw/sore nose, productive cough, chest congestion, occasional wheezing Frequency: runny nose x 2 months, cough/congestion/wheezing x 1-2 weeks Pertinent Negatives: Patient denies itchy/red/watery eyes, sore throat, chest pain Disposition: [] ED /[] Urgent Care (no appt availability in office) / [x] Appointment(In office/virtual)/ []  [facility]/ [] Home Care/ [] Refused Recommended Disposition /[] [facility]/ []  Follow-up with PCP Additional Notes: Patient speaking in full sentences, no audible cough or wheezing noted. Patient denies SOB while speaking. Patient states she has a history of COPD. Patient states she has tried multiple OTC medications and nothing has cleared up her runny nose and states it is now cough and congestion. Patient states she does not feel comfortable with office visit today or tomorrow due to weather. Patient agreeable to virtual visit today.   Copied from CRM [id number]. Topic: Clinical - Medical Advice >> Oct 06, 2023 11:43 AM Epimenio Foot F wrote: Reason for CRM: Patient is calling in because she would like advice on what to do to stop her nose from running. She saws she's tried some things over the counter, but it hasn't been helping. Patient says her nose is becoming raw from wiping it so much and she would like to know if there is something she can do or something she can take to help. Reason for Disposition  Lots of coughing  Answer Assessment - Initial Assessment Questions 1. SYMPTOM: "What's the main symptom you're concerned about?" (e.g., runny nose, stuffiness, sneezing, itching)     Runny nose.  2. SEVERITY: "How bad is it?" "What does it keep you from doing?" (e.g., sleeping, working)      Non stop, constant dripped. She states her nose is raw and she has sores from it running so much.  3. EYES: "Are the eyes also red, watery, and itchy?"      Denies.  4. TRIGGER: "What pollen or other allergic  substance do you think is causing the symptoms?"      Denies. She states she knows she has allergies but unsure what caused this.  5. TREATMENT: "What medicine are you using?" "What medicine worked best in the past?"     Muccinex, Catering manager plus (congestion), Singulair, zyrtec  6. OTHER SYMPTOMS: "Do you have any other symptoms?" (e.g., coughing, difficulty breathing, wheezing)     Cough with congestion, states she has COPD and gets winded easy, wheezing ("occasionally, especially in the morning")  Protocols used: Nasal Allergies (Hay Fever)-A-AH

## 2023-10-08 ENCOUNTER — Ambulatory Visit: Payer: Medicare Other | Admitting: Podiatry

## 2023-10-15 ENCOUNTER — Other Ambulatory Visit: Payer: Self-pay | Admitting: Psychiatry

## 2023-10-15 DIAGNOSIS — F411 Generalized anxiety disorder: Secondary | ICD-10-CM

## 2023-10-17 NOTE — Progress Notes (Unsigned)
 [facility] MD OP Progress Note  10/19/2023 3:38 PM Gabrielle Pineda  MRN:  [id number]  Chief Complaint:  Chief Complaint  Patient presents with   Follow-up   Insomnia   Depression   Anxiety   Medication Refill   HPI: Gabrielle Pineda is a 68 year old Caucasian female on disability, lives in [place], has a history of MDD, GAD, primary insomnia, alcohol use disorder in remission, history of back pain, history of spinal cord stimulator, paroxysmal ventricular tachycardia, hypercholesterolemia, chronic pain syndrome, GERD was evaluated in office today.  She has been experiencing daily headaches since starting Buspar, which she suspects may be related to the medication. The headaches are described as severe and differ from her usual migraines, which she has not been experiencing in the same way. She is currently taking Topamax for migraine management.  Her anxiety and depression have been stable, with no current anxiety, depression, suicidal thoughts, thoughts of harming others, or panic attacks. She is currently taking mirtazapine 15 mg for sleep and gabapentin as prescribed by pain management. She has discontinued therapy as it was deemed unnecessary by her therapist.  She reports sleeping well and continues to take mirtazapine 15 mg, which may aid in sleep.  She has a history of alcohol use, which has been in remission for a long time. There is no mention of any current alcohol use or related issues.   Visit Diagnosis:    ICD-10-CM   1. Primary insomnia  F51.01     2. MDD (major depressive disorder), recurrent, in full remission (HCC)  F33.42     3. GAD (generalized anxiety disorder)  F41.1 busPIRone (BUSPAR) 7.5 MG tablet    4. Alcohol use disorder, moderate, in sustained remission (HCC)  F10.21       Past Psychiatric History: I have reviewed past psychiatric history from progress note on 12/26/2021.  Trials of medications like sertraline, multiple other medications-does not remember all  the names.  One suicide attempt when she was a teenager-overdosed on aspirin.    Past Medical History:  Past Medical History:  Diagnosis Date   Alcohol abuse    Allergy 1983   Anemia    Anxiety    Arthritis    Cataract    Cervicalgia    Cirrhosis (HCC) 1994   COPD (chronic obstructive pulmonary disease) (HCC)    Depression    Difficult intubation    has plates and screws in neck   Dyspnea    GERD (gastroesophageal reflux disease)    Headache    Heart murmur    on heard when pt is lying   Hypertension    Neuromuscular disorder (HCC)    Neuropathy   Other and unspecified hyperlipidemia    Oxygen deficiency    Status post insertion of spinal cord stimulator    Tachycardia    d/t questionable anxiety happens every 5- 10 years, sees [facility]    Past Surgical History:  Procedure Laterality Date   ANTERIOR CERVICAL DECOMP/DISCECTOMY FUSION  2012, 2015, 2018   x3   AUGMENTATION MAMMAPLASTY Bilateral 1982   BREAST ENHANCEMENT SURGERY  1981   CARPAL TUNNEL RELEASE  11/11/2011   Procedure: CARPAL TUNNEL RELEASE;  Surgeon: [name], MD;  Location: [facility]URO ORS;  Service: Neurosurgery;  Laterality: Right;  Right Median Nerve Decompression   CARPAL TUNNEL RELEASE  2013   COLONOSCOPY WITH PROPOFOL N/A 11/01/2017   Procedure: COLONOSCOPY WITH PROPOFOL;  Surgeon: [name], MD;  Location: [facility] ENDOSCOPY;  Service: Endoscopy;  Laterality: N/A;   COLONOSCOPY WITH PROPOFOL N/A 08/09/2023   Procedure: COLONOSCOPY WITH PROPOFOL;  Surgeon: Jaynie Collins, DO;  Location: [facility] ENDOSCOPY;  Service: Gastroenterology;  Laterality: N/A;   ESOPHAGOGASTRODUODENOSCOPY     ESOPHAGOGASTRODUODENOSCOPY (EGD) WITH PROPOFOL N/A 08/09/2023   Procedure: ESOPHAGOGASTRODUODENOSCOPY (EGD) WITH PROPOFOL;  Surgeon: Jaynie Collins, DO;  Location: [facility] ENDOSCOPY;  Service: Gastroenterology;  Laterality: N/A;   JOINT REPLACEMENT  2022   LUMBAR LAMINECTOMY/DECOMPRESSION  MICRODISCECTOMY N/A 08/15/2019   Procedure: Lumbar three to Sacral one Decompressive lumbar laminectomy;  Surgeon: [name], MD;  Location: [facility] OR;  Service: Neurosurgery;  Laterality: N/A;   neck disc surgery     plates and screws in neck x 2   POLYPECTOMY  08/09/2023   Procedure: POLYPECTOMY;  Surgeon: Jaynie Collins, DO;  Location: [facility] ENDOSCOPY;  Service: Gastroenterology;;   ROTATOR CUFF REPAIR  06/16/2016   right shoulder    ROTATOR CUFF REPAIR     SPINAL CORD STIMULATOR INSERTION N/A 03/17/2021   Procedure: THORACIC SPINAL CORD STIMULATOR (PERCUTANEOUS) & PULSE GENERATOR PLACEMENT;  Surgeon: [name], MD;  Location: [facility] ORS;  Service: Neurosurgery;  Laterality: N/A;   SPINE SURGERY  2020   TONSILLECTOMY AND ADENOIDECTOMY  1973   TOTAL HIP ARTHROPLASTY Right 04/15/2021   Procedure: TOTAL HIP ARTHROPLASTY ANTERIOR APPROACH;  Surgeon: [name], MD;  Location: [facility] ORS;  Service: Orthopedics;  Laterality: Right;    Family Psychiatric History: I have reviewed family psychiatric history from progress note on 12/26/2021.  Family History:  Family History  Problem Relation Age of Onset   Alcohol abuse Mother    Bipolar disorder Mother    Suicidality Mother    Arthritis Mother    Early death Mother    Pancreatic cancer Father    Alcohol abuse Father    Cancer Father    Drug abuse Sister    Alcohol abuse Sister    Depression Sister    Alcohol abuse Sister    Anxiety disorder Sister    Anesthesia problems Neg Hx    Breast cancer Neg Hx     Social History: I have reviewed social history from progress note on 12/26/2021. Social History   Socioeconomic History   Marital status: Married    Spouse name: jerry   Number of children: 0   Years of education: Not on file   Highest education level: Bachelor's degree (e.g., BA, AB, BS)  Occupational History   Not on file  Tobacco Use   Smoking status: Former    Current packs/day: 0.00    Average packs/day: 2.0  packs/day for 45.0 years (90.0 ttl pk-yrs)    Types: Cigarettes    Start date: 10/29/1975    Quit date: 10/28/2020    Years since quitting: 2.9   Smokeless tobacco: Never   Tobacco comments:    Quit smoking in 2022- khj 09/24/2023  Vaping Use   Vaping status: Former   Devices: vape occasionally  Substance and Sexual Activity   Alcohol use: No    Comment: recovering alcoholic Since 1994    Drug use: Never   Sexual activity: Not Currently    Birth control/protection: Abstinence  Other Topics Concern   Not on file  Social History Narrative   Married; full time; does not get regular exercise.    Social Drivers of Health   Financial Resource Strain: Low Risk  (10/06/2023)   Overall Financial Resource Strain (CARDIA)    Difficulty of Paying Living Expenses:  Not hard at all  Recent Concern: Financial Resource Strain - Medium Risk (07/17/2023)   Overall Financial Resource Strain (CARDIA)    Difficulty of Paying Living Expenses: Somewhat hard  Food Insecurity: No Food Insecurity (10/06/2023)   Hunger Vital Sign    Worried About Running Out of Food in the Last Year: Never true    Ran Out of Food in the Last Year: Never true  Transportation Needs: No Transportation Needs (10/06/2023)   PRAPARE - Administrator, Civil Service (Medical): No    Lack of Transportation (Non-Medical): No  Physical Activity: Inactive (10/06/2023)   Exercise Vital Sign    Days of Exercise per Week: 0 days    Minutes of Exercise per Session: 30 min  Stress: No Stress Concern Present (10/06/2023)   [employer] of Occupational Health - Occupational Stress Questionnaire    Feeling of Stress : Not at all  Social Connections: Moderately Isolated (10/06/2023)   Social Connection and Isolation Panel [NHANES]    Frequency of Communication with Friends and Family: More than three times a week    Frequency of Social Gatherings with Friends and Family: More than three times a week    Attends Religious  Services: Never    Database administrator or Organizations: No    Attends Banker Meetings: Never    Marital Status: Married    Allergies:  Allergies  Allergen Reactions   Sulfasalazine Other (See Comments)   Plaquenil [Hydroxychloroquine] Rash    Metabolic Disorder Labs: Lab Results  Component Value Date   HGBA1C 5.3 11/23/2022   No results found for: "PROLACTIN" Lab Results  Component Value Date   CHOL 184 07/19/2023   TRIG 156 (H) 07/19/2023   HDL 60 07/19/2023   CHOLHDL 3.1 07/19/2023   LDLCALC 97 07/19/2023   LDLCALC 78 11/23/2022   Lab Results  Component Value Date   TSH 3.673 03/10/2022   TSH 2.170 11/17/2021    Therapeutic Level Labs: No results found for: "LITHIUM" No results found for: "VALPROATE" No results found for: "CBMZ"  Current Medications: Current Outpatient Medications  Medication Sig Dispense Refill   acyclovir ointment (ZOVIRAX) 5 % :Topical Every 3 Hours as needed for lesion 30 g 1   albuterol (VENTOLIN HFA) 108 (90 Base) MCG/ACT inhaler INHALE TWO (2) PUFFS BY MOUTH AND INTO THE LUNGS EVERY 6 HOURS AS NEEDED FOR SHORTNESS OF BREATH 8.5 g 10   Atogepant 60 MG TABS Take by mouth.     atorvastatin (LIPITOR) 40 MG tablet Take 1 tablet (40 mg total) by mouth daily. 90 tablet 3   azelastine (ASTELIN) 0.1 % nasal spray Place 2 sprays into both nostrils 2 (two) times daily. Use in each nostril as directed 30 mL 1   Budeson-Glycopyrrol-Formoterol (BREZTRI AEROSPHERE) 160-9-4.8 MCG/ACT AERO Inhale 2 puffs into the lungs in the morning and at bedtime. 3 each 3   carvedilol (COREG) 12.5 MG tablet TAKE ONE TABLET BY MOUTH AT BREAKFAST AND AT BEDTIME 180 tablet 3   ezetimibe (ZETIA) 10 MG tablet Take 1 tablet (10 mg total) by mouth daily. 90 tablet 3   fexofenadine (ALLEGRA) 180 MG tablet Take 180 mg by mouth daily.     fluticasone (FLONASE) 50 MCG/ACT nasal spray Place 2 sprays into both nostrils in the morning and at bedtime. 16 g 1    furosemide (LASIX) 20 MG tablet TAKE 1 TABLET BY MOUTH ONCE DAILY 30 tablet 2   gabapentin (NEURONTIN) 400 MG capsule  Take 1 capsule (400 mg total) by mouth at bedtime. 30 capsule 5   hydrOXYzine (ATARAX) 10 MG tablet TAKE 1-2 TABLETS BY MOUTH TWICE DAILY AS NEEDED FOR SEVERE ANXIETY ATTACKS ONLY 120 tablet 10   ipratropium (ATROVENT) 0.03 % nasal spray Place 2 sprays into both nostrils 3 (three) times daily. 30 mL 1   leflunomide (ARAVA) 20 MG tablet Take 20 mg by mouth at bedtime.     losartan (COZAAR) 25 MG tablet TAKE ONE TABLET BY MOUTH EVERYDAY AT BEDTIME 90 tablet 1   meloxicam (MOBIC) 15 MG tablet Take 15 mg by mouth at bedtime.     mirtazapine (REMERON) 15 MG tablet TAKE 1 TABLET BY MOUTH EVERY DAY AT BEDTIME 30 tablet 10   montelukast (SINGULAIR) 10 MG tablet TAKE 1 TABLET BY MOUTH EVERY DAY AT BEDTIME 30 tablet 6   Multiple Vitamin (MULTIVITAMIN WITH MINERALS) TABS tablet Take 1 tablet by mouth daily.     Nebulizer MISC Compressor nebulizer to use with nebulized medications as instructed 1 each 0   omeprazole (PRILOSEC) 40 MG capsule TAKE ONE CAPSULE BY MOUTH AT BREAKFAST AND AT BEDTIME 180 capsule 1   oxyCODONE-acetaminophen (PERCOCET) 5-325 MG tablet Take 1 tablet by mouth every 12 (twelve) hours as needed for severe pain (pain score 7-10). Must last 30 days. 60 tablet 0   potassium chloride SA (KLOR-CON M) 20 MEQ tablet TAKE 1 TABLET BY MOUTH EVERY MORNING 90 tablet 1   topiramate (TOPAMAX) 25 MG tablet Take 1 tablet (25 mg total) by mouth 2 (two) times daily. 60 tablet 11   valACYclovir (VALTREX) 1000 MG tablet Take 2 tablets (2,000 mg total) by mouth daily. 10 tablet 1   venlafaxine XR (EFFEXOR-XR) 75 MG 24 hr capsule Take 2 capsules (150 mg total) by mouth daily with breakfast. 60 capsule 1   XIIDRA 5 % SOLN Place 1 drop into both eyes daily.     albuterol (PROVENTIL) (2.5 MG/3ML) 0.083% nebulizer solution Take 3 mLs (2.5 mg total) by nebulization every 4 (four) hours as needed for  wheezing or shortness of breath. 225 mL 2   busPIRone (BUSPAR) 7.5 MG tablet Take 1 tablet (7.5 mg total) by mouth 2 (two) times daily. Hold for now     No current facility-administered medications for this visit.     Musculoskeletal: Strength & Muscle Tone: within normal limits Gait & Station: normal Patient leans: N/A  Psychiatric Specialty Exam: Review of Systems  Neurological:  Positive for headaches.  Psychiatric/Behavioral: Negative.      Blood pressure 138/88, pulse (!) 106, temperature 97.8 F (36.6 C), temperature source Temporal, height 5\' 9"  (1.753 m), weight 170 lb 6.4 oz (77.3 kg), SpO2 100%.Body mass index is 25.16 kg/m.  General Appearance: Casual  Eye Contact:  Fair  Speech:  Clear and Coherent  Volume:  Normal  Mood:  Euthymic  Affect:  Congruent  Thought Process:  Goal Directed and Descriptions of Associations: Intact  Orientation:  Full (Time, Place, and Person)  Thought Content: Logical   Suicidal Thoughts:  No  Homicidal Thoughts:  No  Memory:  Immediate;   Fair Recent;   Fair Remote;   Fair  Judgement:  Fair  Insight:  Fair  Psychomotor Activity:  Normal  Concentration:  Concentration: Fair and Attention Span: Fair  Recall:  [employer] of Knowledge: Fair  Language: Fair  Akathisia:  No  Handed:  Right  AIMS (if indicated): not done  Assets:  Desire for Improvement Housing  Social Support Transportation  ADL's:  Intact  Cognition: WNL  Sleep:  Fair   Screenings: AIMS    Flowsheet Row Video Visit from 08/13/2022 in [facility] Office Visit from 05/13/2022 in [facility] Office Visit from 03/31/2022 in [facility] Office Visit from 01/22/2022 in [facility] Office Visit from 12/26/2021 in [facility]  AIMS Total Score 0 0 0 0 0      GAD-7     Flowsheet Row Office Visit from 10/19/2023 in [facility] Office Visit from 07/19/2023 in [facility] Office Visit from 06/21/2023 in [facility] Video Visit from 12/10/2022 in [facility] Office Visit from 05/13/2022 in [facility]  Total GAD-7 Score 0 0 1 0 0      PHQ2-9    Flowsheet Row Office Visit from 10/19/2023 in [facility] Video Visit from 09/28/2023 in [facility] Office Visit from 07/19/2023 in [facility] Office Visit from 06/21/2023 in [facility] Video Visit from 05/12/2023 in [facility]  PHQ-2 Total Score 0 0 0 0 0  PHQ-9 Total Score -- -- 0 -- --      Flowsheet Row Office Visit from 10/19/2023 in [facility] Video Visit from 09/28/2023 in [facility] Admission (Discharged) from 08/09/2023 in [facility]  C-SSRS RISK CATEGORY Moderate Risk Moderate Risk No Risk        Assessment and Plan: Gabrielle Pineda is a 68 year old Caucasian female who has a history of depression, anxiety, insomnia was evaluated for a follow-up appointment.  Discussed assessment and plan as noted below.  Generalized Anxiety Disorder-stable No current anxiety or panic attacks. Venlafaxine dosage adjusted due to renal function concerns to prevent accumulation and side effects. - Continue venlafaxine 150 mg daily-reduced dosage for now. - Will repeat renal function in the future and consider further dosage reduction as needed. - Patient advised to hold BuSpar for now since she is having headaches. - Patient to message this  provider in 10 days from now with an update.  Major Depressive Disorder in remission No current depressive symptoms, well-managed on current medication regimen - Continue Venlafaxine 150 mg daily - Continue Mirtazapine 15 mg at bedtime  Primary Insomnia-stable Reports good sleep, continues mirtazapine which may aid sleep. - Continue sleep hygiene techniques - Continue Mirtazapine 15 mg at bedtime  Alcohol use disorder in remission Patient continues to be sober since 1994. - We will reevaluate in future sessions.   Consider repeat renal function check in the future if not done by primary care provider.  Follow-up Follow-up in 6-7 weeks via video consultation. Message in 10 days regarding headache status post-Buspar hold. - Schedule follow-up appointment in 6-7 weeks via video - Send message in 10 days regarding headache status after Buspar hold   Collaboration of Care: Collaboration of Care: Primary Care Provider AEB patient encouraged to continue follow-up with primary care provider.  Patient/Guardian was advised Release of Information must be obtained prior to any record release in order to collaborate their care with an outside provider. Patient/Guardian was advised if they have not already done so to contact the registration department to  sign all necessary forms in order for [place] to release information regarding their care.   Consent: Patient/Guardian gives verbal consent for treatment and assignment of benefits for services provided during this visit. Patient/Guardian expressed understanding and agreed to proceed.  Discussed the use of a AI scribe software for clinical note transcription with the patient, who gave verbal consent to proceed.  This note was generated in part or whole with voice recognition software. Voice recognition is usually quite accurate but there are transcription errors that can and very often do occur. I apologize for any typographical errors that were not  detected and corrected.     Jomarie Longs, MD 10/20/2023, 12:48 PM

## 2023-10-19 ENCOUNTER — Ambulatory Visit: Payer: Self-pay | Admitting: Psychiatry

## 2023-10-19 ENCOUNTER — Encounter: Payer: Self-pay | Admitting: Psychiatry

## 2023-10-19 ENCOUNTER — Encounter: Payer: Self-pay | Admitting: Family Medicine

## 2023-10-19 VITALS — BP 138/88 | HR 106 | Temp 97.8°F | Ht 69.0 in | Wt 170.4 lb

## 2023-10-19 DIAGNOSIS — F5101 Primary insomnia: Secondary | ICD-10-CM

## 2023-10-19 DIAGNOSIS — F3342 Major depressive disorder, recurrent, in full remission: Secondary | ICD-10-CM | POA: Diagnosis not present

## 2023-10-19 DIAGNOSIS — F411 Generalized anxiety disorder: Secondary | ICD-10-CM

## 2023-10-19 DIAGNOSIS — F1021 Alcohol dependence, in remission: Secondary | ICD-10-CM | POA: Diagnosis not present

## 2023-10-19 DIAGNOSIS — R944 Abnormal results of kidney function studies: Secondary | ICD-10-CM

## 2023-10-19 MED ORDER — BUSPIRONE HCL 7.5 MG PO TABS: 7.5000 mg | ORAL_TABLET | Freq: Two times a day (BID) | ORAL | Status: DC

## 2023-10-25 ENCOUNTER — Encounter: Payer: Self-pay | Admitting: Family Medicine

## 2023-10-26 ENCOUNTER — Ambulatory Visit
Payer: Medicare Other | Attending: Student in an Organized Health Care Education/Training Program | Admitting: Student in an Organized Health Care Education/Training Program

## 2023-10-26 ENCOUNTER — Encounter: Payer: Self-pay | Admitting: Student in an Organized Health Care Education/Training Program

## 2023-10-26 VITALS — BP 157/80 | HR 77 | Temp 98.1°F | Ht 69.0 in | Wt 168.0 lb

## 2023-10-26 DIAGNOSIS — M5416 Radiculopathy, lumbar region: Secondary | ICD-10-CM | POA: Insufficient documentation

## 2023-10-26 DIAGNOSIS — M961 Postlaminectomy syndrome, not elsewhere classified: Secondary | ICD-10-CM | POA: Diagnosis not present

## 2023-10-26 DIAGNOSIS — M47812 Spondylosis without myelopathy or radiculopathy, cervical region: Secondary | ICD-10-CM | POA: Insufficient documentation

## 2023-10-26 DIAGNOSIS — M5030 Other cervical disc degeneration, unspecified cervical region: Secondary | ICD-10-CM | POA: Insufficient documentation

## 2023-10-26 DIAGNOSIS — G894 Chronic pain syndrome: Secondary | ICD-10-CM | POA: Insufficient documentation

## 2023-10-26 DIAGNOSIS — M48061 Spinal stenosis, lumbar region without neurogenic claudication: Secondary | ICD-10-CM | POA: Insufficient documentation

## 2023-10-26 MED ORDER — OXYCODONE-ACETAMINOPHEN 5-325 MG PO TABS: 1.0000 | ORAL_TABLET | Freq: Two times a day (BID) | ORAL | 0 refills | Status: AC | PRN

## 2023-10-26 MED ORDER — GABAPENTIN 400 MG PO CAPS: 400.0000 mg | ORAL_CAPSULE | Freq: Every day | ORAL | 5 refills | Status: DC

## 2023-10-26 MED ORDER — TOPIRAMATE 25 MG PO TABS: 25.0000 mg | ORAL_TABLET | Freq: Two times a day (BID) | ORAL | 11 refills | Status: DC

## 2023-10-26 MED ORDER — OXYCODONE-ACETAMINOPHEN 5-325 MG PO TABS: 1.0000 | ORAL_TABLET | Freq: Two times a day (BID) | ORAL | 0 refills | Status: DC | PRN

## 2023-10-26 NOTE — Progress Notes (Signed)
 PROVIDER NOTE: Information contained herein reflects review and annotations entered in association with encounter. Interpretation of such information and data should be left to medically-trained personnel. Information provided to patient can be located elsewhere in the medical record under "Patient Instructions". Document created using STT-dictation technology, any transcriptional errors that may result from process are unintentional.    Patient: Gabrielle Pineda  Service Category: E/M  Provider: Edward Jolly, MD  DOB: [birth year]  DOS: 10/26/2023  Referring Provider: Despina Arias  MRN: [id number]  Specialty: Interventional Pain Management  PCP: Sherlyn Hay, DO  Type: Established Patient  Setting: Ambulatory outpatient    Location: Office  Delivery: Face-to-face     HPI  [name], a 68 y.o. year old female, is here today because of her Lumbar foraminal stenosis [M48.061]. [name] primary complain today is Back Pain (lower)  Pertinent problems: Ms. Sadiq has Closed compression fracture of L5 vertebra (HCC); Cervical radiculopathy; Neuropathy; Spinal stenosis, lumbar region, with neurogenic claudication; Spondylosis of cervical region without myelopathy or radiculopathy; Cervical fusion syndrome; Chronic pain syndrome; Postlaminectomy syndrome, lumbar region; Chronic radicular lumbar pain; and Status post total hip replacement, right on their pertinent problem list. Pain Assessment: Severity of Chronic pain is reported as a 6 /10. Location: Back Lower/radiates to both hips. Onset: More than a month ago. Quality: Stabbing. Timing: Constant. Modifying factor(s): meds. Vitals:  height is 5\' 9"  (1.753 m) and weight is 168 lb (76.2 kg). Her temperature is 98.1 F (36.7 C). Her blood pressure is 157/80 (abnormal) and her pulse is 77. Her oxygen saturation is 97%.  BMI: Estimated body mass index is 24.81 kg/m as calculated from the following:   Height as of this encounter:  5\' 9"  (1.753 m).   Weight as of this encounter: 168 lb (76.2 kg). Last encounter: 04/22/2023. Last procedure: 12/14/2022.  Reason for encounter: medication management.  Discussed the use of AI scribe software for clinical note transcription with the patient, who gave verbal consent to proceed.  History of Present Illness   The patient presents with persistent runny nose and headaches after using nasal sprays.She has been experiencing a persistent runny nose for a couple of months, which she attributes to allergies or other causes. No infections or illnesses over the winter, but she notes developing fever blisters from frequent nose wiping. She is seeking alternative treatments for her runny nose.She reports using three nasal sprays, including one similar to Flonase, for about a month. Since starting these sprays, she has experienced daily, severe headaches. She believes the nasal sprays are the cause of her symptoms and is considering discontinuing them due to adverse effects.  She has a Administrator, [employer] implanted nearly three years ago. She is experiencing difficulty charging the device, which has led to rescheduling MRIs twice due to insufficient charge. We discussed options for a non-rechargeable battery.  Previous MRI also shows severe bilateral facet arthropathy and severe spinal canal stenosis and severe bilateral neuroforaminal narrowing at L4-L5.  Discussed referral to Dr. Katrinka Blazing with neurosurgery to consider L4-L5 decompression along with IPG conversion to a nonrechargeable battery.  Patient does have a history of mild dementia and I believe a nonrechargeable will be better for her.  She is currently taking oxycodone, gabapentin, and Topamax.       Pharmacotherapy Assessment  Analgesic: Percocet 5 mg BID STIMULATOR PRN  Monitoring: [place] PMP: PDMP reviewed during this encounter.       Pharmacotherapy: No side-effects or adverse  reactions reported. Compliance: No  problems identified. Effectiveness: Clinically acceptable.  Florina Ou, RN  10/26/2023  8:16 AM  Sign when Signing Visit Safety precautions to be maintained throughout the outpatient stay will include: orient to surroundings, keep bed in low position, maintain call bell within reach at all times, provide assistance with transfer out of bed and ambulation.   Nursing Pain Medication Assessment:  Safety precautions to be maintained throughout the outpatient stay will include: orient to surroundings, keep bed in low position, maintain call bell within reach at all times, provide assistance with transfer out of bed and ambulation.  Medication Inspection Compliance: Pill count conducted under aseptic conditions, in front of the patient. Neither the pills nor the bottle was removed from the patient's sight at any time. Once count was completed pills were immediately returned to the patient in their original bottle.  Medication: Oxycodone/APAP Pill/Patch Count:  17 of 60 pills remain Pill/Patch Appearance: Markings consistent with prescribed medication Bottle Appearance: Standard pharmacy container. Clearly labeled. Filled Date: 2 / 17 / 2025 Last Medication intake:  Today  No results found for: "CBDTHCR" No results found for: "D8THCCBX" No results found for: "D9THCCBX"  UDS:  Summary  Date Value Ref Range Status  10/06/2022 Note  Final    Comment:    ==================================================================== ToxASSURE Select 13 (MW) ==================================================================== Test                             Result       Flag       Units  Drug Present and Declared for Prescription Verification   Oxycodone                      1533         EXPECTED   ng/mg creat   Oxymorphone                    581          EXPECTED   ng/mg creat   Noroxycodone                   1658         EXPECTED   ng/mg creat   Noroxymorphone                 175          EXPECTED    ng/mg creat    Sources of oxycodone are scheduled prescription medications.    Oxymorphone, noroxycodone, and noroxymorphone are expected    metabolites of oxycodone. Oxymorphone is also available as a    scheduled prescription medication.  ==================================================================== Test                      Result    Flag   Units      Ref Range   Creatinine              36               mg/dL      >=16 ==================================================================== Declared Medications:  The flagging and interpretation on this report are based on the  following declared medications.  Unexpected results may arise from  inaccuracies in the declared medications.   **Note: The testing scope of this panel includes these medications:   Oxycodone   **Note: The testing scope of this panel does not include the  following reported medications:   Acetaminophen  Acyclovir (Zovirax)  Albuterol (Ventolin HFA)  Aripiprazole (Abilify)  Atorvastatin (Lipitor)  Budesonide (Breztri Aerosphere)  Carvedilol (Coreg)  Eye Drops  Ezetimibe (Zetia)  Formoterol (Breztri Aerosphere)  Gabapentin (Neurontin)  Glycopyrrolate (Breztri Aerosphere)  Leflunomide (Arava)  Linaclotide (Linzess)  Meloxicam (Mobic)  Mirtazapine (Remeron)  Montelukast (Singulair)  Multivitamin  Omeprazole (Prilosec)  Potassium (Klor-Con)  Topiramate (Topamax)  Valacyclovir (Valtrex)  Venlafaxine (Effexor) ==================================================================== For clinical consultation, please call [phone]. ====================================================================       ROS  Constitutional: Denies any fever or chills Gastrointestinal: No reported hemesis, hematochezia, vomiting, or acute GI distress Musculoskeletal:  Low back and bilateral leg pain Neurological: No reported episodes of acute onset apraxia, aphasia, dysarthria, agnosia, amnesia,  paralysis, loss of coordination, or loss of consciousness  Medication Review  Atogepant, Lifitegrast, Nebulizer, acyclovir ointment, albuterol, atorvastatin, azelastine, budeson-glycopyrrolate-formoterol, busPIRone, carvedilol, ezetimibe, fexofenadine, fluticasone, furosemide, gabapentin, hydrOXYzine, ipratropium, leflunomide, losartan, meloxicam, mirtazapine, montelukast, multivitamin with minerals, omeprazole, oxyCODONE-acetaminophen, potassium chloride SA, topiramate, valACYclovir, and venlafaxine XR  History Review  Allergy: Ms. Moehle is allergic to sulfasalazine and plaquenil [hydroxychloroquine]. Drug: Ms. Rhue  reports no history of drug use. Alcohol:  reports no history of alcohol use. Tobacco:  reports that she quit smoking about 2 years ago. Her smoking use included cigarettes. She started smoking about 48 years ago. She has a 90 pack-year smoking history. She has never used smokeless tobacco. Social: Ms. Towner  reports that she quit smoking about 2 years ago. Her smoking use included cigarettes. She started smoking about 48 years ago. She has a 90 pack-year smoking history. She has never used smokeless tobacco. She reports that she does not drink alcohol and does not use drugs. Medical:  has a past medical history of Alcohol abuse, Allergy (1983), Anemia, Anxiety, Arthritis, Cataract, Cervicalgia, Cirrhosis (HCC) (1994), COPD (chronic obstructive pulmonary disease) (HCC), Depression, Difficult intubation, Dyspnea, GERD (gastroesophageal reflux disease), Headache, Heart murmur, Hypertension, Neuromuscular disorder (HCC), Other and unspecified hyperlipidemia, Oxygen deficiency, Status post insertion of spinal cord stimulator, and Tachycardia. Surgical: Ms. Schriner  has a past surgical history that includes neck disc surgery; Breast enhancement surgery (1981); Esophagogastroduodenoscopy; Tonsillectomy and adenoidectomy (1973); Carpal tunnel release (11/11/2011); Carpal tunnel  release (2013); Rotator cuff repair (06/16/2016); Rotator cuff repair; Colonoscopy with propofol (N/A, 11/01/2017); Augmentation mammaplasty (Bilateral, 1982); Anterior cervical decomp/discectomy fusion (2012, 2015, 2018); Lumbar laminectomy/decompression microdiscectomy (N/A, 08/15/2019); Spinal cord stimulator insertion (N/A, 03/17/2021); Total hip arthroplasty (Right, 04/15/2021); Joint replacement (2022); Spine surgery (2020); Colonoscopy with propofol (N/A, 08/09/2023); Esophagogastroduodenoscopy (egd) with propofol (N/A, 08/09/2023); and polypectomy (08/09/2023). Family: family history includes Alcohol abuse in her father, mother, sister, and sister; Anxiety disorder in her sister; Arthritis in her mother; Bipolar disorder in her mother; Cancer in her father; Depression in her sister; Drug abuse in her sister; Early death in her mother; Pancreatic cancer in her father; Suicidality in her mother.  Laboratory Chemistry Profile   Renal Lab Results  Component Value Date   BUN 14 07/19/2023   CREATININE 1.33 (H) 07/19/2023   BCR 11 (L) 07/19/2023   GFRAA 72 05/06/2020   GFRNONAA >60 09/11/2022    Hepatic Lab Results  Component Value Date   AST 30 07/19/2023   ALT 19 07/19/2023   ALBUMIN 4.1 07/19/2023   ALKPHOS 133 (H) 07/19/2023   LIPASE 63 03/23/2018    Electrolytes Lab Results  Component Value Date   NA 142 07/19/2023   K 4.0 07/19/2023   CL 108 (H) 07/19/2023  CALCIUM 8.9 07/19/2023   PHOS 3.2 05/06/2020    Bone No results found for: "VD25OH", "VD125OH2TOT", "ZO1096EA5", "WU9811BJ4", "25OHVITD1", "25OHVITD2", "25OHVITD3", "TESTOFREE", "TESTOSTERONE"  Inflammation (CRP: Acute Phase) (ESR: Chronic Phase) No results found for: "CRP", "ESRSEDRATE", "LATICACIDVEN"       Note: Above Lab results reviewed.  Recent Imaging Review  ECHOCARDIOGRAM COMPLETE    ECHOCARDIOGRAM REPORT       Patient Name:   LOURENE HOSTON Date of Exam: 06/02/2023 Medical Rec #:  [id number]          Height:       69.0 in Accession #:    [id number]        Weight:       168.2 lb Date of Birth:  1955/12/08        BSA:          1.919 m Patient Age:    66 years          BP:           110/72 mmHg Patient Gender: F                 HR:           72 bpm. Exam Location:  [facility]  Procedure: 2D Echo, Cardiac Doppler and Color Doppler  Indications:     [place] R01.1   History:         Patient has no prior history of Echocardiogram examinations.                  COPD, Signs/Symptoms:Murmur; Risk Factors:Hypertension.                  Anxiety.   Sonographer:     Cristela Blue Referring Phys:  [id number] CARMEN Knox Saliva Diagnosing Phys: Julien Nordmann MD    Sonographer Comments: Suboptimal apical window. Image acquisition challenging due to COPD. IMPRESSIONS   1. Left ventricular ejection fraction, by estimation, is 60 to 65%. The left ventricle has normal function. The left ventricle has no regional wall motion abnormalities. There is mild left ventricular hypertrophy. Left ventricular diastolic parameters  are consistent with Grade I diastolic dysfunction (impaired relaxation).  2. Right ventricular systolic function is normal. The right ventricular size is normal.  3. The mitral valve is normal in structure. Mild mitral valve regurgitation. No evidence of mitral stenosis.  4. The aortic valve has an indeterminant number of cusps. There is mild calcification of the aortic valve. Aortic valve regurgitation is not visualized. Aortic valve sclerosis is present, with no evidence of aortic valve stenosis.  5. The inferior vena cava is normal in size with greater than 50% respiratory variability, suggesting right atrial pressure of 3 mmHg.  FINDINGS  Left Ventricle: Left ventricular ejection fraction, by estimation, is 60 to 65%. The left ventricle has normal function. The left ventricle has no regional wall motion abnormalities. The left ventricular internal cavity size was normal in size. There is  mild  left ventricular hypertrophy. Left ventricular diastolic parameters are consistent with Grade I diastolic dysfunction (impaired relaxation).  Right Ventricle: The right ventricular size is normal. No increase in right ventricular wall thickness. Right ventricular systolic function is normal.  Left Atrium: Left atrial size was normal in size.  Right Atrium: Right atrial size was normal in size.  Pericardium: There is no evidence of pericardial effusion.  Mitral Valve: The mitral valve is normal in structure. Mild mitral valve regurgitation. No evidence of mitral valve stenosis. MV peak gradient,  5.0 mmHg. The mean mitral valve gradient is 2.0 mmHg.  Tricuspid Valve: The tricuspid valve is normal in structure. Tricuspid valve regurgitation is not demonstrated. No evidence of tricuspid stenosis.  Aortic Valve: The aortic valve has an indeterminant number of cusps. There is mild calcification of the aortic valve. Aortic valve regurgitation is not visualized. Aortic valve sclerosis is present, with no evidence of aortic valve stenosis. Aortic valve  mean gradient measures 5.0 mmHg. Aortic valve peak gradient measures 9.2 mmHg. Aortic valve area, by VTI measures 2.18 cm.  Pulmonic Valve: The pulmonic valve was normal in structure. Pulmonic valve regurgitation is not visualized. No evidence of pulmonic stenosis.  Aorta: The aortic root is normal in size and structure.  Venous: The inferior vena cava is normal in size with greater than 50% respiratory variability, suggesting right atrial pressure of 3 mmHg.  IAS/Shunts: No atrial level shunt detected by color flow Doppler.    LEFT VENTRICLE PLAX 2D LVIDd:         3.90 cm   Diastology LVIDs:         2.70 cm   LV e' medial:    5.98 cm/s LV PW:         1.50 cm   LV E/e' medial:  13.8 LV IVS:        1.30 cm   LV e' lateral:   11.70 cm/s LVOT diam:     2.00 cm   LV E/e' lateral: 7.0 LV SV:         67 LV SV Index:   35 LVOT Area:     3.14 cm     RIGHT VENTRICLE RV Basal diam:  2.80 cm RV Mid diam:    2.30 cm RV S prime:     12.70 cm/s TAPSE (M-mode): 1.7 cm  LEFT ATRIUM           Index        RIGHT ATRIUM           Index LA diam:      2.90 cm 1.51 cm/m   RA Area:     10.30 cm LA Vol (A4C): 33.0 ml 17.19 ml/m  RA Volume:   18.00 ml  9.38 ml/m  AORTIC VALVE AV Area (Vmax):    2.37 cm AV Area (Vmean):   1.98 cm AV Area (VTI):     2.18 cm AV Vmax:           151.33 cm/s AV Vmean:          100.200 cm/s AV VTI:            0.305 m AV Peak Grad:      9.2 mmHg AV Mean Grad:      5.0 mmHg LVOT Vmax:         114.00 cm/s LVOT Vmean:        63.000 cm/s LVOT VTI:          0.212 m LVOT/AV VTI ratio: 0.70   AORTA Ao Root diam: 2.90 cm  MITRAL VALVE                TRICUSPID VALVE MV Area (PHT): 3.76 cm     TR Peak grad:   14.6 mmHg MV Area VTI:   2.25 cm     TR Vmax:        191.00 cm/s MV Peak grad:  5.0 mmHg MV Mean grad:  2.0 mmHg     SHUNTS MV Vmax:  1.12 m/s     Systemic VTI:  0.21 m MV Vmean:      68.5 cm/s    Systemic Diam: 2.00 cm MV Decel Time: 202 msec MV E velocity: 82.30 cm/s MV A velocity: 110.00 cm/s MV E/A ratio:  0.75  Julien Nordmann MD Electronically signed by Julien Nordmann MD Signature Date/Time: 06/02/2023/1:08:03 PM      Final   Note: Reviewed        Physical Exam  General appearance: Well nourished, well developed, and well hydrated. In no apparent acute distress Mental status: Alert, oriented x 3 (person, place, & time)       Respiratory: No evidence of acute respiratory distress Eyes: PERLA Vitals: BP (!) 157/80   Pulse 77   Temp 98.1 F (36.7 C)   Ht 5\' 9"  (1.753 m)   Wt 168 lb (76.2 kg)   SpO2 97%   BMI 24.81 kg/m  BMI: Estimated body mass index is 24.81 kg/m as calculated from the following:   Height as of this encounter: 5\' 9"  (1.753 m).   Weight as of this encounter: 168 lb (76.2 kg). Ideal: Ideal body weight: 66.2 kg (145 lb 15.1 oz) Adjusted ideal body weight:  70.2 kg (154 lb 12.3 oz)  + LBP  Assessment   Diagnosis Status  1. Lumbar foraminal stenosis   2. Postlaminectomy syndrome, lumbar region   3. Lumbar radiculopathy (R>L)   4. DDD (degenerative disc disease), cervical   5. Failed back surgical syndrome   6. Chronic pain syndrome   7. Spondylosis of cervical region without myelopathy or radiculopathy    Controlled Controlled Controlled   Updated Problems: No problems updated.  Plan of Care  Assessment and Plan    Spinal cord stimulator management   She has a Environmental manager spinal cord stimulator implanted three years ago and is experiencing difficulty charging the device. Considering switching to a non-rechargeable battery, which would eliminate charging issues but require replacement every five to seven years. Refer to Dr. Katrinka Blazing to discuss switching to a non-rechargeable battery. Evaluate for L4-L5 decompression based on recent MRI findings and symptoms.   Lumbar spinal stenosis (central and foraminal at L4-L5, severe) She had surgery at L3-L4 and L5-S1. Recent MRI shows stenosis at L4-L5, which may require decompression. Discuss potential L4-L5 decompression with Dr. Katrinka Blazing during the battery replacement consultation.  Chronic pain management   She is on a pain management regimen including oxycodone, gabapentin, and topiramate.  Refills as below.  Update urine toxicology screen for medication compliance and monitoring.  Chronic rhinitis   She has been experiencing rhinorrhea for a couple of months, potentially due to allergies or another cause. Using three nasal sprays, including Flonase, for about a month, which she believes are causing daily headaches. No ENT specialist evaluation yet.  Discussed with PCP and refer to ENT specialist for further evaluation of chronic rhinitis and potential causes of rhinorrhea.         Ms. Tersea Aulds Gillingham has a current medication list which includes the following long-term medication(s):  albuterol, albuterol, atorvastatin, azelastine, carvedilol, ezetimibe, fexofenadine, fluticasone, furosemide, ipratropium, losartan, mirtazapine, montelukast, omeprazole, potassium chloride sa, venlafaxine xr, gabapentin, and topiramate.  Pharmacotherapy (Medications Ordered): Meds ordered this encounter  Medications   oxyCODONE-acetaminophen (PERCOCET) 5-325 MG tablet    Sig: Take 1 tablet by mouth every 12 (twelve) hours as needed for severe pain (pain score 7-10). Must last 30 days.    Dispense:  60 tablet    Refill:  0    Chronic Pain: STOP Act (Not applicable) Fill 1 day early if closed on refill date. Avoid benzodiazepines within 8 hours of opioids   oxyCODONE-acetaminophen (PERCOCET) 5-325 MG tablet    Sig: Take 1 tablet by mouth every 12 (twelve) hours as needed for severe pain (pain score 7-10). Must last 30 days.    Dispense:  60 tablet    Refill:  0    Chronic Pain: STOP Act (Not applicable) Fill 1 day early if closed on refill date. Avoid benzodiazepines within 8 hours of opioids   oxyCODONE-acetaminophen (PERCOCET) 5-325 MG tablet    Sig: Take 1 tablet by mouth every 12 (twelve) hours as needed for severe pain (pain score 7-10). Must last 30 days.    Dispense:  60 tablet    Refill:  0    Chronic Pain: STOP Act (Not applicable) Fill 1 day early if closed on refill date. Avoid benzodiazepines within 8 hours of opioids   gabapentin (NEURONTIN) 400 MG capsule    Sig: Take 1 capsule (400 mg total) by mouth at bedtime.    Dispense:  30 capsule    Refill:  5   topiramate (TOPAMAX) 25 MG tablet    Sig: Take 1 tablet (25 mg total) by mouth 2 (two) times daily.    Dispense:  60 tablet    Refill:  11   Orders:  Orders Placed This Encounter  Procedures   ToxASSURE Select 13 (MW), Urine    Volume: 30 ml(s). Minimum 3 ml of urine is needed. Document temperature of fresh sample. Indications: Long term (current) use of opiate analgesic (N56.213)    Release to patient:   Immediate    Ambulatory referral to Neurosurgery    Referral Priority:   Routine    Referral Type:   Surgical    Referral Reason:   Specialty Services Required    Referred to Provider:   Lovenia Kim, MD    Requested Specialty:   Neurosurgery    Number of Visits Requested:   1   Follow-up plan:   Return in about 14 weeks (around 02/01/2024) for MM, F2F.     Recent Visits No visits were found meeting these conditions. Showing recent visits within past 90 days and meeting all other requirements Today's Visits Date Type Provider Dept  10/26/23 Office Visit Edward Jolly, MD [facility]  Showing today's visits and meeting all other requirements Future Appointments Date Type Provider Dept  01/20/24 Appointment Edward Jolly, MD [facility]  Showing future appointments within next 90 days and meeting all other requirements  I discussed the assessment and treatment plan with the patient. The patient was provided an opportunity to ask questions and all were answered. The patient agreed with the plan and demonstrated an understanding of the instructions.  Patient advised to call back or seek an in-person evaluation if the symptoms or condition worsens.  Duration of encounter: .  Total time on encounter, as per AMA guidelines included both the face-to-face and non-face-to-face time personally spent by the physician and/or other qualified health care professional(s) on the day of the encounter (includes time in activities that require the physician or other qualified health care professional and does not include time in activities normally performed by clinical staff). Physician's time may include the following activities when performed: Preparing to see the patient (e.g., pre-charting review of records, searching for previously ordered imaging, lab work, and nerve conduction tests) Review of prior analgesic pharmacotherapies. Reviewing  PMP Interpreting ordered tests (e.g.,  lab work, imaging, nerve conduction tests) Performing post-procedure evaluations, including interpretation of diagnostic procedures Obtaining and/or reviewing separately obtained history Performing a medically appropriate examination and/or evaluation Counseling and educating the patient/family/caregiver Ordering medications, tests, or procedures Referring and communicating with other health care professionals (when not separately reported) Documenting clinical information in the electronic or other health record Independently interpreting results (not separately reported) and communicating results to the patient/ family/caregiver Care coordination (not separately reported)  Note by: Edward Jolly, MD Date: 10/26/2023; Time: 10:06 AM

## 2023-10-26 NOTE — Progress Notes (Signed)
 Safety precautions to be maintained throughout the outpatient stay will include: orient to surroundings, keep bed in low position, maintain call bell within reach at all times, provide assistance with transfer out of bed and ambulation.   Nursing Pain Medication Assessment:  Safety precautions to be maintained throughout the outpatient stay will include: orient to surroundings, keep bed in low position, maintain call bell within reach at all times, provide assistance with transfer out of bed and ambulation.  Medication Inspection Compliance: Pill count conducted under aseptic conditions, in front of the patient. Neither the pills nor the bottle was removed from the patient's sight at any time. Once count was completed pills were immediately returned to the patient in their original bottle.  Medication: Oxycodone/APAP Pill/Patch Count:  17 of 60 pills remain Pill/Patch Appearance: Markings consistent with prescribed medication Bottle Appearance: Standard pharmacy container. Clearly labeled. Filled Date: 2 / 17 / 2025 Last Medication intake:  Today

## 2023-10-27 ENCOUNTER — Encounter: Payer: Self-pay | Admitting: Neurosurgery

## 2023-10-27 ENCOUNTER — Other Ambulatory Visit: Payer: Self-pay | Admitting: Neurosurgery

## 2023-10-27 ENCOUNTER — Encounter: Payer: Self-pay | Admitting: Family Medicine

## 2023-10-27 ENCOUNTER — Other Ambulatory Visit: Payer: Self-pay

## 2023-10-27 ENCOUNTER — Ambulatory Visit
Admission: RE | Admit: 2023-10-27 | Discharge: 2023-10-27 | Disposition: A | Discharge status: Home / Self Care | Attending: Neurosurgery | Admitting: Neurosurgery

## 2023-10-27 ENCOUNTER — Ambulatory Visit
Admission: RE | Admit: 2023-10-27 | Discharge: 2023-10-27 | Disposition: A | Discharge status: Home / Self Care | Source: Ambulatory Visit | Attending: Neurosurgery | Admitting: Neurosurgery

## 2023-10-27 DIAGNOSIS — M48062 Spinal stenosis, lumbar region with neurogenic claudication: Secondary | ICD-10-CM | POA: Insufficient documentation

## 2023-10-27 DIAGNOSIS — I10 Essential (primary) hypertension: Secondary | ICD-10-CM

## 2023-10-27 NOTE — Progress Notes (Deleted)
 Referring Physician:  Edward Jolly, MD [address] [place],  [place] [address]  Primary Physician:  Sherlyn Hay, DO  History of Present Illness: 10/27/2023 [name] is here today with a chief complaint of ***  [employer] spinal cord stimulator implanted nearly three years ago. She is experiencing difficulty charging the device    Duration: *** Location: *** Quality: *** Severity: ***  Precipitating: aggravated by *** Modifying factors: made better by *** Weakness: none Timing: *** Bowel/Bladder Dysfunction: none  Conservative measures:  Physical therapy: Has not participated in PT  Multimodal medical therapy including regular antiinflammatories: Gabapentin, Meloxicam, Oxycodone,   Injections: none epidural steroid injections  Past Surgery: 08/15/2019 L3-S1 Decompression Surgeon: [name], MD 03/17/2021 Thoracic SCS Surgeon: [name], MD  Brunetta Genera has ***no symptoms of cervical myelopathy.  The symptoms are causing a significant impact on the patient's life.   I have utilized the care everywhere function in epic to review the outside records available from external health systems.  Review of Systems:  A 10 point review of systems is negative, except for the pertinent positives and negatives detailed in the HPI.  Past Medical History: Past Medical History:  Diagnosis Date   Alcohol abuse    Allergy 1983   Anemia    Anxiety    Arthritis    Cataract    Cervicalgia    Cirrhosis (HCC) 1994   COPD (chronic obstructive pulmonary disease) (HCC)    Depression    Difficult intubation    has plates and screws in neck   Dyspnea    GERD (gastroesophageal reflux disease)    Headache    Heart murmur    on heard when pt is lying   Hypertension    Neuromuscular disorder (HCC)    Neuropathy   Other and unspecified hyperlipidemia    Oxygen deficiency    Status post insertion of spinal cord stimulator    Tachycardia    d/t  questionable anxiety happens every 5- 10 years, sees [facility]    Past Surgical History: Past Surgical History:  Procedure Laterality Date   ANTERIOR CERVICAL DECOMP/DISCECTOMY FUSION  2012, 2015, 2018   x3   AUGMENTATION MAMMAPLASTY Bilateral 1982   BREAST ENHANCEMENT SURGERY  1981   CARPAL TUNNEL RELEASE  11/11/2011   Procedure: CARPAL TUNNEL RELEASE;  Surgeon: [name], MD;  Location: [facility];  Service: Neurosurgery;  Laterality: Right;  Right Median Nerve Decompression   CARPAL TUNNEL RELEASE  2013   COLONOSCOPY WITH PROPOFOL N/A 11/01/2017   Procedure: COLONOSCOPY WITH PROPOFOL;  Surgeon: [name], MD;  Location: [facility] ENDOSCOPY;  Service: Endoscopy;  Laterality: N/A;   COLONOSCOPY WITH PROPOFOL N/A 08/09/2023   Procedure: COLONOSCOPY WITH PROPOFOL;  Surgeon: Jaynie Collins, DO;  Location: [facility] ENDOSCOPY;  Service: Gastroenterology;  Laterality: N/A;   ESOPHAGOGASTRODUODENOSCOPY     ESOPHAGOGASTRODUODENOSCOPY (EGD) WITH PROPOFOL N/A 08/09/2023   Procedure: ESOPHAGOGASTRODUODENOSCOPY (EGD) WITH PROPOFOL;  Surgeon: Jaynie Collins, DO;  Location: [facility] ENDOSCOPY;  Service: Gastroenterology;  Laterality: N/A;   JOINT REPLACEMENT  2022   LUMBAR LAMINECTOMY/DECOMPRESSION MICRODISCECTOMY N/A 08/15/2019   Procedure: Lumbar three to Sacral one Decompressive lumbar laminectomy;  Surgeon: [name], MD;  [facility];  Service: Neurosurgery;  Laterality: N/A;   neck disc surgery     plates and screws in neck x 2   POLYPECTOMY  08/09/2023   Procedure: POLYPECTOMY;  Surgeon: Jaynie Collins, DO;  Location: [facility]  ENDOSCOPY;  Service: Gastroenterology;;   ROTATOR CUFF REPAIR  06/16/2016   right shoulder    ROTATOR CUFF REPAIR     SPINAL CORD STIMULATOR INSERTION N/A 03/17/2021   Procedure: THORACIC SPINAL CORD STIMULATOR (PERCUTANEOUS) & PULSE GENERATOR PLACEMENT;  Surgeon: [name], MD;  Location: [facility] ORS;  Service: Neurosurgery;   Laterality: N/A;   SPINE SURGERY  2020   TONSILLECTOMY AND ADENOIDECTOMY  1973   TOTAL HIP ARTHROPLASTY Right 04/15/2021   Procedure: TOTAL HIP ARTHROPLASTY ANTERIOR APPROACH;  Surgeon: [name], MD;  Location: [facility] ORS;  Service: Orthopedics;  Laterality: Right;    Allergies: Allergies as of 10/29/2023 - Review Complete 10/26/2023  Allergen Reaction Noted   Sulfasalazine Other (See Comments) 04/17/2022   Plaquenil [hydroxychloroquine] Rash 04/03/2015    Medications:  Current Outpatient Medications:    acyclovir ointment (ZOVIRAX) 5 %, :Topical Every 3 Hours as needed for lesion, Disp: 30 g, Rfl: 1   albuterol (PROVENTIL) (2.5 MG/3ML) 0.083% nebulizer solution, Take 3 mLs (2.5 mg total) by nebulization every 4 (four) hours as needed for wheezing or shortness of breath., Disp: 225 mL, Rfl: 2   albuterol (VENTOLIN HFA) 108 (90 Base) MCG/ACT inhaler, INHALE TWO (2) PUFFS BY MOUTH AND INTO THE LUNGS EVERY 6 HOURS AS NEEDED FOR SHORTNESS OF BREATH, Disp: 8.5 g, Rfl: 10   Atogepant 60 MG TABS, Take by mouth., Disp: , Rfl:    atorvastatin (LIPITOR) 40 MG tablet, Take 1 tablet (40 mg total) by mouth daily., Disp: 90 tablet, Rfl: 3   azelastine (ASTELIN) 0.1 % nasal spray, Place 2 sprays into both nostrils 2 (two) times daily. Use in each nostril as directed, Disp: 30 mL, Rfl: 1   Budeson-Glycopyrrol-Formoterol (BREZTRI AEROSPHERE) 160-9-4.8 MCG/ACT AERO, Inhale 2 puffs into the lungs in the morning and at bedtime., Disp: 3 each, Rfl: 3   busPIRone (BUSPAR) 7.5 MG tablet, Take 1 tablet (7.5 mg total) by mouth 2 (two) times daily. Hold for now, Disp: , Rfl:    carvedilol (COREG) 12.5 MG tablet, TAKE ONE TABLET BY MOUTH AT BREAKFAST AND AT BEDTIME, Disp: 180 tablet, Rfl: 3   ezetimibe (ZETIA) 10 MG tablet, Take 1 tablet (10 mg total) by mouth daily., Disp: 90 tablet, Rfl: 3   fexofenadine (ALLEGRA) 180 MG tablet, Take 180 mg by mouth daily., Disp: , Rfl:    fluticasone (FLONASE) 50 MCG/ACT  nasal spray, Place 2 sprays into both nostrils in the morning and at bedtime., Disp: 16 g, Rfl: 1   furosemide (LASIX) 20 MG tablet, TAKE 1 TABLET BY MOUTH ONCE DAILY, Disp: 30 tablet, Rfl: 2   gabapentin (NEURONTIN) 400 MG capsule, Take 1 capsule (400 mg total) by mouth at bedtime., Disp: 30 capsule, Rfl: 5   hydrOXYzine (ATARAX) 10 MG tablet, TAKE 1-2 TABLETS BY MOUTH TWICE DAILY AS NEEDED FOR SEVERE ANXIETY ATTACKS ONLY, Disp: 120 tablet, Rfl: 10   ipratropium (ATROVENT) 0.03 % nasal spray, Place 2 sprays into both nostrils 3 (three) times daily., Disp: 30 mL, Rfl: 1   leflunomide (ARAVA) 20 MG tablet, Take 20 mg by mouth at bedtime., Disp: , Rfl:    losartan (COZAAR) 25 MG tablet, TAKE ONE TABLET BY MOUTH EVERYDAY AT BEDTIME, Disp: 90 tablet, Rfl: 1   meloxicam (MOBIC) 15 MG tablet, Take 15 mg by mouth at bedtime., Disp: , Rfl:    mirtazapine (REMERON) 15 MG tablet, TAKE 1 TABLET BY MOUTH EVERY DAY AT BEDTIME, Disp: 30 tablet, Rfl: 10   montelukast (SINGULAIR) 10  MG tablet, TAKE 1 TABLET BY MOUTH EVERY DAY AT BEDTIME, Disp: 30 tablet, Rfl: 6   Multiple Vitamin (MULTIVITAMIN WITH MINERALS) TABS tablet, Take 1 tablet by mouth daily., Disp: , Rfl:    Nebulizer MISC, Compressor nebulizer to use with nebulized medications as instructed, Disp: 1 each, Rfl: 0   omeprazole (PRILOSEC) 40 MG capsule, TAKE ONE CAPSULE BY MOUTH AT BREAKFAST AND AT BEDTIME, Disp: 180 capsule, Rfl: 1   [START ON 11/03/2023] oxyCODONE-acetaminophen (PERCOCET) 5-325 MG tablet, Take 1 tablet by mouth every 12 (twelve) hours as needed for severe pain (pain score 7-10). Must last 30 days., Disp: 60 tablet, Rfl: 0   [START ON 12/03/2023] oxyCODONE-acetaminophen (PERCOCET) 5-325 MG tablet, Take 1 tablet by mouth every 12 (twelve) hours as needed for severe pain (pain score 7-10). Must last 30 days., Disp: 60 tablet, Rfl: 0   [START ON 01/02/2024] oxyCODONE-acetaminophen (PERCOCET) 5-325 MG tablet, Take 1 tablet by mouth every 12 (twelve)  hours as needed for severe pain (pain score 7-10). Must last 30 days., Disp: 60 tablet, Rfl: 0   potassium chloride SA (KLOR-CON M) 20 MEQ tablet, TAKE 1 TABLET BY MOUTH EVERY MORNING, Disp: 90 tablet, Rfl: 1   topiramate (TOPAMAX) 25 MG tablet, Take 1 tablet (25 mg total) by mouth 2 (two) times daily., Disp: 60 tablet, Rfl: 11   valACYclovir (VALTREX) 1000 MG tablet, Take 2 tablets (2,000 mg total) by mouth daily., Disp: 10 tablet, Rfl: 1   venlafaxine XR (EFFEXOR-XR) 75 MG 24 hr capsule, Take 2 capsules (150 mg total) by mouth daily with breakfast., Disp: 60 capsule, Rfl: 1   XIIDRA 5 % SOLN, Place 1 drop into both eyes daily., Disp: , Rfl:   Social History: Social History   Tobacco Use   Smoking status: Former    Current packs/day: 0.00    Average packs/day: 2.0 packs/day for 45.0 years (90.0 ttl pk-yrs)    Types: Cigarettes    Start date: 10/29/1975    Quit date: 10/28/2020    Years since quitting: 2.9   Smokeless tobacco: Never   Tobacco comments:    Quit smoking in 2022- khj 09/24/2023  Vaping Use   Vaping status: Former   Devices: vape occasionally  Substance Use Topics   Alcohol use: No    Comment: recovering alcoholic Since 1994    Drug use: Never    Family Medical History: Family History  Problem Relation Age of Onset   Alcohol abuse Mother    Bipolar disorder Mother    Suicidality Mother    Arthritis Mother    Early death Mother    Pancreatic cancer Father    Alcohol abuse Father    Cancer Father    Drug abuse Sister    Alcohol abuse Sister    Depression Sister    Alcohol abuse Sister    Anxiety disorder Sister    Anesthesia problems Neg Hx    Breast cancer Neg Hx     Physical Examination: There were no vitals filed for this visit.  General: Patient is in no apparent distress. Attention to examination is appropriate.  Neck:   Supple.  Full range of motion.  Respiratory: Patient is breathing without any difficulty.   NEUROLOGICAL:     Awake, alert,  oriented to person, place, and time.  Speech is clear and fluent.   Cranial Nerves: Pupils equal round and reactive to light.  Facial tone is symmetric.  Facial sensation is symmetric. Shoulder shrug is symmetric. Tongue protrusion  is midline.    Strength: Side Biceps Triceps Deltoid Interossei Grip Wrist Ext. Wrist Flex.  R 5 5 5 5 5 5 5   L 5 5 5 5 5 5 5    Side Iliopsoas Quads Hamstring PF DF EHL  R 5 5 5 5 5 5   L 5 5 5 5 5 5    Reflexes are ***2+ and symmetric at the biceps, triceps, brachioradialis, patella and achilles.   Hoffman's is absent. Clonus is absent  Bilateral upper and lower extremity sensation is intact to light touch ***.     No evidence of dysmetria noted.  Gait is normal.    Imaging: *** I have personally reviewed the images and agree with the above interpretation.  Medical Decision Making/Assessment and Plan: Ms. Costabile is a pleasant 68 y.o. female with ***  Spinal stenosis, lumbar region, with neurogenic claudication ***    Thank you for involving me in the care of this patient.    Lovenia Kim MD/MSCR Neurosurgery

## 2023-10-29 ENCOUNTER — Ambulatory Visit: Admitting: Neurosurgery

## 2023-10-29 DIAGNOSIS — M48062 Spinal stenosis, lumbar region with neurogenic claudication: Secondary | ICD-10-CM

## 2023-10-29 LAB — TOXASSURE SELECT 13 (MW), URINE

## 2023-11-01 ENCOUNTER — Encounter: Payer: Self-pay | Admitting: Podiatry

## 2023-11-01 ENCOUNTER — Ambulatory Visit: Payer: Medicare Other | Admitting: Podiatry

## 2023-11-01 VITALS — Ht 69.0 in | Wt 168.0 lb

## 2023-11-01 DIAGNOSIS — L84 Corns and callosities: Secondary | ICD-10-CM | POA: Diagnosis not present

## 2023-11-01 DIAGNOSIS — M79674 Pain in right toe(s): Secondary | ICD-10-CM

## 2023-11-01 DIAGNOSIS — G629 Polyneuropathy, unspecified: Secondary | ICD-10-CM | POA: Diagnosis not present

## 2023-11-01 DIAGNOSIS — M79675 Pain in left toe(s): Secondary | ICD-10-CM | POA: Diagnosis not present

## 2023-11-01 DIAGNOSIS — B351 Tinea unguium: Secondary | ICD-10-CM

## 2023-11-02 ENCOUNTER — Other Ambulatory Visit: Payer: Self-pay | Admitting: Psychiatry

## 2023-11-02 DIAGNOSIS — F3342 Major depressive disorder, recurrent, in full remission: Secondary | ICD-10-CM

## 2023-11-02 DIAGNOSIS — F411 Generalized anxiety disorder: Secondary | ICD-10-CM

## 2023-11-03 ENCOUNTER — Telehealth: Payer: Self-pay | Admitting: Family Medicine

## 2023-11-03 DIAGNOSIS — R339 Retention of urine, unspecified: Secondary | ICD-10-CM

## 2023-11-03 NOTE — Telephone Encounter (Signed)
 [facility] is requesting refill potassium chloride SA (KLOR-CON M) 20 MEQ tablet   Please advise

## 2023-11-04 MED ORDER — LOSARTAN POTASSIUM 25 MG PO TABS: 25.0000 mg | ORAL_TABLET | Freq: Every day | ORAL | 1 refills | Status: DC

## 2023-11-05 MED ORDER — POTASSIUM CHLORIDE CRYS ER 20 MEQ PO TBCR
20.0000 meq | EXTENDED_RELEASE_TABLET | Freq: Every morning | ORAL | 1 refills | Status: DC
Start: 2023-11-05 — End: 2024-05-03

## 2023-11-05 NOTE — Progress Notes (Unsigned)
 Referring Physician:  Edward Jolly, MD [address] [place],  [place] [address]  Primary Physician:  Sherlyn Hay, DO  History of Present Illness: 11/08/2023 Gabrielle Pineda is here today with a chief complaint of battery at the end of its life.  She had a spinal cord stimulator implanted 3 years ago but has difficulty with regular charging so has not been able to follow along with therapeutic treatment.  This been going on for over 3 years.  She would like to switch her device out to a charge of the stimulator.  She continues to have significant back pain, get some claudicatory symptoms.  Has also a history of chronic myelopathy with weakness and gait instability.  She does feel like her stumbling may be getting worse.  She has not worked out with any recent physical therapy.  She has not had any recent injections.  In 2020 had a L3-S1 decompression with Dr. Venetia Maxon, had a thoracic spinal cord stimulator placed in 2022.  Conservative measures:  Physical therapy: Has not participated in PT  Multimodal medical therapy including regular antiinflammatories: Gabapentin, Meloxicam, Oxycodone,   Injections: none epidural steroid injections  Past Surgery: 08/15/2019 L3-S1 Decompression Surgeon: [name], MD 03/17/2021 Thoracic SCS Surgeon: [name], MD  I have utilized the care everywhere function in epic to review the outside records available from external health systems.  Specifically reviewed Dr. Patsey Berthold operative note from August 2022 and postoperative notes from Dr. Venetia Maxon in 2021.  Review of Systems:  A 10 point review of systems is negative, except for the pertinent positives and negatives detailed in the HPI.  Past Medical History: Past Medical History:  Diagnosis Date   Alcohol abuse    Allergy 1983   Anemia    Anxiety    Arthritis    Cataract    Cervicalgia    Cirrhosis (HCC) 1994   COPD (chronic obstructive pulmonary disease) (HCC)    Depression     Difficult intubation    has plates and screws in neck   Dyspnea    GERD (gastroesophageal reflux disease)    Headache    Heart murmur    on heard when pt is lying   Hypertension    Neuromuscular disorder (HCC)    Neuropathy   Other and unspecified hyperlipidemia    Oxygen deficiency    Status post insertion of spinal cord stimulator    Tachycardia    d/t questionable anxiety happens every 5- 10 years, sees [facility]    Past Surgical History: Past Surgical History:  Procedure Laterality Date   ANTERIOR CERVICAL DECOMP/DISCECTOMY FUSION  2012, 2015, 2018   x3   AUGMENTATION MAMMAPLASTY Bilateral 1982   BREAST ENHANCEMENT SURGERY  1981   CARPAL TUNNEL RELEASE  11/11/2011   Procedure: CARPAL TUNNEL RELEASE;  Surgeon: [name], MD;  Location: [facility];  Service: Neurosurgery;  Laterality: Right;  Right Median Nerve Decompression   CARPAL TUNNEL RELEASE  2013   COLONOSCOPY WITH PROPOFOL N/A 11/01/2017   Procedure: COLONOSCOPY WITH PROPOFOL;  Surgeon: [name], MD;  Location: [facility] ENDOSCOPY;  Service: Endoscopy;  Laterality: N/A;   COLONOSCOPY WITH PROPOFOL N/A 08/09/2023   Procedure: COLONOSCOPY WITH PROPOFOL;  Surgeon: Jaynie Collins, DO;  Location: [facility] ENDOSCOPY;  Service: Gastroenterology;  Laterality: N/A;   ESOPHAGOGASTRODUODENOSCOPY     ESOPHAGOGASTRODUODENOSCOPY (EGD) WITH PROPOFOL N/A 08/09/2023   Procedure: ESOPHAGOGASTRODUODENOSCOPY (EGD) WITH PROPOFOL;  Surgeon: Jaynie Collins, DO;  Location: [facility]  ENDOSCOPY;  Service: Gastroenterology;  Laterality: N/A;   JOINT REPLACEMENT  2022   LUMBAR LAMINECTOMY/DECOMPRESSION MICRODISCECTOMY N/A 08/15/2019   Procedure: Lumbar three to Sacral one Decompressive lumbar laminectomy;  Surgeon: [name], MD;  Location: [facility] OR;  Service: Neurosurgery;  Laterality: N/A;   neck disc surgery     plates and screws in neck x 2   POLYPECTOMY  08/09/2023   Procedure: POLYPECTOMY;  Surgeon: Jaynie Collins, DO;  Location: [facility] ENDOSCOPY;  Service: Gastroenterology;;   ROTATOR CUFF REPAIR  06/16/2016   right shoulder    ROTATOR CUFF REPAIR     SPINAL CORD STIMULATOR INSERTION N/A 03/17/2021   Procedure: THORACIC SPINAL CORD STIMULATOR (PERCUTANEOUS) & PULSE GENERATOR PLACEMENT;  Surgeon: [name], MD;  Location: [facility] ORS;  Service: Neurosurgery;  Laterality: N/A;   SPINE SURGERY  2020   TONSILLECTOMY AND ADENOIDECTOMY  1973   TOTAL HIP ARTHROPLASTY Right 04/15/2021   Procedure: TOTAL HIP ARTHROPLASTY ANTERIOR APPROACH;  Surgeon: [name], MD;  Location: [facility] ORS;  Service: Orthopedics;  Laterality: Right;    Allergies: Allergies as of 11/08/2023 - Review Complete 11/08/2023  Allergen Reaction Noted   Sulfasalazine Other (See Comments) 04/17/2022   Plaquenil [hydroxychloroquine] Rash 04/03/2015    Medications:  Current Outpatient Medications:    acyclovir ointment (ZOVIRAX) 5 %, :Topical Every 3 Hours as needed for lesion, Disp: 30 g, Rfl: 1   albuterol (VENTOLIN HFA) 108 (90 Base) MCG/ACT inhaler, INHALE TWO (2) PUFFS BY MOUTH AND INTO THE LUNGS EVERY 6 HOURS AS NEEDED FOR SHORTNESS OF BREATH, Disp: 8.5 g, Rfl: 10   Atogepant 60 MG TABS, Take by mouth., Disp: , Rfl:    atorvastatin (LIPITOR) 40 MG tablet, Take 1 tablet (40 mg total) by mouth daily., Disp: 90 tablet, Rfl: 3   azelastine (ASTELIN) 0.1 % nasal spray, Place 2 sprays into both nostrils 2 (two) times daily. Use in each nostril as directed, Disp: 30 mL, Rfl: 1   Budeson-Glycopyrrol-Formoterol (BREZTRI AEROSPHERE) 160-9-4.8 MCG/ACT AERO, Inhale 2 puffs into the lungs in the morning and at bedtime., Disp: 3 each, Rfl: 3   busPIRone (BUSPAR) 7.5 MG tablet, Take 1 tablet (7.5 mg total) by mouth 2 (two) times daily. Hold for now, Disp: , Rfl:    carvedilol (COREG) 12.5 MG tablet, TAKE ONE TABLET BY MOUTH AT BREAKFAST AND AT BEDTIME, Disp: 180 tablet, Rfl: 3   ezetimibe (ZETIA) 10 MG tablet, Take 1 tablet (10 mg  total) by mouth daily., Disp: 90 tablet, Rfl: 3   fexofenadine (ALLEGRA) 180 MG tablet, Take 180 mg by mouth daily., Disp: , Rfl:    fluticasone (FLONASE) 50 MCG/ACT nasal spray, Place 2 sprays into both nostrils in the morning and at bedtime., Disp: 16 g, Rfl: 1   furosemide (LASIX) 20 MG tablet, TAKE 1 TABLET BY MOUTH ONCE DAILY, Disp: 30 tablet, Rfl: 2   gabapentin (NEURONTIN) 400 MG capsule, Take 1 capsule (400 mg total) by mouth at bedtime., Disp: 30 capsule, Rfl: 5   hydrOXYzine (ATARAX) 10 MG tablet, TAKE 1-2 TABLETS BY MOUTH TWICE DAILY AS NEEDED FOR SEVERE ANXIETY ATTACKS ONLY, Disp: 120 tablet, Rfl: 10   ipratropium (ATROVENT) 0.03 % nasal spray, Place 2 sprays into both nostrils 3 (three) times daily., Disp: 30 mL, Rfl: 1   leflunomide (ARAVA) 20 MG tablet, Take 20 mg by mouth at bedtime., Disp: , Rfl:    meloxicam (MOBIC) 15 MG tablet, Take 15 mg by mouth at bedtime., Disp: , Rfl:  mirtazapine (REMERON) 15 MG tablet, TAKE 1 TABLET BY MOUTH EVERY DAY AT BEDTIME, Disp: 30 tablet, Rfl: 10   montelukast (SINGULAIR) 10 MG tablet, TAKE 1 TABLET BY MOUTH EVERY DAY AT BEDTIME, Disp: 30 tablet, Rfl: 6   Multiple Vitamin (MULTIVITAMIN WITH MINERALS) TABS tablet, Take 1 tablet by mouth daily., Disp: , Rfl:    Nebulizer MISC, Compressor nebulizer to use with nebulized medications as instructed, Disp: 1 each, Rfl: 0   omeprazole (PRILOSEC) 40 MG capsule, TAKE ONE CAPSULE BY MOUTH AT BREAKFAST AND AT BEDTIME, Disp: 180 capsule, Rfl: 1   oxyCODONE-acetaminophen (PERCOCET) 5-325 MG tablet, Take 1 tablet by mouth every 12 (twelve) hours as needed for severe pain (pain score 7-10). Must last 30 days., Disp: 60 tablet, Rfl: 0   [START ON 12/03/2023] oxyCODONE-acetaminophen (PERCOCET) 5-325 MG tablet, Take 1 tablet by mouth every 12 (twelve) hours as needed for severe pain (pain score 7-10). Must last 30 days., Disp: 60 tablet, Rfl: 0   [START ON 01/02/2024] oxyCODONE-acetaminophen (PERCOCET) 5-325 MG  tablet, Take 1 tablet by mouth every 12 (twelve) hours as needed for severe pain (pain score 7-10). Must last 30 days., Disp: 60 tablet, Rfl: 0   potassium chloride SA (KLOR-CON M) 20 MEQ tablet, Take 1 tablet (20 mEq total) by mouth every morning., Disp: 90 tablet, Rfl: 1   topiramate (TOPAMAX) 25 MG tablet, Take 1 tablet (25 mg total) by mouth 2 (two) times daily., Disp: 60 tablet, Rfl: 11   valACYclovir (VALTREX) 1000 MG tablet, Take 2 tablets (2,000 mg total) by mouth daily., Disp: 10 tablet, Rfl: 1   venlafaxine XR (EFFEXOR-XR) 75 MG 24 hr capsule, TAKE 2 CAPSULES BY MOUTH DAILY WITH BREAKFAST *DOSE CHANGE*, Disp: 60 capsule, Rfl: 3   XIIDRA 5 % SOLN, Place 1 drop into both eyes daily., Disp: , Rfl:   Social History: Social History   Tobacco Use   Smoking status: Former    Current packs/day: 0.00    Average packs/day: 2.0 packs/day for 45.0 years (90.0 ttl pk-yrs)    Types: Cigarettes    Start date: 10/29/1975    Quit date: 10/28/2020    Years since quitting: 3.0   Smokeless tobacco: Never   Tobacco comments:    Quit smoking in 2022- khj 09/24/2023  Vaping Use   Vaping status: Former   Devices: vape occasionally  Substance Use Topics   Alcohol use: No    Comment: recovering alcoholic Since 1994    Drug use: Never    Family Medical History: Family History  Problem Relation Age of Onset   Alcohol abuse Mother    Bipolar disorder Mother    Suicidality Mother    Arthritis Mother    Early death Mother    Pancreatic cancer Father    Alcohol abuse Father    Cancer Father    Drug abuse Sister    Alcohol abuse Sister    Depression Sister    Alcohol abuse Sister    Anxiety disorder Sister    Anesthesia problems Neg Hx    Breast cancer Neg Hx     Physical Examination: Vitals:   11/08/23 0918  BP: 98/60    General: Patient is in no apparent distress. Attention to examination is appropriate.  Neck:   Supple.  Full range of motion.  Respiratory: Patient is breathing  without any difficulty.   NEUROLOGICAL:     Awake, alert, oriented to person, place, and time.  Speech is clear and fluent.  Cranial Nerves: Pupils equal round and reactive to light.  Facial tone is symmetric.  Facial sensation is symmetric. Shoulder shrug is symmetric. Tongue protrusion is midline.    Strength:  Side Iliopsoas Quads Hamstring PF DF EHL  R 5 5 5 5 5 5   L 5 5 5 5 3 2    Reflexes are split, hyperreflexic on the right and hyperreflexic on the left.  Patchy sensation loss in the bilateral lower extremities  Imaging: CLINICAL DATA: Evaluate for excess motion  EXAM: LUMBAR SPINE - COMPLETE 4+ VIEW  COMPARISON: MRI lumbar spine June 20, 2020, lumbosacral spine August 15, 2019  FINDINGS: Stimulator thoracic spine interest of the L1 level, the tip of the electrodes are T8-T9.  Status post prior laminectomy L4-L5  There is degenerative disc disease with narrowing of the L3-L4 L4-5 L5-S1 disc spaces with grade 1 posterior listhesis of L5 on L4.  Flexion-extension images demonstrates no significant change in the degree of spondylolisthesis  IMPRESSION: *Degenerative disc disease with grade 1 posterior listhesis of L5 on L4. *Stimulator thoracic spine interest of the L1 level, the tip of the electrodes are T8-T9.   Electronically Signed By: Shaaron Adler M.D. On: 11/05/2023 17:22    Narrative & Impression  CLINICAL DATA:  Low back pain with lumbar radiculopathy. Bilateral hip and leg pain.   EXAM: MRI LUMBAR SPINE WITHOUT CONTRAST   TECHNIQUE: Multiplanar, multisequence MR imaging of the lumbar spine was performed. No intravenous contrast was administered.   COMPARISON:  Lumbar spine MRI 06/20/2020.   FINDINGS: Segmentation: Conventional numbering is assumed with 5 non-rib-bearing, lumbar type vertebral bodies.   Alignment:  New grade 1 anterolisthesis of L4 on L5.   Vertebrae: Prior L3-S1 laminectomy. Unchanged chronic,  degenerative height loss of the L5 vertebral body.   Conus medullaris and cauda equina: Conus extends to the L2 level. Conus and cauda equina appear normal.   Paraspinal and other soft tissues: Fatty atrophy of the paraspinal muscles. Spinal cord stimulator enters the dorsal epidural space at T11-12.   Disc levels:   T12-L1: Small disc bulge and mild bilateral facet arthropathy. No spinal canal stenosis or neural foraminal narrowing.   L1-L2: Small disc bulge and mild bilateral facet arthropathy. No spinal canal stenosis or neural foraminal narrowing.   L2-L3: Small disc bulge and mild bilateral facet arthropathy. No spinal canal stenosis or neural foraminal narrowing.   L3-L4: Prior laminectomy. Disc bulge with central annular fissure. Mild bilateral facet arthropathy. No spinal canal stenosis or neural foraminal narrowing.   L4-L5: Anterolisthesis with uncovered disc and superimposed disc bulge. Severe bilateral facet arthropathy with joint effusions, periarticular edema, and medially projecting synovial cyst, measuring up to 12 x 4 mm (image 30 series 6). Severe spinal canal stenosis and severe bilateral neural foraminal narrowing, worse from prior.   L5-S1: Prior laminectomy. Small disc bulge and mild bilateral facet arthropathy. No spinal canal stenosis. Moderate bilateral neural foraminal narrowing.   IMPRESSION: 1. Progression of lower lumbar spondylosis, worst at L4-L5, where there is now severe spinal canal stenosis and severe bilateral neural foraminal narrowing. 2. Moderate bilateral neural foraminal narrowing at L5-S1.     Electronically Signed   By: Orvan Falconer M.D.   On: 11/03/2022 16:47   I have personally reviewed the images and agree with the above interpretation.  In addition the x-ray shows the IPG in the right flank with clear access to leads and battery.  Medical Decision Making/Assessment and Plan: Gabrielle Pineda is a pleasant 68 y.o.  female  with history of chronic back pain.  She has had multilevel lumbar laminectomy as well as a thoracic spinal cord stimulator.  She has been having difficulty with her spinal cord stimulator as far as recharging goes.  She like to switch this out to a none rechargeable product so that she can get more useful stimulation on a more regular basis.  She does feel like she is having some worsening of her claudication on history, she does have severe significant stenosis.  For this we will also recommend starting physical therapy and having her follow-up with [place] to evaluate for any need for repeat decompression or fusion.  As far as the stimulator goes we can plan on replacing her IPG so she can get more regular treatment.  Will work with her to find a time for scheduling.  Thank you for involving me in the care of this patient.    Lovenia Kim MD/MSCR Neurosurgery

## 2023-11-07 NOTE — Progress Notes (Signed)
  Subjective:  Patient ID: Gabrielle Pineda, female    DOB: Jun 30, 1956,  MRN: [id number]  Gabrielle Pineda presents to clinic today for at risk foot care with history of peripheral neuropathy and corn(s) left foot and painful thick toenails that are difficult to trim. Painful toenails interfere with ambulation. Aggravating factors include wearing enclosed shoe gear. Pain is relieved with periodic professional debridement. Painful corns are aggravated when weightbearing when wearing enclosed shoe gear. Pain is relieved with periodic professional debridement.  Chief Complaint  Patient presents with   Nail Problem    Pt is here for [facility] PCP is Dr Payton Mccallum and LOV was in December.   New problem(s): None.   PCP is Sherlyn Hay, DO.  Allergies  Allergen Reactions   Sulfasalazine Other (See Comments)   Plaquenil [Hydroxychloroquine] Rash    Review of Systems: Negative except as noted in the HPI.  Objective: No changes noted in today's physical examination. There were no vitals filed for this visit. Gabrielle Pineda is a pleasant 68 y.o. female WD, WN in NAD. AAO x 3.  Vascular Examination: Capillary refill time immediate b/l. Vascular status intact b/l with palpable pedal pulses. Pedal hair diminished b/l. No pain with calf compression b/l. Skin temperature gradient WNL b/l. No cyanosis or clubbing b/l. No ischemia or gangrene noted b/l.   Neurological Examination: Pt has subjective symptoms of neuropathy.Sensation grossly intact b/l with 10 gram monofilament. Vibratory sensation intact b/l.   Dermatological Examination: Pedal skin with normal turgor, texture and tone b/l.  No open wounds. No interdigital macerations.   Toenails 1-5 b/l thick, discolored, elongated with subungual debris and pain on dorsal palpation.   Hyperkeratotic lesion(s) left third digit.  No erythema, no edema, no drainage, no fluctuance.  Musculoskeletal Examination: Muscle strength 5/5 to all lower  extremity muscle groups bilaterally. HAV with bunion bilaterally and hammertoes 2-5 b/l.  Radiographs: None  Last A1c:      Latest Ref Rng & Units 11/23/2022   10:27 AM  Hemoglobin A1C  Hemoglobin-A1c 4.8 - 5.6 % 5.3     Assessment/Plan: 1. Pain due to onychomycosis of toenails of both feet   2. Clavi   3. Neuropathy   Patient was evaluated and treated. All patient's and/or POA's questions/concerns addressed on today's visit. Toenails 1-5 debrided in length and girth without incident. Corn(s) left third digit pared with sharp debridement without incident. Continue soft, supportive shoe gear daily. Report any pedal injuries to medical professional. Call office if there are any questions/concerns. -Patient/POA to call should there be question/concern in the interim.   Return in about 3 months (around 02/01/2024).  Freddie Breech, DPM       LOCATION: [address] [place] [address]                   Office [phone]   [facility] LOCATION: [address] [place], [place] [address] Office [phone]

## 2023-11-08 ENCOUNTER — Encounter: Payer: Self-pay | Admitting: Neurosurgery

## 2023-11-08 ENCOUNTER — Ambulatory Visit: Admitting: Neurosurgery

## 2023-11-08 VITALS — BP 98/60 | Ht 69.0 in | Wt 168.0 lb

## 2023-11-08 DIAGNOSIS — M961 Postlaminectomy syndrome, not elsewhere classified: Secondary | ICD-10-CM | POA: Diagnosis not present

## 2023-11-08 DIAGNOSIS — Z4542 Encounter for adjustment and management of neuropacemaker (brain) (peripheral nerve) (spinal cord): Secondary | ICD-10-CM | POA: Insufficient documentation

## 2023-11-08 DIAGNOSIS — G894 Chronic pain syndrome: Secondary | ICD-10-CM | POA: Diagnosis not present

## 2023-11-08 DIAGNOSIS — M48062 Spinal stenosis, lumbar region with neurogenic claudication: Secondary | ICD-10-CM

## 2023-11-08 NOTE — Patient Instructions (Signed)
 Is a pleasure seeing you today at [facility] neurosurgery in [place].  We discussed your spinal cord stimulator battery which she would like to have replaced with a nonrechargeable unit.  We are happy to help you with this so you can get back onto more regular utilization.  In regards to your chronic back pain and heaviness in your legs as well as the weakness in your right foot would like you to work with physical therapy and continue to follow-up with [place].  At some point you may benefit from evaluation for a decompression surgery as you have significant stenosis in your lower spine.  Below will be a list of physical therapy practices that you can reach out to to see whether or not they will be able to work with you and your insurance.    Please see below for information in regards to your upcoming surgery:   Planned surgery: revision of internal pulse pulse generator for spinal cord stimulator Conservation officer, historic buildings)   [facility]: [address], [place], [place] [address]) - you will find out your arrival time the business day before your surgery.   Pre-op appointment at [facility] Pre-admit Testing: pre-admit testing will call you with a date/time for this. If you are scheduled for an in person appointment, Pre-admit Testing is located on the first floor of the [facility] building, [address], [address]. Please bring all prescriptions in the original prescription bottles to your appointment. During this appointment, they will advise you which medications you can take the morning of surgery, and which medications you will need to hold for surgery. Labs (such as blood work, EKG) may be done at your pre-op appointment. You are not required to fast for these labs. Should you need to change your pre-op appointment, please call Pre-admit testing at [phone].    How to contact us:  If you have any questions/concerns  before or after surgery, you can reach [place] at [phone], or you can send a mychart message. We can be reached by phone or mychart 8am-4pm, Monday-Friday.  *Please note: Calls after 4pm are forwarded to a third party answering service. Mychart messages are not routinely monitored during evenings, weekends, and holidays. Please call our office to contact the answering service for urgent concerns during non-business hours.    Appointments/FMLA & disability paperwork: Joycelyn Rua, & Flonnie Hailstone Registered Nurse/Surgery scheduler: Royston Cowper Medical Assistants: Nash Mantis Physician Assistants: Joan Flores, PA-C, Manning Charity, PA-C & Drake Leach, PA-C Surgeons: Venetia Night, MD & Ernestine Mcmurray, MD                                     LOCAL PHYSICAL THERAPY  [facility]  [address]  [place], [place] [address]  [phone]   [facility]  [address] [place], [place] [address]  [phone]   [facility] (2 locations)  [address]  [place], [place] [address]  [phone]          or  [address]  [place], [place] [address]  [phone]   [facility]  [address]  [place], [place] [address]  [phone]  **dry needling**   [facility])  [address]  [place], [place] [address]  [phone]  Fax: [phone]  **  Aquatic therapy[address] [place], [place] [address] [phone] **Aquatic therapy**  [facility] [address] [place], [place] [address] [phone]  [facility]  [address]  [place], [place] [address]  [phone]   [facility]  [address]  [place], [place] [address]  [phone]   [facility]  [address]   Janora Norlander  [place], [place] [address]  [phone]    [facility]  [address]  [place], [place] [address]  [phone]  **dry needling**   PELVIC FLOOR/SI JOINT  [facility]  Mariane Masters, PT  [email or web address]   [place]   [facility]  [address]  Suite [place], [place] [address]  [phone]   [facility] [place]  [address] [place] [address]  [phone]   [facility] [address] [place], [place] [address]  Octavio Manns, [place]   [facility]  Raymond Gurney, PT  [address]  [place], [place] [address] [phone]   Samara Deist   [facility]  [address]  [phone]   [facility] Physical Therapy  [address]  [phone]   [facility]rop[facility] Recovery  [facility]  [address]  [place], [place] [address]  [phone]  **No Aetna or medicaid**   [facility]  [phone] [address] [place] [address]  [phone]   [facility]  [address]  [place], [place] [address]  [phone]   Dannial Monarch, [place]  [address] [place], [place] [address]  [phone]   [facility]  [address] [place] [address]  [phone]   [facility]  [address]  [place], [place] [address]  [phone]  [facility]  (3 locations)  [address]  [place], [place] [address]  [phone]  **dry needling**           or  [address] [place], [place] [address]  [phone]  **Additionally has Gloris Manchester, OT**           or  [address]  [place], [place] [address]  [phone]  **Pediatric therapy**  [facility]  [address]  [phone]  **dry needlingVerdie Drown [facility] Therapy  [address]  [place], [place] [address]  [phone]   [facility]   (Inside [address] Fitness)  [address]  [place], [place] [address]  [phone]  **dry needling**  **MEDICAID or UNINSURED** [facility] dept. Of Physical Therapy [place], [place] [address] [phone]  [facility]  [address] [place], [place] [address]  [phone]   [facility]  [address]  [place], [place] [address]  [phone]   [facility]  [address] [place], [place] [address]  [phone]   [facility]  [address]  [place], [place] [address]  [phone]   [facility]  [address] [place], [place] [address]  [phone]   [facility] Therapy  [address]  [place], [place] [address]  [phone]   AQUATIC   Kathalene Frames [facility]   [employer] [facility]  [facility]  [place]   [place]  *Residents only*  [facility] [employer]  *[facility]  Exercise class  [facility]  Exercise class   [facility]  [address]  [place], [place]   [phone]   [facility]  [address]  [place], [place] [address]  [phone]   [place], [place]  [facility]  [address]  [phone]   [facility]  [facility]  [address]  [phone]           or  [address]  [phone]   [facility] Arthritis Support [facility]   Provides education and support and practical information for coping with arthritis for arthritis sufferers and their families.   When: 12:15 - 1:30 p.m. the second Monday of each month, March through December  Info: Call Rehabilitation Services at [phone]

## 2023-11-15 ENCOUNTER — Other Ambulatory Visit: Payer: Self-pay

## 2023-11-15 DIAGNOSIS — Z01818 Encounter for other preprocedural examination: Secondary | ICD-10-CM

## 2023-11-15 NOTE — Addendum Note (Signed)
 Addended by: Barbette Hair on: 11/15/2023 02:47 PM   Modules accepted: Orders

## 2023-11-26 ENCOUNTER — Encounter: Payer: Self-pay | Admitting: Family Medicine

## 2023-11-26 ENCOUNTER — Ambulatory Visit: Payer: Self-pay | Admitting: Family Medicine

## 2023-11-26 VITALS — BP 135/66 | HR 66 | Ht 69.0 in | Wt 173.9 lb

## 2023-11-26 DIAGNOSIS — D509 Iron deficiency anemia, unspecified: Secondary | ICD-10-CM | POA: Diagnosis not present

## 2023-11-26 DIAGNOSIS — G629 Polyneuropathy, unspecified: Secondary | ICD-10-CM

## 2023-11-26 DIAGNOSIS — J310 Chronic rhinitis: Secondary | ICD-10-CM | POA: Insufficient documentation

## 2023-11-26 DIAGNOSIS — Z0000 Encounter for general adult medical examination without abnormal findings: Secondary | ICD-10-CM

## 2023-11-26 DIAGNOSIS — R208 Other disturbances of skin sensation: Secondary | ICD-10-CM

## 2023-11-26 DIAGNOSIS — Z0001 Encounter for general adult medical examination with abnormal findings: Secondary | ICD-10-CM

## 2023-11-26 DIAGNOSIS — I10 Essential (primary) hypertension: Secondary | ICD-10-CM | POA: Diagnosis not present

## 2023-11-26 DIAGNOSIS — F17211 Nicotine dependence, cigarettes, in remission: Secondary | ICD-10-CM

## 2023-11-26 DIAGNOSIS — N1832 Chronic kidney disease, stage 3b: Secondary | ICD-10-CM | POA: Insufficient documentation

## 2023-11-26 NOTE — Progress Notes (Signed)
 Complete physical exam   Patient: Gabrielle Pineda   DOB: July 13, 1956   68 y.o. Female  MRN: [id number] Visit Date: 11/26/2023  Today's healthcare provider: Sherlyn Hay, DO   Chief Complaint  Patient presents with   Annual Exam   Subjective    Gabrielle Pineda is a 68 y.o. female who presents today for a complete physical exam.  She reports consuming a general diet.  She walks daily for about 20 minutes twice daily.  She generally feels well. She reports sleeping well. She does have additional problems to discuss today.    HPI  Gabrielle Pineda is a 68 year old female who presents for an annual physical exam.  She has been experiencing a persistent runny nose for over a year, which has not improved with the use of ipratropium nasal spray, azelastine, and fluticasone. These medications have been causing headaches, and the Topamax she takes for migraines is not alleviating these headaches.  She denies use of Afrin.  She has a history of wheezing and uses Breztri inhaler daily, two puffs twice a day, and albuterol as needed, though not frequently. She also has a nebulizer at home for additional treatments. No recent episodes of supraventricular tachycardia, chest pain, shortness of breath, dizziness, or lightheadedness.  She has a history of neuropathy affecting her feet and hands, for which she uses oxycodone (Percocet) as needed. She also takes meloxicam, prescribed by a rheumatologist, and has been on potassium supplements for several years.  She experiences a burning sensation on her tongue when eating foods that are not typically spicy, which has been occurring for a couple of months. She follows a normal diet and engages in walking for about 20 minutes twice a day.  She has a history of elevated ferritin levels and was advised to follow up with hematology. She denies alcohol consumption, which can affect ferritin levels.  She has a family history of alcohol abuse, with  both parents and a sister who were alcoholics. She herself is a recovering alcoholic. She reports no falls in the past year and no issues with balance.  Spinal cord stimulator replacement scheduled for 01/04/2024    Past Medical History:  Diagnosis Date   Alcohol abuse    Allergy 1983   Anemia    Anxiety    Arthritis    Cataract    Cervicalgia    Cirrhosis (HCC) 1994   COPD (chronic obstructive pulmonary disease) (HCC)    wears O2 at night   Depression    Difficult intubation    has plates and screws in neck   Dyspnea    GERD (gastroesophageal reflux disease)    Headache    Heart murmur    on heard when pt is lying   Hypertension    Neuromuscular disorder (HCC)    Neuropathy   Other and unspecified hyperlipidemia    Oxygen deficiency    Status post insertion of spinal cord stimulator    Tachycardia    d/t questionable anxiety happens every 5- 10 years, sees [facility]   Past Surgical History:  Procedure Laterality Date   ANTERIOR CERVICAL DECOMP/DISCECTOMY FUSION  2012, 2015, 2018   x3   AUGMENTATION MAMMAPLASTY Bilateral 1982   BREAST ENHANCEMENT SURGERY  1981   CARPAL TUNNEL RELEASE  11/11/2011   Procedure: CARPAL TUNNEL RELEASE;  Surgeon: [name], MD;  Location: [facility];  Service: Neurosurgery;  Laterality: Right;  Right Median Nerve Decompression  CARPAL TUNNEL RELEASE  2013   COLONOSCOPY WITH PROPOFOL N/A 11/01/2017   Procedure: COLONOSCOPY WITH PROPOFOL;  Surgeon: [name], MD;  Location: [facility] ENDOSCOPY;  Service: Endoscopy;  Laterality: N/A;   COLONOSCOPY WITH PROPOFOL N/A 08/09/2023   Procedure: COLONOSCOPY WITH PROPOFOL;  Surgeon: Jaynie Collins, DO;  Location: [facility] ENDOSCOPY;  Service: Gastroenterology;  Laterality: N/A;   ESOPHAGOGASTRODUODENOSCOPY     ESOPHAGOGASTRODUODENOSCOPY (EGD) WITH PROPOFOL N/A 08/09/2023   Procedure: ESOPHAGOGASTRODUODENOSCOPY (EGD) WITH PROPOFOL;  Surgeon: Jaynie Collins, DO;   Location: [facility] ENDOSCOPY;  Service: Gastroenterology;  Laterality: N/A;   JOINT REPLACEMENT  2022   LUMBAR LAMINECTOMY/DECOMPRESSION MICRODISCECTOMY N/A 08/15/2019   Procedure: Lumbar three to Sacral one Decompressive lumbar laminectomy;  Surgeon: [name], MD;  Location: [facility] OR;  Service: Neurosurgery;  Laterality: N/A;   neck disc surgery     plates and screws in neck x 2   POLYPECTOMY  08/09/2023   Procedure: POLYPECTOMY;  Surgeon: Jaynie Collins, DO;  Location: [facility] ENDOSCOPY;  Service: Gastroenterology;;   ROTATOR CUFF REPAIR  06/16/2016   right shoulder    ROTATOR CUFF REPAIR     SPINAL CORD STIMULATOR INSERTION N/A 03/17/2021   Procedure: THORACIC SPINAL CORD STIMULATOR (PERCUTANEOUS) & PULSE GENERATOR PLACEMENT;  Surgeon: [name], MD;  Location: [facility] ORS;  Service: Neurosurgery;  Laterality: N/A;   SPINE SURGERY  2020   TONSILLECTOMY AND ADENOIDECTOMY  1973   TOTAL HIP ARTHROPLASTY Right 04/15/2021   Procedure: TOTAL HIP ARTHROPLASTY ANTERIOR APPROACH;  Surgeon: [name], MD;  Location: [facility] ORS;  Service: Orthopedics;  Laterality: Right;   Social History   Socioeconomic History   Marital status: Married    Spouse name: jerry   Number of children: 0   Years of education: Not on file   Highest education level: Bachelor's degree (e.g., BA, AB, BS)  Occupational History   Not on file  Tobacco Use   Smoking status: Former    Current packs/day: 0.00    Average packs/day: 2.0 packs/day for 45.0 years (90.0 ttl pk-yrs)    Types: Cigarettes    Start date: 10/29/1975    Quit date: 10/28/2020    Years since quitting: 3.0   Smokeless tobacco: Never   Tobacco comments:    Quit smoking in 2022- khj 09/24/2023  Vaping Use   Vaping status: Former   Devices: vape occasionally  Substance and Sexual Activity   Alcohol use: No    Comment: recovering alcoholic Since 1994    Drug use: Never   Sexual activity: Not Currently    Birth control/protection: Abstinence   Other Topics Concern   Not on file  Social History Narrative   Married; full time; does not get regular exercise.    Social Drivers of Corporate investment banker Strain: Low Risk  (10/06/2023)   Overall Financial Resource Strain (CARDIA)    Difficulty of Paying Living Expenses: Not hard at all  Recent Concern: Financial Resource Strain - Medium Risk (07/17/2023)   Overall Financial Resource Strain (CARDIA)    Difficulty of Paying Living Expenses: Somewhat hard  Food Insecurity: No Food Insecurity (10/06/2023)   Hunger Vital Sign    Worried About Running Out of Food in the Last Year: Never true    Ran Out of Food in the Last Year: Never true  Transportation Needs: No Transportation Needs (10/06/2023)   PRAPARE - Administrator, Civil Service (Medical): No    Lack of Transportation (Non-Medical): No  Physical  Activity: Inactive (10/06/2023)   Exercise Vital Sign    Days of Exercise per Week: 0 days    Minutes of Exercise per Session: 30 min  Stress: No Stress Concern Present (10/06/2023)   [employer] of Occupational Health - Occupational Stress Questionnaire    Feeling of Stress : Not at all  Social Connections: Moderately Isolated (10/06/2023)   Social Connection and Isolation Panel [NHANES]    Frequency of Communication with Friends and Family: More than three times a week    Frequency of Social Gatherings with Friends and Family: More than three times a week    Attends Religious Services: Never    Database administrator or Organizations: No    Attends Banker Meetings: Not on file    Marital Status: Married  Intimate Partner Violence: Not At Risk (11/09/2022)   Humiliation, Afraid, Rape, and Kick questionnaire    Fear of Current or Ex-Partner: No    Emotionally Abused: No    Physically Abused: No    Sexually Abused: No   Family Status  Relation Name Status   Mother [place] Marlette Zufall Deceased at age 56       suicide, drug OD    Father  Idamae Schuller Deceased at age 21   Sister Deborah Zufall Deceased   Sister  Alive   Sister  Alive   Sister Dierdre Forth Zufall (Not Specified)   Neg Hx  (Not Specified)  No partnership data on file   Family History  Problem Relation Age of Onset   Alcohol abuse Mother    Bipolar disorder Mother    Suicidality Mother    Arthritis Mother    Early death Mother    Pancreatic cancer Father    Alcohol abuse Father    Cancer Father    Drug abuse Sister    Alcohol abuse Sister    Depression Sister    Alcohol abuse Sister    Anxiety disorder Sister    Anesthesia problems Neg Hx    Breast cancer Neg Hx    Allergies  Allergen Reactions   Sulfasalazine Other (See Comments)   Plaquenil [Hydroxychloroquine] Rash    Patient Care Team: Clavin Ruhlman, Monico Blitz, DO as PCP - General (Family Medicine) Mariah Milling, Tollie Pizza, MD as PCP - Cardiology (Cardiology) Jomarie Longs, MD as Consulting Physician (Psychiatry) Edward Jolly, MD as Consulting Physician (Pain Medicine) Ronney Asters, Jackelyn Poling, RPH-CPP as Pharmacist   Medications: Outpatient Medications Prior to Visit  Medication Sig   acyclovir ointment (ZOVIRAX) 5 % :Topical Every 3 Hours as needed for lesion   albuterol (VENTOLIN HFA) 108 (90 Base) MCG/ACT inhaler INHALE TWO (2) PUFFS BY MOUTH AND INTO THE LUNGS EVERY 6 HOURS AS NEEDED FOR SHORTNESS OF BREATH   Atogepant 60 MG TABS Take by mouth.   atorvastatin (LIPITOR) 40 MG tablet Take 1 tablet (40 mg total) by mouth daily.   azelastine (ASTELIN) 0.1 % nasal spray Place 2 sprays into both nostrils 2 (two) times daily. Use in each nostril as directed   Budeson-Glycopyrrol-Formoterol (BREZTRI AEROSPHERE) 160-9-4.8 MCG/ACT AERO Inhale 2 puffs into the lungs in the morning and at bedtime.   busPIRone (BUSPAR) 7.5 MG tablet Take 1 tablet (7.5 mg total) by mouth 2 (two) times daily. Hold for now   carvedilol (COREG) 12.5 MG tablet TAKE ONE TABLET BY MOUTH AT BREAKFAST AND AT BEDTIME   ezetimibe  (ZETIA) 10 MG tablet Take 1 tablet (10 mg total) by mouth daily.   fexofenadine (  ALLEGRA) 180 MG tablet Take 180 mg by mouth daily.   fluticasone (FLONASE) 50 MCG/ACT nasal spray Place 2 sprays into both nostrils in the morning and at bedtime.   furosemide (LASIX) 20 MG tablet TAKE 1 TABLET BY MOUTH ONCE DAILY   gabapentin (NEURONTIN) 400 MG capsule Take 1 capsule (400 mg total) by mouth at bedtime.   hydrOXYzine (ATARAX) 10 MG tablet TAKE 1-2 TABLETS BY MOUTH TWICE DAILY AS NEEDED FOR SEVERE ANXIETY ATTACKS ONLY   ipratropium (ATROVENT) 0.03 % nasal spray Place 2 sprays into both nostrils 3 (three) times daily.   leflunomide (ARAVA) 20 MG tablet Take 20 mg by mouth at bedtime.   meloxicam (MOBIC) 15 MG tablet Take 15 mg by mouth at bedtime.   mirtazapine (REMERON) 15 MG tablet TAKE 1 TABLET BY MOUTH EVERY DAY AT BEDTIME   montelukast (SINGULAIR) 10 MG tablet TAKE 1 TABLET BY MOUTH EVERY DAY AT BEDTIME   Multiple Vitamin (MULTIVITAMIN WITH MINERALS) TABS tablet Take 1 tablet by mouth daily.   Nebulizer MISC Compressor nebulizer to use with nebulized medications as instructed   oxyCODONE-acetaminophen (PERCOCET) 5-325 MG tablet Take 1 tablet by mouth every 12 (twelve) hours as needed for severe pain (pain score 7-10). Must last 30 days.   [START ON 12/03/2023] oxyCODONE-acetaminophen (PERCOCET) 5-325 MG tablet Take 1 tablet by mouth every 12 (twelve) hours as needed for severe pain (pain score 7-10). Must last 30 days.   [START ON 01/02/2024] oxyCODONE-acetaminophen (PERCOCET) 5-325 MG tablet Take 1 tablet by mouth every 12 (twelve) hours as needed for severe pain (pain score 7-10). Must last 30 days.   potassium chloride SA (KLOR-CON M) 20 MEQ tablet Take 1 tablet (20 mEq total) by mouth every morning.   topiramate (TOPAMAX) 25 MG tablet Take 1 tablet (25 mg total) by mouth 2 (two) times daily.   valACYclovir (VALTREX) 1000 MG tablet Take 2 tablets (2,000 mg total) by mouth daily.   venlafaxine XR  (EFFEXOR-XR) 75 MG 24 hr capsule TAKE 2 CAPSULES BY MOUTH DAILY WITH BREAKFAST *DOSE CHANGE*   XIIDRA 5 % SOLN Place 1 drop into both eyes daily.   [DISCONTINUED] omeprazole (PRILOSEC) 40 MG capsule TAKE ONE CAPSULE BY MOUTH AT BREAKFAST AND AT BEDTIME   No facility-administered medications prior to visit.    Review of Systems  Constitutional:  Negative for chills, fatigue and fever.  HENT:  Negative for congestion, ear pain, rhinorrhea, sneezing and sore throat.   Eyes: Negative.  Negative for pain and redness.  Respiratory:  Negative for cough, shortness of breath and wheezing.   Cardiovascular:  Negative for chest pain and leg swelling.  Gastrointestinal:  Negative for abdominal pain, blood in stool, constipation, diarrhea and nausea.  Endocrine: Negative for polydipsia and polyphagia.  Genitourinary: Negative.  Negative for dysuria, flank pain, hematuria, pelvic pain, vaginal bleeding and vaginal discharge.  Musculoskeletal:  Positive for arthralgias and back pain. Negative for gait problem and joint swelling.  Skin:  Negative for rash.  Neurological:  Positive for headaches. Negative for dizziness, tremors, seizures, weakness, light-headedness and numbness.  Hematological:  Negative for adenopathy.  Psychiatric/Behavioral: Negative.  Negative for behavioral problems, confusion and dysphoric mood. The patient is not nervous/anxious and is not hyperactive.       Objective    BP 135/66 (BP Location: Left Arm, Patient Position: Sitting, Cuff Size: Normal)   Pulse 66   Ht 5\' 9"  (1.753 m)   Wt 173 lb 14.4 oz (78.9 kg)   SpO2  98%   BMI 25.68 kg/m    Physical Exam Vitals and nursing note reviewed.  Constitutional:      General: She is awake.     Appearance: Normal appearance.  HENT:     Head: Normocephalic and atraumatic.     Right Ear: Tympanic membrane, ear canal and external ear normal.     Left Ear: Ear canal and external ear normal. A middle ear effusion (small amount of  milky-clear effusion) is present.     Nose: Nose normal.     Mouth/Throat:     Mouth: Mucous membranes are moist.     Pharynx: Oropharynx is clear. No oropharyngeal exudate or posterior oropharyngeal erythema.  Eyes:     General: No scleral icterus.    Extraocular Movements: Extraocular movements intact.     Conjunctiva/sclera: Conjunctivae normal.     Pupils: Pupils are equal, round, and reactive to light.  Neck:     Thyroid: No thyromegaly or thyroid tenderness.  Cardiovascular:     Rate and Rhythm: Normal rate and regular rhythm.     Pulses: Normal pulses.     Heart sounds: Normal heart sounds.  Pulmonary:     Effort: Pulmonary effort is normal. No tachypnea, bradypnea or respiratory distress.     Breath sounds: Normal breath sounds. No stridor. No wheezing, rhonchi or rales.  Abdominal:     General: Bowel sounds are normal. There is no distension.     Palpations: Abdomen is soft. There is no mass.     Tenderness: There is no abdominal tenderness. There is no guarding.     Hernia: No hernia is present.  Musculoskeletal:     Cervical back: Normal range of motion and neck supple.     Right lower leg: No edema.     Left lower leg: No edema.  Lymphadenopathy:     Cervical: No cervical adenopathy.  Skin:    General: Skin is warm and dry.  Neurological:     Mental Status: She is alert and oriented to person, place, and time. Mental status is at baseline.  Psychiatric:        Mood and Affect: Mood normal.        Behavior: Behavior normal.      Last depression screening scores    11/26/2023    9:35 AM 10/19/2023    3:35 PM 09/28/2023    4:40 PM  PHQ 2/9 Scores  PHQ - 2 Score 0    PHQ- 9 Score 0       Information is confidential and restricted. Go to Review Flowsheets to unlock data.   Last fall risk screening    11/26/2023    9:56 AM  Fall Risk   Falls in the past year? 0  Number falls in past yr: 0  Injury with Fall? 0  Risk for fall due to : No Fall Risks   Last  Audit-C alcohol use screening    11/26/2023    9:56 AM  Alcohol Use Disorder Test (AUDIT)  1. How often do you have a drink containing alcohol? 0  2. How many drinks containing alcohol do you have on a typical day when you are drinking? 0  3. How often do you have six or more drinks on one occasion? 0  AUDIT-C Score 0   A score of 3 or more in women, and 4 or more in men indicates increased risk for alcohol abuse, EXCEPT if all of the points are from question 1  No results found for any visits on 11/26/23.  Assessment & Plan    Routine Health Maintenance and Physical Exam  Exercise Activities and Dietary recommendations  Goals      DIET - EAT MORE FRUITS AND VEGETABLES     Pharmacy Goals     If you need to reach out to patient assistance programs regarding refills or to find out the status of your application, you can do so by calling:  For [place], [employer] at [phone] For [place], [place] at [phone]  Check your blood pressure once-twice weekly, and any time you have concerning symptoms like headache, chest pain, dizziness, shortness of breath, or vision changes.   Our goal is less than 130/80.  To appropriately check your blood pressure, make sure you do the following:  1) Avoid caffeine, exercise, or tobacco products for 30 minutes before checking. Empty your bladder. 2) Sit with your back supported in a flat-backed chair. Rest your arm on something flat (arm of the chair, table, etc). 3) Sit still with your feet flat on the floor, resting, for at least 5 minutes.  4) Check your blood pressure. Take 1-2 readings.  5) Write down these readings and bring with you to any provider appointments.  Bring your home blood pressure machine with you to a provider's office for accuracy comparison at least once a year.   Make sure you take your blood pressure medications before you come to any office visit, even if you were asked to fast for labs.   Estelle Grumbles, PharmD,  [facility] [phone]       Track and Manage My Triggers-COPD     Timeframe:  Long-Range Goal Priority:  High Start Date: 11/25/2020                            Expected End Date:  05/28/2023                     Follow Up within 30 days   - avoid second hand smoke - eliminate smoking in my home - identify and remove indoor air pollutants - limit outdoor activity during cold weather    Why is this important?   Triggers are activities or things, like tobacco smoke or cold weather, that make your COPD (chronic obstructive pulmonary disease) flare-up.  Knowing these triggers helps you plan how to stay away from them.  When you cannot remove them, you can learn how to manage them.     Notes:         Immunization History  Administered Date(s) Administered   Fluad Quad(high Dose 65+) 10/01/2021, 05/14/2022   Fluad Trivalent(High Dose 65+) 08/25/2023   Influenza Split 09/07/2012   Influenza,inj,Quad PF,6+ Mos 11/18/2015, 07/23/2016, 07/02/2017, 04/14/2018, 04/17/2019, 04/19/2020   Influenza-Unspecified 09/24/2014   PFIZER(Purple Top)SARS-COV-2 Vaccination 11/21/2019, 12/12/2019   PNEUMOCOCCAL CONJUGATE-20 11/17/2021   Pneumococcal Polysaccharide-23 02/12/2011, 09/24/2014, 04/19/2020   Tdap 02/12/2011, 11/15/2015    Health Maintenance  Topic Date Due   Lung Cancer Screening  06/30/2023   Medicare Annual Wellness (AWV)  02/15/2024 (Originally 11/09/2023)   Zoster Vaccines- Shingrix (1 of 2) 02/25/2024 (Originally 08[birth year]10/1975)   COVID-19 Vaccine (3 - Pfizer risk series) 04/17/2024 (Originally 01/09/2020)   MAMMOGRAM  02/03/2024   INFLUENZA VACCINE  03/17/2024   Colonoscopy  08/08/2024   DTaP/Tdap/Td (3 - Td or Tdap) 11/14/2025   Pneumonia Vaccine 69+ Years old  Completed   DEXA SCAN  Completed   Hepatitis C Screening  Completed   HPV VACCINES  Aged Out   Meningococcal B Vaccine  Aged Out    Discussed health benefits of physical activity, and  encouraged her to engage in regular exercise appropriate for her age and condition.   Annual physical exam  Essential hypertension  Neuropathy  Iron deficiency anemia, unspecified iron deficiency anemia type  Chronic rhinitis -     Ambulatory referral to ENT  Chronic kidney disease, stage 3b (HCC) -     VITAMIN D 25 Hydroxy (Vit-D Deficiency, Fractures) -     Renal function panel  Nicotine dependence, cigarettes, in remission  Burning sensation of mouth -     Ambulatory referral to ENT     Annual physical exam Physical exam overall unremarkable except as noted above. Routine lab work ordered as noted.  Due for annual wellness visit. Engages in regular physical activity. - Currently scheduled 03/10/2023 for annual wellness visit.  Nicotine dependence in remission Gave patient the information to contact the [place] pulmonary clinic to schedule her low-dose lung cancer screening CT.  Chronic Rhinitis Chronic rhinitis unresponsive to current nasal sprays, causing headaches. Topamax ineffective for headache relief. ENT evaluation required. - Send referral to ENT for evaluation.  Burning Sensation in Tongue Burning sensation in tongue with non-spicy foods for months. - Send referral to ENT for further evaluation.  Wheezing and Asthma Management Wheezing managed with Breztri and occasional albuterol.  Patient reports consistent use of her Breztri as directed, with infrequent use of albuterol.  Nebulizer available for wheezing. - Advise nebulizer treatment before Breztri if particularly wheezy/short of breath in the morning to ensure adequate inhalation of Breo strength.  Chronic Back Pain Scheduled for spinal cord stimulator battery replacement surgery on May 20th.  Peripheral Neuropathy Neuropathy in feet and hands managed with oxycodone (Percocet) through pain management.  Elevated Ferritin Levels Elevated ferritin levels with no alcohol consumption. Hematology follow-up  advised. - Follow up with hematology.  Chronic kidney disease stage 3b - Will check renal function panel today and evaluate likelihood of progression.  If elevated, will refer to nephrology.   Follow-up Following up with pulmonology and gastroenterology. Renal function panel planned. - Schedule follow-up in six months. - Monitor kidney function and refer to nephrology if results are abnormal or likely to progress.  Return in about 6 months (around 05/27/2024) for Chronic f/u.     I discussed the assessment and treatment plan with the patient  The patient was provided an opportunity to ask questions and all were answered. The patient agreed with the plan and demonstrated an understanding of the instructions.   The patient was advised to call back or seek an in-person evaluation if the symptoms worsen or if the condition fails to improve as anticipated.    Sherlyn Hay, DO  [facility] [phone] (phone) [phone] (fax)  [facility]

## 2023-11-26 NOTE — Patient Instructions (Addendum)
 [facility] - PULMONARY CARE  for lung cancer screening [address] [place] [address] [phone]

## 2023-11-27 LAB — RENAL FUNCTION PANEL
Albumin: 4.2 g/dL (ref 3.9–4.9)
BUN/Creatinine Ratio: 13 (ref 12–28)
BUN: 17 mg/dL (ref 8–27)
CO2: 21 mmol/L (ref 20–29)
Calcium: 9.4 mg/dL (ref 8.7–10.3)
Chloride: 104 mmol/L (ref 96–106)
Creatinine, Ser: 1.27 mg/dL — ABNORMAL HIGH (ref 0.57–1.00)
Glucose: 104 mg/dL — ABNORMAL HIGH (ref 70–99)
Phosphorus: 3.9 mg/dL (ref 3.0–4.3)
Potassium: 4.8 mmol/L (ref 3.5–5.2)
Sodium: 139 mmol/L (ref 134–144)
eGFR: 46 mL/min/{1.73_m2} — ABNORMAL LOW (ref >59)

## 2023-11-27 LAB — VITAMIN D 25 HYDROXY (VIT D DEFICIENCY, FRACTURES): Vit D, 25-Hydroxy: 27.4 ng/mL — ABNORMAL LOW (ref 30.0–100.0)

## 2023-12-01 ENCOUNTER — Other Ambulatory Visit: Payer: Self-pay | Admitting: Family Medicine

## 2023-12-01 DIAGNOSIS — B001 Herpesviral vesicular dermatitis: Secondary | ICD-10-CM

## 2023-12-02 ENCOUNTER — Encounter: Payer: Self-pay | Admitting: Family Medicine

## 2023-12-05 ENCOUNTER — Encounter: Payer: Self-pay | Admitting: Family Medicine

## 2023-12-14 ENCOUNTER — Encounter: Payer: Self-pay | Admitting: Psychiatry

## 2023-12-14 ENCOUNTER — Telehealth: Admitting: Psychiatry

## 2023-12-14 DIAGNOSIS — F411 Generalized anxiety disorder: Secondary | ICD-10-CM

## 2023-12-14 DIAGNOSIS — F1021 Alcohol dependence, in remission: Secondary | ICD-10-CM

## 2023-12-14 DIAGNOSIS — F5101 Primary insomnia: Secondary | ICD-10-CM

## 2023-12-14 DIAGNOSIS — F3342 Major depressive disorder, recurrent, in full remission: Secondary | ICD-10-CM

## 2023-12-14 NOTE — Progress Notes (Signed)
 Virtual Visit via Video Note  I connected with Gabrielle Pineda on 12/14/23 at 10:30 AM EDT by a video enabled telemedicine application and verified that I am speaking with the correct person using two identifiers.  Location Provider Location : [facility] Patient Location : Home  Participants: Patient , Provider    I discussed the limitations of evaluation and management by telemedicine and the availability of in person appointments. The patient expressed understanding and agreed to proceed.   I discussed the assessment and treatment plan with the patient. The patient was provided an opportunity to ask questions and all were answered. The patient agreed with the plan and demonstrated an understanding of the instructions.   The patient was advised to call back or seek an in-person evaluation if the symptoms worsen or if the condition fails to improve as anticipated.   [facility] MD OP Progress Note  12/14/2023 10:54 AM Gabrielle Pineda  MRN:  [id number]  Chief Complaint:  Chief Complaint  Patient presents with   Follow-up   Depression   Anxiety   Medication Refill   Discussed the use of AI scribe software for clinical note transcription with the patient, who gave verbal consent to proceed.  History of Present Illness Gabrielle Pineda is a 68 year old Caucasian female, disability, lives in [place], has a history of MDD, GAD, insomnia, alcoholism in remission, history of back pain, history of spinal cord stimulator, paroxysmal ventricular tachycardia, hypercholesterolemia, chronic pain syndrome, GERD was evaluated by telemedicine today.  She is currently stable with no significant anxiety, sadness, or hopelessness. She reports no panic attacks and is sleeping well.  Her current medications include venlafaxine  150 mg daily, mirtazapine  15 mg daily, and gabapentin . She is not taking buspirone  due to previous headaches. There are no new changes to her medications.  Denies thoughts of self-harm  or harm to others.  Denies any other concerns today.    Visit Diagnosis:    ICD-10-CM   1. Primary insomnia  F51.01     2. MDD (major depressive disorder), recurrent, in full remission (HCC)  F33.42     3. GAD (generalized anxiety disorder)  F41.1     4. Alcohol use disorder, moderate, in sustained remission (HCC)  F10.21       Past Psychiatric History: I have reviewed past psychiatric history from progress note on 12/26/2021.  Trials of medications like sertraline , multiple other medications-does not remember all the names.  One suicide attempt when she was a teenager-overdosed on aspirin.  Past Medical History:  Past Medical History:  Diagnosis Date   Alcohol abuse    Allergy 1983   Anemia    Anxiety    Arthritis    Cataract    Cervicalgia    Cirrhosis (HCC) 1994   COPD (chronic obstructive pulmonary disease) (HCC)    wears O2 at night   Depression    Difficult intubation    has plates and screws in neck   Dyspnea    GERD (gastroesophageal reflux disease)    Headache    Heart murmur    on heard when pt is lying   Hypertension    Neuromuscular disorder (HCC)    Neuropathy   Other and unspecified hyperlipidemia    Oxygen  deficiency    Status post insertion of spinal cord stimulator    Tachycardia    d/t questionable anxiety happens every 5- 10 years, sees [facility]    Past Surgical History:  Procedure Laterality Date  ANTERIOR CERVICAL DECOMP/DISCECTOMY FUSION  2012, 2015, 2018   x3   AUGMENTATION MAMMAPLASTY Bilateral 1982   BREAST ENHANCEMENT SURGERY  1981   CARPAL TUNNEL RELEASE  11/11/2011   Procedure: CARPAL TUNNEL RELEASE;  Surgeon: Adelbert Adler, MD;  Location: [facility];  Service: Neurosurgery;  Laterality: Right;  Right Median Nerve Decompression   CARPAL TUNNEL RELEASE  2013   COLONOSCOPY WITH PROPOFOL  N/A 11/01/2017   Procedure: COLONOSCOPY WITH PROPOFOL ;  Surgeon: Cassie Click, MD;  Location: [facility] ENDOSCOPY;  Service:  Endoscopy;  Laterality: N/A;   COLONOSCOPY WITH PROPOFOL  N/A 08/09/2023   Procedure: COLONOSCOPY WITH PROPOFOL ;  Surgeon: Quintin Buckle, DO;  Location: [facility] ENDOSCOPY;  Service: Gastroenterology;  Laterality: N/A;   ESOPHAGOGASTRODUODENOSCOPY     ESOPHAGOGASTRODUODENOSCOPY (EGD) WITH PROPOFOL  N/A 08/09/2023   Procedure: ESOPHAGOGASTRODUODENOSCOPY (EGD) WITH PROPOFOL ;  Surgeon: Quintin Buckle, DO;  Location: [facility] ENDOSCOPY;  Service: Gastroenterology;  Laterality: N/A;   JOINT REPLACEMENT  2022   LUMBAR LAMINECTOMY/DECOMPRESSION MICRODISCECTOMY N/A 08/15/2019   Procedure: Lumbar three to Sacral one Decompressive lumbar laminectomy;  Surgeon: Manya Sells, MD;  Location: [facility] OR;  Service: Neurosurgery;  Laterality: N/A;   neck disc surgery     plates and screws in neck x 2   POLYPECTOMY  08/09/2023   Procedure: POLYPECTOMY;  Surgeon: Quintin Buckle, DO;  Location: [facility] ENDOSCOPY;  Service: Gastroenterology;;   ROTATOR CUFF REPAIR  06/16/2016   right shoulder    ROTATOR CUFF REPAIR     SPINAL CORD STIMULATOR INSERTION N/A 03/17/2021   Procedure: THORACIC SPINAL CORD STIMULATOR (PERCUTANEOUS) & PULSE GENERATOR PLACEMENT;  Surgeon: Berta Brittle, MD;  Location: [facility] ORS;  Service: Neurosurgery;  Laterality: N/A;   SPINE SURGERY  2020   TONSILLECTOMY AND ADENOIDECTOMY  1973   TOTAL HIP ARTHROPLASTY Right 04/15/2021   Procedure: TOTAL HIP ARTHROPLASTY ANTERIOR APPROACH;  Surgeon: Molli Angelucci, MD;  Location: [facility] ORS;  Service: Orthopedics;  Laterality: Right;    Family Psychiatric History: I have reviewed family psychiatric history from progress note on 12/26/2021.  Family History:  Family History  Problem Relation Age of Onset   Alcohol abuse Mother    Bipolar disorder Mother    Suicidality Mother    Arthritis Mother    Early death Mother    Pancreatic cancer Father    Alcohol abuse Father    Cancer Father    Drug abuse Sister    Alcohol abuse Sister     Depression Sister    Alcohol abuse Sister    Anxiety disorder Sister    Anesthesia problems Neg Hx    Breast cancer Neg Hx     Social History: I have reviewed social history from progress note on 12/26/2021. Social History   Socioeconomic History   Marital status: Married    Spouse name: jerry   Number of children: 0   Years of education: Not on file   Highest education level: Bachelor's degree (e.g., BA, AB, BS)  Occupational History   Not on file  Tobacco Use   Smoking status: Former    Current packs/day: 0.00    Average packs/day: 2.0 packs/day for 45.0 years (90.0 ttl pk-yrs)    Types: Cigarettes    Start date: 10/29/1975    Quit date: 10/28/2020    Years since quitting: 3.1   Smokeless tobacco: Never   Tobacco comments:    Quit smoking in 2022- khj 09/24/2023  Vaping Use   Vaping status: Former   Devices: vape occasionally  Substance and Sexual Activity   Alcohol use: No    Comment: recovering alcoholic Since 1994    Drug use: Never   Sexual activity: Not Currently    Birth control/protection: Abstinence  Other Topics Concern   Not on file  Social History Narrative   Married; full time; does not get regular exercise.    Social Drivers of Corporate investment banker Strain: Low Risk  (10/06/2023)   Overall Financial Resource Strain (CARDIA)    Difficulty of Paying Living Expenses: Not hard at all  Recent Concern: Financial Resource Strain - Medium Risk (07/17/2023)   Overall Financial Resource Strain (CARDIA)    Difficulty of Paying Living Expenses: Somewhat hard  Food Insecurity: No Food Insecurity (10/06/2023)   Hunger Vital Sign    Worried About Running Out of Food in the Last Year: Never true    Ran Out of Food in the Last Year: Never true  Transportation Needs: No Transportation Needs (10/06/2023)   PRAPARE - Administrator, Civil Service (Medical): No    Lack of Transportation (Non-Medical): No  Physical Activity: Unknown (10/06/2023)   Exercise  Vital Sign    Days of Exercise per Week: 0 days    Minutes of Exercise per Session: Not on file  Recent Concern: Physical Activity - Inactive (10/06/2023)   Exercise Vital Sign    Days of Exercise per Week: 0 days    Minutes of Exercise per Session: 30 min  Stress: No Stress Concern Present (10/06/2023)   [employer] of Occupational Health - Occupational Stress Questionnaire    Feeling of Stress : Not at all  Social Connections: Moderately Isolated (10/06/2023)   Social Connection and Isolation Panel [NHANES]    Frequency of Communication with Friends and Family: More than three times a week    Frequency of Social Gatherings with Friends and Family: More than three times a week    Attends Religious Services: Never    Database administrator or Organizations: No    Attends Engineer, structural: Not on file    Marital Status: Married    Allergies:  Allergies  Allergen Reactions   Sulfasalazine Other (See Comments)   Plaquenil [Hydroxychloroquine] Rash    Metabolic Disorder Labs: Lab Results  Component Value Date   HGBA1C 5.3 11/23/2022   No results found for: "PROLACTIN" Lab Results  Component Value Date   CHOL 184 07/19/2023   TRIG 156 (H) 07/19/2023   HDL 60 07/19/2023   CHOLHDL 3.1 07/19/2023   LDLCALC 97 07/19/2023   LDLCALC 78 11/23/2022   Lab Results  Component Value Date   TSH 3.673 03/10/2022   TSH 2.170 11/17/2021    Therapeutic Level Labs: No results found for: "LITHIUM" No results found for: "VALPROATE" No results found for: "CBMZ"  Current Medications: Current Outpatient Medications  Medication Sig Dispense Refill   acyclovir  ointment (ZOVIRAX ) 5 % :Topical Every 3 Hours as needed for lesion 30 g 1   albuterol  (VENTOLIN  HFA) 108 (90 Base) MCG/ACT inhaler INHALE TWO (2) PUFFS BY MOUTH AND INTO THE LUNGS EVERY 6 HOURS AS NEEDED FOR SHORTNESS OF BREATH 8.5 g 10   Atogepant 60 MG TABS Take by mouth.     atorvastatin  (LIPITOR) 40 MG tablet  Take 1 tablet (40 mg total) by mouth daily. 90 tablet 3   azelastine  (ASTELIN ) 0.1 % nasal spray Place 2 sprays into both nostrils 2 (two) times daily. Use in each nostril as directed 30 mL  1   Budeson-Glycopyrrol-Formoterol (BREZTRI  AEROSPHERE) 160-9-4.8 MCG/ACT AERO Inhale 2 puffs into the lungs in the morning and at bedtime. 3 each 3   carvedilol  (COREG ) 12.5 MG tablet TAKE ONE TABLET BY MOUTH AT BREAKFAST AND AT BEDTIME 180 tablet 3   ezetimibe  (ZETIA ) 10 MG tablet Take 1 tablet (10 mg total) by mouth daily. 90 tablet 3   fexofenadine (ALLEGRA) 180 MG tablet Take 180 mg by mouth daily.     fluticasone  (FLONASE ) 50 MCG/ACT nasal spray Place 2 sprays into both nostrils in the morning and at bedtime. 16 g 1   furosemide  (LASIX ) 20 MG tablet TAKE 1 TABLET BY MOUTH ONCE DAILY 30 tablet 2   gabapentin  (NEURONTIN ) 400 MG capsule Take 1 capsule (400 mg total) by mouth at bedtime. 30 capsule 5   hydrOXYzine  (ATARAX ) 10 MG tablet TAKE 1-2 TABLETS BY MOUTH TWICE DAILY AS NEEDED FOR SEVERE ANXIETY ATTACKS ONLY 120 tablet 10   ipratropium (ATROVENT ) 0.03 % nasal spray Place 2 sprays into both nostrils 3 (three) times daily. 30 mL 1   leflunomide  (ARAVA ) 20 MG tablet Take 20 mg by mouth at bedtime.     meloxicam  (MOBIC ) 15 MG tablet Take 15 mg by mouth at bedtime.     mirtazapine  (REMERON ) 15 MG tablet TAKE 1 TABLET BY MOUTH EVERY DAY AT BEDTIME 30 tablet 10   montelukast  (SINGULAIR ) 10 MG tablet TAKE 1 TABLET BY MOUTH EVERY DAY AT BEDTIME 30 tablet 6   Multiple Vitamin (MULTIVITAMIN WITH MINERALS) TABS tablet Take 1 tablet by mouth daily.     Nebulizer MISC Compressor nebulizer to use with nebulized medications as instructed 1 each 0   oxyCODONE -acetaminophen  (PERCOCET) 5-325 MG tablet Take 1 tablet by mouth every 12 (twelve) hours as needed for severe pain (pain score 7-10). Must last 30 days. 60 tablet 0   [START ON 01/02/2024] oxyCODONE -acetaminophen  (PERCOCET) 5-325 MG tablet Take 1 tablet by mouth every  12 (twelve) hours as needed for severe pain (pain score 7-10). Must last 30 days. 60 tablet 0   potassium chloride  SA (KLOR-CON  M) 20 MEQ tablet Take 1 tablet (20 mEq total) by mouth every morning. 90 tablet 1   topiramate  (TOPAMAX ) 25 MG tablet Take 1 tablet (25 mg total) by mouth 2 (two) times daily. 60 tablet 11   valACYclovir  (VALTREX ) 1000 MG tablet Take 1 tablet (1,000 mg total) by mouth daily. 10 tablet 11   venlafaxine  XR (EFFEXOR -XR) 75 MG 24 hr capsule TAKE 2 CAPSULES BY MOUTH DAILY WITH BREAKFAST *DOSE CHANGE* 60 capsule 3   XIIDRA  5 % SOLN Place 1 drop into both eyes daily.     No current facility-administered medications for this visit.     Musculoskeletal: Strength & Muscle Tone:  UTA Gait & Station:  Seated Patient leans: N/A  Psychiatric Specialty Exam: Review of Systems  Psychiatric/Behavioral: Negative.      There were no vitals taken for this visit.There is no height or weight on file to calculate BMI.  General Appearance: Casual  Eye Contact:  Good  Speech:  Clear and Coherent  Volume:  Normal  Mood:  Euthymic  Affect:  Congruent  Thought Process:  Goal Directed and Descriptions of Associations: Intact  Orientation:  Full (Time, Place, and Person)  Thought Content: Logical   Suicidal Thoughts:  No  Homicidal Thoughts:  No  Memory:  Immediate;   Fair Recent;   Fair Remote;   Fair  Judgement:  Fair  Insight:  Fair  Psychomotor Activity:  Normal  Concentration:  Concentration: Fair and Attention Span: Fair  Recall:  [employer] of Knowledge: Fair  Language: Fair  Akathisia:  No  Handed:  Right  AIMS (if indicated): not done  Assets:  Desire for Improvement Housing Social Support Transportation  ADL's:  Intact  Cognition: WNL  Sleep:  Fair   Screenings: AIMS    Flowsheet Row Video Visit from 08/13/2022 in [facility] Office Visit from 05/13/2022 in [facility] Office  Visit from 03/31/2022 in [facility] Office Visit from 01/22/2022 in [facility] Office Visit from 12/26/2021 in [facility]  AIMS Total Score 0 0 0 0 0      GAD-7    Flowsheet Row Office Visit from 11/26/2023 in [facility] Office Visit from 10/19/2023 in [facility] Office Visit from 07/19/2023 in [facility] Office Visit from 06/21/2023 in [facility] Video Visit from 12/10/2022 in [facility]  Total GAD-7 Score 0 0 0 1 0      PHQ2-9    Flowsheet Row Office Visit from 11/26/2023 in [facility] Office Visit from 10/19/2023 in [facility] Video Visit from 09/28/2023 in [facility] Office Visit from 07/19/2023 in [facility] Office Visit from 06/21/2023 in [facility]  PHQ-2 Total Score 0 0 0 0 0  PHQ-9 Total Score 0 -- -- 0 --      Flowsheet Row Video Visit from 12/14/2023 in [facility] Office Visit from 10/19/2023 in [facility] Video Visit from 09/28/2023 in [facility]  C-SSRS RISK CATEGORY Moderate Risk Moderate Risk Moderate Risk        Assessment and Plan: Gabrielle Pineda is a 68 year old Caucasian female who has a history of depression, anxiety, insomnia was evaluated by telemedicine today.  Discussed assessment and plan as noted below.  Assessment & Plan Depression in remission Depression is well-managed with venlafaxine  150 mg daily and mirtazapine  15 mg daily. No significant anxiety, sadness, or  hopelessness reported. - Continue Venlafaxine  150 mg daily-reduced dosage - Continue Mirtazapine  15 mg at bedtime   Anxiety-stable Anxiety is well-controlled. No recent panic attacks or significant anxiety symptoms. Buspirone  was previously held due to headaches and is not currently being taken. - Continue Venlafaxine  and Mirtazapine  as prescribed.  Insomnia-stable Insomnia is well-managed with current treatment. Reports sleeping well. - Continue Mirtazapine  15 mg at bedtime - Continue sleep hygiene techniques  Alcoholism in remission Alcoholism remains in remission. No concerns or changes reported. - Has been sober since 1994.  Follow-up Follow-up in clinic in 5 months or sooner if needed.    Consent: Patient/Guardian gives verbal consent for treatment and assignment of benefits for services provided during this visit. Patient/Guardian expressed understanding and agreed to proceed.   This note was generated in part or whole with voice recognition software. Voice recognition is usually quite accurate but there are transcription errors that can and very often do occur. I apologize for any typographical errors that were not detected and corrected.    Calianne Larue, MD 12/16/2023, 7:54 AM

## 2023-12-22 ENCOUNTER — Other Ambulatory Visit: Payer: Self-pay

## 2023-12-22 ENCOUNTER — Inpatient Hospital Stay: Admission: RE | Admit: 2023-12-22 | Source: Ambulatory Visit

## 2023-12-22 ENCOUNTER — Encounter
Admission: RE | Admit: 2023-12-22 | Discharge: 2023-12-22 | Disposition: A | Discharge status: Home / Self Care | Source: Ambulatory Visit | Attending: Neurosurgery | Admitting: Neurosurgery

## 2023-12-22 VITALS — BP 150/78 | HR 80 | Resp 16 | Ht 69.0 in | Wt 167.1 lb

## 2023-12-22 DIAGNOSIS — I10 Essential (primary) hypertension: Secondary | ICD-10-CM | POA: Diagnosis not present

## 2023-12-22 DIAGNOSIS — Z01812 Encounter for preprocedural laboratory examination: Secondary | ICD-10-CM

## 2023-12-22 DIAGNOSIS — Z01818 Encounter for other preprocedural examination: Secondary | ICD-10-CM | POA: Insufficient documentation

## 2023-12-22 LAB — BASIC METABOLIC PANEL WITH GFR
Anion gap: 7 (ref 5–15)
BUN: 19 mg/dL (ref 8–23)
CO2: 22 mmol/L (ref 22–32)
Calcium: 8.9 mg/dL (ref 8.9–10.3)
Chloride: 108 mmol/L (ref 98–111)
Creatinine, Ser: 1.28 mg/dL — ABNORMAL HIGH (ref 0.44–1.00)
GFR, Estimated: 46 mL/min — ABNORMAL LOW (ref >60)
Glucose, Bld: 102 mg/dL — ABNORMAL HIGH (ref 70–99)
Potassium: 4 mmol/L (ref 3.5–5.1)
Sodium: 137 mmol/L (ref 135–145)

## 2023-12-22 LAB — CBC
HCT: 31 % — ABNORMAL LOW (ref 36.0–46.0)
Hemoglobin: 9.7 g/dL — ABNORMAL LOW (ref 12.0–15.0)
MCH: 32 pg (ref 26.0–34.0)
MCHC: 31.3 g/dL (ref 30.0–36.0)
MCV: 102.3 fL — ABNORMAL HIGH (ref 80.0–100.0)
Platelets: 237 10*3/uL (ref 150–400)
RBC: 3.03 MIL/uL — ABNORMAL LOW (ref 3.87–5.11)
RDW: 13.2 % (ref 11.5–15.5)
WBC: 4.4 10*3/uL (ref 4.0–10.5)
nRBC: 0 % (ref 0.0–0.2)

## 2023-12-22 LAB — SURGICAL PCR SCREEN
MRSA, PCR: NEGATIVE
Staphylococcus aureus: NEGATIVE

## 2023-12-22 NOTE — Patient Instructions (Addendum)
 Your procedure is scheduled on: 01/04/24 - Tuesday Report to the Registration Desk on the 1st floor of the [facility]. To find out your arrival time, please call [phone] between 1PM - 3PM on: 01/03/24 - Monday If your arrival time is 6:00 am, do not arrive before that time as the [facility] entrance doors do not open until 6:00 am.  REMEMBER: Instructions that are not followed completely may result in serious medical risk, up to and including death; or upon the discretion of your surgeon and anesthesiologist your surgery may need to be rescheduled.  Do not eat food after midnight the night before surgery.  No gum chewing or hard candies.  You may however, drink CLEAR liquids up to 2 hours before you are scheduled to arrive for your surgery. Do not drink anything within 2 hours of your scheduled arrival time.  Clear liquids include: - water  - apple juice without pulp - gatorade (not RED colors) - black coffee or tea (Do NOT add milk or creamers to the coffee or tea) Do NOT drink anything that is not on this list.  You may continue taking as needed Anti-inflammatories (NSAIDS) such as Advil , Aleve, Ibuprofen , Motrin , Naproxen, Naprosyn and Aspirin based products such as Excedrin, Goody's Powder, BC Powder.  Stop on 12/28/23, ANY OVER THE COUNTER supplements until after surgery : Multiple Vitamin   You may take Tylenol  if needed for pain up until the day of surgery.  ON THE DAY OF SURGERY ONLY TAKE THESE MEDICATIONS WITH SIPS OF WATER:  carvedilol  (COREG )  ezetimibe  (ZETIA )  fluticasone  (FLONASE )  oxyCODONE -acetaminophen  if needed topiramate  (TOPAMAX )  venlafaxine  XR (EFFEXOR -XR)  BREZTRI  pantoprazole  (PROTONIX )   Use inhaler albuterol  (VENTOLIN  HFA)  on the day of surgery and bring to the hospital  ,   No Alcohol for 24 hours before or after surgery.  No Smoking including e-cigarettes for 24 hours before surgery.  No chewable tobacco products for at least 6 hours  before surgery.  No nicotine  patches on the day of surgery.  Do not use any "recreational" drugs for at least a week (preferably 2 weeks) before your surgery.  Please be advised that the combination of cocaine and anesthesia may have negative outcomes, up to and including death. If you test positive for cocaine, your surgery will be cancelled.  On the morning of surgery brush your teeth with toothpaste and water, you may rinse your mouth with mouthwash if you wish. Do not swallow any toothpaste or mouthwash.  Use CHG Soap or wipes as directed on instruction sheet.  Do not wear jewelry, make-up, hairpins, clips or nail polish.  For welded (permanent) jewelry: bracelets, anklets, waist bands, etc.  Please have this removed prior to surgery.  If it is not removed, there is a chance that hospital personnel will need to cut it off on the day of surgery.  Do not wear lotions, powders, or perfumes.   Contact lenses, hearing aids and dentures may not be worn into surgery.  Do not bring valuables to the hospital. [facility] is not responsible for any missing/lost belongings or valuables.   Notify your doctor if there is any change in your medical condition (cold, fever, infection).  Wear comfortable clothing (specific to your surgery type) to the hospital.  After surgery, you can help prevent lung complications by doing breathing exercises.  Take deep breaths and cough every 1-2 hours. Your doctor may order a device called an Incentive Spirometer to help you take  deep breaths.  When coughing or sneezing, hold a pillow firmly against your incision with both hands. This is called "splinting." Doing this helps protect your incision. It also decreases belly discomfort.  If you are being admitted to the hospital overnight, leave your suitcase in the car. After surgery it may be brought to your room.  In case of increased patient census, it may be necessary for you, the patient, to continue your  postoperative care in the Same Day Surgery department.  If you are being discharged the day of surgery, you will not be allowed to drive home. You will need a responsible individual to drive you home and stay with you for 24 hours after surgery.   If you are taking public transportation, you will need to have a responsible individual with you.  Please call the Pre-admissions Testing Dept. at [phone] if you have any questions about these instructions.  Surgery Visitation Policy:  Patients having surgery or a procedure may have two visitors.  Children under the age of 82 must have an adult with them who is not the patient.  Inpatient Visitation:    Visiting hours are 7 a.m. to 8 p.m. Up to four visitors are allowed at one time in a patient room. The visitors may rotate out with other people during the day.  One visitor age 107 or older may stay with the patient overnight and must be in the room by 8 p.m.     Pre-operative 5 CHG Bath Instructions   You can play a key role in reducing the risk of infection after surgery. Your skin needs to be as free of germs as possible. You can reduce the number of germs on your skin by washing with CHG (chlorhexidine  gluconate) soap before surgery. CHG is an antiseptic soap that kills germs and continues to kill germs even after washing.   DO NOT use if you have an allergy to chlorhexidine /CHG or antibacterial soaps. If your skin becomes reddened or irritated, stop using the CHG and notify one of our RNs at [phone].   Please shower with the CHG soap starting 4 days before surgery using the following schedule: 05/16 - 05/20.               Please keep in mind the following:  DO NOT shave, including legs and underarms, starting the day of your first shower.   You may shave your face at any point before/day of surgery.  Place clean sheets on your bed the day you start using CHG soap. Use a clean washcloth (not used since being washed) for  each shower. DO NOT sleep with pets once you start using the CHG.   CHG Shower Instructions:  If you choose to wash your hair and private area, wash first with your normal shampoo/soap.  After you use shampoo/soap, rinse your hair and body thoroughly to remove shampoo/soap residue.  Turn the water OFF and apply about 3 tablespoons (45 ml) of CHG soap to a CLEAN washcloth.  Apply CHG soap ONLY FROM YOUR NECK DOWN TO YOUR TOES (washing for 3-5 minutes)  DO NOT use CHG soap on face, private areas, open wounds, or sores.  Pay special attention to the area where your surgery is being performed.  If you are having back surgery, having someone wash your back for you may be helpful. Wait 2 minutes after CHG soap is applied, then you may rinse off the CHG soap.  Pat dry with a clean towel  Put on clean clothes/pajamas   If you choose to wear lotion, please use ONLY the CHG-compatible lotions on the back of this paper.     Additional instructions for the day of surgery: DO NOT APPLY any lotions, deodorants, cologne, or perfumes.   Put on clean/comfortable clothes.  Brush your teeth.  Ask your nurse before applying any prescription medications to the skin.      CHG Compatible Lotions   Aveeno Moisturizing lotion  Cetaphil Moisturizing Cream  Cetaphil Moisturizing Lotion  Clairol Herbal Essence Moisturizing Lotion, Dry Skin  Clairol Herbal Essence Moisturizing Lotion, Extra Dry Skin  Clairol Herbal Essence Moisturizing Lotion, Normal Skin  Curel Age Defying Therapeutic Moisturizing Lotion with Alpha Hydroxy  Curel Extreme Care Body Lotion  Curel Soothing Hands Moisturizing Hand Lotion  Curel Therapeutic Moisturizing Cream, Fragrance-Free  Curel Therapeutic Moisturizing Lotion, Fragrance-Free  Curel Therapeutic Moisturizing Lotion, Original Formula  Eucerin Daily Replenishing Lotion  Eucerin Dry Skin Therapy Plus Alpha Hydroxy Crme  Eucerin Dry Skin Therapy Plus Alpha Hydroxy Lotion   Eucerin Original Crme  Eucerin Original Lotion  Eucerin Plus Crme Eucerin Plus Lotion  Eucerin TriLipid Replenishing Lotion  Keri Anti-Bacterial Hand Lotion  Keri Deep Conditioning Original Lotion Dry Skin Formula Softly Scented  Keri Deep Conditioning Original Lotion, Fragrance Free Sensitive Skin Formula  Keri Lotion Fast Absorbing Fragrance Free Sensitive Skin Formula  Keri Lotion Fast Absorbing Softly Scented Dry Skin Formula  Keri Original Lotion  Keri Skin Renewal Lotion Keri Silky Smooth Lotion  Keri Silky Smooth Sensitive Skin Lotion  Nivea Body Creamy Conditioning Oil  Nivea Body Extra Enriched Teacher, adult education Moisturizing Lotion Nivea Crme  Nivea Skin Firming Lotion  NutraDerm 30 Skin Lotion  NutraDerm Skin Lotion  NutraDerm Therapeutic Skin Cream  NutraDerm Therapeutic Skin Lotion  ProShield Protective Hand Cream  Provon moisturizing lotion

## 2023-12-23 ENCOUNTER — Other Ambulatory Visit: Payer: Self-pay | Admitting: Family Medicine

## 2023-12-23 DIAGNOSIS — J309 Allergic rhinitis, unspecified: Secondary | ICD-10-CM

## 2023-12-23 NOTE — Telephone Encounter (Signed)
 Copied from CRM [id number]. Topic: Clinical - Medication Refill >> Dec 23, 2023  2:18 PM Zipporah Him wrote: Medication: ipratropium (ATROVENT ) 0.03 % nasal spray  Has the patient contacted their pharmacy? Yes, pharmacy calling to refill  This is the patient's preferred pharmacy:  [facility] [place]  Dor[place], [place] - [address] [place] [address] Phone: [phone] Fax: [phone]    Is this the correct pharmacy for this prescription? Yes If no, delete pharmacy and type the correct one.   Has the prescription been filled recently? No  Is the patient out of the medication? Yes  Has the patient been seen for an appointment in the last year OR does the patient have an upcoming appointment? Yes  Can we respond through MyChart? No  Agent: Please be advised that Rx refills may take up to 3 business days. We ask that you follow-up with your pharmacy.

## 2023-12-24 NOTE — Telephone Encounter (Unsigned)
 Copied from CRM [id number]. Topic: Clinical - Medication Refill >> Dec 24, 2023 12:22 PM Felizardo Hotter wrote: Medication: ipratropium (ATROVENT ) 0.03 % nasal spray  Has the patient contacted their pharmacy? Yes (Agent: If no, request that the patient contact the pharmacy for the refill. If patient does not wish to contact the pharmacy document the reason why and proceed with request.) (Agent: If yes, when and what did the pharmacy advise?)  This is the patient's preferred pharmacy:  [facility] [place]  - [place], [place] - [address] [place] [address] Phone: [phone] Fax: [phone]  Is this the correct pharmacy for this prescription? Yes If no, delete pharmacy and type the correct one.   Has the prescription been filled recently? Yes  Is the patient out of the medication? Yes  Has the patient been seen for an appointment in the last year OR does the patient have an upcoming appointment? Yes  Can we respond through MyChart? Yes  Agent: Please be advised that Rx refills may take up to 3 business days. We ask that you follow-up with your pharmacy.

## 2023-12-27 NOTE — Telephone Encounter (Signed)
 Requested medications are due for refill today.  yes  Requested medications are on the active medications list.  yes  Last refill. 10/06/2023 30mL 1 rf  Future visit scheduled.   no  Notes to clinic.  Medication not assigned to a protocol. Please review for refill.    Requested Prescriptions  Pending Prescriptions Disp Refills   ipratropium (ATROVENT ) 0.03 % nasal spray 30 mL 1    Sig: Place 2 sprays into both nostrils 3 (three) times daily.     Off-Protocol Failed - 12/27/2023 10:25 AM      Failed - Medication not assigned to a protocol, review manually.      Passed - Valid encounter within last 12 months    Recent Outpatient Visits           1 month ago Annual physical exam   [facility] Carlean Charter, DO             Off-Protocol Failed - 12/27/2023 10:25 AM      Failed - Medication not assigned to a protocol, review manually.      Passed - Valid encounter within last 12 months    Recent Outpatient Visits           1 month ago Annual physical exam   [facility] [place], Asencion Blacksmith, DO

## 2023-12-27 NOTE — Telephone Encounter (Signed)
 Requested medication (s) are due for refill today - yes  Requested medication (s) are on the active medication list -yes  Future visit scheduled -no  Last refill: 10/06/23 30ml 1RF  Notes to clinic: off protocol- provider review   Requested Prescriptions  Pending Prescriptions Disp Refills   ipratropium (ATROVENT ) 0.03 % nasal spray 30 mL 1    Sig: Place 2 sprays into both nostrils 3 (three) times daily.     Off-Protocol Failed - 12/27/2023 10:27 AM      Failed - Medication not assigned to a protocol, review manually.      Passed - Valid encounter within last 12 months    Recent Outpatient Visits           1 month ago Annual physical exam   [facility] Carlean Charter, DO             Off-Protocol Failed - 12/27/2023 10:27 AM      Failed - Medication not assigned to a protocol, review manually.      Passed - Valid encounter within last 12 months    Recent Outpatient Visits           1 month ago Annual physical exam   [facility] [place], Asencion Blacksmith, DO                 Requested Prescriptions  Pending Prescriptions Disp Refills   ipratropium (ATROVENT ) 0.03 % nasal spray 30 mL 1    Sig: Place 2 sprays into both nostrils 3 (three) times daily.     Off-Protocol Failed - 12/27/2023 10:27 AM      Failed - Medication not assigned to a protocol, review manually.      Passed - Valid encounter within last 12 months    Recent Outpatient Visits           1 month ago Annual physical exam   [facility] Carlean Charter, DO             Off-Protocol Failed - 12/27/2023 10:27 AM      Failed - Medication not assigned to a protocol, review manually.      Passed - Valid encounter within last 12 months    Recent Outpatient Visits           1 month ago Annual physical exam   [facility] [place], Asencion Blacksmith, DO

## 2023-12-28 MED ORDER — IPRATROPIUM BROMIDE 0.03 % NA SOLN: 2.0000 | Freq: Three times a day (TID) | NASAL | 1 refills | Status: AC | PRN

## 2023-12-28 NOTE — Addendum Note (Signed)
 Addended by: Judyann Number on: 12/28/2023 08:40 AM   Modules accepted: Orders

## 2023-12-30 ENCOUNTER — Other Ambulatory Visit: Payer: Self-pay | Admitting: Psychiatry

## 2023-12-30 ENCOUNTER — Other Ambulatory Visit: Payer: Self-pay | Admitting: Cardiovascular Disease

## 2023-12-30 DIAGNOSIS — I471 Supraventricular tachycardia, unspecified: Secondary | ICD-10-CM

## 2023-12-30 DIAGNOSIS — I10 Essential (primary) hypertension: Secondary | ICD-10-CM

## 2023-12-31 ENCOUNTER — Other Ambulatory Visit: Payer: Self-pay | Admitting: Cardiovascular Disease

## 2023-12-31 NOTE — Telephone Encounter (Signed)
 Please schedule patient for an overdue annual follow up  for future refills ,last visit was 11/2022 Thank you

## 2024-01-03 ENCOUNTER — Telehealth: Payer: Self-pay | Admitting: Cardiovascular Disease

## 2024-01-03 NOTE — Telephone Encounter (Signed)
 Unable to leave voicemail, mailbox is full. Pt needs to schedule overdue appt from recall for med refill

## 2024-01-03 NOTE — Telephone Encounter (Signed)
 Unable to leave voicemail, mailbox is full

## 2024-01-04 ENCOUNTER — Encounter: Payer: Self-pay | Admitting: Neurosurgery

## 2024-01-04 ENCOUNTER — Other Ambulatory Visit: Payer: Self-pay

## 2024-01-04 ENCOUNTER — Ambulatory Visit: Payer: Self-pay | Admitting: Certified Registered Nurse Anesthetist

## 2024-01-04 ENCOUNTER — Ambulatory Visit
Admission: RE | Admit: 2024-01-04 | Discharge: 2024-01-04 | Disposition: A | Discharge status: Home / Self Care | Attending: Neurosurgery | Admitting: Neurosurgery

## 2024-01-04 ENCOUNTER — Ambulatory Visit: Payer: Self-pay | Admitting: Urgent Care

## 2024-01-04 ENCOUNTER — Encounter
Admission: RE | Disposition: A | Discharge status: Home / Self Care | Payer: Self-pay | Source: Home / Self Care | Attending: Neurosurgery

## 2024-01-04 DIAGNOSIS — Z4542 Encounter for adjustment and management of neuropacemaker (brain) (peripheral nerve) (spinal cord): Secondary | ICD-10-CM | POA: Diagnosis present

## 2024-01-04 DIAGNOSIS — Z9981 Dependence on supplemental oxygen: Secondary | ICD-10-CM | POA: Diagnosis not present

## 2024-01-04 DIAGNOSIS — J449 Chronic obstructive pulmonary disease, unspecified: Secondary | ICD-10-CM | POA: Diagnosis not present

## 2024-01-04 DIAGNOSIS — Z7951 Long term (current) use of inhaled steroids: Secondary | ICD-10-CM | POA: Insufficient documentation

## 2024-01-04 DIAGNOSIS — G894 Chronic pain syndrome: Secondary | ICD-10-CM | POA: Insufficient documentation

## 2024-01-04 DIAGNOSIS — I10 Essential (primary) hypertension: Secondary | ICD-10-CM | POA: Diagnosis not present

## 2024-01-04 DIAGNOSIS — Z01818 Encounter for other preprocedural examination: Secondary | ICD-10-CM

## 2024-01-04 DIAGNOSIS — K219 Gastro-esophageal reflux disease without esophagitis: Secondary | ICD-10-CM | POA: Insufficient documentation

## 2024-01-04 DIAGNOSIS — Z87891 Personal history of nicotine dependence: Secondary | ICD-10-CM | POA: Insufficient documentation

## 2024-01-04 HISTORY — PX: SPINAL CORD STIMULATOR BATTERY EXCHANGE: SHX6202

## 2024-01-04 SURGERY — SPINAL CORD STIMULATOR BATTERY EXCHANGE
Anesthesia: General | Site: Back | Laterality: Right

## 2024-01-04 MED ORDER — PROPOFOL 10 MG/ML IV BOLUS
INTRAVENOUS | Status: AC
Start: 2024-01-04 — End: ?
  Filled 2024-01-04: qty 20

## 2024-01-04 MED ORDER — DEXMEDETOMIDINE HCL IN NACL 80 MCG/20ML IV SOLN
INTRAVENOUS | Status: DC | PRN
  Administered 2024-01-04: 8 ug via INTRAVENOUS

## 2024-01-04 MED ORDER — BUPIVACAINE-EPINEPHRINE (PF) 0.5% -1:200000 IJ SOLN
INTRAMUSCULAR | Status: DC | PRN
  Administered 2024-01-04: 10 mL

## 2024-01-04 MED ORDER — VANCOMYCIN HCL IN DEXTROSE 1-5 GM/200ML-% IV SOLN
1000.0000 mg | Freq: Once | INTRAVENOUS | Status: AC
  Administered 2024-01-04: 1000 mg via INTRAVENOUS

## 2024-01-04 MED ORDER — LIDOCAINE HCL (PF) 2 % IJ SOLN
INTRAMUSCULAR | Status: AC
  Filled 2024-01-04: qty 5

## 2024-01-04 MED ORDER — CEFAZOLIN SODIUM-DEXTROSE 2-4 GM/100ML-% IV SOLN
2.0000 g | INTRAVENOUS | Status: AC
  Administered 2024-01-04: 2 g via INTRAVENOUS

## 2024-01-04 MED ORDER — SUCCINYLCHOLINE CHLORIDE 200 MG/10ML IV SOSY
PREFILLED_SYRINGE | INTRAVENOUS | Status: AC
  Filled 2024-01-04: qty 10

## 2024-01-04 MED ORDER — GLYCOPYRROLATE 0.2 MG/ML IJ SOLN
INTRAMUSCULAR | Status: AC
  Filled 2024-01-04: qty 1

## 2024-01-04 MED ORDER — LACTATED RINGERS IV SOLN: INTRAVENOUS | Status: DC

## 2024-01-04 MED ORDER — MIDAZOLAM HCL 2 MG/2ML IJ SOLN
INTRAMUSCULAR | Status: AC
  Filled 2024-01-04: qty 2

## 2024-01-04 MED ORDER — BUPIVACAINE-EPINEPHRINE (PF) 0.5% -1:200000 IJ SOLN
INTRAMUSCULAR | Status: AC
  Filled 2024-01-04: qty 10

## 2024-01-04 MED ORDER — ONDANSETRON HCL 4 MG/2ML IJ SOLN
INTRAMUSCULAR | Status: AC
  Filled 2024-01-04: qty 2

## 2024-01-04 MED ORDER — KETAMINE HCL 50 MG/5ML IJ SOSY
PREFILLED_SYRINGE | INTRAMUSCULAR | Status: AC
  Filled 2024-01-04: qty 5

## 2024-01-04 MED ORDER — CHLORHEXIDINE GLUCONATE 0.12 % MT SOLN
15.0000 mL | Freq: Once | OROMUCOSAL | Status: AC
  Administered 2024-01-04: 15 mL via OROMUCOSAL

## 2024-01-04 MED ORDER — MIDAZOLAM HCL 2 MG/2ML IJ SOLN
INTRAMUSCULAR | Status: DC | PRN
  Administered 2024-01-04: 2 mg via INTRAVENOUS

## 2024-01-04 MED ORDER — DEXAMETHASONE SODIUM PHOSPHATE 10 MG/ML IJ SOLN
INTRAMUSCULAR | Status: AC
  Filled 2024-01-04: qty 1

## 2024-01-04 MED ORDER — CHLORHEXIDINE GLUCONATE 0.12 % MT SOLN
OROMUCOSAL | Status: AC
  Filled 2024-01-04: qty 15

## 2024-01-04 MED ORDER — CEFAZOLIN SODIUM-DEXTROSE 2-4 GM/100ML-% IV SOLN
INTRAVENOUS | Status: AC
  Filled 2024-01-04: qty 100

## 2024-01-04 MED ORDER — ORAL CARE MOUTH RINSE: 15.0000 mL | Freq: Once | OROMUCOSAL | Status: AC

## 2024-01-04 MED ORDER — VANCOMYCIN HCL IN DEXTROSE 1-5 GM/200ML-% IV SOLN
INTRAVENOUS | Status: AC
  Filled 2024-01-04: qty 200

## 2024-01-04 MED ORDER — DEXAMETHASONE SODIUM PHOSPHATE 10 MG/ML IJ SOLN
INTRAMUSCULAR | Status: DC | PRN
Start: 2024-01-04 — End: 2024-01-04
  Administered 2024-01-04: 8 mg via INTRAVENOUS

## 2024-01-04 MED ORDER — CEFAZOLIN IN SODIUM CHLORIDE 2-0.9 GM/100ML-% IV SOLN
2.0000 g | Freq: Once | INTRAVENOUS | Status: DC
  Filled 2024-01-04: qty 100

## 2024-01-04 MED ORDER — 0.9 % SODIUM CHLORIDE (POUR BTL) OPTIME
TOPICAL | Status: DC | PRN
  Administered 2024-01-04: 500 mL

## 2024-01-04 MED ORDER — LIDOCAINE HCL (CARDIAC) PF 100 MG/5ML IV SOSY
PREFILLED_SYRINGE | INTRAVENOUS | Status: DC | PRN
  Administered 2024-01-04: 100 mg via INTRAVENOUS

## 2024-01-04 MED ORDER — KETAMINE HCL 50 MG/5ML IJ SOSY
PREFILLED_SYRINGE | INTRAMUSCULAR | Status: DC | PRN
  Administered 2024-01-04: 20 mg via INTRAVENOUS

## 2024-01-04 MED ORDER — PROPOFOL 1000 MG/100ML IV EMUL
INTRAVENOUS | Status: AC
  Filled 2024-01-04: qty 100

## 2024-01-04 MED ORDER — PROPOFOL 10 MG/ML IV BOLUS
INTRAVENOUS | Status: DC | PRN
  Administered 2024-01-04: 100 ug/kg/min via INTRAVENOUS
  Administered 2024-01-04: 100 mg via INTRAVENOUS

## 2024-01-04 MED ORDER — OXYCODONE HCL 5 MG PO TABS: 5.0000 mg | ORAL_TABLET | Freq: Four times a day (QID) | ORAL | 0 refills | Status: AC | PRN

## 2024-01-04 MED ORDER — SUCCINYLCHOLINE CHLORIDE 200 MG/10ML IV SOSY
PREFILLED_SYRINGE | INTRAVENOUS | Status: DC | PRN
  Administered 2024-01-04: 100 mg via INTRAVENOUS

## 2024-01-04 MED ORDER — GLYCOPYRROLATE 0.2 MG/ML IJ SOLN
INTRAMUSCULAR | Status: DC | PRN
  Administered 2024-01-04: .2 mg via INTRAVENOUS

## 2024-01-04 MED ORDER — ONDANSETRON HCL 4 MG/2ML IJ SOLN
INTRAMUSCULAR | Status: DC | PRN
  Administered 2024-01-04: 4 mg via INTRAVENOUS

## 2024-01-04 SURGICAL SUPPLY — 33 items
BRUSH SCRUB EZ 4% CHG (MISCELLANEOUS) IMPLANT
DERMABOND ADVANCED .7 DNX12 (GAUZE/BANDAGES/DRESSINGS) ×2 IMPLANT
DRAPE LAPAROTOMY 100X77 ABD (DRAPES) ×1 IMPLANT
DRSG OPSITE POSTOP 4X6 (GAUZE/BANDAGES/DRESSINGS) IMPLANT
DRSG OPSITE POSTOP 4X8 (GAUZE/BANDAGES/DRESSINGS) IMPLANT
ELECTRODE REM PT RTRN 9FT ADLT (ELECTROSURGICAL) ×1 IMPLANT
FEE INTRAOP CADWELL SUPPLY NCS (MISCELLANEOUS) ×1 IMPLANT
FEE INTRAOP MONITOR IMPULS NCS (MISCELLANEOUS) ×1 IMPLANT
GLOVE BIOGEL PI IND STRL 7.0 (GLOVE) ×1 IMPLANT
GLOVE BIOGEL PI IND STRL 8 (GLOVE) ×1 IMPLANT
GLOVE SRG 8 PF TXTR STRL LF DI (GLOVE) ×1 IMPLANT
GLOVE SURG SYN 7.0 (GLOVE) IMPLANT
GLOVE SURG SYN 7.0 PF PI (GLOVE) ×1 IMPLANT
GLOVE SURG SYN 7.5 E (GLOVE) IMPLANT
GLOVE SURG SYN 7.5 PF PI (GLOVE) ×1 IMPLANT
GOWN SRG LRG LVL 4 IMPRV REINF (GOWNS) ×1 IMPLANT
GOWN SRG XL LVL 3 NONREINFORCE (GOWNS) ×1 IMPLANT
KIT IPG ALPHA 16 WAVEWRITER (Spinal Cord Stimulator) IMPLANT
KIT SPINAL PRONEVIEW (KITS) ×1 IMPLANT
LAVAGE JET IRRISEPT WOUND (IRRIGATION / IRRIGATOR) ×1 IMPLANT
MANIFOLD NEPTUNE II (INSTRUMENTS) ×1 IMPLANT
NDL SAFETY ECLIPSE 18X1.5 (NEEDLE) ×1 IMPLANT
NS IRRIG 500ML POUR BTL (IV SOLUTION) ×1 IMPLANT
PACK LAMINECTOMY ARMC (PACKS) ×1 IMPLANT
PAD ARMBOARD POSITIONER FOAM (MISCELLANEOUS) ×1 IMPLANT
STAPLER SKIN PROX 35W (STAPLE) IMPLANT
SURGIFLO W/THROMBIN 8M KIT (HEMOSTASIS) ×1 IMPLANT
SUT SILK 2 0SH CR/8 30 (SUTURE) ×1 IMPLANT
SUT STRATA 3-0 15 PS-2 (SUTURE) IMPLANT
SUT VIC AB 0 CT1 18XCR BRD 8 (SUTURE) ×2 IMPLANT
SUT VIC AB 2-0 CT1 18 (SUTURE) ×2 IMPLANT
SYR 30ML LL (SYRINGE) ×2 IMPLANT
TRAP FLUID SMOKE EVACUATOR (MISCELLANEOUS) ×1 IMPLANT

## 2024-01-04 NOTE — H&P (View-Only) (Signed)
 Referring Physician:  Carroll Clamp, MD [address] [place],  [place] [address]  Primary Physician:  Carlean Charter, DO  History of Present Illness: 01/04/2024 Ms. Jenevieve Manninen is here today with a chief complaint of battery at the end of its life.  She had a spinal cord stimulator implanted 3 years ago but has difficulty with regular charging so has not been able to follow along with therapeutic treatment.  This been going on for over 3 years.  She would like to switch her device out to a charge of the stimulator.  She continues to have significant back pain, get some claudicatory symptoms.  Has also a history of chronic myelopathy with weakness and gait instability.  She does feel like her stumbling may be getting worse.  She has not worked out with any recent physical therapy.  She has not had any recent injections.  In 2020 had a L3-S1 decompression with Dr. Nigel Bart, had a thoracic spinal cord stimulator placed in 2022.  Conservative measures:  Physical therapy: Has not participated in PT  Multimodal medical therapy including regular antiinflammatories: Gabapentin , Meloxicam , Oxycodone ,   Injections: none epidural steroid injections  Past Surgery: 08/15/2019 L3-S1 Decompression Surgeon: Manya Sells, MD 03/17/2021 Thoracic SCS Surgeon: Berta Brittle, MD  I have utilized the care everywhere function in epic to review the outside records available from external health systems.  Specifically reviewed Dr. Princella Brooklyn operative note from August 2022 and postoperative notes from Dr. Nigel Bart in 2021.  Review of Systems:  A 10 point review of systems is negative, except for the pertinent positives and negatives detailed in the HPI.  Past Medical History: Past Medical History:  Diagnosis Date   Alcohol abuse    Allergy 1983   Anemia    Anxiety    Arthritis    Cataract    Cervicalgia    Cirrhosis (HCC) 1994   COPD (chronic obstructive pulmonary disease) (HCC)    wears O2  at night   Depression    Difficult intubation    has plates and screws in neck   Dyspnea    GERD (gastroesophageal reflux disease)    Headache    Heart murmur    on heard when pt is lying   Hypertension    Neuromuscular disorder (HCC)    Neuropathy   Other and unspecified hyperlipidemia    Oxygen  deficiency    Status post insertion of spinal cord stimulator    Tachycardia    d/t questionable anxiety happens every 5- 10 years, sees [facility]    Past Surgical History: Past Surgical History:  Procedure Laterality Date   ANTERIOR CERVICAL DECOMP/DISCECTOMY FUSION  2012, 2015, 2018   x3   AUGMENTATION MAMMAPLASTY Bilateral 1982   BREAST ENHANCEMENT SURGERY  1981   CARPAL TUNNEL RELEASE  11/11/2011   Procedure: CARPAL TUNNEL RELEASE;  Surgeon: Adelbert Adler, MD;  Location: [facility];  Service: Neurosurgery;  Laterality: Right;  Right Median Nerve Decompression   CARPAL TUNNEL RELEASE  2013   COLONOSCOPY WITH PROPOFOL  N/A 11/01/2017   Procedure: COLONOSCOPY WITH PROPOFOL ;  Surgeon: Cassie Click, MD;  Location: [facility] ENDOSCOPY;  Service: Endoscopy;  Laterality: N/A;   COLONOSCOPY WITH PROPOFOL  N/A 08/09/2023   Procedure: COLONOSCOPY WITH PROPOFOL ;  Surgeon: Quintin Buckle, DO;  Location[facility] ENDOSCOPY;  Service: Gastroenterology;  Laterality: N/A;   ESOPHAGOGASTRODUODENOSCOPY     ESOPHAGOGASTRODUODENOSCOPY (EGD) WITH PROPOFOL  N/A 08/09/2023   Procedure: ESOPHAGOGASTRODUODENOSCOPY (EGD) WITH PROPOFOL ;  Surgeon: Quintin Buckle, DO;  Location: [facility] ENDOSCOPY;  Service: Gastroenterology;  Laterality: N/A;   JOINT REPLACEMENT  2022   LUMBAR LAMINECTOMY/DECOMPRESSION MICRODISCECTOMY N/A 08/15/2019   Procedure: Lumbar three to Sacral one Decompressive lumbar laminectomy;  Surgeon: Manya Sells, MD;  Location: [facility] OR;  Service: Neurosurgery;  Laterality: N/A;   neck disc surgery     plates and screws in neck x 2   POLYPECTOMY  08/09/2023   Procedure:  POLYPECTOMY;  Surgeon: Quintin Buckle, DO;  Location: [facility] ENDOSCOPY;  Service: Gastroenterology;;   ROTATOR CUFF REPAIR  06/16/2016   right shoulder    ROTATOR CUFF REPAIR     SPINAL CORD STIMULATOR INSERTION N/A 03/17/2021   Procedure: THORACIC SPINAL CORD STIMULATOR (PERCUTANEOUS) & PULSE GENERATOR PLACEMENT;  Surgeon: Berta Brittle, MD;  Location: [facility] ORS;  Service: Neurosurgery;  Laterality: N/A;   SPINE SURGERY  2020   TONSILLECTOMY AND ADENOIDECTOMY  1973   TOTAL HIP ARTHROPLASTY Right 04/15/2021   Procedure: TOTAL HIP ARTHROPLASTY ANTERIOR APPROACH;  Surgeon: Molli Angelucci, MD;  Location: [facility] ORS;  Service: Orthopedics;  Laterality: Right;    Allergies: Allergies as of 11/15/2023 - Review Complete 11/08/2023  Allergen Reaction Noted   Sulfasalazine Other (See Comments) 04/17/2022   Plaquenil [hydroxychloroquine] Rash 04/03/2015    Medications:  Current Facility-Administered Medications:    ceFAZolin  (ANCEF ) IVPB 2g/100 mL premix, 2 g, Intravenous, 60 min Pre-Op, Carroll Clamp, MD   lactated ringers  infusion, , Intravenous, Continuous, Piscitello, Portia Brittle, MD, Last Rate: 10 mL/hr at 01/04/24 8119, New Bag at 01/04/24 1478   vancomycin  (VANCOCIN ) IVPB 1000 mg/200 mL premix, 1,000 mg, Intravenous, Once, Carroll Clamp, MD, Last Rate: 200 mL/hr at 01/04/24 2956, 1,000 mg at 01/04/24 2130  Social History: Social History   Tobacco Use   Smoking status: Former    Current packs/day: 0.00    Average packs/day: 2.0 packs/day for 45.0 years (90.0 ttl pk-yrs)    Types: Cigarettes    Start date: 10/29/1975    Quit date: 10/28/2020    Years since quitting: 3.1   Smokeless tobacco: Never   Tobacco comments:    Quit smoking in 2022- khj 09/24/2023  Vaping Use   Vaping status: Former   Devices: vape occasionally  Substance Use Topics   Alcohol use: No    Comment: recovering alcoholic Since 1994    Drug use: Never    Family Medical History: Family History  Problem  Relation Age of Onset   Alcohol abuse Mother    Bipolar disorder Mother    Suicidality Mother    Arthritis Mother    Early death Mother    Pancreatic cancer Father    Alcohol abuse Father    Cancer Father    Drug abuse Sister    Alcohol abuse Sister    Depression Sister    Alcohol abuse Sister    Anxiety disorder Sister    Anesthesia problems Neg Hx    Breast cancer Neg Hx     Physical Examination: Vitals:   01/04/24 0611  BP: (!) 141/73  Pulse: 66  Resp: 16  Temp: (!) 97.1 F (36.2 C)  SpO2: 98%    General: Patient is in no apparent distress. Attention to examination is appropriate.  Neck:   Supple.  Full range of motion.  Respiratory: Patient is breathing without any difficulty.   NEUROLOGICAL:     Awake, alert, oriented to person, place, and time.  Speech is clear and fluent.   Cranial  Nerves: Pupils equal round and reactive to light.  Facial tone is symmetric.  Facial sensation is symmetric. Shoulder shrug is symmetric. Tongue protrusion is midline.    Strength:  Side Iliopsoas Quads Hamstring PF DF EHL  R 5 5 5 5 5 5   L 5 5 5 5 3 2    Reflexes are split, hyperreflexic on the right and hyperreflexic on the left.  Patchy sensation loss in the bilateral lower extremities  Imaging: CLINICAL DATA: Evaluate for excess motion  EXAM: LUMBAR SPINE - COMPLETE 4+ VIEW  COMPARISON: MRI lumbar spine June 20, 2020, lumbosacral spine August 15, 2019  FINDINGS: Stimulator thoracic spine interest of the L1 level, the tip of the electrodes are T8-T9.  Status post prior laminectomy L4-L5  There is degenerative disc disease with narrowing of the L3-L4 L4-5 L5-S1 disc spaces with grade 1 posterior listhesis of L5 on L4.  Flexion-extension images demonstrates no significant change in the degree of spondylolisthesis  IMPRESSION: *Degenerative disc disease with grade 1 posterior listhesis of L5 on L4. *Stimulator thoracic spine interest of the L1 level,  the tip of the electrodes are T8-T9.   Electronically Signed By: Fredrich Jefferson M.D. On: 11/05/2023 17:22    Narrative & Impression  CLINICAL DATA:  Low back pain with lumbar radiculopathy. Bilateral hip and leg pain.   EXAM: MRI LUMBAR SPINE WITHOUT CONTRAST   TECHNIQUE: Multiplanar, multisequence MR imaging of the lumbar spine was performed. No intravenous contrast was administered.   COMPARISON:  Lumbar spine MRI 06/20/2020.   FINDINGS: Segmentation: Conventional numbering is assumed with 5 non-rib-bearing, lumbar type vertebral bodies.   Alignment:  New grade 1 anterolisthesis of L4 on L5.   Vertebrae: Prior L3-S1 laminectomy. Unchanged chronic, degenerative height loss of the L5 vertebral body.   Conus medullaris and cauda equina: Conus extends to the L2 level. Conus and cauda equina appear normal.   Paraspinal and other soft tissues: Fatty atrophy of the paraspinal muscles. Spinal cord stimulator enters the dorsal epidural space at T11-12.   Disc levels:   T12-L1: Small disc bulge and mild bilateral facet arthropathy. No spinal canal stenosis or neural foraminal narrowing.   L1-L2: Small disc bulge and mild bilateral facet arthropathy. No spinal canal stenosis or neural foraminal narrowing.   L2-L3: Small disc bulge and mild bilateral facet arthropathy. No spinal canal stenosis or neural foraminal narrowing.   L3-L4: Prior laminectomy. Disc bulge with central annular fissure. Mild bilateral facet arthropathy. No spinal canal stenosis or neural foraminal narrowing.   L4-L5: Anterolisthesis with uncovered disc and superimposed disc bulge. Severe bilateral facet arthropathy with joint effusions, periarticular edema, and medially projecting synovial cyst, measuring up to 12 x 4 mm (image 30 series 6). Severe spinal canal stenosis and severe bilateral neural foraminal narrowing, worse from prior.   L5-S1: Prior laminectomy. Small disc bulge and mild  bilateral facet arthropathy. No spinal canal stenosis. Moderate bilateral neural foraminal narrowing.   IMPRESSION: 1. Progression of lower lumbar spondylosis, worst at L4-L5, where there is now severe spinal canal stenosis and severe bilateral neural foraminal narrowing. 2. Moderate bilateral neural foraminal narrowing at L5-S1.     Electronically Signed   By: Audra Blend M.D.   On: 11/03/2022 16:47   I have personally reviewed the images and agree with the above interpretation.  In addition the x-ray shows the IPG in the right flank with clear access to leads and battery.  Medical Decision Making/Assessment and Plan: Ms. Monterrosa is a pleasant 68 y.o.  female with history of chronic back pain.  She has had multilevel lumbar laminectomy as well as a thoracic spinal cord stimulator.  She has been having difficulty with her spinal cord stimulator as far as recharging goes.  She like to switch this out to a none rechargeable product so that she can get more useful stimulation on a more regular basis.  She does feel like she is having some worsening of her claudication on history, she does have severe significant stenosis.  For this we will also recommend starting physical therapy and having her follow-up with us  to evaluate for any need for repeat decompression or fusion.  As far as the stimulator goes we can plan on replacing her IPG so she can get more regular treatment.  Will work with her to find a time for scheduling.  Thank you for involving me in the care of this patient.    Carroll Clamp MD/MSCR Neurosurgery

## 2024-01-04 NOTE — Op Note (Signed)
 Indications: the patient is a 68yo female who was diagnosed with chronic pain syndrome, has a battery at the end of its life had difficulty with the rechargeable settings with hearing at maintaining charge, opted for a switch out for a permanent nonrechargeable system.   Findings: successful replacement of a spinal cord stimulator IPG  Preoperative Diagnosis: Battery End of life of Spinal Cord Stimulator  Chronic Pain Syndrome  Postoperative Diagnosis: Battery End of life of Spinal Cord Stimulator Chronic Pain Syndrome     EBL: Minimal IVF: see anesthesia record Drains: none Disposition: Extubated and Stable to PACU Complications: none   No foley catheter was placed.   Procedure: Replacement of IPG for spinal cord stimulator  Preoperative Note:    Risks of surgery discussed in clinic.   Operative Note:      The patient was then brought from the preoperative center with intravenous access established.  The patient underwent general anesthesia and endotracheal tube intubation, then was rotated on the [facility] table where all pressure points were appropriately padded.  Prior right sided flank incision was marked. The skin was then thoroughly cleansed.  Perioperative antibiotic prophylaxis was administered.  Sterile prep and drapes were then applied and a timeout was then observed.    The incision on the flank was then opened and a pocket formed until we were able to identify the pulse generator.  Safe use of the Bovie was utilized until we are able to identify the hardware and its leads.  Once we are nearby we discontinue the use of electrocautery and used blunt dissection.  We are able to identify the IPG and remove it from its pocket.  Given the size of the nonrechargeable battery we expanded the pocket.  We copiously irrigated.  We removed the battery and exchanged it for a nonrechargeable system.  The impedances were checked and good.  The contacts were good.  The wound was copiously  irrigated, the wound was then closed in layers.  Glue was used on the Moh superficial layer.  Sterile dressing was applied.    Patient was then rotated back to the preoperative bed awakened from anesthesia and taken to recovery. All counts are correct in this case.   I performed the entire procedure without an assistant surgeon  Implant Name Type Inv. Item Serial No. Manufacturer Lot No. LRB No. Used Action  KIT IPG ALPHA 16 WAVEWRITER - SNA Spinal Cord Stimulator KIT IPG ALPHA 16 WAVEWRITER NA [employer] [id number] Right 1 Implanted    Carroll Clamp, MD

## 2024-01-04 NOTE — Discharge Instructions (Addendum)
 Your surgeon has performed an procedure implanting a Battery for Spinal Cord Stimulator Alone. This involved making an incision in the Flank.   The following are instructions to help in your recovery once you have been discharged from the hospital. Even if you feel well, it is important that you follow these activity guidelines. If you do not let your body heal properly from the surgery, you can increase the chance of return of your symptoms and other complications.  You will be working with your spinal cord stimulator team for programming and titrations.  Most patients require multiple titrations before we get the settings just right.  We appreciate your patience and working with [place] to optimize your care.  *NSAIDs are okay to take after surgery  Activity    No bending, lifting, or twisting ("BLT"). Avoid lifting objects heavier than 10 pounds (gallon milk jug).  Where possible, avoid household activities that involve lifting, bending, reaching, pushing, or pulling such as laundry, vacuuming, grocery shopping, and childcare. Try to arrange for help from friends and family for these activities while your back heals.  Increase physical activity slowly as tolerated.  Taking short walks is encouraged, but avoid strenuous exercise. Do not jog, run, bicycle, lift weights, or participate in any other exercises unless specifically allowed by your doctor.  Talk to your doctor before resuming sexual activity.  You should not drive until cleared by your doctor.  Until released by your doctor, you should not return to work or school.  You should rest at home and let your body heal.   You may shower three days after your surgery.  After showering, lightly dab your incision dry. Do not take a tub bath or go swimming until approved by your doctor at your follow-up appointment.  If you smoke, we strongly recommend that you quit.  Smoking has been proven to interfere with normal bone healing and will dramatically  reduce the success rate of your surgery. Please contact [facility] (800-QUIT-NOW) and use the resources at www.[facility].com for assistance in stopping smoking.  Surgical Incision   If you have a dressing on your incision, you may remove it 3 days after your surgery. Keep your incision area clean and dry.  Your stitches are under your skin and dissolvable.  They are covered by surgical glue.  The glue should begin to peel away within about a week.  Diet           You may return to your usual diet. Be sure to stay hydrated.  When to Contact us  You may experience pain in your neck and/or pain between your shoulder blades, or at your back depending on your incision sites.. This is normal and should improve in the next few weeks with the help of pain medication, muscle relaxers, and rest. Some patients report that a warm compress on the back of the neck or between the shoulder blades helps.  However, should you experience any of the following, contact us immediately: New numbness or weakness Pain that is progressively getting worse, and is not relieved by your pain medication, muscle relaxers, rest, and warm compresses Bleeding, redness, swelling, pain, or drainage from surgical incision Chills or flu-like symptoms Fever greater than 101.0 F (38.3 C) Inability to eat, drink fluids, or take medications Problems with bowel or bladder functions Difficulty breathing or shortness of breath Warmth, tenderness, or swelling in your calf Contact Information How to contact us:  If you have any questions/concerns before or after surgery, you  can reach [place] at [phone], or you can send a mychart message. We can be reached by phone or mychart 8am-4pm, Monday-Friday.  *Please note: Calls after 4pm are forwarded to a third party answering service. Mychart messages are not routinely monitored during evenings, weekends, and holidays. Please call our office to contact the answering service for urgent  concerns during non-business hours.

## 2024-01-04 NOTE — Interval H&P Note (Signed)
 History and Physical Interval Note:  01/04/2024 7:08 AM  Gabrielle Pineda  has presented today for surgery, with the diagnosis of Battery End of life of Spinal Cord Stimulator  Chronic Pain Syndrome.  The various methods of treatment have been discussed with the patient and family. After consideration of risks, benefits and other options for treatment, the patient has consented to  Procedure(s) with comments: SPINAL CORD STIMULATOR BATTERY EXCHANGE (Right) - Revision Internal Pulse Generator for Spinal Cord Stimulator (Non-rechargeable) as a surgical intervention.  The patient's history has been reviewed, patient examined, no change in status, stable for surgery.  I have reviewed the patient's chart and labs.  Questions were answered to the patient's satisfaction.    Heart and lungs clear  Carroll Clamp

## 2024-01-04 NOTE — Progress Notes (Signed)
 Referring Physician:  Carroll Clamp, MD [address] [place],  [place] [address]  Primary Physician:  Carlean Charter, DO  History of Present Illness: 01/04/2024 [name] is here today with a chief complaint of battery at the end of its life.  She had a spinal cord stimulator implanted 3 years ago but has difficulty with regular charging so has not been able to follow along with therapeutic treatment.  This been going on for over 3 years.  She would like to switch her device out to a charge of the stimulator.  She continues to have significant back pain, get some claudicatory symptoms.  Has also a history of chronic myelopathy with weakness and gait instability.  She does feel like her stumbling may be getting worse.  She has not worked out with any recent physical therapy.  She has not had any recent injections.  In 2020 had a L3-S1 decompression with Dr. Nigel Bart, had a thoracic spinal cord stimulator placed in 2022.  Conservative measures:  Physical therapy: Has not participated in PT  Multimodal medical therapy including regular antiinflammatories: Gabapentin , Meloxicam , Oxycodone ,   Injections: none epidural steroid injections  Past Surgery: 08/15/2019 L3-S1 Decompression Surgeon: Manya Sells, MD 03/17/2021 Thoracic SCS Surgeon: Berta Brittle, MD  I have utilized the care everywhere function in epic to review the outside records available from external health systems.  Specifically reviewed Dr. Princella Brooklyn operative note from August 2022 and postoperative notes from Dr. Nigel Bart in 2021.  Review of Systems:  A 10 point review of systems is negative, except for the pertinent positives and negatives detailed in the HPI.  Past Medical History: Past Medical History:  Diagnosis Date   Alcohol abuse    Allergy 1983   Anemia    Anxiety    Arthritis    Cataract    Cervicalgia    Cirrhosis (HCC) 1994   COPD (chronic obstructive pulmonary disease) (HCC)    wears O2  at night   Depression    Difficult intubation    has plates and screws in neck   Dyspnea    GERD (gastroesophageal reflux disease)    Headache    Heart murmur    on heard when pt is lying   Hypertension    Neuromuscular disorder (HCC)    Neuropathy   Other and unspecified hyperlipidemia    Oxygen  deficiency    Status post insertion of spinal cord stimulator    Tachycardia    d/t questionable anxiety happens every 5- 10 years, sees [facility]    Past Surgical History: Past Surgical History:  Procedure Laterality Date   ANTERIOR CERVICAL DECOMP/DISCECTOMY FUSION  2012, 2015, 2018   x3   AUGMENTATION MAMMAPLASTY Bilateral 1982   BREAST ENHANCEMENT SURGERY  1981   CARPAL TUNNEL RELEASE  11/11/2011   Procedure: CARPAL TUNNEL RELEASE;  Surgeon: Adelbert Adler, MD;  Location: [facility];  Service: Neurosurgery;  Laterality: Right;  Right Median Nerve Decompression   CARPAL TUNNEL RELEASE  2013   COLONOSCOPY WITH PROPOFOL  N/A 11/01/2017   Procedure: COLONOSCOPY WITH PROPOFOL ;  Surgeon: Cassie Click, MD;  Location: [facility] ENDOSCOPY;  Service: Endoscopy;  Laterality: N/A;   COLONOSCOPY WITH PROPOFOL  N/A 08/09/2023   Procedure: COLONOSCOPY WITH PROPOFOL ;  Surgeon: Quintin Buckle, DO;  [facility] ENDOSCOPY;  Service: Gastroenterology;  Laterality: N/A;   ESOPHAGOGASTRODUODENOSCOPY     ESOPHAGOGASTRODUODENOSCOPY (EGD) WITH PROPOFOL  N/A 08/09/2023   Procedure: ESOPHAGOGASTRODUODENOSCOPY (EGD) WITH PROPOFOL ;  Surgeon: Quintin Buckle, DO;  Location: [facility] ENDOSCOPY;  Service: Gastroenterology;  Laterality: N/A;   JOINT REPLACEMENT  2022   LUMBAR LAMINECTOMY/DECOMPRESSION MICRODISCECTOMY N/A 08/15/2019   Procedure: Lumbar three to Sacral one Decompressive lumbar laminectomy;  Surgeon: Manya Sells, MD;  Location: [facility] OR;  Service: Neurosurgery;  Laterality: N/A;   neck disc surgery     plates and screws in neck x 2   POLYPECTOMY  08/09/2023   Procedure:  POLYPECTOMY;  Surgeon: Quintin Buckle, DO;  Location: [facility] ENDOSCOPY;  Service: Gastroenterology;;   ROTATOR CUFF REPAIR  06/16/2016   right shoulder    ROTATOR CUFF REPAIR     SPINAL CORD STIMULATOR INSERTION N/A 03/17/2021   Procedure: THORACIC SPINAL CORD STIMULATOR (PERCUTANEOUS) & PULSE GENERATOR PLACEMENT;  Surgeon: Berta Brittle, MD;  Location: [facility] ORS;  Service: Neurosurgery;  Laterality: N/A;   SPINE SURGERY  2020   TONSILLECTOMY AND ADENOIDECTOMY  1973   TOTAL HIP ARTHROPLASTY Right 04/15/2021   Procedure: TOTAL HIP ARTHROPLASTY ANTERIOR APPROACH;  Surgeon: Molli Angelucci, MD;  Location: [facility] ORS;  Service: Orthopedics;  Laterality: Right;    Allergies: Allergies as of 11/15/2023 - Review Complete 11/08/2023  Allergen Reaction Noted   Sulfasalazine Other (See Comments) 04/17/2022   Plaquenil [hydroxychloroquine] Rash 04/03/2015    Medications:  Current Facility-Administered Medications:    ceFAZolin  (ANCEF ) IVPB 2g/100 mL premix, 2 g, Intravenous, 60 min Pre-Op, Carroll Clamp, MD   lactated ringers  infusion, , Intravenous, Continuous, Piscitello, Portia Brittle, MD, Last Rate: 10 mL/hr at 01/04/24 8119, New Bag at 01/04/24 1478   vancomycin  (VANCOCIN ) IVPB 1000 mg/200 mL premix, 1,000 mg, Intravenous, Once, Carroll Clamp, MD, Last Rate: 200 mL/hr at 01/04/24 2956, 1,000 mg at 01/04/24 2130  Social History: Social History   Tobacco Use   Smoking status: Former    Current packs/day: 0.00    Average packs/day: 2.0 packs/day for 45.0 years (90.0 ttl pk-yrs)    Types: Cigarettes    Start date: 10/29/1975    Quit date: 10/28/2020    Years since quitting: 3.1   Smokeless tobacco: Never   Tobacco comments:    Quit smoking in 2022- khj 09/24/2023  Vaping Use   Vaping status: Former   Devices: vape occasionally  Substance Use Topics   Alcohol use: No    Comment: recovering alcoholic Since 1994    Drug use: Never    Family Medical History: Family History  Problem  Relation Age of Onset   Alcohol abuse Mother    Bipolar disorder Mother    Suicidality Mother    Arthritis Mother    Early death Mother    Pancreatic cancer Father    Alcohol abuse Father    Cancer Father    Drug abuse Sister    Alcohol abuse Sister    Depression Sister    Alcohol abuse Sister    Anxiety disorder Sister    Anesthesia problems Neg Hx    Breast cancer Neg Hx     Physical Examination: Vitals:   01/04/24 0611  BP: (!) 141/73  Pulse: 66  Resp: 16  Temp: (!) 97.1 F (36.2 C)  SpO2: 98%    General: Patient is in no apparent distress. Attention to examination is appropriate.  Neck:   Supple.  Full range of motion.  Respiratory: Patient is breathing without any difficulty.   NEUROLOGICAL:     Awake, alert, oriented to person, place, and time.  Speech is clear and fluent.   Cranial  Nerves: Pupils equal round and reactive to light.  Facial tone is symmetric.  Facial sensation is symmetric. Shoulder shrug is symmetric. Tongue protrusion is midline.    Strength:  Side Iliopsoas Quads Hamstring PF DF EHL  R 5 5 5 5 5 5   L 5 5 5 5 3 2    Reflexes are split, hyperreflexic on the right and hyperreflexic on the left.  Patchy sensation loss in the bilateral lower extremities  Imaging: CLINICAL DATA: Evaluate for excess motion  EXAM: LUMBAR SPINE - COMPLETE 4+ VIEW  COMPARISON: MRI lumbar spine June 20, 2020, lumbosacral spine August 15, 2019  FINDINGS: Stimulator thoracic spine interest of the L1 level, the tip of the electrodes are T8-T9.  Status post prior laminectomy L4-L5  There is degenerative disc disease with narrowing of the L3-L4 L4-5 L5-S1 disc spaces with grade 1 posterior listhesis of L5 on L4.  Flexion-extension images demonstrates no significant change in the degree of spondylolisthesis  IMPRESSION: *Degenerative disc disease with grade 1 posterior listhesis of L5 on L4. *Stimulator thoracic spine interest of the L1 level,  the tip of the electrodes are T8-T9.   Electronically Signed By: Fredrich Jefferson M.D. On: 11/05/2023 17:22    Narrative & Impression  CLINICAL DATA:  Low back pain with lumbar radiculopathy. Bilateral hip and leg pain.   EXAM: MRI LUMBAR SPINE WITHOUT CONTRAST   TECHNIQUE: Multiplanar, multisequence MR imaging of the lumbar spine was performed. No intravenous contrast was administered.   COMPARISON:  Lumbar spine MRI 06/20/2020.   FINDINGS: Segmentation: Conventional numbering is assumed with 5 non-rib-bearing, lumbar type vertebral bodies.   Alignment:  New grade 1 anterolisthesis of L4 on L5.   Vertebrae: Prior L3-S1 laminectomy. Unchanged chronic, degenerative height loss of the L5 vertebral body.   Conus medullaris and cauda equina: Conus extends to the L2 level. Conus and cauda equina appear normal.   Paraspinal and other soft tissues: Fatty atrophy of the paraspinal muscles. Spinal cord stimulator enters the dorsal epidural space at T11-12.   Disc levels:   T12-L1: Small disc bulge and mild bilateral facet arthropathy. No spinal canal stenosis or neural foraminal narrowing.   L1-L2: Small disc bulge and mild bilateral facet arthropathy. No spinal canal stenosis or neural foraminal narrowing.   L2-L3: Small disc bulge and mild bilateral facet arthropathy. No spinal canal stenosis or neural foraminal narrowing.   L3-L4: Prior laminectomy. Disc bulge with central annular fissure. Mild bilateral facet arthropathy. No spinal canal stenosis or neural foraminal narrowing.   L4-L5: Anterolisthesis with uncovered disc and superimposed disc bulge. Severe bilateral facet arthropathy with joint effusions, periarticular edema, and medially projecting synovial cyst, measuring up to 12 x 4 mm (image 30 series 6). Severe spinal canal stenosis and severe bilateral neural foraminal narrowing, worse from prior.   L5-S1: Prior laminectomy. Small disc bulge and mild  bilateral facet arthropathy. No spinal canal stenosis. Moderate bilateral neural foraminal narrowing.   IMPRESSION: 1. Progression of lower lumbar spondylosis, worst at L4-L5, where there is now severe spinal canal stenosis and severe bilateral neural foraminal narrowing. 2. Moderate bilateral neural foraminal narrowing at L5-S1.     Electronically Signed   By: Audra Blend M.D.   On: 11/03/2022 16:47   I have personally reviewed the images and agree with the above interpretation.  In addition the x-ray shows the IPG in the right flank with clear access to leads and battery.  Medical Decision Making/Assessment and Plan: [name] is a pleasant 68 y.o.  female with history of chronic back pain.  She has had multilevel lumbar laminectomy as well as a thoracic spinal cord stimulator.  She has been having difficulty with her spinal cord stimulator as far as recharging goes.  She like to switch this out to a none rechargeable product so that she can get more useful stimulation on a more regular basis.  She does feel like she is having some worsening of her claudication on history, she does have severe significant stenosis.  For this we will also recommend starting physical therapy and having her follow-up with us  to evaluate for any need for repeat decompression or fusion.  As far as the stimulator goes we can plan on replacing her IPG so she can get more regular treatment.  Will work with her to find a time for scheduling.  Thank you for involving me in the care of this patient.    Carroll Clamp MD/MSCR Neurosurgery

## 2024-01-04 NOTE — Transfer of Care (Signed)
 Immediate Anesthesia Transfer of Care Note  Patient: Gabrielle Pineda  Procedure(s) Performed: SPINAL CORD STIMULATOR BATTERY EXCHANGE (Right: Back)  Patient Location: PACU  Anesthesia Type:General  Level of Consciousness: awake, alert , oriented, and patient cooperative  Airway & Oxygen  Therapy: Patient Spontanous Breathing and Patient connected to face mask oxygen   Post-op Assessment: Report given to RN and Post -op Vital signs reviewed and stable  Post vital signs: Reviewed and stable  Last Vitals:  Vitals Value Taken Time  BP 148/84 01/04/24 0830  Temp 36.3 C 01/04/24 0827  Pulse 91 01/04/24 0833  Resp 17 01/04/24 0833  SpO2 97 % 01/04/24 0833  Vitals shown include unfiled device data.  Last Pain:  Vitals:   01/04/24 0611  TempSrc: Temporal  PainSc: 7          Complications: No notable events documented.

## 2024-01-04 NOTE — Anesthesia Preprocedure Evaluation (Signed)
 Anesthesia Evaluation  Patient identified by MRN, date of birth, ID band Patient awake  General Assessment Comment:  "Difficult airway" flag on chart, but unable to find the record demonstrating this. Previous documented GA showed grade 1 view with mcgrath.  Reviewed: Allergy & Precautions, NPO status , Patient's Chart, lab work & pertinent test results  History of Anesthesia Complications Negative for: history of anesthetic complications  Airway Mallampati: III  TM Distance: <3 FB Neck ROM: Limited    Dental  (+) Edentulous Upper, Edentulous Lower   Pulmonary neg pulmonary ROS, shortness of breath and with exertion, neg sleep apnea, COPD,  COPD inhaler and oxygen  dependent, Patient abstained from smoking.Not current smoker, former smoker 2L/min O2 at night. Stopped smoking 2 years ago    + decreased breath sounds      Cardiovascular Exercise Tolerance: Good METShypertension, Pt. on medications (-) CAD and (-) Past MI (-) dysrhythmias + Valvular Problems/Murmurs  Rhythm:Regular Rate:Normal  TTE 2024: 1. Left ventricular ejection fraction, by estimation, is 60 to 65%. The  left ventricle has normal function. The left ventricle has no regional  wall motion abnormalities. There is mild left ventricular hypertrophy.  Left ventricular diastolic parameters  are consistent with Grade I diastolic dysfunction (impaired relaxation).   2. Right ventricular systolic function is normal. The right ventricular  size is normal.   3. The mitral valve is normal in structure. Mild mitral valve  regurgitation. No evidence of mitral stenosis.   4. The aortic valve has an indeterminant number of cusps. There is mild  calcification of the aortic valve. Aortic valve regurgitation is not  visualized. Aortic valve sclerosis is present, with no evidence of aortic  valve stenosis.   5. The inferior vena cava is normal in size with greater than 50%   respiratory variability, suggesting right atrial pressure of 3 mmHg.     Neuro/Psych  Headaches PSYCHIATRIC DISORDERS Anxiety Depression     Neuromuscular disease negative neurological ROS  negative psych ROS   GI/Hepatic Neg liver ROS,GERD  Medicated,,  Endo/Other  negative endocrine ROSneg diabetes    Renal/GU CRFRenal disease  negative genitourinary   Musculoskeletal   Abdominal   Peds  Hematology negative hematology ROS (+)   Anesthesia Other Findings Past Medical History: No date: Alcohol abuse 1983: Allergy No date: Anemia No date: Anxiety No date: Arthritis No date: Cataract No date: Cervicalgia 1994: Cirrhosis (HCC) No date: COPD (chronic obstructive pulmonary disease) (HCC) No date: Depression No date: Difficult intubation     Comment:  has plates and screws in neck No date: Dyspnea No date: GERD (gastroesophageal reflux disease) No date: Headache No date: Heart murmur     Comment:  on heard when pt is lying No date: Hypertension No date: Neuromuscular disorder (HCC)     Comment:  Neuropathy No date: Other and unspecified hyperlipidemia No date: Oxygen  deficiency No date: Status post insertion of spinal cord stimulator No date: Tachycardia     Comment:  d/t questionable anxiety happens every 5- 10 years, sees              [facility]  Past Surgical History: 2012, 2015, 2018: ANTERIOR CERVICAL DECOMP/DISCECTOMY FUSION     Comment:  x3 1982: AUGMENTATION MAMMAPLASTY; Bilateral 1981: BREAST ENHANCEMENT SURGERY 11/11/2011: CARPAL TUNNEL RELEASE     Comment:  Procedure: CARPAL TUNNEL RELEASE;  Surgeon: Adelbert Adler, MD;  Location: [facility];  Service:               Neurosurgery;  Laterality: Right;  Right Median Nerve               Decompression 2013: CARPAL TUNNEL RELEASE 11/01/2017: COLONOSCOPY WITH PROPOFOL ; N/A     Comment:  Procedure: COLONOSCOPY WITH PROPOFOL ;  Surgeon: Cassie Click, MD;   Location: [facility] ENDOSCOPY;  Service:               Endoscopy;  Laterality: N/A; No date: ESOPHAGOGASTRODUODENOSCOPY 2022: JOINT REPLACEMENT 08/15/2019: LUMBAR LAMINECTOMY/DECOMPRESSION MICRODISCECTOMY; N/A     Comment:  Procedure: Lumbar three to Sacral one Decompressive               lumbar laminectomy;  Surgeon: Manya Sells, MD;                Location: [facility] OR;  Service: Neurosurgery;  Laterality:               N/A; No date: neck disc surgery     Comment:  plates and screws in neck x 2 06/16/2016: ROTATOR CUFF REPAIR     Comment:  right shoulder  No date: ROTATOR CUFF REPAIR 03/17/2021: SPINAL CORD STIMULATOR INSERTION; N/A     Comment:  Procedure: THORACIC SPINAL CORD STIMULATOR               (PERCUTANEOUS) & PULSE GENERATOR PLACEMENT;  Surgeon:               Berta Brittle, MD;  Location: [facility] ORS;  Service:               Neurosurgery;  Laterality: N/A; 2020: SPINE SURGERY 1973: TONSILLECTOMY AND ADENOIDECTOMY 04/15/2021: TOTAL HIP ARTHROPLASTY; Right     Comment:  Procedure: TOTAL HIP ARTHROPLASTY ANTERIOR APPROACH;                Surgeon: Molli Angelucci, MD;  Location: [facility] ORS;                Service: Orthopedics;  Laterality: Right;  BMI    Body Mass Index: 24.81 kg/m      Reproductive/Obstetrics negative OB ROS                             Anesthesia Physical Anesthesia Plan  ASA: 3  Anesthesia Plan: General   Post-op Pain Management: Ofirmev  IV (intra-op)*   Induction: Intravenous  PONV Risk Score and Plan: 4 or greater and Propofol  infusion, TIVA, Ondansetron , Midazolam , Dexamethasone  and Treatment may vary due to age or medical condition  Airway Management Planned: Natural Airway, Nasal Cannula, Oral ETT and Video Laryngoscope Planned  Additional Equipment: None  Intra-op Plan:   Post-operative Plan: Extubation in OR  Informed Consent: I have reviewed the patients History and Physical, chart, labs and discussed the procedure  including the risks, benefits and alternatives for the proposed anesthesia with the patient or authorized representative who has indicated his/her understanding and acceptance.     Dental Advisory Given  Plan Discussed with: Anesthesiologist, CRNA and Surgeon  Anesthesia Plan Comments: (Discussed risks of anesthesia with patient, including PONV, sore throat, lip/dental/eye damage. Rare risks discussed as well, such as cardiorespiratory and neurological sequelae, and allergic reactions. Discussed the role of CRNA in patient's perioperative care. Patient understands. Patient is on chronic opioids at home for pain.)  Anesthesia Quick Evaluation

## 2024-01-04 NOTE — Anesthesia Procedure Notes (Signed)
 Procedure Name: Intubation Date/Time: 01/04/2024 7:47 AM  Performed by: Carolynn Citrin, CRNAPre-anesthesia Checklist: Patient identified, Emergency Drugs available, Suction available, Patient being monitored and Timeout performed Patient Re-evaluated:Patient Re-evaluated prior to induction Oxygen  Delivery Method: Circle system utilized Preoxygenation: Pre-oxygenation with 100% oxygen  Induction Type: IV induction Ventilation: Mask ventilation without difficulty Laryngoscope Size: McGrath and 3 Grade View: Grade I Tube type: Oral Tube size: 6.5 mm Number of attempts: 1 Airway Equipment and Method: Stylet and Video-laryngoscopy Placement Confirmation: ETT inserted through vocal cords under direct vision, positive ETCO2 and breath sounds checked- equal and bilateral Secured at: 20 cm Tube secured with: Tape Dental Injury: Teeth and Oropharynx as per pre-operative assessment

## 2024-01-04 NOTE — Telephone Encounter (Signed)
Pt scheduled on 6/30

## 2024-01-04 NOTE — Anesthesia Postprocedure Evaluation (Signed)
 Anesthesia Post Note  Patient: Gabrielle Pineda  Procedure(s) Performed: SPINAL CORD STIMULATOR BATTERY EXCHANGE (Right: Back)  Patient location during evaluation: PACU Anesthesia Type: General Level of consciousness: awake and alert Pain management: pain level controlled Vital Signs Assessment: post-procedure vital signs reviewed and stable Respiratory status: spontaneous breathing, nonlabored ventilation, respiratory function stable and patient connected to nasal cannula oxygen  Cardiovascular status: blood pressure returned to baseline and stable Postop Assessment: no apparent nausea or vomiting Anesthetic complications: no   No notable events documented.   Last Vitals:  Vitals:   01/04/24 0845 01/04/24 0915  BP: (!) 163/90 (!) 177/91  Pulse: 84 79  Resp: 20 15  Temp:  36.6 C  SpO2: 96% 97%    Last Pain:  Vitals:   01/04/24 0915  TempSrc: Temporal  PainSc: 0-No pain                 Lattie Poli

## 2024-01-05 ENCOUNTER — Encounter: Payer: Self-pay | Admitting: Neurosurgery

## 2024-01-16 NOTE — Progress Notes (Signed)
   REFERRING PHYSICIAN:  Lark Plum [address] [place],  [place] [address]  DOS: 01/04/24  replacement of SCS IPG  HISTORY OF PRESENT ILLNESS: Yael Z Deason is approximately 2 weeks status post above surgery.   She is doing well with minimal incisional pain. SCS has not been turned on yet so she  has her chronic back and leg pain.   She is taking neurontin , mobic , and oxycodone  (on this chronically from pain management).    PHYSICAL EXAMINATION:  General: Patient is well developed, well nourished, calm, collected, and in no apparent distress.   NEUROLOGICAL:  General: In no acute distress.   Awake, alert, oriented to person, place, and time.  Pupils equal round and reactive to light.  Facial tone is symmetric.     Strength:            Side Iliopsoas Quads Hamstring PF DF EHL  R 5 5 5 5 5 5   L 5 5 5 5 5 5    Incision c/d/i   ROS (Neurologic):  Negative except as noted above  IMAGING: Nothing new to review.   ASSESSMENT/PLAN:  ZYNIA WOJTOWICZ is doing well s/p above surgery. Treatment options reviewed with patient and following plan made:   - I have advised the patient to lift up to 10 pounds until 6 weeks after surgery (follow up with Dr. Felipe Horton).  - Reviewed wound care.  - No bending, twisting, or lifting.  - Rep will meet with her today to turn on SCS and work on programming.  - Continue on current medications including oxycodone  from pain management.  - Follow up as scheduled in 4 weeks and prn.   Advised to contact the office if any questions or concerns arise.  Lucetta Russel PA-C Department of neurosurgery

## 2024-01-17 ENCOUNTER — Encounter: Admitting: Orthopedic Surgery

## 2024-01-18 ENCOUNTER — Encounter: Payer: Self-pay | Admitting: Family Medicine

## 2024-01-18 DIAGNOSIS — J209 Acute bronchitis, unspecified: Secondary | ICD-10-CM

## 2024-01-19 MED ORDER — AZITHROMYCIN 250 MG PO TABS: ORAL_TABLET | ORAL | 0 refills | Status: AC

## 2024-01-19 MED ORDER — BENZONATATE 100 MG PO CAPS: 100.0000 mg | ORAL_CAPSULE | Freq: Three times a day (TID) | ORAL | 0 refills | Status: DC | PRN

## 2024-01-19 MED ORDER — METHYLPREDNISOLONE 4 MG PO TBPK: ORAL_TABLET | ORAL | 0 refills | Status: DC

## 2024-01-20 ENCOUNTER — Encounter: Admitting: Student in an Organized Health Care Education/Training Program

## 2024-01-24 ENCOUNTER — Encounter: Payer: Self-pay | Admitting: Nurse Practitioner

## 2024-01-24 ENCOUNTER — Ambulatory Visit: Attending: Nurse Practitioner | Admitting: Nurse Practitioner

## 2024-01-24 VITALS — BP 162/91 | HR 72 | Temp 97.5°F | Resp 16 | Ht 69.0 in | Wt 168.0 lb

## 2024-01-24 DIAGNOSIS — G8929 Other chronic pain: Secondary | ICD-10-CM | POA: Diagnosis present

## 2024-01-24 DIAGNOSIS — M549 Dorsalgia, unspecified: Secondary | ICD-10-CM | POA: Diagnosis not present

## 2024-01-24 DIAGNOSIS — R413 Other amnesia: Secondary | ICD-10-CM | POA: Insufficient documentation

## 2024-01-24 DIAGNOSIS — M961 Postlaminectomy syndrome, not elsewhere classified: Secondary | ICD-10-CM | POA: Insufficient documentation

## 2024-01-24 DIAGNOSIS — M5030 Other cervical disc degeneration, unspecified cervical region: Secondary | ICD-10-CM | POA: Insufficient documentation

## 2024-01-24 DIAGNOSIS — G894 Chronic pain syndrome: Secondary | ICD-10-CM | POA: Insufficient documentation

## 2024-01-24 DIAGNOSIS — M47812 Spondylosis without myelopathy or radiculopathy, cervical region: Secondary | ICD-10-CM | POA: Insufficient documentation

## 2024-01-24 DIAGNOSIS — M48061 Spinal stenosis, lumbar region without neurogenic claudication: Secondary | ICD-10-CM | POA: Insufficient documentation

## 2024-01-24 DIAGNOSIS — M5416 Radiculopathy, lumbar region: Secondary | ICD-10-CM | POA: Insufficient documentation

## 2024-01-24 DIAGNOSIS — Z79899 Other long term (current) drug therapy: Secondary | ICD-10-CM | POA: Diagnosis present

## 2024-01-24 MED ORDER — GABAPENTIN 400 MG PO CAPS: 400.0000 mg | ORAL_CAPSULE | Freq: Every day | ORAL | 5 refills | Status: AC

## 2024-01-24 MED ORDER — TOPIRAMATE 25 MG PO TABS: 25.0000 mg | ORAL_TABLET | Freq: Two times a day (BID) | ORAL | 11 refills | Status: AC

## 2024-01-24 MED ORDER — OXYCODONE-ACETAMINOPHEN 5-325 MG PO TABS: 1.0000 | ORAL_TABLET | Freq: Two times a day (BID) | ORAL | 0 refills | Status: DC | PRN

## 2024-01-24 MED ORDER — OXYCODONE-ACETAMINOPHEN 5-325 MG PO TABS: 1.0000 | ORAL_TABLET | Freq: Two times a day (BID) | ORAL | 0 refills | Status: AC | PRN

## 2024-01-24 NOTE — Progress Notes (Signed)
 Nursing Pain Medication Assessment:  Safety precautions to be maintained throughout the outpatient stay will include: orient to surroundings, keep bed in low position, maintain call bell within reach at all times, provide assistance with transfer out of bed and ambulation.    Medication Inspection Compliance: Pill count conducted under aseptic conditions, in front of the patient. Neither the pills nor the bottle was removed from the patient's sight at any time. Once count was completed pills were immediately returned to the patient in their original bottle.  Medication: Oxycodone /APAP Pill/Patch Count: 31 of 60 pills/patches remain Pill/Patch Appearance: Markings consistent with prescribed medication Bottle Appearance: Standard pharmacy container. Clearly labeled. Filled Date: 05 / 21 / 2025 Last Medication intake:  Today

## 2024-01-24 NOTE — Progress Notes (Signed)
 PROVIDER NOTE: Interpretation of information contained herein should be left to medically-trained personnel. Specific patient instructions are provided elsewhere under "Patient Instructions" section of medical record. This document was created in part using AI and STT-dictation technology, any transcriptional errors that may result from this process are unintentional.  Patient: Gabrielle Pineda  Service: E/M   PCP: Carlean Charter, DO  DOB: [birth year]  DOS: 01/24/2024  Provider: Cherylin Corrigan, NP  MRN: [id number]  Delivery: Face-to-face  Specialty: Interventional Pain Management  Type: Established Patient  Setting: Ambulatory outpatient facility  Specialty designation: 09  Referring Prov.: Carlean Charter, DO  Location: Outpatient office facility       History of present illness (HPI) Gabrielle Pineda, a 68 y.o. year old female, is here today because of her Chronic pain syndrome [G89.4]. Gabrielle Pineda primary complain today is Back Pain (Bilateral )  Pertinent problems: Gabrielle Pineda does not have any pertinent problems on file.  Pain Assessment: Severity of Chronic pain is reported as a 7 /10. Location: Back Lower, Right, Left/Radiates from lower back into hips bilateral down outer thigh tio outer knee bilateral. Onset: More than a month ago. Quality: [place], Shooting, Aching, Constant. Timing: Constant. Modifying factor(s): Medication. Vitals:  height is 5\' 9"  (1.753 m) and weight is 168 lb (76.2 kg). Her temporal temperature is 97.5 F (36.4 C) (abnormal). Her blood pressure is 162/91 (abnormal) and her pulse is 72. Her respiration is 16 and oxygen  saturation is 98%.  BMI: Estimated body mass index is 24.81 kg/m as calculated from the following:   Height as of this encounter: 5\' 9"  (1.753 m).   Weight as of this encounter: 168 lb (76.2 kg).  Last encounter: 10/26/2023 Last procedure: 12/14/2022  Reason for encounter: medication management. No change in medical history since last visit.   Patient's pain is at baseline.  Patient continues multimodal pain regimen as prescribed.  States that it provides pain relief and improvement in functional status.  Pharmacotherapy Assessment   Analgesic: Oxycodone -acetaminophen  (Percocet) 5-325 mg tablet every 12 hours as needed for pain.  MME=15 Gabapentin  (Neurontin ) 400 Mg capsules daily at bedtime Topiramate  (Topamax ) 25 mg tablet 2 times daily  Monitoring: [place] PMP: PDMP reviewed during this encounter.       Pharmacotherapy: No side-effects or adverse reactions reported. Compliance: No problems identified. Effectiveness: Clinically acceptable.  Huston Maiers, RN  01/24/2024  8:31 AM  Sign when Signing Visit Nursing Pain Medication Assessment:  Safety precautions to be maintained throughout the outpatient stay will include: orient to surroundings, keep bed in low position, maintain call bell within reach at all times, provide assistance with transfer out of bed and ambulation.    Medication Inspection Compliance: Pill count conducted under aseptic conditions, in front of the patient. Neither the pills nor the bottle was removed from the patient's sight at any time. Once count was completed pills were immediately returned to the patient in their original bottle.  Medication: Oxycodone /APAP Pill/Patch Count: 31 of 60 pills/patches remain Pill/Patch Appearance: Markings consistent with prescribed medication Bottle Appearance: Standard pharmacy container. Clearly labeled. Filled Date: 05 / 21 / 2025 Last Medication intake:  Today  UDS:  Summary  Date Value Ref Range Status  10/26/2023 FINAL  Final    Comment:    ==================================================================== [employer] 13 (MW) ==================================================================== Test  Result       Flag       Units  Drug Present and Declared for Prescription Verification   Oxycodone                       1313          EXPECTED   ng/mg creat   Oxymorphone                    470          EXPECTED   ng/mg creat   Noroxycodone                   2018         EXPECTED   ng/mg creat   Noroxymorphone                 218          EXPECTED   ng/mg creat    Sources of oxycodone  are scheduled prescription medications.    Oxymorphone, noroxycodone, and noroxymorphone are expected    metabolites of oxycodone . Oxymorphone is also available as a    scheduled prescription medication.  ==================================================================== Test                      Result    Flag   Units      Ref Range   Creatinine              93               mg/dL      >=40 ==================================================================== Declared Medications:  The flagging and interpretation on this report are based on the  following declared medications.  Unexpected results may arise from  inaccuracies in the declared medications.   **Note: The testing scope of this panel includes these medications:   Oxycodone    **Note: The testing scope of this panel does not include the  following reported medications:   Acetaminophen   Acyclovir  (Zovirax )  Albuterol  (Proventil  HFA)  Atogepant  Atorvastatin  (Lipitor)  Azelastine  (Astelin )  Budesonide (Breztri  Aerosphere)  Buspirone  (Buspar )  Carvedilol  (Coreg )  Eye Drops  Ezetimibe  (Zetia )  Fexofenadine (Allegra)  Fluticasone  (Flonase )  Formoterol (Breztri  Aerosphere)  Furosemide  (Lasix )  Gabapentin  (Neurontin )  Glycopyrrolate  (Breztri  Aerosphere)  Hydroxyzine  (Atarax )  Ipratropium (Atrovent )  Leflunomide  (Arava )  Losartan  (Cozaar )  Meloxicam  (Mobic )  Mirtazapine  (Remeron )  Montelukast  (Singulair )  Multivitamin  Omeprazole  (Prilosec)  Potassium (Klor-Con )  Topiramate  (Topamax )  Valacyclovir  (Valtrex )  Venlafaxine  (Effexor ) ==================================================================== For clinical consultation, please call [phone]. ====================================================================     No results found for: "CBDTHCR" No results found for: "D8THCCBX" No results found for: "D9THCCBX"  ROS  Constitutional: Denies any fever or chills Gastrointestinal: No reported hemesis, hematochezia, vomiting, or acute GI distress Musculoskeletal: Lower back pain (bilateral) Neurological: No reported episodes of acute onset apraxia, aphasia, dysarthria, agnosia, amnesia, paralysis, loss of coordination, or loss of consciousness  Medication Review  Lifitegrast , Nebulizer, acyclovir  ointment, albuterol , atorvastatin , azelastine , azithromycin , benzonatate , budesonide-glycopyrrolate -formoterol, busPIRone , carvedilol , ezetimibe , fexofenadine, fluticasone , furosemide , gabapentin , hydrOXYzine , ipratropium, leflunomide , losartan , meloxicam , methylPREDNISolone , mirtazapine , montelukast , multivitamin with minerals, oxyCODONE -acetaminophen , pantoprazole , potassium chloride  SA, topiramate , valACYclovir , and venlafaxine  XR  History Review  Allergy: Gabrielle Pineda is allergic to sulfasalazine and plaquenil [hydroxychloroquine]. Drug: Gabrielle Pineda  reports no history of drug use. Alcohol:  reports no history of alcohol use. Tobacco:  reports that she quit smoking about 3 years ago. Her smoking use included cigarettes.  She started smoking about 48 years ago. She has a 90 pack-year smoking history. She has never used smokeless tobacco. Social: Gabrielle Pineda  reports that she quit smoking about 3 years ago. Her smoking use included cigarettes. She started smoking about 48 years ago. She has a 90 pack-year smoking history. She has never used smokeless tobacco. She reports that she does not drink alcohol and does not use drugs. Medical:  has a past medical history of Alcohol abuse, Allergy (1983), Anemia, Anxiety, Arthritis, Cataract, Cervicalgia, Cirrhosis (HCC) (1994), COPD (chronic obstructive pulmonary disease) (HCC),  Depression, Difficult intubation, Dyspnea, GERD (gastroesophageal reflux disease), Headache, Heart murmur, Hypertension, Neuromuscular disorder (HCC), Other and unspecified hyperlipidemia, Oxygen  deficiency, Status post insertion of spinal cord stimulator, and Tachycardia. Surgical: Gabrielle Pineda  has a past surgical history that includes neck disc surgery; Breast enhancement surgery (1981); Esophagogastroduodenoscopy; Tonsillectomy and adenoidectomy (1973); Carpal tunnel release (11/11/2011); Carpal tunnel release (2013); Rotator cuff repair (06/16/2016); Rotator cuff repair; Colonoscopy with propofol  (N/A, 11/01/2017); Augmentation mammaplasty (Bilateral, 1982); Anterior cervical decomp/discectomy fusion (2012, 2015, 2018); Lumbar laminectomy/decompression microdiscectomy (N/A, 08/15/2019); Spinal cord stimulator insertion (N/A, 03/17/2021); Total hip arthroplasty (Right, 04/15/2021); Joint replacement (2022); Spine surgery (2020); Colonoscopy with propofol  (N/A, 08/09/2023); Esophagogastroduodenoscopy (egd) with propofol  (N/A, 08/09/2023); polypectomy (08/09/2023); and Spinal cord stimulator battery exchange (Right, 01/04/2024). Family: family history includes Alcohol abuse in her father, mother, sister, and sister; Anxiety disorder in her sister; Arthritis in her mother; Bipolar disorder in her mother; Cancer in her father; Depression in her sister; Drug abuse in her sister; Early death in her mother; Pancreatic cancer in her father; Suicidality in her mother.  Laboratory Chemistry Profile   Renal Lab Results  Component Value Date   BUN 19 12/22/2023   CREATININE 1.28 (H) 12/22/2023   BCR 13 11/26/2023   GFRAA 72 05/06/2020   GFRNONAA 46 (L) 12/22/2023    Hepatic Lab Results  Component Value Date   AST 30 07/19/2023   ALT 19 07/19/2023   ALBUMIN 4.2 11/26/2023   ALKPHOS 133 (H) 07/19/2023   LIPASE 63 03/23/2018    Electrolytes Lab Results  Component Value Date   NA 137 12/22/2023   K  4.0 12/22/2023   CL 108 12/22/2023   CALCIUM  8.9 12/22/2023   PHOS 3.9 11/26/2023    Bone Lab Results  Component Value Date   VD25OH 27.4 (L) 11/26/2023    Inflammation (CRP: Acute Phase) (ESR: Chronic Phase) No results found for: "CRP", "ESRSEDRATE", "LATICACIDVEN"       Note: Above Lab results reviewed.  Recent Imaging Review  DG Lumbar Spine Complete CLINICAL DATA:  Evaluate for excess motion  EXAM: LUMBAR SPINE - COMPLETE 4+ VIEW  COMPARISON:  MRI lumbar spine June 20, 2020, lumbosacral spine August 15, 2019  FINDINGS: Stimulator thoracic spine interest of the L1 level, the tip of the electrodes are T8-T9.  Status post prior laminectomy L4-L5  There is degenerative disc disease with narrowing of the L3-L4 L4-5 L5-S1 disc spaces with grade 1 posterior listhesis of L5 on L4.  Flexion-extension images demonstrates no significant change in the degree of spondylolisthesis  IMPRESSION: *Degenerative disc disease with grade 1 posterior listhesis of L5 on L4. *Stimulator thoracic spine interest of the L1 level, the tip of the electrodes are T8-T9.  Electronically Signed   By: Fredrich Jefferson M.D.   On: 11/05/2023 17:22 Note: Reviewed         Physical Exam  General appearance: Well nourished, well developed, and well hydrated. In no  apparent acute distress Mental status: Alert, oriented x 3 (person, place, & time)       Respiratory: No evidence of acute respiratory distress Eyes: PERLA Vitals: BP (!) 162/91 (Patient Position: Sitting, Cuff Size: Normal)   Pulse 72   Temp (!) 97.5 F (36.4 C) (Temporal)   Resp 16   Ht 5\' 9"  (1.753 m)   Wt 168 lb (76.2 kg)   SpO2 98%   BMI 24.81 kg/m  BMI: Estimated body mass index is 24.81 kg/m as calculated from the following:   Height as of this encounter: 5\' 9"  (1.753 m).   Weight as of this encounter: 168 lb (76.2 kg). Ideal: Ideal body weight: 66.2 kg (145 lb 15.1 oz) Adjusted ideal body weight: 70.2 kg (154 lb  12.3 oz)  Assessment   Diagnosis Status  1. Chronic pain syndrome   2. Spondylosis of cervical region without myelopathy or radiculopathy   3. Lumbar foraminal stenosis   4. DDD (degenerative disc disease), cervical   5. Failed back surgical syndrome   6. Chronic radicular lumbar pain   7. Memory loss or impairment   8. Medication management    Controlled Controlled Controlled   Updated Problems: No problems updated.  Plan of Care  Problem-specific:  Assessment and Plan We will continue on current medication regimen.  Prescribing drug monitoring (PDMP) reviewed; findings consistent with the use of prescribed medication and no evidence of narcotic misuse or abuse.  Urine drug screening (UDS) up to date.  Schedule follow-up in 90 days for medication management.  No other new issues or problems reported to this visit.  Gabrielle Pineda has a current medication list which includes the following long-term medication(s): albuterol , atorvastatin , azelastine , carvedilol , ezetimibe , fexofenadine, fluticasone , furosemide , ipratropium, mirtazapine , montelukast , potassium chloride  sa, venlafaxine  xr, gabapentin , and topiramate .  Pharmacotherapy (Medications Ordered): Meds ordered this encounter  Medications   oxyCODONE -acetaminophen  (PERCOCET) 5-325 MG tablet    Sig: Take 1 tablet by mouth every 12 (twelve) hours as needed for severe pain (pain score 7-10). Must last 30 days.    Dispense:  60 tablet    Refill:  0    Chronic Pain: STOP Act (Not applicable) Fill 1 day early if closed on refill date. Avoid benzodiazepines within 8 hours of opioids   oxyCODONE -acetaminophen  (PERCOCET) 5-325 MG tablet    Sig: Take 1 tablet by mouth every 12 (twelve) hours as needed for severe pain (pain score 7-10). Must last 30 days.    Dispense:  60 tablet    Refill:  0    Chronic Pain: STOP Act (Not applicable) Fill 1 day early if closed on refill date. Avoid benzodiazepines within 8 hours of opioids    oxyCODONE -acetaminophen  (PERCOCET) 5-325 MG tablet    Sig: Take 1 tablet by mouth every 12 (twelve) hours as needed for severe pain (pain score 7-10). Must last 30 days.    Dispense:  60 tablet    Refill:  0    Chronic Pain: STOP Act (Not applicable) Fill 1 day early if closed on refill date. Avoid benzodiazepines within 8 hours of opioids   topiramate  (TOPAMAX ) 25 MG tablet    Sig: Take 1 tablet (25 mg total) by mouth 2 (two) times daily.    Dispense:  60 tablet    Refill:  11   gabapentin  (NEURONTIN ) 400 MG capsule    Sig: Take 1 capsule (400 mg total) by mouth at bedtime.    Dispense:  30 capsule  Refill:  5   Orders:  No orders of the defined types were placed in this encounter.       Return in about 3 months (around 04/25/2024) for (F2F), (MM), Marthe Slain NP.    Recent Visits Date Type Provider Dept  10/26/23 Office Visit Cephus Collin, MD [facility]  Showing recent visits within past 90 days and meeting all other requirements Today's Visits Date Type Provider Dept  01/24/24 Office Visit Lilou Kneip K, NP [facility]  Showing today's visits and meeting all other requirements Future Appointments No visits were found meeting these conditions. Showing future appointments within next 90 days and meeting all other requirements  I discussed the assessment and treatment plan with the patient. The patient was provided an opportunity to ask questions and all were answered. The patient agreed with the plan and demonstrated an understanding of the instructions.  Patient advised to call back or seek an in-person evaluation if the symptoms or condition worsens.  Duration of encounter: 30 minutes.  Total time on encounter, as per AMA guidelines included both the face-to-face and non-face-to-face time personally spent by the physician and/or other qualified health care professional(s) on the day of the encounter (includes time in activities that require the  physician or other qualified health care professional and does not include time in activities normally performed by clinical staff). Physician's time may include the following activities when performed: Preparing to see the patient (e.g., pre-charting review of records, searching for previously ordered imaging, lab work, and nerve conduction tests) Review of prior analgesic pharmacotherapies. Reviewing PMP Interpreting ordered tests (e.g., lab work, imaging, nerve conduction tests) Performing post-procedure evaluations, including interpretation of diagnostic procedures Obtaining and/or reviewing separately obtained history Performing a medically appropriate examination and/or evaluation Counseling and educating the patient/family/caregiver Ordering medications, tests, or procedures Referring and communicating with other health care professionals (when not separately reported) Documenting clinical information in the electronic or other health record Independently interpreting results (not separately reported) and communicating results to the patient/ family/caregiver Care coordination (not separately reported)  Note by: Chase Knebel K Amariz Flamenco, NP (TTS and AI technology used. I apologize for any typographical errors that were not detected and corrected.) Date: 01/24/2024; Time: 8:49 AM

## 2024-01-25 ENCOUNTER — Encounter: Payer: Self-pay | Admitting: Orthopedic Surgery

## 2024-01-25 ENCOUNTER — Ambulatory Visit (INDEPENDENT_AMBULATORY_CARE_PROVIDER_SITE_OTHER): Admitting: Orthopedic Surgery

## 2024-01-25 VITALS — BP 128/84 | Temp 98.1°F | Ht 69.0 in | Wt 168.0 lb

## 2024-01-25 DIAGNOSIS — Z09 Encounter for follow-up examination after completed treatment for conditions other than malignant neoplasm: Secondary | ICD-10-CM

## 2024-01-25 DIAGNOSIS — G894 Chronic pain syndrome: Secondary | ICD-10-CM

## 2024-01-25 DIAGNOSIS — Z4542 Encounter for adjustment and management of neuropacemaker (brain) (peripheral nerve) (spinal cord): Secondary | ICD-10-CM

## 2024-01-31 ENCOUNTER — Ambulatory Visit: Admitting: Physician Assistant

## 2024-01-31 ENCOUNTER — Other Ambulatory Visit: Payer: Self-pay | Admitting: Cardiovascular Disease

## 2024-01-31 ENCOUNTER — Encounter: Payer: Self-pay | Admitting: Physician Assistant

## 2024-01-31 VITALS — BP 155/97 | HR 74 | Resp 16 | Ht 69.0 in | Wt 169.0 lb

## 2024-01-31 DIAGNOSIS — I10 Essential (primary) hypertension: Secondary | ICD-10-CM

## 2024-01-31 DIAGNOSIS — R0989 Other specified symptoms and signs involving the circulatory and respiratory systems: Secondary | ICD-10-CM | POA: Diagnosis not present

## 2024-01-31 NOTE — Progress Notes (Signed)
 Established patient visit  Patient: Gabrielle Pineda   DOB: 1955/12/07   68 y.o. Female  MRN: [id number] Visit Date: 01/31/2024  Today's healthcare provider: Blane Bunting, PA-C   Chief Complaint  Patient presents with   Follow-up    F/u No other concerns   Subjective     HPI     Follow-up    Additional comments: F/u No other concerns      Last edited by Estill Hemming, CMA on 01/31/2024  3:41 PM.       Discussed the use of AI scribe software for clinical note transcription with the patient, who gave verbal consent to proceed.  History of Present Illness Gabrielle Pineda is a 68 year old female who presents with congestion and fluid in the ears.  She experiences congestion and fluid in the ears. Previous treatment with an antibiotic, a steroid, and cough medication alleviated her symptoms significantly. Some congestion persists. She had post-nasal drainage and chest congestion, but the chest congestion has mostly resolved.       01/24/2024    8:29 AM 11/26/2023    9:35 AM 10/19/2023    3:35 PM  Depression screen PHQ 2/9  Decreased Interest 0 0   Down, Depressed, Hopeless 0 0   PHQ - 2 Score 0 0   Altered sleeping  0   Tired, decreased energy  0   Change in appetite  0   Feeling bad or failure about yourself   0   Trouble concentrating  0   Moving slowly or fidgety/restless  0   Suicidal thoughts  0   PHQ-9 Score  0      Information is confidential and restricted. Go to Review Flowsheets to unlock data.      11/26/2023    9:36 AM 10/19/2023    3:35 PM 07/19/2023    9:48 AM 06/21/2023    1:19 PM  GAD 7 : Generalized Anxiety Score  Nervous, Anxious, on Edge 0  0   Control/stop worrying 0  0   Worry too much - different things 0  0   Trouble relaxing 0  0   Restless 0  0   Easily annoyed or irritable 0  0   Afraid - awful might happen 0  0   Total GAD 7 Score 0  0   Anxiety Difficulty Not difficult at all  Not difficult at all      Information is  confidential and restricted. Go to Review Flowsheets to unlock data.    Medications: Outpatient Medications Prior to Visit  Medication Sig   acyclovir  ointment (ZOVIRAX ) 5 % :Topical Every 3 Hours as needed for lesion   albuterol  (VENTOLIN  HFA) 108 (90 Base) MCG/ACT inhaler INHALE TWO (2) PUFFS BY MOUTH AND INTO THE LUNGS EVERY 6 HOURS AS NEEDED FOR SHORTNESS OF BREATH   atorvastatin  (LIPITOR) 40 MG tablet Take 1 tablet (40 mg total) by mouth daily.   azelastine  (ASTELIN ) 0.1 % nasal spray Place 2 sprays into both nostrils 2 (two) times daily. Use in each nostril as directed (Patient taking differently: Place 2 sprays into both nostrils daily. Use in each nostril as directed)   Budeson-Glycopyrrol-Formoterol (BREZTRI  AEROSPHERE) 160-9-4.8 MCG/ACT AERO Inhale 2 puffs into the lungs in the morning and at bedtime.   busPIRone  (BUSPAR ) 7.5 MG tablet TAKE 1 TABLET BY MOUTH TWICE DAILY   carvedilol  (COREG ) 12.5 MG tablet TAKE ONE TABLET BY MOUTH AT BREAKFAST AND AT BEDTIME   ezetimibe  (ZETIA )  10 MG tablet TAKE 1 TABLET BY MOUTH ONCE DAILY   fexofenadine (ALLEGRA) 180 MG tablet Take 180 mg by mouth daily.   fluticasone  (FLONASE ) 50 MCG/ACT nasal spray Place 2 sprays into both nostrils in the morning and at bedtime. (Patient taking differently: Place 2 sprays into both nostrils daily.)   furosemide  (LASIX ) 20 MG tablet TAKE 1 TABLET BY MOUTH ONCE DAILY (Patient taking differently: Take 20 mg by mouth daily as needed for fluid or edema.)   gabapentin  (NEURONTIN ) 400 MG capsule Take 1 capsule (400 mg total) by mouth at bedtime.   hydrOXYzine  (ATARAX ) 10 MG tablet TAKE 1-2 TABLETS BY MOUTH TWICE DAILY AS NEEDED FOR SEVERE ANXIETY ATTACKS ONLY   ipratropium (ATROVENT ) 0.03 % nasal spray Place 2 sprays into both nostrils 3 (three) times daily as needed for rhinitis.   leflunomide  (ARAVA ) 20 MG tablet Take 20 mg by mouth at bedtime.   losartan  (COZAAR ) 25 MG tablet Take 25 mg by mouth daily.   meloxicam   (MOBIC ) 15 MG tablet Take 15 mg by mouth at bedtime.   mirtazapine  (REMERON ) 15 MG tablet TAKE 1 TABLET BY MOUTH EVERY DAY AT BEDTIME   montelukast  (SINGULAIR ) 10 MG tablet TAKE 1 TABLET BY MOUTH EVERY DAY AT BEDTIME   Multiple Vitamin (MULTIVITAMIN WITH MINERALS) TABS tablet Take 1 tablet by mouth daily.   Nebulizer MISC Compressor nebulizer to use with nebulized medications as instructed   [START ON 02/04/2024] oxyCODONE -acetaminophen  (PERCOCET) 5-325 MG tablet Take 1 tablet by mouth every 12 (twelve) hours as needed for severe pain (pain score 7-10). Must last 30 days.   [START ON 03/05/2024] oxyCODONE -acetaminophen  (PERCOCET) 5-325 MG tablet Take 1 tablet by mouth every 12 (twelve) hours as needed for severe pain (pain score 7-10). Must last 30 days.   [START ON 04/04/2024] oxyCODONE -acetaminophen  (PERCOCET) 5-325 MG tablet Take 1 tablet by mouth every 12 (twelve) hours as needed for severe pain (pain score 7-10). Must last 30 days.   pantoprazole  (PROTONIX ) 40 MG tablet Take 40 mg by mouth daily.   potassium chloride  SA (KLOR-CON  M) 20 MEQ tablet Take 1 tablet (20 mEq total) by mouth every morning.   topiramate  (TOPAMAX ) 25 MG tablet Take 1 tablet (25 mg total) by mouth 2 (two) times daily.   valACYclovir  (VALTREX ) 1000 MG tablet Take 1 tablet (1,000 mg total) by mouth daily. (Patient taking differently: Take 1,000 mg by mouth daily as needed (cold sores).)   venlafaxine  XR (EFFEXOR -XR) 75 MG 24 hr capsule TAKE 2 CAPSULES BY MOUTH DAILY WITH BREAKFAST *DOSE CHANGE*   XIIDRA  5 % SOLN Place 1 drop into both eyes daily as needed (dry eyes).   benzonatate  (TESSALON ) 100 MG capsule Take 1 capsule (100 mg total) by mouth 3 (three) times daily as needed for cough.   No facility-administered medications prior to visit.    Review of Systems All negative Except see HPI       Objective    BP (!) 146/76 (BP Location: Left Arm, Patient Position: Sitting, Cuff Size: Normal)   Pulse 74   Resp 16    Ht 5' 9 (1.753 m)   Wt 169 lb (76.7 kg)   SpO2 98%   BMI 24.96 kg/m     Physical Exam Vitals reviewed.  Constitutional:      Appearance: She is normal weight.  HENT:     Head: Normocephalic and atraumatic.     Right Ear: Ear canal and external ear normal.     Left Ear:  Ear canal and external ear normal.     Nose: Congestion and rhinorrhea present.     Mouth/Throat:     Pharynx: Posterior oropharyngeal erythema present.     Comments: Postnasal drainage noted  Eyes:     General: No scleral icterus.       Right eye: No discharge.        Left eye: No discharge.     Extraocular Movements: Extraocular movements intact.     Pupils: Pupils are equal, round, and reactive to light.    Cardiovascular:     Rate and Rhythm: Normal rate and regular rhythm.  Pulmonary:     Effort: Pulmonary effort is normal.     Breath sounds: Normal breath sounds.  Abdominal:     General: Abdomen is flat. Bowel sounds are normal.     Palpations: Abdomen is soft.  Lymphadenopathy:     Cervical: No cervical adenopathy.   Neurological:     Mental Status: She is alert.      No results found for any visits on 01/31/24.      Assessment & Plan Upper Respiratory Tract Infection Symptoms improved with previous treatment. Inflammation noted. Could be due to allergy/pollen - Continue Zyrtec. - Use nasal saline spray followed by Flonase . - Report worsening symptoms for potential further treatment. Will follow-up  Hypertension Chronic and unstable Blood pressure monitoring required. - Recheck blood pressure before end of visit. Continue taking carvedilol  12.5 BID, furosemide  20mg  Continue low salt diet and daily exercise Will follow-up  No orders of the defined types were placed in this encounter.   No follow-ups on file.   The patient was advised to call back or seek an in-person evaluation if the symptoms worsen or if the condition fails to improve as anticipated.  I discussed the  assessment and treatment plan with the patient. The patient was provided an opportunity to ask questions and all were answered. The patient agreed with the plan and demonstrated an understanding of the instructions.  I, Yaira Bernardi, PA-C have reviewed all documentation for this visit. The documentation on 01/31/2024  for the exam, diagnosis, procedures, and orders are all accurate and complete.  Blane Bunting, [facility], MMS [facility] [phone] (phone) [phone] (fax)  [facility]

## 2024-02-04 ENCOUNTER — Ambulatory Visit: Admitting: Podiatry

## 2024-02-06 ENCOUNTER — Encounter: Payer: Self-pay | Admitting: Student in an Organized Health Care Education/Training Program

## 2024-02-06 ENCOUNTER — Encounter: Payer: Self-pay | Admitting: Physician Assistant

## 2024-02-08 ENCOUNTER — Ambulatory Visit
Admission: RE | Admit: 2024-02-08 | Discharge: 2024-02-08 | Disposition: A | Discharge status: Home / Self Care | Source: Ambulatory Visit | Attending: Neurosurgery | Admitting: Neurosurgery

## 2024-02-08 ENCOUNTER — Encounter: Payer: Self-pay | Admitting: Neurosurgery

## 2024-02-08 DIAGNOSIS — M4712 Other spondylosis with myelopathy, cervical region: Secondary | ICD-10-CM | POA: Diagnosis present

## 2024-02-10 ENCOUNTER — Encounter: Payer: Self-pay | Admitting: Podiatry

## 2024-02-10 ENCOUNTER — Ambulatory Visit: Admitting: Podiatry

## 2024-02-10 DIAGNOSIS — G629 Polyneuropathy, unspecified: Secondary | ICD-10-CM

## 2024-02-10 DIAGNOSIS — B351 Tinea unguium: Secondary | ICD-10-CM

## 2024-02-10 DIAGNOSIS — M79675 Pain in left toe(s): Secondary | ICD-10-CM | POA: Diagnosis not present

## 2024-02-10 DIAGNOSIS — M79674 Pain in right toe(s): Secondary | ICD-10-CM | POA: Diagnosis not present

## 2024-02-11 ENCOUNTER — Ambulatory Visit: Admitting: Physician Assistant

## 2024-02-13 NOTE — Progress Notes (Signed)
  Subjective:  Patient ID: Gabrielle Pineda, female    DOB: Sep 05, 1955,  MRN: [id number]  Gabrielle Pineda presents to clinic today for at risk foot care with history of peripheral neuropathy and corn(s) left lower extremity and painful mycotic toenails that are difficult to trim. Painful toenails interfere with ambulation. Aggravating factors include wearing enclosed shoe gear. Pain is relieved with periodic professional debridement. Painful corns are aggravated when weightbearing when wearing enclosed shoe gear. Pain is relieved with periodic professional debridement.  Chief Complaint  Patient presents with   Nail Problem    Thick painful toenails, 3 month follow up   New problem(s): None.   PCP is [name], DO.LOV 11/26/2023.  Allergies  Allergen Reactions   Sulfasalazine Other (See Comments)    Does not remember reaction    Plaquenil [Hydroxychloroquine] Rash    Review of Systems: Negative except as noted in the HPI.  Objective: No changes noted in today's physical examination. There were no vitals filed for this visit. Gabrielle Pineda is a pleasant 68 y.o. female WD, WN in NAD. AAO x 3.  Vascular Examination: Capillary refill time immediate b/l. Palpable pedal pulses. No pain with calf compression b/l. Skin temperature gradient WNL b/l. No cyanosis or clubbing b/l. No ischemia or gangrene noted b/l. Pedal hair absent.  Neurological Examination: Sensation grossly intact b/l with 10 gram monofilament. Vibratory sensation intact b/l. Pt has subjective symptoms of neuropathy.  Dermatological Examination: Pedal skin with normal turgor, texture and tone b/l.  No open wounds. No interdigital macerations.   Toenails 1-5 b/l thick, discolored, elongated with subungual debris and pain on dorsal palpation.   Hyperkeratotic lesion(s) dorsal PIPJ of L 4th toe.  No erythema, no edema, no drainage, no fluctuance.  Musculoskeletal Examination: Muscle strength 5/5 to all lower  extremity muscle groups bilaterally. HAV with bunion deformity noted b/l LE. Severe hammertoe deformity noted 2-5 bilaterally.  Radiographs: None  Assessment/Plan: 1. Pain due to onychomycosis of toenails of both feet   2. Neuropathy   Patient was evaluated and treated. All patient's and/or POA's questions/concerns addressed on today's visit. Mycotic toenails 1-5 debrided in length and girth without incident. Continue soft, supportive shoe gear daily. Report any pedal injuries to medical professional. Call office if there are any quesitons/concerns. -Patient/POA to call should there be question/concern in the interim.   Return in about 3 months (around 05/12/2024).  Delon LITTIE Merlin, DPM      [place] LOCATION: [address] [place] [address]                   Office [phone]   [facility] LOCATION: [address] [place], [name] Office [phone]
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
## 2024-02-14 ENCOUNTER — Encounter: Payer: Self-pay | Admitting: Cardiovascular Disease

## 2024-02-14 ENCOUNTER — Ambulatory Visit: Admitting: Neurosurgery

## 2024-02-14 ENCOUNTER — Ambulatory Visit: Attending: Cardiovascular Disease | Admitting: Cardiovascular Disease

## 2024-02-14 ENCOUNTER — Encounter: Payer: Self-pay | Admitting: Neurosurgery

## 2024-02-14 VITALS — BP 132/80 | Temp 98.4°F | Ht 69.0 in | Wt 168.0 lb

## 2024-02-14 VITALS — BP 128/80 | HR 71 | Ht 69.0 in | Wt 168.2 lb

## 2024-02-14 DIAGNOSIS — R011 Cardiac murmur, unspecified: Secondary | ICD-10-CM | POA: Diagnosis not present

## 2024-02-14 DIAGNOSIS — Z4542 Encounter for adjustment and management of neuropacemaker (brain) (peripheral nerve) (spinal cord): Secondary | ICD-10-CM

## 2024-02-14 DIAGNOSIS — M48062 Spinal stenosis, lumbar region with neurogenic claudication: Secondary | ICD-10-CM

## 2024-02-14 DIAGNOSIS — N1832 Chronic kidney disease, stage 3b: Secondary | ICD-10-CM

## 2024-02-14 DIAGNOSIS — M961 Postlaminectomy syndrome, not elsewhere classified: Secondary | ICD-10-CM

## 2024-02-14 DIAGNOSIS — R0602 Shortness of breath: Secondary | ICD-10-CM

## 2024-02-14 DIAGNOSIS — I471 Supraventricular tachycardia, unspecified: Secondary | ICD-10-CM | POA: Diagnosis not present

## 2024-02-14 DIAGNOSIS — I10 Essential (primary) hypertension: Secondary | ICD-10-CM | POA: Diagnosis not present

## 2024-02-14 DIAGNOSIS — I050 Rheumatic mitral stenosis: Secondary | ICD-10-CM

## 2024-02-14 MED ORDER — ATORVASTATIN CALCIUM 40 MG PO TABS: 40.0000 mg | ORAL_TABLET | Freq: Every day | ORAL | 3 refills | Status: AC

## 2024-02-14 MED ORDER — LOSARTAN POTASSIUM 25 MG PO TABS: 25.0000 mg | ORAL_TABLET | Freq: Every day | ORAL | 3 refills | Status: AC

## 2024-02-14 MED ORDER — CARVEDILOL 12.5 MG PO TABS: ORAL_TABLET | ORAL | 3 refills | Status: AC

## 2024-02-14 MED ORDER — EZETIMIBE 10 MG PO TABS: 10.0000 mg | ORAL_TABLET | Freq: Every day | ORAL | 3 refills | Status: AC

## 2024-02-14 NOTE — Progress Notes (Signed)
   REFERRING PHYSICIAN:  Donzella Lauraine LOISE Rosalea [address] [place],  [name]  DOS: 01/04/24  replacement of SCS IPG  HISTORY OF PRESENT ILLNESS: Etna Z Gehlhausen is approximately  weeks status post above surgery.  She is healing well.  Working on her programming.  Continuing to have significant stenosis and low back pain.  Claudicatory symptoms as well.  PHYSICAL EXAMINATION:  General: Patient is well developed, well nourished, calm, collected, and in no apparent distress.   NEUROLOGICAL:  General: In no acute distress.   Awake, alert, oriented to person, place, and time.  Pupils equal round and reactive to light.  Facial tone is symmetric.     Strength:            Side Iliopsoas Quads Hamstring PF DF EHL  R 5 5 5 5 2 2   L 5 5 5 5 5 5    Incision c/d/i   ROS (Neurologic):  Negative except as noted above  IMAGING: Nothing new to review.   ASSESSMENT/PLAN:  DONN WILMOT is doing well s/p above surgery. Treatment options reviewed with patient and following plan made:   - Rep will meet with her today to turn on SCS and work on programming.  - In regards to her ongoing back pain and claudicatory symptoms as well as right lower extremity pain we did discuss that she does have a grade 1-2 spondylolisthesis which is causing some significant stenosis.  Would like to have her continue to heal from the spinal cord stimulator battery replacement at which time we can discuss whether or not she would be a candidate for going forward for spinal fusion given the slip and stenosis.  We would order a updated bone density scan to check on her bone density as she did have some osteopenia at her last evaluation.  Advised to contact the office if any questions or concerns arise.  [name] Sharps, MD Department of neurosurgery

## 2024-02-14 NOTE — Patient Instructions (Signed)

## 2024-02-23 ENCOUNTER — Other Ambulatory Visit: Payer: Self-pay | Admitting: Family Medicine

## 2024-02-23 DIAGNOSIS — Z1231 Encounter for screening mammogram for malignant neoplasm of breast: Secondary | ICD-10-CM

## 2024-03-01 ENCOUNTER — Other Ambulatory Visit: Payer: Self-pay | Admitting: Psychiatry

## 2024-03-01 DIAGNOSIS — F411 Generalized anxiety disorder: Secondary | ICD-10-CM

## 2024-03-01 DIAGNOSIS — F3342 Major depressive disorder, recurrent, in full remission: Secondary | ICD-10-CM

## 2024-03-07 NOTE — Telephone Encounter (Signed)
 She can see Lyle or I in clinic for evaluation in clinic. Please call and schedule her a follow up.

## 2024-03-08 NOTE — Telephone Encounter (Signed)
 Patient scheduled.

## 2024-03-13 ENCOUNTER — Ambulatory Visit
Admission: RE | Admit: 2024-03-13 | Discharge: 2024-03-13 | Disposition: A | Discharge status: Home / Self Care | Attending: Physician Assistant | Admitting: Physician Assistant

## 2024-03-13 ENCOUNTER — Encounter: Payer: Self-pay | Admitting: Physician Assistant

## 2024-03-13 ENCOUNTER — Ambulatory Visit
Admission: RE | Admit: 2024-03-13 | Discharge: 2024-03-13 | Disposition: A | Discharge status: Home / Self Care | Source: Ambulatory Visit | Attending: Physician Assistant | Admitting: Physician Assistant

## 2024-03-13 ENCOUNTER — Ambulatory Visit: Admitting: Physician Assistant

## 2024-03-13 VITALS — BP 136/92 | Ht 69.0 in | Wt 169.0 lb

## 2024-03-13 DIAGNOSIS — M48062 Spinal stenosis, lumbar region with neurogenic claudication: Secondary | ICD-10-CM

## 2024-03-13 DIAGNOSIS — Z09 Encounter for follow-up examination after completed treatment for conditions other than malignant neoplasm: Secondary | ICD-10-CM

## 2024-03-13 DIAGNOSIS — G894 Chronic pain syndrome: Secondary | ICD-10-CM

## 2024-03-13 MED ORDER — TIZANIDINE HCL 4 MG PO TABS: 2.0000 mg | ORAL_TABLET | Freq: Three times a day (TID) | ORAL | 1 refills | Status: DC | PRN

## 2024-03-13 NOTE — Progress Notes (Signed)
 REFERRING PHYSICIAN:  Donzella Lauraine LOISE Rosalea [address] [place],  [name]  DOS: 01/04/24  replacement of SCS IPG  HISTORY OF PRESENT ILLNESS: Gabrielle Pineda is approximately 9 weeks status post above surgery.  She is healing well.  Working on her programming.  Continuing to have significant stenosis and low back pain.  Claudicatory symptoms as well.  She feels like her low back pain is becoming severe in nature over the past 2 months.  She is concerned because she is having new pain that extends down her right leg into the top of her foot that she feels though is getting worse.    PHYSICAL EXAMINATION:  General: Patient is well developed, well nourished, calm, collected, and in no apparent distress.   NEUROLOGICAL:  General: In no acute distress.   Awake, alert, oriented to person, place, and time.  Pupils equal round and reactive to light.  Facial tone is symmetric.    - SLR  2+ bilatel patella, 1+ achilles rihgt, 2 + left   Strength:            Side Iliopsoas Quads Hamstring PF DF EHL  R 5 5 5 5 2 2   L 5 5 5 5 5 5    Incision c/d/i   ROS (Neurologic):  Negative except as noted above  IMAGING: EXAM: MRI CERVICAL SPINE WITHOUT CONTRAST   TECHNIQUE: Multiplanar, multisequence MR imaging of the cervical spine was performed. No intravenous contrast was administered.   COMPARISON:  Cervical myelogram CT 10/22/2016. Radiographs 05/24/2023.   FINDINGS: Technical note: Despite efforts by the technologist and patient, mild motion artifact is present on today's exam and could not be eliminated. This reduces exam sensitivity and specificity.   Alignment: Chronic straightening of the usual cervical lordosis. Minimal anterolisthesis at T2-3.   Vertebrae: Patient is status post extensive anterior cervical discectomy and fusion extending from C3 through T1 as correlated with the recent radiographs. No evidence of acute fracture, discitis or aggressive  osseous lesion. The interbody fusion appears solid from C3 through C6 and at T7-T1. Solid interbody fusion at C6-7 not definitely demonstrated.   Cord: New mild cord flattening at C2-3 without abnormal cord signal. The cervical cord otherwise appears unremarkable.   Posterior Fossa, vertebral arteries, paraspinal tissues: Visualized portions of the posterior fossa appear unremarkable.Bilateral vertebral artery flow voids. No significant paraspinal findings.   Disc levels:   C1-2: Synovial thickening around the odontoid process without resulting mass effect on the cervical cord. Lateral mass atlantoaxial degenerative changes bilaterally.   C2-3: Relatively preserved disc height with chronic disc bulging and a left-sided disc osteophyte complex. Increased posterior element hypertrophy with resulting effacement of the CSF surrounding the cord, mild cord flattening and narrowing of the AP diameter of the canal to 5 mm. Similar mild right and moderate left foraminal narrowing.   C3-4: Stable postsurgical changes with solid interbody fusion. No spinal stenosis or significant foraminal narrowing.   C4-5: Stable postsurgical changes with solid interbody fusion. No spinal stenosis or significant foraminal narrowing.   C5-6: Stable postsurgical changes with solid interbody fusion. No spinal stenosis or significant foraminal narrowing.   C6-7: Stable postsurgical changes with residual low T2 signal within the disc space. No spinal stenosis. Mild foraminal narrowing bilaterally appears chronic.   C7-T1: Stable postsurgical changes with solid interbody fusion. No spinal stenosis. Mild foraminal narrowing bilaterally.   T1-2: Mild disc bulging and bilateral facet hypertrophy. Mild foraminal narrowing bilaterally without definite nerve root  impingement. The spinal canal is patent.   T2-3: Disc bulging and bilateral facet hypertrophy contribute to mild foraminal narrowing bilaterally. No  cord deformity.   IMPRESSION: 1. Stable postsurgical changes from C3 through T1 with solid interbody fusion from C3 through C6 and at T7-T1. Solid fusion not definitively demonstrated at C6-7. 2. No residual spinal stenosis at the operative levels. Mild foraminal narrowing bilaterally at C6-7 and C7-T1. 3. Increased posterior element hypertrophy at C2-3 with resulting moderate spinal stenosis and mild cord flattening. No abnormal cord signal. Similar mild right and moderate left foraminal narrowing at this level. 4. Mild spondylosis at T1-2 and T2-3 without resulting spinal stenosis or cord deformity.  ASSESSMENT/PLAN:  Gabrielle Pineda is doing well s/p above surgery. Treatment options reviewed with patient and following plan made:   - Advised patient to continue discussing with wrap regarding SCS programming and optimization - In regards to her ongoing back pain and claudicatory symptoms as well as right lower extremity pain we did discuss that she does have a grade 1-2 spondylolisthesis which is causing some significant stenosis.  Bone density scan has been ordered and is scheduled to be done next week.  However considering patient's extreme pain, would like updated x-rays today and new MRI given new right lower extremity symptoms.  Advised patient to continue physical therapy.  Plan for next appoint with Dr. Claudene to potentially discuss further surgery.  Advised to contact the office if any questions or concerns arise.  Lyle Decamp, PA-C Department of neurosurgery

## 2024-03-20 ENCOUNTER — Ambulatory Visit
Admission: RE | Admit: 2024-03-20 | Discharge: 2024-03-20 | Disposition: A | Discharge status: Home / Self Care | Source: Ambulatory Visit | Attending: Physician Assistant | Admitting: Physician Assistant

## 2024-03-20 DIAGNOSIS — M4807 Spinal stenosis, lumbosacral region: Secondary | ICD-10-CM | POA: Diagnosis not present

## 2024-03-20 DIAGNOSIS — M4808 Spinal stenosis, sacral and sacrococcygeal region: Secondary | ICD-10-CM | POA: Diagnosis not present

## 2024-03-20 DIAGNOSIS — M48062 Spinal stenosis, lumbar region with neurogenic claudication: Secondary | ICD-10-CM | POA: Insufficient documentation

## 2024-03-20 DIAGNOSIS — M48061 Spinal stenosis, lumbar region without neurogenic claudication: Secondary | ICD-10-CM | POA: Diagnosis not present

## 2024-03-20 DIAGNOSIS — M4319 Spondylolisthesis, multiple sites in spine: Secondary | ICD-10-CM | POA: Diagnosis not present

## 2024-03-21 ENCOUNTER — Ambulatory Visit
Admission: RE | Admit: 2024-03-21 | Discharge: 2024-03-21 | Disposition: A | Discharge status: Home / Self Care | Source: Ambulatory Visit | Attending: Family Medicine | Admitting: Family Medicine

## 2024-03-21 ENCOUNTER — Ambulatory Visit
Admission: RE | Admit: 2024-03-21 | Discharge: 2024-03-21 | Disposition: A | Discharge status: Home / Self Care | Source: Ambulatory Visit | Attending: Neurosurgery | Admitting: Neurosurgery

## 2024-03-21 DIAGNOSIS — M8589 Other specified disorders of bone density and structure, multiple sites: Secondary | ICD-10-CM | POA: Diagnosis not present

## 2024-03-21 DIAGNOSIS — M48062 Spinal stenosis, lumbar region with neurogenic claudication: Secondary | ICD-10-CM | POA: Insufficient documentation

## 2024-03-21 DIAGNOSIS — Z13820 Encounter for screening for osteoporosis: Secondary | ICD-10-CM | POA: Diagnosis not present

## 2024-03-21 DIAGNOSIS — Z7952 Long term (current) use of systemic steroids: Secondary | ICD-10-CM | POA: Diagnosis not present

## 2024-03-21 DIAGNOSIS — Z1231 Encounter for screening mammogram for malignant neoplasm of breast: Secondary | ICD-10-CM | POA: Insufficient documentation

## 2024-03-21 DIAGNOSIS — M961 Postlaminectomy syndrome, not elsewhere classified: Secondary | ICD-10-CM

## 2024-03-21 DIAGNOSIS — R928 Other abnormal and inconclusive findings on diagnostic imaging of breast: Secondary | ICD-10-CM | POA: Insufficient documentation

## 2024-03-21 DIAGNOSIS — Z9882 Breast implant status: Secondary | ICD-10-CM | POA: Insufficient documentation

## 2024-03-21 DIAGNOSIS — Z780 Asymptomatic menopausal state: Secondary | ICD-10-CM | POA: Diagnosis not present

## 2024-03-23 ENCOUNTER — Other Ambulatory Visit: Payer: Self-pay | Admitting: Family Medicine

## 2024-03-23 ENCOUNTER — Ambulatory Visit: Payer: Self-pay | Admitting: Neurosurgery

## 2024-03-23 DIAGNOSIS — R928 Other abnormal and inconclusive findings on diagnostic imaging of breast: Secondary | ICD-10-CM

## 2024-03-23 DIAGNOSIS — M6281 Muscle weakness (generalized): Secondary | ICD-10-CM | POA: Diagnosis not present

## 2024-03-23 DIAGNOSIS — M5459 Other low back pain: Secondary | ICD-10-CM | POA: Diagnosis not present

## 2024-03-27 ENCOUNTER — Ambulatory Visit
Admission: RE | Admit: 2024-03-27 | Discharge: 2024-03-27 | Disposition: A | Discharge status: Home / Self Care | Source: Ambulatory Visit | Attending: Family Medicine | Admitting: Family Medicine

## 2024-03-27 DIAGNOSIS — R928 Other abnormal and inconclusive findings on diagnostic imaging of breast: Secondary | ICD-10-CM | POA: Diagnosis not present

## 2024-03-27 DIAGNOSIS — R92322 Mammographic fibroglandular density, left breast: Secondary | ICD-10-CM | POA: Diagnosis not present

## 2024-03-28 DIAGNOSIS — M6281 Muscle weakness (generalized): Secondary | ICD-10-CM | POA: Diagnosis not present

## 2024-03-28 DIAGNOSIS — M5459 Other low back pain: Secondary | ICD-10-CM | POA: Diagnosis not present

## 2024-03-30 ENCOUNTER — Other Ambulatory Visit: Payer: Self-pay | Admitting: Family Medicine

## 2024-03-30 DIAGNOSIS — J449 Chronic obstructive pulmonary disease, unspecified: Secondary | ICD-10-CM

## 2024-03-31 ENCOUNTER — Telehealth: Payer: Self-pay | Admitting: Family Medicine

## 2024-03-31 ENCOUNTER — Other Ambulatory Visit: Payer: Self-pay

## 2024-03-31 DIAGNOSIS — B001 Herpesviral vesicular dermatitis: Secondary | ICD-10-CM

## 2024-03-31 NOTE — Telephone Encounter (Signed)
 Converted for refill

## 2024-03-31 NOTE — Telephone Encounter (Signed)
 Normally I would say this was too soon but it is only 10 pills per month.  Last filled in April # 10 x 11

## 2024-03-31 NOTE — Telephone Encounter (Signed)
 [facility] faxed refill request for the following medications:   valACYclovir (VALTREX) 1000 MG tablet    Please advise

## 2024-04-03 ENCOUNTER — Ambulatory Visit: Payer: Self-pay | Admitting: Family Medicine

## 2024-04-03 MED ORDER — VALACYCLOVIR HCL 1 G PO TABS: ORAL_TABLET | ORAL | 11 refills | Status: AC

## 2024-04-07 DIAGNOSIS — H04123 Dry eye syndrome of bilateral lacrimal glands: Secondary | ICD-10-CM | POA: Diagnosis not present

## 2024-04-07 DIAGNOSIS — H5203 Hypermetropia, bilateral: Secondary | ICD-10-CM | POA: Diagnosis not present

## 2024-04-07 DIAGNOSIS — H2513 Age-related nuclear cataract, bilateral: Secondary | ICD-10-CM | POA: Diagnosis not present

## 2024-04-07 DIAGNOSIS — H524 Presbyopia: Secondary | ICD-10-CM | POA: Diagnosis not present

## 2024-04-07 DIAGNOSIS — H25013 Cortical age-related cataract, bilateral: Secondary | ICD-10-CM | POA: Diagnosis not present

## 2024-04-12 ENCOUNTER — Ambulatory Visit: Admitting: Neurosurgery

## 2024-04-12 VITALS — BP 130/82 | Ht 69.0 in | Wt 170.4 lb

## 2024-04-12 DIAGNOSIS — M48062 Spinal stenosis, lumbar region with neurogenic claudication: Secondary | ICD-10-CM

## 2024-04-12 DIAGNOSIS — Z9682 Presence of neurostimulator: Secondary | ICD-10-CM

## 2024-04-12 DIAGNOSIS — M8580 Other specified disorders of bone density and structure, unspecified site: Secondary | ICD-10-CM

## 2024-04-12 DIAGNOSIS — Z4542 Encounter for adjustment and management of neuropacemaker (brain) (peripheral nerve) (spinal cord): Secondary | ICD-10-CM

## 2024-04-12 DIAGNOSIS — M21371 Foot drop, right foot: Secondary | ICD-10-CM

## 2024-04-12 DIAGNOSIS — M4316 Spondylolisthesis, lumbar region: Secondary | ICD-10-CM | POA: Diagnosis not present

## 2024-04-16 NOTE — Progress Notes (Signed)
 REFERRING PHYSICIAN:  Donzella Lauraine LOISE Pineda [address] [place],  [name]  DOS: 01/04/24  replacement of SCS IPG  HISTORY OF PRESENT ILLNESS: Gabrielle Pineda is approximately 3 months status post above surgery.  She is healing well.  Working on her programming.  Continuing to have significant stenosis and low back pain.  Claudicatory symptoms as well.  She feels like her low back pain is becoming severe in nature and worsening over the past 3 months.  She is concerned because she is having new pain that extends down her right leg into the top of her foot that she feels though is getting worse.  She has since been discharged from physical therapy.  PHYSICAL EXAMINATION:  General: Patient is well developed, well nourished, calm, collected, and in no apparent distress.   NEUROLOGICAL:  General: In no acute distress.   Awake, alert, oriented to person, place, and time.  Pupils equal round and reactive to light.  Facial tone is symmetric.    - SLR  2+ bilatel patella, 1+ achilles rihgt, 2 + left   Strength:            Side Iliopsoas Quads Hamstring PF DF EHL  R 5 5 5 5 2 2   L 5 5 5 5 5 5    Incision c/d/i   ROS (Neurologic):  Negative except as noted above  IMAGING: EXAM: MRI CERVICAL SPINE WITHOUT CONTRAST   TECHNIQUE: Multiplanar, multisequence MR imaging of the cervical spine was performed. No intravenous contrast was administered.   COMPARISON:  Cervical myelogram CT 10/22/2016. Radiographs 05/24/2023.   FINDINGS: Technical note: Despite efforts by the technologist and patient, mild motion artifact is present on today's exam and could not be eliminated. This reduces exam sensitivity and specificity.   Alignment: Chronic straightening of the usual cervical lordosis. Minimal anterolisthesis at T2-3.   Vertebrae: Patient is status post extensive anterior cervical discectomy and fusion extending from C3 through T1 as correlated with the recent  radiographs. No evidence of acute fracture, discitis or aggressive osseous lesion. The interbody fusion appears solid from C3 through C6 and at T7-T1. Solid interbody fusion at C6-7 not definitely demonstrated.   Cord: New mild cord flattening at C2-3 without abnormal cord signal. The cervical cord otherwise appears unremarkable.   Posterior Fossa, vertebral arteries, paraspinal tissues: Visualized portions of the posterior fossa appear unremarkable.Bilateral vertebral artery flow voids. No significant paraspinal findings.   Disc levels:   C1-2: Synovial thickening around the odontoid process without resulting mass effect on the cervical cord. Lateral mass atlantoaxial degenerative changes bilaterally.   C2-3: Relatively preserved disc height with chronic disc bulging and a left-sided disc osteophyte complex. Increased posterior element hypertrophy with resulting effacement of the CSF surrounding the cord, mild cord flattening and narrowing of the AP diameter of the canal to 5 mm. Similar mild right and moderate left foraminal narrowing.   C3-4: Stable postsurgical changes with solid interbody fusion. No spinal stenosis or significant foraminal narrowing.   C4-5: Stable postsurgical changes with solid interbody fusion. No spinal stenosis or significant foraminal narrowing.   C5-6: Stable postsurgical changes with solid interbody fusion. No spinal stenosis or significant foraminal narrowing.   C6-7: Stable postsurgical changes with residual low T2 signal within the disc space. No spinal stenosis. Mild foraminal narrowing bilaterally appears chronic.   C7-T1: Stable postsurgical changes with solid interbody fusion. No spinal stenosis. Mild foraminal narrowing bilaterally.   T1-2: Mild disc bulging and bilateral facet  hypertrophy. Mild foraminal narrowing bilaterally without definite nerve root impingement. The spinal canal is patent.   T2-3: Disc bulging and bilateral facet  hypertrophy contribute to mild foraminal narrowing bilaterally. No cord deformity.   IMPRESSION: 1. Stable postsurgical changes from C3 through T1 with solid interbody fusion from C3 through C6 and at T7-T1. Solid fusion not definitively demonstrated at C6-7. 2. No residual spinal stenosis at the operative levels. Mild foraminal narrowing bilaterally at C6-7 and C7-T1. 3. Increased posterior element hypertrophy at C2-3 with resulting moderate spinal stenosis and mild cord flattening. No abnormal cord signal. Similar mild right and moderate left foraminal narrowing at this level. 4. Mild spondylosis at T1-2 and T2-3 without resulting spinal stenosis or cord deformity.  Narrative & Impression  EXAM: DUAL X-RAY ABSORPTIOMETRY (DXA) FOR BONE MINERAL DENSITY 03/21/2024 11:24 am   CLINICAL DATA:  68 year old Female Postmenopausal.   TECHNIQUE: An axial (e.g., hips, spine) and/or appendicular (e.g., radius) exam was performed, as appropriate, using GE Secretary/administrator at [facility]. Images are obtained for bone mineral density measurement and are not obtained for diagnostic purposes. [id number]   Exclusions: Lumbar spine due to degenerative changes, right hip due to replacement   COMPARISON:  12/23/2021   FINDINGS: Scan quality: Good.   LEFT FEMORAL NECK:   BMD (in g/cm2): 0.866   T-score: -1.2   Z-score: 0.3   LEFT TOTAL HIP:   BMD (in g/cm2): 0.833   T-score: -1.4   Z-score: -0.1   Rate of change from previous exam: No significant rate of change from previous exam.   RIGHT FOREARM (RADIUS 33%):   BMD (in g/cm2): 0.723   T-score: -1.7   Z-score: -0.2   Rate of change from previous exam: -5.6 %   FRAX 10-YEAR PROBABILITY OF FRACTURE:   10-year fracture risk is performed using the [facility] FRAX calculator based on patient-reported risk factors.   Major osteoporotic fracture: 18.6% Hip fracture: 2.1%   Other  situations known to alter the reliability of the FRAX score should be considered when making treatment decisions, including chronic glucocorticoid use and past treatments. Further guidance on treatment can be found at the [facility]'s website [email or web address].[email or web address].com/.   IMPRESSION: Osteopenia based on BMD.   Fracture risk is increased.   RECOMMENDATIONS: 1. All patients should optimize calcium  and vitamin D  intake.   2. Consider FDA-approved medical therapies in postmenopausal women and men aged 63 years and older, based on the following:   - A hip or vertebral (clinical or morphometric) fracture   - T-score less than or equal to -2.5 and secondary causes have been excluded.   - Low bone mass (T-score between -1.0 and -2.5) and a 10-year probability of a hip fracture greater than or equal to 3% or a 10-year probability of a major osteoporosis-related fracture greater than or equal to 20% based on the [place] -adapted WHO algorithm.   - Clinician judgment and/or patient preferences may indicate treatment for people with 10-year fracture probabilities above or below these levels   3. Patients with diagnosis of osteoporosis or at high risk for fracture should have regular bone mineral density tests. For patients eligible for Medicare, routine testing is allowed once every 2 years. The testing frequency can be increased to one year for patients who have rapidly progressing disease, those who are receiving or discontinuing medical therapy to restore bone mass, or have additional risk factors.     Electronically Signed   By: Reyes  Hu M.D.   On: 03/22/2024 15:23     IMPRESSION: 1. Previous midline posterior decompression of L3-L4 through L5-S1. Very severe degeneration at both L4-L5 and L5-S1, with chronic grade 1 to 2 spondylolisthesis at the former. 2. Severe L4-L5 spinal and right greater than left lateral recess stenosis in part related to a chronic synovial  cyst asymmetric to the right. And moderate to severe biforaminal stenosis. Query right L4 and/or L5 radiculitis. This level appears mildly progressed from an MRI last year. 3. L5-S1 severe chronic disc, bulky endplate and facet degeneration with stable mild spinal but moderate lateral recess stenosis and moderate to severe biforaminal stenosis. 4. Partially visible thoracic spinal stimulator device.     Electronically Signed   By: VEAR Hurst M.D.   On: 03/27/2024 08:31  ASSESSMENT/PLAN:  Gabrielle Pineda is doing well SCS battery exchange with spinal cord stimulator optimization.  She continues to have back pain and claudicatory symptoms and right lower extremity pain.  Has a grade 1-2 spondylolisthesis with significant stenosis.  Has had a DEXA scan with osteopenia but not osteoporosis.  She has x-rays and a new MRI showing continued worsening lumbar stenosis.  Her foot drop is right-sided, her lower extremity/back pain continues to be right-sided and nature.  Has had greater than 6 weeks of physical therapy.  She is not currently using any nicotine .  States that her pain is approximately 8 out of 10.  Interfering with her quality life.  We discussed that the best approach for decompression for her would be a right sided L4-5 transforaminal lumbar interbody fusion, this would give direct decompression of the 4 5 foramen as well as decompress the lateral recess.  This would also help address her spondylolisthesis.   [name] Sharps, MD Department of neurosurgery

## 2024-04-18 DIAGNOSIS — M0579 Rheumatoid arthritis with rheumatoid factor of multiple sites without organ or systems involvement: Secondary | ICD-10-CM | POA: Diagnosis not present

## 2024-04-18 DIAGNOSIS — D509 Iron deficiency anemia, unspecified: Secondary | ICD-10-CM | POA: Diagnosis not present

## 2024-04-18 DIAGNOSIS — Z79899 Other long term (current) drug therapy: Secondary | ICD-10-CM | POA: Diagnosis not present

## 2024-04-21 ENCOUNTER — Other Ambulatory Visit: Payer: Self-pay

## 2024-04-21 ENCOUNTER — Telehealth: Payer: Self-pay | Admitting: Family Medicine

## 2024-04-21 DIAGNOSIS — B001 Herpesviral vesicular dermatitis: Secondary | ICD-10-CM

## 2024-04-21 NOTE — Telephone Encounter (Signed)
 [facility] faxed refill request for the following medications:  valACYclovir  (VALTREX ) 1000 MG tablet    Please advise.

## 2024-04-21 NOTE — Telephone Encounter (Signed)
 Converted

## 2024-04-25 ENCOUNTER — Ambulatory Visit: Attending: Nurse Practitioner | Admitting: Nurse Practitioner

## 2024-04-25 ENCOUNTER — Encounter: Payer: Self-pay | Admitting: Nurse Practitioner

## 2024-04-25 ENCOUNTER — Telehealth: Admitting: Psychiatry

## 2024-04-25 ENCOUNTER — Encounter: Payer: Self-pay | Admitting: Psychiatry

## 2024-04-25 VITALS — BP 191/92 | Temp 97.9°F | Ht 67.0 in | Wt 170.0 lb

## 2024-04-25 DIAGNOSIS — G4700 Insomnia, unspecified: Secondary | ICD-10-CM | POA: Diagnosis not present

## 2024-04-25 DIAGNOSIS — F5101 Primary insomnia: Secondary | ICD-10-CM

## 2024-04-25 DIAGNOSIS — F1021 Alcohol dependence, in remission: Secondary | ICD-10-CM | POA: Diagnosis not present

## 2024-04-25 DIAGNOSIS — M48061 Spinal stenosis, lumbar region without neurogenic claudication: Secondary | ICD-10-CM | POA: Insufficient documentation

## 2024-04-25 DIAGNOSIS — M47812 Spondylosis without myelopathy or radiculopathy, cervical region: Secondary | ICD-10-CM | POA: Diagnosis not present

## 2024-04-25 DIAGNOSIS — F3342 Major depressive disorder, recurrent, in full remission: Secondary | ICD-10-CM | POA: Diagnosis not present

## 2024-04-25 DIAGNOSIS — M5416 Radiculopathy, lumbar region: Secondary | ICD-10-CM | POA: Insufficient documentation

## 2024-04-25 DIAGNOSIS — G894 Chronic pain syndrome: Secondary | ICD-10-CM | POA: Diagnosis not present

## 2024-04-25 DIAGNOSIS — G8929 Other chronic pain: Secondary | ICD-10-CM | POA: Diagnosis not present

## 2024-04-25 DIAGNOSIS — F411 Generalized anxiety disorder: Secondary | ICD-10-CM

## 2024-04-25 DIAGNOSIS — Z79899 Other long term (current) drug therapy: Secondary | ICD-10-CM | POA: Diagnosis not present

## 2024-04-25 DIAGNOSIS — M5030 Other cervical disc degeneration, unspecified cervical region: Secondary | ICD-10-CM | POA: Insufficient documentation

## 2024-04-25 DIAGNOSIS — M961 Postlaminectomy syndrome, not elsewhere classified: Secondary | ICD-10-CM | POA: Diagnosis not present

## 2024-04-25 MED ORDER — OXYCODONE-ACETAMINOPHEN 5-325 MG PO TABS: 1.0000 | ORAL_TABLET | Freq: Two times a day (BID) | ORAL | 0 refills | Status: DC | PRN

## 2024-04-25 NOTE — Progress Notes (Signed)
 Virtual Visit via Video Note  I connected with Gabrielle Pineda on 04/25/24 at 10:30 AM EDT by a video enabled telemedicine application and verified that I am speaking with the correct person using two identifiers.  Location Provider Location : [facility] Patient Location : Home  Participants: Patient , Provider   I discussed the limitations of evaluation and management by telemedicine and the availability of in person appointments. The patient expressed understanding and agreed to proceed.   I discussed the assessment and treatment plan with the patient. The patient was provided an opportunity to ask questions and all were answered. The patient agreed with the plan and demonstrated an understanding of the instructions.   The patient was advised to call back or seek an in-person evaluation if the symptoms worsen or if the condition fails to improve as anticipated.  [facility] MD OP Progress Note  04/25/2024 10:52 AM Gabrielle Pineda  MRN:  [id number]  Chief Complaint:  Chief Complaint  Patient presents with   Follow-up   Anxiety   Depression   Medication Refill   Discussed the use of AI scribe software for clinical note transcription with the patient, who gave verbal consent to proceed.  History of Present Illness Gabrielle Pineda is a 68 year old Caucasian female, on disability, lives in [place], has a history of MDD, GAD, insomnia, alcoholism in remission, history of back pain, history of spinal cord stimulator, paroxysmal ventricular tachycardia, hypercholesterolemia, chronic pain syndrome, GERD was evaluated by telemedicine today.  Since her last visit, she reports feeling fine and good regarding her mood, anxiety, and depression. She denies any thoughts of harming herself or others and denies experiencing psychosis or auditory hallucinations.  She continues to have ongoing sleep difficulties, with her typically obtaining 6 to 7 hours of sleep per night but not feeling rested in the  morning. She identifies going to bed late, often around 11:00 or 11:30 PM, and her husband's television use in their shared bedroom as contributing factors. She follows a set sleep routine and uses sleep hygiene techniques nightly, including winding down before bed and turning off electronics, but she continues to experience non-restorative sleep.  She does have chronic back pain and is scheduled for an evaluation for possible back surgery.  Pain likely also contributing to sleep problems.  She takes venlafaxine  150 mg and mirtazapine  15 mg as prescribed and denies any side effects from these medications. She confirms discontinuing Buspar , as she was previously asked to hold it due to headaches or side effects.  She denies any other concerns today.    Visit Diagnosis:    ICD-10-CM   1. Primary insomnia  F51.01     2. MDD (major depressive disorder), recurrent, in full remission (HCC)  F33.42     3. GAD (generalized anxiety disorder)  F41.1     4. Alcohol use disorder, moderate, in sustained remission (HCC)  F10.21       Past Psychiatric History: I have reviewed past psychiatric history from progress note on 12/26/2021.  Past trials of medications like sertraline , BuSpar , multiple others does not remember all the names.  1 suicide attempt when she was a teenager-overdosed on aspirin.  Past Medical History:  Past Medical History:  Diagnosis Date   Alcohol abuse    Allergy 1983   Anemia    Anxiety    Arthritis    Cataract    Cervicalgia    Cirrhosis (HCC) 1994   COPD (chronic obstructive pulmonary disease) (HCC)  wears O2 at night   Depression    Difficult intubation    has plates and screws in neck   Dyspnea    GERD (gastroesophageal reflux disease)    Headache    Heart murmur    on heard when pt is lying   Hypertension    Neuromuscular disorder (HCC)    Neuropathy   Other and unspecified hyperlipidemia    Oxygen  deficiency    Status post insertion of spinal cord  stimulator    Tachycardia    d/t questionable anxiety happens every 5- 10 years, sees [facility]    Past Surgical History:  Procedure Laterality Date   ANTERIOR CERVICAL DECOMP/DISCECTOMY FUSION  2012, 2015, 2018   x3   AUGMENTATION MAMMAPLASTY Bilateral 1982   BREAST ENHANCEMENT SURGERY  1981   CARPAL TUNNEL RELEASE  11/11/2011   Procedure: CARPAL TUNNEL RELEASE;  Surgeon: Catalina CHRISTELLA Stains, MD;  Location: [facility];  Service: Neurosurgery;  Laterality: Right;  Right Median Nerve Decompression   CARPAL TUNNEL RELEASE  2013   COLONOSCOPY WITH PROPOFOL  N/A 11/01/2017   Procedure: COLONOSCOPY WITH PROPOFOL ;  Surgeon: Viktoria Lamar DASEN, MD;  Location: [facility] ENDOSCOPY;  Service: Endoscopy;  Laterality: N/A;   COLONOSCOPY WITH PROPOFOL  N/A 08/09/2023   Procedure: COLONOSCOPY WITH PROPOFOL ;  Surgeon: Onita Elspeth Sharper, DO;  Location: [facility] ENDOSCOPY;  Service: Gastroenterology;  Laterality: N/A;   ESOPHAGOGASTRODUODENOSCOPY     ESOPHAGOGASTRODUODENOSCOPY (EGD) WITH PROPOFOL  N/A 08/09/2023   Procedure: ESOPHAGOGASTRODUODENOSCOPY (EGD) WITH PROPOFOL ;  Surgeon: Onita Elspeth Sharper, DO;  Location: [facility] ENDOSCOPY;  Service: Gastroenterology;  Laterality: N/A;   JOINT REPLACEMENT  2022   LUMBAR LAMINECTOMY/DECOMPRESSION MICRODISCECTOMY N/A 08/15/2019   Procedure: Lumbar three to Sacral one Decompressive lumbar laminectomy;  Surgeon: Unice Pac, MD;  Location: [facility] OR;  Service: Neurosurgery;  Laterality: N/A;   neck disc surgery     plates and screws in neck x 2   POLYPECTOMY  08/09/2023   Procedure: POLYPECTOMY;  Surgeon: Onita Elspeth Sharper, DO;  Location: [facility] ENDOSCOPY;  Service: Gastroenterology;;   ROTATOR CUFF REPAIR  06/16/2016   right shoulder    ROTATOR CUFF REPAIR     SPINAL CORD STIMULATOR BATTERY EXCHANGE Right 01/04/2024   Procedure: SPINAL CORD STIMULATOR BATTERY EXCHANGE;  Surgeon: Claudene Penne ORN, MD;  Location: [facility] ORS;  Service: Neurosurgery;  Laterality:  Right;  Revision Internal Pulse Generator for Spinal Cord Stimulator (Non-rechargeable)   SPINAL CORD STIMULATOR INSERTION N/A 03/17/2021   Procedure: THORACIC SPINAL CORD STIMULATOR (PERCUTANEOUS) & PULSE GENERATOR PLACEMENT;  Surgeon: Bluford Elspeth, MD;  Location: [facility] ORS;  Service: Neurosurgery;  Laterality: N/A;   SPINE SURGERY  2020   TONSILLECTOMY AND ADENOIDECTOMY  1973   TOTAL HIP ARTHROPLASTY Right 04/15/2021   Procedure: TOTAL HIP ARTHROPLASTY ANTERIOR APPROACH;  Surgeon: Kathlynn Sharper, MD;  Location: [facility] ORS;  Service: Orthopedics;  Laterality: Right;    Family Psychiatric History: I have reviewed family psychiatric history from progress note on 12/26/2021.  Family History:  Family History  Problem Relation Age of Onset   Alcohol abuse Mother    Bipolar disorder Mother    Suicidality Mother    Arthritis Mother    Early death Mother    Pancreatic cancer Father    Alcohol abuse Father    Cancer Father    Drug abuse Sister    Alcohol abuse Sister    Depression Sister    Alcohol abuse Sister    Anxiety disorder Sister    Anesthesia problems Neg  Hx    Breast cancer Neg Hx     Social History: I have reviewed social history from progress note on 12/26/2021. Social History   Socioeconomic History   Marital status: Married    Spouse name: jerry   Number of children: 0   Years of education: Not on file   Highest education level: Bachelor's degree (e.g., BA, AB, BS)  Occupational History   Not on file  Tobacco Use   Smoking status: Former    Current packs/day: 0.00    Average packs/day: 2.0 packs/day for 45.0 years (90.0 ttl pk-yrs)    Types: Cigarettes    Start date: 10/29/1975    Quit date: 10/28/2020    Years since quitting: 3.4   Smokeless tobacco: Never   Tobacco comments:    Quit smoking in 2022- khj 09/24/2023  Vaping Use   Vaping status: Former   Devices: vape occasionally  Substance and Sexual Activity   Alcohol use: No    Comment: recovering alcoholic  Since 1994    Drug use: Never   Sexual activity: Not Currently    Birth control/protection: Abstinence  Other Topics Concern   Not on file  Social History Narrative   Married; full time; does not get regular exercise.    Social Drivers of Corporate investment banker Strain: Low Risk  (01/27/2024)   Overall Financial Resource Strain (CARDIA)    Difficulty of Paying Living Expenses: Not very hard  Food Insecurity: No Food Insecurity (01/27/2024)   Hunger Vital Sign    Worried About Running Out of Food in the Last Year: Never true    Ran Out of Food in the Last Year: Never true  Transportation Needs: No Transportation Needs (01/27/2024)   PRAPARE - Administrator, Civil Service (Medical): No    Lack of Transportation (Non-Medical): No  Physical Activity: Insufficiently Active (01/27/2024)   Exercise Vital Sign    Days of Exercise per Week: 2 days    Minutes of Exercise per Session: 50 min  Stress: No Stress Concern Present (01/27/2024)   [employer] of Occupational Health - Occupational Stress Questionnaire    Feeling of Stress: Not at all  Social Connections: Moderately Isolated (01/27/2024)   Social Connection and Isolation Panel    Frequency of Communication with Friends and Family: More than three times a week    Frequency of Social Gatherings with Friends and Family: More than three times a week    Attends Religious Services: Never    Database administrator or Organizations: No    Attends Engineer, structural: Not on file    Marital Status: Married    Allergies:  Allergies  Allergen Reactions   Sulfasalazine Other (See Comments)    Does not remember reaction    Plaquenil [Hydroxychloroquine] Rash    Metabolic Disorder Labs: Lab Results  Component Value Date   HGBA1C 5.3 11/23/2022   No results found for: PROLACTIN Lab Results  Component Value Date   CHOL 184 07/19/2023   TRIG 156 (H) 07/19/2023   HDL 60 07/19/2023   CHOLHDL 3.1  07/19/2023   LDLCALC 97 07/19/2023   LDLCALC 78 11/23/2022   Lab Results  Component Value Date   TSH 3.673 03/10/2022   TSH 2.170 11/17/2021    Therapeutic Level Labs: No results found for: LITHIUM No results found for: VALPROATE No results found for: CBMZ  Current Medications: Current Outpatient Medications  Medication Sig Dispense Refill   acyclovir  ointment (  ZOVIRAX ) 5 % :Topical Every 3 Hours as needed for lesion 30 g 1   albuterol  (VENTOLIN  HFA) 108 (90 Base) MCG/ACT inhaler INHALE TWO (2) PUFFS BY MOUTH AND INTO THE LUNGS EVERY 6 HOURS AS NEEDED FOR SHORTNESS OF BREATH 8.5 g 10   atorvastatin  (LIPITOR) 40 MG tablet Take 1 tablet (40 mg total) by mouth daily. 90 tablet 3   azelastine  (ASTELIN ) 0.1 % nasal spray Place 2 sprays into both nostrils 2 (two) times daily. Use in each nostril as directed 30 mL 1   Budeson-Glycopyrrol-Formoterol (BREZTRI  AEROSPHERE) 160-9-4.8 MCG/ACT AERO Inhale 2 puffs into the lungs in the morning and at bedtime. 3 each 3   carvedilol  (COREG ) 12.5 MG tablet TAKE ONE TABLET BY MOUTH AT BREAKFAST AND AT BEDTIME 180 tablet 3   ezetimibe  (ZETIA ) 10 MG tablet Take 1 tablet (10 mg total) by mouth daily. 90 tablet 3   fexofenadine (ALLEGRA) 180 MG tablet Take 180 mg by mouth daily.     fluticasone  (FLONASE ) 50 MCG/ACT nasal spray Place 2 sprays into both nostrils in the morning and at bedtime. 16 g 1   furosemide  (LASIX ) 20 MG tablet TAKE 1 TABLET BY MOUTH ONCE DAILY 30 tablet 2   gabapentin  (NEURONTIN ) 400 MG capsule Take 1 capsule (400 mg total) by mouth at bedtime. 30 capsule 5   ipratropium (ATROVENT ) 0.03 % nasal spray Place 2 sprays into both nostrils 3 (three) times daily as needed for rhinitis. 30 mL 1   leflunomide  (ARAVA ) 20 MG tablet Take 20 mg by mouth at bedtime.     losartan  (COZAAR ) 25 MG tablet Take 1 tablet (25 mg total) by mouth daily. 90 tablet 3   meloxicam  (MOBIC ) 15 MG tablet Take 15 mg by mouth at bedtime.     mirtazapine   (REMERON ) 15 MG tablet TAKE 1 TABLET BY MOUTH EVERY DAY AT BEDTIME 30 tablet 10   montelukast  (SINGULAIR ) 10 MG tablet TAKE 1 TABLET BY MOUTH EVERY DAY AT BEDTIME 30 tablet 11   Multiple Vitamin (MULTIVITAMIN WITH MINERALS) TABS tablet Take 1 tablet by mouth daily.     Nebulizer MISC Compressor nebulizer to use with nebulized medications as instructed 1 each 0   [START ON 05/08/2024] oxyCODONE -acetaminophen  (PERCOCET) 5-325 MG tablet Take 1 tablet by mouth every 12 (twelve) hours as needed for severe pain (pain score 7-10). Must last 30 days. 60 tablet 0   [START ON 06/07/2024] oxyCODONE -acetaminophen  (PERCOCET) 5-325 MG tablet Take 1 tablet by mouth every 12 (twelve) hours as needed for severe pain (pain score 7-10). Must last 30 days. 60 tablet 0   [START ON 07/07/2024] oxyCODONE -acetaminophen  (PERCOCET) 5-325 MG tablet Take 1 tablet by mouth every 12 (twelve) hours as needed for severe pain (pain score 7-10). Must last 30 days. 60 tablet 0   pantoprazole  (PROTONIX ) 40 MG tablet Take 40 mg by mouth daily.     potassium chloride  SA (KLOR-CON  M) 20 MEQ tablet Take 1 tablet (20 mEq total) by mouth every morning. 90 tablet 1   tiZANidine  (ZANAFLEX ) 4 MG tablet Take 0.5-1 tablets (2-4 mg total) by mouth every 8 (eight) hours as needed for muscle spasms. 90 tablet 1   topiramate  (TOPAMAX ) 25 MG tablet Take 1 tablet (25 mg total) by mouth 2 (two) times daily. 60 tablet 11   valACYclovir  (VALTREX ) 1000 MG tablet Take 2 tablets daily for 5 days as needed for cold sores. 10 tablet 11   venlafaxine  XR (EFFEXOR -XR) 75 MG 24 hr capsule TAKE 2  CAPSULES BY MOUTH DAILY WITH BREAKFAST *DOSE CHANGE* 60 capsule 11   XIIDRA  5 % SOLN Place 1 drop into both eyes daily as needed (dry eyes).     No current facility-administered medications for this visit.     Musculoskeletal: Strength & Muscle Tone: UTA Gait & Station: Seated Patient leans: N/A  Psychiatric Specialty Exam: Review of Systems   Psychiatric/Behavioral:  Positive for sleep disturbance.     There were no vitals taken for this visit.There is no height or weight on file to calculate BMI.  General Appearance: Casual  Eye Contact:  Fair  Speech:  Clear and Coherent  Volume:  Normal  Mood:  Euthymic  Affect:  Appropriate  Thought Process:  Goal Directed and Descriptions of Associations: Intact  Orientation:  Full (Time, Place, and Person)  Thought Content: Logical   Suicidal Thoughts:  No  Homicidal Thoughts:  No  Memory:  Immediate;   Fair Recent;   Fair Remote;   Fair  Judgement:  Fair  Insight:  Fair  Psychomotor Activity:  Normal  Concentration:  Concentration: Fair and Attention Span: Fair  Recall:  [employer] of Knowledge: Fair  Language: Fair  Akathisia:  No  Handed:  Right  AIMS (if indicated): not done  Assets:  Communication Skills Desire for Improvement Housing Social Support  ADL's:  Intact  Cognition: WNL  Sleep:  varies   Screenings: AIMS    Flowsheet Row Video Visit from 08/13/2022 in [facility] Office Visit from 05/13/2022 in [facility] Office Visit from 03/31/2022 in [facility] Office Visit from 01/22/2022 in [facility] Office Visit from 12/26/2021 in [facility]  AIMS Total Score 0 0 0 0 0   GAD-7    Flowsheet Row Office Visit from 01/31/2024 in [facility] Office Visit from 11/26/2023 in [facility] Office Visit from 10/19/2023 in [facility] Office Visit from 07/19/2023 in [facility] Office Visit from 06/21/2023 in [facility]  Total GAD-7 Score 0 0 0 0 1   PHQ2-9    Flowsheet Row Office Visit from 04/25/2024 in [facility] Visit from 01/31/2024 in [facility] Office Visit from 01/24/2024 in [facility] Visit from 11/26/2023 in [facility] Office Visit from 10/19/2023 in [facility]  PHQ-2 Total Score 0 0 0 0 0  PHQ-9 Total Score -- 0 -- 0 --   Flowsheet Row Video Visit from 04/25/2024 in [facility] Admission (Discharged) from 01/04/2024 in [facility] Video Visit from 12/14/2023 in [facility]  C-SSRS RISK CATEGORY Moderate Risk Moderate Risk Moderate Risk     Assessment and Plan: Sidra Oldfield Keizer is a 68 year old Caucasian female who has a history of depression, anxiety, insomnia was evaluated by telemedicine today.  Discussed assessment and plan as noted below.  Depression in remission Currently well-managed on current medication regimen. Continue Venlafaxine  150 mg daily-reduced dosage Continue Mirtazapine  as prescribed.  Generalized anxiety disorder-stable Currently anxiety symptoms well-controlled on the current medication regimen. Continue Mirtazapine  15 mg at bedtime  Insomnia-unstable Does get 6 to 7 hours of sleep however  wakes up feeling unrested.  Sleep disrupted due to external factors as noted above as well as chronic pain. Encouraged to work on sleep hygiene techniques Will need sufficient pain management. Continue Mirtazapine  15 mg at bedtime. Reevaluate in future sessions.  Alcohol use disorder in remission Currently sober since 1994.  Follow-up Follow-up in clinic in 4 months or sooner in person.  Consent: Patient/Guardian gives verbal consent for treatment and assignment of benefits for services provided during this visit. Patient/Guardian  expressed understanding and agreed to proceed.   This note was generated in part or whole with voice recognition software. Voice recognition is usually quite accurate but there are transcription errors that can and very often do occur. I apologize for any typographical errors that were not detected and corrected.    Gabrielle Quintela, MD 04/25/2024, 10:52 AM

## 2024-04-25 NOTE — Progress Notes (Signed)
 PROVIDER NOTE: Interpretation of information contained herein should be left to medically-trained personnel. Specific patient instructions are provided elsewhere under Patient Instructions section of medical record. This document was created in part using AI and STT-dictation technology, any transcriptional errors that may result from this process are unintentional.  Patient: Gabrielle Pineda  Service: E/M   PCP: [name], DO  DOB: June 16, 1956  DOS: 04/25/2024  Provider: Emmy MARLA Blanch, NP  MRN: [id number]  Delivery: Face-to-face  Specialty: Interventional Pain Management  Type: Established Patient  Setting: Ambulatory outpatient facility  Specialty designation: 09  Referring Prov.: [name], DO  Location: Outpatient office facility       History of present illness (HPI) Gabrielle Pineda, a 68 y.o. year old female, is here today because of her Chronic pain syndrome [G89.4]. Gabrielle Pineda primary complain today is Back Pain (Lower Back, radiating into right hip )  Pertinent problems: Gabrielle Pineda has Closed compression fracture of L5 vertebra (HCC); Cervical radiculopathy; Neuropathy; Spinal stenosis, lumbar region, with neurogenic claudication; Spondylosis of cervical region without myelopathy or radiculopathy; Cervical fusion syndrome; Chronic pain syndrome; Postlaminectomy syndrome, lumbar region; Chronic radicular lumbar pain; and Status post total hip replacement, right on their pertinent problem list.   Pain Assessment: Severity of Chronic pain is reported as a 7 /10. Location: Back Lower, Right/Right Hip. Onset:  . Quality:  . Timing:  . Modifying factor(s):  SABRA Vitals:  height is 5' 7 (1.702 m) and weight is 170 lb (77.1 kg). Her temporal temperature is 97.9 F (36.6 C). Her blood pressure is 191/92 (abnormal). Her oxygen  saturation is 97%.  BMI: Estimated body mass index is 26.63 kg/m as calculated from the following:   Height as of this encounter: 5' 7 (1.702 m).    Weight as of this encounter: 170 lb (77.1 kg).  Last encounter: 01/24/2024. Last procedure: Visit date not found.  Reason for encounter: medication management. No change in medical history since last visit.  Patient's pain is at baseline.  Patient continues multimodal pain regimen as prescribed.  States that it provides pain relief and improvement in functional status.   Pharmacotherapy Assessment   Analgesic: Oxycodone -acetaminophen  (Percocet) 5-325 mg tablet every 12 hours as needed for pain.  MME=15 [place] PMP: PDMP reviewed during this encounter.       Pharmacotherapy: No side-effects or adverse reactions reported. Compliance: No problems identified. Effectiveness: Clinically acceptable.  Gabrielle Pineda, Gabrielle Pineda  04/25/2024  9:16 AM  Sign when Signing Visit Nursing Pain Medication Assessment:  Safety precautions to be maintained throughout the outpatient stay will include: orient to surroundings, keep bed in low position, maintain call bell within reach at all times, provide assistance with transfer out of bed and ambulation.  Medication Inspection Compliance: Pill count conducted under aseptic conditions, in front of the patient. Neither the pills nor the bottle was removed from the patient's sight at any time. Once count was completed pills were immediately returned to the patient in their original bottle.  Medication: Oxycodone /APAP Pill/Patch Count: 37 of 60 pills/patches remain Pill/Patch Appearance: Markings consistent with prescribed medication Bottle Appearance: Standard pharmacy container. Clearly labeled. Filled Date: 08 / 23 / 2025 Last Medication intake:  Today    UDS:  Summary  Date Value Ref Range Status  10/26/2023 FINAL  Final    Comment:    ==================================================================== [employer] [facility] 13 (MW) ==================================================================== Test  Result       Flag       Units  Drug  Present and Declared for Prescription Verification   Oxycodone                       1313         EXPECTED   ng/mg creat   Oxymorphone                    470          EXPECTED   ng/mg creat   Noroxycodone                   2018         EXPECTED   ng/mg creat   Noroxymorphone                 218          EXPECTED   ng/mg creat    Sources of oxycodone  are scheduled prescription medications.    Oxymorphone, noroxycodone, and noroxymorphone are expected    metabolites of oxycodone . Oxymorphone is also available as a    scheduled prescription medication.  ==================================================================== Test                      Result    Flag   Units      Ref Range   Creatinine              93               mg/dL      >=79 ==================================================================== Declared Medications:  The flagging and interpretation on this report are based on the  following declared medications.  Unexpected results may arise from  inaccuracies in the declared medications.   **Note: The testing scope of this panel includes these medications:   Oxycodone    **Note: The testing scope of this panel does not include the  following reported medications:   Acetaminophen   Acyclovir  (Zovirax )  Albuterol  (Proventil  HFA)  Atogepant  Atorvastatin  (Lipitor)  Azelastine  (Astelin )  Budesonide (Breztri  Aerosphere)  Buspirone  (Buspar )  Carvedilol  (Coreg )  Eye Drops  Ezetimibe  (Zetia )  Fexofenadine (Allegra)  Fluticasone  (Flonase )  Formoterol (Breztri  Aerosphere)  Furosemide  (Lasix )  Gabapentin  (Neurontin )  Glycopyrrolate  (Breztri  Aerosphere)  Hydroxyzine  (Atarax )  Ipratropium (Atrovent )  Leflunomide  (Arava )  Losartan  (Cozaar )  Meloxicam  (Mobic )  Mirtazapine  (Remeron )  Montelukast  (Singulair )  Multivitamin  Omeprazole  (Prilosec)  Potassium (Klor-Con )  Topiramate  (Topamax )  Valacyclovir  (Valtrex )  Venlafaxine   (Effexor ) ==================================================================== For clinical consultation, please call [phone]. ====================================================================     No results found for: CBDTHCR No results found for: D8THCCBX No results found for: D9THCCBX  ROS  Constitutional: Denies any fever or chills Gastrointestinal: No reported hemesis, hematochezia, vomiting, or acute GI distress Musculoskeletal: Denies any acute onset joint swelling, redness, loss of ROM, or weakness Neurological: No reported episodes of acute onset apraxia, aphasia, dysarthria, agnosia, amnesia, paralysis, loss of coordination, or loss of consciousness  Medication Review  Lifitegrast , Nebulizer, acyclovir  ointment, albuterol , atorvastatin , azelastine , budesonide-glycopyrrolate -formoterol, carvedilol , ezetimibe , fexofenadine, fluticasone , furosemide , gabapentin , ipratropium, leflunomide , losartan , meloxicam , mirtazapine , montelukast , multivitamin with minerals, oxyCODONE -acetaminophen , pantoprazole , potassium chloride  SA, tiZANidine , topiramate , valACYclovir , and venlafaxine  XR  History Review  Allergy: Gabrielle Pineda is allergic to sulfasalazine and plaquenil [hydroxychloroquine]. Drug: Gabrielle Pineda  reports no history of drug use. Alcohol:  reports no history of alcohol use. Tobacco:  reports that she quit smoking about 3 years ago. Her  smoking use included cigarettes. She started smoking about 48 years ago. She has a 90 pack-year smoking history. She has never used smokeless tobacco. Social: Gabrielle Pineda  reports that she quit smoking about 3 years ago. Her smoking use included cigarettes. She started smoking about 48 years ago. She has a 90 pack-year smoking history. She has never used smokeless tobacco. She reports that she does not drink alcohol and does not use drugs. Medical:  has a past medical history of Alcohol abuse, Allergy (1983), Anemia, Anxiety,  Arthritis, Cataract, Cervicalgia, Cirrhosis (HCC) (1994), COPD (chronic obstructive pulmonary disease) (HCC), Depression, Difficult intubation, Dyspnea, GERD (gastroesophageal reflux disease), Headache, Heart murmur, Hypertension, Neuromuscular disorder (HCC), Other and unspecified hyperlipidemia, Oxygen  deficiency, Status post insertion of spinal cord stimulator, and Tachycardia. Surgical: Gabrielle Pineda  has a past surgical history that includes neck disc surgery; Breast enhancement surgery (1981); Esophagogastroduodenoscopy; Tonsillectomy and adenoidectomy (1973); Carpal tunnel release (11/11/2011); Carpal tunnel release (2013); Rotator cuff repair (06/16/2016); Rotator cuff repair; Colonoscopy with propofol  (N/A, 11/01/2017); Augmentation mammaplasty (Bilateral, 1982); Anterior cervical decomp/discectomy fusion (2012, 2015, 2018); Lumbar laminectomy/decompression microdiscectomy (N/A, 08/15/2019); Spinal cord stimulator insertion (N/A, 03/17/2021); Total hip arthroplasty (Right, 04/15/2021); Joint replacement (2022); Spine surgery (2020); Colonoscopy with propofol  (N/A, 08/09/2023); Esophagogastroduodenoscopy (egd) with propofol  (N/A, 08/09/2023); polypectomy (08/09/2023); and Spinal cord stimulator battery exchange (Right, 01/04/2024). Family: family history includes Alcohol abuse in her father, mother, sister, and sister; Anxiety disorder in her sister; Arthritis in her mother; Bipolar disorder in her mother; Cancer in her father; Depression in her sister; Drug abuse in her sister; Early death in her mother; Pancreatic cancer in her father; Suicidality in her mother.  Laboratory Chemistry Profile   Renal Lab Results  Component Value Date   BUN 19 12/22/2023   CREATININE 1.28 (H) 12/22/2023   BCR 13 11/26/2023   GFRAA 72 05/06/2020   GFRNONAA 46 (L) 12/22/2023    Hepatic Lab Results  Component Value Date   AST 30 07/19/2023   ALT 19 07/19/2023   ALBUMIN 4.2 11/26/2023   ALKPHOS 133 (H)  07/19/2023   LIPASE 63 03/23/2018    Electrolytes Lab Results  Component Value Date   NA 137 12/22/2023   K 4.0 12/22/2023   CL 108 12/22/2023   CALCIUM  8.9 12/22/2023   PHOS 3.9 11/26/2023    Bone Lab Results  Component Value Date   VD25OH 27.4 (L) 11/26/2023    Inflammation (CRP: Acute Phase) (ESR: Chronic Phase) No results found for: CRP, ESRSEDRATE, LATICACIDVEN       Note: Above Lab results reviewed.  Recent Imaging Review  MM 3D DIAGNOSTIC MAMMOGRAM UNILATERAL LEFT BREAST W/IMPLANT CLINICAL DATA:  Screening recall LEFT breast asymmetry  EXAM: DIGITAL DIAGNOSTIC UNILATERAL LEFT MAMMOGRAM WITH IMPLANTS, TOMOSYNTHESIS AND CAD  TECHNIQUE: Left digital diagnostic mammography and breast tomosynthesis was performed. Standard and/or implant displaced views were performed. The images were evaluated with computer-aided detection.  COMPARISON:  Previous exam(s).  ACR Breast Density Category b: There are scattered areas of fibroglandular density.  FINDINGS: The abnormality described at the time of screening mammogram resolves with additional imaging, compatible with superimposition of benign normal breast tissue. There is no mammographic evidence of malignancy. The patient has retropectoral implants which are not visualized on today's exam.  IMPRESSION: The abnormality described at the time of screening mammogram resolves with additional imaging, compatible with superimposition of benign normal breast tissue. There is no mammographic evidence of malignancy.  RECOMMENDATION: Patient may return to routine screening mammogram in 1 year.(Code:[id number]-B-01Y)  I have discussed the findings and recommendations with the patient. If applicable, a reminder letter will be sent to the patient regarding the next appointment.  BI-RADS CATEGORY  1: Negative.  Electronically Signed   By: Norleen Croak M.D.   On: 03/27/2024 16:01 MR LUMBAR SPINE WO CONTRAST CLINICAL DATA:   68 year old female with persistent low back pain. Pain radiating down the right leg with numbness and tingling. Prior surgery.  EXAM: MRI LUMBAR SPINE WITHOUT CONTRAST  TECHNIQUE: Multiplanar, multisequence MR imaging of the lumbar spine was performed. No intravenous contrast was administered.  COMPARISON:  Lumbar MRI 10/30/2022.  Lumbar radiographs 03/13/2024.  FINDINGS: Segmentation: Normal on the comparison radiographs, with vestigial S1-S2 disc space. The same numbering system was used on the MRI last year.  Alignment: Chronic anterolisthesis of L4 on L5, up to 9 mm, stable since last year. No significant scoliosis.  Vertebrae: Normal background bone marrow signal. Chronic L5 vertebral body loss of height is unchanged. Maintained vertebral height elsewhere. Chronic degenerative endplate spurring and signal changes in the lower lumbar spine and at the lumbosacral junction. Intact visible sacrum and SI joints. No marrow edema or evidence of acute osseous abnormality.  Conus medullaris and cauda equina: Conus extends to the L1-L2 level. No lower spinal cord or conus signal abnormality.  Paraspinal and other soft tissues: Postoperative posterior paraspinal soft tissue changes including a spinal stimulator device entering the posterior spinal canal at the T11-T12 level, leads from the right flank. No paraspinal fluid collection. Negative visible abdominal viscera.  Disc levels:  T11-T12: Posterior spinal stimulator device susceptibility artifact. Chronic disc space loss, mild disc bulging. No definite spinal stenosis.  T12-L1:  Negative.  L1-L2:  Negative.  L2-L3: Minimal disc desiccation and disc bulging. Moderate ligament flavum hypertrophy. Mild facet hypertrophy. No stenosis.  L3-L4: Disc desiccation with circumferential disc bulge. Chronic posterior annular fissure of the disc in the midline. Previous laminectomy with moderate residual facet hypertrophy. No  spinal or lateral recess stenosis. Mild L3 neural foraminal stenosis greater on the right.  L4-L5: Chronic spondylolisthesis. Disc space loss and bulky circumferential disc/pseudo disc with a broad-based posterior component. Severe facet hypertrophy, degenerative facet joint fluid on the right. Evidence of previous midline laminectomy here. Chronic synovial cyst located in the midline posteriorly and tracking to the right, more apparent on axial images today (series 10, images 22 and 23). Right lateral recess component of the cyst measuring about 6 mm on image 23 contributes to severe right lateral recess stenosis (descending right L5 nerve level) and there is severe overall spinal stenosis with moderate to severe bilateral L4 neural foraminal stenosis. This level has mildly progressed since last year.  L5-S1: Chronic severe disc space loss. Evidence of vacuum disc. Circumferential disc osteophyte complex. And bulky moderate to severe facet hypertrophy with evidence of previous midline laminectomy. Mild residual spinal stenosis with moderate to severe bilateral L5 foraminal and up to moderate lateral recess stenosis (S1 nerve levels, perhaps greater on the left).  IMPRESSION: 1. Previous midline posterior decompression of L3-L4 through L5-S1. Very severe degeneration at both L4-L5 and L5-S1, with chronic grade 1 to 2 spondylolisthesis at the former. 2. Severe L4-L5 spinal and right greater than left lateral recess stenosis in part related to a chronic synovial cyst asymmetric to the right. And moderate to severe biforaminal stenosis. Query right L4 and/or L5 radiculitis. This level appears mildly progressed from an MRI last year. 3. L5-S1 severe chronic disc, bulky endplate and facet degeneration with stable mild  spinal but moderate lateral recess stenosis and moderate to severe biforaminal stenosis. 4. Partially visible thoracic spinal stimulator device.  Electronically Signed    By: VEAR Hurst M.D.   On: 03/27/2024 08:31 Note: Reviewed        Physical Exam  Vitals: BP (!) 191/92 (BP Location: Right Arm, Patient Position: Sitting)   Temp 97.9 F (36.6 C) (Temporal)   Ht 5' 7 (1.702 m)   Wt 170 lb (77.1 kg)   SpO2 97%   BMI 26.63 kg/m  BMI: Estimated body mass index is 26.63 kg/m as calculated from the following:   Height as of this encounter: 5' 7 (1.702 m).   Weight as of this encounter: 170 lb (77.1 kg). Ideal: Ideal body weight: 61.6 kg (135 lb 12.9 oz) Adjusted ideal body weight: 67.8 kg (149 lb 7.7 oz) General appearance: Well nourished, well developed, and well hydrated. In no apparent acute distress Mental status: Alert, oriented x 3 (person, place, & time)       Respiratory: No evidence of acute respiratory distress Eyes: PERLA   Assessment   Diagnosis Status  1. Chronic pain syndrome   2. Spondylosis of cervical region without myelopathy or radiculopathy   3. Lumbar foraminal stenosis   4. Medication management   5. DDD (degenerative disc disease), cervical   6. Failed back surgical syndrome   7. Chronic radicular lumbar pain    Controlled Controlled Controlled   Updated Problems: No problems updated.  Plan of Care  Problem-specific:  Assessment and Plan  We will continue on current medication regimen.  Prescribing drug monitoring (PDMP) reviewed; findings consistent with the use of prescribed medication and no evidence of narcotic misuse or abuse.  Urine drug screening (UDS) up to date.  Schedule follow-up in 90 days for medication management.  No other Gabrielle issues or problems reported to this visit.    Gabrielle Pineda has a current medication list which includes the following long-term medication(s): albuterol , atorvastatin , azelastine , carvedilol , ezetimibe , fexofenadine, fluticasone , furosemide , gabapentin , ipratropium, losartan , mirtazapine , montelukast , potassium chloride  sa, topiramate , and venlafaxine   xr.  Pharmacotherapy (Medications Ordered): Meds ordered this encounter  Medications   oxyCODONE -acetaminophen  (PERCOCET) 5-325 MG tablet    Sig: Take 1 tablet by mouth every 12 (twelve) hours as needed for severe pain (pain score 7-10). Must last 30 days.    Dispense:  60 tablet    Refill:  0    Chronic Pain: STOP Act (Not applicable) Fill 1 day early if closed on refill date. Avoid benzodiazepines within 8 hours of opioids   oxyCODONE -acetaminophen  (PERCOCET) 5-325 MG tablet    Sig: Take 1 tablet by mouth every 12 (twelve) hours as needed for severe pain (pain score 7-10). Must last 30 days.    Dispense:  60 tablet    Refill:  0    Chronic Pain: STOP Act (Not applicable) Fill 1 day early if closed on refill date. Avoid benzodiazepines within 8 hours of opioids   oxyCODONE -acetaminophen  (PERCOCET) 5-325 MG tablet    Sig: Take 1 tablet by mouth every 12 (twelve) hours as needed for severe pain (pain score 7-10). Must last 30 days.    Dispense:  60 tablet    Refill:  0    Chronic Pain: STOP Act (Not applicable) Fill 1 day early if closed on refill date. Avoid benzodiazepines within 8 hours of opioids   Orders:  No orders of the defined types were placed in this encounter.  Return in about 3 months (around 07/25/2024) for (F2F), (MM), Emmy Blanch NP.    Recent Visits No visits were found meeting these conditions. Showing recent visits within past 90 days and meeting all other requirements Today's Visits Date Type Provider Dept  04/25/24 Office Visit Dennette Faulconer K, NP [facility]  Showing today's visits and meeting all other requirements Future Appointments Date Type Provider Dept  07/18/24 Appointment Harlene Petralia K, NP [facility]  Showing future appointments within next 90 days and meeting all other requirements  I discussed the assessment and treatment plan with the patient. The patient was provided an opportunity to ask questions and all were  answered. The patient agreed with the plan and demonstrated an understanding of the instructions.  Patient advised to call back or seek an in-person evaluation if the symptoms or condition worsens.  Duration of encounter: 30 minutes.  Total time on encounter, as per AMA guidelines included both the face-to-face and non-face-to-face time personally spent by the physician and/or other qualified health care professional(s) on the day of the encounter (includes time in activities that require the physician or other qualified health care professional and does not include time in activities normally performed by clinical staff). Physician's time may include the following activities when performed: Preparing to see the patient (e.g., pre-charting review of records, searching for previously ordered imaging, lab work, and nerve conduction tests) Review of prior analgesic pharmacotherapies. Reviewing PMP Interpreting ordered tests (e.g., lab work, imaging, nerve conduction tests) Performing post-procedure evaluations, including interpretation of diagnostic procedures Obtaining and/or reviewing separately obtained history Performing a medically appropriate examination and/or evaluation Counseling and educating the patient/family/caregiver Ordering medications, tests, or procedures Referring and communicating with other health care professionals (when not separately reported) Documenting clinical information in the electronic or other health record Independently interpreting results (not separately reported) and communicating results to the patient/ family/caregiver Care coordination (not separately reported)  Note by: Meleane Selinger K Dalana Pfahler, NP (TTS and AI technology used. I apologize for any typographical errors that were not detected and corrected.) Date: 04/25/2024; Time: 11:08 AM

## 2024-04-25 NOTE — Progress Notes (Signed)
 Nursing Pain Medication Assessment:  Safety precautions to be maintained throughout the outpatient stay will include: orient to surroundings, keep bed in low position, maintain call bell within reach at all times, provide assistance with transfer out of bed and ambulation.  Medication Inspection Compliance: Pill count conducted under aseptic conditions, in front of the patient. Neither the pills nor the bottle was removed from the patient's sight at any time. Once count was completed pills were immediately returned to the patient in their original bottle.  Medication: Oxycodone /APAP Pill/Patch Count: 37 of 60 pills/patches remain Pill/Patch Appearance: Markings consistent with prescribed medication Bottle Appearance: Standard pharmacy container. Clearly labeled. Filled Date: 08 / 23 / 2025 Last Medication intake:  Today

## 2024-05-01 ENCOUNTER — Other Ambulatory Visit: Payer: Self-pay | Admitting: Psychiatry

## 2024-05-01 ENCOUNTER — Other Ambulatory Visit: Payer: Self-pay | Admitting: Family Medicine

## 2024-05-01 DIAGNOSIS — R339 Retention of urine, unspecified: Secondary | ICD-10-CM

## 2024-05-02 ENCOUNTER — Encounter: Payer: Self-pay | Admitting: Pulmonary Disease

## 2024-05-02 ENCOUNTER — Ambulatory Visit: Admitting: Pulmonary Disease

## 2024-05-02 VITALS — BP 146/96 | HR 73 | Temp 97.6°F | Ht 67.0 in | Wt 170.2 lb

## 2024-05-02 DIAGNOSIS — G4736 Sleep related hypoventilation in conditions classified elsewhere: Secondary | ICD-10-CM | POA: Diagnosis not present

## 2024-05-02 DIAGNOSIS — J4489 Other specified chronic obstructive pulmonary disease: Secondary | ICD-10-CM | POA: Diagnosis not present

## 2024-05-02 DIAGNOSIS — R918 Other nonspecific abnormal finding of lung field: Secondary | ICD-10-CM

## 2024-05-02 DIAGNOSIS — Z01811 Encounter for preprocedural respiratory examination: Secondary | ICD-10-CM

## 2024-05-02 DIAGNOSIS — Z87891 Personal history of nicotine dependence: Secondary | ICD-10-CM

## 2024-05-02 DIAGNOSIS — Z23 Encounter for immunization: Secondary | ICD-10-CM

## 2024-05-02 DIAGNOSIS — J449 Chronic obstructive pulmonary disease, unspecified: Secondary | ICD-10-CM

## 2024-05-02 NOTE — Patient Instructions (Signed)
 VISIT SUMMARY:  Today, you came in for a follow-up appointment and to receive your flu vaccination. We discussed your chronic obstructive pulmonary disease (COPD), pulmonary nodules, and rheumatoid arthritis.  YOUR PLAN:  -STAGE 3 CHRONIC OBSTRUCTIVE PULMONARY DISEASE WITH NOCTURNAL HYPOXEMIA: Chronic obstructive pulmonary disease (COPD) is a long-term lung condition that makes it hard to breathe. You reported that your breathing is fine and that Breztri  is helping. Your lung sounds were clear during the examination. Please continue taking Breztri  and albuterol  as prescribed. You also received your flu shot today.  -PULMONARY NODULES IN FORMER SMOKER: Pulmonary nodules are small growths in the lungs. Your nodules have been stable, and you quit smoking three years ago. We will enroll you in a screening program for pulmonary nodules at a lower or no cost.  -RHEUMATOID ARTHRITIS: Rheumatoid arthritis is an autoimmune condition that causes joint inflammation. You reported that your condition is okay.  INSTRUCTIONS:  Please continue taking your medications as prescribed. You have been enrolled in a screening program for pulmonary nodules. Follow up with us  if you experience any changes in your symptoms or have any concerns.

## 2024-05-02 NOTE — Progress Notes (Unsigned)
 Subjective:    Patient ID: Gabrielle Pineda, female    DOB: [birth year], 68 y.o.   MRN: [id number]  Patient Care Team: [name], DO as PCP - General (Family Medicine) Perla Evalene PARAS, MD as PCP - Cardiology (Cardiology) Eappen, Saramma, MD as Consulting Physician (Psychiatry) Marcelino Nurse, MD as Consulting Physician (Pain Medicine) Alana Sharyle LABOR, RPH-CPP as Pharmacist  Chief Complaint  Patient presents with   COPD    Occasional shortness of breath on exertion.     BACKGROUND/INTERVAL:This is a 68 year old female, former smoker quit in 2022 (80 PY), initially seen for pulmonary consult December 11, 2021 for shortness of breath and COPD, abnormal CT chest with lung nodularity.  She has rheumatoid arthritis and is maintained on Arava .  She presents today for follow-up.  Last seen here on 24 September 2023 by me.This is a scheduled visit.   HPI Discussed the use of AI scribe software for clinical note transcription with the patient, who gave verbal consent to proceed.  History of Present Illness   Gabrielle Pineda is a 68 year old female with stage 3, severe COPD who presents for follow-up and flu vaccination.  She also requires preoperative reassessment prior to back surgery.  The patient reports that her breathing is fine and that Breztri  is helping. She has a history of smoking over a pack a day, quitting approximately three years ago. Previous scans for lung nodules have shown stability. The patient reports her breathing is fine.  Does not endorse any cough, sputum production, no shortness of breath over baseline.  She is on nocturnal oxygen  supplementation, she is compliant with the therapy and notes benefit of the therapy.  She has not had any fevers, chills or sweats.  No recent pulmonary exacerbations.  No recent admissions to the hospital.  She is on ARAVA  for rheumatoid arthritis.  She follows with Dr. Tobie in this regard.  She desires to have flu vaccine today.   Most recent PFTs showed modest IMPROVEMENT in lung function.  She is former cigarette smoker and qualifies for lung cancer screening.   THERAPIES 07/27/2022 through 12/30/2022: Pulmonary rehab.  Patient experienced significant improvement in shortness of breath.   DATA 07/29/2017 simple spirometry: Performed for disability determination FEV1 2.26 L or 79% predicted FVC 3.05 L or 86% predicted FEV1/FVC 74% 11/01/2020 [facility]): Normal left ventricle, LVEF over 55%, right ventricle normal in size with normal systolic function, mild tricuspid regurgitation, no pulmonary hypertension, no wall motion abnormalities 12/18/2021 CT high-res chest: No evidence of fibrotic interstitial lung disease, moderate emphysema and diffuse bronchial wall thickening, mild tracheobronchomalacia, irregular nodules of the left upper lobe and dependent right lower lobe measuring 8 mm, nonspecific.  Fine centrilobular nodularity concentrated in the lung bases consistent with smoking-related respiratory bronchiolitis, coronary artery disease 02/04/2022 PFTs: FEV1 1.45 L or 49% predicted, FVC 2.24 L or 59% predicted, FEV1/FVC 65%, lung volumes normal with modest air trapping,moderately to severe diffusion defect.  Flow volume loop consistent with airway obstruction, no evidence of fixed or variable upper airway obstruction.  There is significant decline in lung function when compared to the 2018 simple spirometry measurements 02/13/2022 overnight oximetry: Show desaturations of less than 88% for over 2 hours patient qualified for nocturnal oxygen  06/29/2022 CT chest: Previously noted left upper lobe and posterior right lower lobe pulmonary nodules resolved.  Tiny 3 mm anterior upper lobe pulmonary nodule suggest follow-up CT chest in 12 to 18 months.  Moderate centrilobular emphysema with mild  diffuse bronchial wall thickening compatible with COPD 06/02/2023 echocardiogram: LVEF 60 to 65%, mild LVH.  Grade 1 DD.  Normal  right ventricular function.  Mild mitral regurgitation, no other abnormalities 09/21/2023 PFTs: FEV1 1.57 L or 55% predicted, FVC 2.48 L or 66% predicted, FEV1/FVC 63%, lung volumes show mild air trapping otherwise normal.  Diffusion capacity moderately to severely reduced.  Compared to prior PFTs there has been a modest improvement in FEV1 and FVC.  Review of Systems A 10 point review of systems was performed and it is as noted above otherwise negative.   Patient Active Problem List   Diagnosis Date Noted   Spondylolisthesis at L4-L5 level 05/03/2024   Chronic kidney disease, stage 3b (HCC) 11/26/2023   Chronic rhinitis 11/26/2023   Battery end of life of spinal cord stimulator 11/08/2023   Viral upper respiratory tract infection 07/29/2023   Nicotine  dependence, cigarettes, in remission 07/29/2023   Mitral valve stenosis 07/29/2023   Nocturnal hypoxemia due to obstructive chronic bronchitis (HCC) 05/10/2023   Memory loss or impairment 04/22/2023   Localized edema 11/23/2022   MDD (major depressive disorder), recurrent, in full remission (HCC) 03/31/2022   Macrocytic anemia 02/20/2022   Overweight (BMI 25.0-29.9) 02/20/2022   COPD (chronic obstructive pulmonary disease) (HCC) 02/06/2022   Lung nodules 02/06/2022   Allergic rhinitis 02/06/2022   GAD (generalized anxiety disorder) 12/26/2021   Alcohol use disorder, moderate, in sustained remission (HCC) 12/26/2021   Need for vaccination against Streptococcus pneumoniae 11/17/2021   Annual physical exam 11/17/2021   Sleep disorder 10/23/2021   Iron deficiency anemia 06/26/2021   Depression, major, single episode, moderate (HCC) 06/26/2021   Status post total hip replacement, right 04/15/2021   Chronic radicular lumbar pain 10/03/2020   Postlaminectomy syndrome, lumbar region 06/03/2020   Moderate episode of recurrent major depressive disorder (HCC) 12/04/2019   Spondylosis of cervical region without myelopathy or radiculopathy  09/21/2019   DDD (degenerative disc disease), cervical 09/21/2019   Cervicalgia 09/21/2019   Cervical fusion syndrome 09/21/2019   Chronic pain syndrome 09/21/2019   Spinal stenosis, lumbar region, with neurogenic claudication 08/15/2019   History of adenomatous polyp of colon 11/04/2017   Cervical radiculopathy 08/02/2017   Neuropathy 08/02/2017   Essential hypertension 07/28/2017   Closed compression fracture of L5 vertebra (HCC) 07/07/2017   Sacral insufficiency fracture with routine healing 07/07/2017   Family history of malignant neoplasm of pancreas 04/03/2015   Hypercholesteremia 04/03/2015   Disorder of iron metabolism 04/03/2015   Carpal tunnel syndrome 03/29/2015   Acid reflux 10/11/2014   Closed fracture of distal phalanx of thumb 08/15/2014   Arthritis, degenerative 01/30/2014   Arthritis or polyarthritis, rheumatoid (HCC) 01/30/2014   Paroxysmal supraventricular tachycardia (HCC) 09/22/2010   B-complex deficiency 09/09/2007   Constipation 06/26/2007   Clinical depression 06/26/2007   Cold sore 06/26/2007   H/O alcohol abuse 06/26/2007   Cannot sleep 06/26/2007   Localized osteoarthrosis, hand 06/26/2007   Menopausal symptom 06/26/2007    Social History   Tobacco Use   Smoking status: Former    Current packs/day: 0.00    Average packs/day: 2.0 packs/day for 45.0 years (90.0 ttl pk-yrs)    Types: Cigarettes    Start date: 10/29/1975    Quit date: 10/28/2020    Years since quitting: 3.5   Smokeless tobacco: Never   Tobacco comments:    Quit smoking in 2022- khj 09/24/2023  Substance Use Topics   Alcohol use: No    Comment: recovering alcoholic Since 1994  Allergies  Allergen Reactions   Sulfasalazine Other (See Comments)    Does not remember reaction    Plaquenil [Hydroxychloroquine] Rash    Current Meds  Medication Sig   acyclovir  ointment (ZOVIRAX ) 5 % :Topical Every 3 Hours as needed for lesion   albuterol  (VENTOLIN  HFA) 108 (90 Base) MCG/ACT  inhaler INHALE TWO (2) PUFFS BY MOUTH AND INTO THE LUNGS EVERY 6 HOURS AS NEEDED FOR SHORTNESS OF BREATH   atorvastatin  (LIPITOR) 40 MG tablet Take 1 tablet (40 mg total) by mouth daily.   azelastine  (ASTELIN ) 0.1 % nasal spray Place 2 sprays into both nostrils 2 (two) times daily. Use in each nostril as directed   Budeson-Glycopyrrol-Formoterol (BREZTRI  AEROSPHERE) 160-9-4.8 MCG/ACT AERO Inhale 2 puffs into the lungs in the morning and at bedtime.   carvedilol  (COREG ) 12.5 MG tablet TAKE ONE TABLET BY MOUTH AT BREAKFAST AND AT BEDTIME   ezetimibe  (ZETIA ) 10 MG tablet Take 1 tablet (10 mg total) by mouth daily.   fexofenadine (ALLEGRA) 180 MG tablet Take 180 mg by mouth daily.   fluticasone  (FLONASE ) 50 MCG/ACT nasal spray Place 2 sprays into both nostrils in the morning and at bedtime.   furosemide  (LASIX ) 20 MG tablet TAKE 1 TABLET BY MOUTH ONCE DAILY   gabapentin  (NEURONTIN ) 400 MG capsule Take 1 capsule (400 mg total) by mouth at bedtime.   ipratropium (ATROVENT ) 0.03 % nasal spray Place 2 sprays into both nostrils 3 (three) times daily as needed for rhinitis.   leflunomide  (ARAVA ) 20 MG tablet Take 20 mg by mouth at bedtime.   losartan  (COZAAR ) 25 MG tablet Take 1 tablet (25 mg total) by mouth daily.   meloxicam  (MOBIC ) 15 MG tablet Take 15 mg by mouth at bedtime.   mirtazapine  (REMERON ) 15 MG tablet TAKE 1 TABLET BY MOUTH EVERY DAY AT BEDTIME   montelukast  (SINGULAIR ) 10 MG tablet TAKE 1 TABLET BY MOUTH EVERY DAY AT BEDTIME   Multiple Vitamin (MULTIVITAMIN WITH MINERALS) TABS tablet Take 1 tablet by mouth daily.   Nebulizer MISC Compressor nebulizer to use with nebulized medications as instructed   [START ON 05/08/2024] oxyCODONE -acetaminophen  (PERCOCET) 5-325 MG tablet Take 1 tablet by mouth every 12 (twelve) hours as needed for severe pain (pain score 7-10). Must last 30 days.   [START ON 06/07/2024] oxyCODONE -acetaminophen  (PERCOCET) 5-325 MG tablet Take 1 tablet by mouth every 12 (twelve)  hours as needed for severe pain (pain score 7-10). Must last 30 days.   [START ON 07/07/2024] oxyCODONE -acetaminophen  (PERCOCET) 5-325 MG tablet Take 1 tablet by mouth every 12 (twelve) hours as needed for severe pain (pain score 7-10). Must last 30 days.   pantoprazole  (PROTONIX ) 40 MG tablet Take 40 mg by mouth daily.   tiZANidine  (ZANAFLEX ) 4 MG tablet Take 0.5-1 tablets (2-4 mg total) by mouth every 8 (eight) hours as needed for muscle spasms.   topiramate  (TOPAMAX ) 25 MG tablet Take 1 tablet (25 mg total) by mouth 2 (two) times daily.   valACYclovir  (VALTREX ) 1000 MG tablet Take 2 tablets daily for 5 days as needed for cold sores.   venlafaxine  XR (EFFEXOR -XR) 75 MG 24 hr capsule TAKE 2 CAPSULES BY MOUTH DAILY WITH BREAKFAST *DOSE CHANGE*   XIIDRA  5 % SOLN Place 1 drop into both eyes daily as needed (dry eyes).   [DISCONTINUED] potassium chloride  SA (KLOR-CON  M) 20 MEQ tablet Take 1 tablet (20 mEq total) by mouth every morning.    Immunization History  Administered Date(s) Administered   Fluad Quad(high Dose  65+) 10/01/2021, 05/14/2022   Fluad Trivalent(High Dose 65+) 08/25/2023   INFLUENZA, HIGH DOSE SEASONAL PF 05/02/2024   Influenza Split 09/07/2012   Influenza,inj,Quad PF,6+ Mos 11/18/2015, 07/23/2016, 07/02/2017, 04/14/2018, 04/17/2019, 04/19/2020   Influenza-Unspecified 09/24/2014   PFIZER(Purple Top)SARS-COV-2 Vaccination 11/21/2019, 12/12/2019   PNEUMOCOCCAL CONJUGATE-20 11/17/2021   Pneumococcal Polysaccharide-23 02/12/2011, 09/24/2014, 04/19/2020   Tdap 02/12/2011, 11/15/2015        Objective:     BP (!) 146/96   Pulse 73   Temp 97.6 F (36.4 C) (Temporal)   Ht 5' 7 (1.702 m)   Wt 170 lb 3.2 oz (77.2 kg)   SpO2 95%   BMI 26.66 kg/m   SpO2: 95 %  GENERAL: Well-developed, overweight woman, no acute distress.  No use of accessory use of respiration.  Fully ambulatory.  No conversational dyspnea. HEAD: Normocephalic, atraumatic.  EYES: Pupils equal, round,  reactive to light.  No scleral icterus.  MOUTH: Poor dentition, oral mucosa moist.  No thrush. NECK: Supple. No thyromegaly. Trachea midline. No JVD.  No adenopathy. PULMONARY: Good air entry bilaterally.  Coarse, otherwise no adventitious sounds. CARDIOVASCULAR: S1 and S2. Regular rate and rhythm.  Grade 2/6 systolic ejection murmur left sternal border.  This appears to be new. ABDOMEN: Benign. MUSCULOSKELETAL: Stigmata of rheumatoid arthritis on both hands, no clubbing, no edema.  NEUROLOGIC: No overt focal deficit.  No gait disturbance.  Speech is fluent. SKIN: Intact,warm,dry. PSYCH: Mood and behavior normal.    Assessment & Plan:     ICD-10-CM   1. Stage 3 severe COPD by GOLD classification (HCC)  J44.9     2. Nocturnal hypoxemia due to obstructive chronic bronchitis (HCC)  J44.89    G47.36     3. Former cigarette smoker  Z87.891 Ambulatory Referral for Lung Cancer Scre    4. Need for influenza vaccination  Z23       Orders Placed This Encounter  Procedures   Flu vaccine HIGH DOSE PF(Fluzone Trivalent)   Ambulatory Referral for Lung Cancer Scre    Referral Priority:   Routine    Referral Type:   Consultation    Referral Reason:   Specialty Services Required    Number of Visits Requested:   1   Discussion:    Stage 3 chronic obstructive pulmonary disease with nocturnal hypoxemia Breathing is reported as fine and Breztri  is helping. Lung sounds are clear on examination. - Administer flu shot for health maintenance - Continue Breztri  and albuterol  as prescribed  Pulmonary nodules in former smoker Pulmonary nodules are stable in a former smoker who quit three years ago. Previously monitored with scans showing stability. - Enroll in screening program for pulmonary nodules at lower to no cost  Rheumatoid arthritis Rheumatoid arthritis is well managed on current therapy.    Preoperative respiratory exam ARISCAT score is 38 points, this places her at intermediate risk  (13.3%) of in-hospital post op pulmonary complications.  Potential pulmonary complications include respiratory failure, respiratory infection, pleural effusion, atelectasis, pneumothorax, bronchospasm and aspiration pneumonitis.  This risk cannot be further reduced.  Patient is optimally compensated with regards to her lung function.  Former cigarette smoker Referral to lung cancer screening program.   Advised if symptoms do not improve or worsen, to please contact office for sooner follow up or seek emergency care.    I spent 40 minutes of dedicated to the care of this patient on the date of this encounter to include pre-visit review of records, face-to-face time with the patient discussing conditions  above, post visit ordering of testing, clinical documentation with the electronic health record, making appropriate referrals as documented, and communicating necessary findings to members of the patients care team.     C. Leita Sanders, MD Advanced Bronchoscopy [facility]    *This note was generated using voice recognition software/Dragon and/or AI transcription program.  Despite best efforts to proofread, errors can occur which can change the meaning. Any transcriptional errors that result from this process are unintentional and may not be fully corrected at the time of dictation.

## 2024-05-03 ENCOUNTER — Encounter: Payer: Self-pay | Admitting: Neurosurgery

## 2024-05-03 ENCOUNTER — Ambulatory Visit: Payer: Self-pay | Admitting: Neurosurgery

## 2024-05-03 ENCOUNTER — Encounter: Payer: Self-pay | Admitting: Pulmonary Disease

## 2024-05-03 ENCOUNTER — Other Ambulatory Visit: Payer: Self-pay

## 2024-05-03 ENCOUNTER — Telehealth: Payer: Self-pay

## 2024-05-03 ENCOUNTER — Ambulatory Visit: Admitting: Neurosurgery

## 2024-05-03 VITALS — BP 118/84 | Ht 67.0 in | Wt 171.2 lb

## 2024-05-03 DIAGNOSIS — M48062 Spinal stenosis, lumbar region with neurogenic claudication: Secondary | ICD-10-CM

## 2024-05-03 DIAGNOSIS — M8580 Other specified disorders of bone density and structure, unspecified site: Secondary | ICD-10-CM

## 2024-05-03 DIAGNOSIS — M21371 Foot drop, right foot: Secondary | ICD-10-CM | POA: Diagnosis not present

## 2024-05-03 DIAGNOSIS — M4316 Spondylolisthesis, lumbar region: Secondary | ICD-10-CM

## 2024-05-03 DIAGNOSIS — Z01818 Encounter for other preprocedural examination: Secondary | ICD-10-CM

## 2024-05-03 MED ORDER — PREDNISONE 10 MG (21) PO TBPK: ORAL_TABLET | ORAL | 0 refills | Status: DC

## 2024-05-03 NOTE — Telephone Encounter (Signed)
 She did not mention this during her visit.  If she monitoring her oxygen  at home?  We can send in a prednisone  taper to see if this helps with the allergic sinusitis symptoms.

## 2024-05-03 NOTE — Progress Notes (Signed)
 REFERRING PHYSICIAN:  Donzella Lauraine LOISE Rosalea [address] [place],  [name]  DOS: 01/04/24  replacement of SCS IPG  HISTORY OF PRESENT ILLNESS: Gabrielle Pineda is well-known to our clinic.  Gabrielle Pineda had a history of a spinal cord stimulator.  Gabrielle Pineda also has significant lumbar spondylosis for which we have been following her.  Continuing to have significant stenosis and low back pain.  Claudicatory symptoms as well.  Gabrielle Pineda feels like her low back pain is becoming severe in nature and worsening over the past 6 months.  Gabrielle Pineda has been working with physical therapy and has been discharged from physical therapy.  Gabrielle Pineda continues to have significant lower extremity pain numbness tingling and radiation worse on the right than it is on the left.  Gabrielle Pineda has a history of a chronic right-sided foot drop.  Gabrielle Pineda does not smoke.  Gabrielle Pineda has a history of osteopenia but not osteoporosis.  Has had a recent DEXA scan.  PHYSICAL EXAMINATION:  General: Patient is well developed, well nourished, calm, collected, and in no apparent distress.   NEUROLOGICAL:  General: In no acute distress.   Awake, alert, oriented to person, place, and time.  Pupils equal round and reactive to light.  Facial tone is symmetric.    - SLR  2+ bilatel patella, 1+ achilles rihgt, 2 + left   Strength:            Side Iliopsoas Quads Hamstring PF DF EHL  R 5 5 5 5 2 2   L 5 5 5 5 5 5    Incision c/d/i   ROS (Neurologic):  Negative except as noted above  IMAGING: EXAM: MRI CERVICAL SPINE WITHOUT CONTRAST   TECHNIQUE: Multiplanar, multisequence MR imaging of the cervical spine was performed. No intravenous contrast was administered.   COMPARISON:  Cervical myelogram CT 10/22/2016. Radiographs 05/24/2023.   FINDINGS: Technical note: Despite efforts by the technologist and patient, mild motion artifact is present on today's exam and could not be eliminated. This reduces exam sensitivity and specificity.   Alignment:  Chronic straightening of the usual cervical lordosis. Minimal anterolisthesis at T2-3.   Vertebrae: Patient is status post extensive anterior cervical discectomy and fusion extending from C3 through T1 as correlated with the recent radiographs. No evidence of acute fracture, discitis or aggressive osseous lesion. The interbody fusion appears solid from C3 through C6 and at T7-T1. Solid interbody fusion at C6-7 not definitely demonstrated.   Cord: New mild cord flattening at C2-3 without abnormal cord signal. The cervical cord otherwise appears unremarkable.   Posterior Fossa, vertebral arteries, paraspinal tissues: Visualized portions of the posterior fossa appear unremarkable.Bilateral vertebral artery flow voids. No significant paraspinal findings.   Disc levels:   C1-2: Synovial thickening around the odontoid process without resulting mass effect on the cervical cord. Lateral mass atlantoaxial degenerative changes bilaterally.   C2-3: Relatively preserved disc height with chronic disc bulging and a left-sided disc osteophyte complex. Increased posterior element hypertrophy with resulting effacement of the CSF surrounding the cord, mild cord flattening and narrowing of the AP diameter of the canal to 5 mm. Similar mild right and moderate left foraminal narrowing.   C3-4: Stable postsurgical changes with solid interbody fusion. No spinal stenosis or significant foraminal narrowing.   C4-5: Stable postsurgical changes with solid interbody fusion. No spinal stenosis or significant foraminal narrowing.   C5-6: Stable postsurgical changes with solid interbody fusion. No spinal stenosis or significant foraminal narrowing.   C6-7: Stable postsurgical changes  with residual low T2 signal within the disc space. No spinal stenosis. Mild foraminal narrowing bilaterally appears chronic.   C7-T1: Stable postsurgical changes with solid interbody fusion. No spinal stenosis. Mild  foraminal narrowing bilaterally.   T1-2: Mild disc bulging and bilateral facet hypertrophy. Mild foraminal narrowing bilaterally without definite nerve root impingement. The spinal canal is patent.   T2-3: Disc bulging and bilateral facet hypertrophy contribute to mild foraminal narrowing bilaterally. No cord deformity.   IMPRESSION: 1. Stable postsurgical changes from C3 through T1 with solid interbody fusion from C3 through C6 and at T7-T1. Solid fusion not definitively demonstrated at C6-7. 2. No residual spinal stenosis at the operative levels. Mild foraminal narrowing bilaterally at C6-7 and C7-T1. 3. Increased posterior element hypertrophy at C2-3 with resulting moderate spinal stenosis and mild cord flattening. No abnormal cord signal. Similar mild right and moderate left foraminal narrowing at this level. 4. Mild spondylosis at T1-2 and T2-3 without resulting spinal stenosis or cord deformity.  Narrative & Impression  EXAM: DUAL X-RAY ABSORPTIOMETRY (DXA) FOR BONE MINERAL DENSITY 03/21/2024 11:24 am   CLINICAL DATA:  68 year old Female Postmenopausal.   TECHNIQUE: An axial (e.g., hips, spine) and/or appendicular (e.g., radius) exam was performed, as appropriate, using GE Secretary/administrator at [facility] Images are obtained for bone mineral density measurement and are not obtained for diagnostic purposes. [id number]   Exclusions: Lumbar spine due to degenerative changes, right hip due to replacement   COMPARISON:  12/23/2021   FINDINGS: Scan quality: Good.   LEFT FEMORAL NECK:   BMD (in g/cm2): 0.866   T-score: -1.2   Z-score: 0.3   LEFT TOTAL HIP:   BMD (in g/cm2): 0.833   T-score: -1.4   Z-score: -0.1   Rate of change from previous exam: No significant rate of change from previous exam.   RIGHT FOREARM (RADIUS 33%):   BMD (in g/cm2): 0.723   T-score: -1.7   Z-score: -0.2   Rate of change from previous exam: -5.6  %   FRAX 10-YEAR PROBABILITY OF FRACTURE:   10-year fracture risk is performed using the [facility] FRAX calculator based on patient-reported risk factors.   Major osteoporotic fracture: 18.6% Hip fracture: 2.1%   Other situations known to alter the reliability of the FRAX score should be considered when making treatment decisions, including chronic glucocorticoid use and past treatments. Further guidance on treatment can be found at the [facility]'s website [email or web address]/.   IMPRESSION: Osteopenia based on BMD.   Fracture risk is increased.   RECOMMENDATIONS: 1. All patients should optimize calcium  and vitamin D  intake.   2. Consider FDA-approved medical therapies in postmenopausal women and men aged 40 years and older, based on the following:   - A hip or vertebral (clinical or morphometric) fracture   - T-score less than or equal to -2.5 and secondary causes have been excluded.   - Low bone mass (T-score between -1.0 and -2.5) and a 10-year probability of a hip fracture greater than or equal to 3% or a 10-year probability of a major osteoporosis-related fracture greater than or equal to 20% based on the [place] -adapted WHO algorithm.   - Clinician judgment and/or patient preferences may indicate treatment for people with 10-year fracture probabilities above or below these levels   3. Patients with diagnosis of osteoporosis or at high risk for fracture should have regular bone mineral density tests. For patients eligible for Medicare, routine testing is allowed once every 2  years. The testing frequency can be increased to one year for patients who have rapidly progressing disease, those who are receiving or discontinuing medical therapy to restore bone mass, or have additional risk factors.     Electronically Signed   By: Reyes Phi M.D.   On: 03/22/2024 15:23     IMPRESSION: 1. Previous midline posterior decompression of L3-L4  through L5-S1. Very severe degeneration at both L4-L5 and L5-S1, with chronic grade 1 to 2 spondylolisthesis at the former. 2. Severe L4-L5 spinal and right greater than left lateral recess stenosis in part related to a chronic synovial cyst asymmetric to the right. And moderate to severe biforaminal stenosis. Query right L4 and/or L5 radiculitis. This level appears mildly progressed from an MRI last year. 3. L5-S1 severe chronic disc, bulky endplate and facet degeneration with stable mild spinal but moderate lateral recess stenosis and moderate to severe biforaminal stenosis. 4. Partially visible thoracic spinal stimulator device.     Electronically Signed   By: VEAR Hurst M.D.   On: 03/27/2024 08:31  ASSESSMENT/PLAN:  CULLEN VANALLEN is doing well SCS battery exchange with spinal cord stimulator optimization.  Gabrielle Pineda continues to have back pain and claudicatorysymptoms and right lower extremity pain.  Has a grade 1-2 spondylolisthesis with significant stenosis.  Has had a DEXA scan with osteopenia but not osteoporosis.  Gabrielle Pineda has x-rays and a new recent MRI with worsening stenosis.  Gabrielle Pineda has right-sided foot drop which is chronic at this point but has significant pain in her right lower extremity and back.  Gabrielle Pineda has had significant amount of physical therapy but is not having any considerable improvement.  Gabrielle Pineda does not use any nicotine .  Her pain continues to be interfering with her quality of life and keeping her from doing her activities of daily living.  With her progressive symptomatology, severe debilitating pain, right lower extremity weakness, failure to improve with conservative management including a discharge from physical therapy as well as her L4-5 spondylolisthesis with worsening alignment and history of a chronic right sided synovial cyst we will plan for a right sided approach to a transforaminal lumbar interbody fusion with posterior instrumentation.  We discussed risks and benefits of  surgery including but not limited to CSF leak, nerve injury, failure to improve symptoms, need for further surgery.  We discussed a lateral approach, however Gabrielle Pineda does have anterior riding psoas musculature and even clearly see her neurovascular bundle go ventral to the midpoint of the disc on the lateral side which puts her at high risk for nerve injury with a lateral approach.  Gabrielle MICAEL Sharps, MD Department of neurosurgery

## 2024-05-03 NOTE — Telephone Encounter (Signed)
 Copied from CRM [id number]. Topic: Clinical - Medication Question >> May 03, 2024  2:32 PM Gustabo BIRCH wrote: Rosina from [employer] has questions about the medication-valACYclovir  (VALTREX ) 1000 MG tablet Call back [phone]

## 2024-05-03 NOTE — Telephone Encounter (Signed)
 Called [facility] to the number provided spoke with [facility]ist. Per [facility] one of the concerns is the current dosing.  Reports that they have on file from previous to take 1 tablet by mouth daily and now one with instruction to Take 2 tables daily for 5 days as needed.  They want to know which one is the correct one and which one do they need to discard.

## 2024-05-03 NOTE — H&P (View-Only) (Signed)
 REFERRING PHYSICIAN:  Donzella Lauraine LOISE [place] [address] [place],  [name]  DOS: 01/04/24  replacement of SCS IPG  HISTORY OF PRESENT ILLNESS: Gabrielle Pineda is well-known to our clinic.  She had a history of a spinal cord stimulator.  She also has significant lumbar spondylosis for which we have been following her.  Continuing to have significant stenosis and low back pain.  Claudicatory symptoms as well.  She feels like her low back pain is becoming severe in nature and worsening over the past 6 months.  She has been working with physical therapy and has been discharged from physical therapy.  She continues to have significant lower extremity pain numbness tingling and radiation worse on the right than it is on the left.  She has a history of a chronic right-sided foot drop.  She does not smoke.  She has a history of osteopenia but not osteoporosis.  Has had a recent DEXA scan.  PHYSICAL EXAMINATION:  General: Patient is well developed, well nourished, calm, collected, and in no apparent distress.   NEUROLOGICAL:  General: In no acute distress.   Awake, alert, oriented to person, place, and time.  Pupils equal round and reactive to light.  Facial tone is symmetric.    - SLR  2+ bilatel patella, 1+ achilles rihgt, 2 + left   Strength:            Side Iliopsoas Quads Hamstring PF DF EHL  R 5 5 5 5 2 2   L 5 5 5 5 5 5    Incision c/d/i   ROS (Neurologic):  Negative except as noted above  IMAGING: EXAM: MRI CERVICAL SPINE WITHOUT CONTRAST   TECHNIQUE: Multiplanar, multisequence MR imaging of the cervical spine was performed. No intravenous contrast was administered.   COMPARISON:  Cervical myelogram CT 10/22/2016. Radiographs 05/24/2023.   FINDINGS: Technical note: Despite efforts by the technologist and patient, mild motion artifact is present on today's exam and could not be eliminated. This reduces exam sensitivity and specificity.   Alignment:  Chronic straightening of the usual cervical lordosis. Minimal anterolisthesis at T2-3.   Vertebrae: Patient is status post extensive anterior cervical discectomy and fusion extending from C3 through T1 as correlated with the recent radiographs. No evidence of acute fracture, discitis or aggressive osseous lesion. The interbody fusion appears solid from C3 through C6 and at T7-T1. Solid interbody fusion at C6-7 not definitely demonstrated.   Cord: New mild cord flattening at C2-3 without abnormal cord signal. The cervical cord otherwise appears unremarkable.   Posterior Fossa, vertebral arteries, paraspinal tissues: Visualized portions of the posterior fossa appear unremarkable.Bilateral vertebral artery flow voids. No significant paraspinal findings.   Disc levels:   C1-2: Synovial thickening around the odontoid process without resulting mass effect on the cervical cord. Lateral mass atlantoaxial degenerative changes bilaterally.   C2-3: Relatively preserved disc height with chronic disc bulging and a left-sided disc osteophyte complex. Increased posterior element hypertrophy with resulting effacement of the CSF surrounding the cord, mild cord flattening and narrowing of the AP diameter of the canal to 5 mm. Similar mild right and moderate left foraminal narrowing.   C3-4: Stable postsurgical changes with solid interbody fusion. No spinal stenosis or significant foraminal narrowing.   C4-5: Stable postsurgical changes with solid interbody fusion. No spinal stenosis or significant foraminal narrowing.   C5-6: Stable postsurgical changes with solid interbody fusion. No spinal stenosis or significant foraminal narrowing.   C6-7: Stable postsurgical changes  with residual low T2 signal within the disc space. No spinal stenosis. Mild foraminal narrowing bilaterally appears chronic.   C7-T1: Stable postsurgical changes with solid interbody fusion. No spinal stenosis. Mild  foraminal narrowing bilaterally.   T1-2: Mild disc bulging and bilateral facet hypertrophy. Mild foraminal narrowing bilaterally without definite nerve root impingement. The spinal canal is patent.   T2-3: Disc bulging and bilateral facet hypertrophy contribute to mild foraminal narrowing bilaterally. No cord deformity.   IMPRESSION: 1. Stable postsurgical changes from C3 through T1 with solid interbody fusion from C3 through C6 and at T7-T1. Solid fusion not definitively demonstrated at C6-7. 2. No residual spinal stenosis at the operative levels. Mild foraminal narrowing bilaterally at C6-7 and C7-T1. 3. Increased posterior element hypertrophy at C2-3 with resulting moderate spinal stenosis and mild cord flattening. No abnormal cord signal. Similar mild right and moderate left foraminal narrowing at this level. 4. Mild spondylosis at T1-2 and T2-3 without resulting spinal stenosis or cord deformity.  Narrative & Impression  EXAM: DUAL X-RAY ABSORPTIOMETRY (DXA) FOR BONE MINERAL DENSITY 03/21/2024 11:24 am   CLINICAL DATA:  68 year old Female Postmenopausal.   TECHNIQUE: An axial (e.g., hips, spine) and/or appendicular (e.g., radius) exam was performed, as appropriate, using GE Secretary/administrator at [facility] Images are obtained for bone mineral density measurement and are not obtained for diagnostic purposes. [id number]   Exclusions: Lumbar spine due to degenerative changes, right hip due to replacement   COMPARISON:  12/23/2021   FINDINGS: Scan quality: Good.   LEFT FEMORAL NECK:   BMD (in g/cm2): 0.866   T-score: -1.2   Z-score: 0.3   LEFT TOTAL HIP:   BMD (in g/cm2): 0.833   T-score: -1.4   Z-score: -0.1   Rate of change from previous exam: No significant rate of change from previous exam.   RIGHT FOREARM (RADIUS 33%):   BMD (in g/cm2): 0.723   T-score: -1.7   Z-score: -0.2   Rate of change from previous exam: -5.6  %   FRAX 10-YEAR PROBABILITY OF FRACTURE:   10-year fracture risk is performed using the [facility] FRAX calculator based on patient-reported risk factors.   Major osteoporotic fracture: 18.6% Hip fracture: 2.1%   Other situations known to alter the reliability of the FRAX score should be considered when making treatment decisions, including chronic glucocorticoid use and past treatments. Further guidance on treatment can be found at the [facility]'s website [email or web address]/.   IMPRESSION: Osteopenia based on BMD.   Fracture risk is increased.   RECOMMENDATIONS: 1. All patients should optimize calcium  and vitamin D  intake.   2. Consider FDA-approved medical therapies in postmenopausal women and men aged 40 years and older, based on the following:   - A hip or vertebral (clinical or morphometric) fracture   - T-score less than or equal to -2.5 and secondary causes have been excluded.   - Low bone mass (T-score between -1.0 and -2.5) and a 10-year probability of a hip fracture greater than or equal to 3% or a 10-year probability of a major osteoporosis-related fracture greater than or equal to 20% based on the [place] -adapted WHO algorithm.   - Clinician judgment and/or patient preferences may indicate treatment for people with 10-year fracture probabilities above or below these levels   3. Patients with diagnosis of osteoporosis or at high risk for fracture should have regular bone mineral density tests. For patients eligible for Medicare, routine testing is allowed once every 2  years. The testing frequency can be increased to one year for patients who have rapidly progressing disease, those who are receiving or discontinuing medical therapy to restore bone mass, or have additional risk factors.     Electronically Signed   By: Reyes Phi M.D.   On: 03/22/2024 15:23     IMPRESSION: 1. Previous midline posterior decompression of L3-L4  through L5-S1. Very severe degeneration at both L4-L5 and L5-S1, with chronic grade 1 to 2 spondylolisthesis at the former. 2. Severe L4-L5 spinal and right greater than left lateral recess stenosis in part related to a chronic synovial cyst asymmetric to the right. And moderate to severe biforaminal stenosis. Query right L4 and/or L5 radiculitis. This level appears mildly progressed from an MRI last year. 3. L5-S1 severe chronic disc, bulky endplate and facet degeneration with stable mild spinal but moderate lateral recess stenosis and moderate to severe biforaminal stenosis. 4. Partially visible thoracic spinal stimulator device.     Electronically Signed   By: VEAR Hurst M.D.   On: 03/27/2024 08:31  ASSESSMENT/PLAN:  CULLEN VANALLEN is doing well SCS battery exchange with spinal cord stimulator optimization.  She continues to have back pain and claudicatorysymptoms and right lower extremity pain.  Has a grade 1-2 spondylolisthesis with significant stenosis.  Has had a DEXA scan with osteopenia but not osteoporosis.  She has x-rays and a new recent MRI with worsening stenosis.  She has right-sided foot drop which is chronic at this point but has significant pain in her right lower extremity and back.  She has had significant amount of physical therapy but is not having any considerable improvement.  She does not use any nicotine .  Her pain continues to be interfering with her quality of life and keeping her from doing her activities of daily living.  With her progressive symptomatology, severe debilitating pain, right lower extremity weakness, failure to improve with conservative management including a discharge from physical therapy as well as her L4-5 spondylolisthesis with worsening alignment and history of a chronic right sided synovial cyst we will plan for a right sided approach to a transforaminal lumbar interbody fusion with posterior instrumentation.  We discussed risks and benefits of  surgery including but not limited to CSF leak, nerve injury, failure to improve symptoms, need for further surgery.  We discussed a lateral approach, however she does have anterior riding psoas musculature and even clearly see her neurovascular bundle go ventral to the midpoint of the disc on the lateral side which puts her at high risk for nerve injury with a lateral approach.  [name] Sharps, MD Department of neurosurgery

## 2024-05-03 NOTE — Patient Instructions (Addendum)
 Please see below for information in regards to your upcoming surgery:   Planned surgery: L4-5 Maximum Access Surgery Transforaminal Lumbar Interbody Fusion (MAS TLIF) with pedicle screw fixation   Surgery date: 05/23/24 at [facility] (Medical Mall: [address], [place], [name]) - you will find out your arrival time the business day before your surgery.   Pre-op appointment at [facility] Pre-admit Testing: you will receive a call with a date/time for this appointment. If you are scheduled for an in person appointment, Pre-admit Testing is located on the first floor of the Medical Arts building, [address] [facility] [address], [address]. During this appointment, they will advise you which medications you can take the morning of surgery, and which medications you will need to hold for surgery. Labs (such as blood work, EKG) may be done at your pre-op appointment. You are not required to fast for these labs. Should you need to change your pre-op appointment, please call Pre-admit testing at [phone].     Surgical clearance: we will send a clearance form to Dedra Sanders (Pulmonology). They may wish to see you in their office prior to signing the clearance form. If so, they may call you to schedule an appointment.     NSAIDS (Non-steroidal anti-inflammatory drugs): because you are having a fusion, please avoid taking any NSAIDS (examples: ibuprofen , motrin , aleve, naproxen, meloxicam , diclofenac ) for 3 months after surgery. Celebrex  is an exception and is OK to take, if prescribed. Tylenol  is not an NSAID.    Common restrictions after spine surgery: No bending, lifting, or twisting ("BLT"). Avoid lifting objects heavier than 10 pounds for the first 6 weeks after surgery. Where possible, avoid household activities that involve lifting, bending, reaching, pushing, or pulling such as laundry, vacuuming, grocery shopping, and childcare. Try to arrange for help  from friends and family for these activities while you heal. Do not drive while taking prescription pain medication. Weeks 6 through 12 after surgery: avoid lifting more than 25 pounds.    X-rays after surgery: Because you are having a fusion or arthroplasty: for appointments after your 2 week follow-up: please arrive at the [facility] outpatient imaging center ([address], [address], [place]) or [employer] one hour prior to your appointment for x-rays. This applies to every appointment after your 2 week follow-up. Failure to do so may result in your appointment being rescheduled. *We recently started construction to have x-ray in our office. This may be completed by the time you come in for your 6 week post-op appointment. Please check with us  closer to that time to see if you can have your x-rays at our office*    How to contact us :  If you have any questions/concerns before or after surgery, you can reach us  at [phone], or you can send a mychart message. We can be reached by phone or mychart 8am-4pm, Monday-Friday.  *Please note: Calls after 4pm are forwarded to a third party answering service. Mychart messages are not routinely monitored during evenings, weekends, and holidays. Please call our office to contact the answering service for urgent concerns during non-business hours.   If you have FMLA/disability paperwork, please drop it off or fax it to [phone]   Appointments/FMLA & disability paperwork: [employer] Registered Nurse/Surgery scheduler: Rosey Eide, RN Certified Medical Assistants: Don, CMA, Elenor, CMA, & Damien, CMA Physician Assistants: Lyle Decamp, PA-C, Edsel Goods, PA-C & Glade Boys, PA-C Surgeons: Penne Sharps, MD & Reeves Daisy, MD  [facility] PREADMIT TESTING VISIT and SURGERY INFORMATION SHEET   Now that surgery has been scheduled you can anticipate several phone calls from [facility]  services. A pharmacy technician will call you to verify your current list of medications taken at home.               The Pre-Service Center will call to verify your insurance information and to give you billing estimates and information.             The Preadmit Testing Office will be calling to schedule a visit to obtain information for the anesthesia team and provide instructions on preparation for surgery.  What can you expect for the Preadmit Testing Visit: Appointments may be scheduled in-person or by telephone.  If a telephone visit is scheduled, you may be asked to come into the office to have lab tests or other studies performed.   This visit will not be completed any greater than 14 days prior to your surgery.  If your surgery has been scheduled for a future date, please do not be alarmed if we have not contacted you to schedule an appointment more than a month prior to the surgery date.    Please be prepared to provide the following information during this appointment:            -Personal medical history                                               -Medication and allergy list            -Any history of problems with anesthesia              -Recent lab work or diagnostic studies            -Please notify us  of any needs we should be aware of to provide the best care possible           -You will be provided with instructions on how to prepare for your surgery.    On The Day of Surgery:  You must have a driver to take you home after surgery, you will be asked not to drive for 24 hours following surgery.  Taxi, Gisele and non-medical transport will not be acceptable means of transportation unless you have a responsible individual who will be traveling with you.  Visitors in the surgical area:   2 people will be able to visit you in your room once your preparation for surgery has been completed. During surgery, your visitors will be asked to wait in the Surgery Waiting Area.  It is not a  requirement for them to stay, if they prefer to leave and come back.  Your visitor(s) will be given an update once the surgery has been completed.  No visitors are allowed in the initial recovery room to respect patient privacy and safety.  Once you are more awake and transfer to the secondary recovery area, or are transferred to an inpatient room, visitors will again be able to see you.  To respect and protect your privacy: We will ask on the day of surgery who your driver will be and what the contact number for that individual will be. We will ask if it is okay to share information with this individual, or if there is an alternative individual that  we, or the surgeon, should contact to provide updates and information. If family or friends come to the surgical information desk requesting information about you, who you have not listed with us , no information will be given.   It may be helpful to designate someone as the main contact who will be responsible for updating your other friends and family.    PREADMIT TESTING OFFICE: [phone] SAME DAY SURGERY: [phone] We look forward to caring for you before and throughout the process of your surgery.

## 2024-05-05 NOTE — Telephone Encounter (Signed)
 Advised. Verbalized understanding. Previous rx will be cancelled

## 2024-05-08 ENCOUNTER — Ambulatory Visit: Admitting: Physician Assistant

## 2024-05-08 ENCOUNTER — Ambulatory Visit: Admitting: Neurosurgery

## 2024-05-15 ENCOUNTER — Encounter
Admission: RE | Admit: 2024-05-15 | Discharge: 2024-05-15 | Disposition: A | Discharge status: Home / Self Care | Source: Ambulatory Visit | Attending: Neurosurgery | Admitting: Neurosurgery

## 2024-05-15 ENCOUNTER — Other Ambulatory Visit: Payer: Self-pay

## 2024-05-15 VITALS — BP 118/50 | HR 66 | Temp 97.9°F | Resp 18 | Ht 67.0 in | Wt 166.8 lb

## 2024-05-15 DIAGNOSIS — N1832 Chronic kidney disease, stage 3b: Secondary | ICD-10-CM | POA: Diagnosis not present

## 2024-05-15 DIAGNOSIS — Z01818 Encounter for other preprocedural examination: Secondary | ICD-10-CM

## 2024-05-15 DIAGNOSIS — M4316 Spondylolisthesis, lumbar region: Secondary | ICD-10-CM | POA: Insufficient documentation

## 2024-05-15 DIAGNOSIS — Z01812 Encounter for preprocedural laboratory examination: Secondary | ICD-10-CM | POA: Insufficient documentation

## 2024-05-15 DIAGNOSIS — M48062 Spinal stenosis, lumbar region with neurogenic claudication: Secondary | ICD-10-CM | POA: Insufficient documentation

## 2024-05-15 HISTORY — DX: Polyneuropathy, unspecified: G62.9

## 2024-05-15 LAB — URINALYSIS, COMPLETE (UACMP) WITH MICROSCOPIC
Bilirubin Urine: NEGATIVE
Glucose, UA: NEGATIVE mg/dL
Hgb urine dipstick: NEGATIVE
Ketones, ur: NEGATIVE mg/dL
Leukocytes,Ua: NEGATIVE
Nitrite: NEGATIVE
Protein, ur: NEGATIVE mg/dL
RBC / HPF: 0 RBC/hpf (ref 0–5)
Specific Gravity, Urine: 1.008 (ref 1.005–1.030)
pH: 5 (ref 5.0–8.0)

## 2024-05-15 LAB — BASIC METABOLIC PANEL WITH GFR
Anion gap: 8 (ref 5–15)
BUN: 18 mg/dL (ref 8–23)
CO2: 21 mmol/L — ABNORMAL LOW (ref 22–32)
Calcium: 8.9 mg/dL (ref 8.9–10.3)
Chloride: 110 mmol/L (ref 98–111)
Creatinine, Ser: 1.31 mg/dL — ABNORMAL HIGH (ref 0.44–1.00)
GFR, Estimated: 45 mL/min — ABNORMAL LOW (ref >60)
Glucose, Bld: 104 mg/dL — ABNORMAL HIGH (ref 70–99)
Potassium: 4.3 mmol/L (ref 3.5–5.1)
Sodium: 139 mmol/L (ref 135–145)

## 2024-05-15 LAB — SURGICAL PCR SCREEN
MRSA, PCR: NEGATIVE
Staphylococcus aureus: NEGATIVE

## 2024-05-15 NOTE — Patient Instructions (Signed)
 Your procedure is scheduled on: Report to the Registration Desk on the 1st floor of the Medical Mall. To find out your arrival time, please call [phone] between 1PM - 3PM on: If your arrival time is 6:00 am, do not arrive before that time as the Medical Mall entrance doors do not open until 6:00 am.  REMEMBER: Instructions that are not followed completely may result in serious medical risk, up to and including death; or upon the discretion of your surgeon and anesthesiologist your surgery may need to be rescheduled.  Do not eat food after midnight the night before surgery.  No gum chewing or hard candies.  You may however, drink CLEAR liquids up to 2 hours before you are scheduled to arrive for your surgery. Do not drink anything within 2 hours of your scheduled arrival time.  Clear liquids include: - water  - apple juice without pulp - gatorade (not RED colors) - black coffee or tea (Do NOT add milk or creamers to the coffee or tea) Do NOT drink anything that is not on this list.  Stop ANY OVER THE COUNTER supplements until after surgery.  Continue taking all of your other prescription medications up until the day of surgery.  ON THE DAY OF SURGERY ONLY TAKE THESE MEDICATIONS WITH SIPS OF WATER:  atorvastatin  (LIPITOR) carvedilol  (COREG )  oxyCODONE -acetaminophen  (PERCOCET)  pantoprazole  (PROTONIX )  venlafaxine  XR (EFFEXOR -XR)   Use inhalers on the day of surgery and bring to the hospital.  No Alcohol for 24 hours before or after surgery.  No Smoking including e-cigarettes for 24 hours before surgery.  No chewable tobacco products for at least 6 hours before surgery.  No nicotine  patches on the day of surgery.  Do not use any recreational drugs for at least a week (preferably 2 weeks) before your surgery.  Please be advised that the combination of cocaine and anesthesia may have negative outcomes, up to and including death. If you test positive for cocaine, your  surgery will be cancelled.  On the morning of surgery brush your teeth with toothpaste and water, you may rinse your mouth with mouthwash if you wish. Do not swallow any toothpaste or mouthwash.  Use CHG Soap or wipes as directed on instruction sheet.  Do not wear jewelry, make-up, hairpins, clips or nail polish.  For welded (permanent) jewelry: bracelets, anklets, waist bands, etc.  Please have this removed prior to surgery.  If it is not removed, there is a chance that hospital personnel will need to cut it off on the day of surgery.  Do not wear lotions, powders, or perfumes.   Do not shave body hair from the neck down 48 hours before surgery.  Contact lenses, hearing aids and dentures may not be worn into surgery.  Do not bring valuables to the hospital. [facility] is not responsible for any missing/lost belongings or valuables.   Bring your C-PAP to the hospital in case you may have to spend the night.   Notify your doctor if there is any change in your medical condition (cold, fever, infection).  Wear comfortable clothing (specific to your surgery type) to the hospital.  After surgery, you can help prevent lung complications by doing breathing exercises.  Take deep breaths and cough every 1-2 hours. Your doctor may order a device called an Incentive Spirometer to help you take deep breaths. When coughing or sneezing, hold a pillow firmly against your incision with both hands. This is called "splinting." Doing this helps protect  your incision. It also decreases belly discomfort.  If you are being admitted to the hospital overnight, leave your suitcase in the car. After surgery it may be brought to your room.  In case of increased patient census, it may be necessary for you, the patient, to continue your postoperative care in the Same Day Surgery department.  If you are being discharged the day of surgery, you will not be allowed to drive home. You will need a responsible  individual to drive you home and stay with you for 24 hours after surgery.   If you are taking public transportation, you will need to have a responsible individual with you.  Please call the Pre-admissions Testing Dept. at [phone] if you have any questions about these instructions.  Surgery Visitation Policy:  Patients having surgery or a procedure may have two visitors.  Children under the age of 40 must have an adult with them who is not the patient.  Inpatient Visitation:    Visiting hours are 7 a.m. to 8 p.m. Up to four visitors are allowed at one time in a patient room. The visitors may rotate out with other people during the day.  One visitor age 43 or older may stay with the patient overnight and must be in the room by 8 p.m.  Merchandiser, retail to address health-related social needs:  [email or web address]    Pre-operative 5 CHG Bath Instructions   You can play a key role in reducing the risk of infection after surgery. Your skin needs to be as free of germs as possible. You can reduce the number of germs on your skin by washing with CHG (chlorhexidine  gluconate) soap before surgery. CHG is an antiseptic soap that kills germs and continues to kill germs even after washing.   DO NOT use if you have an allergy to chlorhexidine /CHG or antibacterial soaps. If your skin becomes reddened or irritated, stop using the CHG and notify one of our RNs at [phone].   Please shower with the CHG soap starting 4 days before surgery using the following schedule:     Please keep in mind the following:  DO NOT shave, including legs and underarms, starting the day of your first shower.   You may shave your face at any point before/day of surgery.  Place clean sheets on your bed the day you start using CHG soap. Use a clean washcloth (not used since being washed) for each shower. DO NOT sleep with pets once you start using the CHG.   CHG Shower Instructions:   If you choose to wash your hair and private area, wash first with your normal shampoo/soap.  After you use shampoo/soap, rinse your hair and body thoroughly to remove shampoo/soap residue.  Turn the water OFF and apply about 3 tablespoons (45 ml) of CHG soap to a CLEAN washcloth.  Apply CHG soap ONLY FROM YOUR NECK DOWN TO YOUR TOES (washing for 3-5 minutes)  DO NOT use CHG soap on face, private areas, open wounds, or sores.  Pay special attention to the area where your surgery is being performed.  If you are having back surgery, having someone wash your back for you may be helpful. Wait 2 minutes after CHG soap is applied, then you may rinse off the CHG soap.  Pat dry with a clean towel  Put on clean clothes/pajamas   If you choose to wear lotion, please use ONLY the CHG-compatible lotions on the back of this paper.  Additional instructions for the day of surgery: DO NOT APPLY any lotions, deodorants, cologne, or perfumes.   Put on clean/comfortable clothes.  Brush your teeth.  Ask your nurse before applying any prescription medications to the skin.      CHG Compatible Lotions   Aveeno Moisturizing lotion  Cetaphil Moisturizing Cream  Cetaphil Moisturizing Lotion  Clairol Herbal Essence Moisturizing Lotion, Dry Skin  Clairol Herbal Essence Moisturizing Lotion, Extra Dry Skin  Clairol Herbal Essence Moisturizing Lotion, Normal Skin  Curel Age Defying Therapeutic Moisturizing Lotion with Alpha Hydroxy  Curel Extreme Care Body Lotion  Curel Soothing Hands Moisturizing Hand Lotion  Curel Therapeutic Moisturizing Cream, Fragrance-Free  Curel Therapeutic Moisturizing Lotion, Fragrance-Free  Curel Therapeutic Moisturizing Lotion, Original Formula  Eucerin Daily Replenishing Lotion  Eucerin Dry Skin Therapy Plus Alpha Hydroxy Crme  Eucerin Dry Skin Therapy Plus Alpha Hydroxy Lotion  Eucerin Original Crme  Eucerin Original Lotion  Eucerin Plus Crme Eucerin Plus Lotion   Eucerin TriLipid Replenishing Lotion  Keri Anti-Bacterial Hand Lotion  Keri Deep Conditioning Original Lotion Dry Skin Formula Softly Scented  Keri Deep Conditioning Original Lotion, Fragrance Free Sensitive Skin Formula  Keri Lotion Fast Absorbing Fragrance Free Sensitive Skin Formula  Keri Lotion Fast Absorbing Softly Scented Dry Skin Formula  Keri Original Lotion  Keri Skin Renewal Lotion Keri Silky Smooth Lotion  Keri Silky Smooth Sensitive Skin Lotion  Nivea Body Creamy Conditioning Oil  Nivea Body Extra Enriched Teacher, adult education Moisturizing Lotion Nivea Crme  Nivea Skin Firming Lotion  NutraDerm 30 Skin Lotion  NutraDerm Skin Lotion  NutraDerm Therapeutic Skin Cream  NutraDerm Therapeutic Skin Lotion  ProShield Protective Hand Cream  Provon moisturizing lotion

## 2024-05-17 LAB — TYPE AND SCREEN
ABO/RH(D): O POS
Antibody Screen: NEGATIVE

## 2024-05-19 ENCOUNTER — Ambulatory Visit: Admitting: Podiatry

## 2024-05-19 DIAGNOSIS — B351 Tinea unguium: Secondary | ICD-10-CM | POA: Diagnosis not present

## 2024-05-19 DIAGNOSIS — M79674 Pain in right toe(s): Secondary | ICD-10-CM | POA: Diagnosis not present

## 2024-05-19 DIAGNOSIS — M79675 Pain in left toe(s): Secondary | ICD-10-CM

## 2024-05-19 DIAGNOSIS — G629 Polyneuropathy, unspecified: Secondary | ICD-10-CM | POA: Diagnosis not present

## 2024-05-22 ENCOUNTER — Telehealth: Payer: Self-pay

## 2024-05-22 DIAGNOSIS — Z87891 Personal history of nicotine dependence: Secondary | ICD-10-CM

## 2024-05-22 DIAGNOSIS — Z122 Encounter for screening for malignant neoplasm of respiratory organs: Secondary | ICD-10-CM

## 2024-05-22 DIAGNOSIS — F17210 Nicotine dependence, cigarettes, uncomplicated: Secondary | ICD-10-CM

## 2024-05-22 NOTE — Telephone Encounter (Signed)
 Lung Cancer Screening Narrative/Criteria Questionnaire (Cigarette Smokers Only- No Cigars/Pipes/vapes)   Gabrielle Pineda   SDMV:06/02/2024 at 11:45 am [name]      [birth year]               LDCT: 07/07/2024 at 10:00 am-delayed to spine surgery on 05/23/2024    68 y.o.   Phone: [phone]  Lung Screening Narrative (confirm age 35-77 yrs Medicare / 50-80 yrs Private pay insurance)   Insurance information: [facility]   Referring Provider: Tamea   This screening involves an initial phone call with a team member from our program. It is called a shared decision making visit. The initial meeting is required by  insurance and Medicare to make sure you understand the program. This appointment takes about 15-20 minutes to complete. You will complete the screening scan at your scheduled date/time.  This scan takes about 5-10 minutes to complete. You can eat and drink normally before and after the scan.  Criteria questions for Lung Cancer Screening:   Are you a current or former smoker? Former Age began smoking: 16   If you are a former smoker, what year did you quit smoking? Quit 2022 (within 15 yrs)   To calculate your smoking history, I need an accurate estimate of how many packs of cigarettes you smoked per day and for how many years. (Not just the number of PPD you are now smoking)   Years smoking 48 x Packs per day 2 = Pack years 96   (at least 20 pack yrs)   (Make sure they understand that we need to know how much they have smoked in the past, not just the number of PPD they are smoking now)  Do you have a personal history of cancer?  No    Do you have a family history of cancer? Yes  (cancer type and and relative) Father had pancreatic cancer. Uncle had unknown type.   Are you coughing up blood?  No  Have you had unexplained weight loss of 15 lbs or more in the last 6 months? No  It looks like you meet all criteria.  When would be a good time for us  to schedule you for this  screening?   Additional information: N/A

## 2024-05-23 ENCOUNTER — Ambulatory Visit: Admitting: Anesthesiology

## 2024-05-23 ENCOUNTER — Ambulatory Visit

## 2024-05-23 ENCOUNTER — Other Ambulatory Visit: Payer: Self-pay

## 2024-05-23 ENCOUNTER — Encounter: Payer: Self-pay | Admitting: Neurosurgery

## 2024-05-23 ENCOUNTER — Ambulatory Visit: Payer: Self-pay | Admitting: Urgent Care

## 2024-05-23 ENCOUNTER — Observation Stay
Admission: RE | Admit: 2024-05-23 | Discharge: 2024-05-25 | Disposition: A | Discharge status: Home Health | Attending: Neurosurgery | Admitting: Neurosurgery

## 2024-05-23 ENCOUNTER — Encounter
Admission: RE | Disposition: A | Discharge status: Home Health | Payer: Self-pay | Source: Home / Self Care | Attending: Neurosurgery

## 2024-05-23 DIAGNOSIS — M4316 Spondylolisthesis, lumbar region: Principal | ICD-10-CM | POA: Insufficient documentation

## 2024-05-23 DIAGNOSIS — J449 Chronic obstructive pulmonary disease, unspecified: Secondary | ICD-10-CM | POA: Diagnosis not present

## 2024-05-23 DIAGNOSIS — M5416 Radiculopathy, lumbar region: Secondary | ICD-10-CM | POA: Insufficient documentation

## 2024-05-23 DIAGNOSIS — Z96641 Presence of right artificial hip joint: Secondary | ICD-10-CM | POA: Insufficient documentation

## 2024-05-23 DIAGNOSIS — M48062 Spinal stenosis, lumbar region with neurogenic claudication: Principal | ICD-10-CM | POA: Insufficient documentation

## 2024-05-23 DIAGNOSIS — Z780 Asymptomatic menopausal state: Secondary | ICD-10-CM | POA: Diagnosis not present

## 2024-05-23 DIAGNOSIS — Z79899 Other long term (current) drug therapy: Secondary | ICD-10-CM | POA: Insufficient documentation

## 2024-05-23 DIAGNOSIS — Z87891 Personal history of nicotine dependence: Secondary | ICD-10-CM | POA: Insufficient documentation

## 2024-05-23 DIAGNOSIS — Z981 Arthrodesis status: Secondary | ICD-10-CM | POA: Diagnosis not present

## 2024-05-23 DIAGNOSIS — Z01818 Encounter for other preprocedural examination: Secondary | ICD-10-CM

## 2024-05-23 DIAGNOSIS — I10 Essential (primary) hypertension: Secondary | ICD-10-CM | POA: Insufficient documentation

## 2024-05-23 DIAGNOSIS — M545 Low back pain, unspecified: Secondary | ICD-10-CM | POA: Diagnosis present

## 2024-05-23 HISTORY — PX: APPLICATION OF INTRAOPERATIVE CT SCAN: SHX6668

## 2024-05-23 HISTORY — PX: TRANSFORAMINAL LUMBAR INTERBODY FUSION (TLIF) WITH PEDICLE SCREW FIXATION 1 LEVEL: SHX6141

## 2024-05-23 SURGERY — TRANSFORAMINAL LUMBAR INTERBODY FUSION (TLIF) WITH PEDICLE SCREW FIXATION 1 LEVEL
Anesthesia: General | Site: Spine Lumbar

## 2024-05-23 MED ORDER — ONDANSETRON HCL 4 MG/2ML IJ SOLN: 4.0000 mg | Freq: Four times a day (QID) | INTRAMUSCULAR | Status: DC | PRN

## 2024-05-23 MED ORDER — SENNA 8.6 MG PO TABS
1.0000 | ORAL_TABLET | Freq: Two times a day (BID) | ORAL | Status: DC
  Administered 2024-05-23 – 2024-05-25 (×4): 8.6 mg via ORAL
  Filled 2024-05-23 (×4): qty 1

## 2024-05-23 MED ORDER — REMIFENTANIL HCL 1 MG IV SOLR
INTRAVENOUS | Status: DC | PRN
  Administered 2024-05-23: .08 ug/kg/min via INTRAVENOUS

## 2024-05-23 MED ORDER — FENTANYL CITRATE (PF) 100 MCG/2ML IJ SOLN
INTRAMUSCULAR | Status: DC | PRN
  Administered 2024-05-23 (×2): 50 ug via INTRAVENOUS

## 2024-05-23 MED ORDER — 0.9 % SODIUM CHLORIDE (POUR BTL) OPTIME
TOPICAL | Status: DC | PRN
  Administered 2024-05-23: 500 mL

## 2024-05-23 MED ORDER — REMIFENTANIL HCL 1 MG IV SOLR
INTRAVENOUS | Status: AC
  Filled 2024-05-23: qty 1000

## 2024-05-23 MED ORDER — SUCCINYLCHOLINE CHLORIDE 200 MG/10ML IV SOSY
PREFILLED_SYRINGE | INTRAVENOUS | Status: DC | PRN
  Administered 2024-05-23: 80 mg via INTRAVENOUS

## 2024-05-23 MED ORDER — ACETAMINOPHEN 650 MG RE SUPP: 650.0000 mg | RECTAL | Status: DC | PRN

## 2024-05-23 MED ORDER — MENTHOL 3 MG MT LOZG: 1.0000 | LOZENGE | OROMUCOSAL | Status: DC | PRN

## 2024-05-23 MED ORDER — PROPOFOL 10 MG/ML IV BOLUS
INTRAVENOUS | Status: DC | PRN
  Administered 2024-05-23: 140 mg via INTRAVENOUS
  Administered 2024-05-23: 125 ug/kg/min via INTRAVENOUS

## 2024-05-23 MED ORDER — PHENOL 1.4 % MT LIQD: 1.0000 | OROMUCOSAL | Status: DC | PRN

## 2024-05-23 MED ORDER — TIZANIDINE HCL 4 MG PO TABS
4.0000 mg | ORAL_TABLET | Freq: Four times a day (QID) | ORAL | Status: DC | PRN
  Administered 2024-05-24 – 2024-05-25 (×4): 4 mg via ORAL
  Filled 2024-05-23 (×4): qty 1

## 2024-05-23 MED ORDER — OXYCODONE HCL 5 MG PO TABS
ORAL_TABLET | ORAL | Status: AC
  Filled 2024-05-23: qty 1

## 2024-05-23 MED ORDER — SODIUM CHLORIDE 0.9% FLUSH: 3.0000 mL | INTRAVENOUS | Status: DC | PRN

## 2024-05-23 MED ORDER — MONTELUKAST SODIUM 10 MG PO TABS
10.0000 mg | ORAL_TABLET | Freq: Every day | ORAL | Status: DC
Start: 2024-05-23 — End: 2024-05-25
  Administered 2024-05-23 – 2024-05-24 (×2): 10 mg via ORAL
  Filled 2024-05-23 (×2): qty 1

## 2024-05-23 MED ORDER — LIDOCAINE HCL (CARDIAC) PF 100 MG/5ML IV SOSY
PREFILLED_SYRINGE | INTRAVENOUS | Status: DC | PRN
  Administered 2024-05-23 (×2): 100 mg via INTRAVENOUS

## 2024-05-23 MED ORDER — ACETAMINOPHEN 325 MG PO TABS: 650.0000 mg | ORAL_TABLET | ORAL | Status: DC | PRN

## 2024-05-23 MED ORDER — SURGIFLO WITH THROMBIN (HEMOSTATIC MATRIX KIT) OPTIME
TOPICAL | Status: DC | PRN
  Administered 2024-05-23: 1 via TOPICAL

## 2024-05-23 MED ORDER — PROPOFOL 1000 MG/100ML IV EMUL
INTRAVENOUS | Status: AC
  Filled 2024-05-23: qty 100

## 2024-05-23 MED ORDER — MAGNESIUM CITRATE PO SOLN: 1.0000 | Freq: Once | ORAL | Status: DC | PRN

## 2024-05-23 MED ORDER — OXYCODONE HCL 5 MG PO TABS
5.0000 mg | ORAL_TABLET | ORAL | Status: DC | PRN
  Administered 2024-05-23: 5 mg via ORAL

## 2024-05-23 MED ORDER — DEXMEDETOMIDINE HCL IN NACL 80 MCG/20ML IV SOLN
INTRAVENOUS | Status: DC | PRN
Start: 2024-05-23 — End: 2024-05-24
  Administered 2024-05-23: 4 ug via INTRAVENOUS
  Administered 2024-05-23 (×2): 8 ug via INTRAVENOUS

## 2024-05-23 MED ORDER — OXYCODONE HCL 5 MG PO TABS: 5.0000 mg | ORAL_TABLET | Freq: Once | ORAL | Status: DC | PRN

## 2024-05-23 MED ORDER — MIDAZOLAM HCL 2 MG/2ML IJ SOLN
INTRAMUSCULAR | Status: AC
Start: 2024-05-23 — End: 2024-05-23
  Filled 2024-05-23: qty 2

## 2024-05-23 MED ORDER — HYDROMORPHONE HCL 1 MG/ML IJ SOLN
INTRAMUSCULAR | Status: DC | PRN
  Administered 2024-05-23 (×2): .5 mg via INTRAVENOUS
  Administered 2024-05-23: 1 mg via INTRAVENOUS

## 2024-05-23 MED ORDER — ENOXAPARIN SODIUM 40 MG/0.4ML IJ SOSY
40.0000 mg | PREFILLED_SYRINGE | INTRAMUSCULAR | Status: DC
  Administered 2024-05-24 – 2024-05-25 (×2): 40 mg via SUBCUTANEOUS
  Filled 2024-05-23 (×2): qty 0.4

## 2024-05-23 MED ORDER — GABAPENTIN 100 MG PO CAPS
ORAL_CAPSULE | ORAL | Status: AC
  Filled 2024-05-23: qty 1

## 2024-05-23 MED ORDER — HYDROMORPHONE HCL 1 MG/ML IJ SOLN
INTRAMUSCULAR | Status: AC
  Filled 2024-05-23: qty 1

## 2024-05-23 MED ORDER — MIDAZOLAM HCL 2 MG/2ML IJ SOLN
INTRAMUSCULAR | Status: DC | PRN
  Administered 2024-05-23: 2 mg via INTRAVENOUS

## 2024-05-23 MED ORDER — FENTANYL CITRATE (PF) 100 MCG/2ML IJ SOLN
INTRAMUSCULAR | Status: AC
  Filled 2024-05-23: qty 2

## 2024-05-23 MED ORDER — VANCOMYCIN HCL IN DEXTROSE 1-5 GM/200ML-% IV SOLN
1000.0000 mg | Freq: Once | INTRAVENOUS | Status: AC
  Administered 2024-05-23: 1000 mg via INTRAVENOUS

## 2024-05-23 MED ORDER — POLYETHYLENE GLYCOL 3350 17 G PO PACK: 17.0000 g | PACK | Freq: Every day | ORAL | Status: DC | PRN

## 2024-05-23 MED ORDER — CHLORHEXIDINE GLUCONATE 0.12 % MT SOLN
15.0000 mL | Freq: Once | OROMUCOSAL | Status: AC
  Administered 2024-05-23: 15 mL via OROMUCOSAL

## 2024-05-23 MED ORDER — ALBUTEROL SULFATE (2.5 MG/3ML) 0.083% IN NEBU
3.0000 mL | INHALATION_SOLUTION | Freq: Four times a day (QID) | RESPIRATORY_TRACT | Status: DC | PRN
Start: 2024-05-23 — End: 2024-05-25

## 2024-05-23 MED ORDER — BUPIVACAINE-EPINEPHRINE (PF) 0.5% -1:200000 IJ SOLN
INTRAMUSCULAR | Status: DC | PRN
  Administered 2024-05-23: 10 mL

## 2024-05-23 MED ORDER — SORBITOL 70 % SOLN: 30.0000 mL | Freq: Every day | Status: DC | PRN

## 2024-05-23 MED ORDER — POTASSIUM CHLORIDE CRYS ER 20 MEQ PO TBCR
20.0000 meq | EXTENDED_RELEASE_TABLET | Freq: Every morning | ORAL | Status: DC
  Administered 2024-05-24 – 2024-05-25 (×2): 20 meq via ORAL
  Filled 2024-05-23 (×2): qty 1

## 2024-05-23 MED ORDER — GABAPENTIN 300 MG PO CAPS
400.0000 mg | ORAL_CAPSULE | Freq: Every day | ORAL | Status: DC
  Administered 2024-05-23 – 2024-05-24 (×2): 400 mg via ORAL

## 2024-05-23 MED ORDER — VANCOMYCIN HCL IN DEXTROSE 1-5 GM/200ML-% IV SOLN
INTRAVENOUS | Status: AC
  Filled 2024-05-23: qty 200

## 2024-05-23 MED ORDER — EZETIMIBE 10 MG PO TABS
10.0000 mg | ORAL_TABLET | Freq: Every day | ORAL | Status: DC
  Administered 2024-05-24 – 2024-05-25 (×2): 10 mg via ORAL

## 2024-05-23 MED ORDER — SODIUM CHLORIDE (PF) 0.9 % IJ SOLN
INTRAMUSCULAR | Status: AC
  Filled 2024-05-23: qty 20

## 2024-05-23 MED ORDER — SODIUM CHLORIDE (PF) 0.9 % IJ SOLN
INTRAMUSCULAR | Status: DC | PRN
Start: 2024-05-23 — End: 2024-05-23
  Administered 2024-05-23: 60 mL via INTRAMUSCULAR

## 2024-05-23 MED ORDER — SODIUM CHLORIDE (PF) 0.9 % IJ SOLN
INTRAMUSCULAR | Status: DC | PRN
  Administered 2024-05-23: 60 mL via INTRAMUSCULAR

## 2024-05-23 MED ORDER — HYDROMORPHONE HCL 1 MG/ML IJ SOLN: 0.5000 mg | INTRAMUSCULAR | Status: AC | PRN

## 2024-05-23 MED ORDER — BUPIVACAINE HCL (PF) 0.5 % IJ SOLN
INTRAMUSCULAR | Status: AC
  Filled 2024-05-23: qty 30

## 2024-05-23 MED ORDER — LOSARTAN POTASSIUM 50 MG PO TABS
25.0000 mg | ORAL_TABLET | Freq: Every day | ORAL | Status: DC
  Administered 2024-05-24 – 2024-05-25 (×2): 25 mg via ORAL
  Filled 2024-05-23 (×2): qty 1

## 2024-05-23 MED ORDER — BUPIVACAINE LIPOSOME 1.3 % IJ SUSP
INTRAMUSCULAR | Status: AC
  Filled 2024-05-23: qty 20

## 2024-05-23 MED ORDER — ACETAMINOPHEN 10 MG/ML IV SOLN
INTRAVENOUS | Status: AC
  Filled 2024-05-23: qty 100

## 2024-05-23 MED ORDER — ACETAMINOPHEN 10 MG/ML IV SOLN
INTRAVENOUS | Status: DC | PRN
  Administered 2024-05-23: 1000 mg via INTRAVENOUS

## 2024-05-23 MED ORDER — PANTOPRAZOLE SODIUM 40 MG PO TBEC
40.0000 mg | DELAYED_RELEASE_TABLET | Freq: Two times a day (BID) | ORAL | Status: DC
  Administered 2024-05-23 – 2024-05-25 (×4): 40 mg via ORAL
  Filled 2024-05-23 (×4): qty 1

## 2024-05-23 MED ORDER — CEFAZOLIN SODIUM-DEXTROSE 2-4 GM/100ML-% IV SOLN
INTRAVENOUS | Status: AC
  Filled 2024-05-23: qty 100

## 2024-05-23 MED ORDER — CEFAZOLIN SODIUM-DEXTROSE 2-4 GM/100ML-% IV SOLN
2.0000 g | INTRAVENOUS | Status: AC
  Administered 2024-05-23: 2 g via INTRAVENOUS

## 2024-05-23 MED ORDER — DEXAMETHASONE SODIUM PHOSPHATE 10 MG/ML IJ SOLN
INTRAMUSCULAR | Status: DC | PRN
  Administered 2024-05-23: 10 mg via INTRAVENOUS

## 2024-05-23 MED ORDER — CARVEDILOL 12.5 MG PO TABS
12.5000 mg | ORAL_TABLET | Freq: Two times a day (BID) | ORAL | Status: DC
  Administered 2024-05-24 – 2024-05-25 (×3): 12.5 mg via ORAL
  Filled 2024-05-23 (×3): qty 4

## 2024-05-23 MED ORDER — SODIUM CHLORIDE 0.9 % IV SOLN: 250.0000 mL | INTRAVENOUS | Status: AC

## 2024-05-23 MED ORDER — OXYCODONE HCL 5 MG PO TABS
10.0000 mg | ORAL_TABLET | ORAL | Status: DC | PRN
  Administered 2024-05-23 – 2024-05-24 (×3): 10 mg via ORAL
  Filled 2024-05-23 (×2): qty 2

## 2024-05-23 MED ORDER — ORAL CARE MOUTH RINSE: 15.0000 mL | Freq: Once | OROMUCOSAL | Status: AC

## 2024-05-23 MED ORDER — OXYCODONE HCL 5 MG/5ML PO SOLN: 5.0000 mg | Freq: Once | ORAL | Status: DC | PRN

## 2024-05-23 MED ORDER — LACTATED RINGERS IV SOLN: INTRAVENOUS | Status: DC

## 2024-05-23 MED ORDER — SODIUM CHLORIDE 0.9% FLUSH
3.0000 mL | Freq: Two times a day (BID) | INTRAVENOUS | Status: DC
  Administered 2024-05-23 – 2024-05-24 (×2): 3 mL via INTRAVENOUS

## 2024-05-23 MED ORDER — BUDESON-GLYCOPYRROL-FORMOTEROL 160-9-4.8 MCG/ACT IN AERO
2.0000 | INHALATION_SPRAY | Freq: Two times a day (BID) | RESPIRATORY_TRACT | Status: DC
  Administered 2024-05-23 – 2024-05-25 (×4): 2 via RESPIRATORY_TRACT
  Filled 2024-05-23 (×2): qty 5.9

## 2024-05-23 MED ORDER — LORATADINE 10 MG PO TABS
10.0000 mg | ORAL_TABLET | Freq: Every day | ORAL | Status: DC
  Administered 2024-05-23 – 2024-05-25 (×3): 10 mg via ORAL
  Filled 2024-05-23 (×3): qty 1

## 2024-05-23 MED ORDER — GABAPENTIN 300 MG PO CAPS
ORAL_CAPSULE | ORAL | Status: AC
  Filled 2024-05-23: qty 1

## 2024-05-23 MED ORDER — ACETAMINOPHEN 500 MG PO TABS
1000.0000 mg | ORAL_TABLET | Freq: Four times a day (QID) | ORAL | Status: DC
  Administered 2024-05-23 – 2024-05-25 (×6): 1000 mg via ORAL
  Filled 2024-05-23 (×7): qty 2

## 2024-05-23 MED ORDER — FERROUS SULFATE 325 (65 FE) MG PO TABS
325.0000 mg | ORAL_TABLET | Freq: Every day | ORAL | Status: DC
  Administered 2024-05-24 – 2024-05-25 (×2): 325 mg via ORAL
  Filled 2024-05-23 (×2): qty 1

## 2024-05-23 MED ORDER — SODIUM CHLORIDE (PF) 0.9 % IJ SOLN
INTRAMUSCULAR | Status: AC
Start: 2024-05-23 — End: 2024-05-23
  Filled 2024-05-23: qty 20

## 2024-05-23 MED ORDER — ATORVASTATIN CALCIUM 20 MG PO TABS
40.0000 mg | ORAL_TABLET | Freq: Every day | ORAL | Status: DC
  Administered 2024-05-23 – 2024-05-24 (×2): 40 mg via ORAL
  Filled 2024-05-23 (×2): qty 4

## 2024-05-23 MED ORDER — MIRTAZAPINE 15 MG PO TABS
15.0000 mg | ORAL_TABLET | Freq: Every day | ORAL | Status: DC
  Administered 2024-05-23 – 2024-05-24 (×2): 15 mg via ORAL
  Filled 2024-05-23 (×2): qty 1

## 2024-05-23 MED ORDER — IRRISEPT - 450ML BOTTLE WITH 0.05% CHG IN STERILE WATER, USP 99.95% OPTIME
TOPICAL | Status: DC | PRN
  Administered 2024-05-23: 450 mL via TOPICAL

## 2024-05-23 MED ORDER — ONDANSETRON HCL 4 MG PO TABS: 4.0000 mg | ORAL_TABLET | Freq: Four times a day (QID) | ORAL | Status: DC | PRN

## 2024-05-23 MED ORDER — FLUTICASONE PROPIONATE 50 MCG/ACT NA SUSP: 2.0000 | Freq: Two times a day (BID) | NASAL | Status: DC | PRN

## 2024-05-23 MED ORDER — PHENYLEPHRINE HCL (PRESSORS) 10 MG/ML IV SOLN
INTRAVENOUS | Status: DC | PRN
  Administered 2024-05-23 (×2): 100 ug via INTRAVENOUS

## 2024-05-23 MED ORDER — BUPIVACAINE LIPOSOME 1.3 % IJ SUSP
INTRAMUSCULAR | Status: AC
Start: 2024-05-23 — End: 2024-05-23
  Filled 2024-05-23: qty 20

## 2024-05-23 MED ORDER — ONDANSETRON HCL 4 MG/2ML IJ SOLN
INTRAMUSCULAR | Status: DC | PRN
  Administered 2024-05-23: 4 mg via INTRAVENOUS

## 2024-05-23 MED ORDER — DOCUSATE SODIUM 100 MG PO CAPS
100.0000 mg | ORAL_CAPSULE | Freq: Two times a day (BID) | ORAL | Status: DC
  Administered 2024-05-23 – 2024-05-25 (×4): 100 mg via ORAL
  Filled 2024-05-23 (×4): qty 1

## 2024-05-23 MED ORDER — BUPIVACAINE-EPINEPHRINE (PF) 0.5% -1:200000 IJ SOLN
INTRAMUSCULAR | Status: AC
  Filled 2024-05-23: qty 30

## 2024-05-23 MED ORDER — FENTANYL CITRATE (PF) 100 MCG/2ML IJ SOLN
25.0000 ug | INTRAMUSCULAR | Status: DC | PRN
  Administered 2024-05-23 (×2): 50 ug via INTRAVENOUS

## 2024-05-23 MED ORDER — VENLAFAXINE HCL ER 150 MG PO CP24
150.0000 mg | ORAL_CAPSULE | Freq: Every day | ORAL | Status: DC
  Administered 2024-05-24 – 2024-05-25 (×2): 150 mg via ORAL
  Filled 2024-05-23 (×2): qty 1

## 2024-05-23 MED ORDER — LEFLUNOMIDE 10 MG PO TABS
20.0000 mg | ORAL_TABLET | Freq: Every day | ORAL | Status: DC
  Administered 2024-05-23 – 2024-05-24 (×2): 20 mg via ORAL
  Filled 2024-05-23 (×3): qty 2

## 2024-05-23 MED ORDER — GLYCOPYRROLATE 0.2 MG/ML IJ SOLN
INTRAMUSCULAR | Status: DC | PRN
  Administered 2024-05-23: .2 mg via INTRAVENOUS

## 2024-05-23 MED ORDER — BUPIVACAINE HCL (PF) 0.5 % IJ SOLN
INTRAMUSCULAR | Status: AC
Start: 2024-05-23 — End: 2024-05-23
  Filled 2024-05-23: qty 30

## 2024-05-23 MED ORDER — CEFAZOLIN IN SODIUM CHLORIDE 2-0.9 GM/100ML-% IV SOLN
2.0000 g | Freq: Once | INTRAVENOUS | Status: DC
  Filled 2024-05-23: qty 100

## 2024-05-23 MED ORDER — TOPIRAMATE 25 MG PO TABS
25.0000 mg | ORAL_TABLET | Freq: Two times a day (BID) | ORAL | Status: DC
  Administered 2024-05-23 – 2024-05-24 (×3): 25 mg via ORAL
  Filled 2024-05-23 (×5): qty 1

## 2024-05-23 MED ORDER — HYDROMORPHONE HCL 1 MG/ML IJ SOLN
0.5000 mg | INTRAMUSCULAR | Status: AC | PRN
  Administered 2024-05-23 (×4): 0.5 mg via INTRAVENOUS

## 2024-05-23 MED ORDER — OXYCODONE HCL 5 MG PO TABS
ORAL_TABLET | ORAL | Status: AC
  Filled 2024-05-23: qty 2

## 2024-05-23 MED ORDER — CHLORHEXIDINE GLUCONATE 0.12 % MT SOLN
OROMUCOSAL | Status: AC
  Filled 2024-05-23: qty 15

## 2024-05-23 MED ORDER — IPRATROPIUM BROMIDE 0.03 % NA SOLN: 2.0000 | Freq: Three times a day (TID) | NASAL | Status: DC | PRN

## 2024-05-23 SURGICAL SUPPLY — 59 items
ALLOGRAFT BONE FIBER KORE 10CC (Bone Implant) IMPLANT
BASIN KIT SINGLE STR (MISCELLANEOUS) ×2 IMPLANT
BRUSH SCRUB EZ 4% CHG (MISCELLANEOUS) ×2 IMPLANT
BUR NEURO DRILL SOFT 3.0X3.8M (BURR) ×2 IMPLANT
CHLORAPREP W/TINT 26 (MISCELLANEOUS) ×2 IMPLANT
DERMABOND ADVANCED .7 DNX12 (GAUZE/BANDAGES/DRESSINGS) ×2 IMPLANT
DRAPE C-ARM 42X72 X-RAY (DRAPES) IMPLANT
DRAPE C-ARM XRAY 36X54 (DRAPES) IMPLANT
DRAPE LAPAROTOMY 100X77 ABD (DRAPES) ×2 IMPLANT
DRAPE SCAN PATIENT (DRAPES) ×2 IMPLANT
DRAPE SPINE LEICA/WILD 54X150 (DRAPES) IMPLANT
DRSG OPSITE POSTOP 3X4 (GAUZE/BANDAGES/DRESSINGS) IMPLANT
DRSG OPSITE POSTOP 4X8 (GAUZE/BANDAGES/DRESSINGS) IMPLANT
DRSG TEGADERM 4X4.75 (GAUZE/BANDAGES/DRESSINGS) IMPLANT
ELECTRODE EZSTD 165MM 6.5IN (MISCELLANEOUS) IMPLANT
ELECTRODE REM PT RTRN 9FT ADLT (ELECTROSURGICAL) ×2 IMPLANT
EVACUATOR 1/8 PVC DRAIN (DRAIN) IMPLANT
EX-PIN ORTHOLOCK NAV 4X150 (PIN) IMPLANT
FEE CVG SUPP BRAINLAB NG SPNE (MISCELLANEOUS) IMPLANT
FEE INTRAOP CADWELL SUPPLY NCS (MISCELLANEOUS) IMPLANT
FEE INTRAOP MONITOR IMPULS NCS (MISCELLANEOUS) IMPLANT
GAUZE 4X4 16PLY ~~LOC~~+RFID DBL (SPONGE) ×2 IMPLANT
GLOVE BIOGEL PI IND STRL 7.0 (GLOVE) ×2 IMPLANT
GLOVE BIOGEL PI IND STRL 8 (GLOVE) ×2 IMPLANT
GLOVE SRG 8 PF TXTR STRL LF DI (GLOVE) ×2 IMPLANT
GLOVE SURG SYN 7.0 PF PI (GLOVE) ×4 IMPLANT
GLOVE SURG SYN 7.5 PF PI (GLOVE) ×4 IMPLANT
GOWN SRG LRG LVL 4 IMPRV REINF (GOWNS) ×2 IMPLANT
GOWN SRG XL LVL 3 NONREINFORCE (GOWNS) ×2 IMPLANT
HOLDER FOLEY CATH W/STRAP (MISCELLANEOUS) IMPLANT
INTERBODY SABLE 10X26 7-14 15D (Miscellaneous) IMPLANT
KIT PREVENA INCISION MGT 13 (CANNISTER) IMPLANT
KIT SPINAL PRONEVIEW (KITS) ×2 IMPLANT
LAVAGE JET IRRISEPT WOUND (IRRIGATION / IRRIGATOR) ×2 IMPLANT
MANIFOLD NEPTUNE II (INSTRUMENTS) ×2 IMPLANT
MARKER SKIN DUAL TIP RULER LAB (MISCELLANEOUS) ×2 IMPLANT
MARKER SPHERE PSV REFLC 13MM (MARKER) ×14 IMPLANT
NDL SAFETY ECLIP 18X1.5 (MISCELLANEOUS) ×2 IMPLANT
NS IRRIG 500ML POUR BTL (IV SOLUTION) IMPLANT
PACK LAMINECTOMY ARMC (PACKS) ×2 IMPLANT
PAD ARMBOARD POSITIONER FOAM (MISCELLANEOUS) ×2 IMPLANT
REDUCTION EXT RELINE MAS MOD (Neuro Prosthesis/Implant) IMPLANT
ROD RELINE MAS TI LORD 5.5X40 (Rod) IMPLANT
SCREW LOCK RELINE 5.5 TULIP (Screw) IMPLANT
SCREW MAS RED RELINE 6.5X55 (Screw) IMPLANT
SCREW RELINE RED 6.5X45MM POLY (Screw) IMPLANT
SCREW SHANK MAS MOD 6.5X50MM (Screw) IMPLANT
SOLN STERILE WATER 1000 ML (IV SOLUTION) IMPLANT
SOLN STERILE WATER BTL 1000 ML (IV SOLUTION) ×2 IMPLANT
STAPLER SKIN PROX 35W (STAPLE) IMPLANT
SURGIFLO W/THROMBIN 8M KIT (HEMOSTASIS) ×2 IMPLANT
SUT STRATA 3-0 15 PS-2 (SUTURE) ×2 IMPLANT
SUT VIC AB 0 CT1 27XCR 8 STRN (SUTURE) ×2 IMPLANT
SUT VIC AB 2-0 CT1 18 (SUTURE) ×2 IMPLANT
SYR 30ML LL (SYRINGE) ×4 IMPLANT
TIP CART INSTAFILL GL 5CC (ORTHOPEDIC DISPOSABLE SUPPLIES) IMPLANT
TOWEL OR 17X26 4PK STRL BLUE (TOWEL DISPOSABLE) ×2 IMPLANT
TRAP FLUID SMOKE EVACUATOR (MISCELLANEOUS) ×2 IMPLANT
TRAY FOLEY SLVR 16FR LF STAT (SET/KITS/TRAYS/PACK) IMPLANT

## 2024-05-23 NOTE — Interval H&P Note (Signed)
 History and Physical Interval Note:  05/23/2024 12:02 PM  Gabrielle Pineda  has presented today for surgery, with the diagnosis of Spondylolisthesis at L4-5 with mechanical back pain and radiculopathy/neurogenic claudication.  The various methods of treatment have been discussed with the patient and family. After consideration of risks, benefits and other options for treatment, the patient has consented to  Procedure(s): L4-5 MAS transforaminal lumbar interbody fusion with pedicle screw fixation, Facectectomy on rigth side, stereotactic navigation (N/A) APPLICATION OF INTRAOPERATIVE CT SCAN (N/A) as a surgical intervention.  The patient's history has been reviewed, patient examined, no change in status, stable for surgery.  I have reviewed the patient's chart and labs.  Questions were answered to the patient's satisfaction.    Heart and lungs clear   Penne LELON Sharps

## 2024-05-23 NOTE — Anesthesia Preprocedure Evaluation (Signed)
 Anesthesia Evaluation  Patient identified by MRN, date of birth, ID band Patient awake    Reviewed: Allergy & Precautions, NPO status , Patient's Chart, lab work & pertinent test results  History of Anesthesia Complications Negative for: history of anesthetic complications  Airway Mallampati: III  TM Distance: >3 FB Neck ROM: full    Dental  (+) Chipped, Poor Dentition, Missing   Pulmonary shortness of breath and with exertion, COPD,  COPD inhaler and oxygen  dependent, former smoker   + rhonchi  + decreased breath sounds      Cardiovascular Exercise Tolerance: Good hypertension, Normal cardiovascular exam     Neuro/Psych  Headaches  Neuromuscular disease    GI/Hepatic Neg liver ROS,GERD  Controlled,,  Endo/Other  negative endocrine ROS    Renal/GU Renal disease     Musculoskeletal   Abdominal   Peds  Hematology negative hematology ROS (+)   Anesthesia Other Findings Past Medical History: No date: Alcohol abuse No date: Allergy No date: Anemia No date: Anxiety No date: Arthritis No date: Cataract No date: Cervicalgia 1994: Cirrhosis (HCC) No date: COPD (chronic obstructive pulmonary disease) (HCC)     Comment:  wears O2 at night No date: Depression No date: Difficult intubation     Comment:  has plates and screws in neck No date: Dyspnea No date: GERD (gastroesophageal reflux disease) No date: Headache No date: Heart murmur     Comment:  on heard when pt is lying No date: Hypertension No date: Neuropathy No date: Other and unspecified hyperlipidemia No date: Oxygen  deficiency No date: Status post insertion of spinal cord stimulator No date: Tachycardia     Comment:  d/t questionable anxiety happens every 5- 10 years, sees              [facility]  Past Surgical History: 2012, 2015, 2018: ANTERIOR CERVICAL DECOMP/DISCECTOMY FUSION     Comment:  x3 1982: AUGMENTATION MAMMAPLASTY;  Bilateral 1981: BREAST ENHANCEMENT SURGERY 11/11/2011: CARPAL TUNNEL RELEASE     Comment:  Procedure: CARPAL TUNNEL RELEASE;  Surgeon: Catalina CHRISTELLA Stains, MD;  Location: [facility];  Service:               Neurosurgery;  Laterality: Right;  Right Median Nerve               Decompression 2013: CARPAL TUNNEL RELEASE 11/01/2017: COLONOSCOPY WITH PROPOFOL ; N/A     Comment:  Procedure: COLONOSCOPY WITH PROPOFOL ;  Surgeon: Viktoria Lamar DASEN, MD;  Location: [facility] ENDOSCOPY;  Service:               Endoscopy;  Laterality: N/A; 08/09/2023: COLONOSCOPY WITH PROPOFOL ; N/A     Comment:  Procedure: COLONOSCOPY WITH PROPOFOL ;  Surgeon: Onita Elspeth Sharper, DO;  Location: [facility] ENDOSCOPY;  Service:               Gastroenterology;  Laterality: N/A; No date: ESOPHAGOGASTRODUODENOSCOPY 08/09/2023: ESOPHAGOGASTRODUODENOSCOPY (EGD) WITH PROPOFOL ; N/A     Comment:  Procedure: ESOPHAGOGASTRODUODENOSCOPY (EGD) WITH               PROPOFOL ;  Surgeon: Onita Elspeth Sharper, DO;  Location:              [facility] ENDOSCOPY;  Service: Gastroenterology;  Laterality:  N/A; 2022: JOINT REPLACEMENT 08/15/2019: LUMBAR LAMINECTOMY/DECOMPRESSION MICRODISCECTOMY; N/A     Comment:  Procedure: Lumbar three to Sacral one Decompressive               lumbar laminectomy;  Surgeon: Unice Pac, MD;                Location: [facility] OR;  Service: Neurosurgery;  Laterality:               N/A; No date: neck disc surgery     Comment:  plates and screws in neck x 2 08/09/2023: POLYPECTOMY     Comment:  Procedure: POLYPECTOMY;  Surgeon: Onita Elspeth Sharper,              DO;  Location: [facility] ENDOSCOPY;  Service:               Gastroenterology;; 06/16/2016: ROTATOR CUFF REPAIR     Comment:  right shoulder  No date: ROTATOR CUFF REPAIR 01/04/2024: SPINAL CORD STIMULATOR BATTERY EXCHANGE; Right     Comment:  Procedure: SPINAL CORD STIMULATOR BATTERY EXCHANGE;                Surgeon:  Claudene Penne ORN, MD;  Location: [facility] ORS;                Service: Neurosurgery;  Laterality: Right;  Revision               Internal Pulse Generator for Spinal Cord Stimulator               (Non-rechargeable) 03/17/2021: SPINAL CORD STIMULATOR INSERTION; N/A     Comment:  Procedure: THORACIC SPINAL CORD STIMULATOR               (PERCUTANEOUS) & PULSE GENERATOR PLACEMENT;  Surgeon:               Bluford Elspeth, MD;  Location: [facility] ORS;  Service:               Neurosurgery;  Laterality: N/A; 2020: SPINE SURGERY 1973: TONSILLECTOMY AND ADENOIDECTOMY 04/15/2021: TOTAL HIP ARTHROPLASTY; Right     Comment:  Procedure: TOTAL HIP ARTHROPLASTY ANTERIOR APPROACH;                Surgeon: Kathlynn Sharper, MD;  Location: [facility] ORS;                Service: Orthopedics;  Laterality: Right;     Reproductive/Obstetrics negative OB ROS                              Anesthesia Physical Anesthesia Plan  ASA: 3  Anesthesia Plan: General ETT   Post-op Pain Management:    Induction: Intravenous  PONV Risk Score and Plan: Ondansetron , Dexamethasone , Midazolam  and Treatment may vary due to age or medical condition  Airway Management Planned: Oral ETT and Video Laryngoscope Planned  Additional Equipment:   Intra-op Plan:   Post-operative Plan: Extubation in OR  Informed Consent: I have reviewed the patients History and Physical, chart, labs and discussed the procedure including the risks, benefits and alternatives for the proposed anesthesia with the patient or authorized representative who has indicated his/her understanding and acceptance.     Dental Advisory Given  Plan Discussed with: Anesthesiologist, CRNA and Surgeon  Anesthesia Plan Comments: (Patient consented for risks of anesthesia including but not limited to:  - adverse reactions to medications - damage to eyes, teeth, lips or  other oral mucosa - nerve damage due to positioning  - sore throat or  hoarseness - Damage to heart, brain, nerves, lungs, other parts of body or loss of life  Patient voiced understanding and assent.)        Anesthesia Quick Evaluation

## 2024-05-23 NOTE — Progress Notes (Signed)
  Subjective:  Patient ID: PATRA GHERARDI, female    DOB: 1956-01-02,  MRN: [id number]  Briggitte Z Okubo presents to clinic today for at risk foot care with history of peripheral neuropathy and corn(s) left foot and painful mycotic toenails that are difficult to trim. Painful toenails interfere with ambulation. Aggravating factors include wearing enclosed shoe gear. Pain is relieved with periodic professional debridement. Painful corns are aggravated when weightbearing when wearing enclosed shoe gear. Pain is relieved with periodic professional debridement.  Chief Complaint  Patient presents with   Toe Pain    RFC. Dr. Donzella is her PCP. Last visit was in Sept 2025.    New problem(s): None.   PCP is [name], DO.  Allergies  Allergen Reactions   Sulfasalazine Other (See Comments)    Does not remember reaction    Plaquenil [Hydroxychloroquine] Rash    Review of Systems: Negative except as noted in the HPI.  Objective: No changes noted in today's physical examination. There were no vitals filed for this visit. Natsumi Z Sheard is a pleasant 68 y.o. female WD, WN in NAD. AAO x 3.  Vascular Examination: Capillary refill time immediate b/l. Palpable pedal pulses. No pain with calf compression b/l. Skin temperature gradient WNL b/l. No cyanosis or clubbing b/l. No ischemia or gangrene noted b/l. Pedal hair absent.  Neurological Examination: Sensation grossly intact b/l with 10 gram monofilament. Vibratory sensation intact b/l. Pt has subjective symptoms of neuropathy.  Dermatological Examination: Pedal skin with normal turgor, texture and tone b/l.  No open wounds. No interdigital macerations.   Toenails 1-5 b/l thick, discolored, elongated with subungual debris and pain on dorsal palpation.   Resolved hyperkeratotic lesion(s) dorsal PIPJ of L 4th toe.  No erythema, no edema, no drainage, no fluctuance.  Musculoskeletal Examination: Muscle strength 5/5 to all lower  extremity muscle groups bilaterally. HAV with bunion deformity noted b/l LE. Severe hammertoe deformity noted 2-5 bilaterally.  Radiographs: None  Assessment/Plan: 1. Pain due to onychomycosis of toenails of both feet   2. Neuropathy   Consent given for treatment. Patient examined. All patient's and/or POA's questions/concerns addressed on today's visit. Toenails 1-5 debrided in length and girth without incident. Continue soft, supportive shoe gear daily. Report any pedal injuries to medical professional. Call office if there are any questions/concerns.  No follow-ups on file.  Delon LITTIE Merlin, DPM      [place] LOCATION: [address] [place] [address]                   Office [phone]   [facility] LOCATION: [address] [place], [name] Office [phone]

## 2024-05-23 NOTE — Discharge Instructions (Signed)
 Your surgeon has performed an operation on your lumbar spine (low back) to relieve pressure on one or more nerves. Many times, patients feel better immediately after surgery and can "overdo it." Even if you feel well, it is important that you follow these activity guidelines. If you do not let your back heal properly from the surgery, you can increase the chance of hardware complications and/or return of your symptoms. The following are instructions to help in your recovery once you have been discharged from the hospital.  Do not use NSAIDs for 3 months after surgery.  *Regarding compression stockings-  Please wear day and night until you are walking a couple hundred feet three times a day.   Activity    No bending, lifting, or twisting ("BLT"). Avoid lifting objects heavier than 10 pounds (gallon milk jug).  Where possible, avoid household activities that involve lifting, bending, pushing, or pulling such as laundry, vacuuming, grocery shopping, and childcare. Try to arrange for help from friends and family for these activities while your back heals.  Increase physical activity slowly as tolerated.  Taking short walks is encouraged, but avoid strenuous exercise. Do not jog, run, bicycle, lift weights, or participate in any other exercises unless specifically allowed by your doctor. Avoid prolonged sitting, including car rides.  Talk to your doctor before resuming sexual activity.  You should not drive until cleared by your doctor.  Until released by your doctor, you should not return to work or school.  You should rest at home and let your body heal.   You may shower three days after your surgery.  After showering, lightly dab your incision dry. Do not take a tub bath or go swimming for 3 weeks, or until approved by your doctor at your follow-up appointment.  If you smoke, we strongly recommend that you quit.  Smoking has been proven to interfere with normal healing in your back and will  dramatically reduce the success rate of your surgery. Please contact [facility] (800-QUIT-NOW) and use the resources at www.[facility].com for assistance in stopping smoking.  Surgical Incision   If you have a dressing on your incision, you may remove it three days after your surgery. Keep your incision area clean and dry.  Your incision was closed with Dermabond glue. The glue should begin to peel away within about a week.  Diet            You may return to your usual diet. Be sure to stay hydrated.  When to Contact Us   Although your surgery and recovery will likely be uneventful, you may have some residual numbness, aches, and pains in your back and/or legs. This is normal and should improve in the next few weeks.  However, should you experience any of the following, contact us  immediately: New numbness or weakness Pain that is progressively getting worse, and is not relieved by your pain medications or rest Bleeding, redness, swelling, pain, or drainage from surgical incision Chills or flu-like symptoms Fever greater than 101.0 F (38.3 C) Problems with bowel or bladder functions Difficulty breathing or shortness of breath Warmth, tenderness, or swelling in your calf  Contact Information How to contact us :  If you have any questions/concerns before or after surgery, you can reach us  at [phone], or you can send a mychart message. We can be reached by phone or mychart 8am-4pm, Monday-Friday.  *Please note: Calls after 4pm are forwarded to a third party answering service. Mychart messages are not routinely monitored during evenings,  weekends, and holidays. Please call our office to contact the answering service for urgent concerns during non-business hours.

## 2024-05-23 NOTE — Progress Notes (Signed)
 Dr. Claudene aware of hematoma lumbar area right side dressing. Ice applied Area marked. Per provider monitor and have patient flat.  Patient verbalizes understanding.

## 2024-05-23 NOTE — Transfer of Care (Signed)
 Immediate Anesthesia Transfer of Care Note  Patient: Gabrielle Pineda  Procedure(s) Performed: L4-5 MAS transforaminal lumbar interbody fusion with pedicle screw fixation, Facectectomy on rigth side, stereotactic navigation (Spine Lumbar) APPLICATION OF INTRAOPERATIVE CT SCAN (Back)  Patient Location: PACU  Anesthesia Type:General  Level of Consciousness: drowsy and patient cooperative  Airway & Oxygen  Therapy: Patient Spontanous Breathing and Patient connected to face mask oxygen   Post-op Assessment: Report given to RN and Post -op Vital signs reviewed and stable  Post vital signs: stable  Last Vitals:  Vitals Value Taken Time  BP 159/105 05/23/24 16:19  Temp    Pulse 81 05/23/24 16:24  Resp 12 05/23/24 16:24  SpO2 100 % 05/23/24 16:24  Vitals shown include unfiled device data.  Last Pain:  Vitals:   05/23/24 1024  TempSrc: Oral         Complications: No notable events documented.

## 2024-05-23 NOTE — Anesthesia Procedure Notes (Signed)
 Procedure Name: Intubation Date/Time: 05/23/2024 1:05 PM  Performed by: Norleen Alberta HERO., CRNAPre-anesthesia Checklist: Patient identified, Patient being monitored, Timeout performed, Emergency Drugs available and Suction available Patient Re-evaluated:Patient Re-evaluated prior to induction Oxygen  Delivery Method: Circle system utilized Preoxygenation: Pre-oxygenation with 100% oxygen  Induction Type: IV induction Ventilation: Mask ventilation without difficulty Laryngoscope Size: McGrath and 4 Grade View: Grade I Tube type: Oral Tube size: 7.0 mm Number of attempts: 1 Airway Equipment and Method: Stylet Placement Confirmation: ETT inserted through vocal cords under direct vision, positive ETCO2 and breath sounds checked- equal and bilateral Secured at: 19 cm Tube secured with: Tape Dental Injury: Teeth and Oropharynx as per pre-operative assessment  Comments: Rigid neck, length of 4 blade helped easily visualize

## 2024-05-23 NOTE — Op Note (Signed)
 Indications: [name] is suffering from severe lumbar radiculopathy secondary to spondylolisthesis and severe foraminal stenosis. The patient tried and failed conservative management, prompting surgical intervention.  Findings: successful TLIF procedure  Preoperative Diagnosis: Spondylolisthesis at L4-5 with mechanical back pain and radiculopathy/neurogenic claudication Postoperative Diagnosis: Spondylolisthesis at L4-5 with mechanical back pain and radiculopathy/neurogenic claudication  EBL: 190 mL IVF: See anesthesia record Drains: None Disposition: Extubated and Stable to PACU Complications: none  A foley catheter was placed.   Preoperative Note:   Risks of surgery discussed include: infection, bleeding, stroke, coma, death, paralysis, CSF leak, nerve/spinal cord injury, numbness, tingling, weakness, complex regional pain syndrome, recurrent stenosis and/or disc herniation, vascular injury, development of instability, neck/back pain, need for further surgery, persistent symptoms, development of deformity, and the risks of anesthesia. The patient understood these risks and agreed to proceed.  Operative Note:  1. Transforaminal Lumbar Interbody Fusion L4/5 2. Posterolateral arthrodesis L4 to L5 3. Posterior nonsegmental instrumentation L4 to L5 using Nuvasive Reline 4. Use of stereotaxis ([employer]) 5. Harvesting of autograft via the same incision 6. Placement of a biomechanical device (Globus Sable) at L4/5 for anterior arthrodesis  The patient was brought to the Operating Room, intubated and turned into the prone position. All pressure points were checked and double checked. The patient was prepped and draped in the standard fashion. A full timeout was performed. Preoperative antibiotics were given.   After draping, the stereotactic array was placed.  Stereotactic imaging was acquired and registered to the [place] system.  The image guidance system was used to choose  bilateral [place] incisions. The incisions were injected with local anesthetic.  Each incision was opened with a scalpel, we used sharp dissection deep as well given the presence of a spinal cord stimulator using stereotactic guidance, each pedicle from L4-L5was cannulated.  Utilizing the guide wireless technique we then performed an undersized tap, and placed the NuVasive reline screws on the contralateral side, on the ipsilateral side we placed the NuVasive screws in similar fashion with the integrated MASTLIF retractor system.  After placement of pedicle screws, we then turned our attention to transforaminal lumbar interbody fusion.  The retractor system was placed and helped us  to expose the posterior lateral elements of the L4-L5 levels.  The facet was removed with osteotomies and handed off for preparation as autograft.  The traversing and exiting nerve roots on the right were identified and protected. The disc was opened using a scalpel. After incising the disc space, we took a combination of pituitary rongeurs, Kerrison rongeurs, disc scrapers, and curettes to remove a majority of the disc material.  We prepared the end plates for accepting the interbody fusion.  We removed the cartilaginous plate, preserved the cortical endplate if possible during this procedure.  The disc space was irrigated.  The space was prefilled with allograft and autograft.  The Globus Sable biomechanical device was inserted, then backfilled with a mixture of allograft and autograft. During placement, the nerve roots and dural sac were carefully protected without any leaks identified. After placement of the device, the canal was fully inspected and all nerve roots were checked to ensure full decompression.  The retractor was removed.  Rods measured and placed. The rods were secured using locking caps to manufacturer's specifications. CT scan was taken to confirm instrumentation placement. The wound was copiously irrigated,  then the posterior elements were prepared for arthrodesis.  A mixture of allograft and autograft was placed over the decorticated surfaces for arthrodesis.   After  hemostasis sterile/antiseptic irrigation, the wound was closed in layers with 0 and 2-0 vicryl. 3-0 monocryl and dermabond was applied to the incision. A sterile dressing was placed.  The patient was then flipped supine and extubated with incident. All counts were correct times 2 at the end of the case. No immediate complications were noted.  Edsel Goods PA assisted in the entire procedure. An assistant was required for this procedure due to the complexity.  The assistant provided assistance in tissue manipulation and suction, and was required for the successful and safe performance of the procedure. I performed the critical portions of the procedure.  Implant Name Type Inv. Item Serial No. Manufacturer Lot No. LRB No. Used Action  ALLOGRAFT BONE FIBER KORE 10CC - [id number] Bone Implant ALLOGRAFT BONE FIBER KORE 10CC [id number] MUSCULOSKELETL TRANSPLANT FNDN NA N/A 1 Implanted  SCREW SHANK MAS MOD 6.5X50MM - SNA Screw SCREW SHANK MAS MOD 6.5X50MM NA [employer] [id number] N/A 2 Implanted  SCREW RELINE RED 6.5X45MM POLY - [id number] Screw SCREW RELINE RED 6.5X45MM POLY  [employer]  N/A 1 Implanted  SCREW MAS RED RELINE 6.5X55 - [id number] Screw SCREW MAS RED RELINE 6.5X55  [employer]  N/A 1 Implanted  REDUCTION EXT RELINE MAS MOD - [id number] Neuro Prosthesis/Implant REDUCTION EXT RELINE MAS MOD  [employer]  N/A 2 Implanted  SCREW LOCK RELINE 5.5 [place] - [id number] Screw SCREW LOCK RELINE 5.5 TULIP  [employer]  N/A 4 Implanted  INTERBODY SABLE 10X26 7-14 15D - SNA Miscellaneous INTERBODY SABLE 10X26 7-14 15D NA [employer] [id number] N/A 1 Implanted  ROD RELINE MAS TI LORD 5.5X40 - [id number] Rod ROD RELINE MAS TI LORD 5.5X40  [employer]  N/A 2 Implanted     [name] Sharps, MD

## 2024-05-24 ENCOUNTER — Other Ambulatory Visit: Payer: Self-pay

## 2024-05-24 ENCOUNTER — Encounter: Payer: Self-pay | Admitting: Neurosurgery

## 2024-05-24 DIAGNOSIS — Z87891 Personal history of nicotine dependence: Secondary | ICD-10-CM | POA: Diagnosis not present

## 2024-05-24 DIAGNOSIS — Z96641 Presence of right artificial hip joint: Secondary | ICD-10-CM | POA: Diagnosis not present

## 2024-05-24 DIAGNOSIS — M48062 Spinal stenosis, lumbar region with neurogenic claudication: Secondary | ICD-10-CM | POA: Diagnosis not present

## 2024-05-24 DIAGNOSIS — J449 Chronic obstructive pulmonary disease, unspecified: Secondary | ICD-10-CM | POA: Diagnosis not present

## 2024-05-24 DIAGNOSIS — M4316 Spondylolisthesis, lumbar region: Secondary | ICD-10-CM | POA: Diagnosis not present

## 2024-05-24 DIAGNOSIS — Z780 Asymptomatic menopausal state: Secondary | ICD-10-CM | POA: Diagnosis not present

## 2024-05-24 DIAGNOSIS — Z79899 Other long term (current) drug therapy: Secondary | ICD-10-CM | POA: Diagnosis not present

## 2024-05-24 DIAGNOSIS — M5416 Radiculopathy, lumbar region: Secondary | ICD-10-CM | POA: Diagnosis not present

## 2024-05-24 DIAGNOSIS — I10 Essential (primary) hypertension: Secondary | ICD-10-CM | POA: Diagnosis not present

## 2024-05-24 MED ORDER — EZETIMIBE 10 MG PO TABS
ORAL_TABLET | ORAL | Status: AC
  Filled 2024-05-24: qty 1

## 2024-05-24 MED ORDER — OXYCODONE HCL 5 MG PO TABS
10.0000 mg | ORAL_TABLET | ORAL | Status: DC | PRN
Start: 2024-05-24 — End: 2024-05-25
  Administered 2024-05-24 – 2024-05-25 (×6): 10 mg via ORAL
  Filled 2024-05-24 (×6): qty 2

## 2024-05-24 MED ORDER — OXYCODONE HCL 5 MG PO TABS
5.0000 mg | ORAL_TABLET | ORAL | Status: DC | PRN
Start: 2024-05-24 — End: 2024-05-25

## 2024-05-24 MED ORDER — HYDROMORPHONE HCL 2 MG PO TABS
2.0000 mg | ORAL_TABLET | ORAL | Status: DC | PRN
Start: 2024-05-24 — End: 2024-05-24

## 2024-05-24 MED ORDER — GABAPENTIN 300 MG PO CAPS
ORAL_CAPSULE | ORAL | Status: AC
  Filled 2024-05-24: qty 1

## 2024-05-24 MED ORDER — GABAPENTIN 100 MG PO CAPS
ORAL_CAPSULE | ORAL | Status: AC
  Filled 2024-05-24: qty 1

## 2024-05-24 MED ORDER — HYDROMORPHONE HCL 2 MG PO TABS
4.0000 mg | ORAL_TABLET | ORAL | Status: DC | PRN
  Administered 2024-05-24 (×2): 4 mg via ORAL
  Filled 2024-05-24 (×2): qty 2

## 2024-05-24 NOTE — Evaluation (Signed)
 Physical Therapy Evaluation Patient Details Name: KYLAN VEACH MRN: [id number] DOB: 1955/09/26 Today's Date: 05/24/2024  History of Present Illness  Nayeli Z Rocque  has presented 10/07 for surgery, with the diagnosis of Spondylolisthesis at L4-5 with mechanical back pain and radiculopathy/neurogenic claudication.  Clinical Impression  Patient seen for initial PT evaluation due to decline in functional status sp po TLIF procedure. Patient is A&O x 4. Baseline mobility reported as modI with occasional use of RW , currently requiring supervision for bed mobility, transfers, stand and walking. Pt able to ambulate 200 feet with RW with supervision. Pt additionally demonstrated good safety awareness during stair training. Pt limited by pain which was monitored throughout session. Pt was educated and demonstrated understanding of precautions. Pt lives with significant other with 3 steps to get into home. Pt has assistance available 24/7. Recommend skilled PT to address safety, mobility, and discharge planning. PT recommend d/c to HHPT.          If plan is discharge home, recommend the following: A little help with walking and/or transfers;A little help with bathing/dressing/bathroom   Can travel by private vehicle        Equipment Recommendations    Recommendations for Other Services       Functional Status Assessment Patient has had a recent decline in their functional status and demonstrates the ability to make significant improvements in function in a reasonable and predictable amount of time.     Precautions / Restrictions Precautions Precautions: Other (comment) (spinal precautions) Recall of Precautions/Restrictions: Intact Restrictions Weight Bearing Restrictions Per Provider Order: Yes      Mobility  Bed Mobility Overal bed mobility: Needs Assistance Bed Mobility: Rolling Rolling: Supervision         General bed mobility comments: vc to maintain spinal  precautions    Transfers Overall transfer level: Needs assistance Equipment used: Rolling walker (2 wheels) Transfers: Sit to/from Stand Sit to Stand: Supervision           General transfer comment: vc to maintain spinal precautions    Ambulation/Gait Ambulation/Gait assistance: Supervision Gait Distance (Feet): 200 Feet Assistive device: Rolling walker (2 wheels) Gait Pattern/deviations: Step-through pattern, Decreased stride length       General Gait Details: decreased gait speed overall  Stairs            Wheelchair Mobility     Tilt Bed    Modified Rankin (Stroke Patients Only)       Balance Overall balance assessment: Needs assistance Sitting-balance support: No upper extremity supported Sitting balance-Leahy Scale: Good       Standing balance-Leahy Scale: Fair Standing balance comment: with RW                             Pertinent Vitals/Pain Pain Assessment Pain Assessment: 0-10 Pain Score: 7  Pain Location: lower back pain Pain Descriptors / Indicators: Throbbing Pain Intervention(s): Limited activity within patient's tolerance, Monitored during session, Premedicated before session, Repositioned    Home Living Family/patient expects to be discharged to:: Private residence Living Arrangements: Spouse/significant other Available Help at Discharge: Family;Available PRN/intermittently Type of Home: House Home Access: Stairs to enter Entrance Stairs-Rails: None Entrance Stairs-Number of Steps: 7   Home Layout: One level Home Equipment: Agricultural consultant (2 wheels);Cane - single point;Shower seat - built in;BSC/3in1 Additional Comments: 6-7 steps with rail through front door    Prior Function Prior Level of Function : Independent/Modified Independent  Mobility Comments: used RW at baseline occassionally and somtimes a cane       Extremity/Trunk Assessment        Lower Extremity Assessment Lower Extremity  Assessment: Generalized weakness    Cervical / Trunk Assessment Cervical / Trunk Assessment: Normal  Communication        Cognition                                         Cueing       General Comments      Exercises     Assessment/Plan    PT Assessment Patient needs continued PT services  PT Problem List Decreased strength;Decreased range of motion;Decreased activity tolerance;Decreased balance;Decreased mobility       PT Treatment Interventions DME instruction;Gait training;Stair training;Functional mobility training;Therapeutic activities;Therapeutic exercise;Balance training;Neuromuscular re-education;Patient/family education    PT Goals (Current goals can be found in the Care Plan section)  Acute Rehab PT Goals Patient Stated Goal: Pt wants to go home PT Goal Formulation: With patient Time For Goal Achievement: 06/07/24 Potential to Achieve Goals: Good    Frequency Min 4X/week     Co-evaluation               AM-PAC PT 6 Clicks Mobility  Outcome Measure Help needed turning from your back to your side while in a flat bed without using bedrails?: None Help needed moving from lying on your back to sitting on the side of a flat bed without using bedrails?: None Help needed moving to and from a bed to a chair (including a wheelchair)?: None Help needed standing up from a chair using your arms (e.g., wheelchair or bedside chair)?: None Help needed to walk in hospital room?: None Help needed climbing 3-5 steps with a railing? : A Little 6 Click Score: 23    End of Session Equipment Utilized During Treatment: Gait belt Activity Tolerance: Patient tolerated treatment well Patient left: in chair;with call bell/phone within reach Nurse Communication: Mobility status;Patient requests pain meds PT Visit Diagnosis: Muscle weakness (generalized) (M62.81);Pain;Other abnormalities of gait and mobility (R26.89) Pain - Right/Left:  (lower  back) Pain - part of body:  (spine)    Time: 9193-9163 PT Time Calculation (min) (ACUTE ONLY): 30 min   Charges:   PT Evaluation $PT Eval Low Complexity: 1 Low   PT General Charges $$ ACUTE PT VISIT: 1 Visit         Sherlean Lesches DPT, PT    Rylen Swindler A Tameya Kuznia 05/24/2024, 8:49 AM

## 2024-05-24 NOTE — Progress Notes (Cosign Needed Addendum)
   Neurosurgery Progress Note  History: Gabrielle Pineda is s/p L4-5 MAS TLIF  POD1: Pt experienced right sided hematoma overnight treated with ice and keeping her flat. Today she states she feels better but is having some trouble with pain control.   Physical Exam: Vitals:   05/23/24 2252 05/24/24 0400  BP: (!) 149/83 126/74  Pulse: 80 72  Resp: 16 17  Temp: 97.9 F (36.6 C) (!) 97.3 F (36.3 C)  SpO2: 97% 94%    AA Ox3 CNI  Strength:                                                                                                                          Side Iliopsoas Quads Hamstring PF DF EHL  R 5 5 5 5  4- 4  L 5 5 5 5 5 5    Incisions: c/d/I. There is a small hematoma under right incision that appears to be resolving   Data:  Other tests/results: NA  Assessment/Plan:  Gabrielle Pineda is a 68 y.o presenting with spondylolisthesis and neurogenic claudiation s/p L4-5 MAS TLIF  - mobilize - pain control; changed to dilaudid   - DVT prophylaxis - PTOT  Edsel Goods PA-C Department of Neurosurgery

## 2024-05-24 NOTE — Care Management Obs Status (Signed)
 MEDICARE OBSERVATION STATUS NOTIFICATION   Patient Details  Name: LURENA NAEVE MRN: [id number] Date of Birth: [birth year]   Medicare Observation Status Notification Given:  Yes    Rojelio SHAUNNA Rattler 05/24/2024, 10:35 AM

## 2024-05-24 NOTE — Plan of Care (Signed)
  Problem: Education: Goal: Knowledge of General Education information will improve Description: Including pain rating scale, medication(s)/side effects and non-pharmacologic comfort measures Outcome: Progressing   Problem: Clinical Measurements: Goal: Ability to maintain clinical measurements within normal limits will improve Outcome: Progressing   Problem: Activity: Goal: Risk for activity intolerance will decrease Outcome: Progressing   Problem: Nutrition: Goal: Adequate nutrition will be maintained Outcome: Progressing   Problem: Elimination: Goal: Will not experience complications related to bowel motility Outcome: Progressing   Problem: Pain Managment: Goal: General experience of comfort will improve and/or be controlled Outcome: Progressing

## 2024-05-24 NOTE — Anesthesia Postprocedure Evaluation (Signed)
 Anesthesia Post Note  Patient: Gabrielle Pineda  Procedure(s) Performed: L4-5 MAS transforaminal lumbar interbody fusion with pedicle screw fixation, Facectectomy on rigth side, stereotactic navigation (Spine Lumbar) APPLICATION OF INTRAOPERATIVE CT SCAN (Back)  Patient location during evaluation: PACU Anesthesia Type: General Level of consciousness: awake and alert Pain management: pain level controlled Vital Signs Assessment: post-procedure vital signs reviewed and stable Respiratory status: spontaneous breathing, nonlabored ventilation, respiratory function stable and patient connected to nasal cannula oxygen  Cardiovascular status: blood pressure returned to baseline and stable Postop Assessment: no apparent nausea or vomiting Anesthetic complications: no   There were no known notable events for this encounter.   Last Vitals:  Vitals:   05/23/24 2252 05/24/24 0400  BP: (!) 149/83 126/74  Pulse: 80 72  Resp: 16 17  Temp: 36.6 C (!) 36.3 C  SpO2: 97% 94%    Last Pain:  Vitals:   05/24/24 0400  TempSrc: Temporal  PainSc:                  [name]

## 2024-05-24 NOTE — Plan of Care (Signed)
   Problem: Education: Goal: Knowledge of General Education information will improve Description Including pain rating scale, medication(s)/side effects and non-pharmacologic comfort measures Outcome: Progressing

## 2024-05-24 NOTE — Evaluation (Signed)
 Occupational Therapy Evaluation Patient Details Name: Gabrielle Pineda MRN: [id number] DOB: February 28, 1956 Today's Date: 05/24/2024   History of Present Illness   Pt is a 68 year old female s/p L4-5 MAS TLIF on 05/23/24;     Clinical Impressions Pt seen for OT evaluation this date, alert and oriented x4. PTA pt is grossly MOD I-I in ADL/IADL, amb with PRN use of RW or SPC. Pt educated in precautions, self care skills, AE, and home/routines modifications to maximize safety and functional independence while minimizing falls risk and maintaining precautions. Pt verbalized understanding of all education/training provided. Able to return demonstration safe techniques while maintaining precautions throughout. Please see further details below. OT will follow acutely to facilitate optimal ADL/functional mobility performance.      If plan is discharge home, recommend the following:   A little help with walking and/or transfers;A little help with bathing/dressing/bathroom;Assist for transportation;Assistance with cooking/housework     Functional Status Assessment   Patient has had a recent decline in their functional status and demonstrates the ability to make significant improvements in function in a reasonable and predictable amount of time.     Equipment Recommendations   Other (comment) (pt has recommended equipment)     Recommendations for Other Services         Precautions/Restrictions   Precautions Precautions: Back Precaution Booklet Issued:  (by PT) Recall of Precautions/Restrictions: Intact Restrictions Weight Bearing Restrictions Per Provider Order: No     Mobility Bed Mobility               General bed mobility comments: NT in recliner pre/post session    Transfers Overall transfer level: Needs assistance Equipment used: Rolling walker (2 wheels) Transfers: Sit to/from Stand Sit to Stand: Contact guard assist                  Balance Overall  balance assessment: Needs assistance Sitting-balance support: No upper extremity supported Sitting balance-Leahy Scale: Good     Standing balance support: Bilateral upper extremity supported, Reliant on assistive device for balance, During functional activity Standing balance-Leahy Scale: Fair                             ADL either performed or assessed with clinical judgement   ADL Overall ADL's : Needs assistance/impaired Eating/Feeding: Set up   Grooming: Set up;Sitting           Upper Body Dressing : Sitting;Minimal assistance Upper Body Dressing Details (indicate cue type and reason): doff gown/robe; donn shirt Lower Body Dressing: Moderate assistance;Sitting/lateral leans Lower Body Dressing Details (indicate cue type and reason): donn underwear and pants via figure 4 technique; educated pt re: use of Risk analyst: Contact guard assist;Rolling walker (2 wheels) Toilet Transfer Details (indicate cue type and reason): 3 in 1 over toilet Toileting- Clothing Manipulation and Hygiene: Contact guard assist;Sitting/lateral lean Toileting - Clothing Manipulation Details (indicate cue type and reason): edu re: precautions in regards to wiping after BM, compensatory techniques     Functional mobility during ADLs: Contact guard assist;Rolling walker (2 wheels) (approx 50')       Vision Patient Visual Report: No change from baseline       Perception         Praxis         Pertinent Vitals/Pain Pain Assessment Pain Assessment: 0-10 Pain Score: 9  Pain Location: back pain after mobility Pain Descriptors / Indicators: Grimacing, Discomfort Pain Intervention(s):  Monitored during session, Repositioned (notified RN)     Extremity/Trunk Assessment Upper Extremity Assessment Upper Extremity Assessment: Overall WFL for tasks assessed   Lower Extremity Assessment Lower Extremity Assessment: Defer to PT evaluation   Cervical / Trunk  Assessment Cervical / Trunk Assessment: Back Surgery   Communication Communication Communication: No apparent difficulties   Cognition Arousal: Alert Behavior During Therapy: WFL for tasks assessed/performed Cognition: No apparent impairments                               Following commands: Intact       Cueing  General Comments   Cueing Techniques: Verbal cues  pt appears SOB, spo2 between 90-93% on RA pre/during/post mobility, nurse notified; incision appears the same pre/post session   Exercises     Shoulder Instructions      Home Living Family/patient expects to be discharged to:: Private residence Living Arrangements: Spouse/significant other Available Help at Discharge: Family;Available PRN/intermittently Type of Home: House Home Access: Stairs to enter [employer] of Steps: 7 Entrance Stairs-Rails: None Home Layout: One level     Bathroom Shower/Tub: Tub/shower unit;Walk-in shower   Bathroom Toilet: Standard Bathroom Accessibility: Yes   Home Equipment: Agricultural consultant (2 wheels);Cane - single point;Shower seat - built in;BSC/3in1;Adaptive equipment Adaptive Equipment: Reacher Additional Comments: 6-7 steps with rail through front door      Prior Functioning/Environment Prior Level of Function : Independent/Modified Independent             Mobility Comments: PRN use of RW or cane ADLs Comments: MOD I-I in ADL/IADL    OT Problem List: Decreased strength;Decreased coordination;Decreased activity tolerance;Impaired balance (sitting and/or standing);Decreased knowledge of use of DME or AE   OT Treatment/Interventions: Self-care/ADL training;Balance training;Therapeutic exercise;Therapeutic activities;DME and/or AE instruction;Patient/family education      OT Goals(Current goals can be found in the care plan section)   Acute Rehab OT Goals Patient Stated Goal: go home OT Goal Formulation: With patient Time For Goal  Achievement: 06/07/24 Potential to Achieve Goals: Good ADL Goals Pt Will Perform Grooming: with modified independence;sitting;standing Pt Will Perform Lower Body Dressing: with modified independence;sitting/lateral leans;sit to/from stand Pt Will Transfer to Toilet: with modified independence;ambulating Pt Will Perform Toileting - Clothing Manipulation and hygiene: with modified independence;sitting/lateral leans;sit to/from stand   OT Frequency:  Min 3X/week    Co-evaluation              AM-PAC OT 6 Clicks Daily Activity     Outcome Measure Help from another person eating meals?: None Help from another person taking care of personal grooming?: None Help from another person toileting, which includes using toliet, bedpan, or urinal?: A Little Help from another person bathing (including washing, rinsing, drying)?: A Little Help from another person to put on and taking off regular upper body clothing?: None Help from another person to put on and taking off regular lower body clothing?: A Lot 6 Click Score: 20   End of Session Equipment Utilized During Treatment: Rolling walker (2 wheels) Nurse Communication: Mobility status;Patient requests pain meds  Activity Tolerance: Patient tolerated treatment well Patient left: in chair;with call bell/phone within reach  OT Visit Diagnosis: Other abnormalities of gait and mobility (R26.89)                Time: 9094-9078 OT Time Calculation (min): 16 min Charges:  OT General Charges $OT Visit: 1 Visit OT Evaluation $OT Eval Moderate Complexity: 1  Mod Therisa Sheffield, OTD OTR/L  05/24/24, 9:59 AM

## 2024-05-25 ENCOUNTER — Other Ambulatory Visit: Payer: Self-pay

## 2024-05-25 DIAGNOSIS — M4316 Spondylolisthesis, lumbar region: Secondary | ICD-10-CM | POA: Diagnosis not present

## 2024-05-25 DIAGNOSIS — J449 Chronic obstructive pulmonary disease, unspecified: Secondary | ICD-10-CM | POA: Diagnosis not present

## 2024-05-25 DIAGNOSIS — M5416 Radiculopathy, lumbar region: Secondary | ICD-10-CM | POA: Diagnosis not present

## 2024-05-25 DIAGNOSIS — Z96641 Presence of right artificial hip joint: Secondary | ICD-10-CM | POA: Diagnosis not present

## 2024-05-25 DIAGNOSIS — Z780 Asymptomatic menopausal state: Secondary | ICD-10-CM | POA: Diagnosis not present

## 2024-05-25 DIAGNOSIS — Z79899 Other long term (current) drug therapy: Secondary | ICD-10-CM | POA: Diagnosis not present

## 2024-05-25 DIAGNOSIS — Z87891 Personal history of nicotine dependence: Secondary | ICD-10-CM | POA: Diagnosis not present

## 2024-05-25 DIAGNOSIS — M48062 Spinal stenosis, lumbar region with neurogenic claudication: Secondary | ICD-10-CM | POA: Diagnosis not present

## 2024-05-25 DIAGNOSIS — I10 Essential (primary) hypertension: Secondary | ICD-10-CM | POA: Diagnosis not present

## 2024-05-25 MED ORDER — POLYETHYLENE GLYCOL 3350 17 GM/SCOOP PO POWD
17.0000 g | Freq: Every day | ORAL | 0 refills | Status: AC | PRN
  Filled 2024-05-25: qty 238, 14d supply, fill #0

## 2024-05-25 MED ORDER — ACETAMINOPHEN 500 MG PO TABS: 1000.0000 mg | ORAL_TABLET | Freq: Four times a day (QID) | ORAL | Status: AC

## 2024-05-25 MED ORDER — SENNA 8.6 MG PO TABS
1.0000 | ORAL_TABLET | Freq: Two times a day (BID) | ORAL | 0 refills | Status: AC
  Filled 2024-05-25: qty 30, 15d supply, fill #0

## 2024-05-25 MED ORDER — TIZANIDINE HCL 4 MG PO TABS
4.0000 mg | ORAL_TABLET | Freq: Four times a day (QID) | ORAL | 0 refills | Status: DC | PRN
  Filled 2024-05-25: qty 30, 8d supply, fill #0

## 2024-05-25 MED ORDER — EZETIMIBE 10 MG PO TABS
ORAL_TABLET | ORAL | Status: AC
  Filled 2024-05-25: qty 1

## 2024-05-25 MED ORDER — OXYCODONE HCL 5 MG PO TABS
5.0000 mg | ORAL_TABLET | ORAL | 0 refills | Status: DC | PRN
  Filled 2024-05-25: qty 60, 5d supply, fill #0

## 2024-05-25 NOTE — Progress Notes (Signed)
 Occupational Therapy Treatment Patient Details Name: Gabrielle Pineda MRN: [id number] DOB: 1955/11/23 Today's Date: 05/25/2024   History of present illness Gabrielle Pineda  has presented 10/07 for surgery, with the diagnosis of Spondylolisthesis at L4-5 with mechanical back pain and radiculopathy/neurogenic claudication.   OT comments  Chart reviewed to date, pt greeted semi supine in bed, agreeable to OT tx session targeting improving functional activity tolerance in prep for ADL tasks. Supervision required for all mobility/ADL tasks at this time. Pt continues to make progress towards goals, OT will continue to follow.       If plan is discharge home, recommend the following:  A little help with walking and/or transfers;A little help with bathing/dressing/bathroom;Assist for transportation;Assistance with cooking/housework   Equipment Recommendations  Other (comment) (pt has recommended equipment)    Recommendations for Other Services      Precautions / Restrictions Precautions Precautions: Back Recall of Precautions/Restrictions: Intact Restrictions Weight Bearing Restrictions Per Provider Order: No       Mobility Bed Mobility Overal bed mobility: Needs Assistance Bed Mobility: Rolling, Sidelying to Sit Rolling: Supervision Sidelying to sit: Supervision, HOB elevated            Transfers Overall transfer level: Needs assistance Equipment used: Rolling walker (2 wheels) Transfers: Sit to/from Stand Sit to Stand: Supervision                 Balance Overall balance assessment: Needs assistance Sitting-balance support: No upper extremity supported Sitting balance-Leahy Scale: Good     Standing balance support: Bilateral upper extremity supported, Reliant on assistive device for balance, During functional activity Standing balance-Leahy Scale: Fair                             ADL either performed or assessed with clinical judgement   ADL  Overall ADL's : Needs assistance/impaired Eating/Feeding: Set up   Grooming: Set up                   Toilet Transfer: Supervision/safety;Rolling walker (2 wheels);BSC/3in1 Toilet Transfer Details (indicate cue type and reason): bsc over toilet Toileting- Clothing Manipulation and Hygiene: Supervision/safety;Sitting/lateral lean       Functional mobility during ADLs: Supervision/safety;Rolling walker (2 wheels)      Extremity/Trunk Assessment Upper Extremity Assessment Upper Extremity Assessment: Overall WFL for tasks assessed   Lower Extremity Assessment Lower Extremity Assessment: Defer to PT evaluation   Cervical / Trunk Assessment Cervical / Trunk Assessment: Back Surgery    Vision       Perception     Praxis     Communication Communication Communication: No apparent difficulties   Cognition Arousal: Alert Behavior During Therapy: WFL for tasks assessed/performed Cognition: No apparent impairments                               Following commands: Intact        Cueing   Cueing Techniques: Verbal cues  Exercises Other Exercises Other Exercises: edu re role of OT, role of rehab, discharge recommendations    Shoulder Instructions       General Comments spo2 >90% on RA throughout    Pertinent Vitals/ Pain       Pain Assessment Pain Assessment: 0-10 Pain Score: 9  Pain Location: back Pain Descriptors / Indicators: Grimacing, Discomfort Pain Intervention(s): Repositioned, Monitored during session  Home Living  Prior Functioning/Environment              Frequency  Min 3X/week        Progress Toward Goals  OT Goals(current goals can now be found in the care plan section)  Progress towards OT goals: Progressing toward goals  Acute Rehab OT Goals Time For Goal Achievement: 06/07/24  Plan      Co-evaluation                 AM-PAC OT 6 Clicks Daily  Activity     Outcome Measure   Help from another person eating meals?: None Help from another person taking care of personal grooming?: None Help from another person toileting, which includes using toliet, bedpan, or urinal?: A Little Help from another person bathing (including washing, rinsing, drying)?: A Little Help from another person to put on and taking off regular upper body clothing?: None Help from another person to put on and taking off regular lower body clothing?: A Lot 6 Click Score: 20    End of Session Equipment Utilized During Treatment: Rolling walker (2 wheels)  OT Visit Diagnosis: Other abnormalities of gait and mobility (R26.89)   Activity Tolerance Patient tolerated treatment well   Patient Left in chair;with call bell/phone within reach   Nurse Communication Mobility status        Time: 9095-9087 OT Time Calculation (min): 8 min  Charges: OT General Charges $OT Visit: 1 Visit OT Treatments $Therapeutic Activity: 8-22 mins  Therisa Sheffield, OTD OTR/L  05/25/24, 10:09 AM

## 2024-05-25 NOTE — Discharge Summary (Signed)
 Discharge Summary  Patient ID: Gabrielle Pineda MRN: [id number] DOB/AGE: 1955/10/23 68 y.o.  Admit date: 05/23/2024 Discharge date: 05/25/2024  Admission Diagnoses: Spondylolisthesis at L4-5 with mechanical back pain and radiculopathy/neurogenic claudication   Discharge Diagnoses:  Principal Problem:   Spinal stenosis, lumbar region, with neurogenic claudication Active Problems:   Spondylolisthesis at L4-L5 level   Spondylolisthesis of lumbar region   S/P lumbar fusion   Discharged Condition: good  Hospital Course:  Gabrielle Pineda is a 68 y.o presenting with lumbar spondylolisthesis and neurogenic claudication status post L4-5 MAS TLIF. Her intraoperative course was uncomplicated.  She was admitted for pain control and therapy evaluation.  On postop day 0 she experienced a small at her right incision site which improved with ice.  She has some ongoing pain and bruising throughout the remainder of hospitalization but not acute expanding hematoma was noted. She was seen and evaluated by therapy and deemed appropriate for discharge home with [facility] services. She was given prescriptions for pain medication, muscle relaxer, and stool softeners to take as needed  Consults: None  Significant Diagnostic Studies: NA  Treatments: surgery: as above. Please see separately dictated operative report for further details   Discharge Exam: Blood pressure (!) 159/79, pulse 80, temperature 98.7 F (37.1 C), temperature source Oral, resp. rate 18, height 5' 7 (1.702 m), weight 75.7 kg, SpO2 96%. AA Ox3 CNI   Strength:                                                                                                                          Side Iliopsoas Quads Hamstring PF DF EHL  R 5 5 5 5 3 4   L 5 5 5 5 5 5    Incisions: c/d/I. Large area of ecchymosis underlying right incision extending into flank. There is no obvious ongoing drainage. Underlying skin is soft.   Disposition: Discharge  disposition: 06-Home-Health Care Svc       Discharge Instructions     Incentive spirometry RT   Complete by: As directed    Remove dressing in 24 hours   Complete by: As directed       Allergies as of 05/25/2024       Reactions   Sulfasalazine Other (See Comments)   Does not remember reaction    Plaquenil [hydroxychloroquine] Rash        Medication List     STOP taking these medications    meloxicam  15 MG tablet Commonly known as: MOBIC    oxyCODONE -acetaminophen  5-325 MG tablet Commonly known as: Percocet       TAKE these medications    acetaminophen  500 MG tablet Commonly known as: TYLENOL  Take 2 tablets (1,000 mg total) by mouth every 6 (six) hours.   acyclovir  ointment 5 % Commonly known as: ZOVIRAX  :Topical Every 3 Hours as needed for lesion   albuterol  108 (90 Base) MCG/ACT inhaler Commonly known as: VENTOLIN  HFA INHALE TWO (2) PUFFS BY MOUTH AND INTO THE LUNGS EVERY 6  HOURS AS NEEDED FOR SHORTNESS OF BREATH   atorvastatin  40 MG tablet Commonly known as: LIPITOR Take 1 tablet (40 mg total) by mouth daily.   azelastine  0.1 % nasal spray Commonly known as: ASTELIN  Place 2 sprays into both nostrils 2 (two) times daily. Use in each nostril as directed   BRAINSTRONG MEMORY SUPPORT PO Take 2 capsules by mouth daily.   Breztri  Aerosphere 160-9-4.8 MCG/ACT Aero inhaler Generic drug: budesonide-glycopyrrolate -formoterol Inhale 2 puffs into the lungs in the morning and at bedtime.   carvedilol  12.5 MG tablet Commonly known as: COREG  TAKE ONE TABLET BY MOUTH AT BREAKFAST AND AT BEDTIME   ezetimibe  10 MG tablet Commonly known as: ZETIA  Take 1 tablet (10 mg total) by mouth daily.   ferrous sulfate 325 (65 FE) MG tablet Take 325 mg by mouth daily with breakfast.   fexofenadine 180 MG tablet Commonly known as: ALLEGRA Take 180 mg by mouth daily.   fluticasone  50 MCG/ACT nasal spray Commonly known as: FLONASE  Place 2 sprays into both nostrils  in the morning and at bedtime.   furosemide  20 MG tablet Commonly known as: LASIX  TAKE 1 TABLET BY MOUTH ONCE DAILY   gabapentin  400 MG capsule Commonly known as: Neurontin  Take 1 capsule (400 mg total) by mouth at bedtime.   ipratropium 0.03 % nasal spray Commonly known as: ATROVENT  Place 2 sprays into both nostrils 3 (three) times daily as needed for rhinitis.   leflunomide  20 MG tablet Commonly known as: ARAVA  Take 20 mg by mouth at bedtime.   losartan  25 MG tablet Commonly known as: COZAAR  Take 1 tablet (25 mg total) by mouth daily.   mirtazapine  15 MG tablet Commonly known as: REMERON  TAKE 1 TABLET BY MOUTH EVERY DAY AT BEDTIME   montelukast  10 MG tablet Commonly known as: SINGULAIR  TAKE 1 TABLET BY MOUTH EVERY DAY AT BEDTIME   multivitamin with minerals Tabs tablet Take 1 tablet by mouth daily.   Nebulizer Misc Compressor nebulizer to use with nebulized medications as instructed   oxyCODONE  5 MG immediate release tablet Commonly known as: Oxy IR/ROXICODONE  Take 1-2 tablets (5-10 mg total) by mouth every 3 (three) hours as needed for moderate pain (pain score 4-6).   pantoprazole  40 MG tablet Commonly known as: PROTONIX  Take 40 mg by mouth 2 (two) times daily.   polyethylene glycol 17 g packet Commonly known as: MIRALAX  / GLYCOLAX  Take 17 g by mouth daily as needed for mild constipation.   potassium chloride  SA 20 MEQ tablet Commonly known as: KLOR-CON  M TAKE 1 TABLET BY MOUTH EVERY MORNING   senna 8.6 MG Tabs tablet Commonly known as: SENOKOT Take 1 tablet (8.6 mg total) by mouth 2 (two) times daily.   tiZANidine  4 MG tablet Commonly known as: ZANAFLEX  Take 1 tablet (4 mg total) by mouth every 6 (six) hours as needed for muscle spasms. What changed:  how much to take when to take this   topiramate  25 MG tablet Commonly known as: Topamax  Take 1 tablet (25 mg total) by mouth 2 (two) times daily.   valACYclovir  1000 MG tablet Commonly known as:  VALTREX  Take 2 tablets daily for 5 days as needed for cold sores.   venlafaxine  XR 75 MG 24 hr capsule Commonly known as: EFFEXOR -XR TAKE 2 CAPSULES BY MOUTH DAILY WITH BREAKFAST *DOSE CHANGE*   Xiidra  5 % Soln Generic drug: Lifitegrast  Place 1 drop into both eyes daily as needed (dry eyes).         Signed:  Edsel Jama Goods 05/25/2024, 8:06 AM

## 2024-05-25 NOTE — TOC Transition Note (Signed)
 Transition of Care [facility]) - Discharge Note   Patient Details  Name: Gabrielle Pineda MRN: [id number] Date of Birth: [birth year]  Transition of Care [facility] : [facility]) CM/SW Contact:  Alvaro Louder, LCSW Phone Number: 05/25/2024, 8:56 AM   Clinical Narrative:    LCSWA confirmed with MD that patient is stable for discharge. LCSWA notified the patient and they are in agreement with discharge. LCSWA discussed PT recommendation of HH Patient was agreeable. LCSWA reached out to [facility] admissions coordinator an started service for patient. The patient reported that he would have a friend come pick him up at discharge.   TOC signing off   Final next level of care: Home w Home Health Services Barriers to Discharge: No Barriers Identified   Patient Goals and CMS Choice            Discharge Placement              Patient chooses bed at:  (Home) Patient to be transferred to facility by: Christopher Name of family member notified: [name]/Self Patient and family notified of of transfer: 05/25/24  Discharge Plan and Services Additional resources added to the After Visit Summary for                            [facility] Arranged: PT, OT [facility] Agency: [facility] Date [facility] Agency Contacted: 05/24/24   Representative spoke with at [facility] Agency: Dorothe  Social Drivers of Health (SDOH) Interventions SDOH Screenings   Food Insecurity: No Food Insecurity (05/23/2024)  Housing: Low Risk  (05/23/2024)  Transportation Needs: No Transportation Needs (05/23/2024)  Utilities: Not At Risk (05/23/2024)  Alcohol Screen: Low Risk  (11/26/2023)  Depression (PHQ2-9): Low Risk  (04/25/2024)  Financial Resource Strain: Low Risk  (01/27/2024)  Physical Activity: Insufficiently Active (01/27/2024)  Social Connections: Moderately Isolated (05/23/2024)  Stress: No Stress Concern Present (01/27/2024)  Tobacco Use: Medium Risk (05/23/2024)     Readmission Risk Interventions     No data to display

## 2024-05-25 NOTE — Progress Notes (Signed)
 DISCHARGE NOTE:   Pt dc with IV removed and pt education given. Pt received medications delivered to hospital room. Pt has both TED hose on and in place. Pt wheeled down to medical mall entrance by staff. Pts husband provided transportation.

## 2024-05-25 NOTE — Progress Notes (Signed)
 Physical Therapy Treatment Patient Details Name: Gabrielle Pineda MRN: [id number] DOB: 11/14/1955 Today's Date: 05/25/2024   History of Present Illness Gabrielle Pineda  has presented 10/07 for surgery, with the diagnosis of Spondylolisthesis at L4-5 with mechanical back pain and radiculopathy/neurogenic claudication.    PT Comments  Pt received up in chair, sat for 1 hour following OT session, now requesting back to bed. Pt demonstrated transfers, bed mobility, and use of bathroom with Supervision. Gait training with RW and CGA due to c/o 10/10 back pain, R LE numbness, L LE pain. Nursing notified. No LOB or buckling. Pt assisted back to bed. Nursing in with ice to relieve pain and edema. Pt cleared on stairs previously and has a RW and Rollator at home. Anticipated d/c home today with HHPT per MD.    If plan is discharge home, recommend the following: A little help with walking and/or transfers;A little help with bathing/dressing/bathroom   Can travel by private vehicle        Equipment Recommendations  None recommended by PT    Recommendations for Other Services       Precautions / Restrictions Precautions Precautions: Back Recall of Precautions/Restrictions: Intact Precaution/Restrictions Comments:  (Cues for log rolling) Restrictions Weight Bearing Restrictions Per Provider Order: No     Mobility  Bed Mobility Overal bed mobility: Needs Assistance Bed Mobility: Sit to Sidelying, Rolling Rolling: Supervision       Sit to sidelying: Supervision      Transfers Overall transfer level: Needs assistance Equipment used: Rolling walker (2 wheels) Transfers: Sit to/from Stand Sit to Stand: Supervision           General transfer comment:  (Pt able to raise to standing from chair and BSC with distanct Supervision)    Ambulation/Gait Ambulation/Gait assistance: Supervision Gait Distance (Feet): 120 Feet Assistive device: Rolling walker (2 wheels) Gait  Pattern/deviations: Step-through pattern, Decreased stride length Gait velocity:  (Slow cadence with c/o R LE numbness and L LE burning pain)     General Gait Details: decr   Stairs Stairs:  (Pt cleared on stairs during PT Eval)           Wheelchair Mobility     Tilt Bed    Modified Rankin (Stroke Patients Only)       Balance Overall balance assessment: Needs assistance Sitting-balance support: No upper extremity supported Sitting balance-Leahy Scale: Good     Standing balance support: Bilateral upper extremity supported, Reliant on assistive device for balance, During functional activity Standing balance-Leahy Scale: Fair                              Hotel manager: No apparent difficulties  Cognition Arousal: Alert Behavior During Therapy: WFL for tasks assessed/performed                             Following commands: Intact      Cueing    Exercises Other Exercises Other Exercises: edu re role of PT, role of rehab, discharge recommendations, log rolling, pain management    General Comments General comments (skin integrity, edema, etc.): spo2 >90% on RA throughout      Pertinent Vitals/Pain Pain Assessment Pain Assessment: 0-10 Pain Score: 10-Worst pain ever Pain Location: back Pain Descriptors / Indicators: Grimacing, Discomfort Pain Intervention(s): Limited activity within patient's tolerance    Home Living  Prior Function            PT Goals (current goals can now be found in the care plan section) Acute Rehab PT Goals Patient Stated Goal: Pt wants to go home Progress towards PT goals: Progressing toward goals    Frequency    Min 4X/week      PT Plan      Co-evaluation              AM-PAC PT 6 Clicks Mobility   Outcome Measure  Help needed turning from your back to your side while in a flat bed without using bedrails?: A  Little Help needed moving from lying on your back to sitting on the side of a flat bed without using bedrails?: A Little Help needed moving to and from a bed to a chair (including a wheelchair)?: A Little Help needed standing up from a chair using your arms (e.g., wheelchair or bedside chair)?: A Little Help needed to walk in hospital room?: A Little Help needed climbing 3-5 steps with a railing? : A Little 6 Click Score: 18    End of Session Equipment Utilized During Treatment: Gait belt Activity Tolerance: Patient limited by pain Patient left: in bed;with call bell/phone within reach;with bed alarm set Nurse Communication: Mobility status;Patient requests pain meds PT Visit Diagnosis: Muscle weakness (generalized) (M62.81);Pain;Other abnormalities of gait and mobility (R26.89) Pain - Right/Left: Right (Left) Pain - part of body: Leg (Back)     Time: 8997-8973 PT Time Calculation (min) (ACUTE ONLY): 24 min  Charges:    $Gait Training: 8-22 mins $Therapeutic Activity: 8-22 mins PT General Charges $$ ACUTE PT VISIT: 1 Visit                    Gabrielle Pineda, PTA  Gabrielle Pineda 05/25/2024, 10:30 AM

## 2024-05-25 NOTE — Progress Notes (Signed)
   Neurosurgery Progress Note  History: Gabrielle Pineda is s/p L4-5 MAS TLIF  POD2: Pt doing well this morning despite restless night. She reports her legs are feeling ok and the PO pain medication is helping  POD1: Pt experienced right sided hematoma overnight treated with ice and keeping her flat. Today she states she feels better but is having some trouble with pain control.   Physical Exam: Vitals:   05/25/24 0405 05/25/24 0727  BP: (!) 158/71 (!) 159/79  Pulse: 84 80  Resp: 18 18  Temp: 99 F (37.2 C) 98.7 F (37.1 C)  SpO2: 97% 96%    AA Ox3 CNI  Strength:                                                                                                                          Side Iliopsoas Quads Hamstring PF DF EHL  R 5 5 5 5 3 4   L 5 5 5 5 5 5    Incisions: c/d/I. Large area of ecchymosis underlying right incision extending into flank. There is no obvious ongoing drainage. Underlying skin is soft.   Data:  Other tests/results: NA  Assessment/Plan:  Gabrielle Pineda is a 68 y.o presenting with spondylolisthesis and neurogenic claudiation s/p L4-5 MAS TLIF  - mobilize - pain control; changed to dilaudid   - DVT prophylaxis - PTOT; recommending HH. Will plan for d/c this afternoon pending final therapy evaluation.   Edsel Goods PA-C Department of Neurosurgery

## 2024-05-25 NOTE — Plan of Care (Signed)

## 2024-05-26 ENCOUNTER — Telehealth: Payer: Self-pay

## 2024-05-26 ENCOUNTER — Ambulatory Visit: Admitting: Family Medicine

## 2024-05-26 NOTE — Telephone Encounter (Signed)
 Copied from CRM #[id number]. Topic: Clinical - Home Health Verbal Orders >> May 26, 2024 12:03 PM Nathanel BROCKS wrote: [facility] went out to start pt and patient ask her to come back on Monday.

## 2024-05-29 ENCOUNTER — Telehealth: Payer: Self-pay

## 2024-05-29 NOTE — Telephone Encounter (Signed)
 Copied from CRM [id number]. Topic: Clinical - Home Health Verbal Orders >> May 29, 2024 11:47 AM Emylou G wrote: Caller/Agency: Darryle Hock w/[facility] Callback Number: [phone] secured line Service Requested: Physical Therapy Frequency: 2w2 and 1w4 Any new concerns about the patient? Yes - she is having trouble managing pain 7 out of 10 since her surgery.  3 medication interaction - computer flagged tiZANidine  (ZANAFLEX ) 4 MG tablet and valACYclovir  (VALTREX ) 1000 MG tablet interaction issues Budeson-Glycopyrrol-Formoterol (BREZTRI  AEROSPHERE) 160-9-4.8 MCG/ACT AERO and carvedilol  (COREG ) 12.5 MG tablet albuterol  (VENTOLIN  HFA) 108 (90 Base) MCG/ACT inhaler and  carvedilol  (COREG ) 12.5 MG tablet

## 2024-05-31 ENCOUNTER — Other Ambulatory Visit: Payer: Self-pay | Admitting: Pharmacist

## 2024-05-31 ENCOUNTER — Other Ambulatory Visit: Payer: Medicare Other | Admitting: Pharmacist

## 2024-05-31 DIAGNOSIS — J209 Acute bronchitis, unspecified: Secondary | ICD-10-CM

## 2024-05-31 DIAGNOSIS — J449 Chronic obstructive pulmonary disease, unspecified: Secondary | ICD-10-CM

## 2024-05-31 NOTE — Progress Notes (Signed)
 05/31/2024 Name: Gabrielle Pineda MRN: [id number] DOB: 28-Nov-1955  Chief Complaint  Patient presents with   Medication Assistance    Gabrielle Pineda is a 68 y.o. year old female who presented for a telephone visit.   They were referred to the pharmacist by their PCP for assistance in managing medication access.      Subjective:   Care Team: Primary Care Provider: [name], DO; Next Scheduled Visit: 06/26/2024 Rheumatology: Defoor, Elsie Conch, PA; Next Scheduled Visit: 07/18/2024 Hematology: Babara Call, MD Pain Specialist: Tobie Emmy POUR, NP; Next Scheduled Visit: 07/18/2024 Pulmonology: Tamea Dedra CROME, MD; Next Scheduled Visit: 06/02/2024 Psychiatry: Eappen, Saramma, MD Neurosurgery: Claudene Penne ORN, MD; Next Scheduled Visit: 06/06/2024  GI Specialist: Mevelyn Romero Pander, PA   Medication Access/Adherence  Current Pharmacy:  [facility] [place]  - [place], [place] - [address] [place] [address] Phone: [phone] Fax: [phone]  [facility] [address]), [place] - [address] ([address]) [place] [address] Phone: [phone] Fax: [phone]  [facility] [address] [name] Phone: [phone] Fax: [phone]   Patient reports affordability concerns with their medications: No Patient reports access/transportation concerns to their pharmacy: No  Patient reports adherence concerns with their medications:  No     Reports now uses Linzess  only on an as needed basis as discussed with her GI Specialist    COPD/Allergic Rhinitis:   Current medications: - Breztri  inhaler - 2 puffs in morning and 2 puffs at bedtime - albuterol  nebulizer solution - 1 vial via nebulizer every 4 hours as needed for wheezing/shortness of breath - albuterol  inhaler - 2 puffs every 6 hours as needed for  wheezing/shortness of breath - Fexofenadine 180 mg daily - montelukast  10 mg at bedtime - on overnight oxygen    Confirms rinsing out mouth after each use of Breztri  inhaler   Current medication access support: Enrolled in patient assistance for Breztri  inhaler from [employer] through 08/16/2024   Objective:    Lab Results  Component Value Date   CREATININE 1.31 (H) 05/15/2024   BUN 18 05/15/2024   NA 139 05/15/2024   K 4.3 05/15/2024   CL 110 05/15/2024   CO2 21 (L) 05/15/2024    Lab Results  Component Value Date   CHOL 184 07/19/2023   HDL 60 07/19/2023   LDLCALC 97 07/19/2023   TRIG 156 (H) 07/19/2023   CHOLHDL 3.1 07/19/2023    Current Outpatient Medications on File Prior to Visit  Medication Sig Dispense Refill   Budeson-Glycopyrrol-Formoterol (BREZTRI  AEROSPHERE) 160-9-4.8 MCG/ACT AERO Inhale 2 puffs into the lungs in the morning and at bedtime. 3 each 3   fexofenadine (ALLEGRA) 180 MG tablet Take 180 mg by mouth daily.     ipratropium (ATROVENT ) 0.03 % nasal spray Place 2 sprays into both nostrils 3 (three) times daily as needed for rhinitis. 30 mL 1   montelukast  (SINGULAIR ) 10 MG tablet TAKE 1 TABLET BY MOUTH EVERY DAY AT BEDTIME 30 tablet 11   acetaminophen  (TYLENOL ) 500 MG tablet Take 2 tablets (1,000 mg total) by mouth every 6 (six) hours.     acyclovir  ointment (ZOVIRAX ) 5 % :Topical Every 3 Hours as needed for lesion 30 g 1   albuterol  (VENTOLIN  HFA) 108 (90 Base) MCG/ACT inhaler INHALE TWO (2) PUFFS BY MOUTH AND INTO THE LUNGS EVERY 6 HOURS AS NEEDED FOR SHORTNESS OF BREATH 8.5  g 10   atorvastatin  (LIPITOR) 40 MG tablet Take 1 tablet (40 mg total) by mouth daily. 90 tablet 3   azelastine  (ASTELIN ) 0.1 % nasal spray Place 2 sprays into both nostrils 2 (two) times daily. Use in each nostril as directed (Patient not taking: Reported on 05/11/2024) 30 mL 1   carvedilol  (COREG ) 12.5 MG tablet TAKE ONE TABLET BY MOUTH AT BREAKFAST AND AT BEDTIME 180 tablet 3   ezetimibe   (ZETIA ) 10 MG tablet Take 1 tablet (10 mg total) by mouth daily. 90 tablet 3   ferrous sulfate 325 (65 FE) MG tablet Take 325 mg by mouth daily with breakfast.     furosemide  (LASIX ) 20 MG tablet TAKE 1 TABLET BY MOUTH ONCE DAILY 30 tablet 2   gabapentin  (NEURONTIN ) 400 MG capsule Take 1 capsule (400 mg total) by mouth at bedtime. 30 capsule 5   leflunomide  (ARAVA ) 20 MG tablet Take 20 mg by mouth at bedtime.     losartan  (COZAAR ) 25 MG tablet Take 1 tablet (25 mg total) by mouth daily. 90 tablet 3   mirtazapine  (REMERON ) 15 MG tablet TAKE 1 TABLET BY MOUTH EVERY DAY AT BEDTIME 30 tablet 10   Misc Natural Products (BRAINSTRONG MEMORY SUPPORT PO) Take 2 capsules by mouth daily.     Multiple Vitamin (MULTIVITAMIN WITH MINERALS) TABS tablet Take 1 tablet by mouth daily.     Nebulizer MISC Compressor nebulizer to use with nebulized medications as instructed 1 each 0   oxyCODONE  (OXY IR/ROXICODONE ) 5 MG immediate release tablet Take 1-2 tablets (5-10 mg total) by mouth every 3 (three) hours as needed for moderate pain (pain score 4-6). 60 tablet 0   pantoprazole  (PROTONIX ) 40 MG tablet Take 40 mg by mouth 2 (two) times daily.     polyethylene glycol powder (GLYCOLAX /MIRALAX ) 17 GM/SCOOP powder Take 17 g by mouth daily as needed for mild constipation. Dissolve 1 capful (17g) in 4-8 ounces of liquid and take by mouth daily. 238 g 0   potassium chloride  SA (KLOR-CON  M) 20 MEQ tablet TAKE 1 TABLET BY MOUTH EVERY MORNING 90 tablet 11   senna (SENOKOT) 8.6 MG TABS tablet Take 1 tablet (8.6 mg total) by mouth 2 (two) times daily. 30 tablet 0   tiZANidine  (ZANAFLEX ) 4 MG tablet Take 1 tablet (4 mg total) by mouth every 6 (six) hours as needed for muscle spasms. 30 tablet 0   topiramate  (TOPAMAX ) 25 MG tablet Take 1 tablet (25 mg total) by mouth 2 (two) times daily. 60 tablet 11   valACYclovir  (VALTREX ) 1000 MG tablet Take 2 tablets daily for 5 days as needed for cold sores. 10 tablet 11   venlafaxine  XR  (EFFEXOR -XR) 75 MG 24 hr capsule TAKE 2 CAPSULES BY MOUTH DAILY WITH BREAKFAST *DOSE CHANGE* 60 capsule 11   XIIDRA  5 % SOLN Place 1 drop into both eyes daily as needed (dry eyes).     No current facility-administered medications on file prior to visit.       Assessment/Plan:   Will send message to CPhT to ask her to support patient with applying for re-enrollment in patient assistance from [employer] for Breztri  inhaler and [employer] for Linzess    COPD: - Reviewed appropriate inhaler technique.  - Identify patient in need of refill of albuterol  inhaler and current prescription is expired As requested, send message to PCP requesting renewal be sent to [facility] for patient - Patient to follow up with [employer]Me assistance program as needed for refills of Breztri   inhaler    Follow Up Plan: Schedule follow up for patient to speak with Clinical Pharmacist Peyton Ferries by telephone on 08/24/2024 at 10:00 AM       Sharyle Sia, PharmD, [facility] [phone]

## 2024-05-31 NOTE — Patient Instructions (Signed)
 Goals Addressed             This Visit's Progress    Pharmacy Goals       Please watch the mail for an envelope from [facility] containing the patient assistance program application. Please complete this application and bring to office to have it faxed back to Attention: Shasta Spear at Fax # [phone] along with a copy of your Medicare Part D prescription card and a copy of your proof of income document.  If you need to call Shasta, you can reach her at [phone].   If you need to reach out to patient assistance programs regarding refills or to find out the status of your application, you can do so by calling:  For Linzess , [employer] at [phone] For Breztri , [employer] at [phone]   Sharyle Sia, PharmD, [facility] [phone]

## 2024-05-31 NOTE — Progress Notes (Unsigned)
 Patient is in need of refill of her albuterol  inhaler and current prescription is expired.  Would you please send renewal of albuterol  to [facility] for patient (order pended)?  Thank you!  Gabrielle Pineda

## 2024-06-01 ENCOUNTER — Telehealth: Payer: Self-pay

## 2024-06-01 MED ORDER — ALBUTEROL SULFATE HFA 108 (90 BASE) MCG/ACT IN AERS
2.0000 | INHALATION_SPRAY | Freq: Four times a day (QID) | RESPIRATORY_TRACT | 10 refills | Status: AC | PRN
Start: 2024-06-01 — End: ?

## 2024-06-01 NOTE — Telephone Encounter (Signed)
 Mrs Mincy called in. She reports that [facility] came before she took any pain medication and they told her they are obligated to call if the patient reports pain over a certain level. She states her pain is under control and she does not need anything at this time. I encouraged her to contact us  if she needs anything. Otherwise, we will see her as scheduled.

## 2024-06-01 NOTE — Telephone Encounter (Signed)
 [facility] called us  and they spoke with the pt & wanted to report that their pain was a 6 out of 10. They did not provide any other information.

## 2024-06-02 ENCOUNTER — Ambulatory Visit: Admitting: Adult Health

## 2024-06-02 ENCOUNTER — Encounter: Payer: Self-pay | Admitting: Adult Health

## 2024-06-02 ENCOUNTER — Telehealth: Payer: Self-pay

## 2024-06-02 DIAGNOSIS — Z87891 Personal history of nicotine dependence: Secondary | ICD-10-CM

## 2024-06-02 DIAGNOSIS — Z4789 Encounter for other orthopedic aftercare: Secondary | ICD-10-CM | POA: Diagnosis not present

## 2024-06-02 DIAGNOSIS — M4315 Spondylolisthesis, thoracolumbar region: Secondary | ICD-10-CM | POA: Diagnosis not present

## 2024-06-02 DIAGNOSIS — M48062 Spinal stenosis, lumbar region with neurogenic claudication: Secondary | ICD-10-CM | POA: Diagnosis not present

## 2024-06-02 DIAGNOSIS — Z79899 Other long term (current) drug therapy: Secondary | ICD-10-CM | POA: Diagnosis not present

## 2024-06-02 DIAGNOSIS — M5416 Radiculopathy, lumbar region: Secondary | ICD-10-CM | POA: Diagnosis not present

## 2024-06-02 DIAGNOSIS — Z981 Arthrodesis status: Secondary | ICD-10-CM | POA: Diagnosis not present

## 2024-06-02 NOTE — Telephone Encounter (Signed)
 Copied from CRM [id number]. Topic: Clinical - Home Health Verbal Orders >> Jun 01, 2024  4:49 PM Winona SAUNDERS wrote: Caller/Agency: Will OT calling from [facility] Callback Number: [phone] Service Requested: Occupational Therapy Frequency: 1x a week for 4 weeks  Any new concerns about the patient? No

## 2024-06-02 NOTE — Progress Notes (Signed)
  Virtual Visit via Telephone Note  I connected with Gabrielle Pineda , 06/02/24 11:34 AM by a telemedicine application and verified that I am speaking with the correct person using two identifiers.  Location: Patient: home Provider: home   I discussed the limitations of evaluation and management by telemedicine and the availability of in person appointments. The patient expressed understanding and agreed to proceed.   Shared Decision Making Visit Lung Cancer Screening Program [phone])   Eligibility: 68 y.o. Pack Years Smoking History Calculation = 96 pack years  (# packs/per year x # years smoked) Recent History of coughing up blood  no Unexplained weight loss? no ( >Than 15 pounds within the last 6 months ) Prior History Lung / other cancer no (Diagnosis within the last 5 years already requiring surveillance chest CT Scans). Smoking Status Former Smoker Former Smokers: Years since quit: 3 years  Quit Date: 2022  Visit Components: Discussion included one or more decision making aids. YES Discussion included risk/benefits of screening. YES Discussion included potential follow up diagnostic testing for abnormal scans. YES Discussion included meaning and risk of over diagnosis. YES Discussion included meaning and risk of False Positives. YES Discussion included meaning of total radiation exposure. YES  Counseling Included: Importance of adherence to annual lung cancer LDCT screening. YES Impact of comorbidities on ability to participate in the program. YES Ability and willingness to under diagnostic treatment. YES  Smoking Cessation Counseling: Former Smokers:  Discussed the importance of maintaining cigarette abstinence. yes Diagnosis Code: Personal History of Nicotine  Dependence. S12.108 Information about tobacco cessation classes and interventions provided to patient. Yes Patient provided with ticket for LDCT Scan. yes Written Order for Lung Cancer Screening with  LDCT placed in Epic. Yes (CT Chest Lung Cancer Screening Low Dose W/O CM) [id number]  Z12.2-Screening of respiratory organs Z87.891-Personal history of nicotine  dependence   [name] 06/02/24

## 2024-06-02 NOTE — Patient Instructions (Signed)

## 2024-06-02 NOTE — Telephone Encounter (Signed)
 Called and lvm on secured line advising of approval.

## 2024-06-05 ENCOUNTER — Telehealth: Payer: Self-pay

## 2024-06-05 NOTE — Telephone Encounter (Signed)
 Mail out pt portion of [employer] (Linzess ) and [employer] (Breztri ) faxed provider portion.

## 2024-06-06 ENCOUNTER — Ambulatory Visit (INDEPENDENT_AMBULATORY_CARE_PROVIDER_SITE_OTHER)

## 2024-06-06 ENCOUNTER — Ambulatory Visit (INDEPENDENT_AMBULATORY_CARE_PROVIDER_SITE_OTHER): Admitting: Physician Assistant

## 2024-06-06 VITALS — BP 110/70 | HR 115 | Temp 98.7°F | Wt 169.0 lb

## 2024-06-06 DIAGNOSIS — R0602 Shortness of breath: Secondary | ICD-10-CM

## 2024-06-06 DIAGNOSIS — M48062 Spinal stenosis, lumbar region with neurogenic claudication: Secondary | ICD-10-CM

## 2024-06-06 DIAGNOSIS — Z09 Encounter for follow-up examination after completed treatment for conditions other than malignant neoplasm: Secondary | ICD-10-CM

## 2024-06-06 MED ORDER — OXYCODONE HCL 5 MG PO TABS: 5.0000 mg | ORAL_TABLET | ORAL | 0 refills | Status: DC | PRN

## 2024-06-06 MED ORDER — TIZANIDINE HCL 4 MG PO TABS: 4.0000 mg | ORAL_TABLET | Freq: Four times a day (QID) | ORAL | 0 refills | Status: DC | PRN

## 2024-06-06 NOTE — Progress Notes (Signed)
   REFERRING PHYSICIAN:  Donzella Lauraine LOISE Rosalea [address] [place],  [name]  DOS: 05/23/24, L4-5 MAS TLIF, 5/20 SCSD battery exhange  HISTORY OF PRESENT ILLNESS: Gabrielle Pineda is approximately 2 weeks status post L4-5 MAS TLIF. she is doing okay, but experiencing quite a bit of pain.  She does notice some improvements, but is having to take her pain medication quite a bit.  Patient states that she is also have some shortness of breath and coughing since being in the hospital.  She continues to use oxygen  at night for sleep and use her COPD inhalers.  PHYSICAL EXAMINATION:  General: Patient is well developed, well nourished, calm, collected, and in no apparent distress.   NEUROLOGICAL:  General: In no acute distress.   Awake, alert, oriented to person, place, and time.  Pupils equal round and reactive to light.  Facial tone is symmetric.   Patient wheezing on examination.   Strength:            Side Iliopsoas Quads Hamstring PF DF EHL  R 5 5 5 5 4 4   L 5 5 5 5 5 5    Incisions c/d/i   ROS (Neurologic):  Negative except as noted above  IMAGING: Chest x-ray to be completed in clinic today.  ASSESSMENT/PLAN:  Gabrielle Pineda is doing well approximately 2 weeks after L4-5 MAS TLIF.  She is in pain, some slight improvement compared to prior to surgery.  We discussed her pain regimen.  Refills were given.  Patient was having some shortness of breath during her clinic visit.  Patient has COPD and shortness of breath at baseline, but she does notice been worse since she was in the hospital for her surgery.  She also endorses a cough.  She continues her inhalers for her COPD and uses oxygen  at night.  She is tachycardic today at 115, but to room air at 96% O2.  Discussed with the patient with increased shortness of breath and tachycardia my formal recommendation would be for her to go to the ER to ensure that she does not have a PE.  Will obtain x-rays today in  clinic and have those reviewed.  Red flag symptoms were reviewed in which again she was told that she should go to the ER.  I have advised the patient to lift up to 10 pounds until 6 weeks after surgery, then increase up to 25 pounds until 12 weeks after surgery.  After 12 weeks post-op, the patient advised to increase activity as tolerated.  Advised to contact the office if any questions or concerns arise.  Lyle Decamp PA-C Department of neurosurgery

## 2024-06-06 NOTE — Telephone Encounter (Signed)
 Advised

## 2024-06-07 ENCOUNTER — Ambulatory Visit: Payer: Self-pay | Admitting: Physician Assistant

## 2024-06-07 NOTE — Telephone Encounter (Signed)
 Received provider portion AZ[employer]ME Breztri  waiting on pt portion.

## 2024-06-08 ENCOUNTER — Telehealth: Payer: Self-pay

## 2024-06-08 NOTE — Telephone Encounter (Signed)
 Copied from CRM [id number]. Topic: General - Other >> Jun 07, 2024  3:11 PM Emylou G wrote: Reason for CRM: Madeleine w/[facility] - [phone] About to start therapy her pain is already 7-10 ( in her back ) - needed to relay to us .

## 2024-06-09 NOTE — Progress Notes (Signed)
 Pharmacy Quality Measure Review  This patient is appearing on a report for being at risk of failing the adherence measure for cholesterol (statin) and hypertension (ACEi/ARB) medications this calendar year.   Medication: atorvastatin  Last fill date: 05/30/24 for 30 day supply  Medication: losartan  Last fill date: 05/30/24 for 30 day supply  Insurance report was not up to date. No action needed at this time.   Javen Hinderliter E. Marsh, PharmD Clinical Pharmacist [facility] [phone]

## 2024-06-12 ENCOUNTER — Ambulatory Visit: Payer: Self-pay

## 2024-06-12 NOTE — Telephone Encounter (Signed)
 FYI.. patient has an appointment with you on 06/13/2024.

## 2024-06-12 NOTE — Telephone Encounter (Signed)
 FYI Only or Action Required?: FYI only for provider.  Patient was last seen in primary care on 01/31/2024 by Ostwalt, Janna, PA-C.  Called Nurse Triage reporting Cough.  Symptoms began several weeks ago.  Interventions attempted: OTC medications: Mucinex  and Prescription medications: Breztri .  Symptoms are: unchanged.  Triage Disposition: See PCP When Office is Open (Within 3 Days)  Patient/caregiver understands and will follow disposition?: Yes Reason for Disposition  Cough has been present for > 3 weeks  Answer Assessment - Initial Assessment Questions Been using Zyrtec and Mucinex  and Breztri  inhaler with mild relief. O2 while on phone with this RN was 95% and HR 76.   1. ONSET: When did the cough begin?      2-3 weeks  2. SEVERITY: How bad is the cough today?      Moderate  3. SPUTUM: Describe the color of your sputum (e.g., none, dry cough; clear, white, yellow, green)     Green mucous  4. HEMOPTYSIS: Are you coughing up any blood? If Yes, ask: How much? (e.g., flecks, streaks, tablespoons, etc.)     Denies  5. DIFFICULTY BREATHING: Are you having difficulty breathing? If Yes, ask: How bad is it? (e.g., mild, moderate, severe)      Denies  6. FEVER: Do you have a fever? If Yes, ask: What is your temperature, how was it measured, and when did it start?     Denies  7. LUNG HISTORY: Do you have any history of lung disease?  (e.g., pulmonary embolus, asthma, emphysema)     COPD  8. OTHER SYMPTOMS: Do you have any other symptoms? (e.g., runny nose, wheezing, chest pain)       Wheezing more, denies chest pain  Protocols used: Cough - Acute Productive-A-AH  Copied from CRM #[id number]. Topic: Clinical - Red Word Triage >> Jun 12, 2024  1:51 PM Rachelle R wrote: [facility] that prompted transfer to Nurse Triage: Patient states she has a lot of chest congestion and productive cough with green mucous for the 2-3 week. Says has been getting worse.

## 2024-06-13 ENCOUNTER — Telehealth: Payer: Self-pay | Admitting: Physician Assistant

## 2024-06-13 ENCOUNTER — Ambulatory Visit (INDEPENDENT_AMBULATORY_CARE_PROVIDER_SITE_OTHER)

## 2024-06-13 ENCOUNTER — Other Ambulatory Visit: Payer: Self-pay | Admitting: Physician Assistant

## 2024-06-13 VITALS — BP 107/88 | HR 84 | Resp 16 | Ht 67.0 in | Wt 168.0 lb

## 2024-06-13 DIAGNOSIS — J441 Chronic obstructive pulmonary disease with (acute) exacerbation: Secondary | ICD-10-CM

## 2024-06-13 MED ORDER — OXYCODONE HCL 5 MG PO TABS: 5.0000 mg | ORAL_TABLET | ORAL | 0 refills | Status: DC | PRN

## 2024-06-13 MED ORDER — PREDNISONE 20 MG PO TABS: 40.0000 mg | ORAL_TABLET | Freq: Every day | ORAL | 0 refills | Status: DC

## 2024-06-13 MED ORDER — AZITHROMYCIN 500 MG PO TABS: 500.0000 mg | ORAL_TABLET | Freq: Every day | ORAL | 0 refills | Status: AC

## 2024-06-13 MED ORDER — PREDNISONE 20 MG PO TABS: 40.0000 mg | ORAL_TABLET | Freq: Every day | ORAL | 0 refills | Status: AC

## 2024-06-13 MED ORDER — AZITHROMYCIN 500 MG PO TABS: 500.0000 mg | ORAL_TABLET | Freq: Every day | ORAL | 0 refills | Status: DC

## 2024-06-13 NOTE — Telephone Encounter (Signed)
 Date10/28/25 Provider FRISBIE Med Requesting oxycodone  5mg  Pharmacy [facility] [address] Patient contact [phone]

## 2024-06-13 NOTE — Progress Notes (Signed)
 Acute visit   Patient: Gabrielle Pineda   DOB: 04/27/1956   68 y.o. Female  MRN: [id number] PCP: [name], DO   Chief Complaint  Patient presents with   Acute Visit    Cough ( 3ks with relief with OTC)    Subjective    Discussed the use of AI scribe software for clinical note transcription with the patient, who gave verbal consent to proceed.  History of Present Illness Gabrielle Pineda is a 68 year old female with COPD who presents with a persistent cough and shortness of breath. She is accompanied by her husband, Christopher.  She has been experiencing a persistent cough and shortness of breath on exertion that have progressively worsened. She regularly uses her inhalers, including Breztri , but has not taken antibiotics or steroids since the onset of her symptoms. A recent chest x-ray showed no evidence of pneumonia. She has been using over-the-counter medications such as Zyrtec, Mucinex , and Alka Seltzer for symptom relief.  She feels cold all the time, a symptom that has been ongoing, but denies fever or chills. She experiences headaches but denies chest pain. There is no swelling in her legs. She feels like she has fluid in her ears.  Her current medications for COPD include Breztri , which she uses regularly, and an albuterol  inhaler, though she has not been using it more frequently. She has a history of recent back surgery, which is relevant to her current health status.   Review of systems as noted in HPI.   Objective    BP 107/88 (BP Location: Left Arm, Patient Position: Sitting, Cuff Size: Normal)   Pulse 84   Resp 16   Ht 5' 7 (1.702 m)   Wt 168 lb (76.2 kg)   SpO2 96%   BMI 26.31 kg/m  Physical Exam Constitutional:      Appearance: Normal appearance.  HENT:     Head: Normocephalic and atraumatic.     Right Ear: Ear canal and external ear normal. A middle ear effusion is present.     Left Ear: Ear canal and external ear normal. A middle ear effusion is  present.     Mouth/Throat:     Mouth: Mucous membranes are moist.  Eyes:     Pupils: Pupils are equal, round, and reactive to light.  Cardiovascular:     Rate and Rhythm: Normal rate and regular rhythm.     Heart sounds: Normal heart sounds.  Pulmonary:     Effort: Pulmonary effort is normal. No respiratory distress.     Breath sounds: Wheezing present. No rhonchi.  Skin:    General: Skin is warm.  Neurological:     General: No focal deficit present.     Mental Status: She is alert.       No results found for any visits on 06/13/24.  Assessment & Plan     Problem List Items Addressed This Visit   None Visit Diagnoses       COPD with acute exacerbation (HCC)    -  Primary   Relevant Medications   azithromycin  (ZITHROMAX ) 500 MG tablet   predniSONE  (DELTASONE ) 20 MG tablet      Assessment & Plan COPD exacerbation Exacerbation likely due to viral infection, with persistent symptoms post-hospitalization. Has been using Breztri  as prescribed. Wheezing noted on exam. SpO2 at 96%. No evidence of pneumonia on recent chest x-ray. - Prescribe prednisone , two tablets once daily for five days. - Prescribe azithromycin ,  one tablet daily for three days. - Advise albuterol  inhaler as needed for wheezing or dyspnea on exertion - Continue Breztri  inhaler. - Instruct to return if symptoms persist or worsen within a week.   Meds ordered this encounter  Medications   DISCONTD: predniSONE  (DELTASONE ) 20 MG tablet    Sig: Take 2 tablets (40 mg total) by mouth daily with breakfast for 5 days.    Dispense:  10 tablet    Refill:  0   DISCONTD: azithromycin  (ZITHROMAX ) 500 MG tablet    Sig: Take 1 tablet (500 mg total) by mouth daily for 3 days.    Dispense:  3 tablet    Refill:  0   azithromycin  (ZITHROMAX ) 500 MG tablet    Sig: Take 1 tablet (500 mg total) by mouth daily for 3 days.    Dispense:  3 tablet    Refill:  0   predniSONE  (DELTASONE ) 20 MG tablet    Sig: Take 2 tablets  (40 mg total) by mouth daily with breakfast for 5 days.    Dispense:  10 tablet    Refill:  0     No follow-ups on file.      Isaiah DELENA Pepper, MD  [facility] [phone] (phone) [phone] (fax)

## 2024-06-14 NOTE — Telephone Encounter (Signed)
 Noted

## 2024-06-15 ENCOUNTER — Encounter: Payer: Self-pay | Admitting: Neurosurgery

## 2024-06-19 ENCOUNTER — Other Ambulatory Visit (HOSPITAL_COMMUNITY): Payer: Self-pay

## 2024-06-21 ENCOUNTER — Ambulatory Visit: Payer: Self-pay

## 2024-06-21 NOTE — Telephone Encounter (Signed)
 Received pt portion pap [employer] Linzess  and proof of income waiting on provider portion.

## 2024-06-21 NOTE — Telephone Encounter (Signed)
 Received [employer]ME Breztri  from provider office faxed to [employer]ME today

## 2024-06-21 NOTE — Telephone Encounter (Signed)
 FYI Only or Action Required?: FYI only for provider: appointment scheduled on 11/6.  Patient was last seen in primary care on 06/13/2024 by Franchot Isaiah LABOR, MD.  Called Nurse Triage reporting Pain.  Symptoms began a week ago.  Interventions attempted: OTC medications: ibuprofen   and Prescription medications: oxycodone .  Symptoms are: gradually worsening.  Triage Disposition: See HCP Within 4 Hours (Or PCP Triage)  Patient/caregiver understands and will follow disposition?: No  Copied from CRM [id number]. Topic: Clinical - Red Word Triage >> Jun 21, 2024  9:51 AM Ivette P wrote: [facility] that prompted transfer to Nurse Triage: 9 out 10 pain, took medication at 8 oclock pain is sitll high.    [facility] Reason for Disposition  [1] SEVERE neck pain (e.g., excruciating, unable to do any normal activities) AND [2] not improved after 2 hours of pain medicine  Answer Assessment - Initial Assessment Questions Patient with recent URI s/p abx and prednisone . About a week ago her right neck started to hurt. She is unable to turn her neck fully to the right. Hurts to move up and down and to the left. Denies fever, headache, numbness and tingling or weakness in the right arm. Feels like she might have pulled a muscle coughing. Took ibuprofen  and oxycodone  with no relief, pain 9/10. Patient to try heat and ice, and ibuprofen  again to help with any inflammation.  [facility] with HH on site. Vitals are stable, no neurological deficits. Patient with recent Lumbar Spinal surgery. Appt made for patient tomorrow, 11/6. Patient wants to try Care Advise overnight and if helping, will cancel appt.  ED/UC precautions advised and understood.   1. ONSET: When did the pain begin?      Yesterday  2. LOCATION: Where does it hurt?      Right neck  3. PATTERN Does the pain come and go, or has it been constant since it started?      Gradual pain getting worse-constant 4. SEVERITY: How bad is  the pain?  (Scale 0-10; or none or slight stiffness, mild, moderate, severe)     9-10/10, was 7/10 earlier in the weak  5. RADIATION: Does the pain go anywhere else, shoot into your arms?     Denies  6. CORD SYMPTOMS: Any weakness or numbness of the arms or legs?     Denies  7. CAUSE: What do you think is causing the neck pain?     Unsure cause- sleeps on her back in the recliner, coughing may have strained her neck  8. NECK OVERUSE: Any recent activities that involved turning or twisting the neck?     Denies  9. OTHER SYMPTOMS: Do you have any other symptoms? (e.g., headache, fever, chest pain, difficulty breathing, neck swelling)     Denies other sx  Protocols used: Neck Pain or Stiffness-A-AH

## 2024-06-22 ENCOUNTER — Encounter: Payer: Self-pay | Admitting: Family Medicine

## 2024-06-22 ENCOUNTER — Telehealth: Payer: Self-pay

## 2024-06-22 ENCOUNTER — Ambulatory Visit (INDEPENDENT_AMBULATORY_CARE_PROVIDER_SITE_OTHER): Admitting: Family Medicine

## 2024-06-22 ENCOUNTER — Other Ambulatory Visit (HOSPITAL_COMMUNITY): Payer: Self-pay

## 2024-06-22 VITALS — BP 118/73 | HR 86 | Temp 98.7°F | Resp 16 | Wt 164.6 lb

## 2024-06-22 DIAGNOSIS — M542 Cervicalgia: Secondary | ICD-10-CM

## 2024-06-22 DIAGNOSIS — M436 Torticollis: Secondary | ICD-10-CM

## 2024-06-22 DIAGNOSIS — Z981 Arthrodesis status: Secondary | ICD-10-CM

## 2024-06-22 NOTE — Telephone Encounter (Signed)
 Received approval letter on [employer]ME [employer] thru 08/16/2025 approval letter index

## 2024-06-22 NOTE — Telephone Encounter (Signed)
 Received approval letter [employer] Doreen thru 08/16/2025.approval letter index

## 2024-06-22 NOTE — Progress Notes (Unsigned)
   Acute Office Visit  Introduced to nurse practitioner role and practice setting.  All questions answered.  Discussed provider/patient relationship and expectations.   Subjective:     Patient ID: Gabrielle Pineda, female    DOB: 02/03/1956, 68 y.o.   MRN: [id number]  Chief Complaint  Patient presents with  . Neck Pain    Frequency: x 2 weeks Patient reports unable to turn her neck fully to the right or left. Reports No injury. She just had back surgery about 3 weeks ago. OTC medications: ibuprofen   and Prescription medications: oxycodone .   Discussed the use of AI scribe software for clinical note transcription with the patient, who gave verbal consent to proceed.  History of Present Illness    HPI  ROS      Objective:    BP 118/73 (BP Location: Left Arm, Patient Position: Sitting, Cuff Size: Normal)   Pulse 86   Temp 98.7 F (37.1 C) (Oral)   Resp 16   Wt 164 lb 9.6 oz (74.7 kg)   SpO2 99%   BMI 25.78 kg/m  {Vitals History (Optional):23777}  Physical Exam  No results found for any visits on 06/22/24.      Assessment & Plan:  Assessment and Plan Assessment & Plan      Problem List Items Addressed This Visit   None   No orders of the defined types were placed in this encounter.   No follow-ups on file.  Curtis DELENA Boom, FNP  I, Curtis DELENA Boom, FNP, have reviewed all documentation for this visit. The documentation on 06/22/24 for the exam, diagnosis, procedures, and orders are all accurate and complete.

## 2024-06-22 NOTE — Patient Instructions (Signed)
 Please call neurosurgery as well with your neck concerns so they are aware.

## 2024-06-23 ENCOUNTER — Encounter: Payer: Self-pay | Admitting: Family Medicine

## 2024-06-23 ENCOUNTER — Ambulatory Visit: Payer: Self-pay | Admitting: Family Medicine

## 2024-06-23 LAB — CBC WITH DIFFERENTIAL/PLATELET
Basophils Absolute: 0 x10E3/uL (ref 0.0–0.2)
Basos: 1 %
EOS (ABSOLUTE): 0.2 x10E3/uL (ref 0.0–0.4)
Eos: 3 %
Hematocrit: 27.7 % — ABNORMAL LOW (ref 34.0–46.6)
Hemoglobin: 8.8 g/dL — ABNORMAL LOW (ref 11.1–15.9)
Immature Grans (Abs): 0 x10E3/uL (ref 0.0–0.1)
Immature Granulocytes: 0 %
Lymphocytes Absolute: 1.1 x10E3/uL (ref 0.7–3.1)
Lymphs: 15 %
MCH: 30.8 pg (ref 26.6–33.0)
MCHC: 31.8 g/dL (ref 31.5–35.7)
MCV: 97 fL (ref 79–97)
Monocytes Absolute: 1 x10E3/uL — ABNORMAL HIGH (ref 0.1–0.9)
Monocytes: 14 %
Neutrophils Absolute: 5.1 x10E3/uL (ref 1.4–7.0)
Neutrophils: 67 %
Platelets: 386 x10E3/uL (ref 150–450)
RBC: 2.86 x10E6/uL — ABNORMAL LOW (ref 3.77–5.28)
RDW: 13.6 % (ref 11.7–15.4)
WBC: 7.5 x10E3/uL (ref 3.4–10.8)

## 2024-06-23 LAB — BASIC METABOLIC PANEL WITH GFR
BUN/Creatinine Ratio: 8 — ABNORMAL LOW (ref 12–28)
BUN: 10 mg/dL (ref 8–27)
CO2: 17 mmol/L — ABNORMAL LOW (ref 20–29)
Calcium: 9.3 mg/dL (ref 8.7–10.3)
Chloride: 109 mmol/L — ABNORMAL HIGH (ref 96–106)
Creatinine, Ser: 1.24 mg/dL — ABNORMAL HIGH (ref 0.57–1.00)
Glucose: 79 mg/dL (ref 70–99)
Potassium: 4.3 mmol/L (ref 3.5–5.2)
Sodium: 142 mmol/L (ref 134–144)
eGFR: 48 mL/min/1.73 — ABNORMAL LOW (ref >59)

## 2024-06-23 MED ORDER — TIZANIDINE HCL 4 MG PO TABS
4.0000 mg | ORAL_TABLET | Freq: Four times a day (QID) | ORAL | 0 refills | Status: DC | PRN
Start: 2024-06-23 — End: 2024-06-26

## 2024-06-23 NOTE — Progress Notes (Signed)
 Medication sent.

## 2024-06-23 NOTE — Telephone Encounter (Signed)
 Copied from CRM [id number]. Topic: Clinical - Prescription Issue >> Jun 23, 2024  9:57 AM China J wrote: Reason for CRM: The patient said that her provider was supposed to send in a muscle relaxer yesterday but nothing was sent.  Please call patient back at [phone] as she is aware of the next day turn around time.

## 2024-06-26 ENCOUNTER — Ambulatory Visit: Admitting: Family Medicine

## 2024-06-26 ENCOUNTER — Encounter: Payer: Self-pay | Admitting: Family Medicine

## 2024-06-26 VITALS — BP 130/79 | HR 80 | Temp 98.1°F | Ht 67.0 in | Wt 167.1 lb

## 2024-06-26 DIAGNOSIS — F17211 Nicotine dependence, cigarettes, in remission: Secondary | ICD-10-CM

## 2024-06-26 DIAGNOSIS — I10 Essential (primary) hypertension: Secondary | ICD-10-CM | POA: Diagnosis not present

## 2024-06-26 DIAGNOSIS — K219 Gastro-esophageal reflux disease without esophagitis: Secondary | ICD-10-CM

## 2024-06-26 DIAGNOSIS — N1831 Chronic kidney disease, stage 3a: Secondary | ICD-10-CM

## 2024-06-26 DIAGNOSIS — G894 Chronic pain syndrome: Secondary | ICD-10-CM

## 2024-06-26 DIAGNOSIS — M436 Torticollis: Secondary | ICD-10-CM | POA: Diagnosis not present

## 2024-06-26 DIAGNOSIS — M069 Rheumatoid arthritis, unspecified: Secondary | ICD-10-CM

## 2024-06-26 DIAGNOSIS — Z9981 Dependence on supplemental oxygen: Secondary | ICD-10-CM

## 2024-06-26 DIAGNOSIS — Z79899 Other long term (current) drug therapy: Secondary | ICD-10-CM

## 2024-06-26 DIAGNOSIS — E7800 Pure hypercholesterolemia, unspecified: Secondary | ICD-10-CM

## 2024-06-26 DIAGNOSIS — J9611 Chronic respiratory failure with hypoxia: Secondary | ICD-10-CM

## 2024-06-26 DIAGNOSIS — I471 Supraventricular tachycardia, unspecified: Secondary | ICD-10-CM

## 2024-06-26 DIAGNOSIS — J449 Chronic obstructive pulmonary disease, unspecified: Secondary | ICD-10-CM

## 2024-06-26 MED ORDER — TIZANIDINE HCL 4 MG PO TABS: 4.0000 mg | ORAL_TABLET | Freq: Four times a day (QID) | ORAL | 0 refills | Status: DC | PRN

## 2024-06-26 NOTE — Assessment & Plan Note (Signed)
 Well-controlled on current medications. Follows with pain management; defer to specialist management.

## 2024-06-26 NOTE — Patient Instructions (Addendum)
 Check with rheumatology regarding Shingrix vaccine. Please get at your local pharmacy if they approve.   Contact [facility] for follow up: Elspeth Ozell Jungling [address] [place], [place], [name] Phone: [phone]

## 2024-06-26 NOTE — Assessment & Plan Note (Signed)
 Symptoms well-controlled on current dose of carvedilol . Follows with cardiology; defer to specialist management.

## 2024-06-26 NOTE — Assessment & Plan Note (Signed)
 Currently on leflunomide ; well-controlled. No acute concerns.  Continue to monitor.

## 2024-06-26 NOTE — Progress Notes (Signed)
 Established patient visit   Patient: Gabrielle Pineda   DOB: Feb 17, 1956   68 y.o. Female  MRN: [id number] Visit Date: 06/26/2024  Today's healthcare provider: LAURAINE LOISE BUOY, DO   Chief Complaint  Patient presents with   Medical Management of Chronic Issues    Patient is here today for a chronic follow up.  Expresses no concerns.  States that she still has a cramp in her neck that she was seen last week about.   Subjective    HPI Gabrielle Pineda is a 68 year old female who presents for follow-up of neck pain post-surgery.  She has been experiencing neck pain since her recent surgery. Tizanidine  provides some relief, and she takes it approximately four times a day, with less than a week's supply remaining. She is also performing exercises and physical therapy at home, which she finds beneficial.  She uses a Breztri  inhaler consistently for breathing and reports no issues. Although she has albuterol , she has not needed it recently. She is on leflunomide  for arthritis without any problems and is not taking sulfasalazine due to a previous adverse reaction.  She is on Topamax  for neuropathy and migraines and follows up with a pain clinic. She uses oxycodone  from pain management and is on two liters of oxygen  at night.  She quit smoking in 2022 and does not currently drink alcohol. She reports no chest pain or palpitations and checks her blood pressure at home only when she feels it is necessary.  She has an upcoming appointment with neurosurgery and has been following up with various specialists including a cardiologist, pulmonologist, and rheumatologist. No chest pain, palpitations, or breathing difficulties. No adverse effects from tizanidine  such as tiredness or imbalance.      Medications: Outpatient Medications Prior to Visit  Medication Sig   acetaminophen  (TYLENOL ) 500 MG tablet Take 2 tablets (1,000 mg total) by mouth every 6 (six) hours.   acyclovir  ointment  (ZOVIRAX ) 5 % :Topical Every 3 Hours as needed for lesion   albuterol  (VENTOLIN  HFA) 108 (90 Base) MCG/ACT inhaler Inhale 2 puffs into the lungs every 6 (six) hours as needed for shortness of breath.   atorvastatin  (LIPITOR) 40 MG tablet Take 1 tablet (40 mg total) by mouth daily.   Budeson-Glycopyrrol-Formoterol (BREZTRI  AEROSPHERE) 160-9-4.8 MCG/ACT AERO Inhale 2 puffs into the lungs in the morning and at bedtime.   carvedilol  (COREG ) 12.5 MG tablet TAKE ONE TABLET BY MOUTH AT BREAKFAST AND AT BEDTIME   ezetimibe  (ZETIA ) 10 MG tablet Take 1 tablet (10 mg total) by mouth daily.   ferrous sulfate 325 (65 FE) MG tablet Take 325 mg by mouth daily with breakfast.   fexofenadine (ALLEGRA) 180 MG tablet Take 180 mg by mouth daily.   furosemide  (LASIX ) 20 MG tablet TAKE 1 TABLET BY MOUTH ONCE DAILY   gabapentin  (NEURONTIN ) 400 MG capsule Take 1 capsule (400 mg total) by mouth at bedtime.   ipratropium (ATROVENT ) 0.03 % nasal spray Place 2 sprays into both nostrils 3 (three) times daily as needed for rhinitis.   leflunomide  (ARAVA ) 20 MG tablet Take 20 mg by mouth at bedtime.   losartan  (COZAAR ) 25 MG tablet Take 1 tablet (25 mg total) by mouth daily.   meloxicam  (MOBIC ) 15 MG tablet Take 15 mg by mouth at bedtime.   Misc Natural Products (BRAINSTRONG MEMORY SUPPORT PO) Take 2 capsules by mouth daily.   montelukast  (SINGULAIR ) 10 MG tablet TAKE 1 TABLET BY MOUTH EVERY DAY  AT BEDTIME   Multiple Vitamin (MULTIVITAMIN WITH MINERALS) TABS tablet Take 1 tablet by mouth daily.   Nebulizer MISC Compressor nebulizer to use with nebulized medications as instructed   pantoprazole  (PROTONIX ) 40 MG tablet Take 40 mg by mouth 2 (two) times daily.   polyethylene glycol powder (GLYCOLAX /MIRALAX ) 17 GM/SCOOP powder Take 17 g by mouth daily as needed for mild constipation. Dissolve 1 capful (17g) in 4-8 ounces of liquid and take by mouth daily.   potassium chloride  SA (KLOR-CON  M) 20 MEQ tablet TAKE 1 TABLET BY MOUTH  EVERY MORNING   senna (SENOKOT) 8.6 MG TABS tablet Take 1 tablet (8.6 mg total) by mouth 2 (two) times daily.   topiramate  (TOPAMAX ) 25 MG tablet Take 1 tablet (25 mg total) by mouth 2 (two) times daily.   valACYclovir  (VALTREX ) 1000 MG tablet Take 2 tablets daily for 5 days as needed for cold sores.   venlafaxine  XR (EFFEXOR -XR) 75 MG 24 hr capsule TAKE 2 CAPSULES BY MOUTH DAILY WITH BREAKFAST *DOSE CHANGE*   XIIDRA  5 % SOLN Place 1 drop into both eyes daily as needed (dry eyes).   [DISCONTINUED] mirtazapine  (REMERON ) 15 MG tablet TAKE 1 TABLET BY MOUTH EVERY DAY AT BEDTIME   [DISCONTINUED] oxyCODONE  (OXY IR/ROXICODONE ) 5 MG immediate release tablet Take 1-2 tablets (5-10 mg total) by mouth every 4 (four) hours as needed for moderate pain (pain score 4-6).   [DISCONTINUED] tiZANidine  (ZANAFLEX ) 4 MG tablet Take 1 tablet (4 mg total) by mouth every 6 (six) hours as needed for muscle spasms.   No facility-administered medications prior to visit.        Objective    BP 130/79 (BP Location: Right Arm, Patient Position: Sitting, Cuff Size: Normal)   Pulse 80   Temp 98.1 F (36.7 C) (Oral)   Ht 5' 7 (1.702 m)   Wt 167 lb 1.6 oz (75.8 kg)   SpO2 98%   BMI 26.17 kg/m     Physical Exam Constitutional:      Appearance: Normal appearance.  HENT:     Head: Normocephalic and atraumatic.  Eyes:     General: No scleral icterus.    Extraocular Movements: Extraocular movements intact.     Conjunctiva/sclera: Conjunctivae normal.  Cardiovascular:     Rate and Rhythm: Normal rate and regular rhythm.     Pulses: Normal pulses.     Heart sounds: Normal heart sounds.  Pulmonary:     Effort: Pulmonary effort is normal. No respiratory distress.     Breath sounds: Normal breath sounds.  Musculoskeletal:     Right lower leg: No edema.     Left lower leg: No edema.     Comments: Uses cane at baseline  Skin:    General: Skin is warm and dry.  Neurological:     Mental Status: She is alert  and oriented to person, place, and time. Mental status is at baseline.  Psychiatric:        Mood and Affect: Mood normal.        Behavior: Behavior normal.      Results for orders placed or performed in visit on 06/26/24  Microalbumin / creatinine urine ratio  Result Value Ref Range   Creatinine, Urine 46.5 Not Estab. mg/dL   Microalbumin, Urine 4.5 Not Estab. ug/mL   Microalb/Creat Ratio 10 0 - 29 mg/g creat  Comprehensive metabolic panel with GFR  Result Value Ref Range   Glucose 81 70 - 99 mg/dL   BUN 12  8 - 27 mg/dL   Creatinine, Ser 8.56 (H) 0.57 - 1.00 mg/dL   eGFR 40 (L) >40 fO/fpw/8.26   BUN/Creatinine Ratio 8 (L) 12 - 28   Sodium 137 134 - 144 mmol/L   Potassium 4.2 3.5 - 5.2 mmol/L   Chloride 106 96 - 106 mmol/L   CO2 17 (L) 20 - 29 mmol/L   Calcium  8.8 8.7 - 10.3 mg/dL   Total Protein 6.1 6.0 - 8.5 g/dL   Albumin 3.6 (L) 3.9 - 4.9 g/dL   Globulin, Total 2.5 1.5 - 4.5 g/dL   Bilirubin Total <9.7 0.0 - 1.2 mg/dL   Alkaline Phosphatase 170 (H) 49 - 135 IU/L   AST 19 0 - 40 IU/L   ALT 18 0 - 32 IU/L  Lipid panel  Result Value Ref Range   Cholesterol, Total 141 100 - 199 mg/dL   Triglycerides 871 0 - 149 mg/dL   HDL 37 (L) >60 mg/dL   VLDL Cholesterol Cal 23 5 - 40 mg/dL   LDL Chol Calc (NIH) 81 0 - 99 mg/dL   Chol/HDL Ratio 3.8 0.0 - 4.4 ratio  VITAMIN D  25 Hydroxy (Vit-D Deficiency, Fractures)  Result Value Ref Range   Vit D, 25-Hydroxy 33.6 30.0 - 100.0 ng/mL  Vitamin B12  Result Value Ref Range   Vitamin B-12 1,054 232 - 1,245 pg/mL    Assessment & Plan    Stiffness of neck -     tiZANidine  HCl; Take 1 tablet (4 mg total) by mouth every 6 (six) hours as needed for muscle spasms.  Dispense: 30 tablet; Refill: 0  Essential hypertension  Rheumatoid arthritis involving multiple sites, unspecified whether rheumatoid factor present [facility]) Assessment & Plan: Currently on leflunomide ; well-controlled. No acute concerns.  Continue to monitor.    Chronic pain  syndrome Assessment & Plan: Well-controlled on current medications. Follows with pain management; defer to specialist management.   Chronic kidney disease, stage 3a (HCC) -     Microalbumin / creatinine urine ratio -     Comprehensive metabolic panel with GFR -     VITAMIN D  25 Hydroxy (Vit-D Deficiency, Fractures)  Nicotine  dependence, cigarettes, in remission  Paroxysmal supraventricular tachycardia Assessment & Plan: Symptoms well-controlled on current dose of carvedilol . Follows with cardiology; defer to specialist management.   Chronic obstructive pulmonary disease, unspecified COPD type (HCC) Assessment & Plan: Well-controlled with Breztri  inhaler and PRN albuterol . Continues to use 2 liters of oxygen  at night. - Continue Breztri  inhaler and albuterol  as prescribed. - Continue 2 liters of oxygen  at night.   - Follows with pulmonology; defer to specialist management.   Chronic respiratory failure with hypoxia, on home oxygen  therapy [facility]) Assessment & Plan: Addressed as noted above.   Hypercholesteremia -     Lipid panel  Gastroesophageal reflux disease without esophagitis -     Vitamin B12  High risk medication use -     Vitamin B12      Stiffness of neck Post-surgical torticollis with some relief from tizanidine . Engaging in recommended exercises and physical therapy. - Continue tizanidine  as prescribed. - Continue post-operative exercises and physical therapy. - Follow up with neurosurgery next week.  Essential hypertension Chronic, blood pressure is well-controlled. Monitors blood pressure at home as needed. - Continue carvedilol  and losartan  as prescribed  Nicotine  dependence, cigarettes, in remission Nicotine  dependence in remission since 2022. No current tobacco use. - Continue to abstain from tobacco use.  General Health Maintenance Discussed shingles vaccine and colon cancer  screening. Shingles vaccine is non-live and should be safe. Colon  cancer screening due in December. - Check with rheumatology regarding shingles vaccine. - Schedule colon cancer screening in December.    Return in about 22 weeks (around 11/27/2024) for CPE, Chronic f/u.      I discussed the assessment and treatment plan with the patient  The patient was provided an opportunity to ask questions and all were answered. The patient agreed with the plan and demonstrated an understanding of the instructions.   The patient was advised to call back or seek an in-person evaluation if the symptoms worsen or if the condition fails to improve as anticipated.    LAURAINE LOISE BUOY, DO  [facility] [phone] (phone) [phone] (fax)  [facility]

## 2024-06-26 NOTE — Assessment & Plan Note (Addendum)
 Well-controlled with Breztri  inhaler and PRN albuterol . Continues to use 2 liters of oxygen  at night. - Continue Breztri  inhaler and albuterol  as prescribed. - Continue 2 liters of oxygen  at night.   - Follows with pulmonology; defer to specialist management.

## 2024-06-27 LAB — COMPREHENSIVE METABOLIC PANEL WITH GFR
ALT: 18 IU/L (ref 0–32)
AST: 19 IU/L (ref 0–40)
Albumin: 3.6 g/dL — ABNORMAL LOW (ref 3.9–4.9)
Alkaline Phosphatase: 170 IU/L — ABNORMAL HIGH (ref 49–135)
BUN/Creatinine Ratio: 8 — ABNORMAL LOW (ref 12–28)
BUN: 12 mg/dL (ref 8–27)
Bilirubin Total: <0.2 mg/dL (ref 0.0–1.2)
CO2: 17 mmol/L — ABNORMAL LOW (ref 20–29)
Calcium: 8.8 mg/dL (ref 8.7–10.3)
Chloride: 106 mmol/L (ref 96–106)
Creatinine, Ser: 1.43 mg/dL — ABNORMAL HIGH (ref 0.57–1.00)
Globulin, Total: 2.5 g/dL (ref 1.5–4.5)
Glucose: 81 mg/dL (ref 70–99)
Potassium: 4.2 mmol/L (ref 3.5–5.2)
Sodium: 137 mmol/L (ref 134–144)
Total Protein: 6.1 g/dL (ref 6.0–8.5)
eGFR: 40 mL/min/1.73 — ABNORMAL LOW (ref >59)

## 2024-06-27 LAB — VITAMIN D 25 HYDROXY (VIT D DEFICIENCY, FRACTURES): Vit D, 25-Hydroxy: 33.6 ng/mL (ref 30.0–100.0)

## 2024-06-27 LAB — LIPID PANEL
Chol/HDL Ratio: 3.8 ratio (ref 0.0–4.4)
Cholesterol, Total: 141 mg/dL (ref 100–199)
HDL: 37 mg/dL — ABNORMAL LOW (ref >39)
LDL Chol Calc (NIH): 81 mg/dL (ref 0–99)
Triglycerides: 128 mg/dL (ref 0–149)
VLDL Cholesterol Cal: 23 mg/dL (ref 5–40)

## 2024-06-27 LAB — MICROALBUMIN / CREATININE URINE RATIO
Creatinine, Urine: 46.5 mg/dL
Microalb/Creat Ratio: 10 mg/g{creat} (ref 0–29)
Microalbumin, Urine: 4.5 ug/mL

## 2024-06-27 LAB — VITAMIN B12: Vitamin B-12: 1054 pg/mL (ref 232–1245)

## 2024-06-28 ENCOUNTER — Other Ambulatory Visit: Payer: Self-pay | Admitting: Physician Assistant

## 2024-06-28 ENCOUNTER — Ambulatory Visit: Payer: Self-pay

## 2024-06-28 ENCOUNTER — Other Ambulatory Visit: Payer: Self-pay | Admitting: Psychiatry

## 2024-06-28 ENCOUNTER — Telehealth: Payer: Self-pay | Admitting: Physician Assistant

## 2024-06-28 DIAGNOSIS — F3342 Major depressive disorder, recurrent, in full remission: Secondary | ICD-10-CM

## 2024-06-28 DIAGNOSIS — F5101 Primary insomnia: Secondary | ICD-10-CM

## 2024-06-28 DIAGNOSIS — F411 Generalized anxiety disorder: Secondary | ICD-10-CM

## 2024-06-28 MED ORDER — OXYCODONE HCL 5 MG PO TABS: 5.0000 mg | ORAL_TABLET | Freq: Four times a day (QID) | ORAL | 0 refills | Status: DC | PRN

## 2024-06-28 NOTE — Telephone Encounter (Signed)
 This request is for [facility] location not [facility]

## 2024-06-28 NOTE — Telephone Encounter (Signed)
 FYI Only or Action Required?: FYI only for provider: FYI only .  Patient was last seen in primary care on 06/26/2024 by [name], DO.  Called Nurse Triage reporting Pain.  Symptoms began ongoing and recent post-op back surgery.  Interventions attempted: Prescription medications:  SABRA  Symptoms are: gradually improving.  Triage Disposition: Home Care  Patient/caregiver understands and will follow disposition?: YesCopied from CRM #[id number]. Topic: Clinical - Home Health Verbal Orders >> Jun 28, 2024 12:51 PM Delon DASEN wrote: Caller/Agency: Neva [facility] Callback Number: [phone] Service Requested: Physical Therapy Frequency: n/a Any new concerns about the patient? Yes- patient complaining of neck and back pain level 8 Reason for Disposition  Caused by a twisting, bending, or lifting injury  Answer Assessment - Initial Assessment Questions 1. ONSET: When did the pain begin? (e.g., minutes, hours, days)     today 2. LOCATION: Where does it hurt? (upper, mid or lower back)     Neck/back 3. SEVERITY: How bad is the pain?  (e.g., Scale 1-10; mild, moderate, or severe)     8/10 4. PATTERN: Is the pain constant? (e.g., yes, no; constant, intermittent)      constant 5. RADIATION: Does the pain shoot into your legs or somewhere else?     Neck and shoulder 6. CAUSE:  What do you think is causing the back pain?      na 7. BACK OVERUSE:  Any recent lifting of heavy objects, strenuous work or exercise?     na 8. MEDICINES: What have you taken so far for the pain? (e.g., nothing, acetaminophen , NSAIDS)     Pain medication and muscle relaxer  9. NEUROLOGIC SYMPTOMS: Do you have any weakness, numbness, or problems with bowel/bladder control?     no 10. OTHER SYMPTOMS: Do you have any other symptoms? (e.g., fever, abdomen pain, burning with urination, blood in urine)       no 11. PREGNANCY: Is there any chance you are pregnant? When was your last menstrual  period?       Na  PT calling to inform of pt pain at present time.  Pt also present.  Pt has taken prescribed pain medication and stated pain is trending down.  Also was informed that pt just recently had back surgery related to back pain.  Protocols used: Back Pain-A-AH

## 2024-06-28 NOTE — Telephone Encounter (Signed)
 Patient is calling to request a refill of Oxycodone  be sent to [facility] on [address].

## 2024-06-29 ENCOUNTER — Encounter: Payer: Self-pay | Admitting: Neurosurgery

## 2024-06-29 DIAGNOSIS — M5412 Radiculopathy, cervical region: Secondary | ICD-10-CM

## 2024-06-29 DIAGNOSIS — M4316 Spondylolisthesis, lumbar region: Secondary | ICD-10-CM

## 2024-06-29 DIAGNOSIS — M542 Cervicalgia: Secondary | ICD-10-CM

## 2024-06-29 DIAGNOSIS — M48062 Spinal stenosis, lumbar region with neurogenic claudication: Secondary | ICD-10-CM

## 2024-06-29 NOTE — Telephone Encounter (Signed)
 Called patient to advise but voicemail was not set up, sent mychart message advising of medication refill

## 2024-06-29 NOTE — Telephone Encounter (Signed)
 Patient notified and expressed understanding that her medication had been sent.

## 2024-06-30 ENCOUNTER — Other Ambulatory Visit: Payer: Self-pay

## 2024-06-30 DIAGNOSIS — J441 Chronic obstructive pulmonary disease with (acute) exacerbation: Secondary | ICD-10-CM

## 2024-07-03 ENCOUNTER — Ambulatory Visit: Admitting: Neurosurgery

## 2024-07-03 ENCOUNTER — Encounter: Payer: Self-pay | Admitting: Neurosurgery

## 2024-07-03 ENCOUNTER — Ambulatory Visit (INDEPENDENT_AMBULATORY_CARE_PROVIDER_SITE_OTHER)

## 2024-07-03 ENCOUNTER — Ambulatory Visit: Payer: Self-pay | Admitting: Family Medicine

## 2024-07-03 VITALS — BP 122/78 | Temp 98.3°F

## 2024-07-03 DIAGNOSIS — G959 Disease of spinal cord, unspecified: Secondary | ICD-10-CM

## 2024-07-03 DIAGNOSIS — M4802 Spinal stenosis, cervical region: Secondary | ICD-10-CM

## 2024-07-03 DIAGNOSIS — E441 Mild protein-calorie malnutrition: Secondary | ICD-10-CM

## 2024-07-03 DIAGNOSIS — L97529 Non-pressure chronic ulcer of other part of left foot with unspecified severity: Secondary | ICD-10-CM | POA: Insufficient documentation

## 2024-07-03 DIAGNOSIS — R748 Abnormal levels of other serum enzymes: Secondary | ICD-10-CM

## 2024-07-03 DIAGNOSIS — M4316 Spondylolisthesis, lumbar region: Secondary | ICD-10-CM | POA: Diagnosis not present

## 2024-07-03 DIAGNOSIS — M4312 Spondylolisthesis, cervical region: Secondary | ICD-10-CM | POA: Diagnosis not present

## 2024-07-03 DIAGNOSIS — M48062 Spinal stenosis, lumbar region with neurogenic claudication: Secondary | ICD-10-CM

## 2024-07-03 DIAGNOSIS — M5030 Other cervical disc degeneration, unspecified cervical region: Secondary | ICD-10-CM | POA: Diagnosis not present

## 2024-07-03 DIAGNOSIS — M8580 Other specified disorders of bone density and structure, unspecified site: Secondary | ICD-10-CM | POA: Insufficient documentation

## 2024-07-03 DIAGNOSIS — M5412 Radiculopathy, cervical region: Secondary | ICD-10-CM

## 2024-07-03 DIAGNOSIS — M542 Cervicalgia: Secondary | ICD-10-CM

## 2024-07-03 DIAGNOSIS — T8189XA Other complications of procedures, not elsewhere classified, initial encounter: Secondary | ICD-10-CM

## 2024-07-03 NOTE — Progress Notes (Signed)
   REFERRING PHYSICIAN:  Donzella Lauraine LOISE Rosalea [address] [place],  [name]  DOS: 05/23/24, L4-5 MAS TLIF, 5/20 SCSD battery exhange  Discussed the use of AI scribe software for clinical note transcription with the patient, who gave verbal consent to proceed.  History of Present Illness Gabrielle Pineda is a 68 year old female with a history of neck surgeries who presents with worsening neck pain. Gabrielle Pineda is approximately 6 weeks status post L4-5 MAS TLIF.   She experiences significant neck pain primarily at the base of her neck, with a history of three neck surgeries, the last in 2012. The pain has intensified recently, making neck movement difficult and extending to her shoulders without radiating down her arms or back. She uses ice packs for relief. There is no shooting pain down her spine or into her head. She has difficulty with fine motor tasks such as tying her shoes, but no issues with using utensils or dropping objects.   PHYSICAL EXAMINATION:  General: Patient is well developed, well nourished, calm, collected, and in no apparent distress.   NEUROLOGICAL:  General: In no acute distress.   Awake, alert, oriented to person, place, and time.  Pupils equal round and reactive to light.  Facial tone is symmetric.   Patient wheezing on examination.   Strength:  Full strength in uppers bilaterally             Side Iliopsoas Quads Hamstring PF DF EHL  R 5 5 5 5 4 4   L 5 5 5 5 5 5    Incisions c/d/i  Tromner positive on left, and right. 3+ in uppers. 2+ in lowers.   ROS (Neurologic):  Negative except as noted above  IMAGING: Chest x-ray to be completed in clinic today.  Assessment and Plan Assessment & Plan Cervical spondylolisthesis with myelopathy and mechanical neck pain Increased neck pain with movement, localized to neck and shoulders. X-rays suggest possible C2-C3 slipping, potentially worsening with flexion. Positive Hoffman's sign  bilaterally and hyperreflexia in arms indicate possible cervical myelopathy. No radicular symptoms or significant motor deficits. Differential includes worsening cervical spondylolisthesis and myelopathy. - Ordered noncontrast cervical spine CT scan to assess fusion and joint status. - Await radiologist's interpretation of x-rays to determine if spinal slipping is present. - Will discuss potential surgical options if slipping is confirmed, considering her preference to avoid surgery.  Delayed wound healing Wounds with raw areas and redness, healing slowly. No signs of infection. Healing appears by second intention. Skin lacks elasticity, possibly due to nutritional deficiencies, reported low food intake - Offered Referral to nutritionist for dietary assessment and optimization to improve wound healing.  Protein-calorie malnutrition, mild Mild protein-calorie malnutrition suspected due to slow wound healing and lack of skin elasticity. Current dietary intake may be insufficient in protein. - Referred to nutritionist for dietary assessment and optimization to address protein-calorie malnutrition.  Doing well from lumbar spine surgery, 6 weeks out. Improved pain and overall function.   Penne LELON Sharps MD Department of neurosurgery

## 2024-07-03 NOTE — Assessment & Plan Note (Signed)
 Addressed as noted above.

## 2024-07-05 ENCOUNTER — Ambulatory Visit

## 2024-07-07 ENCOUNTER — Telehealth: Payer: Self-pay | Admitting: Physician Assistant

## 2024-07-07 ENCOUNTER — Ambulatory Visit
Admission: RE | Admit: 2024-07-07 | Discharge: 2024-07-07 | Disposition: A | Discharge status: Home / Self Care | Source: Ambulatory Visit | Attending: Acute Care | Admitting: Acute Care

## 2024-07-07 ENCOUNTER — Other Ambulatory Visit: Payer: Self-pay | Admitting: Physician Assistant

## 2024-07-07 ENCOUNTER — Ambulatory Visit
Admission: RE | Admit: 2024-07-07 | Discharge: 2024-07-07 | Disposition: A | Discharge status: Home / Self Care | Source: Ambulatory Visit | Attending: Neurosurgery | Admitting: Neurosurgery

## 2024-07-07 DIAGNOSIS — M5030 Other cervical disc degeneration, unspecified cervical region: Secondary | ICD-10-CM

## 2024-07-07 DIAGNOSIS — M4802 Spinal stenosis, cervical region: Secondary | ICD-10-CM | POA: Insufficient documentation

## 2024-07-07 DIAGNOSIS — Z122 Encounter for screening for malignant neoplasm of respiratory organs: Secondary | ICD-10-CM | POA: Insufficient documentation

## 2024-07-07 DIAGNOSIS — Z87891 Personal history of nicotine dependence: Secondary | ICD-10-CM | POA: Insufficient documentation

## 2024-07-07 MED ORDER — OXYCODONE HCL 5 MG PO TABS: 5.0000 mg | ORAL_TABLET | Freq: Three times a day (TID) | ORAL | 0 refills | Status: DC | PRN

## 2024-07-07 NOTE — Telephone Encounter (Signed)
 Patient called to request a refill of Oxycodone  be sent to [facility] on [address].

## 2024-07-10 ENCOUNTER — Other Ambulatory Visit: Payer: Self-pay | Admitting: Acute Care

## 2024-07-10 DIAGNOSIS — Z122 Encounter for screening for malignant neoplasm of respiratory organs: Secondary | ICD-10-CM

## 2024-07-10 DIAGNOSIS — Z87891 Personal history of nicotine dependence: Secondary | ICD-10-CM

## 2024-07-17 ENCOUNTER — Ambulatory Visit: Payer: Self-pay | Admitting: Neurosurgery

## 2024-07-18 ENCOUNTER — Ambulatory Visit: Attending: Nurse Practitioner | Admitting: Nurse Practitioner

## 2024-07-18 ENCOUNTER — Encounter: Payer: Self-pay | Admitting: Nurse Practitioner

## 2024-07-18 VITALS — BP 117/69 | HR 91 | Temp 97.3°F | Resp 18 | Ht 67.0 in | Wt 168.0 lb

## 2024-07-18 DIAGNOSIS — M48061 Spinal stenosis, lumbar region without neurogenic claudication: Secondary | ICD-10-CM | POA: Insufficient documentation

## 2024-07-18 DIAGNOSIS — M5416 Radiculopathy, lumbar region: Secondary | ICD-10-CM | POA: Diagnosis not present

## 2024-07-18 DIAGNOSIS — M47812 Spondylosis without myelopathy or radiculopathy, cervical region: Secondary | ICD-10-CM | POA: Insufficient documentation

## 2024-07-18 DIAGNOSIS — G894 Chronic pain syndrome: Secondary | ICD-10-CM | POA: Diagnosis present

## 2024-07-18 DIAGNOSIS — Z79899 Other long term (current) drug therapy: Secondary | ICD-10-CM | POA: Insufficient documentation

## 2024-07-18 MED ORDER — OXYCODONE-ACETAMINOPHEN 5-325 MG PO TABS: 1.0000 | ORAL_TABLET | Freq: Two times a day (BID) | ORAL | 0 refills | Status: AC | PRN

## 2024-07-18 NOTE — Progress Notes (Signed)
 Nursing Pain Medication Assessment:  Safety precautions to be maintained throughout the outpatient stay will include: orient to surroundings, keep bed in low position, maintain call bell within reach at all times, provide assistance with transfer out of bed and ambulation.  Medication Inspection Compliance: Gabrielle Pineda did not comply with our request to bring her pills to be counted. She was reminded that bringing the medication bottles, even when empty, is a requirement.  Medication: None brought in. Pill/Patch Count: None available to be counted. Bottle Appearance: No container available. Did not bring bottle(s) to appointment. Filled Date: N/A Last Medication intake:  Today  Patient reminded to bring medication to each appointment

## 2024-07-18 NOTE — Progress Notes (Signed)
 PROVIDER NOTE: Interpretation of information contained herein should be left to medically-trained personnel. Specific patient instructions are provided elsewhere under Patient Instructions section of medical record. This document was created in part using AI and STT-dictation technology, any transcriptional errors that may result from this process are unintentional.  Patient: Gabrielle Pineda  Service: E/M   PCP: [name], DO  DOB: [birth year]  DOS: 07/18/2024  Provider: Emmy MARLA Blanch, NP  MRN: [id number]  Delivery: Face-to-face  Specialty: Interventional Pain Management  Type: Established Patient  Setting: Ambulatory outpatient facility  Specialty designation: 09  Referring Prov.: [name], DO  Location: Outpatient office facility       History of present illness (HPI) Gabrielle Pineda, a 68 y.o. year old female, is here today because of her Lumbar radiculopathy [M54.16]. Gabrielle Pineda primary complain today is Back Pain  Pertinent problems: Ms. Beg has Arthritis, degenerative; Arthritis or polyarthritis, rheumatoid (HCC); Localized osteoarthrosis, hand; Closed compression fracture of L5 vertebra (HCC); Cervical radiculopathy; Neuropathy; Spinal stenosis, lumbar region, with neurogenic claudication; Spondylosis of cervical region without myelopathy or radiculopathy; DDD (degenerative disc disease), cervical; Cervicalgia; Cervical fusion syndrome; Chronic pain syndrome; Postlaminectomy syndrome, lumbar region; Chronic radicular lumbar pain; Status post total hip replacement, right; Carpal tunnel syndrome; Spondylolisthesis at L4-L5 level; Spondylolisthesis of lumbar region; Lumbar foraminal stenosis; and Medication management on their pertinent problem list.  Pain Assessment: Severity of Chronic pain is reported as a 7 /10. Location: Back Lower/Right Shoulder, hips. Onset: More than a month ago. Quality: Sharp. Timing: Constant. Modifying factor(s): Pain medication. Vitals:   height is 5' 7 (1.702 m) and weight is 168 lb (76.2 kg). Her temporal temperature is 97.3 F (36.3 C) (abnormal). Her blood pressure is 117/69 and her pulse is 91. Her respiration is 18 and oxygen  saturation is 98%.  BMI: Estimated body mass index is 26.31 kg/m as calculated from the following:   Height as of this encounter: 5' 7 (1.702 m).   Weight as of this encounter: 168 lb (76.2 kg).  Last encounter: 04/25/2024. Last procedure: Visit date not found.  Reason for encounter: medication management. No change in medical history since last visit.  Patient's pain is at baseline.  Patient continues multimodal pain regimen as prescribed.  States that it provides pain relief and improvement in functional status.   Discussed the use of AI scribe software for clinical note transcription with the patient, who gave verbal consent to proceed.  History of Present Illness   Gabrielle Pineda is a 68 year old female who presents with pain management following recent spine surgery. She underwent spine surgery on her lower back last month and is still in the recovery phase. She has completed six weeks of physical therapy. She is currently taking Percocet 5-325 mg twice daily for pain management and reports no side effects or adverse reactions from the medication. She previously used Oxycontin  but has since discontinued it.     Pharmacotherapy Assessment   Analgesic: Oxycodone -acetaminophen  (Percocet) 5-325 mg tablet every 12 hours as needed for pain.  MME=15 Monitoring: [place] PMP: PDMP reviewed during this encounter.       Pharmacotherapy: No side-effects or adverse reactions reported. Compliance: No problems identified. Effectiveness: Clinically acceptable.  Gabrielle Pineda, [place]  07/18/2024 11:17 AM  Sign when Signing Visit Nursing Pain Medication Assessment:  Safety precautions to be maintained throughout the outpatient stay will include: orient to surroundings, keep bed in low position, maintain call bell  within reach at  all times, provide assistance with transfer out of bed and ambulation.  Medication Inspection Compliance: Gabrielle Pineda did not comply with our request to bring her pills to be counted. She was reminded that bringing the medication bottles, even when empty, is a requirement.  Medication: None brought in. Pill/Patch Count: None available to be counted. Bottle Appearance: No container available. Did not bring bottle(s) to appointment. Filled Date: N/A Last Medication intake:  Today  Patient reminded to bring medication to each appointment     UDS:  Summary  Date Value Ref Range Status  10/26/2023 FINAL  Final    Comment:    ==================================================================== [employer] [facility] (MW) ==================================================================== Test                             Result       Flag       Units  Drug Present and Declared for Prescription Verification   Oxycodone                       1313         EXPECTED   ng/mg creat   Oxymorphone                    470          EXPECTED   ng/mg creat   Noroxycodone                   2018         EXPECTED   ng/mg creat   Noroxymorphone                 218          EXPECTED   ng/mg creat    Sources of oxycodone  are scheduled prescription medications.    Oxymorphone, noroxycodone, and noroxymorphone are expected    metabolites of oxycodone . Oxymorphone is also available as a    scheduled prescription medication.  ==================================================================== Test                      Result    Flag   Units      Ref Range   Creatinine              93               mg/dL      >=79 ==================================================================== Declared Medications:  The flagging and interpretation on this report are based on the  following declared medications.  Unexpected results may arise from  inaccuracies in the declared medications.   **Note: The  testing scope of this panel includes these medications:   Oxycodone    **Note: The testing scope of this panel does not include the  following reported medications:   Acetaminophen   Acyclovir  (Zovirax )  Albuterol  (Proventil  HFA)  Atogepant  Atorvastatin  (Lipitor)  Azelastine  (Astelin )  Budesonide  (Breztri  Aerosphere)  Buspirone  (Buspar )  Carvedilol  (Coreg )  Eye Drops  Ezetimibe  (Zetia )  Fexofenadine (Allegra)  Fluticasone  (Flonase )  Formoterol  (Breztri  Aerosphere)  Furosemide  (Lasix )  Gabapentin  (Neurontin )  Glycopyrrolate  (Breztri  Aerosphere)  Hydroxyzine  (Atarax )  Ipratropium (Atrovent )  Leflunomide  (Arava )  Losartan  (Cozaar )  Meloxicam  (Mobic )  Mirtazapine  (Remeron )  Montelukast  (Singulair )  Multivitamin  Omeprazole  (Prilosec)  Potassium (Klor-Con )  Topiramate  (Topamax )  Valacyclovir  (Valtrex )  Venlafaxine  (Effexor ) ==================================================================== For clinical consultation, please call [phone]. ====================================================================     No results found for: CBDTHCR No  results found for: D8THCCBX No results found for: D9THCCBX  ROS  Constitutional: Denies any fever or chills Gastrointestinal: No reported hemesis, hematochezia, vomiting, or acute GI distress Musculoskeletal: Low back pain Neurological: No reported episodes of acute onset apraxia, aphasia, dysarthria, agnosia, amnesia, paralysis, loss of coordination, or loss of consciousness  Medication Review  [employer] , Misc Natural Products, Nebulizer, acetaminophen , acyclovir  ointment, albuterol , atorvastatin , budesonide -glycopyrrolate -formoterol , carvedilol , ezetimibe , ferrous sulfate , fexofenadine, furosemide , gabapentin , ipratropium, leflunomide , losartan , meloxicam , mirtazapine , montelukast , multivitamin with minerals, oxyCODONE , oxyCODONE -acetaminophen , pantoprazole , polyethylene glycol powder, potassium chloride  SA,  senna, tiZANidine , topiramate , valACYclovir , and venlafaxine  XR  History Review  Allergy: Gabrielle Pineda is allergic to sulfasalazine and plaquenil [hydroxychloroquine]. Drug: Gabrielle Pineda  reports no history of drug use. Alcohol:  reports no history of alcohol use. Tobacco:  reports that she quit smoking about 3 years ago. Her smoking use included cigarettes. She started smoking about 48 years ago. She has a 90 pack-year smoking history. She has never used smokeless tobacco. Social: Gabrielle Pineda  reports that she quit smoking about 3 years ago. Her smoking use included cigarettes. She started smoking about 48 years ago. She has a 90 pack-year smoking history. She has never used smokeless tobacco. She reports that she does not drink alcohol and does not use drugs. Medical:  has a past medical history of Alcohol abuse, Allergy, Anemia, Anxiety, Arthritis, Cataract, Cervicalgia, Cirrhosis (HCC) (1994), COPD (chronic obstructive pulmonary disease) (HCC), Depression, Difficult intubation, Dyspnea, GERD (gastroesophageal reflux disease), Headache, Heart murmur, Hypertension, Neuromuscular disorder (HCC) (2017), Neuropathy, Other and unspecified hyperlipidemia, Oxygen  deficiency, Status post insertion of spinal cord stimulator, Substance abuse (HCC) (1994), and Tachycardia. Surgical: Gabrielle Pineda  has a past surgical history that includes neck disc surgery; Breast enhancement surgery (1981); Esophagogastroduodenoscopy; Tonsillectomy and adenoidectomy (1973); Carpal tunnel release (11/11/2011); Carpal tunnel release (2013); Rotator cuff repair (06/16/2016); Rotator cuff repair; Colonoscopy with propofol  (N/A, 11/01/2017); Augmentation mammaplasty (Bilateral, 1982); Anterior cervical decomp/discectomy fusion (2012, 2015, 2018); Lumbar laminectomy/decompression microdiscectomy (N/A, 08/15/2019); Spinal cord stimulator insertion (N/A, 03/17/2021); Total hip arthroplasty (Right, 04/15/2021); Joint replacement  (2022); Spine surgery (2020); Colonoscopy with propofol  (N/A, 08/09/2023); Esophagogastroduodenoscopy (egd) with propofol  (N/A, 08/09/2023); polypectomy (08/09/2023); Spinal cord stimulator battery exchange (Right, 01/04/2024); Transforaminal lumbar interbody fusion (tlif) with pedicle screw fixation 1 level (N/A, 05/23/2024); and Application of intraoperative CT scan (N/A, 05/23/2024). Family: family history includes Alcohol abuse in her father, mother, sister, and sister; Anxiety disorder in her sister; Arthritis in her mother; Bipolar disorder in her mother; Cancer in her father; Depression in her sister; Drug abuse in her sister; Early death in her mother; Pancreatic cancer in her father; Suicidality in her mother.  Laboratory Chemistry Profile   Renal Lab Results  Component Value Date   BUN 12 06/26/2024   CREATININE 1.43 (H) 06/26/2024   BCR 8 (L) 06/26/2024   GFRAA 72 05/06/2020   GFRNONAA 45 (L) 05/15/2024    Hepatic Lab Results  Component Value Date   AST 19 06/26/2024   ALT 18 06/26/2024   ALBUMIN 3.6 (L) 06/26/2024   ALKPHOS 170 (H) 06/26/2024   LIPASE 63 03/23/2018    Electrolytes Lab Results  Component Value Date   NA 137 06/26/2024   K 4.2 06/26/2024   CL 106 06/26/2024   CALCIUM  8.8 06/26/2024   PHOS 3.9 11/26/2023    Bone Lab Results  Component Value Date   VD25OH 33.6 06/26/2024    Inflammation (CRP: Acute Phase) (ESR: Chronic Phase) No results found for: CRP, ESRSEDRATE, LATICACIDVEN  Note: Above Lab results reviewed.  Recent Imaging Review  CT CHEST LUNG CA SCREEN LOW DOSE W/O CM CLINICAL DATA:  Former 96 pack-year smoker, quit 2022.  EXAM: CT CHEST WITHOUT CONTRAST LOW-DOSE FOR LUNG CANCER SCREENING  TECHNIQUE: Multidetector CT imaging of the chest was performed following the standard protocol without IV contrast.  RADIATION DOSE REDUCTION: This exam was performed according to the departmental dose-optimization program which includes  automated exposure control, adjustment of the mA and/or kV according to patient size and/or use of iterative reconstruction technique.  COMPARISON:  06/29/2022.  FINDINGS: Cardiovascular: Atherosclerotic calcification of the aorta, aortic valve and coronary arteries. Enlarged pulmonic trunk. Heart is at no pericardial effusion.  Mediastinum/Nodes: No pathologically enlarged mediastinal or axillary lymph nodes. Hilar regions are difficult to definitively evaluate without IV contrast. Esophagus is grossly unremarkable.  Lungs/Pleura: Centrilobular and paraseptal emphysema. Scattered subsegmental volume loss. 4.7 mm anterior left upper lobe nodule. No suspicious pulmonary nodules. No pleural fluid. Minimal debris in the airway.  Upper Abdomen: Visualized portions of the liver, gallbladder, adrenal glands, kidneys, spleen, pancreas, stomach and bowel are grossly unremarkable. No upper abdominal adenopathy.  Musculoskeletal: Spinal stimulator wires. Degenerative changes in the spine. Osteopenia. Postoperative changes in the cervical spine.  IMPRESSION: 1. Lung-RADS 2, benign appearance or behavior. Continue annual screening with low-dose chest CT without contrast in 12 months. 2.  Emphysema (ICD10-J43.9). 3. Aortic atherosclerosis (ICD10-I70.0). Coronary artery calcification.  Electronically Signed   By: Newell Eke M.D.   On: 07/10/2024 10:14 Note: Reviewed        Physical Exam  Vitals: BP 117/69 (BP Location: Right Arm, Patient Position: Sitting, Cuff Size: Normal)   Pulse 91   Temp (!) 97.3 F (36.3 C) (Temporal)   Resp 18   Ht 5' 7 (1.702 m)   Wt 168 lb (76.2 kg)   SpO2 98%   BMI 26.31 kg/m  BMI: Estimated body mass index is 26.31 kg/m as calculated from the following:   Height as of this encounter: 5' 7 (1.702 m).   Weight as of this encounter: 168 lb (76.2 kg). Ideal: Ideal body weight: 61.6 kg (135 lb 12.9 oz) Adjusted ideal body weight: 67.4 kg (148 lb  10.9 oz) General appearance: Well nourished, well developed, and well hydrated. In no apparent acute distress Mental status: Alert, oriented x 3 (person, place, & time)       Respiratory: No evidence of acute respiratory distress Eyes: PERLA  Musculoskeletal: +LBP Assessment   Diagnosis Status  1. Lumbar radiculopathy (R>L)   2. Chronic pain syndrome   3. Medication management   4. Spondylosis of cervical region without myelopathy or radiculopathy   5. Lumbar foraminal stenosis    Controlled Controlled Controlled   Updated Problems: Problem  Lumbar Foraminal Stenosis  Medication Management    Plan of Care  Problem-specific:  Assessment and Plan    Chronic lumbar radiculopathy Post-operative status following lower back spine surgery last month. Recovery ongoing with improved ambulation. Completed six weeks of physical therapy. - Continue follow-up with Dr. Claudene for Post-op follow up management.  Long-term opioid therapy for chronic pain Currently on Percocet 5-325 mg twice daily with good tolerance and no adverse reactions. Transitioned from Oxycontin  to Percocet successfully. - Prescribed Percocet 5-325 mg for three months. - Sent prescription to [facility] with a note to fill one day early on December 30th due to pharmacy closure on December 31st.   Medication management: Patient's pain is controlled with oxycodone , will continue on  current medication regimen.  Prescribing drug monitoring (PDMP) reviewed, findings consistent with the use of prescribed medication and no evidence of narcotic misuse or abuse.  Urine drug screening (UDS) up to date.  No adverse reaction or side effects reported to medication.  The patient is recovering from L4-5 MIS TLIF with improved pain relief and functional mobility.  Schedule follow-up in 90 days for medication management.      Gabrielle Pineda has a current medication list which includes the following long-term medication(s):  albuterol , atorvastatin , carvedilol , ezetimibe , ferrous sulfate , fexofenadine, furosemide , gabapentin , ipratropium, losartan , mirtazapine , montelukast , potassium chloride  sa, topiramate , and venlafaxine  xr.  Pharmacotherapy (Medications Ordered): Meds ordered this encounter  Medications   oxyCODONE -acetaminophen  (PERCOCET) 5-325 MG tablet    Sig: Take 1 tablet by mouth every 12 (twelve) hours as needed for severe pain (pain score 7-10). Must last 30 days.    Dispense:  60 tablet    Refill:  0    May fill one day early on 08/15/2024.   oxyCODONE -acetaminophen  (PERCOCET) 5-325 MG tablet    Sig: Take 1 tablet by mouth every 12 (twelve) hours as needed for severe pain (pain score 7-10). Must last 30 days.    Dispense:  60 tablet    Refill:  0   oxyCODONE -acetaminophen  (PERCOCET) 5-325 MG tablet    Sig: Take 1 tablet by mouth every 12 (twelve) hours as needed for severe pain (pain score 7-10). Must last 30 days.    Dispense:  60 tablet    Refill:  0   Orders:  No orders of the defined types were placed in this encounter.       Return in about 3 months (around 10/16/2024) for (F2F), (MM), Emmy Blanch NP.    Recent Visits Date Type Provider Dept  04/25/24 Office Visit Voula Waln K, NP [facility]  Showing recent visits within past 90 days and meeting all other requirements Today's Visits Date Type Provider Dept  07/18/24 Office Visit Wilhemenia Camba K, NP [facility]  Showing today's visits and meeting all other requirements Future Appointments No visits were found meeting these conditions. Showing future appointments within next 90 days and meeting all other requirements  I discussed the assessment and treatment plan with the patient. The patient was provided an opportunity to ask questions and all were answered. The patient agreed with the plan and demonstrated an understanding of the instructions.  Patient advised to call back or seek an in-person evaluation if  the symptoms or condition worsens.  I personally spent a total of 30 minutes in the care of the patient today including preparing to see the patient, getting/reviewing separately obtained history, performing a medically appropriate exam/evaluation, counseling and educating, placing orders, referring and communicating with other health care professionals, documenting clinical information in the EHR, independently interpreting results, communicating results, and coordinating care.   Note by: Luisdavid Hamblin K Idalis Hoelting, NP (TTS and AI technology used. I apologize for any typographical errors that were not detected and corrected.) Date: 07/18/2024; Time: 12:38 PM

## 2024-07-19 ENCOUNTER — Other Ambulatory Visit: Payer: Self-pay | Admitting: Physician Assistant

## 2024-07-19 ENCOUNTER — Telehealth: Payer: Self-pay | Admitting: Neurosurgery

## 2024-07-19 DIAGNOSIS — M436 Torticollis: Secondary | ICD-10-CM

## 2024-07-19 DIAGNOSIS — M4802 Spinal stenosis, cervical region: Secondary | ICD-10-CM

## 2024-07-19 MED ORDER — TIZANIDINE HCL 4 MG PO TABS: 4.0000 mg | ORAL_TABLET | Freq: Four times a day (QID) | ORAL | 0 refills | Status: DC | PRN

## 2024-07-19 MED ORDER — OXYCODONE HCL 5 MG PO TABS: 5.0000 mg | ORAL_TABLET | Freq: Three times a day (TID) | ORAL | 0 refills | Status: DC | PRN

## 2024-07-19 NOTE — Telephone Encounter (Signed)
 Date:07/19/24 Provider [facility] Requesting: Tizanidine  4mg  & Oxycodone  5mg  Pharmacy [facility] on [address] Patient contact [phone]

## 2024-07-28 ENCOUNTER — Telehealth: Payer: Self-pay

## 2024-07-28 NOTE — Progress Notes (Signed)
 Pharmacy Quality Measure Review  This patient is appearing on a report for being at risk of failing the adherence measure for hypertension (ACEi/ARB) medications this calendar year.   Medication: losartan  Last fill date: 07/28/24 for 30 day supply  Insurance report was not up to date. No action needed at this time.   Anneka Studer E. Marsh, PharmD, CPP Clinical Pharmacist [facility] [phone]

## 2024-08-02 ENCOUNTER — Telehealth: Payer: Self-pay | Admitting: Neurosurgery

## 2024-08-02 NOTE — Telephone Encounter (Signed)
 Per Chart Dr Tobie sent in 3 RXs of Percocet 07/19/24 with 3 different RXs for 3 months. Patient states Percocet is not helping as much as Oxycodone  5 mg, I advised patient to follow up with Dr Anthony office since they started prescribing this narcotic as they would need to make adjustments and cancel RXs if needed.

## 2024-08-02 NOTE — Telephone Encounter (Signed)
 Patient called to request a refill of Oxycodone  be sent to [facility] on [address]. She states that she has not heard from [facility] but sees Dr. Marcelino for pain management.

## 2024-08-08 ENCOUNTER — Other Ambulatory Visit: Payer: Self-pay | Admitting: Physician Assistant

## 2024-08-08 DIAGNOSIS — Z981 Arthrodesis status: Secondary | ICD-10-CM

## 2024-08-14 NOTE — Telephone Encounter (Signed)
 Correcting previus note: pt should be on Breztri  not farxiga, approval letter index.

## 2024-08-15 ENCOUNTER — Encounter: Admitting: Physician Assistant

## 2024-08-16 ENCOUNTER — Encounter: Payer: Self-pay | Admitting: Psychiatry

## 2024-08-16 ENCOUNTER — Telehealth: Admitting: Psychiatry

## 2024-08-16 DIAGNOSIS — F3342 Major depressive disorder, recurrent, in full remission: Secondary | ICD-10-CM | POA: Diagnosis not present

## 2024-08-16 DIAGNOSIS — F1021 Alcohol dependence, in remission: Secondary | ICD-10-CM

## 2024-08-16 DIAGNOSIS — F411 Generalized anxiety disorder: Secondary | ICD-10-CM

## 2024-08-16 DIAGNOSIS — F5101 Primary insomnia: Secondary | ICD-10-CM

## 2024-08-16 NOTE — Progress Notes (Signed)
 Virtual Visit via Video Note  I connected with Gabrielle Pineda on 08/16/2024 at 12:30 PM EST by a video enabled telemedicine application and verified that I am speaking with the correct person using two identifiers.  Location Provider Location : [facility] Patient Location : Home ( sister's)  Participants: Patient , Provider  I discussed the limitations of evaluation and management by telemedicine and the availability of in person appointments. The patient expressed understanding and agreed to proceed.   I discussed the assessment and treatment plan with the patient. The patient was provided an opportunity to ask questions and all were answered. The patient agreed with the plan and demonstrated an understanding of the instructions.   The patient was advised to call back or seek an in-person evaluation if the symptoms worsen or if the condition fails to improve as anticipated.  [facility] MD OP Progress Note  08/16/2024 12:46 PM Brady SHANDEL BUSIC  MRN:  [id number]  Chief Complaint:  Chief Complaint  Patient presents with   Follow-up   Anxiety   Depression   Medication Refill   Discussed the use of AI scribe software for clinical note transcription with the patient, who gave verbal consent to proceed.  History of Present Illness Gabrielle Pineda is a 68 year old Caucasian female on disability, lives in [place], has a history of MDD, GAD, insomnia, alcoholism in remission, history of back pain, history of spinal cord stimulator, paroxysmal ventricular tachycardia, hypercholesterolemia, chronic pain syndrome, GERD was evaluated by telemedicine today.  Overall, she reports doing well with her mood, depression, and anxiety. She denies experiencing significant sadness, hopelessness, or changes in her ability to enjoy activities. She denies noticing any memory changes and reports a good appetite.  She denies having suicidal thoughts or thoughts of harming others.  She denies any perceptual  disturbances.  She reports taking mirtazapine  (Remeron ) 15 mg and venlafaxine  75 mg (2 capsules), noting no changes to her medication regimen since her last visit. She states she has not started any new medications for pain or other reasons.  She denies use of alcohol or other substances.  She recently had back surgery and reports her pain is currently manageable.    Visit Diagnosis:    ICD-10-CM   1. Primary insomnia  F51.01     2. MDD (major depressive disorder), recurrent, in full remission  F33.42     3. GAD (generalized anxiety disorder)  F41.1     4. Alcohol use disorder, moderate, in sustained remission (HCC)  F10.21       Past Psychiatric History: I have reviewed past psychiatric history from progress note on 12/26/2021.  Past trials of medications like sertraline , BuSpar  multiple others does not remember all the names.  1 suicide attempt as a teenager-overdosed on aspirin.  Past Medical History:  Past Medical History:  Diagnosis Date   Alcohol abuse    Allergy    Anemia    Anxiety    Arthritis    Cataract    Cervicalgia    Cirrhosis (HCC) 1994   COPD (chronic obstructive pulmonary disease) (HCC)    wears O2 at night   Depression    Difficult intubation    has plates and screws in neck   Dyspnea    GERD (gastroesophageal reflux disease)    Headache    Heart murmur    on heard when pt is lying   Hypertension    Neuromuscular disorder (HCC) 2017   Neuropathy   Neuropathy    Other  and unspecified hyperlipidemia    Oxygen  deficiency    Status post insertion of spinal cord stimulator    Substance abuse (HCC) 1994   Date stopped   Tachycardia    d/t questionable anxiety happens every 5- 10 years, sees [facility]    Past Surgical History:  Procedure Laterality Date   ANTERIOR CERVICAL DECOMP/DISCECTOMY FUSION  2012, 2015, 2018   x3   APPLICATION OF INTRAOPERATIVE CT SCAN N/A 05/23/2024   Procedure: APPLICATION OF INTRAOPERATIVE CT SCAN;  Surgeon:  Claudene Penne ORN, MD;  Location: [facility] ORS;  Service: Neurosurgery;  Laterality: N/A;   AUGMENTATION MAMMAPLASTY Bilateral 1982   BREAST ENHANCEMENT SURGERY  1981   CARPAL TUNNEL RELEASE  11/11/2011   Procedure: CARPAL TUNNEL RELEASE;  Surgeon: Catalina CHRISTELLA Stains, MD;  Location: [facility];  Service: Neurosurgery;  Laterality: Right;  Right Median Nerve Decompression   CARPAL TUNNEL RELEASE  2013   COLONOSCOPY WITH PROPOFOL  N/A 11/01/2017   Procedure: COLONOSCOPY WITH PROPOFOL ;  Surgeon: Viktoria Lamar DASEN, MD;  Location: [facility] ENDOSCOPY;  Service: Endoscopy;  Laterality: N/A;   COLONOSCOPY WITH PROPOFOL  N/A 08/09/2023   Procedure: COLONOSCOPY WITH PROPOFOL ;  Surgeon: Onita Elspeth Sharper, DO;  Location: [facility] ENDOSCOPY;  Service: Gastroenterology;  Laterality: N/A;   ESOPHAGOGASTRODUODENOSCOPY     ESOPHAGOGASTRODUODENOSCOPY (EGD) WITH PROPOFOL  N/A 08/09/2023   Procedure: ESOPHAGOGASTRODUODENOSCOPY (EGD) WITH PROPOFOL ;  Surgeon: Onita Elspeth Sharper, DO;  Location: [facility] ENDOSCOPY;  Service: Gastroenterology;  Laterality: N/A;   JOINT REPLACEMENT  2022   LUMBAR LAMINECTOMY/DECOMPRESSION MICRODISCECTOMY N/A 08/15/2019   Procedure: Lumbar three to Sacral one Decompressive lumbar laminectomy;  Surgeon: Unice Pac, MD;  Location: [facility];  Service: Neurosurgery;  Laterality: N/A;   neck disc surgery     plates and screws in neck x 2   POLYPECTOMY  08/09/2023   Procedure: POLYPECTOMY;  Surgeon: Onita Elspeth Sharper, DO;  Location: [facility] ENDOSCOPY;  Service: Gastroenterology;;   ROTATOR CUFF REPAIR  06/16/2016   right shoulder    ROTATOR CUFF REPAIR     SPINAL CORD STIMULATOR BATTERY EXCHANGE Right 01/04/2024   Procedure: SPINAL CORD STIMULATOR BATTERY EXCHANGE;  Surgeon: Claudene Penne ORN, MD;  Location: [facility] ORS;  Service: Neurosurgery;  Laterality: Right;  Revision Internal Pulse Generator for Spinal Cord Stimulator (Non-rechargeable)   SPINAL CORD STIMULATOR INSERTION N/A 03/17/2021   Procedure:  THORACIC SPINAL CORD STIMULATOR (PERCUTANEOUS) & PULSE GENERATOR PLACEMENT;  Surgeon: Bluford Elspeth, MD;  Location: [facility] ORS;  Service: Neurosurgery;  Laterality: N/A;   SPINE SURGERY  2020   TONSILLECTOMY AND ADENOIDECTOMY  1973   TOTAL HIP ARTHROPLASTY Right 04/15/2021   Procedure: TOTAL HIP ARTHROPLASTY ANTERIOR APPROACH;  Surgeon: Kathlynn Sharper, MD;  Location: [facility] ORS;  Service: Orthopedics;  Laterality: Right;   TRANSFORAMINAL LUMBAR INTERBODY FUSION (TLIF) WITH PEDICLE SCREW FIXATION 1 LEVEL N/A 05/23/2024   Procedure: L4-5 MAS transforaminal lumbar interbody fusion with pedicle screw fixation, Facectectomy on rigth side, stereotactic navigation;  Surgeon: Claudene Penne ORN, MD;  Location: [facility] ORS;  Service: Neurosurgery;  Laterality: N/A;    Family Psychiatric History: I have reviewed family psychiatric history from progress note on 12/26/2021.  Family History:  Family History  Problem Relation Age of Onset   Alcohol abuse Mother    Bipolar disorder Mother    Suicidality Mother    Arthritis Mother    Early death Mother    Pancreatic cancer Father    Alcohol abuse Father    Cancer Father    Drug abuse Sister  Alcohol abuse Sister    Depression Sister    Alcohol abuse Sister    Anxiety disorder Sister    Anesthesia problems Neg Hx    Breast cancer Neg Hx     Social History: I have reviewed social history from progress note on 12/26/2021. Social History   Socioeconomic History   Marital status: Married    Spouse name: jerry   Number of children: 0   Years of education: Not on file   Highest education level: Bachelor's degree (e.g., BA, AB, BS)  Occupational History   Not on file  Tobacco Use   Smoking status: Former    Current packs/day: 0.00    Average packs/day: 2.0 packs/day for 45.0 years (90.0 ttl pk-yrs)    Types: Cigarettes    Start date: 10/29/1975    Quit date: 10/28/2020    Years since quitting: 3.8   Smokeless tobacco: Never   Tobacco comments:    Quit  smoking in 2022- khj 09/24/2023  Vaping Use   Vaping status: Former   Devices: vape occasionally  Substance and Sexual Activity   Alcohol use: No    Comment: recovering alcoholic Since 1994    Drug use: Never   Sexual activity: Not Currently    Birth control/protection: Abstinence  Other Topics Concern   Not on file  Social History Narrative   Married; full time; does not get regular exercise.    Social Drivers of Health   Tobacco Use: Medium Risk (08/16/2024)   Patient History    Smoking Tobacco Use: Former    Smokeless Tobacco Use: Never    Passive Exposure: Not on file  Financial Resource Strain: Low Risk (06/13/2024)   Overall Financial Resource Strain (CARDIA)    Difficulty of Paying Living Expenses: Not very hard  Food Insecurity: No Food Insecurity (06/13/2024)   Epic    Worried About Programme Researcher, Broadcasting/film/video in the Last Year: Never true    Ran Out of Food in the Last Year: Never true  Transportation Needs: No Transportation Needs (06/13/2024)   Epic    Lack of Transportation (Medical): No    Lack of Transportation (Non-Medical): No  Physical Activity: Inactive (06/13/2024)   Exercise Vital Sign    Days of Exercise per Week: 0 days    Minutes of Exercise per Session: Not on file  Stress: No Stress Concern Present (06/13/2024)   [employer] of Occupational Health - Occupational Stress Questionnaire    Feeling of Stress: Only a little  Social Connections: Moderately Isolated (06/13/2024)   Social Connection and Isolation Panel    Frequency of Communication with Friends and Family: More than three times a week    Frequency of Social Gatherings with Friends and Family: Three times a week    Attends Religious Services: Never    Active Member of Clubs or Organizations: No    Attends Banker Meetings: Not on file    Marital Status: Married  Depression (PHQ2-9): Low Risk (08/16/2024)   Depression (PHQ2-9)    PHQ-2 Score: 0  Alcohol Screen: Low Risk  (06/26/2024)   Alcohol Screen    Last Alcohol Screening Score (AUDIT): 0  Housing: Unknown (06/13/2024)   Epic    Unable to Pay for Housing in the Last Year: No    Number of Times Moved in the Last Year: Not on file    Homeless in the Last Year: No  Utilities: Not At Risk (05/23/2024)   Epic    Threatened with  loss of utilities: No  Health Literacy: Not on file    Allergies: Allergies[1]  Metabolic Disorder Labs: Lab Results  Component Value Date   HGBA1C 5.3 11/23/2022   No results found for: PROLACTIN Lab Results  Component Value Date   CHOL 141 06/26/2024   TRIG 128 06/26/2024   HDL 37 (L) 06/26/2024   CHOLHDL 3.8 06/26/2024   LDLCALC 81 06/26/2024   LDLCALC 97 07/19/2023   Lab Results  Component Value Date   TSH 3.673 03/10/2022   TSH 2.170 11/17/2021    Therapeutic Level Labs: No results found for: LITHIUM No results found for: VALPROATE No results found for: CBMZ  Current Medications: Current Outpatient Medications  Medication Sig Dispense Refill   acetaminophen  (TYLENOL ) 500 MG tablet Take 2 tablets (1,000 mg total) by mouth every 6 (six) hours.     acyclovir  ointment (ZOVIRAX ) 5 % :Topical Every 3 Hours as needed for lesion 30 g 1   albuterol  (VENTOLIN  HFA) 108 (90 Base) MCG/ACT inhaler Inhale 2 puffs into the lungs every 6 (six) hours as needed for shortness of breath. 18 g 10   atorvastatin  (LIPITOR) 40 MG tablet Take 1 tablet (40 mg total) by mouth daily. 90 tablet 3   Budeson-Glycopyrrol-Formoterol  (BREZTRI  AEROSPHERE) 160-9-4.8 MCG/ACT AERO Inhale 2 puffs into the lungs in the morning and at bedtime. 3 each 3   carvedilol  (COREG ) 12.5 MG tablet TAKE ONE TABLET BY MOUTH AT BREAKFAST AND AT BEDTIME 180 tablet 3   ezetimibe  (ZETIA ) 10 MG tablet Take 1 tablet (10 mg total) by mouth daily. 90 tablet 3   ferrous sulfate  325 (65 FE) MG tablet Take 325 mg by mouth daily with breakfast.     fexofenadine (ALLEGRA) 180 MG tablet Take 180 mg by mouth  daily.     furosemide  (LASIX ) 20 MG tablet TAKE 1 TABLET BY MOUTH ONCE DAILY 30 tablet 2   gabapentin  (NEURONTIN ) 400 MG capsule Take 1 capsule (400 mg total) by mouth at bedtime. 30 capsule 5   ipratropium (ATROVENT ) 0.03 % nasal spray Place 2 sprays into both nostrils 3 (three) times daily as needed for rhinitis. 30 mL 1   leflunomide  (ARAVA ) 20 MG tablet Take 20 mg by mouth at bedtime.     losartan  (COZAAR ) 25 MG tablet Take 1 tablet (25 mg total) by mouth daily. 90 tablet 3   meloxicam  (MOBIC ) 15 MG tablet Take 15 mg by mouth at bedtime.     mirtazapine  (REMERON ) 15 MG tablet TAKE 1 TABLET BY MOUTH EVERY DAY AT BEDTIME 30 tablet 11   Misc Natural Products (BRAINSTRONG MEMORY SUPPORT PO) Take 2 capsules by mouth daily.     montelukast  (SINGULAIR ) 10 MG tablet TAKE 1 TABLET BY MOUTH EVERY DAY AT BEDTIME 30 tablet 11   Multiple Vitamin (MULTIVITAMIN WITH MINERALS) TABS tablet Take 1 tablet by mouth daily.     Nebulizer MISC Compressor nebulizer to use with nebulized medications as instructed 1 each 0   oxyCODONE  (OXY IR/ROXICODONE ) 5 MG immediate release tablet Take 1-2 tablets (5-10 mg total) by mouth every 8 (eight) hours as needed for severe pain (pain score 7-10). 40 tablet 0   oxyCODONE -acetaminophen  (PERCOCET) 5-325 MG tablet Take 1 tablet by mouth every 12 (twelve) hours as needed for severe pain (pain score 7-10). Must last 30 days. 60 tablet 0   [START ON 09/15/2024] oxyCODONE -acetaminophen  (PERCOCET) 5-325 MG tablet Take 1 tablet by mouth every 12 (twelve) hours as needed for severe pain (pain score 7-10).  Must last 30 days. 60 tablet 0   [START ON 10/15/2024] oxyCODONE -acetaminophen  (PERCOCET) 5-325 MG tablet Take 1 tablet by mouth every 12 (twelve) hours as needed for severe pain (pain score 7-10). Must last 30 days. 60 tablet 0   pantoprazole  (PROTONIX ) 40 MG tablet Take 40 mg by mouth 2 (two) times daily.     polyethylene glycol powder (GLYCOLAX /MIRALAX ) 17 GM/SCOOP powder Take 17 g by  mouth daily as needed for mild constipation. Dissolve 1 capful (17g) in 4-8 ounces of liquid and take by mouth daily. 238 g 0   potassium chloride  SA (KLOR-CON  M) 20 MEQ tablet TAKE 1 TABLET BY MOUTH EVERY MORNING 90 tablet 11   senna (SENOKOT) 8.6 MG TABS tablet Take 1 tablet (8.6 mg total) by mouth 2 (two) times daily. 30 tablet 0   tiZANidine  (ZANAFLEX ) 4 MG tablet Take 1 tablet (4 mg total) by mouth every 6 (six) hours as needed for muscle spasms. 30 tablet 0   topiramate  (TOPAMAX ) 25 MG tablet Take 1 tablet (25 mg total) by mouth 2 (two) times daily. 60 tablet 11   valACYclovir  (VALTREX ) 1000 MG tablet Take 2 tablets daily for 5 days as needed for cold sores. 10 tablet 11   venlafaxine  XR (EFFEXOR -XR) 75 MG 24 hr capsule TAKE 2 CAPSULES BY MOUTH DAILY WITH BREAKFAST *DOSE CHANGE* 60 capsule 11   XIIDRA  5 % SOLN Place 1 drop into both eyes daily as needed (dry eyes).     No current facility-administered medications for this visit.     Musculoskeletal: Strength & Muscle Tone: UTA Gait & Station: Seated Patient leans: N/A  Psychiatric Specialty Exam: Review of Systems  Psychiatric/Behavioral: Negative.      There were no vitals taken for this visit.There is no height or weight on file to calculate BMI.  General Appearance: Casual  Eye Contact:  Fair  Speech:  Clear and Coherent  Volume:  Normal  Mood:  Euthymic  Affect:  Congruent  Thought Process:  Goal Directed and Descriptions of Associations: Intact  Orientation:  Full (Time, Place, and Person)  Thought Content: Logical   Suicidal Thoughts:  No  Homicidal Thoughts:  No  Memory:  Immediate;   Fair Recent;   Fair Remote;   Fair  Judgement:  Fair  Insight:  Fair  Psychomotor Activity:  Normal  Concentration:  Concentration: Fair and Attention Span: Fair  Recall:  [employer] of Knowledge: Fair  Language: Fair  Akathisia:  No  Handed:  Right  AIMS (if indicated): not done  Assets:  Manufacturing Systems Engineer Desire for  Improvement Housing Social Support Transportation  ADL's:  Intact  Cognition: WNL  Sleep:  Fair   Screenings: AIMS    Flowsheet Row Video Visit from 08/13/2022 in [facility] Office Visit from 05/13/2022 in [facility] Office Visit from 03/31/2022 in [facility] Office Visit from 01/22/2022 in [facility] Office Visit from 12/26/2021 in [facility]  AIMS Total Score 0 0 0 0 0   GAD-7    Flowsheet Row Office Visit from 06/26/2024 in [facility] Office Visit from 06/13/2024 in [facility] Office Visit from 01/31/2024 in [facility] Office Visit from 11/26/2023 in [facility] Office Visit from 10/19/2023 in [facility]  Total GAD-7 Score 0 0 0 0  0   PHQ2-9    Flowsheet Row Video Visit from 08/16/2024 in [facility] Office Visit from 07/18/2024 in [facility] Visit from 06/26/2024 in [facility] Office Visit from 06/13/2024 in [facility] Office Visit from 04/25/2024 in [facility] Total Score 0 0 0 0 0  PHQ-9 Total Score -- -- 0 0 --   Flowsheet Row Video Visit from 08/16/2024 in [facility] Admission (Discharged) from 05/23/2024 in [facility] Video Visit from 04/25/2024 in [facility]  C-SSRS RISK CATEGORY Moderate Risk No Risk Moderate Risk     Assessment and Plan: Shelise Maron Cabeza is a 68 year old Caucasian female who  presented for a follow-up appointment, discussed assessment and plan as noted below.  1. Primary insomnia-stable Currently reports sleep is overall good. Continue sleep hygiene techniques and sufficient pain management. Continue Mirtazapine  15 mg at bedtime  2. MDD (major depressive disorder), recurrent, in full remission Currently denies any depression symptoms Continue Venlafaxine  extended release 150 mg daily, reduced dosage. Continue Mirtazapine  15 mg at bedtime  3. GAD (generalized anxiety disorder)-stable Denies any significant anxiety symptoms Continue Mirtazapine  and Venlafaxine  as prescribed  4. Alcohol use disorder, moderate, in sustained remission (HCC) Currently sober.  Has been sober since 1994.  Follow-up Follow-up in clinic in 3 months or sooner in person.  Consent: Patient/Guardian gives verbal consent for treatment and assignment of benefits for services provided during this visit. Patient/Guardian expressed understanding and agreed to proceed.  This note was generated in part or whole with voice recognition software. Voice recognition is usually quite accurate but there are transcription errors that can and very often do occur. I apologize for any typographical errors that were not detected and corrected.     Shilpa Bushee, MD 08/16/2024, 12:46 PM     [1]  Allergies Allergen Reactions   Sulfasalazine Other (See Comments)    Does not remember reaction    Plaquenil [Hydroxychloroquine] Rash

## 2024-08-21 ENCOUNTER — Encounter: Payer: Self-pay | Admitting: Physician Assistant

## 2024-08-21 ENCOUNTER — Encounter: Payer: Self-pay | Admitting: Podiatry

## 2024-08-21 ENCOUNTER — Ambulatory Visit: Admitting: Physician Assistant

## 2024-08-21 ENCOUNTER — Ambulatory Visit: Admitting: Podiatry

## 2024-08-21 ENCOUNTER — Ambulatory Visit

## 2024-08-21 VITALS — BP 124/78 | Temp 98.5°F | Wt 168.0 lb

## 2024-08-21 DIAGNOSIS — G629 Polyneuropathy, unspecified: Secondary | ICD-10-CM | POA: Diagnosis not present

## 2024-08-21 DIAGNOSIS — M4802 Spinal stenosis, cervical region: Secondary | ICD-10-CM

## 2024-08-21 DIAGNOSIS — M2042 Other hammer toe(s) (acquired), left foot: Secondary | ICD-10-CM | POA: Diagnosis not present

## 2024-08-21 DIAGNOSIS — Z981 Arthrodesis status: Secondary | ICD-10-CM

## 2024-08-21 DIAGNOSIS — L97521 Non-pressure chronic ulcer of other part of left foot limited to breakdown of skin: Secondary | ICD-10-CM | POA: Diagnosis not present

## 2024-08-21 DIAGNOSIS — Z09 Encounter for follow-up examination after completed treatment for conditions other than malignant neoplasm: Secondary | ICD-10-CM | POA: Diagnosis not present

## 2024-08-21 DIAGNOSIS — M79675 Pain in left toe(s): Secondary | ICD-10-CM

## 2024-08-21 DIAGNOSIS — M5030 Other cervical disc degeneration, unspecified cervical region: Secondary | ICD-10-CM

## 2024-08-21 DIAGNOSIS — E46 Unspecified protein-calorie malnutrition: Secondary | ICD-10-CM

## 2024-08-21 DIAGNOSIS — M79674 Pain in right toe(s): Secondary | ICD-10-CM

## 2024-08-21 DIAGNOSIS — M48062 Spinal stenosis, lumbar region with neurogenic claudication: Secondary | ICD-10-CM

## 2024-08-21 DIAGNOSIS — B351 Tinea unguium: Secondary | ICD-10-CM | POA: Diagnosis not present

## 2024-08-21 MED ORDER — DOXYCYCLINE HYCLATE 100 MG PO CAPS: 100.0000 mg | ORAL_CAPSULE | Freq: Two times a day (BID) | ORAL | 0 refills | Status: AC

## 2024-08-21 NOTE — Progress Notes (Signed)
 "  REFERRING PHYSICIAN:  Donzella Lauraine LOISE [name],  [name]  DOS: 05/23/24, L4-5 MAS TLIF, 5/20 SCS D battery exhange  Patient is approximately 69 weeks status post above surgery.  She feels as though her back is feeling better, but she continues to have neck pain that has become progressively worse.  She states that it hurts all the time.  She feels though her range of motion has become limited and her walking feels unsteady.  She has that the neck pain goes up the back of her head, but does not radiate down her arms.  She has been taking gabapentin  and Percocet for pain.    PHYSICAL EXAMINATION:  General: Patient is well developed, well nourished, calm, collected, and in no apparent distress.   NEUROLOGICAL:  General: In no acute distress.   Awake, alert, oriented to person, place, and time.  Pupils equal round and reactive to light.  Facial tone is symmetric.   Patient wheezing on examination.   Strength:  Full strength in uppers bilaterally   Gait instability noted when walking. Positive Hoffmann sign bilaterally.            Side Iliopsoas Quads Hamstring PF DF EHL  R 5 5 5 5 4 4   L 5 5 5 5 5 5    Incisions c/d/I. Reflexes 3+ in uppers. 2+ in lowers.   ROS (Neurologic):  Negative except as noted above  IMAGING: EXAM: CT CERVICAL SPINE WITHOUT CONTRAST 07/07/2024 10:02:12 AM   TECHNIQUE: CT of the cervical spine was performed without the administration of intravenous contrast. Multiplanar reformatted images are provided for review. Automated exposure control, iterative reconstruction, and/or weight based adjustment of the mA/kV was utilized to reduce the radiation dose to as low as reasonably achievable.   COMPARISON: CT cervical myelogram 10/22/2016. Cervical spine MRI 02/08/2024.   CLINICAL HISTORY: 69 year old female with prior fusion and concern for C2-C3 degeneration.   FINDINGS:   CERVICAL SPINE:   BONES AND  ALIGNMENT: No acute fracture or traumatic malalignment. Extensive chronic anterior cervical fusion. ACDF hardware remains in place from C3 through C6 with solid arthrodesis at those levels. Stable straightening of lordosis. Stable chronic mild anterolisthesis of C3 on C4. Chronic abnormal ACDF hardware at C7-T1, a continuous plate with the C6 cortical screws. The T1 level hardware is chronically angulated ventrally from the vertebral body a distance of 5 to 6 mm, and appears stable since 2018. There is evidence of chronic pseudarthrosis at C6-C7. However, interbody and posterior element arthrodesis at C7-T1 has progressed and now appears solid. Visible upper thoracic vertebrae appear intact.   DEGENERATIVE CHANGES: Substantially progressed and now severe degeneration at C1-C2, especially at the anterior C1-odontoid. Development of odontoid subchondral cysts, and progressed partially calcified ligamentous hypertrophy about the odontoid. Progressed chronic C2-C3 degeneration with evidence of severe multifactorial spinal stenosis there now, which is in part related to interval severe ligamentum flavum hypertrophy which is evident on sagittal image 33. See also series 4 image 25. Evidence of spinal cord mass effect there. Spinal stenosis at that level probably has not significantly changed from the 02/08/2024 MRI. Bulky T1-T2 ligament flavum hypertrophy and calcification has progressed since 2018.   SOFT TISSUES: No prevertebral soft tissue swelling. Stable non-contrast visible neck soft tissues. Chronic cervical carotid calcified atherosclerosis. Centrilobular emphysema in the visible upper lungs. Grossly negative visible posterior fossa.   IMPRESSION: 1. Chronic anterior cervical fusion with solid arthrodesis from C3-C6 and at C7-T1. Chronic  pseudarthrosis at C6-C7. And chronic ventral angulation of the lowest ACDF hardware which appears stable since 2018. 2. Since 2018 progressed and  severe C1-C2 degeneration and bulky C2-C3 degeneration with moderate multifactorial spinal stenosis there - with spinal cord mass effect - likely stable from 02/08/2024 MRI.  EXAM: CERVICAL SPINE - COMPLETE 4+ VIEW   COMPARISON:  Radiograph dated: 05/24/2023.   FINDINGS: 7 non-rib-bearing cervical vertebral bodies.   Anterior fusion of C3-T1.  No evidence of hardware complication.   Normal mineralization.   Grade 1 anterolisthesis of C2 on C3 which increases by 3 mm on flexion maneuvers.   Maintained vertebral heights, no fracture.   No significant facet arthrosis.   Preserved disc height at C2-C3. No significant osteophytes or endplate irregularity.   Soft tissues are unremarkable.   Visualized lungs are clear.   IMPRESSION: Postsurgical changes and anterolisthesis as above. No acute finding or hardware complication identified.      Assessment and Plan Gabrielle Pineda is a 69 y/o with Cervical spondylolisthesis with myelopathy and mechanical neck pain And grade 1 anterolisthesis of C2 on C3, with worsening pain on flexion.  Positive Hoffmann's and hyperreflexia indicating possible myelopathy.  On most recent CT severe C1-2 and C2-3 degeneration with significant stenosis here as well as spinal cord mass effect that is stable since films in June 2025.  From a back surgery standpoint she is stable and doing well. - Plan for patient to discuss further surgery with Dr. Claudene - Discussed my concerns with previous issues with wound healing in regards to her having proper nutrition.  I have re- placed a order for dietetics and nutrition to see her to optimize her protein in the setting of likely further surgery in the future.    Lyle Decamp, PA-C Department of neurosurgery    "

## 2024-08-21 NOTE — Progress Notes (Signed)
 "  Subjective:  Patient ID: Gabrielle Pineda, female    DOB: 12/12/1955,  MRN: [id number]  Gabrielle Pineda presents to clinic today for at risk foot care with history of peripheral neuropathy and painful thick toenails that are difficult to trim. Pain interferes with ambulation. Aggravating factors include wearing enclosed shoe gear. Pain is relieved with periodic professional debridement.  Chief Complaint  Patient presents with   Nail Problem    She denies being diabetic. Dr. Donzella is her PCP last visit was in Nov   New problem(s): Patient states she developed a blister on her left 2nd toe. Unsure of date as she has no feeling in her feet due to neuropathy. Denies any redness, drainage, swelling or odor. She has severe hammertoe deformity of digit.   PCP is [name], DO.  Allergies[1]  Review of Systems: Negative except as noted in the HPI.  Objective: No changes noted in today's physical examination. There were no vitals filed for this visit. Gabrielle Pineda is a pleasant 69 y.o. female WD, WN in NAD. AAO x 3.  Vascular Examination: Capillary refill time immediate b/l. Palpable pedal pulses. No pain with calf compression b/l. Skin temperature gradient WNL b/l. No cyanosis or clubbing b/l. No ischemia or gangrene noted b/l. Pedal hair absent.  Neurological Examination: Sensation grossly intact b/l with 10 gram monofilament. Vibratory sensation intact b/l. Pt has subjective symptoms of neuropathy.  Dermatological Examination: No interdigital macerations.   Wound Location: dorsal PIPJ left 2nd toe There is a minimal amount of devitalized tissue present in the wound. Wound Measurement:  1.0  x 1.0 x 0.1 cm. Ulcer was not debrided this visit Wound Base: Mixed Granular/Fibrotic Peri-wound: Normal Exudate: None: wound tissue dry Blood Loss during debridement: None as ulcer was not debrided today. Sign(s) of clinical bacterial infection: no clinical signs of infection  noted on examination today.   Toenails 1-5 b/l thick, discolored, elongated with subungual debris and pain on dorsal palpation.   Resolved hyperkeratotic lesion(s) dorsal PIPJ of L 4th toe.  No erythema, no edema, no drainage, no fluctuance.  Musculoskeletal Examination: Muscle strength 5/5 to all lower extremity muscle groups bilaterally. HAV with bunion deformity noted b/l LE. Severe hammertoe deformity noted 2-5 bilaterally.  Radiographs: None  Assessment: 1. Pain due to onychomycosis of toenails of both feet   2. Skin ulcer of second toe of left foot, limited to breakdown of skin (HCC)   3. Hammertoe of second toe of left foot   4. Neuropathy     Plan: Meds ordered this encounter  Medications   doxycycline  (VIBRAMYCIN ) 100 MG capsule    Sig: Take 1 capsule (100 mg total) by mouth 2 (two) times daily for 10 days.    Dispense:  20 capsule    Refill:  0  -Patient was evaluated and treated and all questions answered.  -Patient/POA/Family member educated on diagnosis and treatment plan of wound care.  -Today's ulcer size  1.0 x 1.0 x 0.1 cm. -Ulceration cleansed with wound cleanser. Betadine Solution applied to base of ulceration and secured with light dressing. -Rx for Doxycyline 100 mg, #20, to be taken one capsule twice daily for 10 days.  Patient to follow up with Dr. Silva in 2 weeks for ulcer left 2nd digit. -Patient was evaluated and treated. All patient's and/or POA's questions/concerns addressed on today's visit. Toenails 1-5 bilaterally debrided in length and girth without incident. Continue soft, supportive shoe gear daily. Report any pedal injuries to  medical professional. Call office if there are any questions/concerns. -Patient/POA to call should there be question/concern in the interim. -Return in about 2 weeks (around 09/04/2024) for ulcer left 2nd toe. Follow up with me in 3 months for routine foot care.  Delon LITTIE Merlin, DPM      [place] LOCATION: [address], [place] [address]                   Office [phone]   [facility] LOCATION: [address] [place], [name] Office [phone]      [1]  Allergies Allergen Reactions   Sulfasalazine Other (See Comments)    Does not remember reaction    Plaquenil [Hydroxychloroquine] Rash   "

## 2024-08-24 ENCOUNTER — Other Ambulatory Visit (INDEPENDENT_AMBULATORY_CARE_PROVIDER_SITE_OTHER)

## 2024-08-24 DIAGNOSIS — J441 Chronic obstructive pulmonary disease with (acute) exacerbation: Secondary | ICD-10-CM

## 2024-08-24 MED ORDER — BREZTRI AEROSPHERE 160-9-4.8 MCG/ACT IN AERO: 2.0000 | INHALATION_SPRAY | Freq: Two times a day (BID) | RESPIRATORY_TRACT | 3 refills | Status: AC

## 2024-08-24 NOTE — Addendum Note (Signed)
 Addended by: MARSH, Vaun Hyndman E on: 08/24/2024 12:07 PM   Modules accepted: Orders

## 2024-08-24 NOTE — Telephone Encounter (Signed)
 Per [facility] Allyson spoke with,pt at this time pt wants to hold off on ABBVIE (Linzess ) pt will call at a later time if she decide to proceed with completing the application.

## 2024-08-24 NOTE — Progress Notes (Signed)
 "  08/24/2024 Name: Gabrielle Pineda MRN: [id number] DOB: 1956/05/17   Gabrielle Pineda Debruyn is a 69 y.o. year old female who presented for a telephone visit.   They were referred to the pharmacist by their PCP for assistance in managing medication access.    Subjective:  Care Team: Primary Care Provider: [name], DO  Medication Access/Adherence  Current Pharmacy:  [facility] [place]  - [place], [place] - [address] [place] [address] Phone: [phone] Fax: [phone]  [facility] [address] ([address]), [place] - [address] [place] ([address]) [place] [address] Phone: [phone] Fax: [phone]  [facility] [address] [name] Phone: [phone] Fax: [phone]   Patient reports affordability concerns with their medications: No  Patient reports access/transportation concerns to their pharmacy: No  Patient reports adherence concerns with their medications:  No    Reports now uses Linzess  only on an as needed basis as discussed with her GI Specialist . She states she has not used this in a few months  COPD/Allergic Rhinitis:   Current medications: - Breztri  inhaler - 2 puffs in morning and 2 puffs at bedtime - albuterol  inhaler - 2 puffs every 6 hours as needed for wheezing/shortness of breath - Fexofenadine 180 mg daily - montelukast  10 mg at bedtime   Confirms rinsing out mouth after each use of Breztri  inhaler   Current medication access support: Enrolled in patient assistance for Breztri  inhaler from [employer] through 08/16/2025   Objective:  Lab Results  Component Value Date   HGBA1C 5.3 11/23/2022    Lab Results  Component Value Date   CREATININE 1.43 (H) 06/26/2024   BUN 12 06/26/2024   NA 137 06/26/2024   K 4.2 06/26/2024   CL 106 06/26/2024   CO2 17 (L) 06/26/2024    Lab Results  Component  Value Date   CHOL 141 06/26/2024   HDL 37 (L) 06/26/2024   LDLCALC 81 06/26/2024   TRIG 128 06/26/2024   CHOLHDL 3.8 06/26/2024    Medications Reviewed Today     Reviewed by Marsh, Jaleya Pebley E, RPH-CPP (Pharmacist) on 08/24/24 at 1147  Med List Status: <None>   Medication Order Taking? Sig Documenting Provider Last Dose Status Informant  acetaminophen  (TYLENOL ) 500 MG tablet [id number] Yes Take 2 tablets (1,000 mg total) by mouth every 6 (six) hours. Gregory Edsel Ruth, PA  Active   acyclovir  ointment (ZOVIRAX ) 5 % [id number] Yes :Topical Every 3 Hours as needed for lesion Pardue, Sarah N, DO  Active Self  albuterol  (VENTOLIN  HFA) 108 (90 Base) MCG/ACT inhaler [id number] Yes Inhale 2 puffs into the lungs every 6 (six) hours as needed for shortness of breath. [name], DO  Active   atorvastatin  (LIPITOR) 40 MG tablet [id number] Yes Take 1 tablet (40 mg total) by mouth daily. Gollan, Timothy J, MD  Active Self  Budeson-Glycopyrrol-Formoterol  (BREZTRI  AEROSPHERE) 160-9-4.8 MCG/ACT AERO [id number] Yes Inhale 2 puffs into the lungs in the morning and at bedtime. Tamea Dedra CROME, MD  Active Self  carvedilol  (COREG ) 12.5 MG tablet [id number] Yes TAKE ONE TABLET BY MOUTH AT BREAKFAST AND AT BEDTIME Gollan, Timothy J, MD  Active Self  doxycycline  (VIBRAMYCIN ) 100 MG capsule [id number] Yes Take 1 capsule (100 mg total) by mouth 2 (two) times daily for 10 days. Gaynel Delon CROME, DPM  Active   ezetimibe  (ZETIA ) 10 MG tablet [id number] Yes Take  1 tablet (10 mg total) by mouth daily. Gollan, Timothy J, MD  Active Self  ferrous sulfate  325 (65 FE) MG tablet [id number] Yes Take 325 mg by mouth daily with breakfast. [provider]  Active Self  fexofenadine (ALLEGRA) 180 MG tablet [id number] Yes Take 180 mg by mouth daily. [provider]  Active Self  furosemide  (LASIX ) 20 MG tablet [id number] Yes TAKE 1 TABLET BY MOUTH ONCE DAILY Pardue, Lauraine SAILOR, DO  Active Self  gabapentin  (NEURONTIN )  400 MG capsule [id number] Yes Take 1 capsule (400 mg total) by mouth at bedtime. Patel, Seema K, NP  Active Self  ipratropium (ATROVENT ) 0.03 % nasal spray [id number] Yes Place 2 sprays into both nostrils 3 (three) times daily as needed for rhinitis. [name], DO  Active Self  leflunomide  (ARAVA ) 20 MG tablet [id number] Yes Take 20 mg by mouth at bedtime. [provider]  Active Self  losartan  (COZAAR ) 25 MG tablet [id number] Yes Take 1 tablet (25 mg total) by mouth daily. Gollan, Timothy J, MD  Active Self  meloxicam  (MOBIC ) 15 MG tablet [id number] Yes Take 15 mg by mouth at bedtime. [provider]  Active   mirtazapine  (REMERON ) 15 MG tablet [id number] Yes TAKE 1 TABLET BY MOUTH EVERY DAY AT BEDTIME Eappen, Saramma, MD  Active   Misc Natural Products [facility] MEMORY SUPPORT PO) [id number] Yes Take 2 capsules by mouth daily. [provider]  Active Self  montelukast  (SINGULAIR ) 10 MG tablet [id number] Yes TAKE 1 TABLET BY MOUTH EVERY DAY AT BEDTIME [name], DO  Active Self  Multiple Vitamin (MULTIVITAMIN WITH MINERALS) TABS tablet [id number] Yes Take 1 tablet by mouth daily. [provider]  Active Self  Nebulizer MISC [id number] Yes Compressor nebulizer to use with nebulized medications as instructed Drubel, Manuelita, PA-C  Active Self  oxyCODONE -acetaminophen  (PERCOCET) 5-325 MG tablet [id number] Yes Take 1 tablet by mouth every 12 (twelve) hours as needed for severe pain (pain score 7-10). Must last 30 days. Patel, Seema K, NP  Active   oxyCODONE -acetaminophen  (PERCOCET) 5-325 MG tablet [id number] Yes Take 1 tablet by mouth every 12 (twelve) hours as needed for severe pain (pain score 7-10). Must last 30 days. Patel, Seema K, NP  Active   oxyCODONE -acetaminophen  (PERCOCET) 5-325 MG tablet [id number] Yes Take 1 tablet by mouth every 12 (twelve) hours as needed for severe pain (pain score 7-10). Must last 30 days. Patel, Seema K, NP  Active   pantoprazole   (PROTONIX ) 40 MG tablet [id number] Yes Take 40 mg by mouth 2 (two) times daily. [provider]  Active Self  polyethylene glycol powder (GLYCOLAX /MIRALAX ) 17 GM/SCOOP powder [id number] Yes Take 17 g by mouth daily as needed for mild constipation. Dissolve 1 capful (17g) in 4-8 ounces of liquid and take by mouth daily. Gregory Edsel Ruth, PA  Active   potassium chloride  SA (KLOR-CON  M) 20 MEQ tablet [id number] Yes TAKE 1 TABLET BY MOUTH EVERY MORNING Pardue, Lauraine SAILOR, DO  Active Self  senna (SENOKOT) 8.6 MG TABS tablet [id number] Yes Take 1 tablet (8.6 mg total) by mouth 2 (two) times daily. Gregory Edsel Ruth, PA  Active   tiZANidine  (ZANAFLEX ) 4 MG tablet [id number] Yes Take 1 tablet (4 mg total) by mouth every 6 (six) hours as needed for muscle spasms. Ulis Bottcher, PA-C  Active   topiramate  (TOPAMAX ) 25 MG tablet [id number] Yes Take 1 tablet (25 mg total) by mouth 2 (two) times daily. Patel, Seema K, NP  Active Self  valACYclovir  (VALTREX ) 1000 MG tablet [id number] Yes Take 2 tablets daily for 5 days as needed for cold sores. [name], DO  Active Self  venlafaxine  XR (EFFEXOR -XR) 75 MG 24 hr capsule [id number] Yes TAKE 2 CAPSULES BY MOUTH DAILY WITH BREAKFAST *DOSE CHANGE* Eappen, Saramma, MD  Active Self  XIIDRA  5 % SOLN [id number] Yes Place 1 drop into both eyes daily as needed (dry eyes). [provider]  Active Self  Med List Note Lonna Doyal SAUNDERS, [place] 07/18/24 1141): 07/18/24 Pain contract signed DRN UDS 10/26/23 MR 11/15/18              Assessment/Plan:   COPD/Allergic Rhinitis: - Breztri  refill sent to [employer] assistance program   MISC: patient reports she is not sure if she needs to re-enroll in PAP for Linzess  at this time as she has not been taking it. She will let us  know if we need to continue the process at a later time.   Follow Up Plan:  -PCP on 11/27/24  Peyton CHARLENA Ferries, PharmD, CPP Clinical Pharmacist [facility] [phone]    "

## 2024-08-31 ENCOUNTER — Ambulatory Visit: Payer: Self-pay

## 2024-08-31 NOTE — Telephone Encounter (Signed)
 Pt stated her commode is full of blood every time she uses the restroom x 2-3 weeks. When the ED was advised, pt stated it's only on the toilet paper when I wipe. This RN noted discrepancies in the pt's self-assessment and continued to advise the ED. Please reach out to patient if possible.   FYI Only or Action Required?: FYI only for provider: ED advised.  Patient was last seen in primary care on 06/26/2024 by [name], DO.  Called Nurse Triage reporting Rectal Bleeding.  Symptoms began several weeks ago.  Interventions attempted: Nothing.  Symptoms are: gradually worsening.  Triage Disposition: Go to ED Now (Notify PCP)  Patient/caregiver understands and will follow disposition?: No, wishes to speak with PCP   Copied from CRM [id number]. Topic: Clinical - Red Word Triage >> Aug 31, 2024  2:42 PM Gabrielle Pineda wrote: Red Word that prompted transfer to Nurse Triage: Bleeding thinking it was hemorrohids, pt has constipatoin. When pt wipes there is blood and when she stands up of commode it is full of blood. Transfer to NT Reason for Disposition  SEVERE rectal bleeding (e.g., large blood clots; constant or on and off bleeding)  Answer Assessment - Initial Assessment Questions 1. APPEARANCE of BLOOD: What color is it? Is it passed separately, on the surface of the stool, or mixed in with the stool?      Bright red  2. AMOUNT: How much blood was passed?      Commode is full of blood  3. FREQUENCY: How many times has blood been passed with the stools?      Every time I go to the bathroom, even when I pee  4. ONSET: When was the blood first seen in the stools? (Days or weeks)      2-3 weeks  5. DIARRHEA: Is there also some diarrhea? If Yes, ask: How many diarrhea stools in the past 24 hours?      Denies  6. CONSTIPATION: Do you have constipation? If Yes, ask: How bad is it?     Denies   7. RECURRENT SYMPTOMS: Have you had blood in your stools before?  If Yes, ask: When was the last time? and What happened that time?      Not before 2-3 weeks ago  8. BLOOD THINNERS: Do you take any blood thinners? (e.g., aspirin, clopidogrel / Plavix, coumadin, heparin). Notes: Other strong blood thinners include: Arixtra (fondaparinux), Eliquis (apixaban), Pradaxa (dabigatran), and Xarelto (rivaroxaban).     Denies  9. OTHER SYMPTOMS: Do you have any other symptoms?  (e.g., abdomen pain, vomiting, dizziness, fever)     Denies  Protocols used: Rectal Bleeding-A-AH

## 2024-09-04 ENCOUNTER — Ambulatory Visit

## 2024-09-04 ENCOUNTER — Ambulatory Visit: Admitting: Podiatry

## 2024-09-04 ENCOUNTER — Ambulatory Visit: Admitting: Neurosurgery

## 2024-09-04 ENCOUNTER — Encounter: Payer: Self-pay | Admitting: Neurosurgery

## 2024-09-04 ENCOUNTER — Ambulatory Visit (INDEPENDENT_AMBULATORY_CARE_PROVIDER_SITE_OTHER)

## 2024-09-04 VITALS — Ht 67.0 in | Wt 168.0 lb

## 2024-09-04 VITALS — BP 118/76 | Wt 168.0 lb

## 2024-09-04 DIAGNOSIS — M2042 Other hammer toe(s) (acquired), left foot: Secondary | ICD-10-CM | POA: Diagnosis not present

## 2024-09-04 DIAGNOSIS — M5031 Other cervical disc degeneration,  high cervical region: Secondary | ICD-10-CM | POA: Diagnosis not present

## 2024-09-04 DIAGNOSIS — M4802 Spinal stenosis, cervical region: Secondary | ICD-10-CM

## 2024-09-04 DIAGNOSIS — L97521 Non-pressure chronic ulcer of other part of left foot limited to breakdown of skin: Secondary | ICD-10-CM

## 2024-09-04 DIAGNOSIS — M5030 Other cervical disc degeneration, unspecified cervical region: Secondary | ICD-10-CM | POA: Diagnosis not present

## 2024-09-04 DIAGNOSIS — M4712 Other spondylosis with myelopathy, cervical region: Secondary | ICD-10-CM

## 2024-09-04 MED ORDER — MUPIROCIN 2 % EX OINT: 1.0000 | TOPICAL_OINTMENT | Freq: Two times a day (BID) | CUTANEOUS | 2 refills | Status: AC

## 2024-09-04 NOTE — Progress Notes (Signed)
 "  REFERRING PHYSICIAN:  Donzella Lauraine LOISE [place] [address] [place],  [name]  DOS: 05/23/24, L4-5 MAS TLIF, 5/20 SCS D battery exhange  Discussed the use of AI scribe software for clinical note transcription with the patient, who gave verbal consent to proceed.  History of Present Illness Gabrielle Pineda is a 69 year old female with prior cervical spine fusion who presents with worsening neck pain and limited range of motion. Neck pain has progressively worsened and is primarily localized to the lateral neck. Movement aggravates the pain and cervical range of motion is markedly restricted, with difficulty turning and rotating her neck. She has had multiple courses of physical therapy and used TENS units without sustained relief. She previously had a spinal cord stimulator placed in her back as well as lumbar fusion, and is not interested in major reconstructive cervical surgery because of her age. She has recurrent occipital headaches, uncertain if related to her cervical spine. She reports worsening gait instability and often needs a cane due to unsteadiness while walking.  PHYSICAL EXAMINATION:  General: Patient is well developed, well nourished, calm, collected, and in no apparent distress.   NEUROLOGICAL:  General: In no acute distress.   Awake, alert, oriented to person, place, and time.  Pupils equal round and reactive to light.  Facial tone is symmetric.   Patient wheezing on examination.   Strength:  Intrinsic hand weakness and grip weakness noted bilaterally 4/5            Side Iliopsoas Quads Hamstring PF DF EHL  R 5 5 5 5 4 4   L 5 5 5 5 5 5    Reflexes 3+ in uppers. 2-3+ in lowers.  Positive Hoffmann sign bilateral  Gait instability noted when walking. ROS (Neurologic):  Negative except as noted above  IMAGING: EXAM: CT CERVICAL SPINE WITHOUT CONTRAST 07/07/2024 10:02:12 AM   TECHNIQUE: CT of the cervical spine was performed without the  administration of intravenous contrast. Multiplanar reformatted images are provided for review. Automated exposure control, iterative reconstruction, and/or weight based adjustment of the mA/kV was utilized to reduce the radiation dose to as low as reasonably achievable.   COMPARISON: CT cervical myelogram 10/22/2016. Cervical spine MRI 02/08/2024.   CLINICAL HISTORY: 69 year old female with prior fusion and concern for C2-C3 degeneration.   FINDINGS:   CERVICAL SPINE:   BONES AND ALIGNMENT: No acute fracture or traumatic malalignment. Extensive chronic anterior cervical fusion. ACDF hardware remains in place from C3 through C6 with solid arthrodesis at those levels. Stable straightening of lordosis. Stable chronic mild anterolisthesis of C3 on C4. Chronic abnormal ACDF hardware at C7-T1, a continuous plate with the C6 cortical screws. The T1 level hardware is chronically angulated ventrally from the vertebral body a distance of 5 to 6 mm, and appears stable since 2018. There is evidence of chronic pseudarthrosis at C6-C7. However, interbody and posterior element arthrodesis at C7-T1 has progressed and now appears solid. Visible upper thoracic vertebrae appear intact.   DEGENERATIVE CHANGES: Substantially progressed and now severe degeneration at C1-C2, especially at the anterior C1-odontoid. Development of odontoid subchondral cysts, and progressed partially calcified ligamentous hypertrophy about the odontoid. Progressed chronic C2-C3 degeneration with evidence of severe multifactorial spinal stenosis there now, which is in part related to interval severe ligamentum flavum hypertrophy which is evident on sagittal image 33. See also series 4 image 25. Evidence of spinal cord mass effect there. Spinal stenosis at that level probably has not significantly changed  from the 02/08/2024 MRI. Bulky T1-T2 ligament flavum hypertrophy and calcification has progressed since 2018.   SOFT  TISSUES: No prevertebral soft tissue swelling. Stable non-contrast visible neck soft tissues. Chronic cervical carotid calcified atherosclerosis. Centrilobular emphysema in the visible upper lungs. Grossly negative visible posterior fossa.   IMPRESSION: 1. Chronic anterior cervical fusion with solid arthrodesis from C3-C6 and at C7-T1. Chronic pseudarthrosis at C6-C7. And chronic ventral angulation of the lowest ACDF hardware which appears stable since 2018. 2. Since 2018 progressed and severe C1-C2 degeneration and bulky C2-C3 degeneration with moderate multifactorial spinal stenosis there - with spinal cord mass effect - likely stable from 02/08/2024 MRI.  EXAM: CERVICAL SPINE - COMPLETE 4+ VIEW   COMPARISON:  Radiograph dated: 05/24/2023.   FINDINGS: 7 non-rib-bearing cervical vertebral bodies.   Anterior fusion of C3-T1.  No evidence of hardware complication.   Normal mineralization.   Grade 1 anterolisthesis of C2 on C3 which increases by 3 mm on flexion maneuvers.   Maintained vertebral heights, no fracture.   No significant facet arthrosis.   Preserved disc height at C2-C3. No significant osteophytes or endplate irregularity.   Soft tissues are unremarkable.   Visualized lungs are clear.   IMPRESSION: Postsurgical changes and anterolisthesis as above. No acute finding or hardware complication identified.      Assessment and Plan Assessment & Plan Cervical spondylosis with myelopathy and adjacent segment stenosis She presents with progressive cervical pain, restricted range of motion, increased cervical kyphosis, and new-onset headaches. Imaging demonstrates solid fusion throughout the cervical spine, with apparent excessive motion at C2-3 and upper cervical segment tightness. Adjacent segment stenosis is likely contributing to her symptoms, including gait instability. Major reconstructive deformity surgery would be required to correct the kyphosis or loss of  lordosis below, but given her age and preference to avoid extensive surgery, this is not recommended. Limited decompression and fusion at the upper cervical level may be considered if instability is confirmed given her progressive cervical myelopathy, though this may further restrict cervical mobility. - Ordered cervical spine x-rays with flexion views to evaluate instability, particularly at C2-3.  - Reviewed imaging and clinical findings with spine surgery partner to assess surgical options. - Planned follow-up after imaging and multidisciplinary review to determine feasibility of limited decompression and fusion at the upper cervical level versus referral to tertiary centers for complex reconstructive surgery. - Discussed non-surgical management, including possible cervical spinal cord stimulator placement, likely difficult given her C2-3 Stenosis - Will contact her with further recommendations after imaging and multidisciplinary review and new xrays have been read  Penne LELON Sharps MD  "

## 2024-09-04 NOTE — Progress Notes (Signed)
"  °  Subjective:  Patient ID: Gabrielle Pineda, female    DOB: 05/07/1956,  MRN: [id number]  Chief Complaint  Patient presents with   Foot Ulcer    Rm 8 Patient is here for ulcer of the left index toe. Wound is open with visible drainage. Pt states changing bandage daily and applying betadine and antibiotic ointment.    69 y.o. female presents with the above complaint. History confirmed with patient.  She is referred in by Dr. Gaynel.  Has a full-thickness ulcer on the left second toe.  Has been applying antibiotic ointment.  Objective:  Physical Exam: Palpable DP pulse, PT plus palpable, capillary fill time is intact, she has decreased peripheral sensation hallux valgus and rigid digital contractures of all toes, full-thickness ulcer dorsal PIPJ measuring 0.4 x 0.5 x 0.2 cm.  No active drainage mild periwound erythema no cellulitis    Radiographs: Multiple views x-ray of the left foot: Arthritic changes, digital contractures, no evidence of osteomyelitis gas-forming infection Assessment:   1. Skin ulcer of second toe of left foot, limited to breakdown of skin (HCC)   2. Hammertoe of second toe of left foot      Plan:  Patient was evaluated and treated and all questions answered.  I debrided the ulcer today with a sharp sterile ring curette to remove nonviable tissue and hyperkeratosis and slough.  Wound does not probe to bone.  Recommended using mupirocin  ointment.  Rx sent to pharmacy.  Return in 3 to 4 weeks to reevaluate.  Long-term discussed surgical reduction with plasty if needed as well.  X-ray reviewed with patient no signs of active osteomyelitis.  If not improving would recommend noninvasive vascular testing and possible MRI.     No follow-ups on file.   "

## 2024-09-07 ENCOUNTER — Encounter: Payer: Self-pay | Admitting: Pulmonary Disease

## 2024-09-07 ENCOUNTER — Ambulatory Visit: Admitting: Pulmonary Disease

## 2024-09-07 VITALS — BP 96/64 | HR 71 | Temp 97.7°F | Ht 67.0 in | Wt 168.0 lb

## 2024-09-07 DIAGNOSIS — Z87891 Personal history of nicotine dependence: Secondary | ICD-10-CM | POA: Diagnosis not present

## 2024-09-07 DIAGNOSIS — M069 Rheumatoid arthritis, unspecified: Secondary | ICD-10-CM

## 2024-09-07 DIAGNOSIS — J449 Chronic obstructive pulmonary disease, unspecified: Secondary | ICD-10-CM | POA: Diagnosis not present

## 2024-09-07 DIAGNOSIS — R0902 Hypoxemia: Secondary | ICD-10-CM | POA: Diagnosis not present

## 2024-09-07 DIAGNOSIS — G4734 Idiopathic sleep related nonobstructive alveolar hypoventilation: Secondary | ICD-10-CM

## 2024-09-07 NOTE — Progress Notes (Signed)
 "  Subjective:    Patient ID: Gabrielle Pineda, female    DOB: 07/21/1956, 69 y.o.   MRN: [id number]  Patient Care Team: [name], DO as PCP - General (Family Medicine) Perla Evalene PARAS, MD as PCP - Cardiology (Cardiology) Coby Height, MD as Consulting Physician (Psychiatry) Marcelino Nurse, MD as Consulting Physician (Pain Medicine) Marsh, Allyson E, RPH-CPP as Pharmacist  Chief Complaint  Patient presents with   COPD    No breathing problems.     BACKGROUND/INTERVAL:This is a 69 year old female, former smoker quit in 2022 (80 PY), initially seen for pulmonary consult December 11, 2021 for shortness of breath and COPD, abnormal CT chest with lung nodularity.  She has rheumatoid arthritis and is maintained on Arava .  She presents today for follow-up.  Last seen here on 02 May 2024 by me.This is a scheduled visit.  She is enrolled in lung cancer screening.  HPI Discussed the use of AI scribe software for clinical note transcription with the patient, who gave verbal consent to proceed.  History of Present Illness   Gabrielle Pineda is a 69 year old female with COPD and rheumatoid arthritis who presents for routine follow-up.  Her breathing is stable, and she is currently using Breztri , which continues to be effective. She had a lung scan in November for lung cancer screening, and she recalls being told that it looked good.  She has a history of rheumatoid arthritis and is under the care of her rheumatologist. There are no recent joint problems or changes in her condition.  She experiences reflux and has a small hiatal hernia. She is taking medication for reflux, although the specific medication is not mentioned.     I independently reviewed the lung cancer screening CT from 07 July 2024 and showed the films to the patient.  No suspicious pulmonary nodules were noted.  Emphysema as previous.  Overall study is lung RADS 2.  Patient has nocturnal hypoxemia related to  emphysema.  She is compliant with oxygen  and notes benefit of the therapy.  THERAPIES 07/27/2022 through 12/30/2022: Pulmonary rehab.  Patient experienced significant improvement in shortness of breath.   DATA 07/29/2017 simple spirometry: Performed for disability determination FEV1 2.26 L or 79% predicted FVC 3.05 L or 86% predicted FEV1/FVC 74% 11/01/2020 [facility]): Normal left ventricle, LVEF over 55%, right ventricle normal in size with normal systolic function, mild tricuspid regurgitation, no pulmonary hypertension, no wall motion abnormalities 12/18/2021 CT high-res chest: No evidence of fibrotic interstitial lung disease, moderate emphysema and diffuse bronchial wall thickening, mild tracheobronchomalacia, irregular nodules of the left upper lobe and dependent right lower lobe measuring 8 mm, nonspecific.  Fine centrilobular nodularity concentrated in the lung bases consistent with smoking-related respiratory bronchiolitis, coronary artery disease 02/04/2022 PFTs: FEV1 1.45 L or 49% predicted, FVC 2.24 L or 59% predicted, FEV1/FVC 65%, lung volumes normal with modest air trapping,moderately to severe diffusion defect.  Flow volume loop consistent with airway obstruction, no evidence of fixed or variable upper airway obstruction.  There is significant decline in lung function when compared to the 2018 simple spirometry measurements 02/13/2022 overnight oximetry: Show desaturations of less than 88% for over 2 hours patient qualified for nocturnal oxygen  06/29/2022 CT chest: Previously noted left upper lobe and posterior right lower lobe pulmonary nodules resolved.  Tiny 3 mm anterior upper lobe pulmonary nodule suggest follow-up CT chest in 12 to 18 months.  Moderate centrilobular emphysema with mild diffuse bronchial wall thickening compatible with COPD 06/02/2023  echocardiogram: LVEF 60 to 65%, mild LVH.  Grade 1 DD.  Normal right ventricular function.  Mild mitral regurgitation, no other  abnormalities 09/21/2023 PFTs: FEV1 1.57 L or 55% predicted, FVC 2.48 L or 66% predicted, FEV1/FVC 63%, lung volumes show mild air trapping otherwise normal.  Diffusion capacity moderately to severely reduced.  Compared to prior PFTs there has been a modest improvement in FEV1 and FVC 07/07/2024 LDCT chest: Centrilobular and paraseptal emphysema, 4.7 mm anterior left upper lobe nodule, no suspicious pulmonary nodules.  No pleural fluid.  Minimal debris in the airway.  Lung RADS 2.  Review of Systems A 10 point review of systems was performed and it is as noted above otherwise negative.   Patient Active Problem List   Diagnosis Date Noted   Cervical spinal stenosis due to adjacent segment disease after fusion procedure 09/04/2024   Lumbar foraminal stenosis 07/18/2024   Medication management 07/18/2024   Skin ulcer of left foot including toes, with unspecified severity (HCC) 07/03/2024   Osteopenia 07/03/2024   Chronic respiratory failure with hypoxia, on home oxygen  therapy (HCC) 06/26/2024   Spondylolisthesis of lumbar region 05/23/2024   S/P lumbar fusion 05/23/2024   Spondylolisthesis at L4-L5 level 05/03/2024   Chronic kidney disease, stage 3b (HCC) 11/26/2023   Chronic rhinitis 11/26/2023   Battery end of life of spinal cord stimulator 11/08/2023   Viral upper respiratory tract infection 07/29/2023   Nicotine  dependence, cigarettes, in remission 07/29/2023   Mitral valve stenosis 07/29/2023   Nocturnal hypoxemia due to obstructive chronic bronchitis (HCC) 05/10/2023   Memory loss or impairment 04/22/2023   Localized edema 11/23/2022   MDD (major depressive disorder), recurrent, in full remission 03/31/2022   Macrocytic anemia 02/20/2022   Overweight (BMI 25.0-29.9) 02/20/2022   COPD (chronic obstructive pulmonary disease) (HCC) 02/06/2022   Lung nodules 02/06/2022   Allergic rhinitis 02/06/2022   GAD (generalized anxiety disorder) 12/26/2021   Alcohol use disorder, moderate, in  sustained remission (HCC) 12/26/2021   Need for vaccination against Streptococcus pneumoniae 11/17/2021   Annual physical exam 11/17/2021   Sleep disorder 10/23/2021   Iron deficiency anemia 06/26/2021   Status post total hip replacement, right 04/15/2021   Chronic radicular lumbar pain 10/03/2020   Postlaminectomy syndrome, lumbar region 06/03/2020   Spondylosis, cervical, with myelopathy 09/21/2019   DDD (degenerative disc disease), cervical 09/21/2019   Cervicalgia 09/21/2019   Cervical fusion syndrome 09/21/2019   Chronic pain syndrome 09/21/2019   Spinal stenosis, lumbar region, with neurogenic claudication 08/15/2019   History of adenomatous polyp of colon 11/04/2017   Cervical radiculopathy 08/02/2017   Neuropathy 08/02/2017   Essential hypertension 07/28/2017   Closed compression fracture of L5 vertebra (HCC) 07/07/2017   Sacral insufficiency fracture with routine healing 07/07/2017   Family history of malignant neoplasm of pancreas 04/03/2015   Hypercholesteremia 04/03/2015   Disorder of iron metabolism 04/03/2015   Carpal tunnel syndrome 03/29/2015   Acid reflux 10/11/2014   Closed fracture of distal phalanx of thumb 08/15/2014   Arthritis, degenerative 01/30/2014   Arthritis or polyarthritis, rheumatoid (HCC) 01/30/2014   Paroxysmal supraventricular tachycardia 09/22/2010   B-complex deficiency 09/09/2007   Constipation 06/26/2007   Clinical depression 06/26/2007   Cold sore 06/26/2007   H/O alcohol abuse 06/26/2007   Cannot sleep 06/26/2007   Localized osteoarthrosis, hand 06/26/2007   Menopausal symptom 06/26/2007    Social History   Tobacco Use   Smoking status: Former    Current packs/day: 0.00    Average packs/day: 2.0 packs/day  for 45.0 years (90.0 ttl pk-yrs)    Types: Cigarettes    Start date: 10/29/1975    Quit date: 10/28/2020    Years since quitting: 3.8   Smokeless tobacco: Never   Tobacco comments:    Quit smoking in 2022- khj 09/24/2023   Substance Use Topics   Alcohol use: No    Comment: recovering alcoholic Since 1994     Allergies[1]  Active Medications[2]  Immunization History  Administered Date(s) Administered   Fluad Quad(high Dose 65+) 10/01/2021, 05/14/2022   Fluad Trivalent(High Dose 65+) 08/25/2023   INFLUENZA, HIGH DOSE SEASONAL PF 05/02/2024   Influenza Split 09/07/2012   Influenza,inj,Quad PF,6+ Mos 11/18/2015, 07/23/2016, 07/02/2017, 04/14/2018, 04/17/2019, 04/19/2020   Influenza-Unspecified 09/24/2014   PFIZER(Purple Top)SARS-COV-2 Vaccination 11/21/2019, 12/12/2019   PNEUMOCOCCAL CONJUGATE-20 11/17/2021   Pneumococcal Polysaccharide-23 02/12/2011, 09/24/2014, 04/19/2020   Tdap 02/12/2011, 11/15/2015        Objective:     Vitals:   09/07/24 0909  BP: 96/64  Pulse: 71  Temp: 97.7 F (36.5 C)  Height: 5' 7 (1.702 m)  Weight: 168 lb (76.2 kg)  SpO2: 98%  TempSrc: Temporal  BMI (Calculated): 26.31     SpO2: 98 %  GENERAL: Well-developed, overweight woman, no acute distress.  No use of accessory use of respiration.  Fully ambulatory.  No conversational dyspnea. HEAD: Normocephalic, atraumatic.  EYES: Pupils equal, round, reactive to light.  No scleral icterus.  MOUTH: Poor dentition, oral mucosa moist.  No thrush. NECK: Supple. No thyromegaly. Trachea midline. No JVD.  No adenopathy. PULMONARY: Good air entry bilaterally.  Coarse, otherwise no adventitious sounds. CARDIOVASCULAR: S1 and S2. Regular rate and rhythm.  Grade 2/6 systolic ejection murmur left sternal border.  This appears to be new. ABDOMEN: Benign. MUSCULOSKELETAL: Stigmata of rheumatoid arthritis on both hands, no clubbing, no edema.  NEUROLOGIC: No overt focal deficit.  No gait disturbance.  Speech is fluent. SKIN: Intact,warm,dry. PSYCH: Mood and behavior normal.      Assessment & Plan:     ICD-10-CM   1. Stage 3 severe COPD by GOLD classification (HCC)  J44.9     2. Nocturnal hypoxia  G47.34     3.  Rheumatoid arthritis involving multiple joints (HCC)  M06.9     4. Former cigarette smoker  Z87.891      Discussion:    Chronic obstructive pulmonary disease COPD is well-compensated with Breztri . Lung scan from November showed emphysema without significant scarring or suspicious findings. No current respiratory issues reported. - Continue Breztri  for COPD management - Scheduled follow-up in six months     Nocturnal hypoxemia - Continue supplemental oxygen  at 2 L/min nocturnally  Rheumatoid arthritis This issue adds complexity to her management, follows with Dr. Tobie in this regard.   Advised if symptoms do not improve or worsen, to please contact office for sooner follow up or seek emergency care.    I spent 34 minutes of dedicated to the care of this patient on the date of this encounter to include pre-visit review of records, face-to-face time with the patient discussing conditions above, post visit ordering of testing, clinical documentation with the electronic health record, making appropriate referrals as documented, and communicating necessary findings to members of the patients care team.     C. Leita Sanders, MD Advanced Bronchoscopy PCCM [facility]    *This note was generated using voice recognition software/Dragon and/or AI transcription program.  Despite best efforts to proofread, errors can occur which can change the meaning. Any transcriptional  errors that result from this process are unintentional and may not be fully corrected at the time of dictation.    [1]  Allergies Allergen Reactions   Sulfasalazine Other (See Comments)    Does not remember reaction    Plaquenil [Hydroxychloroquine] Rash  [2]  Current Meds  Medication Sig   acetaminophen  (TYLENOL ) 500 MG tablet Take 2 tablets (1,000 mg total) by mouth every 6 (six) hours.   acyclovir  ointment (ZOVIRAX ) 5 % :Topical Every 3 Hours as needed for lesion   albuterol  (VENTOLIN  HFA) 108  (90 Base) MCG/ACT inhaler Inhale 2 puffs into the lungs every 6 (six) hours as needed for shortness of breath.   atorvastatin  (LIPITOR) 40 MG tablet Take 1 tablet (40 mg total) by mouth daily.   budesonide -glycopyrrolate -formoterol  (BREZTRI  AEROSPHERE) 160-9-4.8 MCG/ACT AERO inhaler Inhale 2 puffs into the lungs in the morning and at bedtime.   carvedilol  (COREG ) 12.5 MG tablet TAKE ONE TABLET BY MOUTH AT BREAKFAST AND AT BEDTIME   ezetimibe  (ZETIA ) 10 MG tablet Take 1 tablet (10 mg total) by mouth daily.   ferrous sulfate  325 (65 FE) MG tablet Take 325 mg by mouth daily with breakfast.   fexofenadine (ALLEGRA) 180 MG tablet Take 180 mg by mouth daily.   furosemide  (LASIX ) 20 MG tablet TAKE 1 TABLET BY MOUTH ONCE DAILY   gabapentin  (NEURONTIN ) 400 MG capsule Take 1 capsule (400 mg total) by mouth at bedtime.   ipratropium (ATROVENT ) 0.03 % nasal spray Place 2 sprays into both nostrils 3 (three) times daily as needed for rhinitis.   leflunomide  (ARAVA ) 20 MG tablet Take 20 mg by mouth at bedtime.   losartan  (COZAAR ) 25 MG tablet Take 1 tablet (25 mg total) by mouth daily.   meloxicam  (MOBIC ) 15 MG tablet Take 15 mg by mouth at bedtime.   mirtazapine  (REMERON ) 15 MG tablet TAKE 1 TABLET BY MOUTH EVERY DAY AT BEDTIME   Misc Natural Products (BRAINSTRONG MEMORY SUPPORT PO) Take 2 capsules by mouth daily.   montelukast  (SINGULAIR ) 10 MG tablet TAKE 1 TABLET BY MOUTH EVERY DAY AT BEDTIME   Multiple Vitamin (MULTIVITAMIN WITH MINERALS) TABS tablet Take 1 tablet by mouth daily.   mupirocin  ointment (BACTROBAN ) 2 % Apply 1 Application topically 2 (two) times daily.   Nebulizer MISC Compressor nebulizer to use with nebulized medications as instructed   oxyCODONE -acetaminophen  (PERCOCET) 5-325 MG tablet Take 1 tablet by mouth every 12 (twelve) hours as needed for severe pain (pain score 7-10). Must last 30 days.   [START ON 09/15/2024] oxyCODONE -acetaminophen  (PERCOCET) 5-325 MG tablet Take 1 tablet by mouth  every 12 (twelve) hours as needed for severe pain (pain score 7-10). Must last 30 days.   [START ON 10/15/2024] oxyCODONE -acetaminophen  (PERCOCET) 5-325 MG tablet Take 1 tablet by mouth every 12 (twelve) hours as needed for severe pain (pain score 7-10). Must last 30 days.   pantoprazole  (PROTONIX ) 40 MG tablet Take 40 mg by mouth 2 (two) times daily.   polyethylene glycol powder (GLYCOLAX /MIRALAX ) 17 GM/SCOOP powder Take 17 g by mouth daily as needed for mild constipation. Dissolve 1 capful (17g) in 4-8 ounces of liquid and take by mouth daily.   potassium chloride  SA (KLOR-CON  M) 20 MEQ tablet TAKE 1 TABLET BY MOUTH EVERY MORNING   senna (SENOKOT) 8.6 MG TABS tablet Take 1 tablet (8.6 mg total) by mouth 2 (two) times daily.   topiramate  (TOPAMAX ) 25 MG tablet Take 1 tablet (25 mg total) by mouth 2 (two) times daily.  valACYclovir  (VALTREX ) 1000 MG tablet Take 2 tablets daily for 5 days as needed for cold sores.   venlafaxine  XR (EFFEXOR -XR) 75 MG 24 hr capsule TAKE 2 CAPSULES BY MOUTH DAILY WITH BREAKFAST *DOSE CHANGE*   XIIDRA  5 % SOLN Place 1 drop into both eyes daily as needed (dry eyes).   "

## 2024-09-07 NOTE — Patient Instructions (Signed)
 VISIT SUMMARY:  Today, you came in for a routine follow-up appointment. Your breathing is stable, and your current medication, Breztri , is working well for your COPD. Your lung scan from November showed mild emphysema but no significant issues. Your rheumatoid arthritis is being managed by your rheumatologist, and there have been no recent changes or problems with your joints. You also have reflux and a small hiatal hernia, for which you are taking medication.  YOUR PLAN:  -CHRONIC OBSTRUCTIVE PULMONARY DISEASE (COPD): COPD is a chronic lung condition that makes it hard to breathe. Your COPD is well-managed with Breztri , and your recent lung scan showed mild emphysema without significant issues. Continue taking Breztri  as prescribed. We will schedule a follow-up appointment in six months.  INSTRUCTIONS:  Please continue taking Breztri  as prescribed for your COPD. We will see you again in six months for a follow-up appointment.

## 2024-09-08 ENCOUNTER — Encounter: Payer: Self-pay | Admitting: Pulmonary Disease

## 2024-09-09 ENCOUNTER — Ambulatory Visit: Payer: Self-pay | Admitting: Neurosurgery

## 2024-09-09 NOTE — Progress Notes (Signed)
 At C2-3 there is worsening of the spondylolisthesis on flexion/extension xrays. This is in the setting of stenosis at C2-3 with signs of worsening myelopathy would consider decompression and stabilization.

## 2024-09-12 ENCOUNTER — Telehealth: Admitting: Neurosurgery

## 2024-09-12 DIAGNOSIS — M4712 Other spondylosis with myelopathy, cervical region: Secondary | ICD-10-CM

## 2024-09-12 DIAGNOSIS — M532X2 Spinal instabilities, cervical region: Secondary | ICD-10-CM | POA: Insufficient documentation

## 2024-09-12 DIAGNOSIS — M5030 Other cervical disc degeneration, unspecified cervical region: Secondary | ICD-10-CM

## 2024-09-12 DIAGNOSIS — M4802 Spinal stenosis, cervical region: Secondary | ICD-10-CM

## 2024-09-12 NOTE — Telephone Encounter (Signed)
 I reached out to Gabrielle Pineda to discuss her x-ray findings.  They demonstrate evidence of instability above her previous fusion consistent with adjacent segment disease.  I explained to her that this is likely the reason for her worsening myelopathy.  In order to treat it we would likely need to perform a decompression at that level and a posterior fusion.  She would like to come in to talk about this in person.  I will have my team reach out to her.

## 2024-09-20 NOTE — Telephone Encounter (Signed)
 FYI.SABRASABRASABRASABRACalled patient and advised her of the provider's recommendations to go to the ED, have an appointment with [facility] or GI.  She stated that she had not went to either.  She states that it isn't as much as it was back when she called into the office, she did want to make an appointment with her provider however she wanted to see Dr.  SHAUNNA the earliest that she had was 10/10/2024 so she took that appointment.  She did make the statement that she does not believe it has anything to do with her hemorrhoids.

## 2024-09-27 ENCOUNTER — Ambulatory Visit: Admitting: Podiatry

## 2024-09-27 ENCOUNTER — Ambulatory Visit

## 2024-09-29 ENCOUNTER — Ambulatory Visit: Admitting: Pulmonary Disease

## 2024-10-17 ENCOUNTER — Encounter: Admitting: Nurse Practitioner

## 2024-11-13 ENCOUNTER — Ambulatory Visit: Admitting: Psychiatry

## 2024-11-23 ENCOUNTER — Ambulatory Visit: Admitting: Podiatry

## 2024-11-27 ENCOUNTER — Ambulatory Visit: Admitting: Family Medicine

## 2024-11-30 ENCOUNTER — Ambulatory Visit: Admitting: Family Medicine

## 2025-03-09 ENCOUNTER — Encounter

## 2025-03-13 ENCOUNTER — Ambulatory Visit
